# Patient Record
Sex: Male | Born: 1935 | Race: White | Hispanic: No | Marital: Married | Smoking: Never smoker
Health system: Southern US, Community
[De-identification: ages and names within clinical notes are randomized; demographics above are authoritative.]

## PROBLEM LIST (undated history)

## (undated) DIAGNOSIS — R7989 Other specified abnormal findings of blood chemistry: Secondary | ICD-10-CM

## (undated) DIAGNOSIS — I5030 Unspecified diastolic (congestive) heart failure: Secondary | ICD-10-CM

## (undated) DIAGNOSIS — D126 Benign neoplasm of colon, unspecified: Secondary | ICD-10-CM

## (undated) DIAGNOSIS — I509 Heart failure, unspecified: Secondary | ICD-10-CM

## (undated) DIAGNOSIS — N529 Male erectile dysfunction, unspecified: Secondary | ICD-10-CM

## (undated) DIAGNOSIS — E785 Hyperlipidemia, unspecified: Secondary | ICD-10-CM

## (undated) DIAGNOSIS — I10 Essential (primary) hypertension: Secondary | ICD-10-CM

## (undated) DIAGNOSIS — I5189 Other ill-defined heart diseases: Secondary | ICD-10-CM

## (undated) DIAGNOSIS — H269 Unspecified cataract: Secondary | ICD-10-CM

## (undated) DIAGNOSIS — K604 Rectal fistula, unspecified: Secondary | ICD-10-CM

## (undated) DIAGNOSIS — I499 Cardiac arrhythmia, unspecified: Secondary | ICD-10-CM

## (undated) DIAGNOSIS — I2721 Secondary pulmonary arterial hypertension: Secondary | ICD-10-CM

## (undated) DIAGNOSIS — I2510 Atherosclerotic heart disease of native coronary artery without angina pectoris: Secondary | ICD-10-CM

## (undated) DIAGNOSIS — Z794 Long term (current) use of insulin: Secondary | ICD-10-CM

## (undated) DIAGNOSIS — R0600 Dyspnea, unspecified: Secondary | ICD-10-CM

## (undated) DIAGNOSIS — J189 Pneumonia, unspecified organism: Secondary | ICD-10-CM

## (undated) DIAGNOSIS — M1990 Unspecified osteoarthritis, unspecified site: Secondary | ICD-10-CM

## (undated) DIAGNOSIS — N400 Enlarged prostate without lower urinary tract symptoms: Secondary | ICD-10-CM

## (undated) DIAGNOSIS — C801 Malignant (primary) neoplasm, unspecified: Secondary | ICD-10-CM

## (undated) DIAGNOSIS — R42 Dizziness and giddiness: Secondary | ICD-10-CM

## (undated) DIAGNOSIS — E119 Type 2 diabetes mellitus without complications: Secondary | ICD-10-CM

## (undated) DIAGNOSIS — U071 COVID-19: Secondary | ICD-10-CM

## (undated) DIAGNOSIS — K8050 Calculus of bile duct without cholangitis or cholecystitis without obstruction: Secondary | ICD-10-CM

## (undated) DIAGNOSIS — K219 Gastro-esophageal reflux disease without esophagitis: Secondary | ICD-10-CM

## (undated) HISTORY — DX: Other ill-defined heart diseases: I51.89

## (undated) HISTORY — DX: Hyperlipidemia, unspecified: E78.5

## (undated) HISTORY — DX: Benign neoplasm of colon, unspecified: D12.6

## (undated) HISTORY — DX: Unspecified diastolic (congestive) heart failure: I50.30

## (undated) HISTORY — DX: COVID-19: U07.1

## (undated) HISTORY — DX: Gastro-esophageal reflux disease without esophagitis: K21.9

## (undated) HISTORY — DX: Rectal fistula, unspecified: K60.40

## (undated) HISTORY — DX: Type 2 diabetes mellitus without complications: E11.9

## (undated) HISTORY — DX: Male erectile dysfunction, unspecified: N52.9

## (undated) HISTORY — PX: EYE SURGERY: SHX253

## (undated) HISTORY — DX: Rectal fistula: K60.4

## (undated) HISTORY — DX: Calculus of bile duct without cholangitis or cholecystitis without obstruction: K80.50

## (undated) HISTORY — DX: Long term (current) use of insulin: Z79.4

## (undated) HISTORY — DX: Unspecified cataract: H26.9

## (undated) HISTORY — DX: Essential (primary) hypertension: I10

## (undated) HISTORY — PX: BACK SURGERY: SHX140

## (undated) HISTORY — PX: COLONOSCOPY W/ POLYPECTOMY: SHX1380

## (undated) HISTORY — DX: Atherosclerotic heart disease of native coronary artery without angina pectoris: I25.10

## (undated) HISTORY — DX: Secondary pulmonary arterial hypertension: I27.21

## (undated) HISTORY — DX: Other specified abnormal findings of blood chemistry: R79.89

## (undated) HISTORY — DX: Unspecified osteoarthritis, unspecified site: M19.90

---

## 1987-03-08 HISTORY — PX: LUMBAR LAMINECTOMY: SHX95

## 1998-03-07 DIAGNOSIS — IMO0001 Reserved for inherently not codable concepts without codable children: Secondary | ICD-10-CM

## 1998-03-07 HISTORY — DX: Reserved for inherently not codable concepts without codable children: IMO0001

## 2005-01-05 ENCOUNTER — Inpatient Hospital Stay (HOSPITAL_COMMUNITY): Admission: RE | Admit: 2005-01-05 | Discharge: 2005-01-18 | Payer: Self-pay | Admitting: *Deleted

## 2005-03-07 HISTORY — PX: CORONARY ARTERY BYPASS GRAFT: SHX141

## 2006-05-23 ENCOUNTER — Ambulatory Visit: Payer: Self-pay

## 2006-08-28 ENCOUNTER — Ambulatory Visit: Payer: Self-pay | Admitting: Otolaryngology

## 2007-05-24 ENCOUNTER — Encounter: Payer: Self-pay | Admitting: Cardiovascular Disease

## 2007-05-24 ENCOUNTER — Ambulatory Visit (HOSPITAL_COMMUNITY): Admission: RE | Admit: 2007-05-24 | Discharge: 2007-05-24 | Payer: Self-pay | Admitting: Cardiovascular Disease

## 2007-07-05 ENCOUNTER — Encounter: Payer: Self-pay | Admitting: Cardiovascular Disease

## 2007-07-24 ENCOUNTER — Ambulatory Visit: Payer: Self-pay | Admitting: Cardiovascular Disease

## 2007-11-02 ENCOUNTER — Ambulatory Visit: Payer: Self-pay | Admitting: Cardiology

## 2008-02-07 ENCOUNTER — Encounter: Payer: Self-pay | Admitting: Cardiovascular Disease

## 2008-10-05 HISTORY — PX: JOINT REPLACEMENT: SHX530

## 2009-03-20 ENCOUNTER — Encounter: Payer: Self-pay | Admitting: Cardiovascular Disease

## 2009-09-09 ENCOUNTER — Encounter: Payer: Self-pay | Admitting: Cardiovascular Disease

## 2009-09-15 ENCOUNTER — Ambulatory Visit: Payer: Self-pay | Admitting: Cardiovascular Disease

## 2009-09-15 DIAGNOSIS — E785 Hyperlipidemia, unspecified: Secondary | ICD-10-CM | POA: Insufficient documentation

## 2009-09-15 DIAGNOSIS — I10 Essential (primary) hypertension: Secondary | ICD-10-CM | POA: Insufficient documentation

## 2009-09-15 DIAGNOSIS — I2510 Atherosclerotic heart disease of native coronary artery without angina pectoris: Secondary | ICD-10-CM | POA: Insufficient documentation

## 2009-09-22 ENCOUNTER — Telehealth: Payer: Self-pay | Admitting: Cardiovascular Disease

## 2009-09-23 ENCOUNTER — Telehealth: Payer: Self-pay | Admitting: Cardiovascular Disease

## 2010-03-07 HISTORY — PX: FOOT SURGERY: SHX648

## 2010-03-22 ENCOUNTER — Ambulatory Visit
Admission: RE | Admit: 2010-03-22 | Discharge: 2010-03-22 | Payer: Self-pay | Source: Home / Self Care | Attending: Cardiovascular Disease | Admitting: Cardiovascular Disease

## 2010-03-22 ENCOUNTER — Encounter: Payer: Self-pay | Admitting: Cardiovascular Disease

## 2010-03-27 ENCOUNTER — Encounter: Payer: Self-pay | Admitting: Surgery

## 2010-03-28 ENCOUNTER — Encounter: Payer: Self-pay | Admitting: *Deleted

## 2010-04-02 ENCOUNTER — Ambulatory Visit: Payer: Self-pay

## 2010-04-07 NOTE — Assessment & Plan Note (Signed)
Summary: NEW PT   Visit Type:  Initial Consult Primary Provider:  Nada Maclachlan.  CC:  c/o shortness of breath with little exertion.[name] [name]  History of Present Illness: 77 year old gentleman, known to me from [facility], with a history of coronary artery disease, bypass surgery in November 2006, history of diabetes, hyperlipidemia, hypertension, erectile dysfunction who presents to reestablish care.  He was last seen by myself in January 2011. At that time he was doing well. He reports that he continues to be happy, has no complaints. He denies any chest pain. He does report having some shortness of breath when he walks up hills and stairs. His fianc who is with him states that he does not do any exercise and sits around too much. He states that he likes to bike. His sugars have been better controlled since he started insulin subcutaneous.  Echocardiogram April 2009 shows normal systolic function with mild LVH, diastolic relaxation abnormality, mildly enlarged left atrium, mild aortic insufficiency.  Cardiac CT scan in March 2009 showed severe coronary artery disease with patent grafts x4, saphenous vein graft to the PDA, OM, diagonal and a LIMA graft to the LAD.  Labs from May 2011 shows total cholesterol 138, LDL 69, HDL 32, hemoglobin A1c 6.6  EKG shows normal sinus rhythm with rate 56 beats per minute, no significant ST or T wave changes.  Preventive Screening-Counseling & Management  Alcohol-Tobacco     Smoking Status: never  Caffeine-Diet-Exercise     Does Patient Exercise: no      Drug Use:  yes.    Current Medications (verified): 1)  Diclofenac Sodium 75 Mg Tbec (Diclofenac Sodium) .[name] [name].. 1 Two Times A Day 2)  Ramipril 10 Mg Caps (Ramipril) .[name] [name].. 1 Once Daily 3)  Omeprazole 20 Mg Cpdr (Omeprazole) .[name] [name].. 1 Once Daily 4)  Simvastatin 80 Mg Tabs (Simvastatin) .[name] [name].. 1 At Bedtime 5)  Carvedilol 12.5 Mg Tabs (Carvedilol) .[name] [name].. 1 Two Times A Day 6)   Aspir-Low 81 Mg Tbec (Aspirin) .... Two Tablets Once Daily 7)  Vitamin C 8)  Vitamin D 1000mg  .... 1 Once Daily 9)  Fish Oil 1000 Mg Caps (Omega-3 Fatty Acids) .[name] [name].. 1 Once Daily 10)  Osteo Bi-Flex Regular Strength 250-200 Mg Tabs (Glucosamine-Chondroitin) .[name] [name].. 1 Once Daily 11)  Centrum Silver  Tabs (Multiple Vitamins-Minerals) .[name] [name].. 1 Once Daily 12)  Saw Palmetto 450 Mg Caps (Saw Palmetto (Serenoa Repens)) .[name] [name].. 1 Once Daily  Allergies (verified): No Known Drug Allergies  Past History:  Family History: Last updated: 09/15/2009 Family History of Coronary Artery Disease:  Family History of Cancer:   Social History: Last updated: 09/15/2009 Part Time  Single  Tobacco Use - No.  Alcohol Use - yes Drug Use - no Drug Use - yes  Risk Factors: Exercise: no (09/15/2009)  Risk Factors: Smoking Status: never (09/15/2009)  Past Medical History: CAD Hyperlipidemia Hypertension  Past Surgical History: UEA:VWUJ x 4: 2007  Family History: Family History of Coronary Artery Disease:  Family History of Cancer:   Social History: Part Time  Single  Tobacco Use - No.  Alcohol Use - yes Drug Use - no Drug Use - yes Smoking Status:  never Does Patient Exercise:  no Drug Use:  yes  Review of Systems       The patient complains of shortness of breath.  The patient denies fever, weight gain/loss, vision loss, decreased hearing, hoarseness, chest pain, palpitations, prolonged cough, abdominal pain, incontinence, muscle weakness, depression, and enlarged lymph nodes.  Vital Signs:  Patient profile:   77 year old male Height:      74 inches Weight:      226 pounds BMI:     29.12 Pulse rate:   58 / minute BP sitting:   181 / 81  (left arm) Cuff size:   large  Vitals Entered By: Bishop Dublin, CMA (September 15, 2009 3:17 PM)  Physical Exam  General:  Well developed, well nourished, in no acute distress.Moderate obesity Head:  normocephalic and atraumatic Neck:  Neck supple, no  JVD. No masses, thyromegaly or abnormal cervical nodes. Lungs:  Clear bilaterally to auscultation and percussion. Heart:  Non-displaced PMI, chest non-tender; regular rate and rhythm, S1, S2 without murmurs, rubs or gallops. Carotid upstroke normal, no bruit.  Pedals normal pulses. No edema, no varicosities. Abdomen:  Bowel sounds positive; abdomen soft and non-tender without masses,  Msk:  Back normal, normal gait. Muscle strength and tone normal. Neurologic:  Alert and oriented x 3. Skin:  Intact without lesions or rashes. Psych:  Normal affect.   Impression & Recommendations:  Problem # 1:  CAD (ICD-414.00) history of severe coronary artery disease with bypass grafting. Asymptomatic over the past 5 years. He does have some shortness of breath with exertion and I suspect this is due to being very deconditioned. Rest and increase his biking. He does have bad feet and is unable to walk her treadmill for long distances.  His updated medication list for this problem includes:    Ramipril 10 Mg Caps (Ramipril) .[name] [name]... 1 once daily    Carvedilol 12.5 Mg Tabs (Carvedilol) .[name] [name]... 1 two times a day    Aspir-low 81 Mg Tbec (Aspirin) .[name] [name]..[name] [name] Two tablets once daily  Orders: EKG w/ Interpretation (93000)  Problem # 2:  HYPERLIPIDEMIA-MIXED (ICD-272.4) cholesterol is well controlled on his current dose of simvastatin 40 mg daily. We have made no changes.  His updated medication list for this problem includes:    Simvastatin 40 Mg Tabs (Simvastatin) .[name] [name]... Take 1 tablet by mouth once a day  Problem # 3:  HYPERTENSION, BENIGN (ICD-401.1) we'll continue to watch his blood pressure. It did come down on repeat check today to 144/70. We has not changed his medicines at this time. He will buy a blood pressure cuff to monitor his blood pressure at home.  His updated medication list for this problem includes:    Ramipril 10 Mg Caps (Ramipril) .[name] [name]... 1 once daily    Carvedilol 12.5 Mg Tabs (Carvedilol)  .[name] [name]... 1 two times a day    Aspir-low 81 Mg Tbec (Aspirin) .[name] [name]..[name] [name] Two tablets once daily  Patient Instructions: 1)  Your physician has recommended you make the following change in your medication: Decrease simvastatin 40mg  daily 2)  Your physician wants you to follow-up in:   6 months You will receive a reminder letter in the mail two months in advance. If you don't receive a letter, please call our office to schedule the follow-up appointment. Prescriptions: SIMVASTATIN 40 MG TABS (SIMVASTATIN) Take 1 tablet by mouth once a day  #30 x 12   Entered by:   Benedict Needy, RN   Authorized by:   Dossie Arbour MD   Signed by:   Benedict Needy, RN on 09/15/2009   Method used:   Electronically to        [facility] [address] [phone]* (retail)       [address]       [place], [place]  [address]  Ph: [phone]       Fax: [phone]   RxID:   [id number] CARVEDILOL 12.5 MG TABS (CARVEDILOL) 1 two times a day  #60 x 12   Entered by:   Benedict Needy, RN   Authorized by:   Dossie Arbour MD   Signed by:   Benedict Needy, RN on 09/15/2009   Method used:   Electronically to        [facility] [address] (retail)       [address]       [place], [place]  [address]       Ph: [phone]       Fax: [phone]   RxID:   [id number]

## 2010-04-07 NOTE — Progress Notes (Signed)
Summary: pre op  ---- Converted from flag ---- ---- 09/23/2009 9:29 AM, [name] Needy, RN wrote: pt having prostate surgery @ [facility] seen in office 7/12 can he be cleared for surgery. ------------------------------  Appended Document: pre op He can be cleared for prostate surgery based on recent visit and exam. [name]

## 2010-04-07 NOTE — Letter (Signed)
Summary: External Correspondence  External Correspondence   Imported By: West Carbo 09/09/2009 13:57:51  _____________________________________________________________________  External Attachment:    Type:   Image     Comment:   External Document

## 2010-04-07 NOTE — Progress Notes (Signed)
Summary: SURGICAL CLEARANCE  Phone Note Call from Patient Call back at Home Phone [phone]   Caller: Patient Call For: [facility] Summary of Call: PATIENT CALLED AND IS HAVING PROSTATE SURGERY AT [facility] BY DR. Mordecai Rasmussen.  HE NEEDS SURGICAL CLEARANCE FROM DR. Karsynn Deweese.  PLEASE FAX CLEARANCE TO [facility]. [facility] AT [phone] Initial call taken by: West Carbo,  September 22, 2009 12:11 PM  Follow-up for Phone Call        clearance faxed  Follow-up by: Benedict Needy, RN,  September 23, 2009 10:00 AM

## 2010-04-07 NOTE — Letter (Signed)
Summary: Historic Patient File  Historic Patient File   Imported By: West Carbo 09/16/2009 08:39:53  _____________________________________________________________________  External Attachment:    Type:   Image     Comment:   External Document

## 2010-04-07 NOTE — Letter (Signed)
Summary: Historic Patient File  Historic Patient File   Imported By: West Carbo 09/15/2009 10:37:56  _____________________________________________________________________  External Attachment:    Type:   Image     Comment:   External Document

## 2010-04-08 NOTE — Assessment & Plan Note (Signed)
Summary: F/U 6 MONTHS   Visit Type:  Follow-up Primary Provider:  Nada Maclachlan.  CC:  c/o shortness of breath with exertion.Marland Kitchen  History of Present Illness: 77 year old gentleman, known to me from [facility], with a history of coronary artery disease, bypass surgery in November 2006, history of diabetes, hyperlipidemia, hypertension, erectile dysfunction who presents to reestablish care.  He reports that he continues to be happy, has no complaints. He denies any chest pain. He does have some very mild joint discomfort and is concerned this could be secondary to statin. He has been moving recently and has been very active. He is trying to get back to his biking. No edema, no significant shortness of breath, no lightheadedness or dizziness.  repeat blood pressure is 137/70. His sugars have been elevated recently despite eating a lot less. He is due to followup with his endocrinologist.  Echocardiogram April 2009 shows normal systolic function with mild LVH, diastolic relaxation abnormality, mildly enlarged left atrium, mild aortic insufficiency.  Cardiac CT scan in March 2009 showed severe coronary artery disease with patent grafts x4, saphenous vein graft to the PDA, OM, diagonal and a LIMA graft to the LAD.  Labs from May 2011 shows total cholesterol 138, LDL 69, HDL 32, hemoglobin A1c 6.6  EKG shows normal sinus rhythm with rate 62 beats per minute, no significant ST or T wave changes.  Current Medications (verified): 1)  Diclofenac Sodium 75 Mg Tbec (Diclofenac Sodium) .Marland Kitchen.. 1 Two Times A Day 2)  Ramipril 10 Mg Caps (Ramipril) .Marland Kitchen.. 1 Once Daily 3)  Omeprazole 20 Mg Cpdr (Omeprazole) .Marland Kitchen.. 1 Once Daily 4)  Simvastatin 40 Mg Tabs (Simvastatin) .... Take 1 Tablet By Mouth Once A Day 5)  Carvedilol 12.5 Mg Tabs (Carvedilol) .Marland Kitchen.. 1 Two Times A Day 6)  Aspir-Low 81 Mg Tbec (Aspirin) .... Two Tablets Once Daily 7)  Vitamin C 8)  Vitamin D3 2000 Unit Caps  (Cholecalciferol) .... One Tablet Once Daily 9)  Fish Oil 1000 Mg Caps (Omega-3 Fatty Acids) .Marland Kitchen.. 1 Once Daily 10)  Osteo Bi-Flex Regular Strength 250-200 Mg Tabs (Glucosamine-Chondroitin) .Marland Kitchen.. 1 Once Daily 11)  Centrum Silver  Tabs (Multiple Vitamins-Minerals) .Marland Kitchen.. 1 Once Daily 12)  Glimepiride 1 Mg Tabs (Glimepiride) .... One Tablet Once Daily 13)  Celebrex 200 Mg Caps (Celecoxib) .... One Tablet Once Daily 14)  Finasteride 5 Mg Tabs (Finasteride) .... One Tablet Once Daily 15)  Levemir 100 Unit/ml Soln (Insulin Detemir) .... 26 Units 16)  Oxybutynin Chloride 5 Mg Tabs (Oxybutynin Chloride) .... One Tablet Qd  Allergies (verified): No Known Drug Allergies  Past History:  Past Medical History: Last updated: 09/15/2009 CAD Hyperlipidemia Hypertension  Past Surgical History: Last updated: 09/15/2009 ZOX:WRUE x 4: 2007  Family History: Last updated: 09/15/2009 Family History of Coronary Artery Disease:  Family History of Cancer:   Social History: Last updated: 09/15/2009 Part Time  Single  Tobacco Use - No.  Alcohol Use - yes Drug Use - no Drug Use - yes  Risk Factors: Exercise: no (09/15/2009)  Risk Factors: Smoking Status: never (09/15/2009)  Review of Systems  The patient denies fever, weight loss, weight gain, vision loss, decreased hearing, hoarseness, chest pain, syncope, dyspnea on exertion, peripheral edema, prolonged cough, abdominal pain, incontinence, muscle weakness, depression, and enlarged lymph nodes.    Vital Signs:  Patient profile:   77 year old male Height:      74 inches Weight:      218 pounds BMI:  28.09 Pulse rate:   61 / minute BP sitting:   157 / 74  (left arm) Cuff size:   large  Vitals Entered By: Lysbeth Galas CMA (March 22, 2010 2:26 PM)  Physical Exam  General:  Well developed, well nourished, in no acute distress.Moderate obesity Head:  normocephalic and atraumatic Neck:  Neck supple, no JVD. No masses, thyromegaly or  abnormal cervical nodes. Lungs:  Clear bilaterally to auscultation and percussion. Heart:  Non-displaced PMI, chest non-tender; regular rate and rhythm, S1, S2 without murmurs, rubs or gallops. Carotid upstroke normal, no bruit.  Pedals normal pulses. No edema, no varicosities. Abdomen:  Bowel sounds positive; abdomen soft and non-tender without masses,  Msk:  Back normal, normal gait. Muscle strength and tone normal. Pulses:  pulses normal in all 4 extremities Extremities:  No clubbing or cyanosis. Neurologic:  Alert and oriented x 3. Skin:  Intact without lesions or rashes. Psych:  Normal affect.   Impression & Recommendations:  Problem # 1:  CAD (ICD-414.00) no symptoms of angina. We will continue aggressive medical management. We have suggested that we check his cholesterol in several months time.  His updated medication list for this problem includes:    Ramipril 10 Mg Caps (Ramipril) .Marland Kitchen... 1 once daily    Carvedilol 12.5 Mg Tabs (Carvedilol) .Marland Kitchen... 1 tablet two times a day    Aspir-low 81 Mg Tbec (Aspirin) .Marland Kitchen..Marland Kitchen Two tablets once daily  Orders: EKG w/ Interpretation (93[address])  Problem # 2:  HYPERLIPIDEMIA-MIXED (ICD-272.4) He was previously at goal LDL less than 70 in May 2011. Continue simvastatin for now. Encouraged weight loss and exercise.  His updated medication list for this problem includes:    Simvastatin 40 Mg Tabs (Simvastatin) .Marland Kitchen... Take 1 tablet by mouth once a day  Problem # 3:  HYPERTENSION, BENIGN (ICD-401.1) Blood pressure did improve to a reasonable level on recheck. No further medication changes made at this time. Encouraged weight loss and exercise.  His updated medication list for this problem includes:    Ramipril 10 Mg Caps (Ramipril) .Marland Kitchen... 1 once daily    Carvedilol 12.5 Mg Tabs (Carvedilol) .Marland Kitchen... 1 tablet two times a day    Aspir-low 81 Mg Tbec (Aspirin) .Marland Kitchen..Marland Kitchen Two tablets once daily  Patient Instructions: 1)  Your physician recommends that you  schedule a follow-up appointment in: 6 months 2)  TO BE SCHEDULED 05/2010: Your physician recommends that you return for a FASTING lipid profile (Lipid/LFT) 3)  Your physician recommends that you continue on your current medications as directed. Please refer to the Current Medication list given to you today. Prescriptions: RAMIPRIL 10 MG CAPS (RAMIPRIL) 1 once daily  #90 x 4   Entered by:   Lanny Hurst RN   Authorized by:   Dossie Arbour MD   Signed by:   Lanny Hurst RN on 03/22/2010   Method used:   Electronically to        [facility] [phone]* (retail)       [address] [place], [place]  [address]       Ph: [phone] or [phone]       Fax: [phone]   RxID:   [id number] SIMVASTATIN 40 MG TABS (SIMVASTATIN) Take 1 tablet by mouth once a day  #90 x 4   Entered by:   Lanny Hurst RN   Authorized by:   Dossie Arbour MD   Signed  by:   Lanny Hurst RN on 03/22/2010   Method used:   Electronically to        [facility] [phone]* (retail)       [address] [place], [place]  [address]       Ph: [phone] or [phone]       Fax: [phone]   RxID:   [id number] CARVEDILOL 12.5 MG TABS (CARVEDILOL) 1 tablet two times a day  #90 x 4   Entered by:   Lanny Hurst RN   Authorized by:   Dossie Arbour MD   Signed by:   Lanny Hurst RN on 03/22/2010   Method used:   Electronically to        [facility] [phone]* (retail)       [address] [place], [place]  [address]       Ph: [phone] or [phone]       Fax: [phone]   RxID:   [id number]

## 2010-04-21 ENCOUNTER — Telehealth: Payer: Self-pay | Admitting: Cardiovascular Disease

## 2010-04-28 NOTE — Progress Notes (Signed)
Summary: Cardiac Clearance  Phone Note Other Incoming   Caller: Orthopedics Elease Hashimoto) Summary of Call: Orthopedics called asking if pt can have cardiac clearance for foot surgery that is scheduled May 18, 2010. Pt was seen last month. Will fax them last EKG per request and will notify if he needs another f/u or if we can send clearance note. Phone: [phone] Fax:  [phone] Initial call taken by: Lanny Hurst RN,  April 21, 2010 5:01 PM  Follow-up for Phone Call        low risk for surgery. OK to give clearance     Appended Document: Cardiac Clearance Clearance faxed. /MES

## 2010-07-20 NOTE — Assessment & Plan Note (Signed)
[facility]                            CARDIOLOGY OFFICE NOTE   NAME:[name] [name], [name] [name]                     MRN:          [id number]  DATE:11/02/2007                            DOB:          Jul 09, 1933    I was asked by [name] [name] to consult on [name] [name] for his  cardiovascular problems since he is to become his new cardiologist.  He  has been a former patient of Dr. Lenise [name], who has moved.   HISTORY OF PRESENT ILLNESS:  [name] [name] is a 77 year old married white  male from [place] whose initial cardiovascular history dates back  into the early 2000s.  At that time, he had a PTCA and stenting of the  LAD and diagonal at [name].  He had a diagonal branch stented with  a drug-eluting stent in 2004.   He subsequently presented with progressive disease.  He underwent  coronary artery bypass grafting by Dr. Evelene [name] on January 12, 2005,  where he had a left internal mammary graft placed to the LAD, vein graft  to the diagonal, vein graft to the third obtuse marginal and the  circumflex, and vein graft to the distal right coronary artery.   His LV function at that time was preserved with 60% EF.   He has done well since that time.  He recently had some shortness of  breath, which he now attributes to Actos.  He had a CTA in March 2009,  which showed patent grafts with severe native disease.  He also had a CT  of the chest, which showed no aortic dissection or pulmonary embolus.   His shortness of breath is baseline, but improved.  He is now off Actos.  His blood sugars have been high.  In fact, they are averaging over 200  in the mornings.   His past medical history in addition to the above, he quit smoking in  1951.  He has 1-2 alcoholic beverages a day.  He rides his bike almost a  week daily.   He has had no other surgeries.   He has no known drug allergies.   His current meds,  1. Meloxicam 7.5 mg a day.  2.  Omeprazole 20 mg a day.  3. Carvedilol 12.5 b.i.d.  4. Plavix 75 mg a day.  5. Ramipril 10 mg a day.  6. Prandin 2 mg t.i.d.  7. Simvastatin 40 mg at bedtime.  8. Avodart 0.5 daily.  9. Multivitamin.  10.Enteric-coated aspirin 81 mg a day.  11.Vitamin C.  12.Glucosamine.  13.Fish oil 2400 mg a day.   Family history is remarkable for his father dying of heart attack at age  60.   SOCIAL HISTORY:  He is retired.  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  He is married and has 4 children.   REVIEW OF SYSTEMS:  He has a history of gastroesophageal reflux, he has  some urinary tract problems with prostate issues, type 2 diabetes of  course, and arthritis.  Rest of the review of systems are negative.   PHYSICAL  EXAMINATION:  GENERAL:  On his exam today, he is a well-  developed, well-nourished overweight male in no acute distress.  VITAL SIGNS:  Blood pressure 124/70, his pulse is 67 and regular, and he  is 6 feet 1 inches, and weighs 223 pounds.  HEENT:  Normocephalic and atraumatic.  PERRLA.  Extraocular movements  intact.  Sclerae clear.  Facial symmetry is normal.  Carotids upstrokes  are equal bilaterally without bruits.  No JVD.  Thyroid is not enlarged.  Trachea is midline.  LUNGS:  Clear to auscultation and percussion.  HEART:  Reveals a poorly appreciated PMI and soft S1 and S2.  No gallop,  rub, or murmur.  Sternotomy site is stable.  ABDOMEN:  Protuberant.  Good bowel sounds.  There is no obvious  pulsatile mass.  Organomegaly was not present.  EXTREMITIES:  No cyanosis, clubbing, or edema.  He has some varicose  veins.  There is no sign of DVT.  Pulses are brisk.  NEURO:  Exam is intact.  SKIN:  Shows few ecchymoses.  MUSCULOSKELETAL:  No gross deformities.  No effusions.   His EKG shows normal sinus rhythm with nonspecific ST-segment changes.   ASSESSMENT/PLAN:  [name] [name] is doing well.  I am very concerned about  his blood sugar not being under control  and I have gone over this at  length today with him.  I have recommended he try to lose about 20  pounds and to follow with [name] [name] about adding another agent  perhaps.  I discouraged his assumption that he could exercise more and  lose weight.   I am also not sure if there is any indication for Plavix at this time.  His drug-eluting stents were put in 2004 plus he has also had bypass  surgery of the diagonals and LAD stents.   He is not sure if he wants to stop the Plavix.  I think, there is no  reason to continue it.   I will plan on seeing him back in 6 months.     [name] C. Daleen Squibb, MD, [facility]  Electronically Signed    TCW/MedQ  DD: 11/02/2007  DT: 11/02/2007  Job #: [id number]   cc:   Julieanne [name]

## 2010-07-23 NOTE — Op Note (Signed)
NAME:  ELIS, SAUBER NO.:  [id number]   MEDICAL RECORD NO.:  [id number]          PATIENT TYPE:  INP   LOCATION:  2399                         FACILITY:  [facility]   PHYSICIAN:  Larina Earthly, M.D.    DATE OF BIRTH:  March 14, 1933   DATE OF PROCEDURE:  01/12/2005  DATE OF DISCHARGE:                                 OPERATIVE REPORT   PREOPERATIVE DIAGNOSIS:  Right femoral false aneurysm.   POSTOP DIAGNOSIS:  Right femoral false aneurysm.   PROCEDURE:  Repair right femoral false aneurysm.   SURGEON:  Larina Earthly, M.D.   ASSISTANT:  Zadie Rhine, PA-C   ANESTHESIA:  General tracheal.   COMPLICATIONS:  None.   DISPOSITION:  The patient will then undergo coronary bypass grafting by Dr.  Evelene Croon, which be dictated as a separate note.   PROCEDURE IN DETAIL:  The patient was taken to the operating room, placed  supine position where the area of both groins, both legs and chest were  prepped for coronary bypass grafting.  Incision was made above the inguinal  ligament on the right and carried down through the subcutaneous tissue to  the level of the femoral artery. The common femoral artery was controlled  proximally and the false aneurysm was entered. The false aneurysm was  controlled with digital pressure and the superficial femoral artery was  exposed distal to the false aneurysm puncture site. The patient had a single  puncture at the junction of the superficial femoral and common femoral  artery. This was initially controlled with digital pressure, then was  controlled with a single 5-0 Prolene figure-of-eight suture. The wound was  copiously irrigated and hemostasis was obtained with cautery.  A flat Blake  drain was brought through separate puncture site and was secured to the skin  with 3-0 nylon stitch. The drain was cut to appropriate length was placed  the level of the femoral artery. The wound was then closed with several  layers of 2-0 Vicryl in  the subcu tissue and skin was closed with 3-0  subcuticular Vicrylk stitch. Sterile dressings applied. The patient was  taken to recovery room in stable condition.      Larina Earthly, M.D.  Electronically Signed     TFE/MEDQ  D:  01/12/2005  T:  01/12/2005  Job:  [id number]

## 2010-07-23 NOTE — Consult Note (Signed)
NAME:  [name] [name], [name] [name] NO.:  [id number]   MEDICAL RECORD NO.:  [id number]          PATIENT TYPE:  INP   LOCATION:  2030                         FACILITY:  [facility]   PHYSICIAN:  Di Kindle. Edilia Bo, M.D.DATE OF BIRTH:  1933/06/16   DATE OF CONSULTATION:  01/11/2005  DATE OF DISCHARGE:                                   CONSULTATION   REASON FOR CONSULTATION:  Right femoral false aneurysm.   HISTORY:  This is a pleasant 77 year old gentleman with a history of  coronary artery disease, status post previous PTCA and stenting in [place] in December 2002.  He had presented with increasing dyspnea on exertion  and was brought in for cardiac catheterization -- which was done on January 05, 2005 via a right femoral approach. He was seen by Dr. Laneta Simmers and  scheduled for coronary revascularization. On exam, he was noted to have some  ecchymosis in the right groin and a bruit was heard on examination. Vascular  surgery was consulted after a duplex scan showed evidence of a femoral pulse  aneurysm on the right with no evidence of AV fistula.   PAST MEDICAL HISTORY:  Significant for coronary artery disease. In addition,  he has a history of hypertension, type 2 diabetes. He had smoked cigars in  the past, but quit 10 years ago. He has no history of hyperlipidemia that he  is aware of. He has no history of renal insufficiency or peripheral vascular  disease that he is aware of. On review of systems, he has had no  claudication, rest pain or nonhealing wounds. He has had no history of  stroke, TIAs or amaurosis fugax.   PHYSICAL EXAMINATION:  VITAL SIGNS:  Blood pressure 131/72, heart rate 76.  ABDOMEN:  Soft and nontender. He has a pulsatile mass in the right groin,  approximately 3 cm in diameter, consistent with a partially thrombosed  pseudoaneurysm or hematoma. he has palpable pedal pulses bilaterally. He has  no significant lower extremity swelling. He has ecchymosis  of the right  groin. He does have a bruit in the right groin.   DIAGNOSTIC TESTING:  Duplex scan shows a pseudoaneurysm originating from the  right femoral artery, irregular and partially thrombosed.   IMPRESSION:  This patient has a right femoral artery pseudoaneurysm. Given  that he is to undergo open heart surgery tomorrow, I have recommended that  the aneurysm be fixed just prior to his open heart surgery, under the same  anesthetic. We have discussed the indications of the procedure and the  potential complications; including the risk of wound healing problems. All  of his questions were answered and he is agreeable to proceed.  The  surgeries has been scheduled for Dr. Arbie Cookey to perform tomorrow, just prior  to his open heart surgery.      Di Kindle. Edilia Bo, M.D.  Electronically Signed     CSD/MEDQ  D:  01/11/2005  T:  01/12/2005  Job:  [id number]

## 2010-07-23 NOTE — Cardiovascular Report (Signed)
NAME:  [name] [name], [name] [name] NO.:  [id number]   MEDICAL RECORD NO.:  [id number]          PATIENT TYPE:  OIB   LOCATION:  6531                         FACILITY:  [facility]   PHYSICIAN:  Darlin Priestly, MD  DATE OF BIRTH:  08-22-1933   DATE OF PROCEDURE:  01/05/2005  DATE OF DISCHARGE:                              CARDIAC CATHETERIZATION   PROCEDURE:  1.  Left heart catheterization.  2.  Coronary angiography.  3.  Left ventriculogram.  4.  Abdominal aortogram.   COMPLICATIONS:  None.   INDICATIONS FOR PROCEDURE:  [name] [name] is a 77 year old male patient of Dr.  Julieanne Manson in [place], with a history of CAD, status post PTCA and  stent of his LAD in [place] in December 2002, with diagonal stenting in  April 2004.  He did undergo a Cardiolite scan in April 2006, and found no  significant ischemia, with a normal EF.  Over the last several weeks, he has  been complaining of increasing dyspnea on exertion similar to his previous  angioplasty symptoms.  He is now brought for cardiac catheterization to re-  evaluate his CAD.   DESCRIPTION OF PROCEDURE:  After giving informed consent, the patient was  brought to the cardiac catheterization laboratory where the right and left  groin were shaved, prepped, and draped in the usual sterile fashion.  __________ was established.  Using the modified Seldinger technique, a #6  French arterial sheath in the right femoral artery.  A 6 French diagnostic  catheters were then used to perform diagnostic angiography.   The left main is a large vessel with no significant disease.   The LAD is a large vessel __________ two diagonal branch.  The LAD is noted  to have a stent in its proximal portion.  There is an 80 to 90% ostial LAD  lesion.  The proximal LAD stent appears to be patent.  There is 50% lesion  of the mid LAD after take-off of the second diagonal.  The remainder of the  LAD is irregular, but has no high-grade  stenosis.   The first diagonal is a small vessel which arises within the proximal LAD  stent.  There is 99% ostial lesion of this small vessel.  The second  diagonal is a medium-sized vessel with a stent noted in its proximal portion  and bifurcates distally.  The stent is patent, though there does appear to  be an 80 to 90% ostial lesion in the second diagonal.   The left circumflex is a large vessel that courses the AV groove and gives  off to two obtuse marginal branches.  The AV groove circumflex noted to have  40% early mid-stenosis.  The first OM is a small vessel with no significant  disease.   The second obtuse marginal is a large vessel that bifurcates into the  proximal segment with no significant disease.   The right coronary artery is a medium-sized vessel which is dominant  __________ posterolateral branch.  The RCA is noted to have a 60% proximal  and 30% distal stenosis.  The PD and posterolateral  branch are irregular,  but have no high-grade stenosis.   Left ventriculogram reveals preserved EF at 60%.   Abdominal aortogram __________ stenosis.   HEMODYNAMICS:  Systemic arterial pressure 155/80, LV pressure 155/12, LVP of  17.   CONCLUSION:  1.  Significant one-vessel coronary artery disease.  2.  Normal left ventricular systolic function.  3.  No evidence of renal artery stenosis.  4.  Systemic hypertension.      Darlin Priestly, MD  Electronically Signed     RHM/MEDQ  D:  01/05/2005  T:  01/05/2005  Job:  [id number]   cc:   Julieanne Manson  Fax: [phone]

## 2010-07-23 NOTE — Op Note (Signed)
NAME:  [name] [name], [name] [name] NO.:  [id number]   MEDICAL RECORD NO.:  [id number]          PATIENT TYPE:  INP   LOCATION:  2311                         FACILITY:  [facility]   PHYSICIAN:  Evelene Croon, M.D.     DATE OF BIRTH:  11-Jun-1933   DATE OF PROCEDURE:  01/12/2005  DATE OF DISCHARGE:                                 OPERATIVE REPORT   PREOPERATIVE DIAGNOSIS:  Severe three vessel coronary artery disease.   POSTOPERATIVE DIAGNOSIS:  Severe three vessel coronary artery disease.   OPERATIVE PROCEDURE:  Median sternotomy, extracorporeal circulation,  coronary bypass graft surgery x 4 using left internal mammary artery graft  to the left anterior descending coronary artery, saphenous vein graft to the  diagonal branch of the left anterior descending , saphenous vein graft to  the third obtuse marginal branch of the left circumflex coronary artery, and  a saphenous vein graft to the distal right coronary artery, endoscopic vein  harvesting from the left leg.   SURGEON:  Evelene Croon, M.D.   ASSISTANT:  Stephanie Acre Dominick, P.A.-C.   ANESTHESIA:  General endotracheal anesthesia.   CLINICAL HISTORY:  This patient is a 77 year old gentleman with a history of  coronary disease status post PTCA and stenting of the LAD and diagonal in  the past.  The LAD was stented in December 2002 at [facility] and the  diagonal branch was stented in April 2004 using a drug eluting stent and was  also performed at [facility].  He has done well until the past several  weeks when he has had increasing shortness of breath on exertion which was a  similar symptom that he had in the past prior to his angioplasties.  He  underwent repeat cardiac catheterization which showed severe three vessel  disease.  The LAD had 80% proximal stenosis and 50% mid vessel stenosis.  There was a 99% stenosis of a small first diagonal branch and 80% proximal  stenosis in a second diagonal branch which was the  larger vessel.  The left  circumflex had about 40% proximal stenosis.  The right coronary artery had  30% proximal and 30% distal stenosis.  Left ventricular function was well  preserved with an EF of about 60%.  After review of the angiogram and  examination of the patient, it was felt that coronary artery bypass graft  surgery would be the best treatment to prevent further ischemia and  infarction.  I discussed the operative procedure with the patient and his  wife and daughter including alternatives, benefits, and risks, including  bleeding, blood transfusion, infection, stroke, myocardial infarction, graft  failure, and death.  He understood and agreed to proceed.   OPERATIVE PROCEDURE:  The patient was taken to the operating room and placed  on the table in supine position.  After induction of general endotracheal  anesthesia, a Foley catheter was placed in the bladder using sterile  technique.  The chest, abdomen, and both lower extremities were prepped and  draped in the usual sterile manner.  The chest was entered through a median  sternotomy incision and  the pericardium opened in the midline.  Examination  of the heart showed good ventricular contractility.  The ascending aorta had  no palpable plaques in it.  Then, the left internal mammary artery was  harvested from the chest wall as a pedicle graft.  This was a medium caliber  vessel with excellent blood flow through it.  At the same time, a segment of  greater saphenous vein was harvested from the left leg using endoscopic vein  harvest technique.  This vein was of medium size and good quality.  The  patient was heparinized and when an adequate activated clotting time was  achieved, the distal ascending aorta was cannulated using a 20 French aortic  cannula for arterial in flow.  Venous outflow was achieved using two staged  venous cannula through the right atrial appendage.  An antegrade  cardioplegia and vent cannula was  inserted in the aortic root.   The patient was placed on cardiopulmonary bypass and the distal coronaries  were identified.  The LAD was intramyocardial in its proximal and mid  portions but was visible on the surface of the heart in its distal third.  The diagonal branch was a medium sized graftable vessel.  The first and  second marginals were relatively small vessels and the third marginal was a  large vessel.  All these vessels communicated on angiogram.  The right  coronary artery was heavily diseased in its proximal and mid portions, but  distally just before the take off of the posterior descending branch, it was  a fairly soft vessel.  Then, the aorta was crossclamped and 500 mL of cold  blood antegrade cardioplegia was administered in the aortic root with quick  arrest of the heart.  Systemic hypothermia at 28 degrees Centigrade and  topical hypothermia iced saline was used.  A temperature probe was placed in  the septum and insulating pad in the pericardium.   The first distal anastomosis was performed to the distal right coronary  artery.  The internal diameter was greater than 2.5 mm.  The conduit used  was a segment of greater saphenous vein.  The anastomosis was performed in  an end-to-side fashion using a continuous 7-0 Prolene suture.  Flow was  measured in the graft and was excellent.  The second distal anastomosis was  performed to the third marginal branch.  The internal diameter was 2 mm.  The conduit used was a second segment of greater saphenous vein.  The  anastomosis was performed in an end-to-side fashion using a continuous 7-0  Prolene suture.  Flow was measured through the graft and was excellent.  Then, another dose of cardioplegia was given down the vein graft and the  aortic root.  The third distal anastomosis was performed to the diagonal  branch.  The internal diameter was 1.6 mm.  The conduit used was the third segment of greater saphenous vein.  The  anastomosis was performed in an end-  to-side fashion using a continuous 7-0 Prolene suture.  Flow was measured  through the graft and was excellent.  The fourth distal anastomosis was  performed to the distal portion of the left anterior descending coronary  artery.  The internal diameter was about 1.6 mm in this area.  The conduit  used was the left internal mammary graft and this was brought through an  opening in the left pericardium anterior to the phrenic nerve.  It was  anastomosed to the LAD in an end-to-side manner using  a continuous 8-0  Prolene suture.  The pedicle was sutured to the epicardium with 6-0 Prolene  sutures.   The patient was rewarmed to 37 degrees Centigrade.  With the Cross clamp in  place, the three proximal vein graft anastomoses were performed to the  aortic root in an end-to-side manner using continuous 6-0 Prolene suture.  The clamp was then removed from the mammary pedicle.  There was rapid  warming of the ventricular septum and return of spontaneous ventricular  fibrillation.  The Cross clamp was removed with a time of 82 minutes and the  patient spontaneously converted to sinus rhythm.  The proximal and distal  anastomoses appeared hemostatic and the lie of the grafts were satisfactory.  Graft markers were placed around the proximal anastomoses.  Two temporary  right ventricular and right atrial pacing wires were placed and brought  through the skin.   When the patient had been rewarmed to 37 degrees Centigrade, he was weaned  from cardiopulmonary bypass on no inotropic agents.  Total bypass time 96  minutes.  Cardiac function appeared excellent with a cardiac output of 5  liters per minute.  Protamine was given and the venous and aortic cannulae  were removed without difficulty.  Hemostasis was achieved.  Three chest  tubes were placed, one in the posterior pericardium, one in the left pleural  space, and one in the anterior mediastinum.  An On-Q pain  pump was inserted  using two catheters which were placed percutaneously along the side of the  sternum on each side.  This was inserted without difficulty.  The system was  primed with 5 mL of Marcaine through each catheter and then connected to the  pump.  Then, the sternum was closed with #6 stainless steel wires.  The  fascia was closed with continuous #1 Vicryl suture.  The subcutaneous tissue  was closed with continuous 2-0 Vicryl and the skin with 3-0 Vicryl  subcuticular closure.  The lower extremity vein harvest site was closed in  layers in a similar manner.  The sponge, needle, and instrument counts were  correct  according to the scrub nurse.  Dry, sterile dressings were applied to the  incision and around the chest tubes which were hooked to Pleuravac suction.  The patient remained hemodynamically stable and was transferred to the SICU  in guarded but stable condition.      Evelene Croon, M.D.  Electronically Signed    BB/MEDQ  D:  01/12/2005  T:  01/12/2005  Job:  [id number]   cc:   Darlin Priestly, MD  Fax: [phone]

## 2010-07-23 NOTE — Discharge Summary (Signed)
NAME:  CARDER, YIN NO.:  [id number]   MEDICAL RECORD NO.:  [id number]          PATIENT TYPE:  INP   LOCATION:  2032                         FACILITY:  [facility]   PHYSICIAN:  Evelene Croon, M.D.     DATE OF BIRTH:  March 13, 1933   DATE OF ADMISSION:  01/05/2005  DATE OF DISCHARGE:                                 DISCHARGE SUMMARY   PRIMARY DIAGNOSIS:  Severe three-vessel coronary artery disease.   IN-HOSPITAL DIAGNOSES:  1.  Right femoral false aneurysm.  2.  Volume overload.   SECONDARY DIAGNOSES:  1.  Coronary artery disease, status post PTCA of the LAD in 2001.  PCI and      stenting of a second diagonal in 2004.  2.  Pericarditis in January 2006.  3.  Hypertension.  4.  Type 2 diabetes mellitus.  5.  Gastroesophageal reflux disease.  6.  Questionable spastic colon.  7.  History of gout.  8.  History of cholelithiasis.  9.  Benign prostatic hypertrophy.  10. Status post back surgery in 1980s.   ALLERGIES:  No known drug allergies.  The patient is intolerant to ACTOS,  causes lower extremity swelling, also intolerable to __________, which  causes nausea, METFORMIN causing diarrhea.   HOSPITAL OPERATIONS AND PROCEDURES:  1.  Cardiac catheterization with left heart catheterization, coronary      angiogram, left ventriculogram, abdominal aortogram.  2.  Coronary artery bypass grafting x4 using a left internal mammary artery      graft to left anterior descending coronary artery, saphenous vein graft      to the diagonal branch of the left anterior descending, saphenous vein      graft to the third obtuse marginal branch of the left circumflex      coronary artery, saphenous vein graft to the distal right coronary      artery.  Endoscopic vein harvesting from the left leg was done.  3.  Repair of right femoral false aneurysm.   HISTORY OF PRESENT ILLNESS:  The patient is a 77 year old gentleman with a  history of coronary artery disease which he is status  post PTCA and stenting  of the LAD and diagonal in the past.  Recently, the patient has been  experiencing increasing shortness of breath with exertion which is what he  experienced prior to undergoing PTCA.  The patient was brought in and  underwent cardiac catheterization by Dr. Jenne Campus on January 05, 2005.  This  showed LAD had an 80% proximal stenosis and 50% mid vessel stenosis.  There  is a 99% stenosis of a small first diagonal branch and 80% proximal stenosis  in the second diagonal branch.  Left circumflex showed a 40% proximal  stenosis.  RCA had 50% proximal distal stenosis.  EF was seen to be 60%.  Following cardiac catheterization, CVTS was consulted.  The patient was seen  and evaluated by Dr. Laneta Simmers.  Dr. Laneta Simmers discussed with the patient  regarding coronary artery bypass grafting.  He discussed the risks and  benefits of the procedure.  The patient acknowledged understanding and  wished to  proceed.  Surgery was scheduled for January 12, 2005.   HOSPITAL COURSE:  Preoperatively, the patient underwent bilateral carotid  duplex ultrasound showing no significant ICA stenosis.  Also preoperatively,  the patient developed right groin swelling.  An ultrasound of the right  groin was done to rule out pseudoaneurysm.  This was done on January 11, 2005, and this did show positive for a right femoral false aneurysm.  Following identification of the false aneurysm, Dr. Edilia Bo was consulted.  Dr. Edilia Bo evaluated this and discussed with Dr. Laneta Simmers performing repair  of the right femoral false aneurysm at the same time of CABG.  They agreed  and discussed with the patient.  The patient was in agreement.  Dr. Arbie Cookey  was planned to do the procedure.   The patient was taken to the operating room on January 12, 2005, where he  underwent coronary artery bypass grafting x4 using a left internal mammary  artery graft to left anterior descending coronary artery, saphenous vein  graft to  diagonal branch of the left anterior descending, saphenous vein  graft to the third obtuse marginal branch of the left circumflex coronary  artery, and a saphenous vein graft to the distal right coronary artery.  Endoscopic vein harvesting was done from the left leg.  The patient also  underwent repair of right femoral false aneurysm.  The patient tolerated the  procedure well, and was transferred up to the intensive care unit in stable  condition.  The evening of surgery, the patient seemed to be hemodynamically  stable.  He was extubated.  The patient's postoperative course was pretty  much unremarkable.  Chest tubes and lines were discontinued in normal  fashion.  The patient was transferred out to 2000 postoperative day #1.  He  remained in normal sinus rhythm during his hospital stay.  His incisions  were dry and intact and healing well.  His CBG's were controlled and  adjusted appropriately.  The patient remained hemodynamically stable.  He  did develop volume overload postoperatively and required diuretic.  He will  be discharged home on several days of Lasix.  The patient is out of bed  ambulating well.  He was able to be weaned from oxygen, saturating greater  than 90% on room air prior to discharge.   The patient is tentatively ready for discharge for January 16, 2005,  postoperative day #4.  A follow up appointment was scheduled with Dr. Laneta Simmers  for February 08, 2005, at 12:30 p.m.  The patient is to obtain a PA and  lateral chest x-ray one hour prior to this appointment.  The patient will  need to contact Dr. Mikey Bussing office to schedule follow up appointment with  him in two weeks.  Mr. Wiegman received instructions on diet, activity level,  incisional care.  He was told no driving until released to do so, and no  heavy lifting over 10 pounds.  He is told he can shower washing his  incisions using soap and water.  He is to contact the office if he develops any drainage, opening  from any of his incision sites, or if he has a fever  greater than 101.1 degrees Fahrenheit.  The patient was told to ambulate  three to four times per day as tolerated, and continue his breathing  exercises.  He was educated on diets that are low fat, low salt, and  continued diabetic diet.  He acknowledges understanding.   DISCHARGE MEDICATIONS:  1.  Toprol XL  25 mg p.o. daily.  2.  Zocor 10 mg p.o. q.h.s.  3.  Aspirin 325 mg p.o. daily.  4.  Avodart 0.5 mg daily.  5.  Prilosec OTC 20 mg daily.  6.  Flomax 0.4 mg p.o. daily.  7.  Metformin 1000 mg p.o. b.i.d.  8.  Lasix 40 mg p.o. daily x5 days.  9.  Potassium chloride 20 mEq p.o. daily x5 days.  10. Prandin 2 mg p.o. t.i.d.  11. Multivitamin daily.  12. ____________400 mg p.o. b.i.d.  13. Tylox one to two tabs p.o. q.4-6h. p.r.n. pain.      Theda [place], [place]      Evelene Croon, M.D.  Electronically Signed    KMD/MEDQ  D:  01/14/2005  T:  01/15/2005  Job:  [id number]   cc:   Darlin Priestly, MD  Fax: [phone]

## 2010-08-31 ENCOUNTER — Other Ambulatory Visit (INDEPENDENT_AMBULATORY_CARE_PROVIDER_SITE_OTHER): Payer: Medicare Other | Admitting: *Deleted

## 2010-08-31 DIAGNOSIS — E785 Hyperlipidemia, unspecified: Secondary | ICD-10-CM

## 2010-09-01 LAB — HEPATIC FUNCTION PANEL
ALT: 12 U/L (ref 0–53)
AST: 16 U/L (ref 0–37)
Albumin: 4.1 g/dL (ref 3.5–5.2)
Alkaline Phosphatase: 111 U/L (ref 39–117)
Indirect Bilirubin: 0.5 mg/dL (ref 0.0–0.9)
Total Bilirubin: 0.7 mg/dL (ref 0.3–1.2)
Total Protein: 5.9 g/dL — ABNORMAL LOW (ref 6.0–8.3)

## 2010-09-01 LAB — LIPID PANEL
HDL: 35 mg/dL — ABNORMAL LOW (ref >39)
LDL Cholesterol: 55 mg/dL (ref 0–99)
Total CHOL/HDL Ratio: 3.1 Ratio
Triglycerides: 95 mg/dL (ref <150)
VLDL: 19 mg/dL (ref 0–40)

## 2010-09-06 ENCOUNTER — Ambulatory Visit (INDEPENDENT_AMBULATORY_CARE_PROVIDER_SITE_OTHER): Payer: Medicare Other | Admitting: Cardiovascular Disease

## 2010-09-06 ENCOUNTER — Encounter: Payer: Self-pay | Admitting: Cardiovascular Disease

## 2010-09-06 DIAGNOSIS — E785 Hyperlipidemia, unspecified: Secondary | ICD-10-CM

## 2010-09-06 DIAGNOSIS — I2510 Atherosclerotic heart disease of native coronary artery without angina pectoris: Secondary | ICD-10-CM

## 2010-09-06 DIAGNOSIS — I10 Essential (primary) hypertension: Secondary | ICD-10-CM

## 2010-09-06 MED ORDER — CARVEDILOL 12.5 MG PO TABS: 12.5000 mg | ORAL_TABLET | Freq: Two times a day (BID) | ORAL | Status: DC

## 2010-09-06 MED ORDER — RAMIPRIL 10 MG PO CAPS: 10.0000 mg | ORAL_CAPSULE | Freq: Every day | ORAL | Status: DC

## 2010-09-06 MED ORDER — SIMVASTATIN 40 MG PO TABS: 40.0000 mg | ORAL_TABLET | Freq: Every day | ORAL | Status: DC

## 2010-09-06 NOTE — Progress Notes (Signed)
   Patient ID: [name] [name], male    DOB: [birth year], 77 y.o.   MRN: [id number]  HPI Comments: 77 year old gentleman with a history of coronary artery disease, bypass surgery in November 2006, history of diabetes, hyperlipidemia, hypertension, erectile dysfunction who presents 4 routine followup. He is leaving to take a trip to [place] for 3 months next week.   He reports that he continues to be happy, has no complaints. He denies any chest pain. He rides his bike on a regular basis. He did have recent foot surgery which has taken a long time to heal. He also reports having a hiatal hernia which occasionally gives him pain. Hiatal hernia pain comes on at rest, not with exertion No edema, no significant shortness of breath, no lightheadedness or dizziness.   Echocardiogram April 2009 shows normal systolic function with mild LVH, diastolic relaxation abnormality, mildly enlarged left atrium, mild aortic insufficiency.   Cardiac CT scan in March 2009 showed severe coronary artery disease with patent grafts x4, saphenous vein graft to the PDA, OM, diagonal and a LIMA graft to the LAD.   Labs from May 2011 shows total cholesterol 138, LDL 69, HDL 32, hemoglobin A1c 6.6   EKG shows normal sinus rhythm with rate 63 beats per minute, no significant ST or T wave changes.      Review of Systems  Constitutional: Negative.   HENT: Negative.   Eyes: Negative.   Respiratory: Negative.   Cardiovascular: Negative.   Gastrointestinal: Negative.   Musculoskeletal: Negative.        Joint pain in the foot following his surgery  Skin: Negative.   Neurological: Negative.   Hematological: Negative.   Psychiatric/Behavioral: Negative.   All other systems reviewed and are negative.    BP 120/72  Pulse 63  Ht 6\' 2"  (1.88 m)  Wt 216 lb (97.977 kg)  BMI 27.73 kg/m2  Physical Exam  Nursing note and vitals reviewed. Constitutional: He is oriented to person, place, and time. He appears well-developed  and well-nourished.       Mildly obese  HENT:  Head: Normocephalic.  Nose: Nose normal.  Mouth/Throat: Oropharynx is clear and moist.  Eyes: Conjunctivae are normal. Pupils are equal, round, and reactive to light.  Neck: Normal range of motion. Neck supple. No JVD present.  Cardiovascular: Normal rate, regular rhythm, S1 normal, S2 normal, normal heart sounds and intact distal pulses.  Exam reveals no gallop and no friction rub.   No murmur heard. Pulmonary/Chest: Effort normal and breath sounds normal. No respiratory distress. He has no wheezes. He has no rales. He exhibits no tenderness.  Abdominal: Soft. Bowel sounds are normal. He exhibits no distension. There is no tenderness.  Musculoskeletal: Normal range of motion. He exhibits no edema and no tenderness.  Lymphadenopathy:    He has no cervical adenopathy.  Neurological: He is alert and oriented to person, place, and time. Coordination normal.  Skin: Skin is warm and dry. No rash noted. No erythema.  Psychiatric: He has a normal mood and affect. His behavior is normal. Judgment and thought content normal.           Assessment and Plan

## 2010-09-06 NOTE — Assessment & Plan Note (Signed)
Cholesterol is at goal on the current lipid regimen. No changes to the medications were made.  

## 2010-09-06 NOTE — Assessment & Plan Note (Signed)
Currently with no symptoms of angina. No further workup at this time. Continue current medication regimen. 

## 2010-09-06 NOTE — Patient Instructions (Signed)
You are doing well. No medication changes were made. Please call us if you have new issues that need to be addressed before your next appt.  We will call you for a follow up Appt. In 6 months  

## 2010-09-06 NOTE — Assessment & Plan Note (Signed)
Blood pressure is well controlled on today's visit. No changes made to the medications. 

## 2010-09-07 ENCOUNTER — Ambulatory Visit (INDEPENDENT_AMBULATORY_CARE_PROVIDER_SITE_OTHER): Payer: Medicare Other | Admitting: Internal Medicine

## 2010-09-07 ENCOUNTER — Encounter: Payer: Self-pay | Admitting: Internal Medicine

## 2010-09-07 VITALS — BP 142/70 | HR 63 | Temp 98.5°F | Ht 72.0 in | Wt 216.0 lb

## 2010-09-07 DIAGNOSIS — N400 Enlarged prostate without lower urinary tract symptoms: Secondary | ICD-10-CM

## 2010-09-07 DIAGNOSIS — M159 Polyosteoarthritis, unspecified: Secondary | ICD-10-CM

## 2010-09-07 DIAGNOSIS — E785 Hyperlipidemia, unspecified: Secondary | ICD-10-CM

## 2010-09-07 DIAGNOSIS — E118 Type 2 diabetes mellitus with unspecified complications: Secondary | ICD-10-CM | POA: Insufficient documentation

## 2010-09-07 DIAGNOSIS — I10 Essential (primary) hypertension: Secondary | ICD-10-CM

## 2010-09-07 DIAGNOSIS — I2510 Atherosclerotic heart disease of native coronary artery without angina pectoris: Secondary | ICD-10-CM

## 2010-09-07 DIAGNOSIS — E119 Type 2 diabetes mellitus without complications: Secondary | ICD-10-CM

## 2010-09-07 DIAGNOSIS — K219 Gastro-esophageal reflux disease without esophagitis: Secondary | ICD-10-CM | POA: Insufficient documentation

## 2010-09-07 LAB — CBC WITH DIFFERENTIAL/PLATELET
Basophils Absolute: 0 10*3/uL (ref 0.0–0.1)
Basophils Relative: 0.3 % (ref 0.0–3.0)
Eosinophils Absolute: 0.3 10*3/uL (ref 0.0–0.7)
Eosinophils Relative: 4.5 % (ref 0.0–5.0)
Lymphs Abs: 1.6 10*3/uL (ref 0.7–4.0)
MCHC: 35.2 g/dL (ref 30.0–36.0)
MCV: 94.5 fl (ref 78.0–100.0)
Monocytes Absolute: 0.6 10*3/uL (ref 0.1–1.0)
Monocytes Relative: 7.3 % (ref 3.0–12.0)
Neutro Abs: 5.3 10*3/uL (ref 1.4–7.7)
Neutrophils Relative %: 67.4 % (ref 43.0–77.0)
Platelets: 121 10*3/uL — ABNORMAL LOW (ref 150.0–400.0)
RBC: 4.43 Mil/uL (ref 4.22–5.81)
RDW: 13.1 % (ref 11.5–14.6)
WBC: 7.8 10*3/uL (ref 4.5–10.5)

## 2010-09-07 LAB — BASIC METABOLIC PANEL
BUN: 18 mg/dL (ref 6–23)
CO2: 29 mEq/L (ref 19–32)
Chloride: 106 mEq/L (ref 96–112)
Creatinine, Ser: 1.2 mg/dL (ref 0.4–1.5)
GFR: 65.92 mL/min (ref >60.00)
Glucose, Bld: 197 mg/dL — ABNORMAL HIGH (ref 70–99)
Potassium: 4.9 mEq/L (ref 3.5–5.1)

## 2010-09-07 LAB — HEMOGLOBIN A1C: Hgb A1c MFr Bld: 7.4 % — ABNORMAL HIGH (ref 4.6–6.5)

## 2010-09-07 LAB — TSH: TSH: 0.41 u[IU]/mL (ref 0.35–5.50)

## 2010-09-07 MED ORDER — OMEPRAZOLE 20 MG PO CPDR: 20.0000 mg | DELAYED_RELEASE_CAPSULE | Freq: Every day | ORAL | Status: DC

## 2010-09-07 MED ORDER — GLIMEPIRIDE 1 MG PO TABS: 1.0000 mg | ORAL_TABLET | Freq: Every day | ORAL | Status: DC

## 2010-09-07 MED ORDER — CELECOXIB 200 MG PO CAPS: 200.0000 mg | ORAL_CAPSULE | Freq: Every day | ORAL | Status: DC | PRN

## 2010-09-07 MED ORDER — FINASTERIDE 5 MG PO TABS: 5.0000 mg | ORAL_TABLET | Freq: Every day | ORAL | Status: DC

## 2010-09-07 NOTE — Assessment & Plan Note (Signed)
Will have him try tylenol and only use celebrex prn

## 2010-09-07 NOTE — Assessment & Plan Note (Signed)
Seems to have good control Will check A1c 

## 2010-09-07 NOTE — Assessment & Plan Note (Signed)
BP Readings from Last 3 Encounters:  09/07/10 142/70  09/06/10 120/72  03/22/10 157/74   Reasonable control No changes Check labs on ACEI and beta blocker

## 2010-09-07 NOTE — Assessment & Plan Note (Signed)
Lab Results  Component Value Date   LDLCALC 55 08/31/2010   Trying off med briefly due to pains

## 2010-09-07 NOTE — Patient Instructions (Addendum)
Please get records from last 3 years from DR Sullivan Lone. Please include all immunizations and consultations Please try off the oxybutynin---and stop if you don't notice a difference Please try acetaminophen 650mg  up to three times daily for arthritis---only use the celebrex if absolutely necessary

## 2010-09-07 NOTE — Progress Notes (Signed)
Subjective:    Patient ID: [name] [name], male    DOB: Sep 21, 1933, 77 y.o.   MRN: [id number]  HPI Establishing here Switching from Dr [place] Mam Dr Mariah Milling for his cardiologist  Has history of CAD going back to 12 years ago Had angioplasy and stents Eventually had CABG in 2005  HTN for about 15 years  Satisfied with current regimen  DM diagnosed also 12 years ago Now on oral agent and levemir Checks qAM Good control with A1c---usually ~7% Occ mild low sugar reactions---sluggish Keeps up with eye exams--Dr Fransico Michael  Various joint pains THR in past Recent foot surgery---had been using celebrex but not regular with it now  Voids slowly No sig daytimem problems Up 4 times at night sometimes  Current Outpatient Prescriptions on File Prior to Visit  Medication Sig Dispense Refill  . aspirin 81 MG tablet Take 81 mg by mouth daily.        . carvedilol (COREG) 12.5 MG tablet Take 1 tablet (12.5 mg total) by mouth 2 (two) times daily with a meal.  180 tablet  3  . celecoxib (CELEBREX) 200 MG capsule Take 200 mg by mouth daily as needed.       . Cholecalciferol (VITAMIN D3) 2000 UNITS TABS Take 2,000 Units by mouth 1 dose over 24 hours.        . finasteride (PROSCAR) 5 MG tablet Take 5 mg by mouth daily.        . Flaxseed, Linseed, (FLAXSEED OIL) 1000 MG CAPS Take by mouth.        Marland Kitchen glimepiride (AMARYL) 1 MG tablet Take 1 mg by mouth daily before breakfast.        . insulin detemir (LEVEMIR) 100 UNIT/ML injection Inject 26 Units into the skin at bedtime.        . Multiple Vitamins-Minerals (CENTRUM SILVER PO) Take by mouth.        . Omega-3 Fatty Acids (FISH OIL) 1000 MG CAPS Take 1,000 mg by mouth 1 dose over 24 hours.        Marland Kitchen omeprazole (PRILOSEC) 20 MG capsule Take 20 mg by mouth daily.        Marland Kitchen oxybutynin (DITROPAN) 5 MG tablet Take 5 mg by mouth 3 (three) times daily.        . ramipril (ALTACE) 10 MG capsule Take 1 capsule (10 mg total) by mouth daily.  90 capsule  3  .  vitamin C (ASCORBIC ACID) 500 MG tablet Take 500 mg by mouth daily.        Marland Kitchen DISCONTD: diclofenac (VOLTAREN) 75 MG EC tablet Take 75 mg by mouth 2 (two) times daily.        Marland Kitchen DISCONTD: simvastatin (ZOCOR) 40 MG tablet Take 1 tablet (40 mg total) by mouth at bedtime.  90 tablet  3    No Known Allergies  Past Medical History  Diagnosis Date  . Coronary artery disease   . Hyperlipidemia   . Hypertension   . Type II or unspecified type diabetes mellitus without mention of complication, not stated as uncontrolled 2000  . Hypertrophy of prostate without urinary obstruction and other lower urinary tract symptoms (LUTS)   . GERD (gastroesophageal reflux disease)     Past Surgical History  Procedure Date  . Coronary artery bypass graft 2007    x 4  . Foot surgery 2012    right foot  . Lumbar laminectomy 1989  . Coronary angioplasty with stent placement  before CABG  . Joint replacement 8/10    Right THR--[name]    No family history on file.  History   Social History  . Marital Status: Divorced    Spouse Name: N/A    Number of Children: 4  . Years of Education: N/A   Occupational History  . [employer] planner   Social History Main Topics  . Smoking status: Never Smoker   . Smokeless tobacco: Never Used  . Alcohol Use: Yes     occ cocktail  . Drug Use: No  . Sexually Active: Not on file   Other Topics Concern  . Not on file   Social History Narrative   Goes by [facility] living will Has designated [employer] health care POAWould accept resuscitation attemptsWould not want tube feeds if cognitively unaware       Review of Systems  Constitutional: Negative for fatigue and unexpected weight change.       Has lost 20# in the past few years Wears seat belt  HENT: Positive for dental problem. Negative for hearing loss.        Occ vertigo--none in some time Full upper plate and partial lower  Respiratory:  Negative for chest tightness and shortness of breath.   Cardiovascular: Positive for leg swelling. Negative for chest pain and palpitations.       Slight edema if prolonged sitting Still some swelling in right foot from surgery  Gastrointestinal: Negative for nausea, vomiting, constipation and blood in stool.       Heartburn controlled on meds   Genitourinary: Positive for frequency and difficulty urinating.       Nocturia  Musculoskeletal: Positive for back pain and arthralgias.  Neurological: Negative for syncope, weakness and headaches.  Hematological: Negative for adenopathy. Bruises/bleeds easily.       Bruises easy on aspirin  Psychiatric/Behavioral: Negative for sleep disturbance.       Sleeps okay despite nocturia       Objective:   Physical Exam  Constitutional: He is oriented to person, place, and time. He appears well-developed and well-nourished. No distress.  HENT:  Head: Normocephalic and atraumatic.  Mouth/Throat: Oropharynx is clear and moist. No oropharyngeal exudate.  Eyes: Conjunctivae and EOM are normal. Pupils are equal, round, and reactive to light.  Neck: Normal range of motion. Neck supple. No thyromegaly present.  Cardiovascular: Normal rate, regular rhythm, normal heart sounds and intact distal pulses.  Exam reveals no gallop.   No murmur heard. Pulmonary/Chest: Effort normal. No respiratory distress. He has no wheezes. He has no rales.  Abdominal: Soft. There is no tenderness.  Musculoskeletal: Normal range of motion. He exhibits no edema and no tenderness.  Lymphadenopathy:    He has no cervical adenopathy.  Neurological: He is alert and oriented to person, place, and time.       Sensation normal in feet  Skin: No rash noted.       No foot ulcers  Psychiatric: He has a normal mood and affect. His behavior is normal. Judgment and thought content normal.          Assessment & Plan:

## 2010-09-07 NOTE — Assessment & Plan Note (Signed)
On the finasteride Mostly troubled with nocturia Will have him try off the oxybutynin

## 2010-09-07 NOTE — Assessment & Plan Note (Signed)
Has been quiet Dr Mariah Milling follows

## 2010-09-08 ENCOUNTER — Encounter: Payer: Self-pay | Admitting: Cardiovascular Disease

## 2010-09-10 ENCOUNTER — Encounter: Payer: Self-pay | Admitting: Family Medicine

## 2010-09-10 ENCOUNTER — Encounter: Payer: Self-pay | Admitting: Internal Medicine

## 2010-09-10 ENCOUNTER — Ambulatory Visit (INDEPENDENT_AMBULATORY_CARE_PROVIDER_SITE_OTHER): Payer: Medicare Other | Admitting: Family Medicine

## 2010-09-10 DIAGNOSIS — K644 Residual hemorrhoidal skin tags: Secondary | ICD-10-CM | POA: Insufficient documentation

## 2010-09-10 MED ORDER — HYDROCORTISONE 2.5 % RE CREA: TOPICAL_CREAM | Freq: Two times a day (BID) | RECTAL | Status: AC

## 2010-09-10 NOTE — Progress Notes (Signed)
  Subjective:    Patient ID: [name] [name], male    DOB: 02/03/1934, 77 y.o.   MRN: [id number]  HPI CC: ?boil  On buttock near anus.  Started noticing 2 days ago after bike ride.  Sore bottom, noticing next to anus, tender and sore.  ?boil.  Hasn't had anything like this in past.  Very painful.  No h/ hemorrhoids that he knows of.  No fevers/chills, nausea, abd pain.  No other rash anywhere.  No oral lesions.  Getting ready to go on long road trip next week.  To go with fiancee.  Regular stools, soft, 1-2daily.  States gets good water and fiber intake.  Does bring reading material into bathroom with him.  No bleeding.  On 2 baby aspirin every morning.  Review of Systems Per HPI    Objective:   Physical Exam  Nursing note and vitals reviewed. Constitutional: He appears well-developed and well-nourished. No distress.  Genitourinary: Rectal exam shows external hemorrhoid.          Right external hemorrhoid, inflammed, nonthrombosed.  No bleeding.          Assessment & Plan:

## 2010-09-10 NOTE — Assessment & Plan Note (Signed)
Inflammed, not thrombosed. Discussed treatment and anticipated course of illness. Treat with anusol, no more than 1 wk at a time. increase water, fiber, no reading in bathroom. Advised reasons to return including worsening pain, bleeding, other concerns.

## 2010-09-10 NOTE — Patient Instructions (Signed)
Look like external hemorrhoids. Treat with anusol hc cream daily, warm water soaks 2-3 times daily in tub. Hopeful to help decrease inflammation and resolve hemorrhoids with this. Increase fiber and water, goal is 1 soft stool a day.  Don't read in bathroom. Hemorrhoids Hemorrhoids are dilated (enlarged) veins around the rectum. Sometimes clots will form in the veins. This makes them swollen and painful. These are called thrombosed hemorrhoids. Causes of hemorrhoids include:  Pregnancy: this increases the pressure in the hemorrhoidal veins.   Constipation.   Straining to have a bowel movement.  HOME CARE INSTRUCTIONS  Eat a well balanced diet and drink 6 to 8 glasses of water every day to avoid constipation. You may also use a bulk laxative.   Avoid straining to have bowel movements.   Keep anal area dry and clean.   Only take over-the-counter or prescription medicines for pain, discomfort, or fever as directed by your caregiver.  If thrombosed:  Take hot sitz baths for 20 to 30 minutes, 3 to 4 times per day.   If the hemorrhoids are very tender and swollen, place ice packs on area as tolerated. Using ice packs between sitz baths may be helpful. Fill a plastic bag with ice and use a towel between the bag of ice and your skin.   Special creams and suppositories (Anusol, Nupercainal, Wyanoids) may be used or applied as directed.   Do not use a donut shaped pillow or sit on the toilet for long periods. This increases blood pooling and pain.   Move your bowels when your body has the urge; this will require less straining and will decrease pain and pressure.   Only take over-the-counter or prescription medicines for pain, discomfort, or fever as directed by your caregiver.  SEEK MEDICAL CARE IF:  You have increasing pain and swelling that is not controlled with your prescription.   You have uncontrolled bleeding.   You have an inability or difficulty having a bowel movement.    You have pain or inflammation outside the area of the hemorrhoids.   You have chills and/or an oral temperature above 101 that lasts for 2 days or longer, or as your caregiver suggests.  MAKE SURE YOU:   Understand these instructions.   Will watch your condition.   Will get help right away if you are not doing well or get worse.  Document Released: 02/19/2000 Document Re-Released: 02/04/2008 [facility] Patient Information 2011 [place], [place].

## 2010-09-13 ENCOUNTER — Telehealth: Payer: Self-pay | Admitting: *Deleted

## 2010-09-13 ENCOUNTER — Ambulatory Visit: Payer: Medicare Other | Admitting: Internal Medicine

## 2010-09-13 NOTE — Telephone Encounter (Signed)
Pt has been taking Simvastatin for years, and has recently been having joint pain, so he has held for 1 week and his joints are much better. He would like to try a different cholesterol medication, and he is going out of town on Thursday and would like new Rx called in by Wednesday of this week. [facility] [address] in [place]. He has not ever tried any other chol med and has NKA. Pt's last lipids were drawn 2 weeks ago: TC 109 TG 95 LDL 55 HDL 35. Notified pt would discuss with Dr. Mariah Milling and call him back.

## 2010-09-13 NOTE — Telephone Encounter (Signed)
Would start lipitor Start 20 mg for several weeks, then alternate 40 mg with 20 mg

## 2010-09-14 MED ORDER — ATORVASTATIN CALCIUM 40 MG PO TABS: 40.0000 mg | ORAL_TABLET | ORAL | Status: DC

## 2010-09-14 NOTE — Telephone Encounter (Signed)
Spoke to pt, notified of msg below. He is going out of town to [place] for 3 months, tomorrow. He will try the lipitor and will call with any side effects of this med. Rx sent to pharmacy.

## 2010-12-01 ENCOUNTER — Other Ambulatory Visit: Payer: Self-pay | Admitting: *Deleted

## 2010-12-01 MED ORDER — ZOSTER VACCINE LIVE 19400 UNT/0.65ML ~~LOC~~ SOLR: 0.6500 mL | Freq: Once | SUBCUTANEOUS | Status: DC

## 2010-12-24 ENCOUNTER — Encounter: Payer: Self-pay | Admitting: Internal Medicine

## 2010-12-24 ENCOUNTER — Ambulatory Visit (INDEPENDENT_AMBULATORY_CARE_PROVIDER_SITE_OTHER): Payer: Medicare Other | Admitting: Internal Medicine

## 2010-12-24 VITALS — BP 122/60 | HR 63 | Temp 98.1°F | Ht 72.0 in | Wt 216.1 lb

## 2010-12-24 DIAGNOSIS — M159 Polyosteoarthritis, unspecified: Secondary | ICD-10-CM

## 2010-12-24 DIAGNOSIS — K219 Gastro-esophageal reflux disease without esophagitis: Secondary | ICD-10-CM

## 2010-12-24 DIAGNOSIS — Z23 Encounter for immunization: Secondary | ICD-10-CM

## 2010-12-24 MED ORDER — OMEPRAZOLE 20 MG PO CPDR: 20.0000 mg | DELAYED_RELEASE_CAPSULE | Freq: Two times a day (BID) | ORAL | Status: DC

## 2010-12-24 NOTE — Patient Instructions (Signed)
Please call if your stomach/hernia is not better in the next few weeks. We will set up evaluation by a GI specialist

## 2010-12-24 NOTE — Progress Notes (Signed)
Subjective:    Patient ID: [name] [name], male    DOB: 02-01-1934, 77 y.o.   MRN: [id number]  HPI Here with wife  On omeprazole for reflux chronically Has been having increasing substernal discomfort for a few months Some better with tums or mylanta Had spell of clammy feeling recently---at [facility] Someone called EMS  Appetite is reasonable Trying to lose some weight No nausea or vomiting No swallowing  Current Outpatient Prescriptions on File Prior to Visit  Medication Sig Dispense Refill  . aspirin 81 MG tablet Take 81 mg by mouth daily.        Marland Kitchen atorvastatin (LIPITOR) 40 MG tablet Take 1 tablet (40 mg total) by mouth as directed. Take 20mg  for 3-4 weeks, then increase dosage to 20mg  alternating with 40mg  daily.  90 tablet  4  . carvedilol (COREG) 12.5 MG tablet Take 1 tablet (12.5 mg total) by mouth 2 (two) times daily with a meal.  180 tablet  3  . celecoxib (CELEBREX) 200 MG capsule Take 1 capsule (200 mg total) by mouth daily as needed.  90 capsule  0  . Cholecalciferol (VITAMIN D3) 2000 UNITS TABS Take 2,000 Units by mouth 1 dose over 24 hours.        . finasteride (PROSCAR) 5 MG tablet Take 1 tablet (5 mg total) by mouth daily.  90 tablet  3  . Flaxseed, Linseed, (FLAXSEED OIL) 1000 MG CAPS Take by mouth.        Marland Kitchen glimepiride (AMARYL) 1 MG tablet Take 1 tablet (1 mg total) by mouth daily before breakfast.  90 tablet  3  . insulin detemir (LEVEMIR) 100 UNIT/ML injection Inject 26 Units into the skin at bedtime.        . Multiple Vitamins-Minerals (CENTRUM SILVER PO) Take by mouth.        . Omega-3 Fatty Acids (FISH OIL) 1000 MG CAPS Take 1,000 mg by mouth 1 dose over 24 hours.        Marland Kitchen omeprazole (PRILOSEC) 20 MG capsule Take 1 capsule (20 mg total) by mouth daily.  90 capsule  3  . oxybutynin (DITROPAN) 5 MG tablet Take 5 mg by mouth 3 (three) times daily.        . ramipril (ALTACE) 10 MG capsule Take 1 capsule (10 mg total) by mouth daily.  90 capsule  3    . vitamin C (ASCORBIC ACID) 500 MG tablet Take 500 mg by mouth daily.        Marland Kitchen zoster vaccine live, PF, (ZOSTAVAX) 78295 UNT/0.65ML injection Inject 19,400 Units into the skin once.  1 vial  0    No Known Allergies  Past Medical History  Diagnosis Date  . Coronary artery disease   . Hyperlipidemia   . Hypertension   . Type II or unspecified type diabetes mellitus without mention of complication, not stated as uncontrolled 2000  . Hypertrophy of prostate without urinary obstruction and other lower urinary tract symptoms (LUTS)   . GERD (gastroesophageal reflux disease)     Past Surgical History  Procedure Date  . Coronary artery bypass graft 2007    x 4  . Foot surgery 2012    right foot  . Lumbar laminectomy 1989  . Coronary angioplasty with stent placement     before CABG  . Joint replacement 8/10    Right THR--[name]    Family History  Problem Relation Age of Onset  . Cancer Mother   . Heart disease Father  History   Social History  . Marital Status: Divorced    Spouse Name: N/A    Number of Children: 4  . Years of Education: N/A   Occupational History  . [employer]   Social History Main Topics  . Smoking status: Never Smoker   . Smokeless tobacco: Never Used  . Alcohol Use: Yes     occ cocktail  . Drug Use: No  . Sexually Active: Not on file   Other Topics Concern  . Not on file   Social History Narrative   Goes by [employer] living will Has designated [employer] health care POAWould accept resuscitation attemptsWould not want tube feeds if cognitively unaware   Review of Systems Bowels are fine Rarely takes his celebrex--no OTC NSAIDs    Objective:   Physical Exam  Constitutional: He appears well-developed and well-nourished. No distress.  Neck: Normal range of motion. Neck supple.  Cardiovascular: Normal rate, regular rhythm and normal heart sounds.  Exam reveals no gallop.    No murmur heard. Pulmonary/Chest: Effort normal and breath sounds normal. No respiratory distress. He has no wheezes. He has no rales.  Abdominal: Soft.       Very slight epigastric tenderness  Lymphadenopathy:    He has no cervical adenopathy.          Assessment & Plan:

## 2010-12-24 NOTE — Assessment & Plan Note (Signed)
Okay to use the celebrex more prn when stomach quiets down Not clear if it helps

## 2010-12-24 NOTE — Assessment & Plan Note (Signed)
Has worsened Has not been taking the omeprazole fasting---discussed No dysphagia  Will increase to bid GI eval if persists

## 2011-03-17 ENCOUNTER — Ambulatory Visit: Payer: Medicare Other | Admitting: Internal Medicine

## 2011-03-17 DIAGNOSIS — Z0289 Encounter for other administrative examinations: Secondary | ICD-10-CM

## 2011-03-29 ENCOUNTER — Ambulatory Visit: Payer: Medicare Other | Admitting: Cardiovascular Disease

## 2011-04-26 ENCOUNTER — Encounter: Payer: Self-pay | Admitting: Family Medicine

## 2011-04-26 ENCOUNTER — Encounter (INDEPENDENT_AMBULATORY_CARE_PROVIDER_SITE_OTHER): Payer: Self-pay | Admitting: General Surgery

## 2011-04-26 ENCOUNTER — Ambulatory Visit (INDEPENDENT_AMBULATORY_CARE_PROVIDER_SITE_OTHER): Payer: Medicare Other | Admitting: General Surgery

## 2011-04-26 ENCOUNTER — Ambulatory Visit (INDEPENDENT_AMBULATORY_CARE_PROVIDER_SITE_OTHER): Payer: Medicare Other | Admitting: Family Medicine

## 2011-04-26 ENCOUNTER — Telehealth: Payer: Self-pay | Admitting: Internal Medicine

## 2011-04-26 VITALS — BP 128/72 | HR 72 | Temp 98.6°F | Wt 227.5 lb

## 2011-04-26 VITALS — BP 142/68 | HR 66 | Temp 98.6°F | Ht 74.0 in | Wt 225.2 lb

## 2011-04-26 DIAGNOSIS — L988 Other specified disorders of the skin and subcutaneous tissue: Secondary | ICD-10-CM | POA: Insufficient documentation

## 2011-04-26 DIAGNOSIS — K612 Anorectal abscess: Secondary | ICD-10-CM

## 2011-04-26 DIAGNOSIS — K610 Anal abscess: Secondary | ICD-10-CM

## 2011-04-26 DIAGNOSIS — L089 Local infection of the skin and subcutaneous tissue, unspecified: Secondary | ICD-10-CM

## 2011-04-26 MED ORDER — HYDROCODONE-ACETAMINOPHEN 5-325 MG PO TABS: 1.0000 | ORAL_TABLET | ORAL | Status: AC | PRN

## 2011-04-26 MED ORDER — CEPHALEXIN 500 MG PO CAPS: 500.0000 mg | ORAL_CAPSULE | Freq: Three times a day (TID) | ORAL | Status: AC

## 2011-04-26 NOTE — Telephone Encounter (Signed)
Triage Record Num: [id number] Operator: Tomasita Crumble Patient Name: [name] [name] Call Date & Time: 04/26/2011 9:33:18AM Patient Phone: [phone] PCP: Tillman Abide Patient Gender: Male PCP Fax : [phone] Patient DOB: 1933/07/12 Practice Name: [facility] Reason for Call: Caller: [name]; PCP: Tillman Abide I.; CB#: [phone]; ; ; Call regarding Hemorrhoids; Relates he had hemorroids previously. Flare onset 04/21/11. Used Preparation H for same. Woke 2/19 w/ bloody underwear and linens- larger than dinner plate and he is still oozing.. Rectal pain rated at 5-6 of 10. Spoke w/ Dee at office regarding need for appt and none open at this time. She advised she will contact patient when appt. is found for him. Caller informed. Parameters for callback given. Protocol(s) Used: Gastrointestinal Bleeding Recommended Outcome per Protocol: See Provider within 4 hours Reason for Outcome: One or more episodes of rectal bleeding (more than scant) and no symptoms of hypovolemia Care Advice: ~ DO NOT drive until consulting with provider. Call provider immediately if symptoms of hypovolemia develop including: pulse more than 100/minute, lightheaded or dizzy when rising from sitting/reclining position, shortness of breath, weakness with exertion, angina, or paleness. ~ 04/26/2011 9:59:01AM Page 1 of 1 [id number]

## 2011-04-26 NOTE — Progress Notes (Signed)
Chief complaint: Pain swelling and drainage and rectum  History: Patient is a 78 year old male patient of Dr. Alphonsus Sias referred for an apparent abscess. The patient states he began to have an area of pain and swelling just in front of his anus about 4-5 days ago. Last night this became much more severe and then he experienced a large amount of bloody drainage overnight with some relief of his discomfort today. He was evaluated his primary office today and referred. He has no history of any previous definite abscess although was treated for a "hemorrhoids" a year or 2 ago but did not have apparent drainage or infection. No fever or chills  Past Medical History  Diagnosis Date  . Coronary artery disease   . Hyperlipidemia   . Hypertension   . Type II or unspecified type diabetes mellitus without mention of complication, not stated as uncontrolled 2000  . Hypertrophy of prostate without urinary obstruction and other lower urinary tract symptoms (LUTS)   . GERD (gastroesophageal reflux disease)   . Perirectal fistula    Past Surgical History  Procedure Date  . Coronary artery bypass graft 2007    x 4  . Foot surgery 2012    right foot  . Lumbar laminectomy 1989  . Coronary angioplasty with stent placement     before CABG  . Joint replacement 8/10    Right THR--[name]   Current Outpatient Prescriptions  Medication Sig Dispense Refill  . aspirin 81 MG tablet Take 162 mg by mouth daily.       Marland Kitchen atorvastatin (LIPITOR) 40 MG tablet Take 1 tablet (40 mg total) by mouth as directed. Take 20mg  for 3-4 weeks, then increase dosage to 20mg  alternating with 40mg  daily.  90 tablet  4  . carvedilol (COREG) 12.5 MG tablet Take 1 tablet (12.5 mg total) by mouth 2 (two) times daily with a meal.  180 tablet  3  . celecoxib (CELEBREX) 200 MG capsule Take 1 capsule (200 mg total) by mouth daily as needed.  90 capsule  0  . Cholecalciferol (VITAMIN D3) 2000 UNITS TABS Take 2,000 Units by mouth 1 dose over 24  hours.        . finasteride (PROSCAR) 5 MG tablet Take 1 tablet (5 mg total) by mouth daily.  90 tablet  3  . Flaxseed, Linseed, (FLAXSEED OIL) 1000 MG CAPS Take by mouth.        Marland Kitchen glimepiride (AMARYL) 1 MG tablet Take 1 tablet (1 mg total) by mouth daily before breakfast.  90 tablet  3  . insulin detemir (LEVEMIR) 100 UNIT/ML injection Inject 38 Units into the skin at bedtime.       . Multiple Vitamins-Minerals (CENTRUM SILVER PO) Take by mouth.        . Omega-3 Fatty Acids (FISH OIL) 1000 MG CAPS Take 1,000 mg by mouth 1 dose over 24 hours.        Marland Kitchen omeprazole (PRILOSEC) 20 MG capsule Take 1 capsule (20 mg total) by mouth 2 (two) times daily.  60 capsule  11  . oxybutynin (DITROPAN) 5 MG tablet Take 5 mg by mouth daily.       . ramipril (ALTACE) 10 MG capsule Take 1 capsule (10 mg total) by mouth daily.  90 capsule  3  . vitamin C (ASCORBIC ACID) 500 MG tablet Take 500 mg by mouth daily.         No Known Allergies  Exam: BP 142/68  Pulse 66  Temp(Src) 98.6  F (37 C) (Temporal)  Ht 6\' 2"  (1.88 m)  Wt 225 lb 3.2 oz (102.15 kg)  BMI 28.91 kg/m2  SpO2 95%  Gen.: Alert male in no distress Rectal: To 3 cm anterior to the anal verge is a small pinpoint opening with bloody purulent drainage and underlying fluctuance and tenderness with minimal erythema.  Assessment and plan: Partially spontaneously draining perianal abscess. I recommended further unroofing the area and after explaining the procedure under local anesthesia and the overlying skin was excised and the area drained and packed with gauze. He will remove this tomorrow and begin daily soaks. He is given prescriptions for Vicodin and Keflex. He will return in 3-4 weeks to assess for healing versus fistula formation.

## 2011-04-26 NOTE — Patient Instructions (Addendum)
Pass by [name] [name]'s office to see if we can get you in this afternoon with surgery. This could be boil or possibly sinus tract. Good to see you today, call us with questions.

## 2011-04-26 NOTE — Telephone Encounter (Signed)
Seeing Dr Reece Agar

## 2011-04-26 NOTE — Progress Notes (Signed)
  Subjective:    Patient ID: [name] [name], male    DOB: May 15, 1933, 78 y.o.   MRN: [id number]  HPI CC: rectal bleeding  H/o external hemorrhoid in past.  Started with throbbing rectal pain 4 d ago.  Prior no bleeding.  Worse pain last night.  This morning when awoke blood soaked through underwear to bed, dribbling.  Has been using proctosol-HC and preparation H started yesterday afternoon.  Also used warm sitz baths.  Also using donut pillow.  BM - regular. Denies constipation.  Soft stools normally.  Plenty of fluid.  Last summer 2012 had first hemorrhoid issues.    Was supposed to go on vacation today.  H/o boil on back in past.  Review of Systems Per HPI    Objective:   Physical Exam  Nursing note and vitals reviewed. Constitutional: He appears well-developed and well-nourished. No distress.  Genitourinary:          Perineal region left about 3-4cm anterior from anus with oozing opening.  No fluctuance appreciated.  Some surrounding skin thickening that has appearance of hydradenitis but very localized. No pus draining.  Skin: Skin is warm and dry.  Psychiatric: He has a normal mood and affect.       Assessment & Plan:

## 2011-04-26 NOTE — Assessment & Plan Note (Addendum)
External hemorrhoids present but no inflammation, active bleeding. What appears like oozing sinus tract present.  ?hydradenitis changes but very localized. Given location will refer to gen surg for evaluation and treatment. No obvious evidence of infection today. Will see if can get in to urgent surgery clinic this afternoon.

## 2011-04-26 NOTE — Patient Instructions (Signed)
Peri-Rectal Abscess Your caregiver has diagnosed you as having a peri-rectal abscess. This is an infected area near the rectum that is filled with pus. If the abscess is near the surface of the skin, your caregiver may open (incise) the area and drain the pus. HOME CARE INSTRUCTIONS   If your abscess was opened up and drained. A small piece of gauze may be placed in the opening so that it can drain. Do not remove the gauze unless directed by your caregiver.   A loose dressing may be placed over the abscess site. Change the dressing as often as necessary to keep it clean and dry.   After the drain is removed, the area may be washed with a gentle antiseptic (soap) four times per day.   A warm sitz bath, warm packs or heating pad may be used for pain relief, taking care not to burn yourself.   Return for a wound check in 1 day or as directed.   An "inflatable doughnut" may be used for sitting with added comfort. These can be purchased at a drugstore or medical supply house.   To reduce pain and straining with bowel movements, eat a high fiber diet with plenty of fruits and vegetables. Use stool softeners as recommended by your caregiver. This is especially important if narcotic type pain medications were prescribed as these may cause marked constipation.   Only take over-the-counter or prescription medicines for pain, discomfort, or fever as directed by your caregiver.  SEEK IMMEDIATE MEDICAL CARE IF:   You have increasing pain that is not controlled by medication.   There is increased inflammation (redness), swelling, bleeding, or drainage from the area.   An oral temperature above 102 F (38.9 C) develops.   You develop chills or generalized malaise (feel lethargic or feel "washed out").     You develop any new symptoms (problems) you feel may be related to your present problem.  Document Released: 02/19/2000 Document Revised: 11/03/2010 Document Reviewed: 02/19/2008 Exit[employer]  Patient Information 2012 [employer]. 

## 2011-05-05 ENCOUNTER — Ambulatory Visit: Payer: Medicare Other | Admitting: Cardiovascular Disease

## 2011-05-11 ENCOUNTER — Encounter: Payer: Self-pay | Admitting: Cardiovascular Disease

## 2011-05-11 ENCOUNTER — Ambulatory Visit (INDEPENDENT_AMBULATORY_CARE_PROVIDER_SITE_OTHER): Payer: Medicare Other | Admitting: Cardiovascular Disease

## 2011-05-11 VITALS — BP 122/72 | HR 61 | Ht 74.0 in | Wt 219.8 lb

## 2011-05-11 DIAGNOSIS — Z951 Presence of aortocoronary bypass graft: Secondary | ICD-10-CM

## 2011-05-11 DIAGNOSIS — E119 Type 2 diabetes mellitus without complications: Secondary | ICD-10-CM

## 2011-05-11 DIAGNOSIS — E785 Hyperlipidemia, unspecified: Secondary | ICD-10-CM

## 2011-05-11 DIAGNOSIS — I2510 Atherosclerotic heart disease of native coronary artery without angina pectoris: Secondary | ICD-10-CM

## 2011-05-11 DIAGNOSIS — I10 Essential (primary) hypertension: Secondary | ICD-10-CM

## 2011-05-11 NOTE — Progress Notes (Signed)
Patient ID: [name] [name], male    DOB: 1933/10/12, 78 y.o.   MRN: [id number]  HPI Comments: 78 year old gentleman with a history of coronary artery disease, bypass surgery in November 2006, history of diabetes, hyperlipidemia, hypertension, erectile dysfunction who presents for routine followup.     He reports that he continues to be happy, has no complaints. He denies any chest pain. He rides his bike on a regular basis. He did have foot surgery which has taken a long time to heal. Hiatal hernia pain comes on at rest, not with exertion No edema, no significant shortness of breath, no lightheadedness or dizziness. He has been traveling around the country.    Echocardiogram April 2009 shows normal systolic function with mild LVH, diastolic relaxation abnormality, mildly enlarged left atrium, mild aortic insufficiency.    Cardiac CT scan in March 2009 showed severe coronary artery disease with patent grafts x4, saphenous vein graft to the PDA, OM, diagonal and a LIMA graft to the LAD.    Labs from May 2011 shows total cholesterol 138, LDL 69, HDL 32, hemoglobin A1c 6.6    EKG shows normal sinus rhythm with rate 61 beats per minute, no significant ST or T wave changes.    Outpatient Encounter Prescriptions as of 05/11/2011  Medication Sig Dispense Refill  . aspirin 81 MG tablet Take 162 mg by mouth daily.       Marland Kitchen atorvastatin (LIPITOR) 40 MG tablet Take 20 mg by mouth daily.      . carvedilol (COREG) 12.5 MG tablet Take 1 tablet (12.5 mg total) by mouth 2 (two) times daily with a meal.  180 tablet  3  . Cholecalciferol (VITAMIN D3) 2000 UNITS TABS Take 2,000 Units by mouth 1 day or 1 dose.        . finasteride (PROSCAR) 5 MG tablet Take 1 tablet (5 mg total) by mouth daily.  90 tablet  3  . Flaxseed, Linseed, (FLAXSEED OIL) 1000 MG CAPS Take by mouth.        Marland Kitchen glimepiride (AMARYL) 1 MG tablet Take 1 tablet (1 mg total) by mouth daily before breakfast.  90 tablet  3  . insulin detemir  (LEVEMIR) 100 UNIT/ML injection Inject 38 Units into the skin at bedtime.       . Multiple Vitamins-Minerals (CENTRUM SILVER PO) Take by mouth.        Marland Kitchen omeprazole (PRILOSEC) 20 MG capsule Take 1 capsule (20 mg total) by mouth 2 (two) times daily.  60 capsule  11  . oxybutynin (DITROPAN) 5 MG tablet Take 5 mg by mouth daily.       . ramipril (ALTACE) 10 MG capsule Take 1 capsule (10 mg total) by mouth daily.  90 capsule  3  . tobramycin-dexamethasone (TOBRADEX) ophthalmic solution Place 1 drop into the left eye 2 (two) times daily.        Review of Systems  Constitutional: Negative.   HENT: Negative.   Eyes: Negative.   Respiratory: Positive for shortness of breath.   Cardiovascular: Negative.   Gastrointestinal: Negative.   Musculoskeletal: Negative.   Skin: Negative.   Neurological: Negative.   Hematological: Negative.   Psychiatric/Behavioral: Negative.   All other systems reviewed and are negative.    BP 122/72  Pulse 61  Ht 6\' 2"  (1.88 m)  Wt 219 lb 12.8 oz (99.701 kg)  BMI 28.22 kg/m2  Physical Exam  Nursing note and vitals reviewed. Constitutional: He is oriented to person, place, and time.  He appears well-developed and well-nourished.       Obese  HENT:  Head: Normocephalic.  Nose: Nose normal.  Mouth/Throat: Oropharynx is clear and moist.  Eyes: Conjunctivae are normal. Pupils are equal, round, and reactive to light.  Neck: Normal range of motion. Neck supple. No JVD present.  Cardiovascular: Normal rate, regular rhythm, S1 normal, S2 normal, normal heart sounds and intact distal pulses.  Exam reveals no gallop and no friction rub.   No murmur heard. Pulmonary/Chest: Effort normal and breath sounds normal. No respiratory distress. He has no wheezes. He has no rales. He exhibits no tenderness.  Abdominal: Soft. Bowel sounds are normal. He exhibits no distension. There is no tenderness.  Musculoskeletal: Normal range of motion. He exhibits no edema and no tenderness.   Lymphadenopathy:    He has no cervical adenopathy.  Neurological: He is alert and oriented to person, place, and time. Coordination normal.  Skin: Skin is warm and dry. No rash noted. No erythema.  Psychiatric: He has a normal mood and affect. His behavior is normal. Judgment and thought content normal.           Assessment and Plan

## 2011-05-11 NOTE — Assessment & Plan Note (Signed)
Currently with no symptoms of angina. No further workup at this time. Continue current medication regimen. Some shortness of breath which is likely secondary to deconditioning and obesity.

## 2011-05-11 NOTE — Patient Instructions (Signed)
You are doing well. No medication changes were made. Please come in for a lab at your convenience Watch the diabetes!  Please call us if you have new issues that need to be addressed before your next appt.  Your physician wants you to follow-up in: 6 months.  You will receive a reminder letter in the mail two months in advance. If you don't receive a letter, please call our office to schedule the follow-up appointment.

## 2011-05-11 NOTE — Assessment & Plan Note (Signed)
Blood pressure is well controlled on today's visit. No changes made to the medications. 

## 2011-05-11 NOTE — Assessment & Plan Note (Signed)
We have suggested he have his lab work recheck as he is taking less statin. He can do this at his convenience.

## 2011-05-11 NOTE — Assessment & Plan Note (Signed)
Hemoglobin A1c 7.3. We have asked him to watch his diet closely. Increase his exercise.

## 2011-05-12 ENCOUNTER — Ambulatory Visit (INDEPENDENT_AMBULATORY_CARE_PROVIDER_SITE_OTHER): Payer: Medicare Other

## 2011-05-12 DIAGNOSIS — E785 Hyperlipidemia, unspecified: Secondary | ICD-10-CM

## 2011-05-13 LAB — LIPID PANEL
Chol/HDL Ratio: 3.3 ratio units (ref 0.0–5.0)
HDL: 34 mg/dL — ABNORMAL LOW (ref >39)
VLDL Cholesterol Cal: 20 mg/dL (ref 5–40)

## 2011-05-13 LAB — HEPATIC FUNCTION PANEL
Albumin: 4.2 g/dL (ref 3.5–4.8)
Alkaline Phosphatase: 140 IU/L (ref 25–160)
Total Protein: 6.3 g/dL (ref 6.0–8.5)

## 2011-05-15 ENCOUNTER — Encounter (HOSPITAL_COMMUNITY): Payer: Self-pay | Admitting: Emergency Medicine

## 2011-05-15 ENCOUNTER — Other Ambulatory Visit: Payer: Self-pay

## 2011-05-15 ENCOUNTER — Observation Stay (HOSPITAL_COMMUNITY)
Admission: EM | Admit: 2011-05-15 | Discharge: 2011-05-16 | Disposition: A | Discharge status: Home / Self Care | Payer: Medicare Other | Attending: Surgery | Admitting: Surgery

## 2011-05-15 ENCOUNTER — Emergency Department (HOSPITAL_COMMUNITY): Payer: Medicare Other | Admitting: Anesthesiology

## 2011-05-15 ENCOUNTER — Encounter (HOSPITAL_COMMUNITY)
Admission: EM | Disposition: A | Discharge status: Home / Self Care | Payer: Self-pay | Source: Home / Self Care | Attending: Emergency Medicine

## 2011-05-15 ENCOUNTER — Encounter (HOSPITAL_COMMUNITY): Payer: Self-pay | Admitting: Anesthesiology

## 2011-05-15 ENCOUNTER — Emergency Department (HOSPITAL_COMMUNITY): Payer: Medicare Other

## 2011-05-15 DIAGNOSIS — N400 Enlarged prostate without lower urinary tract symptoms: Secondary | ICD-10-CM | POA: Insufficient documentation

## 2011-05-15 DIAGNOSIS — K8010 Calculus of gallbladder with chronic cholecystitis without obstruction: Principal | ICD-10-CM | POA: Insufficient documentation

## 2011-05-15 DIAGNOSIS — E119 Type 2 diabetes mellitus without complications: Secondary | ICD-10-CM | POA: Insufficient documentation

## 2011-05-15 DIAGNOSIS — K219 Gastro-esophageal reflux disease without esophagitis: Secondary | ICD-10-CM | POA: Insufficient documentation

## 2011-05-15 DIAGNOSIS — I2510 Atherosclerotic heart disease of native coronary artery without angina pectoris: Secondary | ICD-10-CM | POA: Insufficient documentation

## 2011-05-15 DIAGNOSIS — K819 Cholecystitis, unspecified: Secondary | ICD-10-CM

## 2011-05-15 DIAGNOSIS — R1011 Right upper quadrant pain: Secondary | ICD-10-CM

## 2011-05-15 DIAGNOSIS — E785 Hyperlipidemia, unspecified: Secondary | ICD-10-CM | POA: Insufficient documentation

## 2011-05-15 DIAGNOSIS — I10 Essential (primary) hypertension: Secondary | ICD-10-CM | POA: Insufficient documentation

## 2011-05-15 HISTORY — PX: CHOLECYSTECTOMY: SHX55

## 2011-05-15 LAB — COMPREHENSIVE METABOLIC PANEL
ALT: 18 U/L (ref 0–53)
AST: 22 U/L (ref 0–37)
Albumin: 3.6 g/dL (ref 3.5–5.2)
Alkaline Phosphatase: 154 U/L — ABNORMAL HIGH (ref 39–117)
BUN: 16 mg/dL (ref 6–23)
CO2: 23 mEq/L (ref 19–32)
Calcium: 9.4 mg/dL (ref 8.4–10.5)
Chloride: 104 mEq/L (ref 96–112)
Creatinine, Ser: 1.05 mg/dL (ref 0.50–1.35)
GFR calc Af Amer: 78 mL/min — ABNORMAL LOW (ref >90)
GFR calc non Af Amer: 67 mL/min — ABNORMAL LOW (ref >90)
Glucose, Bld: 266 mg/dL — ABNORMAL HIGH (ref 70–99)
Potassium: 4.2 mEq/L (ref 3.5–5.1)
Sodium: 137 mEq/L (ref 135–145)
Total Bilirubin: 0.7 mg/dL (ref 0.3–1.2)
Total Protein: 6.3 g/dL (ref 6.0–8.3)

## 2011-05-15 LAB — CBC
HCT: 41.6 % (ref 39.0–52.0)
MCV: 86.7 fL (ref 78.0–100.0)
RDW: 12.6 % (ref 11.5–15.5)
WBC: 8.8 10*3/uL (ref 4.0–10.5)

## 2011-05-15 LAB — DIFFERENTIAL
Basophils Absolute: 0.1 10*3/uL (ref 0.0–0.1)
Basophils Relative: 1 % (ref 0–1)
Eosinophils Absolute: 0.4 10*3/uL (ref 0.0–0.7)
Eosinophils Relative: 4 % (ref 0–5)
Lymphocytes Relative: 24 % (ref 12–46)
Lymphs Abs: 2.2 10*3/uL (ref 0.7–4.0)
Monocytes Absolute: 0.9 10*3/uL (ref 0.1–1.0)
Monocytes Relative: 11 % (ref 3–12)
Neutro Abs: 5.3 10*3/uL (ref 1.7–7.7)
Neutrophils Relative %: 60 % (ref 43–77)

## 2011-05-15 LAB — URINALYSIS, ROUTINE W REFLEX MICROSCOPIC
Leukocytes, UA: NEGATIVE
Protein, ur: NEGATIVE mg/dL
Urobilinogen, UA: 0.2 mg/dL (ref 0.0–1.0)

## 2011-05-15 LAB — POCT I-STAT TROPONIN I: Troponin i, poc: 0 ng/mL (ref 0.00–0.08)

## 2011-05-15 LAB — LIPASE, BLOOD: Lipase: 30 U/L (ref 11–59)

## 2011-05-15 LAB — GLUCOSE, CAPILLARY
Glucose-Capillary: 243 mg/dL — ABNORMAL HIGH (ref 70–99)
Glucose-Capillary: 228 mg/dL — ABNORMAL HIGH (ref 70–99)

## 2011-05-15 SURGERY — LAPAROSCOPIC CHOLECYSTECTOMY
Anesthesia: General | Site: Abdomen | Wound class: Clean Contaminated

## 2011-05-15 MED ORDER — SUCCINYLCHOLINE CHLORIDE 20 MG/ML IJ SOLN
INTRAMUSCULAR | Status: DC | PRN
  Administered 2011-05-15: 120 mg via INTRAVENOUS

## 2011-05-15 MED ORDER — GLIMEPIRIDE 1 MG PO TABS
1.0000 mg | ORAL_TABLET | Freq: Every day | ORAL | Status: DC
  Administered 2011-05-16: 1 mg via ORAL
  Filled 2011-05-15 (×2): qty 1

## 2011-05-15 MED ORDER — SODIUM CHLORIDE 0.9 % IV SOLN
INTRAVENOUS | Status: DC | PRN
  Administered 2011-05-15: 14:00:00 via INTRAVENOUS

## 2011-05-15 MED ORDER — BUPIVACAINE-EPINEPHRINE 0.25% -1:200000 IJ SOLN
INTRAMUSCULAR | Status: DC | PRN
  Administered 2011-05-15: 10 mL

## 2011-05-15 MED ORDER — ACETAMINOPHEN 325 MG PO TABS
650.0000 mg | ORAL_TABLET | Freq: Four times a day (QID) | ORAL | Status: DC | PRN
  Administered 2011-05-15 – 2011-05-16 (×2): 650 mg via ORAL
  Filled 2011-05-15 (×2): qty 2

## 2011-05-15 MED ORDER — MORPHINE SULFATE 4 MG/ML IJ SOLN
4.0000 mg | Freq: Once | INTRAMUSCULAR | Status: AC
  Administered 2011-05-15: 4 mg via INTRAVENOUS
  Filled 2011-05-15: qty 1

## 2011-05-15 MED ORDER — MIDAZOLAM HCL 5 MG/5ML IJ SOLN
INTRAMUSCULAR | Status: DC | PRN
  Administered 2011-05-15: 1 mg via INTRAVENOUS

## 2011-05-15 MED ORDER — MORPHINE SULFATE 2 MG/ML IJ SOLN
2.0000 mg | INTRAMUSCULAR | Status: DC | PRN
  Administered 2011-05-15: 2 mg via INTRAVENOUS
  Filled 2011-05-15: qty 1

## 2011-05-15 MED ORDER — HYDROMORPHONE HCL PF 1 MG/ML IJ SOLN
0.5000 mg | Freq: Once | INTRAMUSCULAR | Status: AC
  Administered 2011-05-15: 0.5 mg via INTRAVENOUS

## 2011-05-15 MED ORDER — GLYCOPYRROLATE 0.2 MG/ML IJ SOLN
INTRAMUSCULAR | Status: DC | PRN
Start: 2011-05-15 — End: 2011-05-15
  Administered 2011-05-15: 0.4 mg via INTRAVENOUS
  Administered 2011-05-15: .6 mg via INTRAVENOUS

## 2011-05-15 MED ORDER — PANTOPRAZOLE SODIUM 40 MG PO TBEC
40.0000 mg | DELAYED_RELEASE_TABLET | Freq: Every day | ORAL | Status: DC
  Administered 2011-05-15 – 2011-05-16 (×2): 40 mg via ORAL
  Filled 2011-05-15 (×2): qty 1

## 2011-05-15 MED ORDER — NEOSTIGMINE METHYLSULFATE 1 MG/ML IJ SOLN
INTRAMUSCULAR | Status: DC | PRN
  Administered 2011-05-15: 4 mg via INTRAVENOUS

## 2011-05-15 MED ORDER — RAMIPRIL 10 MG PO CAPS
10.0000 mg | ORAL_CAPSULE | Freq: Every day | ORAL | Status: DC
  Administered 2011-05-15 – 2011-05-16 (×2): 10 mg via ORAL
  Filled 2011-05-15 (×2): qty 1

## 2011-05-15 MED ORDER — INSULIN ASPART 100 UNIT/ML ~~LOC~~ SOLN
SUBCUTANEOUS | Status: DC | PRN
  Administered 2011-05-15: 10 [IU] via SUBCUTANEOUS

## 2011-05-15 MED ORDER — LACTATED RINGERS IV SOLN
INTRAVENOUS | Status: DC | PRN
  Administered 2011-05-15: 13:00:00 via INTRAVENOUS

## 2011-05-15 MED ORDER — INSULIN ASPART 100 UNIT/ML ~~LOC~~ SOLN
SUBCUTANEOUS | Status: AC
  Administered 2011-05-15: 10 [IU] via SUBCUTANEOUS
  Filled 2011-05-15: qty 1

## 2011-05-15 MED ORDER — FENTANYL CITRATE 0.05 MG/ML IJ SOLN
INTRAMUSCULAR | Status: DC | PRN
  Administered 2011-05-15 (×3): 50 ug via INTRAVENOUS

## 2011-05-15 MED ORDER — TOBRAMYCIN-DEXAMETHASONE 0.3-0.1 % OP SUSP
1.0000 [drp] | Freq: Two times a day (BID) | OPHTHALMIC | Status: DC
  Administered 2011-05-15 – 2011-05-16 (×2): 1 [drp] via OPHTHALMIC
  Filled 2011-05-15: qty 2.5

## 2011-05-15 MED ORDER — INSULIN ASPART 100 UNIT/ML ~~LOC~~ SOLN
0.0000 [IU] | Freq: Three times a day (TID) | SUBCUTANEOUS | Status: DC
  Administered 2011-05-15: 3 [IU] via SUBCUTANEOUS
  Administered 2011-05-16: 5 [IU] via SUBCUTANEOUS
  Administered 2011-05-16: 3 [IU] via SUBCUTANEOUS
  Filled 2011-05-15: qty 3

## 2011-05-15 MED ORDER — FINASTERIDE 5 MG PO TABS
5.0000 mg | ORAL_TABLET | Freq: Every day | ORAL | Status: DC
  Administered 2011-05-15 – 2011-05-16 (×2): 5 mg via ORAL
  Filled 2011-05-15 (×2): qty 1

## 2011-05-15 MED ORDER — IOHEXOL 300 MG/ML  SOLN
20.0000 mL | INTRAMUSCULAR | Status: AC
  Administered 2011-05-15 (×2): 20 mL via ORAL

## 2011-05-15 MED ORDER — LABETALOL HCL 5 MG/ML IV SOLN
INTRAVENOUS | Status: DC | PRN
  Administered 2011-05-15: 5 mg via INTRAVENOUS

## 2011-05-15 MED ORDER — ONDANSETRON HCL 4 MG/2ML IJ SOLN
4.0000 mg | Freq: Once | INTRAMUSCULAR | Status: AC
  Administered 2011-05-15: 4 mg via INTRAVENOUS
  Filled 2011-05-15: qty 2

## 2011-05-15 MED ORDER — ONDANSETRON HCL 4 MG/2ML IJ SOLN
INTRAMUSCULAR | Status: DC | PRN
  Administered 2011-05-15: 4 mg via INTRAVENOUS

## 2011-05-15 MED ORDER — SODIUM CHLORIDE 0.9 % IR SOLN
Status: DC | PRN
  Administered 2011-05-15: 1

## 2011-05-15 MED ORDER — OXYCODONE HCL 5 MG PO TABS
5.0000 mg | ORAL_TABLET | ORAL | Status: DC | PRN
  Administered 2011-05-15: 5 mg via ORAL
  Filled 2011-05-15: qty 1

## 2011-05-15 MED ORDER — DEXTROSE 5 % IV SOLN
2.0000 g | Freq: Four times a day (QID) | INTRAVENOUS | Status: AC
  Administered 2011-05-15 – 2011-05-16 (×4): 2 g via INTRAVENOUS
  Filled 2011-05-15 (×4): qty 2

## 2011-05-15 MED ORDER — HEMOSTATIC AGENTS (NO CHARGE) OPTIME
TOPICAL | Status: DC | PRN
  Administered 2011-05-15: 1 via TOPICAL

## 2011-05-15 MED ORDER — ACETAMINOPHEN 650 MG RE SUPP: 650.0000 mg | Freq: Four times a day (QID) | RECTAL | Status: DC | PRN

## 2011-05-15 MED ORDER — VECURONIUM BROMIDE 10 MG IV SOLR
INTRAVENOUS | Status: DC | PRN
  Administered 2011-05-15: 3 mg via INTRAVENOUS
  Administered 2011-05-15: 2 mg via INTRAVENOUS

## 2011-05-15 MED ORDER — ASPIRIN EC 81 MG PO TBEC
162.0000 mg | DELAYED_RELEASE_TABLET | Freq: Every day | ORAL | Status: DC
  Administered 2011-05-15 – 2011-05-16 (×2): 162 mg via ORAL
  Filled 2011-05-15 (×2): qty 2

## 2011-05-15 MED ORDER — ONDANSETRON HCL 4 MG/2ML IJ SOLN: 4.0000 mg | Freq: Once | INTRAMUSCULAR | Status: DC | PRN

## 2011-05-15 MED ORDER — INSULIN ASPART 100 UNIT/ML ~~LOC~~ SOLN: 10.0000 [IU] | Freq: Once | SUBCUTANEOUS | Status: DC

## 2011-05-15 MED ORDER — PROPOFOL 10 MG/ML IV EMUL
INTRAVENOUS | Status: DC | PRN
  Administered 2011-05-15: 180 mg via INTRAVENOUS

## 2011-05-15 MED ORDER — HYDROCODONE-ACETAMINOPHEN 5-325 MG PO TABS: 1.0000 | ORAL_TABLET | ORAL | Status: DC | PRN

## 2011-05-15 MED ORDER — CARVEDILOL 12.5 MG PO TABS
12.5000 mg | ORAL_TABLET | Freq: Two times a day (BID) | ORAL | Status: DC
  Administered 2011-05-15 – 2011-05-16 (×2): 12.5 mg via ORAL
  Filled 2011-05-15 (×4): qty 1

## 2011-05-15 MED ORDER — OXYBUTYNIN CHLORIDE 5 MG PO TABS
5.0000 mg | ORAL_TABLET | Freq: Every day | ORAL | Status: DC
  Administered 2011-05-15 – 2011-05-16 (×2): 5 mg via ORAL
  Filled 2011-05-15 (×2): qty 1

## 2011-05-15 MED ORDER — HYDROMORPHONE HCL PF 1 MG/ML IJ SOLN: 0.2500 mg | INTRAMUSCULAR | Status: DC | PRN

## 2011-05-15 MED ORDER — ONDANSETRON HCL 4 MG/2ML IJ SOLN
INTRAMUSCULAR | Status: AC
  Administered 2011-05-15: 4 mg via INTRAVENOUS
  Filled 2011-05-15: qty 2

## 2011-05-15 MED ORDER — ONDANSETRON HCL 4 MG/2ML IJ SOLN
4.0000 mg | Freq: Four times a day (QID) | INTRAMUSCULAR | Status: DC | PRN
  Administered 2011-05-15: 4 mg via INTRAVENOUS
  Filled 2011-05-15: qty 2

## 2011-05-15 MED ORDER — ACETAMINOPHEN 10 MG/ML IV SOLN
INTRAVENOUS | Status: DC | PRN
  Administered 2011-05-15: 1000 mg via INTRAVENOUS

## 2011-05-15 MED ORDER — DEXTROSE 5 % IV SOLN
2.0000 g | INTRAVENOUS | Status: AC
  Administered 2011-05-15: 2 g via INTRAVENOUS
  Filled 2011-05-15: qty 2

## 2011-05-15 MED ORDER — DROPERIDOL 2.5 MG/ML IJ SOLN
INTRAMUSCULAR | Status: DC | PRN
  Administered 2011-05-15: 0.625 mg via INTRAVENOUS

## 2011-05-15 MED ORDER — MORPHINE SULFATE 2 MG/ML IJ SOLN
2.0000 mg | Freq: Once | INTRAMUSCULAR | Status: AC
  Administered 2011-05-15: 2 mg via INTRAVENOUS
  Filled 2011-05-15: qty 1

## 2011-05-15 MED ORDER — ACETAMINOPHEN 10 MG/ML IV SOLN
INTRAVENOUS | Status: AC
  Filled 2011-05-15: qty 100

## 2011-05-15 MED ORDER — HYDROMORPHONE HCL PF 1 MG/ML IJ SOLN
INTRAMUSCULAR | Status: AC
  Filled 2011-05-15: qty 1

## 2011-05-15 MED ORDER — ONDANSETRON HCL 4 MG/2ML IJ SOLN
4.0000 mg | Freq: Once | INTRAMUSCULAR | Status: AC
  Administered 2011-05-15: 4 mg via INTRAVENOUS

## 2011-05-15 MED ORDER — HYDROMORPHONE HCL PF 1 MG/ML IJ SOLN
1.0000 mg | Freq: Once | INTRAMUSCULAR | Status: AC
  Administered 2011-05-15: 1 mg via INTRAVENOUS
  Filled 2011-05-15: qty 1

## 2011-05-15 MED ORDER — HYDROMORPHONE HCL PF 1 MG/ML IJ SOLN
0.5000 mg | Freq: Once | INTRAMUSCULAR | Status: AC
  Administered 2011-05-15: 0.5 mg via INTRAVENOUS
  Filled 2011-05-15: qty 1

## 2011-05-15 MED ORDER — IOHEXOL 300 MG/ML  SOLN
120.0000 mL | Freq: Once | INTRAMUSCULAR | Status: AC | PRN
  Administered 2011-05-15: 120 mL via INTRAVENOUS

## 2011-05-15 MED ORDER — SODIUM CHLORIDE 0.9 % IV SOLN
INTRAVENOUS | Status: DC
  Administered 2011-05-15 (×2): via INTRAVENOUS

## 2011-05-15 SURGICAL SUPPLY — 38 items
APPLIER CLIP 5 13 M/L LIGAMAX5 (MISCELLANEOUS) ×3
BLADE SURG ROTATE 9660 (MISCELLANEOUS) IMPLANT
CANISTER SUCTION 2500CC (MISCELLANEOUS) ×3 IMPLANT
CHLORAPREP W/TINT 26ML (MISCELLANEOUS) ×3 IMPLANT
CLIP APPLIE 5 13 M/L LIGAMAX5 (MISCELLANEOUS) ×2 IMPLANT
CLOTH BEACON ORANGE TIMEOUT ST (SAFETY) ×3 IMPLANT
COVER MAYO STAND STRL (DRAPES) ×3 IMPLANT
COVER SURGICAL LIGHT HANDLE (MISCELLANEOUS) ×3 IMPLANT
DECANTER SPIKE VIAL GLASS SM (MISCELLANEOUS) IMPLANT
DERMABOND ADHESIVE PROPEN (GAUZE/BANDAGES/DRESSINGS) ×1
DERMABOND ADVANCED (GAUZE/BANDAGES/DRESSINGS) ×1
DERMABOND ADVANCED .7 DNX12 (GAUZE/BANDAGES/DRESSINGS) ×2 IMPLANT
DERMABOND ADVANCED .7 DNX6 (GAUZE/BANDAGES/DRESSINGS) ×2 IMPLANT
DRAPE C-ARM 42X72 X-RAY (DRAPES) ×3 IMPLANT
ELECT REM PT RETURN 9FT ADLT (ELECTROSURGICAL) ×3
ELECTRODE REM PT RTRN 9FT ADLT (ELECTROSURGICAL) ×2 IMPLANT
ENDOLOOP SUT PDS II  0 18 (SUTURE) ×1
ENDOLOOP SUT PDS II 0 18 (SUTURE) ×2 IMPLANT
GLOVE BIO SURGEON STRL SZ7 (GLOVE) ×6 IMPLANT
GLOVE BIOGEL PI IND STRL 7.5 (GLOVE) ×4 IMPLANT
GLOVE BIOGEL PI INDICATOR 7.5 (GLOVE) ×2
GOWN STRL NON-REIN LRG LVL3 (GOWN DISPOSABLE) ×12 IMPLANT
KIT BASIN OR (CUSTOM PROCEDURE TRAY) ×3 IMPLANT
KIT ROOM TURNOVER OR (KITS) ×3 IMPLANT
NS IRRIG 1000ML POUR BTL (IV SOLUTION) ×3 IMPLANT
PAD ARMBOARD 7.5X6 YLW CONV (MISCELLANEOUS) ×6 IMPLANT
POUCH SPECIMEN RETRIEVAL 10MM (ENDOMECHANICALS) ×3 IMPLANT
SCISSORS LAP 5X35 DISP (ENDOMECHANICALS) IMPLANT
SET CHOLANGIOGRAPH 5 50 .035 (SET/KITS/TRAYS/PACK) IMPLANT
SET IRRIG TUBING LAPAROSCOPIC (IRRIGATION / IRRIGATOR) ×3 IMPLANT
SLEEVE ENDOPATH XCEL 5M (ENDOMECHANICALS) ×6 IMPLANT
SPECIMEN JAR SMALL (MISCELLANEOUS) ×3 IMPLANT
SUT MNCRL AB 4-0 PS2 18 (SUTURE) ×3 IMPLANT
TOWEL OR 17X24 6PK STRL BLUE (TOWEL DISPOSABLE) ×3 IMPLANT
TOWEL OR 17X26 10 PK STRL BLUE (TOWEL DISPOSABLE) ×3 IMPLANT
TRAY LAPAROSCOPIC (CUSTOM PROCEDURE TRAY) ×3 IMPLANT
TROCAR XCEL BLUNT TIP 100MML (ENDOMECHANICALS) ×3 IMPLANT
TROCAR XCEL NON-BLD 5MMX100MML (ENDOMECHANICALS) ×3 IMPLANT

## 2011-05-15 NOTE — ED Notes (Addendum)
C/o upper mid abd pain & cramping, (denies: nv, fever, sob, dizziness, leg cramping, bleeding or other sx), admits to watery diarrhea, 3 episodes in last 24 hrs. Wife at [facility].

## 2011-05-15 NOTE — ED Notes (Signed)
Increase in pain, rates 9.5/10, EDP back into room, orders received and initiated. meds given.

## 2011-05-15 NOTE — Anesthesia Procedure Notes (Signed)
Procedure Name: Intubation Date/Time: 05/15/2011 1:44 PM Performed by: Luster Landsberg Pre-anesthesia Checklist: Patient identified, Emergency Drugs available, Suction available and Patient being monitored Patient Re-evaluated:Patient Re-evaluated prior to inductionOxygen Delivery Method: Circle system utilized Preoxygenation: Pre-oxygenation with 100% oxygen Intubation Type: IV induction, Cricoid Pressure applied and Rapid sequence Laryngoscope Size: Mac and 4 Grade View: Grade I Tube type: Oral Tube size: 8.0 mm Number of attempts: 1 Airway Equipment and Method: Stylet Placement Confirmation: ETT inserted through vocal cords under direct vision,  positive ETCO2 and breath sounds checked- equal and bilateral Secured at: 23 cm Tube secured with: Tape Dental Injury: Teeth and Oropharynx as per pre-operative assessment

## 2011-05-15 NOTE — Transfer of Care (Signed)
Immediate Anesthesia Transfer of Care Note  Patient: [name] [name]  Procedure(s) Performed: Procedure(s) (LRB): LAPAROSCOPIC CHOLECYSTECTOMY (N/A)  Patient Location: PACU  Anesthesia Type: General  Level of Consciousness: oriented, sedated, patient cooperative and responds to stimulation  Airway & Oxygen Therapy: Patient Spontanous Breathing and Patient connected to nasal cannula oxygen  Post-op Assessment: Report given to PACU RN, Post -op Vital signs reviewed and stable, Patient moving all extremities and Patient moving all extremities X 4  Post vital signs: Reviewed and stable  Complications: No apparent anesthesia complications

## 2011-05-15 NOTE — ED Provider Notes (Signed)
History     CSN: [id number]  Arrival date & time 05/15/11  0448   First MD Initiated Contact with Patient 05/15/11 0515      Chief Complaint  Patient presents with  . Abdominal Pain    (Consider location/radiation/quality/duration/timing/severity/associated sxs/prior treatment) HPI Complains of epigastric pain stabbing in nature nonradiating onset yesterday afternoon after eating hamburgers pain has subsided somewhat spontaneously , however became more severe at 2 AM today pain is constant nonradiating treated with Excedrin without relief. Had similar pain 20 years ago etiology unclear. Associated symptoms include 2 or 3 episodes of diarrhea no blood per rectum and slight nausea no other associated symptoms pain is moderate to severe at present Past Medical History  Diagnosis Date  . Coronary artery disease   . Hyperlipidemia   . Hypertension   . Type II or unspecified type diabetes mellitus without mention of complication, not stated as uncontrolled 2000  . Hypertrophy of prostate without urinary obstruction and other lower urinary tract symptoms (LUTS)   . GERD (gastroesophageal reflux disease)   . Perirectal fistula     Past Surgical History  Procedure Date  . Coronary artery bypass graft 2007    x 4  . Foot surgery 2012    right foot  . Lumbar laminectomy 1989  . Coronary angioplasty with stent placement     before CABG  . Joint replacement 8/10    Right THR--[name]  . Colonoscopy w/ polypectomy     3 weeks ago    Family History  Problem Relation Age of Onset  . Cancer Mother   . Heart disease Father     History  Substance Use Topics  . Smoking status: Never Smoker   . Smokeless tobacco: Never Used  . Alcohol Use: Yes     occ cocktail      Review of Systems  Constitutional: Negative.   HENT: Negative.   Respiratory: Negative.   Cardiovascular: Negative.   Gastrointestinal: Positive for nausea, abdominal pain and diarrhea.  Musculoskeletal:  Negative.   Skin: Negative.   Neurological: Negative.   Hematological: Negative.   Psychiatric/Behavioral: Negative.   All other systems reviewed and are negative.    Allergies  Review of patient's allergies indicates no known allergies.  Home Medications   Current Outpatient Rx  Name Route Sig Dispense Refill  . ASPIRIN 81 MG PO TABS Oral Take 162 mg by mouth daily.     . ATORVASTATIN CALCIUM 40 MG PO TABS Oral Take 20 mg by mouth daily.    Marland Kitchen BIOTIN 1000 MCG PO TABS Oral Take 1,000 mcg by mouth daily.    Marland Kitchen CARVEDILOL 12.5 MG PO TABS Oral Take 1 tablet (12.5 mg total) by mouth 2 (two) times daily with a meal. 180 tablet 3  . VITAMIN D3 2000 UNITS PO TABS Oral Take 2,000 Units by mouth every morning.     Marland Kitchen FINASTERIDE 5 MG PO TABS Oral Take 1 tablet (5 mg total) by mouth daily. 90 tablet 3  . FLAXSEED OIL 1000 MG PO CAPS Oral Take 1 capsule by mouth every morning.     Marland Kitchen GLIMEPIRIDE 1 MG PO TABS Oral Take 1 tablet (1 mg total) by mouth daily before breakfast. 90 tablet 3  . GLUCOSAMINE 1500 COMPLEX PO CAPS Oral Take 1 capsule by mouth daily.    . INSULIN DETEMIR 100 UNIT/ML [place] SOLN Subcutaneous Inject 38 Units into the skin at bedtime.     . CENTRUM SILVER PO Oral Take by  mouth.      . OMEPRAZOLE 20 MG PO CPDR Oral Take 1 capsule (20 mg total) by mouth 2 (two) times daily. 60 capsule 11    On empty stomach  . OXYBUTYNIN CHLORIDE 5 MG PO TABS Oral Take 5 mg by mouth daily.     Marland Kitchen RAMIPRIL 10 MG PO CAPS Oral Take 1 capsule (10 mg total) by mouth daily. 90 capsule 3  . TOBRAMYCIN-DEXAMETHASONE 0.3-0.1 % OP SUSP Left Eye Place 1 drop into the left eye 2 (two) times daily.       BP 193/70  Pulse 60  Temp(Src) 97.5 F (36.4 C) (Oral)  Resp 24  SpO2 99%  Physical Exam  Nursing note and vitals reviewed. Constitutional: He appears well-developed and well-nourished.  HENT:  Head: Normocephalic and atraumatic.  Eyes: Conjunctivae are normal. Pupils are equal, round, and reactive to  light.  Neck: Neck supple. No tracheal deviation present. No thyromegaly present.  Cardiovascular: Regular rhythm.   No murmur heard.      Bradycardia  Pulmonary/Chest: Effort normal and breath sounds normal.  Abdominal: Soft. Bowel sounds are normal. He exhibits no distension. There is tenderness. There is guarding.  Genitourinary: Penis normal.  Musculoskeletal: Normal range of motion. He exhibits no edema and no tenderness.  Neurological: He is alert. Coordination normal.  Skin: Skin is warm and dry. No rash noted.  Psychiatric: He has a normal mood and affect.    ED Course  Procedures (including critical care time) 6:20 AM pain and nausea improved however patient request more pain medicine. Additional morphine ordered. 7:25 AM requesting more pain medicine.Dilaudid ordered. Patient placed in CDU pending diagnostic testing results.Dr. [name] aware of pt Labs Reviewed - No data to display No results found.  Date: 05/15/2011  Rate: 45  Rhythm: sinus bradycardia  QRS Axis: normal  Intervals: normal  ST/T Wave abnormalities: normal  Conduction Disutrbances:none  Narrative Interpretation:   Old EKG Reviewed: none available  Results for orders placed during the hospital encounter of 05/15/11  CBC      Component Value Range   WBC 8.8  4.0 - 10.5 (K/uL)   RBC 4.80  4.22 - 5.81 (MIL/uL)   Hemoglobin 15.3  13.0 - 17.0 (g/dL)   HCT 40.9  81.1 - 91.4 (%)   MCV 86.7  78.0 - 100.0 (fL)   MCH 31.9  26.0 - 34.0 (pg)   MCHC 36.8 (*) 30.0 - 36.0 (g/dL)   RDW 78.2  95.6 - 21.3 (%)   Platelets 122 (*) 150 - 400 (K/uL)  DIFFERENTIAL      Component Value Range   Neutrophils Relative 60  43 - 77 (%)   Neutro Abs 5.3  1.7 - 7.7 (K/uL)   Lymphocytes Relative 24  12 - 46 (%)   Lymphs Abs 2.2  0.7 - 4.0 (K/uL)   Monocytes Relative 11  3 - 12 (%)   Monocytes Absolute 0.9  0.1 - 1.0 (K/uL)   Eosinophils Relative 4  0 - 5 (%)   Eosinophils Absolute 0.4  0.0 - 0.7 (K/uL)   Basophils Relative  1  0 - 1 (%)   Basophils Absolute 0.1  0.0 - 0.1 (K/uL)  COMPREHENSIVE METABOLIC PANEL      Component Value Range   Sodium 137  135 - 145 (mEq/L)   Potassium 4.2  3.5 - 5.1 (mEq/L)   Chloride 104  96 - 112 (mEq/L)   CO2 23  19 - 32 (mEq/L)   Glucose,  Bld 266 (*) 70 - 99 (mg/dL)   BUN 16  6 - 23 (mg/dL)   Creatinine, Ser 1.61  0.50 - 1.35 (mg/dL)   Calcium 9.4  8.4 - 09.6 (mg/dL)   Total Protein 6.3  6.0 - 8.3 (g/dL)   Albumin 3.6  3.5 - 5.2 (g/dL)   AST 22  0 - 37 (U/L)   ALT 18  0 - 53 (U/L)   Alkaline Phosphatase 154 (*) 39 - 117 (U/L)   Total Bilirubin 0.7  0.3 - 1.2 (mg/dL)   GFR calc non Af Amer 67 (*) >90 (mL/min)   GFR calc Af Amer 78 (*) >90 (mL/min)  LIPASE, BLOOD      Component Value Range   Lipase 30  11 - 59 (U/L)   No results found.  No diagnosis found.  MDM  CT scan diagnostics pending Pain and physical exam atypical for biliary colic Diagnosis #1abdominal pain  #2hyperglycemia        Doug Sou, MD 05/15/11 [phone]

## 2011-05-15 NOTE — ED Notes (Signed)
PT. REPORTS UPPER/MID ABDOMINAL PAIN ONSET LAST FRIADY , DENIES NAUSEA OR VOMITTING , SLIGHT DIARRHEA WITH NO FEVER OR CHILLS.

## 2011-05-15 NOTE — Anesthesia Preprocedure Evaluation (Addendum)
Anesthesia Evaluation  Patient identified by MRN, date of birth, ID band Patient awake    Reviewed: Allergy & Precautions, H&P , NPO status , Patient's Chart, lab work & pertinent test results, reviewed documented beta blocker date and time   Airway Mallampati: II TM Distance: >3 FB Neck ROM: Full    Dental  (+) Edentulous Upper and Partial Lower   Pulmonary  breath sounds clear to auscultation        Cardiovascular hypertension, Pt. on medications and Pt. on home beta blockers + CAD Rhythm:Regular Rate:Normal     Neuro/Psych    GI/Hepatic GERD-  Medicated and Controlled,  Endo/Other  Diabetes mellitus-, Well Controlled, Type 2, Oral Hypoglycemic Agents and Insulin Dependent  Renal/GU      Musculoskeletal   Abdominal   Peds  Hematology   Anesthesia Other Findings   Reproductive/Obstetrics                          Anesthesia Physical Anesthesia Plan  ASA: III  Anesthesia Plan: General   Post-op Pain Management:    Induction: Intravenous  Airway Management Planned: Oral ETT  Additional Equipment:   Intra-op Plan:   Post-operative Plan: Extubation in OR  Informed Consent: I have reviewed the patients History and Physical, chart, labs and discussed the procedure including the risks, benefits and alternatives for the proposed anesthesia with the patient or authorized representative who has indicated his/her understanding and acceptance.   Dental advisory given  Plan Discussed with: CRNA and Anesthesiologist  Anesthesia Plan Comments: (78 Y/O WM with acute symptomatic cholelithiasis  CAD s/p CABG 01/2004,  NL LV function by ECHO 5/11, cardiac CT 4/09: grafts patent Type 2 DM glucose 267 htn  Plan GA with RSI )      Anesthesia Quick Evaluation

## 2011-05-15 NOTE — ED Provider Notes (Signed)
PCP [facility] D/w Surg will see Pt in ED for [phone]  Medical screening examination/treatment/procedure(s) were conducted as a shared visit with non-physician practitioner(s) and myself.  I personally evaluated the patient during the encounter  Hurman Horn, MD 05/15/11 1212

## 2011-05-15 NOTE — ED Notes (Signed)
EDP into room, wife at [facility], pt on [facility].

## 2011-05-15 NOTE — ED Notes (Signed)
Report given to [place] & [place] RNs, pt going to CDU, wife remains at [facility], pt reports pain increasing, EDP aware, pain med orders received and initiated.

## 2011-05-15 NOTE — H&P (Signed)
[name] [name] is an 78 y.o. male.   Chief Complaint: abdominal pain Referred by Dr. Wayland Salinas HPI:  19 yom with history of CAD s/p CAB 2005 who has been doing well from his heart.  Since Friday after eating burgers he has had right sided abdominal pain that has been worsening.  This was made worse with eating.  It has not gotten better and he has gotten no relief.  He denies fevers.  He has had some nausea and emesis associated with this.  Upon arrival he has had a CT scan that shows gallstones.  I was consulted for evaluation of likely symptomatic cholelithiasis.  Past Medical History  Diagnosis Date  . Coronary artery disease   . Hyperlipidemia   . Hypertension   . Type II or unspecified type diabetes mellitus without mention of complication, not stated as uncontrolled 2000  . Hypertrophy of prostate without urinary obstruction and other lower urinary tract symptoms (LUTS)   . GERD (gastroesophageal reflux disease)   . Perirectal fistula     Past Surgical History  Procedure Date  . Coronary artery bypass graft 2007    x 4  . Foot surgery 2012    right foot  . Lumbar laminectomy 1989  . Coronary angioplasty with stent placement     before CABG  . Joint replacement 8/10    Right THR--[name]  . Colonoscopy w/ polypectomy     3 weeks ago    Family History  Problem Relation Age of Onset  . Cancer Mother   . Heart disease Father    Social History:  reports that he has never smoked. He has never used smokeless tobacco. He reports that he drinks alcohol. He reports that he does not use illicit drugs.  Allergies: No Known Allergies  Medications Prior to Admission  Medication Dose Route Frequency Provider Last Rate Last Dose  . 0.9 %  sodium chloride infusion   Intravenous Continuous Doug Sou, MD 100 mL/hr at 05/15/11 0550    . cefOXitin (MEFOXIN) 2 g in dextrose 5 % 50 mL IVPB  2 g Intravenous Once Emelia Loron, MD      . HYDROmorphone (DILAUDID) injection 0.5  mg  0.5 mg Intravenous Once Rodena Medin, PA-C   0.5 mg at 05/15/11 0842  . HYDROmorphone (DILAUDID) injection 0.5 mg  0.5 mg Intravenous Once Rodena Medin, PA-C   0.5 mg at 05/15/11 0925  . HYDROmorphone (DILAUDID) injection 1 mg  1 mg Intravenous Once Doug Sou, MD   1 mg at 05/15/11 0739  . iohexol (OMNIPAQUE) 300 MG/ML solution 120 mL  120 mL Intravenous Once PRN Medication Radiologist, MD   120 mL at 05/15/11 1013  . iohexol (OMNIPAQUE) 300 MG/ML solution 20 mL  20 mL Oral Q1 Hr x 2 Medication Radiologist, MD   20 mL at 05/15/11 0715  . morphine 2 MG/ML injection 2 mg  2 mg Intravenous Once Emelia Loron, MD      . morphine 4 MG/ML injection 4 mg  4 mg Intravenous Once Doug Sou, MD   4 mg at 05/15/11 0549  . morphine 4 MG/ML injection 4 mg  4 mg Intravenous Once Doug Sou, MD   4 mg at 05/15/11 0645  . ondansetron (ZOFRAN) injection 4 mg  4 mg Intravenous Once Doug Sou, MD   4 mg at 05/15/11 0550  . ondansetron (ZOFRAN) injection 4 mg  4 mg Intravenous Once Rodena Medin, PA-C   4 mg at  05/15/11 0929  . ondansetron (ZOFRAN) injection 4 mg  4 mg Intravenous Once Emelia Loron, MD   4 mg at 05/15/11 1250   Medications Prior to Admission  Medication Sig Dispense Refill  . aspirin 81 MG tablet Take 162 mg by mouth daily.       Marland Kitchen atorvastatin (LIPITOR) 40 MG tablet Take 20 mg by mouth daily.      . carvedilol (COREG) 12.5 MG tablet Take 1 tablet (12.5 mg total) by mouth 2 (two) times daily with a meal.  180 tablet  3  . Cholecalciferol (VITAMIN D3) 2000 UNITS TABS Take 2,000 Units by mouth every morning.       . finasteride (PROSCAR) 5 MG tablet Take 1 tablet (5 mg total) by mouth daily.  90 tablet  3  . Flaxseed, Linseed, (FLAXSEED OIL) 1000 MG CAPS Take 1 capsule by mouth every morning.       Marland Kitchen glimepiride (AMARYL) 1 MG tablet Take 1 tablet (1 mg total) by mouth daily before breakfast.  90 tablet  3  . insulin detemir (LEVEMIR) 100 UNIT/ML injection Inject  38 Units into the skin at bedtime.       . Multiple Vitamins-Minerals (CENTRUM SILVER PO) Take by mouth.        Marland Kitchen omeprazole (PRILOSEC) 20 MG capsule Take 1 capsule (20 mg total) by mouth 2 (two) times daily.  60 capsule  11  . oxybutynin (DITROPAN) 5 MG tablet Take 5 mg by mouth daily.       . ramipril (ALTACE) 10 MG capsule Take 1 capsule (10 mg total) by mouth daily.  90 capsule  3  . tobramycin-dexamethasone (TOBRADEX) ophthalmic solution Place 1 drop into the left eye 2 (two) times daily.         Results for orders placed during the hospital encounter of 05/15/11 (from the past 48 hour(s))  CBC     Status: Abnormal   Collection Time   05/15/11  5:34 AM      Component Value Range Comment   WBC 8.8  4.0 - 10.5 (K/uL)    RBC 4.80  4.22 - 5.81 (MIL/uL)    Hemoglobin 15.3  13.0 - 17.0 (g/dL)    HCT 16.1  09.6 - 04.5 (%)    MCV 86.7  78.0 - 100.0 (fL)    MCH 31.9  26.0 - 34.0 (pg)    MCHC 36.8 (*) 30.0 - 36.0 (g/dL)    RDW 40.9  81.1 - 91.4 (%)    Platelets 122 (*) 150 - 400 (K/uL)   DIFFERENTIAL     Status: Normal   Collection Time   05/15/11  5:34 AM      Component Value Range Comment   Neutrophils Relative 60  43 - 77 (%)    Neutro Abs 5.3  1.7 - 7.7 (K/uL)    Lymphocytes Relative 24  12 - 46 (%)    Lymphs Abs 2.2  0.7 - 4.0 (K/uL)    Monocytes Relative 11  3 - 12 (%)    Monocytes Absolute 0.9  0.1 - 1.0 (K/uL)    Eosinophils Relative 4  0 - 5 (%)    Eosinophils Absolute 0.4  0.0 - 0.7 (K/uL)    Basophils Relative 1  0 - 1 (%)    Basophils Absolute 0.1  0.0 - 0.1 (K/uL)   COMPREHENSIVE METABOLIC PANEL     Status: Abnormal   Collection Time   05/15/11  5:34 AM  Component Value Range Comment   Sodium 137  135 - 145 (mEq/L)    Potassium 4.2  3.5 - 5.1 (mEq/L)    Chloride 104  96 - 112 (mEq/L)    CO2 23  19 - 32 (mEq/L)    Glucose, Bld 266 (*) 70 - 99 (mg/dL)    BUN 16  6 - 23 (mg/dL)    Creatinine, Ser 1.61  0.50 - 1.35 (mg/dL)    Calcium 9.4  8.4 - 10.5 (mg/dL)     Total Protein 6.3  6.0 - 8.3 (g/dL)    Albumin 3.6  3.5 - 5.2 (g/dL)    AST 22  0 - 37 (U/L) HEMOLYSIS AT THIS LEVEL MAY AFFECT RESULT   ALT 18  0 - 53 (U/L)    Alkaline Phosphatase 154 (*) 39 - 117 (U/L)    Total Bilirubin 0.7  0.3 - 1.2 (mg/dL)    GFR calc non Af Amer 67 (*) >90 (mL/min)    GFR calc Af Amer 78 (*) >90 (mL/min)   LIPASE, BLOOD     Status: Normal   Collection Time   05/15/11  5:34 AM      Component Value Range Comment   Lipase 30  11 - 59 (U/L)   POCT I-STAT TROPONIN I     Status: Normal   Collection Time   05/15/11  7:49 AM      Component Value Range Comment   Troponin i, poc 0.00  0.00 - 0.08 (ng/mL)    Comment 3             Ct Abdomen Pelvis W Contrast  05/15/2011  *RADIOLOGY REPORT*  Clinical Data: 78 year old male with abdominal and pelvic pain.  CT ABDOMEN AND PELVIS WITH CONTRAST  Technique:  Multidetector CT imaging of the abdomen and pelvis was performed following the standard protocol during bolus administration of intravenous contrast.  Contrast: OMNIPAQUE IOHEXOL 300 MG/ML IJ SOLN  Comparison: None  Findings: The lung bases are clear.  The liver, adrenal glands, pancreas and kidneys are unremarkable except for bilateral renal cortical atrophy and left renal cysts. A 1.5 cm calcified gallstone is identified with equivocal mild gallbladder wall thickening.  Splenomegaly is identified with a splenic volume of 800 ml.  No free fluid, enlarged lymph nodes, biliary dilation or abdominal aortic aneurysm identified. The appendix, bladder and bowel are unremarkable.  Marked prostate enlargement is noted.  A 1 cm dense sclerotic lesion within the left L3-8 articular mass probably represents a bone island but consider further evaluation if the patient has a history prostate cancer elevated PSA. Right total hip arthroplasty is noted as well as moderate degenerative changes throughout the lumbar spine.  IMPRESSION: 1.5 cm gallstone with equivocal gallbladder wall thickening.   Acute cholecystitis is difficult to exclude and consider further evaluation with ultrasound or nuclear medicine study as clinically indicated.  Splenomegaly with splenic volume of 800 ml.  Marked prostate enlargement.  1 cm sclerotic lesion within the left L3 posterior element - this likely represents a bone island but if the patient has a history of prostate cancer or elevated PSA, consider further evaluation with whole body bone scan.  Original Report Authenticated By: Rosendo Gros, M.D.    Review of Systems  Constitutional: Negative for fever and chills.  Respiratory: Negative for cough.   Cardiovascular: Negative for chest pain and leg swelling.  Gastrointestinal: Positive for nausea, vomiting and abdominal pain. Negative for diarrhea and constipation.    Blood  pressure 113/27, pulse 54, temperature 97.4 F (36.3 C), temperature source Oral, resp. rate 16, SpO2 93.00%. Physical Exam  Vitals reviewed. Constitutional: He appears well-developed and well-nourished. No distress.  Eyes: No scleral icterus.  Neck: Neck supple.  Cardiovascular: Normal rate, regular rhythm and normal heart sounds.   Respiratory: Effort normal and breath sounds normal. He has no wheezes. He has no rales.  GI: Soft. Normal appearance and bowel sounds are normal. There is no hepatomegaly. There is tenderness in the right upper quadrant. There is positive Murphy's sign.  Lymphadenopathy:    He has no cervical adenopathy.     Assessment/Plan Cholecystitis  On exam he has cholecystitis although ct does not show this and wbc normal.  He does have gallstones and clearly is symptomatic from that right now.  I recommended admission and proceeding for laparoscopic cholecystectomy. We discussed the risks and benefits of a laparoscopic cholecystectomy and possible cholangiogram including, but not limited to bleeding, infection, injury to surrounding structures such as the intestine or liver, bile leak, retained  gallstones, need to convert to an open procedure, prolonged diarrhea, blood clots such as  DVT, common bile duct injury, anesthesia risks, and possible need for additional procedures.  The likelihood of improvement in symptoms and return to the patient's normal status is good. We discussed the typical post-operative recovery course.   Elyzabeth Goatley 05/15/2011, 12:53 PM

## 2011-05-15 NOTE — ED Notes (Signed)
Pt moved to CDU to finish contrast and have CT.

## 2011-05-15 NOTE — Op Note (Signed)
Preoperative diagnosis: Acute cholecystitis Postoperative diagnosis: Same as above Procedure: Laparoscopic cholecystectomy Surgeon: Dr. Harden Mo Asst.: Dr. Wenda Low Estimated blood loss: minimal Complications: None Specimens: Gallbladder and contents to pathology Drains: None Sponge and needle count correct x2 at end of operation Anesthesia: Gen. Disposition the patient recovery in stable condition  Indications: This is a 78 year old male with a history of coronary artery disease status post a bypass who developed right upper quadrant pain on Friday. This has worsened over the weekend and he clearly has right upper quadrant pain on his exam. A CT scan showed a gallstone. I discussed with he and his wife a laparoscopic cholecystectomy we decided to proceed.  Procedure: After informed consent was obtained the patient was taken to the operating room. He received 1 g of intravenous cefoxitin. Sequential compression devices were placed on his legs prior to induction of anesthesia. He was then placed under general anesthesia without complication. His abdomen was then prepped and draped in the standard sterile surgical fashion. A surgical timeout was then performed.  I infiltrated quarter percent Marcaine below his umbilicus. I made a vertical incision. I grasped his umbilical stalk with a clamp. I incised this fascia and then entered his peritoneum bluntly. A 0 Vicryl purse string suture was placed to the fascia. A Hasson trocar was introduced and the abdomen was insufflated to 15 mmHg pressure. I then inserted 3 further 5 mm trocars into his epigastrium and right upper quadrant under direct vision after infiltration with local anesthetic without complication. The gallbladder was noted to be tense. I aspirated a fair amount of thick bile as well as what was whitish-looking fluid. The gallbladder was retracted cephalad. There were a lot of adhesions to his omentum and duodenum that were taken down  with a combination of blunt dissection and cautery. Eventually were able to retract the gallbladder cephalad and lateral. There was a lot of inflammation. Eventually I was able to obtain the critical view of safety. I attempted to perform a cholangiogram I was not able to pass the catheter and I did get a couple of stones back from distally. Eventually I just clipped the artery and then the duct and divided them. I did place an Endoloop over the duct as well. I then removed the gallbladder from the liver bed with some difficulty as it was adherent. Following this I placed it in a Endo Catch and removed from the umbilicus. Hemostasis was obtained. Copious irrigation was performed until this was clear. Do his aspirin use I did place a piece of Surgicel Snow in the liver bed. I then removed [place] on trocar. I tied down my suture. I did place 2 further 0 Vicryl figure-of-eight sutures in the umbilicus to completely obliterate this defect. I then desufflated the abdomen and removed all trocars. Incisions were closed with 4-0 Monocryl and Dermabond. He tolerated this well was extubated and transferred to recovery stable.

## 2011-05-15 NOTE — Anesthesia Postprocedure Evaluation (Signed)
  Anesthesia Post-op Note  Patient: [name] [name]  Procedure(s) Performed: Procedure(s) (LRB): LAPAROSCOPIC CHOLECYSTECTOMY (N/A)  Patient Location: PACU  Anesthesia Type: General  Level of Consciousness: awake, alert  and oriented  Airway and Oxygen Therapy: Patient Spontanous Breathing and Patient connected to nasal cannula oxygen  Post-op Pain: mild  Post-op Assessment: Post-op Vital signs reviewed and Patient's Cardiovascular Status Stable  Post-op Vital Signs: stable  Complications: No apparent anesthesia complications

## 2011-05-15 NOTE — Discharge Instructions (Signed)
[facility] LAPAROSCOPIC SURGERY: POST OP INSTRUCTIONS  Always review your discharge instruction sheet given to you by the facility where your surgery was performed. IF YOU HAVE DISABILITY OR FAMILY LEAVE FORMS, YOU MUST BRING THEM TO THE OFFICE FOR PROCESSING.   DO NOT GIVE THEM TO YOUR DOCTOR.  1. A prescription for pain medication may be given to you upon discharge.  Take your pain medication as prescribed, if needed.  If narcotic pain medicine is not needed, then you may take acetaminophen (Tylenol), naprosyn (Alleve), or ibuprofen (Advil) as needed. 2. Take your usually prescribed medications unless otherwise directed. 3. If you need a refill on your pain medication, please contact your pharmacy.  They will contact our office to request authorization. Prescriptions will not be filled after 5pm or on week-ends. 4. You should follow a light diet the first few days after arrival home, such as soup and crackers, etc.  Be sure to include lots of fluids daily. 5. Most patients will experience some swelling and bruising in the area of the incisions.  Ice packs will help.  Swelling and bruising can take several days to resolve.  6. It is common to experience some constipation if taking pain medication after surgery.  Increasing fluid intake and taking a stool softener (such as Colace) will usually help or prevent this problem from occurring.  A mild laxative (Milk of Magnesia or Miralax) should be taken according to package instructions if there are no bowel movements after 48 hours. 7. Unless discharge instructions indicate otherwise, you may remove your bandages 48 hours after surgery, and you may shower at that time.  You may have steri-strips (small skin tapes) in place directly over the incision.  These strips should be left on the skin for 7-10 days.  If your surgeon used skin glue on the incision, you may shower in 24 hours.  The glue will flake  off over the next 2-3 weeks.  Any sutures or staples will be removed at the office during your follow-up visit. 8. ACTIVITIES:  You may resume regular (light) daily activities beginning the next day--such as daily self-care, walking, climbing stairs--gradually increasing activities as tolerated.  You may have sexual intercourse when it is comfortable.  Refrain from any heavy lifting or straining until approved by your doctor. a. You may drive when you are no longer taking prescription pain medication, you can comfortably wear a seatbelt, and you can safely maneuver your car and apply brakes. b. RETURN TO WORK:  __________________________________________________________ 9. You should see your doctor in the office for a follow-up appointment approximately 2-3 weeks after your surgery.  Make sure that you call for this appointment within a day or two after you arrive home to insure a convenient appointment time. 10. OTHER INSTRUCTIONS: __________________________________________________________________________________________________________________________ __________________________________________________________________________________________________________________________ WHEN TO CALL YOUR DOCTOR: 1. Fever over 101.0 2. Inability to urinate 3. Continued bleeding from incision. 4. Increased pain, redness, or drainage from the incision. 5. Increasing abdominal pain  The clinic staff is available to answer your questions during regular business hours.  Please don't hesitate to call and ask to speak to one of the nurses for clinical concerns.  If you have a medical emergency, go to the nearest emergency room or call 911.  A surgeon from [facility] is always on call at the hospital. [address], [address], [place], [place]  [address] ? [address] [address], [place], [place]   [address] [phone] ? [phone] ? [phone]   [phone] Web site: [email or web address]  

## 2011-05-16 ENCOUNTER — Encounter (HOSPITAL_COMMUNITY): Payer: Self-pay | Admitting: General Surgery

## 2011-05-16 DIAGNOSIS — R339 Retention of urine, unspecified: Secondary | ICD-10-CM

## 2011-05-16 LAB — CBC
Platelets: 94 10*3/uL — ABNORMAL LOW (ref 150–400)
RDW: 12.9 % (ref 11.5–15.5)
WBC: 10.1 10*3/uL (ref 4.0–10.5)

## 2011-05-16 LAB — COMPREHENSIVE METABOLIC PANEL
AST: 90 U/L — ABNORMAL HIGH (ref 0–37)
Albumin: 3 g/dL — ABNORMAL LOW (ref 3.5–5.2)
Alkaline Phosphatase: 148 U/L — ABNORMAL HIGH (ref 39–117)
Chloride: 106 mEq/L (ref 96–112)
Creatinine, Ser: 1.13 mg/dL (ref 0.50–1.35)
Potassium: 3.9 mEq/L (ref 3.5–5.1)
Sodium: 138 mEq/L (ref 135–145)
Total Bilirubin: 1.1 mg/dL (ref 0.3–1.2)

## 2011-05-16 LAB — GLUCOSE, CAPILLARY: Glucose-Capillary: 241 mg/dL — ABNORMAL HIGH (ref 70–99)

## 2011-05-16 NOTE — Progress Notes (Signed)
Inpatient Diabetes Program Recommendations  AACE/ADA: New Consensus Statement on Inpatient Glycemic Control (2009)  Target Ranges:  Prepandial:   less than 140 mg/dL      Peak postprandial:   less than 180 mg/dL (1-2 hours)      Critically ill patients:  140 - 180 mg/dL   Reason for Visit: Hyperglycemia  Results for [name] [name], [name] [name] (MRN [id number]) as of 05/16/2011 10:31  Ref. Range 05/15/2011 15:03 05/15/2011 15:38 05/15/2011 21:14 05/16/2011 08:17  Glucose-Capillary Latest Range: 70-99 mg/dL 829 (H) 562 (H) 130 (H) 179 (H)    Inpatient Diabetes Program Recommendations Insulin - Basal: Add Levemir 20 units QHS (1/2 home dose) Diet: When diet advanced, CHO mod medium  Note: Will follow.

## 2011-05-16 NOTE — Progress Notes (Signed)
1 Day Post-Op  Subjective: Feels well, much better than when he came in.  Some abdominal soreness, but overall doing well.  Tolerating water and ice chips without nausea.  Did have 1 episode of nausea and vomiting yesterday after po pain medications.   Objective: Vital signs in last 24 hours: Temp:  [97.5 F (36.4 C)-99.4 F (37.4 C)] 99 F (37.2 C) (03/11 0516) Pulse Rate:  [47-97] 62  (03/11 0516) Resp:  [7-20] 17  (03/11 0516) BP: (113-190)/(27-70) 130/48 mmHg (03/11 0516) SpO2:  [91 %-100 %] 96 % (03/11 0516) Weight:  [219 lb (99.338 kg)] 219 lb (99.338 kg) (03/10 1600) Last BM Date: 05/15/11  Intake/Output from previous day: 03/10 0701 - 03/11 0700 In: 3912 [I.V.:3912] Out: 750 [Urine:750] Intake/Output this shift:    PE: Abd: soft, mild tenderness, +BS, incisions c/d/i with minimal bruising  Lab Results:   [facility] 05/15/11 0534  WBC 8.8  HGB 15.3  HCT 41.6  PLT 122*   BMET  Basename 05/15/11 0534  NA 137  K 4.2  CL 104  CO2 23  GLUCOSE 266*  BUN 16  CREATININE 1.05  CALCIUM 9.4   PT/INR No results found for this basename: LABPROT:2,INR:2 in the last 72 hours   Studies/Results: Ct Abdomen Pelvis W Contrast  05/15/2011  *RADIOLOGY REPORT*  Clinical Data: 78 year old male with abdominal and pelvic pain.  CT ABDOMEN AND PELVIS WITH CONTRAST  Technique:  Multidetector CT imaging of the abdomen and pelvis was performed following the standard protocol during bolus administration of intravenous contrast.  Contrast: OMNIPAQUE IOHEXOL 300 MG/ML IJ SOLN  Comparison: None  Findings: The lung bases are clear.  The liver, adrenal glands, pancreas and kidneys are unremarkable except for bilateral renal cortical atrophy and left renal cysts. A 1.5 cm calcified gallstone is identified with equivocal mild gallbladder wall thickening.  Splenomegaly is identified with a splenic volume of 800 ml.  No free fluid, enlarged lymph nodes, biliary dilation or abdominal aortic  aneurysm identified. The appendix, bladder and bowel are unremarkable.  Marked prostate enlargement is noted.  A 1 cm dense sclerotic lesion within the left L3-8 articular mass probably represents a bone island but consider further evaluation if the patient has a history prostate cancer elevated PSA. Right total hip arthroplasty is noted as well as moderate degenerative changes throughout the lumbar spine.  IMPRESSION: 1.5 cm gallstone with equivocal gallbladder wall thickening.  Acute cholecystitis is difficult to exclude and consider further evaluation with ultrasound or nuclear medicine study as clinically indicated.  Splenomegaly with splenic volume of 800 ml.  Marked prostate enlargement.  1 cm sclerotic lesion within the left L3 posterior element - this likely represents a bone island but if the patient has a history of prostate cancer or elevated PSA, consider further evaluation with whole body bone scan.  Original Report Authenticated By: Rosendo Gros, M.D.    Anti-infectives: Anti-infectives     Start     Dose/Rate Route Frequency Ordered Stop   05/15/11 1800   cefOXitin (MEFOXIN) 2 g in dextrose 5 % 50 mL IVPB        2 g 100 mL/hr over 30 Minutes Intravenous 4 times per day 05/15/11 1611 05/16/11 1759   05/15/11 1315   cefOXitin (MEFOXIN) 2 g in dextrose 5 % 50 mL IVPB        2 g 100 mL/hr over 30 Minutes Intravenous [facility] 05/15/11 1249 05/15/11 1348  Assessment/Plan 1. Acute cholecystitis 2. S/p laparoscopic cholecystectomy 2. BPH  Plan: 1. Patient tolerating clear liquids/ice chips, will advance to full liquids and monitor patient. No flatus or BM yet. 2. Foley catheter in place for difficulty urinating last night; patient with h/o BPH and does not think he received medications (oxybutynin and finasteride) for the past few days (although documented that he got them last night around 1700).  Will restart home meds and d/c foley later this AM.  Monitor  patient for UOP.  3. Consider d/c home this afternoon if tolerating full liquids and able to void spontaneously.   LOS: 1 day    Taye Cato 05/16/2011

## 2011-05-16 NOTE — Discharge Summary (Signed)
Patient ID: [name] [name] MRN: [id number] DOB/AGE: September 19, 1933 78 y.o.  Admit date: 05/15/2011 Discharge date: 05/16/2011  Procedures:  Laparoscopic cholecystectomy  Consults: None  Reason for Admission:  This is a 78 yo male who presented to the [facility] with RUQ abdominal pain.  His workup revealed acute cholecystitis.  Admission Diagnoses: 1. Acute cholecystitis Patient Active Problem List  Diagnoses  . HYPERLIPIDEMIA-MIXED  . HYPERTENSION, BENIGN  . CAD  . Hypertrophy of prostate without urinary obstruction and other lower urinary tract symptoms (LUTS)  . GERD (gastroesophageal reflux disease)  . Type II or unspecified type diabetes mellitus without mention of complication, not stated as uncontrolled  . Osteoarthrosis involving multiple sites  . External hemorrhoid  . Draining cutaneous sinus tract  . S/P CABG (coronary artery bypass graft)   Hospital Course: The patient was admitted and taken to the operating room where he underwent a laparoscopic cholecystectomy.  The patient tolerated the procedure well.  His diet was advanced as tolerated after surgery.  He did have some urinary retention.  He was started on his normal home medications for BPH and was able to have his foley removed.  He then was able to void on his own without difficulty.  At this time, he was felt stable for discharge home on POD# 1.  Discharge Diagnoses:  1. Acute cholecystitis, s/p lap chole Patient Active Problem List  Diagnoses  . HYPERLIPIDEMIA-MIXED  . HYPERTENSION, BENIGN  . CAD  . Hypertrophy of prostate without urinary obstruction and other lower urinary tract symptoms (LUTS)  . GERD (gastroesophageal reflux disease)  . Type II or unspecified type diabetes mellitus without mention of complication, not stated as uncontrolled  . Osteoarthrosis involving multiple sites  . External hemorrhoid  . Draining cutaneous sinus tract  . S/P CABG (coronary artery bypass graft)    Discharge  Medications: Medication List  As of 05/16/2011  3:00 PM   TAKE these medications         aspirin 81 MG tablet   Take 162 mg by mouth daily.      atorvastatin 40 MG tablet   Commonly known as: LIPITOR   Take 20 mg by mouth daily.      Biotin 1000 MCG tablet   Take 1,000 mcg by mouth daily.      carvedilol 12.5 MG tablet   Commonly known as: COREG   Take 1 tablet (12.5 mg total) by mouth 2 (two) times daily with a meal.      CENTRUM SILVER PO   Take by mouth.      finasteride 5 MG tablet   Commonly known as: PROSCAR   Take 1 tablet (5 mg total) by mouth daily.      Flaxseed Oil 1000 MG Caps   Take 1 capsule by mouth every morning.      glimepiride 1 MG tablet   Commonly known as: AMARYL   Take 1 tablet (1 mg total) by mouth daily before breakfast.      Glucosamine 1500 Complex Caps   Take 1 capsule by mouth daily.      insulin detemir 100 UNIT/ML injection   Commonly known as: LEVEMIR   Inject 38 Units into the skin at bedtime.      omeprazole 20 MG capsule   Commonly known as: PRILOSEC   Take 1 capsule (20 mg total) by mouth 2 (two) times daily.      oxybutynin 5 MG tablet   Commonly known as: DITROPAN   Take  5 mg by mouth daily.      ramipril 10 MG capsule   Commonly known as: ALTACE   Take 1 capsule (10 mg total) by mouth daily.      tobramycin-dexamethasone ophthalmic solution   Commonly known as: TOBRADEX   Place 1 drop into the left eye 2 (two) times daily.      Vitamin D3 2000 UNITS Tabs   Take 2,000 Units by mouth every morning.            Discharge Instructions: Follow-up Information    Follow up with Tillman Abide, MD. (As needed)       Follow up with [facility], [place]. Schedule an appointment as soon as possible for a visit in 3 weeks.   Contact information:   [employer] [address] [place] [address] [phone]          Signed: Letha Cape 05/16/2011, 3:00 PM

## 2011-05-16 NOTE — Progress Notes (Signed)
D/C home with wife.

## 2011-05-16 NOTE — Progress Notes (Signed)
Home today doing better.

## 2011-05-17 NOTE — Discharge Summary (Signed)
Doing well 

## 2011-05-17 NOTE — Progress Notes (Signed)
UR complete 

## 2011-05-19 ENCOUNTER — Telehealth (INDEPENDENT_AMBULATORY_CARE_PROVIDER_SITE_OTHER): Payer: Self-pay | Admitting: General Surgery

## 2011-05-19 NOTE — Telephone Encounter (Signed)
Pt of Dr. Dwain Sarna calling to schedule 3 weeks post op appt.  Please call him at [phone].

## 2011-05-20 ENCOUNTER — Encounter (INDEPENDENT_AMBULATORY_CARE_PROVIDER_SITE_OTHER): Payer: Medicare Other | Admitting: General Surgery

## 2011-06-16 ENCOUNTER — Encounter (INDEPENDENT_AMBULATORY_CARE_PROVIDER_SITE_OTHER): Payer: Medicare Other | Admitting: General Surgery

## 2011-06-24 ENCOUNTER — Encounter (INDEPENDENT_AMBULATORY_CARE_PROVIDER_SITE_OTHER): Payer: Self-pay | Admitting: General Surgery

## 2011-06-24 ENCOUNTER — Ambulatory Visit (INDEPENDENT_AMBULATORY_CARE_PROVIDER_SITE_OTHER): Payer: Medicare Other | Admitting: General Surgery

## 2011-06-24 VITALS — BP 138/68 | HR 62 | Temp 98.6°F | Resp 16 | Ht 74.0 in | Wt 218.1 lb

## 2011-06-24 DIAGNOSIS — Z09 Encounter for follow-up examination after completed treatment for conditions other than malignant neoplasm: Secondary | ICD-10-CM

## 2011-06-24 NOTE — Progress Notes (Signed)
Subjective:     Patient ID: [name] [name], male   DOB: [birth year], 78 y.o.   MRN: [id number]  HPI This is a 78 year old male who I met while he had acute cholecystitis and a laparoscopic cholecystectomy. He is recovering well from that. His LFTs were coming down and today he really has no symptoms of any problems and I think it would be fine to just check these as needed moving forward. He is eating well. He has an occasional loose stool but is otherwise returned with normal bowel movements.  He also saw Dr. Johna Sheriff about a month before I did his gallbladder surgery for perianal abscess. When I saw him in the hospital  this was causing no trouble. Again he still has really no problems from this except for he had a pins and needles sensation there after retrieval of the [place]. He otherwise is having normal bowel movements without any blood he notes no drainage or any tenderness. Review of Systems     Objective:   Physical Exam Healing lap chole incisions without infection Anus: no evidence of fistula and area is now healed without infection    Assessment:     S/p lap chole S/p i and d of perianal abscess    Plan:     He can return to full activity and is to begin eating anything he wants. He understands that he may have some occasional loose bowel movements after eating certain foods. I also think from his anal exam today that he has no issues from this and we discussed the possibility of developing a fistula still. He is asymptomatic right now I asked him to call back if needed.

## 2011-06-28 ENCOUNTER — Other Ambulatory Visit: Payer: Self-pay | Admitting: *Deleted

## 2011-06-28 MED ORDER — GLIMEPIRIDE 1 MG PO TABS: 1.0000 mg | ORAL_TABLET | Freq: Every day | ORAL | Status: DC

## 2011-07-11 ENCOUNTER — Ambulatory Visit (INDEPENDENT_AMBULATORY_CARE_PROVIDER_SITE_OTHER): Payer: Medicare Other | Admitting: Internal Medicine

## 2011-07-11 ENCOUNTER — Encounter: Payer: Self-pay | Admitting: Internal Medicine

## 2011-07-11 ENCOUNTER — Telehealth: Payer: Self-pay | Admitting: Internal Medicine

## 2011-07-11 VITALS — BP 128/62 | HR 55 | Temp 98.3°F | Wt 219.0 lb

## 2011-07-11 DIAGNOSIS — J209 Acute bronchitis, unspecified: Secondary | ICD-10-CM

## 2011-07-11 MED ORDER — AMOXICILLIN 500 MG PO TABS: 1000.0000 mg | ORAL_TABLET | Freq: Two times a day (BID) | ORAL | Status: AC

## 2011-07-11 MED ORDER — TRAMADOL HCL 50 MG PO TABS: ORAL_TABLET | ORAL | Status: DC

## 2011-07-11 NOTE — Telephone Encounter (Signed)
  Caller: [name] [name]/Patient; PCP: Tillman Abide I.; CB#: [phone];  Call regarding Cough/Congestion SX started 07/07/11 and taking Chertussin and Mucinex.  Afebrile and cough is worse.  Triaged Cough and all emergent SX R/O.  Disp = needs to be seen in 24 hrs as he is couging up green phlegm.  Appt made for today at 1630 with [place].  Call back inst given.

## 2011-07-11 NOTE — Telephone Encounter (Signed)
Will evaluate then 

## 2011-07-11 NOTE — Progress Notes (Signed)
Subjective:    Patient ID: [name] [name], male    DOB: 02-07-1934, 78 y.o.   MRN: [id number]  HPI Recovered from gallbladder surgery  Has had sore throat for about 5 days  Now with nasal congestion and chest Cough with yellow and green mucus No fever No SOB No ear pain  Tried OTC med for cold symptoms---no clear help  Current Outpatient Prescriptions on File Prior to Visit  Medication Sig Dispense Refill  . aspirin 81 MG tablet Take 162 mg by mouth daily.       Marland Kitchen atorvastatin (LIPITOR) 40 MG tablet Take 20 mg by mouth daily.      . Biotin 1000 MCG tablet Take 1,000 mcg by mouth daily.      . carvedilol (COREG) 12.5 MG tablet Take 1 tablet (12.5 mg total) by mouth 2 (two) times daily with a meal.  180 tablet  3  . Cholecalciferol (VITAMIN D3) 2000 UNITS TABS Take 2,000 Units by mouth every morning.       . finasteride (PROSCAR) 5 MG tablet Take 1 tablet (5 mg total) by mouth daily.  90 tablet  3  . Flaxseed, Linseed, (FLAXSEED OIL) 1000 MG CAPS Take 1 capsule by mouth every morning.       Marland Kitchen glimepiride (AMARYL) 1 MG tablet Take 1 tablet (1 mg total) by mouth daily before breakfast.  90 tablet  0  . Glucosamine-Chondroit-Vit C-Mn (GLUCOSAMINE 1500 COMPLEX) CAPS Take 1 capsule by mouth daily.      . insulin detemir (LEVEMIR) 100 UNIT/ML injection Inject 38 Units into the skin at bedtime.       . Multiple Vitamins-Minerals (CENTRUM SILVER PO) Take by mouth.        Marland Kitchen omeprazole (PRILOSEC) 20 MG capsule Take 1 capsule (20 mg total) by mouth 2 (two) times daily.  60 capsule  11  . oxybutynin (DITROPAN) 5 MG tablet Take 5 mg by mouth daily.       . ramipril (ALTACE) 10 MG capsule Take 1 capsule (10 mg total) by mouth daily.  90 capsule  3    No Known Allergies  Past Medical History  Diagnosis Date  . Coronary artery disease   . Hyperlipidemia   . Hypertension   . Type II or unspecified type diabetes mellitus without mention of complication, not stated as uncontrolled 2000  .  Hypertrophy of prostate without urinary obstruction and other lower urinary tract symptoms (LUTS)   . GERD (gastroesophageal reflux disease)   . Perirectal fistula     Past Surgical History  Procedure Date  . Coronary artery bypass graft 2007    x 4  . Foot surgery 2012    right foot  . Lumbar laminectomy 1989  . Coronary angioplasty with stent placement     before CABG  . Joint replacement 8/10    Right THR--[name]  . Colonoscopy w/ polypectomy     3 weeks ago  . Cholecystectomy 05/15/2011    Procedure: LAPAROSCOPIC CHOLECYSTECTOMY;  Surgeon: Emelia Loron, MD;  Location: [facility] OR;  Service: General;  Laterality: N/A;    Family History  Problem Relation Age of Onset  . Cancer Mother   . Heart disease Father     History   Social History  . Marital Status: Divorced    Spouse Name: N/A    Number of Children: 4  . Years of Education: N/A   Occupational History  . [employer] planner   Social  History Main Topics  . Smoking status: Never Smoker   . Smokeless tobacco: Never Used  . Alcohol Use: Yes     occ cocktail  . Drug Use: No  . Sexually Active: Not on file   Other Topics Concern  . Not on file   Social History Narrative   Goes by [employer] living will Has designated [employer] health care POAWould accept resuscitation attemptsWould not want tube feeds if cognitively unaware   Review of Systems No rash No nausea, vomiting or diarrhea Appetite off some     Objective:   Physical Exam  Constitutional: He appears well-developed and well-nourished. No distress.       Coarse cough  HENT:  Mouth/Throat: Oropharynx is clear and moist. No oropharyngeal exudate.       Mild nasal congestion TMs normal  Neck: Normal range of motion. Neck supple.  Pulmonary/Chest: Effort normal and breath sounds normal. No respiratory distress. He has no wheezes. He has no rales.  Lymphadenopathy:    He has no cervical  adenopathy.          Assessment & Plan:

## 2011-07-11 NOTE — Assessment & Plan Note (Signed)
May still be viral though some increased sputum Will try tramadol for cough Amoxicillin Rx to start if worsens in next few days

## 2011-08-12 ENCOUNTER — Other Ambulatory Visit: Payer: Self-pay | Admitting: *Deleted

## 2011-08-12 MED ORDER — INSULIN PEN NEEDLE 31G X 5 MM MISC: Status: DC

## 2011-09-12 ENCOUNTER — Ambulatory Visit: Payer: Medicare Other | Admitting: Internal Medicine

## 2011-09-12 ENCOUNTER — Other Ambulatory Visit: Payer: Self-pay | Admitting: *Deleted

## 2011-09-12 MED ORDER — CARVEDILOL 12.5 MG PO TABS: 12.5000 mg | ORAL_TABLET | Freq: Two times a day (BID) | ORAL | Status: DC

## 2011-09-12 NOTE — Telephone Encounter (Signed)
Refilled Carvedilol. 

## 2011-09-22 ENCOUNTER — Other Ambulatory Visit: Payer: Self-pay | Admitting: Internal Medicine

## 2011-09-22 ENCOUNTER — Encounter: Payer: Medicare Other | Admitting: Internal Medicine

## 2011-11-06 HISTORY — PX: CATARACT EXTRACTION: SUR2

## 2011-11-10 ENCOUNTER — Ambulatory Visit: Payer: Medicare Other | Admitting: Cardiovascular Disease

## 2011-11-14 ENCOUNTER — Ambulatory Visit (INDEPENDENT_AMBULATORY_CARE_PROVIDER_SITE_OTHER): Payer: Medicare Other | Admitting: Cardiovascular Disease

## 2011-11-14 ENCOUNTER — Encounter: Payer: Self-pay | Admitting: Cardiovascular Disease

## 2011-11-14 VITALS — BP 152/68 | HR 59 | Ht 74.0 in | Wt 219.8 lb

## 2011-11-14 DIAGNOSIS — I2510 Atherosclerotic heart disease of native coronary artery without angina pectoris: Secondary | ICD-10-CM

## 2011-11-14 DIAGNOSIS — I10 Essential (primary) hypertension: Secondary | ICD-10-CM

## 2011-11-14 DIAGNOSIS — E785 Hyperlipidemia, unspecified: Secondary | ICD-10-CM

## 2011-11-14 NOTE — Progress Notes (Signed)
Patient ID: [name] [name], male    DOB: [birth year], 78 y.o.   MRN: [id number]  HPI Comments: 78 year old gentleman with a history of coronary artery disease, bypass surgery in November 2006, history of diabetes, hyperlipidemia, hypertension, erectile dysfunction who presents for routine followup.     He reports that he continues to be happy, has no complaints. He denies any chest pain. He rides his bike on a regular basis. He did have foot surgery which has taken a long time to heal.  No edema, no significant shortness of breath, no lightheadedness or dizziness.     Echocardiogram April 2009 shows normal systolic function with mild LVH, diastolic relaxation abnormality, mildly enlarged left atrium, mild aortic insufficiency.    Cardiac CT scan in March 2009 showed severe coronary artery disease with patent grafts x4, saphenous vein graft to the PDA, OM, diagonal and a LIMA graft to the LAD.    Labs from May 2011 shows total cholesterol 138, LDL 69, HDL 32, hemoglobin A1c around 7  Sugar levels have been high as he has been cheating and eating birthday cake    EKG shows normal sinus rhythm with rate 59 beats per minute, no significant ST or T wave changes.    Outpatient Encounter Prescriptions as of 11/14/2011  Medication Sig Dispense Refill  . aspirin 81 MG tablet Take 162 mg by mouth daily.       Marland Kitchen atorvastatin (LIPITOR) 40 MG tablet Take 20 mg by mouth daily.      . Biotin 1000 MCG tablet Take 1,000 mcg by mouth daily.      . carvedilol (COREG) 12.5 MG tablet Take 1 tablet (12.5 mg total) by mouth 2 (two) times daily with a meal.  180 tablet  3  . Cholecalciferol (VITAMIN D3) 2000 UNITS TABS Take 2,000 Units by mouth every morning.       . finasteride (PROSCAR) 5 MG tablet Take 1 tablet (5 mg total) by mouth daily.  90 tablet  3  . Flaxseed, Linseed, (FLAXSEED OIL) 1000 MG CAPS Take 1 capsule by mouth every morning.       Marland Kitchen glimepiride (AMARYL) 1 MG tablet TAKE 1 TABLET EVERY DAY  90  tablet  0  . Glucosamine-Chondroit-Vit C-Mn (GLUCOSAMINE 1500 COMPLEX) CAPS Take 1 capsule by mouth daily.      . insulin detemir (LEVEMIR) 100 UNIT/ML injection Inject 38 Units into the skin at bedtime.       . Insulin Pen Needle (B-D UF III MINI PEN NEEDLES) 31G X 5 MM MISC Use once daily as needed, dx: 250.00  100 each  11  . Multiple Vitamins-Minerals (CENTRUM SILVER PO) Take by mouth.        Marland Kitchen omeprazole (PRILOSEC) 20 MG capsule Take 1 capsule (20 mg total) by mouth 2 (two) times daily.  60 capsule  11  . oxybutynin (DITROPAN) 5 MG tablet Take 5 mg by mouth daily.       . ramipril (ALTACE) 10 MG capsule Take 1 capsule (10 mg total) by mouth daily.  90 capsule  3  . DISCONTD: traMADol (ULTRAM) 50 MG tablet 1 tab by mouth three times daily as needed for severe cough  30 tablet  0    Review of Systems  Constitutional: Negative.   HENT: Negative.   Eyes: Negative.   Cardiovascular: Negative.   Gastrointestinal: Negative.   Musculoskeletal: Negative.   Skin: Negative.   Neurological: Negative.   Hematological: Negative.   Psychiatric/Behavioral: Negative.  All other systems reviewed and are negative.    BP 152/68  Pulse 59  Ht 6\' 2"  (1.88 m)  Wt 219 lb 12 oz (99.678 kg)  BMI 28.21 kg/m2  Physical Exam  Nursing note and vitals reviewed. Constitutional: He is oriented to person, place, and time. He appears well-developed and well-nourished.       Obese  HENT:  Head: Normocephalic.  Nose: Nose normal.  Mouth/Throat: Oropharynx is clear and moist.  Eyes: Conjunctivae are normal. Pupils are equal, round, and reactive to light.  Neck: Normal range of motion. Neck supple. No JVD present.  Cardiovascular: Normal rate, regular rhythm, S1 normal, S2 normal, normal heart sounds and intact distal pulses.  Exam reveals no gallop and no friction rub.   No murmur heard. Pulmonary/Chest: Effort normal and breath sounds normal. No respiratory distress. He has no wheezes. He has no rales.  He exhibits no tenderness.  Abdominal: Soft. Bowel sounds are normal. He exhibits no distension. There is no tenderness.  Musculoskeletal: Normal range of motion. He exhibits no edema and no tenderness.  Lymphadenopathy:    He has no cervical adenopathy.  Neurological: He is alert and oriented to person, place, and time. Coordination normal.  Skin: Skin is warm and dry. No rash noted. No erythema.  Psychiatric: He has a normal mood and affect. His behavior is normal. Judgment and thought content normal.           Assessment and Plan

## 2011-11-14 NOTE — Patient Instructions (Addendum)
You are doing well. No medication changes were made.  Please monitor your blood pressure at home Goal is less than 135 on the top number Call the office if your blood pressures are high. We would increase the ramapril  Please call us if you have new issues that need to be addressed before your next appt.  Your physician wants you to follow-up in: 6 months.  You will receive a reminder letter in the mail two months in advance. If you don't receive a letter, please call our office to schedule the follow-up appointment.

## 2011-11-14 NOTE — Assessment & Plan Note (Signed)
Currently with no symptoms of angina. No further workup at this time. Continue current medication regimen. 

## 2011-11-14 NOTE — Assessment & Plan Note (Signed)
Cholesterol is at goal on the current lipid regimen. No changes to the medications were made.  

## 2011-11-14 NOTE — Assessment & Plan Note (Signed)
Blood pressure is elevated today. We have suggested he monitor his blood pressure at home and call our office for systolic pressure greater than 135 on a regular basis. We would most likely increase his ramapril.

## 2011-11-18 ENCOUNTER — Other Ambulatory Visit: Payer: Self-pay | Admitting: *Deleted

## 2011-11-18 MED ORDER — ATORVASTATIN CALCIUM 40 MG PO TABS: 20.0000 mg | ORAL_TABLET | Freq: Every day | ORAL | Status: DC

## 2011-11-18 NOTE — Telephone Encounter (Signed)
Refilled Atorvastatin.

## 2011-11-22 ENCOUNTER — Ambulatory Visit: Payer: Self-pay | Admitting: Ophthalmology

## 2011-11-22 ENCOUNTER — Ambulatory Visit: Payer: Medicare Other | Admitting: Cardiovascular Disease

## 2012-01-20 ENCOUNTER — Other Ambulatory Visit: Payer: Self-pay | Admitting: Internal Medicine

## 2012-02-09 ENCOUNTER — Encounter: Payer: Self-pay | Admitting: Cardiovascular Disease

## 2012-02-09 ENCOUNTER — Ambulatory Visit (INDEPENDENT_AMBULATORY_CARE_PROVIDER_SITE_OTHER): Payer: Medicare Other | Admitting: Cardiovascular Disease

## 2012-02-09 VITALS — BP 134/64 | HR 64 | Ht 74.0 in | Wt 218.8 lb

## 2012-02-09 DIAGNOSIS — I10 Essential (primary) hypertension: Secondary | ICD-10-CM

## 2012-02-09 DIAGNOSIS — R0602 Shortness of breath: Secondary | ICD-10-CM

## 2012-02-09 DIAGNOSIS — E785 Hyperlipidemia, unspecified: Secondary | ICD-10-CM

## 2012-02-09 DIAGNOSIS — R079 Chest pain, unspecified: Secondary | ICD-10-CM

## 2012-02-09 DIAGNOSIS — E119 Type 2 diabetes mellitus without complications: Secondary | ICD-10-CM

## 2012-02-09 DIAGNOSIS — I2510 Atherosclerotic heart disease of native coronary artery without angina pectoris: Secondary | ICD-10-CM

## 2012-02-09 NOTE — Assessment & Plan Note (Signed)
Cholesterol is at goal on the current lipid regimen. No changes to the medications were made.  

## 2012-02-09 NOTE — Assessment & Plan Note (Signed)
We have encouraged continued exercise, careful diet management in an effort to lose weight. 

## 2012-02-09 NOTE — Assessment & Plan Note (Signed)
Blood pressure is well controlled on today's visit. No changes made to the medications. 

## 2012-02-09 NOTE — Assessment & Plan Note (Signed)
Currently with no symptoms of angina. No further workup at this time. Continue current medication regimen. 

## 2012-02-09 NOTE — Patient Instructions (Addendum)
You are doing well. No medication changes were made.  Please call us if you have new issues that need to be addressed before your next appt.  Your physician wants you to follow-up in: 6 months.  You will receive a reminder letter in the mail two months in advance. If you don't receive a letter, please call our office to schedule the follow-up appointment.   

## 2012-02-09 NOTE — Progress Notes (Signed)
Patient ID: [name] [name], male    DOB: 1934-01-09, 78 y.o.   MRN: [id number]  HPI Comments: 78 year old gentleman with a history of coronary artery disease, bypass surgery in November 2006, history of diabetes, hyperlipidemia, hypertension, erectile dysfunction who presents for routine followup.     He reports that he continues to be happy, has no complaints. He denies any chest pain. He rides his bike periodically. Previous foot surgery. Chronic pain of the ball of his foot, particularly in bare feet. No edema, no significant shortness of breath, no lightheadedness or dizziness.  Weight continues to be a problem. Hemoglobin A1c in the low sevens. His wife is worried about his slow walking pace, mild shortness of breath at baseline, general deconditioning.    Echocardiogram April 2009 shows normal systolic function with mild LVH, diastolic relaxation abnormality, mildly enlarged left atrium, mild aortic insufficiency.    Cardiac CT scan in March 2009 showed severe coronary artery disease with patent grafts x4, saphenous vein graft to the PDA, OM, diagonal and a LIMA graft to the LAD.    Labs from May 2011 shows total cholesterol 138, LDL 69, HDL 32, hemoglobin A1c around 7  Sugar levels have been high as he has been cheating and eating birthday cake    EKG shows normal sinus rhythm with rate 64 beats per minute, no significant ST or T wave changes.    Outpatient Encounter Prescriptions as of 02/09/2012  Medication Sig Dispense Refill  . aspirin 81 MG tablet Take 162 mg by mouth daily.       Marland Kitchen atorvastatin (LIPITOR) 40 MG tablet Take 0.5 tablets (20 mg total) by mouth daily.  90 tablet  5  . Biotin 1000 MCG tablet Take 1,000 mcg by mouth daily.      . carvedilol (COREG) 12.5 MG tablet Take 1 tablet (12.5 mg total) by mouth 2 (two) times daily with a meal.  180 tablet  3  . Cholecalciferol (VITAMIN D3) 2000 UNITS TABS Take 2,000 Units by mouth every morning.       . finasteride (PROSCAR) 5  MG tablet Take 1 tablet (5 mg total) by mouth daily.  90 tablet  3  . Flaxseed, Linseed, (FLAXSEED OIL) 1000 MG CAPS Take 1 capsule by mouth every morning.       Marland Kitchen glimepiride (AMARYL) 1 MG tablet TAKE 1 TABLET EVERY DAY  90 tablet  0  . Glucosamine-Chondroit-Vit C-Mn (GLUCOSAMINE 1500 COMPLEX) CAPS Take 1 capsule by mouth daily.      . insulin detemir (LEVEMIR) 100 UNIT/ML injection Inject 38 Units into the skin at bedtime.       . Insulin Pen Needle (B-D UF III MINI PEN NEEDLES) 31G X 5 MM MISC Use once daily as needed, dx: 250.00  100 each  11  . Multiple Vitamins-Minerals (CENTRUM SILVER PO) Take by mouth.        Marland Kitchen omeprazole (PRILOSEC) 20 MG capsule TAKE ONE CAPSULE TWICE A DAY  60 capsule  11  . oxybutynin (DITROPAN) 5 MG tablet Take 5 mg by mouth daily.       . ramipril (ALTACE) 10 MG capsule Take 1 capsule (10 mg total) by mouth daily.  90 capsule  3    Review of Systems  Constitutional: Negative.   HENT: Negative.   Eyes: Negative.   Cardiovascular: Negative.   Gastrointestinal: Negative.   Musculoskeletal: Negative.        Pain in the ball of his foot  Skin: Negative.  Neurological: Negative.   Hematological: Negative.   Psychiatric/Behavioral: Negative.   All other systems reviewed and are negative.    BP 134/64  Pulse 64  Ht 6\' 2"  (1.88 m)  Wt 218 lb 12 oz (99.224 kg)  BMI 28.09 kg/m2  Physical Exam  Nursing note and vitals reviewed. Constitutional: He is oriented to person, place, and time. He appears well-developed and well-nourished.       Obese  HENT:  Head: Normocephalic.  Nose: Nose normal.  Mouth/Throat: Oropharynx is clear and moist.  Eyes: Conjunctivae normal are normal. Pupils are equal, round, and reactive to light.  Neck: Normal range of motion. Neck supple. No JVD present.  Cardiovascular: Normal rate, regular rhythm, S1 normal, S2 normal, normal heart sounds and intact distal pulses.  Exam reveals no gallop and no friction rub.   No murmur  heard. Pulmonary/Chest: Effort normal and breath sounds normal. No respiratory distress. He has no wheezes. He has no rales. He exhibits no tenderness.  Abdominal: Soft. Bowel sounds are normal. He exhibits no distension. There is no tenderness.  Musculoskeletal: Normal range of motion. He exhibits no edema and no tenderness.  Lymphadenopathy:    He has no cervical adenopathy.  Neurological: He is alert and oriented to person, place, and time. Coordination normal.  Skin: Skin is warm and dry. No rash noted. No erythema.  Psychiatric: He has a normal mood and affect. His behavior is normal. Judgment and thought content normal.           Assessment and Plan        he

## 2012-03-26 ENCOUNTER — Encounter: Payer: Medicare Other | Admitting: Internal Medicine

## 2012-04-16 ENCOUNTER — Other Ambulatory Visit: Payer: Self-pay | Admitting: *Deleted

## 2012-04-16 MED ORDER — OXYBUTYNIN CHLORIDE 5 MG PO TABS: 5.0000 mg | ORAL_TABLET | Freq: Every day | ORAL | Status: DC

## 2012-04-16 MED ORDER — FINASTERIDE 5 MG PO TABS: 5.0000 mg | ORAL_TABLET | Freq: Every day | ORAL | Status: DC

## 2012-04-16 NOTE — Addendum Note (Signed)
Addended by: Baldomero Lamy on: 04/16/2012 01:42 PM   Modules accepted: Orders

## 2012-05-22 ENCOUNTER — Telehealth: Payer: Self-pay

## 2012-05-22 NOTE — Telephone Encounter (Signed)
Pt has extensive CAD hx; hx CABG, etc Says he has noticed some episodes of angina over the last few days He usually walks at [facility] and has no issues of angina/SOB but recently is unable to complete his walk d/t DOE Denies CP or SOB today, says these symptoms are intermittent The only symptoms he had prior to CABG in 2005 was dyspnea, which he is having lately He was seen by Dr,. Gollan in December and was feeling great at that time Pt voices concern about these new symptoms He does not have NTG to take PRN I worked him in with Dr. Mariah Milling tomm and advised him to go to ER should symptoms return/get worse Understanding verb

## 2012-05-22 NOTE — Telephone Encounter (Signed)
Pt states he is having angina and shortness of breath.

## 2012-05-23 ENCOUNTER — Ambulatory Visit (INDEPENDENT_AMBULATORY_CARE_PROVIDER_SITE_OTHER): Payer: Medicare Other | Admitting: Cardiovascular Disease

## 2012-05-23 ENCOUNTER — Encounter: Payer: Self-pay | Admitting: Cardiovascular Disease

## 2012-05-23 VITALS — BP 150/60 | HR 54 | Ht 74.0 in | Wt 219.5 lb

## 2012-05-23 DIAGNOSIS — E119 Type 2 diabetes mellitus without complications: Secondary | ICD-10-CM

## 2012-05-23 DIAGNOSIS — E785 Hyperlipidemia, unspecified: Secondary | ICD-10-CM

## 2012-05-23 DIAGNOSIS — I10 Essential (primary) hypertension: Secondary | ICD-10-CM

## 2012-05-23 DIAGNOSIS — R079 Chest pain, unspecified: Secondary | ICD-10-CM

## 2012-05-23 DIAGNOSIS — R0602 Shortness of breath: Secondary | ICD-10-CM

## 2012-05-23 DIAGNOSIS — I2510 Atherosclerotic heart disease of native coronary artery without angina pectoris: Secondary | ICD-10-CM

## 2012-05-23 NOTE — Patient Instructions (Addendum)
You are doing well. No medication changes were made.  We will schedule you for a myoview stress test Hold the coreg the evening before and morning of the test  Please call us if you have new issues that need to be addressed before your next appt.  Your physician wants you to follow-up in: 6 months.  You will receive a reminder letter in the mail two months in advance. If you don't receive a letter, please call our office to schedule the follow-up appointment.

## 2012-05-23 NOTE — Assessment & Plan Note (Signed)
Worsening shortness of breath and chest pain. Unable to treadmill secondary to chronic foot pain. We will order a pharmacologic Myoview to rule out ischemia.

## 2012-05-23 NOTE — Assessment & Plan Note (Signed)
Blood pressure is well controlled on today's visit. No changes made to the medications. 

## 2012-05-23 NOTE — Progress Notes (Signed)
Patient ID: [name] [name], male    DOB: 23-Jan-1934, 79 y.o.   MRN: [id number]  HPI Comments: 79 year old gentleman with a history of coronary artery disease, bypass surgery in November 2006, history of diabetes, hyperlipidemia, hypertension, erectile dysfunction who presents for routine followup.     He reports having occasional episodes of chest pain on the left, worsening shortness of breath. His wife agrees. He is able to bike short distances. Unable to walk as far as he should be able to.   Previous foot surgery. Chronic pain of the ball of his foot, particularly in bare feet. No edema,  no lightheadedness or dizziness.  Weight continues to be a problem. Hemoglobin A1c in the low sevens. His wife is worried about his slow walking pace, shortness of breath at baseline, general deconditioning, also recent episode of chest pain    Echocardiogram April 2009 shows normal systolic function with mild LVH, diastolic relaxation abnormality, mildly enlarged left atrium, mild aortic insufficiency.    Cardiac CT scan in March 2009 showed severe coronary artery disease with patent grafts x4, saphenous vein graft to the PDA, OM, diagonal and a LIMA graft to the LAD.    Labs from May 2011 shows total cholesterol 138, LDL 69, HDL 32, hemoglobin A1c around 7    EKG shows normal sinus rhythm with rate 54 beats per minute, no significant ST or T wave changes.    Outpatient Encounter Prescriptions as of 05/23/2012  Medication Sig Dispense Refill  . aspirin 81 MG tablet Take 162 mg by mouth daily.       Marland Kitchen atorvastatin (LIPITOR) 40 MG tablet Take 0.5 tablets (20 mg total) by mouth daily.  90 tablet  5  . Biotin 1000 MCG tablet Take 1,000 mcg by mouth daily.      . carvedilol (COREG) 12.5 MG tablet Take 1 tablet (12.5 mg total) by mouth 2 (two) times daily with a meal.  180 tablet  3  . Cholecalciferol (VITAMIN D3) 2000 UNITS TABS Take 2,000 Units by mouth every morning.       . finasteride (PROSCAR) 5 MG  tablet Take 1 tablet (5 mg total) by mouth daily.  90 tablet  1  . Flaxseed, Linseed, (FLAXSEED OIL) 1000 MG CAPS Take 1 capsule by mouth every morning.       Marland Kitchen glimepiride (AMARYL) 1 MG tablet TAKE 1 TABLET EVERY DAY  90 tablet  0  . Glucosamine-Chondroit-Vit C-Mn (GLUCOSAMINE 1500 COMPLEX) CAPS Take 1 capsule by mouth daily.      . insulin detemir (LEVEMIR) 100 UNIT/ML injection Inject 40 Units into the skin at bedtime.       . Insulin Pen Needle (B-D UF III MINI PEN NEEDLES) 31G X 5 MM MISC Use once daily as needed, dx: 250.00  100 each  11  . Multiple Vitamins-Minerals (CENTRUM SILVER PO) Take by mouth.        Marland Kitchen omeprazole (PRILOSEC) 20 MG capsule       . oxybutynin (DITROPAN) 5 MG tablet Take 1 tablet (5 mg total) by mouth daily.  30 tablet  3  . ramipril (ALTACE) 10 MG capsule Take 1 capsule (10 mg total) by mouth daily.  90 capsule  3  . vitamin E 400 UNIT capsule Take 400 Units by mouth daily.      . [DISCONTINUED] omeprazole (PRILOSEC) 20 MG capsule TAKE ONE CAPSULE TWICE A DAY  60 capsule  11   No facility-administered encounter medications on file as of  05/23/2012.    Review of Systems  Constitutional: Negative.   HENT: Negative.   Eyes: Negative.   Cardiovascular: Negative.   Gastrointestinal: Negative.   Musculoskeletal: Negative.        Pain in the ball of his foot  Skin: Negative.   Neurological: Negative.   Psychiatric/Behavioral: Negative.   All other systems reviewed and are negative.    BP 150/60  Pulse 54  Ht 6\' 2"  (1.88 m)  Wt 219 lb 8 oz (99.565 kg)  BMI 28.17 kg/m2  Physical Exam  Nursing note and vitals reviewed. Constitutional: He is oriented to person, place, and time. He appears well-developed and well-nourished.  Obese  HENT:  Head: Normocephalic.  Nose: Nose normal.  Mouth/Throat: Oropharynx is clear and moist.  Eyes: Conjunctivae are normal. Pupils are equal, round, and reactive to light.  Neck: Normal range of motion. Neck supple. No JVD  present.  Cardiovascular: Normal rate, regular rhythm, S1 normal, S2 normal, normal heart sounds and intact distal pulses.  Exam reveals no gallop and no friction rub.   No murmur heard. Pulmonary/Chest: Effort normal and breath sounds normal. No respiratory distress. He has no wheezes. He has no rales. He exhibits no tenderness.  Abdominal: Soft. Bowel sounds are normal. He exhibits no distension. There is no tenderness.  Musculoskeletal: Normal range of motion. He exhibits no edema and no tenderness.  Lymphadenopathy:    He has no cervical adenopathy.  Neurological: He is alert and oriented to person, place, and time. Coordination normal.  Skin: Skin is warm and dry. No rash noted. No erythema.  Psychiatric: He has a normal mood and affect. His behavior is normal. Judgment and thought content normal.      Assessment and Plan        he

## 2012-05-23 NOTE — Assessment & Plan Note (Signed)
Cholesterol is at goal on the current lipid regimen. No changes to the medications were made.  

## 2012-05-23 NOTE — Assessment & Plan Note (Signed)
We have encouraged continued exercise, careful diet management in an effort to lose weight. 

## 2012-05-23 NOTE — Assessment & Plan Note (Signed)
If his stress test is negative for ischemia, shortness of breath could be secondary to general deconditioning and obesity. Echocardiogram could be ordered to rule out pulmonary hypertension

## 2012-05-25 ENCOUNTER — Ambulatory Visit (INDEPENDENT_AMBULATORY_CARE_PROVIDER_SITE_OTHER): Payer: Medicare Other | Admitting: Internal Medicine

## 2012-05-25 ENCOUNTER — Encounter: Payer: Self-pay | Admitting: Internal Medicine

## 2012-05-25 VITALS — BP 140/60 | HR 68 | Wt 219.0 lb

## 2012-05-25 DIAGNOSIS — R1012 Left upper quadrant pain: Secondary | ICD-10-CM

## 2012-05-25 DIAGNOSIS — E119 Type 2 diabetes mellitus without complications: Secondary | ICD-10-CM

## 2012-05-25 DIAGNOSIS — K219 Gastro-esophageal reflux disease without esophagitis: Secondary | ICD-10-CM

## 2012-05-25 LAB — HEPATIC FUNCTION PANEL
ALT: 15 U/L (ref 0–53)
AST: 19 U/L (ref 0–37)
Bilirubin, Direct: 0.2 mg/dL (ref 0.0–0.3)
Indirect Bilirubin: 0.8 mg/dL (ref 0.0–0.9)
Total Protein: 6 g/dL (ref 6.0–8.3)

## 2012-05-25 LAB — CBC WITH DIFFERENTIAL/PLATELET
Basophils Relative: 0 % (ref 0–1)
Eosinophils Relative: 3 % (ref 0–5)
HCT: 41.7 % (ref 39.0–52.0)
Hemoglobin: 14.6 g/dL (ref 13.0–17.0)
Lymphocytes Relative: 17 % (ref 12–46)
MCHC: 35 g/dL (ref 30.0–36.0)
MCV: 92.1 fL (ref 78.0–100.0)
Monocytes Absolute: 0.7 10*3/uL (ref 0.1–1.0)
Monocytes Relative: 7 % (ref 3–12)
Neutro Abs: 6.8 10*3/uL (ref 1.7–7.7)

## 2012-05-25 LAB — BASIC METABOLIC PANEL
CO2: 23 mEq/L (ref 19–32)
Calcium: 8.4 mg/dL (ref 8.4–10.5)
Glucose, Bld: 270 mg/dL — ABNORMAL HIGH (ref 70–99)
Potassium: 4.2 mEq/L (ref 3.5–5.3)
Sodium: 137 mEq/L (ref 135–145)

## 2012-05-25 LAB — LIPASE: Lipase: <10 U/L (ref 0–75)

## 2012-05-25 NOTE — Assessment & Plan Note (Signed)
Some generalization of the pain but no worrisome findings May have increased distention but doesn't have PE findings that point to ascites (but can't be sure) No masses  Will check labs and ultrasound for now

## 2012-05-25 NOTE — Assessment & Plan Note (Signed)
Still uses the omeprazole and this controls his symptoms

## 2012-05-25 NOTE — Patient Instructions (Signed)
Please try stopping all your vitamins and supplements for a while

## 2012-05-25 NOTE — Progress Notes (Signed)
Subjective:    Patient ID: [name] [name], male    DOB: [birth year], 79 y.o.   MRN: [id number]  HPI Here with wife  Still feels sore in his abdomen if he presses on it Goes back several months Appetite is fine Rare nausea but no vomting. May be postprandial Bowels have been normal No blood in stool or black stool Doesn't feel his abdomen is more distended---or perhaps just a little  Uses occasional tylenol but no NSAID  Chews regular gum occ--not daily Uses splenda daily for coffee and tea  Voids okay Nocturia x 1-2 usually Feels the ditropan is helping Occ trouble starting his stream  Current Outpatient Prescriptions on File Prior to Visit  Medication Sig Dispense Refill  . aspirin 81 MG tablet Take 162 mg by mouth daily.       Marland Kitchen atorvastatin (LIPITOR) 40 MG tablet Take 0.5 tablets (20 mg total) by mouth daily.  90 tablet  5  . Biotin 1000 MCG tablet Take 1,000 mcg by mouth daily.      . carvedilol (COREG) 12.5 MG tablet Take 1 tablet (12.5 mg total) by mouth 2 (two) times daily with a meal.  180 tablet  3  . Cholecalciferol (VITAMIN D3) 2000 UNITS TABS Take 2,000 Units by mouth every morning.       . finasteride (PROSCAR) 5 MG tablet Take 1 tablet (5 mg total) by mouth daily.  90 tablet  1  . Flaxseed, Linseed, (FLAXSEED OIL) 1000 MG CAPS Take 1 capsule by mouth every morning.       Marland Kitchen glimepiride (AMARYL) 1 MG tablet TAKE 1 TABLET EVERY DAY  90 tablet  0  . Glucosamine-Chondroit-Vit C-Mn (GLUCOSAMINE 1500 COMPLEX) CAPS Take 1 capsule by mouth daily.      . insulin detemir (LEVEMIR) 100 UNIT/ML injection Inject 40 Units into the skin at bedtime.       . Insulin Pen Needle (B-D UF III MINI PEN NEEDLES) 31G X 5 MM MISC Use once daily as needed, dx: 250.00  100 each  11  . Multiple Vitamins-Minerals (CENTRUM SILVER PO) Take by mouth.        Marland Kitchen omeprazole (PRILOSEC) 20 MG capsule Take 20 mg by mouth daily.       Marland Kitchen oxybutynin (DITROPAN) 5 MG tablet Take 1 tablet (5 mg total) by  mouth daily.  30 tablet  3  . ramipril (ALTACE) 10 MG capsule Take 1 capsule (10 mg total) by mouth daily.  90 capsule  3  . vitamin E 400 UNIT capsule Take 400 Units by mouth daily.      . [DISCONTINUED] oxybutynin (DITROPAN) 5 MG tablet Take 5 mg by mouth daily.        No current facility-administered medications on file prior to visit.    No Known Allergies  Past Medical History  Diagnosis Date  . Coronary artery disease   . Hyperlipidemia   . Hypertension   . Type II or unspecified type diabetes mellitus without mention of complication, not stated as uncontrolled 2000  . Hypertrophy of prostate without urinary obstruction and other lower urinary tract symptoms (LUTS)   . GERD (gastroesophageal reflux disease)   . Perirectal fistula   . Cataract     Past Surgical History  Procedure Laterality Date  . Coronary artery bypass graft  2007    x 4  . Foot surgery  2012    right foot  . Lumbar laminectomy  1989  . Coronary angioplasty with stent  placement      before CABG  . Joint replacement  8/10    Right THR--[name]  . Colonoscopy w/ polypectomy      3 weeks ago  . Cholecystectomy  05/15/2011    Procedure: LAPAROSCOPIC CHOLECYSTECTOMY;  Surgeon: Emelia Loron, MD;  Location: [facility] OR;  Service: General;  Laterality: N/A;  . Cataract extraction  sept 2013    right    Family History  Problem Relation Age of Onset  . Cancer Mother   . Heart disease Father     History   Social History  . Marital Status: Divorced    Spouse Name: N/A    Number of Children: 4  . Years of Education: N/A   Occupational History  . [employer]   Social History Main Topics  . Smoking status: Never Smoker   . Smokeless tobacco: Never Used  . Alcohol Use: Yes     Comment: occ cocktail  . Drug Use: No  . Sexually Active: Not on file   Other Topics Concern  . Not on file   Social History Narrative   Goes by [name]   Has living will     Has designated [employer] health care POA   Would accept resuscitation attempts   Would not want tube feeds if cognitively unaware   Review of Systems On his supplements but not clear they are helping Ongoing hand, knee and hip pain Rides a bike for his exercise--but had to cut back due to groin rash    Objective:   Physical Exam  Constitutional: He appears well-developed and well-nourished. No distress.  Neck: Normal range of motion. Neck supple.  Pulmonary/Chest: Effort normal and breath sounds normal. No respiratory distress. He has no wheezes. He has no rales.  Abdominal: Soft. Bowel sounds are normal. He exhibits distension. He exhibits no mass. There is tenderness. There is no rebound and no guarding.  Has some left side tenderness and some in mid abdomen  Lymphadenopathy:    He has no cervical adenopathy.          Assessment & Plan:

## 2012-05-25 NOTE — Assessment & Plan Note (Signed)
Sees endocrinologist Last A1c 7.1%

## 2012-05-29 ENCOUNTER — Encounter: Payer: Self-pay | Admitting: Cardiovascular Disease

## 2012-05-29 ENCOUNTER — Ambulatory Visit: Payer: Self-pay | Admitting: Cardiovascular Disease

## 2012-05-30 ENCOUNTER — Encounter: Payer: Self-pay | Admitting: Internal Medicine

## 2012-05-30 ENCOUNTER — Ambulatory Visit: Payer: Self-pay | Admitting: Internal Medicine

## 2012-05-30 DIAGNOSIS — R079 Chest pain, unspecified: Secondary | ICD-10-CM

## 2012-06-04 ENCOUNTER — Telehealth: Payer: Self-pay

## 2012-06-04 NOTE — Telephone Encounter (Signed)
Pt informed

## 2012-06-04 NOTE — Telephone Encounter (Signed)
Pt would like to know results of stress test from last week.

## 2012-06-13 ENCOUNTER — Encounter: Payer: Self-pay | Admitting: Internal Medicine

## 2012-06-13 ENCOUNTER — Encounter: Payer: Self-pay | Admitting: Gastroenterology

## 2012-06-13 ENCOUNTER — Ambulatory Visit (INDEPENDENT_AMBULATORY_CARE_PROVIDER_SITE_OTHER): Payer: Medicare Other | Admitting: Internal Medicine

## 2012-06-13 VITALS — BP 150/60 | HR 56 | Temp 97.2°F | Ht 74.0 in | Wt 219.0 lb

## 2012-06-13 DIAGNOSIS — E785 Hyperlipidemia, unspecified: Secondary | ICD-10-CM

## 2012-06-13 DIAGNOSIS — N400 Enlarged prostate without lower urinary tract symptoms: Secondary | ICD-10-CM

## 2012-06-13 DIAGNOSIS — E119 Type 2 diabetes mellitus without complications: Secondary | ICD-10-CM

## 2012-06-13 DIAGNOSIS — Z23 Encounter for immunization: Secondary | ICD-10-CM

## 2012-06-13 DIAGNOSIS — Z0000 Encounter for general adult medical examination without abnormal findings: Secondary | ICD-10-CM

## 2012-06-13 DIAGNOSIS — R1012 Left upper quadrant pain: Secondary | ICD-10-CM

## 2012-06-13 DIAGNOSIS — M159 Polyosteoarthritis, unspecified: Secondary | ICD-10-CM

## 2012-06-13 DIAGNOSIS — I2510 Atherosclerotic heart disease of native coronary artery without angina pectoris: Secondary | ICD-10-CM

## 2012-06-13 LAB — LIPID PANEL
HDL: 31.7 mg/dL — ABNORMAL LOW (ref >39.00)
Total CHOL/HDL Ratio: 4
VLDL: 27.2 mg/dL (ref 0.0–40.0)

## 2012-06-13 LAB — HEMOGLOBIN A1C: Hgb A1c MFr Bld: 7.5 % — ABNORMAL HIGH (ref 4.6–6.5)

## 2012-06-13 NOTE — Assessment & Plan Note (Signed)
Also epigastric Vague and persistent Will refer to GI Hepatosplenomegaly on ultrasound but no CHF or blood count abnormalities to give explanation

## 2012-06-13 NOTE — Assessment & Plan Note (Signed)
Sees Dr Ladona Mow Will check A1c and send to him

## 2012-06-13 NOTE — Assessment & Plan Note (Signed)
Will add regular tylenol

## 2012-06-13 NOTE — Patient Instructions (Signed)
Please try support socks when you are in the car for a prolonged time  DASH Diet The DASH diet stands for "Dietary Approaches to Stop Hypertension." It is a healthy eating plan that has been shown to reduce high blood pressure (hypertension) in as little as 14 days, while also possibly providing other significant health benefits. These other health benefits include reducing the risk of breast cancer after menopause and reducing the risk of type 2 diabetes, heart disease, colon cancer, and stroke. Health benefits also include weight loss and slowing kidney failure in patients with chronic kidney disease.  DIET GUIDELINES  Limit salt (sodium). Your diet should contain less than 1500 mg of sodium daily.  Limit refined or processed carbohydrates. Your diet should include mostly whole grains. Desserts and added sugars should be used sparingly.  Include small amounts of heart-healthy fats. These types of fats include nuts, oils, and tub margarine. Limit saturated and trans fats. These fats have been shown to be harmful in the body. CHOOSING FOODS  The following food groups are based on a 2000 calorie diet. See your Registered Dietitian for individual calorie needs. Grains and Grain Products (6 to 8 servings daily)  Eat More Often: Whole-wheat bread, brown rice, whole-grain or wheat pasta, quinoa, popcorn without added fat or salt (air popped).  Eat Less Often: White bread, white pasta, white rice, cornbread. Vegetables (4 to 5 servings daily)  Eat More Often: Fresh, frozen, and canned vegetables. Vegetables may be raw, steamed, roasted, or grilled with a minimal amount of fat.  Eat Less Often/Avoid: Creamed or fried vegetables. Vegetables in a cheese sauce. Fruit (4 to 5 servings daily)  Eat More Often: All fresh, canned (in natural juice), or frozen fruits. Dried fruits without added sugar. One hundred percent fruit juice ( cup [237 mL] daily).  Eat Less Often: Dried fruits with added sugar.  Canned fruit in light or heavy syrup. [employer], Fish, and Poultry (2 servings or less daily. One serving is 3 to 4 oz [85-114 g]).  Eat More Often: Ninety percent or leaner ground beef, tenderloin, sirloin. Round cuts of beef, chicken breast, [place] breast. All fish. Grill, bake, or broil your meat. Nothing should be fried.  Eat Less Often/Avoid: Fatty cuts of meat, [place], or chicken leg, thigh, or wing. Fried cuts of meat or fish. Dairy (2 to 3 servings)  Eat More Often: Low-fat or fat-free milk, low-fat plain or light yogurt, reduced-fat or part-skim cheese.  Eat Less Often/Avoid: Milk (whole, 2%).Whole milk yogurt. Full-fat cheeses. Nuts, Seeds, and Legumes (4 to 5 servings per week)  Eat More Often: All without added salt.  Eat Less Often/Avoid: Salted nuts and seeds, canned beans with added salt. Fats and Sweets (limited)  Eat More Often: Vegetable oils, tub margarines without trans fats, sugar-free gelatin. Mayonnaise and salad dressings.  Eat Less Often/Avoid: Coconut oils, palm oils, butter, stick margarine, cream, half and half, cookies, candy, pie. FOR MORE INFORMATION The Dash Diet Eating Plan: www.dashdiet.org Document Released: 02/10/2011 Document Revised: 05/16/2011 Document Reviewed: 02/10/2011 [facility] Patient Information 2013 [place], [place].

## 2012-06-13 NOTE — Progress Notes (Signed)
Subjective:    Patient ID: [name], male    DOB: 03-27-1933, 79 y.o.   MRN: [id number]  HPI Still has occasional soreness in abdomen Points epigastric Appetite is fine  Sees Dr [name] at [name] for diabetes Last A1c late in 2013 Checks sugars daily-- 140-180 fasting Last eye exam   Having hand arthritis pain Glucosamine not much help Discussed adding tylenol  Just had treatment of actinics yesterday Sees derm yearly Current Outpatient Prescriptions on File Prior to Visit  Medication Sig Dispense Refill  . aspirin 81 MG tablet Take 162 mg by mouth daily.       Marland Kitchen atorvastatin (LIPITOR) 40 MG tablet Take 0.5 tablets (20 mg total) by mouth daily.  90 tablet  5  . Biotin 1000 MCG tablet Take 1,000 mcg by mouth daily.      . carvedilol (COREG) 12.5 MG tablet Take 1 tablet (12.5 mg total) by mouth 2 (two) times daily with a meal.  180 tablet  3  . Cholecalciferol (VITAMIN D3) 2000 UNITS TABS Take 2,000 Units by mouth every morning.       . finasteride (PROSCAR) 5 MG tablet Take 1 tablet (5 mg total) by mouth daily.  90 tablet  1  . Flaxseed, Linseed, (FLAXSEED OIL) 1000 MG CAPS Take 1 capsule by mouth every morning.       Marland Kitchen glimepiride (AMARYL) 1 MG tablet TAKE 1 TABLET EVERY DAY  90 tablet  0  . Glucosamine-Chondroit-Vit C-Mn (GLUCOSAMINE 1500 COMPLEX) CAPS Take 1 capsule by mouth daily.      . insulin detemir (LEVEMIR) 100 UNIT/ML injection Inject 40 Units into the skin at bedtime.       . Insulin Pen Needle (B-D UF III MINI PEN NEEDLES) 31G X 5 MM MISC Use once daily as needed, dx: 250.00  100 each  11  . Multiple Vitamins-Minerals (CENTRUM SILVER PO) Take by mouth.        Marland Kitchen omeprazole (PRILOSEC) 20 MG capsule Take 20 mg by mouth daily.       Marland Kitchen oxybutynin (DITROPAN) 5 MG tablet Take 1 tablet (5 mg total) by mouth daily.  30 tablet  3  . ramipril (ALTACE) 10 MG capsule Take 1 capsule (10 mg total) by mouth daily.  90 capsule  3  . vitamin E 400 UNIT capsule Take 400 Units by  mouth daily.      . [DISCONTINUED] oxybutynin (DITROPAN) 5 MG tablet Take 5 mg by mouth daily.        No current facility-administered medications on file prior to visit.    No Known Allergies  Past Medical History  Diagnosis Date  . Coronary artery disease   . Hyperlipidemia   . Hypertension   . Type II or unspecified type diabetes mellitus without mention of complication, not stated as uncontrolled 2000  . Hypertrophy of prostate without urinary obstruction and other lower urinary tract symptoms (LUTS)   . GERD (gastroesophageal reflux disease)   . Perirectal fistula   . Cataract     Past Surgical History  Procedure Laterality Date  . Coronary artery bypass graft  2007    x 4  . Foot surgery  2012    right foot  . Lumbar laminectomy  1989  . Coronary angioplasty with stent placement      before CABG  . Joint replacement  8/10    Right THR--[name]  . Colonoscopy w/ polypectomy      3 weeks ago  . Cholecystectomy  05/15/2011    Procedure: LAPAROSCOPIC CHOLECYSTECTOMY;  Surgeon: [name] Loron, MD;  Location: [name] OR;  Service: General;  Laterality: N/A;  . Cataract extraction  sept 2013    right    Family History  Problem Relation Age of Onset  . Cancer Mother   . Heart disease Father     History   Social History  . Marital Status: Divorced    Spouse Name: N/A    Number of Children: 4  . Years of Education: N/A   Occupational History  . [employer] pl[employer]   Social History Main Topics  . Smoking status: Never Smoker   . Smokeless tobacco: Never Used  . Alcohol Use: Yes     Comment: occ cocktail  . Drug Use: No  . Sexually Active: Not on file   Other Topics Concern  . Not on file   Social History Narrative   [name]   Has living will    Has designated [name] health care POA   Would accept resuscitation attempts   Would not want tube feeds if cognitively unaware   Review of  Systems  Constitutional: Negative for fatigue and unexpected weight change.       Tries to ride bike regularly Not strict diabetic diet--not the right things often Wears seat belt  HENT: Negative for hearing loss, congestion, rhinorrhea, dental problem and tinnitus.        Full upper, partial lower  Eyes: Negative for visual disturbance.       Cataract surgery last year on right  Cardiovascular: Positive for leg swelling. Negative for chest pain and palpitations.       Mild edema in in car a lot--- discussed support socks  Gastrointestinal: Positive for abdominal pain. Negative for nausea, vomiting, constipation and blood in stool.       Heartburn controlled on med  Endocrine: Negative for cold intolerance and heat intolerance.  Genitourinary: Negative for difficulty urinating.       Voids okay on finasteride Nocturia x 1  Less daytime urgency Does have ED--doesn't want meds  Musculoskeletal: Positive for back pain and arthralgias. Negative for joint swelling.       Hand arthritis Still has problems with back from past laminectomy  Skin: Negative for rash.       Recent Rx of actinics  Allergic/Immunologic: Negative for environmental allergies and immunocompromised state.  Neurological: Negative for dizziness, syncope, weakness, light-headedness, numbness and headaches.       Intermittent vertigo--has used dramamine or meclizine Left leg stays numb since back surgery  Hematological: Negative for adenopathy. Bruises/bleeds easily.  Psychiatric/Behavioral: Negative for sleep disturbance and dysphoric mood. The patient is not nervous/anxious.        Objective:   Physical Exam  Constitutional: He is oriented to person, place, and time. He appears well-developed and well-nourished. No distress.  HENT:  Head: Normocephalic and atraumatic.  Right Ear: External ear normal.  Left Ear: External ear normal.  Mouth/Throat: Oropharynx is clear and moist. No oropharyngeal exudate.  Eyes:  Conjunctivae and EOM are normal. Pupils are equal, round, and reactive to light.  Neck: Normal range of motion. Neck supple. No thyromegaly present.  Cardiovascular: Normal rate, regular rhythm, normal heart sounds and intact distal pulses.  Exam reveals no gallop.   No murmur heard. Normal pulse on right, diminished in left foot  Pulmonary/Chest: Effort normal and breath sounds normal. No respiratory distress. He has no wheezes. He has no  rales.  Abdominal: Soft. He exhibits no distension. There is no tenderness. There is no rebound and no guarding.  Liver palpable at [facility]  Musculoskeletal: He exhibits no edema and no tenderness.  Thickening in hands---DIP/PIP  Lymphadenopathy:    He has no cervical adenopathy.  Neurological: He is alert and oriented to person, place, and time.  Skin: No rash noted. There is erythema.  Redness at left temple where actinics treated  Psychiatric: He has a normal mood and affect. His behavior is normal.          Assessment & Plan:

## 2012-06-13 NOTE — Assessment & Plan Note (Signed)
Seems to be quiet No changes 

## 2012-06-13 NOTE — Addendum Note (Signed)
Addended by: Chandra Batch E on: 06/13/2012 12:43 PM   Modules accepted: Orders

## 2012-06-13 NOTE — Assessment & Plan Note (Signed)
Satisfied with the finasteride 

## 2012-06-13 NOTE — Assessment & Plan Note (Signed)
Discussed fitness and working towards weight loss Not really compliant with diabetic diet No PSA due to age

## 2012-06-25 ENCOUNTER — Other Ambulatory Visit: Payer: Self-pay | Admitting: *Deleted

## 2012-06-25 MED ORDER — RAMIPRIL 10 MG PO CAPS: 10.0000 mg | ORAL_CAPSULE | Freq: Every day | ORAL | Status: DC

## 2012-06-25 NOTE — Telephone Encounter (Signed)
Refilled Altace sent to [facility].

## 2012-06-29 ENCOUNTER — Encounter: Payer: Self-pay | Admitting: Internal Medicine

## 2012-07-03 ENCOUNTER — Encounter: Payer: Self-pay | Admitting: Gastroenterology

## 2012-07-03 ENCOUNTER — Ambulatory Visit (INDEPENDENT_AMBULATORY_CARE_PROVIDER_SITE_OTHER): Payer: Medicare Other | Admitting: Gastroenterology

## 2012-07-03 VITALS — BP 140/78 | HR 89 | Ht 74.0 in | Wt 222.0 lb

## 2012-07-03 DIAGNOSIS — R1084 Generalized abdominal pain: Secondary | ICD-10-CM

## 2012-07-03 DIAGNOSIS — R933 Abnormal findings on diagnostic imaging of other parts of digestive tract: Secondary | ICD-10-CM

## 2012-07-03 DIAGNOSIS — Z1211 Encounter for screening for malignant neoplasm of colon: Secondary | ICD-10-CM

## 2012-07-03 NOTE — Progress Notes (Signed)
History of Present Illness: This is a 79 year old male who is accompanied by his wife. He relates several month history of generalized abdominal tenderness. He only notes his symptoms when he pressure is applied to his abdomen. He has chronic GERD that is well controlled on omeprazole. He recently underwent an abdominal ultrasound which showed mild hepatosplenomegaly and a renal cysts. CT scan one year ago also showed splenomegaly. Recent blood work was unremarkable except for hemoglobin A1c of 7.5. Colonoscopy records requested and received. He underwent colonoscopy in January 2004 by Dr. Lemar Livings and it was normal. Denies weight loss, constipation, diarrhea, change in stool caliber, melena, hematochezia, nausea, vomiting, dysphagia, reflux symptoms, chest pain.  Review of Systems: Pertinent positive and negative review of systems were noted in the above HPI section. All other review of systems were otherwise negative.  Current Medications, Allergies, Past Medical History, Past Surgical History, Family History and Social History were reviewed in [employer] record.  Physical Exam: General: Well developed , well nourished, no acute distress Head: Normocephalic and atraumatic Eyes:  sclerae anicteric, EOMI Ears: Normal auditory acuity Mouth: No deformity or lesions Neck: Supple, no masses or thyromegaly Lungs: Clear throughout to auscultation Heart: Regular rate and rhythm; no murmurs, rubs or bruits Abdomen: Soft, protuberant, diastases rectus and non distended. Mild diffuse tenderness to light palpation without rebound or guarding No masses, hepatosplenomegaly or hernias noted. Normal Bowel sounds Musculoskeletal: Symmetrical with no gross deformities  Skin: No lesions on visible extremities Pulses:  Normal pulses noted Extremities: No clubbing, cyanosis, edema or deformities noted Neurological: Alert oriented x 4, grossly nonfocal Cervical Nodes:  No significant cervical  adenopathy Inguinal Nodes: No significant inguinal adenopathy Psychological:  Alert and cooperative. Normal mood and affect  Assessment and Recommendations:  1. Diffuse abdominal tenderness to light palpation without other significant gastrointestinal complaints. His symptoms are due to abdominal wall tenderness.  2. Mild hepatosplenomegaly without clear etiology. Suggest a repeat ultrasound in one year assess for any interval change.  3. Colorectal cancer screening, average risk. He is due for 10 year screening examination. The risks, benefits, and alternatives to colonoscopy with possible biopsy and possible polypectomy were discussed with the patient and they consent to proceed.

## 2012-07-03 NOTE — Patient Instructions (Addendum)
We faxed record release to [facility] to get records for your prior Colonoscopy. We will contact you after we receive records and Dr. Russella Dar reviews those records.   Thank you for choosing me and [facility].  Venita Lick. Pleas Koch., MD., Clementeen Graham

## 2012-07-05 DIAGNOSIS — D126 Benign neoplasm of colon, unspecified: Secondary | ICD-10-CM

## 2012-07-05 HISTORY — DX: Benign neoplasm of colon, unspecified: D12.6

## 2012-07-17 ENCOUNTER — Ambulatory Visit (AMBULATORY_SURGERY_CENTER): Payer: Medicare Other | Admitting: *Deleted

## 2012-07-17 VITALS — Ht 74.0 in | Wt 221.0 lb

## 2012-07-17 DIAGNOSIS — Z1211 Encounter for screening for malignant neoplasm of colon: Secondary | ICD-10-CM

## 2012-07-17 MED ORDER — NA SULFATE-K SULFATE-MG SULF 17.5-3.13-1.6 GM/177ML PO SOLN: ORAL | Status: DC

## 2012-07-18 ENCOUNTER — Encounter: Payer: Self-pay | Admitting: Gastroenterology

## 2012-07-24 ENCOUNTER — Ambulatory Visit (AMBULATORY_SURGERY_CENTER): Payer: Medicare Other | Admitting: Gastroenterology

## 2012-07-24 ENCOUNTER — Other Ambulatory Visit: Payer: Self-pay | Admitting: Gastroenterology

## 2012-07-24 ENCOUNTER — Encounter: Payer: Self-pay | Admitting: Gastroenterology

## 2012-07-24 VITALS — BP 168/84 | HR 54 | Temp 97.2°F | Resp 24 | Ht 74.0 in | Wt 221.0 lb

## 2012-07-24 DIAGNOSIS — D126 Benign neoplasm of colon, unspecified: Secondary | ICD-10-CM

## 2012-07-24 DIAGNOSIS — Z1211 Encounter for screening for malignant neoplasm of colon: Secondary | ICD-10-CM

## 2012-07-24 LAB — GLUCOSE, CAPILLARY
Glucose-Capillary: 178 mg/dL — ABNORMAL HIGH (ref 70–99)
Glucose-Capillary: 165 mg/dL — ABNORMAL HIGH (ref 70–99)

## 2012-07-24 MED ORDER — SODIUM CHLORIDE 0.9 % IV SOLN: 500.0000 mL | INTRAVENOUS | Status: DC

## 2012-07-24 NOTE — Progress Notes (Signed)
Patient did not experience any of the following events: a burn prior to discharge; a fall within the facility; wrong site/side/patient/procedure/implant event; or a hospital transfer or hospital admission upon discharge from the facility. (G8907) Patient did not have preoperative order for IV antibiotic SSI prophylaxis. (G8918)  

## 2012-07-24 NOTE — Progress Notes (Signed)
Called to room to assist during endoscopic procedure.  Patient ID and intended procedure confirmed with present staff. Received instructions for my participation in the procedure from the performing physician.  

## 2012-07-24 NOTE — Op Note (Signed)
[facility] [address] [place], [address]   COLONOSCOPY PROCEDURE REPORT  PATIENT: [name], [name]  MR#: [id number] BIRTHDATE: 09-17-1933 , 78  yrs. old GENDER: Male ENDOSCOPIST: Meryl Dare, MD, [facility] REFERRED [id number] Alphonsus Sias, M.D. PROCEDURE DATE:  07/24/2012 PROCEDURE:   Colonoscopy with snare polypectomy ASA CLASS:   Class II INDICATIONS:average risk screening. MEDICATIONS: MAC sedation, administered by CRNA and propofol (Diprivan) 180mg  IV DESCRIPTION OF PROCEDURE:   After the risks benefits and alternatives of the procedure were thoroughly explained, informed consent was obtained.  A digital rectal exam revealed no abnormalities of the rectum.   The LB [id number]  endoscope was introduced through the anus and advanced to the cecum, which was identified by both the appendix and ileocecal valve. No adverse events experienced.   The quality of the prep was good, using MoviPrep  The instrument was then slowly withdrawn as the colon was fully examined.  COLON FINDINGS: Two sessile polyps measuring 5-6 mm in size were found in the transverse colon.  A polypectomy was performed with a cold snare.  The resection was complete and the polyp tissue was completely retrieved.   The colon was otherwise normal.  There was no diverticulosis, inflammation, polyps or cancers unless previously stated. Retroflexed views revealed small internal hemorrhoids. The time to cecum=1 minutes 58 seconds. Withdrawal time=9 minutes 50 seconds. The scope was withdrawn and the procedure completed.  COMPLICATIONS: There were no complications.  ENDOSCOPIC IMPRESSION: 1.   Two sessile polyps measuring 5-6 mm in the transverse colon; polypectomy performed with a cold snare 2.   Small internal hemorrhoids  RECOMMENDATIONS: 1.  Await pathology results 2.  Consider repeat colonoscopy in 5 years if health status appropriate and polyp adenomatous; otherwise no plans for  routine screening colonoscopy in 10 years given his age  eSigned:  Meryl Dare, MD, [facility] 07/24/2012 11:23 AM

## 2012-07-24 NOTE — Patient Instructions (Addendum)

## 2012-07-25 ENCOUNTER — Telehealth: Payer: Self-pay | Admitting: *Deleted

## 2012-07-25 NOTE — Telephone Encounter (Signed)
  Follow up Call-  Call back number 07/24/2012  Post procedure Call Back phone  # [phone]  Permission to leave phone message Yes     Patient questions:  Do you have a fever, pain , or abdominal swelling? no Pain Score  0 *  Have you tolerated food without any problems? yes  Have you been able to return to your normal activities? yes  Do you have any questions about your discharge instructions: Diet   no Medications  no Follow up visit  no  Do you have questions or concerns about your Care? no  Actions: * If pain score is 4 or above: No action needed, pain <4.

## 2012-07-31 ENCOUNTER — Encounter: Payer: Self-pay | Admitting: Gastroenterology

## 2012-08-14 ENCOUNTER — Telehealth: Payer: Self-pay

## 2012-08-14 DIAGNOSIS — M159 Polyosteoarthritis, unspecified: Secondary | ICD-10-CM

## 2012-08-14 NOTE — Telephone Encounter (Signed)
Pt request referral to rheumatologist, Dr Lavenia Atlas for hands hurting; pain level now 7.5; pt said can hardly open top of jar due to pain. Pt said discussed with Dr Alphonsus Sias at 06/13/12 appt.Please advise.

## 2012-09-19 ENCOUNTER — Other Ambulatory Visit: Payer: Self-pay | Admitting: *Deleted

## 2012-09-19 MED ORDER — CARVEDILOL 12.5 MG PO TABS: 12.5000 mg | ORAL_TABLET | Freq: Two times a day (BID) | ORAL | Status: DC

## 2012-09-19 NOTE — Telephone Encounter (Signed)
Refilled Carvedilol sent to [facility].

## 2012-11-12 DIAGNOSIS — E1140 Type 2 diabetes mellitus with diabetic neuropathy, unspecified: Secondary | ICD-10-CM | POA: Insufficient documentation

## 2012-11-23 ENCOUNTER — Other Ambulatory Visit: Payer: Self-pay | Admitting: *Deleted

## 2012-11-23 MED ORDER — GLIMEPIRIDE 1 MG PO TABS: ORAL_TABLET | ORAL | Status: DC

## 2012-11-26 ENCOUNTER — Telehealth: Payer: Self-pay

## 2012-11-26 NOTE — Telephone Encounter (Signed)
Pt states endocrinologist wants him to go on a new med, Degludec or Glargine (insulin) wants to get Dr. Windell Hummingbird opinion, states [facility] sent paperwork to Dr. Mariah Milling regarding this, I did receive this a.m via fax, Dr. Mariah Milling is reviewing. Pt states he needs to know by tomorrow. States if he needs to make an appt, he is willing to do that. Please advise.

## 2012-12-14 ENCOUNTER — Ambulatory Visit: Payer: Medicare Other | Admitting: Cardiovascular Disease

## 2012-12-20 ENCOUNTER — Encounter: Payer: Self-pay | Admitting: Cardiovascular Disease

## 2012-12-20 ENCOUNTER — Ambulatory Visit (INDEPENDENT_AMBULATORY_CARE_PROVIDER_SITE_OTHER): Payer: Medicare Other | Admitting: Cardiovascular Disease

## 2012-12-20 VITALS — BP 122/68 | HR 61 | Ht 74.0 in | Wt 222.5 lb

## 2012-12-20 DIAGNOSIS — M159 Polyosteoarthritis, unspecified: Secondary | ICD-10-CM

## 2012-12-20 DIAGNOSIS — R0602 Shortness of breath: Secondary | ICD-10-CM

## 2012-12-20 DIAGNOSIS — I10 Essential (primary) hypertension: Secondary | ICD-10-CM

## 2012-12-20 DIAGNOSIS — E785 Hyperlipidemia, unspecified: Secondary | ICD-10-CM

## 2012-12-20 DIAGNOSIS — E119 Type 2 diabetes mellitus without complications: Secondary | ICD-10-CM

## 2012-12-20 DIAGNOSIS — I2510 Atherosclerotic heart disease of native coronary artery without angina pectoris: Secondary | ICD-10-CM

## 2012-12-20 NOTE — Assessment & Plan Note (Signed)
Cholesterol is at goal on the current lipid regimen. No changes to the medications were made.  

## 2012-12-20 NOTE — Assessment & Plan Note (Signed)
Currently with no symptoms of angina. No further workup at this time. Continue current medication regimen. 

## 2012-12-20 NOTE — Assessment & Plan Note (Signed)
Shortness of breath has been a chronic issue. We have discussed with him various treatment options. If symptoms get worse, cardiac catheterization could be performed. No recent cardiac study for least 5 years. He will call us if he would like a catheterization.

## 2012-12-20 NOTE — Assessment & Plan Note (Signed)
Worsening left hip posterior arthritis. He is considering surgery. He would be acceptable risk for surgery

## 2012-12-20 NOTE — Patient Instructions (Signed)
You are doing well. No medication changes were made.  Please call us if you have new issues that need to be addressed before your next appt.  Your physician wants you to follow-up in: 6 months.  You will receive a reminder letter in the mail two months in advance. If you don't receive a letter, please call our office to schedule the follow-up appointment.   

## 2012-12-20 NOTE — Assessment & Plan Note (Signed)
We have encouraged continued exercise, careful diet management in an effort to lose weight. Followed at [facility] for his diabetes

## 2012-12-20 NOTE — Assessment & Plan Note (Signed)
Blood pressure is well controlled on today's visit. No changes made to the medications. 

## 2012-12-20 NOTE — Progress Notes (Signed)
Patient ID: [name], male    DOB: 20-May-1933, 79 y.o.   MRN: [id number]  HPI Comments: 79 year old gentleman with a history of coronary artery disease, bypass surgery in November 2006, history of diabetes, hyperlipidemia, hypertension, erectile dysfunction who presents for routine followup.     He denies having any significant chest pain with exertion. He continues to bite 3 days per week. Weight is up several pounds. He is uncertain if he is retaining fluid, or having side effects from insulin, or eating more. Possibly less active as he has been very busy doing other things. Hemoglobin A1c in the low sevens. Now participating in a study at [facility] where they manage his insulin . Overall he has no new symptoms and feels he is at his baseline  Echocardiogram April 2009 shows normal systolic function with mild LVH, diastolic relaxation abnormality, mildly enlarged left atrium, mild aortic insufficiency.    Cardiac CT scan in March 2009 showed severe coronary artery disease with patent grafts x4, saphenous vein graft to the PDA, OM, diagonal and a LIMA graft to the LAD.    Labs from May 2011 shows total cholesterol 138, LDL 69, HDL 32, hemoglobin A1c around 7    EKG shows normal sinus rhythm with rate 61 beats per minute, T-wave abnormality noted in lead 3, aVF which is more pronounced compared to prior EKGs    Outpatient Encounter Prescriptions as of 12/20/2012  Medication Sig Dispense Refill  . acetaminophen (TYLENOL) 650 MG CR tablet Take 650 mg by mouth 3 (three) times daily. For hand arthritis      . aspirin 81 MG tablet Take 162 mg by mouth daily.       Marland Kitchen atorvastatin (LIPITOR) 40 MG tablet Take 0.5 tablets (20 mg total) by mouth daily.  90 tablet  5  . Biotin 1000 MCG tablet Take 1,000 mcg by mouth daily.      . carvedilol (COREG) 12.5 MG tablet Take 1 tablet (12.5 mg total) by mouth 2 (two) times daily with a meal.  180 tablet  3  . finasteride (PROSCAR) 5 MG tablet Take 1 tablet  (5 mg total) by mouth daily.  90 tablet  1  . glimepiride (AMARYL) 1 MG tablet TAKE 1 TABLET EVERY DAY  30 tablet  5  . omeprazole (PRILOSEC) 20 MG capsule Take 20 mg by mouth daily.       Marland Kitchen oxybutynin (DITROPAN) 5 MG tablet Take 1 tablet (5 mg total) by mouth daily.  30 tablet  3  . ramipril (ALTACE) 10 MG capsule Take 1 capsule (10 mg total) by mouth daily.  90 capsule  3  . [DISCONTINUED] Insulin Pen Needle (B-D UF III MINI PEN NEEDLES) 31G X 5 MM MISC Use once daily as needed, dx: 250.00  100 each  11  . [DISCONTINUED] Glucosamine-Chondroit-Vit C-Mn (GLUCOSAMINE 1500 COMPLEX) CAPS Take 1 capsule by mouth daily.      . [DISCONTINUED] insulin detemir (LEVEMIR) 100 UNIT/ML injection Inject 40 Units into the skin at bedtime.        No facility-administered encounter medications on file as of 12/20/2012.    Review of Systems  Constitutional: Negative.   HENT: Negative.   Eyes: Negative.   Respiratory: Positive for shortness of breath.   Cardiovascular: Negative.   Gastrointestinal: Negative.   Endocrine: Negative.   Musculoskeletal: Negative.        Pain in the ball of his foot  Skin: Negative.   Allergic/Immunologic: Negative.   Neurological:  Negative.   Hematological: Negative.   Psychiatric/Behavioral: Negative.   All other systems reviewed and are negative.    BP 122/68  Pulse 61  Ht 6\' 2"  (1.88 m)  Wt 222 lb 8 oz (100.925 kg)  BMI 28.56 kg/m2  Physical Exam  Nursing note and vitals reviewed. Constitutional: He is oriented to person, place, and time. He appears well-developed and well-nourished.  Obese  HENT:  Head: Normocephalic.  Nose: Nose normal.  Mouth/Throat: Oropharynx is clear and moist.  Eyes: Conjunctivae are normal. Pupils are equal, round, and reactive to light.  Neck: Normal range of motion. Neck supple. No JVD present.  Cardiovascular: Normal rate, regular rhythm, S1 normal, S2 normal, normal heart sounds and intact distal pulses.  Exam reveals no  gallop and no friction rub.   No murmur heard. Pulmonary/Chest: Effort normal and breath sounds normal. No respiratory distress. He has no wheezes. He has no rales. He exhibits no tenderness.  Abdominal: Soft. Bowel sounds are normal. He exhibits no distension. There is no tenderness.  Musculoskeletal: Normal range of motion. He exhibits no edema and no tenderness.  Lymphadenopathy:    He has no cervical adenopathy.  Neurological: He is alert and oriented to person, place, and time. Coordination normal.  Skin: Skin is warm and dry. No rash noted. No erythema.  Psychiatric: He has a normal mood and affect. His behavior is normal. Judgment and thought content normal.      Assessment and Plan

## 2013-01-10 ENCOUNTER — Other Ambulatory Visit: Payer: Self-pay

## 2013-01-24 ENCOUNTER — Encounter: Payer: Self-pay | Admitting: Family Medicine

## 2013-01-24 ENCOUNTER — Ambulatory Visit (INDEPENDENT_AMBULATORY_CARE_PROVIDER_SITE_OTHER): Payer: Medicare Other | Admitting: Family Medicine

## 2013-01-24 VITALS — BP 118/64 | HR 76 | Temp 98.3°F | Wt 213.5 lb

## 2013-01-24 DIAGNOSIS — R252 Cramp and spasm: Secondary | ICD-10-CM

## 2013-01-24 DIAGNOSIS — R498 Other voice and resonance disorders: Secondary | ICD-10-CM

## 2013-01-24 DIAGNOSIS — R499 Unspecified voice and resonance disorder: Secondary | ICD-10-CM | POA: Insufficient documentation

## 2013-01-24 DIAGNOSIS — G4762 Sleep related leg cramps: Secondary | ICD-10-CM | POA: Insufficient documentation

## 2013-01-24 DIAGNOSIS — R5381 Other malaise: Secondary | ICD-10-CM | POA: Insufficient documentation

## 2013-01-24 LAB — TSH: TSH: 1.19 u[IU]/mL (ref 0.35–5.50)

## 2013-01-24 LAB — CBC WITH DIFFERENTIAL/PLATELET
Basophils Absolute: 0 10*3/uL (ref 0.0–0.1)
Basophils Relative: 0.3 % (ref 0.0–3.0)
Eosinophils Relative: 0.7 % (ref 0.0–5.0)
Lymphocytes Relative: 15.5 % (ref 12.0–46.0)
MCV: 93.9 fl (ref 78.0–100.0)
Monocytes Relative: 7.2 % (ref 3.0–12.0)
Neutrophils Relative %: 76.3 % (ref 43.0–77.0)
Platelets: 121 10*3/uL — ABNORMAL LOW (ref 150.0–400.0)
RBC: 4.75 Mil/uL (ref 4.22–5.81)
RDW: 13.3 % (ref 11.5–14.6)
WBC: 11.2 10*3/uL — ABNORMAL HIGH (ref 4.5–10.5)

## 2013-01-24 LAB — COMPREHENSIVE METABOLIC PANEL
Albumin: 3.9 g/dL (ref 3.5–5.2)
CO2: 28 mEq/L (ref 19–32)
Chloride: 105 mEq/L (ref 96–112)
GFR: 57.37 mL/min — ABNORMAL LOW (ref >60.00)
Glucose, Bld: 96 mg/dL (ref 70–99)
Potassium: 4.9 mEq/L (ref 3.5–5.1)
Sodium: 137 mEq/L (ref 135–145)
Total Protein: 6.1 g/dL (ref 6.0–8.3)

## 2013-01-24 NOTE — Assessment & Plan Note (Addendum)
No significant weight loss noted as he states his normal weight is 216lbs. No red flags today. check for reversible causes with CBC TSH and CMP. ?related to steroid injection induced hyperglycemia given temporal relationship to steroid shot and fatigue.  rec give more time and see if feels better with better control of sugars (which he endorses over last 2 days). If persistent, recommended return to [place] - will merit further evaluation. Reviewed CT scan from 201 - consider prostate eval if unresolved sxs.

## 2013-01-24 NOTE — Assessment & Plan Note (Signed)
Check CK and K+ today. Has not responded to potassium or quinine tablets.

## 2013-01-24 NOTE — Progress Notes (Signed)
Pre-visit discussion using our clinic review tool. No additional management support is needed unless otherwise documented below in the visit note.  

## 2013-01-24 NOTE — Progress Notes (Signed)
Subjective:    Patient ID: [name] [name], male    DOB: April 04, 1933, 79 y.o.   MRN: [id number]  HPI CC: fatigue, leg cramps  79 yo patient of Dr. Karle Starch with h/o CAD s/p CABG 2006, HTN, T2DM followed by endocrinologist (latest A1c 6.5%), presents with persistent worsening leg cramps over last 2 weeks.  Describes bilateral charley horse sensations in calves.  Cramping worse at night or whenever he elevates his legs.  Has tried OTC potassium, has tried OTC quinine tablet which haven't helped.  Banana caused increased sugars.  Has noticed fatigue over the last 1 week as well.  Also endorses 1 mo h/o change in voice - higher pitch than normal, and family has also noticed.  Not hoarse.  No recent illness, no sore throat, no cough.  GERD stable on omeprazole.  No dysphagia. Weight overall stable.  No changes in diet or activity.   Wt Readings from Last 3 Encounters:  01/24/13 213 lb 8 oz (96.843 kg)  12/20/12 222 lb 8 oz (100.925 kg)  07/24/12 221 lb (100.245 kg)  No fevers/chills, new abdominal discomfort, chest pain or tightness, dyspnea, night sweats.  No new rashes, joint pains, nausea/vomiting, blood in stool.  Denies dizziness or heart palpitations.  No congestion, viral infection ,cough, wheezing, ST.  No early satiety, dysphagia.  Recent treatment for arthritis - 2 wks ago had 2 steroid shots (R knee and L GTB).  Sugars have been persistently elevated after this over last 2 weeks (150-160 fasting, 300s pm).  Increased his insulin 4 units and noted improvement in sugars with this.  On [facility] insulin study. Lab Results  Component Value Date   HGBA1C 7.5* 06/13/2012   Recently evaluated for HSM without cause by GI - rec rpt [place] in 1 year's time.  Stays sore in abdomen "like if someone had punched me".  Thought MSK pain. Saw cardiology last month, note reviewed, thought stable from cardiac perspective. Did receive flu shot this year.  Past Medical History  Diagnosis Date  . Coronary artery  disease   . Hyperlipidemia   . Hypertension   . Type II or unspecified type diabetes mellitus without mention of complication, not stated as uncontrolled 2000  . Hypertrophy of prostate without urinary obstruction and other lower urinary tract symptoms (LUTS)   . GERD (gastroesophageal reflux disease)   . Perirectal fistula   . Cataract     Review of Systems Per HPI    Objective:   Physical Exam  Nursing note and vitals reviewed. Constitutional: He appears well-developed and well-nourished. No distress.  HENT:  Head: Normocephalic and atraumatic.  Right Ear: Tympanic membrane, external ear and ear canal normal.  Left Ear: Tympanic membrane, external ear and ear canal normal.  Nose: Nose normal. No mucosal edema or rhinorrhea. Right sinus exhibits no maxillary sinus tenderness and no frontal sinus tenderness. Left sinus exhibits no maxillary sinus tenderness and no frontal sinus tenderness.  Mouth/Throat: Oropharynx is clear and moist. No oropharyngeal exudate.  Eyes: Conjunctivae and EOM are normal. Pupils are equal, round, and reactive to light. No scleral icterus.  Neck: Normal range of motion. Neck supple.  Cardiovascular: Normal rate, regular rhythm, normal heart sounds and intact distal pulses.   No murmur heard. Pulmonary/Chest: Effort normal and breath sounds normal. No respiratory distress. He has no wheezes. He has no rales.  Abdominal: Soft. Normal appearance and bowel sounds are normal. He exhibits no distension and no mass. There is no hepatosplenomegaly. There is tenderness (  mild to palpation throughout) in the right upper quadrant, epigastric area, periumbilical area and left upper quadrant. There is no rigidity, no rebound, no guarding, no CVA tenderness and negative Murphy's sign.  Musculoskeletal: He exhibits no edema.  Some soreness of calf muscles but no palpable cords or mass.  Lymphadenopathy:    He has no cervical adenopathy.  Skin: Skin is warm and dry. No rash  noted.       Assessment & Plan:

## 2013-01-24 NOTE — Patient Instructions (Signed)
Blood work today. We will call you with results. Keep an eye on weight - if continues to drop please let us know. If persistent symptoms or any worsening, please return to see [place] for further evaluation.

## 2013-01-24 NOTE — Assessment & Plan Note (Signed)
Higher pitch over the last month, but no other concerning sxs and exam WNL.  Discussed if persistent voice change, would consider ENT referral.

## 2013-02-04 ENCOUNTER — Telehealth: Payer: Self-pay | Admitting: *Deleted

## 2013-02-04 DIAGNOSIS — I25810 Atherosclerosis of coronary artery bypass graft(s) without angina pectoris: Secondary | ICD-10-CM

## 2013-02-04 DIAGNOSIS — I10 Essential (primary) hypertension: Secondary | ICD-10-CM

## 2013-02-04 DIAGNOSIS — G4762 Sleep related leg cramps: Secondary | ICD-10-CM

## 2013-02-04 NOTE — Telephone Encounter (Signed)
Spoke w/ pt.  He reports the he recently had cortisone inj in knee and hip. Reports worsening leg cramps at night, every 1 to 1-1/2 hrs. Read that statins can cause cramps.  Has not taken atorvastatin x 3 days with no improvement. Would like to know how long he should be off of med before he knows whether that's the culprit or not. If no improvement, he would like a referral to a "crampologist". Please advise.  Thank you!

## 2013-02-04 NOTE — Telephone Encounter (Signed)
Patient having severe leg cramps at night. Is worried its his state. Please advise

## 2013-02-04 NOTE — Telephone Encounter (Signed)
Would take extra potassium and magnesium supplements, stay hydrated, hold the atorvastatin for now until symptoms better. Light stretching, no sitting around. No heavy exercise to stress the muscle, just gentle.  Could try Gatorade Other people have tried quinine water

## 2013-02-05 NOTE — Telephone Encounter (Signed)
Would check labs: CK,  sed rate, crp  Consider ABIs to check blood flow in legs

## 2013-02-05 NOTE — Telephone Encounter (Signed)
Spoke w/ pt.  Reports that he has tried "everything".  K+ & Mg supplements x 2 weeks with no relief. PCP drew labs but has not heard back. Has tried vinegar, mustard, gatorade, quinine water, and "everything on the internet" but cramps seem to be worsening. He has discussed w/ PCP, but states that it is becoming unbearable and would like to explore other options to find some type of relief.  Please advise.  Thank you!

## 2013-02-05 NOTE — Telephone Encounter (Signed)
Left message for pt to call back  °

## 2013-02-06 NOTE — Telephone Encounter (Signed)
Spoke w/ pt.  He is agreeable to having labs drawn today.  Will leave paperwork at front desk for him to take to [facility]. Pt is interested in setting up ABI.  He will check his calendar and schedule this when he comes in to p/u paperwork.

## 2013-02-08 ENCOUNTER — Ambulatory Visit: Payer: Self-pay | Admitting: Cardiovascular Disease

## 2013-02-08 ENCOUNTER — Other Ambulatory Visit: Payer: Self-pay

## 2013-02-08 ENCOUNTER — Ambulatory Visit: Payer: Medicare Other

## 2013-02-08 VITALS — BP 166/79 | HR 65 | Ht 72.0 in | Wt 212.0 lb

## 2013-02-08 DIAGNOSIS — R0781 Pleurodynia: Secondary | ICD-10-CM

## 2013-02-08 NOTE — Progress Notes (Signed)
Pt presents as walk-in today stating that he has rt sided rib pain x 10 days. States that he has had a broken rib previously and this "kind of feels like that" although he denies fall or other injury. Describes it as "agonizing pain" and his fiancee insisted that he come over for chest x-ray. Pt reports that he does not feel like he needs to see Dr. Mariah Milling, he is requesting that he be sent over for an x-ray, as is family is concerned about him.  Sent pt to [facility] for rt side rib x-ray. Will call w/ results.

## 2013-02-11 ENCOUNTER — Telehealth: Payer: Self-pay

## 2013-02-11 NOTE — Telephone Encounter (Signed)
Left detailed message on pt's voicemail that results of rib x-ray showed no obvious fracture. Advised him to see his PCP if he continues to have pain.

## 2013-02-12 ENCOUNTER — Encounter (INDEPENDENT_AMBULATORY_CARE_PROVIDER_SITE_OTHER): Payer: Medicare Other

## 2013-02-12 DIAGNOSIS — I739 Peripheral vascular disease, unspecified: Secondary | ICD-10-CM

## 2013-02-12 DIAGNOSIS — R252 Cramp and spasm: Secondary | ICD-10-CM

## 2013-02-22 ENCOUNTER — Emergency Department: Payer: Self-pay | Admitting: Emergency Medicine

## 2013-02-22 LAB — CBC
HCT: 40.6 % (ref 40.0–52.0)
MCHC: 34.7 g/dL (ref 32.0–36.0)
MCV: 92 fL (ref 80–100)
RBC: 4.4 10*6/uL (ref 4.40–5.90)
RDW: 13.3 % (ref 11.5–14.5)

## 2013-02-22 LAB — URINALYSIS, COMPLETE
Bacteria: NONE SEEN
Bilirubin,UR: NEGATIVE
Blood: NEGATIVE
Glucose,UR: NEGATIVE mg/dL (ref 0–75)
Leukocyte Esterase: NEGATIVE
Ph: 5 (ref 4.5–8.0)
Specific Gravity: 1.018 (ref 1.003–1.030)

## 2013-02-22 LAB — COMPREHENSIVE METABOLIC PANEL
Albumin: 3.5 g/dL (ref 3.4–5.0)
Alkaline Phosphatase: 113 U/L
Anion Gap: 6 — ABNORMAL LOW (ref 7–16)
BUN: 20 mg/dL — ABNORMAL HIGH (ref 7–18)
Calcium, Total: 9.1 mg/dL (ref 8.5–10.1)
Chloride: 109 mmol/L — ABNORMAL HIGH (ref 98–107)
Creatinine: 1.18 mg/dL (ref 0.60–1.30)
Glucose: 197 mg/dL — ABNORMAL HIGH (ref 65–99)
Osmolality: 286 (ref 275–301)
Potassium: 4.5 mmol/L (ref 3.5–5.1)
SGOT(AST): 20 U/L (ref 15–37)
SGPT (ALT): 42 U/L (ref 12–78)
Sodium: 139 mmol/L (ref 136–145)
Total Protein: 6.2 g/dL — ABNORMAL LOW (ref 6.4–8.2)

## 2013-02-22 LAB — TROPONIN I: Troponin-I: <0.02 ng/mL

## 2013-02-22 LAB — HEMOGLOBIN A1C: HEMOGLOBIN A1C: 6.8 % — AB (ref 4.0–6.0)

## 2013-03-07 ENCOUNTER — Other Ambulatory Visit: Payer: Self-pay | Admitting: Internal Medicine

## 2013-05-06 LAB — HM DIABETES EYE EXAM

## 2013-05-29 ENCOUNTER — Other Ambulatory Visit: Payer: Self-pay | Admitting: *Deleted

## 2013-05-29 MED ORDER — OXYBUTYNIN CHLORIDE 5 MG PO TABS: 5.0000 mg | ORAL_TABLET | Freq: Every day | ORAL | Status: DC

## 2013-06-03 ENCOUNTER — Emergency Department (HOSPITAL_COMMUNITY): Payer: Medicare Other

## 2013-06-03 ENCOUNTER — Inpatient Hospital Stay (HOSPITAL_COMMUNITY)
Admission: EM | Admit: 2013-06-03 | Discharge: 2013-06-06 | DRG: 287 | Disposition: A | Discharge status: Home / Self Care | Payer: Medicare Other | Attending: Internal Medicine | Admitting: Internal Medicine

## 2013-06-03 ENCOUNTER — Encounter (HOSPITAL_COMMUNITY): Payer: Self-pay | Admitting: Emergency Medicine

## 2013-06-03 DIAGNOSIS — E119 Type 2 diabetes mellitus without complications: Secondary | ICD-10-CM

## 2013-06-03 DIAGNOSIS — Z79899 Other long term (current) drug therapy: Secondary | ICD-10-CM

## 2013-06-03 DIAGNOSIS — E785 Hyperlipidemia, unspecified: Secondary | ICD-10-CM | POA: Diagnosis present

## 2013-06-03 DIAGNOSIS — I2510 Atherosclerotic heart disease of native coronary artery without angina pectoris: Principal | ICD-10-CM

## 2013-06-03 DIAGNOSIS — E1169 Type 2 diabetes mellitus with other specified complication: Secondary | ICD-10-CM | POA: Diagnosis present

## 2013-06-03 DIAGNOSIS — E118 Type 2 diabetes mellitus with unspecified complications: Secondary | ICD-10-CM | POA: Diagnosis present

## 2013-06-03 DIAGNOSIS — I10 Essential (primary) hypertension: Secondary | ICD-10-CM

## 2013-06-03 DIAGNOSIS — Z8249 Family history of ischemic heart disease and other diseases of the circulatory system: Secondary | ICD-10-CM

## 2013-06-03 DIAGNOSIS — E861 Hypovolemia: Secondary | ICD-10-CM | POA: Diagnosis present

## 2013-06-03 DIAGNOSIS — Z801 Family history of malignant neoplasm of trachea, bronchus and lung: Secondary | ICD-10-CM

## 2013-06-03 DIAGNOSIS — I2582 Chronic total occlusion of coronary artery: Secondary | ICD-10-CM | POA: Diagnosis present

## 2013-06-03 DIAGNOSIS — Z951 Presence of aortocoronary bypass graft: Secondary | ICD-10-CM

## 2013-06-03 DIAGNOSIS — Z7982 Long term (current) use of aspirin: Secondary | ICD-10-CM

## 2013-06-03 DIAGNOSIS — D696 Thrombocytopenia, unspecified: Secondary | ICD-10-CM | POA: Diagnosis not present

## 2013-06-03 DIAGNOSIS — K219 Gastro-esophageal reflux disease without esophagitis: Secondary | ICD-10-CM | POA: Diagnosis present

## 2013-06-03 DIAGNOSIS — G909 Disorder of the autonomic nervous system, unspecified: Secondary | ICD-10-CM | POA: Diagnosis present

## 2013-06-03 DIAGNOSIS — R0602 Shortness of breath: Secondary | ICD-10-CM

## 2013-06-03 DIAGNOSIS — Z9861 Coronary angioplasty status: Secondary | ICD-10-CM

## 2013-06-03 DIAGNOSIS — N400 Enlarged prostate without lower urinary tract symptoms: Secondary | ICD-10-CM | POA: Diagnosis present

## 2013-06-03 DIAGNOSIS — E1149 Type 2 diabetes mellitus with other diabetic neurological complication: Secondary | ICD-10-CM | POA: Diagnosis present

## 2013-06-03 LAB — I-STAT TROPONIN, ED: Troponin i, poc: 0 ng/mL (ref 0.00–0.08)

## 2013-06-03 LAB — BASIC METABOLIC PANEL
BUN: 23 mg/dL (ref 6–23)
CO2: 23 meq/L (ref 19–32)
Calcium: 9.1 mg/dL (ref 8.4–10.5)
Chloride: 104 mEq/L (ref 96–112)
Creatinine, Ser: 1.27 mg/dL (ref 0.50–1.35)
GFR calc non Af Amer: 53 mL/min — ABNORMAL LOW (ref >90)
GFR, EST AFRICAN AMERICAN: 61 mL/min — AB (ref >90)
Glucose, Bld: 326 mg/dL — ABNORMAL HIGH (ref 70–99)
POTASSIUM: 4.9 meq/L (ref 3.7–5.3)
Sodium: 139 mEq/L (ref 137–147)

## 2013-06-03 LAB — URINALYSIS, ROUTINE W REFLEX MICROSCOPIC
Bilirubin Urine: NEGATIVE
Glucose, UA: >1000 mg/dL — AB
Hgb urine dipstick: NEGATIVE
Ketones, ur: NEGATIVE mg/dL
Leukocytes, UA: NEGATIVE
Nitrite: NEGATIVE
Protein, ur: NEGATIVE mg/dL
Specific Gravity, Urine: 1.027 (ref 1.005–1.030)
Urobilinogen, UA: 0.2 mg/dL (ref 0.0–1.0)
pH: 5 (ref 5.0–8.0)

## 2013-06-03 LAB — CBC
HCT: 44.3 % (ref 39.0–52.0)
Hemoglobin: 16.4 g/dL (ref 13.0–17.0)
MCH: 33.7 pg (ref 26.0–34.0)
MCHC: 37 g/dL — ABNORMAL HIGH (ref 30.0–36.0)
MCV: 91 fL (ref 78.0–100.0)
Platelets: 115 10*3/uL — ABNORMAL LOW (ref 150–400)
RBC: 4.87 MIL/uL (ref 4.22–5.81)
RDW: 12.7 % (ref 11.5–15.5)
WBC: 9.5 10*3/uL (ref 4.0–10.5)

## 2013-06-03 LAB — CBG MONITORING, ED: Glucose-Capillary: 283 mg/dL — ABNORMAL HIGH (ref 70–99)

## 2013-06-03 LAB — URINE MICROSCOPIC-ADD ON

## 2013-06-03 MED ORDER — FINASTERIDE 5 MG PO TABS
5.0000 mg | ORAL_TABLET | Freq: Every day | ORAL | Status: DC
  Administered 2013-06-04 – 2013-06-05 (×2): 5 mg via ORAL
  Filled 2013-06-03 (×3): qty 1

## 2013-06-03 MED ORDER — INSULIN GLARGINE 100 UNIT/ML ~~LOC~~ SOLN
10.0000 [IU] | Freq: Every day | SUBCUTANEOUS | Status: DC
  Administered 2013-06-04 – 2013-06-05 (×2): 10 [IU] via SUBCUTANEOUS
  Filled 2013-06-03 (×4): qty 0.1

## 2013-06-03 MED ORDER — VITAMIN B-12 1000 MCG PO TABS
1000.0000 ug | ORAL_TABLET | Freq: Every day | ORAL | Status: DC
  Administered 2013-06-04 – 2013-06-06 (×3): 1000 ug via ORAL
  Filled 2013-06-03 (×3): qty 1

## 2013-06-03 MED ORDER — CARVEDILOL 12.5 MG PO TABS
12.5000 mg | ORAL_TABLET | Freq: Two times a day (BID) | ORAL | Status: DC
  Administered 2013-06-04 – 2013-06-06 (×5): 12.5 mg via ORAL
  Filled 2013-06-03 (×7): qty 1

## 2013-06-03 MED ORDER — PANTOPRAZOLE SODIUM 40 MG PO TBEC
40.0000 mg | DELAYED_RELEASE_TABLET | Freq: Every day | ORAL | Status: DC
Start: 2013-06-04 — End: 2013-06-06
  Administered 2013-06-04 – 2013-06-06 (×3): 40 mg via ORAL
  Filled 2013-06-03 (×3): qty 1

## 2013-06-03 MED ORDER — ASPIRIN 81 MG PO CHEW
162.0000 mg | CHEWABLE_TABLET | Freq: Every day | ORAL | Status: DC
  Filled 2013-06-03: qty 2

## 2013-06-03 MED ORDER — ACETAMINOPHEN 325 MG PO TABS: 650.0000 mg | ORAL_TABLET | ORAL | Status: DC | PRN

## 2013-06-03 MED ORDER — RAMIPRIL 10 MG PO CAPS
10.0000 mg | ORAL_CAPSULE | Freq: Every day | ORAL | Status: DC
  Administered 2013-06-04 – 2013-06-06 (×3): 10 mg via ORAL
  Filled 2013-06-03 (×3): qty 1

## 2013-06-03 MED ORDER — OXYBUTYNIN CHLORIDE 5 MG PO TABS
5.0000 mg | ORAL_TABLET | Freq: Every day | ORAL | Status: DC
  Administered 2013-06-04 – 2013-06-05 (×2): 5 mg via ORAL
  Filled 2013-06-03 (×3): qty 1

## 2013-06-03 MED ORDER — ONDANSETRON HCL 4 MG/2ML IJ SOLN: 4.0000 mg | Freq: Four times a day (QID) | INTRAMUSCULAR | Status: DC | PRN

## 2013-06-03 MED ORDER — INSULIN ASPART 100 UNIT/ML ~~LOC~~ SOLN
0.0000 [IU] | SUBCUTANEOUS | Status: DC
  Administered 2013-06-04: 8 [IU] via SUBCUTANEOUS
  Administered 2013-06-04 (×2): 5 [IU] via SUBCUTANEOUS
  Administered 2013-06-04: 2 [IU] via SUBCUTANEOUS
  Administered 2013-06-05 (×3): 3 [IU] via SUBCUTANEOUS

## 2013-06-03 MED ORDER — ACETAMINOPHEN ER 650 MG PO TBCR: 650.0000 mg | EXTENDED_RELEASE_TABLET | Freq: Three times a day (TID) | ORAL | Status: DC | PRN

## 2013-06-03 MED ORDER — HEPARIN SODIUM (PORCINE) 5000 UNIT/ML IJ SOLN
5000.0000 [IU] | Freq: Three times a day (TID) | INTRAMUSCULAR | Status: DC
  Administered 2013-06-04 – 2013-06-05 (×4): 5000 [IU] via SUBCUTANEOUS
  Filled 2013-06-03 (×7): qty 1

## 2013-06-03 NOTE — H&P (Signed)
[facility] History and Physical  [name] [name] OEU:[id number] DOB: 27-Dec-1933 DOA: 06/03/2013  Referring physician: EDP PCP: Viviana Simpler, MD   Chief Complaint: SOB   HPI: [name] [name] is a 80 y.o. male who presents to the ED with SOB and generalized weakness.  SOB is worse with exertion, has been progressively worsening for months but is especially worse since Thurs of last week.  Patient has an extensive history of CAD including multiple stenting procedures followed by a bypass in 2005.  He was last seen in October of last year for his SOB by his cardiologist Dr. Rockey Situ, and at that time per his note it appears the patient was offered heart cath.  Patient wanted to "think about it" and so he was to call Dr. Rockey Situ "if he would like a catheterization".  Review of Systems: Systems reviewed.  As above, otherwise negative  Past Medical History  Diagnosis Date  . Coronary artery disease   . Hyperlipidemia   . Hypertension   . Type II or unspecified type diabetes mellitus without mention of complication, not stated as uncontrolled 2000  . Hypertrophy of prostate without urinary obstruction and other lower urinary tract symptoms (LUTS)   . GERD (gastroesophageal reflux disease)   . Perirectal fistula   . Cataract    Past Surgical History  Procedure Laterality Date  . Coronary artery bypass graft  2007    x 4  . Foot surgery  2012    right foot  . Lumbar laminectomy  1989  . Coronary angioplasty with stent placement      before CABG  . Joint replacement  8/10    Right THR--[name]  . Colonoscopy w/ polypectomy    . Cholecystectomy  05/15/2011    Procedure: LAPAROSCOPIC CHOLECYSTECTOMY;  Surgeon: Rolm Bookbinder, MD;  Location: [facility] OR;  Service: General;  Laterality: N/A;  . Cataract extraction  sept 2013    right   Social History:  reports that he has never smoked. He has never used smokeless tobacco. He reports that he drinks alcohol. He reports that he does not  use illicit drugs.  No Known Allergies  Family History  Problem Relation Age of Onset  . Lung cancer Mother   . Heart disease Father   . Colon cancer Neg Hx      Prior to Admission medications   Medication Sig Start Date End Date Taking? Authorizing Provider  acetaminophen (TYLENOL) 650 MG CR tablet Take 650 mg by mouth every 8 (eight) hours as needed for pain. For hand arthritis   Yes Historical Provider, MD  Ascorbic Acid (VITAMIN C) 100 MG tablet Take 100 mg by mouth daily.   Yes Historical Provider, MD  aspirin 81 MG tablet Take 162 mg by mouth daily.    Yes Historical Provider, MD  Biotin 1000 MCG tablet Take 1,000 mcg by mouth daily.   Yes Historical Provider, MD  carvedilol (COREG) 12.5 MG tablet Take 1 tablet (12.5 mg total) by mouth 2 (two) times daily with a meal. 09/19/12  Yes Minna Merritts, MD  Cholecalciferol (VITAMIN D) 2000 UNITS tablet Take 4,000 Units by mouth daily.   Yes Historical Provider, MD  [facility] Liver Oil CAPS Take 1 capsule by mouth daily.   Yes Historical Provider, MD  finasteride (PROSCAR) 5 MG tablet Take 1 tablet (5 mg total) by mouth daily. 04/16/12  Yes Venia Carbon, MD  glimepiride (AMARYL) 1 MG tablet Take 1 mg by mouth daily with breakfast.  Yes Historical Provider, MD  Homeopathic Products (EARACHE DROPS) SOLN Place 4 drops in ear(s) daily as needed (for ear pain).   Yes Historical Provider, MD  Multiple Vitamin (MULTIVITAMIN) tablet Take 1 tablet by mouth daily.   Yes Historical Provider, MD  omeprazole (PRILOSEC) 20 MG capsule Take 20 mg by mouth daily.   Yes Historical Provider, MD  oxybutynin (DITROPAN) 5 MG tablet Take 1 tablet (5 mg total) by mouth daily. 05/29/13  Yes Venia Carbon, MD  Pyridoxine HCl (B-6 PO) Take 1 tablet by mouth daily.   Yes Historical Provider, MD  ramipril (ALTACE) 10 MG capsule Take 1 capsule (10 mg total) by mouth daily. 06/25/12  Yes Minna Merritts, MD  STUDY MEDICATION Inject 24 Units into the skin at bedtime.    Yes Historical Provider, MD  vitamin B-12 (CYANOCOBALAMIN) 1000 MCG tablet Take 1,000 mcg by mouth daily.   Yes Historical Provider, MD   Physical Exam: Filed Vitals:   06/03/13 2315  BP: 147/58  Pulse: 61  Temp:   Resp: 14    BP 147/58  Pulse 61  Temp(Src) 97.6 F (36.4 C) (Oral)  Resp 14  Ht 6\' 2"  (1.88 m)  Wt 97.523 kg (215 lb)  BMI 27.59 kg/m2  SpO2 97%  General Appearance:    Alert, oriented, no distress, appears stated age  Head:    Normocephalic, atraumatic  Eyes:    PERRL, EOMI, sclera non-icteric        Nose:   Nares without drainage or epistaxis. Mucosa, turbinates normal  Throat:   Moist mucous membranes. Oropharynx without erythema or exudate.  Neck:   Supple. No carotid bruits.  No thyromegaly.  No lymphadenopathy.   Back:     No CVA tenderness, no spinal tenderness  Lungs:     Clear to auscultation bilaterally, without wheezes, rhonchi or rales  Chest wall:    No tenderness to palpitation  Heart:    Regular rate and rhythm without murmurs, gallops, rubs  Abdomen:     Soft, non-tender, nondistended, normal bowel sounds, no organomegaly  Genitalia:    deferred  Rectal:    deferred  Extremities:   No clubbing, cyanosis or edema.  Pulses:   2+ and symmetric all extremities  Skin:   Skin color, texture, turgor normal, no rashes or lesions  Lymph nodes:   Cervical, supraclavicular, and axillary nodes normal  Neurologic:   CNII-XII intact. Normal strength, sensation and reflexes      throughout    Labs on Admission:  Basic Metabolic Panel:  Recent Labs Lab 06/03/13 1554  NA 139  K 4.9  CL 104  CO2 23  GLUCOSE 326*  BUN 23  CREATININE 1.27  CALCIUM 9.1   Liver Function Tests: No results found for this basename: AST, ALT, ALKPHOS, BILITOT, PROT, ALBUMIN,  in the last 168 hours No results found for this basename: LIPASE, AMYLASE,  in the last 168 hours No results found for this basename: AMMONIA,  in the last 168 hours CBC:  Recent Labs Lab  06/03/13 1554  WBC 9.5  HGB 16.4  HCT 44.3  MCV 91.0  PLT 115*   Cardiac Enzymes: No results found for this basename: CKTOTAL, CKMB, CKMBINDEX, TROPONINI,  in the last 168 hours  BNP (last 3 results) No results found for this basename: PROBNP,  in the last 8760 hours CBG:  Recent Labs Lab 06/03/13 2306  GLUCAP 283*    Radiological Exams on Admission: Ct Head Wo Contrast  06/03/2013   CLINICAL DATA:  Dizziness.  EXAM: CT HEAD WITHOUT CONTRAST  TECHNIQUE: Contiguous axial images were obtained from the base of the skull through the vertex without intravenous contrast.  COMPARISON:  None.  FINDINGS: No intracranial hemorrhage.  No CT evidence of large acute infarct.  No intracranial mass lesion noted on this unenhanced exam.  No hydrocephalus.  Vascular calcifications.  IMPRESSION: No intracranial hemorrhage or CT evidence of large acute infarct.  Vascular calcifications.   Electronically Signed   By: Chauncey Cruel M.D.   On: 06/03/2013 17:02    EKG: Independently reviewed.  EKG demonstrates inferior wall ischemic changes, T wave inversions, these were present in October of last year, but were NOT present in march of last year.  Assessment/Plan Principal Problem:   Shortness of breath Active Problems:   CAD   Type II or unspecified type diabetes mellitus without mention of complication, not stated as uncontrolled   1. SOB - given the patients extensive CAD history and his 12/20/12 note by his cardiologist, will admit, consult cards, and I suspect that they will end up performing a heart cath on the patient.  Will cycle enzymes and keep him NPO in the mean time, cards is on way to evaluate.  If the EKG changes are taken in to consideration, the patients HEART score rises to an 8.  Consider 2d echo if heart cath negative, but would not delay heart cath for this at this point unless cardiology feels otherwise. 2. DM2 - holding home meds, will put patient on 10 of lantus (takes 24 of a  long acting study insulin at home), and med dose SSI q4h while he is NPO.  Cardiology has been consulted and is coming by to evaluate.  Code Status: Full Code  Family Communication: Family at bedside Disposition Plan: Admit to inpatient   Time spent: 70 min  Melek Pownall M. [facility] Pager [phone]  If 7AM-7PM, please contact the day team taking care of the patient [email or web address] Password [email or web address] 06/03/2013, 11:30 PM

## 2013-06-03 NOTE — ED Notes (Signed)
Pt reports that he has a hx of vertigo and been taking medication since christmas. States that over the past couple of days he has been feeling worse. States that he takes antivert but its not helping as much. States that he has also been generally weak. No neuro deficits noted at triage. Pt A&Ox4.

## 2013-06-03 NOTE — ED Notes (Addendum)
Pt states he has been feeling very weak since thursday, unable to ambulate per normal. Pt states he was feeling dizzy but he doesn't now. Pt denies any pain at this time. Pt states he also had an ear ache.

## 2013-06-03 NOTE — ED Notes (Signed)
Per tech, pt requested to have his cbg checked, and asked to use his own insulin. Tech states she spoke with Dr. Vanita Panda who said it was ok. Pt administered his insulin from home.

## 2013-06-03 NOTE — ED Provider Notes (Signed)
CSN: [id number]     Arrival date & time 06/03/13  1542 History   First MD Initiated Contact with Patient 06/03/13 2029     Chief Complaint  Patient presents with  . Dizziness     (Consider location/radiation/quality/duration/timing/severity/associated sxs/prior Treatment) HPI Comments: Pt with hx of CAD, HLD, HTN w/ cc: of weakness and vertigo. Had some vertigo, typical of his past vertigo 2 days ago but has resolved. Complains of having general weakness and SOB that is worse with exertion.   Patient is a 80 y.o. male presenting with weakness. The history is provided by the patient.  Weakness This is a new problem. The current episode started in the past 7 days. The problem occurs constantly. The problem has been unchanged. Associated symptoms include fatigue, vertigo (has hx of vertigo, had some 2 days ago but resolved) and weakness. Pertinent negatives include no abdominal pain, chest pain, congestion, coughing, diaphoresis, fever, headaches, nausea, numbness, rash or vomiting. The symptoms are aggravated by exertion. He has tried nothing for the symptoms. The treatment provided no relief.    Past Medical History  Diagnosis Date  . Coronary artery disease   . Hyperlipidemia   . Hypertension   . Type II or unspecified type diabetes mellitus without mention of complication, not stated as uncontrolled 2000  . Hypertrophy of prostate without urinary obstruction and other lower urinary tract symptoms (LUTS)   . GERD (gastroesophageal reflux disease)   . Perirectal fistula   . Cataract    Past Surgical History  Procedure Laterality Date  . Coronary artery bypass graft  2007    x 4  . Foot surgery  2012    right foot  . Lumbar laminectomy  1989  . Coronary angioplasty with stent placement      before CABG  . Joint replacement  8/10    Right THR--[name]  . Colonoscopy w/ polypectomy    . Cholecystectomy  05/15/2011    Procedure: LAPAROSCOPIC CHOLECYSTECTOMY;  Surgeon: Rolm Bookbinder, MD;  Location: [facility];  Service: General;  Laterality: N/A;  . Cataract extraction  sept 2013    right   Family History  Problem Relation Age of Onset  . Lung cancer Mother   . Heart disease Father   . Colon cancer Neg Hx    History  Substance Use Topics  . Smoking status: Never Smoker   . Smokeless tobacco: Never Used  . Alcohol Use: Yes     Comment: occ cocktail    Review of Systems  Constitutional: Positive for fatigue. Negative for fever, diaphoresis, activity change and appetite change.  HENT: Negative for congestion and rhinorrhea.   Eyes: Negative for discharge and itching.  Respiratory: Positive for shortness of breath. Negative for cough and wheezing.   Cardiovascular: Negative for chest pain.  Gastrointestinal: Negative for nausea, vomiting, abdominal pain, diarrhea and constipation.  Genitourinary: Negative for hematuria, decreased urine volume and difficulty urinating.  Skin: Negative for rash and wound.  Neurological: Positive for vertigo (has hx of vertigo, had some 2 days ago but resolved) and weakness. Negative for syncope, numbness and headaches.  All other systems reviewed and are negative.      Allergies  Review of patient's allergies indicates no known allergies.  Home Medications   Current Outpatient Rx  Name  Route  Sig  Dispense  Refill  . acetaminophen (TYLENOL) 650 MG CR tablet   Oral   Take 650 mg by mouth every 8 (eight) hours as needed for pain.  For hand arthritis         . Ascorbic Acid (VITAMIN C) 100 MG tablet   Oral   Take 100 mg by mouth daily.         Marland Kitchen aspirin 81 MG tablet   Oral   Take 162 mg by mouth daily.          . Biotin 1000 MCG tablet   Oral   Take 1,000 mcg by mouth daily.         . carvedilol (COREG) 12.5 MG tablet   Oral   Take 1 tablet (12.5 mg total) by mouth 2 (two) times daily with a meal.   180 tablet   3   . Cholecalciferol (VITAMIN D) 2000 UNITS tablet   Oral   Take 4,000 Units by  mouth daily.         Marland Kitchen Cod Liver Oil CAPS   Oral   Take 1 capsule by mouth daily.         . finasteride (PROSCAR) 5 MG tablet   Oral   Take 1 tablet (5 mg total) by mouth daily.   90 tablet   1   . glimepiride (AMARYL) 1 MG tablet   Oral   Take 1 mg by mouth daily with breakfast.         . Homeopathic Products (EARACHE DROPS) SOLN   Otic   Place 4 drops in ear(s) daily as needed (for ear pain).         . Multiple Vitamin (MULTIVITAMIN) tablet   Oral   Take 1 tablet by mouth daily.         Marland Kitchen omeprazole (PRILOSEC) 20 MG capsule   Oral   Take 20 mg by mouth daily.         Marland Kitchen oxybutynin (DITROPAN) 5 MG tablet   Oral   Take 1 tablet (5 mg total) by mouth daily.   30 tablet   3   . Pyridoxine HCl (B-6 PO)   Oral   Take 1 tablet by mouth daily.         . ramipril (ALTACE) 10 MG capsule   Oral   Take 1 capsule (10 mg total) by mouth daily.   90 capsule   3   . STUDY MEDICATION   Subcutaneous   Inject 24 Units into the skin at bedtime.         . vitamin B-12 (CYANOCOBALAMIN) 1000 MCG tablet   Oral   Take 1,000 mcg by mouth daily.          BP 147/58  Pulse 61  Temp(Src) 97.6 F (36.4 C) (Oral)  Resp 14  Ht 6\' 2"  (1.88 m)  Wt 215 lb (97.523 kg)  BMI 27.59 kg/m2  SpO2 97% Physical Exam  Vitals reviewed. Constitutional: He is oriented to person, place, and time. He appears well-developed and well-nourished. No distress.  HENT:  Head: Normocephalic and atraumatic.  Mouth/Throat: Oropharynx is clear and moist. No oropharyngeal exudate.  Eyes: Conjunctivae and EOM are normal. Pupils are equal, round, and reactive to light. Right eye exhibits no discharge. Left eye exhibits no discharge. No scleral icterus.  Neck: Normal range of motion. Neck supple.  Cardiovascular: Normal rate, regular rhythm, normal heart sounds and intact distal pulses.  Exam reveals no gallop and no friction rub.   No murmur heard. Pulmonary/Chest: Effort normal and breath  sounds normal. No respiratory distress. He has no wheezes. He has no rales.  Abdominal: Soft. He exhibits  no distension and no mass. There is no tenderness.  Musculoskeletal: Normal range of motion.  Neurological: He is alert and oriented to person, place, and time. No cranial nerve deficit. He exhibits normal muscle tone. Coordination normal.  5/5 strength in all exts, normal sensation in all exts, 2+ DTRs in patella and brachioradilias b/l, F2N neg, Romberg neg, ambulatory without ataxia, CNs II-XII intact  Skin: Skin is warm. No rash noted. He is not diaphoretic.    ED Course  Procedures (including critical care time) Labs Review Labs Reviewed  CBC - Abnormal; Notable for the following:    MCHC 37.0 (*)    Platelets 115 (*)    All other components within normal limits  BASIC METABOLIC PANEL - Abnormal; Notable for the following:    Glucose, Bld 326 (*)    GFR calc non Af Amer 53 (*)    GFR calc Af Amer 61 (*)    All other components within normal limits  URINALYSIS, ROUTINE W REFLEX MICROSCOPIC - Abnormal; Notable for the following:    Glucose, UA >1000 (*)    All other components within normal limits  URINE MICROSCOPIC-ADD ON - Abnormal; Notable for the following:    Casts HYALINE CASTS (*)    All other components within normal limits  CBG MONITORING, ED - Abnormal; Notable for the following:    Glucose-Capillary 283 (*)    All other components within normal limits  CBG MONITORING, ED - Abnormal; Notable for the following:    Glucose-Capillary 315 (*)    All other components within normal limits  TSH  TROPONIN I  TROPONIN I  TROPONIN I  I-STAT TROPOININ, ED   Imaging Review Ct Head Wo Contrast  06/03/2013   CLINICAL DATA:  Dizziness.  EXAM: CT HEAD WITHOUT CONTRAST  TECHNIQUE: Contiguous axial images were obtained from the base of the skull through the vertex without intravenous contrast.  COMPARISON:  None.  FINDINGS: No intracranial hemorrhage.  No CT evidence of large  acute infarct.  No intracranial mass lesion noted on this unenhanced exam.  No hydrocephalus.  Vascular calcifications.  IMPRESSION: No intracranial hemorrhage or CT evidence of large acute infarct.  Vascular calcifications.   Electronically Signed   By: Chauncey Cruel M.D.   On: 06/03/2013 17:02     EKG Interpretation None      MDM   MDM: 80 y.o. WM w/ PMHx of CAD s/p CABG in 2006 w/ cc: of DOE and general weakness for 4 days. Pt has hx of BPPV and had some veritigo 2 days ago but resolved. States he has general weakness that resolves with rest but worsens with exertion, he mentioned to me specifically walking up driveway. Denies chest pain, abd pain, n/v/d, f/c. No hx of similar. Has had similar sxs in past and saw cardiologist before who thought about cath. HEre EKG unchanged, trop negative. Concerning story for angina in high risk individual. Will admit to medicine. Remiained HDS and pain free while in ED. Admit. Care of case d/w my attending.  Final diagnoses:  CAD (coronary artery disease)  Shortness of breath  Type II or unspecified type diabetes mellitus without mention of complication, not stated as uncontrolled   Admit to [place], MD 06/04/13 0021

## 2013-06-04 DIAGNOSIS — R0602 Shortness of breath: Secondary | ICD-10-CM

## 2013-06-04 DIAGNOSIS — R42 Dizziness and giddiness: Secondary | ICD-10-CM

## 2013-06-04 LAB — GLUCOSE, CAPILLARY
GLUCOSE-CAPILLARY: 128 mg/dL — AB (ref 70–99)
Glucose-Capillary: 223 mg/dL — ABNORMAL HIGH (ref 70–99)
Glucose-Capillary: 164 mg/dL — ABNORMAL HIGH (ref 70–99)
Glucose-Capillary: 219 mg/dL — ABNORMAL HIGH (ref 70–99)
Glucose-Capillary: 293 mg/dL — ABNORMAL HIGH (ref 70–99)

## 2013-06-04 LAB — CBG MONITORING, ED: GLUCOSE-CAPILLARY: 315 mg/dL — AB (ref 70–99)

## 2013-06-04 LAB — TROPONIN I: Troponin I: <0.3 ng/mL (ref <0.30)

## 2013-06-04 LAB — HEMOGLOBIN A1C
Hgb A1c MFr Bld: 7 % — ABNORMAL HIGH (ref <5.7)
Mean Plasma Glucose: 154 mg/dL — ABNORMAL HIGH (ref <117)

## 2013-06-04 LAB — TSH: TSH: 2.08 u[IU]/mL

## 2013-06-04 MED ORDER — SODIUM CHLORIDE 0.9 % IV SOLN: 250.0000 mL | INTRAVENOUS | Status: DC | PRN

## 2013-06-04 MED ORDER — ASPIRIN 81 MG PO CHEW
81.0000 mg | CHEWABLE_TABLET | ORAL | Status: AC
  Administered 2013-06-05: 81 mg via ORAL
  Filled 2013-06-04: qty 1

## 2013-06-04 MED ORDER — SODIUM CHLORIDE 0.9 % IJ SOLN: 3.0000 mL | INTRAMUSCULAR | Status: DC | PRN

## 2013-06-04 MED ORDER — SODIUM CHLORIDE 0.9 % IJ SOLN
3.0000 mL | Freq: Two times a day (BID) | INTRAMUSCULAR | Status: DC
  Administered 2013-06-04 – 2013-06-05 (×2): 3 mL via INTRAVENOUS

## 2013-06-04 MED ORDER — SODIUM CHLORIDE 0.9 % IV SOLN
INTRAVENOUS | Status: DC
  Administered 2013-06-04: 17:00:00 via INTRAVENOUS

## 2013-06-04 NOTE — ED Provider Notes (Signed)
This patient was seen in conjunction with the resident physician, Dr. Sunny Schlein.  The documentation accurately reflects the patient's ED course.  On my exam the patient appeared calm.  He was hemodynamically stable.  EKG shows sinus rhythm, rate 67, intraventricular conduction delay, T-wave flattening in the inferior lead, abnormal Patient required admission for further evaluation and management.  Carmin Muskrat, MD 06/04/13 [phone]

## 2013-06-04 NOTE — Progress Notes (Signed)
PROGRESS NOTE  [name] [name] WPY:[id number] DOB: [birth year] DOA: 06/03/2013 PCP: Viviana Simpler, MD  HPI: [name] [name] is a 80 y.o. male who presents to the ED with SOB and generalized weakness. SOB is worse with exertion, has been progressively worsening for months but is especially worse since Thurs of last week. Patient has an extensive history of CAD including multiple stenting procedures followed by a bypass in 2005. He was last seen in October of last year for his SOB by his cardiologist Dr. Rockey Situ, and at that time per his note it appears the patient was offered heart cath. Patient wanted to "think about it" and so he was to call Dr. Rockey Situ "if he would like a catheterization".  Assessment/Plan:  DOE - cardiology consulted, appreciate input, plan for cardiac cath tomorrow.  - cardiac enzymes negative, patient is chest pain free DM2 - continue Lantus and sliding scale, sugars well controlled today. - repeat A1C CAD GERD HTN  Diet: heart healthy Fluids: NS DVT Prophylaxis: heparin  Code Status: Full Family Communication: d/w patient and wife  Disposition Plan: home when ready   Consultants:  Cardiology   Procedures:  none   Antibiotics - none   HPI/Subjective: Feeling well this morning, denies dizziness.  Objective: Filed Vitals:   06/03/13 2315 06/04/13 0015 06/04/13 0100 06/04/13 0441  BP: 147/58 118/65 167/76 148/69  Pulse: 61 66 65 57  Temp:   97.5 F (36.4 C) 97.8 F (36.6 C)  TempSrc:   Oral Oral  Resp: 14 20 20 18   Height:   6\' 2"  (1.88 m)   Weight:   98.8 kg (217 lb 13 oz)   SpO2: 97% 99% 98% 100%    Intake/Output Summary (Last 24 hours) at 06/04/13 0801 Last data filed at 06/04/13 0300  Gross per 24 hour  Intake      0 ml  Output    200 ml  Net   -200 ml   Filed Weights   06/03/13 1546 06/04/13 0100  Weight: 97.523 kg (215 lb) 98.8 kg (217 lb 13 oz)    Exam:   General:  NAD  Cardiovascular: regular rate and rhythm, without  MRG  Respiratory: good air movement, clear to auscultation throughout, no wheezing, ronchi or rales  Abdomen: soft, not tender to palpation, positive bowel sounds  MSK: no peripheral edema  Neuro: non focal  Data Reviewed: Basic Metabolic Panel:  Recent Labs Lab 06/03/13 1554  NA 139  K 4.9  CL 104  CO2 23  GLUCOSE 326*  BUN 23  CREATININE 1.27  CALCIUM 9.1   CBC:  Recent Labs Lab 06/03/13 1554  WBC 9.5  HGB 16.4  HCT 44.3  MCV 91.0  PLT 115*   Cardiac Enzymes:  Recent Labs Lab 06/04/13 0016 06/04/13 0620  TROPONINI <0.30 <0.30   CBG:  Recent Labs Lab 06/03/13 2306 06/04/13 0009 06/04/13 0428  GLUCAP 283* 315* 223*   Studies: Ct Head Wo Contrast  06/03/2013   CLINICAL DATA:  Dizziness.  EXAM: CT HEAD WITHOUT CONTRAST  TECHNIQUE: Contiguous axial images were obtained from the base of the skull through the vertex without intravenous contrast.  COMPARISON:  None.  FINDINGS: No intracranial hemorrhage.  No CT evidence of large acute infarct.  No intracranial mass lesion noted on this unenhanced exam.  No hydrocephalus.  Vascular calcifications.  IMPRESSION: No intracranial hemorrhage or CT evidence of large acute infarct.  Vascular calcifications.   Electronically Signed   By: Chauncey Cruel  M.D.   On: 06/03/2013 17:02    Scheduled Meds: . aspirin  162 mg Oral Daily  . carvedilol  12.5 mg Oral BID WC  . finasteride  5 mg Oral Daily  . heparin  5,000 Units Subcutaneous 3 times per day  . insulin aspart  0-15 Units Subcutaneous 6 times per day  . insulin glargine  10 Units Subcutaneous QHS  . oxybutynin  5 mg Oral Daily  . pantoprazole  40 mg Oral Daily  . ramipril  10 mg Oral Daily  . vitamin B-12  1,000 mcg Oral Daily   Continuous Infusions:   Principal Problem:   Shortness of breath Active Problems:   CAD   Type II or unspecified type diabetes mellitus without mention of complication, not stated as uncontrolled   CAD (coronary artery  disease)   Time spent: 25  This note has been created with Surveyor, quantity. Any transcriptional errors are unintentional.   Marzetta Board, MD [facility] Pager [phone]. If 7 PM - 7 AM, please contact night-coverage at [email or web address], password [facility] 06/04/2013, 8:01 AM  LOS: 1 day

## 2013-06-04 NOTE — Progress Notes (Signed)
SUBJECTIVE:  No chest pain.  No SOB    PHYSICAL EXAM Filed Vitals:   06/04/13 0015 06/04/13 0100 06/04/13 0441 06/04/13 0815  BP: 118/65 167/76 148/69 167/65  Pulse: 66 65 57 60  Temp:  97.5 F (36.4 C) 97.8 F (36.6 C) 97.9 F (36.6 C)  TempSrc:  Oral Oral Oral  Resp: 20 20 18 18   Height:  6\' 2"  (1.88 m)    Weight:  217 lb 13 oz (98.8 kg)    SpO2: 99% 98% 100% 100%   General:  No distress Lungs:  Clear Heart:  RRR Abdomen:  Positive bowel sounds, no rebound no guarding Extremities:  No edema   LABS: Lab Results  Component Value Date   TROPONINI <0.30 06/04/2013   Results for orders placed during the hospital encounter of 06/03/13 (from the past 24 hour(s))  CBC     Status: Abnormal   Collection Time    06/03/13  3:54 PM      Result Value Ref Range   WBC 9.5  4.0 - 10.5 K/uL   RBC 4.87  4.22 - 5.81 MIL/uL   Hemoglobin 16.4  13.0 - 17.0 g/dL   HCT 44.3  39.0 - 52.0 %   MCV 91.0  78.0 - 100.0 fL   MCH 33.7  26.0 - 34.0 pg   MCHC 37.0 (*) 30.0 - 36.0 g/dL   RDW 12.7  11.5 - 15.5 %   Platelets 115 (*) 150 - 400 K/uL  BASIC METABOLIC PANEL     Status: Abnormal   Collection Time    06/03/13  3:54 PM      Result Value Ref Range   Sodium 139  137 - 147 mEq/L   Potassium 4.9  3.7 - 5.3 mEq/L   Chloride 104  96 - 112 mEq/L   CO2 23  19 - 32 mEq/L   Glucose, Bld 326 (*) 70 - 99 mg/dL   BUN 23  6 - 23 mg/dL   Creatinine, Ser 1.27  0.50 - 1.35 mg/dL   Calcium 9.1  8.4 - 10.5 mg/dL   GFR calc non Af Amer 53 (*) >90 mL/min   GFR calc Af Amer 61 (*) >90 mL/min  I-STAT TROPOININ, ED     Status: None   Collection Time    06/03/13  4:20 PM      Result Value Ref Range   Troponin i, poc 0.00  0.00 - 0.08 ng/mL   Comment 3           URINALYSIS, ROUTINE W REFLEX MICROSCOPIC     Status: Abnormal   Collection Time    06/03/13 10:18 PM      Result Value Ref Range   Color, Urine YELLOW  YELLOW   APPearance CLEAR  CLEAR   Specific Gravity, Urine 1.027  1.005 - 1.030   pH  5.0  5.0 - 8.0   Glucose, UA >1000 (*) NEGATIVE mg/dL   Hgb urine dipstick NEGATIVE  NEGATIVE   Bilirubin Urine NEGATIVE  NEGATIVE   Ketones, ur NEGATIVE  NEGATIVE mg/dL   Protein, ur NEGATIVE  NEGATIVE mg/dL   Urobilinogen, UA 0.2  0.0 - 1.0 mg/dL   Nitrite NEGATIVE  NEGATIVE   Leukocytes, UA NEGATIVE  NEGATIVE  URINE MICROSCOPIC-ADD ON     Status: Abnormal   Collection Time    06/03/13 10:18 PM      Result Value Ref Range   Squamous Epithelial / LPF RARE  RARE  WBC, UA 0-2  <3 WBC/hpf   RBC / HPF 0-2  <3 RBC/hpf   Bacteria, UA RARE  RARE   Casts HYALINE CASTS (*) NEGATIVE  CBG MONITORING, ED     Status: Abnormal   Collection Time    06/03/13 11:06 PM      Result Value Ref Range   Glucose-Capillary 283 (*) 70 - 99 mg/dL  TSH     Status: None   Collection Time    06/03/13 11:20 PM      Result Value Ref Range   TSH 2.080    CBG MONITORING, ED     Status: Abnormal   Collection Time    06/04/13 12:09 AM      Result Value Ref Range   Glucose-Capillary 315 (*) 70 - 99 mg/dL  TROPONIN I     Status: None   Collection Time    06/04/13 12:16 AM      Result Value Ref Range   Troponin I <0.30  <0.30 ng/mL  GLUCOSE, CAPILLARY     Status: Abnormal   Collection Time    06/04/13  4:28 AM      Result Value Ref Range   Glucose-Capillary 223 (*) 70 - 99 mg/dL  TROPONIN I     Status: None   Collection Time    06/04/13  6:20 AM      Result Value Ref Range   Troponin I <0.30  <0.30 ng/mL  GLUCOSE, CAPILLARY     Status: Abnormal   Collection Time    06/04/13  8:14 AM      Result Value Ref Range   Glucose-Capillary 164 (*) 70 - 99 mg/dL   Comment 1 Notify RN     Comment 2 Documented in Chart      Intake/Output Summary (Last 24 hours) at 06/04/13 7673 Last data filed at 06/04/13 0300  Gross per 24 hour  Intake      0 ml  Output    200 ml  Net   -200 ml    TELEMETRY:  No arrhythmias overnight.    ASSESSMENT AND PLAN:  DIZZINESS:  Plan per primary team.  No evidence of a  dysrhythmia or other cardiac etiology.   CAD:  Enzymes negative.   However, his dyspnea has progressed over time.  The plan was for right and left cath to work this up given his previous history.  I will start with an echocardiogram this morning.  Plan for cath in AM.      Minus Breeding 06/04/2013 8:22 AM

## 2013-06-04 NOTE — Progress Notes (Signed)
UR Completed.  [name] [name] [phone] 06/04/2013  

## 2013-06-04 NOTE — Consult Note (Signed)
Reason for Consult: dizziness, presyncope Referring Physician: Dr. Alcario [name] Primary Cardiologist: Dr. Gar [name] [name] [name] is an 80 y.o. male.  HPI: [name] [name] is a 80 yo man with CAD s/p CABG x4, dyslipidemia, hypertension who has had vertigo and weakness dating back to Thursday ~ 3-4 days ago that he treated with antivert and valium with some improvement of vertigo but continued weakness and dizziness leading to presentation. He is not more clear in describing his dizziness. He says that he has associated SOB that is more than typical during this same time period that is concerning and similar to when he required bypass surgery. Even small walking tasks around the home produces significant SOB. He follows with Dr. Rockey Situ who hsa stated that he would seek LHC should [name] [name] have progressive SOB in previous visits. [name] [name] was going to see Dr. Hedy Camara in a few weeks for routine follow-up. Otherwise, no new fever/chills/diarrhea. No other sick contacts or changes. He last had a cardiac examination in 2009 with CT revealing patent SVG to PDA, SVG to OM, SVG to diagonal and a LIMA to LAD patent.     Past Medical History  Diagnosis Date  . Coronary artery disease   . Hyperlipidemia   . Hypertension   . Type II or unspecified type diabetes mellitus without mention of complication, not stated as uncontrolled 2000  . Hypertrophy of prostate without urinary obstruction and other lower urinary tract symptoms (LUTS)   . GERD (gastroesophageal reflux disease)   . Perirectal fistula   . Cataract     Past Surgical History  Procedure Laterality Date  . Coronary artery bypass graft  2007    x 4  . Foot surgery  2012    right foot  . Lumbar laminectomy  1989  . Coronary angioplasty with stent placement      before CABG  . Joint replacement  8/10    Right THR--[name]  . Colonoscopy w/ polypectomy    . Cholecystectomy  05/15/2011    Procedure: LAPAROSCOPIC CHOLECYSTECTOMY;  Surgeon:  Rolm Bookbinder, MD;  Location: [facility];  Service: General;  Laterality: N/A;  . Cataract extraction  sept 2013    right    Family History  Problem Relation Age of Onset  . Lung cancer Mother   . Heart disease Father   . Colon cancer Neg Hx     Social History:  reports that he has never smoked. He has never used smokeless tobacco. He reports that he drinks alcohol. He reports that he does not use illicit drugs.  Allergies: No Known Allergies  Medications:  I have reviewed the patient's current medications. Prior to Admission:  (Not in a hospital admission) Scheduled: . aspirin  162 mg Oral Daily  . carvedilol  12.5 mg Oral BID WC  . finasteride  5 mg Oral Daily  . heparin  5,000 Units Subcutaneous 3 times per day  . insulin aspart  0-15 Units Subcutaneous 6 times per day  . insulin glargine  10 Units Subcutaneous QHS  . oxybutynin  5 mg Oral Daily  . pantoprazole  40 mg Oral Daily  . ramipril  10 mg Oral Daily  . vitamin B-12  1,000 mcg Oral Daily    Results for orders placed during the hospital encounter of 06/03/13 (from the past 48 hour(s))  CBC     Status: Abnormal   Collection Time    06/03/13  3:54 PM      Result Value Ref Range  WBC 9.5  4.0 - 10.5 K/uL   RBC 4.87  4.22 - 5.81 MIL/uL   Hemoglobin 16.4  13.0 - 17.0 g/dL   HCT 44.3  39.0 - 52.0 %   MCV 91.0  78.0 - 100.0 fL   MCH 33.7  26.0 - 34.0 pg   MCHC 37.0 (*) 30.0 - 36.0 g/dL   RDW 12.7  11.5 - 15.5 %   Platelets 115 (*) 150 - 400 K/uL   Comment: PLATELET COUNT CONFIRMED BY SMEAR  BASIC METABOLIC PANEL     Status: Abnormal   Collection Time    06/03/13  3:54 PM      Result Value Ref Range   Sodium 139  137 - 147 mEq/L   Potassium 4.9  3.7 - 5.3 mEq/L   Chloride 104  96 - 112 mEq/L   CO2 23  19 - 32 mEq/L   Glucose, Bld 326 (*) 70 - 99 mg/dL   BUN 23  6 - 23 mg/dL   Creatinine, Ser 1.27  0.50 - 1.35 mg/dL   Calcium 9.1  8.4 - 10.5 mg/dL   GFR calc non Af Amer 53 (*) >90 mL/min   GFR calc Af  Amer 61 (*) >90 mL/min   Comment: (NOTE)     The eGFR has been calculated using the CKD EPI equation.     This calculation has not been validated in all clinical situations.     eGFR's persistently <90 mL/min signify possible Chronic Kidney     Disease.  [name] [name], ED     Status: None   Collection Time    06/03/13  4:20 PM      Result Value Ref Range   Troponin i, poc 0.00  0.00 - 0.08 ng/mL   Comment 3            Comment: Due to the release kinetics of cTnI,     a negative result within the first hours     of the onset of symptoms does not rule out     myocardial infarction with certainty.     If myocardial infarction is still suspected,     repeat the test at appropriate intervals.  URINALYSIS, ROUTINE W REFLEX MICROSCOPIC     Status: Abnormal   Collection Time    06/03/13 10:18 PM      Result Value Ref Range   Color, Urine YELLOW  YELLOW   APPearance CLEAR  CLEAR   Specific Gravity, Urine 1.027  1.005 - 1.030   pH 5.0  5.0 - 8.0   Glucose, UA >1000 (*) NEGATIVE mg/dL   Hgb urine dipstick NEGATIVE  NEGATIVE   Bilirubin Urine NEGATIVE  NEGATIVE   Ketones, ur NEGATIVE  NEGATIVE mg/dL   Protein, ur NEGATIVE  NEGATIVE mg/dL   Urobilinogen, UA 0.2  0.0 - 1.0 mg/dL   Nitrite NEGATIVE  NEGATIVE   Leukocytes, UA NEGATIVE  NEGATIVE  URINE MICROSCOPIC-ADD ON     Status: Abnormal   Collection Time    06/03/13 10:18 PM      Result Value Ref Range   Squamous Epithelial / LPF RARE  RARE   WBC, UA 0-2  <3 WBC/hpf   RBC / HPF 0-2  <3 RBC/hpf   Bacteria, UA RARE  RARE   Casts HYALINE CASTS (*) NEGATIVE  CBG MONITORING, ED     Status: Abnormal   Collection Time    06/03/13 11:06 PM      Result Value Ref Range  Glucose-Capillary 283 (*) 70 - 99 mg/dL  CBG MONITORING, ED     Status: Abnormal   Collection Time    06/04/13 12:09 AM      Result Value Ref Range   Glucose-Capillary 315 (*) 70 - 99 mg/dL    Ct Head Wo Contrast  06/03/2013   CLINICAL DATA:  Dizziness.  EXAM: CT  HEAD WITHOUT CONTRAST  TECHNIQUE: Contiguous axial images were obtained from the base of the skull through the vertex without intravenous contrast.  COMPARISON:  None.  FINDINGS: No intracranial hemorrhage.  No CT evidence of large acute infarct.  No intracranial mass lesion noted on this unenhanced exam.  No hydrocephalus.  Vascular calcifications.  IMPRESSION: No intracranial hemorrhage or CT evidence of large acute infarct.  Vascular calcifications.   Electronically Signed   By: Chauncey Cruel M.D.   On: 06/03/2013 17:02    Review of Systems  Constitutional: Positive for malaise/fatigue. Negative for fever and chills.  HENT: Positive for hearing loss. Negative for nosebleeds.   Eyes: Negative for blurred vision and photophobia.  Respiratory: Positive for shortness of breath.   Cardiovascular: Negative for chest pain and palpitations.  Gastrointestinal: Positive for heartburn and nausea. Negative for blood in stool.  Genitourinary: Negative for dysuria and frequency.  Musculoskeletal: Negative for myalgias and neck pain.  Neurological: Positive for dizziness. Negative for tremors, seizures and headaches.  Endo/Heme/Allergies: Negative for polydipsia. Bruises/bleeds easily.  Psychiatric/Behavioral: Negative for suicidal ideas, hallucinations and substance abuse.   Blood pressure 118/65, pulse 66, temperature 97.6 F (36.4 C), temperature source Oral, resp. rate 20, height 6' 2"  (1.88 m), weight 97.523 kg (215 lb), SpO2 99.00%. Physical Exam  Nursing note and vitals reviewed. Constitutional: He is oriented to person, place, and time. He appears well-developed and well-nourished. No distress.  HENT:  Head: Normocephalic and atraumatic.  Nose: Nose normal.  Mouth/Throat: Oropharynx is clear and moist. No oropharyngeal exudate.  Eyes: Conjunctivae and EOM are normal. Pupils are equal, round, and reactive to light. No scleral icterus.  Neck: Normal range of motion. Neck supple. No JVD present. No  thyromegaly present.  Cardiovascular: Normal rate, regular rhythm, normal heart sounds and intact distal pulses.  Exam reveals no gallop.   No murmur heard. Respiratory: Effort normal and breath sounds normal. No respiratory distress. He has no wheezes.  GI: Soft. Bowel sounds are normal. He exhibits no distension. There is no tenderness.  Musculoskeletal: Normal range of motion. He exhibits edema.  Trace ankle edema  Neurological: He is alert and oriented to person, place, and time. No cranial nerve deficit.  Skin: Skin is warm and dry. No rash noted. He is not diaphoretic. No erythema.  Psychiatric: He has a normal mood and affect. His behavior is normal. Thought content normal.  Labs reviewed; wbc 9.5, plt 115, h/h 16.4/44.3, na 139, K 4.9, bun/cr 23/1.27 Trop negative Urinalysis with > 1000 glucose CTH unrevealing EKG: NSR, IRBBB, QRS < 108 msec, HR 67  Problem List Dizziness Weakness Shortness of breath Known CAD with prior CABG Borderline thrombocytopenia (plt 115) T2DM Hypertension, Dyslipidemia   Assessment/Plan: Mr. Strojny is a 80 yo man with PMH of CAD s/p CABG with 3 SVG and LIMA to LAD, dyslipidemia, hypertension, T2DM who presents with dizziness and also problem of progressive shortness of breath. Differential for dizziness/pre-syncope is vertigo, vasovagal, hypovolemia, arrhythmia (VT, bradyarrhythmia), ischemia, neurocardiogenic among other etiologies. I favor a diagnosis of hypovolemia caused by less PO intake given significant valium intake and sleeping with  poorer PO intake. Differential for SOB is unstable angina/anginal equivalent, deconditioning, lung pathology among other etiologies. For now, appreciate internal medicine assistance. Would favor obtaining echocardiogram. Keep NPO so that we can consider LHC but nonischemic evaluation can also be strongly considered. Trend cardiac markers, monitor on telemetry overnight.  - telemetry, trend cardiac markers -  echocardiogram in AM - Keep NPO as we consider potential ischemic evaluation  - Please let us know if anything changes clinically - continue to evaluate for noncardiac causes of dizziness  Solimar Maiden 06/04/2013, 12:44 AM

## 2013-06-05 ENCOUNTER — Other Ambulatory Visit: Payer: Self-pay

## 2013-06-05 ENCOUNTER — Encounter (HOSPITAL_COMMUNITY)
Admission: EM | Disposition: A | Discharge status: Home / Self Care | Payer: Medicare Other | Source: Home / Self Care | Attending: Internal Medicine

## 2013-06-05 DIAGNOSIS — I2510 Atherosclerotic heart disease of native coronary artery without angina pectoris: Secondary | ICD-10-CM

## 2013-06-05 HISTORY — PX: CARDIAC CATHETERIZATION: SHX172

## 2013-06-05 HISTORY — PX: LEFT AND RIGHT HEART CATHETERIZATION WITH CORONARY/GRAFT ANGIOGRAM: SHX5448

## 2013-06-05 LAB — GLUCOSE, CAPILLARY
GLUCOSE-CAPILLARY: 165 mg/dL — AB (ref 70–99)
GLUCOSE-CAPILLARY: 134 mg/dL — AB (ref 70–99)
GLUCOSE-CAPILLARY: 158 mg/dL — AB (ref 70–99)
GLUCOSE-CAPILLARY: 97 mg/dL (ref 70–99)
GLUCOSE-CAPILLARY: 238 mg/dL — AB (ref 70–99)
Glucose-Capillary: 101 mg/dL — ABNORMAL HIGH (ref 70–99)
Glucose-Capillary: 167 mg/dL — ABNORMAL HIGH (ref 70–99)

## 2013-06-05 LAB — POCT I-STAT 3, VENOUS BLOOD GAS (G3P V)
BICARBONATE: 25.4 meq/L — AB (ref 20.0–24.0)
O2 Saturation: 63 %
TCO2: 27 mmol/L (ref 0–100)
pCO2, Ven: 43 mmHg — ABNORMAL LOW (ref 45.0–50.0)
pH, Ven: 7.379 — ABNORMAL HIGH (ref 7.250–7.300)
pO2, Ven: 34 mmHg (ref 30.0–45.0)

## 2013-06-05 LAB — BASIC METABOLIC PANEL
BUN: 24 mg/dL — AB (ref 6–23)
CO2: 22 meq/L (ref 19–32)
Calcium: 9 mg/dL (ref 8.4–10.5)
Chloride: 102 mEq/L (ref 96–112)
Creatinine, Ser: 1.22 mg/dL (ref 0.50–1.35)
GFR calc Af Amer: 64 mL/min — ABNORMAL LOW (ref >90)
GFR, EST NON AFRICAN AMERICAN: 55 mL/min — AB (ref >90)
GLUCOSE: 166 mg/dL — AB (ref 70–99)
Potassium: 4.3 mEq/L (ref 3.7–5.3)
Sodium: 138 mEq/L (ref 137–147)

## 2013-06-05 LAB — PROTIME-INR
INR: 1.01 (ref 0.00–1.49)
Prothrombin Time: 13.1 seconds (ref 11.6–15.2)

## 2013-06-05 LAB — CBC
HEMATOCRIT: 42.9 % (ref 39.0–52.0)
HEMOGLOBIN: 15.7 g/dL (ref 13.0–17.0)
MCH: 33.5 pg (ref 26.0–34.0)
MCHC: 36.6 g/dL — ABNORMAL HIGH (ref 30.0–36.0)
MCV: 91.7 fL (ref 78.0–100.0)
Platelets: 105 10*3/uL — ABNORMAL LOW (ref 150–400)
RBC: 4.68 MIL/uL (ref 4.22–5.81)
RDW: 12.7 % (ref 11.5–15.5)
WBC: 9.5 10*3/uL (ref 4.0–10.5)

## 2013-06-05 LAB — POCT I-STAT 3, ART BLOOD GAS (G3+)
Bicarbonate: 24.1 mEq/L — ABNORMAL HIGH (ref 20.0–24.0)
O2 Saturation: 94 %
TCO2: 25 mmol/L (ref 0–100)
pCO2 arterial: 38.5 mmHg (ref 35.0–45.0)
pH, Arterial: 7.405 (ref 7.350–7.450)
pO2, Arterial: 69 mmHg — ABNORMAL LOW (ref 80.0–100.0)

## 2013-06-05 SURGERY — LEFT AND RIGHT HEART CATHETERIZATION WITH CORONARY/GRAFT ANGIOGRAM
Anesthesia: LOCAL

## 2013-06-05 MED ORDER — LIDOCAINE HCL (PF) 1 % IJ SOLN
INTRAMUSCULAR | Status: AC
  Filled 2013-06-05: qty 30

## 2013-06-05 MED ORDER — HEPARIN (PORCINE) IN NACL 2-0.9 UNIT/ML-% IJ SOLN
INTRAMUSCULAR | Status: AC
  Filled 2013-06-05: qty 1500

## 2013-06-05 MED ORDER — INSULIN ASPART 100 UNIT/ML ~~LOC~~ SOLN
0.0000 [IU] | Freq: Three times a day (TID) | SUBCUTANEOUS | Status: DC
  Administered 2013-06-06: 8 [IU] via SUBCUTANEOUS
  Administered 2013-06-06: 3 [IU] via SUBCUTANEOUS

## 2013-06-05 MED ORDER — MIDAZOLAM HCL 2 MG/2ML IJ SOLN
INTRAMUSCULAR | Status: AC
  Filled 2013-06-05: qty 2

## 2013-06-05 MED ORDER — HEPARIN SODIUM (PORCINE) 1000 UNIT/ML IJ SOLN
INTRAMUSCULAR | Status: AC
  Filled 2013-06-05: qty 1

## 2013-06-05 MED ORDER — HEPARIN SODIUM (PORCINE) 5000 UNIT/ML IJ SOLN
5000.0000 [IU] | Freq: Three times a day (TID) | INTRAMUSCULAR | Status: DC
  Administered 2013-06-05 – 2013-06-06 (×2): 5000 [IU] via SUBCUTANEOUS
  Filled 2013-06-05 (×5): qty 1

## 2013-06-05 MED ORDER — SODIUM CHLORIDE 0.9 % IV SOLN: 1.0000 mL/kg/h | INTRAVENOUS | Status: AC

## 2013-06-05 MED ORDER — NITROGLYCERIN 0.2 MG/ML ON CALL CATH LAB
INTRAVENOUS | Status: AC
  Filled 2013-06-05: qty 1

## 2013-06-05 MED ORDER — FENTANYL CITRATE 0.05 MG/ML IJ SOLN
INTRAMUSCULAR | Status: AC
Start: 2013-06-05 — End: 2013-06-05
  Filled 2013-06-05: qty 2

## 2013-06-05 MED ORDER — VERAPAMIL HCL 2.5 MG/ML IV SOLN
INTRAVENOUS | Status: AC
  Filled 2013-06-05: qty 2

## 2013-06-05 NOTE — CV Procedure (Signed)
   Cardiac Catheterization Procedure Note  Name: [name] [name] MRN: [id number] DOB: 09-Oct-1933  Procedure: Right Heart Cath, Left Heart Cath, Selective Coronary Angiography, SVG angiography, LIMA angiography, LV angiography  Indication: 80 yo WM with history of CAD s/p CABG presents with symptoms of progressive dyspnea.    Procedural Details: The left wrist was prepped, draped, and anesthetized with 1% lidocaine. Using the modified Seldinger technique a 5 French sheath was placed in the left radial artery and a 5 French sheath was placed in the left brachial vein. A Swan-Ganz catheter was used for the right heart catheterization. Standard protocol was followed for recording of right heart pressures and sampling of oxygen saturations. Fick cardiac output was calculated. Standard Judkins catheters were used for selective coronary angiography and left ventriculography. The SVGs to the OM and diagonal were engaged with a LCB catheter. The LIMA was engaged with a 90 degree Bernstein catheter. There were no immediate procedural complications. The patient was transferred to the post catheterization recovery area for further monitoring.  Procedural Findings: Hemodynamics RA 4/4 mean 2 mm Hg RV 24/3 mm Hg PA 22/8 mean 14 mm Hg PCWP 9/6 mean 5 mm Hg LV 143/10 mm Hg AO 142/58 mean 89 mm Hg  Oxygen saturations: PA 63% AO 94%  Cardiac Output (Fick) 4.54 L/min  Cardiac Index (Fick) 2.01 L/min/m2   Coronary angiography: Coronary dominance: right  Left mainstem: Normal  Left anterior descending (LAD):  100% occluded proximally   Left circumflex (LCx): There is mild disease in the proximal vessel less than 30%. The mid vessel is ectatic. There are three OM branches supplying the lateral wall. The third OM has a long 70-80% stenosis.   Right coronary artery (RCA): The RCA is diffusely diseased up to 90% from the ostium through the proximal vessel. There is 50% disease in the mid vessel.   SVG  to the distal RCA is widely patent with good runoff into the PDA and PLOM branches.   SVG to the diagonal is widely patent. There is mild narrowing in the distal SVG less than 30%. This may represent an area of a valve.   SVG to the third OM is widely patent.   LIMA to the LAD is widely patent. The LAD after the graft insertion is very small, diffusely and severely diseased throughout.   Left ventriculography: Left ventricular systolic function is normal, LVEF is estimated at 55-65%, there is no significant mitral regurgitation   Final Conclusions:   1. Severe 3 vessel obstructive CAD 2. All grafts are patent including LIMA to the LAD, SVG to the diagonal, SVG to the OM3, and SVG to the distal RCA 3. Normal LV function. 4. Normal right heart and LV filling pressures.   Recommendations: The only potential area of ischemia is the distal LAD territory which is very small and diffusely diseased. Recommend continued medical management.    Collier [name] 06/05/2013, 3:19 PM

## 2013-06-05 NOTE — H&P (View-Only) (Signed)
SUBJECTIVE:  Denies CP and SOB.    PHYSICAL EXAM Filed Vitals:   06/04/13 1313 06/04/13 2019 06/05/13 0427 06/05/13 0523  BP: 124/66 157/83 130/68   Pulse: 60 74 65   Temp: 97 F (36.1 C) 97.8 F (36.6 C) 98.1 F (36.7 C)   TempSrc: Oral Oral Oral   Resp: 18 18 18    Height:    6\' 2"  (1.88 m)  Weight:    216 lb 0.8 oz (98 kg)  SpO2: 100% 96% 96%    General:  No distress Lungs:  Clear Heart:  RRR Abdomen:  Positive bowel sounds, no rebound no guarding Extremities:  No edema   LABS: Lab Results  Component Value Date   TROPONINI <0.30 06/04/2013   Results for orders placed during the hospital encounter of 06/03/13 (from the past 24 hour(s))  GLUCOSE, CAPILLARY     Status: Abnormal   Collection Time    06/04/13 11:12 AM      Result Value Ref Range   Glucose-Capillary 128 (*) 70 - 99 mg/dL   Comment 1 Notify RN     Comment 2 Documented in Chart    TROPONIN I     Status: None   Collection Time    06/04/13 12:10 PM      Result Value Ref Range   Troponin I <0.30  <0.30 ng/mL  HEMOGLOBIN A1C     Status: Abnormal   Collection Time    06/04/13  2:46 PM      Result Value Ref Range   Hemoglobin A1C 7.0 (*) <5.7 %   Mean Plasma Glucose 154 (*) <117 mg/dL  GLUCOSE, CAPILLARY     Status: Abnormal   Collection Time    06/04/13  4:01 PM      Result Value Ref Range   Glucose-Capillary 219 (*) 70 - 99 mg/dL  GLUCOSE, CAPILLARY     Status: Abnormal   Collection Time    06/04/13  9:04 PM      Result Value Ref Range   Glucose-Capillary 293 (*) 70 - 99 mg/dL  GLUCOSE, CAPILLARY     Status: Abnormal   Collection Time    06/05/13 12:00 AM      Result Value Ref Range   Glucose-Capillary 167 (*) 70 - 99 mg/dL  CBC     Status: Abnormal   Collection Time    06/05/13  3:53 AM      Result Value Ref Range   WBC 9.5  4.0 - 10.5 K/uL   RBC 4.68  4.22 - 5.81 MIL/uL   Hemoglobin 15.7  13.0 - 17.0 g/dL   HCT 42.9  39.0 - 52.0 %   MCV 91.7  78.0 - 100.0 fL   MCH 33.5  26.0 - 34.0  pg   MCHC 36.6 (*) 30.0 - 36.0 g/dL   RDW 12.7  11.5 - 15.5 %   Platelets 105 (*) 150 - 400 K/uL  BASIC METABOLIC PANEL     Status: Abnormal   Collection Time    06/05/13  3:53 AM      Result Value Ref Range   Sodium 138  137 - 147 mEq/L   Potassium 4.3  3.7 - 5.3 mEq/L   Chloride 102  96 - 112 mEq/L   CO2 22  19 - 32 mEq/L   Glucose, Bld 166 (*) 70 - 99 mg/dL   BUN 24 (*) 6 - 23 mg/dL   Creatinine, Ser 1.22  0.50 - 1.35 mg/dL  Calcium 9.0  8.4 - 10.5 mg/dL   GFR calc non Af Amer 55 (*) >90 mL/min   GFR calc Af Amer 64 (*) >90 mL/min  PROTIME-INR     Status: None   Collection Time    06/05/13  3:53 AM      Result Value Ref Range   Prothrombin Time 13.1  11.6 - 15.2 seconds   INR 1.01  0.00 - 1.49  GLUCOSE, CAPILLARY     Status: Abnormal   Collection Time    06/05/13  4:36 AM      Result Value Ref Range   Glucose-Capillary 165 (*) 70 - 99 mg/dL  GLUCOSE, CAPILLARY     Status: Abnormal   Collection Time    06/05/13  8:07 AM      Result Value Ref Range   Glucose-Capillary 134 (*) 70 - 99 mg/dL    Intake/Output Summary (Last 24 hours) at 06/05/13 0915 Last data filed at 06/05/13 0427  Gross per 24 hour  Intake  672.5 ml  Output   2275 ml  Net -1602.5 ml    TELEMETRY:  ? NSVT on telemetry   ASSESSMENT AND PLAN:  DIZZINESS:  Telemetry shows questionable VT.  However, this was clearly artifact.  No significant dysrhythmias.      CAD:  Enzymes negative.   However, his dyspnea has progressed over time.  Plan is for [facility] today with Dr. Martinique. Renal function and INR both WNL.     [name] [name], BRITTAINY 06/05/2013 9:15 AM  History and all data above reviewed.  Patient examined.  I agree with the findings as above.   The patient exam reveals COR:RRR  ,  Lungs: Clear  ,  Abd: Positive bowel sounds, no rebound no guarding, Ext No edema  .  All available labs, radiology testing, previous records reviewed. Agree with documented assessment and plan. Dizziness is likely related to  meds.  No arrhythmias.  Dyspnea:  Right and left cath today.   Jeneen Rinks Kemiah Booz  11:50 AM  06/05/2013

## 2013-06-05 NOTE — Progress Notes (Signed)
SUBJECTIVE:  Denies CP and SOB.    PHYSICAL EXAM Filed Vitals:   06/04/13 1313 06/04/13 2019 06/05/13 0427 06/05/13 0523  BP: 124/66 157/83 130/68   Pulse: 60 74 65   Temp: 97 F (36.1 C) 97.8 F (36.6 C) 98.1 F (36.7 C)   TempSrc: Oral Oral Oral   Resp: 18 18 18    Height:    6\' 2"  (1.88 m)  Weight:    216 lb 0.8 oz (98 kg)  SpO2: 100% 96% 96%    General:  No distress Lungs:  Clear Heart:  RRR Abdomen:  Positive bowel sounds, no rebound no guarding Extremities:  No edema   LABS: Lab Results  Component Value Date   TROPONINI <0.30 06/04/2013   Results for orders placed during the hospital encounter of 06/03/13 (from the past 24 hour(s))  GLUCOSE, CAPILLARY     Status: Abnormal   Collection Time    06/04/13 11:12 AM      Result Value Ref Range   Glucose-Capillary 128 (*) 70 - 99 mg/dL   Comment 1 Notify RN     Comment 2 Documented in Chart    TROPONIN I     Status: None   Collection Time    06/04/13 12:10 PM      Result Value Ref Range   Troponin I <0.30  <0.30 ng/mL  HEMOGLOBIN A1C     Status: Abnormal   Collection Time    06/04/13  2:46 PM      Result Value Ref Range   Hemoglobin A1C 7.0 (*) <5.7 %   Mean Plasma Glucose 154 (*) <117 mg/dL  GLUCOSE, CAPILLARY     Status: Abnormal   Collection Time    06/04/13  4:01 PM      Result Value Ref Range   Glucose-Capillary 219 (*) 70 - 99 mg/dL  GLUCOSE, CAPILLARY     Status: Abnormal   Collection Time    06/04/13  9:04 PM      Result Value Ref Range   Glucose-Capillary 293 (*) 70 - 99 mg/dL  GLUCOSE, CAPILLARY     Status: Abnormal   Collection Time    06/05/13 12:00 AM      Result Value Ref Range   Glucose-Capillary 167 (*) 70 - 99 mg/dL  CBC     Status: Abnormal   Collection Time    06/05/13  3:53 AM      Result Value Ref Range   WBC 9.5  4.0 - 10.5 K/uL   RBC 4.68  4.22 - 5.81 MIL/uL   Hemoglobin 15.7  13.0 - 17.0 g/dL   HCT 42.9  39.0 - 52.0 %   MCV 91.7  78.0 - 100.0 fL   MCH 33.5  26.0 - 34.0  pg   MCHC 36.6 (*) 30.0 - 36.0 g/dL   RDW 12.7  11.5 - 15.5 %   Platelets 105 (*) 150 - 400 K/uL  BASIC METABOLIC PANEL     Status: Abnormal   Collection Time    06/05/13  3:53 AM      Result Value Ref Range   Sodium 138  137 - 147 mEq/L   Potassium 4.3  3.7 - 5.3 mEq/L   Chloride 102  96 - 112 mEq/L   CO2 22  19 - 32 mEq/L   Glucose, Bld 166 (*) 70 - 99 mg/dL   BUN 24 (*) 6 - 23 mg/dL   Creatinine, Ser 1.22  0.50 - 1.35 mg/dL  Calcium 9.0  8.4 - 10.5 mg/dL   GFR calc non Af Amer 55 (*) >90 mL/min   GFR calc Af Amer 64 (*) >90 mL/min  PROTIME-INR     Status: None   Collection Time    06/05/13  3:53 AM      Result Value Ref Range   Prothrombin Time 13.1  11.6 - 15.2 seconds   INR 1.01  0.00 - 1.49  GLUCOSE, CAPILLARY     Status: Abnormal   Collection Time    06/05/13  4:36 AM      Result Value Ref Range   Glucose-Capillary 165 (*) 70 - 99 mg/dL  GLUCOSE, CAPILLARY     Status: Abnormal   Collection Time    06/05/13  8:07 AM      Result Value Ref Range   Glucose-Capillary 134 (*) 70 - 99 mg/dL    Intake/Output Summary (Last 24 hours) at 06/05/13 0915 Last data filed at 06/05/13 0427  Gross per 24 hour  Intake  672.5 ml  Output   2275 ml  Net -1602.5 ml    TELEMETRY:  ? NSVT on telemetry   ASSESSMENT AND PLAN:  DIZZINESS:  Telemetry shows questionable VT.  However, this was clearly artifact.  No significant dysrhythmias.      CAD:  Enzymes negative.   However, his dyspnea has progressed over time.  Plan is for [facility] today with Dr. Martinique. Renal function and INR both WNL.     [name], [name] 06/05/2013 9:15 AM  History and all data above reviewed.  Patient examined.  I agree with the findings as above.   The patient exam reveals COR:RRR  ,  Lungs: Clear  ,  Abd: Positive bowel sounds, no rebound no guarding, Ext No edema  .  All available labs, radiology testing, previous records reviewed. Agree with documented assessment and plan. Dizziness is likely related to  meds.  No arrhythmias.  Dyspnea:  Right and left cath today.   Jeneen Rinks Blanca Thornton  11:50 AM  06/05/2013

## 2013-06-05 NOTE — Interval H&P Note (Signed)
History and Physical Interval Note:  06/05/2013 1:39 PM  [name] [name]  has presented today for surgery, with the diagnosis of cp  The various methods of treatment have been discussed with the patient and family. After consideration of risks, benefits and other options for treatment, the patient has consented to  Procedure(s): LEFT AND RIGHT HEART CATHETERIZATION WITH CORONARY/GRAFT ANGIOGRAM (N/A) as a surgical intervention .  The patient's history has been reviewed, patient examined, no change in status, stable for surgery.  I have reviewed the patient's chart and labs.  Questions were answered to the patient's satisfaction.   Cath Lab Visit (complete for each Cath Lab visit)  Clinical Evaluation Leading to the Procedure:   ACS: yes  Non-ACS:    Anginal Classification: CCS III  Anti-ischemic medical therapy: Maximal Therapy (2 or more classes of medications)  Non-Invasive Test Results: No non-invasive testing performed  Prior CABG: Previous CABG        Collier Salina [facility] 06/05/2013 1:39 PM

## 2013-06-05 NOTE — Progress Notes (Signed)
PROGRESS NOTE  RAEL YO YBO:[id number] DOB: Oct 13, 1933 DOA: 06/03/2013 PCP: Viviana Simpler, MD  Assessment/Plan: Dyspnea on exertion -likely related to deconditioning vs lung pathology -06/05/2013 heart catheterization--severe 3x native vessel dz, patent grafts, normal LV function -outpt PFTs -Troponins negative x3- Dizziness -Likely medication related vs dysautonomia from DM -Patient currently in a clinical trial for long-acting insulin -Patient relates intermittent hypoglycemia related to dizziness -Carotid ultrasound -Outpatient anti-followup -Discontinue benzodiazepine the patient was taking before admission Diabetes mellitus type 2 -Hemoglobin A1c 7.0 -Patient to continue on his clinical trial at [facility] after discharge HTN/CAD -Continue carvedilol, Altace -Continue aspirin Thrombocytopenia -Chronic -No active bleeding -Hemoglobin stable -outpt followup Family Communication:   Wife at beside Disposition Plan:   Home when medically stable      Procedures/Studies: Ct Head Wo Contrast  06/03/2013   CLINICAL DATA:  Dizziness.  EXAM: CT HEAD WITHOUT CONTRAST  TECHNIQUE: Contiguous axial images were obtained from the base of the skull through the vertex without intravenous contrast.  COMPARISON:  None.  FINDINGS: No intracranial hemorrhage.  No CT evidence of large acute infarct.  No intracranial mass lesion noted on this unenhanced exam.  No hydrocephalus.  Vascular calcifications.  IMPRESSION: No intracranial hemorrhage or CT evidence of large acute infarct.  Vascular calcifications.   Electronically Signed   By: Chauncey Cruel M.D.   On: 06/03/2013 17:02         Subjective: Patient is breathing better. He denies any chest discomfort, dizziness, nausea, vomiting, diarrhea, abdominal pain, dysuria, hematuria.  Objective: Filed Vitals:   06/05/13 1700 06/05/13 1715 06/05/13 1730 06/05/13 1745  BP: 156/63 157/68 155/68 147/80  Pulse: 59 62 64 60   Temp:      TempSrc:      Resp:      Height:      Weight:      SpO2: 100% 99% 99% 98%    Intake/Output Summary (Last 24 hours) at 06/05/13 1905 Last data filed at 06/05/13 1852  Gross per 24 hour  Intake    240 ml  Output   1250 ml  Net  -1010 ml   Weight change: 0.477 kg (1 lb 0.8 oz) Exam:   General:  Pt is alert, follows commands appropriately, not in acute distress  HEENT: No icterus, No thrush, [place]/AT  Cardiovascular: RRR, S1/S2, no rubs, no gallops  Respiratory: CTA bilaterally, no wheezing, no crackles, no rhonchi  Abdomen: Soft/+BS, non tender, non distended, no guarding  Extremities: No edema, No lymphangitis, No petechiae, No rashes, no synovitis  Data Reviewed: Basic Metabolic Panel:  Recent Labs Lab 06/03/13 1554 06/05/13 0353  NA 139 138  K 4.9 4.3  CL 104 102  CO2 23 22  GLUCOSE 326* 166*  BUN 23 24*  CREATININE 1.27 1.22  CALCIUM 9.1 9.0   Liver Function Tests: No results found for this basename: AST, ALT, ALKPHOS, BILITOT, PROT, ALBUMIN,  in the last 168 hours No results found for this basename: LIPASE, AMYLASE,  in the last 168 hours No results found for this basename: AMMONIA,  in the last 168 hours CBC:  Recent Labs Lab 06/03/13 1554 06/05/13 0353  WBC 9.5 9.5  HGB 16.4 15.7  HCT 44.3 42.9  MCV 91.0 91.7  PLT 115* 105*   Cardiac Enzymes:  Recent Labs Lab 06/04/13 0016 06/04/13 0620 06/04/13 1210  TROPONINI <0.30 <0.30 <0.30   BNP: No components found with this basename: POCBNP,  CBG:  Recent Labs Lab  06/05/13 0436 06/05/13 0807 06/05/13 1115 06/05/13 1538 06/05/13 1643  GLUCAP 165* 134* 158* 101* 97    No results found for this or any previous visit (from the past 240 hour(s)).   Scheduled Meds: . carvedilol  12.5 mg Oral BID WC  . finasteride  5 mg Oral Daily  . heparin  5,000 Units Subcutaneous 3 times per day  . insulin aspart  0-15 Units Subcutaneous 6 times per day  . insulin glargine  10 Units  Subcutaneous QHS  . oxybutynin  5 mg Oral Daily  . pantoprazole  40 mg Oral Daily  . ramipril  10 mg Oral Daily  . vitamin B-12  1,000 mcg Oral Daily   Continuous Infusions: . sodium chloride       Thailan Sava, DO  [facility] Pager [phone]  If 7PM-7AM, please contact night-coverage [email or web address] Password [email or web address] 06/05/2013, 7:05 PM   LOS: 2 days

## 2013-06-06 DIAGNOSIS — R42 Dizziness and giddiness: Secondary | ICD-10-CM

## 2013-06-06 DIAGNOSIS — I10 Essential (primary) hypertension: Secondary | ICD-10-CM

## 2013-06-06 LAB — GLUCOSE, CAPILLARY
GLUCOSE-CAPILLARY: 161 mg/dL — AB (ref 70–99)
Glucose-Capillary: 279 mg/dL — ABNORMAL HIGH (ref 70–99)

## 2013-06-06 LAB — BASIC METABOLIC PANEL
BUN: 19 mg/dL (ref 6–23)
CALCIUM: 8.5 mg/dL (ref 8.4–10.5)
CO2: 23 mEq/L (ref 19–32)
Chloride: 104 mEq/L (ref 96–112)
Creatinine, Ser: 1.12 mg/dL (ref 0.50–1.35)
GFR calc Af Amer: 71 mL/min — ABNORMAL LOW (ref >90)
GFR, EST NON AFRICAN AMERICAN: 61 mL/min — AB (ref >90)
Glucose, Bld: 164 mg/dL — ABNORMAL HIGH (ref 70–99)
Potassium: 4.3 mEq/L (ref 3.7–5.3)
SODIUM: 139 meq/L (ref 137–147)

## 2013-06-06 NOTE — Progress Notes (Signed)
SUBJECTIVE:  Denies CP or acute SOB.    PHYSICAL EXAM Filed Vitals:   06/05/13 1700 06/05/13 1715 06/05/13 1730 06/05/13 1745  BP: 156/63 157/68 155/68 147/80  Pulse: 59 62 64 60  Temp:      TempSrc:      Resp:      Height:      Weight:      SpO2: 100% 99% 99% 98%   General:  No distress Lungs:  Clear Heart:  RRR Abdomen:  Positive bowel sounds, no rebound no guarding Extremities:  No edema   LABS:  Results for orders placed during the hospital encounter of 06/03/13 (from the past 24 hour(s))  GLUCOSE, CAPILLARY     Status: Abnormal   Collection Time    06/05/13 11:15 AM      Result Value Ref Range   Glucose-Capillary 158 (*) 70 - 99 mg/dL  POCT I-STAT 3, BLOOD GAS (G3P V)     Status: Abnormal   Collection Time    06/05/13  2:35 PM      Result Value Ref Range   pH, Ven 7.379 (*) 7.250 - 7.300   pCO2, Ven 43.0 (*) 45.0 - 50.0 mmHg   pO2, Ven 34.0  30.0 - 45.0 mmHg   Bicarbonate 25.4 (*) 20.0 - 24.0 mEq/L   TCO2 27  0 - 100 mmol/L   O2 Saturation 63.0     Sample type VENOUS     Comment NOTIFIED PHYSICIAN    POCT I-STAT 3, BLOOD GAS (G3+)     Status: Abnormal   Collection Time    06/05/13  2:42 PM      Result Value Ref Range   pH, Arterial 7.405  7.350 - 7.450   pCO2 arterial 38.5  35.0 - 45.0 mmHg   pO2, Arterial 69.0 (*) 80.0 - 100.0 mmHg   Bicarbonate 24.1 (*) 20.0 - 24.0 mEq/L   TCO2 25  0 - 100 mmol/L   O2 Saturation 94.0     Sample type ARTERIAL    GLUCOSE, CAPILLARY     Status: Abnormal   Collection Time    06/05/13  3:38 PM      Result Value Ref Range   Glucose-Capillary 101 (*) 70 - 99 mg/dL  GLUCOSE, CAPILLARY     Status: None   Collection Time    06/05/13  4:43 PM      Result Value Ref Range   Glucose-Capillary 97  70 - 99 mg/dL  GLUCOSE, CAPILLARY     Status: Abnormal   Collection Time    06/05/13  8:44 PM      Result Value Ref Range   Glucose-Capillary 238 (*) 70 - 99 mg/dL  BASIC METABOLIC PANEL     Status: Abnormal   Collection  Time    06/06/13  4:43 AM      Result Value Ref Range   Sodium 139  137 - 147 mEq/L   Potassium 4.3  3.7 - 5.3 mEq/L   Chloride 104  96 - 112 mEq/L   CO2 23  19 - 32 mEq/L   Glucose, Bld 164 (*) 70 - 99 mg/dL   BUN 19  6 - 23 mg/dL   Creatinine, Ser 1.12  0.50 - 1.35 mg/dL   Calcium 8.5  8.4 - 10.5 mg/dL   GFR calc non Af Amer 61 (*) >90 mL/min   GFR calc Af Amer 71 (*) >90 mL/min  GLUCOSE, CAPILLARY     Status:  Abnormal   Collection Time    06/06/13  6:29 AM      Result Value Ref Range   Glucose-Capillary 161 (*) 70 - 99 mg/dL    Intake/Output Summary (Last 24 hours) at 06/06/13 0911 Last data filed at 06/06/13 6237  Gross per 24 hour  Intake    480 ml  Output   1000 ml  Net   -520 ml    ASSESSMENT AND PLAN:  DIZZINESS:  No evidence of arrhythmia.  No further cardiac work up.  No cardiac etiology.  Questionably secondary to meds.   CAD:  Enzymes negative.   Cath as reported elsewhere.  Right heart pressures OK.  Medical management of CAD.  No clear cardiac etiology for dyspnea.  Further work up per primary team.      [name] [name] 06/06/2013 9:11 AM

## 2013-06-06 NOTE — Progress Notes (Signed)
VASCULAR LAB PRELIMINARY  PRELIMINARY  PRELIMINARY  PRELIMINARY  Carotid Dopplers completed.    Preliminary report:  1-39% ICA stenosis.  Vertebral artery flow is antegrade.  Ric Rosenberg, RVT 06/06/2013, 2:00 PM

## 2013-06-06 NOTE — Progress Notes (Signed)
Patient D/C'd from 2w31. Telemetry D/C'd and returned to cubby. IV D/C'd. Discharge paperwork reviewed with patient and patient states "no questions." Patient transported via wheelchair  to be picked up by wife. Marland Kitchen

## 2013-06-06 NOTE — Discharge Summary (Signed)
Physician Discharge Summary  [name] [name] WNI:[id number] DOB: [birth year] DOA: 06/03/2013  PCP: Viviana Simpler, MD  Admit date: 06/03/2013 Discharge date: 06/06/2013  Recommendations for Outpatient Follow-up:  1. Pt will need to follow up with PCP in 2 weeks post discharge 2. Please obtain BMP to evaluate electrolytes and kidney function 3. Please also check CBC to evaluate Hg and Hct levels   Discharge Diagnoses:  Principal Problem:   Shortness of breath Active Problems:   CAD   Type II or unspecified type diabetes mellitus without mention of complication, not stated as uncontrolled   CAD (coronary artery disease) Dyspnea on exertion  -concerned about angina equivalent -likely related to deconditioning vs lung pathology  -06/05/2013 heart catheterization--severe 3x native vessel dz, patent grafts, normal LV function  -outpt PFTs  -Troponins negative x3-  Dizziness  -Likely medication related vs dysautonomia from DM  -Patient currently in a clinical trial for long-acting insulin  -Patient relates intermittent hypoglycemia related to dizziness  -Carotid ultrasound--negative for hemodynamically significant stenosis -Outpatient ENT followup  -Discontinue benzodiazepine the patient was taking before admission  Diabetes mellitus type 2  -Hemoglobin A1c 7.0  -Patient to continue on his clinical trial at [facility] after discharge  HTN/CAD  -Continue carvedilol, Altace  -Continue aspirin  Thrombocytopenia  -Chronic  -No active bleeding  -Hemoglobin stable  -outpt followup  Family Communication: Wife at beside  Disposition Plan: Home when medically stable   Discharge Condition: Stable  Disposition:  discharge home  Diet: Heart healthy Wt Readings from Last 3 Encounters:  06/05/13 98 kg (216 lb 0.8 oz)  06/05/13 98 kg (216 lb 0.8 oz)  02/08/13 96.163 kg (212 lb)      Hospital Course:  80 yo man with CAD s/p CABG x4, dyslipidemia, hypertension who has had  vertigo and weakness dating back to Thursday, ~ 3-4 days prior to admission that he treated with antivert and valium with some improvement of vertigo but continued weakness and dizziness leading to presentation. He is not more clear in describing his dizziness. He says that he has associated SOB that is more than typical during this same time period that is concerning and similar to when he required bypass surgery. Even small walking tasks around the home produces significant SOB. He follows with Dr. Rockey Situ who hsa stated that he would seek LHC should Mr. Holzschuh have progressive SOB in previous visits. Mr. Caster was going to see Dr. Hedy Camara in a few weeks for routine follow-up. Otherwise, no new fever/chills/diarrhea. No other sick contacts or changes. He last had a cardiac examination in 2009 with CT revealing patent SVG to PDA, SVG to OM, SVG to diagonal and a LIMA to LAD patent. Because of concerns of his dizziness and dyspnea on exertion possibly relating to an angina equivalent, the patient was made to the hospital and cardiology was consulted. The patient underwent cardiac catheterization on 06/05/2013 which showed severe three-vessel native disease. There were no blockages to his grafts. Medical therapy was recommended. Throughout the hospitalization, the patient's dizziness and shortness of breath improved. Carotid ultrasound was performed and was negative for any hemodynamically significant stenosis.   Consultants: cardiology  Discharge Exam: Filed Vitals:   06/06/13 1422  BP: 168/75  Pulse: 62  Temp: 97.7 F (36.5 C)  Resp: 18   Filed Vitals:   06/05/13 1730 06/05/13 1745 06/06/13 1100 06/06/13 1422  BP: 155/68 147/80  168/75  Pulse: 64 60  62  Temp:    97.7 F (36.5  C)  TempSrc:    Oral  Resp:    18  Height:      Weight:      SpO2: 99% 98% 90% 100%   General: A&O x 3, NAD, pleasant, cooperative Cardiovascular: RRR, no rub, no gallop, no S3 Respiratory: CTAB, no wheeze, no  rhonchi Abdomen:soft, nontender, nondistended, positive bowel sounds Extremities: No edema, No lymphangitis, no petechiae  Discharge Instructions       Future Appointments Provider Department Dept Phone   06/14/2013 2:00 PM Venia Carbon, MD [place] [facility] [place] [phone]   06/18/2013 10:45 AM Minna Merritts, MD [place] [phone]       Medication List         acetaminophen 650 MG CR tablet  Commonly known as:  TYLENOL  Take 650 mg by mouth every 8 (eight) hours as needed for pain. For hand arthritis     aspirin 81 MG tablet  Take 162 mg by mouth daily.     B-6 PO  Take 1 tablet by mouth daily.     Biotin 1000 MCG tablet  Take 1,000 mcg by mouth daily.     carvedilol 12.5 MG tablet  Commonly known as:  COREG  Take 1 tablet (12.5 mg total) by mouth 2 (two) times daily with a meal.     Cod Liver Oil Caps  Take 1 capsule by mouth daily.     EARACHE DROPS Soln  Place 4 drops in ear(s) daily as needed (for ear pain).     finasteride 5 MG tablet  Commonly known as:  PROSCAR  Take 1 tablet (5 mg total) by mouth daily.     glimepiride 1 MG tablet  Commonly known as:  AMARYL  Take 1 mg by mouth daily with breakfast.     multivitamin tablet  Take 1 tablet by mouth daily.     omeprazole 20 MG capsule  Commonly known as:  PRILOSEC  Take 20 mg by mouth daily.     oxybutynin 5 MG tablet  Commonly known as:  DITROPAN  Take 1 tablet (5 mg total) by mouth daily.     ramipril 10 MG capsule  Commonly known as:  ALTACE  Take 1 capsule (10 mg total) by mouth daily.     STUDY MEDICATION  Inject 24 Units into the skin at bedtime.     vitamin B-12 1000 MCG tablet  Commonly known as:  CYANOCOBALAMIN  Take 1,000 mcg by mouth daily.     vitamin C 100 MG tablet  Take 100 mg by mouth daily.     Vitamin D 2000 UNITS tablet  Take 4,000 Units by mouth daily.         The results of significant diagnostics from this hospitalization  (including imaging, microbiology, ancillary and laboratory) are listed below for reference.    Significant Diagnostic Studies: Ct Head Wo Contrast  06/03/2013   CLINICAL DATA:  Dizziness.  EXAM: CT HEAD WITHOUT CONTRAST  TECHNIQUE: Contiguous axial images were obtained from the base of the skull through the vertex without intravenous contrast.  COMPARISON:  None.  FINDINGS: No intracranial hemorrhage.  No CT evidence of large acute infarct.  No intracranial mass lesion noted on this unenhanced exam.  No hydrocephalus.  Vascular calcifications.  IMPRESSION: No intracranial hemorrhage or CT evidence of large acute infarct.  Vascular calcifications.   Electronically Signed   By: Chauncey Cruel M.D.   On: 06/03/2013 17:02     Microbiology:  No results found for this or any previous visit (from the past 240 hour(s)).   Labs: Basic Metabolic Panel:  Recent Labs Lab 06/03/13 1554 06/05/13 0353 06/06/13 0443  NA 139 138 139  K 4.9 4.3 4.3  CL 104 102 104  CO2 23 22 23   GLUCOSE 326* 166* 164*  BUN 23 24* 19  CREATININE 1.27 1.22 1.12  CALCIUM 9.1 9.0 8.5   Liver Function Tests: No results found for this basename: AST, ALT, ALKPHOS, BILITOT, PROT, ALBUMIN,  in the last 168 hours No results found for this basename: LIPASE, AMYLASE,  in the last 168 hours No results found for this basename: AMMONIA,  in the last 168 hours CBC:  Recent Labs Lab 06/03/13 1554 06/05/13 0353  WBC 9.5 9.5  HGB 16.4 15.7  HCT 44.3 42.9  MCV 91.0 91.7  PLT 115* 105*   Cardiac Enzymes:  Recent Labs Lab 06/04/13 0016 06/04/13 0620 06/04/13 1210  TROPONINI <0.30 <0.30 <0.30   BNP: No components found with this basename: POCBNP,  CBG:  Recent Labs Lab 06/05/13 1538 06/05/13 1643 06/05/13 2044 06/06/13 0629 06/06/13 1110  GLUCAP 101* 97 238* 161* 279*    Time coordinating discharge:  Greater than 30 minutes  Signed:  Simaya Lumadue, DO [facility] Pager: [phone] 06/06/2013, 2:36  PM

## 2013-06-14 ENCOUNTER — Encounter: Payer: Medicare Other | Admitting: Internal Medicine

## 2013-06-18 ENCOUNTER — Encounter: Payer: Self-pay | Admitting: Cardiovascular Disease

## 2013-06-18 ENCOUNTER — Ambulatory Visit (INDEPENDENT_AMBULATORY_CARE_PROVIDER_SITE_OTHER): Payer: Medicare Other | Admitting: Cardiovascular Disease

## 2013-06-18 VITALS — BP 130/60 | HR 71 | Ht 74.0 in | Wt 219.2 lb

## 2013-06-18 DIAGNOSIS — I10 Essential (primary) hypertension: Secondary | ICD-10-CM

## 2013-06-18 DIAGNOSIS — R0602 Shortness of breath: Secondary | ICD-10-CM

## 2013-06-18 DIAGNOSIS — Z951 Presence of aortocoronary bypass graft: Secondary | ICD-10-CM

## 2013-06-18 DIAGNOSIS — E119 Type 2 diabetes mellitus without complications: Secondary | ICD-10-CM

## 2013-06-18 DIAGNOSIS — E785 Hyperlipidemia, unspecified: Secondary | ICD-10-CM

## 2013-06-18 DIAGNOSIS — I2510 Atherosclerotic heart disease of native coronary artery without angina pectoris: Secondary | ICD-10-CM

## 2013-06-18 DIAGNOSIS — J069 Acute upper respiratory infection, unspecified: Secondary | ICD-10-CM | POA: Insufficient documentation

## 2013-06-18 MED ORDER — GUAIFENESIN-CODEINE 100-10 MG/5ML PO SOLN: 10.0000 mL | Freq: Three times a day (TID) | ORAL | Status: DC | PRN

## 2013-06-18 NOTE — Assessment & Plan Note (Signed)
He reports that he would like to restart his Crestor. He does not feel this was causing his cramping

## 2013-06-18 NOTE — Assessment & Plan Note (Addendum)
Recent hospitalization was reviewed with him. Results were discussed. Patent grafts on recent cardiac catheterization. No further workup at this time

## 2013-06-18 NOTE — Assessment & Plan Note (Signed)
Blood pressure is well controlled on today's visit. No changes made to the medications. 

## 2013-06-18 NOTE — Assessment & Plan Note (Signed)
We have encouraged continued exercise, careful diet management in an effort to lose weight. 

## 2013-06-18 NOTE — Assessment & Plan Note (Signed)
Recently started his second round of antibiotics given to him by urgent care. Cough is pronounced and debilitating. We have given him a prescription for Robitussin with codeine. Suggested he followup with Dr. Silvio Pate if symptoms do not improve.

## 2013-06-18 NOTE — Patient Instructions (Addendum)
You are doing well. Please use the robitussin with codeine for sleep as needed for sleep  Please call us if you have new issues that need to be addressed before your next appt.  Your physician wants you to follow-up in: 6 months.  You will receive a reminder letter in the mail two months in advance. If you don't receive a letter, please call our office to schedule the follow-up appointment.

## 2013-06-18 NOTE — Progress Notes (Signed)
Patient ID: [name] [name], male    DOB: 09/23/1933, 80 y.o.   MRN: [id number]  HPI Comments: 80 year old gentleman with a history of coronary artery disease, bypass surgery in November 2006, history of diabetes, hyperlipidemia, hypertension, erectile dysfunction who presents for routine followup.   He reports developing chest discomfort and went to [place]. He had catheterization 06/05/2013 that showed patent grafts with LIMA to the LAD, vein graft to the diagonal, vein graft to the OM 3, vein graft to the distal RCA. Normal filling pressures and normal LV function.  And his catheterization, he has developed upper respiratory infection about 10 days ago. He has been to urgent care twice. Completed a Z-Pak, cough medicine, back to urgent care for additional antibiotics doxycycline twice a day now on cough syrup with no relief of his symptoms. Patient and his wife report cough has been severe. he's not sleeping well, not eating well. Also reports having significant cramping in his legs. Cramping persisted despite holding his statin. He would like to restart his statin. Hemoglobin A1c in the low sevens. Now participating in a study at [facility] where they manage his insulin .  Echocardiogram April 2009 shows normal systolic function with mild LVH, diastolic relaxation abnormality, mildly enlarged left atrium, mild aortic insufficiency.    Cardiac CT scan in March 2009 showed severe coronary artery disease with patent grafts x4, saphenous vein graft to the PDA, OM, diagonal and a LIMA graft to the LAD.    Labs from May 2011 shows total cholesterol 138, LDL 69, HDL 32, hemoglobin A1c around 7    EKG shows normal sinus rhythm with rate 71 beats per minute, right bundle branch block, no significant ST or T wave changes    Outpatient Encounter Prescriptions as of 78/14/2015  Medication Sig  . acetaminophen (TYLENOL) 650 MG CR tablet Take 650 mg by mouth every 8 (eight) hours as needed for pain. For  hand arthritis  . Ascorbic Acid (VITAMIN C) 100 MG tablet Take 100 mg by mouth daily.  Marland Kitchen aspirin 81 MG tablet Take 162 mg by mouth daily.   . Biotin 1000 MCG tablet Take 1,000 mcg by mouth daily.  . carvedilol (COREG) 12.5 MG tablet Take 1 tablet (12.5 mg total) by mouth 2 (two) times daily with a meal.  . Cholecalciferol (VITAMIN D) 2000 UNITS tablet Take 4,000 Units by mouth daily.  Marland Kitchen Cod Liver Oil CAPS Take 1 capsule by mouth daily.  Marland Kitchen doxycycline (MONODOX) 100 MG capsule Take 100 mg by mouth 2 (two) times daily.   . finasteride (PROSCAR) 5 MG tablet Take 1 tablet (5 mg total) by mouth daily.  . fluticasone (FLONASE) 50 MCG/ACT nasal spray Place 2 sprays into both nostrils daily.   Marland Kitchen glimepiride (AMARYL) 1 MG tablet Take 1 mg by mouth daily with breakfast.  . Homeopathic Products (EARACHE DROPS) SOLN Place 4 drops in ear(s) daily as needed (for ear pain).  . Multiple Vitamin (MULTIVITAMIN) tablet Take 1 tablet by mouth daily.  Marland Kitchen omeprazole (PRILOSEC) 20 MG capsule Take 20 mg by mouth daily.  Marland Kitchen oxybutynin (DITROPAN) 5 MG tablet Take 1 tablet (5 mg total) by mouth daily.  . Pyridoxine HCl (B-6 PO) Take 1 tablet by mouth daily.  . ramipril (ALTACE) 10 MG capsule Take 1 capsule (10 mg total) by mouth daily.  . STUDY MEDICATION Inject 24 Units into the skin at bedtime.  . vitamin B-12 (CYANOCOBALAMIN) 1000 MCG tablet Take 1,000 mcg by mouth daily.  Review of Systems  Constitutional: Negative.   HENT: Negative.   Eyes: Negative.   Respiratory: Positive for cough.   Cardiovascular: Negative.   Gastrointestinal: Negative.   Endocrine: Negative.   Musculoskeletal: Negative.        Pain in the ball of his foot  Skin: Negative.   Allergic/Immunologic: Negative.   Neurological: Negative.   Hematological: Negative.   Psychiatric/Behavioral: Negative.   All other systems reviewed and are negative.   BP 130/60  Pulse 71  Ht 6\' 2"  (1.88 m)  Wt 219 lb 4 oz (99.451 kg)  BMI 28.14  kg/m2  Physical Exam  Nursing note and vitals reviewed. Constitutional: He is oriented to person, place, and time. He appears well-developed and well-nourished.  Obese  HENT:  Head: Normocephalic.  Nose: Nose normal.  Mouth/Throat: Oropharynx is clear and moist.  Eyes: Conjunctivae are normal. Pupils are equal, round, and reactive to light.  Neck: Normal range of motion. Neck supple. No JVD present.  Cardiovascular: Normal rate, regular rhythm, S1 normal, S2 normal, normal heart sounds and intact distal pulses.  Exam reveals no gallop and no friction rub.   No murmur heard. Pulmonary/Chest: Effort normal and breath sounds normal. No respiratory distress. He has no wheezes. He has no rales. He exhibits no tenderness.  Abdominal: Soft. Bowel sounds are normal. He exhibits no distension. There is no tenderness.  Musculoskeletal: Normal range of motion. He exhibits no edema and no tenderness.  Lymphadenopathy:    He has no cervical adenopathy.  Neurological: He is alert and oriented to person, place, and time. Coordination normal.  Skin: Skin is warm and dry. No rash noted. No erythema.  Psychiatric: He has a normal mood and affect. His behavior is normal. Judgment and thought content normal.      Assessment and Plan

## 2013-06-19 ENCOUNTER — Encounter: Payer: Self-pay | Admitting: Internal Medicine

## 2013-06-24 ENCOUNTER — Other Ambulatory Visit: Payer: Self-pay | Admitting: Internal Medicine

## 2013-06-24 NOTE — Telephone Encounter (Signed)
rx sent to pharmacy by e-script  

## 2013-06-27 MED ORDER — NITROGLYCERIN IN D5W 200-5 MCG/ML-% IV SOLN
INTRAVENOUS | Status: AC
  Filled 2013-06-27: qty 250

## 2013-07-05 ENCOUNTER — Other Ambulatory Visit: Payer: Self-pay | Admitting: Cardiovascular Disease

## 2013-07-26 ENCOUNTER — Telehealth: Payer: Self-pay | Admitting: *Deleted

## 2013-07-26 DIAGNOSIS — E785 Hyperlipidemia, unspecified: Secondary | ICD-10-CM

## 2013-07-26 NOTE — Telephone Encounter (Signed)
Spoke w/ pt.  He reports that he restarted his atorvastatin about 2 weeks ago and his legs are cramping again.  He inquired about his most recent chol level, but the last report we have is from 06/13/12. He would like to know if he can have this drawn and see if he needs to be on chol meds at all.

## 2013-07-26 NOTE — Telephone Encounter (Signed)
Patient called and Astratin is giving him leg cramps again. Please call patient.

## 2013-07-26 NOTE — Telephone Encounter (Signed)
Needs a fasting lipid panel

## 2013-07-30 ENCOUNTER — Ambulatory Visit (INDEPENDENT_AMBULATORY_CARE_PROVIDER_SITE_OTHER): Payer: Medicare Other | Admitting: *Deleted

## 2013-07-30 DIAGNOSIS — E785 Hyperlipidemia, unspecified: Secondary | ICD-10-CM

## 2013-07-30 NOTE — Telephone Encounter (Signed)
Pt coming in this am for labs.

## 2013-07-30 NOTE — Telephone Encounter (Signed)
Left message for pt to call back to schedule appt to come in for lab draw.

## 2013-07-31 LAB — LIPID PANEL
CHOL/HDL RATIO: 2.9 ratio (ref 0.0–5.0)
Cholesterol, Total: 112 mg/dL (ref 100–199)
HDL: 39 mg/dL — AB (ref >39)
LDL Calculated: 61 mg/dL (ref 0–99)
Triglycerides: 59 mg/dL (ref 0–149)
VLDL CHOLESTEROL CAL: 12 mg/dL (ref 5–40)

## 2013-08-14 ENCOUNTER — Telehealth: Payer: Self-pay

## 2013-08-14 NOTE — Telephone Encounter (Signed)
Pt would like to know status of cholesterol medication, pt would also like lab results. Please call.

## 2013-08-15 NOTE — Telephone Encounter (Signed)
See result note.  

## 2013-09-16 ENCOUNTER — Telehealth: Payer: Self-pay | Admitting: *Deleted

## 2013-09-16 NOTE — Telephone Encounter (Signed)
Patient call and left foot swelling. What can he do? Please call

## 2013-09-17 NOTE — Telephone Encounter (Signed)
Spoke w/ pt.  He reports that his left foot has been swollen for several months and seems to be worsening.  Denies redness, pain, or warmth.  Reports that he has bursitis, only gets relief from cortisone injections, but this increases his BG. He would like to speak w/ [name] [name] about some type of pain reliever that he can take, as Tylenol is not relieving his pain.  Inquired if pt had spoken w/ his PCP, but he states "[name] [name] is pretty much my primary care doctor". Advised pt that [name] [name] cannot prescribe his pain meds.  He verbalizes understanding, but would like to come speak w/ [name] [name] and have his foot evaluated.  Pt sched for 09/25/13 @ 9:15.

## 2013-09-21 ENCOUNTER — Other Ambulatory Visit: Payer: Self-pay | Admitting: Internal Medicine

## 2013-09-21 ENCOUNTER — Other Ambulatory Visit: Payer: Self-pay | Admitting: Cardiovascular Disease

## 2013-09-23 NOTE — Telephone Encounter (Signed)
Needs an appt then I can refill

## 2013-09-25 ENCOUNTER — Ambulatory Visit (INDEPENDENT_AMBULATORY_CARE_PROVIDER_SITE_OTHER): Payer: Medicare Other | Admitting: Cardiovascular Disease

## 2013-09-25 ENCOUNTER — Encounter: Payer: Self-pay | Admitting: Cardiovascular Disease

## 2013-09-25 VITALS — BP 132/64 | HR 68 | Ht 74.0 in | Wt 218.5 lb

## 2013-09-25 DIAGNOSIS — E785 Hyperlipidemia, unspecified: Secondary | ICD-10-CM

## 2013-09-25 DIAGNOSIS — I2510 Atherosclerotic heart disease of native coronary artery without angina pectoris: Secondary | ICD-10-CM

## 2013-09-25 DIAGNOSIS — I10 Essential (primary) hypertension: Secondary | ICD-10-CM

## 2013-09-25 DIAGNOSIS — G4762 Sleep related leg cramps: Secondary | ICD-10-CM

## 2013-09-25 DIAGNOSIS — E119 Type 2 diabetes mellitus without complications: Secondary | ICD-10-CM

## 2013-09-25 DIAGNOSIS — M159 Polyosteoarthritis, unspecified: Secondary | ICD-10-CM

## 2013-09-25 MED ORDER — DICLOFENAC SODIUM 1 % TD GEL: 2.0000 g | Freq: Four times a day (QID) | TRANSDERMAL | Status: DC | PRN

## 2013-09-25 NOTE — Assessment & Plan Note (Signed)
Recommended regular exercise and light stretching. Currently not on a statin

## 2013-09-25 NOTE — Assessment & Plan Note (Addendum)
He sees a specialist, previously had cortisone shots. He is requesting additional help. Voltaren cream called in

## 2013-09-25 NOTE — Assessment & Plan Note (Signed)
Blood pressure is well controlled on today's visit. No changes made to the medications. 

## 2013-09-25 NOTE — Assessment & Plan Note (Signed)
We have encouraged continued exercise, careful diet management in an effort to lose weight. 

## 2013-09-25 NOTE — Progress Notes (Signed)
Patient ID: [name] [name], male    DOB: 09/05/1933, 80 y.o.   MRN: [id number]  HPI Comments: 80 year old gentleman with a history of coronary artery disease, bypass surgery in November 2006, history of diabetes, hyperlipidemia, hypertension, erectile dysfunction who presents for routine followup.   In general he reports doing well with no symptoms. He is now doing a regular exercise program as he does have discomfort in his feet Hemoglobin A1c 6.7.   participating in a study at [facility] where they manage his insulin . 6 Some diffuse arthralgias with prednisone/cortisone shots to his knee and hip in the past Still has occasional cramping in his legs, despite holding his statin Trace edema in his left ankle Total cholesterol 112, LDL 61 with no cholesterol medication  catheterization for chest pain 06/05/2013 that showed patent grafts with LIMA to the LAD, vein graft to the diagonal, vein graft to the OM 3, vein graft to the distal RCA. Normal filling pressures and normal LV function. After his catheterization, he has developed upper respiratory infection   Echocardiogram April 2009 shows normal systolic function with mild LVH, diastolic relaxation abnormality, mildly enlarged left atrium, mild aortic insufficiency. Cardiac CT scan in March 2009 showed severe coronary artery disease with patent grafts x4, saphenous vein graft to the PDA, OM, diagonal and a LIMA graft to the LAD.    EKG shows normal sinus rhythm with rate 68 beats per minute, right bundle branch block, no significant ST or T wave changes    Outpatient Encounter Prescriptions as of 78/22/2015  Medication Sig  . acetaminophen (TYLENOL) 650 MG CR tablet Take 650 mg by mouth every 8 (eight) hours as needed for pain. For hand arthritis  . Ascorbic Acid (VITAMIN C) 100 MG tablet Take 100 mg by mouth daily.  Marland Kitchen aspirin 81 MG tablet Take 162 mg by mouth daily.   . Biotin 1000 MCG tablet Take 1,000 mcg by mouth daily.  . carvedilol  (COREG) 12.5 MG tablet TAKE ONE TABLET TWICE A DAY WITH MEALS.  Marland Kitchen Cholecalciferol (VITAMIN D) 2000 UNITS tablet Take 4,000 Units by mouth daily.  . finasteride (PROSCAR) 5 MG tablet Take 1 tablet (5 mg total) by mouth daily.  Marland Kitchen glimepiride (AMARYL) 1 MG tablet TAKE 1 TABLET EVERY DAY  . Multiple Vitamin (MULTIVITAMIN) tablet Take 1 tablet by mouth daily.  Marland Kitchen omeprazole (PRILOSEC) 20 MG capsule Take 20 mg by mouth daily.  Marland Kitchen oxybutynin (DITROPAN) 5 MG tablet Take 1 tablet (5 mg total) by mouth daily.  . ramipril (ALTACE) 10 MG capsule TAKE ONE CAPSULE DAILY  . STUDY MEDICATION Inject 26 Units into the skin at bedtime.   . vitamin B-12 (CYANOCOBALAMIN) 1000 MCG tablet Take 1,000 mcg by mouth daily.   Review of Systems  Constitutional: Negative.   HENT: Negative.   Eyes: Negative.   Respiratory: Negative.   Cardiovascular: Negative.   Gastrointestinal: Negative.   Endocrine: Negative.   Musculoskeletal: Positive for arthralgias and myalgias.       Pain in the ball of his foot  Skin: Negative.   Allergic/Immunologic: Negative.   Neurological: Negative.   Hematological: Negative.   Psychiatric/Behavioral: Negative.   All other systems reviewed and are negative.   BP 132/64  Pulse 68  Ht 6\' 2"  (1.88 m)  Wt 218 lb 8 oz (99.111 kg)  BMI 28.04 kg/m2  Physical Exam  Nursing note and vitals reviewed. Constitutional: He is oriented to person, place, and time. He appears well-developed and well-nourished.  Obese  HENT:  Head: Normocephalic.  Nose: Nose normal.  Mouth/Throat: Oropharynx is clear and moist.  Eyes: Conjunctivae are normal. Pupils are equal, round, and reactive to light.  Neck: Normal range of motion. Neck supple. No JVD present.  Cardiovascular: Normal rate, regular rhythm, S1 normal, S2 normal, normal heart sounds and intact distal pulses.  Exam reveals no gallop and no friction rub.   No murmur heard. Pulmonary/Chest: Effort normal and breath sounds normal. No  respiratory distress. He has no wheezes. He has no rales. He exhibits no tenderness.  Abdominal: Soft. Bowel sounds are normal. He exhibits no distension. There is no tenderness.  Musculoskeletal: Normal range of motion. He exhibits no edema and no tenderness.  Lymphadenopathy:    He has no cervical adenopathy.  Neurological: He is alert and oriented to person, place, and time. Coordination normal.  Skin: Skin is warm and dry. No rash noted. No erythema.  Psychiatric: He has a normal mood and affect. His behavior is normal. Judgment and thought content normal.      Assessment and Plan

## 2013-09-25 NOTE — Assessment & Plan Note (Signed)
Surprisingly cholesterol is well controlled and he has not been on a statin for many months. Will not restart a statin given his cramping

## 2013-09-25 NOTE — Assessment & Plan Note (Signed)
Currently with no symptoms of angina. No further workup at this time. Continue current medication regimen. 

## 2013-09-25 NOTE — Patient Instructions (Addendum)
You are doing well. No medication changes were made.   voltaren cream for arthritis as needed  Please call us if you have new issues that need to be addressed before your next appt.  Your physician wants you to follow-up in: 6 months.  You will receive a reminder letter in the mail two months in advance. If you don't receive a letter, please call our office to schedule the follow-up appointment.

## 2013-10-21 ENCOUNTER — Other Ambulatory Visit: Payer: Self-pay | Admitting: Cardiovascular Disease

## 2013-11-05 LAB — HEMOGLOBIN A1C: Hgb A1c MFr Bld: 6.5 % — AB (ref 4.0–6.0)

## 2013-11-20 ENCOUNTER — Encounter: Payer: Medicare Other | Admitting: Internal Medicine

## 2013-11-29 ENCOUNTER — Telehealth: Payer: Self-pay | Admitting: *Deleted

## 2013-11-29 DIAGNOSIS — R609 Edema, unspecified: Secondary | ICD-10-CM

## 2013-11-29 MED ORDER — FUROSEMIDE 20 MG PO TABS: ORAL_TABLET | ORAL | Status: DC

## 2013-11-29 NOTE — Telephone Encounter (Signed)
Spoke w/ pt.  Advised him of Ryan's recommendation.  He is agreeable and will call back w/ any questions or concerns.  Pt sched to see Thurmond Butts on Wed 12/04/13.

## 2013-11-29 NOTE — Telephone Encounter (Signed)
I will call in a couple Lasix for him, but would like for him to be seen first of the week. Should his symptoms worsen or anything change over the weekend he should go to the ER.

## 2013-11-29 NOTE — Telephone Encounter (Signed)
Spoke w/ pt.  He reports that he has been out of town "on the road" for the last few weeks.  States that he has not been eating right, elevating his feet or "doing anything that he told me to do". Pt reports 6 lb wt gain and swelling in his lower extremities.  Denies SOB.  He states that he does not have any diuretics or compression hose.  He would like to know if Dr. Rockey Situ would call in something to pull the water off, as he knows it's fluid.  Please advise. Thank you.

## 2013-11-29 NOTE — Telephone Encounter (Signed)
He is having problems with swelling in his feet, ankles and legs.

## 2013-11-29 NOTE — Addendum Note (Signed)
Addended by: Rise Mu on: 11/29/2013 04:51 PM   Modules accepted: Orders

## 2013-12-02 ENCOUNTER — Encounter: Payer: Self-pay | Admitting: Physician Assistant

## 2013-12-04 ENCOUNTER — Encounter: Payer: Self-pay | Admitting: Physician Assistant

## 2013-12-04 ENCOUNTER — Ambulatory Visit (INDEPENDENT_AMBULATORY_CARE_PROVIDER_SITE_OTHER): Payer: Medicare Other | Admitting: Physician Assistant

## 2013-12-04 VITALS — BP 120/62 | HR 65 | Ht 74.0 in | Wt 219.5 lb

## 2013-12-04 DIAGNOSIS — I10 Essential (primary) hypertension: Secondary | ICD-10-CM

## 2013-12-04 DIAGNOSIS — E119 Type 2 diabetes mellitus without complications: Secondary | ICD-10-CM

## 2013-12-04 DIAGNOSIS — R600 Localized edema: Secondary | ICD-10-CM

## 2013-12-04 DIAGNOSIS — L03116 Cellulitis of left lower limb: Secondary | ICD-10-CM

## 2013-12-04 DIAGNOSIS — L03119 Cellulitis of unspecified part of limb: Secondary | ICD-10-CM

## 2013-12-04 DIAGNOSIS — L02419 Cutaneous abscess of limb, unspecified: Secondary | ICD-10-CM

## 2013-12-04 DIAGNOSIS — R609 Edema, unspecified: Secondary | ICD-10-CM

## 2013-12-04 DIAGNOSIS — R0602 Shortness of breath: Secondary | ICD-10-CM

## 2013-12-04 DIAGNOSIS — I25810 Atherosclerosis of coronary artery bypass graft(s) without angina pectoris: Secondary | ICD-10-CM

## 2013-12-04 NOTE — Progress Notes (Signed)
Patient Name: [name] [name], Hepp [birth year], MRN [id number]  Date of Encounter: 12/04/2013  Primary Care Provider:  Viviana Simpler, MD Primary Cardiologist:  Rockey Situ, MD  Patient Profile:  80 y.o. male with history of CAD s/p 4 vessel CABG 01/2005, IDDM, HTN, & HLD who presents to clinic today stating he has been out of town and "on the road" for the last few weeks and not eating the right foods or "doing anything Dr. Rockey Situ told me to do." During this time he has noted a 6 pound weight gain and swelling in his bilateral lower extremities.      Problem List:   Past Medical History  Diagnosis Date  . Coronary artery disease     a. 4v CABG 01/2005; b. cath 06/05/13 showed patent grafts: LIMA-LAD, VG-D, VG-OM3, VG-dRCA, nl filling pressures, nl LV fxn  . Hyperlipidemia   . Hypertension   . IDDM (insulin dependent diabetes mellitus) 2000  . Hypertrophy of prostate without urinary obstruction and other lower urinary tract symptoms (LUTS)   . GERD (gastroesophageal reflux disease)   . Perirectal fistula   . Cataract   . ED (erectile dysfunction)   . Osteoarthritis   . History of echocardiogram     a. echo 06/2593: nl systolic fxn, mild LVH, diastolic relaxation abnormality, mildly enlarged LA, mild Ao insufficiency   Past Surgical History  Procedure Laterality Date  . Coronary artery bypass graft  2007    x 4  . Foot surgery  2012    right foot  . Lumbar laminectomy  1989  . Joint replacement  8/10    Right THR--[name]  . Colonoscopy w/ polypectomy    . Cholecystectomy  05/15/2011    Procedure: LAPAROSCOPIC CHOLECYSTECTOMY;  Surgeon: Rolm Bookbinder, MD;  Location: [place];  Service: General;  Laterality: N/A;  . Cataract extraction  sept 2013    right  . Cardiac catheterization  06/05/2013    [facility].      Allergies:  No Known Allergies   HPI:  80 y.o. male with the above problem list who comes into the office today stating he has been out of town for the last  few weeks and not eating the right things or "doing anything Dr. Rockey Situ told me to do." He called into the office on 11/29/13 and reported the above - several Lasix were called in for him and he was advised to come into the office for follow up first of the week.  Patient with history of CAD s/p 4 vessel CABG 01/2005. His last cardiac cath was 06/05/2013 for chest pain showed patent grafts with LIMA-LAD, VG-D, VG-OM3, and VG-dRCA. Normal filling filling pressures and normal LV function.   He has a history of left lower extremity swelling at baseline for years 2/2 a surgery.   He comes in today after going on a long road trip over the past several months in his RV with his wife during which they did a fair amount of driving. On the last 2 days this driving included 10 hours with 3-4 hours at a time in order to get back home. Upon his arrival back home he noticed some increased swelling in the left lower extremity & his weight was up 6 pounds prompting him to call into the office. He denied any SOB or chest pain. Lasix 20 mg daily was called in to help him out through the weekend with short term follow up. He denies any history of cancer, recent surgery,  leg trauma, coagulopathy (or family history of coagulopathy). He did smoke for a couple of years as a teenager, but has not since. He denies any chest pain, nausea, vomiting, diaphoresis, palpitations, presyncope, or syncope.   He did have a mild cellulitis of the left anterior shin while on this road trip that was successfully treated in [place] with Keflex.     Home Medications:  Prior to Admission medications   Medication Sig Start Date End Date Taking? Authorizing Provider  acetaminophen (TYLENOL) 650 MG CR tablet Take 650 mg by mouth every 8 (eight) hours as needed for pain. For hand arthritis    Historical Provider, MD  Ascorbic Acid (VITAMIN C) 100 MG tablet Take 100 mg by mouth daily.    Historical Provider, MD  aspirin 81 MG tablet Take 162 mg by  mouth daily.     Historical Provider, MD  Biotin 1000 MCG tablet Take 1,000 mcg by mouth daily.    Historical Provider, MD  carvedilol (COREG) 12.5 MG tablet TAKE ONE TABLET TWICE A DAY WITH MEALS.    Minna Merritts, MD  Cholecalciferol (VITAMIN D) 2000 UNITS tablet Take 4,000 Units by mouth daily.    Historical Provider, MD  diclofenac sodium (VOLTAREN) 1 % GEL Apply 2 g topically 4 (four) times daily as needed. 09/25/13   Minna Merritts, MD  finasteride (PROSCAR) 5 MG tablet Take 1 tablet (5 mg total) by mouth daily. 04/16/12   Venia Carbon, MD  furosemide (LASIX) 20 MG tablet Take one tab daily for the next 3 days, then stop and follow up at the office. 11/29/13   Rise Mu, PA-C  glimepiride (AMARYL) 1 MG tablet TAKE 1 TABLET EVERY DAY 06/24/13   Venia Carbon, MD  Multiple Vitamin (MULTIVITAMIN) tablet Take 1 tablet by mouth daily.    Historical Provider, MD  omeprazole (PRILOSEC) 20 MG capsule Take 20 mg by mouth daily.    Historical Provider, MD  oxybutynin (DITROPAN) 5 MG tablet Take 1 tablet (5 mg total) by mouth daily. 05/29/13   Venia Carbon, MD  ramipril (ALTACE) 10 MG capsule TAKE ONE CAPSULE DAILY 10/21/13   Minna Merritts, MD  STUDY MEDICATION Inject 26 Units into the skin at bedtime.     Historical Provider, MD  vitamin B-12 (CYANOCOBALAMIN) 1000 MCG tablet Take 1,000 mcg by mouth daily.    Historical Provider, MD     Weights: Filed Weights   12/04/13 1414  Weight: 219 lb 8 oz (99.565 kg)     Review of Systems:  All other systems reviewed and are otherwise negative except as noted above.  Physical Exam:  Blood pressure 120/62, pulse 65, height 6\' 2"  (1.88 m), weight 219 lb 8 oz (99.565 kg).  General: Pleasant, NAD Psych: Normal affect. Neuro: Alert and oriented X 3. Moves all extremities spontaneously. HEENT: Normal  Neck: Supple without bruits or JVD. Lungs:  Resp regular and unlabored, CTA. Heart: RRR no s3, s4, or murmurs. Abdomen: Soft,  non-tender, non-distended, BS + x 4.  Extremities: No clubbing, cyanosis. Left lower extremity with trace non pitting edema to the mid shin. No cording. No erythema, warmth, or TTP. Chronic venous stasis changes bilaterally. DP/PT/Radials 2+ and equal bilaterally.   Accessory Clinical Findings:  EKG - NSR, 66, known RBBB  Assessment & Plan:  1. Left lower extremity edema: -He has been on an extensive road trip lately - including a couple of days with 10 hours of driving total (3-4  hours at a time) -Last LHC with patent grafts, nl filling pressures, & nl LV function  -Has taken Lasix 20 mg daily x 5 doses with decent response - weight down to baseline this AM (216, baseline 215) & left lower extremity swelling almost back to baseline (has chronic swelling 2/2 prior surgery years ago -Check echo to evaluate LV function  -Check lower extremity doppler to r/o DVT -Check BMET -Further medication management pending the above studies  2. History of left anterior shin cellulitis: -Treated successfully while on road trip in [place] with Keflex -No further antibiotic treatment required  3. CAD s/p 4 vessel CABG 01/2005: -No anginal symptoms -Continue current medications  4. HTN: -Well controlled -No changes  5. DM2: -He lives a fairly sedentary lifestyle. Encouraged him to slowly advance his activity as tolerated   Christell Faith, PA-C [place] [address] [place], [place] [id number] [phone] [place] 12/04/2013, 4:23 PM

## 2013-12-04 NOTE — Patient Instructions (Signed)
Your physician has requested that you have an echocardiogram. Echocardiography is a painless test that uses sound waves to create images of your heart. It provides your doctor with information about the size and shape of your heart and how well your heart's chambers and valves are working. This procedure takes approximately one hour. There are no restrictions for this procedure.  Your physician has requested that you have a lower venous duplex. This test is an ultrasound of the veins in the legs. It looks at venous blood flow that carries blood from the heart to the legs. Allow one hour for a Lower Venous exam. Allow thirty minutes. There are no restrictions or special instructions.   Your physician recommends that you have labs today:  BMP   Your physician recommends that you schedule a follow-up appointment in:  After your tests   .

## 2013-12-05 LAB — BASIC METABOLIC PANEL
BUN/Creatinine Ratio: 15 (ref 10–22)
BUN: 19 mg/dL (ref 8–27)
CO2: 21 mmol/L (ref 18–29)
Calcium: 8.9 mg/dL (ref 8.6–10.2)
Chloride: 102 mmol/L (ref 97–108)
Creatinine, Ser: 1.24 mg/dL (ref 0.76–1.27)
GFR calc non Af Amer: 55 mL/min/{1.73_m2} — ABNORMAL LOW (ref >59)
GFR, EST AFRICAN AMERICAN: 64 mL/min/{1.73_m2} (ref >59)
Glucose: 262 mg/dL — ABNORMAL HIGH (ref 65–99)
POTASSIUM: 4.1 mmol/L (ref 3.5–5.2)
Sodium: 140 mmol/L (ref 134–144)

## 2013-12-24 ENCOUNTER — Other Ambulatory Visit: Payer: Self-pay

## 2013-12-24 ENCOUNTER — Encounter (INDEPENDENT_AMBULATORY_CARE_PROVIDER_SITE_OTHER): Payer: Medicare Other

## 2013-12-24 ENCOUNTER — Other Ambulatory Visit (INDEPENDENT_AMBULATORY_CARE_PROVIDER_SITE_OTHER): Payer: Medicare Other

## 2013-12-24 DIAGNOSIS — I340 Nonrheumatic mitral (valve) insufficiency: Secondary | ICD-10-CM

## 2013-12-24 DIAGNOSIS — R600 Localized edema: Secondary | ICD-10-CM

## 2014-01-01 ENCOUNTER — Telehealth: Payer: Self-pay | Admitting: *Deleted

## 2014-01-01 ENCOUNTER — Encounter: Payer: Self-pay | Admitting: Cardiovascular Disease

## 2014-01-01 ENCOUNTER — Ambulatory Visit: Payer: Medicare Other | Admitting: Physician Assistant

## 2014-01-01 ENCOUNTER — Ambulatory Visit (INDEPENDENT_AMBULATORY_CARE_PROVIDER_SITE_OTHER): Payer: Medicare Other | Admitting: Cardiovascular Disease

## 2014-01-01 VITALS — BP 142/80 | HR 62 | Ht 73.0 in | Wt 222.0 lb

## 2014-01-01 DIAGNOSIS — I2510 Atherosclerotic heart disease of native coronary artery without angina pectoris: Secondary | ICD-10-CM

## 2014-01-01 DIAGNOSIS — Z951 Presence of aortocoronary bypass graft: Secondary | ICD-10-CM

## 2014-01-01 DIAGNOSIS — E785 Hyperlipidemia, unspecified: Secondary | ICD-10-CM

## 2014-01-01 DIAGNOSIS — I5032 Chronic diastolic (congestive) heart failure: Secondary | ICD-10-CM

## 2014-01-01 DIAGNOSIS — I10 Essential (primary) hypertension: Secondary | ICD-10-CM

## 2014-01-01 DIAGNOSIS — I359 Nonrheumatic aortic valve disorder, unspecified: Secondary | ICD-10-CM

## 2014-01-01 DIAGNOSIS — E118 Type 2 diabetes mellitus with unspecified complications: Secondary | ICD-10-CM

## 2014-01-01 DIAGNOSIS — R079 Chest pain, unspecified: Secondary | ICD-10-CM

## 2014-01-01 MED ORDER — FUROSEMIDE 20 MG PO TABS: 20.0000 mg | ORAL_TABLET | Freq: Every day | ORAL | Status: DC | PRN

## 2014-01-01 NOTE — Telephone Encounter (Signed)
See echo 10/20 result note

## 2014-01-01 NOTE — Patient Instructions (Addendum)
Your next appointment will be scheduled in our new office located at :  [facility]  [address], [place], [place] [address]   You are doing well. No medication changes were made.  Please take lasix as needed for significant leg swelling in the right leg  Please call us if you have new issues that need to be addressed before your next appt.  Your physician wants you to follow-up in: 6 months.  You will receive a reminder letter in the mail two months in advance. If you don't receive a letter, please call our office to schedule the follow-up appointment.

## 2014-01-01 NOTE — Assessment & Plan Note (Addendum)
He denies any angina. No changes to his medications

## 2014-01-01 NOTE — Assessment & Plan Note (Signed)
Currently not on a statin. Unclear of his last lipid panel was drawn while he was on Crestor. He does not think that he was on a statin at that time

## 2014-01-01 NOTE — Progress Notes (Signed)
Patient ID: [name] [name], male    DOB: [birth year], 80 y.o.   MRN: [id number]  HPI Comments: 80 year old gentleman with a history of coronary artery disease, bypass surgery in November 2006, history of diabetes, hyperlipidemia, hypertension, erectile dysfunction who presents for routine followup.   He recently took a trip [place] with long car trip for 10 hours x2 days each way. He had significant lower extremity edema. He took additional doses of Lasix with improvement of his swelling. He reports having chronic swelling of the left lower extremity, swelling of the right lower extremity has resolved. Doppler of the lower extremity on the left showed no DVT  Periodically has subxiphoid pain that is sharp, unclear if it is associated with food. No longer taking a statin as this caused leg cramps He continues to bike 3-6 miles per day. Unable to walk long distances secondary to chronic knee and hip pain  Hemoglobin A1c 6.7.   participating in a study at [facility] where they manage his insulin . 6 Total cholesterol 112, LDL 61 with no cholesterol medication  catheterization for chest pain 06/05/2013 that showed patent grafts with LIMA to the LAD, vein graft to the diagonal, vein graft to the OM 3, vein graft to the distal RCA. Normal filling pressures and normal LV function.  Echocardiogram April 2009 shows normal systolic function with mild LVH, diastolic relaxation abnormality, mildly enlarged left atrium, mild aortic insufficiency. Cardiac CT scan in March 2009 showed severe coronary artery disease with patent grafts x4, saphenous vein graft to the PDA, OM, diagonal and a LIMA graft to the LAD.    EKG shows normal sinus rhythm with rate 62 beats per minute, right bundle branch block, no significant ST or T wave changes    Outpatient Encounter Prescriptions as of 01/01/2014  Medication Sig  . acetaminophen (TYLENOL) 650 MG CR tablet Take 650 mg by mouth every 8 (eight) hours as needed for pain.  For hand arthritis  . Ascorbic Acid (VITAMIN C) 100 MG tablet Take 100 mg by mouth daily.  Marland Kitchen aspirin 81 MG tablet Take 162 mg by mouth daily.   . Biotin 1000 MCG tablet Take 1,000 mcg by mouth daily.  . carvedilol (COREG) 12.5 MG tablet TAKE ONE TABLET TWICE A DAY WITH MEALS.  Marland Kitchen Cholecalciferol (VITAMIN D) 2000 UNITS tablet Take 4,000 Units by mouth daily.  . diclofenac sodium (VOLTAREN) 1 % GEL Apply 2 g topically 4 (four) times daily as needed.  . finasteride (PROSCAR) 5 MG tablet Take 1 tablet (5 mg total) by mouth daily.  Marland Kitchen glimepiride (AMARYL) 1 MG tablet TAKE 1/2 TABLET AM AND PM DAILY.  . Multiple Vitamin (MULTIVITAMIN) tablet Take 1 tablet by mouth daily.  Marland Kitchen omeprazole (PRILOSEC) 20 MG capsule Take 20 mg by mouth daily.  Marland Kitchen oxybutynin (DITROPAN) 5 MG tablet Take 1 tablet (5 mg total) by mouth daily.  . ramipril (ALTACE) 10 MG capsule TAKE ONE CAPSULE DAILY  . STUDY MEDICATION Inject 26 Units into the skin at bedtime.   . furosemide (LASIX) 20 MG tablet Take 1 tablet (20 mg total) by mouth daily as needed.    Review of Systems  Constitutional: Negative.   HENT: Negative.   Eyes: Negative.   Respiratory: Negative.   Cardiovascular: Positive for leg swelling.  Gastrointestinal: Negative.   Endocrine: Negative.   Musculoskeletal: Positive for arthralgias.  Skin: Negative.   Allergic/Immunologic: Negative.   Neurological: Negative.   Hematological: Negative.   Psychiatric/Behavioral: Negative.   All  other systems reviewed and are negative.   BP 142/80  Pulse 62  Ht 6\' 1"  (1.854 m)  Wt 222 lb (100.699 kg)  BMI 29.30 kg/m2  Physical Exam  Nursing note and vitals reviewed. Constitutional: He is oriented to person, place, and time. He appears well-developed and well-nourished.  Obese  HENT:  Head: Normocephalic.  Nose: Nose normal.  Mouth/Throat: Oropharynx is clear and moist.  Eyes: Conjunctivae are normal. Pupils are equal, round, and reactive to light.  Neck: Normal  range of motion. Neck supple. No JVD present.  Cardiovascular: Normal rate, regular rhythm, S1 normal, S2 normal, normal heart sounds and intact distal pulses.  Exam reveals no gallop and no friction rub.   No murmur heard. Pulmonary/Chest: Effort normal and breath sounds normal. No respiratory distress. He has no wheezes. He has no rales. He exhibits no tenderness.  Abdominal: Soft. Bowel sounds are normal. He exhibits no distension. There is no tenderness.  Musculoskeletal: Normal range of motion. He exhibits no edema and no tenderness.  Lymphadenopathy:    He has no cervical adenopathy.  Neurological: He is alert and oriented to person, place, and time. Coordination normal.  Skin: Skin is warm and dry. No rash noted. No erythema.  Psychiatric: He has a normal mood and affect. His behavior is normal. Judgment and thought content normal.      Assessment and Plan

## 2014-01-01 NOTE — Assessment & Plan Note (Signed)
Recent improvement of his symptoms with one week of Lasix. It prescription provided for him to take as needed for worsening leg edema, particularly of the right lower extremity. He has chronic edema of the left lower extremity

## 2014-01-01 NOTE — Assessment & Plan Note (Signed)
Currently with no symptoms of angina. No further workup at this time. Continue current medication regimen. 

## 2014-01-01 NOTE — Assessment & Plan Note (Signed)
Blood pressure is well controlled on today's visit. No changes made to the medications. 

## 2014-01-01 NOTE — Assessment & Plan Note (Signed)
We have encouraged continued exercise, careful diet management in an effort to lose weight. Weight continues to be a problem

## 2014-01-10 ENCOUNTER — Encounter: Payer: Self-pay | Admitting: Internal Medicine

## 2014-01-10 ENCOUNTER — Ambulatory Visit (INDEPENDENT_AMBULATORY_CARE_PROVIDER_SITE_OTHER): Payer: Medicare Other | Admitting: Internal Medicine

## 2014-01-10 ENCOUNTER — Ambulatory Visit (INDEPENDENT_AMBULATORY_CARE_PROVIDER_SITE_OTHER)
Admission: RE | Admit: 2014-01-10 | Discharge: 2014-01-10 | Disposition: A | Discharge status: Home / Self Care | Payer: Medicare Other | Source: Ambulatory Visit | Attending: Internal Medicine | Admitting: Internal Medicine

## 2014-01-10 VITALS — BP 148/70 | HR 67 | Temp 97.6°F | Wt 222.0 lb

## 2014-01-10 DIAGNOSIS — R0789 Other chest pain: Secondary | ICD-10-CM

## 2014-01-10 DIAGNOSIS — R079 Chest pain, unspecified: Secondary | ICD-10-CM

## 2014-01-10 NOTE — Progress Notes (Signed)
Pre visit review using our clinic review tool, if applicable. No additional management support is needed unless otherwise documented below in the visit note. 

## 2014-01-10 NOTE — Progress Notes (Signed)
Subjective:    Patient ID: [name] [name], male    DOB: [birth year], 79 y.o.   MRN: [id number]  HPI Here with wife Has a subxiphoid pain for several weeks Intermittent sharp pain Known hiatal hernia in past---not like the pain he remembers from this. This was 20-30 years ago  No particular time that this occurs Not related to food On the omeprazole tums no good Mostly in day--occasional at night May last hours but sometimes shorter Not related to activity  No SOB or nausea with this Chronic back pain--this pain doesn't radiate there  Current Outpatient Prescriptions on File Prior to Visit  Medication Sig Dispense Refill  . acetaminophen (TYLENOL) 650 MG CR tablet Take 650 mg by mouth every 8 (eight) hours as needed for pain. For hand arthritis    . Ascorbic Acid (VITAMIN C) 100 MG tablet Take 100 mg by mouth daily.    Marland Kitchen aspirin 81 MG tablet Take 162 mg by mouth daily.     . Biotin 1000 MCG tablet Take 1,000 mcg by mouth daily.    . carvedilol (COREG) 12.5 MG tablet TAKE ONE TABLET TWICE A DAY WITH MEALS. 180 tablet 3  . Cholecalciferol (VITAMIN D) 2000 UNITS tablet Take 4,000 Units by mouth daily.    . diclofenac sodium (VOLTAREN) 1 % GEL Apply 2 g topically 4 (four) times daily as needed. 100 g 3  . finasteride (PROSCAR) 5 MG tablet Take 1 tablet (5 mg total) by mouth daily. 90 tablet 1  . glimepiride (AMARYL) 1 MG tablet TAKE 1/2 TABLET AM AND PM DAILY.    . Multiple Vitamin (MULTIVITAMIN) tablet Take 1 tablet by mouth daily.    Marland Kitchen omeprazole (PRILOSEC) 20 MG capsule Take 20 mg by mouth daily.    Marland Kitchen oxybutynin (DITROPAN) 5 MG tablet Take 1 tablet (5 mg total) by mouth daily. 30 tablet 3  . ramipril (ALTACE) 10 MG capsule TAKE ONE CAPSULE DAILY 30 capsule 6  . STUDY MEDICATION Inject 26 Units into the skin at bedtime.      No current facility-administered medications on file prior to visit.    No Known Allergies  Past Medical History  Diagnosis Date  . Coronary artery  disease     a. 4v CABG 01/2005; b. cath 06/05/13 showed patent grafts: LIMA-LAD, VG-D, VG-OM3, VG-dRCA, nl filling pressures, nl LV fxn  . Hyperlipidemia   . Hypertension   . IDDM (insulin dependent diabetes mellitus) 2000  . Hypertrophy of prostate without urinary obstruction and other lower urinary tract symptoms (LUTS)   . GERD (gastroesophageal reflux disease)   . Perirectal fistula   . Cataract   . ED (erectile dysfunction)   . Osteoarthritis   . History of echocardiogram     a. echo 06/979: nl systolic fxn, mild LVH, diastolic relaxation abnormality, mildly enlarged LA, mild Ao insufficiency    Past Surgical History  Procedure Laterality Date  . Coronary artery bypass graft  2007    x 4  . Foot surgery  2012    right foot  . Lumbar laminectomy  1989  . Joint replacement  8/10    Right THR--[name]  . Colonoscopy w/ polypectomy    . Cholecystectomy  05/15/2011    Procedure: LAPAROSCOPIC CHOLECYSTECTOMY;  Surgeon: Rolm Bookbinder, MD;  Location: [facility];  Service: General;  Laterality: N/A;  . Cataract extraction  sept 2013    right  . Cardiac catheterization  06/05/2013    [facility]  Family History  Problem Relation Age of Onset  . Lung cancer Mother   . Heart disease Father   . Colon cancer Neg Hx     History   Social History  . Marital Status: Divorced    Spouse Name: N/A    Number of Children: 39  . Years of Education: N/A   Occupational History  . 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 Smoking status: Never Smoker   . Smokeless tobacco: Never Used  . Alcohol Use: Yes     Comment: occ cocktail  . Drug Use: No  . Sexual Activity: Not on file   Other Topics Concern  . Not on file   Social History Narrative   [name]   Has living will    Has designated [employer] health care POA   Would accept resuscitation attempts   Would not want tube feeds if cognitively unaware    Review of Systems Appetite is stable Weight slightly up Rides bike 4 days per week No recent work that could cause strain No cough No SOB    Objective:   Physical Exam  Constitutional: He appears well-developed and well-nourished. No distress.  Neck: Normal range of motion. Neck supple. No thyromegaly present.  Cardiovascular: Normal rate, regular rhythm and normal heart sounds.  Exam reveals no gallop.   No murmur heard. Pulmonary/Chest: Effort normal and breath sounds normal. No respiratory distress. He has no wheezes. He has no rales.  Some lower sternal pain with palpation--similar to his pain   Abdominal: Soft. He exhibits no distension. There is no rebound and no guarding.  Slight upper abd sensitivity but not really tender  Lymphadenopathy:    He has no cervical adenopathy.  Psychiatric: He has a normal mood and affect. His behavior is normal.          Assessment & Plan:

## 2014-01-10 NOTE — Assessment & Plan Note (Addendum)
Repetitive sharp pain that does seem to localize to the sternum History doesn't suggest pulmonary or GI source He doesn't remember any tasks that would cause strain X-ray shows no obvious problem with sternal wires (though lowest one has slight separation of twisted portion) No obvious answer for now  ? Esophageal??  But no relationship to eating Still seems more mechanical ?try heat He will keep log to see if any pattern comes out

## 2014-01-13 ENCOUNTER — Encounter: Payer: Self-pay | Admitting: *Deleted

## 2014-01-21 ENCOUNTER — Encounter: Payer: Self-pay | Admitting: Physician Assistant

## 2014-02-04 ENCOUNTER — Telehealth: Payer: Self-pay | Admitting: Cardiovascular Disease

## 2014-02-04 NOTE — Telephone Encounter (Signed)
Is it ok for pt to take celebrex?

## 2014-02-04 NOTE — Telephone Encounter (Signed)
Pt saw dr Wynonia Musty, suggested that he take celebrex for joint pain. But needs to be cleared from [place]. Please call patient

## 2014-02-05 ENCOUNTER — Ambulatory Visit (INDEPENDENT_AMBULATORY_CARE_PROVIDER_SITE_OTHER): Payer: Medicare Other | Admitting: Internal Medicine

## 2014-02-05 ENCOUNTER — Encounter: Payer: Self-pay | Admitting: Internal Medicine

## 2014-02-05 VITALS — BP 140/70 | HR 60 | Temp 98.1°F | Ht 73.0 in | Wt 223.0 lb

## 2014-02-05 DIAGNOSIS — I2510 Atherosclerotic heart disease of native coronary artery without angina pectoris: Secondary | ICD-10-CM

## 2014-02-05 DIAGNOSIS — I5032 Chronic diastolic (congestive) heart failure: Secondary | ICD-10-CM

## 2014-02-05 DIAGNOSIS — E118 Type 2 diabetes mellitus with unspecified complications: Secondary | ICD-10-CM

## 2014-02-05 DIAGNOSIS — Z23 Encounter for immunization: Secondary | ICD-10-CM

## 2014-02-05 DIAGNOSIS — N400 Enlarged prostate without lower urinary tract symptoms: Secondary | ICD-10-CM

## 2014-02-05 DIAGNOSIS — Z0000 Encounter for general adult medical examination without abnormal findings: Secondary | ICD-10-CM

## 2014-02-05 LAB — HM DIABETES FOOT EXAM

## 2014-02-05 NOTE — Assessment & Plan Note (Signed)
Controlled Recent reassuring cath---grafts patent

## 2014-02-05 NOTE — Assessment & Plan Note (Signed)
Does okay on current regimen Discussed dry mouth with the med

## 2014-02-05 NOTE — Telephone Encounter (Signed)
NSAIDs such as Celebrex are associated with increased risk of cardiovascular events (heart attack, stroke) as well as increased risk of stomach bleeding while on aspirin. It also appears the patient has CKD stage II which would make usage of NSAID less desirable. He should weigh these risks in further discussion with his PCP. Given the above our preference would to instead pursue an alternative to NSAID therapy.

## 2014-02-05 NOTE — Assessment & Plan Note (Signed)
Compensated. No changes needed. 

## 2014-02-05 NOTE — Assessment & Plan Note (Signed)
Lab Results  Component Value Date   HGBA1C 6.5* 11/05/2013   Good control CAD related to this

## 2014-02-05 NOTE — Telephone Encounter (Signed)
Spoke w/ pt.  Advised him of Ryan's recommendation.  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  He would like to know there is a type of med that Dr. Rockey Situ would recommend that can give him some type of relief. Please advise.  Thank you.

## 2014-02-05 NOTE — Assessment & Plan Note (Signed)
I have personally reviewed the Medicare Annual Wellness questionnaire and have noted 1. The patient's medical and social history 2. Their use of alcohol, tobacco or illicit drugs 3. Their current medications and supplements 4. The patient's functional ability including ADL's, fall risks, home safety risks and hearing or visual             impairment. 5. Diet and physical activities 6. Evidence for depression or mood disorders  The patients weight, height, BMI and visual acuity have been recorded in the chart I have made referrals, counseling and provided education to the patient based review of the above and I have provided the pt with a written personalized care plan for preventive services.  I have provided you with a copy of your personalized plan for preventive services. Please take the time to review along with your updated medication list.  Recommended against PSA UTD on colonoscopy prevnar today Discussed lifestyle

## 2014-02-05 NOTE — Addendum Note (Signed)
Addended by: Despina Hidden on: 02/05/2014 03:31 PM   Modules accepted: Orders

## 2014-02-05 NOTE — Telephone Encounter (Signed)
He is in a predicament as most of the anti-inflammatories are relatively contraindicated given his coronary artery disease  That is not to say that if he is willing to take the risk, they could be tried at low doses In combination with other pain medication to make the discomfort tolerable

## 2014-02-05 NOTE — Progress Notes (Signed)
Subjective:    Patient ID: [name] [name], male    DOB: 10/20/1933, 80 y.o.   MRN: [id number]  HPI Here for physical and Medicare wellness visit Reviewed form and advanced directives Reviewed other doctors No tobacco Occasional drink of alcohol No falls No depression or anhedonia Tries to exercise regularly---rides bike if it doesn't rain Vision fine. Hearing okay Independent with instrumental ADLs Some memory issues--forgets names, etc  Sees endocrinologist at [place] actually had been running low so insulin cut Last A1c good No neuropathy in feet--just has joint pains in feet and hands  Sees urologist for [place] on meds for this Discussed that I recommend stopping PSA testing  Regular cardiology follow up Catheterization in April---looked okay  Current Outpatient Prescriptions on File Prior to Visit  Medication Sig Dispense Refill  . acetaminophen (TYLENOL) 650 MG CR tablet Take 650 mg by mouth every 8 (eight) hours as needed for pain. For hand arthritis    . Ascorbic Acid (VITAMIN C) 100 MG tablet Take 100 mg by mouth daily.    Marland Kitchen aspirin 81 MG tablet Take 162 mg by mouth daily.     . Biotin 1000 MCG tablet Take 1,000 mcg by mouth daily.    . carvedilol (COREG) 12.5 MG tablet TAKE ONE TABLET TWICE A DAY WITH MEALS. 180 tablet 3  . Cholecalciferol (VITAMIN D) 2000 UNITS tablet Take 4,000 Units by mouth daily.    . diclofenac sodium (VOLTAREN) 1 % GEL Apply 2 g topically 4 (four) times daily as needed. 100 g 3  . finasteride (PROSCAR) 5 MG tablet Take 1 tablet (5 mg total) by mouth daily. 90 tablet 1  . glimepiride (AMARYL) 1 MG tablet TAKE 1/2 TABLET AM AND PM DAILY.    . Multiple Vitamin (MULTIVITAMIN) tablet Take 1 tablet by mouth daily.    Marland Kitchen omeprazole (PRILOSEC) 20 MG capsule Take 20 mg by mouth daily.    Marland Kitchen oxybutynin (DITROPAN) 5 MG tablet Take 1 tablet (5 mg total) by mouth daily. 30 tablet 3  . ramipril (ALTACE) 10 MG capsule TAKE ONE CAPSULE DAILY 30  capsule 6   No current facility-administered medications on file prior to visit.    No Known Allergies  Past Medical History  Diagnosis Date  . Coronary artery disease     a. 4v CABG 01/2005; b. cath 06/05/13 showed patent grafts: LIMA-LAD, VG-D, VG-OM3, VG-dRCA, nl filling pressures, nl LV fxn  . Hyperlipidemia   . Hypertension   . IDDM (insulin dependent diabetes mellitus) 2000  . Hypertrophy of prostate without urinary obstruction and other lower urinary tract symptoms (LUTS)   . GERD (gastroesophageal reflux disease)   . Perirectal fistula   . Cataract   . ED (erectile dysfunction)   . Osteoarthritis   . Diastolic dysfunction     a. echo 08/3783: nl systolic fxn, mild LVH, diastolic relaxation abnormality, mildly enlarged LA, mild Ao insufficiency    Past Surgical History  Procedure Laterality Date  . Coronary artery bypass graft  2007    x 4  . Foot surgery  2012    right foot  . Lumbar laminectomy  1989  . Joint replacement  8/10    Right THR--[name]  . Colonoscopy w/ polypectomy    . Cholecystectomy  05/15/2011    Procedure: LAPAROSCOPIC CHOLECYSTECTOMY;  Surgeon: Rolm Bookbinder, MD;  Location: [facility];  Service: General;  Laterality: N/A;  . Cataract extraction  sept 2013    right  . Cardiac  catheterization  06/05/2013    [facility]     Family History  Problem Relation Age of Onset  . Lung cancer Mother   . Heart disease Father   . Colon cancer Neg Hx     History   Social History  . Marital Status: Divorced    Spouse Name: N/A    Number of Children: 58  . Years of Education: N/A   Occupational History  . 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 Smoking status: Never Smoker   . Smokeless tobacco: Never Used  . Alcohol Use: Yes     Comment: occ cocktail  . Drug Use: No  . Sexual Activity: Not on file   Other Topics Concern  . Not on file   Social History Narrative   Goes by [name]   Has living  will    Has designated Aletha Halim health care POA   Would accept resuscitation attempts   Would not want tube feeds if cognitively unaware   Review of Systems  Constitutional: Negative for fatigue and unexpected weight change.       Wears seat belt  HENT: Negative for dental problem, hearing loss, tinnitus and trouble swallowing.        Upper plate and partial bottom Dry mouth Recent ear pain---diagnosed with TMJ by Dr Tami Ribas  Eyes: Negative for visual disturbance.  Respiratory: Negative for cough, chest tightness and shortness of breath.   Cardiovascular: Positive for chest pain. Negative for palpitations and leg swelling.       Some substernal pain--better now Left leg swells  Gastrointestinal: Negative for nausea and vomiting.       Heartburn controlled with omeprazole (except with tomato sauce)   Endocrine: Negative for polydipsia and polyuria.  Genitourinary: Positive for frequency and difficulty urinating.       Nocturia is better No sex--no problem  Musculoskeletal: Positive for back pain and arthralgias. Negative for joint swelling.  Skin: Negative for rash.       No suspicious lesions  Allergic/Immunologic: Negative for environmental allergies and immunocompromised state.  Neurological: Negative for weakness, light-headedness, numbness and headaches.       Occ vertigo-- has meclizine for this  Hematological: Negative for adenopathy. Bruises/bleeds easily.  Psychiatric/Behavioral: Negative for sleep disturbance and dysphoric mood. The patient is not nervous/anxious.        Objective:   Physical Exam  Constitutional: He is oriented to person, place, and time. He appears well-developed and well-nourished. No distress.  HENT:  Head: Normocephalic and atraumatic.  Right Ear: External ear normal.  Left Ear: External ear normal.  Mouth/Throat: Oropharynx is clear and moist. No oropharyngeal exudate.  Eyes: Conjunctivae and EOM are normal. Pupils are  equal, round, and reactive to light.  Neck: Normal range of motion. Neck supple. No thyromegaly present.  Cardiovascular: Normal rate, regular rhythm, normal heart sounds and intact distal pulses.  Exam reveals no gallop.   No murmur heard. Pulmonary/Chest: Effort normal and breath sounds normal. No respiratory distress. He has no wheezes. He has no rales.  Abdominal: Soft. There is no tenderness.  Musculoskeletal: He exhibits no edema or tenderness.  Lymphadenopathy:    He has no cervical adenopathy.  Neurological: He is alert and oriented to person, place, and time.  President-- "Quay Burow, [place]" [phone] [id number] Recall 3/3  Normal sensation in plantar feet  Skin: No rash noted. No erythema.  No foot lesions  Psychiatric: He has  a normal mood and affect. His behavior is normal.          Assessment & Plan:

## 2014-02-05 NOTE — Progress Notes (Signed)
Pre visit review using our clinic review tool, if applicable. No additional management support is needed unless otherwise documented below in the visit note. 

## 2014-02-06 NOTE — Telephone Encounter (Signed)
Left detailed message on pt's vm w/ Dr. Gollan's recommendation. Asked him to call back w/ any questions or concerns.  

## 2014-02-13 ENCOUNTER — Encounter (HOSPITAL_COMMUNITY): Payer: Self-pay | Admitting: Cardiology

## 2014-03-17 DIAGNOSIS — M25511 Pain in right shoulder: Secondary | ICD-10-CM | POA: Diagnosis not present

## 2014-03-17 DIAGNOSIS — M150 Primary generalized (osteo)arthritis: Secondary | ICD-10-CM | POA: Diagnosis not present

## 2014-03-17 DIAGNOSIS — M791 Myalgia: Secondary | ICD-10-CM | POA: Diagnosis not present

## 2014-04-01 DIAGNOSIS — M150 Primary generalized (osteo)arthritis: Secondary | ICD-10-CM | POA: Diagnosis not present

## 2014-04-16 DIAGNOSIS — Z961 Presence of intraocular lens: Secondary | ICD-10-CM | POA: Diagnosis not present

## 2014-05-21 ENCOUNTER — Other Ambulatory Visit: Payer: Self-pay | Admitting: Cardiovascular Disease

## 2014-05-28 DIAGNOSIS — S40812A Abrasion of left upper arm, initial encounter: Secondary | ICD-10-CM | POA: Diagnosis not present

## 2014-06-16 DIAGNOSIS — N401 Enlarged prostate with lower urinary tract symptoms: Secondary | ICD-10-CM | POA: Diagnosis not present

## 2014-06-23 DIAGNOSIS — N401 Enlarged prostate with lower urinary tract symptoms: Secondary | ICD-10-CM | POA: Diagnosis not present

## 2014-06-24 NOTE — Op Note (Signed)
PATIENT NAME:  [name] [name], ATIENZA MR#:  [id number] DATE OF BIRTH:  [birth year]  DATE OF PROCEDURE:  11/22/2011  PREOPERATIVE DIAGNOSIS: Visually significant cataract of the right eye.   POSTOPERATIVE DIAGNOSIS: Visually significant cataract of the right eye.   OPERATIVE PROCEDURE: Cataract extraction by phacoemulsification with implant of intraocular lens to right eye.   SURGEON: Birder Robson, MD.   ANESTHESIA:  1. Managed anesthesia care.  2. Topical tetracaine drops followed by 2% Xylocaine jelly applied in the preoperative holding area.   COMPLICATIONS: None.   TECHNIQUE:  Stop and chop    DESCRIPTION OF PROCEDURE: The patient was examined and consented in the preoperative holding area where the aforementioned topical anesthesia was applied to the right eye and then brought back to the Operating Room where the right eye was prepped and draped in the usual sterile ophthalmic fashion and a lid speculum was placed. A paracentesis was created with the side port blade and the anterior chamber was filled with viscoelastic. A near clear corneal incision was performed with the steel keratome. A continuous curvilinear capsulorrhexis was performed with a cystotome followed by the capsulorrhexis forceps. Hydrodissection and hydrodelineation were carried out with BSS on a blunt cannula. The lens was removed in a stop and chop technique and the remaining cortical material was removed with the irrigation-aspiration handpiece. The capsular bag was inflated with viscoelastic and the Technis ZCBOO   20.0-diopter lens, serial number [id number], was placed in the capsular bag without complication. The remaining viscoelastic was removed from the eye with the irrigation-aspiration handpiece. The wounds were hydrated. The anterior chamber was flushed with Miostat and the eye was inflated to physiologic pressure. The wounds were found to be water tight. The eye was dressed with Vigamox. The patient was given  protective glasses to wear throughout the day and a shield with which to sleep tonight. The patient was also given drops with which to begin a drop regimen today and will follow-up with me in one day.   ____________________________ Livingston Diones. [place] Wahlberg, MD wlp:cbb D: 11/22/2011 12:31:36 ET T: 11/22/2011 13:56:26 ET JOB#: [id number]  cc: Odarius L. Anmarie Fukushima, MD, <Dictator> Livingston Diones Flavius Repsher MD ELECTRONICALLY SIGNED 12/26/2011 13:45

## 2014-07-01 ENCOUNTER — Encounter: Payer: Self-pay | Admitting: Cardiovascular Disease

## 2014-07-01 ENCOUNTER — Ambulatory Visit (INDEPENDENT_AMBULATORY_CARE_PROVIDER_SITE_OTHER): Payer: Medicare Other | Admitting: Cardiovascular Disease

## 2014-07-01 VITALS — BP 140/68 | HR 66 | Ht 74.0 in | Wt 226.8 lb

## 2014-07-01 DIAGNOSIS — I2510 Atherosclerotic heart disease of native coronary artery without angina pectoris: Secondary | ICD-10-CM

## 2014-07-01 DIAGNOSIS — E785 Hyperlipidemia, unspecified: Secondary | ICD-10-CM

## 2014-07-01 DIAGNOSIS — I10 Essential (primary) hypertension: Secondary | ICD-10-CM | POA: Diagnosis not present

## 2014-07-01 DIAGNOSIS — E118 Type 2 diabetes mellitus with unspecified complications: Secondary | ICD-10-CM

## 2014-07-01 DIAGNOSIS — I5032 Chronic diastolic (congestive) heart failure: Secondary | ICD-10-CM

## 2014-07-01 DIAGNOSIS — M159 Polyosteoarthritis, unspecified: Secondary | ICD-10-CM

## 2014-07-01 MED ORDER — CELECOXIB 200 MG PO CAPS: 200.0000 mg | ORAL_CAPSULE | Freq: Two times a day (BID) | ORAL | Status: DC

## 2014-07-01 MED ORDER — FUROSEMIDE 20 MG PO TABS: 20.0000 mg | ORAL_TABLET | Freq: Every day | ORAL | Status: DC | PRN

## 2014-07-01 NOTE — Progress Notes (Signed)
Patient ID: [name] [name], male    DOB: May 24, 1933, 81 y.o.   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  On 200 mg daily, he felt better, had less joint pain, knees were much better, left leg swelling resolved, had 10 pound weight loss. Since then he has stopped the medication and reports now having significant arthralgias, 10 or 13 pound weight gain, does not feel as well. He is concerned about cardiac risk and Celebrex  Also reports having significant prostate issues. Recently had cystoscoped with postprocedure pain lasting several days. Now feels reasonable.  Denies any shortness of breath or chest pain with exertion concerning for angina  EKG on today's visit shows normal sinus rhythm with rate 66 bpm, right bundle branch block  Other past medical history Previously took a trip [place] with long car trip for 10 hours x2 days each way. He had significant lower extremity edema. He took additional doses of Lasix with improvement of his swelling. He reports having chronic swelling of the left lower extremity, swelling of the right lower extremity has resolved. Doppler of the lower extremity on the left showed no DVT  Periodically has subxiphoid pain that is sharp, unclear if it is associated with food. No longer taking a statin as this caused leg cramps He continues to bike 3-6 miles per day. Unable to walk long distances secondary to chronic knee and hip pain  Hemoglobin A1c 6.7.   participating in a study at [facility] where they manage his insulin . 6 Total cholesterol 112, LDL 61 with no cholesterol medication  catheterization for chest pain 06/05/2013 that showed patent grafts with LIMA to the  LAD, vein graft to the diagonal, vein graft to the OM 3, vein graft to the distal RCA. Normal filling pressures and normal LV function.  Echocardiogram April 2009 shows normal systolic function with mild LVH, diastolic relaxation abnormality, mildly enlarged left atrium, mild aortic insufficiency. Cardiac CT scan in March 2009 showed severe coronary artery disease with patent grafts x4, saphenous vein graft to the PDA, OM, diagonal and a LIMA graft to the LAD.  No Known Allergies  Outpatient Encounter Prescriptions as of 07/01/2014  Medication Sig  . acetaminophen (TYLENOL) 650 MG CR tablet Take 650 mg by mouth every 8 (eight) hours as needed for pain. For hand arthritis  . alfuzosin (UROXATRAL) 10 MG 24 hr tablet Take 10 mg by mouth daily with breakfast.  . Ascorbic Acid (VITAMIN C) 100 MG tablet Take 100 mg by mouth daily.  Marland Kitchen aspirin 81 MG tablet Take 162 mg by mouth daily.   . Biotin 1000 MCG tablet Take 1,000 mcg by mouth daily.  . carvedilol (COREG) 12.5 MG tablet TAKE ONE TABLET TWICE A DAY WITH MEALS.  Marland Kitchen Cholecalciferol (VITAMIN D) 2000 UNITS tablet Take 1,000 Units by mouth daily.   . finasteride (PROSCAR) 5 MG tablet Take 1 tablet (5 mg total) by mouth daily.  Marland Kitchen glimepiride (AMARYL) 1 MG tablet TAKE 1/2 TABLET AM AND PM DAILY.  Marland Kitchen Glucosamine-Chondroit-Vit C-Mn (GLUCOSAMINE CHONDR 1500 COMPLX) CAPS Take by mouth daily.  . Multiple Vitamin (MULTIVITAMIN) tablet Take 1 tablet by mouth daily.  Marland Kitchen omeprazole (PRILOSEC) 20 MG capsule Take 20 mg by mouth 2 (two) times daily  before a meal.   . oxybutynin (DITROPAN) 5 MG tablet Take 1 tablet (5 mg total) by mouth daily.  . Potassium Gluconate 595 MG CAPS Take by mouth daily.  . ramipril (ALTACE) 10 MG capsule TAKE ONE CAPSULE DAILY  . STUDY MEDICATION Inject 28 Units into the skin at bedtime.   . celecoxib (CELEBREX) 200 MG capsule Take 1 capsule (200 mg total) by mouth 2 (two) times daily.  . furosemide (LASIX) 20 MG tablet Take 1 tablet (20  mg total) by mouth daily as needed.  . [DISCONTINUED] diclofenac sodium (VOLTAREN) 1 % GEL Apply 2 g topically 4 (four) times daily as needed. (Patient not taking: Reported on 07/01/2014)    Past Medical History  Diagnosis Date  . Coronary artery disease     a. 4v CABG 01/2005; b. cath 06/05/13 showed patent grafts: LIMA-LAD, VG-D, VG-OM3, VG-dRCA, nl filling pressures, nl LV fxn  . Hyperlipidemia   . Hypertension   . IDDM (insulin dependent diabetes mellitus) 2000  . Hypertrophy of prostate without urinary obstruction and other lower urinary tract symptoms (LUTS)   . GERD (gastroesophageal reflux disease)   . Perirectal fistula   . Cataract   . ED (erectile dysfunction)   . Osteoarthritis   . Diastolic dysfunction     a. echo 04/6376: nl systolic fxn, mild LVH, diastolic relaxation abnormality, mildly enlarged LA, mild Ao insufficiency    Past Surgical History  Procedure Laterality Date  . Coronary artery bypass graft  2007    x 4  . Foot surgery  2012    right foot  . Lumbar laminectomy  1989  . Joint replacement  8/10    Right THR--[name]  . Colonoscopy w/ polypectomy    . Cholecystectomy  05/15/2011    Procedure: LAPAROSCOPIC CHOLECYSTECTOMY;  Surgeon: Rolm Bookbinder, MD;  Location: [facility];  Service: General;  Laterality: N/A;  . Cataract extraction  sept 2013    right  . Cardiac catheterization  06/05/2013    [facility].   Marland Kitchen Left and right heart catheterization with coronary/graft angiogram N/A 06/05/2013    Procedure: LEFT AND RIGHT HEART CATHETERIZATION WITH Beatrix Fetters;  Surgeon: Peter M Martinique, MD;  Location: [facility] CATH LAB;  Service: Cardiovascular;  Laterality: N/A;    Social History  reports that he has never smoked. He has never used smokeless tobacco. He reports that he drinks alcohol. He reports that he does not use illicit drugs.  Family History family history includes Heart disease in his father; Lung cancer in his mother. There is no history of  Colon cancer.   Review of Systems  Constitutional: Negative.   Respiratory: Negative.   Cardiovascular: Positive for leg swelling.       Swelling of the left leg  Gastrointestinal: Negative.   Musculoskeletal: Positive for arthralgias.  Skin: Negative.   Neurological: Negative.   Hematological: Negative.   Psychiatric/Behavioral: Negative.   All other systems reviewed and are negative.   BP 140/68 mmHg  Pulse 66  Ht 6\' 2"  (1.88 m)  Wt 226 lb 12 oz (102.853 kg)  BMI 29.10 kg/m2  Physical Exam  Constitutional: He is oriented to person, place, and time. He appears well-developed and well-nourished.  Obese  HENT:  Head: Normocephalic.  Nose: Nose normal.  Mouth/Throat: Oropharynx is clear and moist.  Eyes: Conjunctivae are normal. Pupils are equal, round, and reactive to light.  Neck: Normal range of motion. Neck supple. No JVD present.  Cardiovascular: Normal rate, regular rhythm, S1  normal, S2 normal, normal heart sounds and intact distal pulses.  Exam reveals no gallop and no friction rub.   No murmur heard. Trace edema extending half for a up the shin on the left, no significant edema on the right  Pulmonary/Chest: Effort normal and breath sounds normal. No respiratory distress. He has no wheezes. He has no rales. He exhibits no tenderness.  Abdominal: Soft. Bowel sounds are normal. He exhibits no distension. There is no tenderness.  Musculoskeletal: Normal range of motion. He exhibits no edema or tenderness.  Lymphadenopathy:    He has no cervical adenopathy.  Neurological: He is alert and oriented to person, place, and time. Coordination normal.  Skin: Skin is warm and dry. No rash noted. No erythema.  Psychiatric: He has a normal mood and affect. His behavior is normal. Judgment and thought content normal.      Assessment and Plan   Nursing note and vitals reviewed.

## 2014-07-01 NOTE — Assessment & Plan Note (Signed)
Lab orders placed for lipids and liver

## 2014-07-01 NOTE — Assessment & Plan Note (Signed)
We have given him a prescription of Lasix to take periodically for worsening left leg edema. Recommended he take 2 potassium when he takes Lasix

## 2014-07-01 NOTE — Assessment & Plan Note (Signed)
He felt dramatically better on Celebrex 200 mg daily. We have discussed the risk and benefit of restarting Celebrex. He was much more active, had weight loss, felt better in general. We will continue Celebrex 200 mg daily

## 2014-07-01 NOTE — Assessment & Plan Note (Signed)
Blood pressure is well controlled on today's visit. No changes made to the medications. 

## 2014-07-01 NOTE — Patient Instructions (Signed)
You are doing well.  Consider starting celebrex one a day Lasix/furosemide as needed for leg swelling with two potassium  Please call us if you have new issues that need to be addressed before your next appt.  Your physician wants you to follow-up in: 6 months.  You will receive a reminder letter in the mail two months in advance. If you don't receive a letter, please call our office to schedule the follow-up appointment.

## 2014-07-01 NOTE — Assessment & Plan Note (Signed)
Currently with no symptoms of angina. No further workup at this time. Continue current medication regimen. 

## 2014-07-01 NOTE — Assessment & Plan Note (Signed)
We have encouraged continued exercise, careful diet management in an effort to lose weight. 

## 2014-07-04 ENCOUNTER — Other Ambulatory Visit (INDEPENDENT_AMBULATORY_CARE_PROVIDER_SITE_OTHER): Payer: Medicare Other | Admitting: *Deleted

## 2014-07-04 DIAGNOSIS — E785 Hyperlipidemia, unspecified: Secondary | ICD-10-CM

## 2014-07-04 DIAGNOSIS — I10 Essential (primary) hypertension: Secondary | ICD-10-CM

## 2014-07-04 DIAGNOSIS — I2510 Atherosclerotic heart disease of native coronary artery without angina pectoris: Secondary | ICD-10-CM | POA: Diagnosis not present

## 2014-07-05 LAB — HEPATIC FUNCTION PANEL
ALT: 11 IU/L (ref 0–44)
AST: 20 IU/L (ref 0–40)
Albumin: 3.7 g/dL (ref 3.5–4.8)
Alkaline Phosphatase: 105 IU/L (ref 39–117)
BILIRUBIN TOTAL: 0.7 mg/dL (ref 0.0–1.2)
Bilirubin, Direct: 0.18 mg/dL (ref 0.00–0.40)
TOTAL PROTEIN: 5.9 g/dL — AB (ref 6.0–8.5)

## 2014-07-05 LAB — LIPID PANEL
CHOL/HDL RATIO: 4.4 ratio (ref 0.0–5.0)
CHOLESTEROL TOTAL: 172 mg/dL (ref 100–199)
HDL: 39 mg/dL — AB (ref >39)
LDL Calculated: 110 mg/dL — ABNORMAL HIGH (ref 0–99)
TRIGLYCERIDES: 115 mg/dL (ref 0–149)
VLDL Cholesterol Cal: 23 mg/dL (ref 5–40)

## 2014-07-06 HISTORY — PX: OTHER SURGICAL HISTORY: SHX169

## 2014-07-21 ENCOUNTER — Telehealth: Payer: Self-pay | Admitting: Cardiovascular Disease

## 2014-07-21 DIAGNOSIS — D291 Benign neoplasm of prostate: Secondary | ICD-10-CM | POA: Diagnosis not present

## 2014-07-21 NOTE — Telephone Encounter (Signed)
Spoke w/pt. °

## 2014-07-21 NOTE — Telephone Encounter (Signed)
Pt c/o swelling: STAT is pt has developed SOB within 24 hours  1. How long have you been experiencing swelling? Years, progressing,  recently worse  2. Where is the swelling located? L Leg and foot increased new swelling in R side as well  3.  Are you currently taking a "fluid pill"? Yes about 4 days out of the week and hasnt noticed an improvement   4.  Are you currently SOB? Only on exertion and this is not a new symptom no SOB at rest   5.  Have you traveled recently?  No

## 2014-07-21 NOTE — Telephone Encounter (Signed)
Spoke w/ pt.  He c/o increased swelling in his ankles.  He did not take lasix today as he is driving and did not want to make frequent stops.  Pt does not have compression hose.  Advised pt to obtain compression hose and wear them, esp when driving long distances, and to try to take his lasix as prescribed.   Pt was previously on Celebrex w/ good results, but reports that it is not helping him now.  He requests a referral to an accupuncturist.  Advised him that I'm sure if ins will cover this, but to speak w/ PCP regarding referral.   Pt is sched for prostate surgery next week. Reports that we should receive cardiac clearance request from urologist in [place].  He asks if he should go ahead and stop his aspirin.  Advised him that we do not typically hold aspirin, but will call him if Thurmond Butts recommends otherwise.

## 2014-07-22 NOTE — Telephone Encounter (Signed)
Received cardiac clearance request for pt to proceed w/ laser PVP on 07/29/14. Per Christell Faith, PA, pt is asymptomatic from a cardiac standpoint and may continue all antiplatelet & anticoagulant agents as prescribed throughout the preop period.   It is acceptable to continue aspirin 81 mg. This pt is medically optimized.  This pt is medical risk for cardiac events in the perioperative period. Faxed to [employer] at [phone].

## 2014-07-23 ENCOUNTER — Telehealth: Payer: Self-pay

## 2014-07-23 NOTE — Telephone Encounter (Signed)
Left message for [name] [name] that our office recommends pt continue his apirin 81 mg, but if surgeon does not feel comfortable operating w/ this, he may hold at his discretion.  Asked her to call back w/ any questions or concerns.

## 2014-07-23 NOTE — Telephone Encounter (Signed)
Nurse needs to speak with someone regarding cardiac clearance. Has a question regarding pt being on aspirin. Please call.

## 2014-07-25 ENCOUNTER — Telehealth: Payer: Self-pay | Admitting: *Deleted

## 2014-07-25 DIAGNOSIS — Z01818 Encounter for other preprocedural examination: Secondary | ICD-10-CM | POA: Diagnosis not present

## 2014-07-25 NOTE — Telephone Encounter (Signed)
Please see previous phone note.  

## 2014-07-25 NOTE — Telephone Encounter (Signed)
This encounter was created in error - please disregard.

## 2014-07-25 NOTE — Telephone Encounter (Signed)
Jonette Eva at 07/25/2014 9:43 AM     Status: Signed       Expand All Collapse All   [facility] calling stating that Pt is going for pre-testing today at 1 pm  Pt is on Asprin. They are getting conflicting info just want to clarify it on this. Please advise.

## 2014-07-25 NOTE — Telephone Encounter (Signed)
Rececca from urology called stating that Dr. Susa Griffins will not do procedure while pt is on aspirin and is requesting a letter stating that pt is ok to stop this during procedure.  Advised her of Ryan's recommendation to continue aspirin 81 mg, and that operating MD may hold this at his discretion.  She states that he will not hold it unless cleared to do so by our office. s She would like a letter faxed to [facility] at [phone].

## 2014-07-25 NOTE — Telephone Encounter (Signed)
It is unclear where the "but if surgeon does not feel comfortable operating w/ this, he may hold at his discretion" is coming from when speaking of aspirin. It do not see conflicting info. I see we advised the patient to remain on aspirin 81 mg per his primary cardiologist's treating preferences in treating patient's with CAD. It would be the surgeon's decision to hold aspirin. If the surgeon uncomfortable operating with aspirin then it is their choice and solely their choice to hold. Not ours. The above statement was solely giving clarity to stating we prefer the patient to remain on aspirin, but if they want him off aspirin it's their call not ours. That call remains the same.

## 2014-07-25 NOTE — Telephone Encounter (Signed)
[facility] calling stating that Pt is going for pre-testing today at 1 pm  Pt is on Asprin. They are getting conflicting info just want to clarify it on this. Please advise.

## 2014-07-29 DIAGNOSIS — E785 Hyperlipidemia, unspecified: Secondary | ICD-10-CM | POA: Diagnosis not present

## 2014-07-29 DIAGNOSIS — R0602 Shortness of breath: Secondary | ICD-10-CM | POA: Diagnosis not present

## 2014-07-29 DIAGNOSIS — D291 Benign neoplasm of prostate: Secondary | ICD-10-CM | POA: Diagnosis not present

## 2014-07-29 DIAGNOSIS — E780 Pure hypercholesterolemia: Secondary | ICD-10-CM | POA: Diagnosis not present

## 2014-07-29 DIAGNOSIS — N320 Bladder-neck obstruction: Secondary | ICD-10-CM | POA: Diagnosis not present

## 2014-07-29 DIAGNOSIS — K219 Gastro-esophageal reflux disease without esophagitis: Secondary | ICD-10-CM | POA: Diagnosis not present

## 2014-07-29 DIAGNOSIS — E119 Type 2 diabetes mellitus without complications: Secondary | ICD-10-CM | POA: Diagnosis not present

## 2014-07-29 DIAGNOSIS — I10 Essential (primary) hypertension: Secondary | ICD-10-CM | POA: Diagnosis not present

## 2014-07-29 DIAGNOSIS — N401 Enlarged prostate with lower urinary tract symptoms: Secondary | ICD-10-CM | POA: Diagnosis not present

## 2014-07-29 DIAGNOSIS — Z7982 Long term (current) use of aspirin: Secondary | ICD-10-CM | POA: Diagnosis not present

## 2014-07-30 ENCOUNTER — Encounter: Payer: Self-pay | Admitting: Internal Medicine

## 2014-07-30 ENCOUNTER — Telehealth: Payer: Self-pay

## 2014-07-30 DIAGNOSIS — R319 Hematuria, unspecified: Secondary | ICD-10-CM | POA: Diagnosis not present

## 2014-07-30 DIAGNOSIS — Z951 Presence of aortocoronary bypass graft: Secondary | ICD-10-CM | POA: Diagnosis not present

## 2014-07-30 DIAGNOSIS — I10 Essential (primary) hypertension: Secondary | ICD-10-CM | POA: Diagnosis not present

## 2014-07-30 DIAGNOSIS — I2510 Atherosclerotic heart disease of native coronary artery without angina pectoris: Secondary | ICD-10-CM | POA: Diagnosis not present

## 2014-07-30 DIAGNOSIS — E119 Type 2 diabetes mellitus without complications: Secondary | ICD-10-CM | POA: Diagnosis not present

## 2014-07-30 DIAGNOSIS — Z96641 Presence of right artificial hip joint: Secondary | ICD-10-CM | POA: Diagnosis not present

## 2014-07-30 DIAGNOSIS — T83038A Leakage of other indwelling urethral catheter, initial encounter: Secondary | ICD-10-CM | POA: Diagnosis not present

## 2014-07-30 DIAGNOSIS — Z955 Presence of coronary angioplasty implant and graft: Secondary | ICD-10-CM | POA: Diagnosis not present

## 2014-07-30 DIAGNOSIS — T8389XA Other specified complication of genitourinary prosthetic devices, implants and grafts, initial encounter: Secondary | ICD-10-CM | POA: Diagnosis not present

## 2014-07-30 DIAGNOSIS — K219 Gastro-esophageal reflux disease without esophagitis: Secondary | ICD-10-CM | POA: Diagnosis not present

## 2014-07-30 DIAGNOSIS — Z7982 Long term (current) use of aspirin: Secondary | ICD-10-CM | POA: Diagnosis not present

## 2014-07-30 NOTE — Telephone Encounter (Signed)
Pt c/o BP issue: STAT if pt c/o blurred vision, one-sided weakness or slurred speech  1. What are your last 5 BP readings? Today 198/98, yesterday 197/97,   2. Are you having any other symptoms (ex. Dizziness, headache, blurred vision, passed out)? no  3. What is your BP issue?   Also needs a rx for support hose  Pt was at [facility] for outpatient for prostate surgery, and they suggested he call us.

## 2014-07-30 NOTE — Telephone Encounter (Signed)
Spoke w/ pt.  He reports 2 days of elevated BP and increased ankle edema.  He reports that he had to hold his lasix prior to his surgery and was told to drink as much water as possible to flush the blood after his procedure.  Pt does not check his BP and does not weigh himself daily. Pt has not obtained compression hose, as they are too expensive; he requests an rx to try to run thru ins.  Advised pt that his sx are most likely r/t the increased volume and that I recommend he resume his lasix and take an extra today if his urologist is agreeable.  He verbalizes understanding and will call back if his sx do not improve.

## 2014-08-01 DIAGNOSIS — D291 Benign neoplasm of prostate: Secondary | ICD-10-CM | POA: Diagnosis not present

## 2014-08-05 DIAGNOSIS — Z85828 Personal history of other malignant neoplasm of skin: Secondary | ICD-10-CM | POA: Diagnosis not present

## 2014-08-05 DIAGNOSIS — D225 Melanocytic nevi of trunk: Secondary | ICD-10-CM | POA: Diagnosis not present

## 2014-08-05 DIAGNOSIS — L821 Other seborrheic keratosis: Secondary | ICD-10-CM | POA: Diagnosis not present

## 2014-08-05 DIAGNOSIS — L813 Cafe au lait spots: Secondary | ICD-10-CM | POA: Diagnosis not present

## 2014-08-05 DIAGNOSIS — L570 Actinic keratosis: Secondary | ICD-10-CM | POA: Diagnosis not present

## 2014-08-11 DIAGNOSIS — D291 Benign neoplasm of prostate: Secondary | ICD-10-CM | POA: Diagnosis not present

## 2014-08-19 ENCOUNTER — Telehealth: Payer: Self-pay

## 2014-08-19 NOTE — Telephone Encounter (Signed)
Pt c/o BP issue: STAT if pt c/o blurred vision, one-sided weakness or slurred speech  1. What are your last 5 BP readings? 6/14 196/81 HR 64, 6/13 199/91 HR 64, 6/12 192/88 HR 64, 6/11 185/88 HR 67, 6/10 179/83 HR 63  2. Are you having any other symptoms (ex. Dizziness, headache, blurred vision, passed out)? no  3. What is your BP issue? high

## 2014-08-19 NOTE — Telephone Encounter (Signed)
S/w patient who states his BP has been elevated (see readings below). Went hiking yesterday and felt fine. Reports slight swelling in left leg but not as bad as it has been. Takes Lasix PRN. States he has been compliant with medications and diet. Would like to know if he needs another BP med. Also would like to know if he can take atorvastatin for cholesterol. Will forward to Dr. Rockey Situ

## 2014-08-19 NOTE — Telephone Encounter (Signed)
There are various options for blood pressure control Would add doxazosin 8 mg in the evening Would start one half pill for the first week Slow titration upwards to full pill Monitor BP and call the office with numbers

## 2014-08-20 NOTE — Telephone Encounter (Signed)
Left message for pt to call back  °

## 2014-08-21 MED ORDER — BLOOD PRESSURE MONITOR AUTOMAT DEVI: 1.0000 | Freq: Once | Status: DC

## 2014-08-21 MED ORDER — DOXAZOSIN MESYLATE 8 MG PO TABS: 8.0000 mg | ORAL_TABLET | Freq: Every evening | ORAL | Status: DC

## 2014-08-21 MED ORDER — ATORVASTATIN CALCIUM 40 MG PO TABS: 20.0000 mg | ORAL_TABLET | Freq: Every day | ORAL | Status: DC

## 2014-08-21 NOTE — Telephone Encounter (Signed)
Spoke w/ pt.  Advised him of Dr. Donivan Scull recommendation.  He verbalizes understanding and is agreeable.  He requests that an rx be sent to the pharmacy for a new BP monitor to see if ins will cover, as his monitor is several years old and has been reading high.

## 2014-09-12 DIAGNOSIS — J209 Acute bronchitis, unspecified: Secondary | ICD-10-CM | POA: Diagnosis not present

## 2014-09-22 ENCOUNTER — Other Ambulatory Visit: Payer: Self-pay | Admitting: Cardiovascular Disease

## 2014-09-23 DIAGNOSIS — D291 Benign neoplasm of prostate: Secondary | ICD-10-CM | POA: Diagnosis not present

## 2014-09-29 ENCOUNTER — Other Ambulatory Visit: Payer: Self-pay | Admitting: Cardiovascular Disease

## 2014-10-13 ENCOUNTER — Telehealth: Payer: Self-pay

## 2014-10-13 NOTE — Telephone Encounter (Signed)
Pt has a question regarding a "test, life screening deal", states it is tests they do on your arteries, wants to know if Dr. Rockey Situ thinks he should do this. Please call and advise.

## 2014-10-16 ENCOUNTER — Other Ambulatory Visit: Payer: Self-pay | Admitting: Cardiovascular Disease

## 2014-11-07 LAB — HEMOGLOBIN A1C: HEMOGLOBIN A1C: 7 % — AB (ref 4.0–6.0)

## 2014-11-11 DIAGNOSIS — N189 Chronic kidney disease, unspecified: Secondary | ICD-10-CM | POA: Diagnosis not present

## 2014-11-11 DIAGNOSIS — I129 Hypertensive chronic kidney disease with stage 1 through stage 4 chronic kidney disease, or unspecified chronic kidney disease: Secondary | ICD-10-CM | POA: Diagnosis not present

## 2014-11-11 DIAGNOSIS — E785 Hyperlipidemia, unspecified: Secondary | ICD-10-CM | POA: Diagnosis not present

## 2014-11-11 DIAGNOSIS — E1140 Type 2 diabetes mellitus with diabetic neuropathy, unspecified: Secondary | ICD-10-CM | POA: Diagnosis not present

## 2014-11-11 DIAGNOSIS — E1141 Type 2 diabetes mellitus with diabetic mononeuropathy: Secondary | ICD-10-CM | POA: Diagnosis not present

## 2014-11-11 DIAGNOSIS — I2510 Atherosclerotic heart disease of native coronary artery without angina pectoris: Secondary | ICD-10-CM | POA: Diagnosis not present

## 2014-11-11 DIAGNOSIS — E118 Type 2 diabetes mellitus with unspecified complications: Secondary | ICD-10-CM | POA: Diagnosis not present

## 2014-11-11 DIAGNOSIS — E1121 Type 2 diabetes mellitus with diabetic nephropathy: Secondary | ICD-10-CM | POA: Diagnosis not present

## 2014-11-11 DIAGNOSIS — E669 Obesity, unspecified: Secondary | ICD-10-CM | POA: Diagnosis not present

## 2014-11-17 DIAGNOSIS — N400 Enlarged prostate without lower urinary tract symptoms: Secondary | ICD-10-CM | POA: Diagnosis not present

## 2014-11-20 DIAGNOSIS — M4806 Spinal stenosis, lumbar region: Secondary | ICD-10-CM | POA: Diagnosis not present

## 2014-11-20 DIAGNOSIS — I158 Other secondary hypertension: Secondary | ICD-10-CM | POA: Diagnosis not present

## 2014-11-20 DIAGNOSIS — E663 Overweight: Secondary | ICD-10-CM | POA: Diagnosis not present

## 2014-11-24 ENCOUNTER — Other Ambulatory Visit: Payer: Self-pay | Admitting: Cardiovascular Disease

## 2014-12-15 DIAGNOSIS — H2512 Age-related nuclear cataract, left eye: Secondary | ICD-10-CM | POA: Diagnosis not present

## 2014-12-31 ENCOUNTER — Ambulatory Visit: Payer: Medicare Other | Admitting: Cardiovascular Disease

## 2015-01-08 DIAGNOSIS — I158 Other secondary hypertension: Secondary | ICD-10-CM | POA: Diagnosis not present

## 2015-01-08 DIAGNOSIS — M5136 Other intervertebral disc degeneration, lumbar region: Secondary | ICD-10-CM | POA: Diagnosis not present

## 2015-01-08 DIAGNOSIS — M47816 Spondylosis without myelopathy or radiculopathy, lumbar region: Secondary | ICD-10-CM | POA: Diagnosis not present

## 2015-01-08 DIAGNOSIS — M4806 Spinal stenosis, lumbar region: Secondary | ICD-10-CM | POA: Diagnosis not present

## 2015-01-08 DIAGNOSIS — E663 Overweight: Secondary | ICD-10-CM | POA: Diagnosis not present

## 2015-01-16 LAB — HM DIABETES EYE EXAM

## 2015-02-06 ENCOUNTER — Ambulatory Visit (INDEPENDENT_AMBULATORY_CARE_PROVIDER_SITE_OTHER): Payer: Medicare Other | Admitting: Internal Medicine

## 2015-02-06 ENCOUNTER — Encounter: Payer: Self-pay | Admitting: Internal Medicine

## 2015-02-06 ENCOUNTER — Ambulatory Visit (INDEPENDENT_AMBULATORY_CARE_PROVIDER_SITE_OTHER): Payer: Medicare Other | Admitting: Cardiovascular Disease

## 2015-02-06 ENCOUNTER — Encounter: Payer: Self-pay | Admitting: Cardiovascular Disease

## 2015-02-06 VITALS — BP 122/60 | HR 66 | Temp 98.6°F | Ht 74.0 in | Wt 222.0 lb

## 2015-02-06 VITALS — BP 134/60 | HR 64 | Ht 73.0 in | Wt 222.5 lb

## 2015-02-06 DIAGNOSIS — I10 Essential (primary) hypertension: Secondary | ICD-10-CM

## 2015-02-06 DIAGNOSIS — E0822 Diabetes mellitus due to underlying condition with diabetic chronic kidney disease: Secondary | ICD-10-CM | POA: Diagnosis not present

## 2015-02-06 DIAGNOSIS — I2510 Atherosclerotic heart disease of native coronary artery without angina pectoris: Secondary | ICD-10-CM

## 2015-02-06 DIAGNOSIS — I5032 Chronic diastolic (congestive) heart failure: Secondary | ICD-10-CM

## 2015-02-06 DIAGNOSIS — M8949 Other hypertrophic osteoarthropathy, multiple sites: Secondary | ICD-10-CM

## 2015-02-06 DIAGNOSIS — N183 Chronic kidney disease, stage 3 unspecified: Secondary | ICD-10-CM

## 2015-02-06 DIAGNOSIS — D696 Thrombocytopenia, unspecified: Secondary | ICD-10-CM | POA: Diagnosis not present

## 2015-02-06 DIAGNOSIS — M150 Primary generalized (osteo)arthritis: Secondary | ICD-10-CM

## 2015-02-06 DIAGNOSIS — Z7189 Other specified counseling: Secondary | ICD-10-CM

## 2015-02-06 DIAGNOSIS — Z0000 Encounter for general adult medical examination without abnormal findings: Secondary | ICD-10-CM

## 2015-02-06 DIAGNOSIS — E118 Type 2 diabetes mellitus with unspecified complications: Secondary | ICD-10-CM | POA: Diagnosis not present

## 2015-02-06 DIAGNOSIS — M159 Polyosteoarthritis, unspecified: Secondary | ICD-10-CM

## 2015-02-06 DIAGNOSIS — N400 Enlarged prostate without lower urinary tract symptoms: Secondary | ICD-10-CM

## 2015-02-06 DIAGNOSIS — E785 Hyperlipidemia, unspecified: Secondary | ICD-10-CM

## 2015-02-06 DIAGNOSIS — E119 Type 2 diabetes mellitus without complications: Secondary | ICD-10-CM | POA: Insufficient documentation

## 2015-02-06 LAB — COMPREHENSIVE METABOLIC PANEL
ALT: 19 U/L (ref 0–53)
AST: 19 U/L (ref 0–37)
Albumin: 3.7 g/dL (ref 3.5–5.2)
Alkaline Phosphatase: 131 U/L — ABNORMAL HIGH (ref 39–117)
BILIRUBIN TOTAL: 1.1 mg/dL (ref 0.2–1.2)
BUN: 20 mg/dL (ref 6–23)
CALCIUM: 9 mg/dL (ref 8.4–10.5)
CO2: 26 meq/L (ref 19–32)
CREATININE: 1.19 mg/dL (ref 0.40–1.50)
Chloride: 105 mEq/L (ref 96–112)
GFR: 62.63 mL/min (ref >60.00)
GLUCOSE: 203 mg/dL — AB (ref 70–99)
Potassium: 4.1 mEq/L (ref 3.5–5.1)
SODIUM: 138 meq/L (ref 135–145)
Total Protein: 6.4 g/dL (ref 6.0–8.3)

## 2015-02-06 LAB — CBC WITH DIFFERENTIAL/PLATELET
BASOS ABS: 0 10*3/uL (ref 0.0–0.1)
Basophils Relative: 0.6 % (ref 0.0–3.0)
EOS ABS: 0.3 10*3/uL (ref 0.0–0.7)
Eosinophils Relative: 4.8 % (ref 0.0–5.0)
HEMATOCRIT: 42.7 % (ref 39.0–52.0)
HEMOGLOBIN: 14.5 g/dL (ref 13.0–17.0)
LYMPHS PCT: 24.3 % (ref 12.0–46.0)
Lymphs Abs: 1.5 10*3/uL (ref 0.7–4.0)
MCHC: 33.9 g/dL (ref 30.0–36.0)
MCV: 94.8 fl (ref 78.0–100.0)
Monocytes Absolute: 0.5 10*3/uL (ref 0.1–1.0)
Monocytes Relative: 8.6 % (ref 3.0–12.0)
NEUTROS ABS: 3.9 10*3/uL (ref 1.4–7.7)
Neutrophils Relative %: 61.7 % (ref 43.0–77.0)
PLATELETS: 117 10*3/uL — AB (ref 150.0–400.0)
RBC: 4.51 Mil/uL (ref 4.22–5.81)
RDW: 13 % (ref 11.5–15.5)
WBC: 6.3 10*3/uL (ref 4.0–10.5)

## 2015-02-06 LAB — LIPID PANEL
CHOLESTEROL: 111 mg/dL (ref 0–200)
HDL: 31.9 mg/dL — AB (ref >39.00)
LDL Cholesterol: 61 mg/dL (ref 0–99)
NonHDL: 79.25
Total CHOL/HDL Ratio: 3
Triglycerides: 92 mg/dL (ref 0.0–149.0)
VLDL: 18.4 mg/dL (ref 0.0–40.0)

## 2015-02-06 LAB — HM DIABETES FOOT EXAM

## 2015-02-06 MED ORDER — MELOXICAM 15 MG PO TABS: 15.0000 mg | ORAL_TABLET | Freq: Every day | ORAL | Status: DC | PRN

## 2015-02-06 NOTE — Assessment & Plan Note (Signed)
Thickened hand joints without active synovitis Will try meloxicam--- cheaper than celebrex and recommended by his current ortho He understands mildly increased cardiac risk

## 2015-02-06 NOTE — Patient Instructions (Signed)
If you have trouble sleeping, you can try melatonin--- try 2-3mg  or up to 5-6mg  nightly.

## 2015-02-06 NOTE — Assessment & Plan Note (Signed)
Good control No changes needed Lab Results  Component Value Date   HGBA1C 7.0* 11/07/2014

## 2015-02-06 NOTE — Assessment & Plan Note (Signed)
Blood pressure is well controlled on today's visit. No changes made to the medications. 

## 2015-02-06 NOTE — Patient Instructions (Signed)
You are doing well. No medication changes were made.  Please call us if you have new issues that need to be addressed before your next appt.  Your physician wants you to follow-up in: 6 months.  You will receive a reminder letter in the mail two months in advance. If you don't receive a letter, please call our office to schedule the follow-up appointment.   

## 2015-02-06 NOTE — Assessment & Plan Note (Signed)
Better since procedure but still on meds

## 2015-02-06 NOTE — Progress Notes (Signed)
Patient ID: [name] [name], male    DOB: [birth year], 81 y.o.   MRN: [id number]  HPI Comments: 81 year old gentleman with a history of coronary artery disease, bypass surgery in November 2006, history of diabetes, hyperlipidemia, hypertension, erectile dysfunction who presents for routine followup of his coronary artery disease  In follow-up today, he reports that he is doing well. He is doing exercise, not as frequently as before. Some biking, some treadmill Wife is concerned that he has shortness of breath on exertion. He is troubled by back pain, hip pain. Breathing has not changed over the past several years.  She is watching his diet closely. He is frustrated that he is not losing more of his abdominal weight  Recent lab work done today shows total cholesterol 111, LDL 61 Hemoglobin A1c 7.0, platelets 117  Reports he saw specialist for his back in [place], had MRI showing arthritis no spinal stenosis He is considering starting meloxicam Was previously on Celebrex. This is more expensive  Denies any shortness of breath or chest pain with exertion concerning for angina  EKG on today's visit shows normal sinus rhythm with right bundle branch block, rate 64 bpm  Other past medical history Previously took a trip [place] with long car trip for 10 hours x2 days each way. He had significant lower extremity edema. He took additional doses of Lasix with improvement of his swelling. He reports having chronic swelling of the left lower extremity, swelling of the right lower extremity has resolved. Doppler of the lower extremity on the left showed no DVT  Periodically has subxiphoid pain that is sharp, unclear if it is associated with food. No longer taking a statin as this caused leg cramps He continues to bike 3-6 miles per day. Unable to walk long distances secondary to chronic knee and hip pain  Hemoglobin A1c 6.7.   participating in a study at [facility] where they manage his insulin .  6 Total cholesterol 112, LDL 61 with no cholesterol medication  catheterization for chest pain 06/05/2013 that showed patent grafts with LIMA to the LAD, vein graft to the diagonal, vein graft to the OM 3, vein graft to the distal RCA. Normal filling pressures and normal LV function.  Echocardiogram April 2009 shows normal systolic function with mild LVH, diastolic relaxation abnormality, mildly enlarged left atrium, mild aortic insufficiency. Cardiac CT scan in March 2009 showed severe coronary artery disease with patent grafts x4, saphenous vein graft to the PDA, OM, diagonal and a LIMA graft to the LAD.  No Known Allergies  Outpatient Encounter Prescriptions as of 02/06/2015  Medication Sig  . acetaminophen (TYLENOL) 650 MG CR tablet Take 650 mg by mouth every 8 (eight) hours as needed for pain. For hand arthritis  . alfuzosin (UROXATRAL) 10 MG 24 hr tablet Take 10 mg by mouth daily with breakfast.  . Ascorbic Acid (VITAMIN C) 100 MG tablet Take 100 mg by mouth daily.  Marland Kitchen aspirin 81 MG tablet Take 162 mg by mouth daily.   Marland Kitchen atorvastatin (LIPITOR) 40 MG tablet TAKE 1 TABLET EVERY DAY AS DIRECTED (Patient taking differently: TAKE 0.5 TABLET EVERY OTHER DAY.)  . Biotin 1000 MCG tablet Take 1,000 mcg by mouth daily.  . Blood Pressure Monitoring (BLOOD PRESSURE MONITOR AUTOMAT) DEVI 1 Device by Does not apply route once.  . carvedilol (COREG) 12.5 MG tablet TAKE ONE TABLET TWICE A DAY WITH MEALS.  . finasteride (PROSCAR) 5 MG tablet Take 1 tablet (5 mg total) by mouth daily.  Marland Kitchen  furosemide (LASIX) 20 MG tablet TAKE 1 TABLET EVERY DAY AS NEEDED  . glimepiride (AMARYL) 1 MG tablet TAKE 1/2 TABLET AM DAILY.  Marland Kitchen Glucosamine-Chondroit-Vit C-Mn (GLUCOSAMINE CHONDR 1500 COMPLX) CAPS Take by mouth daily.  Marland Kitchen LANTUS 100 UNIT/ML injection Inject 36 Units into the skin daily.   . Multiple Vitamin (MULTIVITAMIN) tablet Take 1 tablet by mouth daily.  Marland Kitchen omeprazole (PRILOSEC) 20 MG capsule Take 20 mg by mouth 2  (two) times daily before a meal.   . ramipril (ALTACE) 10 MG capsule TAKE ONE CAPSULE DAILY  . [DISCONTINUED] doxazosin (CARDURA) 8 MG tablet Take 1 tablet (8 mg total) by mouth every evening. (Patient not taking: Reported on 02/06/2015)  . [DISCONTINUED] meloxicam (MOBIC) 15 MG tablet Take 1 tablet (15 mg total) by mouth daily as needed for pain. (Patient not taking: Reported on 02/06/2015)  . [DISCONTINUED] Potassium Gluconate 595 MG CAPS Take by mouth daily.   No facility-administered encounter medications on file as of 02/06/2015.    Past Medical History  Diagnosis Date  . Coronary artery disease     a. 4v CABG 01/2005; b. cath 06/05/13 showed patent grafts: LIMA-LAD, VG-D, VG-OM3, VG-dRCA, nl filling pressures, nl LV fxn  . Hyperlipidemia   . Hypertension   . IDDM (insulin dependent diabetes mellitus) ([place]) 2000  . Hypertrophy of prostate without urinary obstruction and other lower urinary tract symptoms (LUTS)   . GERD (gastroesophageal reflux disease)   . Perirectal fistula   . Cataract   . ED (erectile dysfunction)   . Osteoarthritis   . Diastolic dysfunction     a. echo 0000000: nl systolic fxn, mild LVH, diastolic relaxation abnormality, mildly enlarged LA, mild Ao insufficiency    Past Surgical History  Procedure Laterality Date  . Coronary artery bypass graft  2007    x 4  . Foot surgery  2012    right foot  . Lumbar laminectomy  1989  . Joint replacement  8/10    Right THR--[name]  . Colonoscopy w/ polypectomy    . Cholecystectomy  05/15/2011    Procedure: LAPAROSCOPIC CHOLECYSTECTOMY;  Surgeon: Rolm Bookbinder, MD;  Location: [facility];  Service: General;  Laterality: N/A;  . Cataract extraction  sept 2013    right  . Cardiac [facility]  [facility]   Marland Kitchen Left and right heart catheterization with coronary/graft angiogram N/A 06/05/2013    Procedure: LEFT AND RIGHT HEART CATHETERIZATION WITH Beatrix Fetters;  Surgeon: Peter M Martinique, MD;   Location: [facility] CATH LAB;  Service: Cardiovascular;  Laterality: N/A;  . Prostate photovaporization  5/16    Dr Budd Palmer    Social History  reports that he has never smoked. He has never used smokeless tobacco. He reports that he drinks alcohol. He reports that he does not use illicit drugs.  Family History family history includes Heart disease in his father; Lung cancer in his mother. There is no history of Colon cancer.   Review of Systems  Constitutional: Negative.   Respiratory: Positive for shortness of breath.   Cardiovascular: Positive for leg swelling.       Swelling of the left leg  Gastrointestinal: Negative.   Musculoskeletal: Positive for arthralgias.  Skin: Negative.   Neurological: Negative.   Hematological: Negative.   Psychiatric/Behavioral: Negative.   All other systems reviewed and are negative.   BP 134/60 mmHg  Pulse 64  Ht 6\' 1"  (1.854 m)  Wt 222 lb 8 oz (100.925 kg)  BMI 29.36 kg/m2  Physical Exam  Constitutional: He is oriented to person, place, and time. He appears well-developed and well-nourished.  Obese  HENT:  Head: Normocephalic.  Nose: Nose normal.  Mouth/Throat: Oropharynx is clear and moist.  Eyes: Conjunctivae are normal. Pupils are equal, round, and reactive to light.  Neck: Normal range of motion. Neck supple. No JVD present.  Cardiovascular: Normal rate, regular rhythm, S1 normal, S2 normal, normal heart sounds and intact distal pulses.  Exam reveals no gallop and no friction rub.   No murmur heard. Trace edema extending half for a up the shin on the left, no significant edema on the right  Pulmonary/Chest: Effort normal and breath sounds normal. No respiratory distress. He has no wheezes. He has no rales. He exhibits no tenderness.  Abdominal: Soft. Bowel sounds are normal. He exhibits no distension. There is no tenderness.  Musculoskeletal: Normal range of motion. He exhibits no edema or tenderness.  Lymphadenopathy:    He has no  cervical adenopathy.  Neurological: He is alert and oriented to person, place, and time. Coordination normal.  Skin: Skin is warm and dry. No rash noted. No erythema.  Psychiatric: He has a normal mood and affect. His behavior is normal. Judgment and thought content normal.      Assessment and Plan   Nursing note and vitals reviewed.

## 2015-02-06 NOTE — Assessment & Plan Note (Signed)
Cholesterol is at goal on the current lipid regimen. No changes to the medications were made.  

## 2015-02-06 NOTE — Assessment & Plan Note (Signed)
We have encouraged continued exercise, careful diet management   

## 2015-02-06 NOTE — Progress Notes (Signed)
Pre visit review using our clinic review tool, if applicable. No additional management support is needed unless otherwise documented below in the visit note. 

## 2015-02-06 NOTE — Assessment & Plan Note (Signed)
Currently with no symptoms of angina. No further workup at this time. Continue current medication regimen. Prior stress test in 2014 for shortness of breath showing no ischemia

## 2015-02-06 NOTE — Assessment & Plan Note (Signed)
See social history 

## 2015-02-06 NOTE — Assessment & Plan Note (Signed)
I have personally reviewed the Medicare Annual Wellness questionnaire and have noted 1. The patient's medical and social history 2. Their use of alcohol, tobacco or illicit drugs 3. Their current medications and supplements 4. The patient's functional ability including ADL's, fall risks, home safety risks and hearing or visual             impairment. 5. Diet and physical activities 6. Evidence for depression or mood disorders  The patients weight, height, BMI and visual acuity have been recorded in the chart I have made referrals, counseling and provided education to the patient based review of the above and I have provided the pt with a written personalized care plan for preventive services.  I have provided you with a copy of your personalized plan for preventive services. Please take the time to review along with your updated medication list.  UTD on immunizations No cancer screening due to age Discussed fitness and weight loss

## 2015-02-06 NOTE — Assessment & Plan Note (Signed)
Compensated on current meds No change needed

## 2015-02-06 NOTE — Progress Notes (Signed)
Subjective:    Patient ID: [name] [name], male    DOB: [birth year], 81 y.o.   MRN: [id number]  HPI Here for Medicare wellness and follow up of chronic medical conditions Reviewed form and advanced directives Reviewed other doctors Had green laser treatment for prostate enlargement --4/16 Vision better with new glasses Hearing okay Occasional cocktail or glass of wine No tobacco Exercising regularly-- bikes for 30-60 minutes No falls No depression or anhedonia Independent with instrumental ADLs No significant memory issues  Voids better now but still slow stream Less nocturia but still gets up Still on finasteride----off oxybutynin  Was going through study for diabetes at [facility]--this just finished Continued follow up there No numbness, sores or pain in feet Eye exam 3 weeks ago--Porfilio. No retinopathy.   Ran out of celebrex so off this now Had MRI on back in [name]--disc disease without spinal stenosis That doctor recommended mobic instead Hands are really affected--hard to even open a water bottle now  No chest pain Mild intermittent edema-- diuretic helps No palpitations No dizziness or syncope No SOB--stable exercise tolerance. DOE if walks carrying heavy object.  Current Outpatient Prescriptions on File Prior to Visit  Medication Sig Dispense Refill  . acetaminophen (TYLENOL) 650 MG CR tablet Take 650 mg by mouth every 8 (eight) hours as needed for pain. For hand arthritis    . alfuzosin (UROXATRAL) 10 MG 24 hr tablet Take 10 mg by mouth daily with breakfast.    . Ascorbic Acid (VITAMIN C) 100 MG tablet Take 100 mg by mouth daily.    Marland Kitchen aspirin 81 MG tablet Take 162 mg by mouth daily.     Marland Kitchen atorvastatin (LIPITOR) 40 MG tablet TAKE 1 TABLET EVERY DAY AS DIRECTED 90 tablet 3  . Biotin 1000 MCG tablet Take 1,000 mcg by mouth daily.    . Blood Pressure Monitoring (BLOOD PRESSURE MONITOR AUTOMAT) DEVI 1 Device by Does not apply route once. 1 Device 0  . carvedilol  (COREG) 12.5 MG tablet TAKE ONE TABLET TWICE A DAY WITH MEALS. 180 tablet 3  . doxazosin (CARDURA) 8 MG tablet Take 1 tablet (8 mg total) by mouth every evening. 30 tablet 6  . finasteride (PROSCAR) 5 MG tablet Take 1 tablet (5 mg total) by mouth daily. 90 tablet 1  . furosemide (LASIX) 20 MG tablet TAKE 1 TABLET EVERY DAY AS NEEDED 30 tablet 3  . glimepiride (AMARYL) 1 MG tablet TAKE 1/2 TABLET AM AND PM DAILY.    Marland Kitchen Glucosamine-Chondroit-Vit C-Mn (GLUCOSAMINE CHONDR 1500 COMPLX) CAPS Take by mouth daily.    . Multiple Vitamin (MULTIVITAMIN) tablet Take 1 tablet by mouth daily.    Marland Kitchen omeprazole (PRILOSEC) 20 MG capsule Take 20 mg by mouth 2 (two) times daily before a meal.     . Potassium Gluconate 595 MG CAPS Take by mouth daily.    . ramipril (ALTACE) 10 MG capsule TAKE ONE CAPSULE DAILY 30 capsule 6   No current facility-administered medications on file prior to visit.    No Known Allergies  Past Medical History  Diagnosis Date  . Coronary artery disease     a. 4v CABG 01/2005; b. cath 06/05/13 showed patent grafts: LIMA-LAD, VG-D, VG-OM3, VG-dRCA, nl filling pressures, nl LV fxn  . Hyperlipidemia   . Hypertension   . IDDM (insulin dependent diabetes mellitus) ([place]) 2000  . Hypertrophy of prostate without urinary obstruction and other lower urinary tract symptoms (LUTS)   . GERD (gastroesophageal reflux disease)   .  Perirectal fistula   . Cataract   . ED (erectile dysfunction)   . Osteoarthritis   . Diastolic dysfunction     a. echo 0000000: nl systolic fxn, mild LVH, diastolic relaxation abnormality, mildly enlarged LA, mild Ao insufficiency    Past Surgical History  Procedure Laterality Date  . Coronary artery bypass graft  2007    x 4  . Foot surgery  2012    right foot  . Lumbar laminectomy  1989  . Joint replacement  8/10    Right THR--[name]  . Colonoscopy w/ polypectomy    . Cholecystectomy  05/15/2011    Procedure: LAPAROSCOPIC CHOLECYSTECTOMY;  Surgeon: Rolm Bookbinder, MD;  Location: [facility];  Service: General;  Laterality: N/A;  . Cataract extraction  sept 2013    right  . Cardiac catheterization  06/[facility]    [facility]   Marland Kitchen Left and right heart catheterization with coronary/graft angiogram N/A 06/05/2013    Procedure: LEFT AND RIGHT HEART CATHETERIZATION WITH Beatrix Fetters;  Surgeon: Peter M Martinique, MD;  Location: [facility] CATH LAB;  Service: Cardiovascular;  Laterality: N/A;  . Prostate photovaporization  5/16    Dr Budd Palmer    Family History  Problem Relation Age of Onset  . Lung cancer Mother   . Heart disease Father   . Colon cancer Neg Hx     Social History   Social History  . Marital Status: Divorced    Spouse Name: N/A  . Number of Children: 4  . Years of Education: N/A   Occupational History  . 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 Smoking status: Never Smoker   . Smokeless tobacco: Never Used  . Alcohol Use: Yes     Comment: occ cocktail  . Drug Use: No  . Sexual Activity: Not on file   Other Topics Concern  . Not on file   Social History Narrative   Goes by [name]   Has living will    Has designated Aletha Halim health care POA   Would accept resuscitation attempts   Would not want tube feeds if cognitively unaware   Review of Systems Some sleep problems---trouble reinitiating after nocturia. Discussed melatonin. Appetite is okay Weight stable Wears seat belt Only 6 lower teeth--they are okay. Hasn't seen dentist much Bowels are okay No rashes or suspicious lesions. Does go to derm yearly No heartburn on omeprazole. No dysphagia    Objective:   Physical Exam  Constitutional: He is oriented to person, place, and time. He appears well-developed and well-nourished. No distress.  HENT:  Mouth/Throat: Oropharynx is clear and moist. No oropharyngeal exudate.  Neck: Normal range of motion. Neck supple. No thyromegaly present.    Cardiovascular: Normal rate, regular rhythm, normal heart sounds and intact distal pulses.  Exam reveals no gallop.   No murmur heard. Pulmonary/Chest: Effort normal and breath sounds normal. No respiratory distress. He has no wheezes. He has no rales.  Abdominal: Soft. There is no tenderness.  Musculoskeletal: He exhibits no edema or tenderness.  Lymphadenopathy:    He has no cervical adenopathy.  Neurological: He is alert and oriented to person, place, and time.  President-- "Mady Gemma, Maudie Flakes, Bill Clinton" [phone] [id number] Recall 3/3  Normal sensation in feet  Skin: No rash noted. No erythema.  No foot lesions  Psychiatric: He has a normal mood and affect. His behavior is normal.          Assessment &  Plan:

## 2015-02-06 NOTE — Assessment & Plan Note (Signed)
BP Readings from Last 3 Encounters:  02/06/15 122/60  07/01/14 140/68  02/05/14 140/70   Good control

## 2015-02-06 NOTE — Addendum Note (Signed)
Addended by: Daralene Milch C on: 02/06/2015 10:11 AM   Modules accepted: SmartSet

## 2015-02-06 NOTE — Assessment & Plan Note (Signed)
No significant edema on today's visit, relatively normal renal function As he is starting meloxicam, recommended he take his Lasix every other day

## 2015-02-06 NOTE — Assessment & Plan Note (Signed)
Borderline CKD 3 On ACEI Will recheck

## 2015-02-07 DIAGNOSIS — D696 Thrombocytopenia, unspecified: Secondary | ICD-10-CM | POA: Insufficient documentation

## 2015-02-07 NOTE — Assessment & Plan Note (Signed)
Mild and persistent No action needed unless much lower

## 2015-02-07 NOTE — Progress Notes (Signed)
   Subjective:    Patient ID: [name] [name], male    DOB: 02/04/1934, 81 y.o.   MRN: [id number]  HPI No abnormal bleeding   Review of Systems     Objective:   Physical Exam        Assessment & Plan:

## 2015-02-10 ENCOUNTER — Encounter: Payer: Self-pay | Admitting: *Deleted

## 2015-02-10 DIAGNOSIS — E118 Type 2 diabetes mellitus with unspecified complications: Secondary | ICD-10-CM | POA: Diagnosis not present

## 2015-02-10 DIAGNOSIS — E119 Type 2 diabetes mellitus without complications: Secondary | ICD-10-CM | POA: Diagnosis not present

## 2015-02-10 DIAGNOSIS — Z794 Long term (current) use of insulin: Secondary | ICD-10-CM | POA: Diagnosis not present

## 2015-02-19 ENCOUNTER — Other Ambulatory Visit: Payer: Self-pay | Admitting: Internal Medicine

## 2015-03-09 ENCOUNTER — Emergency Department
Admission: EM | Admit: 2015-03-09 | Discharge: 2015-03-09 | Disposition: A | Discharge status: Home / Self Care | Payer: Medicare Other | Attending: Emergency Medicine | Admitting: Emergency Medicine

## 2015-03-09 ENCOUNTER — Encounter: Payer: Self-pay | Admitting: Emergency Medicine

## 2015-03-09 ENCOUNTER — Emergency Department: Payer: Medicare Other

## 2015-03-09 DIAGNOSIS — Z79899 Other long term (current) drug therapy: Secondary | ICD-10-CM | POA: Diagnosis not present

## 2015-03-09 DIAGNOSIS — I10 Essential (primary) hypertension: Secondary | ICD-10-CM | POA: Diagnosis not present

## 2015-03-09 DIAGNOSIS — N189 Chronic kidney disease, unspecified: Secondary | ICD-10-CM | POA: Insufficient documentation

## 2015-03-09 DIAGNOSIS — E1122 Type 2 diabetes mellitus with diabetic chronic kidney disease: Secondary | ICD-10-CM | POA: Insufficient documentation

## 2015-03-09 DIAGNOSIS — R109 Unspecified abdominal pain: Secondary | ICD-10-CM | POA: Diagnosis present

## 2015-03-09 DIAGNOSIS — R1032 Left lower quadrant pain: Secondary | ICD-10-CM | POA: Diagnosis not present

## 2015-03-09 DIAGNOSIS — R1031 Right lower quadrant pain: Secondary | ICD-10-CM | POA: Diagnosis not present

## 2015-03-09 DIAGNOSIS — Z7982 Long term (current) use of aspirin: Secondary | ICD-10-CM | POA: Insufficient documentation

## 2015-03-09 DIAGNOSIS — I129 Hypertensive chronic kidney disease with stage 1 through stage 4 chronic kidney disease, or unspecified chronic kidney disease: Secondary | ICD-10-CM | POA: Insufficient documentation

## 2015-03-09 DIAGNOSIS — R1030 Lower abdominal pain, unspecified: Secondary | ICD-10-CM

## 2015-03-09 LAB — COMPREHENSIVE METABOLIC PANEL
ALT: 18 U/L (ref 17–63)
ANION GAP: 6 (ref 5–15)
AST: 20 U/L (ref 15–41)
Albumin: 3.8 g/dL (ref 3.5–5.0)
Alkaline Phosphatase: 104 U/L (ref 38–126)
BUN: 20 mg/dL (ref 6–20)
CHLORIDE: 108 mmol/L (ref 101–111)
CO2: 25 mmol/L (ref 22–32)
CREATININE: 1.17 mg/dL (ref 0.61–1.24)
Calcium: 8.8 mg/dL — ABNORMAL LOW (ref 8.9–10.3)
GFR, EST NON AFRICAN AMERICAN: 57 mL/min — AB (ref >60)
Glucose, Bld: 287 mg/dL — ABNORMAL HIGH (ref 65–99)
POTASSIUM: 4.3 mmol/L (ref 3.5–5.1)
Sodium: 139 mmol/L (ref 135–145)
Total Bilirubin: 1.2 mg/dL (ref 0.3–1.2)
Total Protein: 6.2 g/dL — ABNORMAL LOW (ref 6.5–8.1)

## 2015-03-09 LAB — URINALYSIS COMPLETE WITH MICROSCOPIC (ARMC ONLY)
BILIRUBIN URINE: NEGATIVE
Bacteria, UA: NONE SEEN
Glucose, UA: NEGATIVE mg/dL
HGB URINE DIPSTICK: NEGATIVE
Ketones, ur: NEGATIVE mg/dL
LEUKOCYTES UA: NEGATIVE
Nitrite: NEGATIVE
PH: 5 (ref 5.0–8.0)
Protein, ur: NEGATIVE mg/dL
SQUAMOUS EPITHELIAL / LPF: NONE SEEN
Specific Gravity, Urine: 1.014 (ref 1.005–1.030)

## 2015-03-09 LAB — CBC
HEMATOCRIT: 40.6 % (ref 40.0–52.0)
HEMOGLOBIN: 14.4 g/dL (ref 13.0–18.0)
MCH: 32.1 pg (ref 26.0–34.0)
MCHC: 35.5 g/dL (ref 32.0–36.0)
MCV: 90.4 fL (ref 80.0–100.0)
PLATELETS: 98 10*3/uL — AB (ref 150–440)
RBC: 4.49 MIL/uL (ref 4.40–5.90)
RDW: 13.1 % (ref 11.5–14.5)
WBC: 6.3 10*3/uL (ref 3.8–10.6)

## 2015-03-09 LAB — LIPASE, BLOOD: LIPASE: 21 U/L (ref 11–51)

## 2015-03-09 MED ORDER — TRAMADOL HCL 50 MG PO TABS: 50.0000 mg | ORAL_TABLET | Freq: Four times a day (QID) | ORAL | Status: DC | PRN

## 2015-03-09 MED ORDER — IOHEXOL 240 MG/ML SOLN
25.0000 mL | Freq: Once | INTRAMUSCULAR | Status: AC | PRN
  Administered 2015-03-09: 25 mL via ORAL

## 2015-03-09 MED ORDER — IOHEXOL 300 MG/ML  SOLN
100.0000 mL | Freq: Once | INTRAMUSCULAR | Status: AC | PRN
  Administered 2015-03-09: 100 mL via INTRAVENOUS

## 2015-03-09 NOTE — ED Notes (Signed)
Pt transported to CT ?

## 2015-03-09 NOTE — ED Provider Notes (Addendum)
[facility] Emergency Department Provider Note  ____________________________________________  Time seen: 2:10 PM  I have reviewed the triage vital signs and the nursing notes.   HISTORY  Chief Complaint Abdominal Pain    HPI [name] [name] is a 82 y.o. male who presents with complaints of abdominal pain for approximately 4 days. Patient reports his pain is mild to moderate but unrelenting. The pain is barely the left lower quadrant and right lower quadrant. Does report a history of cholecystectomy. He denies fevers or chills. No nausea or vomiting. Normal stools. No injury to the area. He hasn't had this before. He has not taken any medication for it.     Past Medical History  Diagnosis Date  . Coronary artery disease     a. 4v CABG 01/2005; b. cath 06/05/13 showed patent grafts: LIMA-LAD, VG-D, VG-OM3, VG-dRCA, nl filling pressures, nl LV fxn  . Hyperlipidemia   . Hypertension   . IDDM (insulin dependent diabetes mellitus) ([place]) 2000  . Hypertrophy of prostate without urinary obstruction and other lower urinary tract symptoms (LUTS)   . GERD (gastroesophageal reflux disease)   . Perirectal fistula   . Cataract   . ED (erectile dysfunction)   . Osteoarthritis   . Diastolic dysfunction     a. echo 0000000: nl systolic fxn, mild LVH, diastolic relaxation abnormality, mildly enlarged LA, mild Ao insufficiency    Patient Active Problem List   Diagnosis Date Noted  . Thrombocytopenia ([place]) 02/07/2015  . Advance directive discussed with patient 02/06/2015  . Diabetes mellitus with chronic kidney disease ([place]) 02/06/2015  . Diastolic dysfunction   . Chronic diastolic CHF (congestive heart failure) ([place]) 01/01/2014  . CAD (coronary artery disease) 06/03/2013  . Nocturnal leg cramps 01/24/2013  . Routine general medical examination at a health care facility 06/13/2012  . S/P CABG (coronary artery bypass graft) 05/11/2011  . Diabetes mellitus type 2,  controlled, with complications ([place]) [id number]  . Osteoarthrosis involving multiple sites 09/07/2010  . BPH (benign prostatic hypertrophy)   . GERD (gastroesophageal reflux disease)   . Hyperlipidemia 09/15/2009  . HYPERTENSION, BENIGN 09/15/2009  . Coronary atherosclerosis of native coronary artery 09/15/2009    Past Surgical History  Procedure Laterality Date  . Coronary artery bypass graft  2007    x 4  . Foot surgery  2012    right foot  . Lumbar laminectomy  1989  . Joint replacement  8/10    Right THR--[name]  . Colonoscopy w/ polypectomy    . Cholecystectomy  05/15/2011    Procedure: LAPAROSCOPIC CHOLECYSTECTOMY;  Surgeon: Rolm Bookbinder, MD;  Location: [place];  Service: General;  Laterality: N/A;  . Cataract extraction  sept 2013    right  . Cardiac [facility]  06/[facility]   Marland Kitchen Left and right heart catheterization with coronary/graft angiogram N/A 06/05/2013    Procedure: LEFT AND RIGHT HEART CATHETERIZATION WITH Beatrix Fetters;  Surgeon: Peter M Martinique, MD;  Location: [facility] CATH LAB;  Service: Cardiovascular;  Laterality: N/A;  . Prostate photovaporization  5/16    Dr Budd Palmer    Current Outpatient Rx  Name  Route  Sig  Dispense  Refill  . acetaminophen (TYLENOL) 650 MG CR tablet   Oral   Take 650 mg by mouth every 8 (eight) hours as needed for pain. For hand arthritis         . alfuzosin (UROXATRAL) 10 MG 24 hr tablet   Oral   Take 10  mg by mouth daily with breakfast.         . Ascorbic Acid (VITAMIN C) 100 MG tablet   Oral   Take 100 mg by mouth daily.         Marland Kitchen aspirin 81 MG tablet   Oral   Take 162 mg by mouth daily.          Marland Kitchen atorvastatin (LIPITOR) 40 MG tablet      TAKE 1 TABLET EVERY DAY AS DIRECTED Patient taking differently: TAKE 0.5 TABLET EVERY OTHER DAY.   90 tablet   3   . Biotin 1000 MCG tablet   Oral   Take 1,000 mcg by mouth daily.         . Blood Pressure Monitoring (BLOOD PRESSURE MONITOR  AUTOMAT) DEVI   Does not apply   1 Device by Does not apply route once.   1 Device   0   . carvedilol (COREG) 12.5 MG tablet      TAKE ONE TABLET TWICE A DAY WITH MEALS.   180 tablet   3   . finasteride (PROSCAR) 5 MG tablet   Oral   Take 1 tablet (5 mg total) by mouth daily.   90 tablet   1   . furosemide (LASIX) 20 MG tablet      TAKE 1 TABLET EVERY DAY AS NEEDED   30 tablet   3   . glimepiride (AMARYL) 1 MG tablet      TAKE 1/2 TABLET AM DAILY.         Marland Kitchen Glucosamine-Chondroit-Vit C-Mn (GLUCOSAMINE CHONDR 1500 COMPLX) CAPS   Oral   Take by mouth daily.         Marland Kitchen LANTUS 100 UNIT/ML injection   Subcutaneous   Inject 36 Units into the skin daily.            Dispense as written.   . Multiple Vitamin (MULTIVITAMIN) tablet   Oral   Take 1 tablet by mouth daily.         Marland Kitchen omeprazole (PRILOSEC) 20 MG capsule      TAKE ONE CAPSULE TWICE A DAY   60 capsule   11   . ramipril (ALTACE) 10 MG capsule      TAKE ONE CAPSULE DAILY   30 capsule   6     Allergies Review of patient's allergies indicates no known allergies.  Family History  Problem Relation Age of Onset  . Lung cancer Mother   . Heart disease Father   . Colon cancer Neg Hx     Social History Social History  Substance Use Topics  . Smoking status: Never Smoker   . Smokeless tobacco: Never Used  . Alcohol Use: Yes     Comment: occ cocktail    Review of Systems  Constitutional: Negative for fever, no chills Eyes: Negative for visual changes. ENT: Negative for sore throat Cardiovascular: Negative for chest pain. Respiratory: Negative for shortness of breath. Gastrointestinal: As above Genitourinary: Negative for dysuria. No hematuria Musculoskeletal: Negative for back pain. Skin: Negative for rash. Neurological: Negative for headaches  Psychiatric: No anxiety     ____________________________________________   PHYSICAL EXAM:  VITAL SIGNS: ED Triage Vitals  Enc Vitals  Group     BP 03/09/15 1220 179/71 mmHg     Pulse Rate 03/09/15 1220 60     Resp 03/09/15 1220 18     Temp 03/09/15 1220 98.2 F (36.8 C)     Temp Source 03/09/15 1220  Oral     SpO2 03/09/15 1220 95 %     Weight 03/09/15 1220 217 lb (98.431 kg)     Height 03/09/15 1220 6\' 2"  (1.88 m)     Head Cir --      Peak Flow --      Pain Score 03/09/15 1232 5     Pain Loc --      Pain Edu? --      Excl. in [place]? --      Constitutional: Alert and oriented. Well appearing and in no distress. Pleasant and interactive Eyes: Conjunctivae are normal.  ENT   Head: Normocephalic and atraumatic.   Mouth/Throat: Mucous membranes are moist. Cardiovascular: Normal rate, regular rhythm. Normal and symmetric distal pulses are present in all extremities. No murmurs, rubs, or gallops. Respiratory: Normal respiratory effort without tachypnea nor retractions. Breath sounds are clear and equal bilaterally.  Gastrointestinal: Mild tenderness to palpation left lower quadrant and right lower quadrant. No evidence of peritonitis. No distention. There is no CVA tenderness. Genitourinary: deferred Musculoskeletal: Nontender with normal range of motion in all extremities. No lower extremity tenderness nor edema. Neurologic:  Normal speech and language. No gross focal neurologic deficits are appreciated. Skin:  Skin is warm, dry and intact. No rash noted. Psychiatric: Mood and affect are normal. Patient exhibits appropriate insight and judgment.  ____________________________________________    LABS (pertinent positives/negatives)  Labs Reviewed  COMPREHENSIVE METABOLIC PANEL - Abnormal; Notable for the following:    Glucose, Bld 287 (*)    Calcium 8.8 (*)    Total Protein 6.2 (*)    GFR calc non Af Amer 57 (*)    All other components within normal limits  CBC - Abnormal; Notable for the following:    Platelets 98 (*)    All other components within normal limits  LIPASE, BLOOD  URINALYSIS COMPLETEWITH  MICROSCOPIC ([facility] ONLY)    ____________________________________________   EKG  ED ECG REPORT I, Lavonia Drafts, the attending physician, personally viewed and interpreted this ECG.  Date: 03/09/2015 EKG Time: 12:24 PM Rate: 60 Rhythm: normal sinus rhythm QRS Axis: normal Intervals: normal ST/T Wave abnormalities: normal Conduction Disutrbances: none Narrative Interpretation: unremarkable   ____________________________________________    RADIOLOGY I have personally reviewed any xrays that were ordered on this patient: CT abdomen pelvis is unremarkable  ____________________________________________   PROCEDURES  Procedure(s) performed: none  Critical Care performed: none  ____________________________________________   INITIAL IMPRESSION / ASSESSMENT AND PLAN / ED COURSE  Pertinent labs & imaging results that were available during my care of the patient were reviewed by me and considered in my medical decision making (see chart for details).  Patient presents with mild to moderate lower abdominal discomfort for approximately 4 days is constant. Labs are fairly unremarkable we will obtain CT abdomen and pelvis to evaluate further.  ----------------------------------------- 3:37 PM on 03/09/2015 -----------------------------------------  CT abdomen pelvis unremarkable, urinalysis is normal. Patient reports he is feeling slightly better. I discussed the results with him. He will follow with Dr. Silvio Pate in regards to his BP. He did see Dr. Rockey Situ his cardiologist recently and reports his blood pressure was much better so he suspects it is related to being in the emergency department, he will take an extra dose of his blood pressure medication when he gets home  ____________________________________________   FINAL CLINICAL IMPRESSION(S) / ED DIAGNOSES  Abdominal pain   Lavonia Drafts, MD 03/09/15 1441  Lavonia Drafts, MD 03/09/15 1545

## 2015-03-09 NOTE — Discharge Instructions (Signed)

## 2015-03-10 ENCOUNTER — Telehealth: Payer: Self-pay | Admitting: Cardiovascular Disease

## 2015-03-10 ENCOUNTER — Ambulatory Visit (INDEPENDENT_AMBULATORY_CARE_PROVIDER_SITE_OTHER): Payer: Medicare Other

## 2015-03-10 VITALS — BP 210/91 | HR 57 | Ht 74.0 in | Wt 224.5 lb

## 2015-03-10 DIAGNOSIS — I158 Other secondary hypertension: Secondary | ICD-10-CM | POA: Diagnosis not present

## 2015-03-10 NOTE — Patient Instructions (Addendum)
Pain is sending your BP up  Monitor your BP and if it remains elevated, take an extra ramipril If still elevated, take an extra 1/2 carvedilol  Continue to monitor your BP and call if readings do not come down

## 2015-03-10 NOTE — Telephone Encounter (Addendum)
Spoke w/ pt.  He reports that his BP was elevated while in the ED yesterday for abdominal pain.  Advised him that pain will definitely send his BP up.   He would like to come in for nurse visit to see if his BP cuff is accurate, as he feels that it is reading high.  Transferred him to scheduling to set this up.

## 2015-03-10 NOTE — Telephone Encounter (Signed)
Pt c/o BP issue: STAT if pt c/o blurred vision, one-sided weakness or slurred speech  1. What are your last 5 BP readings? See ed notes 03/09/15   2. Are you having any other symptoms (ex. Dizziness, headache, blurred vision, passed out)? No weak and lethargic took pain medicine rx from ed   3. What is your BP issue?  bp was up at ed visit yesterday wants to be seen today to check bp in office.

## 2015-03-10 NOTE — Progress Notes (Signed)
1.) Reason for visit: BP check  2.) Name of MD requesting visit: Dr. Rockey Situ  3.) H&P: Pt was in ED yesterday for abdominal pain.  BP was elevated and pt was advised to f/u in our office.  4.) ROS related to problem: Pt presented to [facility] ED yesterday for abdominal pain. Tests were inconclusive and pt was given Tramadol.  He asks that I compare his BP machine w/ mine, as he feels that his machine is inaccurate.  Advised pt that pain will affect his BP, but he states that the ED did not tell him that. He is concerned that he may have back pain that is radiating to his stomach and this is why the cause could not be determined. He will call Dr. Alla German office tomorrow to set up an appt.   5.) Assessment and plan per MD: Dr. Rockey Situ looked over pt's chart and advises that pt can take an extra ramipril if BP remains elevated, or extra 1/2 carvedilol.  Offered pt option of another pill, be he declines, would prefer to use the meds he has.  Advised him to continue to monitor and call back if readings do not improve.

## 2015-03-12 DIAGNOSIS — R1011 Right upper quadrant pain: Secondary | ICD-10-CM | POA: Diagnosis not present

## 2015-03-12 DIAGNOSIS — R112 Nausea with vomiting, unspecified: Secondary | ICD-10-CM | POA: Diagnosis not present

## 2015-03-13 DIAGNOSIS — K7689 Other specified diseases of liver: Secondary | ICD-10-CM | POA: Diagnosis not present

## 2015-03-13 DIAGNOSIS — R1011 Right upper quadrant pain: Secondary | ICD-10-CM | POA: Diagnosis not present

## 2015-03-13 DIAGNOSIS — N281 Cyst of kidney, acquired: Secondary | ICD-10-CM | POA: Diagnosis not present

## 2015-03-16 MED ORDER — ONDANSETRON HCL 4 MG/2ML IJ SOLN
INTRAMUSCULAR | Status: AC
  Filled 2015-03-16: qty 2

## 2015-03-16 MED ORDER — HYDROMORPHONE HCL 1 MG/ML IJ SOLN
INTRAMUSCULAR | Status: AC
  Filled 2015-03-16: qty 1

## 2015-03-17 NOTE — Progress Notes (Signed)
Can we call to follow-up on his blood pressures See if his abdominal pain has resolved? thx

## 2015-03-18 ENCOUNTER — Telehealth: Payer: Self-pay

## 2015-03-18 NOTE — Telephone Encounter (Signed)
Spoke w/ pt.  He reports that he is feeling better, his BP has been good.  He has MRI at [facility] on Monday that he has not heard back on.  He is appreciative of the call and will let us know if we can be of further assistance.

## 2015-03-23 ENCOUNTER — Other Ambulatory Visit: Payer: Self-pay | Admitting: Cardiovascular Disease

## 2015-03-27 DIAGNOSIS — R109 Unspecified abdominal pain: Secondary | ICD-10-CM | POA: Diagnosis not present

## 2015-03-27 DIAGNOSIS — R1013 Epigastric pain: Secondary | ICD-10-CM | POA: Diagnosis not present

## 2015-03-27 DIAGNOSIS — R112 Nausea with vomiting, unspecified: Secondary | ICD-10-CM | POA: Diagnosis not present

## 2015-03-27 DIAGNOSIS — R1011 Right upper quadrant pain: Secondary | ICD-10-CM | POA: Diagnosis not present

## 2015-04-02 DIAGNOSIS — R1011 Right upper quadrant pain: Secondary | ICD-10-CM | POA: Diagnosis not present

## 2015-04-02 DIAGNOSIS — R143 Flatulence: Secondary | ICD-10-CM | POA: Diagnosis not present

## 2015-04-17 ENCOUNTER — Other Ambulatory Visit: Payer: Self-pay | Admitting: Cardiovascular Disease

## 2015-05-12 ENCOUNTER — Telehealth: Payer: Self-pay | Admitting: Cardiovascular Disease

## 2015-05-12 DIAGNOSIS — Z794 Long term (current) use of insulin: Secondary | ICD-10-CM | POA: Diagnosis not present

## 2015-05-12 DIAGNOSIS — E118 Type 2 diabetes mellitus with unspecified complications: Secondary | ICD-10-CM | POA: Diagnosis not present

## 2015-05-12 NOTE — Telephone Encounter (Signed)
Patient scheduled 1st available on 06/26/15 at 3 pm.  Will add to waitlist. Patient was offered this appointment prior to sending triage note and stated he may die or go to the ER in the meantime.  Called patient just now and he is thankful for appt.  Aware we will call him when someone cancels.

## 2015-05-12 NOTE — Telephone Encounter (Signed)
He needs an appt to come in and talk to Dr. Rockey Situ. Can we set him up for Dr. Donivan Scull next available and put him on the wait list in the event of a cancellation? I'll put him on my list, as well.  Thank you!

## 2015-05-12 NOTE — Telephone Encounter (Signed)
Does he need a stress test for shortness of breath? This may pick up a coronary blockage

## 2015-05-12 NOTE — Telephone Encounter (Signed)
Will make Dr. Rockey Situ aware.

## 2015-05-12 NOTE — Telephone Encounter (Signed)
Pt c/o Shortness Of Breath: STAT if SOB developed within the last 24 hours or pt is noticeably SOB on the phone  1. Are you currently SOB (can you hear that pt is SOB on the phone)?   no  2. How long have you been experiencing SOB? Since january  3. Are you SOB when sitting or when up moving around? Moving around gets better at rest not when sitting  4. Are you currently experiencing any other symptoms? Had these symptoms before and he needs stents placed   PLEASE CALL TO DISCUSS ? NEED FOR CATH AND FU APPT

## 2015-05-14 ENCOUNTER — Encounter: Payer: Self-pay | Admitting: Cardiovascular Disease

## 2015-05-14 ENCOUNTER — Ambulatory Visit (INDEPENDENT_AMBULATORY_CARE_PROVIDER_SITE_OTHER): Payer: Medicare Other | Admitting: Cardiovascular Disease

## 2015-05-14 VITALS — BP 140/70 | HR 65 | Ht 74.0 in | Wt 225.8 lb

## 2015-05-14 DIAGNOSIS — E785 Hyperlipidemia, unspecified: Secondary | ICD-10-CM | POA: Diagnosis not present

## 2015-05-14 DIAGNOSIS — Z951 Presence of aortocoronary bypass graft: Secondary | ICD-10-CM | POA: Diagnosis not present

## 2015-05-14 DIAGNOSIS — I10 Essential (primary) hypertension: Secondary | ICD-10-CM

## 2015-05-14 DIAGNOSIS — R0602 Shortness of breath: Secondary | ICD-10-CM | POA: Diagnosis not present

## 2015-05-14 DIAGNOSIS — I5032 Chronic diastolic (congestive) heart failure: Secondary | ICD-10-CM | POA: Diagnosis not present

## 2015-05-14 DIAGNOSIS — I2510 Atherosclerotic heart disease of native coronary artery without angina pectoris: Secondary | ICD-10-CM

## 2015-05-14 DIAGNOSIS — E118 Type 2 diabetes mellitus with unspecified complications: Secondary | ICD-10-CM

## 2015-05-14 MED ORDER — POTASSIUM CHLORIDE ER 10 MEQ PO TBCR: 10.0000 meq | EXTENDED_RELEASE_TABLET | Freq: Every day | ORAL | Status: DC

## 2015-05-14 MED ORDER — FUROSEMIDE 20 MG PO TABS: 20.0000 mg | ORAL_TABLET | Freq: Two times a day (BID) | ORAL | Status: DC | PRN

## 2015-05-14 NOTE — Assessment & Plan Note (Signed)
Cholesterol is at goal on the current lipid regimen. No changes to the medications were made.  

## 2015-05-14 NOTE — Assessment & Plan Note (Signed)
Blood pressure is well controlled on today's visit. No changes made to the medications. 

## 2015-05-14 NOTE — Assessment & Plan Note (Signed)
We have encouraged continued exercise, careful diet management in an effort to lose weight. 

## 2015-05-14 NOTE — Progress Notes (Signed)
Patient ID: [name] [name], male    DOB: May 08, 1933, 82 y.o.   MRN: [id number]  HPI Comments: 82 year old gentleman with a history of coronary artery disease, bypass surgery in November 2006, history of diabetes, obesity, hyperlipidemia, hypertension, erectile dysfunction who presents for routine followup of his coronary artery disease.  In follow-up, he reports having worsening shortness of breath over the past 6 months, notable in the past 2 months Wife presents with him today and confirms these shortness of breath symptoms Feels he cannot walk very far secondary to his breathing Short of breath getting out of the shower, does not exercise like he used to in the past Unclear if he is having chest tightness with his shortness of breath Does not take Lasix on a regular basis Wife reports he is more short of breath after  vacation at the beach  He is uncertain if symptoms are secondary to blockage or fluid retention Previous echocardiogram in 2015 which was essentially normal  he denies any leg edema Continues to have difficulty with his weight  CT scan of his abdomen reviewed showing mild diffuse aortic atherosclerosis Review of lab work showing elevated glucose levels but does report hemoglobin A1c 6.9  EKG on today's visit shows normal sinus rhythm with rate 65 bpm, rare APC, no significant ST or T-wave changes  Other past medical history Reports he saw specialist for his back in [place], had MRI showing arthritis no spinal stenosis He is considering starting meloxicam Was previously on Celebrex. This is more expensive  Previously took a trip [place] with long car trip for 10 hours x2 days each way. He had significant lower extremity edema. He took additional doses of Lasix with improvement of his swelling. He reports having chronic swelling of the left lower extremity, swelling of the right lower extremity has resolved. Doppler of the lower extremity on the left showed no  DVT  Periodically has subxiphoid pain that is sharp, unclear if it is associated with food. No longer taking a statin as this caused leg cramps He continues to bike 3-6 miles per day. Unable to walk long distances secondary to chronic knee and hip pain  Hemoglobin A1c 6.7.   participating in a study at [facility] where they manage his insulin . 6 Total cholesterol 112, LDL 61 with no cholesterol medication  catheterization for chest pain 06/05/2013 that showed patent grafts with LIMA to the LAD, vein graft to the diagonal, vein graft to the OM 3, vein graft to the distal RCA. Normal filling pressures and normal LV function.  Echocardiogram April 2009 shows normal systolic function with mild LVH, diastolic relaxation abnormality, mildly enlarged left atrium, mild aortic insufficiency. Cardiac CT scan in March 2009 showed severe coronary artery disease with patent grafts x4, saphenous vein graft to the PDA, OM, diagonal and a LIMA graft to the LAD.  No Known Allergies  Outpatient Encounter Prescriptions as of 05/14/2015  Medication Sig  . acetaminophen (TYLENOL) 650 MG CR tablet Take 650 mg by mouth every 8 (eight) hours as needed for pain. For hand arthritis  . Ascorbic Acid (VITAMIN C) 100 MG tablet Take 100 mg by mouth daily.  Marland Kitchen aspirin 81 MG tablet Take 162 mg by mouth daily.   Marland Kitchen atorvastatin (LIPITOR) 40 MG tablet Take 20 mg by mouth every other day.  . Biotin 1000 MCG tablet Take 1,000 mcg by mouth daily.  . carvedilol (COREG) 12.5 MG tablet TAKE ONE TABLET TWICE A DAY WITH MEALS.  Marland Kitchen  finasteride (PROSCAR) 5 MG tablet Take 1 tablet (5 mg total) by mouth daily.  . furosemide (LASIX) 20 MG tablet Take 1 tablet (20 mg total) by mouth 2 (two) times daily as needed.  Marland Kitchen glimepiride (AMARYL) 1 MG tablet TAKE 1/2 TABLET AM DAILY.  Marland Kitchen Glucosamine-Chondroit-Vit C-Mn (GLUCOSAMINE CHONDR 1500 COMPLX) CAPS Take by mouth daily.  Marland Kitchen LANTUS 100 UNIT/ML injection Inject 17 Units into the skin 2 (two) times  daily.   . Multiple Vitamin (MULTIVITAMIN) tablet Take 1 tablet by mouth daily.  Marland Kitchen omeprazole (PRILOSEC) 20 MG capsule TAKE ONE CAPSULE TWICE A DAY  . ramipril (ALTACE) 10 MG capsule TAKE ONE CAPSULE DAILY  . [DISCONTINUED] furosemide (LASIX) 20 MG tablet TAKE 1 TABLET EVERY DAY AS NEEDED  . [DISCONTINUED] traMADol (ULTRAM) 50 MG tablet Take 1 tablet (50 mg total) by mouth every 6 (six) hours as needed.  . potassium chloride (K-DUR) 10 MEQ tablet Take 1 tablet (10 mEq total) by mouth daily.  . [DISCONTINUED] alfuzosin (UROXATRAL) 10 MG 24 hr tablet Take 10 mg by mouth daily with breakfast.  . [DISCONTINUED] atorvastatin (LIPITOR) 40 MG tablet TAKE 1 TABLET EVERY DAY AS DIRECTED  . [DISCONTINUED] Blood Pressure Monitoring (BLOOD PRESSURE MONITOR AUTOMAT) DEVI 1 Device by Does not apply route once.   No facility-administered encounter medications on file as of 05/14/2015.    Past Medical History  Diagnosis Date  . Coronary artery disease     a. 4v CABG 01/2005; b. cath 06/05/13 showed patent grafts: LIMA-LAD, VG-D, VG-OM3, VG-dRCA, nl filling pressures, nl LV fxn  . Hyperlipidemia   . Hypertension   . IDDM (insulin dependent diabetes mellitus) ([place]) 2000  . Hypertrophy of prostate without urinary obstruction and other lower urinary tract symptoms (LUTS)   . GERD (gastroesophageal reflux disease)   . Perirectal fistula   . Cataract   . ED (erectile dysfunction)   . Osteoarthritis   . Diastolic dysfunction     a. echo 0000000: nl systolic fxn, mild LVH, diastolic relaxation abnormality, mildly enlarged LA, mild Ao insufficiency    Past Surgical History  Procedure Laterality Date  . Coronary artery bypass graft  2007    x 4  . Foot surgery  2012    right foot  . Lumbar laminectomy  1989  . Joint replacement  8/10    Right THR--[name]  . Colonoscopy w/ polypectomy    . Cholecystectomy  05/15/2011    Procedure: LAPAROSCOPIC CHOLECYSTECTOMY;  Surgeon: Rolm Bookbinder, MD;  Location:  [place];  Service: General;  Laterality: N/A;  . Cataract extraction  sept 2013    right  . Cardiac [facility]  [facility]   Marland Kitchen Left and right heart catheterization with coronary/graft angiogram N/A 06/05/2013    Procedure: LEFT AND RIGHT HEART CATHETERIZATION WITH Beatrix Fetters;  Surgeon: Peter M Martinique, MD;  Location: [facility] CATH LAB;  Service: Cardiovascular;  Laterality: N/A;  . Prostate photovaporization  5/16    Dr Budd Palmer    Social History  reports that he has never smoked. He has never used smokeless tobacco. He reports that he drinks alcohol. He reports that he does not use illicit drugs.  Family History family history includes Heart disease in his father; Lung cancer in his mother. There is no history of Colon cancer.   Review of Systems  Constitutional: Negative.   Respiratory: Positive for shortness of breath.   Gastrointestinal: Negative.   Musculoskeletal: Positive for arthralgias.  Skin: Negative.  Neurological: Negative.   Hematological: Negative.   Psychiatric/Behavioral: Negative.   All other systems reviewed and are negative.   BP 140/70 mmHg  Pulse 65  Ht 6\' 2"  (1.88 m)  Wt 225 lb 12.8 oz (102.422 kg)  BMI 28.98 kg/m2  SpO2 94%  Physical Exam  Constitutional: He is oriented to person, place, and time. He appears well-developed and well-nourished.  Obese  HENT:  Head: Normocephalic.  Nose: Nose normal.  Mouth/Throat: Oropharynx is clear and moist.  Eyes: Conjunctivae are normal. Pupils are equal, round, and reactive to light.  Neck: Normal range of motion. Neck supple. No JVD present.  Cardiovascular: Normal rate, regular rhythm, S1 normal, S2 normal, normal heart sounds and intact distal pulses.  Exam reveals no gallop and no friction rub.   No murmur heard. Trace edema extending half for a up the shin on the left, no significant edema on the right  Pulmonary/Chest: Effort normal and breath sounds normal. No respiratory  distress. He has no wheezes. He has no rales. He exhibits no tenderness.  Abdominal: Soft. Bowel sounds are normal. He exhibits no distension. There is no tenderness.  Musculoskeletal: Normal range of motion. He exhibits no edema or tenderness.  Lymphadenopathy:    He has no cervical adenopathy.  Neurological: He is alert and oriented to person, place, and time. Coordination normal.  Skin: Skin is warm and dry. No rash noted. No erythema.  Psychiatric: He has a normal mood and affect. His behavior is normal. Judgment and thought content normal.      Assessment and Plan   Nursing note and vitals reviewed.

## 2015-05-14 NOTE — Patient Instructions (Addendum)
Please take lasix 2 pills in the morning or one pill twice a day Take with potassium Weight daily  If you do not notice an improvement in the breathing, Call the office for a stress test or cardiac cath  Please call us if you have new issues that need to be addressed before your next appt.  Your physician wants you to follow-up in: 1 month.

## 2015-05-14 NOTE — Assessment & Plan Note (Signed)
Etiology of his shortness of breath is unclear Weight continues to be a problem, unable to exclude diastolic CHF Also unable to exclude ischemia We have offered echocardiogram to evaluate right heart pressures, also offered stress test and cardiac catheterization to rule out ischemia. After long discussion, he will take Lasix 20 mg twice a day for the next several days and if no improvement of his symptoms, we'll call our office to schedule either stress test or catheterization. If he does have improvement of his symptoms, this would indicate diastolic CHF

## 2015-05-14 NOTE — Assessment & Plan Note (Signed)
As detailed above, will have short threshold to ordering stress test or catheterization depending on his symptoms in the next week

## 2015-05-14 NOTE — Assessment & Plan Note (Signed)
Surgery 11 years ago, now with worsening shortness of breath. He feels symptoms are similar to the past when he had blockages. She will call us next week to schedule either stress test or catheterization if symptoms do not improve

## 2015-05-18 ENCOUNTER — Telehealth: Payer: Self-pay | Admitting: Cardiovascular Disease

## 2015-05-18 DIAGNOSIS — R079 Chest pain, unspecified: Secondary | ICD-10-CM

## 2015-05-18 DIAGNOSIS — R0602 Shortness of breath: Secondary | ICD-10-CM

## 2015-05-18 NOTE — Telephone Encounter (Signed)
Pt calling back to see if we can call him and schedule a stress test.  Please call

## 2015-05-19 NOTE — Telephone Encounter (Signed)
Pt sched for [employer] tomorrow @ 8:00. Pt verbalizes understanding to arrive @ 7:30, NPO after midnight, hold carvedilol tonight and in the am, hold diabetes meds in the am. Asked him to call back w/ any questions or concerns.

## 2015-05-20 ENCOUNTER — Other Ambulatory Visit: Payer: Self-pay

## 2015-05-20 ENCOUNTER — Other Ambulatory Visit: Payer: Self-pay | Admitting: Student

## 2015-05-20 ENCOUNTER — Inpatient Hospital Stay: Admit: 2015-05-20 | Payer: Self-pay

## 2015-05-20 ENCOUNTER — Other Ambulatory Visit
Admission: RE | Admit: 2015-05-20 | Discharge: 2015-05-20 | Disposition: A | Discharge status: Home / Self Care | Payer: Medicare Other | Source: Ambulatory Visit | Attending: Cardiovascular Disease | Admitting: Cardiovascular Disease

## 2015-05-20 ENCOUNTER — Encounter
Admission: RE | Admit: 2015-05-20 | Discharge: 2015-05-20 | Disposition: A | Discharge status: Home / Self Care | Payer: Medicare Other | Source: Ambulatory Visit | Attending: Cardiovascular Disease | Admitting: Cardiovascular Disease

## 2015-05-20 DIAGNOSIS — R0602 Shortness of breath: Secondary | ICD-10-CM | POA: Diagnosis not present

## 2015-05-20 DIAGNOSIS — R079 Chest pain, unspecified: Secondary | ICD-10-CM

## 2015-05-20 LAB — NM MYOCAR MULTI W/SPECT W/WALL MOTION / EF
CHL CUP NUCLEAR SSS: 1
CSEPED: 0 min
CSEPPHR: 82 {beats}/min
Estimated workload: 1 METS
Exercise duration (sec): 0 s
LV dias vol: 130 mL (ref 62–150)
LVSYSVOL: 45 mL
MPHR: 141 {beats}/min
NUC STRESS TID: 1
Percent HR: 58 %
Rest HR: 58 {beats}/min
SDS: 0
SRS: 1

## 2015-05-20 LAB — TROPONIN I: Troponin I: <0.03 ng/mL (ref <0.031)

## 2015-05-20 MED ORDER — TECHNETIUM TC 99M SESTAMIBI - CARDIOLITE
12.6490 | Freq: Once | INTRAVENOUS | Status: AC | PRN
  Administered 2015-05-20: 09:00:00 12.649 via INTRAVENOUS

## 2015-05-20 MED ORDER — TECHNETIUM TC 99M SESTAMIBI - CARDIOLITE
31.9800 | Freq: Once | INTRAVENOUS | Status: AC | PRN
  Administered 2015-05-20: 31.98 via INTRAVENOUS

## 2015-05-20 MED ORDER — REGADENOSON 0.4 MG/5ML IV SOLN
0.4000 mg | Freq: Once | INTRAVENOUS | Status: AC
  Administered 2015-05-20: 0.4 mg via INTRAVENOUS

## 2015-05-22 DIAGNOSIS — R0602 Shortness of breath: Secondary | ICD-10-CM | POA: Diagnosis not present

## 2015-05-22 DIAGNOSIS — D696 Thrombocytopenia, unspecified: Secondary | ICD-10-CM | POA: Diagnosis not present

## 2015-05-22 DIAGNOSIS — I2510 Atherosclerotic heart disease of native coronary artery without angina pectoris: Secondary | ICD-10-CM | POA: Diagnosis not present

## 2015-05-22 DIAGNOSIS — R1900 Intra-abdominal and pelvic swelling, mass and lump, unspecified site: Secondary | ICD-10-CM | POA: Diagnosis not present

## 2015-05-22 DIAGNOSIS — R1012 Left upper quadrant pain: Secondary | ICD-10-CM | POA: Diagnosis not present

## 2015-05-26 ENCOUNTER — Telehealth: Payer: Self-pay | Admitting: Cardiovascular Disease

## 2015-05-26 NOTE — Telephone Encounter (Signed)
Patient wants nm results from 05-20-15. Please call.

## 2015-05-26 NOTE — Telephone Encounter (Signed)
Looks like this was low risk. Only intervention if symptoms warrant. Would send to ordering provider to assess his symptoms at that time.

## 2015-05-27 NOTE — Telephone Encounter (Signed)
Reviewed results w/ pt.  Advised of Dr. Donivan Scull recommendation. He will keep his appt on 06/08/15 to discuss further before making any decisions.

## 2015-05-27 NOTE — Telephone Encounter (Signed)
I have reviewed the images, this is a low risk scan, Looks good, Could do an echo for shortness of breath, could be fluid retention

## 2015-06-01 DIAGNOSIS — R1012 Left upper quadrant pain: Secondary | ICD-10-CM | POA: Diagnosis not present

## 2015-06-01 DIAGNOSIS — Z794 Long term (current) use of insulin: Secondary | ICD-10-CM | POA: Diagnosis not present

## 2015-06-01 DIAGNOSIS — R109 Unspecified abdominal pain: Secondary | ICD-10-CM | POA: Diagnosis not present

## 2015-06-01 DIAGNOSIS — E119 Type 2 diabetes mellitus without complications: Secondary | ICD-10-CM | POA: Diagnosis not present

## 2015-06-01 DIAGNOSIS — R9431 Abnormal electrocardiogram [ECG] [EKG]: Secondary | ICD-10-CM | POA: Diagnosis not present

## 2015-06-01 DIAGNOSIS — R918 Other nonspecific abnormal finding of lung field: Secondary | ICD-10-CM | POA: Diagnosis not present

## 2015-06-01 DIAGNOSIS — Z7982 Long term (current) use of aspirin: Secondary | ICD-10-CM | POA: Diagnosis not present

## 2015-06-01 DIAGNOSIS — Z951 Presence of aortocoronary bypass graft: Secondary | ICD-10-CM | POA: Diagnosis not present

## 2015-06-01 DIAGNOSIS — I4510 Unspecified right bundle-branch block: Secondary | ICD-10-CM | POA: Diagnosis not present

## 2015-06-01 DIAGNOSIS — M5412 Radiculopathy, cervical region: Secondary | ICD-10-CM | POA: Diagnosis not present

## 2015-06-01 DIAGNOSIS — I2510 Atherosclerotic heart disease of native coronary artery without angina pectoris: Secondary | ICD-10-CM | POA: Diagnosis not present

## 2015-06-01 DIAGNOSIS — I491 Atrial premature depolarization: Secondary | ICD-10-CM | POA: Diagnosis not present

## 2015-06-01 DIAGNOSIS — R0781 Pleurodynia: Secondary | ICD-10-CM | POA: Diagnosis not present

## 2015-06-01 DIAGNOSIS — Z79899 Other long term (current) drug therapy: Secondary | ICD-10-CM | POA: Diagnosis not present

## 2015-06-01 DIAGNOSIS — I10 Essential (primary) hypertension: Secondary | ICD-10-CM | POA: Diagnosis not present

## 2015-06-01 DIAGNOSIS — R079 Chest pain, unspecified: Secondary | ICD-10-CM | POA: Diagnosis not present

## 2015-06-01 DIAGNOSIS — R0789 Other chest pain: Secondary | ICD-10-CM | POA: Diagnosis not present

## 2015-06-08 ENCOUNTER — Encounter (INDEPENDENT_AMBULATORY_CARE_PROVIDER_SITE_OTHER): Payer: Self-pay

## 2015-06-08 ENCOUNTER — Encounter: Payer: Self-pay | Admitting: Cardiovascular Disease

## 2015-06-08 ENCOUNTER — Ambulatory Visit (INDEPENDENT_AMBULATORY_CARE_PROVIDER_SITE_OTHER): Payer: Medicare Other | Admitting: Cardiovascular Disease

## 2015-06-08 VITALS — BP 140/72 | HR 69 | Ht 74.0 in | Wt 223.2 lb

## 2015-06-08 DIAGNOSIS — I10 Essential (primary) hypertension: Secondary | ICD-10-CM | POA: Diagnosis not present

## 2015-06-08 DIAGNOSIS — R0602 Shortness of breath: Secondary | ICD-10-CM

## 2015-06-08 DIAGNOSIS — E118 Type 2 diabetes mellitus with unspecified complications: Secondary | ICD-10-CM | POA: Diagnosis not present

## 2015-06-08 DIAGNOSIS — I5032 Chronic diastolic (congestive) heart failure: Secondary | ICD-10-CM

## 2015-06-08 DIAGNOSIS — E785 Hyperlipidemia, unspecified: Secondary | ICD-10-CM

## 2015-06-08 DIAGNOSIS — I2510 Atherosclerotic heart disease of native coronary artery without angina pectoris: Secondary | ICD-10-CM | POA: Diagnosis not present

## 2015-06-08 DIAGNOSIS — Z951 Presence of aortocoronary bypass graft: Secondary | ICD-10-CM

## 2015-06-08 DIAGNOSIS — M792 Neuralgia and neuritis, unspecified: Secondary | ICD-10-CM | POA: Insufficient documentation

## 2015-06-08 NOTE — Assessment & Plan Note (Addendum)
Radiating nerve pain around his left flank, concerning for nerve entrapment or shingles He has follow-up at [place] discussion concerning various etiologies of his discomfort   Total encounter time more than 25 minutes  Greater than 50% was spent in counseling and coordination of care with the patient

## 2015-06-08 NOTE — Patient Instructions (Signed)
You are doing well. No medication changes were made.  Please call us if you have new issues that need to be addressed before your next appt.  Your physician wants you to follow-up in: 6 months.  You will receive a reminder letter in the mail two months in advance. If you don't receive a letter, please call our office to schedule the follow-up appointment.   

## 2015-06-08 NOTE — Assessment & Plan Note (Signed)
We have encouraged continued exercise, careful diet management in an effort to lose weight. Stressed the importance of hemoglobin A1c in the low 6 range

## 2015-06-08 NOTE — Progress Notes (Signed)
Patient ID: [name] [name], male    DOB: 01-Nov-1933, 82 y.o.   MRN: [id number]  HPI Comments: 82 year old gentleman with a history of coronary artery disease, bypass surgery in November 2006, history of diabetes, obesity, hyperlipidemia, hypertension, erectile dysfunction who presents for routine followup of his coronary artery disease.  On his last clinic visit, he reported having shortness of breath on exertion Nuclear Stress test was ordered to rule out ischemia, images were reviewed by myself, this did not show any large regions of ischemia He was recommended that he try Lasix for possible diastolic heart failure. Wife reports that on 1 or 2 Lasix per day, he has lost several pounds, seems that his shortness of breath may have improved, he is walking faster  In follow-up today, he reports that his back is his biggest complaint. He has pain from the mid back radiating around to his left flank, 2 underneath his ribs in the front.  Tenderness on palpation, underneath the skin, feels like pins sticking him. He currently has a Lidoderm patch in place. Scheduled to see [facility] neurology for possible nerve block He did have workup for this pain including CT scan which was reportedly benign (did show gallstones) Difficulty losing weight  Other past medical history reviewed On his last clinic visit, he reported worsening shortness of breath over the past 6 months  Previous echocardiogram in 2015 which was essentially normal  he denies any leg edema  CT scan of his abdomen reviewed showing mild diffuse aortic atherosclerosis Review of lab work showing elevated glucose levels but does report hemoglobin A1c 6.9  Reports he saw specialist for his back in [place], had MRI showing arthritis no spinal stenosis He is considering starting meloxicam Was previously on Celebrex. This is more expensive  Previously took a trip [place] with long car trip for 10 hours x2 days each way. He had significant  lower extremity edema. He took additional doses of Lasix with improvement of his swelling. He reports having chronic swelling of the left lower extremity, swelling of the right lower extremity has resolved. Doppler of the lower extremity on the left showed no DVT  Periodically has subxiphoid pain that is sharp, unclear if it is associated with food. No longer taking a statin as this caused leg cramps He continues to bike 3-6 miles per day. Unable to walk long distances secondary to chronic knee and hip pain  Hemoglobin A1c 6.7.   participating in a study at [facility] where they manage his insulin . 6 Total cholesterol 112, LDL 61 with no cholesterol medication  catheterization for chest pain 06/05/2013 that showed patent grafts with LIMA to the LAD, vein graft to the diagonal, vein graft to the OM 3, vein graft to the distal RCA. Normal filling pressures and normal LV function.  Echocardiogram April 2009 shows normal systolic function with mild LVH, diastolic relaxation abnormality, mildly enlarged left atrium, mild aortic insufficiency. Cardiac CT scan in March 2009 showed severe coronary artery disease with patent grafts x4, saphenous vein graft to the PDA, OM, diagonal and a LIMA graft to the LAD.  No Known Allergies  Outpatient Encounter Prescriptions as of 06/08/2015  Medication Sig  . acetaminophen (TYLENOL) 650 MG CR tablet Take 650 mg by mouth every 8 (eight) hours as needed for pain. For hand arthritis  . Ascorbic Acid (VITAMIN C) 100 MG tablet Take 100 mg by mouth daily.  Marland Kitchen aspirin 81 MG tablet Take 162 mg by mouth daily.   Marland Kitchen  atorvastatin (LIPITOR) 40 MG tablet Take 20 mg by mouth every other day.  . Biotin 1000 MCG tablet Take 1,000 mcg by mouth daily.  . carvedilol (COREG) 12.5 MG tablet TAKE ONE TABLET TWICE A DAY WITH MEALS.  . finasteride (PROSCAR) 5 MG tablet Take 1 tablet (5 mg total) by mouth daily.  . furosemide (LASIX) 20 MG tablet Take 1 tablet (20 mg total) by mouth 2 (two)  times daily as needed.  Marland Kitchen glimepiride (AMARYL) 1 MG tablet TAKE 1/2 TABLET AM DAILY.  Marland Kitchen Glucosamine-Chondroit-Vit C-Mn (GLUCOSAMINE CHONDR 1500 COMPLX) CAPS Take by mouth daily.  Marland Kitchen LANTUS 100 UNIT/ML injection Inject 17 Units into the skin 2 (two) times daily.   . Multiple Vitamin (MULTIVITAMIN) tablet Take 1 tablet by mouth daily.  Marland Kitchen omeprazole (PRILOSEC) 20 MG capsule TAKE ONE CAPSULE TWICE A DAY  . potassium chloride (K-DUR) 10 MEQ tablet Take 1 tablet (10 mEq total) by mouth daily.  . ramipril (ALTACE) 10 MG capsule TAKE ONE CAPSULE DAILY  . traMADol (ULTRAM) 50 MG tablet Take 50 mg by mouth as needed.    No facility-administered encounter medications on file as of 06/08/2015.    Past Medical History  Diagnosis Date  . Coronary artery disease     a. 4v CABG 01/2005; b. cath 06/05/13 showed patent grafts: LIMA-LAD, VG-D, VG-OM3, VG-dRCA, nl filling pressures, nl LV fxn  . Hyperlipidemia   . Hypertension   . IDDM (insulin dependent diabetes mellitus) ([place]) 2000  . Hypertrophy of prostate without urinary obstruction and other lower urinary tract symptoms (LUTS)   . GERD (gastroesophageal reflux disease)   . Perirectal fistula   . Cataract   . ED (erectile dysfunction)   . Osteoarthritis   . Diastolic dysfunction     a. echo 0000000: nl systolic fxn, mild LVH, diastolic relaxation abnormality, mildly enlarged LA, mild Ao insufficiency    Past Surgical History  Procedure Laterality Date  . Coronary artery bypass graft  2007    x 4  . Foot surgery  2012    right foot  . Lumbar laminectomy  1989  . Joint replacement  8/10    Right THR--[name]  . Colonoscopy w/ polypectomy    . Cholecystectomy  05/15/2011    Procedure: LAPAROSCOPIC CHOLECYSTECTOMY;  Surgeon: Rolm Bookbinder, MD;  Location: [place];  Service: General;  Laterality: N/A;  . Cataract extraction  sept 2013    right  . Cardiac catheterization  06/05/[facility]    [facility]   Marland Kitchen Left and right heart catheterization with  coronary/graft angiogram N/A 06/05/2013    Procedure: LEFT AND RIGHT HEART CATHETERIZATION WITH Beatrix Fetters;  Surgeon: Peter M Martinique, MD;  Location: [facility];  Service: Cardiovascular;  Laterality: N/A;  . Prostate photovaporization  5/16    Dr Budd Palmer    Social History  reports that he has never smoked. He has never used smokeless tobacco. He reports that he drinks alcohol. He reports that he does not use illicit drugs.  Family History family history includes Heart disease in his father; Lung cancer in his mother. There is no history of Colon cancer.   Review of Systems  Constitutional: Negative.   Respiratory: Positive for shortness of breath.   Gastrointestinal: Negative.   Musculoskeletal: Positive for back pain and arthralgias.  Skin: Negative.   Neurological: Negative.   Hematological: Negative.   Psychiatric/Behavioral: Negative.   All other systems reviewed and are negative.   BP 140/72 mmHg  Pulse 69  Ht  6\' 2"  (1.88 m)  Wt 223 lb 4 oz (101.266 kg)  BMI 28.65 kg/m2  Physical Exam  Constitutional: He is oriented to person, place, and time. He appears well-developed and well-nourished.  Obese  HENT:  Head: Normocephalic.  Nose: Nose normal.  Mouth/Throat: Oropharynx is clear and moist.  Eyes: Conjunctivae are normal. Pupils are equal, round, and reactive to light.  Neck: Normal range of motion. Neck supple. No JVD present.  Cardiovascular: Normal rate, regular rhythm, S1 normal, S2 normal, normal heart sounds and intact distal pulses.  Exam reveals no gallop and no friction rub.   No murmur heard. Pulmonary/Chest: Effort normal and breath sounds normal. No respiratory distress. He has no wheezes. He has no rales. He exhibits no tenderness.  Abdominal: Soft. Bowel sounds are normal. He exhibits no distension. There is no tenderness.  Musculoskeletal: Normal range of motion. He exhibits no edema or tenderness.  Lymphadenopathy:    He has no  cervical adenopathy.  Neurological: He is alert and oriented to person, place, and time. Coordination normal.  Skin: Skin is warm and dry. No rash noted. No erythema.  Psychiatric: He has a normal mood and affect. His behavior is normal. Judgment and thought content normal.      Assessment and Plan   Nursing note and vitals reviewed.

## 2015-06-08 NOTE — Assessment & Plan Note (Signed)
Blood pressure is well controlled on today's visit. No changes made to the medications. 

## 2015-06-08 NOTE — Assessment & Plan Note (Addendum)
Long discussion with him concerning conditioning, need for weight loss, various types of exercise he can do Will recommend pulmonary rehab. Likely having stable anginal symptoms.

## 2015-06-08 NOTE — Assessment & Plan Note (Addendum)
Stress test results discussed with him in detail Suggested he call our office if he has worsening symptoms of chest tightness, cardiac catheterization will be performed For shortness of breath sx, may need to be enrolled in pulmonary rehab

## 2015-06-08 NOTE — Assessment & Plan Note (Signed)
Cholesterol is at goal on the current lipid regimen. No changes to the medications were made.  

## 2015-06-08 NOTE — Assessment & Plan Note (Addendum)
No angina symptoms. Shortness of breath likely secondary to stable angina, deconditioning, obesity, diastolic CHF.  Will refer for pulmonary rehab.  If symptoms get worse, would need invasive testing

## 2015-06-08 NOTE — Assessment & Plan Note (Signed)
shortness of breath mildly improved with Lasix daily him a sometimes twice a day Recommended weight loss

## 2015-06-15 DIAGNOSIS — M5414 Radiculopathy, thoracic region: Secondary | ICD-10-CM | POA: Diagnosis not present

## 2015-06-26 ENCOUNTER — Ambulatory Visit: Payer: Medicare Other | Admitting: Cardiovascular Disease

## 2015-07-08 ENCOUNTER — Ambulatory Visit: Payer: Medicare Other | Admitting: Gastroenterology

## 2015-07-10 DIAGNOSIS — M4804 Spinal stenosis, thoracic region: Secondary | ICD-10-CM | POA: Diagnosis not present

## 2015-07-10 DIAGNOSIS — M5114 Intervertebral disc disorders with radiculopathy, thoracic region: Secondary | ICD-10-CM | POA: Diagnosis not present

## 2015-07-10 DIAGNOSIS — M4724 Other spondylosis with radiculopathy, thoracic region: Secondary | ICD-10-CM | POA: Diagnosis not present

## 2015-07-13 DIAGNOSIS — G8929 Other chronic pain: Secondary | ICD-10-CM | POA: Diagnosis not present

## 2015-07-13 DIAGNOSIS — R0781 Pleurodynia: Secondary | ICD-10-CM | POA: Diagnosis not present

## 2015-07-13 DIAGNOSIS — M546 Pain in thoracic spine: Secondary | ICD-10-CM | POA: Diagnosis not present

## 2015-07-21 DIAGNOSIS — M546 Pain in thoracic spine: Secondary | ICD-10-CM | POA: Diagnosis not present

## 2015-07-21 DIAGNOSIS — G8929 Other chronic pain: Secondary | ICD-10-CM | POA: Diagnosis not present

## 2015-07-21 DIAGNOSIS — R0781 Pleurodynia: Secondary | ICD-10-CM | POA: Diagnosis not present

## 2015-07-30 ENCOUNTER — Other Ambulatory Visit: Payer: Self-pay | Admitting: Internal Medicine

## 2015-07-31 NOTE — Telephone Encounter (Signed)
Rx sent electronically.  

## 2015-08-12 DIAGNOSIS — E1140 Type 2 diabetes mellitus with diabetic neuropathy, unspecified: Secondary | ICD-10-CM | POA: Diagnosis not present

## 2015-08-12 DIAGNOSIS — N189 Chronic kidney disease, unspecified: Secondary | ICD-10-CM | POA: Diagnosis not present

## 2015-08-12 DIAGNOSIS — I129 Hypertensive chronic kidney disease with stage 1 through stage 4 chronic kidney disease, or unspecified chronic kidney disease: Secondary | ICD-10-CM | POA: Diagnosis not present

## 2015-08-12 DIAGNOSIS — Z794 Long term (current) use of insulin: Secondary | ICD-10-CM | POA: Diagnosis not present

## 2015-08-12 DIAGNOSIS — Z79899 Other long term (current) drug therapy: Secondary | ICD-10-CM | POA: Diagnosis not present

## 2015-08-12 DIAGNOSIS — E118 Type 2 diabetes mellitus with unspecified complications: Secondary | ICD-10-CM | POA: Diagnosis not present

## 2015-08-12 DIAGNOSIS — Z8249 Family history of ischemic heart disease and other diseases of the circulatory system: Secondary | ICD-10-CM | POA: Diagnosis not present

## 2015-08-12 DIAGNOSIS — Z7982 Long term (current) use of aspirin: Secondary | ICD-10-CM | POA: Diagnosis not present

## 2015-08-12 DIAGNOSIS — E1121 Type 2 diabetes mellitus with diabetic nephropathy: Secondary | ICD-10-CM | POA: Diagnosis not present

## 2015-08-12 DIAGNOSIS — I2510 Atherosclerotic heart disease of native coronary artery without angina pectoris: Secondary | ICD-10-CM | POA: Diagnosis not present

## 2015-08-12 DIAGNOSIS — E785 Hyperlipidemia, unspecified: Secondary | ICD-10-CM | POA: Diagnosis not present

## 2015-08-12 DIAGNOSIS — E1151 Type 2 diabetes mellitus with diabetic peripheral angiopathy without gangrene: Secondary | ICD-10-CM | POA: Diagnosis not present

## 2015-08-12 DIAGNOSIS — E1122 Type 2 diabetes mellitus with diabetic chronic kidney disease: Secondary | ICD-10-CM | POA: Diagnosis not present

## 2015-09-10 DIAGNOSIS — L570 Actinic keratosis: Secondary | ICD-10-CM | POA: Diagnosis not present

## 2015-09-10 DIAGNOSIS — L821 Other seborrheic keratosis: Secondary | ICD-10-CM | POA: Diagnosis not present

## 2015-09-10 DIAGNOSIS — D692 Other nonthrombocytopenic purpura: Secondary | ICD-10-CM | POA: Diagnosis not present

## 2015-09-10 DIAGNOSIS — Z85828 Personal history of other malignant neoplasm of skin: Secondary | ICD-10-CM | POA: Diagnosis not present

## 2015-09-14 ENCOUNTER — Telehealth: Payer: Self-pay | Admitting: Cardiovascular Disease

## 2015-09-14 DIAGNOSIS — R208 Other disturbances of skin sensation: Secondary | ICD-10-CM | POA: Diagnosis not present

## 2015-09-14 DIAGNOSIS — R079 Chest pain, unspecified: Secondary | ICD-10-CM

## 2015-09-14 DIAGNOSIS — G8929 Other chronic pain: Secondary | ICD-10-CM | POA: Diagnosis not present

## 2015-09-14 DIAGNOSIS — R0602 Shortness of breath: Secondary | ICD-10-CM

## 2015-09-14 DIAGNOSIS — M546 Pain in thoracic spine: Secondary | ICD-10-CM | POA: Diagnosis not present

## 2015-09-14 DIAGNOSIS — Z01818 Encounter for other preprocedural examination: Secondary | ICD-10-CM

## 2015-09-14 DIAGNOSIS — R0781 Pleurodynia: Secondary | ICD-10-CM | POA: Diagnosis not present

## 2015-09-14 NOTE — Telephone Encounter (Signed)
Pt calling stating he is still having some SOB  Would like to set up a cardiac cath   Pt c/o Shortness Of Breath: STAT if SOB developed within the last 24 hours or pt is noticeably SOB on the phone  1. Are you currently SOB (can you hear that pt is SOB on the phone)? No   2. How long have you been experiencing SOB? All this year, just getting worst  3. Are you SOB when sitting or when up moving around?  Moving around  4. Are you currently experiencing any other symptoms? No

## 2015-09-14 NOTE — Telephone Encounter (Signed)
S/w pt who reports increased SOB w/activity over the past year. At April 2017 OV, states Dr. Rockey Situ recommended cath if sx worsen. Pt is ready to proceed if Dr. Rockey Situ agrees.  He knows he needs to lose "at least 20 lbs but just can't get it off" as his exercise is limited d/t SOB and pain.  He weighs himself daily w/weights fluctuating 216-218 lbs and takes lasix one table daily. Informed pt Dr. Rockey Situ is not in the office this week and he is agreeable to wait for a response next week.  Will forward to MD to review and advise.

## 2015-09-20 NOTE — Telephone Encounter (Signed)
Ok to schedule cardiac cath Would consider July 20th, Thursday  3rd or 4th case

## 2015-09-21 ENCOUNTER — Other Ambulatory Visit
Admission: RE | Admit: 2015-09-21 | Discharge: 2015-09-21 | Disposition: A | Discharge status: Home / Self Care | Payer: Medicare Other | Source: Ambulatory Visit | Attending: Cardiovascular Disease | Admitting: Cardiovascular Disease

## 2015-09-21 ENCOUNTER — Ambulatory Visit
Admission: RE | Admit: 2015-09-21 | Discharge: 2015-09-21 | Disposition: A | Discharge status: Home / Self Care | Payer: Medicare Other | Source: Ambulatory Visit | Attending: Cardiovascular Disease | Admitting: Cardiovascular Disease

## 2015-09-21 DIAGNOSIS — R0602 Shortness of breath: Secondary | ICD-10-CM

## 2015-09-21 DIAGNOSIS — I700 Atherosclerosis of aorta: Secondary | ICD-10-CM | POA: Insufficient documentation

## 2015-09-21 DIAGNOSIS — Z01818 Encounter for other preprocedural examination: Secondary | ICD-10-CM

## 2015-09-21 DIAGNOSIS — R079 Chest pain, unspecified: Secondary | ICD-10-CM

## 2015-09-21 DIAGNOSIS — Z951 Presence of aortocoronary bypass graft: Secondary | ICD-10-CM | POA: Diagnosis not present

## 2015-09-21 LAB — BASIC METABOLIC PANEL
Anion gap: 6 (ref 5–15)
BUN: 30 mg/dL — AB (ref 6–20)
CO2: 24 mmol/L (ref 22–32)
CREATININE: 1.31 mg/dL — AB (ref 0.61–1.24)
Calcium: 8.9 mg/dL (ref 8.9–10.3)
Chloride: 107 mmol/L (ref 101–111)
GFR calc Af Amer: 58 mL/min — ABNORMAL LOW (ref >60)
GFR calc non Af Amer: 50 mL/min — ABNORMAL LOW (ref >60)
GLUCOSE: 177 mg/dL — AB (ref 65–99)
POTASSIUM: 4.7 mmol/L (ref 3.5–5.1)
SODIUM: 137 mmol/L (ref 135–145)

## 2015-09-21 LAB — CBC WITH DIFFERENTIAL/PLATELET
Basophils Absolute: 0.1 10*3/uL (ref 0–0.1)
Basophils Relative: 1 %
EOS ABS: 0.4 10*3/uL (ref 0–0.7)
HCT: 42.2 % (ref 40.0–52.0)
Hemoglobin: 15.3 g/dL (ref 13.0–18.0)
LYMPHS ABS: 1.9 10*3/uL (ref 1.0–3.6)
MCH: 33.3 pg (ref 26.0–34.0)
MCHC: 36.3 g/dL — ABNORMAL HIGH (ref 32.0–36.0)
MCV: 91.8 fL (ref 80.0–100.0)
MONO ABS: 0.7 10*3/uL (ref 0.2–1.0)
Monocytes Relative: 10 %
Neutro Abs: 4.4 10*3/uL (ref 1.4–6.5)
Neutrophils Relative %: 58 %
PLATELETS: 114 10*3/uL — AB (ref 150–440)
RBC: 4.6 MIL/uL (ref 4.40–5.90)
RDW: 12.3 % (ref 11.5–14.5)
WBC: 7.5 10*3/uL (ref 3.8–10.6)

## 2015-09-21 LAB — PROTIME-INR
INR: 1.1
PROTHROMBIN TIME: 14.4 s (ref 11.4–15.0)

## 2015-09-21 NOTE — Telephone Encounter (Signed)
Spoke w/ pt.  He is agreeable to cath this Thursday, July 20 @ 9:30, he understands to arrive @ 8:30. Advised pt that he will need pre-procedure labs & chest x-ray. I will leave orders, as well as instruction for cath at the front desk for pt to pick up at his convenience.  He will come by after lunch to have these tests done.  Asked him to call back w/ any questions or concerns.

## 2015-09-23 DIAGNOSIS — H9202 Otalgia, left ear: Secondary | ICD-10-CM | POA: Diagnosis not present

## 2015-09-24 ENCOUNTER — Ambulatory Visit
Admission: RE | Admit: 2015-09-24 | Discharge: 2015-09-24 | Disposition: A | Discharge status: Home / Self Care | Payer: Medicare Other | Source: Ambulatory Visit | Attending: Cardiovascular Disease | Admitting: Cardiovascular Disease

## 2015-09-24 ENCOUNTER — Encounter
Admission: RE | Disposition: A | Discharge status: Home / Self Care | Payer: Self-pay | Source: Ambulatory Visit | Attending: Cardiovascular Disease

## 2015-09-24 DIAGNOSIS — E118 Type 2 diabetes mellitus with unspecified complications: Secondary | ICD-10-CM | POA: Diagnosis not present

## 2015-09-24 DIAGNOSIS — Z7982 Long term (current) use of aspirin: Secondary | ICD-10-CM | POA: Diagnosis not present

## 2015-09-24 DIAGNOSIS — E785 Hyperlipidemia, unspecified: Secondary | ICD-10-CM | POA: Diagnosis not present

## 2015-09-24 DIAGNOSIS — I5032 Chronic diastolic (congestive) heart failure: Secondary | ICD-10-CM | POA: Diagnosis not present

## 2015-09-24 DIAGNOSIS — R0602 Shortness of breath: Secondary | ICD-10-CM | POA: Insufficient documentation

## 2015-09-24 DIAGNOSIS — I351 Nonrheumatic aortic (valve) insufficiency: Secondary | ICD-10-CM | POA: Diagnosis not present

## 2015-09-24 DIAGNOSIS — Z6828 Body mass index (BMI) 28.0-28.9, adult: Secondary | ICD-10-CM | POA: Diagnosis not present

## 2015-09-24 DIAGNOSIS — I110 Hypertensive heart disease with heart failure: Secondary | ICD-10-CM | POA: Diagnosis not present

## 2015-09-24 DIAGNOSIS — Z79899 Other long term (current) drug therapy: Secondary | ICD-10-CM | POA: Diagnosis not present

## 2015-09-24 DIAGNOSIS — I200 Unstable angina: Secondary | ICD-10-CM

## 2015-09-24 DIAGNOSIS — Z951 Presence of aortocoronary bypass graft: Secondary | ICD-10-CM | POA: Insufficient documentation

## 2015-09-24 DIAGNOSIS — I25119 Atherosclerotic heart disease of native coronary artery with unspecified angina pectoris: Secondary | ICD-10-CM

## 2015-09-24 DIAGNOSIS — N400 Enlarged prostate without lower urinary tract symptoms: Secondary | ICD-10-CM | POA: Diagnosis not present

## 2015-09-24 DIAGNOSIS — I25110 Atherosclerotic heart disease of native coronary artery with unstable angina pectoris: Secondary | ICD-10-CM | POA: Diagnosis not present

## 2015-09-24 DIAGNOSIS — M1990 Unspecified osteoarthritis, unspecified site: Secondary | ICD-10-CM | POA: Diagnosis not present

## 2015-09-24 DIAGNOSIS — K219 Gastro-esophageal reflux disease without esophagitis: Secondary | ICD-10-CM | POA: Diagnosis not present

## 2015-09-24 DIAGNOSIS — I2510 Atherosclerotic heart disease of native coronary artery without angina pectoris: Secondary | ICD-10-CM | POA: Diagnosis present

## 2015-09-24 DIAGNOSIS — N529 Male erectile dysfunction, unspecified: Secondary | ICD-10-CM | POA: Insufficient documentation

## 2015-09-24 DIAGNOSIS — Z794 Long term (current) use of insulin: Secondary | ICD-10-CM | POA: Insufficient documentation

## 2015-09-24 HISTORY — PX: CARDIAC CATHETERIZATION: SHX172

## 2015-09-24 LAB — GLUCOSE, CAPILLARY: GLUCOSE-CAPILLARY: 205 mg/dL — AB (ref 65–99)

## 2015-09-24 SURGERY — RIGHT HEART CATH AND CORONARY/GRAFT ANGIOGRAPHY
Anesthesia: Moderate Sedation

## 2015-09-24 MED ORDER — OXYCODONE-ACETAMINOPHEN 5-325 MG PO TABS
1.0000 | ORAL_TABLET | ORAL | Status: DC | PRN
Start: 2015-09-24 — End: 2015-09-24

## 2015-09-24 MED ORDER — SODIUM CHLORIDE 0.9% FLUSH: 3.0000 mL | INTRAVENOUS | Status: DC | PRN

## 2015-09-24 MED ORDER — SODIUM CHLORIDE 0.9 % IV SOLN
INTRAVENOUS | Status: DC
  Administered 2015-09-24: 09:00:00 via INTRAVENOUS

## 2015-09-24 MED ORDER — ACETAMINOPHEN 325 MG PO TABS: 650.0000 mg | ORAL_TABLET | ORAL | Status: DC | PRN

## 2015-09-24 MED ORDER — FENTANYL CITRATE (PF) 100 MCG/2ML IJ SOLN
INTRAMUSCULAR | Status: AC
  Filled 2015-09-24: qty 2

## 2015-09-24 MED ORDER — HYDRALAZINE HCL 20 MG/ML IJ SOLN
INTRAMUSCULAR | Status: DC | PRN
  Administered 2015-09-24: 10 mg via INTRAVENOUS

## 2015-09-24 MED ORDER — MIDAZOLAM HCL 2 MG/2ML IJ SOLN
INTRAMUSCULAR | Status: AC
  Filled 2015-09-24: qty 2

## 2015-09-24 MED ORDER — HYDROMORPHONE HCL 1 MG/ML IJ SOLN
1.0000 mg | Freq: Once | INTRAMUSCULAR | Status: AC
  Administered 2015-09-24: 1 mg via INTRAVENOUS

## 2015-09-24 MED ORDER — HYDROMORPHONE HCL 1 MG/ML IJ SOLN
INTRAMUSCULAR | Status: AC
  Administered 2015-09-24: 1 mg via INTRAVENOUS
  Filled 2015-09-24: qty 1

## 2015-09-24 MED ORDER — IOPAMIDOL (ISOVUE-300) INJECTION 61%
INTRAVENOUS | Status: DC | PRN
  Administered 2015-09-24: 250 mL via INTRA_ARTERIAL

## 2015-09-24 MED ORDER — ONDANSETRON HCL 4 MG/2ML IJ SOLN
INTRAMUSCULAR | Status: AC
  Administered 2015-09-24: 4 mg via INTRAVENOUS
  Filled 2015-09-24: qty 2

## 2015-09-24 MED ORDER — MIDAZOLAM HCL 2 MG/2ML IJ SOLN
INTRAMUSCULAR | Status: DC | PRN
  Administered 2015-09-24 (×2): 1 mg via INTRAVENOUS

## 2015-09-24 MED ORDER — HYDRALAZINE HCL 20 MG/ML IJ SOLN
INTRAMUSCULAR | Status: AC
  Filled 2015-09-24: qty 1

## 2015-09-24 MED ORDER — SODIUM CHLORIDE 0.9 % WEIGHT BASED INFUSION: 3.0000 mL/kg/h | INTRAVENOUS | Status: DC

## 2015-09-24 MED ORDER — ONDANSETRON HCL 4 MG/2ML IJ SOLN
4.0000 mg | Freq: Once | INTRAMUSCULAR | Status: AC
  Administered 2015-09-24: 4 mg via INTRAVENOUS

## 2015-09-24 MED ORDER — ASPIRIN 81 MG PO CHEW: 81.0000 mg | CHEWABLE_TABLET | ORAL | Status: DC

## 2015-09-24 MED ORDER — HEPARIN (PORCINE) IN NACL 2-0.9 UNIT/ML-% IJ SOLN
INTRAMUSCULAR | Status: AC
  Filled 2015-09-24: qty 1000

## 2015-09-24 MED ORDER — FENTANYL CITRATE (PF) 100 MCG/2ML IJ SOLN
INTRAMUSCULAR | Status: DC | PRN
  Administered 2015-09-24 (×2): 25 ug via INTRAVENOUS
  Administered 2015-09-24: 50 ug via INTRAVENOUS

## 2015-09-24 MED ORDER — ONDANSETRON HCL 4 MG/2ML IJ SOLN: 4.0000 mg | Freq: Four times a day (QID) | INTRAMUSCULAR | Status: DC | PRN

## 2015-09-24 MED ORDER — MORPHINE SULFATE (PF) 2 MG/ML IV SOLN: 2.0000 mg | INTRAVENOUS | Status: DC | PRN

## 2015-09-24 SURGICAL SUPPLY — 16 items
CANNULA 5F STIFF (CANNULA) ×3 IMPLANT
CATH INFINITI 5 FR IM (CATHETERS) ×3 IMPLANT
CATH INFINITI 5FR ANG PIGTAIL (CATHETERS) ×3 IMPLANT
CATH INFINITI 5FR JL4 (CATHETERS) ×3 IMPLANT
CATH INFINITI JR4 5F (CATHETERS) ×6 IMPLANT
CATH SWANZ 7F THERMO (CATHETERS) ×3 IMPLANT
KIT MANI 3VAL PERCEP (MISCELLANEOUS) ×3 IMPLANT
KIT RIGHT HEART (MISCELLANEOUS) ×3 IMPLANT
NEEDLE PERC 18GX7CM (NEEDLE) ×3 IMPLANT
PACK CARDIAC CATH (CUSTOM PROCEDURE TRAY) ×3 IMPLANT
SHEATH AVANTI 5FR X 11CM (SHEATH) ×3 IMPLANT
SHEATH BRITE TIP 4FRX11 (SHEATH) ×3 IMPLANT
SHEATH PINNACLE 5F 10CM (SHEATH) ×6 IMPLANT
SHEATH PINNACLE 7F 10CM (SHEATH) ×3 IMPLANT
WIRE EMERALD 3MM-J .035X150CM (WIRE) ×6 IMPLANT
WIRE EMERALD 3MM-J .035X260CM (WIRE) ×3 IMPLANT

## 2015-09-24 NOTE — H&P (Signed)
Patient ID: [name] [name], male DOB: 1933-10-02, 82 y.o. MRN: [id number]  HPI Comments: 82 year old gentleman with a history of coronary artery disease, bypass surgery in November 2006, history of diabetes, obesity, hyperlipidemia, hypertension, erectile dysfunction who presents for cardiac cath, recent sx consistent with unstable angina  On his last clinic visit, he reported having shortness of breath on exertion Nuclear Stress test was ordered to rule out ischemia, images were reviewed by myself, this did not show any large regions of ischemia He was recommended that he try Lasix for possible diastolic heart failure.  Despite diuresis, he has continued to have SOB on exertion. He reports sx are similar to his previous angina symptoms Previously had high exercise tolerance, now unable to exert himself.   Other past medical history reviewed On his last clinic visit, he reported worsening shortness of breath over the past 6 months  Previous echocardiogram in 2015 which was essentially normal  he denies any leg edema  CT scan of his abdomen reviewed showing mild diffuse aortic atherosclerosis Review of lab work showing elevated glucose levels but does report hemoglobin A1c 6.9  Reports he saw specialist for his back in [place], had MRI showing arthritis no spinal stenosis He is considering starting meloxicam Was previously on Celebrex. This is more expensive  Previously took a trip [place] with long car trip for 10 hours x2 days each way. He had significant lower extremity edema. He took additional doses of Lasix with improvement of his swelling. He reports having chronic swelling of the left lower extremity, swelling of the right lower extremity has resolved. Doppler of the lower extremity on the left showed no DVT  Periodically has subxiphoid pain that is sharp, unclear if it is associated with food. No longer taking a statin as this caused leg cramps He continues to bike  3-6 miles per day. Unable to walk long distances secondary to chronic knee and hip pain  Hemoglobin A1c 6.7. participating in a study at [facility] where they manage his insulin . 6 Total cholesterol 112, LDL 61 with no cholesterol medication  catheterization for chest pain 06/05/2013 that showed patent grafts with LIMA to the LAD, vein graft to the diagonal, vein graft to the OM 3, vein graft to the distal RCA. Normal filling pressures and normal LV function.  Echocardiogram April 2009 shows normal systolic function with mild LVH, diastolic relaxation abnormality, mildly enlarged left atrium, mild aortic insufficiency. Cardiac CT scan in March 2009 showed severe coronary artery disease with patent grafts x4, saphenous vein graft to the PDA, OM, diagonal and a LIMA graft to the LAD.  No Known Allergies  Outpatient Encounter Prescriptions as of 06/08/2015  Medication Sig  . acetaminophen (TYLENOL) 650 MG CR tablet Take 650 mg by mouth every 8 (eight) hours as needed for pain. For hand arthritis  . Ascorbic Acid (VITAMIN C) 100 MG tablet Take 100 mg by mouth daily.  Marland Kitchen aspirin 81 MG tablet Take 162 mg by mouth daily.   Marland Kitchen atorvastatin (LIPITOR) 40 MG tablet Take 20 mg by mouth every other day.  . Biotin 1000 MCG tablet Take 1,000 mcg by mouth daily.  . carvedilol (COREG) 12.5 MG tablet TAKE ONE TABLET TWICE A DAY WITH MEALS.  . finasteride (PROSCAR) 5 MG tablet Take 1 tablet (5 mg total) by mouth daily.  . furosemide (LASIX) 20 MG tablet Take 1 tablet (20 mg total) by mouth 2 (two) times daily as needed.  Marland Kitchen glimepiride (AMARYL) 1 MG  tablet TAKE 1/2 TABLET AM DAILY.  Marland Kitchen Glucosamine-Chondroit-Vit C-Mn (GLUCOSAMINE CHONDR 1500 COMPLX) CAPS Take by mouth daily.  Marland Kitchen LANTUS 100 UNIT/ML injection Inject 17 Units into the skin 2 (two) times daily.   . Multiple Vitamin (MULTIVITAMIN) tablet Take 1 tablet by mouth daily.  Marland Kitchen omeprazole (PRILOSEC) 20 MG capsule TAKE ONE  CAPSULE TWICE A DAY  . potassium chloride (K-DUR) 10 MEQ tablet Take 1 tablet (10 mEq total) by mouth daily.  . ramipril (ALTACE) 10 MG capsule TAKE ONE CAPSULE DAILY  . traMADol (ULTRAM) 50 MG tablet Take 50 mg by mouth as needed.    No facility-administered encounter medications on file as of 06/08/2015.    Past Medical History  Diagnosis Date  . Coronary artery disease     a. 4v CABG 01/2005; b. cath 06/05/13 showed patent grafts: LIMA-LAD, VG-D, VG-OM3, VG-dRCA, nl filling pressures, nl LV fxn  . Hyperlipidemia   . Hypertension   . IDDM (insulin dependent diabetes mellitus) ([place]) 2000  . Hypertrophy of prostate without urinary obstruction and other lower urinary tract symptoms (LUTS)   . GERD (gastroesophageal reflux disease)   . Perirectal fistula   . Cataract   . ED (erectile dysfunction)   . Osteoarthritis   . Diastolic dysfunction     a. echo 0000000: nl systolic fxn, mild LVH, diastolic relaxation abnormality, mildly enlarged LA, mild Ao insufficiency    Past Surgical History  Procedure Laterality Date  . Coronary artery bypass graft  2007    x 4  . Foot surgery  2012    right foot  . Lumbar laminectomy  1989  . Joint replacement  8/10    Right THR--[name]  . Colonoscopy w/ polypectomy    . Cholecystectomy  05/15/2011    Procedure: LAPAROSCOPIC CHOLECYSTECTOMY; Surgeon: Rolm Bookbinder, MD; Location: [place]; Service: General; Laterality: N/A;  . Cataract extraction  sept 2013    right  . Cardiac [facility]  [facility]   Marland Kitchen Left and right heart catheterization with coronary/graft angiogram N/A 06/05/2013    Procedure: LEFT AND RIGHT HEART CATHETERIZATION WITH Beatrix Fetters; Surgeon: Peter M Martinique, MD; Location: [facility] C[facility]; Service: Cardiovascular; Laterality: N/A;  . Prostate photovaporization  5/16    Dr  Budd Palmer    Social History  reports that he has never smoked. He has never used smokeless tobacco. He reports that he drinks alcohol. He reports that he does not use illicit drugs.  Family History family history includes Heart disease in his father; Lung cancer in his mother. There is no history of Colon cancer.   Review of Systems  Constitutional: Negative.  Respiratory: Positive for shortness of breath.  Gastrointestinal: Negative.  Musculoskeletal: Positive for back pain and arthralgias.  Skin: Negative.  Neurological: Negative.  Hematological: Negative.  Psychiatric/Behavioral: Negative.  All other systems reviewed and are negative.  Previous vitals BP 140/72 mmHg  Pulse 69  Ht 6\' 2"  (1.88 m)  Wt 223 lb 4 oz (101.266 kg)  BMI 28.65 kg/m2  Physical Exam  Constitutional: He is oriented to person, place, and time. He appears well-developed and well-nourished.  Obese  HENT:  Head: Normocephalic.  Nose: Nose normal.  Mouth/Throat: Oropharynx is clear and moist.  Eyes: Conjunctivae are normal. Pupils are equal, round, and reactive to light.  Neck: Normal range of motion. Neck supple. No JVD present.  Cardiovascular: Normal rate, regular rhythm, S1 normal, S2 normal, normal heart sounds and intact distal pulses. Exam reveals no gallop  and no friction rub.  No murmur heard. Pulmonary/Chest: Effort normal and breath sounds normal. No respiratory distress. He has no wheezes. He has no rales. He exhibits no tenderness.  Abdominal: Soft. Bowel sounds are normal. He exhibits no distension. There is no tenderness.  Musculoskeletal: Normal range of motion. He exhibits no edema or tenderness.  Lymphadenopathy:   He has no cervical adenopathy.  Neurological: He is alert and oriented to person, place, and time. Coordination normal.  Skin: Skin is warm and dry. No rash noted. No erythema.  Psychiatric: He has a normal mood and affect. His behavior is normal. Judgment  and thought content normal.      Assessment and Plan Nursing note and vitals reviewed.             HYPERTENSION, BENIGN -1:13 PM     Status: Written Related Problem: HYPERTENSION, BENIGN   Expand All Collapse All   Blood pressure is well controlled            Coronary atherosclerosis of native coronary artery -     Status: Written Related Problem: Coronary atherosclerosis of native coronary artery   Expand All Collapse All   Currently with SOB consistent with his previous angina sx, Worsening over the past year, He did previous stress with no ischemia, but has continued to have worsening sx. Will schedule for cardiac cath He is aware of risk and complications including CVA and MI and willing to proceed            Chronic diastolic CHF (congestive heart failure) -    Status: Written Related Problem: Chronic diastolic CHF (congestive heart failure)   Expand All Collapse All   SOB sx as above,             Diabetes mellitus type 2, controlled, with complications -     Status: Written Related Problem: Diabetes mellitus type 2, controlled, with complications   Expand All Collapse All   We have encouraged continued exercise, careful diet management in an effort to lose weight. Stressed the importance of hemoglobin A1c in the low 6 range            Hyperlipidemia -     Status: Written Related Problem: Hyperlipidemia   Expand All Collapse All   Cholesterol is at goal on the current lipid regimen. No changes to the medications were made.            S/P CABG (coronary artery bypass graft) -    Status: Written Related Problem: S/P CABG (coronary artery bypass graft)   Expand All Collapse All   Sx as above, cardiac cath planned             Shortness of breath -     Status: Written Related Problem: Shortness of breath   Expand All Collapse All   Long discussion with him concerning conditioning, need for weight  loss, various types of exercise he can do. Cardiac cath to rule out ischemia as a cause of his unstable angina/SOB sx            Nerve pain -     Status: Edited Related Problem: Nerve pain   Expand All Collapse All   Radiating nerve pain around his left flank, concerning for nerve entrapment or shingles He has follow-up at [place] discussion concerning various etiologies of his discomfort

## 2015-09-24 NOTE — Progress Notes (Signed)
Patient has chronic back pain. Pain increased due to having to lay flat. Patient given dilaudid for pain. Will reassess at 1400. MD made aware of patient's pain and increased BP. Will place orders to address both. Wife at bedside.

## 2015-09-24 NOTE — Discharge Instructions (Signed)
Groin Insertion Instructions-If you lose feeling or develop tingling or pain in your leg or foot after the procedure, please walk around first.  If the discomfort does not improve , contact your physician and proceed to the nearest emergency room.  Loss of feeling in your leg might mean that a blockage has formed in the artery and this can be appropriately treated.  Limit your activity for the next two days after your procedure.  Avoid stooping, bending, heavy lifting or exertion as this may put pressure on the insertion site.  Resume normal activities in 48 hours.  You may shower after 24 hours but avoid excessive warm water and do not scrub the site.  Remove clear dressing in 48 hours.  If you have had a closure device inserted, do not soak in a tub bath or a hot tub for at least one week.  No driving for 48 hours after discharge.  After the procedure, check the insertion site occasionally.  If any oozing occurs or there is apparent swelling, firm pressure over the site will prevent a bruise from forming.  You can not hurt anything by pressing directly on the site.  The pressure stops the bleeding by allowing a small clot to form.  If the bleeding continues after the pressure has been applied for more than 15 minutes, call 911 or go to the nearest emergency room.

## 2015-09-25 ENCOUNTER — Encounter: Payer: Self-pay | Admitting: Cardiovascular Disease

## 2015-09-28 ENCOUNTER — Other Ambulatory Visit: Payer: Self-pay

## 2015-09-28 DIAGNOSIS — I5032 Chronic diastolic (congestive) heart failure: Secondary | ICD-10-CM

## 2015-09-29 ENCOUNTER — Other Ambulatory Visit: Payer: Self-pay | Admitting: Cardiovascular Disease

## 2015-10-05 ENCOUNTER — Encounter: Payer: Medicare Other | Attending: Cardiovascular Disease | Admitting: *Deleted

## 2015-10-05 VITALS — Ht 71.8 in | Wt 216.7 lb

## 2015-10-05 DIAGNOSIS — Z951 Presence of aortocoronary bypass graft: Secondary | ICD-10-CM | POA: Diagnosis not present

## 2015-10-05 DIAGNOSIS — I5032 Chronic diastolic (congestive) heart failure: Secondary | ICD-10-CM | POA: Diagnosis not present

## 2015-10-05 DIAGNOSIS — I2089 Other forms of angina pectoris: Secondary | ICD-10-CM

## 2015-10-05 DIAGNOSIS — I208 Other forms of angina pectoris: Secondary | ICD-10-CM

## 2015-10-05 NOTE — Patient Instructions (Signed)
Patient Instructions  Patient Details  Name: [name] [name] MRN: [id number] Date of Birth: December 06, 1933 Referring Provider:  Minna Merritts, MD  Below are the personal goals you chose as well as exercise and nutrition goals. Our goal is to help you keep on track towards obtaining and maintaining your goals. We will be discussing your progress on these goals with you throughout the program.  Initial Exercise Prescription:     Initial Exercise Prescription - 10/05/15 1500      Date of Initial Exercise RX and Referring Provider   Date 10/05/15   Referring Provider Ida Rogue MD     Treadmill   MPH 1.4   Grade 0   Minutes 15   METs 2.07     Recumbant Elliptical   Level 1   RPM 50   Minutes 15   METs 2     T5 Nustep   Level 1   Minutes 15   METs 2     Prescription Details   Frequency (times per week) 3     Intensity   THRR 40-80% of Max Heartrate 95-126   Ratings of Perceived Exertion 11-15   Perceived Dyspnea 0-4     Progression   Progression Continue to progress workloads to maintain intensity without signs/symptoms of physical distress.     Resistance Training   Training Prescription Yes   Weight 2 lbs   Reps 10-15      Exercise Goals: Frequency: Be able to perform aerobic exercise three times per week working toward 3-5 days per week.  Intensity: Work with a perceived exertion of 11 (fairly light) - 15 (hard) as tolerated. Follow your new exercise prescription and watch for changes in prescription as you progress with the program. Changes will be reviewed with you when they are made.  Duration: You should be able to do 30 minutes of continuous aerobic exercise in addition to a 5 minute warm-up and a 5 minute cool-down routine.  Nutrition Goals: Your personal nutrition goals will be established when you do your nutrition analysis with the dietician.  The following are nutrition guidelines to follow: Cholesterol < 200mg /day Sodium <  1500mg /day Fiber: Men over 50 yrs - 30 grams per day  Personal Goals:     Personal Goals and Risk Factors at Admission - 10/05/15 1509      Core Components/Risk Factors/Patient Goals on Admission    Weight Management Yes;Weight Loss;Obesity   Intervention Weight Management: Develop a combined nutrition and exercise program designed to reach desired caloric intake, while maintaining appropriate intake of nutrient and fiber, sodium and fats, and appropriate energy expenditure required for the weight goal.;Weight Management: Provide education and appropriate resources to help participant work on and attain dietary goals.;Weight Management/Obesity: Establish reasonable short term and long term weight goals.;Obesity: Provide education and appropriate resources to help participant work on and attain dietary goals.   Admit Weight 216 lb 11.2 oz (98.3 kg)   Goal Weight: Short Term 210 lb (95.3 kg)   Goal Weight: Long Term 200 lb (90.7 kg)   Expected Outcomes Long Term: Adherence to nutrition and physical activity/exercise program aimed toward attainment of established weight goal;Short Term: Continue to assess and modify interventions until short term weight is achieved;Weight Loss: Understanding of general recommendations for a balanced deficit meal plan, which promotes 1-2 lb weight loss per week and includes a negative energy balance of [phone] kcal/d;Understanding recommendations for meals to include 15-35% energy as protein, 25-35% energy from fat, 35-60% energy  from carbohydrates, less than 200mg  of dietary cholesterol, 20-35 gm of total fiber daily;Understanding of distribution of calorie intake throughout the day with the consumption of 4-5 meals/snacks   Increase Strength and Stamina Yes   Intervention Provide advice, education, support and counseling about physical activity/exercise needs.;Develop an individualized exercise prescription for aerobic and resistive training based on initial  evaluation findings, risk stratification, comorbidities and participant's personal goals.   Expected Outcomes Achievement of increased cardiorespiratory fitness and enhanced flexibility, muscular endurance and strength shown through measurements of functional capacity and personal statement of participant.   Diabetes Yes   Intervention Provide education about signs/symptoms and action to take for hypo/hyperglycemia.;Provide education about proper nutrition, including hydration, and aerobic/resistive exercise prescription along with prescribed medications to achieve blood glucose in normal ranges: Fasting glucose 65-99 mg/dL   Expected Outcomes Short Term: Participant verbalizes understanding of the signs/symptoms and immediate care of hyper/hypoglycemia, proper foot care and importance of medication, aerobic/resistive exercise and nutrition plan for blood glucose control.;Long Term: Attainment of HbA1C < 7%.   Heart Failure Yes   Intervention Provide a combined exercise and nutrition program that is supplemented with education, support and counseling about heart failure. Directed toward relieving symptoms such as shortness of breath, decreased exercise tolerance, and extremity edema.   Expected Outcomes Improve functional capacity of life;Short term: Attendance in program 2-3 days a week with increased exercise capacity. Reported lower sodium intake. Reported increased fruit and vegetable intake. Reports medication compliance.;Short term: Daily weights obtained and reported for increase. Utilizing diuretic protocols set by physician.;Long term: Adoption of self-care skills and reduction of barriers for early signs and symptoms recognition and intervention leading to self-care maintenance.   Hypertension Yes   Intervention Provide education on lifestyle modifcations including regular physical activity/exercise, weight management, moderate sodium restriction and increased consumption of fresh fruit,  vegetables, and low fat dairy, alcohol moderation, and smoking cessation.;Monitor prescription use compliance.   Expected Outcomes Short Term: Continued assessment and intervention until BP is < 140/2mm HG in hypertensive participants. < 130/1mm HG in hypertensive participants with diabetes, heart failure or chronic kidney disease.;Long Term: Maintenance of blood pressure at goal levels.   Lipids Yes   Intervention Provide education and support for participant on nutrition & aerobic/resistive exercise along with prescribed medications to achieve LDL 70mg , HDL >40mg .   Expected Outcomes Short Term: Participant states understanding of desired cholesterol values and is compliant with medications prescribed. Participant is following exercise prescription and nutrition guidelines.;Long Term: Cholesterol controlled with medications as prescribed, with individualized exercise RX and with personalized nutrition plan. Value goals: LDL < 70mg , HDL > 40 mg.   Personal Goal Other Yes   Personal Goal Breathe better during exercise   Intervention Provide education and exercise prescription and include tips for pursed lip breathing   Expected Outcomes Able to feel less short of breath with exercise.      Tobacco Use Initial Evaluation: History  Smoking Status  . Never Smoker  Smokeless Tobacco  . Never Used    Copy of goals given to participant.

## 2015-10-05 NOTE — Progress Notes (Signed)
Cardiac Individual Treatment Plan  Patient Details  Name: [name] [name] MRN: [id number] Date of Birth: Nov 20, 1933 Referring Provider:   Flowsheet Row Cardiac Rehab from 10/05/2015 in [facility] Cardiac and Pulmonary Rehab  Referring Provider  Ida Rogue MD      Initial Encounter Date:  Flowsheet Row Cardiac Rehab from 10/05/2015 in [facility] Cardiac and Pulmonary Rehab  Date  10/05/15  Referring Provider  Ida Rogue MD      Visit Diagnosis: Stable angina [facility])  Patient's Home Medications on Admission:  Current Outpatient Prescriptions:  .  acetaminophen (TYLENOL) 650 MG CR tablet, Take 650 mg by mouth every 8 (eight) hours as needed for pain. For hand arthritis, Disp: , Rfl:  .  aspirin 81 MG tablet, Take 162 mg by mouth daily. , Disp: , Rfl:  .  atorvastatin (LIPITOR) 40 MG tablet, Take 20 mg by mouth every other day., Disp: , Rfl:  .  BD INSULIN SYRINGE ULTRAFINE 31G X 15/64" 0.3 ML MISC, USE AS DIRECTED, Disp: 100 each, Rfl: 3 .  Biotin 1000 MCG tablet, Take 1,000 mcg by mouth daily., Disp: , Rfl:  .  carvedilol (COREG) 12.5 MG tablet, TAKE ONE TABLET TWICE A DAY WITH MEALS., Disp: 180 tablet, Rfl: 3 .  finasteride (PROSCAR) 5 MG tablet, Take 1 tablet (5 mg total) by mouth daily., Disp: 90 tablet, Rfl: 1 .  furosemide (LASIX) 20 MG tablet, Take 1 tablet (20 mg total) by mouth 2 (two) times daily as needed. (Patient taking differently: Take 20 mg by mouth daily. ), Disp: 60 tablet, Rfl: 6 .  glimepiride (AMARYL) 1 MG tablet, TAKE 1/2 TABLET AM DAILY., Disp: , Rfl:  .  LANTUS 100 UNIT/ML injection, Inject 18 Units into the skin 2 (two) times daily. , Disp: , Rfl:  .  meloxicam (MOBIC) 15 MG tablet, Take 15 mg by mouth daily. , Disp: , Rfl:  .  omeprazole (PRILOSEC) 20 MG capsule, TAKE ONE CAPSULE TWICE A DAY, Disp: 60 capsule, Rfl: 11 .  potassium chloride (K-DUR) 10 MEQ tablet, Take 1 tablet (10 mEq total) by mouth daily., Disp: 30 tablet, Rfl: 6 .  ramipril (ALTACE) 10 MG  capsule, TAKE ONE CAPSULE DAILY, Disp: 30 capsule, Rfl: 6 .  traMADol (ULTRAM) 50 MG tablet, Take 50 mg by mouth as needed. , Disp: , Rfl:   Past Medical History: Past Medical History:  Diagnosis Date  . Cataract   . Coronary artery disease    a. 4v CABG 01/2005; b. cath 06/05/13 showed patent grafts: LIMA-LAD, VG-D, VG-OM3, VG-dRCA, nl filling pressures, nl LV fxn  . Diastolic dysfunction    a. echo 0000000: nl systolic fxn, mild LVH, diastolic relaxation abnormality, mildly enlarged LA, mild Ao insufficiency  . ED (erectile dysfunction)   . GERD (gastroesophageal reflux disease)   . Hyperlipidemia   . Hypertension   . Hypertrophy of prostate without urinary obstruction and other lower urinary tract symptoms (LUTS)   . IDDM (insulin dependent diabetes mellitus) ([place]) 2000  . Osteoarthritis   . Perirectal fistula   . Tubular adenoma of colon 07/2012    Tobacco Use: History  Smoking Status  . Never Smoker  Smokeless Tobacco  . Never Used    Labs: Recent Review Flowsheet Data    Labs for ITP Cardiac and Pulmonary Rehab Latest Ref Rng & Units 07/30/2013 11/05/2013 07/04/2014 11/07/2014 02/06/2015   Cholestrol 0 - 200 mg/dL 112 - 172 - 111   LDLCALC 0 - 99 mg/dL 61 - 110(H) -  61   HDL >39.00 mg/dL 39(L) - 39(L) - 31.90(L)   Trlycerides 0.0 - 149.0 mg/dL 59 - 115 - 92.0   Hemoglobin A1c 4.0 - 6.0 % - 6.5(A) - 7.0(A) -   PHART 7.350 - 7.450 - - - - -   PCO2ART 35.0 - 45.0 mmHg - - - - -   HCO3 20.0 - 24.0 mEq/L - - - - -   TCO2 0 - 100 mmol/L - - - - -   O2SAT % - - - - -       Exercise Target Goals: Date: 10/05/15  Exercise Program Goal: Individual exercise prescription set with THRR, safety & activity barriers. Participant demonstrates ability to understand and report RPE using BORG scale, to self-measure pulse accurately, and to acknowledge the importance of the exercise prescription.  Exercise Prescription Goal: Starting with aerobic activity 30 plus minutes a day, 3 days per  week for initial exercise prescription. Provide home exercise prescription and guidelines that participant acknowledges understanding prior to discharge.  Activity Barriers & Risk Stratification:     Activity Barriers & Cardiac Risk Stratification - 10/05/15 1526      Activity Barriers & Cardiac Risk Stratification   Activity Barriers --  Has had pain arounf edge of rib cage and towards his back since Jan this year.  Been to several doctors without a diagnosis. Last 2 weeks had PT with stretching and accupuncture with some relief. Has Pain clinic appt 8/1 in [facility]      6 Minute Walk:     [place] Name 10/05/15 1501         6 Minute Walk   Phase Initial     Distance 1165 feet     Walk Time 6 minutes     # of Rest Breaks 0     MPH 2.21     METS 2.08     RPE 12     VO2 Peak 7.28     Symptoms No     Resting HR 64 bpm     Resting BP 126/70     Max Ex. HR 87 bpm     Max Ex. BP 146/70     2 Minute Post BP 134/70        Initial Exercise Prescription:     Initial Exercise Prescription - 10/05/15 1500      Date of Initial Exercise RX and Referring Provider   Date 10/05/15   Referring Provider Ida Rogue MD     Treadmill   MPH 1.4   Grade 0   Minutes 15   METs 2.07     Recumbant Elliptical   Level 1   RPM 50   Minutes 15   METs 2     T5 Nustep   Level 1   Minutes 15   METs 2     Prescription Details   Frequency (times per week) 3     Intensity   THRR 40-80% of Max Heartrate 95-126   Ratings of Perceived Exertion 11-15   Perceived Dyspnea 0-4     Progression   Progression Continue to progress workloads to maintain intensity without signs/symptoms of physical distress.     Resistance Training   Training Prescription Yes   Weight 2 lbs   Reps 10-15      Perform Capillary Blood Glucose checks as needed.  Exercise Prescription Changes:     Exercise Prescription Changes    Row Name  10/05/15 1300             Response  to Exercise   Blood Pressure (Admit) 126/70       Blood Pressure (Exercise) 146/70       Blood Pressure (Exit) 134/70       Heart Rate (Admit) 62 bpm       Heart Rate (Exercise) 98 bpm       Heart Rate (Exit) 64 bpm       Rating of Perceived Exertion (Exercise) 12          Exercise Comments:   Discharge Exercise Prescription (Final Exercise Prescription Changes):     Exercise Prescription Changes - 10/05/15 1300      Response to Exercise   Blood Pressure (Admit) 126/70   Blood Pressure (Exercise) 146/70   Blood Pressure (Exit) 134/70   Heart Rate (Admit) 62 bpm   Heart Rate (Exercise) 98 bpm   Heart Rate (Exit) 64 bpm   Rating of Perceived Exertion (Exercise) 12      Nutrition:  Target Goals: Understanding of nutrition guidelines, daily intake of sodium 1500mg , cholesterol 200mg , calories 30% from fat and 7% or less from saturated fats, daily to have 5 or more servings of fruits and vegetables.  Biometrics:     Pre Biometrics - 10/05/15 1512      Pre Biometrics   Height 5' 11.8" (1.824 m)   Weight 216 lb 11.2 oz (98.3 kg)   Waist Circumference 45.25 inches   Hip Circumference 43 inches   Waist to Hip Ratio 1.05 %   BMI (Calculated) 29.6   Single Leg Stand 9.2 seconds       Nutrition Therapy Plan and Nutrition Goals:     Nutrition Therapy & Goals - 10/05/15 1521      Intervention Plan   Intervention Prescribe, educate and counsel regarding individualized specific dietary modifications aiming towards targeted core components such as weight, hypertension, lipid management, diabetes, heart failure and other comorbidities.   Expected Outcomes Short Term Goal: Understand basic principles of dietary content, such as calories, fat, sodium, cholesterol and nutrients.;Short Term Goal: A plan has been developed with personal nutrition goals set during dietitian appointment.;Long Term Goal: Adherence to prescribed nutrition plan.      Nutrition Discharge: Rate Your  Plate Scores:   Nutrition Goals Re-Evaluation:   Psychosocial: Target Goals: Acknowledge presence or absence of depression, maximize coping skills, provide positive support system. Participant is able to verbalize types and ability to use techniques and skills needed for reducing stress and depression.  Initial Review & Psychosocial Screening:     Initial Psych Review & Screening - 10/05/15 Joice? Yes     Barriers   Psychosocial barriers to participate in program The patient should benefit from training in stress management and relaxation.;There are no identifiable barriers or psychosocial needs.     Screening Interventions   Interventions Encouraged to exercise      Quality of Life Scores:     Quality of Life - 10/05/15 1521      Quality of Life Scores   Health/Function Pre 24.86 %   Socioeconomic Pre 30 %   Psych/Spiritual Pre 30 %   Family Pre 25.2 %   GLOBAL Pre 27.18 %      PHQ-9: Recent Review Flowsheet Data    Depression screen [facility] 2/9 10/05/2015 02/06/2015 02/05/2014   Decreased Interest 0 0 0   Down,  Depressed, Hopeless 0 0 0   PHQ - 2 Score 0 0 0   Altered sleeping 1 - -   Tired, decreased energy 2 - -   Change in appetite 2 - -   Feeling bad or failure about yourself  0 - -   Trouble concentrating 0 - -   Moving slowly or fidgety/restless 0 - -   Suicidal thoughts 0 - -   PHQ-9 Score 5 - -   Difficult doing work/chores Not difficult at all - -      Psychosocial Evaluation and Intervention:   Psychosocial Re-Evaluation:   Vocational Rehabilitation: Provide vocational rehab assistance to qualifying candidates.   Vocational Rehab Evaluation & Intervention:     Vocational Rehab - 10/05/15 1528      Initial Vocational Rehab Evaluation & Intervention   Assessment shows need for Vocational Rehabilitation No      Education: Education Goals: Education classes will be provided on a weekly basis,  covering required topics. Participant will state understanding/return demonstration of topics presented.  Learning Barriers/Preferences:     Learning Barriers/Preferences - 10/05/15 1527      Learning Barriers/Preferences   Learning Barriers None   Learning Preferences None      Education Topics: General Nutrition Guidelines/Fats and Fiber: -Group instruction provided by verbal, written material, models and posters to present the general guidelines for heart healthy nutrition. Gives an explanation and review of dietary fats and fiber.   Controlling Sodium/Reading Food Labels: -Group verbal and written material supporting the discussion of sodium use in heart healthy nutrition. Review and explanation with models, verbal and written materials for utilization of the food label.   Exercise Physiology & Risk Factors: - Group verbal and written instruction with models to review the exercise physiology of the cardiovascular system and associated critical values. Details cardiovascular disease risk factors and the goals associated with each risk factor.   Aerobic Exercise & Resistance Training: - Gives group verbal and written discussion on the health impact of inactivity. On the components of aerobic and resistive training programs and the benefits of this training and how to safely progress through these programs.   Flexibility, Balance, General Exercise Guidelines: - Provides group verbal and written instruction on the benefits of flexibility and balance training programs. Provides general exercise guidelines with specific guidelines to those with heart or lung disease. Demonstration and skill practice provided.   Stress Management: - Provides group verbal and written instruction about the health risks of elevated stress, cause of high stress, and healthy ways to reduce stress.   Depression: - Provides group verbal and written instruction on the correlation between heart/lung  disease and depressed mood, treatment options, and the stigmas associated with seeking treatment.   Anatomy & Physiology of the Heart: - Group verbal and written instruction and models provide basic cardiac anatomy and physiology, with the coronary electrical and arterial systems. Review of: AMI, Angina, Valve disease, Heart Failure, Cardiac Arrhythmia, Pacemakers, and the ICD.   Cardiac Procedures: - Group verbal and written instruction and models to describe the testing methods done to diagnose heart disease. Reviews the outcomes of the test results. Describes the treatment choices: Medical Management, Angioplasty, or Coronary Bypass Surgery.   Cardiac Medications: - Group verbal and written instruction to review commonly prescribed medications for heart disease. Reviews the medication, class of the drug, and side effects. Includes the steps to properly store meds and maintain the prescription regimen.   Go Sex-Intimacy & Heart Disease, Get SMART -  Goal Setting: - Group verbal and written instruction through game format to discuss heart disease and the return to sexual intimacy. Provides group verbal and written material to discuss and apply goal setting through the application of the S.M.A.R.T. Method.   Other Matters of the Heart: - Provides group verbal, written materials and models to describe Heart Failure, Angina, Valve Disease, and Diabetes in the realm of heart disease. Includes description of the disease process and treatment options available to the cardiac patient.   Exercise & Equipment Safety: - Individual verbal instruction and demonstration of equipment use and safety with use of the equipment. Flowsheet Row Cardiac Rehab from 10/05/2015 in [facility] Cardiac and Pulmonary Rehab  Date  10/05/15  Educator  SB  Instruction Review Code  2- meets goals/outcomes      Infection Prevention: - Provides verbal and written material to individual with discussion of infection control  including proper hand washing and proper equipment cleaning during exercise session. Flowsheet Row Cardiac Rehab from 10/05/2015 in [facility] Cardiac and Pulmonary Rehab  Date  10/05/15  Educator  SB  Instruction Review Code  2- meets goals/outcomes      Falls Prevention: - Provides verbal and written material to individual with discussion of falls prevention and safety.   Diabetes: - Individual verbal and written instruction to review signs/symptoms of diabetes, desired ranges of glucose level fasting, after meals and with exercise. Advice that pre and post exercise glucose checks will be done for 3 sessions at entry of program. [place] from 10/05/2015 in [facility] Cardiac and Pulmonary Rehab  Date  10/05/15  Educator  SB  Instruction Review Code  2- meets goals/outcomes       Knowledge Questionnaire Score:     Knowledge Questionnaire Score - 10/05/15 1527      Knowledge Questionnaire Score   Pre Score 22/28      Core Components/Risk Factors/Patient Goals at Admission:     Personal Goals and Risk Factors at Admission - 10/05/15 1509      Core Components/Risk Factors/Patient Goals on Admission    Weight Management Yes;Weight Loss;Obesity   Intervention Weight Management: Develop a combined nutrition and exercise program designed to reach desired caloric intake, while maintaining appropriate intake of nutrient and fiber, sodium and fats, and appropriate energy expenditure required for the weight goal.;Weight Management: Provide education and appropriate resources to help participant work on and attain dietary goals.;Weight Management/Obesity: Establish reasonable short term and long term weight goals.;Obesity: Provide education and appropriate resources to help participant work on and attain dietary goals.   Admit Weight 216 lb 11.2 oz (98.3 kg)   Goal Weight: Short Term 210 lb (95.3 kg)   Goal Weight: Long Term 200 lb (90.7 kg)   Expected Outcomes Long Term:  Adherence to nutrition and physical activity/exercise program aimed toward attainment of established weight goal;Short Term: Continue to assess and modify interventions until short term weight is achieved;Weight Loss: Understanding of general recommendations for a balanced deficit meal plan, which promotes 1-2 lb weight loss per week and includes a negative energy balance of [id number] kcal/d;Understanding recommendations for meals to include 15-35% energy as protein, 25-35% energy from fat, 35-60% energy from carbohydrates, less than 200mg  of dietary cholesterol, 20-35 gm of total fiber daily;Understanding of distribution of calorie intake throughout the day with the consumption of 4-5 meals/snacks   Increase Strength and Stamina Yes   Intervention Provide advice, education, support and counseling about physical activity/exercise needs.;Develop an individualized exercise prescription for aerobic  and resistive training based on initial evaluation findings, risk stratification, comorbidities and participant's personal goals.   Expected Outcomes Achievement of increased cardiorespiratory fitness and enhanced flexibility, muscular endurance and strength shown through measurements of functional capacity and personal statement of participant.   Diabetes Yes   Intervention Provide education about signs/symptoms and action to take for hypo/hyperglycemia.;Provide education about proper nutrition, including hydration, and aerobic/resistive exercise prescription along with prescribed medications to achieve blood glucose in normal ranges: Fasting glucose 65-99 mg/dL   Expected Outcomes Short Term: Participant verbalizes understanding of the signs/symptoms and immediate care of hyper/hypoglycemia, proper foot care and importance of medication, aerobic/resistive exercise and nutrition plan for blood glucose control.;Long Term: Attainment of HbA1C < 7%.   Heart Failure Yes   Intervention Provide a combined exercise and  nutrition program that is supplemented with education, support and counseling about heart failure. Directed toward relieving symptoms such as shortness of breath, decreased exercise tolerance, and extremity edema.   Expected Outcomes Improve functional capacity of life;Short term: Attendance in program 2-3 days a week with increased exercise capacity. Reported lower sodium intake. Reported increased fruit and vegetable intake. Reports medication compliance.;Short term: Daily weights obtained and reported for increase. Utilizing diuretic protocols set by physician.;Long term: Adoption of self-care skills and reduction of barriers for early signs and symptoms recognition and intervention leading to self-care maintenance.   Hypertension Yes   Intervention Provide education on lifestyle modifcations including regular physical activity/exercise, weight management, moderate sodium restriction and increased consumption of fresh fruit, vegetables, and low fat dairy, alcohol moderation, and smoking cessation.;Monitor prescription use compliance.   Expected Outcomes Short Term: Continued assessment and intervention until BP is < 140/67mm HG in hypertensive participants. < 130/35mm HG in hypertensive participants with diabetes, heart failure or chronic kidney disease.;Long Term: Maintenance of blood pressure at goal levels.   Lipids Yes   Intervention Provide education and support for participant on nutrition & aerobic/resistive exercise along with prescribed medications to achieve LDL 70mg , HDL >40mg .   Expected Outcomes Short Term: Participant states understanding of desired cholesterol values and is compliant with medications prescribed. Participant is following exercise prescription and nutrition guidelines.;Long Term: Cholesterol controlled with medications as prescribed, with individualized exercise RX and with personalized nutrition plan. Value goals: LDL < 70mg , HDL > 40 mg.   Personal Goal Other Yes    Personal Goal Breathe better during exercise   Intervention Provide education and exercise prescription and include tips for pursed lip breathing   Expected Outcomes Able to feel less short of breath with exercise.      Core Components/Risk Factors/Patient Goals Review:    Core Components/Risk Factors/Patient Goals at Discharge (Final Review):    ITP Comments:     ITP Comments    Row Name 10/05/15 1519           ITP Comments Initial visit completed today ITP created.  Continue with ITP          Comments:

## 2015-10-06 DIAGNOSIS — G894 Chronic pain syndrome: Secondary | ICD-10-CM | POA: Diagnosis not present

## 2015-10-06 DIAGNOSIS — G8929 Other chronic pain: Secondary | ICD-10-CM | POA: Insufficient documentation

## 2015-10-06 DIAGNOSIS — M546 Pain in thoracic spine: Secondary | ICD-10-CM | POA: Diagnosis not present

## 2015-10-06 DIAGNOSIS — R208 Other disturbances of skin sensation: Secondary | ICD-10-CM | POA: Insufficient documentation

## 2015-10-06 DIAGNOSIS — M25561 Pain in right knee: Secondary | ICD-10-CM | POA: Diagnosis not present

## 2015-10-06 DIAGNOSIS — R0781 Pleurodynia: Secondary | ICD-10-CM | POA: Diagnosis not present

## 2015-10-06 DIAGNOSIS — M47816 Spondylosis without myelopathy or radiculopathy, lumbar region: Secondary | ICD-10-CM | POA: Diagnosis not present

## 2015-10-06 DIAGNOSIS — M25562 Pain in left knee: Secondary | ICD-10-CM | POA: Diagnosis not present

## 2015-10-16 DIAGNOSIS — M62561 Muscle wasting and atrophy, not elsewhere classified, right lower leg: Secondary | ICD-10-CM | POA: Diagnosis not present

## 2015-10-16 DIAGNOSIS — R1012 Left upper quadrant pain: Secondary | ICD-10-CM | POA: Diagnosis not present

## 2015-10-16 DIAGNOSIS — Z794 Long term (current) use of insulin: Secondary | ICD-10-CM | POA: Diagnosis not present

## 2015-10-16 DIAGNOSIS — M62562 Muscle wasting and atrophy, not elsewhere classified, left lower leg: Secondary | ICD-10-CM | POA: Diagnosis not present

## 2015-10-16 DIAGNOSIS — R208 Other disturbances of skin sensation: Secondary | ICD-10-CM | POA: Diagnosis not present

## 2015-10-16 DIAGNOSIS — Z888 Allergy status to other drugs, medicaments and biological substances status: Secondary | ICD-10-CM | POA: Diagnosis not present

## 2015-10-16 DIAGNOSIS — Z7982 Long term (current) use of aspirin: Secondary | ICD-10-CM | POA: Diagnosis not present

## 2015-10-16 DIAGNOSIS — G894 Chronic pain syndrome: Secondary | ICD-10-CM | POA: Diagnosis not present

## 2015-10-16 DIAGNOSIS — M1711 Unilateral primary osteoarthritis, right knee: Secondary | ICD-10-CM | POA: Diagnosis not present

## 2015-10-16 DIAGNOSIS — M47816 Spondylosis without myelopathy or radiculopathy, lumbar region: Secondary | ICD-10-CM | POA: Diagnosis not present

## 2015-10-16 DIAGNOSIS — M25461 Effusion, right knee: Secondary | ICD-10-CM | POA: Diagnosis not present

## 2015-10-20 ENCOUNTER — Ambulatory Visit: Payer: Medicare Other | Admitting: Nurse Practitioner

## 2015-10-21 ENCOUNTER — Ambulatory Visit: Payer: Medicare Other

## 2015-10-22 ENCOUNTER — Ambulatory Visit: Payer: Medicare Other

## 2015-10-22 DIAGNOSIS — H2512 Age-related nuclear cataract, left eye: Secondary | ICD-10-CM | POA: Diagnosis not present

## 2015-10-23 ENCOUNTER — Encounter: Payer: Self-pay | Admitting: Dietician

## 2015-10-26 ENCOUNTER — Ambulatory Visit: Payer: Medicare Other

## 2015-10-28 ENCOUNTER — Ambulatory Visit: Payer: Medicare Other

## 2015-10-29 ENCOUNTER — Ambulatory Visit: Payer: Medicare Other

## 2015-11-02 ENCOUNTER — Ambulatory Visit: Payer: Medicare Other

## 2015-11-03 DIAGNOSIS — M545 Low back pain: Secondary | ICD-10-CM | POA: Diagnosis not present

## 2015-11-04 ENCOUNTER — Ambulatory Visit: Payer: Medicare Other

## 2015-11-05 ENCOUNTER — Ambulatory Visit: Payer: Medicare Other

## 2015-11-06 DIAGNOSIS — M545 Low back pain: Secondary | ICD-10-CM | POA: Diagnosis not present

## 2015-11-11 ENCOUNTER — Ambulatory Visit: Payer: Medicare Other

## 2015-11-12 ENCOUNTER — Ambulatory Visit: Payer: Medicare Other

## 2015-11-16 ENCOUNTER — Ambulatory Visit: Payer: Medicare Other

## 2015-11-18 ENCOUNTER — Ambulatory Visit: Payer: Medicare Other

## 2015-11-19 ENCOUNTER — Ambulatory Visit: Payer: Medicare Other

## 2015-11-23 ENCOUNTER — Ambulatory Visit: Payer: Medicare Other

## 2015-11-23 ENCOUNTER — Encounter: Payer: Self-pay | Admitting: Cardiovascular Disease

## 2015-11-23 ENCOUNTER — Ambulatory Visit (INDEPENDENT_AMBULATORY_CARE_PROVIDER_SITE_OTHER): Payer: Medicare Other | Admitting: Cardiovascular Disease

## 2015-11-23 VITALS — BP 145/68 | HR 74 | Ht 73.0 in | Wt 217.5 lb

## 2015-11-23 DIAGNOSIS — I5032 Chronic diastolic (congestive) heart failure: Secondary | ICD-10-CM

## 2015-11-23 DIAGNOSIS — I2510 Atherosclerotic heart disease of native coronary artery without angina pectoris: Secondary | ICD-10-CM | POA: Diagnosis not present

## 2015-11-23 DIAGNOSIS — I209 Angina pectoris, unspecified: Secondary | ICD-10-CM

## 2015-11-23 DIAGNOSIS — R0602 Shortness of breath: Secondary | ICD-10-CM | POA: Diagnosis not present

## 2015-11-23 MED ORDER — ATORVASTATIN CALCIUM 40 MG PO TABS: 40.0000 mg | ORAL_TABLET | ORAL | 3 refills | Status: DC

## 2015-11-23 MED ORDER — RAMIPRIL 10 MG PO CAPS: 10.0000 mg | ORAL_CAPSULE | Freq: Every day | ORAL | 3 refills | Status: DC

## 2015-11-23 NOTE — Progress Notes (Signed)
In Cardiology Office Note  Date:  11/23/2015   ID:  [name] [name], DOB [birth year], MRN [id number]  PCP:  Viviana Simpler, MD   Chief Complaint  Patient presents with  . other    6 month follow up. Meds reviewed by the pt. verbally. Pt. c/o shortness of breath.     HPI:  82 year old gentleman with a history of coronary artery disease, bypass surgery in November 2006, history of diabetes, obesity, hyperlipidemia, hypertension, erectile dysfunction who presents for routine followup of his coronary artery disease.  In follow-up today, he continues to have shortness of breath on exertion Nuclear Stress test was ordered to rule out ischemia, images were reviewed by myself, this did not show any large regions of ischemia Currently not participating in an exercise program Lab work reviewed with him Total chol 111, LDL 61  Weight down 6 pounds from his previous clinic visit Has cut down on his soda Sugars up, had cortisone 3 to 4 weeks ago   Having Left stomach pain, nerve pain. 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 Sees neurology at [facility]  Also having chronic back pain  He did have workup for this pain including CT scan which was reportedly benign (did show gallstones)  Other past medical history reviewed On his last clinic visit, he reported worsening shortness of breath over the past 6 months  Previous echocardiogram in 2015 which was essentially normal  he denies any leg edema  CT scan of his abdomen reviewed showing mild diffuse aortic atherosclerosis Review of lab work showing elevated glucose levels but does report hemoglobin A1c 6.9  Reports he saw specialist for his back in [place], had MRI showing arthritis no spinal stenosis He is considering starting meloxicam Was previously on Celebrex. This is more expensive  Previously took a trip [place] with long car trip for 10 hours x2 days each way. He had significant lower extremity edema. He took  additional doses of Lasix with improvement of his swelling. He reports having chronic swelling of the left lower extremity, swelling of the right lower extremity has resolved. Doppler of the lower extremity on the left showed no DVT  Periodically has subxiphoid pain that is sharp, unclear if it is associated with food. No longer taking a statin as this caused leg cramps He continues to bike 3-6 miles per day. Unable to walk long distances secondary to chronic knee and hip pain  Hemoglobin A1c 6.7.   participating in a study at [facility] where they manage his insulin . 6 Total cholesterol 112, LDL 61 with no cholesterol medication  catheterization for chest pain 06/05/2013 that showed patent grafts with LIMA to the LAD, vein graft to the diagonal, vein graft to the OM 3, vein graft to the distal RCA. Normal filling pressures and normal LV function.  Echocardiogram April 2009 shows normal systolic function with mild LVH, diastolic relaxation abnormality, mildly enlarged left atrium, mild aortic insufficiency. Cardiac CT scan in March 2009 showed severe coronary artery disease with patent grafts x4, saphenous vein graft to the PDA, OM, diagonal and a LIMA graft to the LAD.   PMH:   has a past medical history of Cataract; Coronary artery disease; Diastolic dysfunction; ED (erectile dysfunction); GERD (gastroesophageal reflux disease); Hyperlipidemia; Hypertension; Hypertrophy of prostate without urinary obstruction and other lower urinary tract symptoms (LUTS); IDDM (insulin dependent diabetes mellitus) ([place]) (2000); Osteoarthritis; Perirectal fistula; and Tubular adenoma of colon (07/2012).  PSH:    Past Surgical History:  Procedure  Laterality Date  . CARDIAC CATHETERIZATION  06/05/2013   [facility].   Marland Kitchen CARDIAC CATHETERIZATION N/A 09/24/2015   Procedure: Right Heart Cath and Coronary/Graft Angiography;  Surgeon: Minna Merritts, MD;  Location: [facility];  Service: Cardiovascular;   Laterality: N/A;  . CATARACT EXTRACTION  sept 2013   right  . CHOLECYSTECTOMY  05/15/2011   Procedure: LAPAROSCOPIC CHOLECYSTECTOMY;  Surgeon: Rolm Bookbinder, MD;  Location: [facility];  Service: General;  Laterality: N/A;  . COLONOSCOPY W/ POLYPECTOMY    . CORONARY ARTERY BYPASS GRAFT  2007   x 4  . FOOT SURGERY  2012   right foot  . JOINT REPLACEMENT  8/10   Right THR--[name]  . LEFT AND RIGHT HEART CATHETERIZATION WITH CORONARY/GRAFT ANGIOGRAM N/A 06/05/2013   Procedure: LEFT AND RIGHT HEART CATHETERIZATION WITH Beatrix Fetters;  Surgeon: Peter M Martinique, MD;  Location: [facility] CATH LAB;  Service: Cardiovascular;  Laterality: N/A;  . LUMBAR LAMINECTOMY  1989  . Prostate photovaporization  5/16   Dr Budd Palmer    Current Outpatient Prescriptions  Medication Sig Dispense Refill  . acetaminophen (TYLENOL) 650 MG CR tablet Take 650 mg by mouth every 8 (eight) hours as needed for pain. For hand arthritis    . aspirin 81 MG tablet Take 162 mg by mouth daily.     Marland Kitchen atorvastatin (LIPITOR) 40 MG tablet Take 20 mg by mouth every other day.    . BD INSULIN SYRINGE ULTRAFINE 31G X 15/64" 0.3 ML MISC USE AS DIRECTED 100 each 3  . Biotin 1000 MCG tablet Take 1,000 mcg by mouth daily.    . carvedilol (COREG) 12.5 MG tablet TAKE ONE TABLET TWICE A DAY WITH MEALS. 180 tablet 3  . finasteride (PROSCAR) 5 MG tablet Take 1 tablet (5 mg total) by mouth daily. 90 tablet 1  . furosemide (LASIX) 20 MG tablet Take 1 tablet (20 mg total) by mouth 2 (two) times daily as needed. (Patient taking differently: Take 20 mg by mouth daily. ) 60 tablet 6  . glimepiride (AMARYL) 1 MG tablet TAKE 1/2 TABLET AM DAILY.    Marland Kitchen LANTUS 100 UNIT/ML injection Inject 18 Units into the skin 2 (two) times daily.     . meloxicam (MOBIC) 15 MG tablet Take 15 mg by mouth daily.     Marland Kitchen omeprazole (PRILOSEC) 20 MG capsule TAKE ONE CAPSULE TWICE A DAY 60 capsule 11  . potassium chloride (K-DUR) 10 MEQ tablet Take 1 tablet (10 mEq  total) by mouth daily. 30 tablet 6  . ramipril (ALTACE) 10 MG capsule TAKE ONE CAPSULE DAILY 30 capsule 6  . traMADol (ULTRAM) 50 MG tablet Take 50 mg by mouth as needed.      No current facility-administered medications for this visit.      Allergies:   Review of patient's allergies indicates no known allergies.   Social History:  The patient  reports that he has never smoked. He has never used smokeless tobacco. He reports that he drinks alcohol. He reports that he does not use drugs.   Family History:   family history includes Heart disease in his father; Lung cancer in his mother.    Review of Systems: Review of Systems  Constitutional: Negative.   Respiratory: Positive for shortness of breath.   Cardiovascular: Negative.   Gastrointestinal: Negative.   Musculoskeletal: Negative.   Neurological: Negative.        Left flank nerve pain  Psychiatric/Behavioral: Negative.   All other systems reviewed  and are negative.    PHYSICAL EXAM: VS:  BP (!) 150/68 (BP Location: Left Arm, Patient Position: Sitting, Cuff Size: Normal)   Pulse 74   Ht 6\' 1"  (1.854 m)   Wt 217 lb 8 oz (98.7 kg)   BMI 28.70 kg/m  , BMI Body mass index is 28.7 kg/m. GEN: Well nourished, well developed, in no acute distress, obese  HEENT: normal  Neck: no JVD, carotid bruits, or masses Cardiac: RRR; no murmurs, rubs, or gallops,no edema  Respiratory:  clear to auscultation bilaterally, normal work of breathing GI: soft, nontender, nondistended, + BS MS: no deformity or atrophy  Skin: warm and dry, no rash Neuro:  Strength and sensation are intact Psych: euthymic mood, full affect    Recent Labs: 03/09/2015: ALT 18 09/21/2015: BUN 30; Creatinine, Ser 1.31; Hemoglobin 15.3; Platelets 114; Potassium 4.7; Sodium 137    Lipid Panel Lab Results  Component Value Date   CHOL 111 02/06/2015   HDL 31.90 (L) 02/06/2015   LDLCALC 61 02/06/2015   TRIG 92.0 02/06/2015      Wt Readings from Last 3  Encounters:  11/23/15 217 lb 8 oz (98.7 kg)  10/05/15 216 lb 11.2 oz (98.3 kg)  09/24/15 223 lb (101.2 kg)       ASSESSMENT AND PLAN:  Angina pectoris (HCC) - Plan: EKG 12-Lead Chronic stable shortness of breath, secondary to stable angina, obesity, deconditioning Again discussed various treatment options with him. He is interested in cardiac/pulmonary rehabilitation if available  Coronary artery disease involving native coronary artery of native heart with stable angina - Plan: EKG 12-Lead No further testing at this time, no ischemia on stress testing Stressed importance of dietary changes, weight loss We'll see if he can qualify for rehabilitation  Chronic diastolic CHF (congestive heart failure) ([place]) - Plan: EKG 12-Lead Recommended he take Lasix as needed for ankle swelling, weight gain  SOB (shortness of breath) - Plan: EKG 12-Lead Chronic shortness of breath, feels it is above what his baseline shortness of breath should be. No ischemia on stress testing. Likely stable angina symptoms. We will recommend pulmonary rehabilitation Or cardiac rehabilitation if he can qualify   Total encounter time more than 25 minutes  Greater than 50% was spent in counseling and coordination of care with the patient   Disposition:   F/U  6 months  Orders Placed This Encounter  Procedures  . EKG 12-Lead     Signed, Esmond Plants, M.D., Ph.D. 11/23/2015  [place], [place]

## 2015-11-23 NOTE — Patient Instructions (Addendum)
Medication Instructions:   No medication changes made  Watch the breads  Labwork:  No new labs needed  Testing/Procedures:  No further testing at this time   Follow-Up: It was a pleasure seeing you in the office today. Please call us if you have new issues that need to be addressed before your next appt.  [phone]  Your physician wants you to follow-up in: 6 months.  You will receive a reminder letter in the mail two months in advance. If you don't receive a letter, please call our office to schedule the follow-up appointment.  If you need a refill on your cardiac medications before your next appointment, please call your pharmacy.

## 2015-11-24 ENCOUNTER — Other Ambulatory Visit: Payer: Self-pay | Admitting: *Deleted

## 2015-11-25 ENCOUNTER — Ambulatory Visit: Payer: Medicare Other

## 2015-11-25 DIAGNOSIS — E118 Type 2 diabetes mellitus with unspecified complications: Secondary | ICD-10-CM | POA: Diagnosis not present

## 2015-11-25 DIAGNOSIS — E119 Type 2 diabetes mellitus without complications: Secondary | ICD-10-CM | POA: Diagnosis not present

## 2015-11-25 DIAGNOSIS — Z794 Long term (current) use of insulin: Secondary | ICD-10-CM | POA: Diagnosis not present

## 2015-11-26 ENCOUNTER — Ambulatory Visit: Payer: Medicare Other

## 2015-11-30 ENCOUNTER — Ambulatory Visit: Payer: Medicare Other

## 2015-12-01 ENCOUNTER — Encounter: Payer: Self-pay | Admitting: *Deleted

## 2015-12-01 DIAGNOSIS — M47816 Spondylosis without myelopathy or radiculopathy, lumbar region: Secondary | ICD-10-CM | POA: Diagnosis not present

## 2015-12-01 DIAGNOSIS — G8929 Other chronic pain: Secondary | ICD-10-CM | POA: Diagnosis not present

## 2015-12-01 DIAGNOSIS — M25561 Pain in right knee: Secondary | ICD-10-CM | POA: Diagnosis not present

## 2015-12-01 DIAGNOSIS — M25562 Pain in left knee: Secondary | ICD-10-CM | POA: Diagnosis not present

## 2015-12-01 DIAGNOSIS — G894 Chronic pain syndrome: Secondary | ICD-10-CM | POA: Diagnosis not present

## 2015-12-01 NOTE — Progress Notes (Signed)
Cardiac Individual Treatment Plan  Patient Details  Name: [name] [name] MRN: [id number] Date of Birth: October 18, 1933 Referring Provider:   Flowsheet Row Cardiac Rehab from 10/05/2015 in [facility] Cardiac and Pulmonary Rehab  Referring Provider  Ida Rogue MD      Initial Encounter Date:  Flowsheet Row Cardiac Rehab from 10/05/2015 in [facility] Cardiac and Pulmonary Rehab  Date  10/05/15  Referring Provider  Ida Rogue MD      Visit Diagnosis: No diagnosis found.  Patient's Home Medications on Admission:  Current Outpatient Prescriptions:  .  acetaminophen (TYLENOL) 650 MG CR tablet, Take 650 mg by mouth every 8 (eight) hours as needed for pain. For hand arthritis, Disp: , Rfl:  .  aspirin 81 MG tablet, Take 162 mg by mouth daily. , Disp: , Rfl:  .  atorvastatin (LIPITOR) 40 MG tablet, Take 1 tablet (40 mg total) by mouth every other day., Disp: 90 tablet, Rfl: 3 .  BD INSULIN SYRINGE ULTRAFINE 31G X 15/64" 0.3 ML MISC, USE AS DIRECTED, Disp: 100 each, Rfl: 3 .  Biotin 1000 MCG tablet, Take 1,000 mcg by mouth daily., Disp: , Rfl:  .  carvedilol (COREG) 12.5 MG tablet, TAKE ONE TABLET TWICE A DAY WITH MEALS., Disp: 180 tablet, Rfl: 3 .  finasteride (PROSCAR) 5 MG tablet, Take 1 tablet (5 mg total) by mouth daily., Disp: 90 tablet, Rfl: 1 .  furosemide (LASIX) 20 MG tablet, Take 1 tablet (20 mg total) by mouth 2 (two) times daily as needed. (Patient taking differently: Take 20 mg by mouth daily. ), Disp: 60 tablet, Rfl: 6 .  glimepiride (AMARYL) 1 MG tablet, TAKE 1/2 TABLET AM DAILY., Disp: , Rfl:  .  LANTUS 100 UNIT/ML injection, Inject 18 Units into the skin 2 (two) times daily. , Disp: , Rfl:  .  meloxicam (MOBIC) 15 MG tablet, Take 15 mg by mouth daily. , Disp: , Rfl:  .  omeprazole (PRILOSEC) 20 MG capsule, TAKE ONE CAPSULE TWICE A DAY, Disp: 60 capsule, Rfl: 11 .  potassium chloride (K-DUR) 10 MEQ tablet, Take 1 tablet (10 mEq total) by mouth daily., Disp: 30 tablet, Rfl: 6 .   ramipril (ALTACE) 10 MG capsule, Take 1 capsule (10 mg total) by mouth daily., Disp: 90 capsule, Rfl: 3 .  traMADol (ULTRAM) 50 MG tablet, Take 50 mg by mouth as needed. , Disp: , Rfl:   Past Medical History: Past Medical History:  Diagnosis Date  . Cataract   . Coronary artery disease    a. 4v CABG 01/2005; b. cath 06/05/13 showed patent grafts: LIMA-LAD, VG-D, VG-OM3, VG-dRCA, nl filling pressures, nl LV fxn  . Diastolic dysfunction    a. echo 0000000: nl systolic fxn, mild LVH, diastolic relaxation abnormality, mildly enlarged LA, mild Ao insufficiency  . ED (erectile dysfunction)   . GERD (gastroesophageal reflux disease)   . Hyperlipidemia   . Hypertension   . Hypertrophy of prostate without urinary obstruction and other lower urinary tract symptoms (LUTS)   . 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 Osteoarthritis   . Perirectal fistula   . Tubular adenoma of colon 07/2012    Tobacco Use: History  Smoking Status  . Never Smoker  Smokeless Tobacco  . Never Used    Labs: Recent Review Flowsheet Data    Labs for ITP Cardiac and Pulmonary Rehab Latest Ref Rng & Units 07/30/2013 11/05/2013 07/04/2014 11/07/2014 02/06/2015   Cholestrol 0 - 200 mg/dL 112 - 172 - 111  LDLCALC 0 - 99 mg/dL 61 - 110(H) - 61   HDL >39.00 mg/dL 39(L) - 39(L) - 31.90(L)   Trlycerides 0.0 - 149.0 mg/dL 59 - 115 - 92.0   Hemoglobin A1c 4.0 - 6.0 % - 6.5(A) - 7.0(A) -   PHART 7.350 - 7.450 - - - - -   PCO2ART 35.0 - 45.0 mmHg - - - - -   HCO3 20.0 - 24.0 mEq/L - - - - -   TCO2 0 - 100 mmol/L - - - - -   O2SAT % - - - - -       Exercise Target Goals:    Exercise Program Goal: Individual exercise prescription set with THRR, safety & activity barriers. Participant demonstrates ability to understand and report RPE using BORG scale, to self-measure pulse accurately, and to acknowledge the importance of the exercise prescription.  Exercise Prescription Goal: Starting with aerobic activity  30 plus minutes a day, 3 days per week for initial exercise prescription. Provide home exercise prescription and guidelines that participant acknowledges understanding prior to discharge.  Activity Barriers & Risk Stratification:     Activity Barriers & Cardiac Risk Stratification - 10/05/15 1526      Activity Barriers & Cardiac Risk Stratification   Activity Barriers --  Has had pain arounf edge of rib cage and towards his back since Jan this year.  Been to several doctors without a diagnosis. Last 2 weeks had PT with stretching and accupuncture with some relief. Has Pain clinic appt 8/1 in [facility]      6 Minute Walk:     [place] Name 10/05/15 1501         6 Minute Walk   Phase Initial     Distance 1165 feet     Walk Time 6 minutes     # of Rest Breaks 0     MPH 2.21     METS 2.08     RPE 12     VO2 Peak 7.28     Symptoms No     Resting HR 64 bpm     Resting BP 126/70     Max Ex. HR 87 bpm     Max Ex. BP 146/70     2 Minute Post BP 134/70        Initial Exercise Prescription:     Initial Exercise Prescription - 10/05/15 1500      Date of Initial Exercise RX and Referring Provider   Date 10/05/15   Referring Provider Ida Rogue MD     Treadmill   MPH 1.4   Grade 0   Minutes 15   METs 2.07     Recumbant Elliptical   Level 1   RPM 50   Minutes 15   METs 2     T5 Nustep   Level 1   Minutes 15   METs 2     Prescription Details   Frequency (times per week) 3     Intensity   THRR 40-80% of Max Heartrate 95-126   Ratings of Perceived Exertion 11-15   Perceived Dyspnea 0-4     Progression   Progression Continue to progress workloads to maintain intensity without signs/symptoms of physical distress.     Resistance Training   Training Prescription Yes   Weight 2 lbs   Reps 10-15      Perform Capillary Blood Glucose checks as needed.  Exercise Prescription Changes:  Exercise Prescription Changes    Row Name  10/05/15 1300             Response to Exercise   Blood Pressure (Admit) 126/70       Blood Pressure (Exercise) 146/70       Blood Pressure (Exit) 134/70       Heart Rate (Admit) 62 bpm       Heart Rate (Exercise) 98 bpm       Heart Rate (Exit) 64 bpm       Rating of Perceived Exertion (Exercise) 12          Exercise Comments:   Discharge Exercise Prescription (Final Exercise Prescription Changes):     Exercise Prescription Changes - 10/05/15 1300      Response to Exercise   Blood Pressure (Admit) 126/70   Blood Pressure (Exercise) 146/70   Blood Pressure (Exit) 134/70   Heart Rate (Admit) 62 bpm   Heart Rate (Exercise) 98 bpm   Heart Rate (Exit) 64 bpm   Rating of Perceived Exertion (Exercise) 12      Nutrition:  Target Goals: Understanding of nutrition guidelines, daily intake of sodium 1500mg , cholesterol 200mg , calories 30% from fat and 7% or less from saturated fats, daily to have 5 or more servings of fruits and vegetables.  Biometrics:     Pre Biometrics - 10/05/15 1512      Pre Biometrics   Height 5' 11.8" (1.824 m)   Weight 216 lb 11.2 oz (98.3 kg)   Waist Circumference 45.25 inches   Hip Circumference 43 inches   Waist to Hip Ratio 1.05 %   BMI (Calculated) 29.6   Single Leg Stand 9.2 seconds       Nutrition Therapy Plan and Nutrition Goals:     Nutrition Therapy & Goals - 10/05/15 1521      Intervention Plan   Intervention Prescribe, educate and counsel regarding individualized specific dietary modifications aiming towards targeted core components such as weight, hypertension, lipid management, diabetes, heart failure and other comorbidities.   Expected Outcomes Short Term Goal: Understand basic principles of dietary content, such as calories, fat, sodium, cholesterol and nutrients.;Short Term Goal: A plan has been developed with personal nutrition goals set during dietitian appointment.;Long Term Goal: Adherence to prescribed nutrition plan.       Nutrition Discharge: Rate Your Plate Scores:     Nutrition Assessments - 10/23/15 1428      Rate Your Plate Scores   Pre Score 58   Pre Score % 64 %      Nutrition Goals Re-Evaluation:   Psychosocial: Target Goals: Acknowledge presence or absence of depression, maximize coping skills, provide positive support system. Participant is able to verbalize types and ability to use techniques and skills needed for reducing stress and depression.  Initial Review & Psychosocial Screening:     Initial Psych Review & Screening - 10/05/15 [place]? Yes     Barriers   Psychosocial barriers to participate in program The patient should benefit from training in stress management and relaxation.;There are no identifiable barriers or psychosocial needs.     Screening Interventions   Interventions Encouraged to exercise      Quality of Life Scores:     Quality of Life - 10/05/15 1521      Quality of Life Scores   Health/Function Pre 24.86 %   Socioeconomic Pre 30 %   Psych/Spiritual Pre 30 %  Family Pre 25.2 %   GLOBAL Pre 27.18 %      PHQ-9: Recent Review Flowsheet Data    Depression screen [facility] 2/9 10/05/2015 02/06/2015 02/05/2014   Decreased Interest 0 0 0   Down, Depressed, Hopeless 0 0 0   PHQ - 2 Score 0 0 0   Altered sleeping 1 - -   Tired, decreased energy 2 - -   Change in appetite 2 - -   Feeling bad or failure about yourself  0 - -   Trouble concentrating 0 - -   Moving slowly or fidgety/restless 0 - -   Suicidal thoughts 0 - -   PHQ-9 Score 5 - -   Difficult doing work/chores Not difficult at all - -      Psychosocial Evaluation and Intervention:   Psychosocial Re-Evaluation:   Vocational Rehabilitation: Provide vocational rehab assistance to qualifying candidates.   Vocational Rehab Evaluation & Intervention:     Vocational Rehab - 10/05/15 1528      Initial Vocational Rehab Evaluation & Intervention    Assessment shows need for Vocational Rehabilitation No      Education: Education Goals: Education classes will be provided on a weekly basis, covering required topics. Participant will state understanding/return demonstration of topics presented.  Learning Barriers/Preferences:     Learning Barriers/Preferences - 10/05/15 1527      Learning Barriers/Preferences   Learning Barriers None   Learning Preferences None      Education Topics: General Nutrition Guidelines/Fats and Fiber: -Group instruction provided by verbal, written material, models and posters to present the general guidelines for heart healthy nutrition. Gives an explanation and review of dietary fats and fiber.   Controlling Sodium/Reading Food Labels: -Group verbal and written material supporting the discussion of sodium use in heart healthy nutrition. Review and explanation with models, verbal and written materials for utilization of the food label.   Exercise Physiology & Risk Factors: - Group verbal and written instruction with models to review the exercise physiology of the cardiovascular system and associated critical values. Details cardiovascular disease risk factors and the goals associated with each risk factor.   Aerobic Exercise & Resistance Training: - Gives group verbal and written discussion on the health impact of inactivity. On the components of aerobic and resistive training programs and the benefits of this training and how to safely progress through these programs.   Flexibility, Balance, General Exercise Guidelines: - Provides group verbal and written instruction on the benefits of flexibility and balance training programs. Provides general exercise guidelines with specific guidelines to those with heart or lung disease. Demonstration and skill practice provided.   Stress Management: - Provides group verbal and written instruction about the health risks of elevated stress, cause of high  stress, and healthy ways to reduce stress.   Depression: - Provides group verbal and written instruction on the correlation between heart/lung disease and depressed mood, treatment options, and the stigmas associated with seeking treatment.   Anatomy & Physiology of the Heart: - Group verbal and written instruction and models provide basic cardiac anatomy and physiology, with the coronary electrical and arterial systems. Review of: AMI, Angina, Valve disease, Heart Failure, Cardiac Arrhythmia, Pacemakers, and the ICD.   Cardiac Procedures: - Group verbal and written instruction and models to describe the testing methods done to diagnose heart disease. Reviews the outcomes of the test results. Describes the treatment choices: Medical Management, Angioplasty, or Coronary Bypass Surgery.   Cardiac Medications: - Group verbal and written  instruction to review commonly prescribed medications for heart disease. Reviews the medication, class of the drug, and side effects. Includes the steps to properly store meds and maintain the prescription regimen.   Go Sex-Intimacy & Heart Disease, Get SMART - Goal Setting: - Group verbal and written instruction through game format to discuss heart disease and the return to sexual intimacy. Provides group verbal and written material to discuss and apply goal setting through the application of the S.M.A.R.T. Method.   Other Matters of the Heart: - Provides group verbal, written materials and models to describe Heart Failure, Angina, Valve Disease, and Diabetes in the realm of heart disease. Includes description of the disease process and treatment options available to the cardiac patient.   Exercise & Equipment Safety: - Individual verbal instruction and demonstration of equipment use and safety with use of the equipment. Flowsheet Row Cardiac Rehab from 10/05/2015 in [facility] Cardiac and Pulmonary Rehab  Date  10/05/15  Educator  SB  Instruction Review Code   2- meets goals/outcomes      Infection Prevention: - Provides verbal and written material to individual with discussion of infection control including proper hand washing and proper equipment cleaning during exercise session. Flowsheet Row Cardiac Rehab from 10/05/2015 in [facility] Cardiac and Pulmonary Rehab  Date  10/05/15  Educator  SB  Instruction Review Code  2- meets goals/outcomes      Falls Prevention: - Provides verbal and written material to individual with discussion of falls prevention and safety.   Diabetes: - Individual verbal and written instruction to review signs/symptoms of diabetes, desired ranges of glucose level fasting, after meals and with exercise. Advice that pre and post exercise glucose checks will be done for 3 sessions at entry of program. [place] from 10/05/2015 in [facility] Cardiac and Pulmonary Rehab  Date  10/05/15  Educator  SB  Instruction Review Code  2- meets goals/outcomes       Knowledge Questionnaire Score:     Knowledge Questionnaire Score - 10/05/15 1527      Knowledge Questionnaire Score   Pre Score 22/28      Core Components/Risk Factors/Patient Goals at Admission:     Personal Goals and Risk Factors at Admission - 10/05/15 1509      Core Components/Risk Factors/Patient Goals on Admission    Weight Management Yes;Weight Loss;Obesity   Intervention Weight Management: Develop a combined nutrition and exercise program designed to reach desired caloric intake, while maintaining appropriate intake of nutrient and fiber, sodium and fats, and appropriate energy expenditure required for the weight goal.;Weight Management: Provide education and appropriate resources to help participant work on and attain dietary goals.;Weight Management/Obesity: Establish reasonable short term and long term weight goals.;Obesity: Provide education and appropriate resources to help participant work on and attain dietary goals.   Admit Weight 216  lb 11.2 oz (98.3 kg)   Goal Weight: Short Term 210 lb (95.3 kg)   Goal Weight: Long Term 200 lb (90.7 kg)   Expected Outcomes Long Term: Adherence to nutrition and physical activity/exercise program aimed toward attainment of established weight goal;Short Term: Continue to assess and modify interventions until short term weight is achieved;Weight Loss: Understanding of general recommendations for a balanced deficit meal plan, which promotes 1-2 lb weight loss per week and includes a negative energy balance of [phone] kcal/d;Understanding recommendations for meals to include 15-35% energy as protein, 25-35% energy from fat, 35-60% energy from carbohydrates, less than 200mg  of dietary cholesterol, 20-35 gm of total fiber  daily;Understanding of distribution of calorie intake throughout the day with the consumption of 4-5 meals/snacks   Increase Strength and Stamina Yes   Intervention Provide advice, education, support and counseling about physical activity/exercise needs.;Develop an individualized exercise prescription for aerobic and resistive training based on initial evaluation findings, risk stratification, comorbidities and participant's personal goals.   Expected Outcomes Achievement of increased cardiorespiratory fitness and enhanced flexibility, muscular endurance and strength shown through measurements of functional capacity and personal statement of participant.   Diabetes Yes   Intervention Provide education about signs/symptoms and action to take for hypo/hyperglycemia.;Provide education about proper nutrition, including hydration, and aerobic/resistive exercise prescription along with prescribed medications to achieve blood glucose in normal ranges: Fasting glucose 65-99 mg/dL   Expected Outcomes Short Term: Participant verbalizes understanding of the signs/symptoms and immediate care of hyper/hypoglycemia, proper foot care and importance of medication, aerobic/resistive exercise and nutrition  plan for blood glucose control.;Long Term: Attainment of HbA1C < 7%.   Heart Failure Yes   Intervention Provide a combined exercise and nutrition program that is supplemented with education, support and counseling about heart failure. Directed toward relieving symptoms such as shortness of breath, decreased exercise tolerance, and extremity edema.   Expected Outcomes Improve functional capacity of life;Short term: Attendance in program 2-3 days a week with increased exercise capacity. Reported lower sodium intake. Reported increased fruit and vegetable intake. Reports medication compliance.;Short term: Daily weights obtained and reported for increase. Utilizing diuretic protocols set by physician.;Long term: Adoption of self-care skills and reduction of barriers for early signs and symptoms recognition and intervention leading to self-care maintenance.   Hypertension Yes   Intervention Provide education on lifestyle modifcations including regular physical activity/exercise, weight management, moderate sodium restriction and increased consumption of fresh fruit, vegetables, and low fat dairy, alcohol moderation, and smoking cessation.;Monitor prescription use compliance.   Expected Outcomes Short Term: Continued assessment and intervention until BP is < 140/52mm HG in hypertensive participants. < 130/28mm HG in hypertensive participants with diabetes, heart failure or chronic kidney disease.;Long Term: Maintenance of blood pressure at goal levels.   Lipids Yes   Intervention Provide education and support for participant on nutrition & aerobic/resistive exercise along with prescribed medications to achieve LDL 70mg , HDL >40mg .   Expected Outcomes Short Term: Participant states understanding of desired cholesterol values and is compliant with medications prescribed. Participant is following exercise prescription and nutrition guidelines.;Long Term: Cholesterol controlled with medications as prescribed, with  individualized exercise RX and with personalized nutrition plan. Value goals: LDL < 70mg , HDL > 40 mg.   Personal Goal Other Yes   Personal Goal Breathe better during exercise   Intervention Provide education and exercise prescription and include tips for pursed lip breathing   Expected Outcomes Able to feel less short of breath with exercise.      Core Components/Risk Factors/Patient Goals Review:    Core Components/Risk Factors/Patient Goals at Discharge (Final Review):    ITP Comments:     ITP Comments    Row Name 10/05/15 1519 12/01/15 1537         ITP Comments Initial visit completed today ITP created.  Continue with ITP ITP ready for initial signature.  Documentation of diagnosis found in Office Note 11/23/2015 [facility]         Comments:

## 2015-12-01 NOTE — Patient Instructions (Signed)
Patient Instructions  Patient Details  Name: EMMANUELLE CONNALLY MRN: [id number] Date of Birth: 1934/02/18 Referring Provider:  Minna Merritts, MD  Below are the personal goals you chose as well as exercise and nutrition goals. Our goal is to help you keep on track towards obtaining and maintaining your goals. We will be discussing your progress on these goals with you throughout the program.  Initial Exercise Prescription:     Initial Exercise Prescription - 10/05/15 1500      Date of Initial Exercise RX and Referring Provider   Date 10/05/15   Referring Provider Ida Rogue MD     Treadmill   MPH 1.4   Grade 0   Minutes 15   METs 2.07     Recumbant Elliptical   Level 1   RPM 50   Minutes 15   METs 2     T5 Nustep   Level 1   Minutes 15   METs 2     Prescription Details   Frequency (times per week) 3     Intensity   THRR 40-80% of Max Heartrate 95-126   Ratings of Perceived Exertion 11-15   Perceived Dyspnea 0-4     Progression   Progression Continue to progress workloads to maintain intensity without signs/symptoms of physical distress.     Resistance Training   Training Prescription Yes   Weight 2 lbs   Reps 10-15      Exercise Goals: Frequency: Be able to perform aerobic exercise three times per week working toward 3-5 days per week.  Intensity: Work with a perceived exertion of 11 (fairly light) - 15 (hard) as tolerated. Follow your new exercise prescription and watch for changes in prescription as you progress with the program. Changes will be reviewed with you when they are made.  Duration: You should be able to do 30 minutes of continuous aerobic exercise in addition to a 5 minute warm-up and a 5 minute cool-down routine.  Nutrition Goals: Your personal nutrition goals will be established when you do your nutrition analysis with the dietician.  The following are nutrition guidelines to follow: Cholesterol < 200mg /day Sodium <  1500mg /day Fiber: Men over 50 yrs - 30 grams per day  Personal Goals:     Personal Goals and Risk Factors at Admission - 10/05/15 1509      Core Components/Risk Factors/Patient Goals on Admission    Weight Management Yes;Weight Loss;Obesity   Intervention Weight Management: Develop a combined nutrition and exercise program designed to reach desired caloric intake, while maintaining appropriate intake of nutrient and fiber, sodium and fats, and appropriate energy expenditure required for the weight goal.;Weight Management: Provide education and appropriate resources to help participant work on and attain dietary goals.;Weight Management/Obesity: Establish reasonable short term and long term weight goals.;Obesity: Provide education and appropriate resources to help participant work on and attain dietary goals.   Admit Weight 216 lb 11.2 oz (98.3 kg)   Goal Weight: Short Term 210 lb (95.3 kg)   Goal Weight: Long Term 200 lb (90.7 kg)   Expected Outcomes Long Term: Adherence to nutrition and physical activity/exercise program aimed toward attainment of established weight goal;Short Term: Continue to assess and modify interventions until short term weight is achieved;Weight Loss: Understanding of general recommendations for a balanced deficit meal plan, which promotes 1-2 lb weight loss per week and includes a negative energy balance of [phone] kcal/d;Understanding recommendations for meals to include 15-35% energy as protein, 25-35% energy from fat, 35-60% energy  from carbohydrates, less than 200mg  of dietary cholesterol, 20-35 gm of total fiber daily;Understanding of distribution of calorie intake throughout the day with the consumption of 4-5 meals/snacks   Increase Strength and Stamina Yes   Intervention Provide advice, education, support and counseling about physical activity/exercise needs.;Develop an individualized exercise prescription for aerobic and resistive training based on initial  evaluation findings, risk stratification, comorbidities and participant's personal goals.   Expected Outcomes Achievement of increased cardiorespiratory fitness and enhanced flexibility, muscular endurance and strength shown through measurements of functional capacity and personal statement of participant.   Diabetes Yes   Intervention Provide education about signs/symptoms and action to take for hypo/hyperglycemia.;Provide education about proper nutrition, including hydration, and aerobic/resistive exercise prescription along with prescribed medications to achieve blood glucose in normal ranges: Fasting glucose 65-99 mg/dL   Expected Outcomes Short Term: Participant verbalizes understanding of the signs/symptoms and immediate care of hyper/hypoglycemia, proper foot care and importance of medication, aerobic/resistive exercise and nutrition plan for blood glucose control.;Long Term: Attainment of HbA1C < 7%.   Heart Failure Yes   Intervention Provide a combined exercise and nutrition program that is supplemented with education, support and counseling about heart failure. Directed toward relieving symptoms such as shortness of breath, decreased exercise tolerance, and extremity edema.   Expected Outcomes Improve functional capacity of life;Short term: Attendance in program 2-3 days a week with increased exercise capacity. Reported lower sodium intake. Reported increased fruit and vegetable intake. Reports medication compliance.;Short term: Daily weights obtained and reported for increase. Utilizing diuretic protocols set by physician.;Long term: Adoption of self-care skills and reduction of barriers for early signs and symptoms recognition and intervention leading to self-care maintenance.   Hypertension Yes   Intervention Provide education on lifestyle modifcations including regular physical activity/exercise, weight management, moderate sodium restriction and increased consumption of fresh fruit,  vegetables, and low fat dairy, alcohol moderation, and smoking cessation.;Monitor prescription use compliance.   Expected Outcomes Short Term: Continued assessment and intervention until BP is < 140/69mm HG in hypertensive participants. < 130/13mm HG in hypertensive participants with diabetes, heart failure or chronic kidney disease.;Long Term: Maintenance of blood pressure at goal levels.   Lipids Yes   Intervention Provide education and support for participant on nutrition & aerobic/resistive exercise along with prescribed medications to achieve LDL 70mg , HDL >40mg .   Expected Outcomes Short Term: Participant states understanding of desired cholesterol values and is compliant with medications prescribed. Participant is following exercise prescription and nutrition guidelines.;Long Term: Cholesterol controlled with medications as prescribed, with individualized exercise RX and with personalized nutrition plan. Value goals: LDL < 70mg , HDL > 40 mg.   Personal Goal Other Yes   Personal Goal Breathe better during exercise   Intervention Provide education and exercise prescription and include tips for pursed lip breathing   Expected Outcomes Able to feel less short of breath with exercise.      Tobacco Use Initial Evaluation: History  Smoking Status  . Never Smoker  Smokeless Tobacco  . Never Used    Copy of goals given to participant.

## 2015-12-02 ENCOUNTER — Ambulatory Visit: Payer: Medicare Other

## 2015-12-03 ENCOUNTER — Ambulatory Visit: Payer: Medicare Other

## 2015-12-07 ENCOUNTER — Ambulatory Visit: Payer: Medicare Other

## 2015-12-07 DIAGNOSIS — N400 Enlarged prostate without lower urinary tract symptoms: Secondary | ICD-10-CM | POA: Diagnosis not present

## 2015-12-07 DIAGNOSIS — M47816 Spondylosis without myelopathy or radiculopathy, lumbar region: Secondary | ICD-10-CM | POA: Diagnosis not present

## 2015-12-09 ENCOUNTER — Ambulatory Visit: Payer: Medicare Other

## 2015-12-10 ENCOUNTER — Ambulatory Visit: Payer: Medicare Other

## 2015-12-14 ENCOUNTER — Encounter: Payer: Medicare Other | Attending: Cardiovascular Disease | Admitting: *Deleted

## 2015-12-14 ENCOUNTER — Ambulatory Visit: Payer: Medicare Other

## 2015-12-14 DIAGNOSIS — Z951 Presence of aortocoronary bypass graft: Secondary | ICD-10-CM | POA: Diagnosis not present

## 2015-12-14 DIAGNOSIS — I208 Other forms of angina pectoris: Secondary | ICD-10-CM

## 2015-12-14 DIAGNOSIS — I5032 Chronic diastolic (congestive) heart failure: Secondary | ICD-10-CM | POA: Diagnosis not present

## 2015-12-14 LAB — GLUCOSE, CAPILLARY
GLUCOSE-CAPILLARY: 147 mg/dL — AB (ref 65–99)
Glucose-Capillary: 141 mg/dL — ABNORMAL HIGH (ref 65–99)

## 2015-12-14 NOTE — Progress Notes (Signed)
Daily Session Note  Patient Details  Name: [name] [name] MRN: [id number] Date of Birth: [birth year] Referring Provider:   Flowsheet Row Cardiac Rehab from 10/05/2015 in [facility]  Referring Provider  [name] Rogue MD      Encounter Date: 12/14/2015  Check In:     Session Check In - 12/14/15 [place]      Check-In   Staff Present [name] Lark, RN, BSN, CCRP;[name] Enterkin, RN, [name] [name], BS, ACSM CEP, Exercise Physiologist   Supervising physician immediately available to respond to emergencies See telemetry face sheet for immediately available ER MD   Medication changes reported     No   Fall or balance concerns reported    No   Warm-up and Cool-down Performed on first and last piece of equipment   Resistance Training Performed Yes   VAD Patient? No     Pain Assessment   Currently in Pain? No/denies         Goals Met:  Exercise tolerated well Personal goals reviewed No report of cardiac concerns or symptoms Strength training completed today  Goals Unmet:  Not Applicable  Comments: First full day of exercise! [name] [name] was oriented to gym and equipment including functions, settings, policies, and procedures. [name] [name]'s individual exercise prescription and treatment plan were reviewed.  All starting workloads were established based on the results of the 6 minute walk test done at initial orientation visit.  The plan for exercise progression was also introduced and progression will be customized based on patient's performance and goals.    Dr. Emily [name] is Medical Director for [facility].

## 2015-12-15 ENCOUNTER — Telehealth: Payer: Self-pay | Admitting: Cardiovascular Disease

## 2015-12-15 NOTE — Telephone Encounter (Signed)
Pt c/o swelling: STAT is pt has developed SOB within 24 hours  1. How long have you been experiencing swelling? 10 day period   2. Where is the swelling located? Feet and ankles   3.  Are you currently taking a "fluid pill"? yes  4.  Are you currently SOB? No   5.  Have you traveled recently? No   Has gained about 7-8 pounds over 10 days  Was told to call us  Please advise.

## 2015-12-15 NOTE — Telephone Encounter (Signed)
Spoke w/ pt.  He reports that he does not add salt to his foods at home, but eats out "1/3 of the time". He tries to drink a lot of water and does not have compression hose. Discussed diet w/ pt and advised him to limit his fluids and sodium intake.  He will keep his legs elevated when sitting, but declines compression hose, as they are not fashionable.  He is agreeable to wearing ACE bandage when at home. He takes lasix 20 mg every am.  Advised him to take and extra pill today - his instructions are for BID prn. He verbalizes understanding and will call back if his sx do not improve.

## 2015-12-16 ENCOUNTER — Ambulatory Visit: Payer: Medicare Other

## 2015-12-16 ENCOUNTER — Encounter: Payer: Medicare Other | Admitting: *Deleted

## 2015-12-16 ENCOUNTER — Encounter: Payer: Self-pay | Admitting: *Deleted

## 2015-12-16 DIAGNOSIS — I208 Other forms of angina pectoris: Secondary | ICD-10-CM

## 2015-12-16 DIAGNOSIS — I5032 Chronic diastolic (congestive) heart failure: Secondary | ICD-10-CM | POA: Diagnosis not present

## 2015-12-16 DIAGNOSIS — Z951 Presence of aortocoronary bypass graft: Secondary | ICD-10-CM | POA: Diagnosis not present

## 2015-12-16 LAB — GLUCOSE, CAPILLARY: GLUCOSE-CAPILLARY: 154 mg/dL — AB (ref 65–99)

## 2015-12-16 NOTE — Progress Notes (Signed)
Cardiac Individual Treatment Plan  Patient Details  Name: [name] [name] MRN: [id number] Date of Birth: 1933/11/26 Referring Provider:   Flowsheet Row Cardiac Rehab from 10/05/2015 in [facility] Cardiac and Pulmonary Rehab  Referring Provider  Ida Rogue MD      Initial Encounter Date:  Flowsheet Row Cardiac Rehab from 10/05/2015 in [facility] Cardiac and Pulmonary Rehab  Date  10/05/15  Referring Provider  Ida Rogue MD      Visit Diagnosis: Stable angina [facility])  Patient's Home Medications on Admission:  Current Outpatient Prescriptions:  .  acetaminophen (TYLENOL) 650 MG CR tablet, Take 650 mg by mouth every 8 (eight) hours as needed for pain. For hand arthritis, Disp: , Rfl:  .  aspirin 81 MG tablet, Take 162 mg by mouth daily. , Disp: , Rfl:  .  atorvastatin (LIPITOR) 40 MG tablet, Take 1 tablet (40 mg total) by mouth every other day., Disp: 90 tablet, Rfl: 3 .  BD INSULIN SYRINGE ULTRAFINE 31G X 15/64" 0.3 ML MISC, USE AS DIRECTED, Disp: 100 each, Rfl: 3 .  Biotin 1000 MCG tablet, Take 1,000 mcg by mouth daily., Disp: , Rfl:  .  carvedilol (COREG) 12.5 MG tablet, TAKE ONE TABLET TWICE A DAY WITH MEALS., Disp: 180 tablet, Rfl: 3 .  finasteride (PROSCAR) 5 MG tablet, Take 1 tablet (5 mg total) by mouth daily., Disp: 90 tablet, Rfl: 1 .  furosemide (LASIX) 20 MG tablet, Take 1 tablet (20 mg total) by mouth 2 (two) times daily as needed. (Patient taking differently: Take 20 mg by mouth daily. ), Disp: 60 tablet, Rfl: 6 .  glimepiride (AMARYL) 1 MG tablet, TAKE 1/2 TABLET AM DAILY., Disp: , Rfl:  .  LANTUS 100 UNIT/ML injection, Inject 18 Units into the skin 2 (two) times daily. , Disp: , Rfl:  .  meloxicam (MOBIC) 15 MG tablet, Take 15 mg by mouth daily. , Disp: , Rfl:  .  omeprazole (PRILOSEC) 20 MG capsule, TAKE ONE CAPSULE TWICE A DAY, Disp: 60 capsule, Rfl: 11 .  potassium chloride (K-DUR) 10 MEQ tablet, Take 1 tablet (10 mEq total) by mouth daily., Disp: 30 tablet, Rfl: 6 .   ramipril (ALTACE) 10 MG capsule, Take 1 capsule (10 mg total) by mouth daily., Disp: 90 capsule, Rfl: 3 .  traMADol (ULTRAM) 50 MG tablet, Take 50 mg by mouth as needed. , Disp: , Rfl:   Past Medical History: Past Medical History:  Diagnosis Date  . Cataract   . Coronary artery disease    a. 4v CABG 01/2005; b. cath 06/05/13 showed patent grafts: LIMA-LAD, VG-D, VG-OM3, VG-dRCA, nl filling pressures, nl LV fxn  . Diastolic dysfunction    a. echo 0000000: nl systolic fxn, mild LVH, diastolic relaxation abnormality, mildly enlarged LA, mild Ao insufficiency  . ED (erectile dysfunction)   . GERD (gastroesophageal reflux disease)   . Hyperlipidemia   . Hypertension   . Hypertrophy of prostate without urinary obstruction and other lower urinary tract symptoms (LUTS)   . 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 Osteoarthritis   . Perirectal fistula   . Tubular adenoma of colon 07/2012    Tobacco Use: History  Smoking Status  . Never Smoker  Smokeless Tobacco  . Never Used    Labs: Recent Review Flowsheet Data    Labs for ITP Cardiac and Pulmonary Rehab Latest Ref Rng & Units 07/30/2013 11/05/2013 07/04/2014 11/07/2014 02/06/2015   Cholestrol 0 - 200 mg/dL 112 - 172 - 111  LDLCALC 0 - 99 mg/dL 61 - 110(H) - 61   HDL >39.00 mg/dL 39(L) - 39(L) - 31.90(L)   Trlycerides 0.0 - 149.0 mg/dL 59 - 115 - 92.0   Hemoglobin A1c 4.0 - 6.0 % - 6.5(A) - 7.0(A) -   PHART 7.350 - 7.450 - - - - -   PCO2ART 35.0 - 45.0 mmHg - - - - -   HCO3 20.0 - 24.0 mEq/L - - - - -   TCO2 0 - 100 mmol/L - - - - -   O2SAT % - - - - -       Exercise Target Goals:    Exercise Program Goal: Individual exercise prescription set with THRR, safety & activity barriers. Participant demonstrates ability to understand and report RPE using BORG scale, to self-measure pulse accurately, and to acknowledge the importance of the exercise prescription.  Exercise Prescription Goal: Starting with aerobic activity  30 plus minutes a day, 3 days per week for initial exercise prescription. Provide home exercise prescription and guidelines that participant acknowledges understanding prior to discharge.  Activity Barriers & Risk Stratification:     Activity Barriers & Cardiac Risk Stratification - 10/05/15 1526      Activity Barriers & Cardiac Risk Stratification   Activity Barriers --  Has had pain arounf edge of rib cage and towards his back since Jan this year.  Been to several doctors without a diagnosis. Last 2 weeks had PT with stretching and accupuncture with some relief. Has Pain clinic appt 8/1 in [facility]      6 Minute Walk:     [place] Name 10/05/15 1501         6 Minute Walk   Phase Initial     Distance 1165 feet     Walk Time 6 minutes     # of Rest Breaks 0     MPH 2.21     METS 2.08     RPE 12     VO2 Peak 7.28     Symptoms No     Resting HR 64 bpm     Resting BP 126/70     Max Ex. HR 87 bpm     Max Ex. BP 146/70     2 Minute Post BP 134/70        Initial Exercise Prescription:     Initial Exercise Prescription - 10/05/15 1500      Date of Initial Exercise RX and Referring Provider   Date 10/05/15   Referring Provider Ida Rogue MD     Treadmill   MPH 1.4   Grade 0   Minutes 15   METs 2.07     Recumbant Elliptical   Level 1   RPM 50   Minutes 15   METs 2     T5 Nustep   Level 1   Minutes 15   METs 2     Prescription Details   Frequency (times per week) 3     Intensity   THRR 40-80% of Max Heartrate 95-126   Ratings of Perceived Exertion 11-15   Perceived Dyspnea 0-4     Progression   Progression Continue to progress workloads to maintain intensity without signs/symptoms of physical distress.     Resistance Training   Training Prescription Yes   Weight 2 lbs   Reps 10-15      Perform Capillary Blood Glucose checks as needed.  Exercise Prescription Changes:  Exercise Prescription Changes    Row Name  10/05/15 1300             Response to Exercise   Blood Pressure (Admit) 126/70       Blood Pressure (Exercise) 146/70       Blood Pressure (Exit) 134/70       Heart Rate (Admit) 62 bpm       Heart Rate (Exercise) 98 bpm       Heart Rate (Exit) 64 bpm       Rating of Perceived Exertion (Exercise) 12          Exercise Comments:     Exercise Comments    Row Name 12/14/15 1828           Exercise Comments First full day of exercise! [name] [name] was oriented to gym and equipment including functions, settings, policies, and procedures. [name] [name]'s individual exercise prescription and treatment plan were reviewed.  All starting workloads were established based on the results of the 6 minute walk test done at initial orientation visit.  The plan for exercise progression was also introduced and progression will be customized based on patient's performance and goals.          Discharge Exercise Prescription (Final Exercise Prescription Changes):     Exercise Prescription Changes - 10/05/15 1300      Response to Exercise   Blood Pressure (Admit) 126/70   Blood Pressure (Exercise) 146/70   Blood Pressure (Exit) 134/70   Heart Rate (Admit) 62 bpm   Heart Rate (Exercise) 98 bpm   Heart Rate (Exit) 64 bpm   Rating of Perceived Exertion (Exercise) 12      Nutrition:  Target Goals: Understanding of nutrition guidelines, daily intake of sodium 1500mg , cholesterol 200mg , calories 30% from fat and 7% or less from saturated fats, daily to have 5 or more servings of fruits and vegetables.  Biometrics:     Pre Biometrics - 10/05/15 1512      Pre Biometrics   Height 5' 11.8" (1.824 m)   Weight 216 lb 11.2 oz (98.3 kg)   Waist Circumference 45.25 inches   Hip Circumference 43 inches   Waist to Hip Ratio 1.05 %   BMI (Calculated) 29.6   Single Leg Stand 9.2 seconds       Nutrition Therapy Plan and Nutrition Goals:     Nutrition Therapy & Goals - 10/05/15 1521      Intervention  Plan   Intervention Prescribe, educate and counsel regarding individualized specific dietary modifications aiming towards targeted core components such as weight, hypertension, lipid management, diabetes, heart failure and other comorbidities.   Expected Outcomes Short Term Goal: Understand basic principles of dietary content, such as calories, fat, sodium, cholesterol and nutrients.;Short Term Goal: A plan has been developed with personal nutrition goals set during dietitian appointment.;Long Term Goal: Adherence to prescribed nutrition plan.      Nutrition Discharge: Rate Your Plate Scores:     Nutrition Assessments - 10/23/15 1428      Rate Your Plate Scores   Pre Score 58   Pre Score % 64 %      Nutrition Goals Re-Evaluation:   Psychosocial: Target Goals: Acknowledge presence or absence of depression, maximize coping skills, provide positive support system. Participant is able to verbalize types and ability to use techniques and skills needed for reducing stress and depression.  Initial Review & Psychosocial Screening:     Initial Psych Review & Screening - 10/05/15 1521  Family Dynamics   Good Support System? Yes     Barriers   Psychosocial barriers to participate in program The patient should benefit from training in stress management and relaxation.;There are no identifiable barriers or psychosocial needs.     Screening Interventions   Interventions Encouraged to exercise      Quality of Life Scores:     Quality of Life - 10/05/15 1521      Quality of Life Scores   Health/Function Pre 24.86 %   Socioeconomic Pre 30 %   Psych/Spiritual Pre 30 %   Family Pre 25.2 %   GLOBAL Pre 27.18 %      PHQ-9: Recent Review Flowsheet Data    Depression screen [facility] 2/9 10/05/2015 02/06/2015 02/05/2014   Decreased Interest 0 0 0   Down, Depressed, Hopeless 0 0 0   PHQ - 2 Score 0 0 0   Altered sleeping 1 - -   Tired, decreased energy 2 - -   Change in appetite 2 - -    Feeling bad or failure about yourself  0 - -   Trouble concentrating 0 - -   Moving slowly or fidgety/restless 0 - -   Suicidal thoughts 0 - -   PHQ-9 Score 5 - -   Difficult doing work/chores Not difficult at all - -      Psychosocial Evaluation and Intervention:   Psychosocial Re-Evaluation:   Vocational Rehabilitation: Provide vocational rehab assistance to qualifying candidates.   Vocational Rehab Evaluation & Intervention:     Vocational Rehab - 10/05/15 1528      Initial Vocational Rehab Evaluation & Intervention   Assessment shows need for Vocational Rehabilitation No      Education: Education Goals: Education classes will be provided on a weekly basis, covering required topics. Participant will state understanding/return demonstration of topics presented.  Learning Barriers/Preferences:     Learning Barriers/Preferences - 10/05/15 1527      Learning Barriers/Preferences   Learning Barriers None   Learning Preferences None      Education Topics: General Nutrition Guidelines/Fats and Fiber: -Group instruction provided by verbal, written material, models and posters to present the general guidelines for heart healthy nutrition. Gives an explanation and review of dietary fats and fiber.   Controlling Sodium/Reading Food Labels: -Group verbal and written material supporting the discussion of sodium use in heart healthy nutrition. Review and explanation with models, verbal and written materials for utilization of the food label.   Exercise Physiology & Risk Factors: - Group verbal and written instruction with models to review the exercise physiology of the cardiovascular system and associated critical values. Details cardiovascular disease risk factors and the goals associated with each risk factor.   Aerobic Exercise & Resistance Training: - Gives group verbal and written discussion on the health impact of inactivity. On the components of aerobic and  resistive training programs and the benefits of this training and how to safely progress through these programs.   Flexibility, Balance, General Exercise Guidelines: - Provides group verbal and written instruction on the benefits of flexibility and balance training programs. Provides general exercise guidelines with specific guidelines to those with heart or lung disease. Demonstration and skill practice provided. Flowsheet Row Cardiac Rehab from 12/14/2015 in [facility] Cardiac and Pulmonary Rehab  Date  12/14/15  Educator  [facility]  Instruction Review Code  2- meets goals/outcomes      Stress Management: - Provides group verbal and written instruction about the health risks of elevated stress,  cause of high stress, and healthy ways to reduce stress.   Depression: - Provides group verbal and written instruction on the correlation between heart/lung disease and depressed mood, treatment options, and the stigmas associated with seeking treatment.   Anatomy & Physiology of the Heart: - Group verbal and written instruction and models provide basic cardiac anatomy and physiology, with the coronary electrical and arterial systems. Review of: AMI, Angina, Valve disease, Heart Failure, Cardiac Arrhythmia, Pacemakers, and the ICD.   Cardiac Procedures: - Group verbal and written instruction and models to describe the testing methods done to diagnose heart disease. Reviews the outcomes of the test results. Describes the treatment choices: Medical Management, Angioplasty, or Coronary Bypass Surgery.   Cardiac Medications: - Group verbal and written instruction to review commonly prescribed medications for heart disease. Reviews the medication, class of the drug, and side effects. Includes the steps to properly store meds and maintain the prescription regimen.   Go Sex-Intimacy & Heart Disease, Get SMART - Goal Setting: - Group verbal and written instruction through game format to discuss heart disease and  the return to sexual intimacy. Provides group verbal and written material to discuss and apply goal setting through the application of the S.M.A.R.T. Method.   Other Matters of the Heart: - Provides group verbal, written materials and models to describe Heart Failure, Angina, Valve Disease, and Diabetes in the realm of heart disease. Includes description of the disease process and treatment options available to the cardiac patient.   Exercise & Equipment Safety: - Individual verbal instruction and demonstration of equipment use and safety with use of the equipment. Flowsheet Row Cardiac Rehab from 12/14/2015 in [facility] Cardiac and Pulmonary Rehab  Date  10/05/15  Educator  SB  Instruction Review Code  2- meets goals/outcomes      Infection Prevention: - Provides verbal and written material to individual with discussion of infection control including proper hand washing and proper equipment cleaning during exercise session. Flowsheet Row Cardiac Rehab from 12/14/2015 in [facility] Cardiac and Pulmonary Rehab  Date  10/05/15  Educator  SB  Instruction Review Code  2- meets goals/outcomes      Falls Prevention: - Provides verbal and written material to individual with discussion of falls prevention and safety.   Diabetes: - Individual verbal and written instruction to review signs/symptoms of diabetes, desired ranges of glucose level fasting, after meals and with exercise. Advice that pre and post exercise glucose checks will be done for 3 sessions at entry of program. Flowsheet Row Cardiac Rehab from 12/14/2015 in [facility] Cardiac and Pulmonary Rehab  Date  10/05/15  Educator  SB  Instruction Review Code  2- meets goals/outcomes       Knowledge Questionnaire Score:     Knowledge Questionnaire Score - 10/05/15 1527      Knowledge Questionnaire Score   Pre Score 22/28      Core Components/Risk Factors/Patient Goals at Admission:     Personal Goals and Risk Factors at Admission -  10/05/15 1509      Core Components/Risk Factors/Patient Goals on Admission    Weight Management Yes;Weight Loss;Obesity   Intervention Weight Management: Develop a combined nutrition and exercise program designed to reach desired caloric intake, while maintaining appropriate intake of nutrient and fiber, sodium and fats, and appropriate energy expenditure required for the weight goal.;Weight Management: Provide education and appropriate resources to help participant work on and attain dietary goals.;Weight Management/Obesity: Establish reasonable short term and long term weight goals.;Obesity: Provide education and  appropriate resources to help participant work on and attain dietary goals.   Admit Weight 216 lb 11.2 oz (98.3 kg)   Goal Weight: Short Term 210 lb (95.3 kg)   Goal Weight: Long Term 200 lb (90.7 kg)   Expected Outcomes Long Term: Adherence to nutrition and physical activity/exercise program aimed toward attainment of established weight goal;Short Term: Continue to assess and modify interventions until short term weight is achieved;Weight Loss: Understanding of general recommendations for a balanced deficit meal plan, which promotes 1-2 lb weight loss per week and includes a negative energy balance of [phone] kcal/d;Understanding recommendations for meals to include 15-35% energy as protein, 25-35% energy from fat, 35-60% energy from carbohydrates, less than 200mg  of dietary cholesterol, 20-35 gm of total fiber daily;Understanding of distribution of calorie intake throughout the day with the consumption of 4-5 meals/snacks   Increase Strength and Stamina Yes   Intervention Provide advice, education, support and counseling about physical activity/exercise needs.;Develop an individualized exercise prescription for aerobic and resistive training based on initial evaluation findings, risk stratification, comorbidities and participant's personal goals.   Expected Outcomes Achievement of increased  cardiorespiratory fitness and enhanced flexibility, muscular endurance and strength shown through measurements of functional capacity and personal statement of participant.   Diabetes Yes   Intervention Provide education about signs/symptoms and action to take for hypo/hyperglycemia.;Provide education about proper nutrition, including hydration, and aerobic/resistive exercise prescription along with prescribed medications to achieve blood glucose in normal ranges: Fasting glucose 65-99 mg/dL   Expected Outcomes Short Term: Participant verbalizes understanding of the signs/symptoms and immediate care of hyper/hypoglycemia, proper foot care and importance of medication, aerobic/resistive exercise and nutrition plan for blood glucose control.;Long Term: Attainment of HbA1C < 7%.   Heart Failure Yes   Intervention Provide a combined exercise and nutrition program that is supplemented with education, support and counseling about heart failure. Directed toward relieving symptoms such as shortness of breath, decreased exercise tolerance, and extremity edema.   Expected Outcomes Improve functional capacity of life;Short term: Attendance in program 2-3 days a week with increased exercise capacity. Reported lower sodium intake. Reported increased fruit and vegetable intake. Reports medication compliance.;Short term: Daily weights obtained and reported for increase. Utilizing diuretic protocols set by physician.;Long term: Adoption of self-care skills and reduction of barriers for early signs and symptoms recognition and intervention leading to self-care maintenance.   Hypertension Yes   Intervention Provide education on lifestyle modifcations including regular physical activity/exercise, weight management, moderate sodium restriction and increased consumption of fresh fruit, vegetables, and low fat dairy, alcohol moderation, and smoking cessation.;Monitor prescription use compliance.   Expected Outcomes Short Term:  Continued assessment and intervention until BP is < 140/27mm HG in hypertensive participants. < 130/36mm HG in hypertensive participants with diabetes, heart failure or chronic kidney disease.;Long Term: Maintenance of blood pressure at goal levels.   Lipids Yes   Intervention Provide education and support for participant on nutrition & aerobic/resistive exercise along with prescribed medications to achieve LDL 70mg , HDL >40mg .   Expected Outcomes Short Term: Participant states understanding of desired cholesterol values and is compliant with medications prescribed. Participant is following exercise prescription and nutrition guidelines.;Long Term: Cholesterol controlled with medications as prescribed, with individualized exercise RX and with personalized nutrition plan. Value goals: LDL < 70mg , HDL > 40 mg.   Personal Goal Other Yes   Personal Goal Breathe better during exercise   Intervention Provide education and exercise prescription and include tips for pursed lip breathing   Expected Outcomes  Able to feel less short of breath with exercise.      Core Components/Risk Factors/Patient Goals Review:    Core Components/Risk Factors/Patient Goals at Discharge (Final Review):    ITP Comments:     ITP Comments    Row Name 10/05/15 1519 12/01/15 1537 12/16/15 0659       ITP Comments Initial visit completed today ITP created.  Continue with ITP ITP ready for initial signature.  Documentation of diagnosis found in Office Note 11/23/2015 [facility] 30 day review. Continue with ITP unless changes noted by Medical Director at signature of review. Has started classes in this month        Comments:

## 2015-12-16 NOTE — Progress Notes (Signed)
Daily Session Note  Patient Details  Name: KHYRE GERMOND MRN: [id number] Date of Birth: 01/25/1934 Referring Provider:   Flowsheet Row Cardiac Rehab from 10/05/2015 in [facility] Cardiac and Pulmonary Rehab  Referring Provider  Ida Rogue MD      Encounter Date: 12/16/2015  Check In:     Session Check In - 12/16/15 1754      Check-In   Location [facility]-Cardiac & Pulmonary Rehab   Staff Present Nyoka Cowden, RN, BSN, MA;Betsaida [place], RN, BSN;Susanne Bice, RN, BSN, CCRP   Supervising physician immediately available to respond to emergencies See telemetry face sheet for immediately available ER MD   Medication changes reported     No   Fall or balance concerns reported    No   Warm-up and Cool-down Performed on first and last piece of equipment   Resistance Training Performed No   VAD Patient? No     Pain Assessment   Currently in Pain? No/denies         Goals Met:  Proper associated with RPD/PD & O2 Sat Exercise tolerated well  Goals Unmet:  Not Applicable  Comments:     Dr. Emily Filbert is Medical Director for [facility] and [facility].

## 2015-12-17 ENCOUNTER — Ambulatory Visit: Payer: Medicare Other

## 2015-12-21 ENCOUNTER — Ambulatory Visit: Payer: Medicare Other

## 2015-12-23 ENCOUNTER — Ambulatory Visit: Payer: Medicare Other

## 2015-12-24 ENCOUNTER — Ambulatory Visit: Payer: Medicare Other

## 2015-12-28 ENCOUNTER — Ambulatory Visit: Payer: Medicare Other

## 2015-12-30 ENCOUNTER — Ambulatory Visit: Payer: Medicare Other

## 2015-12-31 ENCOUNTER — Encounter: Payer: Medicare Other | Admitting: *Deleted

## 2015-12-31 ENCOUNTER — Ambulatory Visit: Payer: Medicare Other

## 2015-12-31 DIAGNOSIS — Z951 Presence of aortocoronary bypass graft: Secondary | ICD-10-CM | POA: Diagnosis not present

## 2015-12-31 DIAGNOSIS — I208 Other forms of angina pectoris: Secondary | ICD-10-CM

## 2015-12-31 DIAGNOSIS — I5032 Chronic diastolic (congestive) heart failure: Secondary | ICD-10-CM | POA: Diagnosis not present

## 2015-12-31 LAB — GLUCOSE, CAPILLARY
Glucose-Capillary: 275 mg/dL — ABNORMAL HIGH (ref 65–99)
Glucose-Capillary: 165 mg/dL — ABNORMAL HIGH (ref 65–99)

## 2015-12-31 NOTE — Progress Notes (Signed)
Daily Session Note  Patient Details  Name: [name] [name] MRN: [id number] Date of Birth: 03/16/1933 Referring Provider:   Flowsheet Row Cardiac Rehab from 10/05/2015 in [facility] Cardiac and Pulmonary Rehab  Referring Provider  Gollan, Timothy MD      Encounter Date: 12/31/2015  Check In:     Session Check In - 12/31/15 1731      Check-In   Location [facility]-Cardiac & Pulmonary Rehab   Staff Present Carroll Enterkin, RN, BSN;Kelly Hayes, BS, ACSM CEP, Exercise Physiologist;Amanda Sommer, BA, ACSM CEP, Exercise Physiologist   Supervising physician immediately available to respond to emergencies See telemetry face sheet for immediately available ER MD   Medication changes reported     No   Fall or balance concerns reported    No   Warm-up and Cool-down Performed on first and last piece of equipment   Resistance Training Performed Yes   VAD Patient? No     Pain Assessment   Currently in Pain? No/denies   Multiple Pain Sites No         Goals Met:  Independence with exercise equipment Exercise tolerated well No report of cardiac concerns or symptoms Strength training completed today  Goals Unmet:  Not Applicable  Comments: Pt able to follow exercise prescription today without complaint.  Will continue to monitor for progression.    Dr. Mark Miller is Medical Director for [facility] and [facility]. 

## 2016-01-04 ENCOUNTER — Encounter: Payer: Medicare Other | Admitting: *Deleted

## 2016-01-04 ENCOUNTER — Ambulatory Visit: Payer: Medicare Other

## 2016-01-04 DIAGNOSIS — I208 Other forms of angina pectoris: Secondary | ICD-10-CM

## 2016-01-04 DIAGNOSIS — I5032 Chronic diastolic (congestive) heart failure: Secondary | ICD-10-CM | POA: Diagnosis not present

## 2016-01-04 DIAGNOSIS — Z951 Presence of aortocoronary bypass graft: Secondary | ICD-10-CM | POA: Diagnosis not present

## 2016-01-04 NOTE — Progress Notes (Signed)
Daily Session Note  Patient Details  Name: [name] [name] MRN: [id number] Date of Birth: [birth year] Referring Provider:   Flowsheet Row Cardiac Rehab from 10/05/2015 in [facility]  Referring Provider  Ida Rogue MD      Encounter Date: 01/04/2016  Check In:     Session Check In - 01/04/16 1801      Check-In   Staff Present Heath Lark, RN, BSN, CCRP;Carroll Enterkin, RN, Moises Blood, BS, ACSM CEP, Exercise Physiologist   Supervising physician immediately available to respond to emergencies See telemetry face sheet for immediately available ER MD   Medication changes reported     No   Fall or balance concerns reported    No   Warm-up and Cool-down Performed on first and last piece of equipment   Resistance Training Performed Yes   VAD Patient? No     Pain Assessment   Currently in Pain? No/denies         Goals Met:  Exercise tolerated well No report of cardiac concerns or symptoms Strength training completed today  Goals Unmet:  Not Applicable  Comments: Doing well with exercise prescription progression.    Dr. Emily Filbert is Medical Director for [place] and [facility].

## 2016-01-06 ENCOUNTER — Ambulatory Visit: Payer: Medicare Other

## 2016-01-06 ENCOUNTER — Encounter: Payer: Medicare Other | Attending: Cardiovascular Disease

## 2016-01-06 DIAGNOSIS — I5032 Chronic diastolic (congestive) heart failure: Secondary | ICD-10-CM | POA: Insufficient documentation

## 2016-01-06 DIAGNOSIS — Z951 Presence of aortocoronary bypass graft: Secondary | ICD-10-CM | POA: Insufficient documentation

## 2016-01-13 ENCOUNTER — Other Ambulatory Visit: Payer: Self-pay | Admitting: Cardiovascular Disease

## 2016-01-13 ENCOUNTER — Encounter: Payer: Self-pay | Admitting: *Deleted

## 2016-01-13 DIAGNOSIS — I208 Other forms of angina pectoris: Secondary | ICD-10-CM

## 2016-01-13 DIAGNOSIS — I5032 Chronic diastolic (congestive) heart failure: Secondary | ICD-10-CM | POA: Diagnosis not present

## 2016-01-13 DIAGNOSIS — Z951 Presence of aortocoronary bypass graft: Secondary | ICD-10-CM | POA: Diagnosis not present

## 2016-01-13 NOTE — Progress Notes (Signed)
Daily Session Note  Patient Details  Name: ABDULAH IQBAL MRN: [id number] Date of Birth: [birth year] Referring Provider:   Flowsheet Row Cardiac Rehab from 10/05/2015 in [facility] Cardiac and Pulmonary Rehab  Referring Provider  Ida Rogue MD      Encounter Date: 01/13/2016  Check In:     Session Check In - 01/13/16 1631      Check-In   Location [facility]-Cardiac & Pulmonary Rehab   Staff Present Gerlene Burdock, RN, BSN;Babetta Paterson, DPT, Burlene Arnt, BA, ACSM CEP, Exercise Physiologist   Supervising physician immediately available to respond to emergencies See telemetry face sheet for immediately available ER MD   Medication changes reported     No   Fall or balance concerns reported    No   Warm-up and Cool-down Performed on first and last piece of equipment   Resistance Training Performed Yes   VAD Patient? No     Pain Assessment   Currently in Pain? No/denies   Multiple Pain Sites No         Goals Met:  Independence with exercise equipment  Goals Unmet:  Not Applicable  Comments: Patient completed exercise prescription and all exercise goals during rehab session. The exercise was tolerated well and the patient is progressing in the program.    Dr. Emily Filbert is Medical Director for [place] and [facility].

## 2016-01-13 NOTE — Progress Notes (Signed)
Cardiac Individual Treatment Plan  Patient Details  Name: [name] [name] MRN: [id number] Date of Birth: Mar 27, 1933 Referring Provider:   Flowsheet Row Cardiac Rehab from 10/05/2015 in [facility] Cardiac and Pulmonary Rehab  Referring Provider  Ida Rogue MD      Initial Encounter Date:  Flowsheet Row Cardiac Rehab from 10/05/2015 in [facility] Cardiac and Pulmonary Rehab  Date  10/05/15  Referring Provider  Ida Rogue MD      Visit Diagnosis: Stable angina [facility])  Patient's Home Medications on Admission:  Current Outpatient Prescriptions:  .  acetaminophen (TYLENOL) 650 MG CR tablet, Take 650 mg by mouth every 8 (eight) hours as needed for pain. For hand arthritis, Disp: , Rfl:  .  aspirin 81 MG tablet, Take 162 mg by mouth daily. , Disp: , Rfl:  .  atorvastatin (LIPITOR) 40 MG tablet, Take 1 tablet (40 mg total) by mouth every other day., Disp: 90 tablet, Rfl: 3 .  BD INSULIN SYRINGE ULTRAFINE 31G X 15/64" 0.3 ML MISC, USE AS DIRECTED, Disp: 100 each, Rfl: 3 .  Biotin 1000 MCG tablet, Take 1,000 mcg by mouth daily., Disp: , Rfl:  .  carvedilol (COREG) 12.5 MG tablet, TAKE ONE TABLET TWICE A DAY WITH MEALS., Disp: 180 tablet, Rfl: 3 .  finasteride (PROSCAR) 5 MG tablet, Take 1 tablet (5 mg total) by mouth daily., Disp: 90 tablet, Rfl: 1 .  furosemide (LASIX) 20 MG tablet, Take 1 tablet (20 mg total) by mouth 2 (two) times daily as needed. (Patient taking differently: Take 20 mg by mouth daily. ), Disp: 60 tablet, Rfl: 6 .  glimepiride (AMARYL) 1 MG tablet, TAKE 1/2 TABLET AM DAILY., Disp: , Rfl:  .  LANTUS 100 UNIT/ML injection, Inject 18 Units into the skin 2 (two) times daily. , Disp: , Rfl:  .  meloxicam (MOBIC) 15 MG tablet, Take 15 mg by mouth daily. , Disp: , Rfl:  .  omeprazole (PRILOSEC) 20 MG capsule, TAKE ONE CAPSULE TWICE A DAY, Disp: 60 capsule, Rfl: 11 .  potassium chloride (K-DUR) 10 MEQ tablet, Take 1 tablet (10 mEq total) by mouth daily., Disp: 30 tablet, Rfl: 6 .   ramipril (ALTACE) 10 MG capsule, Take 1 capsule (10 mg total) by mouth daily., Disp: 90 capsule, Rfl: 3 .  traMADol (ULTRAM) 50 MG tablet, Take 50 mg by mouth as needed. , Disp: , Rfl:   Past Medical History: Past Medical History:  Diagnosis Date  . Cataract   . Coronary artery disease    a. 4v CABG 01/2005; b. cath 06/05/13 showed patent grafts: LIMA-LAD, VG-D, VG-OM3, VG-dRCA, nl filling pressures, nl LV fxn  . Diastolic dysfunction    a. echo 0000000: nl systolic fxn, mild LVH, diastolic relaxation abnormality, mildly enlarged LA, mild Ao insufficiency  . ED (erectile dysfunction)   . GERD (gastroesophageal reflux disease)   . Hyperlipidemia   . Hypertension   . Hypertrophy of prostate without urinary obstruction and other lower urinary tract symptoms (LUTS)   . IDDM (insulin dependent diabetes mellitus) ([place]) 2000  . Osteoarthritis   . Perirectal fistula   . Tubular adenoma of colon 07/2012    Tobacco Use: History  Smoking Status  . Never Smoker  Smokeless Tobacco  . Never Used    Labs: Recent Review Flowsheet Data    Labs for ITP Cardiac and Pulmonary Rehab Latest Ref Rng & Units 07/30/2013 11/05/2013 07/04/2014 11/07/2014 02/06/2015   Cholestrol 0 - 200 mg/dL 112 - 172 - 111  LDLCALC 0 - 99 mg/dL 61 - 110(H) - 61   HDL >39.00 mg/dL 39(L) - 39(L) - 31.90(L)   Trlycerides 0.0 - 149.0 mg/dL 59 - 115 - 92.0   Hemoglobin A1c 4.0 - 6.0 % - 6.5(A) - 7.0(A) -   PHART 7.350 - 7.450 - - - - -   PCO2ART 35.0 - 45.0 mmHg - - - - -   HCO3 20.0 - 24.0 mEq/L - - - - -   TCO2 0 - 100 mmol/L - - - - -   O2SAT % - - - - -       Exercise Target Goals:    Exercise Program Goal: Individual exercise prescription set with THRR, safety & activity barriers. Participant demonstrates ability to understand and report RPE using BORG scale, to self-measure pulse accurately, and to acknowledge the importance of the exercise prescription.  Exercise Prescription Goal: Starting with aerobic activity  30 plus minutes a day, 3 days per week for initial exercise prescription. Provide home exercise prescription and guidelines that participant acknowledges understanding prior to discharge.  Activity Barriers & Risk Stratification:     Activity Barriers & Cardiac Risk Stratification - 10/05/15 1526      Activity Barriers & Cardiac Risk Stratification   Activity Barriers --  Has had pain arounf edge of rib cage and towards his back since Jan this year.  Been to several doctors without a diagnosis. Last 2 weeks had PT with stretching and accupuncture with some relief. Has Pain clinic app[facility] 8[facility]1 in [facility]      6 Minute Walk:     [place] Name 10/05/15 1501         6 Minute Walk   Phase Initial     Distance 1165 feet     Walk Time 6 minutes     # of Rest Breaks 0     MPH 2.21     METS 2.08     RPE 12     VO2 Peak 7.28     Symptoms No     Resting HR 64 bpm     Resting BP 126/70     Max Ex. HR 87 bpm     Max Ex. BP 146/70     2 Minute Post BP 134/70        Initial Exercise Prescription:     Initial Exercise Prescription - 10/05/15 1500      Date of Initial Exercise RX and Referring Provider   Date 10/05/15   Referring Provider Ida Rogue MD     Treadmill   MPH 1.4   Grade 0   Minutes 15   METs 2.07     Recumbant Elliptical   Level 1   RPM 50   Minutes 15   METs 2     T5 Nustep   Level 1   Minutes 15   METs 2     Prescription Details   Frequency (times per week) 3     Intensity   THRR 40-80% of Max Heartrate 95-126   Ratings of Perceived Exertion 11-15   Perceived Dyspnea 0-4     Progression   Progression Continue to progress workloads to maintain intensity without signs/symptoms of physical distress.     Resistance Training   Training Prescription Yes   Weight 2 lbs   Reps 10-15      Perform Capillary Blood Glucose checks as needed.  Exercise Prescription Changes:  Exercise Prescription Changes    Row Name  10/05/15 1300 12/24/15 1200 01/06/16 1200         Exercise Review   Progression  -  - Yes       Response to Exercise   Blood Pressure (Admit) 126/70 168/80  -     Blood Pressure (Exercise) 146/70 138/78 186/70     Blood Pressure (Exit) 134/70 132/72 124/72     Heart Rate (Admit) 62 bpm 58 bpm 79 bpm     Heart Rate (Exercise) 98 bpm 92 bpm 103 bpm     Heart Rate (Exit) 64 bpm 66 bpm 79 bpm     Rating of Perceived Exertion (Exercise) 12 12 11      Duration  - Progress to 45 minutes of aerobic exercise without signs/symptoms of physical distress Progress to 45 minutes of aerobic exercise without signs/symptoms of physical distress     Intensity  - THRR unchanged THRR unchanged       Progression   Progression  -  - Continue to progress workloads to maintain intensity without signs/symptoms of physical distress.     Average METs  -  - 1.97       Resistance Training   Training Prescription  - Yes Yes     Weight  - 2 1     Reps  - 10-12 10-15       Interval Training   Interval Training  - No No       Treadmill   MPH  - 1.4 1.4     Grade  - 0 0     Minutes  - 15 15     METs  - 2.07 2.07       Recumbant Elliptical   Level  - 1  -     Minutes  - 15  -        Exercise Comments:     Exercise Comments    Row Name 12/14/15 1828 12/24/15 1212 01/06/16 1254       Exercise Comments First full day of exercise! [name] was oriented to gym and equipment including functions, settings, policies, and procedures. [name]'s individual exercise prescription and treatment plan were reviewed.  All starting workloads were established based on the results of the 6 minute walk test done at initial orientation visit.  The plan for exercise progression was also introduced and progression will be customized based on patient's performance and goals. Mr Moresi is progressing well with exercise. Mr Brehm is doing well with exercise.        Discharge Exercise Prescription (Final Exercise Prescription  Changes):     Exercise Prescription Changes - 01/06/16 1200      Exercise Review   Progression Yes     Response to Exercise   Blood Pressure (Exercise) 186/70   Blood Pressure (Exit) 124/72   Heart Rate (Admit) 79 bpm   Heart Rate (Exercise) 103 bpm   Heart Rate (Exit) 79 bpm   Rating of Perceived Exertion (Exercise) 11   Duration Progress to 45 minutes of aerobic exercise without signs/symptoms of physical distress   Intensity THRR unchanged     Progression   Progression Continue to progress workloads to maintain intensity without signs/symptoms of physical distress.   Average METs 1.97     Resistance Training   Training Prescription Yes   Weight 1   Reps 10-15     Interval Training   Interval Training No     Treadmill  MPH 1.4   Grade 0   Minutes 15   METs 2.07      Nutrition:  Target Goals: Understanding of nutrition guidelines, daily intake of sodium 1500mg , cholesterol 200mg , calories 30% from fat and 7% or less from saturated fats, daily to have 5 or more servings of fruits and vegetables.  Biometrics:     Pre Biometrics - 10/05/15 1512      Pre Biometrics   Height 5' 11.8" (1.824 m)   Weight 216 lb 11.2 oz (98.3 kg)   Waist Circumference 45.25 inches   Hip Circumference 43 inches   Waist to Hip Ratio 1.05 %   BMI (Calculated) 29.6   Single Leg Stand 9.2 seconds       Nutrition Therapy Plan and Nutrition Goals:     Nutrition Therapy & Goals - 01/04/16 1342      Nutrition Therapy   Diet Diabetes diet (takes 2 insulin injections a day)    Drug/Food Interactions Statins/Certain Fruits     Personal Nutrition Goals   Comments "[name] [name]" said he knows what to eat.       Nutrition Discharge: Rate Your Plate Scores:     Nutrition Assessments - 10/23/15 1428      Rate Your Plate Scores   Pre Score 58   Pre Score % 64 %      Nutrition Goals Re-Evaluation:     Nutrition Goals Re-Evaluation    Row Name 01/04/16 1352              Personal Goal #1 Re-Evaluation   Personal Goal #1 Heart healthy diabetic diet       Goal Progress Seen Yes       Comments "[name] [name]" Jariel said he would prefer not to meet individually with the Cardiac Rehab Registered Dietician. [name] [name] said "I am 82 and set in my ways somedays but my blood sugar this am was 103 and yesterday 133.          Intervention Plan   Intervention Continue to educate, counsel and set short/long term goals regarding individualized specific personal dietary modifications.          Psychosocial: Target Goals: Acknowledge presence or absence of depression, maximize coping skills, provide positive support system. Participant is able to verbalize types and ability to use techniques and skills needed for reducing stress and depression.  Initial Review & Psychosocial Screening:     Initial Psych Review & Screening - 10/05/15 [place]? Yes     Barriers   Psychosocial barriers to participate in program The patient should benefit from training in stress management and relaxation.;There are no identifiable barriers or psychosocial needs.     Screening Interventions   Interventions Encouraged to exercise      Quality of Life Scores:     Quality of Life - 10/05/15 1521      Quality of Life Scores   Health/Function Pre 24.86 %   Socioeconomic Pre 30 %   Psych/Spiritual Pre 30 %   Family Pre 25.2 %   GLOBAL Pre 27.18 %      PHQ-9: Recent Review Flowsheet Data    Depression screen [facility] 2/9 10/05/2015 02/06/2015 02/05/2014   Decreased Interest 0 0 0   Down, Depressed, Hopeless 0 0 0   PHQ - 2 Score 0 0 0   Altered sleeping 1 - -   Tired, decreased energy 2 - -   Change in  appetite 2 - -   Feeling bad or failure about yourself  0 - -   Trouble concentrating 0 - -   Moving slowly or fidgety/restless 0 - -   Suicidal thoughts 0 - -   PHQ-9 Score 5 - -   Difficult doing work/chores Not difficult at all - -      Psychosocial  Evaluation and Intervention:     Psychosocial Evaluation - 01/04/16 1359      Psychosocial Evaluation & Interventions   Interventions Encouraged to exercise with the program and follow exercise prescription      Psychosocial Re-Evaluation:     Psychosocial Re-Evaluation    [place] Name 01/04/16 1359             Psychosocial Re-Evaluation   Interventions Encouraged to attend Cardiac Rehabilitation for the exercise       Comments "[name] [name]" Shirline Frees said "I try not to get stressed too much". We discussed how riding his bicycle in his neighborhood is good for exercise and to decrease any stress.           Vocational Rehabilitation: Provide vocational rehab assistance to qualifying candidates.   Vocational Rehab Evaluation & Intervention:     Vocational Rehab - 10/05/15 1528      Initial Vocational Rehab Evaluation & Intervention   Assessment shows need for Vocational Rehabilitation No      Education: Education Goals: Education classes will be provided on a weekly basis, covering required topics. Participant will state understanding/return demonstration of topics presented.  Learning Barriers/Preferences:     Learning Barriers/Preferences - 10/05/15 1527      Learning Barriers/Preferences   Learning Barriers None   Learning Preferences None      Education Topics: General Nutrition Guidelines/Fats and Fiber: -Group instruction provided by verbal, written material, models and posters to present the general guidelines for heart healthy nutrition. Gives an explanation and review of dietary fats and fiber.   Controlling Sodium/Reading Food Labels: -Group verbal and written material supporting the discussion of sodium use in heart healthy nutrition. Review and explanation with models, verbal and written materials for utilization of the food label.   Exercise Physiology & Risk Factors: - Group verbal and written instruction with models to review the exercise  physiology of the cardiovascular system and associated critical values. Details cardiovascular disease risk factors and the goals associated with each risk factor.   Aerobic Exercise & Resistance Training: - Gives group verbal and written discussion on the health impact of inactivity. On the components of aerobic and resistive training programs and the benefits of this training and how to safely progress through these programs.   Flexibility, Balance, General Exercise Guidelines: - Provides group verbal and written instruction on the benefits of flexibility and balance training programs. Provides general exercise guidelines with specific guidelines to those with heart or lung disease. Demonstration and skill practice provided. Flowsheet Row Cardiac Rehab from 12/14/2015 in [facility] Cardiac and Pulmonary Rehab  Date  12/14/15  Educator  [facility]  Instruction Review Code  2- meets goals/outcomes      Stress Management: - Provides group verbal and written instruction about the health risks of elevated stress, cause of high stress, and healthy ways to reduce stress.   Depression: - Provides group verbal and written instruction on the correlation between heart/lung disease and depressed mood, treatment options, and the stigmas associated with seeking treatment.   Anatomy & Physiology of the Heart: - Group verbal and written instruction and models provide  basic cardiac anatomy and physiology, with the coronary electrical and arterial systems. Review of: AMI, Angina, Valve disease, Heart Failure, Cardiac Arrhythmia, Pacemakers, and the ICD.   Cardiac Procedures: - Group verbal and written instruction and models to describe the testing methods done to diagnose heart disease. Reviews the outcomes of the test results. Describes the treatment choices: Medical Management, Angioplasty, or Coronary Bypass Surgery.   Cardiac Medications: - Group verbal and written instruction to review commonly prescribed  medications for heart disease. Reviews the medication, class of the drug, and side effects. Includes the steps to properly store meds and maintain the prescription regimen.   Go Sex-Intimacy & Heart Disease, Get SMART - Goal Setting: - Group verbal and written instruction through game format to discuss heart disease and the return to sexual intimacy. Provides group verbal and written material to discuss and apply goal setting through the application of the S.M.A.R.T. Method.   Other Matters of the Heart: - Provides group verbal, written materials and models to describe Heart Failure, Angina, Valve Disease, and Diabetes in the realm of heart disease. Includes description of the disease process and treatment options available to the cardiac patient.   Exercise & Equipment Safety: - Individual verbal instruction and demonstration of equipment use and safety with use of the equipment. Flowsheet Row Cardiac Rehab from 12/14/2015 in [facility] Cardiac and Pulmonary Rehab  Date  10/05/15  Educator  SB  Instruction Review Code  2- meets goals/outcomes      Infection Prevention: - Provides verbal and written material to individual with discussion of infection control including proper hand washing and proper equipment cleaning during exercise session. Flowsheet Row Cardiac Rehab from 12/14/2015 in [facility] Cardiac and Pulmonary Rehab  Date  10/05/15  Educator  SB  Instruction Review Code  2- meets goals/outcomes      Falls Prevention: - Provides verbal and written material to individual with discussion of falls prevention and safety.   Diabetes: - Individual verbal and written instruction to review signs/symptoms of diabetes, desired ranges of glucose level fasting, after meals and with exercise. Advice that pre and post exercise glucose checks will be done for 3 sessions at entry of program. Flowsheet Row Cardiac Rehab from 12/14/2015 in [facility] Cardiac and Pulmonary Rehab  Date  10/05/15  Educator  SB   Instruction Review Code  2- meets goals/outcomes       Knowledge Questionnaire Score:     Knowledge Questionnaire Score - 10/05/15 1527      Knowledge Questionnaire Score   Pre Score 22/28      Core Components/Risk Factors/Patient Goals at Admission:     Personal Goals and Risk Factors at Admission - 10/05/15 1509      Core Components/Risk Factors/Patient Goals on Admission    Weight Management Yes;Weight Loss;Obesity   Intervention Weight Management: Develop a combined nutrition and exercise program designed to reach desired caloric intake, while maintaining appropriate intake of nutrient and fiber, sodium and fats, and appropriate energy expenditure required for the weight goal.;Weight Management: Provide education and appropriate resources to help participant work on and attain dietary goals.;Weight Management/Obesity: Establish reasonable short term and long term weight goals.;Obesity: Provide education and appropriate resources to help participant work on and attain dietary goals.   Admit Weight 216 lb 11.2 oz (98.3 kg)   Goal Weight: Short Term 210 lb (95.3 kg)   Goal Weight: Long Term 200 lb (90.7 kg)   Expected Outcomes Long Term: Adherence to nutrition and physical activity/exercise program aimed  toward attainment of established weight goal;Short Term: Continue to assess and modify interventions until short term weight is achieved;Weight Loss: Understanding of general recommendations for a balanced deficit meal plan, which promotes 1-2 lb weight loss per week and includes a negative energy balance of [phone] kcal/d;Understanding recommendations for meals to include 15-35% energy as protein, 25-35% energy from fat, 35-60% energy from carbohydrates, less than 200mg  of dietary cholesterol, 20-35 gm of total fiber daily;Understanding of distribution of calorie intake throughout the day with the consumption of 4-5 meals/snacks   Increase Strength and Stamina Yes   Intervention  Provide advice, education, support and counseling about physical activity/exercise needs.;Develop an individualized exercise prescription for aerobic and resistive training based on initial evaluation findings, risk stratification, comorbidities and participant's personal goals.   Expected Outcomes Achievement of increased cardiorespiratory fitness and enhanced flexibility, muscular endurance and strength shown through measurements of functional capacity and personal statement of participant.   Diabetes Yes   Intervention Provide education about signs/symptoms and action to take for hypo/hyperglycemia.;Provide education about proper nutrition, including hydration, and aerobic/resistive exercise prescription along with prescribed medications to achieve blood glucose in normal ranges: Fasting glucose 65-99 mg/dL   Expected Outcomes Short Term: Participant verbalizes understanding of the signs/symptoms and immediate care of hyper/hypoglycemia, proper foot care and importance of medication, aerobic/resistive exercise and nutrition plan for blood glucose control.;Long Term: Attainment of HbA1C < 7%.   Heart Failure Yes   Intervention Provide a combined exercise and nutrition program that is supplemented with education, support and counseling about heart failure. Directed toward relieving symptoms such as shortness of breath, decreased exercise tolerance, and extremity edema.   Expected Outcomes Improve functional capacity of life;Short term: Attendance in program 2-3 days a week with increased exercise capacity. Reported lower sodium intake. Reported increased fruit and vegetable intake. Reports medication compliance.;Short term: Daily weights obtained and reported for increase. Utilizing diuretic protocols set by physician.;Long term: Adoption of self-care skills and reduction of barriers for early signs and symptoms recognition and intervention leading to self-care maintenance.   Hypertension Yes    Intervention Provide education on lifestyle modifcations including regular physical activity/exercise, weight management, moderate sodium restriction and increased consumption of fresh fruit, vegetables, and low fat dairy, alcohol moderation, and smoking cessation.;Monitor prescription use compliance.   Expected Outcomes Short Term: Continued assessment and intervention until BP is < 140/62mm HG in hypertensive participants. < 130/32mm HG in hypertensive participants with diabetes, heart failure or chronic kidney disease.;Long Term: Maintenance of blood pressure at goal levels.   Lipids Yes   Intervention Provide education and support for participant on nutrition & aerobic/resistive exercise along with prescribed medications to achieve LDL 70mg , HDL >40mg .   Expected Outcomes Short Term: Participant states understanding of desired cholesterol values and is compliant with medications prescribed. Participant is following exercise prescription and nutrition guidelines.;Long Term: Cholesterol controlled with medications as prescribed, with individualized exercise RX and with personalized nutrition plan. Value goals: LDL < 70mg , HDL > 40 mg.   Personal Goal Other Yes   Personal Goal Breathe better during exercise   Intervention Provide education and exercise prescription and include tips for pursed lip breathing   Expected Outcomes Able to feel less short of breath with exercise.      Core Components/Risk Factors/Patient Goals Review:      Goals and Risk Factor Review    Row Name 01/04/16 1353             Core Components/Risk Factors/Patient Goals Review  Personal Goals Review Weight Management/Obesity;Increase Strength and Stamina;Lipids;Hypertension;Diabetes       Review "[name] [name]" said he tries to ride his bicycle in his neighborhood the days he is not attending Cardiac Rehab. He said he still "gets out of air but realizes that the MD told him there are some clogged heart arteries that they can't  totally fix". He said his blood pressure has been up lately and he is going to get himself a new blood pressure cufff. [name] [name] admits to having a history of back pain and had a injection in his knee last month that increased his blood sugars but he reports his HBA1c is usually about 6.9. [name] [name] said he is not regular enough with his attendance with his Cardiac rehab to get his stamina back but he hopes to do better. I offered the 8am Tuesday /Thursday Cardiac REhab for a make up day but [name] [name] said he is not a morning person. He is also having company from [place] this evening but he hopes to attend Cardiac Rehab this evening. [name] [name] thanked me for my phone call to him to check up with him on an individual basis.        Expected Outcomes Cont [place] healthy lifestyle. Increased stamina. Better control of his blood pressure (please see telemetry sheets for his blood pressure).           Core Components/Risk Factors/Patient Goals at Discharge (Final Review):      Goals and Risk Factor Review - 01/04/16 1353      Core Components/Risk Factors/Patient Goals Review   Personal Goals Review Weight Management/Obesity;Increase Strength and Stamina;Lipids;Hypertension;Diabetes   Review "[name] [name]" said he tries to ride his bicycle in his neighborhood the days he is not attending Cardiac Rehab. He said he still "gets out of air but realizes that the MD told him there are some clogged heart arteries that they can't totally fix". He said his blood pressure has been up lately and he is going to get himself a new blood pressure cufff. [name] [name] admits to having a history of back pain and had a injection in his knee last month that increased his blood sugars but he reports his HBA1c is usually about 6.9. [name] [name] said he is not regular enough with his attendance with his Cardiac rehab to get his stamina back but he hopes to do better. I offered the 8am Tuesday /Thursday Cardiac REhab for a make up day but [name] [name] said he is not a morning person. He is  also having company from [place] this evening but he hopes to attend Cardiac Rehab this evening. [name] [name] thanked me for my phone call to him to check up with him on an individual basis.    Expected Outcomes Cont [place]earth healthy lifestyle. Increased stamina. Better control of his blood pressure (please see telemetry sheets for his blood pressure).       ITP Comments:     ITP Comments    Row Name 10/05/15 1519 12/01/15 1537 12/16/15 0659 01/13/16 0628     ITP Comments Initial visit completed today ITP created.  Continue with ITP ITP ready for initial signature.  Documentation of diagnosis found in Office Note 11/23/2015 [facility] 30 day review. Continue with ITP unless changes noted by Medical Director at signature of review. Has started classes in this month 30 day review completed for Medical Director physician review and signature. Continue ITP unless changes made by physician. intermittent attendance personal reasons       Comments:

## 2016-01-14 ENCOUNTER — Encounter: Payer: Medicare Other | Admitting: *Deleted

## 2016-01-14 DIAGNOSIS — I5032 Chronic diastolic (congestive) heart failure: Secondary | ICD-10-CM | POA: Diagnosis not present

## 2016-01-14 DIAGNOSIS — Z951 Presence of aortocoronary bypass graft: Secondary | ICD-10-CM | POA: Diagnosis not present

## 2016-01-14 DIAGNOSIS — I208 Other forms of angina pectoris: Secondary | ICD-10-CM

## 2016-01-14 NOTE — Progress Notes (Signed)
Daily Session Note  Patient Details  Name: [name] [name] MRN: [id number] Date of Birth: 1933-12-06 Referring Provider:   Flowsheet Row Cardiac Rehab from 10/05/2015 in [facility] Cardiac and Pulmonary Rehab  Referring Provider  Ida Rogue MD      Encounter Date: 01/14/2016  Check In:     Session Check In - 01/14/16 1710      Check-In   Location [facility]-Cardiac & Pulmonary Rehab   Staff Present Heath Lark, RN, BSN, Laveda Norman, BS, ACSM CEP, Exercise Physiologist;Amanda Oletta Darter, [place], ACSM CEP, Exercise Physiologist   Supervising physician immediately available to respond to emergencies See telemetry face sheet for immediately available ER MD   Medication changes reported     No   Fall or balance concerns reported    No   Warm-up and Cool-down Performed on first and last piece of equipment   Resistance Training Performed Yes   VAD Patient? No     Pain Assessment   Currently in Pain? No/denies   Multiple Pain Sites No         Goals Met:  Independence with exercise equipment Exercise tolerated well No report of cardiac concerns or symptoms Strength training completed today  Goals Unmet:  Not Applicable  Comments: Pt able to follow exercise prescription today without complaint.  Will continue to monitor for progression.    Dr. Emily Filbert is Medical Director for [place] and [facility].

## 2016-01-15 LAB — GLUCOSE, CAPILLARY
GLUCOSE-CAPILLARY: 51 mg/dL — AB (ref 65–99)
GLUCOSE-CAPILLARY: 93 mg/dL (ref 65–99)

## 2016-01-21 DIAGNOSIS — I208 Other forms of angina pectoris: Secondary | ICD-10-CM

## 2016-01-21 DIAGNOSIS — I5032 Chronic diastolic (congestive) heart failure: Secondary | ICD-10-CM | POA: Diagnosis not present

## 2016-01-21 DIAGNOSIS — Z951 Presence of aortocoronary bypass graft: Secondary | ICD-10-CM | POA: Diagnosis not present

## 2016-01-21 NOTE — Progress Notes (Signed)
Daily Session Note  Patient Details  Name: [name] [name] MRN: [id number] Date of Birth: 1933-12-04 Referring Provider:   Flowsheet Row Cardiac Rehab from 10/05/2015 in [facility] Cardiac and Pulmonary Rehab  Referring Provider  Ida Rogue MD      Encounter Date: 01/21/2016  Check In:     Session Check In - 01/21/16 1709      Check-In   Location [facility]-Cardiac & Pulmonary Rehab   Staff Present Nyoka Cowden, RN, BSN, [place], BS, ACSM CEP, Exercise Physiologist;Evolet Salminen Oletta Darter, [place], ACSM CEP, Exercise Physiologist;Other   Supervising physician immediately available to respond to emergencies See telemetry face sheet for immediately available ER MD   Medication changes reported     No   Fall or balance concerns reported    No   Warm-up and Cool-down Performed on first and last piece of equipment   Resistance Training Performed Yes   VAD Patient? No     Pain Assessment   Currently in Pain? No/denies   Multiple Pain Sites No           Exercise Prescription Changes - 01/21/16 1100      Exercise Review   Progression Yes     Response to Exercise   Blood Pressure (Admit) 140/68   Blood Pressure (Exercise) 160/74   Blood Pressure (Exit) 130/72   Heart Rate (Admit) 77 bpm   Heart Rate (Exercise) 122 bpm   Heart Rate (Exit) 61 bpm   Rating of Perceived Exertion (Exercise) 12   Duration Progress to 45 minutes of aerobic exercise without signs/symptoms of physical distress   Intensity THRR unchanged     Progression   Progression Continue to progress workloads to maintain intensity without signs/symptoms of physical distress.   Average METs 2.4     Resistance Training   Training Prescription Yes   Weight 1   Reps 10-15     Interval Training   Interval Training No     Treadmill   MPH 2.5   Grade 0   Minutes 15   METs 2.91     Bike   Level 0.5   Minutes 15   METs 1.86      Goals Met:  Independence with exercise equipment Exercise tolerated well No  report of cardiac concerns or symptoms Strength training completed today  Goals Unmet:  Not Applicable  Comments: Pt able to follow exercise prescription today without complaint.  Will continue to monitor for progression.    Dr. Emily Filbert is Medical Director for [place] and [facility].

## 2016-02-01 ENCOUNTER — Encounter: Payer: Medicare Other | Admitting: *Deleted

## 2016-02-01 DIAGNOSIS — I5032 Chronic diastolic (congestive) heart failure: Secondary | ICD-10-CM | POA: Diagnosis not present

## 2016-02-01 DIAGNOSIS — I208 Other forms of angina pectoris: Secondary | ICD-10-CM

## 2016-02-01 DIAGNOSIS — Z951 Presence of aortocoronary bypass graft: Secondary | ICD-10-CM | POA: Diagnosis not present

## 2016-02-01 NOTE — Progress Notes (Signed)
Daily Session Note  Patient Details  Name: QUADE RAMIREZ MRN: [id number] Date of Birth: 08-05-1933 Referring Provider:   Flowsheet Row Cardiac Rehab from 10/05/2015 in [facility] Cardiac and Pulmonary Rehab  Referring Provider  Ida Rogue MD      Encounter Date: 02/01/2016  Check In:     Session Check In - 02/01/16 1632      Check-In   Location [facility]-Cardiac & Pulmonary Rehab   Staff Present Heath Lark, RN, BSN, CCRP;Scotlyn Mccranie, RN, Moises Blood, BS, ACSM CEP, Exercise Physiologist   Supervising physician immediately available to respond to emergencies See telemetry face sheet for immediately available ER MD   Medication changes reported     No   Fall or balance concerns reported    No   Warm-up and Cool-down Performed on first and last piece of equipment   Resistance Training Performed Yes   VAD Patient? No     Pain Assessment   Currently in Pain? No/denies         Goals Met:  Proper associated with RPD/PD & O2 Sat Exercise tolerated well No report of cardiac concerns or symptoms  Goals Unmet:  Not Applicable  Comments:     Dr. Emily Filbert is Medical Director for [place] and [facility].

## 2016-02-08 ENCOUNTER — Encounter: Payer: Medicare Other | Attending: Cardiovascular Disease

## 2016-02-08 DIAGNOSIS — I208 Other forms of angina pectoris: Secondary | ICD-10-CM

## 2016-02-08 DIAGNOSIS — I5032 Chronic diastolic (congestive) heart failure: Secondary | ICD-10-CM | POA: Diagnosis not present

## 2016-02-08 DIAGNOSIS — Z951 Presence of aortocoronary bypass graft: Secondary | ICD-10-CM | POA: Diagnosis not present

## 2016-02-08 NOTE — Progress Notes (Signed)
Daily Session Note  Patient Details  Name: [name] [name] MRN: [id number] Date of Birth: Sep 28, 1933 Referring Provider:   Flowsheet Row Cardiac Rehab from 10/05/2015 in [facility] Cardiac and Pulmonary Rehab  Referring Provider  Ida Rogue MD      Encounter Date: 02/08/2016  Check In:     Session Check In - 02/08/16 1655      Check-In   Location [facility]-Cardiac & Pulmonary Rehab   Staff Present Heath Lark, RN, BSN, CCRP;Besan Ketchem, DPT, Ronaldo Miyamoto, BS, ACSM CEP, Exercise Physiologist   Supervising physician immediately available to respond to emergencies See telemetry face sheet for immediately available ER MD   Medication changes reported     No   Fall or balance concerns reported    No   Warm-up and Cool-down Performed on first and last piece of equipment   Resistance Training Performed Yes   VAD Patient? No     Pain Assessment   Currently in Pain? No/denies   Multiple Pain Sites No         Goals Met:  Independence with exercise equipment  Goals Unmet:  Not Applicable  Comments: Patient completed exercise prescription and all exercise goals during rehab session. The exercise was tolerated well and the patient is progressing in the program.    Dr. Emily Filbert is Medical Director for [place] and [facility].

## 2016-02-10 ENCOUNTER — Encounter: Payer: Self-pay | Admitting: *Deleted

## 2016-02-10 DIAGNOSIS — I208 Other forms of angina pectoris: Secondary | ICD-10-CM

## 2016-02-10 NOTE — Progress Notes (Signed)
Cardiac Individual Treatment Plan  Patient Details  Name: [name] [name] MRN: [id number] Date of Birth: 02/18/1934 Referring Provider:   Flowsheet Row Cardiac Rehab from 10/05/2015 in [facility] Cardiac and Pulmonary Rehab  Referring Provider  Ida Rogue MD      Initial Encounter Date:  Flowsheet Row Cardiac Rehab from 10/05/2015 in [facility] Cardiac and Pulmonary Rehab  Date  10/05/15  Referring Provider  Ida Rogue MD      Visit Diagnosis: Stable angina [facility])  Patient's Home Medications on Admission:  Current Outpatient Prescriptions:  .  acetaminophen (TYLENOL) 650 MG CR tablet, Take 650 mg by mouth every 8 (eight) hours as needed for pain. For hand arthritis, Disp: , Rfl:  .  aspirin 81 MG tablet, Take 162 mg by mouth daily. , Disp: , Rfl:  .  atorvastatin (LIPITOR) 40 MG tablet, Take 1 tablet (40 mg total) by mouth every other day., Disp: 90 tablet, Rfl: 3 .  BD INSULIN SYRINGE ULTRAFINE 31G X 15/64" 0.3 ML MISC, USE AS DIRECTED, Disp: 100 each, Rfl: 3 .  Biotin 1000 MCG tablet, Take 1,000 mcg by mouth daily., Disp: , Rfl:  .  carvedilol (COREG) 12.5 MG tablet, TAKE ONE TABLET TWICE A DAY WITH MEALS., Disp: 180 tablet, Rfl: 3 .  finasteride (PROSCAR) 5 MG tablet, Take 1 tablet (5 mg total) by mouth daily., Disp: 90 tablet, Rfl: 1 .  furosemide (LASIX) 20 MG tablet, Take 1 tablet (20 mg total) by mouth 2 (two) times daily as needed. (Patient taking differently: Take 20 mg by mouth daily. ), Disp: 60 tablet, Rfl: 6 .  glimepiride (AMARYL) 1 MG tablet, TAKE 1/2 TABLET AM DAILY., Disp: , Rfl:  .  LANTUS 100 UNIT/ML injection, Inject 18 Units into the skin 2 (two) times daily. , Disp: , Rfl:  .  meloxicam (MOBIC) 15 MG tablet, Take 15 mg by mouth daily. , Disp: , Rfl:  .  omeprazole (PRILOSEC) 20 MG capsule, TAKE ONE CAPSULE TWICE A DAY, Disp: 60 capsule, Rfl: 11 .  potassium chloride (K-DUR,KLOR-CON) 10 MEQ tablet, TAKE 1 TABLET BY MOUTH DAILY, Disp: 30 tablet, Rfl: 9 .  ramipril  (ALTACE) 10 MG capsule, Take 1 capsule (10 mg total) by mouth daily., Disp: 90 capsule, Rfl: 3 .  traMADol (ULTRAM) 50 MG tablet, Take 50 mg by mouth as needed. , Disp: , Rfl:   Past Medical History: Past Medical History:  Diagnosis Date  . Cataract   . Coronary artery disease    a. 4v CABG 01/2005; b. cath 06/05/13 showed patent grafts: LIMA-LAD, VG-D, VG-OM3, VG-dRCA, nl filling pressures, nl LV fxn  . Diastolic dysfunction    a. echo 05/6142: nl systolic fxn, mild LVH, diastolic relaxation abnormality, mildly enlarged LA, mild Ao insufficiency  . ED (erectile dysfunction)   . GERD (gastroesophageal reflux disease)   . Hyperlipidemia   . Hypertension   . Hypertrophy of prostate without urinary obstruction and other lower urinary tract symptoms (LUTS)   . IDDM (insulin dependent diabetes mellitus) ([place]) 2000  . Osteoarthritis   . Perirectal fistula   . Tubular adenoma of colon 07/2012    Tobacco Use: History  Smoking Status  . Never Smoker  Smokeless Tobacco  . Never Used    Labs: Recent Review Flowsheet Data    Labs for ITP Cardiac and Pulmonary Rehab Latest Ref Rng & Units 07/30/2013 11/05/2013 07/04/2014 11/07/2014 02/06/2015   Cholestrol 0 - 200 mg/dL 112 - 172 - 111   LDLCALC 0 -  99 mg/dL 61 - 110(H) - 61   HDL >39.00 mg/dL 39(L) - 39(L) - 31.90(L)   Trlycerides 0.0 - 149.0 mg/dL 59 - 115 - 92.0   Hemoglobin A1c 4.0 - 6.0 % - 6.5(A) - 7.0(A) -   PHART 7.350 - 7.450 - - - - -   PCO2ART 35.0 - 45.0 mmHg - - - - -   HCO3 20.0 - 24.0 mEq/L - - - - -   TCO2 0 - 100 mmol/L - - - - -   O2SAT % - - - - -       Exercise Target Goals:    Exercise Program Goal: Individual exercise prescription set with THRR, safety & activity barriers. Participant demonstrates ability to understand and report RPE using BORG scale, to self-measure pulse accurately, and to acknowledge the importance of the exercise prescription.  Exercise Prescription Goal: Starting with aerobic activity 30 plus  minutes a day, 3 days per week for initial exercise prescription. Provide home exercise prescription and guidelines that participant acknowledges understanding prior to discharge.  Activity Barriers & Risk Stratification:     Activity Barriers & Cardiac Risk Stratification - 10/05/15 1526      Activity Barriers & Cardiac Risk Stratification   Activity Barriers --  Has had pain arounf edge of rib cage and towards his back since Jan this year.  Been to several doctors without a diagnosis. Last 2 weeks had PT with stretching and accupuncture with some relief. Has Pain clinic appt 8/1 in [facility]      6 Minute Walk:     [place] Name 10/05/15 1501         6 Minute Walk   Phase Initial     Distance 1165 feet     Walk Time 6 minutes     # of Rest Breaks 0     MPH 2.21     METS 2.08     RPE 12     VO2 Peak 7.28     Symptoms No     Resting HR 64 bpm     Resting BP 126/70     Max Ex. HR 87 bpm     Max Ex. BP 146/70     2 Minute Post BP 134/70        Initial Exercise Prescription:     Initial Exercise Prescription - 10/05/15 1500      Date of Initial Exercise RX and Referring Provider   Date 10/05/15   Referring Provider Ida Rogue MD     Treadmill   MPH 1.4   Grade 0   Minutes 15   METs 2.07     Recumbant Elliptical   Level 1   RPM 50   Minutes 15   METs 2     T5 Nustep   Level 1   Minutes 15   METs 2     Prescription Details   Frequency (times per week) 3     Intensity   THRR 40-80% of Max Heartrate 95-126   Ratings of Perceived Exertion 11-15   Perceived Dyspnea 0-4     Progression   Progression Continue to progress workloads to maintain intensity without signs/symptoms of physical distress.     Resistance Training   Training Prescription Yes   Weight 2 lbs   Reps 10-15      Perform Capillary Blood Glucose checks as needed.  Exercise Prescription Changes:     Exercise Prescription  Changes    Row Name 10/05/15 1300  12/24/15 1200 01/06/16 1200 01/21/16 1100 02/03/16 1400     Exercise Review   Progression  -  - Yes Yes  -     Response to Exercise   Blood Pressure (Admit) 126/70 168/80  - 140/68 170/80   Blood Pressure (Exercise) 146/70 138/78 186/70 160/74 140/80   Blood Pressure (Exit) 134/70 132/72 124/72 130/72 118/60   Heart Rate (Admit) 62 bpm 58 bpm 79 bpm 77 bpm 64 bpm   Heart Rate (Exercise) 98 bpm 92 bpm 103 bpm 122 bpm 107 bpm   Heart Rate (Exit) 64 bpm 66 bpm 79 bpm 61 bpm 77 bpm   Rating of Perceived Exertion (Exercise) 12 12 11 12 12    Duration  - Progress to 45 minutes of aerobic exercise without signs/symptoms of physical distress Progress to 45 minutes of aerobic exercise without signs/symptoms of physical distress Progress to 45 minutes of aerobic exercise without signs/symptoms of physical distress Progress to 45 minutes of aerobic exercise without signs/symptoms of physical distress   Intensity  - THRR unchanged THRR unchanged THRR unchanged THRR unchanged     Progression   Progression  -  - Continue to progress workloads to maintain intensity without signs/symptoms of physical distress. Continue to progress workloads to maintain intensity without signs/symptoms of physical distress. Continue to progress workloads to maintain intensity without signs/symptoms of physical distress.   Average METs  -  - 1.97 2.4 3.4     Resistance Training   Training Prescription  - Yes Yes Yes Yes   Weight  - 2 1 1 3    Reps  - 10-12 10-15 10-15  -     Interval Training   Interval Training  - No No No  -     Treadmill   MPH  - 1.4 1.4 2.5 2.5   Grade  - 0 0 0 0   Minutes  - 15 15 15 15    METs  - 2.07 2.07 2.91 2.91     Bike   Level  -  -  - 0.5  -   Minutes  -  -  - 15  -   METs  -  -  - 1.86  -     Recumbant Elliptical   Level  - 1  -  -  -   Minutes  - 15  -  -  -     T5 Nustep   Level  -  -  -  - 1   Minutes  -  -  -  - 15   METs  -  -  -  - 3.9      Exercise Comments:      Exercise Comments    Row Name 12/14/15 1828 12/24/15 1212 01/06/16 1254 01/21/16 1147 02/03/16 1409   Exercise Comments First full day of exercise! [name] was oriented to gym and equipment including functions, settings, policies, and procedures. [name]'s individual exercise prescription and treatment plan were reviewed.  All starting workloads were established based on the results of the 6 minute walk test done at initial orientation visit.  The plan for exercise progression was also introduced and progression will be customized based on patient's performance and goals. Mr Don is progressing well with exercise. Mr Pinzon is doing well with exercise. Mr Abboud is doing well with exercise. [name] is progressing well with exercise.      Discharge Exercise Prescription (Final Exercise Prescription Changes):  Exercise Prescription Changes - 02/03/16 1400      Response to Exercise   Blood Pressure (Admit) 170/80   Blood Pressure (Exercise) 140/80   Blood Pressure (Exit) 118/60   Heart Rate (Admit) 64 bpm   Heart Rate (Exercise) 107 bpm   Heart Rate (Exit) 77 bpm   Rating of Perceived Exertion (Exercise) 12   Duration Progress to 45 minutes of aerobic exercise without signs/symptoms of physical distress   Intensity THRR unchanged     Progression   Progression Continue to progress workloads to maintain intensity without signs/symptoms of physical distress.   Average METs 3.4     Resistance Training   Training Prescription Yes   Weight 3     Treadmill   MPH 2.5   Grade 0   Minutes 15   METs 2.91     T5 Nustep   Level 1   Minutes 15   METs 3.9      Nutrition:  Target Goals: Understanding of nutrition guidelines, daily intake of sodium '1500mg'$ , cholesterol '200mg'$ , calories 30% from fat and 7% or less from saturated fats, daily to have 5 or more servings of fruits and vegetables.  Biometrics:     Pre Biometrics - 10/05/15 1512      Pre Biometrics   Height 5' 11.8" (1.824  m)   Weight 216 lb 11.2 oz (98.3 kg)   Waist Circumference 45.25 inches   Hip Circumference 43 inches   Waist to Hip Ratio 1.05 %   BMI (Calculated) 29.6   Single Leg Stand 9.2 seconds       Nutrition Therapy Plan and Nutrition Goals:     Nutrition Therapy & Goals - 01/04/16 1342      Nutrition Therapy   Diet Diabetes diet (takes 2 insulin injections a day)    Drug/Food Interactions Statins/Certain Fruits     Personal Nutrition Goals   Comments "[name]" said he knows what to eat.       Nutrition Discharge: Rate Your Plate Scores:     Nutrition Assessments - 10/23/15 1428      Rate Your Plate Scores   Pre Score 58   Pre Score % 64 %      Nutrition Goals Re-Evaluation:     Nutrition Goals Re-Evaluation    Row Name 01/04/16 1352             Personal Goal #1 Re-Evaluation   Personal Goal #1 Heart healthy diabetic diet       Goal Progress Seen Yes       Comments "[name]" Tom said he would prefer not to meet individually with the Cardiac Rehab Registered Dietician. [name] said "I am 82 and set in my ways somedays but my blood sugar this am was 103 and yesterday 133.          Intervention Plan   Intervention Continue to educate, counsel and set short/long term goals regarding individualized specific personal dietary modifications.          Psychosocial: Target Goals: Acknowledge presence or absence of depression, maximize coping skills, provide positive support system. Participant is able to verbalize types and ability to use techniques and skills needed for reducing stress and depression.  Initial Review & Psychosocial Screening:     Initial Psych Review & Screening - 10/05/15 Louise? Yes     Barriers   Psychosocial barriers to participate in program The patient should benefit from  training in stress management and relaxation.;There are no identifiable barriers or psychosocial needs.     Screening Interventions    Interventions Encouraged to exercise      Quality of Life Scores:     Quality of Life - 10/05/15 1521      Quality of Life Scores   Health/Function Pre 24.86 %   Socioeconomic Pre 30 %   Psych/Spiritual Pre 30 %   Family Pre 25.2 %   GLOBAL Pre 27.18 %      PHQ-9: Recent Review Flowsheet Data    Depression screen [facility] 2/9 10/05/2015 02/06/2015 02/05/2014   Decreased Interest 0 0 0   Down, Depressed, Hopeless 0 0 0   PHQ - 2 Score 0 0 0   Altered sleeping 1 - -   Tired, decreased energy 2 - -   Change in appetite 2 - -   Feeling bad or failure about yourself  0 - -   Trouble concentrating 0 - -   Moving slowly or fidgety/restless 0 - -   Suicidal thoughts 0 - -   PHQ-9 Score 5 - -   Difficult doing work/chores Not difficult at all - -      Psychosocial Evaluation and Intervention:     Psychosocial Evaluation - 02/01/16 1733      Psychosocial Evaluation & Interventions   Comments Counselor met with Mr. Wilms [facility]) today for initial psychosocial evaluation.  He is an 82 yr old who was in this program 11 years ago when he had 5 by-passes done.  He has blockages now and reports Drs are unable to do stents so ordered this program again.  [name] has a strong support system with a spouse of (6) years; an adult child close by and active involvement in his local church.  He has arthritis and type 2 diabetes which has been a challenge for him lately as his Dr. Ricard Dillon his insulin prescription 2 months ago and he has had more ups and downs with his sugar levels.  He also reports he is not sleeping great over the past year with waking up and difficulty getting back to sleep.  He is tired as a result and has decreased appetite as well.  [name] denies a history of depression or anxiety and states he is typically in a positive mood.  He has goals to "increase circulation and open up those blockages" with exercise.  He would like to return to riding his bike outside 30-60 mins 3-5X per week once he  completes this program.   Staff will be following with [name] throughout the course of this program.     Continued Psychosocial Services Needed Yes  [name] may need to be seen by a medical provider for his insulin prescription and his sleep challenges which are impacting his overall health.  Staff will follow      Psychosocial Re-Evaluation:     Psychosocial Re-Evaluation    [place] Name 01/04/16 1359             Psychosocial Re-Evaluation   Interventions Encouraged to attend Cardiac Rehabilitation for the exercise       Comments "[name]" Shirline Frees said "I try not to get stressed too much". We discussed how riding his bicycle in his neighborhood is good for exercise and to decrease any stress.           Vocational Rehabilitation: Provide vocational rehab assistance to qualifying candidates.   Vocational Rehab Evaluation & Intervention:  Vocational Rehab - 10/05/15 1528      Initial Vocational Rehab Evaluation & Intervention   Assessment shows need for Vocational Rehabilitation No      Education: Education Goals: Education classes will be provided on a weekly basis, covering required topics. Participant will state understanding/return demonstration of topics presented.  Learning Barriers/Preferences:     Learning Barriers/Preferences - 10/05/15 1527      Learning Barriers/Preferences   Learning Barriers None   Learning Preferences None      Education Topics: General Nutrition Guidelines/Fats and Fiber: -Group instruction provided by verbal, written material, models and posters to present the general guidelines for heart healthy nutrition. Gives an explanation and review of dietary fats and fiber.   Controlling Sodium/Reading Food Labels: -Group verbal and written material supporting the discussion of sodium use in heart healthy nutrition. Review and explanation with models, verbal and written materials for utilization of the food label.   Exercise Physiology & Risk  Factors: - Group verbal and written instruction with models to review the exercise physiology of the cardiovascular system and associated critical values. Details cardiovascular disease risk factors and the goals associated with each risk factor. Flowsheet Row Cardiac Rehab from 02/08/2016 in [facility] Cardiac and Pulmonary Rehab  Date  01/13/16  Educator  AS  Instruction Review Code  2- meets goals/outcomes      Aerobic Exercise & Resistance Training: - Gives group verbal and written discussion on the health impact of inactivity. On the components of aerobic and resistive training programs and the benefits of this training and how to safely progress through these programs. Flowsheet Row Cardiac Rehab from 02/08/2016 in [facility] Cardiac and Pulmonary Rehab  Date  02/01/16  Educator  K. Amedeo Plenty, RN  Instruction Review Code  2- meets goals/outcomes      Flexibility, Balance, General Exercise Guidelines: - Provides group verbal and written instruction on the benefits of flexibility and balance training programs. Provides general exercise guidelines with specific guidelines to those with heart or lung disease. Demonstration and skill practice provided. Flowsheet Row Cardiac Rehab from 02/08/2016 in [facility] Cardiac and Pulmonary Rehab  Date  12/14/15  Educator  [facility]  Instruction Review Code  2- meets goals/outcomes      Stress Management: - Provides group verbal and written instruction about the health risks of elevated stress, cause of high stress, and healthy ways to reduce stress.   Depression: - Provides group verbal and written instruction on the correlation between heart/lung disease and depressed mood, treatment options, and the stigmas associated with seeking treatment.   Anatomy & Physiology of the Heart: - Group verbal and written instruction and models provide basic cardiac anatomy and physiology, with the coronary electrical and arterial systems. Review of: AMI, Angina, Valve disease, Heart  Failure, Cardiac Arrhythmia, Pacemakers, and the ICD. Flowsheet Row Cardiac Rehab from 02/08/2016 in [facility] Cardiac and Pulmonary Rehab  Date  02/08/16  Educator  SB  Instruction Review Code  2- meets goals/outcomes      Cardiac Procedures: - Group verbal and written instruction and models to describe the testing methods done to diagnose heart disease. Reviews the outcomes of the test results. Describes the treatment choices: Medical Management, Angioplasty, or Coronary Bypass Surgery.   Cardiac Medications: - Group verbal and written instruction to review commonly prescribed medications for heart disease. Reviews the medication, class of the drug, and side effects. Includes the steps to properly store meds and maintain the prescription regimen.   Go Sex-Intimacy & Heart Disease, Get  SMART - Goal Setting: - Group verbal and written instruction through game format to discuss heart disease and the return to sexual intimacy. Provides group verbal and written material to discuss and apply goal setting through the application of the S.M.A.R.T. Method.   Other Matters of the Heart: - Provides group verbal, written materials and models to describe Heart Failure, Angina, Valve Disease, and Diabetes in the realm of heart disease. Includes description of the disease process and treatment options available to the cardiac patient. Flowsheet Row Cardiac Rehab from 02/08/2016 in [facility] Cardiac and Pulmonary Rehab  Date  02/08/16  Educator  SB  Instruction Review Code  2- meets goals/outcomes      Exercise & Equipment Safety: - Individual verbal instruction and demonstration of equipment use and safety with use of the equipment. Flowsheet Row Cardiac Rehab from 02/08/2016 in [facility] Cardiac and Pulmonary Rehab  Date  10/05/15  Educator  SB  Instruction Review Code  2- meets goals/outcomes      Infection Prevention: - Provides verbal and written material to individual with discussion of infection  control including proper hand washing and proper equipment cleaning during exercise session. Flowsheet Row Cardiac Rehab from 02/08/2016 in [facility] Cardiac and Pulmonary Rehab  Date  10/05/15  Educator  SB  Instruction Review Code  2- meets goals/outcomes      Falls Prevention: - Provides verbal and written material to individual with discussion of falls prevention and safety.   Diabetes: - Individual verbal and written instruction to review signs/symptoms of diabetes, desired ranges of glucose level fasting, after meals and with exercise. Advice that pre and post exercise glucose checks will be done for 3 sessions at entry of program. Flowsheet Row Cardiac Rehab from 02/08/2016 in [facility] Cardiac and Pulmonary Rehab  Date  10/05/15  Educator  SB  Instruction Review Code  2- meets goals/outcomes       Knowledge Questionnaire Score:     Knowledge Questionnaire Score - 10/05/15 1527      Knowledge Questionnaire Score   Pre Score 22/28      Core Components/Risk Factors/Patient Goals at Admission:     Personal Goals and Risk Factors at Admission - 10/05/15 1509      Core Components/Risk Factors/Patient Goals on Admission    Weight Management Yes;Weight Loss;Obesity   Intervention Weight Management: Develop a combined nutrition and exercise program designed to reach desired caloric intake, while maintaining appropriate intake of nutrient and fiber, sodium and fats, and appropriate energy expenditure required for the weight goal.;Weight Management: Provide education and appropriate resources to help participant work on and attain dietary goals.;Weight Management/Obesity: Establish reasonable short term and long term weight goals.;Obesity: Provide education and appropriate resources to help participant work on and attain dietary goals.   Admit Weight 216 lb 11.2 oz (98.3 kg)   Goal Weight: Short Term 210 lb (95.3 kg)   Goal Weight: Long Term 200 lb (90.7 kg)   Expected Outcomes Long Term:  Adherence to nutrition and physical activity/exercise program aimed toward attainment of established weight goal;Short Term: Continue to assess and modify interventions until short term weight is achieved;Weight Loss: Understanding of general recommendations for a balanced deficit meal plan, which promotes 1-2 lb weight loss per week and includes a negative energy balance of [phone] kcal/d;Understanding recommendations for meals to include 15-35% energy as protein, 25-35% energy from fat, 35-60% energy from carbohydrates, less than 225m of dietary cholesterol, 20-35 gm of total fiber daily;Understanding of distribution of calorie intake throughout the  day with the consumption of 4-5 meals/snacks   Increase Strength and Stamina Yes   Intervention Provide advice, education, support and counseling about physical activity/exercise needs.;Develop an individualized exercise prescription for aerobic and resistive training based on initial evaluation findings, risk stratification, comorbidities and participant's personal goals.   Expected Outcomes Achievement of increased cardiorespiratory fitness and enhanced flexibility, muscular endurance and strength shown through measurements of functional capacity and personal statement of participant.   Diabetes Yes   Intervention Provide education about signs/symptoms and action to take for hypo/hyperglycemia.;Provide education about proper nutrition, including hydration, and aerobic/resistive exercise prescription along with prescribed medications to achieve blood glucose in normal ranges: Fasting glucose 65-99 mg/dL   Expected Outcomes Short Term: Participant verbalizes understanding of the signs/symptoms and immediate care of hyper/hypoglycemia, proper foot care and importance of medication, aerobic/resistive exercise and nutrition plan for blood glucose control.;Long Term: Attainment of HbA1C < 7%.   Heart Failure Yes   Intervention Provide a combined exercise and  nutrition program that is supplemented with education, support and counseling about heart failure. Directed toward relieving symptoms such as shortness of breath, decreased exercise tolerance, and extremity edema.   Expected Outcomes Improve functional capacity of life;Short term: Attendance in program 2-3 days a week with increased exercise capacity. Reported lower sodium intake. Reported increased fruit and vegetable intake. Reports medication compliance.;Short term: Daily weights obtained and reported for increase. Utilizing diuretic protocols set by physician.;Long term: Adoption of self-care skills and reduction of barriers for early signs and symptoms recognition and intervention leading to self-care maintenance.   Hypertension Yes   Intervention Provide education on lifestyle modifcations including regular physical activity/exercise, weight management, moderate sodium restriction and increased consumption of fresh fruit, vegetables, and low fat dairy, alcohol moderation, and smoking cessation.;Monitor prescription use compliance.   Expected Outcomes Short Term: Continued assessment and intervention until BP is < 140/10m HG in hypertensive participants. < 130/829mHG in hypertensive participants with diabetes, heart failure or chronic kidney disease.;Long Term: Maintenance of blood pressure at goal levels.   Lipids Yes   Intervention Provide education and support for participant on nutrition & aerobic/resistive exercise along with prescribed medications to achieve LDL <7090mHDL >17m20m Expected Outcomes Short Term: Participant states understanding of desired cholesterol values and is compliant with medications prescribed. Participant is following exercise prescription and nutrition guidelines.;Long Term: Cholesterol controlled with medications as prescribed, with individualized exercise RX and with personalized nutrition plan. Value goals: LDL < 70mg59mL > 40 mg.   Personal Goal Other Yes    Personal Goal Breathe better during exercise   Intervention Provide education and exercise prescription and include tips for pursed lip breathing   Expected Outcomes Able to feel less short of breath with exercise.      Core Components/Risk Factors/Patient Goals Review:      Goals and Risk Factor Review    Row Name 01/04/16 1353 01/21/16 1710           Core Components/Risk Factors/Patient Goals Review   Personal Goals Review Weight Management/Obesity;Increase Strength and Stamina;Lipids;Hypertension;Diabetes Weight Management/Obesity;Diabetes;Sedentary      Review "[name]" said he tries to ride his bicycle in his neighborhood the days he is not attending Cardiac Rehab. He said he still "gets out of air but realizes that the MD told him there are some clogged heart arteries that they can't totally fix". He said his blood pressure has been up lately and he is going to get himself a new blood pressure cufff. [name]  admits to having a history of back pain and had a injection in his knee last month that increased his blood sugars but he reports his HBA1c is usually about 6.9. [name] said he is not regular enough with his attendance with his Cardiac rehab to get his stamina back but he hopes to do better. I offered the 8am Tuesday /Thursday Cardiac REhab for a make up day but [name] said he is not a morning person. He is also having company from [place] this evening but he hopes to attend Cardiac Rehab this evening. [name] thanked me for my phone call to him to check up with him on an individual basis.  [name] has been having high and low blood sugar episodes.  He has called his Dr but has not yet received a call back.  He was instructed to call again.  His insulin was changed a few weeks ago.  HbA1C was 7.0 at last check.  His weight is down and he has been eating less and riding his bike outdoors most days at home.  he would like to meet with the RD.      Expected Outcomes Cont [place] healthy lifestyle. Increased  stamina. Better control of his blood pressure (please see telemetry sheets for his blood pressure).  [name] will get his BG under better control with the help of his Dr. and learning better dietary habits.         Core Components/Risk Factors/Patient Goals at Discharge (Final Review):      Goals and Risk Factor Review - 01/21/16 1710      Core Components/Risk Factors/Patient Goals Review   Personal Goals Review Weight Management/Obesity;Diabetes;Sedentary   Review [name] has been having high and low blood sugar episodes.  He has called his Dr but has not yet received a call back.  He was instructed to call again.  His insulin was changed a few weeks ago.  HbA1C was 7.0 at last check.  His weight is down and he has been eating less and riding his bike outdoors most days at home.  he would like to meet with the RD.   Expected Outcomes [name] will get his BG under better control with the help of his Dr. and learning better dietary habits.      ITP Comments:     ITP Comments    Row Name 10/05/15 1519 12/01/15 1537 12/16/15 0659 01/13/16 0628 01/14/16 1753   ITP Comments Initial visit completed today ITP created.  Continue with ITP ITP ready for initial signature.  Documentation of diagnosis found in Office Note 11/23/2015 [facility] 30 day review. Continue with ITP unless changes noted by Medical Director at signature of review. Has started classes in this month 30 day review completed for Medical Director physician review and signature. Continue ITP unless changes made by physician. intermittent attendance personal reasons After exercise Mike was not feeling well and his blood sugar dropped to 51 mg/dL. He was given 4 glucose tabs and his blood sugar went up to 93 mg/dL. He ate PB crackers before leaving to go home and have dinner.    [place] Name 02/10/16 0626           ITP Comments 30 day review completed for review by Dr Emily Filbert.  Continue with ITP unless changes noted by Dr Sabra Heck.           Comments:

## 2016-02-15 ENCOUNTER — Encounter: Payer: Medicare Other | Admitting: *Deleted

## 2016-02-15 DIAGNOSIS — I208 Other forms of angina pectoris: Secondary | ICD-10-CM

## 2016-02-15 DIAGNOSIS — I5032 Chronic diastolic (congestive) heart failure: Secondary | ICD-10-CM | POA: Diagnosis not present

## 2016-02-15 DIAGNOSIS — Z951 Presence of aortocoronary bypass graft: Secondary | ICD-10-CM | POA: Diagnosis not present

## 2016-02-15 NOTE — Progress Notes (Signed)
Daily Session Note  Patient Details  Name: [name] [name] MRN: [id number] Date of Birth: Jan 23, 1934 Referring Provider:   Flowsheet Row Cardiac Rehab from 10/05/2015 in [facility]  Referring Provider  Ida Rogue MD      Encounter Date: 02/15/2016  Check In:     Session Check In - 02/15/16 1631      Check-In   Location [facility]   Staff Present Gerlene Burdock, RN, BSN;Susanne Bice, RN, BSN, Laveda Norman, BS, ACSM CEP, Exercise Physiologist   Supervising physician immediately available to respond to emergencies See telemetry face sheet for immediately available ER MD   Medication changes reported     No   Fall or balance concerns reported    No   Warm-up and Cool-down Performed on first and last piece of equipment   Resistance Training Performed Yes   VAD Patient? No     Pain Assessment   Currently in Pain? No/denies   Multiple Pain Sites No         Goals Met:  Independence with exercise equipment Exercise tolerated well No report of cardiac concerns or symptoms Strength training completed today  Goals Unmet:  Not Applicable  Comments: Pt able to follow exercise prescription today without complaint.  Will continue to monitor for progression.    Dr. Emily Filbert is Medical Director for [place] [facility].

## 2016-02-17 DIAGNOSIS — R05 Cough: Secondary | ICD-10-CM | POA: Diagnosis not present

## 2016-02-17 DIAGNOSIS — R0781 Pleurodynia: Secondary | ICD-10-CM | POA: Diagnosis not present

## 2016-03-04 ENCOUNTER — Telehealth: Payer: Self-pay | Admitting: *Deleted

## 2016-03-04 ENCOUNTER — Encounter: Payer: Self-pay | Admitting: *Deleted

## 2016-03-04 DIAGNOSIS — I208 Other forms of angina pectoris: Secondary | ICD-10-CM

## 2016-03-04 DIAGNOSIS — J209 Acute bronchitis, unspecified: Secondary | ICD-10-CM | POA: Diagnosis not present

## 2016-03-04 NOTE — Telephone Encounter (Signed)
Called to check on [name] [name].  He has been out with cold symptoms and hacking cough for two weeks.  He is planning to call the doctor about it again.

## 2016-03-08 ENCOUNTER — Encounter: Payer: Self-pay | Admitting: *Deleted

## 2016-03-08 DIAGNOSIS — I208 Other forms of angina pectoris: Secondary | ICD-10-CM

## 2016-03-08 NOTE — Progress Notes (Signed)
Cardiac Individual Treatment Plan  Patient Details  Name: [name] [name] MRN: [id number] Date of Birth: 06-20-1933 Referring Provider:   Flowsheet Row Cardiac Rehab from 10/05/2015 in [facility] Cardiac and Pulmonary Rehab  Referring Provider  Ida Rogue MD      Initial Encounter Date:  Flowsheet Row Cardiac Rehab from 10/05/2015 in [facility] Cardiac and Pulmonary Rehab  Date  10/05/15  Referring Provider  Ida Rogue MD      Visit Diagnosis: Stable angina [facility])  Patient's Home Medications on Admission:  Current Outpatient Prescriptions:  .  acetaminophen (TYLENOL) 650 MG CR tablet, Take 650 mg by mouth every 8 (eight) hours as needed for pain. For hand arthritis, Disp: , Rfl:  .  aspirin 81 MG tablet, Take 162 mg by mouth daily. , Disp: , Rfl:  .  atorvastatin (LIPITOR) 40 MG tablet, Take 1 tablet (40 mg total) by mouth every other day., Disp: 90 tablet, Rfl: 3 .  BD INSULIN SYRINGE ULTRAFINE 31G X 15/64" 0.3 ML MISC, USE AS DIRECTED, Disp: 100 each, Rfl: 3 .  Biotin 1000 MCG tablet, Take 1,000 mcg by mouth daily., Disp: , Rfl:  .  carvedilol (COREG) 12.5 MG tablet, TAKE ONE TABLET TWICE A DAY WITH MEALS., Disp: 180 tablet, Rfl: 3 .  finasteride (PROSCAR) 5 MG tablet, Take 1 tablet (5 mg total) by mouth daily., Disp: 90 tablet, Rfl: 1 .  furosemide (LASIX) 20 MG tablet, Take 1 tablet (20 mg total) by mouth 2 (two) times daily as needed. (Patient taking differently: Take 20 mg by mouth daily. ), Disp: 60 tablet, Rfl: 6 .  glimepiride (AMARYL) 1 MG tablet, TAKE 1/2 TABLET AM DAILY., Disp: , Rfl:  .  LANTUS 100 UNIT/ML injection, Inject 18 Units into the skin 2 (two) times daily. , Disp: , Rfl:  .  meloxicam (MOBIC) 15 MG tablet, Take 15 mg by mouth daily. , Disp: , Rfl:  .  omeprazole (PRILOSEC) 20 MG capsule, TAKE ONE CAPSULE TWICE A DAY, Disp: 60 capsule, Rfl: 11 .  potassium chloride (K-DUR,KLOR-CON) 10 MEQ tablet, TAKE 1 TABLET BY MOUTH DAILY, Disp: 30 tablet, Rfl: 9 .  ramipril  (ALTACE) 10 MG capsule, Take 1 capsule (10 mg total) by mouth daily., Disp: 90 capsule, Rfl: 3 .  traMADol (ULTRAM) 50 MG tablet, Take 50 mg by mouth as needed. , Disp: , Rfl:   Past Medical History: Past Medical History:  Diagnosis Date  . Cataract   . Coronary artery disease    a. 4v CABG 01/2005; b. cath 06/05/13 showed patent grafts: LIMA-LAD, VG-D, VG-OM3, VG-dRCA, nl filling pressures, nl LV fxn  . Diastolic dysfunction    a. echo 06/979: nl systolic fxn, mild LVH, diastolic relaxation abnormality, mildly enlarged LA, mild Ao insufficiency  . ED (erectile dysfunction)   . GERD (gastroesophageal reflux disease)   . Hyperlipidemia   . Hypertension   . Hypertrophy of prostate without urinary obstruction and other lower urinary tract symptoms (LUTS)   . IDDM (insulin dependent diabetes mellitus) ([place]) 2000  . Osteoarthritis   . Perirectal fistula   . Tubular adenoma of colon 07/2012    Tobacco Use: History  Smoking Status  . Never Smoker  Smokeless Tobacco  . Never Used    Labs: Recent Review Flowsheet Data    Labs for ITP Cardiac and Pulmonary Rehab Latest Ref Rng & Units 07/30/2013 11/05/2013 07/04/2014 11/07/2014 02/06/2015   Cholestrol 0 - 200 mg/dL 112 - 172 - 111   LDLCALC 0 -  99 mg/dL 61 - 110(H) - 61   HDL >39.00 mg/dL 39(L) - 39(L) - 31.90(L)   Trlycerides 0.0 - 149.0 mg/dL 59 - 115 - 92.0   Hemoglobin A1c 4.0 - 6.0 % - 6.5(A) - 7.0(A) -   PHART 7.350 - 7.450 - - - - -   PCO2ART 35.0 - 45.0 mmHg - - - - -   HCO3 20.0 - 24.0 mEq/L - - - - -   TCO2 0 - 100 mmol/L - - - - -   O2SAT % - - - - -       Exercise Target Goals:    Exercise Program Goal: Individual exercise prescription set with THRR, safety & activity barriers. Participant demonstrates ability to understand and report RPE using BORG scale, to self-measure pulse accurately, and to acknowledge the importance of the exercise prescription.  Exercise Prescription Goal: Starting with aerobic activity 30 plus  minutes a day, 3 days per week for initial exercise prescription. Provide home exercise prescription and guidelines that participant acknowledges understanding prior to discharge.  Activity Barriers & Risk Stratification:     Activity Barriers & Cardiac Risk Stratification - 10/05/15 1526      Activity Barriers & Cardiac Risk Stratification   Activity Barriers --  Has had pain arounf edge of rib cage and towards his back since Jan this year.  Been to several doctors without a diagnosis. Last 2 weeks had PT with stretching and accupuncture with some relief. Has Pain clinic appt 8/1 in [facility]      6 Minute Walk:     [place] Name 10/05/15 1501         6 Minute Walk   Phase Initial     Distance 1165 feet     Walk Time 6 minutes     # of Rest Breaks 0     MPH 2.21     METS 2.08     RPE 12     VO2 Peak 7.28     Symptoms No     Resting HR 64 bpm     Resting BP 126/70     Max Ex. HR 87 bpm     Max Ex. BP 146/70     2 Minute Post BP 134/70        Initial Exercise Prescription:     Initial Exercise Prescription - 10/05/15 1500      Date of Initial Exercise RX and Referring Provider   Date 10/05/15   Referring Provider Ida Rogue MD     Treadmill   MPH 1.4   Grade 0   Minutes 15   METs 2.07     Recumbant Elliptical   Level 1   RPM 50   Minutes 15   METs 2     T5 Nustep   Level 1   Minutes 15   METs 2     Prescription Details   Frequency (times per week) 3     Intensity   THRR 40-80% of Max Heartrate 95-126   Ratings of Perceived Exertion 11-15   Perceived Dyspnea 0-4     Progression   Progression Continue to progress workloads to maintain intensity without signs/symptoms of physical distress.     Resistance Training   Training Prescription Yes   Weight 2 lbs   Reps 10-15      Perform Capillary Blood Glucose checks as needed.  Exercise Prescription Changes:     Exercise Prescription  Changes    Row Name 10/05/15 1300  12/24/15 1200 01/06/16 1200 01/21/16 1100 02/03/16 1400     Exercise Review   Progression  -  - Yes Yes  -     Response to Exercise   Blood Pressure (Admit) 126/70 168/80  - 140/68 170/80   Blood Pressure (Exercise) 146/70 138/78 186/70 160/74 140/80   Blood Pressure (Exit) 134/70 132/72 124/72 130/72 118/60   Heart Rate (Admit) 62 bpm 58 bpm 79 bpm 77 bpm 64 bpm   Heart Rate (Exercise) 98 bpm 92 bpm 103 bpm 122 bpm 107 bpm   Heart Rate (Exit) 64 bpm 66 bpm 79 bpm 61 bpm 77 bpm   Rating of Perceived Exertion (Exercise) 12 12 11 12 12    Duration  - Progress to 45 minutes of aerobic exercise without signs/symptoms of physical distress Progress to 45 minutes of aerobic exercise without signs/symptoms of physical distress Progress to 45 minutes of aerobic exercise without signs/symptoms of physical distress Progress to 45 minutes of aerobic exercise without signs/symptoms of physical distress   Intensity  - THRR unchanged THRR unchanged THRR unchanged THRR unchanged     Progression   Progression  -  - Continue to progress workloads to maintain intensity without signs/symptoms of physical distress. Continue to progress workloads to maintain intensity without signs/symptoms of physical distress. Continue to progress workloads to maintain intensity without signs/symptoms of physical distress.   Average METs  -  - 1.97 2.4 3.4     Resistance Training   Training Prescription  - Yes Yes Yes Yes   Weight  - 2 1 1 3    Reps  - 10-12 10-15 10-15  -     Interval Training   Interval Training  - No No No  -     Treadmill   MPH  - 1.4 1.4 2.5 2.5   Grade  - 0 0 0 0   Minutes  - 15 15 15 15    METs  - 2.07 2.07 2.91 2.91     Bike   Level  -  -  - 0.5  -   Minutes  -  -  - 15  -   METs  -  -  - 1.86  -     Recumbant Elliptical   Level  - 1  -  -  -   Minutes  - 15  -  -  -     T5 Nustep   Level  -  -  -  - 1   Minutes  -  -  -  - 15   METs  -  -  -  - 3.9   Row Name 02/18/16 1000              Exercise Review   Progression Yes         Response to Exercise   Blood Pressure (Admit) 130/62       Blood Pressure (Exercise) 148/82       Blood Pressure (Exit) 132/70       Heart Rate (Admit) 75 bpm       Heart Rate (Exercise) 85 bpm       Heart Rate (Exit) 72 bpm       Rating of Perceived Exertion (Exercise) 11       Duration Progress to 45 minutes of aerobic exercise without signs/symptoms of physical distress       Intensity THRR unchanged  Progression   Progression Continue to progress workloads to maintain intensity without signs/symptoms of physical distress.       Average METs 2.13         Resistance Training   Training Prescription Yes       Weight 3       Reps 10-15         Interval Training   Interval Training No         Bike   Level 0.5       Minutes 15       METs 1.86         T5 Nustep   Level 1       Minutes 15       METs 2.4          Exercise Comments:     Exercise Comments    Row Name 12/14/15 1828 12/24/15 1212 01/06/16 1254 01/21/16 1147 02/03/16 1409   Exercise Comments First full day of exercise! [name] [name] was oriented to gym and equipment including functions, settings, policies, and procedures. [name]'s individual exercise prescription and treatment plan were reviewed.  All starting workloads were established based on the results of the 6 minute walk test done at initial orientation visit.  The plan for exercise progression was also introduced and progression will be customized based on patient's performance and goals. [name] [name] is progressing well with exercise. [name] [name] is doing well with exercise. [name] [name] is doing well with exercise. [name] [name] is progressing well with exercise.   [place] Name 02/18/16 1058           Exercise Comments [name] [name] has done well with exercise and staff will work with him to increase RPE levels.          Discharge Exercise Prescription (Final Exercise Prescription Changes):     Exercise Prescription Changes -  02/18/16 1000      Exercise Review   Progression Yes     Response to Exercise   Blood Pressure (Admit) 130/62   Blood Pressure (Exercise) 148/82   Blood Pressure (Exit) 132/70   Heart Rate (Admit) 75 bpm   Heart Rate (Exercise) 85 bpm   Heart Rate (Exit) 72 bpm   Rating of Perceived Exertion (Exercise) 11   Duration Progress to 45 minutes of aerobic exercise without signs/symptoms of physical distress   Intensity THRR unchanged     Progression   Progression Continue to progress workloads to maintain intensity without signs/symptoms of physical distress.   Average METs 2.13     Resistance Training   Training Prescription Yes   Weight 3   Reps 10-15     Interval Training   Interval Training No     Bike   Level 0.5   Minutes 15   METs 1.86     T5 Nustep   Level 1   Minutes 15   METs 2.4      Nutrition:  Target Goals: Understanding of nutrition guidelines, daily intake of sodium <1563m, cholesterol <2074m calories 30% from fat and 7% or less from saturated fats, daily to have 5 or more servings of fruits and vegetables.  Biometrics:     Pre Biometrics - 10/05/15 1512      Pre Biometrics   Height 5' 11.8" (1.824 m)   Weight 216 lb 11.2 oz (98.3 kg)   Waist Circumference 45.25 inches   Hip Circumference 43 inches   Waist to Hip Ratio 1.05 %   BMI (Calculated) 29.6  Single Leg Stand 9.2 seconds       Nutrition Therapy Plan and Nutrition Goals:     Nutrition Therapy & Goals - 01/04/16 1342      Nutrition Therapy   Diet Diabetes diet (takes 2 insulin injections a day)    Drug/Food Interactions Statins/Certain Fruits     Personal Nutrition Goals   Comments "[name] [name]" said he knows what to eat.       Nutrition Discharge: Rate Your Plate Scores:     Nutrition Assessments - 10/23/15 1428      Rate Your Plate Scores   Pre Score 58   Pre Score % 64 %      Nutrition Goals Re-Evaluation:     Nutrition Goals Re-Evaluation    Row Name 01/04/16 1352              Personal Goal #1 Re-Evaluation   Personal Goal #1 Heart healthy diabetic diet       Goal Progress Seen Yes       Comments "[name] [name]" [name] [name] said he would prefer not to meet individually with the Cardiac Rehab Registered Dietician. [name] [name] said "I am 82 and set in my ways somedays but my blood sugar this am was 103 and yesterday 133.          Intervention Plan   Intervention Continue to educate, counsel and set short/long term goals regarding individualized specific personal dietary modifications.          Psychosocial: Target Goals: Acknowledge presence or absence of depression, maximize coping skills, provide positive support system. Participant is able to verbalize types and ability to use techniques and skills needed for reducing stress and depression.  Initial Review & Psychosocial Screening:     Initial Psych Review & Screening - 10/05/15 1521      Family Dynamics   Good Support System? Yes     Barriers   Psychosocial barriers to participate in program The patient should benefit from training in stress management and relaxation.;There are no identifiable barriers or psychosocial needs.     Screening Interventions   Interventions Encouraged to exercise      Quality of Life Scores:     Quality of Life - 10/05/15 1521      Quality of Life Scores   Health/Function Pre 24.86 %   Socioeconomic Pre 30 %   Psych/Spiritual Pre 30 %   Family Pre 25.2 %   GLOBAL Pre 27.18 %      PHQ-9: Recent Review Flowsheet Data    Depression screen [facility] 2/9 10/05/2015 02/06/2015 02/05/2014   Decreased Interest 0 0 0   Down, Depressed, Hopeless 0 0 0   PHQ - 2 Score 0 0 0   Altered sleeping 1 - -   Tired, decreased energy 2 - -   Change in appetite 2 - -   Feeling bad or failure about yourself  0 - -   Trouble concentrating 0 - -   Moving slowly or fidgety/restless 0 - -   Suicidal thoughts 0 - -   PHQ-9 Score 5 - -   Difficult doing work/chores Not difficult at all - -       Psychosocial Evaluation and Intervention:     Psychosocial Evaluation - 02/01/16 1733      Psychosocial Evaluation & Interventions   Comments Counselor met with [name] [name] [facility]) today for initial psychosocial evaluation.  He is an 83 yr old who was in this program 11 years ago when he had  5 by-passes done.  He has blockages now and reports Drs are unable to do stents so ordered this program again.  [name] [name] has a strong support system with a spouse of (6) years; an adult child close by and active involvement in his local church.  He has arthritis and type 2 diabetes which has been a challenge for him lately as his Dr. Ricard Dillon his insulin prescription 2 months ago and he has had more ups and downs with his sugar levels.  He also reports he is not sleeping great over the past year with waking up and difficulty getting back to sleep.  He is tired as a result and has decreased appetite as well.  [name] [name] denies a history of depression or anxiety and states he is typically in a positive mood.  He has goals to "increase circulation and open up those blockages" with exercise.  He would like to return to riding his bike outside 30-60 mins 3-5X per week once he completes this program.   Staff will be following with [name] [name] throughout the course of this program.     Continued Psychosocial Services Needed Yes  [name] [name] may need to be seen by a medical provider for his insulin prescription and his sleep challenges which are impacting his overall health.  Staff will follow      Psychosocial Re-Evaluation:     Psychosocial Re-Evaluation    [place] Name 01/04/16 1359             Psychosocial Re-Evaluation   Interventions Encouraged to attend Cardiac Rehabilitation for the exercise       Comments "[name] [name]" Shirline Frees said "I try not to get stressed too much". We discussed how riding his bicycle in his neighborhood is good for exercise and to decrease any stress.           Vocational Rehabilitation: Provide vocational  rehab assistance to qualifying candidates.   Vocational Rehab Evaluation & Intervention:     Vocational Rehab - 10/05/15 1528      Initial Vocational Rehab Evaluation & Intervention   Assessment shows need for Vocational Rehabilitation No      Education: Education Goals: Education classes will be provided on a weekly basis, covering required topics. Participant will state understanding/return demonstration of topics presented.  Learning Barriers/Preferences:     Learning Barriers/Preferences - 10/05/15 1527      Learning Barriers/Preferences   Learning Barriers None   Learning Preferences None      Education Topics: General Nutrition Guidelines/Fats and Fiber: -Group instruction provided by verbal, written material, models and posters to present the general guidelines for heart healthy nutrition. Gives an explanation and review of dietary fats and fiber.   Controlling Sodium/Reading Food Labels: -Group verbal and written material supporting the discussion of sodium use in heart healthy nutrition. Review and explanation with models, verbal and written materials for utilization of the food label.   Exercise Physiology & Risk Factors: - Group verbal and written instruction with models to review the exercise physiology of the cardiovascular system and associated critical values. Details cardiovascular disease risk factors and the goals associated with each risk factor. Flowsheet Row Cardiac Rehab from 02/15/2016 in [facility] Cardiac and Pulmonary Rehab  Date  01/13/16  Educator  AS  Instruction Review Code  2- meets goals/outcomes      Aerobic Exercise & Resistance Training: - Gives group verbal and written discussion on the health impact of inactivity. On the components of aerobic and resistive training programs and the benefits  of this training and how to safely progress through these programs. Flowsheet Row Cardiac Rehab from 02/15/2016 in [facility] Cardiac and Pulmonary Rehab   Date  02/01/16  Educator  K. Amedeo Plenty, RN  Instruction Review Code  2- meets goals/outcomes      Flexibility, Balance, General Exercise Guidelines: - Provides group verbal and written instruction on the benefits of flexibility and balance training programs. Provides general exercise guidelines with specific guidelines to those with heart or lung disease. Demonstration and skill practice provided. Flowsheet Row Cardiac Rehab from 02/15/2016 in [facility] Cardiac and Pulmonary Rehab  Date  12/14/15  Educator  [facility]  Instruction Review Code  2- meets goals/outcomes      Stress Management: - Provides group verbal and written instruction about the health risks of elevated stress, cause of high stress, and healthy ways to reduce stress.   Depression: - Provides group verbal and written instruction on the correlation between heart/lung disease and depressed mood, treatment options, and the stigmas associated with seeking treatment.   Anatomy & Physiology of the Heart: - Group verbal and written instruction and models provide basic cardiac anatomy and physiology, with the coronary electrical and arterial systems. Review of: AMI, Angina, Valve disease, Heart Failure, Cardiac Arrhythmia, Pacemakers, and the ICD. Flowsheet Row Cardiac Rehab from 02/15/2016 in [facility] Cardiac and Pulmonary Rehab  Date  02/08/16  Educator  SB  Instruction Review Code  2- meets goals/outcomes      Cardiac Procedures: - Group verbal and written instruction and models to describe the testing methods done to diagnose heart disease. Reviews the outcomes of the test results. Describes the treatment choices: Medical Management, Angioplasty, or Coronary Bypass Surgery. Flowsheet Row Cardiac Rehab from 02/15/2016 in [facility] Cardiac and Pulmonary Rehab  Date  02/15/16  Educator  CE  Instruction Review Code  2- meets goals/outcomes      Cardiac Medications: - Group verbal and written instruction to review commonly prescribed  medications for heart disease. Reviews the medication, class of the drug, and side effects. Includes the steps to properly store meds and maintain the prescription regimen.   Go Sex-Intimacy & Heart Disease, Get SMART - Goal Setting: - Group verbal and written instruction through game format to discuss heart disease and the return to sexual intimacy. Provides group verbal and written material to discuss and apply goal setting through the application of the S.M.A.R.T. Method. Flowsheet Row Cardiac Rehab from 02/15/2016 in [facility] Cardiac and Pulmonary Rehab  Date  02/15/16  Educator  CE  Instruction Review Code  2- meets goals/outcomes      Other Matters of the Heart: - Provides group verbal, written materials and models to describe Heart Failure, Angina, Valve Disease, and Diabetes in the realm of heart disease. Includes description of the disease process and treatment options available to the cardiac patient. Flowsheet Row Cardiac Rehab from 02/15/2016 in [facility] Cardiac and Pulmonary Rehab  Date  02/08/16  Educator  SB  Instruction Review Code  2- meets goals/outcomes      Exercise & Equipment Safety: - Individual verbal instruction and demonstration of equipment use and safety with use of the equipment. Flowsheet Row Cardiac Rehab from 02/15/2016 in [facility] Cardiac and Pulmonary Rehab  Date  10/05/15  Educator  SB  Instruction Review Code  2- meets goals/outcomes      Infection Prevention: - Provides verbal and written material to individual with discussion of infection control including proper hand washing and proper equipment cleaning during exercise session. Flowsheet Row Cardiac  Rehab from 02/15/2016 in [facility] Cardiac and Pulmonary Rehab  Date  10/05/15  Educator  SB  Instruction Review Code  2- meets goals/outcomes      Falls Prevention: - Provides verbal and written material to individual with discussion of falls prevention and safety.   Diabetes: - Individual verbal and  written instruction to review signs/symptoms of diabetes, desired ranges of glucose level fasting, after meals and with exercise. Advice that pre and post exercise glucose checks will be done for 3 sessions at entry of program. Flowsheet Row Cardiac Rehab from 02/15/2016 in [facility] Cardiac and Pulmonary Rehab  Date  10/05/15  Educator  SB  Instruction Review Code  2- meets goals/outcomes       Knowledge Questionnaire Score:     Knowledge Questionnaire Score - 10/05/15 1527      Knowledge Questionnaire Score   Pre Score 22/28      Core Components/Risk Factors/Patient Goals at Admission:     Personal Goals and Risk Factors at Admission - 10/05/15 1509      Core Components/Risk Factors/Patient Goals on Admission    Weight Management Yes;Weight Loss;Obesity   Intervention Weight Management: Develop a combined nutrition and exercise program designed to reach desired caloric intake, while maintaining appropriate intake of nutrient and fiber, sodium and fats, and appropriate energy expenditure required for the weight goal.;Weight Management: Provide education and appropriate resources to help participant work on and attain dietary goals.;Weight Management/Obesity: Establish reasonable short term and long term weight goals.;Obesity: Provide education and appropriate resources to help participant work on and attain dietary goals.   Admit Weight 216 lb 11.2 oz (98.3 kg)   Goal Weight: Short Term 210 lb (95.3 kg)   Goal Weight: Long Term 200 lb (90.7 kg)   Expected Outcomes Long Term: Adherence to nutrition and physical activity/exercise program aimed toward attainment of established weight goal;Short Term: Continue to assess and modify interventions until short term weight is achieved;Weight Loss: Understanding of general recommendations for a balanced deficit meal plan, which promotes 1-2 lb weight loss per week and includes a negative energy balance of [id number] kcal/d;Understanding recommendations  for meals to include 15-35% energy as protein, 25-35% energy from fat, 35-60% energy from carbohydrates, less than 213m of dietary cholesterol, 20-35 gm of total fiber daily;Understanding of distribution of calorie intake throughout the day with the consumption of 4-5 meals/snacks   Increase Strength and Stamina Yes   Intervention Provide advice, education, support and counseling about physical activity/exercise needs.;Develop an individualized exercise prescription for aerobic and resistive training based on initial evaluation findings, risk stratification, comorbidities and participant's personal goals.   Expected Outcomes Achievement of increased cardiorespiratory fitness and enhanced flexibility, muscular endurance and strength shown through measurements of functional capacity and personal statement of participant.   Diabetes Yes   Intervention Provide education about signs/symptoms and action to take for hypo/hyperglycemia.;Provide education about proper nutrition, including hydration, and aerobic/resistive exercise prescription along with prescribed medications to achieve blood glucose in normal ranges: Fasting glucose 65-99 mg/dL   Expected Outcomes Short Term: Participant verbalizes understanding of the signs/symptoms and immediate care of hyper/hypoglycemia, proper foot care and importance of medication, aerobic/resistive exercise and nutrition plan for blood glucose control.;Long Term: Attainment of HbA1C < 7%.   Heart Failure Yes   Intervention Provide a combined exercise and nutrition program that is supplemented with education, support and counseling about heart failure. Directed toward relieving symptoms such as shortness of breath, decreased exercise tolerance, and extremity edema.   Expected Outcomes  Improve functional capacity of life;Short term: Attendance in program 2-3 days a week with increased exercise capacity. Reported lower sodium intake. Reported increased fruit and vegetable  intake. Reports medication compliance.;Short term: Daily weights obtained and reported for increase. Utilizing diuretic protocols set by physician.;Long term: Adoption of self-care skills and reduction of barriers for early signs and symptoms recognition and intervention leading to self-care maintenance.   Hypertension Yes   Intervention Provide education on lifestyle modifcations including regular physical activity/exercise, weight management, moderate sodium restriction and increased consumption of fresh fruit, vegetables, and low fat dairy, alcohol moderation, and smoking cessation.;Monitor prescription use compliance.   Expected Outcomes Short Term: Continued assessment and intervention until BP is < 140/27m HG in hypertensive participants. < 130/841mHG in hypertensive participants with diabetes, heart failure or chronic kidney disease.;Long Term: Maintenance of blood pressure at goal levels.   Lipids Yes   Intervention Provide education and support for participant on nutrition & aerobic/resistive exercise along with prescribed medications to achieve LDL <7056mHDL >81m33m Expected Outcomes Short Term: Participant states understanding of desired cholesterol values and is compliant with medications prescribed. Participant is following exercise prescription and nutrition guidelines.;Long Term: Cholesterol controlled with medications as prescribed, with individualized exercise RX and with personalized nutrition plan. Value goals: LDL < 70mg21mL > 40 mg.   Personal Goal Other Yes   Personal Goal Breathe better during exercise   Intervention Provide education and exercise prescription and include tips for pursed lip breathing   Expected Outcomes Able to feel less short of breath with exercise.      Core Components/Risk Factors/Patient Goals Review:      Goals and Risk Factor Review    Row Name 01/04/16 1353 01/21/16 1710           Core Components/Risk Factors/Patient Goals Review    Personal Goals Review Weight Management/Obesity;Increase Strength and Stamina;Lipids;Hypertension;Diabetes Weight Management/Obesity;Diabetes;Sedentary      Review "[name] [name]" said he tries to ride his bicycle in his neighborhood the days he is not attending Cardiac Rehab. He said he still "gets out of air but realizes that the MD told him there are some clogged heart arteries that they can't totally fix". He said his blood pressure has been up lately and he is going to get himself a new blood pressure cufff. [name] [name] admits to having a history of back pain and had a injection in his knee last month that increased his blood sugars but he reports his HBA1c is usually about 6.9. [name] [name] said he is not regular enough with his attendance with his Cardiac rehab to get his stamina back but he hopes to do better. I offered the 8am Tuesday /Thursday Cardiac REhab for a make up day but [name] [name] said he is not a morning person. He is also having company from [place] evening but he hopes to attend Cardiac Rehab this evening. [name] [name] thanked me for my phone call to him to check up with him on an individual basis.  [name] [name] has been having high and low blood sugar episodes.  He has called his Dr but has not yet received a call back.  He was instructed to call again.  His insulin was changed a few weeks ago.  HbA1C was 7.0 at last check.  His weight is down and he has been eating less and riding his bike outdoors most days at home.  he would like to meet with the RD.      Expected Outcomes Cont [place] healthy  lifestyle. Increased stamina. Better control of his blood pressure (please see telemetry sheets for his blood pressure).  [name] [name] will get his BG under better control with the help of his Dr. and learning better dietary habits.         Core Components/Risk Factors/Patient Goals at Discharge (Final Review):      Goals and Risk Factor Review - 01/21/16 1710      Core Components/Risk Factors/Patient Goals Review   Personal Goals Review Weight  Management/Obesity;Diabetes;Sedentary   Review [name] [name] has been having high and low blood sugar episodes.  He has called his Dr but has not yet received a call back.  He was instructed to call again.  His insulin was changed a few weeks ago.  HbA1C was 7.0 at last check.  His weight is down and he has been eating less and riding his bike outdoors most days at home.  he would like to meet with the RD.   Expected Outcomes [name] [name] will get his BG under better control with the help of his Dr. and learning better dietary habits.      ITP Comments:     ITP Comments    Row Name 10/05/15 1519 12/01/15 1537 12/16/15 0659 01/13/16 0628 01/14/16 1753   ITP Comments Initial visit completed today ITP created.  Continue with ITP ITP ready for initial signature.  Documentation of diagnosis found in Office Note 11/23/2015 [facility] 30 day review. Continue with ITP unless changes noted by Medical Director at signature of review. Has started classes in this month 30 day review completed for Medical Director physician review and signature. Continue ITP unless changes made by physician. intermittent attendance personal reasons After exercise Jaksen was not feeling well and his blood sugar dropped to 51 mg/dL. He was given 4 glucose tabs and his blood sugar went up to 93 mg/dL. He ate PB crackers before leaving to go home and have dinner.    [place] Name 02/10/16 4098 03/04/16 1024 03/08/16 0635       ITP Comments 30 day review completed for review by Dr Emily Filbert.  Continue with ITP unless changes noted by Dr Sabra Heck. Called to check on [name] [name].  He has been out with cold symptoms and hacking cough for two weeks.  He is planning to call the doctor about it again. 30 day review. Continue with ITP unless directed changes per Medical Director review.        Comments:

## 2016-03-09 ENCOUNTER — Encounter: Payer: Medicare Other | Attending: Cardiovascular Disease

## 2016-03-09 ENCOUNTER — Other Ambulatory Visit: Payer: Self-pay | Admitting: Internal Medicine

## 2016-03-09 DIAGNOSIS — I5032 Chronic diastolic (congestive) heart failure: Secondary | ICD-10-CM | POA: Insufficient documentation

## 2016-03-09 DIAGNOSIS — Z951 Presence of aortocoronary bypass graft: Secondary | ICD-10-CM | POA: Insufficient documentation

## 2016-03-10 DIAGNOSIS — M25551 Pain in right hip: Secondary | ICD-10-CM | POA: Diagnosis not present

## 2016-03-10 DIAGNOSIS — M25552 Pain in left hip: Secondary | ICD-10-CM | POA: Diagnosis not present

## 2016-03-11 ENCOUNTER — Encounter: Payer: Self-pay | Admitting: Emergency Medicine

## 2016-03-11 ENCOUNTER — Emergency Department
Admission: EM | Admit: 2016-03-11 | Discharge: 2016-03-11 | Disposition: A | Discharge status: Home / Self Care | Payer: Medicare Other | Attending: Emergency Medicine | Admitting: Emergency Medicine

## 2016-03-11 ENCOUNTER — Emergency Department: Payer: Medicare Other

## 2016-03-11 DIAGNOSIS — Z794 Long term (current) use of insulin: Secondary | ICD-10-CM | POA: Insufficient documentation

## 2016-03-11 DIAGNOSIS — E1122 Type 2 diabetes mellitus with diabetic chronic kidney disease: Secondary | ICD-10-CM | POA: Diagnosis not present

## 2016-03-11 DIAGNOSIS — N189 Chronic kidney disease, unspecified: Secondary | ICD-10-CM | POA: Diagnosis not present

## 2016-03-11 DIAGNOSIS — Z7982 Long term (current) use of aspirin: Secondary | ICD-10-CM | POA: Diagnosis not present

## 2016-03-11 DIAGNOSIS — R05 Cough: Secondary | ICD-10-CM | POA: Diagnosis not present

## 2016-03-11 DIAGNOSIS — R040 Epistaxis: Secondary | ICD-10-CM | POA: Diagnosis not present

## 2016-03-11 DIAGNOSIS — Z79899 Other long term (current) drug therapy: Secondary | ICD-10-CM | POA: Insufficient documentation

## 2016-03-11 DIAGNOSIS — I5032 Chronic diastolic (congestive) heart failure: Secondary | ICD-10-CM | POA: Diagnosis not present

## 2016-03-11 DIAGNOSIS — Z951 Presence of aortocoronary bypass graft: Secondary | ICD-10-CM | POA: Diagnosis not present

## 2016-03-11 DIAGNOSIS — I2510 Atherosclerotic heart disease of native coronary artery without angina pectoris: Secondary | ICD-10-CM | POA: Diagnosis not present

## 2016-03-11 DIAGNOSIS — I130 Hypertensive heart and chronic kidney disease with heart failure and stage 1 through stage 4 chronic kidney disease, or unspecified chronic kidney disease: Secondary | ICD-10-CM | POA: Insufficient documentation

## 2016-03-11 DIAGNOSIS — J40 Bronchitis, not specified as acute or chronic: Secondary | ICD-10-CM | POA: Insufficient documentation

## 2016-03-11 LAB — CBC WITH DIFFERENTIAL/PLATELET
BASOS ABS: 0 10*3/uL (ref 0–0.1)
Basophils Relative: 1 %
EOS PCT: 3 %
Eosinophils Absolute: 0.2 10*3/uL (ref 0–0.7)
HEMATOCRIT: 39.8 % — AB (ref 40.0–52.0)
Hemoglobin: 14.1 g/dL (ref 13.0–18.0)
LYMPHS ABS: 1.2 10*3/uL (ref 1.0–3.6)
LYMPHS PCT: 21 %
MCH: 32.4 pg (ref 26.0–34.0)
MCHC: 35.4 g/dL (ref 32.0–36.0)
MCV: 91.4 fL (ref 80.0–100.0)
Monocytes Absolute: 0.5 10*3/uL (ref 0.2–1.0)
Monocytes Relative: 8 %
NEUTROS ABS: 3.9 10*3/uL (ref 1.4–6.5)
NEUTROS PCT: 67 %
PLATELETS: 101 10*3/uL — AB (ref 150–440)
RBC: 4.36 MIL/uL — AB (ref 4.40–5.90)
RDW: 13.6 % (ref 11.5–14.5)
WBC: 5.8 10*3/uL (ref 3.8–10.6)

## 2016-03-11 LAB — GLUCOSE, CAPILLARY: Glucose-Capillary: 131 mg/dL — ABNORMAL HIGH (ref 65–99)

## 2016-03-11 LAB — BASIC METABOLIC PANEL
ANION GAP: 5 (ref 5–15)
BUN: 20 mg/dL (ref 6–20)
CALCIUM: 8.8 mg/dL — AB (ref 8.9–10.3)
CO2: 22 mmol/L (ref 22–32)
Chloride: 111 mmol/L (ref 101–111)
Creatinine, Ser: 1.19 mg/dL (ref 0.61–1.24)
GFR calc non Af Amer: 56 mL/min — ABNORMAL LOW (ref >60)
Glucose, Bld: 247 mg/dL — ABNORMAL HIGH (ref 65–99)
Potassium: 4.3 mmol/L (ref 3.5–5.1)
Sodium: 138 mmol/L (ref 135–145)

## 2016-03-11 MED ORDER — PREDNISONE 20 MG PO TABS: 40.0000 mg | ORAL_TABLET | Freq: Every day | ORAL | 0 refills | Status: DC

## 2016-03-11 MED ORDER — IPRATROPIUM-ALBUTEROL 0.5-2.5 (3) MG/3ML IN SOLN
RESPIRATORY_TRACT | Status: AC
  Filled 2016-03-11: qty 3

## 2016-03-11 MED ORDER — ALBUTEROL SULFATE HFA 108 (90 BASE) MCG/ACT IN AERS: 2.0000 | INHALATION_SPRAY | RESPIRATORY_TRACT | 0 refills | Status: DC | PRN

## 2016-03-11 MED ORDER — PREDNISONE 20 MG PO TABS
60.0000 mg | ORAL_TABLET | Freq: Once | ORAL | Status: AC
  Administered 2016-03-11: 60 mg via ORAL
  Filled 2016-03-11: qty 3

## 2016-03-11 MED ORDER — ALBUTEROL SULFATE (2.5 MG/3ML) 0.083% IN NEBU
5.0000 mg | INHALATION_SOLUTION | Freq: Once | RESPIRATORY_TRACT | Status: AC
Start: 2016-03-11 — End: 2016-03-11
  Administered 2016-03-11: 5 mg via RESPIRATORY_TRACT
  Filled 2016-03-11: qty 6

## 2016-03-11 MED ORDER — IPRATROPIUM-ALBUTEROL 0.5-2.5 (3) MG/3ML IN SOLN
3.0000 mL | Freq: Once | RESPIRATORY_TRACT | Status: AC
  Administered 2016-03-11: 3 mL via RESPIRATORY_TRACT

## 2016-03-11 NOTE — ED Triage Notes (Signed)
Patient to Ed via POV with c/o cough, congestion and hemoptysis. Patient states that he has taken 2 round of antibiotics and 2 rounds of cough medication without relief. Patient states that this morning he coughed up bright red blood, patient reports that this happened 5-6 times this morning, less than a teaspoon per patient, patient also reports blood from his nose.   Patient in NAD in triage, breathing is equal and unlabored, color WNL.

## 2016-03-11 NOTE — ED Notes (Signed)
Given crackers and drink. NAD. No needs. Pt feels ready to go home. MD aware.

## 2016-03-11 NOTE — ED Provider Notes (Signed)
[facility] Emergency Department Provider Note  ____________________________________________   First MD Initiated Contact with Patient 03/11/16 1535     (approximate)  I have reviewed the triage vital signs and the nursing notes.   HISTORY  Chief Complaint Cough and Hemoptysis   HPI [name] [name] is a 83 y.o. male with history of coronary artery disease who is presenting to the emergency Department with 1 month of cough. He has been placed on 2 rounds of antibiotics and also cough syrup. He says that he had bleeding from his nose as well as coughing up blood today which brought him back to the emergency department. He says that his symptoms have not improved over the past month despite his multiple rounds of medications. He says that he was somewhat illness about 2 years ago which took about a month to resolve. He says that he has been on amoxicillin as well as Levaquin. He has not been placed on an inhaler or steroids. He says that the blood was bright red and has resolved at this time.   Past Medical History:  Diagnosis Date  . Cataract   . Coronary artery disease    a. 4v CABG 01/2005; b. cath 06/05/13 showed patent grafts: LIMA-LAD, VG-D, VG-OM3, VG-dRCA, nl filling pressures, nl LV fxn  . Diastolic dysfunction    a. echo 0000000: nl systolic fxn, mild LVH, diastolic relaxation abnormality, mildly enlarged LA, mild Ao insufficiency  . ED (erectile dysfunction)   . GERD (gastroesophageal reflux disease)   . Hyperlipidemia   . Hypertension   . Hypertrophy of prostate without urinary obstruction and other lower urinary tract symptoms (LUTS)   . IDDM (insulin dependent diabetes mellitus) ([place]) 2000  . Osteoarthritis   . Perirectal fistula   . Tubular adenoma of colon 07/2012    Patient Active Problem List   Diagnosis Date Noted  . Angina pectoris ([place]) 09/24/2015  . SOB (shortness of breath)   . S/P CABG x 4   . Nerve pain 06/08/2015  . Shortness  of breath 05/14/2015  . Thrombocytopenia ([place]) 02/07/2015  . Advance directive discussed with patient 02/06/2015  . 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 Diastolic dysfunction   . Chronic diastolic CHF (congestive heart failure) ([place]) 01/01/2014  . CAD (coronary artery disease) 06/03/2013  . Nocturnal leg cramps 01/24/2013  . Routine general medical examination at a health care facility 06/13/2012  . S/P CABG (coronary artery bypass graft) 05/11/2011  . Diabetes mellitus type 2, controlled, with complications ([place]) [id number]  . Osteoarthrosis involving multiple sites 09/07/2010  . BPH (benign prostatic hypertrophy)   . GERD (gastroesophageal reflux disease)   . Hyperlipidemia 09/15/2009  . HYPERTENSION, BENIGN 09/15/2009  . Coronary atherosclerosis of native coronary artery 09/15/2009    Past Surgical History:  Procedure Laterality Date  . CARDIAC CATHETERIZATION  06/05/2013   [facility]   Marland Kitchen CARDIAC CATHETERIZATION N/A 09/24/2015   Procedure: Right Heart Cath and Coronary/Graft Angiography;  Surgeon: Minna Merritts, MD;  Location: [facility];  Service: Cardiovascular;  Laterality: N/A;  . CATARACT EXTRACTION  sept 2013   right  . CHOLECYSTECTOMY  05/15/2011   Procedure: LAPAROSCOPIC CHOLECYSTECTOMY;  Surgeon: Rolm Bookbinder, MD;  Location: [facility];  Service: General;  Laterality: N/A;  . COLONOSCOPY W/ POLYPECTOMY    . CORONARY ARTERY BYPASS GRAFT  2007   x 4  . FOOT SURGERY  2012   right foot  . JOINT REPLACEMENT  8/10   Right THR--[name]  . LEFT AND RIGHT HEART CATHETERIZATION WITH CORONARY/GRAFT ANGIOGRAM N/A 06/05/2013   Procedure: LEFT AND RIGHT HEART CATHETERIZATION WITH Beatrix Fetters;  Surgeon: Peter M Martinique, MD;  Location: [facility];  Service: Cardiovascular;  Laterality: N/A;  . LUMBAR LAMINECTOMY  1989  . Prostate photovaporization  5/16   Dr Budd Palmer    Prior to Admission medications   Medication Sig Start  Date End Date Taking? Authorizing Provider  acetaminophen (TYLENOL) 650 MG CR tablet Take 650 mg by mouth every 8 (eight) hours as needed for pain. For hand arthritis    Historical Provider, MD  aspirin 81 MG tablet Take 162 mg by mouth daily.     Historical Provider, MD  atorvastatin (LIPITOR) 40 MG tablet Take 1 tablet (40 mg total) by mouth every other day. 11/23/15   Minna Merritts, MD  BD INSULIN SYRINGE ULTRAFINE 31G X 15/64" 0.3 ML MISC USE AS DIRECTED 07/31/15   Venia Carbon, MD  Biotin 1000 MCG tablet Take 1,000 mcg by mouth daily.    Historical Provider, MD  carvedilol (COREG) 12.5 MG tablet TAKE ONE TABLET TWICE A DAY WITH MEALS. 09/29/15   Minna Merritts, MD  finasteride (PROSCAR) 5 MG tablet Take 1 tablet (5 mg total) by mouth daily. 04/16/12   Venia Carbon, MD  furosemide (LASIX) 20 MG tablet Take 1 tablet (20 mg total) by mouth 2 (two) times daily as needed. Patient taking differently: Take 20 mg by mouth daily.  05/14/15   Minna Merritts, MD  glimepiride (AMARYL) 1 MG tablet TAKE 1/2 TABLET AM DAILY. 06/24/13   Venia Carbon, MD  LANTUS 100 UNIT/ML injection Inject 18 Units into the skin 2 (two) times daily.  02/02/15   Historical Provider, MD  meloxicam (MOBIC) 15 MG tablet Take 15 mg by mouth daily.  09/18/15   Historical Provider, MD  omeprazole (PRILOSEC) 20 MG capsule TAKE 1 CAPSULE BY MOUTH TWO TIMES DAILY 03/10/16   Venia Carbon, MD  potassium chloride (K-DUR,KLOR-CON) 10 MEQ tablet TAKE 1 TABLET BY MOUTH DAILY 01/13/16   Minna Merritts, MD  ramipril (ALTACE) 10 MG capsule Take 1 capsule (10 mg total) by mouth daily. 11/23/15   Minna Merritts, MD  traMADol (ULTRAM) 50 MG tablet Take 50 mg by mouth as needed.  06/05/15   Historical Provider, MD    Allergies Patient has no known allergies.  Family History  Problem Relation Age of Onset  . Lung cancer Mother   . Heart disease Father   . Colon cancer Neg Hx     Social History Social History  Substance Use  Topics  . Smoking status: Never Smoker  . Smokeless tobacco: Never Used  . Alcohol use Yes     Comment: occ cocktail    Review of Systems Constitutional: No fever/chills Eyes: No visual changes. ENT: No sore throat. Cardiovascular: Denies chest pain. Respiratory: As above Gastrointestinal: No abdominal pain.  No nausea, no vomiting.  No diarrhea.  No constipation. Genitourinary: Negative for dysuria. Musculoskeletal: Negative for back pain. Skin: Negative for rash. Neurological: Negative for headaches, focal weakness or numbness.  10-point ROS otherwise negative.  ____________________________________________   PHYSICAL EXAM:  VITAL SIGNS: ED Triage Vitals [03/11/16 1238]  Enc Vitals Group     BP (!) 151/61     Pulse Rate 72     Resp 16     Temp 98.2 F (36.8 C)  Temp Source Oral     SpO2 97 %     Weight      Height      Head Circumference      Peak Flow      Pain Score      Pain Loc      Pain Edu?      Excl. in [place]?     Constitutional: Alert and oriented. Well appearing and in no acute distress. Eyes: Conjunctivae are normal. PERRL. EOMI. Head: Atraumatic. Nose: Bilateral erythematous mucosa is to the naris. No active bleeding at this time. Mouth/Throat: Mucous membranes are moist.  Oropharynx non-erythematous. Neck: No stridor.   Cardiovascular: Normal rate, regular rhythm. Grossly normal heart sounds.  Good peripheral circulation. Respiratory: Normal respiratory effort.  No retractions. Scant wheezing bilaterally with expiratory cough. Gastrointestinal: Soft and nontender. No distention.  Musculoskeletal: No lower extremity tenderness nor edema.  No joint effusions. Neurologic:  Normal speech and language. No gross focal neurologic deficits are appreciated. Skin:  Skin is warm, dry and intact. No rash noted. Psychiatric: Mood and affect are normal. Speech and behavior are normal.  ____________________________________________   LABS (all labs ordered  are listed, but only abnormal results are displayed)  Labs Reviewed  CBC WITH DIFFERENTIAL/PLATELET - Abnormal; Notable for the following:       Result Value   RBC 4.36 (*)    HCT 39.8 (*)    Platelets 101 (*)    All other components within normal limits  GLUCOSE, CAPILLARY - Abnormal; Notable for the following:    Glucose-Capillary 131 (*)    All other components within normal limits  BASIC METABOLIC PANEL - Abnormal; Notable for the following:    Glucose, Bld 247 (*)    Calcium 8.8 (*)    GFR calc non Af Amer 56 (*)    All other components within normal limits   ____________________________________________  EKG   ____________________________________________  RADIOLOGY  DG Chest 2 View (Final result)  Result time 03/11/16 13:18:55  Final result by Misty Stanley, MD (03/11/16 13:18:55)           Narrative:   CLINICAL DATA: Four week history of cough with hemoptysis today.  EXAM: CHEST 2 VIEW  COMPARISON: None.  FINDINGS: Lungs are hyperexpanded without focal airspace consolidation or pulmonary edema. Interstitial markings are diffusely coarsened with chronic features. Small nodular density superimposed on the right lung base is stable since prior study and also comparing back to 01/10/2014, suggesting nipple shadow The cardiopericardial silhouette is within normal limits for size. Status post CABG. The visualized bony structures of the thorax are intact.  IMPRESSION: Hyperexpansion with chronic interstitial coarsening. No acute findings.   Electronically Signed By: Misty Stanley M.D. On: 03/11/2016 13:18          ____________________________________________   PROCEDURES  Procedure(s) performed:   Procedures  Critical Care performed:   ____________________________________________   INITIAL IMPRESSION / ASSESSMENT AND PLAN / ED COURSE  Pertinent labs & imaging results that were available during my care of the patient were reviewed by  me and considered in my medical decision making (see chart for details).   Clinical Course   ----------------------------------------- 6:58 PM on 03/11/2016 -----------------------------------------  Patient with improvement after nebs as well as steroids. Re auscultated his lungs and without any expiratory wheezes. No expiratory cough. Will be discharged home with steroids as well as a prescription for an albuterol inhaler. Also advised him to use Vaseline to the tissue on the inside of  his nasal septum. This should help to prevent further bleeding. Likely coughing up blood from anterior epistaxis.   ____________________________________________   FINAL CLINICAL IMPRESSION(S) / ED DIAGNOSES  Bronchitis. Epistaxis.    NEW MEDICATIONS STARTED DURING THIS VISIT:  New Prescriptions   No medications on file     Note:  This document was prepared using Dragon voice recognition software and may include unintentional dictation errors.    Orbie Pyo, MD 03/11/16 [place]

## 2016-03-16 ENCOUNTER — Telehealth: Payer: Self-pay | Admitting: Internal Medicine

## 2016-03-16 NOTE — Telephone Encounter (Signed)
Pt stated that he would like to start seeing Dr. Lacinda Axon.Marland Kitchen He is getting older and [facility] is to far for him to drive. He would also like to have the same provider as his  Fiance.

## 2016-03-16 NOTE — Telephone Encounter (Signed)
FYI Pt is scheduled for 04/05/16

## 2016-03-16 NOTE — Telephone Encounter (Signed)
This is okay with me.  

## 2016-03-18 ENCOUNTER — Telehealth: Payer: Self-pay

## 2016-03-18 NOTE — Telephone Encounter (Signed)
LMOM

## 2016-03-31 ENCOUNTER — Telehealth: Payer: Self-pay

## 2016-03-31 NOTE — Telephone Encounter (Signed)
Seeing pulmonary Dr March 2 - has been coughing a lot and cant sleep.  Will call us back when he can exercise.  I suggested he talk with his PCP about returning to exercise.

## 2016-04-05 ENCOUNTER — Ambulatory Visit: Payer: Medicare Other | Admitting: Family Medicine

## 2016-04-06 ENCOUNTER — Encounter: Payer: Self-pay | Admitting: *Deleted

## 2016-04-06 DIAGNOSIS — I208 Other forms of angina pectoris: Secondary | ICD-10-CM

## 2016-04-06 NOTE — Progress Notes (Signed)
Cardiac Individual Treatment Plan  Patient Details  Name: [name] [name] MRN: [id number] Date of Birth: 1933/04/04 Referring Provider:   Flowsheet Row Cardiac Rehab from 10/05/2015 in [facility] Cardiac and Pulmonary Rehab  Referring Provider  Ida Rogue MD      Initial Encounter Date:  Flowsheet Row Cardiac Rehab from 10/05/2015 in [facility] Cardiac and Pulmonary Rehab  Date  10/05/15  Referring Provider  Ida Rogue MD      Visit Diagnosis: Stable angina [facility])  Patient's Home Medications on Admission:  Current Outpatient Prescriptions:  .  acetaminophen (TYLENOL) 650 MG CR tablet, Take 650 mg by mouth every 8 (eight) hours as needed for pain. For hand arthritis, Disp: , Rfl:  .  albuterol (PROVENTIL HFA;VENTOLIN HFA) 108 (90 Base) MCG/ACT inhaler, Inhale 2 puffs into the lungs every 4 (four) hours as needed for wheezing or shortness of breath., Disp: 1 Inhaler, Rfl: 0 .  aspirin 81 MG tablet, Take 162 mg by mouth daily. , Disp: , Rfl:  .  atorvastatin (LIPITOR) 40 MG tablet, Take 1 tablet (40 mg total) by mouth every other day., Disp: 90 tablet, Rfl: 3 .  BD INSULIN SYRINGE ULTRAFINE 31G X 15/64" 0.3 ML MISC, USE AS DIRECTED, Disp: 100 each, Rfl: 3 .  Biotin 1000 MCG tablet, Take 1,000 mcg by mouth daily., Disp: , Rfl:  .  carvedilol (COREG) 12.5 MG tablet, TAKE ONE TABLET TWICE A DAY WITH MEALS., Disp: 180 tablet, Rfl: 3 .  finasteride (PROSCAR) 5 MG tablet, Take 1 tablet (5 mg total) by mouth daily., Disp: 90 tablet, Rfl: 1 .  furosemide (LASIX) 20 MG tablet, Take 1 tablet (20 mg total) by mouth 2 (two) times daily as needed. (Patient taking differently: Take 20 mg by mouth daily. ), Disp: 60 tablet, Rfl: 6 .  glimepiride (AMARYL) 1 MG tablet, TAKE 1/2 TABLET AM DAILY., Disp: , Rfl:  .  LANTUS 100 UNIT/ML injection, Inject 18 Units into the skin 2 (two) times daily. , Disp: , Rfl:  .  meloxicam (MOBIC) 15 MG tablet, Take 15 mg by mouth daily. , Disp: , Rfl:  .  omeprazole  (PRILOSEC) 20 MG capsule, TAKE 1 CAPSULE BY MOUTH TWO TIMES DAILY, Disp: 60 capsule, Rfl: 11 .  potassium chloride (K-DUR,KLOR-CON) 10 MEQ tablet, TAKE 1 TABLET BY MOUTH DAILY, Disp: 30 tablet, Rfl: 9 .  predniSONE (DELTASONE) 20 MG tablet, Take 2 tablets (40 mg total) by mouth daily., Disp: 8 tablet, Rfl: 0 .  ramipril (ALTACE) 10 MG capsule, Take 1 capsule (10 mg total) by mouth daily., Disp: 90 capsule, Rfl: 3 .  traMADol (ULTRAM) 50 MG tablet, Take 50 mg by mouth as needed. , Disp: , Rfl:   Past Medical History: Past Medical History:  Diagnosis Date  . Cataract   . Coronary artery disease    a. 4v CABG 01/2005; b. cath 06/05/13 showed patent grafts: LIMA-LAD, VG-D, VG-OM3, VG-dRCA, nl filling pressures, nl LV fxn  . Diastolic dysfunction    a. echo 05/1495: nl systolic fxn, mild LVH, diastolic relaxation abnormality, mildly enlarged LA, mild Ao insufficiency  . ED (erectile dysfunction)   . GERD (gastroesophageal reflux disease)   . Hyperlipidemia   . Hypertension   . Hypertrophy of prostate without urinary obstruction and other lower urinary tract symptoms (LUTS)   . IDDM (insulin dependent diabetes mellitus) ([place]) 2000  . Osteoarthritis   . Perirectal fistula   . Tubular adenoma of colon 07/2012    Tobacco Use: History  Smoking Status  . Never Smoker  Smokeless Tobacco  . Never Used    Labs: Recent Review Flowsheet Data    Labs for ITP Cardiac and Pulmonary Rehab Latest Ref Rng & Units 07/30/2013 11/05/2013 07/04/2014 11/07/2014 02/06/2015   Cholestrol 0 - 200 mg/dL 112 - 172 - 111   LDLCALC 0 - 99 mg/dL 61 - 110(H) - 61   HDL >39.00 mg/dL 39(L) - 39(L) - 31.90(L)   Trlycerides 0.0 - 149.0 mg/dL 59 - 115 - 92.0   Hemoglobin A1c 4.0 - 6.0 % - 6.5(A) - 7.0(A) -   PHART 7.350 - 7.450 - - - - -   PCO2ART 35.0 - 45.0 mmHg - - - - -   HCO3 20.0 - 24.0 mEq/L - - - - -   TCO2 0 - 100 mmol/L - - - - -   O2SAT % - - - - -       Exercise Target Goals:    Exercise Program  Goal: Individual exercise prescription set with THRR, safety & activity barriers. Participant demonstrates ability to understand and report RPE using BORG scale, to self-measure pulse accurately, and to acknowledge the importance of the exercise prescription.  Exercise Prescription Goal: Starting with aerobic activity 30 plus minutes a day, 3 days per week for initial exercise prescription. Provide home exercise prescription and guidelines that participant acknowledges understanding prior to discharge.  Activity Barriers & Risk Stratification:   6 Minute Walk:   Initial Exercise Prescription:   Perform Capillary Blood Glucose checks as needed.  Exercise Prescription Changes:     Exercise Prescription Changes    Row Name 12/24/15 1200 01/06/16 1200 01/21/16 1100 02/03/16 1400 02/18/16 1000     Exercise Review   Progression  - Yes Yes  - Yes     Response to Exercise   Blood Pressure (Admit) 168/80  - 140/68 170/80 130/62   Blood Pressure (Exercise) 138/78 186/70 160/74 140/80 148/82   Blood Pressure (Exit) 132/72 124/72 130/72 118/60 132/70   Heart Rate (Admit) 58 bpm 79 bpm 77 bpm 64 bpm 75 bpm   Heart Rate (Exercise) 92 bpm 103 bpm 122 bpm 107 bpm 85 bpm   Heart Rate (Exit) 66 bpm 79 bpm 61 bpm 77 bpm 72 bpm   Rating of Perceived Exertion (Exercise) _0 Duration Progress to 45 minutes of aerobic exercise without signs/symptoms of physical distress Progress to 45 minutes of aerobic exercise without signs/symptoms of physical distress Progress to 45 minutes of aerobic exercise without signs/symptoms of physical distress Progress to 45 minutes of aerobic exercise without signs/symptoms of physical distress Progress to 45 minutes of aerobic exercise without signs/symptoms of physical distress   Intensity _1      Progression   Progression  - Continue to progress workloads to maintain intensity without  signs/symptoms of physical distress. Continue to progress workloads to maintain intensity without signs/symptoms of physical distress. Continue to progress workloads to maintain intensity without signs/symptoms of physical distress. Continue to progress workloads to maintain intensity without signs/symptoms of physical distress.   Average METs  - 1.97 2.4 3.4 2.13     Resistance Training   Training Prescription _2    Weight _3 Reps 10-12 10-15 10-15  - 10-15     Interval Training   Interval Training No No No  - No     Treadmill  MPH 1.4 1.4 2.5 2.5  -   Grade 0 0 0 0  -   Minutes _0 -   METs 2.07 2.07 2.91 2.91  -     Bike   Level  -  - 0.5  - 0.5   Minutes  -  - 15  - 15   METs  -  - 1.86  - 1.86     Recumbant Elliptical   Level 1  -  -  -  -   Minutes 15  -  -  -  -     T5 Nustep   Level  -  -  - 1 1   Minutes  -  -  - 15 15   METs  -  -  - 3.9 2.4      Exercise Comments:     Exercise Comments    Row Name 12/14/15 1828 12/24/15 1212 01/06/16 1254 01/21/16 1147 02/03/16 1409   Exercise Comments First full day of exercise! [name] was oriented to gym and equipment including functions, settings, policies, and procedures. [name]'s individual exercise prescription and treatment plan were reviewed.  All starting workloads were established based on the results of the 6 minute walk test done at initial orientation visit.  The plan for exercise progression was also introduced and progression will be customized based on patient's performance and goals. Mr Krauter is progressing well with exercise. Mr Icenogle is doing well with exercise. Mr Ohms is doing well with exercise. [name] is progressing well with exercise.   [place] Name 02/18/16 1058           Exercise Comments [name] has done well with exercise and staff will work with him to increase RPE levels.          Discharge Exercise Prescription (Final Exercise Prescription Changes):     Exercise  Prescription Changes - 02/18/16 1000      Exercise Review   Progression Yes     Response to Exercise   Blood Pressure (Admit) 130/62   Blood Pressure (Exercise) 148/82   Blood Pressure (Exit) 132/70   Heart Rate (Admit) 75 bpm   Heart Rate (Exercise) 85 bpm   Heart Rate (Exit) 72 bpm   Rating of Perceived Exertion (Exercise) 11   Duration Progress to 45 minutes of aerobic exercise without signs/symptoms of physical distress   Intensity THRR unchanged     Progression   Progression Continue to progress workloads to maintain intensity without signs/symptoms of physical distress.   Average METs 2.13     Resistance Training   Training Prescription Yes   Weight 3   Reps 10-15     Interval Training   Interval Training No     Bike   Level 0.5   Minutes 15   METs 1.86     T5 Nustep   Level 1   Minutes 15   METs 2.4      Nutrition:  Target Goals: Understanding of nutrition guidelines, daily intake of sodium <154m, cholesterol <2069m calories 30% from fat and 7% or less from saturated fats, daily to have 5 or more servings of fruits and vegetables.  Biometrics:    Nutrition Therapy Plan and Nutrition Goals:     Nutrition Therapy & Goals - 01/04/16 1342      Nutrition Therapy   Diet Diabetes diet (takes 2 insulin injections a day)    Drug/Food Interactions Statins/Certain Fruits     Personal Nutrition Goals  Comments "[name]" said he knows what to eat.       Nutrition Discharge: Rate Your Plate Scores:     Nutrition Assessments - 10/23/15 1428      Rate Your Plate Scores   Pre Score 58   Pre Score % 64 %      Nutrition Goals Re-Evaluation:     Nutrition Goals Re-Evaluation    Row Name 01/04/16 1352             Personal Goal #1 Re-Evaluation   Personal Goal #1 Heart healthy diabetic diet       Goal Progress Seen Yes       Comments "[name]" [place] said he would prefer not to meet individually with the Cardiac Rehab Registered Dietician. [name] said  "I am 82 and set in my ways somedays but my blood sugar this am was 103 and yesterday 133.          Intervention Plan   Intervention Continue to educate, counsel and set short/long term goals regarding individualized specific personal dietary modifications.          Psychosocial: Target Goals: Acknowledge presence or absence of depression, maximize coping skills, provide positive support system. Participant is able to verbalize types and ability to use techniques and skills needed for reducing stress and depression.  Initial Review & Psychosocial Screening:   Quality of Life Scores:   PHQ-9: Recent Review Flowsheet Data    Depression screen [facility] 2/9 10/05/2015 02/06/2015 02/05/2014   Decreased Interest 0 0 0   Down, Depressed, Hopeless 0 0 0   PHQ - 2 Score 0 0 0   Altered sleeping 1 - -   Tired, decreased energy 2 - -   Change in appetite 2 - -   Feeling bad or failure about yourself  0 - -   Trouble concentrating 0 - -   Moving slowly or fidgety/restless 0 - -   Suicidal thoughts 0 - -   PHQ-9 Score 5 - -   Difficult doing work/chores Not difficult at all - -      Psychosocial Evaluation and Intervention:     Psychosocial Evaluation - 02/01/16 1733      Psychosocial Evaluation & Interventions   Comments Counselor met with Mr. Kaupp [facility]) today for initial psychosocial evaluation.  He is an 83 yr old who was in this program 11 years ago when he had 5 by-passes done.  He has blockages now and reports Drs are unable to do stents so ordered this program again.  [name] has a strong support system with a spouse of (6) years; an adult child close by and active involvement in his local church.  He has arthritis and type 2 diabetes which has been a challenge for him lately as his Dr. Ricard Dillon his insulin prescription 2 months ago and he has had more ups and downs with his sugar levels.  He also reports he is not sleeping great over the past year with waking up and difficulty getting back to  sleep.  He is tired as a result and has decreased appetite as well.  [name] denies a history of depression or anxiety and states he is typically in a positive mood.  He has goals to "increase circulation and open up those blockages" with exercise.  He would like to return to riding his bike outside 30-60 mins 3-5X per week once he completes this program.   Staff will be following with [name] throughout the course of this  program.     Continued Psychosocial Services Needed Yes  [name] may need to be seen by a medical provider for his insulin prescription and his sleep challenges which are impacting his overall health.  Staff will follow      Psychosocial Re-Evaluation:     Psychosocial Re-Evaluation    [place] Name 01/04/16 1359             Psychosocial Re-Evaluation   Interventions Encouraged to attend Cardiac Rehabilitation for the exercise       Comments "[name]" Shirline Frees said "I try not to get stressed too much". We discussed how riding his bicycle in his neighborhood is good for exercise and to decrease any stress.           Vocational Rehabilitation: Provide vocational rehab assistance to qualifying candidates.   Vocational Rehab Evaluation & Intervention:   Education: Education Goals: Education classes will be provided on a weekly basis, covering required topics. Participant will state understanding/return demonstration of topics presented.  Learning Barriers/Preferences:   Education Topics: General Nutrition Guidelines/Fats and Fiber: -Group instruction provided by verbal, written material, models and posters to present the general guidelines for heart healthy nutrition. Gives an explanation and review of dietary fats and fiber.   Controlling Sodium/Reading Food Labels: -Group verbal and written material supporting the discussion of sodium use in heart healthy nutrition. Review and explanation with models, verbal and written materials for utilization of the food  label.   Exercise Physiology & Risk Factors: - Group verbal and written instruction with models to review the exercise physiology of the cardiovascular system and associated critical values. Details cardiovascular disease risk factors and the goals associated with each risk factor. Flowsheet Row Cardiac Rehab from 02/15/2016 in [facility] Cardiac and Pulmonary Rehab  Date  01/13/16  Educator  AS  Instruction Review Code  2- meets goals/outcomes      Aerobic Exercise & Resistance Training: - Gives group verbal and written discussion on the health impact of inactivity. On the components of aerobic and resistive training programs and the benefits of this training and how to safely progress through these programs. Flowsheet Row Cardiac Rehab from 02/15/2016 in [facility] Cardiac and Pulmonary Rehab  Date  02/01/16  Educator  K. Amedeo Plenty, RN  Instruction Review Code  2- meets goals/outcomes      Flexibility, Balance, General Exercise Guidelines: - Provides group verbal and written instruction on the benefits of flexibility and balance training programs. Provides general exercise guidelines with specific guidelines to those with heart or lung disease. Demonstration and skill practice provided. Flowsheet Row Cardiac Rehab from 02/15/2016 in [facility] Cardiac and Pulmonary Rehab  Date  12/14/15  Educator  [facility]  Instruction Review Code  2- meets goals/outcomes      Stress Management: - Provides group verbal and written instruction about the health risks of elevated stress, cause of high stress, and healthy ways to reduce stress.   Depression: - Provides group verbal and written instruction on the correlation between heart/lung disease and depressed mood, treatment options, and the stigmas associated with seeking treatment.   Anatomy & Physiology of the Heart: - Group verbal and written instruction and models provide basic cardiac anatomy and physiology, with the coronary electrical and arterial systems.  Review of: AMI, Angina, Valve disease, Heart Failure, Cardiac Arrhythmia, Pacemakers, and the ICD. Flowsheet Row Cardiac Rehab from 02/15/2016 in [facility] Cardiac and Pulmonary Rehab  Date  02/08/16  Educator  SB  Instruction Review Code  2- meets goals/outcomes  Cardiac Procedures: - Group verbal and written instruction and models to describe the testing methods done to diagnose heart disease. Reviews the outcomes of the test results. Describes the treatment choices: Medical Management, Angioplasty, or Coronary Bypass Surgery. Flowsheet Row Cardiac Rehab from 02/15/2016 in [facility] Cardiac and Pulmonary Rehab  Date  02/15/16  Educator  CE  Instruction Review Code  2- meets goals/outcomes      Cardiac Medications: - Group verbal and written instruction to review commonly prescribed medications for heart disease. Reviews the medication, class of the drug, and side effects. Includes the steps to properly store meds and maintain the prescription regimen.   Go Sex-Intimacy & Heart Disease, Get SMART - Goal Setting: - Group verbal and written instruction through game format to discuss heart disease and the return to sexual intimacy. Provides group verbal and written material to discuss and apply goal setting through the application of the S.M.A.R.T. Method. Flowsheet Row Cardiac Rehab from 02/15/2016 in [facility] Cardiac and Pulmonary Rehab  Date  02/15/16  Educator  CE  Instruction Review Code  2- meets goals/outcomes      Other Matters of the Heart: - Provides group verbal, written materials and models to describe Heart Failure, Angina, Valve Disease, and Diabetes in the realm of heart disease. Includes description of the disease process and treatment options available to the cardiac patient. Flowsheet Row Cardiac Rehab from 02/15/2016 in [facility] Cardiac and Pulmonary Rehab  Date  02/08/16  Educator  SB  Instruction Review Code  2- meets goals/outcomes      Exercise & Equipment Safety: -  Individual verbal instruction and demonstration of equipment use and safety with use of the equipment. Flowsheet Row Cardiac Rehab from 02/15/2016 in [facility] Cardiac and Pulmonary Rehab  Date  10/05/15  Educator  SB  Instruction Review Code  2- meets goals/outcomes      Infection Prevention: - Provides verbal and written material to individual with discussion of infection control including proper hand washing and proper equipment cleaning during exercise session. Flowsheet Row Cardiac Rehab from 02/15/2016 in [facility] Cardiac and Pulmonary Rehab  Date  10/05/15  Educator  SB  Instruction Review Code  2- meets goals/outcomes      Falls Prevention: - Provides verbal and written material to individual with discussion of falls prevention and safety.   Diabetes: - Individual verbal and written instruction to review signs/symptoms of diabetes, desired ranges of glucose level fasting, after meals and with exercise. Advice that pre and post exercise glucose checks will be done for 3 sessions at entry of program. Flowsheet Row Cardiac Rehab from 02/15/2016 in [facility] Cardiac and Pulmonary Rehab  Date  10/05/15  Educator  SB  Instruction Review Code  2- meets goals/outcomes       Knowledge Questionnaire Score:   Core Components/Risk Factors/Patient Goals at Admission:   Core Components/Risk Factors/Patient Goals Review:      Goals and Risk Factor Review    Row Name 01/04/16 1353 01/21/16 1710           Core Components/Risk Factors/Patient Goals Review   Personal Goals Review Weight Management/Obesity;Increase Strength and Stamina;Lipids;Hypertension;Diabetes Weight Management/Obesity;Diabetes;Sedentary      Review "[name]" said he tries to ride his bicycle in his neighborhood the days he is not attending Cardiac Rehab. He said he still "gets out of air but realizes that the MD told him there are some clogged heart arteries that they can't totally fix". He said his blood pressure has been up  lately and  he is going to get himself a new blood pressure cufff. [name] admits to having a history of back pain and had a injection in his knee last month that increased his blood sugars but he reports his HBA1c is usually about 6.9. [name] said he is not regular enough with his attendance with his Cardiac rehab to get his stamina back but he hopes to do better. I offered the 8am Tuesday /Thursday Cardiac REhab for a make up day but [name] said he is not a morning person. He is also having company from [place] this evening but he hopes to attend Cardiac Rehab this evening. [name] thanked me for my phone call to him to check up with him on an individual basis.  [name] has been having high and low blood sugar episodes.  He has called his Dr but has not yet received a call back.  He was instructed to call again.  His insulin was changed a few weeks ago.  HbA1C was 7.0 at last check.  His weight is down and he has been eating less and riding his bike outdoors most days at home.  he would like to meet with the RD.      Expected Outcomes Cont [place] healthy lifestyle. Increased stamina. Better control of his blood pressure (please see telemetry sheets for his blood pressure).  [name] will get his BG under better control with the help of his Dr. and learning better dietary habits.         Core Components/Risk Factors/Patient Goals at Discharge (Final Review):      Goals and Risk Factor Review - 01/21/16 1710      Core Components/Risk Factors/Patient Goals Review   Personal Goals Review Weight Management/Obesity;Diabetes;Sedentary   Review [name] has been having high and low blood sugar episodes.  He has called his Dr but has not yet received a call back.  He was instructed to call again.  His insulin was changed a few weeks ago.  HbA1C was 7.0 at last check.  His weight is down and he has been eating less and riding his bike outdoors most days at home.  he would like to meet with the RD.   Expected Outcomes [name] will get his BG  under better control with the help of his Dr. and learning better dietary habits.      ITP Comments:     ITP Comments    Row Name 12/01/15 1537 12/16/15 0659 01/13/16 0628 01/14/16 1753 02/10/16 0626   ITP Comments ITP ready for initial signature.  Documentation of diagnosis found in Office Note 11/23/2015 [facility] 30 day review. Continue with ITP unless changes noted by Medical Director at signature of review. Has started classes in this month 30 day review completed for Medical Director physician review and signature. Continue ITP unless changes made by physician. intermittent attendance personal reasons After exercise Diandre was not feeling well and his blood sugar dropped to 51 mg/dL. He was given 4 glucose tabs and his blood sugar went up to 93 mg/dL. He ate PB crackers before leaving to go home and have dinner.  30 day review completed for review by Dr Emily Filbert.  Continue with ITP unless changes noted by Dr Sabra Heck.   [place] Name 03/04/16 1024 03/08/16 0635 04/06/16 0642       ITP Comments Called to check on [name].  He has been out with cold symptoms and hacking cough for two weeks.  He is planning to call the doctor about it  again. 30 day review. Continue with ITP unless directed changes per Medical Director review. 30 day review. Continue with ITP unless directed changes per Medical Director review. LAst visit 02/15/2016        Comments:

## 2016-04-11 ENCOUNTER — Encounter: Payer: Medicare Other | Attending: Cardiovascular Disease | Admitting: *Deleted

## 2016-04-11 DIAGNOSIS — I5032 Chronic diastolic (congestive) heart failure: Secondary | ICD-10-CM | POA: Diagnosis not present

## 2016-04-11 DIAGNOSIS — I208 Other forms of angina pectoris: Secondary | ICD-10-CM

## 2016-04-11 DIAGNOSIS — Z951 Presence of aortocoronary bypass graft: Secondary | ICD-10-CM | POA: Diagnosis not present

## 2016-04-11 NOTE — Progress Notes (Signed)
Daily Session Note  Patient Details  Name: [name] [name] MRN: [id number] Date of Birth: [birth year] Referring Provider:   Flowsheet Row Cardiac Rehab from 10/05/2015 in [facility] Cardiac and Pulmonary Rehab  Referring Provider  Ida Rogue MD      Encounter Date: 04/11/2016  Check In:     Session Check In - 04/11/16 1729      Check-In   Location [facility]-Cardiac & Pulmonary Rehab   Staff Present Gerlene Burdock, RN, BSN;Mary Kellie Shropshire, RN, BSN, [place], BS, ACSM CEP, Exercise Physiologist   Supervising physician immediately available to respond to emergencies See telemetry face sheet for immediately available ER MD   Medication changes reported     No   Fall or balance concerns reported    No   Warm-up and Cool-down Performed on first and last piece of equipment   Resistance Training Performed No   VAD Patient? No     Pain Assessment   Currently in Pain? No/denies         Goals Met:  Proper associated with RPD/PD & O2 Sat Exercise tolerated well  Goals Unmet:  Not Applicable  Comments:     Dr. Emily Filbert is Medical Director for [facility].

## 2016-04-13 DIAGNOSIS — I208 Other forms of angina pectoris: Secondary | ICD-10-CM

## 2016-04-13 DIAGNOSIS — Z951 Presence of aortocoronary bypass graft: Secondary | ICD-10-CM | POA: Diagnosis not present

## 2016-04-13 DIAGNOSIS — I5032 Chronic diastolic (congestive) heart failure: Secondary | ICD-10-CM | POA: Diagnosis not present

## 2016-04-13 NOTE — Progress Notes (Signed)
Cardiac Individual Treatment Plan  Patient Details  Name: [name] [name] MRN: [id number] Date of Birth: 1933/04/04 Referring Provider:   Flowsheet Row Cardiac Rehab from 10/05/2015 in [facility] Cardiac and Pulmonary Rehab  Referring Provider  Ida Rogue MD      Initial Encounter Date:  Flowsheet Row Cardiac Rehab from 10/05/2015 in [facility] Cardiac and Pulmonary Rehab  Date  10/05/15  Referring Provider  Ida Rogue MD      Visit Diagnosis: Stable angina [facility])  Patient's Home Medications on Admission:  Current Outpatient Prescriptions:  .  acetaminophen (TYLENOL) 650 MG CR tablet, Take 650 mg by mouth every 8 (eight) hours as needed for pain. For hand arthritis, Disp: , Rfl:  .  albuterol (PROVENTIL HFA;VENTOLIN HFA) 108 (90 Base) MCG/ACT inhaler, Inhale 2 puffs into the lungs every 4 (four) hours as needed for wheezing or shortness of breath., Disp: 1 Inhaler, Rfl: 0 .  aspirin 81 MG tablet, Take 162 mg by mouth daily. , Disp: , Rfl:  .  atorvastatin (LIPITOR) 40 MG tablet, Take 1 tablet (40 mg total) by mouth every other day., Disp: 90 tablet, Rfl: 3 .  BD INSULIN SYRINGE ULTRAFINE 31G X 15/64" 0.3 ML MISC, USE AS DIRECTED, Disp: 100 each, Rfl: 3 .  Biotin 1000 MCG tablet, Take 1,000 mcg by mouth daily., Disp: , Rfl:  .  carvedilol (COREG) 12.5 MG tablet, TAKE ONE TABLET TWICE A DAY WITH MEALS., Disp: 180 tablet, Rfl: 3 .  finasteride (PROSCAR) 5 MG tablet, Take 1 tablet (5 mg total) by mouth daily., Disp: 90 tablet, Rfl: 1 .  furosemide (LASIX) 20 MG tablet, Take 1 tablet (20 mg total) by mouth 2 (two) times daily as needed. (Patient taking differently: Take 20 mg by mouth daily. ), Disp: 60 tablet, Rfl: 6 .  glimepiride (AMARYL) 1 MG tablet, TAKE 1/2 TABLET AM DAILY., Disp: , Rfl:  .  LANTUS 100 UNIT/ML injection, Inject 18 Units into the skin 2 (two) times daily. , Disp: , Rfl:  .  meloxicam (MOBIC) 15 MG tablet, Take 15 mg by mouth daily. , Disp: , Rfl:  .  omeprazole  (PRILOSEC) 20 MG capsule, TAKE 1 CAPSULE BY MOUTH TWO TIMES DAILY, Disp: 60 capsule, Rfl: 11 .  potassium chloride (K-DUR,KLOR-CON) 10 MEQ tablet, TAKE 1 TABLET BY MOUTH DAILY, Disp: 30 tablet, Rfl: 9 .  predniSONE (DELTASONE) 20 MG tablet, Take 2 tablets (40 mg total) by mouth daily., Disp: 8 tablet, Rfl: 0 .  ramipril (ALTACE) 10 MG capsule, Take 1 capsule (10 mg total) by mouth daily., Disp: 90 capsule, Rfl: 3 .  traMADol (ULTRAM) 50 MG tablet, Take 50 mg by mouth as needed. , Disp: , Rfl:   Past Medical History: Past Medical History:  Diagnosis Date  . Cataract   . Coronary artery disease    a. 4v CABG 01/2005; b. cath 06/05/13 showed patent grafts: LIMA-LAD, VG-D, VG-OM3, VG-dRCA, nl filling pressures, nl LV fxn  . Diastolic dysfunction    a. echo 05/1495: nl systolic fxn, mild LVH, diastolic relaxation abnormality, mildly enlarged LA, mild Ao insufficiency  . ED (erectile dysfunction)   . GERD (gastroesophageal reflux disease)   . Hyperlipidemia   . Hypertension   . Hypertrophy of prostate without urinary obstruction and other lower urinary tract symptoms (LUTS)   . IDDM (insulin dependent diabetes mellitus) ([place]) 2000  . Osteoarthritis   . Perirectal fistula   . Tubular adenoma of colon 07/2012    Tobacco Use: History  Smoking Status  . Never Smoker  Smokeless Tobacco  . Never Used    Labs: Recent Review Flowsheet Data    Labs for ITP Cardiac and Pulmonary Rehab Latest Ref Rng & Units 07/30/2013 11/05/2013 07/04/2014 11/07/2014 02/06/2015   Cholestrol 0 - 200 mg/dL 112 - 172 - 111   LDLCALC 0 - 99 mg/dL 61 - 110(H) - 61   HDL >39.00 mg/dL 39(L) - 39(L) - 31.90(L)   Trlycerides 0.0 - 149.0 mg/dL 59 - 115 - 92.0   Hemoglobin A1c 4.0 - 6.0 % - 6.5(A) - 7.0(A) -   PHART 7.350 - 7.450 - - - - -   PCO2ART 35.0 - 45.0 mmHg - - - - -   HCO3 20.0 - 24.0 mEq/L - - - - -   TCO2 0 - 100 mmol/L - - - - -   O2SAT % - - - - -       Exercise Target Goals:    Exercise Program  Goal: Individual exercise prescription set with THRR, safety & activity barriers. Participant demonstrates ability to understand and report RPE using BORG scale, to self-measure pulse accurately, and to acknowledge the importance of the exercise prescription.  Exercise Prescription Goal: Starting with aerobic activity 30 plus minutes a day, 3 days per week for initial exercise prescription. Provide home exercise prescription and guidelines that participant acknowledges understanding prior to discharge.  Activity Barriers & Risk Stratification:   6 Minute Walk:   Initial Exercise Prescription:   Perform Capillary Blood Glucose checks as needed.  Exercise Prescription Changes:     Exercise Prescription Changes    Row Name 12/24/15 1200 01/06/16 1200 01/21/16 1100 02/03/16 1400 02/18/16 1000     Exercise Review   Progression  - Yes Yes  - Yes     Response to Exercise   Blood Pressure (Admit) 168/80  - 140/68 170/80 130/62   Blood Pressure (Exercise) 138/78 186/70 160/74 140/80 148/82   Blood Pressure (Exit) 132/72 124/72 130/72 118/60 132/70   Heart Rate (Admit) 58 bpm 79 bpm 77 bpm 64 bpm 75 bpm   Heart Rate (Exercise) 92 bpm 103 bpm 122 bpm 107 bpm 85 bpm   Heart Rate (Exit) 66 bpm 79 bpm 61 bpm 77 bpm 72 bpm   Rating of Perceived Exertion (Exercise) _0 Duration Progress to 45 minutes of aerobic exercise without signs/symptoms of physical distress Progress to 45 minutes of aerobic exercise without signs/symptoms of physical distress Progress to 45 minutes of aerobic exercise without signs/symptoms of physical distress Progress to 45 minutes of aerobic exercise without signs/symptoms of physical distress Progress to 45 minutes of aerobic exercise without signs/symptoms of physical distress   Intensity _1      Progression   Progression  - Continue to progress workloads to maintain intensity without  signs/symptoms of physical distress. Continue to progress workloads to maintain intensity without signs/symptoms of physical distress. Continue to progress workloads to maintain intensity without signs/symptoms of physical distress. Continue to progress workloads to maintain intensity without signs/symptoms of physical distress.   Average METs  - 1.97 2.4 3.4 2.13     Resistance Training   Training Prescription _2    Weight _3 Reps 10-12 10-15 10-15  - 10-15     Interval Training   Interval Training No No No  - No     Treadmill  MPH 1.4 1.4 2.5 2.5  -   Grade 0 0 0 0  -   Minutes _0 -   METs 2.07 2.07 2.91 2.91  -     Bike   Level  -  - 0.5  - 0.5   Minutes  -  - 15  - 15   METs  -  - 1.86  - 1.86     Recumbant Elliptical   Level 1  -  -  -  -   Minutes 15  -  -  -  -     T5 Nustep   Level  -  -  - 1 1   Minutes  -  -  - 15 15   METs  -  -  - 3.9 2.4      Exercise Comments:     Exercise Comments    Row Name 12/14/15 1828 12/24/15 1212 01/06/16 1254 01/21/16 1147 02/03/16 1409   Exercise Comments First full day of exercise! [name] was oriented to gym and equipment including functions, settings, policies, and procedures. [name]'s individual exercise prescription and treatment plan were reviewed.  All starting workloads were established based on the results of the 6 minute walk test done at initial orientation visit.  The plan for exercise progression was also introduced and progression will be customized based on patient's performance and goals. Mr Krauter is progressing well with exercise. Mr Icenogle is doing well with exercise. Mr Ohms is doing well with exercise. [name] [name] is progressing well with exercise.   [place] Name 02/18/16 1058           Exercise Comments [name] [name] has done well with exercise and staff will work with him to increase RPE levels.          Discharge Exercise Prescription (Final Exercise Prescription Changes):     Exercise  Prescription Changes - 02/18/16 1000      Exercise Review   Progression Yes     Response to Exercise   Blood Pressure (Admit) 130/62   Blood Pressure (Exercise) 148/82   Blood Pressure (Exit) 132/70   Heart Rate (Admit) 75 bpm   Heart Rate (Exercise) 85 bpm   Heart Rate (Exit) 72 bpm   Rating of Perceived Exertion (Exercise) 11   Duration Progress to 45 minutes of aerobic exercise without signs/symptoms of physical distress   Intensity THRR unchanged     Progression   Progression Continue to progress workloads to maintain intensity without signs/symptoms of physical distress.   Average METs 2.13     Resistance Training   Training Prescription Yes   Weight 3   Reps 10-15     Interval Training   Interval Training No     Bike   Level 0.5   Minutes 15   METs 1.86     T5 Nustep   Level 1   Minutes 15   METs 2.4      Nutrition:  Target Goals: Understanding of nutrition guidelines, daily intake of sodium <154m, cholesterol <2069m calories 30% from fat and 7% or less from saturated fats, daily to have 5 or more servings of fruits and vegetables.  Biometrics:    Nutrition Therapy Plan and Nutrition Goals:     Nutrition Therapy & Goals - 01/04/16 1342      Nutrition Therapy   Diet Diabetes diet (takes 2 insulin injections a day)    Drug/Food Interactions Statins/Certain Fruits     Personal Nutrition Goals  Comments "[name] [name]" said he knows what to eat.       Nutrition Discharge: Rate Your Plate Scores:     Nutrition Assessments - 10/23/15 1428      Rate Your Plate Scores   Pre Score 58   Pre Score % 64 %      Nutrition Goals Re-Evaluation:     Nutrition Goals Re-Evaluation    Row Name 01/04/16 1352 04/13/16 1704           Personal Goal #1 Re-Evaluation   Personal Goal #1 Heart healthy diabetic diet Prefers not to meet individually with the registered dietician. He is eating a heart healthy-diabetic diet. His HBA1c is 6.9       Goal Progress Seen  Yes  -      Comments "[name] [name]" Sava said he would prefer not to meet individually with the Cardiac Rehab Registered Dietician. [name] [name] said "I am 82 and set in my ways somedays but my blood sugar this am was 103 and yesterday 133.  "[name] [name]" said he made a mistake and used regular cough drops so his blood sugars where up in the 200's but he walked around at home and got it a little lower.         Intervention Plan   Intervention Continue to educate, counsel and set short/long term goals regarding individualized specific personal dietary modifications.  -         Psychosocial: Target Goals: Acknowledge presence or absence of depression, maximize coping skills, provide positive support system. Participant is able to verbalize types and ability to use techniques and skills needed for reducing stress and depression.  Initial Review & Psychosocial Screening:   Quality of Life Scores:   PHQ-9: Recent Review Flowsheet Data    Depression screen [facility] 2/9 10/05/2015 02/06/2015 02/05/2014   Decreased Interest 0 0 0   Down, Depressed, Hopeless 0 0 0   PHQ - 2 Score 0 0 0   Altered sleeping 1 - -   Tired, decreased energy 2 - -   Change in appetite 2 - -   Feeling bad or failure about yourself  0 - -   Trouble concentrating 0 - -   Moving slowly or fidgety/restless 0 - -   Suicidal thoughts 0 - -   PHQ-9 Score 5 - -   Difficult doing work/chores Not difficult at all - -      Psychosocial Evaluation and Intervention:     Psychosocial Evaluation - 02/01/16 1733      Psychosocial Evaluation & Interventions   Comments Counselor met with Mr. Ruppe [facility]) today for initial psychosocial evaluation.  He is an 82 yr old who was in this program 11 years ago when he had 5 by-passes done.  He has blockages now and reports Drs are unable to do stents so ordered this program again.  [name] [name] has a strong support system with a spouse of (6) years; an adult child close by and active involvement in his local church.  He  has arthritis and type 2 diabetes which has been a challenge for him lately as his Dr. Riley Churches his insulin prescription 2 months ago and he has had more ups and downs with his sugar levels.  He also reports he is not sleeping great over the past year with waking up and difficulty getting back to sleep.  He is tired as a result and has decreased appetite as well.  [name] [name] denies a history of depression or anxiety and states he  is typically in a positive mood.  He has goals to "increase circulation and open up those blockages" with exercise.  He would like to return to riding his bike outside 30-60 mins 3-5X per week once he completes this program.   Staff will be following with [name] [name] throughout the course of this program.     Continued Psychosocial Services Needed Yes  [name] [name] may need to be seen by a medical provider for his insulin prescription and his sleep challenges which are impacting his overall health.  Staff will follow      Psychosocial Re-Evaluation:     Psychosocial Re-Evaluation    [place] Name 01/04/16 1359 04/13/16 1717           Psychosocial Re-Evaluation   Interventions Encouraged to attend Cardiac Rehabilitation for the exercise  -      Comments "[name] [name]" Shirline Frees said "I try not to get stressed too much". We discussed how riding his bicycle in his neighborhood is good for exercise and to decrease any stress.  [name] [name] said he tries to smile alot and at age 66 just tries to not worry and be happy.          Vocational Rehabilitation: Provide vocational rehab assistance to qualifying candidates.   Vocational Rehab Evaluation & Intervention:   Education: Education Goals: Education classes will be provided on a weekly basis, covering required topics. Participant will state understanding/return demonstration of topics presented.  Learning Barriers/Preferences:   Education Topics: General Nutrition Guidelines/Fats and Fiber: -Group instruction provided by verbal, written material, models  and posters to present the general guidelines for heart healthy nutrition. Gives an explanation and review of dietary fats and fiber.   Controlling Sodium/Reading Food Labels: -Group verbal and written material supporting the discussion of sodium use in heart healthy nutrition. Review and explanation with models, verbal and written materials for utilization of the food label.   Exercise Physiology & Risk Factors: - Group verbal and written instruction with models to review the exercise physiology of the cardiovascular system and associated critical values. Details cardiovascular disease risk factors and the goals associated with each risk factor. Flowsheet Row Cardiac Rehab from 04/13/2016 in [facility] Cardiac and Pulmonary Rehab  Date  01/13/16  Educator  AS  Instruction Review Code  2- meets goals/outcomes      Aerobic Exercise & Resistance Training: - Gives group verbal and written discussion on the health impact of inactivity. On the components of aerobic and resistive training programs and the benefits of this training and how to safely progress through these programs. Flowsheet Row Cardiac Rehab from 04/13/2016 in [facility] Cardiac and Pulmonary Rehab  Date  02/01/16  Educator  K. Amedeo Plenty, RN  Instruction Review Code  2- meets goals/outcomes      Flexibility, Balance, General Exercise Guidelines: - Provides group verbal and written instruction on the benefits of flexibility and balance training programs. Provides general exercise guidelines with specific guidelines to those with heart or lung disease. Demonstration and skill practice provided. Flowsheet Row Cardiac Rehab from 04/13/2016 in [facility] Cardiac and Pulmonary Rehab  Date  12/14/15  Educator  [facility]  Instruction Review Code  2- meets goals/outcomes      Stress Management: - Provides group verbal and written instruction about the health risks of elevated stress, cause of high stress, and healthy ways to reduce stress. Flowsheet Row Cardiac  Rehab from 04/13/2016 in [facility] Cardiac and Pulmonary Rehab  Date  04/13/16  Educator  [facility]  Instruction Review Code  2-  meets goals/outcomes      Depression: - Provides group verbal and written instruction on the correlation between heart/lung disease and depressed mood, treatment options, and the stigmas associated with seeking treatment.   Anatomy & Physiology of the Heart: - Group verbal and written instruction and models provide basic cardiac anatomy and physiology, with the coronary electrical and arterial systems. Review of: AMI, Angina, Valve disease, Heart Failure, Cardiac Arrhythmia, Pacemakers, and the ICD. Flowsheet Row Cardiac Rehab from 04/13/2016 in [facility] Cardiac and Pulmonary Rehab  Date  02/08/16  Educator  SB  Instruction Review Code  2- meets goals/outcomes      Cardiac Procedures: - Group verbal and written instruction and models to describe the testing methods done to diagnose heart disease. Reviews the outcomes of the test results. Describes the treatment choices: Medical Management, Angioplasty, or Coronary Bypass Surgery. Flowsheet Row Cardiac Rehab from 04/13/2016 in [facility] Cardiac and Pulmonary Rehab  Date  02/15/16  Educator  CE  Instruction Review Code  2- meets goals/outcomes      Cardiac Medications: - Group verbal and written instruction to review commonly prescribed medications for heart disease. Reviews the medication, class of the drug, and side effects. Includes the steps to properly store meds and maintain the prescription regimen.   Go Sex-Intimacy & Heart Disease, Get SMART - Goal Setting: - Group verbal and written instruction through game format to discuss heart disease and the return to sexual intimacy. Provides group verbal and written material to discuss and apply goal setting through the application of the S.M.A.R.T. Method. Flowsheet Row Cardiac Rehab from 04/13/2016 in [facility] Cardiac and Pulmonary Rehab  Date  02/15/16  Educator  CE  Instruction  Review Code  2- meets goals/outcomes      Other Matters of the Heart: - Provides group verbal, written materials and models to describe Heart Failure, Angina, Valve Disease, and Diabetes in the realm of heart disease. Includes description of the disease process and treatment options available to the cardiac patient. Flowsheet Row Cardiac Rehab from 04/13/2016 in [facility] Cardiac and Pulmonary Rehab  Date  04/11/16  Educator  C. Tiarah Shisler, RN  Instruction Review Code  2- meets goals/outcomes      Exercise & Equipment Safety: - Individual verbal instruction and demonstration of equipment use and safety with use of the equipment. Flowsheet Row Cardiac Rehab from 04/13/2016 in [facility] Cardiac and Pulmonary Rehab  Date  10/05/15  Educator  SB  Instruction Review Code  2- meets goals/outcomes      Infection Prevention: - Provides verbal and written material to individual with discussion of infection control including proper hand washing and proper equipment cleaning during exercise session. Flowsheet Row Cardiac Rehab from 04/13/2016 in [facility] Cardiac and Pulmonary Rehab  Date  10/05/15  Educator  SB  Instruction Review Code  2- meets goals/outcomes      Falls Prevention: - Provides verbal and written material to individual with discussion of falls prevention and safety.   Diabetes: - Individual verbal and written instruction to review signs/symptoms of diabetes, desired ranges of glucose level fasting, after meals and with exercise. Advice that pre and post exercise glucose checks will be done for 3 sessions at entry of program. [place] from 04/13/2016 in [facility] Cardiac and Pulmonary Rehab  Date  10/05/15  Educator  SB  Instruction Review Code  2- meets goals/outcomes       Knowledge Questionnaire Score:   Core Components/Risk Factors/Patient Goals at Admission:   Core Components/Risk  Factors/Patient Goals Review:      Goals and Risk Factor Review    Row Name  01/04/16 1353 01/21/16 1710 04/13/16 1715         Core Components/Risk Factors/Patient Goals Review   Personal Goals Review Weight Management/Obesity;Increase Strength and Stamina;Lipids;Hypertension;Diabetes Weight Management/Obesity;Diabetes;Sedentary Diabetes;Weight Management/Obesity;Sedentary;Lipids     Review "[name] [name]" said he tries to ride his bicycle in his neighborhood the days he is not attending Cardiac Rehab. He said he still "gets out of air but realizes that the MD told him there are some clogged heart arteries that they can't totally fix". He said his blood pressure has been up lately and he is going to get himself a new blood pressure cufff. [name] [name] admits to having a history of back pain and had a injection in his knee last month that increased his blood sugars but he reports his HBA1c is usually about 6.9. [name] [name] said he is not regular enough with his attendance with his Cardiac rehab to get his stamina back but he hopes to do better. I offered the 8am Tuesday /Thursday Cardiac REhab for a make up day but [name] [name] said he is not a morning person. He is also having company from [place] this evening but he hopes to attend Cardiac Rehab this evening. [name] [name] thanked me for my phone call to him to check up with him on an individual basis.  [name] [name] has been having high and low blood sugar episodes.  He has called his Dr but has not yet received a call back.  He was instructed to call again.  His insulin was changed a few weeks ago.  HbA1C was 7.0 at last check.  His weight is down and he has been eating less and riding his bike outdoors most days at home.  he would like to meet with the RD. [name] [name] said he lost from 15 lbs and his goal is still 200lbs. He admits when he was sick with his bad cough he didn't exercise plus he rides his bicycle alot and is hoping for warm weather soon. [name] [name] said he eats healthy most of the time to keep his cholestrol level good.      Expected Outcomes Cont [place] healthy lifestyle. Increased  stamina. Better control of his blood pressure (please see telemetry sheets for his blood pressure).  [name] [name] will get his BG under better control with the help of his Dr. and learning better dietary habits. Cont heart healthy diabetic lifestyle.         Core Components/Risk Factors/Patient Goals at Discharge (Final Review):      Goals and Risk Factor Review - 04/13/16 1715      Core Components/Risk Factors/Patient Goals Review   Personal Goals Review Diabetes;Weight Management/Obesity;Sedentary;Lipids   Review [name] [name] said he lost from 15 lbs and his goal is still 200lbs. He admits when he was sick with his bad cough he didn't exercise plus he rides his bicycle alot and is hoping for warm weather soon. [name] [name] said he eats healthy most of the time to keep his cholestrol level good.    Expected Outcomes Cont heart healthy diabetic lifestyle.       ITP Comments:     ITP Comments    Row Name 12/01/15 1537 12/16/15 0659 01/13/16 0628 01/14/16 1753 02/10/16 0626   ITP Comments ITP ready for initial signature.  Documentation of diagnosis found in Office Note 11/23/2015 [facility] 30 day review. Continue with ITP unless changes noted by Medical Director at signature of review. Has  started classes in this month 30 day review completed for Medical Director physician review and signature. Continue ITP unless changes made by physician. intermittent attendance personal reasons After exercise Vernard was not feeling well and his blood sugar dropped to 51 mg/dL. He was given 4 glucose tabs and his blood sugar went up to 93 mg/dL. He ate PB crackers before leaving to go home and have dinner.  30 day review completed for review by Dr Emily Filbert.  Continue with ITP unless changes noted by Dr Sabra Heck.   [place] Name 03/04/16 1024 03/08/16 0635 04/06/16 0642       ITP Comments Called to check on [name] [name].  He has been out with cold symptoms and hacking cough for two weeks.  He is planning to call the doctor about it again. 30 day review.  Continue with ITP unless directed changes per Medical Director review. 30 day review. Continue with ITP unless directed changes per Medical Director review. LAst visit 02/15/2016        Comments:  [name] [name] is glad to be back in Cardiac Rehab after he was out a lot with his bad cough over Christmas.

## 2016-04-13 NOTE — Progress Notes (Signed)
Daily Session Note  Patient Details  Name: [name] [name] MRN: [id number] Date of Birth: 11-25-1933 Referring Provider:   Flowsheet Row Cardiac Rehab from 10/05/2015 in [facility] Cardiac and Pulmonary Rehab  Referring Provider  Ida Rogue MD      Encounter Date: 04/13/2016  Check In:     Session Check In - 04/13/16 1634      Check-In   Location [facility]-Cardiac & Pulmonary Rehab   Staff Present Levell July RN BSN;Ashleen Demma, RN, Vickki Hearing, BA, ACSM CEP, Exercise Physiologist   Supervising physician immediately available to respond to emergencies See telemetry face sheet for immediately available ER MD   Medication changes reported     No   Fall or balance concerns reported    No   Warm-up and Cool-down Performed on first and last piece of equipment   Resistance Training Performed Yes   VAD Patient? No     Pain Assessment   Currently in Pain? No/denies   Multiple Pain Sites No         Goals Met No chest pain. Tolerated exercise  Goals Unmet:  Not Applicable  Comments:     Dr. Emily Filbert is Medical Director for [place] and [facility].

## 2016-04-13 NOTE — Progress Notes (Signed)
Daily Session Note  Patient Details  Name: [name] [name] MRN: [id number] Date of Birth: 1933-08-18 Referring Provider:   Flowsheet Row Cardiac Rehab from 10/05/2015 in [facility] Cardiac and Pulmonary Rehab  Referring Provider  Ida Rogue MD      Encounter Date: 04/13/2016  Check In:     Session Check In - 04/13/16 1634      Check-In   Location [facility]-Cardiac & Pulmonary Rehab   Staff Present Levell July RN BSN;Alishia Lebo, RN, Vickki Hearing, BA, ACSM CEP, Exercise Physiologist   Supervising physician immediately available to respond to emergencies See telemetry face sheet for immediately available ER MD   Medication changes reported     No   Fall or balance concerns reported    No   Warm-up and Cool-down Performed on first and last piece of equipment   Resistance Training Performed Yes   VAD Patient? No     Pain Assessment   Currently in Pain? No/denies   Multiple Pain Sites No         Goals Met:  Proper associated with RPD/PD & O2 Sat Exercise tolerated well No report of cardiac concerns or symptoms  Goals Unmet:  Not Applicable  Comments:     Dr. Emily Filbert is Medical Director for [place] and [facility].

## 2016-04-18 ENCOUNTER — Encounter: Payer: Medicare Other | Admitting: *Deleted

## 2016-04-18 DIAGNOSIS — Z951 Presence of aortocoronary bypass graft: Secondary | ICD-10-CM | POA: Diagnosis not present

## 2016-04-18 DIAGNOSIS — I208 Other forms of angina pectoris: Secondary | ICD-10-CM

## 2016-04-18 DIAGNOSIS — I5032 Chronic diastolic (congestive) heart failure: Secondary | ICD-10-CM | POA: Diagnosis not present

## 2016-04-18 NOTE — Progress Notes (Signed)
Daily Session Note  Patient Details  Name: [name] [name] MRN: [id number] Date of Birth: [birth year] Referring Provider:   Flowsheet Row Cardiac Rehab from 10/05/2015 in [facility] Cardiac and Pulmonary Rehab  Referring Provider  Ida Rogue MD      Encounter Date: 04/18/2016  Check In:     Session Check In - 04/18/16 1650      Check-In   Staff Present Nyoka Cowden, RN, BSN, MA;Susanne Bice, RN, BSN, CCRP;Carroll Enterkin, RN, BSN         Goals Met:  Exercise tolerated well No report of cardiac concerns or symptoms Strength training completed today  Goals Unmet:  Not Applicable  Comments: Doing well with exercise prescription progression.    Dr. Emily Filbert is Medical Director for [place] and [facility].

## 2016-05-02 IMAGING — CT CT ABD-PELV W/ CM
1 of 3 series · 14 of 32 positions shown, 19 images · IV contrast (omnipaque)
Comparison: 05/30/2012 abdominal ultrasound. 05/15/2011 abdominal
pelvic CT.

CLINICAL DATA: Right lower quadrant pain for 6 days. Prior
cholecystectomy. Prostate photovaporization.

EXAM:
CT ABDOMEN AND PELVIS WITH CONTRAST
TECHNIQUE: Multidetector CT imaging of the abdomen and pelvis was performed
using the standard protocol following bolus administration of
intravenous contrast.
CONTRAST:  100mL OMNIPAQUE IOHEXOL 300 MG/ML  SOLN

[Series 2: routine abd pel with · axial · 0.87mm/px · z∈[-472,-72]mm · 14 of 90 slices shown, 19 images]
[im 5/90  soft-tissue]
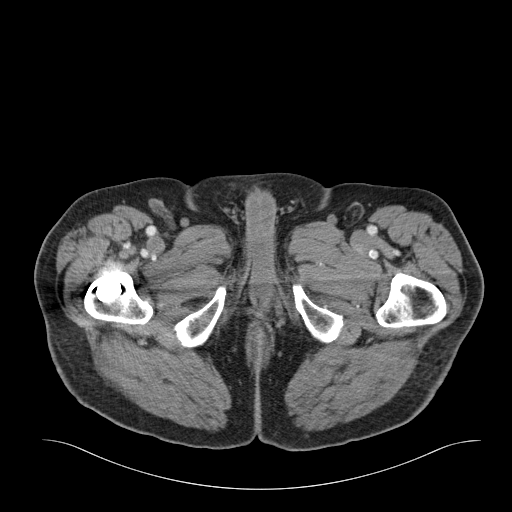
[im 5/90  bone]
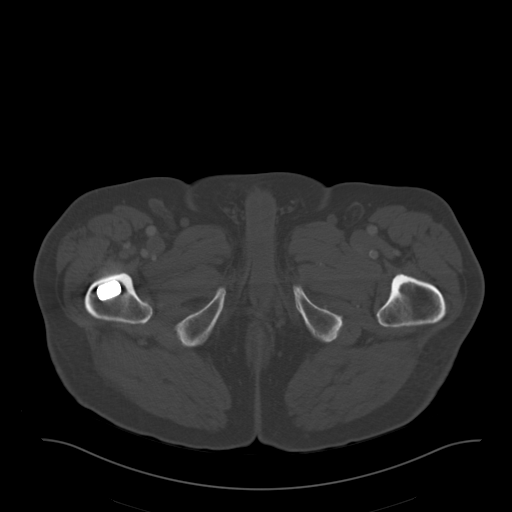
[im 15/90  soft-tissue]
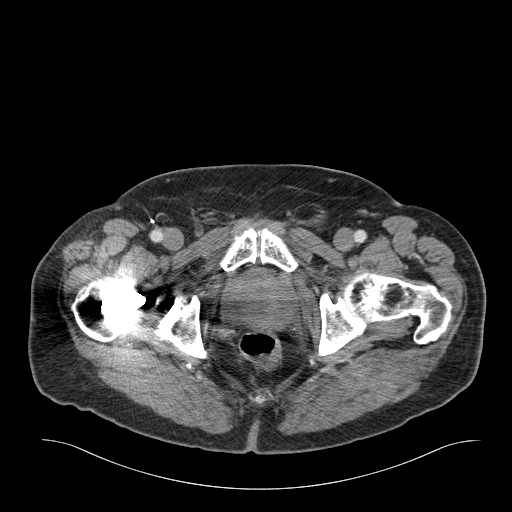
[im 19/90  soft-tissue]
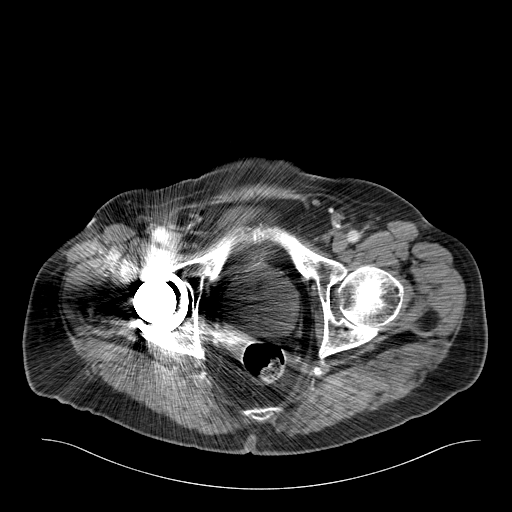
[im 24/90  soft-tissue]
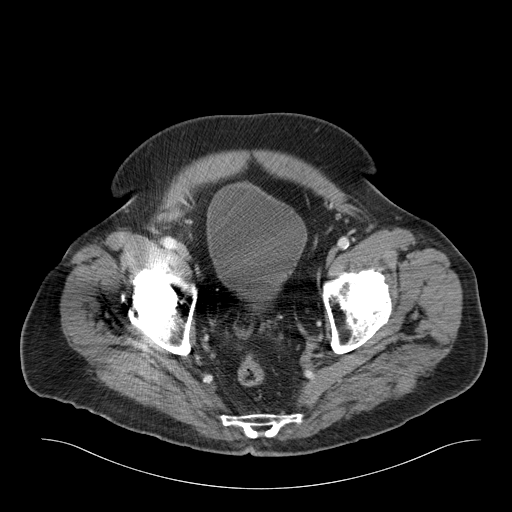
[im 33/90  soft-tissue]
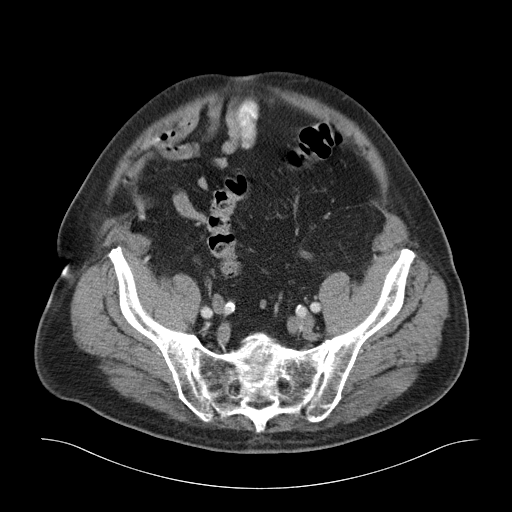
[im 38/90  soft-tissue]
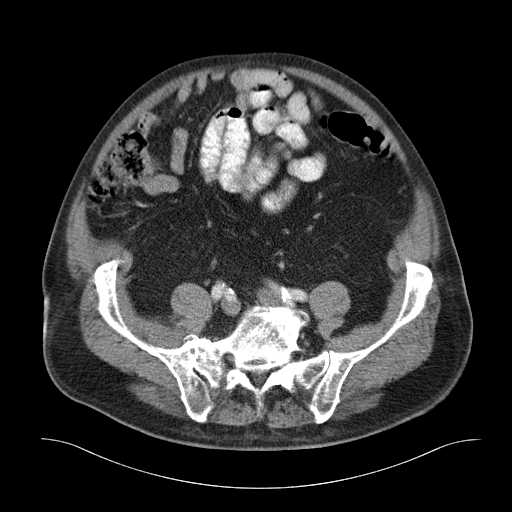
[im 47/90  soft-tissue]
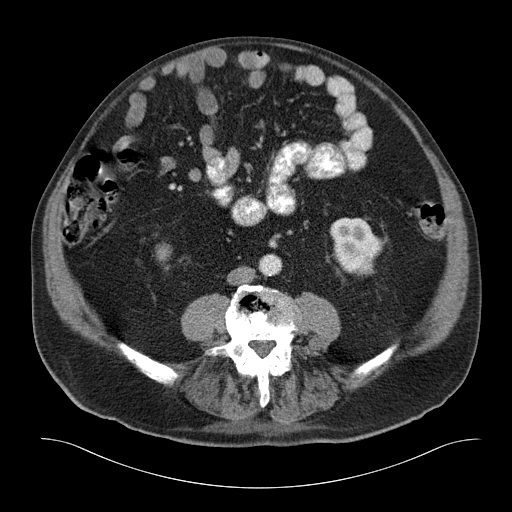
[im 52/90  soft-tissue]
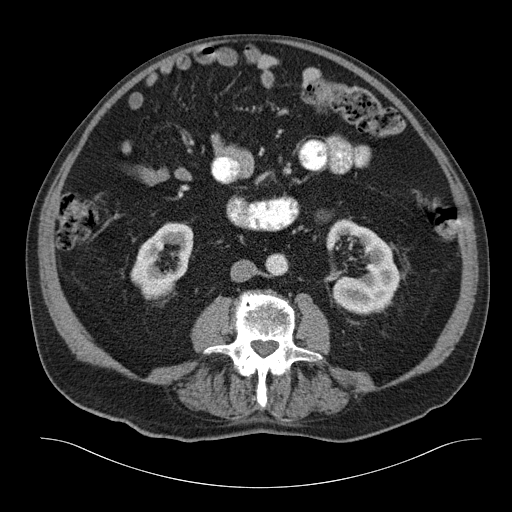
[im 57/90  soft-tissue]
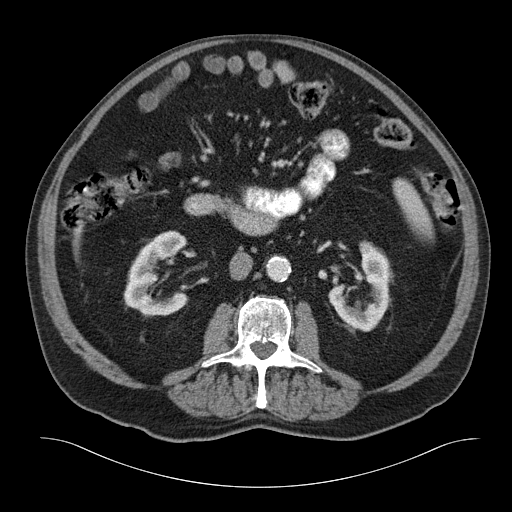
[im 57/90  bone]
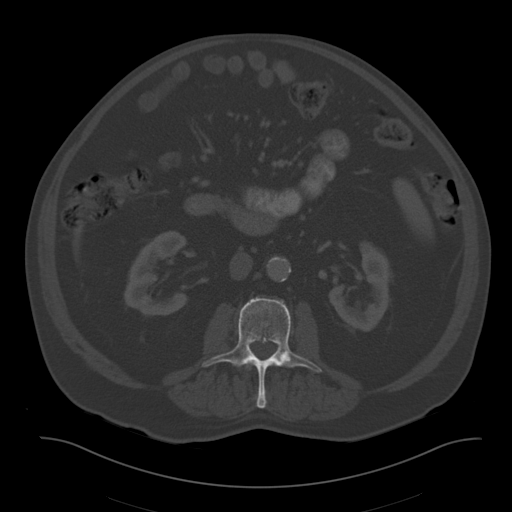
[im 66/90  soft-tissue]
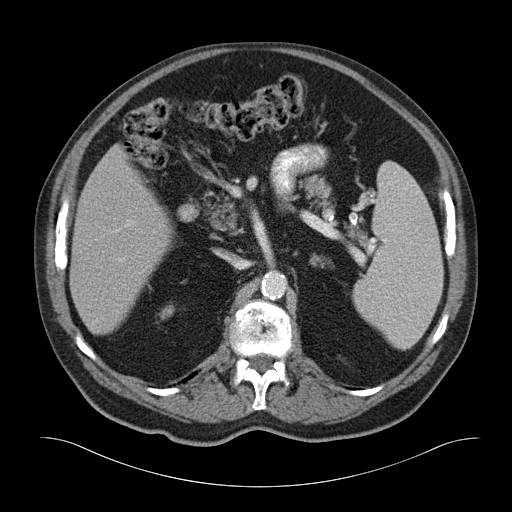
[im 71/90  soft-tissue]
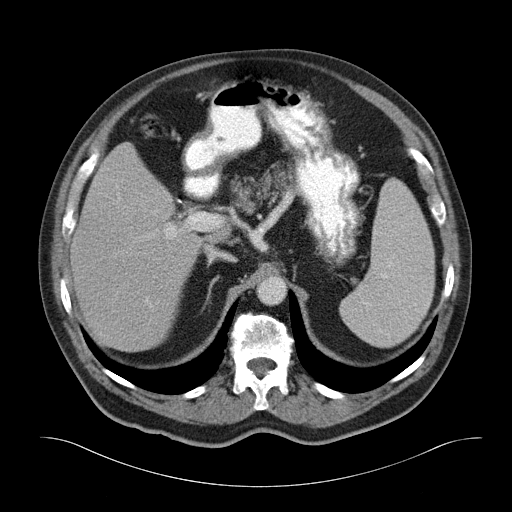
[im 71/90  lung]
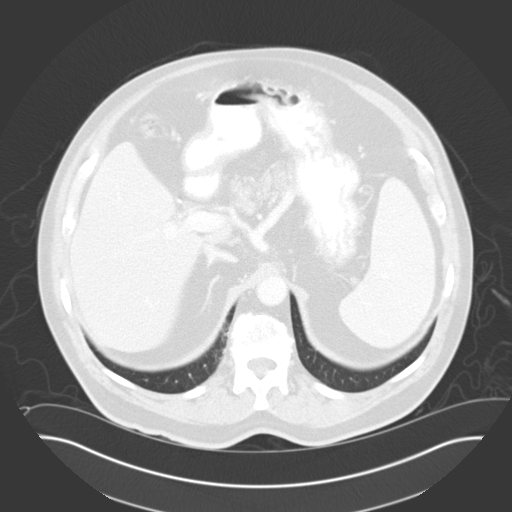
[im 75/90  soft-tissue]
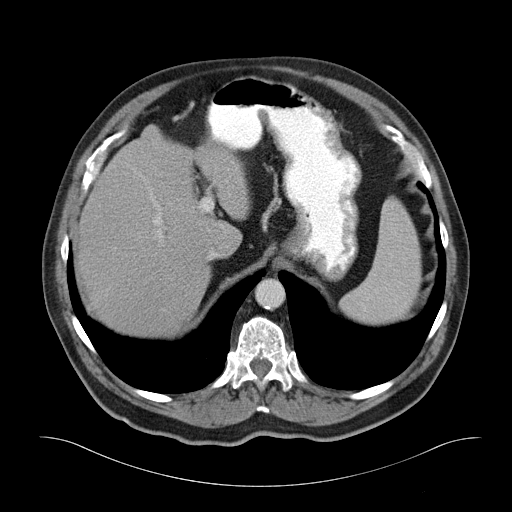
[im 75/90  lung]
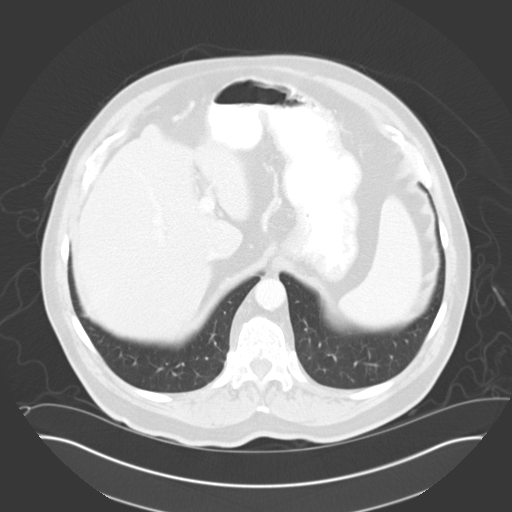
[im 80/90  lung]
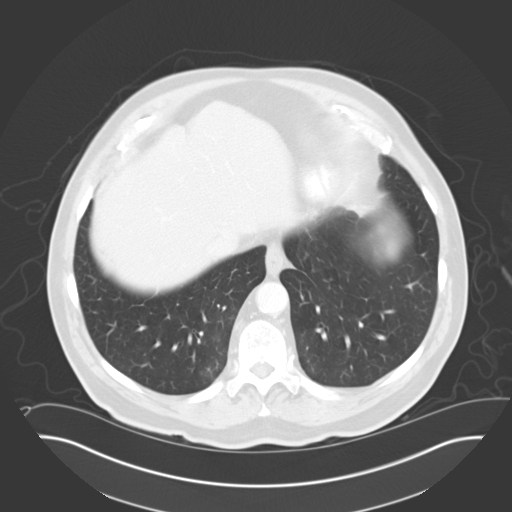
[im 85/90  soft-tissue]
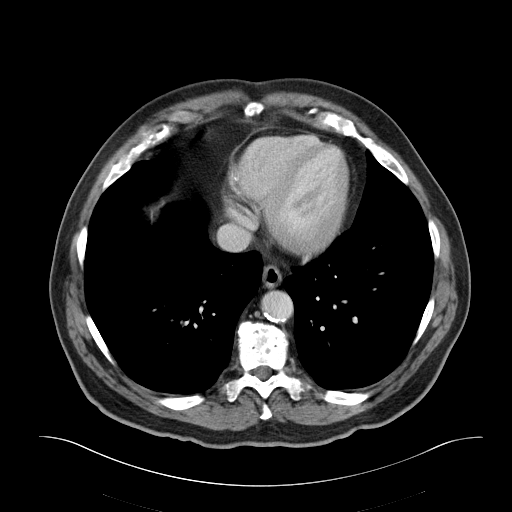
[im 85/90  lung]
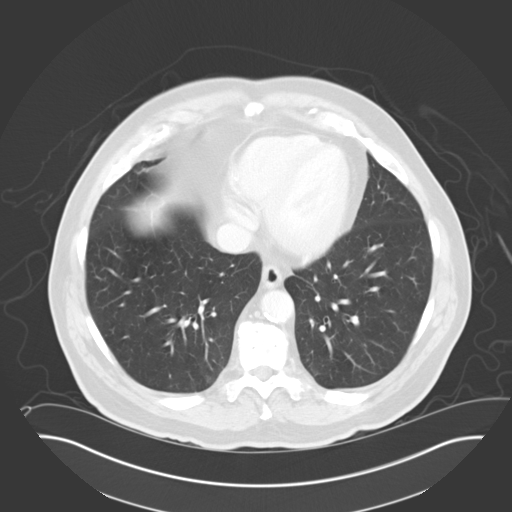

[14 of 32 positions shown; findings below may reference images not displayed]

FINDINGS: Lower chest: Minimal dependent subsegmental atelectasis. Normal
heart size without pericardial or pleural effusion. Right coronary
artery atherosclerosis.

Hepatobiliary: Normal liver. Cholecystectomy, without biliary ductal
dilatation.

Pancreas: Fatty atrophy involving the pancreas. No duct dilatation
or dominant mass.

Spleen: Similar splenomegaly, mild.  14.8 x 7.2 x 11.6 cm.

Adrenals/Urinary Tract: Normal adrenal glands. Left renal cyst. A
too small to characterize lower pole left renal lesion is also
likely a cyst. Normal right kidney, without hydronephrosis or
hydroureter. Degraded evaluation of the pelvis, secondary to beam
hardening artifact from right hip arthroplasty. Grossly normal
urinary bladder.

Stomach/Bowel: Normal stomach, without wall thickening. Normal
colon, appendix, and terminal ileum. Normal small bowel.

Vascular/Lymphatic: Aortic and branch vessel atherosclerosis. No
abdominopelvic adenopathy.

Reproductive: Moderate prostatomegaly.

Other: No significant free fluid.

Musculoskeletal: Right hip arthroplasty. Right sacroiliac joint
degenerative partial fusion. Re- demonstration of a bone island
within the left pedicle of L3. Advanced lumbosacral spondylosis.
IMPRESSION: 1.  No acute process or explanation for right lower quadrant pain.
2. Degraded evaluation of the pelvis, secondary to beam hardening
artifact from right hip arthroplasty.
3. Prostatomegaly.
4. Similar mild splenomegaly.
5.  Atherosclerosis, including within the coronary arteries.

## 2016-05-04 ENCOUNTER — Encounter: Payer: Self-pay | Admitting: *Deleted

## 2016-05-04 DIAGNOSIS — Z794 Long term (current) use of insulin: Secondary | ICD-10-CM | POA: Diagnosis not present

## 2016-05-04 DIAGNOSIS — E119 Type 2 diabetes mellitus without complications: Secondary | ICD-10-CM | POA: Diagnosis not present

## 2016-05-04 DIAGNOSIS — I208 Other forms of angina pectoris: Secondary | ICD-10-CM

## 2016-05-04 DIAGNOSIS — Z951 Presence of aortocoronary bypass graft: Secondary | ICD-10-CM | POA: Insufficient documentation

## 2016-05-04 DIAGNOSIS — I5189 Other ill-defined heart diseases: Secondary | ICD-10-CM | POA: Insufficient documentation

## 2016-05-04 DIAGNOSIS — M1990 Unspecified osteoarthritis, unspecified site: Secondary | ICD-10-CM | POA: Insufficient documentation

## 2016-05-04 DIAGNOSIS — E118 Type 2 diabetes mellitus with unspecified complications: Secondary | ICD-10-CM | POA: Diagnosis not present

## 2016-05-04 NOTE — Progress Notes (Signed)
Cardiac Individual Treatment Plan  Patient Details  Name: [name] [name] MRN: [id number] Date of Birth: 1933/04/04 Referring Provider:   Flowsheet Row Cardiac Rehab from 10/05/2015 in [facility] Cardiac and Pulmonary Rehab  Referring Provider  Ida Rogue MD      Initial Encounter Date:  Flowsheet Row Cardiac Rehab from 10/05/2015 in [facility] Cardiac and Pulmonary Rehab  Date  10/05/15  Referring Provider  Ida Rogue MD      Visit Diagnosis: Stable angina [facility])  Patient's Home Medications on Admission:  Current Outpatient Prescriptions:  .  acetaminophen (TYLENOL) 650 MG CR tablet, Take 650 mg by mouth every 8 (eight) hours as needed for pain. For hand arthritis, Disp: , Rfl:  .  albuterol (PROVENTIL HFA;VENTOLIN HFA) 108 (90 Base) MCG/ACT inhaler, Inhale 2 puffs into the lungs every 4 (four) hours as needed for wheezing or shortness of breath., Disp: 1 Inhaler, Rfl: 0 .  aspirin 81 MG tablet, Take 162 mg by mouth daily. , Disp: , Rfl:  .  atorvastatin (LIPITOR) 40 MG tablet, Take 1 tablet (40 mg total) by mouth every other day., Disp: 90 tablet, Rfl: 3 .  BD INSULIN SYRINGE ULTRAFINE 31G X 15/64" 0.3 ML MISC, USE AS DIRECTED, Disp: 100 each, Rfl: 3 .  Biotin 1000 MCG tablet, Take 1,000 mcg by mouth daily., Disp: , Rfl:  .  carvedilol (COREG) 12.5 MG tablet, TAKE ONE TABLET TWICE A DAY WITH MEALS., Disp: 180 tablet, Rfl: 3 .  finasteride (PROSCAR) 5 MG tablet, Take 1 tablet (5 mg total) by mouth daily., Disp: 90 tablet, Rfl: 1 .  furosemide (LASIX) 20 MG tablet, Take 1 tablet (20 mg total) by mouth 2 (two) times daily as needed. (Patient taking differently: Take 20 mg by mouth daily. ), Disp: 60 tablet, Rfl: 6 .  glimepiride (AMARYL) 1 MG tablet, TAKE 1/2 TABLET AM DAILY., Disp: , Rfl:  .  LANTUS 100 UNIT/ML injection, Inject 18 Units into the skin 2 (two) times daily. , Disp: , Rfl:  .  meloxicam (MOBIC) 15 MG tablet, Take 15 mg by mouth daily. , Disp: , Rfl:  .  omeprazole  (PRILOSEC) 20 MG capsule, TAKE 1 CAPSULE BY MOUTH TWO TIMES DAILY, Disp: 60 capsule, Rfl: 11 .  potassium chloride (K-DUR,KLOR-CON) 10 MEQ tablet, TAKE 1 TABLET BY MOUTH DAILY, Disp: 30 tablet, Rfl: 9 .  predniSONE (DELTASONE) 20 MG tablet, Take 2 tablets (40 mg total) by mouth daily., Disp: 8 tablet, Rfl: 0 .  ramipril (ALTACE) 10 MG capsule, Take 1 capsule (10 mg total) by mouth daily., Disp: 90 capsule, Rfl: 3 .  traMADol (ULTRAM) 50 MG tablet, Take 50 mg by mouth as needed. , Disp: , Rfl:   Past Medical History: Past Medical History:  Diagnosis Date  . Cataract   . Coronary artery disease    a. 4v CABG 01/2005; b. cath 06/05/13 showed patent grafts: LIMA-LAD, VG-D, VG-OM3, VG-dRCA, nl filling pressures, nl LV fxn  . Diastolic dysfunction    a. echo 05/1495: nl systolic fxn, mild LVH, diastolic relaxation abnormality, mildly enlarged LA, mild Ao insufficiency  . ED (erectile dysfunction)   . GERD (gastroesophageal reflux disease)   . Hyperlipidemia   . Hypertension   . Hypertrophy of prostate without urinary obstruction and other lower urinary tract symptoms (LUTS)   . IDDM (insulin dependent diabetes mellitus) ([place]) 2000  . Osteoarthritis   . Perirectal fistula   . Tubular adenoma of colon 07/2012    Tobacco Use: History  Smoking Status  . Never Smoker  Smokeless Tobacco  . Never Used    Labs: Recent Review Flowsheet Data    Labs for ITP Cardiac and Pulmonary Rehab Latest Ref Rng & Units 07/30/2013 11/05/2013 07/04/2014 11/07/2014 02/06/2015   Cholestrol 0 - 200 mg/dL 112 - 172 - 111   LDLCALC 0 - 99 mg/dL 61 - 110(H) - 61   HDL >39.00 mg/dL 39(L) - 39(L) - 31.90(L)   Trlycerides 0.0 - 149.0 mg/dL 59 - 115 - 92.0   Hemoglobin A1c 4.0 - 6.0 % - 6.5(A) - 7.0(A) -   PHART 7.350 - 7.450 - - - - -   PCO2ART 35.0 - 45.0 mmHg - - - - -   HCO3 20.0 - 24.0 mEq/L - - - - -   TCO2 0 - 100 mmol/L - - - - -   O2SAT % - - - - -       Exercise Target Goals:    Exercise Program  Goal: Individual exercise prescription set with THRR, safety & activity barriers. Participant demonstrates ability to understand and report RPE using BORG scale, to self-measure pulse accurately, and to acknowledge the importance of the exercise prescription.  Exercise Prescription Goal: Starting with aerobic activity 30 plus minutes a day, 3 days per week for initial exercise prescription. Provide home exercise prescription and guidelines that participant acknowledges understanding prior to discharge.  Activity Barriers & Risk Stratification:   6 Minute Walk:   Oxygen Initial Assessment:   Oxygen Re-Evaluation:   Oxygen Discharge (Final Oxygen Re-Evaluation):   Initial Exercise Prescription:   Perform Capillary Blood Glucose checks as needed.  Exercise Prescription Changes:     Exercise Prescription Changes    Row Name 12/24/15 1200 01/06/16 1200 01/21/16 1100 02/03/16 1400 02/18/16 1000     Response to Exercise   Blood Pressure (Admit) 168/80  - 140/68 170/80 130/62   Blood Pressure (Exercise) 138/78 186/70 160/74 140/80 148/82   Blood Pressure (Exit) 132/72 124/72 130/72 118/60 132/70   Heart Rate (Admit) 58 bpm 79 bpm 77 bpm 64 bpm 75 bpm   Heart Rate (Exercise) 92 bpm 103 bpm 122 bpm 107 bpm 85 bpm   Heart Rate (Exit) 66 bpm 79 bpm 61 bpm 77 bpm 72 bpm   Rating of Perceived Exertion (Exercise) '12 11 12 12 11   '$ Duration Progress to 45 minutes of aerobic exercise without signs/symptoms of physical distress Progress to 45 minutes of aerobic exercise without signs/symptoms of physical distress Progress to 45 minutes of aerobic exercise without signs/symptoms of physical distress Progress to 45 minutes of aerobic exercise without signs/symptoms of physical distress Progress to 45 minutes of aerobic exercise without signs/symptoms of physical distress   Intensity THRR unchanged THRR unchanged THRR unchanged THRR unchanged THRR unchanged     Progression   Progression  -  Continue to progress workloads to maintain intensity without signs/symptoms of physical distress. Continue to progress workloads to maintain intensity without signs/symptoms of physical distress. Continue to progress workloads to maintain intensity without signs/symptoms of physical distress. Continue to progress workloads to maintain intensity without signs/symptoms of physical distress.   Average METs  - 1.97 2.4 3.4 2.13     Resistance Training   Training Prescription Yes Yes Yes Yes Yes   Weight '2 1 1 3 3   '$ Reps 10-12 10-15 10-15  - 10-15     Interval Training   Interval Training No No No  - No     Treadmill  MPH 1.4 1.4 2.5 2.5  -   Grade 0 0 0 0  -   Minutes '15 15 15 15  '$ -   METs 2.07 2.07 2.91 2.91  -     Bike   Level  -  - 0.5  - 0.5   Minutes  -  - 15  - 15   METs  -  - 1.86  - 1.86     Recumbant Elliptical   Level 1  -  -  -  -   Minutes 15  -  -  -  -     T5 Nustep   Level  -  -  - 1 1   Minutes  -  -  - 15 15   METs  -  -  - 3.9 2.4     Exercise Review   Progression  - Yes Yes  - Yes   Row Name 04/14/16 1100 04/28/16 1300           Response to Exercise   Blood Pressure (Admit) 138/72 128/80      Blood Pressure (Exercise) 152/76 160/80      Blood Pressure (Exit) 136/62 140/78      Heart Rate (Admit) 72 bpm 83 bpm      Heart Rate (Exercise) 89 bpm 89 bpm      Heart Rate (Exit) 83 bpm 76 bpm      Rating of Perceived Exertion (Exercise) 11 12      Duration Progress to 45 minutes of aerobic exercise without signs/symptoms of physical distress Progress to 45 minutes of aerobic exercise without signs/symptoms of physical distress      Intensity THRR unchanged THRR unchanged        Progression   Progression Continue to progress workloads to maintain intensity without signs/symptoms of physical distress. Continue to progress workloads to maintain intensity without signs/symptoms of physical distress.      Average METs 2.8 2.39        Resistance Training    Training Prescription Yes Yes      Weight 3 3      Reps 10-15 10-15        Interval Training   Interval Training No No        Treadmill   MPH 2.5 2.5      Grade 0 0      Minutes 15 15      METs 2.91 2.91        Bike   Level  - 0.5      Minutes  - 15      METs  - 1.87        T5 Nustep   Level 1  -      Minutes 15  -      METs 2.7  -         Exercise Comments:     Exercise Comments    Row Name 12/14/15 1828 12/24/15 1212 01/06/16 1254 01/21/16 1147 02/03/16 1409   Exercise Comments First full day of exercise! [name] was oriented to gym and equipment including functions, settings, policies, and procedures. [name]'s individual exercise prescription and treatment plan were reviewed.  All starting workloads were established based on the results of the 6 minute walk test done at initial orientation visit.  The plan for exercise progression was also introduced and progression will be customized based on patient's performance and goals. Mr Reaser is progressing well with exercise. Mr Delker is doing  well with exercise. Mr Deardorff is doing well with exercise. [name] is progressing well with exercise.   Trion Name 02/18/16 1058 04/14/16 1144 04/28/16 1309       Exercise Comments [name] has done well with exercise and staff will work with him to increase RPE levels. [name] has just returned to [facility] after being out with bronchitis.  Staff will review goals  with [name]. [name] has attended two sessions for the month of February.  Better attendance is necessary for progress with the program.        Exercise Goals and Review:   Exercise Goals Re-Evaluation :   Discharge Exercise Prescription (Final Exercise Prescription Changes):     Exercise Prescription Changes - 04/28/16 1300      Response to Exercise   Blood Pressure (Admit) 128/80   Blood Pressure (Exercise) 160/80   Blood Pressure (Exit) 140/78   Heart Rate (Admit) 83 bpm   Heart Rate (Exercise) 89 bpm   Heart Rate (Exit) 76 bpm    Rating of Perceived Exertion (Exercise) 12   Duration Progress to 45 minutes of aerobic exercise without signs/symptoms of physical distress   Intensity THRR unchanged     Progression   Progression Continue to progress workloads to maintain intensity without signs/symptoms of physical distress.   Average METs 2.39     Resistance Training   Training Prescription Yes   Weight 3   Reps 10-15     Interval Training   Interval Training No     Treadmill   MPH 2.5   Grade 0   Minutes 15   METs 2.91     Bike   Level 0.5   Minutes 15   METs 1.87      Nutrition:  Target Goals: Understanding of nutrition guidelines, daily intake of sodium '1500mg'$ , cholesterol '200mg'$ , calories 30% from fat and 7% or less from saturated fats, daily to have 5 or more servings of fruits and vegetables.  Biometrics:    Nutrition Therapy Plan and Nutrition Goals:     Nutrition Therapy & Goals - 01/04/16 1342      Nutrition Therapy   Diet Diabetes diet (takes 2 insulin injections a day)    Drug/Food Interactions Statins/Certain Fruits     Personal Nutrition Goals   Comments "[name]" said he knows what to eat.       Nutrition Discharge: Rate Your Plate Scores:   Nutrition Goals Re-Evaluation:     Nutrition Goals Re-Evaluation    Row Name 01/04/16 1352 04/13/16 1704           Goals   Nutrition Goal Heart healthy diabetic diet Prefers not to meet individually with the registered dietician. He is eating a heart healthy-diabetic diet. His HBA1c is 6.9       Comment "[name]" Yuan said he would prefer not to meet individually with the Cardiac Rehab Registered Dietician. [name] said "I am 82 and set in my ways somedays but my blood sugar this am was 103 and yesterday 133.  "[name]" said he made a mistake and used regular cough drops so his blood sugars where up in the 200's but he walked around at home and got it a little lower.         Personal Goal #1 Re-Evaluation   Goal Progress Seen Yes  -         Intervention Plan   Intervention Continue to educate, counsel and set short/long term goals regarding individualized specific personal dietary modifications.  -  Nutrition Goals Discharge (Final Nutrition Goals Re-Evaluation):     Nutrition Goals Re-Evaluation - 04/13/16 1704      Goals   Nutrition Goal Prefers not to meet individually with the registered dietician. He is eating a heart healthy-diabetic diet. His HBA1c is 6.9    Comment "[name]" said he made a mistake and used regular cough drops so his blood sugars where up in the 200's but he walked around at home and got it a little lower.       Psychosocial: Target Goals: Acknowledge presence or absence of significant depression and/or stress, maximize coping skills, provide positive support system. Participant is able to verbalize types and ability to use techniques and skills needed for reducing stress and depression.   Initial Review & Psychosocial Screening:   Quality of Life Scores:    PHQ-9: Recent Review Flowsheet Data    Depression screen [facility] 2/9 10/05/2015 02/06/2015 02/05/2014   Decreased Interest 0 0 0   Down, Depressed, Hopeless 0 0 0   PHQ - 2 Score 0 0 0   Altered sleeping 1 - -   Tired, decreased energy 2 - -   Change in appetite 2 - -   Feeling bad or failure about yourself  0 - -   Trouble concentrating 0 - -   Moving slowly or fidgety/restless 0 - -   Suicidal thoughts 0 - -   PHQ-9 Score 5 - -   Difficult doing work/chores Not difficult at all - -     Interpretation of Total Score  Total Score Depression Severity:  1-4 = Minimal depression, 5-9 = Mild depression, 10-14 = Moderate depression, 15-19 = Moderately severe depression, 20-27 = Severe depression   Psychosocial Evaluation and Intervention:     Psychosocial Evaluation - 02/01/16 1733      Psychosocial Evaluation & Interventions   Comments Counselor met with Mr. [facility]) today for initial psychosocial evaluation.  He is an 83 yr  old who was in this program 11 years ago when he had 5 by-passes done.  He has blockages now and reports Drs are unable to do stents so ordered this program again.  [name] has a strong support system with a spouse of (6) years; an adult child close by and active involvement in his local church.  He has arthritis and type 2 diabetes which has been a challenge for him lately as his Dr. Ricard Dillon his insulin prescription 2 months ago and he has had more ups and downs with his sugar levels.  He also reports he is not sleeping great over the past year with waking up and difficulty getting back to sleep.  He is tired as a result and has decreased appetite as well.  [name] denies a history of depression or anxiety and states he is typically in a positive mood.  He has goals to "increase circulation and open up those blockages" with exercise.  He would like to return to riding his bike outside 30-60 mins 3-5X per week once he completes this program.   Staff will be following with [name] throughout the course of this program.     Continue Psychosocial Services  Yes  [name] may need to be seen by a medical provider for his insulin prescription and his sleep challenges which are impacting his overall health.  Staff will follow      Psychosocial Re-Evaluation:     Psychosocial Re-Evaluation    Floyd Name 01/04/16 1359 04/13/16 1717  Psychosocial Re-Evaluation   Comments "[name]" Shirline Frees said "I try not to get stressed too much". We discussed how riding his bicycle in his neighborhood is good for exercise and to decrease any stress.  [name] said he tries to smile alot and at age 42 just tries to not worry and be happy.       Interventions Encouraged to attend Cardiac Rehabilitation for the exercise  -         Psychosocial Discharge (Final Psychosocial Re-Evaluation):     Psychosocial Re-Evaluation - 04/13/16 1717      Psychosocial Re-Evaluation   Comments [name] said he tries to smile alot and at age 61 just  tries to not worry and be happy.       Vocational Rehabilitation: Provide vocational rehab assistance to qualifying candidates.   Vocational Rehab Evaluation & Intervention:   Education: Education Goals: Education classes will be provided on a weekly basis, covering required topics. Participant will state understanding/return demonstration of topics presented.  Learning Barriers/Preferences:   Education Topics: General Nutrition Guidelines/Fats and Fiber: -Group instruction provided by verbal, written material, models and posters to present the general guidelines for heart healthy nutrition. Gives an explanation and review of dietary fats and fiber.   Controlling Sodium/Reading Food Labels: -Group verbal and written material supporting the discussion of sodium use in heart healthy nutrition. Review and explanation with models, verbal and written materials for utilization of the food label.   Exercise Physiology & Risk Factors: - Group verbal and written instruction with models to review the exercise physiology of the cardiovascular system and associated critical values. Details cardiovascular disease risk factors and the goals associated with each risk factor. Flowsheet Row Cardiac Rehab from 04/18/2016 in [facility] Cardiac and Pulmonary Rehab  Date  01/13/16  Educator  AS  Instruction Review Code  2- meets goals/outcomes      Aerobic Exercise & Resistance Training: - Gives group verbal and written discussion on the health impact of inactivity. On the components of aerobic and resistive training programs and the benefits of this training and how to safely progress through these programs. Flowsheet Row Cardiac Rehab from 04/18/2016 in [facility] Cardiac and Pulmonary Rehab  Date  02/01/16  Educator  K. Amedeo Plenty, RN  Instruction Review Code  2- meets goals/outcomes      Flexibility, Balance, General Exercise Guidelines: - Provides group verbal and written instruction on the benefits of  flexibility and balance training programs. Provides general exercise guidelines with specific guidelines to those with heart or lung disease. Demonstration and skill practice provided. Flowsheet Row Cardiac Rehab from 04/18/2016 in [facility] Cardiac and Pulmonary Rehab  Date  12/14/15  Educator  [facility]  Instruction Review Code  2- meets goals/outcomes      Stress Management: - Provides group verbal and written instruction about the health risks of elevated stress, cause of high stress, and healthy ways to reduce stress. Flowsheet Row Cardiac Rehab from 04/18/2016 in [facility] Cardiac and Pulmonary Rehab  Date  04/13/16  Educator  [facility]  Instruction Review Code  2- meets goals/outcomes      Depression: - Provides group verbal and written instruction on the correlation between heart/lung disease and depressed mood, treatment options, and the stigmas associated with seeking treatment.   Anatomy & Physiology of the Heart: - Group verbal and written instruction and models provide basic cardiac anatomy and physiology, with the coronary electrical and arterial systems. Review of: AMI, Angina, Valve disease, Heart Failure, Cardiac Arrhythmia, Pacemakers, and the ICD.  Flowsheet Row Cardiac Rehab from 04/18/2016 in [facility] Cardiac and Pulmonary Rehab  Date  02/08/16  Educator  SB  Instruction Review Code  2- meets goals/outcomes      Cardiac Procedures: - Group verbal and written instruction and models to describe the testing methods done to diagnose heart disease. Reviews the outcomes of the test results. Describes the treatment choices: Medical Management, Angioplasty, or Coronary Bypass Surgery. Flowsheet Row Cardiac Rehab from 04/18/2016 in [facility] Cardiac and Pulmonary Rehab  Date  04/18/16  Educator  CE  Instruction Review Code  2- meets goals/outcomes      Cardiac Medications: - Group verbal and written instruction to review commonly prescribed medications for heart disease. Reviews the medication, class  of the drug, and side effects. Includes the steps to properly store meds and maintain the prescription regimen.   Go Sex-Intimacy & Heart Disease, Get SMART - Goal Setting: - Group verbal and written instruction through game format to discuss heart disease and the return to sexual intimacy. Provides group verbal and written material to discuss and apply goal setting through the application of the S.M.A.R.T. Method. Flowsheet Row Cardiac Rehab from 04/18/2016 in [facility] Cardiac and Pulmonary Rehab  Date  04/18/16  Educator  CE  Instruction Review Code  2- meets goals/outcomes      Other Matters of the Heart: - Provides group verbal, written materials and models to describe Heart Failure, Angina, Valve Disease, and Diabetes in the realm of heart disease. Includes description of the disease process and treatment options available to the cardiac patient. Flowsheet Row Cardiac Rehab from 04/18/2016 in [facility] Cardiac and Pulmonary Rehab  Date  04/11/16  Educator  C. Enterkin, RN  Instruction Review Code  2- meets goals/outcomes      Exercise & Equipment Safety: - Individual verbal instruction and demonstration of equipment use and safety with use of the equipment. Flowsheet Row Cardiac Rehab from 04/18/2016 in [facility] Cardiac and Pulmonary Rehab  Date  10/05/15  Educator  SB  Instruction Review Code  2- meets goals/outcomes      Infection Prevention: - Provides verbal and written material to individual with discussion of infection control including proper hand washing and proper equipment cleaning during exercise session. Flowsheet Row Cardiac Rehab from 04/18/2016 in [facility] Cardiac and Pulmonary Rehab  Date  10/05/15  Educator  SB  Instruction Review Code  2- meets goals/outcomes      Falls Prevention: - Provides verbal and written material to individual with discussion of falls prevention and safety.   Diabetes: - Individual verbal and written instruction to review signs/symptoms of  diabetes, desired ranges of glucose level fasting, after meals and with exercise. Advice that pre and post exercise glucose checks will be done for 3 sessions at entry of program. Flowsheet Row Cardiac Rehab from 04/18/2016 in [facility] Cardiac and Pulmonary Rehab  Date  10/05/15  Educator  SB  Instruction Review Code  2- meets goals/outcomes       Knowledge Questionnaire Score:   Core Components/Risk Factors/Patient Goals at Admission:   Core Components/Risk Factors/Patient Goals Review:      Goals and Risk Factor Review    Row Name 01/04/16 1353 01/21/16 1710 04/13/16 1715         Core Components/Risk Factors/Patient Goals Review   Personal Goals Review Weight Management/Obesity;Increase Strength and Stamina;Lipids;Hypertension;Diabetes Weight Management/Obesity;Diabetes;Sedentary Diabetes;Weight Management/Obesity;Sedentary;Lipids     Review "[name]" said he tries to ride his bicycle in his neighborhood the days he is not attending Cardiac Rehab.  He said he still "gets out of air but realizes that the MD told him there are some clogged heart arteries that they can't totally fix". He said his blood pressure has been up lately and he is going to get himself a new blood pressure cufff. [name] admits to having a history of back pain and had a injection in his knee last month that increased his blood sugars but he reports his HBA1c is usually about 6.9. [name] said he is not regular enough with his attendance with his Cardiac rehab to get his stamina back but he hopes to do better. I offered the 8am Tuesday /Thursday Cardiac REhab for a make up day but [name] said he is not a morning person. He is also having company from [place] this evening but he hopes to attend Cardiac Rehab this evening. [name] thanked me for my phone call to him to check up with him on an individual basis.  [name] has been having high and low blood sugar episodes.  He has called his Dr but has not yet received a call back.  He was instructed to  call again.  His insulin was changed a few weeks ago.  HbA1C was 7.0 at last check.  His weight is down and he has been eating less and riding his bike outdoors most days at home.  he would like to meet with the RD. [name] said he lost from 15 lbs and his goal is still 200lbs. He admits when he was sick with his bad cough he didn't exercise plus he rides his bicycle alot and is hoping for warm weather soon. [name] said he eats healthy most of the time to keep his cholestrol level good.      Expected Outcomes Cont [place] healthy lifestyle. Increased stamina. Better control of his blood pressure (please see telemetry sheets for his blood pressure).  [name] will get his BG under better control with the help of his Dr. and learning better dietary habits. Cont heart healthy diabetic lifestyle.         Core Components/Risk Factors/Patient Goals at Discharge (Final Review):      Goals and Risk Factor Review - 04/13/16 1715      Core Components/Risk Factors/Patient Goals Review   Personal Goals Review Diabetes;Weight Management/Obesity;Sedentary;Lipids   Review [name] said he lost from 15 lbs and his goal is still 200lbs. He admits when he was sick with his bad cough he didn't exercise plus he rides his bicycle alot and is hoping for warm weather soon. [name] said he eats healthy most of the time to keep his cholestrol level good.    Expected Outcomes Cont heart healthy diabetic lifestyle.       ITP Comments:     ITP Comments    Row Name 12/01/15 1537 12/16/15 0659 01/13/16 0628 01/14/16 1753 02/10/16 0626   ITP Comments ITP ready for initial signature.  Documentation of diagnosis found in Office Note 11/23/2015 [facility] 30 day review. Continue with ITP unless changes noted by Medical Director at signature of review. Has started classes in this month 30 day review completed for Medical Director physician review and signature. Continue ITP unless changes made by physician. intermittent attendance personal reasons After  exercise Ioane was not feeling well and his blood sugar dropped to 51 mg/dL. He was given 4 glucose tabs and his blood sugar went up to 93 mg/dL. He ate PB crackers before leaving to go home and have dinner.  30 day review completed for  review by Dr Emily Filbert.  Continue with ITP unless changes noted by Dr Sabra Heck.   Row Name 03/04/16 1024 03/08/16 0635 04/06/16 0642 05/04/16 0626     ITP Comments Called to check on [name].  He has been out with cold symptoms and hacking cough for two weeks.  He is planning to call the doctor about it again. 30 day review. Continue with ITP unless directed changes per Medical Director review. 30 day review. Continue with ITP unless directed changes per Medical Director review. LAst visit 02/15/2016 30 day review. Continue with ITP unless directed changes per Medical Director review. [name] returns to complete program.       Comments:

## 2016-05-05 ENCOUNTER — Encounter: Payer: Medicare Other | Attending: Cardiovascular Disease

## 2016-05-05 VITALS — Ht 71.8 in | Wt 218.0 lb

## 2016-05-05 DIAGNOSIS — I5032 Chronic diastolic (congestive) heart failure: Secondary | ICD-10-CM | POA: Diagnosis not present

## 2016-05-05 DIAGNOSIS — Z951 Presence of aortocoronary bypass graft: Secondary | ICD-10-CM | POA: Diagnosis not present

## 2016-05-05 DIAGNOSIS — I208 Other forms of angina pectoris: Secondary | ICD-10-CM

## 2016-05-05 NOTE — Progress Notes (Addendum)
Daily Session Note  Patient Details  Name: MAYANK TEUSCHER MRN: [id number] Date of Birth: [birth year] Referring Provider:   Flowsheet Row Cardiac Rehab from 10/05/2015 in [facility]  Referring Provider  Ida Rogue MD      Encounter Date: 05/05/2016  Check In:     Session Check In - 05/05/16 1718      Check-In   Location [facility]iac [facility]   Staff Present Levell July RN Moises Blood, BS, ACSM CEP, Exercise Physiologist;Rosbel Buckner Oletta Darter, [place], ACSM CEP, Exercise Physiologist   Supervising physician immediately available to respond to emergencies See telemetry face sheet for immediately available ER MD   Medication changes reported     No   Fall or balance concerns reported    No   Tobacco Cessation Use Decreased   Warm-up and Cool-down Performed on first and last piece of equipment   Resistance Training Performed Yes   VAD Patient? No     Pain Assessment   Currently in Pain? No/denies         History  Smoking Status  . Never Smoker  Smokeless Tobacco  . Never Used    Goals Met:  Independence with exercise equipment Exercise tolerated well No report of cardiac concerns or symptoms Strength training completed today  Goals Unmet:  Not Applicable  Comments:      6 Minute Walk    Row Name 05/05/16 1723         6 Minute Walk   Phase Discharge     Distance 1100 feet     Walk Time 6 minutes     # of Rest Breaks 0     MPH 2.08     METS 2.47     RPE 12     VO2 Peak 8.7     Symptoms No     Resting HR 54 bpm     Resting BP 142/74     Max Ex. HR 107 bpm     Max Ex. BP 184/68       Pt able to follow exercise prescription today without complaint.  Will continue to monitor for progression.  [name] graduated today from cardiac rehab with 21 sessions completed.  Details of the patient's exercise prescription and what He needs to do in order to continue the prescription and progress were discussed with patient.  Patient was  given a copy of prescription and goals.  Patient verbalized understanding.  [name] plans to continue to exercise by walking at home.    Dr. Emily Filbert is Medical Director for [place] [facility].

## 2016-05-06 ENCOUNTER — Ambulatory Visit (INDEPENDENT_AMBULATORY_CARE_PROVIDER_SITE_OTHER): Payer: Medicare Other | Admitting: Pulmonary Disease

## 2016-05-06 ENCOUNTER — Encounter: Payer: Self-pay | Admitting: Pulmonary Disease

## 2016-05-06 VITALS — BP 142/70 | HR 68 | Wt 217.0 lb

## 2016-05-06 DIAGNOSIS — I25810 Atherosclerosis of coronary artery bypass graft(s) without angina pectoris: Secondary | ICD-10-CM | POA: Diagnosis not present

## 2016-05-06 DIAGNOSIS — R0600 Dyspnea, unspecified: Secondary | ICD-10-CM

## 2016-05-06 DIAGNOSIS — E663 Overweight: Secondary | ICD-10-CM | POA: Diagnosis not present

## 2016-05-06 DIAGNOSIS — R0609 Other forms of dyspnea: Secondary | ICD-10-CM | POA: Diagnosis not present

## 2016-05-06 NOTE — Progress Notes (Signed)
Cardiac Individual Treatment Plan  Patient Details  Name: [name] [name] MRN: [id number] Date of Birth: 14-Mar-1933 Referring Provider:   Flowsheet Row Cardiac Rehab from 10/05/2015 in [facility] Cardiac and Pulmonary Rehab  Referring Provider  Ida Rogue MD      Initial Encounter Date:  Flowsheet Row Cardiac Rehab from 10/05/2015 in [facility] Cardiac and Pulmonary Rehab  Date  10/05/15  Referring Provider  Ida Rogue MD      Visit Diagnosis: Stable angina [facility])  Patient's Home Medications on Admission:  Current Outpatient Prescriptions:  .  acetaminophen (TYLENOL) 650 MG CR tablet, Take 650 mg by mouth every 8 (eight) hours as needed for pain. For hand arthritis, Disp: , Rfl:  .  albuterol (PROVENTIL HFA;VENTOLIN HFA) 108 (90 Base) MCG/ACT inhaler, Inhale 2 puffs into the lungs every 4 (four) hours as needed for wheezing or shortness of breath., Disp: 1 Inhaler, Rfl: 0 .  aspirin 81 MG tablet, Take 162 mg by mouth daily. , Disp: , Rfl:  .  atorvastatin (LIPITOR) 40 MG tablet, Take 1 tablet (40 mg total) by mouth every other day., Disp: 90 tablet, Rfl: 3 .  BD INSULIN SYRINGE ULTRAFINE 31G X 15/64" 0.3 ML MISC, USE AS DIRECTED, Disp: 100 each, Rfl: 3 .  carvedilol (COREG) 12.5 MG tablet, TAKE ONE TABLET TWICE A DAY WITH MEALS., Disp: 180 tablet, Rfl: 3 .  finasteride (PROSCAR) 5 MG tablet, Take 1 tablet (5 mg total) by mouth daily., Disp: 90 tablet, Rfl: 1 .  furosemide (LASIX) 20 MG tablet, Take 1 tablet (20 mg total) by mouth 2 (two) times daily as needed. (Patient taking differently: Take 20 mg by mouth daily. ), Disp: 60 tablet, Rfl: 6 .  glimepiride (AMARYL) 1 MG tablet, TAKE 1/2 TABLET AM DAILY., Disp: , Rfl:  .  LANTUS 100 UNIT/ML injection, Inject 18 Units into the skin 2 (two) times daily. , Disp: , Rfl:  .  omeprazole (PRILOSEC) 20 MG capsule, TAKE 1 CAPSULE BY MOUTH TWO TIMES DAILY, Disp: 60 capsule, Rfl: 11 .  potassium chloride (K-DUR,KLOR-CON) 10 MEQ tablet, TAKE 1 TABLET  BY MOUTH DAILY, Disp: 30 tablet, Rfl: 9 .  ramipril (ALTACE) 10 MG capsule, Take 1 capsule (10 mg total) by mouth daily., Disp: 90 capsule, Rfl: 3 .  traMADol (ULTRAM) 50 MG tablet, Take 50 mg by mouth as needed. , Disp: , Rfl:   Past Medical History: Past Medical History:  Diagnosis Date  . Cataract   . Coronary artery disease    a. 4v CABG 01/2005; b. cath 06/05/13 showed patent grafts: LIMA-LAD, VG-D, VG-OM3, VG-dRCA, nl filling pressures, nl LV fxn  . Diastolic dysfunction    a. echo 05/3293: nl systolic fxn, mild LVH, diastolic relaxation abnormality, mildly enlarged LA, mild Ao insufficiency  . ED (erectile dysfunction)   . GERD (gastroesophageal reflux disease)   . Hyperlipidemia   . Hypertension   . Hypertrophy of prostate without urinary obstruction and other lower urinary tract symptoms (LUTS)   . IDDM (insulin dependent diabetes mellitus) ([place]) 2000  . Osteoarthritis   . Perirectal fistula   . Tubular adenoma of colon 07/2012    Tobacco Use: History  Smoking Status  . Never Smoker  Smokeless Tobacco  . Never Used    Labs: Recent Review Flowsheet Data    Labs for ITP Cardiac and Pulmonary Rehab Latest Ref Rng & Units 07/30/2013 11/05/2013 07/04/2014 11/07/2014 02/06/2015   Cholestrol 0 - 200 mg/dL 112 - 172 - 111  LDLCALC 0 - 99 mg/dL 61 - 110(H) - 61   HDL >39.00 mg/dL 39(L) - 39(L) - 31.90(L)   Trlycerides 0.0 - 149.0 mg/dL 59 - 115 - 92.0   Hemoglobin A1c 4.0 - 6.0 % - 6.5(A) - 7.0(A) -   PHART 7.350 - 7.450 - - - - -   PCO2ART 35.0 - 45.0 mmHg - - - - -   HCO3 20.0 - 24.0 mEq/L - - - - -   TCO2 0 - 100 mmol/L - - - - -   O2SAT % - - - - -       Exercise Target Goals:    Exercise Program Goal: Individual exercise prescription set with THRR, safety & activity barriers. Participant demonstrates ability to understand and report RPE using BORG scale, to self-measure pulse accurately, and to acknowledge the importance of the exercise prescription.  Exercise  Prescription Goal: Starting with aerobic activity 30 plus minutes a day, 3 days per week for initial exercise prescription. Provide home exercise prescription and guidelines that participant acknowledges understanding prior to discharge.  Activity Barriers & Risk Stratification:   6 Minute Walk:     6 Minute Walk    Row Name 05/05/16 1723         6 Minute Walk   Phase Discharge     Distance 1100 feet     Walk Time 6 minutes     # of Rest Breaks 0     MPH 2.08     METS 2.47     RPE 12     VO2 Peak 8.7     Symptoms No     Resting HR 54 bpm     Resting BP 142/74     Max Ex. HR 107 bpm     Max Ex. BP 184/68        Oxygen Initial Assessment:   Oxygen Re-Evaluation:   Oxygen Discharge (Final Oxygen Re-Evaluation):   Initial Exercise Prescription:   Perform Capillary Blood Glucose checks as needed.  Exercise Prescription Changes:     Exercise Prescription Changes    Row Name 12/24/15 1200 01/06/16 1200 01/21/16 1100 02/03/16 1400 02/18/16 1000     Response to Exercise   Blood Pressure (Admit) 168/80  - 140/68 170/80 130/62   Blood Pressure (Exercise) 138/78 186/70 160/74 140/80 148/82   Blood Pressure (Exit) 132/72 124/72 130/72 118/60 132/70   Heart Rate (Admit) 58 bpm 79 bpm 77 bpm 64 bpm 75 bpm   Heart Rate (Exercise) 92 bpm 103 bpm 122 bpm 107 bpm 85 bpm   Heart Rate (Exit) 66 bpm 79 bpm 61 bpm 77 bpm 72 bpm   Rating of Perceived Exertion (Exercise) 12 11 12 12 11    Duration Progress to 45 minutes of aerobic exercise without signs/symptoms of physical distress Progress to 45 minutes of aerobic exercise without signs/symptoms of physical distress Progress to 45 minutes of aerobic exercise without signs/symptoms of physical distress Progress to 45 minutes of aerobic exercise without signs/symptoms of physical distress Progress to 45 minutes of aerobic exercise without signs/symptoms of physical distress   Intensity THRR unchanged THRR unchanged THRR unchanged  THRR unchanged THRR unchanged     Progression   Progression  - Continue to progress workloads to maintain intensity without signs/symptoms of physical distress. Continue to progress workloads to maintain intensity without signs/symptoms of physical distress. Continue to progress workloads to maintain intensity without signs/symptoms of physical distress. Continue to progress workloads to maintain intensity without signs/symptoms  of physical distress.   Average METs  - 1.97 2.4 3.4 2.13     Resistance Training   Training Prescription Yes Yes Yes Yes Yes   Weight 2 1 1 3 3    Reps 10-12 10-15 10-15  - 10-15     Interval Training   Interval Training No No No  - No     Treadmill   MPH 1.4 1.4 2.5 2.5  -   Grade 0 0 0 0  -   Minutes 15 15 15 15   -   METs 2.07 2.07 2.91 2.91  -     Bike   Level  -  - 0.5  - 0.5   Minutes  -  - 15  - 15   METs  -  - 1.86  - 1.86     Recumbant Elliptical   Level 1  -  -  -  -   Minutes 15  -  -  -  -     T5 Nustep   Level  -  -  - 1 1   Minutes  -  -  - 15 15   METs  -  -  - 3.9 2.4     Exercise Review   Progression  - Yes Yes  - Yes   Row Name 04/14/16 1100 04/28/16 1300           Response to Exercise   Blood Pressure (Admit) 138/72 128/80      Blood Pressure (Exercise) 152/76 160/80      Blood Pressure (Exit) 136/62 140/78      Heart Rate (Admit) 72 bpm 83 bpm      Heart Rate (Exercise) 89 bpm 89 bpm      Heart Rate (Exit) 83 bpm 76 bpm      Rating of Perceived Exertion (Exercise) 11 12      Duration Progress to 45 minutes of aerobic exercise without signs/symptoms of physical distress Progress to 45 minutes of aerobic exercise without signs/symptoms of physical distress      Intensity THRR unchanged THRR unchanged        Progression   Progression Continue to progress workloads to maintain intensity without signs/symptoms of physical distress. Continue to progress workloads to maintain intensity without signs/symptoms of physical  distress.      Average METs 2.8 2.39        Resistance Training   Training Prescription Yes Yes      Weight 3 3      Reps 10-15 10-15        Interval Training   Interval Training No No        Treadmill   MPH 2.5 2.5      Grade 0 0      Minutes 15 15      METs 2.91 2.91        Bike   Level  - 0.5      Minutes  - 15      METs  - 1.87        T5 Nustep   Level 1  -      Minutes 15  -      METs 2.7  -         Exercise Comments:     Exercise Comments    Row Name 12/14/15 1828 12/24/15 1212 01/06/16 1254 01/21/16 1147 02/03/16 1409   Exercise Comments First full day of exercise! [name] was oriented to gym and  equipment including functions, settings, policies, and procedures. [name]'s individual exercise prescription and treatment plan were reviewed.  All starting workloads were established based on the results of the 6 minute walk test done at initial orientation visit.  The plan for exercise progression was also introduced and progression will be customized based on patient's performance and goals. Mr Narang is progressing well with exercise. Mr Lambson is doing well with exercise. Mr Semper is doing well with exercise. [name] is progressing well with exercise.   [place] Name 02/18/16 1058 04/14/16 1144 04/28/16 1309 05/06/16 0959     Exercise Comments [name] has done well with exercise and staff will work with him to increase RPE levels. [name] has just returned to [facility] after being out with bronchitis.  Staff will review goals  with [name]. [name] has attended two sessions for the month of February.  Better attendance is necessary for progress with the program.  [name] graduated today from cardiac rehab with 21 sessions completed.  Details of the patient's exercise prescription and what He needs to do in order to continue the prescription and progress were discussed with patient.  Patient was given a copy of prescription and goals.  Patient verbalized understanding.  [name] plans to continue to exercise  by walking at home.       Exercise Goals and Review:   Exercise Goals Re-Evaluation :   Discharge Exercise Prescription (Final Exercise Prescription Changes):     Exercise Prescription Changes - 04/28/16 1300      Response to Exercise   Blood Pressure (Admit) 128/80   Blood Pressure (Exercise) 160/80   Blood Pressure (Exit) 140/78   Heart Rate (Admit) 83 bpm   Heart Rate (Exercise) 89 bpm   Heart Rate (Exit) 76 bpm   Rating of Perceived Exertion (Exercise) 12   Duration Progress to 45 minutes of aerobic exercise without signs/symptoms of physical distress   Intensity THRR unchanged     Progression   Progression Continue to progress workloads to maintain intensity without signs/symptoms of physical distress.   Average METs 2.39     Resistance Training   Training Prescription Yes   Weight 3   Reps 10-15     Interval Training   Interval Training No     Treadmill   MPH 2.5   Grade 0   Minutes 15   METs 2.91     Bike   Level 0.5   Minutes 15   METs 1.87      Nutrition:  Target Goals: Understanding of nutrition guidelines, daily intake of sodium <15106m, cholesterol <2032m calories 30% from fat and 7% or less from saturated fats, daily to have 5 or more servings of fruits and vegetables.  Biometrics:      Post Biometrics - 05/05/16 1722       Post  Biometrics   Height 5' 11.8" (1.824 m)   Weight 218 lb (98.9 kg)   Waist Circumference 44 inches   Hip Circumference 46.5 inches   Waist to Hip Ratio 0.95 %   BMI (Calculated) 29.8   Single Leg Stand 29 seconds      Nutrition Therapy Plan and Nutrition Goals:     Nutrition Therapy & Goals - 01/04/16 1342      Nutrition Therapy   Diet Diabetes diet (takes 2 insulin injections a day)    Drug/Food Interactions Statins/Certain Fruits     Personal Nutrition Goals   Comments "[name]" said he knows what to eat.  Nutrition Discharge: Rate Your Plate Scores:     Nutrition Assessments - 05/06/16  0954      Rate Your Plate Scores   Pre Score 58   Pre Score % 64 %   Post Score 60   Post Score % 68.9 %   % Change 4.9 %      Nutrition Goals Re-Evaluation:     Nutrition Goals Re-Evaluation    Row Name 01/04/16 1352 04/13/16 1704           Goals   Nutrition Goal Heart healthy diabetic diet Prefers not to meet individually with the registered dietician. He is eating a heart healthy-diabetic diet. His HBA1c is 6.9       Comment "[name]" Woodley said he would prefer not to meet individually with the Cardiac Rehab Registered Dietician. [name] said "I am 82 and set in my ways somedays but my blood sugar this am was 103 and yesterday 133.  "[name]" said he made a mistake and used regular cough drops so his blood sugars where up in the 200's but he walked around at home and got it a little lower.         Personal Goal #1 Re-Evaluation   Goal Progress Seen Yes  -        Intervention Plan   Intervention Continue to educate, counsel and set short/long term goals regarding individualized specific personal dietary modifications.  -         Nutrition Goals Discharge (Final Nutrition Goals Re-Evaluation):     Nutrition Goals Re-Evaluation - 04/13/16 1704      Goals   Nutrition Goal Prefers not to meet individually with the registered dietician. He is eating a heart healthy-diabetic diet. His HBA1c is 6.9    Comment "[name]" said he made a mistake and used regular cough drops so his blood sugars where up in the 200's but he walked around at home and got it a little lower.       Psychosocial: Target Goals: Acknowledge presence or absence of significant depression and/or stress, maximize coping skills, provide positive support system. Participant is able to verbalize types and ability to use techniques and skills needed for reducing stress and depression.   Initial Review & Psychosocial Screening:   Quality of Life Scores:      Quality of Life - 05/06/16 0956      Quality of Life Scores    Health/Function Pre 24.86 %   Health/Function Post 27.63 %   Health/Function % Change 11.14 %   Socioeconomic Pre 30 %   Socioeconomic Post 30 %   Socioeconomic % Change  0 %   Psych/Spiritual Pre 30 %   Psych/Spiritual Post 30 %   Psych/Spiritual % Change 0 %   Family Pre 25.2 %   Family Post 30 %   Family % Change 19.05 %   GLOBAL Pre 27.18 %   GLOBAL Post 28.99 %   GLOBAL % Change 6.66 %      PHQ-9: Recent Review Flowsheet Data    Depression screen [facility] 2/9 05/06/2016 10/05/2015 02/06/2015 02/05/2014   Decreased Interest 0 0 0 0   Down, Depressed, Hopeless 0 0 0 0   PHQ - 2 Score 0 0 0 0   Altered sleeping 2 1 - -   Tired, decreased energy 2 2 - -   Change in appetite 1 2 - -   Feeling bad or failure about yourself  0 0 - -   Trouble  concentrating 0 0 - -   Moving slowly or fidgety/restless 0 0 - -   Suicidal thoughts 0 0 - -   PHQ-9 Score 5 5 - -   Difficult doing work/chores Not difficult at all Not difficult at all - -     Interpretation of Total Score  Total Score Depression Severity:  1-4 = Minimal depression, 5-9 = Mild depression, 10-14 = Moderate depression, 15-19 = Moderately severe depression, 20-27 = Severe depression   Psychosocial Evaluation and Intervention:     Psychosocial Evaluation - 02/01/16 1733      Psychosocial Evaluation & Interventions   Comments Counselor met with Mr. Mcgreal [facility]) today for initial psychosocial evaluation.  He is an 83 yr old who was in this program 11 years ago when he had 5 by-passes done.  He has blockages now and reports Drs are unable to do stents so ordered this program again.  [name] has a strong support system with a spouse of (6) years; an adult child close by and active involvement in his local church.  He has arthritis and type 2 diabetes which has been a challenge for him lately as his Dr. Ricard Dillon his insulin prescription 2 months ago and he has had more ups and downs with his sugar levels.  He also reports he is not  sleeping great over the past year with waking up and difficulty getting back to sleep.  He is tired as a result and has decreased appetite as well.  [name] denies a history of depression or anxiety and states he is typically in a positive mood.  He has goals to "increase circulation and open up those blockages" with exercise.  He would like to return to riding his bike outside 30-60 mins 3-5X per week once he completes this program.   Staff will be following with [name] throughout the course of this program.     Continue Psychosocial Services  Yes  [name] may need to be seen by a medical provider for his insulin prescription and his sleep challenges which are impacting his overall health.  Staff will follow      Psychosocial Re-Evaluation:     Psychosocial Re-Evaluation    [place] Name 01/04/16 1359 04/13/16 1717           Psychosocial Re-Evaluation   Comments "[name]" Shirline Frees said "I try not to get stressed too much". We discussed how riding his bicycle in his neighborhood is good for exercise and to decrease any stress.  [name] said he tries to smile alot and at age 52 just tries to not worry and be happy.       Interventions Encouraged to attend Cardiac Rehabilitation for the exercise  -         Psychosocial Discharge (Final Psychosocial Re-Evaluation):     Psychosocial Re-Evaluation - 04/13/16 1717      Psychosocial Re-Evaluation   Comments [name] said he tries to smile alot and at age 65 just tries to not worry and be happy.       Vocational Rehabilitation: Provide vocational rehab assistance to qualifying candidates.   Vocational Rehab Evaluation & Intervention:   Education: Education Goals: Education classes will be provided on a weekly basis, covering required topics. Participant will state understanding/return demonstration of topics presented.  Learning Barriers/Preferences:   Education Topics: General Nutrition Guidelines/Fats and Fiber: -Group instruction provided by verbal,  written material, models and posters to present the general guidelines for heart healthy nutrition. Gives an explanation and  review of dietary fats and fiber.   Controlling Sodium/Reading Food Labels: -Group verbal and written material supporting the discussion of sodium use in heart healthy nutrition. Review and explanation with models, verbal and written materials for utilization of the food label.   Exercise Physiology & Risk Factors: - Group verbal and written instruction with models to review the exercise physiology of the cardiovascular system and associated critical values. Details cardiovascular disease risk factors and the goals associated with each risk factor. Flowsheet Row Cardiac Rehab from 04/18/2016 in [facility] Cardiac and Pulmonary Rehab  Date  01/13/16  Educator  AS  Instruction Review Code  2- meets goals/outcomes      Aerobic Exercise & Resistance Training: - Gives group verbal and written discussion on the health impact of inactivity. On the components of aerobic and resistive training programs and the benefits of this training and how to safely progress through these programs. Flowsheet Row Cardiac Rehab from 04/18/2016 in [facility] Cardiac and Pulmonary Rehab  Date  02/01/16  Educator  K. Amedeo Plenty, RN  Instruction Review Code  2- meets goals/outcomes      Flexibility, Balance, General Exercise Guidelines: - Provides group verbal and written instruction on the benefits of flexibility and balance training programs. Provides general exercise guidelines with specific guidelines to those with heart or lung disease. Demonstration and skill practice provided. Flowsheet Row Cardiac Rehab from 04/18/2016 in [facility] Cardiac and Pulmonary Rehab  Date  12/14/15  Educator  [facility]  Instruction Review Code  2- meets goals/outcomes      Stress Management: - Provides group verbal and written instruction about the health risks of elevated stress, cause of high stress, and healthy ways to reduce  stress. Flowsheet Row Cardiac Rehab from 04/18/2016 in [facility] Cardiac and Pulmonary Rehab  Date  04/13/16  Educator  [facility]  Instruction Review Code  2- meets goals/outcomes      Depression: - Provides group verbal and written instruction on the correlation between heart/lung disease and depressed mood, treatment options, and the stigmas associated with seeking treatment.   Anatomy & Physiology of the Heart: - Group verbal and written instruction and models provide basic cardiac anatomy and physiology, with the coronary electrical and arterial systems. Review of: AMI, Angina, Valve disease, Heart Failure, Cardiac Arrhythmia, Pacemakers, and the ICD. Flowsheet Row Cardiac Rehab from 04/18/2016 in [facility] Cardiac and Pulmonary Rehab  Date  02/08/16  Educator  SB  Instruction Review Code  2- meets goals/outcomes      Cardiac Procedures: - Group verbal and written instruction and models to describe the testing methods done to diagnose heart disease. Reviews the outcomes of the test results. Describes the treatment choices: Medical Management, Angioplasty, or Coronary Bypass Surgery. Flowsheet Row Cardiac Rehab from 04/18/2016 in [facility] Cardiac and Pulmonary Rehab  Date  04/18/16  Educator  CE  Instruction Review Code  2- meets goals/outcomes      Cardiac Medications: - Group verbal and written instruction to review commonly prescribed medications for heart disease. Reviews the medication, class of the drug, and side effects. Includes the steps to properly store meds and maintain the prescription regimen.   Go Sex-Intimacy & Heart Disease, Get SMART - Goal Setting: - Group verbal and written instruction through game format to discuss heart disease and the return to sexual intimacy. Provides group verbal and written material to discuss and apply goal setting through the application of the S.M.A.R.T. Method. Flowsheet Row Cardiac Rehab from 04/18/2016 in [facility] Cardiac and Pulmonary Rehab  Date  04/18/16  Educator  CE  Instruction Review Code  2- meets goals/outcomes      Other Matters of the Heart: - Provides group verbal, written materials and models to describe Heart Failure, Angina, Valve Disease, and Diabetes in the realm of heart disease. Includes description of the disease process and treatment options available to the cardiac patient. Flowsheet Row Cardiac Rehab from 04/18/2016 in [facility] Cardiac and Pulmonary Rehab  Date  04/11/16  Educator  C. Enterkin, RN  Instruction Review Code  2- meets goals/outcomes      Exercise & Equipment Safety: - Individual verbal instruction and demonstration of equipment use and safety with use of the equipment. Flowsheet Row Cardiac Rehab from 04/18/2016 in [facility] Cardiac and Pulmonary Rehab  Date  10/05/15  Educator  SB  Instruction Review Code  2- meets goals/outcomes      Infection Prevention: - Provides verbal and written material to individual with discussion of infection control including proper hand washing and proper equipment cleaning during exercise session. Flowsheet Row Cardiac Rehab from 04/18/2016 in [facility] Cardiac and Pulmonary Rehab  Date  10/05/15  Educator  SB  Instruction Review Code  2- meets goals/outcomes      Falls Prevention: - Provides verbal and written material to individual with discussion of falls prevention and safety.   Diabetes: - Individual verbal and written instruction to review signs/symptoms of diabetes, desired ranges of glucose level fasting, after meals and with exercise. Advice that pre and post exercise glucose checks will be done for 3 sessions at entry of program. Flowsheet Row Cardiac Rehab from 04/18/2016 in [facility] Cardiac and Pulmonary Rehab  Date  10/05/15  Educator  SB  Instruction Review Code  2- meets goals/outcomes       Knowledge Questionnaire Score:     Knowledge Questionnaire Score - 05/06/16 0955      Knowledge Questionnaire Score   Pre Score 22/28   Post Score 21/28       Core Components/Risk Factors/Patient Goals at Admission:   Core Components/Risk Factors/Patient Goals Review:      Goals and Risk Factor Review    Row Name 01/04/16 1353 01/21/16 1710 04/13/16 1715         Core Components/Risk Factors/Patient Goals Review   Personal Goals Review Weight Management/Obesity;Increase Strength and Stamina;Lipids;Hypertension;Diabetes Weight Management/Obesity;Diabetes;Sedentary Diabetes;Weight Management/Obesity;Sedentary;Lipids     Review "[name]" said he tries to ride his bicycle in his neighborhood the days he is not attending Cardiac Rehab. He said he still "gets out of air but realizes that the MD told him there are some clogged heart arteries that they can't totally fix". He said his blood pressure has been up lately and he is going to get himself a new blood pressure cufff. [name] admits to having a history of back pain and had a injection in his knee last month that increased his blood sugars but he reports his HBA1c is usually about 6.9. [name] said he is not regular enough with his attendance with his Cardiac rehab to get his stamina back but he hopes to do better. I offered the 8am Tuesday /Thursday Cardiac REhab for a make up day but [name] said he is not a morning person. He is also having company from [place] this evening but he hopes to attend Cardiac Rehab this evening. [name] thanked me for my phone call to him to check up with him on an individual basis.  [name] has been having high and low blood sugar episodes.  He has called  his Dr but has not yet received a call back.  He was instructed to call again.  His insulin was changed a few weeks ago.  HbA1C was 7.0 at last check.  His weight is down and he has been eating less and riding his bike outdoors most days at home.  he would like to meet with the RD. [name] said he lost from 15 lbs and his goal is still 200lbs. He admits when he was sick with his bad cough he didn't exercise plus he rides his bicycle alot and is  hoping for warm weather soon. [name] said he eats healthy most of the time to keep his cholestrol level good.      Expected Outcomes Cont Hearth healthy lifestyle. Increased stamina. Better control of his blood pressure (please see telemetry sheets for his blood pressure).  [name] will get his BG under better control with the help of his Dr. and learning better dietary habits. Cont heart healthy diabetic lifestyle.         Core Components/Risk Factors/Patient Goals at Discharge (Final Review):      Goals and Risk Factor Review - 04/13/16 1715      Core Components/Risk Factors/Patient Goals Review   Personal Goals Review Diabetes;Weight Management/Obesity;Sedentary;Lipids   Review [name] said he lost from 15 lbs and his goal is still 200lbs. He admits when he was sick with his bad cough he didn't exercise plus he rides his bicycle alot and is hoping for warm weather soon. [name] said he eats healthy most of the time to keep his cholestrol level good.    Expected Outcomes Cont heart healthy diabetic lifestyle.       ITP Comments:     ITP Comments    Row Name 12/01/15 1537 12/16/15 0659 01/13/16 0628 01/14/16 1753 02/10/16 0626   ITP Comments ITP ready for initial signature.  Documentation of diagnosis found in Office Note 11/23/2015 [facility] 30 day review. Continue with ITP unless changes noted by Medical Director at signature of review. Has started classes in this month 30 day review completed for Medical Director physician review and signature. Continue ITP unless changes made by physician. intermittent attendance personal reasons After exercise Labrian was not feeling well and his blood sugar dropped to 51 mg/dL. He was given 4 glucose tabs and his blood sugar went up to 93 mg/dL. He ate PB crackers before leaving to go home and have dinner.  30 day review completed for review by Dr Emily Filbert.  Continue with ITP unless changes noted by Dr Sabra Heck.   Row Name 03/04/16 1024 03/08/16 0635 04/06/16 0642 05/04/16  0626     ITP Comments Called to check on [name].  He has been out with cold symptoms and hacking cough for two weeks.  He is planning to call the doctor about it again. 30 day review. Continue with ITP unless directed changes per Medical Director review. 30 day review. Continue with ITP unless directed changes per Medical Director review. LAst visit 02/15/2016 30 day review. Continue with ITP unless directed changes per Medical Director review. [name] returns to complete program.       Comments: Discharge ITP

## 2016-05-06 NOTE — Patient Instructions (Signed)
It is not entirely clear what the major cause of your shortness of breath is. By your lung examination and chest x-ray, I do not see any obvious signs of a lung disease.   I have given you to samples of an inhaler medicine called Anoro. Use this for 1 week on, then 1 week off, 1 week on, one-week off. When you come back, I want to to be able to tell me whether you can discern any difference and your level of shortness of breath  I have ordered a lung function tests and I will track down the results of your recent 6 minute walk  Follow-up in 4-6 weeks

## 2016-05-06 NOTE — Progress Notes (Signed)
Discharge Summary  Patient Details  Name: [name] [name] MRN: [id number] Date of Birth: 1934/01/12 Referring Provider:   Flowsheet Row Cardiac Rehab from 10/05/2015 in [facility]  Referring Provider  Ida Rogue MD       Number of Visits: 21/36  Reason for Discharge:  Early Exit:  Personal and Attendance  Smoking History:  History  Smoking Status  . Never Smoker  Smokeless Tobacco  . Never Used    Diagnosis:  Stable angina ([place])  ADL UCSD:   Initial Exercise Prescription:   Discharge Exercise Prescription (Final Exercise Prescription Changes):     Exercise Prescription Changes - 04/28/16 1300      Response to Exercise   Blood Pressure (Admit) 128/80   Blood Pressure (Exercise) 160/80   Blood Pressure (Exit) 140/78   Heart Rate (Admit) 83 bpm   Heart Rate (Exercise) 89 bpm   Heart Rate (Exit) 76 bpm   Rating of Perceived Exertion (Exercise) 12   Duration Progress to 45 minutes of aerobic exercise without signs/symptoms of physical distress   Intensity THRR unchanged     Progression   Progression Continue to progress workloads to maintain intensity without signs/symptoms of physical distress.   Average METs 2.39     Resistance Training   Training Prescription Yes   Weight 3   Reps 10-15     Interval Training   Interval Training No     Treadmill   MPH 2.5   Grade 0   Minutes 15   METs 2.91     Bike   Level 0.5   Minutes 15   METs 1.87      Functional Capacity:     6 Minute Walk    Row Name 05/05/16 1723         6 Minute Walk   Phase Discharge     Distance 1100 feet     Walk Time 6 minutes     # of Rest Breaks 0     MPH 2.08     METS 2.47     RPE 12     VO2 Peak 8.7     Symptoms No     Resting HR 54 bpm     Resting BP 142/74     Max Ex. HR 107 bpm     Max Ex. BP 184/68        Psychological, QOL, Others - Outcomes: PHQ 2/9: Depression screen [facility] 2/9 05/06/2016 10/05/2015 02/06/2015 02/05/2014   Decreased Interest 0 0 0 0  Down, Depressed, Hopeless 0 0 0 0  PHQ - 2 Score 0 0 0 0  Altered sleeping 2 1 - -  Tired, decreased energy 2 2 - -  Change in appetite 1 2 - -  Feeling bad or failure about yourself  0 0 - -  Trouble concentrating 0 0 - -  Moving slowly or fidgety/restless 0 0 - -  Suicidal thoughts 0 0 - -  PHQ-9 Score 5 5 - -  Difficult doing work/chores Not difficult at all Not difficult at all - -    Quality of Life:     Quality of Life - 05/06/16 0956      Quality of Life Scores   Health/Function Pre 24.86 %   Health/Function Post 27.63 %   Health/Function % Change 11.14 %   Socioeconomic Pre 30 %   Socioeconomic Post 30 %   Socioeconomic % Change  0 %   Psych/Spiritual Pre 30 %  Psych/Spiritual Post 30 %   Psych/Spiritual % Change 0 %   Family Pre 25.2 %   Family Post 30 %   Family % Change 19.05 %   GLOBAL Pre 27.18 %   GLOBAL Post 28.99 %   GLOBAL % Change 6.66 %      Personal Goals: Goals established at orientation with interventions provided to work toward goal.    Personal Goals Discharge:     Goals and Risk Factor Review    Row Name 01/04/16 1353 01/21/16 1710 04/13/16 1715         Core Components/Risk Factors/Patient Goals Review   Personal Goals Review Weight Management/Obesity;Increase Strength and Stamina;Lipids;Hypertension;Diabetes Weight Management/Obesity;Diabetes;Sedentary Diabetes;Weight Management/Obesity;Sedentary;Lipids     Review "[name] [name]" said he tries to ride his bicycle in his neighborhood the days he is not attending Cardiac Rehab. He said he still "gets out of air but realizes that the MD told him there are some clogged heart arteries that they can't totally fix". He said his blood pressure has been up lately and he is going to get himself a new blood pressure cufff. [name] [name] admits to having a history of back pain and had a injection in his knee last month that increased his blood sugars but he reports his HBA1c is usually  about 6.9. [name] [name] said he is not regular enough with his attendance with his Cardiac rehab to get his stamina back but he hopes to do better. I offered the 8am Tuesday /Thursday Cardiac REhab for a make up day but [name] [name] said he is not a morning person. He is also having company from [place] this evening but he hopes to attend Cardiac Rehab this evening. [name] [name] thanked me for my phone call to him to check up with him on an individual basis.  [name] [name] has been having high and low blood sugar episodes.  He has called his Dr but has not yet received a call back.  He was instructed to call again.  His insulin was changed a few weeks ago.  HbA1C was 7.0 at last check.  His weight is down and he has been eating less and riding his bike outdoors most days at home.  he would like to meet with the RD. [name] [name] said he lost from 15 lbs and his goal is still 200lbs. He admits when he was sick with his bad cough he didn't exercise plus he rides his bicycle alot and is hoping for warm weather soon. [name] [name] said he eats healthy most of the time to keep his cholestrol level good.      Expected Outcomes Cont Hearth healthy lifestyle. Increased stamina. Better control of his blood pressure (please see telemetry sheets for his blood pressure).  [name] [name] will get his BG under better control with the help of his Dr. and learning better dietary habits. Cont heart healthy diabetic lifestyle.         Nutrition & Weight - Outcomes:      Post Biometrics - 05/05/16 1722       Post  Biometrics   Height 5' 11.8" (1.824 m)   Weight 218 lb (98.9 kg)   Waist Circumference 44 inches   Hip Circumference 46.5 inches   Waist to Hip Ratio 0.95 %   BMI (Calculated) 29.8   Single Leg Stand 29 seconds      Nutrition:     Nutrition Therapy & Goals - 01/04/16 1342      Nutrition Therapy   Diet Diabetes diet (takes 2 insulin  injections a day)    Drug/Food Interactions Statins/Certain Fruits     Personal Nutrition Goals   Comments "[name] [name]" said he knows  what to eat.       Nutrition Discharge:     Nutrition Assessments - 05/06/16 0954      Rate Your Plate Scores   Pre Score 58   Pre Score % 64 %   Post Score 60   Post Score % 68.9 %   % Change 4.9 %      Education Questionnaire Score:     Knowledge Questionnaire Score - 05/06/16 0955      Knowledge Questionnaire Score   Pre Score 22/28   Post Score 21/28      Goals reviewed with patient; copy given to patient.

## 2016-05-06 NOTE — Progress Notes (Signed)
PULMONARY CONSULT NOTE  Requesting MD/Service: Rockey Situ Date of initial consultation: 05/06/2016 Reason for consultation: Unexplained dyspnea  PT PROFILE: 83 y.o. M never smoker with extensive history of coronary artery disease referred by Dr. Penni Homans for evaluation of progressive exertional dyspnea over 5-6 years  HPI:  As above. Mr. Frelich is an 83 year old gentleman with no prior pulmonary disease documented. He is referred for evaluation of exertional dyspnea. In his words, "I am not breathing well". This has been a problem that has progressed slowly over the past 5-6 years. It is noticeable to those around him including his wife. He describes his exercise dyspnea as class II maintaining difficulty walking up inclines. He suffered a "bronchitis" around Christmas time that lingered for several weeks. At one point he presented to the emergency department where he received nebulized bronchodilators with improvement. He was discharged home with albuterol metered-dose inhaler which he used for a while. This helped at the time but he is not presently using it. He never smoked and has no significant occupational exposures. Has a child he was exposed to extensive secondhand smoke in the home. He denies chest pain cough hemoptysis. He has no orthopnea or paroxysmal nocturnal dyspnea. He does have mild chronic lower extremity edema in both feet and ankles, worse on the left.  Past Medical History:  Diagnosis Date  . Cataract   . Coronary artery disease    a. 4v CABG 01/2005; b. cath 06/05/13 showed patent grafts: LIMA-LAD, VG-D, VG-OM3, VG-dRCA, nl filling pressures, nl LV fxn  . Diastolic dysfunction    a. echo 0000000: nl systolic fxn, mild LVH, diastolic relaxation abnormality, mildly enlarged LA, mild Ao insufficiency  . ED (erectile dysfunction)   . GERD (gastroesophageal reflux disease)   . Hyperlipidemia   . Hypertension   . Hypertrophy of prostate without urinary obstruction and other lower urinary  tract symptoms (LUTS)   . IDDM (insulin dependent diabetes mellitus) ([place]) 2000  . Osteoarthritis   . Perirectal fistula   . Tubular adenoma of colon 07/2012    Past Surgical History:  Procedure Laterality Date  . CARDIAC CATHETERIZATION  06/05/2013   [facility].   Marland Kitchen CARDIAC CATHETERIZATION N/A 09/24/2015   Procedure: Right Heart Cath and Coronary/Graft Angiography;  Surgeon: Minna Merritts, MD;  Location: [place] [facility];  Service: Cardiovascular;  Laterality: N/A;  . CATARACT EXTRACTION  sept 2013   right  . CHOLECYSTECTOMY  05/15/2011   Procedure: LAPAROSCOPIC CHOLECYSTECTOMY;  Surgeon: Rolm Bookbinder, MD;  Location: [place];  Service: General;  Laterality: N/A;  . COLONOSCOPY W/ POLYPECTOMY    . CORONARY ARTERY BYPASS GRAFT  2007   x 4  . FOOT SURGERY  2012   right foot  . JOINT REPLACEMENT  8/10   Right THR--[name]  . LEFT AND RIGHT HEART CATHETERIZATION WITH CORONARY/GRAFT ANGIOGRAM N/A 06/05/2013   Procedure: LEFT AND RIGHT HEART CATHETERIZATION WITH Beatrix Fetters;  Surgeon: Peter M Martinique, MD;  Location: [place] [facility];  Service: Cardiovascular;  Laterality: N/A;  . LUMBAR LAMINECTOMY  1989  . Prostate photovaporization  5/16   Dr Budd Palmer    MEDICATIONS: I have reviewed all medications and confirmed regimen as documented  Social History   Social History  . Marital status: Divorced    Spouse name: N/A  . Number of children: 4  . Years of education: N/A   Occupational History  . 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 Smoking status:  Never Smoker  . Smokeless tobacco: Never Used  . Alcohol use Yes     Comment: occ cocktail  . Drug use: No  . Sexual activity: Not on file   Other Topics Concern  . Not on file   Social History Narrative   Goes by [name]   Has living will    Has designated Aletha Halim health care POA   Would accept resuscitation attempts   Would not want  tube feeds if cognitively unaware    Family History  Problem Relation Age of Onset  . Lung cancer Mother   . Heart disease Father   . Colon cancer Neg Hx     ROS: No fever, myalgias/arthralgias, unexplained weight loss or weight gain No new focal weakness or sensory deficits No otalgia, hearing loss, visual changes, nasal and sinus symptoms, mouth and throat problems No neck pain or adenopathy No abdominal pain, N/V/D, diarrhea, change in bowel pattern No dysuria, change in urinary pattern   Vitals:   05/06/16 0822  BP: (!) 142/70  Pulse: 68  SpO2: 97%  Weight: 217 lb (98.4 kg)     EXAM:  Gen: WDWN, No overt respiratory distress HEENT: NCAT, sclera white, oropharynx normal Neck: Supple without LAN, thyromegaly, JVD Lungs: Percussion is normal throughout, breath sounds are full with no adventitious sounds Cardiovascular: RRR, no murmurs noted Abdomen: Centripetal obesity, soft, nontender, normal BS Ext: without clubbing, cyanosis. There is trace left ankle edema. Neuro: CNs grossly intact, motor and sensory intact Skin: Limited exam, no lesions noted  DATA:   BMP Latest Ref Rng & Units 03/11/2016 09/21/2015 03/09/2015  Glucose 65 - 99 mg/dL 247(H) 177(H) 287(H)  BUN 6 - 20 mg/dL 20 30(H) 20  Creatinine 0.61 - 1.24 mg/dL 1.19 1.31(H) 1.17  BUN/Creat Ratio 10 - 22 - - -  Sodium 135 - 145 mmol/L 138 137 139  Potassium 3.5 - 5.1 mmol/L 4.3 4.7 4.3  Chloride 101 - 111 mmol/L 111 107 108  CO2 22 - 32 mmol/L 22 24 25   Calcium 8.9 - 10.3 mg/dL 8.8(L) 8.9 8.8(L)    CBC Latest Ref Rng & Units 03/11/2016 09/21/2015 03/09/2015  WBC 3.8 - 10.6 K/uL 5.8 7.5 6.3  Hemoglobin 13.0 - 18.0 g/dL 14.1 15.3 14.4  Hematocrit 40.0 - 52.0 % 39.8(L) 42.2 40.6  Platelets 150 - 440 K/uL 101(L) 114(L) 98(L)    CXR 03/11/2016:  No acute cardiac or pulmonary disease CT chest 05/24/2007: No evidence of pulmonary disease  IMPRESSION:     ICD-9-CM ICD-10-CM   1. Unexplained DOE 786.09 R06.09  Pulmonary Function Test [facility] Only  2. Mildly overweight 278.02 E66.3   3. CAD 414.05 I25.810    The major cause of his shortness of breath is unclear. There is no overt evidence of pulmonary disease.  PLAN:  1) I provided samples of Anoro inhaler to be used 1 week on, one-week off, repeat. When he returns we will determine whether bronchodilator therapy has helped the shortness of breath at all. 2) full pulm function tests ordered. 3) we will track down the results of a recent 6 minute walk performed in a cardiac rehabilitation program  4) we discussed dietary changes that can be made to affect weight loss 5) follow-up in 4-6 weeks to review PFTs and response to Aleda Grana, MD PCCM service Mobile [phone] Pager [phone] 05/06/2016

## 2016-05-12 ENCOUNTER — Ambulatory Visit: Payer: Medicare Other | Admitting: Family Medicine

## 2016-05-13 DIAGNOSIS — M545 Low back pain: Secondary | ICD-10-CM | POA: Diagnosis not present

## 2016-06-02 ENCOUNTER — Ambulatory Visit (INDEPENDENT_AMBULATORY_CARE_PROVIDER_SITE_OTHER): Payer: Medicare Other | Admitting: Family Medicine

## 2016-06-02 ENCOUNTER — Encounter: Payer: Self-pay | Admitting: Family Medicine

## 2016-06-02 VITALS — BP 120/60 | HR 75 | Temp 98.6°F | Ht 71.75 in | Wt 213.4 lb

## 2016-06-02 DIAGNOSIS — E785 Hyperlipidemia, unspecified: Secondary | ICD-10-CM | POA: Diagnosis not present

## 2016-06-02 DIAGNOSIS — I2510 Atherosclerotic heart disease of native coronary artery without angina pectoris: Secondary | ICD-10-CM

## 2016-06-02 DIAGNOSIS — E1122 Type 2 diabetes mellitus with diabetic chronic kidney disease: Secondary | ICD-10-CM | POA: Insufficient documentation

## 2016-06-02 DIAGNOSIS — N183 Chronic kidney disease, stage 3 unspecified: Secondary | ICD-10-CM | POA: Insufficient documentation

## 2016-06-02 DIAGNOSIS — E118 Type 2 diabetes mellitus with unspecified complications: Secondary | ICD-10-CM

## 2016-06-02 DIAGNOSIS — I10 Essential (primary) hypertension: Secondary | ICD-10-CM

## 2016-06-02 DIAGNOSIS — M1990 Unspecified osteoarthritis, unspecified site: Secondary | ICD-10-CM | POA: Insufficient documentation

## 2016-06-02 DIAGNOSIS — N400 Enlarged prostate without lower urinary tract symptoms: Secondary | ICD-10-CM | POA: Diagnosis not present

## 2016-06-02 LAB — COMPREHENSIVE METABOLIC PANEL
ALT: 15 U/L (ref 0–53)
AST: 16 U/L (ref 0–37)
Albumin: 3.8 g/dL (ref 3.5–5.2)
Alkaline Phosphatase: 155 U/L — ABNORMAL HIGH (ref 39–117)
BILIRUBIN TOTAL: 0.9 mg/dL (ref 0.2–1.2)
BUN: 21 mg/dL (ref 6–23)
CO2: 25 meq/L (ref 19–32)
CREATININE: 1.33 mg/dL (ref 0.40–1.50)
Calcium: 9.2 mg/dL (ref 8.4–10.5)
Chloride: 103 mEq/L (ref 96–112)
GFR: 54.91 mL/min — AB (ref >60.00)
Glucose, Bld: 388 mg/dL — ABNORMAL HIGH (ref 70–99)
Potassium: 4.6 mEq/L (ref 3.5–5.1)
Sodium: 134 mEq/L — ABNORMAL LOW (ref 135–145)
TOTAL PROTEIN: 6.7 g/dL (ref 6.0–8.3)

## 2016-06-02 LAB — LIPID PANEL
Cholesterol: 121 mg/dL (ref 0–200)
HDL: 32.5 mg/dL — AB (ref >39.00)
NONHDL: 88.42
Total CHOL/HDL Ratio: 4
Triglycerides: 260 mg/dL — ABNORMAL HIGH (ref 0.0–149.0)
VLDL: 52 mg/dL — ABNORMAL HIGH (ref 0.0–40.0)

## 2016-06-02 LAB — LDL CHOLESTEROL, DIRECT: LDL DIRECT: 65 mg/dL

## 2016-06-02 MED ORDER — METFORMIN HCL 500 MG PO TABS: 500.0000 mg | ORAL_TABLET | Freq: Two times a day (BID) | ORAL | 3 refills | Status: DC

## 2016-06-02 MED ORDER — TRAMADOL HCL 50 MG PO TABS: 50.0000 mg | ORAL_TABLET | Freq: Four times a day (QID) | ORAL | 0 refills | Status: DC | PRN

## 2016-06-02 NOTE — Patient Instructions (Signed)
Increase the Novolin to 25 units twice daily.  Metformin twice daily.  Follow up in 2 months (at the end of May)  Take care  Dr. Lacinda Axon

## 2016-06-02 NOTE — Assessment & Plan Note (Signed)
Reasonably well controlled but given the fact that he's doing well at this time I would advocate for a lower goal. Increasing Novolin N to 25 units twice a day. Adding back metformin.

## 2016-06-02 NOTE — Assessment & Plan Note (Signed)
Stable  Continue finasteride

## 2016-06-02 NOTE — Assessment & Plan Note (Signed)
Stable on Coreg, Ramipril, Lasix.

## 2016-06-02 NOTE — Assessment & Plan Note (Signed)
Has been stable. Labs today. Continue Lipitor. May need dose increase based on cholesterol panel.

## 2016-06-02 NOTE — Progress Notes (Signed)
Pre visit review using our clinic review tool, if applicable. No additional management support is needed unless otherwise documented below in the visit note. 

## 2016-06-02 NOTE — Assessment & Plan Note (Signed)
Currently stable.  Continue current medications. 

## 2016-06-02 NOTE — Progress Notes (Signed)
Subjective:  Patient ID: [name] [name], male    DOB: 1933-12-06  Age: 83 y.o. MRN: [id number]  CC: Establish care with me (he is not a new patient as he has been seen in 2016 at [facility])  HPI:  83 year old male with CAD status post CABG, hypertension, hyperlipidemia, DM-2, CKD III (last GFR was 56), Chronic diastolic CHF, BPH presents to establish care.  Hypertension  At goal on Coreg, Ramipril, Lasix.  DM-2  Has been seeing endocrinology. Last A1c was 7.8.  He is currently on Novolin N 23 units BID and Amaryl.  Not on Metformin (unsure why).  Sugars uncontrolled at home (200's fasting).  HLD  Has been well controlled on Lipitor.  Patient states that he is currently taking 20 mg every other day.   CAD status post CABG   Is on appropriate therapy with beta blocker, ACE inhibitor, Lipitor, aspirin.  Patient has ongoing dyspnea with exertion. Cardiology feels that this is related to angina. He is currently seeing pulmonology to rule out pulmonary cause. He is trying to exercise more. No current reports of chest pain.   BPH  Stable currently on Proscar. Status post TURP.  Social Hx   Social History   Social History  . Marital status: Divorced    Spouse name: N/A  . Number of children: 4  . Years of education: N/A   Occupational History  . 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 Smoking status: Never Smoker  . Smokeless tobacco: Never Used  . Alcohol use Yes     Comment: occ cocktail  . Drug use: No  . Sexual activity: Not Asked   Other Topics Concern  . None   Social History Narrative   Goes by [name]   Has living will    Has designated [name] as health care POA   Would accept resuscitation attempts   Would not want tube feeds if cognitively unaware    Review of Systems  Respiratory: Positive for shortness of breath.   Musculoskeletal: Positive for arthralgias and myalgias.     Objective:  BP 120/60   Pulse 75   Temp 98.6 F (37 C) (Oral)   Ht 5' 11.75" (1.822 m)   Wt 213 lb 6 oz (96.8 kg)   SpO2 97%   BMI 29.14 kg/m   BP/Weight 06/02/2016 6/0/7371 0/08/2692  Systolic BP 854 627 -  Diastolic BP 60 70 -  Wt. (Lbs) 213.38 217 218  BMI 29.14 29.59 29.73   Physical Exam  Constitutional: He is oriented to person, place, and time. He appears well-developed. No distress.  Cardiovascular: Normal rate and regular rhythm.   Pulmonary/Chest: Effort normal and breath sounds normal.  Abdominal: Soft. He exhibits no distension. There is no tenderness. There is no rebound and no guarding.  Neurological: He is alert and oriented to person, place, and time.  Psychiatric: He has a normal mood and affect.  Vitals reviewed.  Lab Results  Component Value Date   WBC 5.8 03/11/2016   HGB 14.1 03/11/2016   HCT 39.8 (L) 03/11/2016   PLT 101 (L) 03/11/2016   GLUCOSE 247 (H) 03/11/2016   CHOL 111 02/06/2015   TRIG 92.0 02/06/2015   HDL 31.90 (L) 02/06/2015   LDLCALC 61 02/06/2015   ALT 18 03/09/2015   AST 20 03/09/2015   NA 138 03/11/2016   K 4.3 03/11/2016   CL 111 03/11/2016   CREATININE 1.19  03/11/2016   BUN 20 03/11/2016   CO2 22 03/11/2016   TSH 2.080 06/03/2013   INR 1.10 09/21/2015   HGBA1C 7.0 (A) 11/07/2014    Assessment & Plan:   Problem List Items Addressed This Visit    BPH (benign prostatic hyperplasia)    Stable. Continue finasteride.      CAD (coronary artery disease)    Currently stable. Continue current medications.      Essential hypertension - Primary    Stable on Coreg, Ramipril, Lasix.       Relevant Orders   Comp Met (CMET)   Hyperlipidemia    Has been stable. Labs today. Continue Lipitor. May need dose increase based on cholesterol panel.      Relevant Orders   Lipid panel   Type 2 diabetes mellitus with complications ([place])    Reasonably well controlled but given the fact that he's doing well at this time I would  advocate for a lower goal. Increasing Novolin N to 25 units twice a day. Adding back metformin.      Relevant Medications   metFORMIN (GLUCOPHAGE) 500 MG tablet      Meds ordered this encounter  Medications  . traMADol (ULTRAM) 50 MG tablet    Sig: Take 1 tablet (50 mg total) by mouth every 6 (six) hours as needed.    Dispense:  90 tablet    Refill:  0  . metFORMIN (GLUCOPHAGE) 500 MG tablet    Sig: Take 1 tablet (500 mg total) by mouth 2 (two) times daily with a meal.    Dispense:  180 tablet    Refill:  3    Follow-up: Return in about 2 months (around 08/02/2016).  [place]

## 2016-06-07 ENCOUNTER — Ambulatory Visit: Payer: Medicare Other

## 2016-06-09 ENCOUNTER — Ambulatory Visit: Payer: Medicare Other | Attending: Pulmonary Disease

## 2016-06-14 ENCOUNTER — Ambulatory Visit: Payer: Medicare Other | Admitting: Pulmonary Disease

## 2016-06-23 NOTE — Progress Notes (Signed)
In Cardiology Office Note  Date:  06/24/2016   ID:  [name] [name], [name] [name] 01/04/1934, MRN [id number]  PCP:  Coral Spikes, DO   Chief Complaint  Patient presents with  . other    6 month f/u c/o joint pain. Meds reviewed verbally with pt.    HPI:  83 year old gentleman with a history of  coronary artery disease, bypass surgery in November 2006, Stress test March 2017, cath 09/2015, medical management EF 55% in 12/2013 diabetes, numbers running high obesity,  hyperlipidemia,  hypertension,  erectile dysfunction Chronic SOB who presents for routine followup of his coronary artery disease.  Bad bronchitis earlier in the year  Chronic baseline, shortness of breath on exertion Previously scheduled to have PFTs. This was canceled as the machine was broken, needs to be rescheduled  Joint pain, shoulder Leg cramps at night Takes lipitor 20 every other day, total 121 LDL 65  Problems with insomnia, cannot fall sleep after going to the bathroom at 2 AM  Chronic hand, back pain  Started on metformin, 1/2 pill , higher dose gave him diarrhea  completed pulmonary rehab, but only went once a week, not on a regular basis secondary to travel  Lab work reviewed with him in detail Total chol 111, LDL 61  Having Left stomach pain, nerve pain. 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 Sees neurology at [facility]  He did have workup for this pain including CT scan which was reportedly benign (did show gallstones) EKG personally reviewed by myself on todays visit Shows no sinus rhythm with rate 65 bpm no significant ST or T-wave changes  Other past medical history reviewed On his last clinic visit, he reported worsening shortness of breath over the past 6 months  Previous echocardiogram in 2015 which was essentially normal  he denies any leg edema  CT scan of his abdomen reviewed showing mild diffuse aortic atherosclerosis Review of lab work showing elevated  glucose levels but does report hemoglobin A1c 6.9  Reports he saw specialist for his back in [place], had MRI showing arthritis no spinal stenosis He is considering starting meloxicam Was previously on Celebrex. This is more expensive  Previously took a trip [place] with long car trip for 10 hours x2 days each way. He had significant lower extremity edema. He took additional doses of Lasix with improvement of his swelling. He reports having chronic swelling of the left lower extremity, swelling of the right lower extremity has resolved. Doppler of the lower extremity on the left showed no DVT  Periodically has subxiphoid pain that is sharp, unclear if it is associated with food. No longer taking a statin as this caused leg cramps He continues to bike 3-6 miles per day. Unable to walk long distances secondary to chronic knee and hip pain  Hemoglobin A1c 6.7.   participating in a study at [facility] where they manage his insulin . 6 Total cholesterol 112, LDL 61 with no cholesterol medication  catheterization for chest pain 06/05/2013 that showed patent grafts with LIMA to the LAD, vein graft to the diagonal, vein graft to the OM 3, vein graft to the distal RCA. Normal filling pressures and normal LV function.  Echocardiogram April 2009 shows normal systolic function with mild LVH, diastolic relaxation abnormality, mildly enlarged left atrium, mild aortic insufficiency. Cardiac CT scan in March 2009 showed severe coronary artery disease with patent grafts x4, saphenous vein graft to the PDA, OM, diagonal and a LIMA graft to the LAD.  PMH:   has a past medical history of Cataract; Coronary artery disease; Diastolic dysfunction; ED (erectile dysfunction); GERD (gastroesophageal reflux disease); Hyperlipidemia; Hypertension; IDDM (insulin dependent diabetes mellitus) ([name]) (2000); Osteoarthritis; Perirectal fistula; and Tubular adenoma of colon (07/2012).  PSH:    Past Surgical History:   Procedure Laterality Date  . CARDIAC CATHETERIZATION  06/05/2013   [facility].   Marland Kitchen CARDIAC CATHETERIZATION N/A 09/24/2015   Procedure: Right Heart Cath and Coronary/Graft Angiography;  Surgeon: Minna Merritts, MD;  Location: [name];  Service: Cardiovascular;  Laterality: N/A;  . CATARACT EXTRACTION  sept 2013   right  . CHOLECYSTECTOMY  05/15/2011   Procedure: LAPAROSCOPIC CHOLECYSTECTOMY;  Surgeon: Rolm Bookbinder, MD;  Location: [place];  Service: General;  Laterality: N/A;  . COLONOSCOPY W/ POLYPECTOMY    . CORONARY ARTERY BYPASS GRAFT  2007   x 4  . FOOT SURGERY  2012   right foot  . JOINT REPLACEMENT  8/10   Right THR--[name]  . LEFT AND RIGHT HEART CATHETERIZATION WITH CORONARY/GRAFT ANGIOGRAM N/A 06/05/2013   Procedure: LEFT AND RIGHT HEART CATHETERIZATION WITH Beatrix Fetters;  Surgeon: Peter M Martinique, MD;  Location: [name] CATH LAB;  Service: Cardiovascular;  Laterality: N/A;  . LUMBAR LAMINECTOMY  1989  . Prostate photovaporization  5/16   Dr Budd Palmer    Current Outpatient Prescriptions  Medication Sig Dispense Refill  . acetaminophen (TYLENOL) 650 MG CR tablet Take 650 mg by mouth every 8 (eight) hours as needed for pain. For hand arthritis    . albuterol (PROVENTIL HFA;VENTOLIN HFA) 108 (90 Base) MCG/ACT inhaler Inhale 2 puffs into the lungs every 4 (four) hours as needed for wheezing or shortness of breath. 1 Inhaler 0  . aspirin 81 MG tablet Take 162 mg by mouth daily.     Marland Kitchen atorvastatin (LIPITOR) 40 MG tablet Take 1 tablet (40 mg total) by mouth every other day. 90 tablet 3  . BD INSULIN SYRINGE ULTRAFINE 31G X 15/64" 0.3 ML MISC USE AS DIRECTED 100 each 3  . carvedilol (COREG) 12.5 MG tablet TAKE ONE TABLET TWICE A DAY WITH MEALS. 180 tablet 3  . finasteride (PROSCAR) 5 MG tablet Take 1 tablet (5 mg total) by mouth daily. 90 tablet 1  . furosemide (LASIX) 20 MG tablet Take 1 tablet (20 mg total) by mouth 2 (two) times daily as needed. (Patient  taking differently: Take 20 mg by mouth daily. ) 60 tablet 6  . glimepiride (AMARYL) 1 MG tablet TAKE 1/2 TABLET AM DAILY.    . Magnesium 250 MG TABS Take by mouth daily.    . metFORMIN (GLUCOPHAGE) 500 MG tablet Take 1 tablet (500 mg total) by mouth 2 (two) times daily with a meal. 180 tablet 3  . omeprazole (PRILOSEC) 20 MG capsule TAKE 1 CAPSULE BY MOUTH TWO TIMES DAILY 60 capsule 11  . potassium chloride (K-DUR,KLOR-CON) 10 MEQ tablet TAKE 1 TABLET BY MOUTH DAILY 30 tablet 9  . ramipril (ALTACE) 10 MG capsule Take 1 capsule (10 mg total) by mouth daily. 90 capsule 3  . traMADol (ULTRAM) 50 MG tablet Take 1 tablet (50 mg total) by mouth every 6 (six) hours as needed. 90 tablet 0   No current facility-administered medications for this visit.      Allergies:   Cortisone   Social History:  The patient  reports that he has never smoked. He has never used smokeless tobacco. He reports that he drinks alcohol. He reports that he does  not use drugs.   Family History:   family history includes Heart disease in his father; Lung cancer in his mother.    Review of Systems: Review of Systems  Constitutional: Negative.   Respiratory: Positive for shortness of breath.   Cardiovascular: Negative.   Gastrointestinal: Negative.   Musculoskeletal: Negative.   Neurological: Negative.        Left flank nerve pain  Psychiatric/Behavioral: The patient has insomnia.   All other systems reviewed and are negative.    PHYSICAL EXAM: VS:  BP (!) 148/64 (BP Location: Left Arm, Patient Position: Sitting, Cuff Size: Normal)   Pulse 71   Ht 6\' 1"  (1.854 m)   Wt 214 lb 8 oz (97.3 kg)   BMI 28.30 kg/m  , BMI Body mass index is 28.3 kg/m. GEN: Well nourished, well developed, in no acute distress, obese  HEENT: normal  Neck: no JVD, carotid bruits, or masses Cardiac: RRR; no murmurs, rubs, or gallops,no edema  Respiratory:  clear to auscultation bilaterally, normal work of breathing GI: soft,  nontender, nondistended, + BS MS: no deformity or atrophy  Skin: warm and dry, no rash Neuro:  Strength and sensation are intact Psych: euthymic mood, full affect    Recent Labs: 03/11/2016: Hemoglobin 14.1; Platelets 101 06/02/2016: ALT 15; BUN 21; Creatinine, Ser 1.33; Potassium 4.6; Sodium 134    Lipid Panel Lab Results  Component Value Date   CHOL 121 06/02/2016   HDL 32.50 (L) 06/02/2016   LDLCALC 61 02/06/2015   TRIG 260.0 (H) 06/02/2016      Wt Readings from Last 3 Encounters:  06/24/16 214 lb 8 oz (97.3 kg)  06/02/16 213 lb 6 oz (96.8 kg)  05/06/16 217 lb (98.4 kg)       ASSESSMENT AND PLAN:   Angina pectoris ([place]) - Plan: EKG 12-Lead Chronic stable shortness of breath, secondary to stable angina, obesity, deconditioning PFTs pending Recommended regular exercise program such as water aerobics given his chronic back pain  Coronary artery disease involving native coronary artery of native heart with stable angina - Plan: EKG 12-Lead No further testing at this time, no ischemia on stress testing Stressed importance of dietary changes, weight loss  Chronic diastolic CHF (congestive heart failure) ([place]) - Plan: EKG 12-Lead Recommended he take Lasix for ankle swelling, weight gain  SOB (shortness of breath) - Plan: EKG 12-Lead Chronic shortness of breath,  No ischemia on stress testing. Possibly stable angina symptoms. Completed some of the rehabilitation Recommended he start regular exercise program   Total encounter time more than 25 minutes  Greater than 50% was spent in counseling and coordination of care with the patient   Disposition:   F/U  6 months   Orders Placed This Encounter  Procedures  . EKG 12-Lead     Signed, Esmond Plants, M.D., Ph.D. 06/24/2016  [place], [place]

## 2016-06-24 ENCOUNTER — Ambulatory Visit (INDEPENDENT_AMBULATORY_CARE_PROVIDER_SITE_OTHER): Payer: Medicare Other | Admitting: Cardiovascular Disease

## 2016-06-24 ENCOUNTER — Encounter: Payer: Self-pay | Admitting: Cardiovascular Disease

## 2016-06-24 VITALS — BP 148/64 | HR 71 | Ht 73.0 in | Wt 214.5 lb

## 2016-06-24 DIAGNOSIS — E118 Type 2 diabetes mellitus with unspecified complications: Secondary | ICD-10-CM

## 2016-06-24 DIAGNOSIS — Z951 Presence of aortocoronary bypass graft: Secondary | ICD-10-CM

## 2016-06-24 DIAGNOSIS — E782 Mixed hyperlipidemia: Secondary | ICD-10-CM | POA: Diagnosis not present

## 2016-06-24 DIAGNOSIS — N183 Chronic kidney disease, stage 3 unspecified: Secondary | ICD-10-CM

## 2016-06-24 DIAGNOSIS — I25118 Atherosclerotic heart disease of native coronary artery with other forms of angina pectoris: Secondary | ICD-10-CM

## 2016-06-24 DIAGNOSIS — I209 Angina pectoris, unspecified: Secondary | ICD-10-CM

## 2016-06-24 DIAGNOSIS — I10 Essential (primary) hypertension: Secondary | ICD-10-CM

## 2016-06-24 DIAGNOSIS — I5032 Chronic diastolic (congestive) heart failure: Secondary | ICD-10-CM

## 2016-06-24 NOTE — Patient Instructions (Signed)
Medication Instructions:   Please hold the atorvastatin for a few weeks To see if cramps and joints get better  Labwork:  No new labs needed  Testing/Procedures:  No further testing at this time   I recommend watching educational videos on topics of interest to you at:       [email or web address]  Enter code: [email or web address]    Follow-Up: It was a pleasure seeing you in the office today. Please call us if you have new issues that need to be addressed before your next appt.  [phone]  Your physician wants you to follow-up in: 6 months.  You will receive a reminder letter in the mail two months in advance. If you don't receive a letter, please call our office to schedule the follow-up appointment.  If you need a refill on your cardiac medications before your next appointment, please call your pharmacy.

## 2016-06-29 DIAGNOSIS — N400 Enlarged prostate without lower urinary tract symptoms: Secondary | ICD-10-CM | POA: Diagnosis not present

## 2016-07-07 ENCOUNTER — Ambulatory Visit: Payer: Medicare Other | Attending: Pulmonary Disease

## 2016-07-07 DIAGNOSIS — J449 Chronic obstructive pulmonary disease, unspecified: Secondary | ICD-10-CM | POA: Insufficient documentation

## 2016-07-07 DIAGNOSIS — R0609 Other forms of dyspnea: Secondary | ICD-10-CM

## 2016-07-07 DIAGNOSIS — R0600 Dyspnea, unspecified: Secondary | ICD-10-CM

## 2016-07-07 MED ORDER — ALBUTEROL SULFATE (2.5 MG/3ML) 0.083% IN NEBU
2.5000 mg | INHALATION_SOLUTION | Freq: Once | RESPIRATORY_TRACT | Status: AC
  Administered 2016-07-07: 2.5 mg via RESPIRATORY_TRACT
  Filled 2016-07-07: qty 3

## 2016-07-14 ENCOUNTER — Other Ambulatory Visit: Payer: Self-pay | Admitting: Cardiovascular Disease

## 2016-07-15 NOTE — Telephone Encounter (Signed)
Pt instructed to take Lasix 20mg  BID- Pt taking differently- once daily. Please advise on how to refill. Thank you.

## 2016-07-19 DIAGNOSIS — M25511 Pain in right shoulder: Secondary | ICD-10-CM | POA: Diagnosis not present

## 2016-07-19 DIAGNOSIS — G8929 Other chronic pain: Secondary | ICD-10-CM | POA: Diagnosis not present

## 2016-07-19 DIAGNOSIS — M150 Primary generalized (osteo)arthritis: Secondary | ICD-10-CM | POA: Diagnosis not present

## 2016-07-19 DIAGNOSIS — M79642 Pain in left hand: Secondary | ICD-10-CM | POA: Diagnosis not present

## 2016-07-19 DIAGNOSIS — M79641 Pain in right hand: Secondary | ICD-10-CM | POA: Diagnosis not present

## 2016-08-02 ENCOUNTER — Ambulatory Visit (INDEPENDENT_AMBULATORY_CARE_PROVIDER_SITE_OTHER): Payer: Medicare Other | Admitting: Family Medicine

## 2016-08-02 ENCOUNTER — Encounter: Payer: Self-pay | Admitting: Family Medicine

## 2016-08-02 VITALS — BP 110/58 | HR 74 | Temp 98.2°F | Resp 16 | Ht 71.75 in | Wt 207.5 lb

## 2016-08-02 DIAGNOSIS — R252 Cramp and spasm: Secondary | ICD-10-CM | POA: Diagnosis not present

## 2016-08-02 DIAGNOSIS — E118 Type 2 diabetes mellitus with unspecified complications: Secondary | ICD-10-CM | POA: Diagnosis not present

## 2016-08-02 DIAGNOSIS — E782 Mixed hyperlipidemia: Secondary | ICD-10-CM | POA: Diagnosis not present

## 2016-08-02 DIAGNOSIS — Z794 Long term (current) use of insulin: Secondary | ICD-10-CM | POA: Diagnosis not present

## 2016-08-02 DIAGNOSIS — I10 Essential (primary) hypertension: Secondary | ICD-10-CM | POA: Diagnosis not present

## 2016-08-02 LAB — POCT GLYCOSYLATED HEMOGLOBIN (HGB A1C): Hemoglobin A1C: 8.4

## 2016-08-02 MED ORDER — CYCLOBENZAPRINE HCL 10 MG PO TABS: 5.0000 mg | ORAL_TABLET | Freq: Every evening | ORAL | 0 refills | Status: DC | PRN

## 2016-08-02 MED ORDER — INSULIN NPH (HUMAN) (ISOPHANE) 100 UNIT/ML ~~LOC~~ SUSP: 25.0000 [IU] | Freq: Two times a day (BID) | SUBCUTANEOUS | 0 refills | Status: DC

## 2016-08-02 MED ORDER — ROSUVASTATIN CALCIUM 20 MG PO TABS: 20.0000 mg | ORAL_TABLET | Freq: Every day | ORAL | 3 refills | Status: DC

## 2016-08-02 NOTE — Assessment & Plan Note (Signed)
At goal. Continue Ramipril, Coreg, Lasix.

## 2016-08-02 NOTE — Patient Instructions (Signed)
Try the flexeril for the cramps.  Stop the lipitor and start the crestor.  Follow up after your Endo visit.  Take care  Dr. Lacinda Axon

## 2016-08-02 NOTE — Assessment & Plan Note (Signed)
At end of visit, patient decided to no longer see Endo and have me manage his diabetes. A1C 8.4 today. Patient to continue current meds and follow up with my in 1-2 weeks.

## 2016-08-02 NOTE — Progress Notes (Signed)
Subjective:  Patient ID: [name] [name], male    DOB: 08-26-1933  Age: 83 y.o. MRN: [id number]  CC: Follow up  HPI:  83 year old male with DM-2, HTN, HLD, CAD, CHF, CKD presents for follow up.  Issues are below.  DM-2  Uncontrolled.  Needs A1C.   Has been followed and managed by Endo.  Patient expresses desire to continue to follow with Endo.  Currently on Metformin and not tolerating due to GI side effects.  Also on NPH 25 units BID and Amaryl.  Hypertension  At goal on Ramipril, Lasix, Coreg.  Hyperlipidemia  Previously well controlled on Lipitor.  Lipitor recently discontinued due to cramping/arthralgias.  Need to discuss today.  Muscle cramps  Severe.  Worsened after recent steroid injection.  Worse at night.  Social Hx   Social History   Social History  . Marital status: Married    Spouse name: N/A  . Number of children: 4  . Years of education: N/A   Occupational History  . 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 Smoking status: Never Smoker  . Smokeless tobacco: Never Used  . Alcohol use Yes     Comment: occ cocktail  . Drug use: No  . Sexual activity: Not Asked   Other Topics Concern  . None   Social History Narrative   Goes by [name]   Has living will    Has designated [name] as health care POA   Would accept resuscitation attempts   Would not want tube feeds if cognitively unaware    Review of Systems  Constitutional: Negative.   Respiratory: Positive for shortness of breath.   Musculoskeletal: Positive for arthralgias.       Muscle cramps.   Objective:  BP (!) 110/58   Pulse 74   Temp 98.2 F (36.8 C) (Oral)   Resp 16   Ht 5' 11.75" (1.822 m)   Wt 207 lb 8 oz (94.1 kg)   SpO2 98%   BMI 28.34 kg/m   BP/Weight 08/02/2016 06/24/2016 5/57/3220  Systolic BP 254 270 623  Diastolic BP 58 64 60  Wt. (Lbs) 207.5 214.5 213.38  BMI 28.34 28.3 29.14    Physical  Exam  Constitutional: He is oriented to person, place, and time. He appears well-developed. No distress.  Cardiovascular: Normal rate and regular rhythm.   2/6 systolic murmur.  Pulmonary/Chest: Effort normal and breath sounds normal. He has no wheezes. He has no rales.  Neurological: He is alert and oriented to person, place, and time.  Psychiatric: He has a normal mood and affect.  Vitals reviewed.   Lab Results  Component Value Date   WBC 5.8 03/11/2016   HGB 14.1 03/11/2016   HCT 39.8 (L) 03/11/2016   PLT 101 (L) 03/11/2016   GLUCOSE 388 (H) 06/02/2016   CHOL 121 06/02/2016   TRIG 260.0 (H) 06/02/2016   HDL 32.50 (L) 06/02/2016   LDLDIRECT 65.0 06/02/2016   LDLCALC 61 02/06/2015   ALT 15 06/02/2016   AST 16 06/02/2016   NA 134 (L) 06/02/2016   K 4.6 06/02/2016   CL 103 06/02/2016   CREATININE 1.33 06/02/2016   BUN 21 06/02/2016   CO2 25 06/02/2016   TSH 2.080 06/03/2013   INR 1.10 09/21/2015   HGBA1C 8.4 08/02/2016    Assessment & Plan:   Problem List Items Addressed This Visit      Cardiovascular and Mediastinum  Essential hypertension    At goal. Continue Ramipril, Coreg, Lasix.       Relevant Medications   rosuvastatin (CRESTOR) 20 MG tablet     Endocrine   Type 2 diabetes mellitus with complications ([place]) - Primary    At end of visit, patient decided to no longer see Endo and have me manage his diabetes. A1C 8.4 today. Patient to continue current meds and follow up with my in 1-2 weeks.       Relevant Medications   rosuvastatin (CRESTOR) 20 MG tablet   insulin NPH Human (HUMULIN N) 100 UNIT/ML injection   Other Relevant Orders   POCT glycosylated hemoglobin (Hb A1C) (Completed)     Other   Muscle cramps    New problem. Trial of Flexeril QHS.      Hyperlipidemia    Switching to crestor.       Relevant Medications   rosuvastatin (CRESTOR) 20 MG tablet      Meds ordered this encounter  Medications  . cyclobenzaprine (FLEXERIL) 10 MG  tablet    Sig: Take 0.5-1 tablets (5-10 mg total) by mouth at bedtime as needed for muscle spasms.    Dispense:  30 tablet    Refill:  0  . rosuvastatin (CRESTOR) 20 MG tablet    Sig: Take 1 tablet (20 mg total) by mouth daily.    Dispense:  90 tablet    Refill:  3  . DISCONTD: insulin NPH Human (HUMULIN N) 100 UNIT/ML injection    Sig: Inject 20 Units into the skin 2 (two) times daily before a meal. 20 units in morning, and 20 units evening   . insulin NPH Human (HUMULIN N) 100 UNIT/ML injection    Sig: Inject 0.25 mLs (25 Units total) into the skin 2 (two) times daily before a meal.    Dispense:  3 mL    Refill:  0     Follow-up: Return in about 2 weeks (around 08/16/2016).  [place]

## 2016-08-02 NOTE — Assessment & Plan Note (Signed)
New problem. Trial of Flexeril QHS.

## 2016-08-02 NOTE — Assessment & Plan Note (Signed)
Switching to crestor.

## 2016-08-16 ENCOUNTER — Ambulatory Visit (INDEPENDENT_AMBULATORY_CARE_PROVIDER_SITE_OTHER): Payer: Medicare Other | Admitting: Family Medicine

## 2016-08-16 ENCOUNTER — Encounter: Payer: Self-pay | Admitting: Family Medicine

## 2016-08-16 DIAGNOSIS — E118 Type 2 diabetes mellitus with unspecified complications: Secondary | ICD-10-CM | POA: Diagnosis not present

## 2016-08-16 MED ORDER — INSULIN PEN NEEDLE 32G X 4 MM MISC: 11 refills | Status: DC

## 2016-08-16 MED ORDER — INSULIN GLARGINE 300 UNIT/ML ~~LOC~~ SOPN: 30.0000 [IU] | PEN_INJECTOR | Freq: Every day | SUBCUTANEOUS | 1 refills | Status: DC

## 2016-08-16 NOTE — Progress Notes (Signed)
Subjective:  Patient ID: [name] [name], male    DOB: [birth year]  Age: 83 y.o. MRN: [id number]  CC: Follow up on DM-23  HPI:  83 year old male with extensive past medical history including hypertension, CHF, CAD, DM 2, CKD, hyperlipidemia presents for follow-up regarding his diabetes.  DM  Blood sugars readings - Improving since steroid injection. Had hypoglycemic episode today.  Hypoglycemia - Yes. Had hypoglycemia earlier today as he did not eat (65). Improved with glucose tablet.   Medications - NPH 23 units BID.  Compliance - Yes.   Most recent A1C was 8.4  Social Hx   Social History   Social History  . Marital status: Married    Spouse name: N/A  . Number of children: 4  . Years of education: N/A   Occupational History  . 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 Smoking status: Never Smoker  . Smokeless tobacco: Never Used  . Alcohol use Yes     Comment: occ cocktail  . Drug use: No  . Sexual activity: Not Asked   Other Topics Concern  . None   Social History Narrative   [name]   Has living will    Has designated [name] [name] as health care POA   Would accept resuscitation attempts   Would not want tube feeds if cognitively unaware    Review of Systems  Constitutional: Negative.   Endocrine:       Hypoglycemia.   Objective:  BP 116/66   Pulse 69   Temp 97.6 F (36.4 C) (Oral)   Resp 16   Ht 5\' 11"  (1.803 m)   Wt 211 lb 8 oz (95.9 kg)   SpO2 98%   BMI 29.50 kg/m   BP/Weight 08/16/2016 08/02/2016 8/50/2774  Systolic BP 128 786 767  Diastolic BP 66 58 64  Wt. (Lbs) 211.5 207.5 214.5  BMI 29.5 28.34 28.3   Physical Exam  Constitutional: He is oriented to person, place, and time. He appears well-developed. No distress.  Cardiovascular: Normal rate and regular rhythm.   Pulmonary/Chest: Effort normal and breath sounds normal. He has no wheezes. He has no rales.  Neurological: He  is alert and oriented to person, place, and time.  Psychiatric: He has a normal mood and affect.  Vitals reviewed.  Lab Results  Component Value Date   WBC 5.8 03/11/2016   HGB 14.1 03/11/2016   HCT 39.8 (L) 03/11/2016   PLT 101 (L) 03/11/2016   GLUCOSE 388 (H) 06/02/2016   CHOL 121 06/02/2016   TRIG 260.0 (H) 06/02/2016   HDL 32.50 (L) 06/02/2016   LDLDIRECT 65.0 06/02/2016   LDLCALC 61 02/06/2015   ALT 15 06/02/2016   AST 16 06/02/2016   NA 134 (L) 06/02/2016   K 4.6 06/02/2016   CL 103 06/02/2016   CREATININE 1.33 06/02/2016   BUN 21 06/02/2016   CO2 25 06/02/2016   TSH 2.080 06/03/2013   INR 1.10 09/21/2015   HGBA1C 8.4 08/02/2016    Assessment & Plan:   Problem List Items Addressed This Visit      Endocrine   Type 2 diabetes mellitus with complications ([place])    Improving since steroid injection. Given hypoglycemia, insulin changed today from NPH to Toujeo 30 units daily (with instructions to titrate up). Follow up in 1 month.      Relevant Medications   Insulin Glargine (TOUJEO SOLOSTAR) 300 UNIT/ML SOPN  Meds ordered this encounter  Medications  . Insulin Glargine (TOUJEO SOLOSTAR) 300 UNIT/ML SOPN    Sig: Inject 30 Units into the skin daily. Increase by 1 unit daily until fastings are below 150.    Dispense:  3 pen    Refill:  1  . Insulin Pen Needle 32G X 4 MM MISC    Sig: Use as directed to inject insulin.    Dispense:  100 each    Refill:  11   Follow-up: 1 month  15 minutes were spent face-to-face with the patient during this encounter and over half of that time was spent on counseling regarding new insulin and how to correctly use the pen/administer the insulin.   [place]

## 2016-08-16 NOTE — Patient Instructions (Signed)
30 units daily.  Increase by 1 unit until fastings are below 150.  Follow up in 1 month.  Take care  Dr. Lacinda Axon

## 2016-08-16 NOTE — Assessment & Plan Note (Signed)
Improving since steroid injection. Given hypoglycemia, insulin changed today from NPH to Toujeo 30 units daily (with instructions to titrate up). Follow up in 1 month.

## 2016-08-17 ENCOUNTER — Ambulatory Visit (INDEPENDENT_AMBULATORY_CARE_PROVIDER_SITE_OTHER): Payer: Medicare Other | Admitting: Pulmonary Disease

## 2016-08-17 ENCOUNTER — Encounter: Payer: Self-pay | Admitting: Pulmonary Disease

## 2016-08-17 ENCOUNTER — Telehealth: Payer: Self-pay | Admitting: Pulmonary Disease

## 2016-08-17 VITALS — BP 142/70 | HR 74 | Ht 71.0 in | Wt 211.0 lb

## 2016-08-17 DIAGNOSIS — R0609 Other forms of dyspnea: Secondary | ICD-10-CM | POA: Diagnosis not present

## 2016-08-17 DIAGNOSIS — G4761 Periodic limb movement disorder: Secondary | ICD-10-CM | POA: Insufficient documentation

## 2016-08-17 DIAGNOSIS — R0600 Dyspnea, unspecified: Secondary | ICD-10-CM

## 2016-08-17 DIAGNOSIS — G4733 Obstructive sleep apnea (adult) (pediatric): Secondary | ICD-10-CM | POA: Insufficient documentation

## 2016-08-17 DIAGNOSIS — E669 Obesity, unspecified: Secondary | ICD-10-CM

## 2016-08-17 DIAGNOSIS — Z789 Other specified health status: Secondary | ICD-10-CM | POA: Insufficient documentation

## 2016-08-17 MED ORDER — ALBUTEROL SULFATE HFA 108 (90 BASE) MCG/ACT IN AERS: 2.0000 | INHALATION_SPRAY | RESPIRATORY_TRACT | 5 refills | Status: DC | PRN

## 2016-08-17 MED ORDER — REQUIP 0.25 MG PO TABS: ORAL_TABLET | ORAL | 10 refills | Status: DC

## 2016-08-17 NOTE — Telephone Encounter (Signed)
Pharmacist asks if the Requip has to be only brand name? Please advise if generic is ok.

## 2016-08-17 NOTE — Progress Notes (Signed)
PULMONARY OFFICE FOLLOW-UP NOTE  Requesting MD/Service: Rockey Situ Date of initial consultation: 05/06/2016 Reason for consultation: Unexplained dyspnea  PT PROFILE: 83 y.o. M never smoker with extensive history of coronary artery disease referred by Dr. Rockey Situ for evaluation of progressive exertional dyspnea over 5-6 years  Subjective:  This is a routine reevaluation for exertional dyspnea which continues to be moderate in severity. He noticed no improvement with the trial of Anoro inhaler. He continues to have DOE when ambulating, especially of any incline. He has no other respiratory symptoms. He denies cough and chest pain. He denies orthopnea and PND. He has no lower extremity edema or calf tenderness. There is little day-to-day variation in his symptoms. There is no seasonal component to his symptoms.  Objective: Vitals:   08/17/16 1157  BP: (!) 142/70  Pulse: 74  SpO2: 97%  Weight: 211 lb (95.7 kg)  Height: 5\' 11"  (1.803 m)     EXAM:  Gen: WDWN, NAD HEENT: NCAT, sclera white, oropharynx normal Neck: Supple without LAN, JVD Lungs: Percussion normal, breath sounds full, no adventitious sounds Cardiovascular: RRR, no murmurs noted Abdomen: Moderate centripetal obesity, soft, nontender, normal BS Ext: No C/C/E Neuro: CNs grossly intact, motor and sensory intact  DATA:   BMP Latest Ref Rng & Units 06/02/2016 03/11/2016 09/21/2015  Glucose 70 - 99 mg/dL 388(H) 247(H) 177(H)  BUN 6 - 23 mg/dL 21 20 30(H)  Creatinine 0.40 - 1.50 mg/dL 1.33 1.19 1.31(H)  BUN/Creat Ratio 10 - 22 - - -  Sodium 135 - 145 mEq/L 134(L) 138 137  Potassium 3.5 - 5.1 mEq/L 4.6 4.3 4.7  Chloride 96 - 112 mEq/L 103 111 107  CO2 19 - 32 mEq/L 25 22 24   Calcium 8.4 - 10.5 mg/dL 9.2 8.8(L) 8.9    CBC Latest Ref Rng & Units 03/11/2016 09/21/2015 03/09/2015  WBC 3.8 - 10.6 K/uL 5.8 7.5 6.3  Hemoglobin 13.0 - 18.0 g/dL 14.1 15.3 14.4  Hematocrit 40.0 - 52.0 % 39.8(L) 42.2 40.6  Platelets 150 - 440 K/uL 101(L)  114(L) 98(L)    PFTs: Lung volumes and diffusion capacity are normal. There is no definite obstruction but the FEV1 is slightly low and residual volume is slightly elevated  IMPRESSION:     ICD-10-CM   1. DOE out of proportion to PFT findings R06.09   2. Obesity (BMI 30-39.9) E66.9    Although the PFTs are not entirely normal, I do not think that the minimal abnormalities noted are sufficient to explain his degree of exertional dyspnea. He did not respond to a trial of bronchodilator therapy. He has significant cardiac history and is followed by Dr. Rockey Situ.  I do think that there is a significant component of deconditioning. Mild to moderate obesity is likely a contributor and might be the most reversible factor  PLAN:  We discussed weight loss strategies in detail. I encouraged continued efforts at increasing activity level. I will discuss with Dr. Rockey Situ. Perhaps a repeat echocardiogram might be warranted. Follow-up as needed   Merton Border, MD PCCM service Mobile [phone] Pager [phone] 08/17/2016 2:59 PM

## 2016-08-17 NOTE — Telephone Encounter (Signed)
Spoke with Misty at [facility] to let her know to D/C the Requip. DS states it was sent in on the wrong pt. Nothing further needed.

## 2016-08-17 NOTE — Patient Instructions (Signed)
We discussed weight loss strategies I will speak with Dr. Rockey Situ to see if there is any other evaluation recommended from his perspective  Follow-up as needed

## 2016-08-29 ENCOUNTER — Telehealth: Payer: Self-pay | Admitting: Family Medicine

## 2016-08-29 NOTE — Telephone Encounter (Signed)
Pt wife called about husband his sugar at 446 pt is out of town. Pt wants to know what should he do or should he take more insulin? Pt was transferred to team health. Thank you!

## 2016-08-29 NOTE — Telephone Encounter (Signed)
I spoke with patient. He will call in the am with his fasting sugar.

## 2016-08-29 NOTE — Telephone Encounter (Signed)
[facility]  Patient Name: KWAKU MOSTAFA  DOB: Feb 10, 1934    Initial Comment Caller states her husbands blood sugar is 446.    Nurse Assessment  Nurse: Arthor Captain, RN, Margaret Date/Time (Eastern Time): 08/29/2016 4:24:09 PM  Confirm and document reason for call. If symptomatic, describe symptoms. ---Caller states her husband has a BS of 446 taken about an hour ago. This AM BS was 201. Started Toujeo yesterday. Last night BS was 396. No other form of insulin  Does the patient have any new or worsening symptoms? ---Yes  Will a triage be completed? ---Yes  Related visit to physician within the last 2 weeks? ---Yes  Does the PT have any chronic conditions? (i.e. diabetes, asthma, etc.) ---Yes  List chronic conditions. ---diabetes, heart disease  Is this a behavioral health or substance abuse call? ---No     Guidelines    Guideline Title Affirmed Question Affirmed Notes  Diabetes - High Blood Sugar [1] Blood glucose > 300 mg/dl (16.5 mmol/l) AND [2] two or more times in a row    Final Disposition User   Call PCP Now Cockrum, RN, Joycelyn Schmid    Comments  i want to speak to the patient wife is going to call him and let him know im calling him directly.  had patient recheck BS and its 387 at present.   Referrals  REFERRED TO PCP OFFICE   Disagree/Comply: Comply

## 2016-08-29 NOTE — Telephone Encounter (Signed)
Spoke to patient.  For the past two days his BS have been very elevated since change of medication.  Last night patient did not feel very good so he took his BS, it was 396.  Patient then went walking and drinking water till 0130 to get BS down to 200's. Patient woke up this morning and decided to check BS, it was 201. About an hour ago patient stated he was not feeling all that well and decided to check BS, 446

## 2016-08-29 NOTE — Telephone Encounter (Signed)
Please call patient, thanks

## 2016-08-29 NOTE — Telephone Encounter (Signed)
Margaret from [facility] called and stated that pt started Insulin Glargine (TOUJEO SOLOSTAR) 300 UNIT/ML SOPN yesterday and his blood sugar was 449 about an hour ago. She had pt take a walk and drink water and retake his blood sugar. It is now 387. Please advise, thank you!

## 2016-08-30 ENCOUNTER — Telehealth: Payer: Self-pay

## 2016-08-30 ENCOUNTER — Other Ambulatory Visit: Payer: Self-pay

## 2016-08-30 NOTE — Telephone Encounter (Signed)
45 units daily (this am). Increase by 1 unit daily until fastings are below 150.

## 2016-08-30 NOTE — Telephone Encounter (Addendum)
Patient call blood sugar before lunch 288 after taking Insulin .  Patient broke out in sweat all over whole body was sweating and was nauseated and shaking he  and left work. Denied blurry vision.    Just ate lunch , blood sugar  now at 320 pm is 309.  Patient isn't  feeling  hot sweaty , symptoms seem to have diminished.

## 2016-08-30 NOTE — Telephone Encounter (Signed)
Patient advised of below and verbalized understanding.  Appointment scheduled with pharm D.

## 2016-08-30 NOTE — Telephone Encounter (Signed)
Pt was advised to call with his blood sugar numbers. AM 207 and he walked for 10 min 199. Thank you!  Call pt @ [phone].

## 2016-08-30 NOTE — Telephone Encounter (Signed)
Insulin as directed. 45 units daily. Increase by 1 unit daily. See Pharm D Monday. Schedule appt please.

## 2016-08-30 NOTE — Telephone Encounter (Signed)
Thayer Headings advised of below and verbalized understanding of below.  Per Thayer Headings patient has not taken Insulin this am, will start 45 units this am. Per Dr Lacinda Axon if FBS blood sugar 150 tomorrow can stay at 45 units to then follow regimen. They will continue to monitor blood sugar and increase insulin as directed.

## 2016-09-05 ENCOUNTER — Encounter: Payer: Self-pay | Admitting: Pharmacist

## 2016-09-05 ENCOUNTER — Ambulatory Visit (INDEPENDENT_AMBULATORY_CARE_PROVIDER_SITE_OTHER): Payer: Medicare Other | Admitting: Pharmacist

## 2016-09-05 DIAGNOSIS — I10 Essential (primary) hypertension: Secondary | ICD-10-CM | POA: Diagnosis not present

## 2016-09-05 DIAGNOSIS — E118 Type 2 diabetes mellitus with unspecified complications: Secondary | ICD-10-CM

## 2016-09-05 LAB — BASIC METABOLIC PANEL
BUN: 26 mg/dL — AB (ref 6–23)
CHLORIDE: 102 meq/L (ref 96–112)
CO2: 28 meq/L (ref 19–32)
CREATININE: 1.46 mg/dL (ref 0.40–1.50)
Calcium: 9.4 mg/dL (ref 8.4–10.5)
GFR: 49.27 mL/min — ABNORMAL LOW (ref >60.00)
GLUCOSE: 285 mg/dL — AB (ref 70–99)
Potassium: 4.8 mEq/L (ref 3.5–5.1)
Sodium: 136 mEq/L (ref 135–145)

## 2016-09-05 LAB — MAGNESIUM: Magnesium: 2.2 mg/dL (ref 1.5–2.5)

## 2016-09-05 MED ORDER — INSULIN GLARGINE 300 UNIT/ML ~~LOC~~ SOPN: 49.0000 [IU] | PEN_INJECTOR | Freq: Every day | SUBCUTANEOUS | 1 refills | Status: DC

## 2016-09-05 NOTE — Progress Notes (Signed)
    S:     Chief Complaint  Patient presents with  . Medication Management    Diabetes    Patient arrives in good spirits accompanied by his wife.  Presents for diabetes evaluation, education, and management at the request of Dr Lacinda Axon. Patient was referred on 08/30/16 (telephone note).  Patient was last seen by Primary Care Provider on 08/16/2016.   Today he reports improved CBGs since starting Toujeo with dose titration.  Patient also reports following Cortisone shot he had some cramps. He does reports cramps at night every night and a couple times per night in ankle.  It requires him to get out of bed.  Tried mustard, vinegar without relief.    Patient reports Diabetes was diagnosed in ~2002.   Family/Social History: Father (MI)  Patient reports adherence with medications.  Current diabetes medications include: Toujeo 49 units daily  Previously tried metformin but had diarrhea.   Current hypertension medications include: carvedilol 12.5 mg BID, furosemide 20 mg daily, ramipril 10 mg daily   Patient denies hypoglycemic events. He states he did have a value of 91 which he treated with glucose tablets and orange juice.  Reports hypoglycemia awareness with dizziness.    Patient reported dietary habits: Eats 3 meals/day Breakfast:1/2 piece cheese toast or peanut butter, cereal with fruit Lunch: bacon lettuce tomato sandwich (1/2 sandwich -wheat bread) Dinner:meat with vegetables (butter beans, pinto beans, green beans, corn, sweet potatoes) Snacks:peanut butter crackers Drinks:coffee, water, regular coke or sprite (can over several days)   Patient reported exercise habits: rides bike 45 minutes 4 days per week. He reports he is about to leave for vacation and wont be exercising   Patient reports nocturia 1x/night.  Patient denies neuropathy. Patient reports visual changes (cataract in left eye). Patient reports self foot exams. Denies changes.    O:  Physical Exam  Vitals  reviewed.  Review of Systems  Musculoskeletal:       Cramping   Lab Results  Component Value Date   HGBA1C 8.4 08/02/2016   Vitals:   09/05/16 1112  BP: 131/74  Pulse: 71   Home fasting CBG: 118, 91, 153, 162, 180, 160, 182, 207, 201, 259 mg/dL   A/P: Diabetes longstanding diagnosed currently uncontrolled with improved fasting CBGs since starting Toujeo. Patient denies hypoglycemic events over past couple weeks and is able to verbalize appropriate hypoglycemia management plan. Patient reports adherence with medication. Control is suboptimal due to non-optimized insulin regimen and diet. Following discussion and approval by Dr Lacinda Axon, the following medication changes were made:  -Continue Toujeo 49 units daily.  You may increase weekly if weekly average blood glucose is greater than 150.  -Discussed low carbohydrate diet and exercise -Discussed s/sx and tx of hypoglycemia  Hypertension longstanding diagnosed currently controled.  Patient reports adherence with medication. Continue current medications. -BMET/Mg obtained today due to patient report of cramping episodes.   Written patient instructions provided.  Total time in face to face counseling 45 minutes.   Follow up with Dr Lacinda Axon.    Patient was seen with Dr Lacinda Axon today in clinic and medication changes were discussed and approved prior to initiation

## 2016-09-05 NOTE — Patient Instructions (Signed)
Continue Toujeo 49 units daily.  You may increase weekly if weekly average blood glucose is greater than 150.   We will check your electrolytes and kidney function today

## 2016-09-06 NOTE — Progress Notes (Signed)
Care was provided under my supervision. I agree with the management as indicated in the note.  Ajah Vanhoose DO  

## 2016-09-06 NOTE — Assessment & Plan Note (Signed)
Diabetes longstanding diagnosed currently uncontrolled with improved fasting CBGs since starting Toujeo. Patient denies hypoglycemic events over past couple weeks and is able to verbalize appropriate hypoglycemia management plan. Patient reports adherence with medication. Control is suboptimal due to non-optimized insulin regimen and diet. Following discussion and approval by Dr Lacinda Axon, the following medication changes were made:  -Continue Toujeo 49 units daily.  You may increase weekly if weekly average blood glucose is greater than 150.  -Discussed low carbohydrate diet and exercise -Discussed s/sx and tx of hypoglycemia

## 2016-09-06 NOTE — Assessment & Plan Note (Signed)
Hypertension longstanding diagnosed currently controled.  Patient reports adherence with medication. Continue current medications. -BMET/Mg obtained today due to patient report of cramping episodes.

## 2016-09-15 ENCOUNTER — Ambulatory Visit (INDEPENDENT_AMBULATORY_CARE_PROVIDER_SITE_OTHER): Payer: Medicare Other | Admitting: Family Medicine

## 2016-09-15 ENCOUNTER — Encounter: Payer: Self-pay | Admitting: Family Medicine

## 2016-09-15 DIAGNOSIS — E118 Type 2 diabetes mellitus with unspecified complications: Secondary | ICD-10-CM | POA: Diagnosis not present

## 2016-09-15 NOTE — Assessment & Plan Note (Signed)
Improving. Advised to eat regularly to prevent hypoglycemia. It has additional hypoglycemic episodes will need to decrease insulin. We'll continue his current dosing of Toujeo.  Follow up at end of August for A1C.

## 2016-09-15 NOTE — Progress Notes (Signed)
Subjective:  Patient ID: [name] [name], male    DOB: [birth year]  Age: 83 y.o. MRN: [id number]  CC: Follow up   HPI:  83 year old male with an extensive past medical history including coronary artery disease status post CABG, diastolic CHF, hypertension, OSA, DM 2, CKD, hyperlipidemia presents for follow-up regarding his diabetes.  DM-2  Patient's blood sugars have markedly improved.  He has had one episode of documented hypoglycemia. Likely secondary to poor intake. He responded appropriately with a glucose tablet.  Patient had a few other episodes of diaphoresis and weakness. He did not check his blood sugar. No chest pain or shortness of breath. Likely hypoglycemia.  Currently on Toujeo 48 units daily.  Social Hx   Social History   Social History  . Marital status: Married    Spouse name: N/A  . Number of children: 4  . Years of education: N/A   Occupational History  . 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 Smoking status: Never Smoker  . Smokeless tobacco: Never Used  . Alcohol use Yes     Comment: occ cocktail  . Drug use: No  . Sexual activity: Not Asked   Other Topics Concern  . None   Social History Narrative   Goes by [name]   Has living will    Has designated [name] as health care POA   Would accept resuscitation attempts   Would not want tube feeds if cognitively unaware    Review of Systems  Constitutional: Positive for diaphoresis.  Endocrine:       Hypoglycemia.   Objective:  BP 140/60   Pulse 67   Temp 98.1 F (36.7 C) (Oral)   Resp 16   Ht 5\' 11"  (1.803 m)   Wt 211 lb 12.8 oz (96.1 kg)   SpO2 98%   BMI 29.54 kg/m   BP/Weight 09/15/2016 09/05/2016 7/67/3419  Systolic BP 379 024 097  Diastolic BP 60 74 70  Wt. (Lbs) 211.8 207.4 211  BMI 29.54 28.93 29.43   Physical Exam  Constitutional: He is oriented to person, place, and time. He appears well-developed. No distress.    Cardiovascular: Normal rate and regular rhythm.   2/6 systolic murmur.  Pulmonary/Chest: Effort normal and breath sounds normal.  Neurological: He is alert and oriented to person, place, and time.  Psychiatric: He has a normal mood and affect.  Vitals reviewed.   Lab Results  Component Value Date   WBC 5.8 03/11/2016   HGB 14.1 03/11/2016   HCT 39.8 (L) 03/11/2016   PLT 101 (L) 03/11/2016   GLUCOSE 285 (H) 09/05/2016   CHOL 121 06/02/2016   TRIG 260.0 (H) 06/02/2016   HDL 32.50 (L) 06/02/2016   LDLDIRECT 65.0 06/02/2016   LDLCALC 61 02/06/2015   ALT 15 06/02/2016   AST 16 06/02/2016   NA 136 09/05/2016   K 4.8 09/05/2016   CL 102 09/05/2016   CREATININE 1.46 09/05/2016   BUN 26 (H) 09/05/2016   CO2 28 09/05/2016   TSH 2.080 06/03/2013   INR 1.10 09/21/2015   HGBA1C 8.4 08/02/2016    Assessment & Plan:   Problem List Items Addressed This Visit      Endocrine   Type 2 diabetes mellitus with complications ([place])    Improving. Advised to eat regularly to prevent hypoglycemia. It has additional hypoglycemic episodes will need to decrease insulin. We'll continue his current dosing of Toujeo.  Follow up  at end of August for A1C.        Follow-up: August  Lilleigh Hechavarria Franklin

## 2016-09-15 NOTE — Patient Instructions (Addendum)
Continue your meds.  Follow up at the End of August.  Take care  Dr. Lacinda Axon

## 2016-09-26 ENCOUNTER — Other Ambulatory Visit: Payer: Self-pay | Admitting: Cardiovascular Disease

## 2016-10-05 ENCOUNTER — Telehealth: Payer: Self-pay

## 2016-10-05 ENCOUNTER — Other Ambulatory Visit: Payer: Self-pay

## 2016-10-05 NOTE — Telephone Encounter (Signed)
[facility] called states patient pharmacy transferred script to them they do not have available. They need to have script resent with maximum units per day or insurance will not cover.

## 2016-10-06 ENCOUNTER — Other Ambulatory Visit: Payer: Self-pay | Admitting: Family Medicine

## 2016-10-06 DIAGNOSIS — E118 Type 2 diabetes mellitus with unspecified complications: Secondary | ICD-10-CM

## 2016-10-06 MED ORDER — INSULIN GLARGINE 300 UNIT/ML ~~LOC~~ SOPN: 48.0000 [IU] | PEN_INJECTOR | Freq: Every day | SUBCUTANEOUS | 11 refills | Status: DC

## 2016-10-26 ENCOUNTER — Telehealth: Payer: Self-pay | Admitting: Family Medicine

## 2016-10-26 DIAGNOSIS — E118 Type 2 diabetes mellitus with unspecified complications: Secondary | ICD-10-CM

## 2016-10-27 NOTE — Telephone Encounter (Signed)
48 

## 2016-10-27 NOTE — Telephone Encounter (Signed)
[facility] Needs to know max daily unit? Thank you!

## 2016-10-31 NOTE — Telephone Encounter (Signed)
Called and spoke with Dawn at [facility] and clarified  48 units for [employer]

## 2016-11-04 ENCOUNTER — Ambulatory Visit: Payer: Medicare Other | Admitting: Family Medicine

## 2016-11-09 ENCOUNTER — Encounter: Payer: Self-pay | Admitting: Family Medicine

## 2016-11-09 ENCOUNTER — Ambulatory Visit (INDEPENDENT_AMBULATORY_CARE_PROVIDER_SITE_OTHER): Payer: Medicare Other | Admitting: Family Medicine

## 2016-11-09 DIAGNOSIS — E118 Type 2 diabetes mellitus with unspecified complications: Secondary | ICD-10-CM | POA: Diagnosis not present

## 2016-11-09 DIAGNOSIS — F5101 Primary insomnia: Secondary | ICD-10-CM

## 2016-11-09 DIAGNOSIS — Z23 Encounter for immunization: Secondary | ICD-10-CM

## 2016-11-09 LAB — POCT GLYCOSYLATED HEMOGLOBIN (HGB A1C): Hemoglobin A1C: 8.3

## 2016-11-09 MED ORDER — TRAZODONE HCL 50 MG PO TABS: 50.0000 mg | ORAL_TABLET | Freq: Every evening | ORAL | 3 refills | Status: DC | PRN

## 2016-11-09 NOTE — Progress Notes (Signed)
Subjective:  Patient ID: [name] [name], male    DOB: [birth year]  Age: 83 y.o. MRN: [id number]  CC: Follow up DM, insomnia  HPI:  83 year old male with an extensive PMH including CAD, HTN, HLD DM-2, CKD, OSA presents for follow up regarding DM-2.   DM-2  Blood sugars not at goal.   Currently on Toujeo 49 units daily.   No recent hypoglycemia.  Eating in the middle of the night (after experiencing insomnia). Likely running up morning sugars. He has not been increasing as directed.  Insomnia  Patient has recently been experiencing insomnia.  He states that he gets up in the middle of night, goes to the restroom and then cannot fall back asleep (has trouble staying asleep).  He has history of periodic limb movement disorder.  Patient states that he's taken some over-the-counter medications without improvement.  He would like to discuss this today.  Social Hx   Social History   Social History  . Marital status: Married    Spouse name: N/A  . Number of children: 4  . Years of education: N/A   Occupational History  . 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 Smoking status: Never Smoker  . Smokeless tobacco: Never Used  . Alcohol use Yes     Comment: occ cocktail  . Drug use: No  . Sexual activity: Not Asked   Other Topics Concern  . None   Social History Narrative   [name]   Has living will    Has designated [name] [name] as health care POA   Would accept resuscitation attempts   Would not want tube feeds if cognitively unaware    Review of Systems  Constitutional: Negative.   Psychiatric/Behavioral: Positive for sleep disturbance.   Objective:  BP 140/64 (BP Location: Left Arm, Patient Position: Sitting, Cuff Size: Normal)   Pulse 70   Temp 98.3 F (36.8 C) (Oral)   Wt 214 lb 6 oz (97.2 kg)   SpO2 95%   BMI 29.90 kg/m   BP/Weight 11/09/2016 1/75/1025 10/09/2776  Systolic BP 242 353 614    Diastolic BP 64 60 74  Wt. (Lbs) 214.38 211.8 207.4  BMI 29.9 29.54 28.93   Physical Exam  Constitutional: He is oriented to person, place, and time. He appears well-developed. No distress.  Cardiovascular: Normal rate and regular rhythm.   Pulmonary/Chest: Effort normal. He has no wheezes. He has no rales.  Neurological: He is alert and oriented to person, place, and time.  Psychiatric: He has a normal mood and affect.  Vitals reviewed.   Lab Results  Component Value Date   WBC 5.8 03/11/2016   HGB 14.1 03/11/2016   HCT 39.8 (L) 03/11/2016   PLT 101 (L) 03/11/2016   GLUCOSE 285 (H) 09/05/2016   CHOL 121 06/02/2016   TRIG 260.0 (H) 06/02/2016   HDL 32.50 (L) 06/02/2016   LDLDIRECT 65.0 06/02/2016   LDLCALC 61 02/06/2015   ALT 15 06/02/2016   AST 16 06/02/2016   NA 136 09/05/2016   K 4.8 09/05/2016   CL 102 09/05/2016   CREATININE 1.46 09/05/2016   BUN 26 (H) 09/05/2016   CO2 28 09/05/2016   TSH 2.080 06/03/2013   INR 1.10 09/21/2015   HGBA1C 8.3 11/09/2016    Assessment & Plan:   Problem List Items Addressed This Visit    Type 2 diabetes mellitus with complications ([place])    Uncontrolled.  A1C 8.3 today.  Advised to increased Toujeo 1 unit daily until fastings are below 150 (consistently). Consider addition of Jardiance or GLP1 at follow up.       Relevant Orders   POCT glycosylated hemoglobin (Hb A1C) (Completed)   Insomnia    New problem. Trial of Trazodone.       Other Visit Diagnoses    Encounter for immunization       Relevant Orders   Flu vaccine HIGH DOSE PF (Completed)     Meds ordered this encounter  Medications  . Vitamin D, Cholecalciferol, 1000 units TABS    Sig: Take by mouth daily.  . traZODone (DESYREL) 50 MG tablet    Sig: Take 1-2 tablets (50-100 mg total) by mouth at bedtime as needed for sleep.    Dispense:  90 tablet    Refill:  3    Follow-up: Return in about 3 months (around 02/08/2017) for follow up.  [place]

## 2016-11-09 NOTE — Patient Instructions (Addendum)
Increase the Toujeo by 1 unit daily until you fastings are consistently below 150 (preferably 120 or less).  Consider the addition of Jardiance or Trulicity/Ozempic.    Trazodone for sleep.  Follow up in 3 months.  Take care  Dr. Lacinda Axon

## 2016-11-10 DIAGNOSIS — G4700 Insomnia, unspecified: Secondary | ICD-10-CM | POA: Insufficient documentation

## 2016-11-10 NOTE — Assessment & Plan Note (Signed)
New problem. Trial of Trazodone.

## 2016-11-10 NOTE — Assessment & Plan Note (Signed)
Uncontrolled. A1C 8.3 today.  Advised to increased Toujeo 1 unit daily until fastings are below 150 (consistently). Consider addition of Jardiance or GLP1 at follow up.

## 2016-11-14 ENCOUNTER — Other Ambulatory Visit: Payer: Self-pay | Admitting: Cardiovascular Disease

## 2016-11-17 ENCOUNTER — Telehealth: Payer: Self-pay | Admitting: Cardiovascular Disease

## 2016-11-17 NOTE — Telephone Encounter (Signed)
[place] and is concerned about hx and sob potential at high altitude.     Patient wants to know if he should have an rx or get some oxygen ordered incase he gets there and needs it   Please call

## 2016-11-17 NOTE — Telephone Encounter (Signed)
Spoke with patient and reviewed with him that previous assessments did not indicate the need for possible oxygen. Reviewed with him that if he needs to have any oxygen it would be ordered by his pulmonary physician. We spoke at length about his trip, altitude changes, and saturations. He was very appreciative for the call and had no further questions or concerns at this time.

## 2016-11-19 DIAGNOSIS — B9689 Other specified bacterial agents as the cause of diseases classified elsewhere: Secondary | ICD-10-CM | POA: Diagnosis not present

## 2016-11-19 DIAGNOSIS — J019 Acute sinusitis, unspecified: Secondary | ICD-10-CM | POA: Diagnosis not present

## 2016-11-19 DIAGNOSIS — J209 Acute bronchitis, unspecified: Secondary | ICD-10-CM | POA: Diagnosis not present

## 2016-11-29 ENCOUNTER — Ambulatory Visit (INDEPENDENT_AMBULATORY_CARE_PROVIDER_SITE_OTHER): Payer: Medicare Other | Admitting: Family Medicine

## 2016-11-29 ENCOUNTER — Other Ambulatory Visit: Payer: Self-pay | Admitting: Family Medicine

## 2016-11-29 ENCOUNTER — Ambulatory Visit (INDEPENDENT_AMBULATORY_CARE_PROVIDER_SITE_OTHER): Payer: Medicare Other

## 2016-11-29 ENCOUNTER — Encounter: Payer: Self-pay | Admitting: Family Medicine

## 2016-11-29 VITALS — BP 108/60 | HR 70 | Temp 98.6°F | Resp 18 | Ht 74.0 in | Wt 213.0 lb

## 2016-11-29 DIAGNOSIS — M1990 Unspecified osteoarthritis, unspecified site: Secondary | ICD-10-CM | POA: Diagnosis not present

## 2016-11-29 DIAGNOSIS — J40 Bronchitis, not specified as acute or chronic: Secondary | ICD-10-CM

## 2016-11-29 DIAGNOSIS — R059 Cough, unspecified: Secondary | ICD-10-CM

## 2016-11-29 DIAGNOSIS — R05 Cough: Secondary | ICD-10-CM

## 2016-11-29 DIAGNOSIS — I499 Cardiac arrhythmia, unspecified: Secondary | ICD-10-CM | POA: Diagnosis not present

## 2016-11-29 MED ORDER — DOXYCYCLINE HYCLATE 100 MG PO TABS: 100.0000 mg | ORAL_TABLET | Freq: Two times a day (BID) | ORAL | 0 refills | Status: DC

## 2016-11-29 MED ORDER — TRAMADOL HCL 50 MG PO TABS: 50.0000 mg | ORAL_TABLET | Freq: Four times a day (QID) | ORAL | 0 refills | Status: DC | PRN

## 2016-11-29 NOTE — Assessment & Plan Note (Signed)
Noted on exam. Asymptomatic. EKG with sinus rhythm with rate variation. Potentially could be PACs. We'll forward to his cardiologist to review.

## 2016-11-29 NOTE — Assessment & Plan Note (Signed)
Patient with chronic arthritic pain. Tolerates tramadol well. NSAIDs are not a great option given his cardiac issues.

## 2016-11-29 NOTE — Progress Notes (Signed)
[name] Rumps, MD Phone: [phone]  [name] [name] is a 83 y.o. male who presents today for same-day visit.  Patient notes over the last 2 weeks he's had symptoms. Has had cough and chest congestion. Ribs have been sore from the amount that he is coughing. He was evaluated at the walk-in clinic 10 days ago and treated for sinusitis and bronchitis with Augmentin and prednisone. Still using albuterol. No fevers. No shortness of breath. He continues to cough and cough up clear mucus. He is slightly better though has not improved a significant amount or continued to improve. Patient reports he has traveling to [place] in [place] and will be at higher elevation. He wonders if he can have an as needed order for oxygen to pick up from the pharmacy.  Patient notes arthritic issues with scattered joint aches at times. He's been taking tramadol for some time now intermittently. Typically only takes it when he travels. He's had cortisone injections in his shoulders though these cause significant cramps. Tramadol does not make him drowsy.  Irregular heartbeat noted on exam at times. Other times it was regular rate and rhythm. He notes no palpitations. He's never had an issue with irregular heartbeat.  PMH: nonsmoker.   ROS see history of present illness  Objective  Physical Exam Vitals:   11/29/16 1438  BP: 108/60  Pulse: 70  Resp: 18  Temp: 98.6 F (37 C)  SpO2: 98%   Ambulatory O2 sat 95%.  BP Readings from Last 3 Encounters:  11/29/16 108/60  11/09/16 140/64  09/15/16 140/60   Wt Readings from Last 3 Encounters:  11/29/16 213 lb (96.6 kg)  11/09/16 214 lb 6 oz (97.2 kg)  09/15/16 211 lb 12.8 oz (96.1 kg)    Physical Exam  Constitutional: No distress.  Cardiovascular: Normal heart sounds.   Patient with periods of regular rate and rhythm followed by extra heartbeats followed by regular rate and rhythm, no murmurs rubs or gallops  Pulmonary/Chest: Effort normal. No  respiratory distress.  Occasional coarse breath sounds on the left initially though these resolved and his lung sounds were clear  Musculoskeletal: He exhibits no edema.  Neurological: He is alert. Gait normal.  Skin: Skin is warm and dry. He is not diaphoretic.   EKG: Sinus rhythm with rate variation versus PACs, rate 74, no ischemic changes  Assessment/Plan: Please see individual problem list.  Chronic arthritis Patient with chronic arthritic pain. Tolerates tramadol well. NSAIDs are not a great option given his cardiac issues.  Bronchitis Suspect bronchitis as cause of his symptoms. Has improved a little bit though not a terribly significant amount. Occasional coarse breath sounds on lung exam. We'll obtain a chest x-ray to evaluate for other underlying causes. Once this returns we'll determine what antibiotic to change him to. Given return precautions.  Irregular heart beat Noted on exam. Asymptomatic. EKG with sinus rhythm with rate variation. Potentially could be PACs. We'll forward to his cardiologist to review.   Orders Placed This Encounter  Procedures  . DG Chest 2 View    Standing Status:   Future    Standing Expiration Date:   01/29/2018    Order Specific Question:   Reason for Exam (SYMPTOM  OR DIAGNOSIS REQUIRED)    Answer:   continued productive cough after antibiotics and prednisone    Order Specific Question:   Preferred imaging location?    Answer:   [facility] Specific Question:   Radiology Contrast Protocol -  do NOT remove file path    Answer:   [email or web address]  . EKG 12-Lead     [name] Rumps, MD [place]

## 2016-11-29 NOTE — Patient Instructions (Signed)
Nice to see you. We'll check a chest x-ray and contact you with the results. I refilled your tramadol. If you develop shortness of breath, cough productive of blood, fevers, palpitations, or any new or changing symptoms please seek medical attention.

## 2016-11-29 NOTE — Assessment & Plan Note (Addendum)
Suspect bronchitis as cause of his symptoms. Has improved a little bit though not a terribly significant amount. Occasional coarse breath sounds on lung exam. We'll obtain a chest x-ray to evaluate for other underlying causes. Once this returns we'll determine what antibiotic to change him to. Given return precautions.

## 2016-11-30 ENCOUNTER — Other Ambulatory Visit: Payer: Self-pay

## 2016-11-30 MED ORDER — DOXYCYCLINE HYCLATE 100 MG PO TABS: 100.0000 mg | ORAL_TABLET | Freq: Two times a day (BID) | ORAL | 0 refills | Status: DC

## 2016-12-05 DIAGNOSIS — J189 Pneumonia, unspecified organism: Secondary | ICD-10-CM

## 2016-12-05 HISTORY — DX: Pneumonia, unspecified organism: J18.9

## 2016-12-15 DIAGNOSIS — L821 Other seborrheic keratosis: Secondary | ICD-10-CM | POA: Diagnosis not present

## 2016-12-15 DIAGNOSIS — D692 Other nonthrombocytopenic purpura: Secondary | ICD-10-CM | POA: Diagnosis not present

## 2016-12-15 DIAGNOSIS — D225 Melanocytic nevi of trunk: Secondary | ICD-10-CM | POA: Diagnosis not present

## 2016-12-15 DIAGNOSIS — Z85828 Personal history of other malignant neoplasm of skin: Secondary | ICD-10-CM | POA: Diagnosis not present

## 2016-12-15 DIAGNOSIS — L570 Actinic keratosis: Secondary | ICD-10-CM | POA: Diagnosis not present

## 2016-12-23 NOTE — Progress Notes (Signed)
In Cardiology Office Note  Date:  12/26/2016   ID:  [name] [name], [name] [name] [birth year], MRN [id number]  PCP:  Coral Spikes, DO   Chief Complaint  Patient presents with  . other    6 month f/u no complaints today. Meds reviewed verbally with pt.    HPI:  83 year old gentleman with a history of  coronary artery disease, bypass surgery in November 2006, Stress test March 2017, cath 09/2015, medical management EF 55% in 12/2013 diabetes, numbers running high obesity,  hyperlipidemia,  hypertension,  erectile dysfunction Chronic SOB who presents for routine followup of his coronary artery disease.  Reports he is getting over bronchitis from last month Feels back to his baseline but still feels tired, weak  PFT mild to moderate obstruction Chronic baseline, shortness of breath on exertion  Sleep good, better than on his last clinic visit No exercise Occasional biking, does not like to go walking, has shortness of breath, chronic issue  Chronic Joint pain, shoulder Leg cramps at night at times, worse with cortisone  Takes crestor 20 every other day, total 121 LDL 65  Chronic hand, back pain  Lab work reviewed with him in detail Total chol 120  Having Left stomach pain, nerve pain. 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 Sees neurology at [facility]   EKG personally reviewed by myself on todays visit sinus rhythm with rate 71 bpm no significant ST or T-wave changes  Other past medical history reviewed On his last clinic visit, he reported worsening shortness of breath over the past 6 months  Previous echocardiogram in 2015 which was essentially normal  he denies any leg edema  CT scan of his abdomen reviewed showing mild diffuse aortic atherosclerosis Review of lab work showing elevated glucose levels but does report hemoglobin A1c 6.9  Reports he saw specialist for his back in [place], had MRI showing arthritis no spinal stenosis He is  considering starting meloxicam Was previously on Celebrex. This is more expensive  Previously took a trip [place] with long car trip for 10 hours x2 days each way. He had significant lower extremity edema. He took additional doses of Lasix with improvement of his swelling. He reports having chronic swelling of the left lower extremity, swelling of the right lower extremity has resolved. Doppler of the lower extremity on the left showed no DVT  Periodically has subxiphoid pain that is sharp, unclear if it is associated with food. No longer taking a statin as this caused leg cramps He continues to bike 3-6 miles per day. Unable to walk long distances secondary to chronic knee and hip pain  Hemoglobin A1c 6.7.   participating in a study at [facility] where they manage his insulin . 6 Total cholesterol 112, LDL 61 with no cholesterol medication  catheterization for chest pain 06/05/2013 that showed patent grafts with LIMA to the LAD, vein graft to the diagonal, vein graft to the OM 3, vein graft to the distal RCA. Normal filling pressures and normal LV function.  Echocardiogram April 2009 shows normal systolic function with mild LVH, diastolic relaxation abnormality, mildly enlarged left atrium, mild aortic insufficiency. Cardiac CT scan in March 2009 showed severe coronary artery disease with patent grafts x4, saphenous vein graft to the PDA, OM, diagonal and a LIMA graft to the LAD.   PMH:   has a past medical history of Cataract; Coronary artery disease; Diastolic dysfunction; ED (erectile dysfunction); GERD (gastroesophageal reflux disease); Hyperlipidemia; Hypertension; IDDM (insulin dependent diabetes mellitus) (  [place]) (2000); Osteoarthritis; Perirectal fistula; and Tubular adenoma of colon (07/2012).  PSH:    Past Surgical History:  Procedure Laterality Date  . CARDIAC CATHETERIZATION  06/05/2013   [facility].   Marland Kitchen CARDIAC CATHETERIZATION N/A 09/24/2015   Procedure: Right Heart Cath and  Coronary/Graft Angiography;  Surgeon: Minna Merritts, MD;  Location: [facility];  Service: Cardiovascular;  Laterality: N/A;  . CATARACT EXTRACTION  sept 2013   right  . CHOLECYSTECTOMY  05/15/2011   Procedure: LAPAROSCOPIC CHOLECYSTECTOMY;  Surgeon: Rolm Bookbinder, MD;  Location: [facility];  Service: General;  Laterality: N/A;  . COLONOSCOPY W/ POLYPECTOMY    . CORONARY ARTERY BYPASS GRAFT  2007   x 4  . FOOT SURGERY  2012   right foot  . JOINT REPLACEMENT  8/10   Right THR--[name]  . LEFT AND RIGHT HEART CATHETERIZATION WITH CORONARY/GRAFT ANGIOGRAM N/A 06/05/2013   Procedure: LEFT AND RIGHT HEART CATHETERIZATION WITH Beatrix Fetters;  Surgeon: Peter M Martinique, MD;  Location: [facility] CATH LAB;  Service: Cardiovascular;  Laterality: N/A;  . LUMBAR LAMINECTOMY  1989  . Prostate photovaporization  5/16   Dr Budd Palmer    Current Outpatient Prescriptions  Medication Sig Dispense Refill  . acetaminophen (TYLENOL) 650 MG CR tablet Take 650 mg by mouth every 8 (eight) hours as needed for pain. For hand arthritis    . albuterol (PROVENTIL HFA;VENTOLIN HFA) 108 (90 Base) MCG/ACT inhaler Inhale 2 puffs into the lungs every 4 (four) hours as needed for wheezing or shortness of breath. 1 Inhaler 5  . aspirin 81 MG tablet Take 81 mg by mouth daily.     . BD INSULIN SYRINGE ULTRAFINE 31G X 15/64" 0.3 ML MISC USE AS DIRECTED 100 each 3  . carvedilol (COREG) 12.5 MG tablet Take 1 tablet (12.5 mg total) by mouth 2 (two) times daily. 180 tablet 4  . cyclobenzaprine (FLEXERIL) 10 MG tablet Take 0.5-1 tablets (5-10 mg total) by mouth at bedtime as needed for muscle spasms. 30 tablet 0  . finasteride (PROSCAR) 5 MG tablet Take 1 tablet (5 mg total) by mouth daily. 90 tablet 1  . furosemide (LASIX) 20 MG tablet Take 1 tablet (20 mg total) by mouth 2 (two) times daily as needed. 60 tablet 6  . Insulin Pen Needle 32G X 4 MM MISC Use as directed to inject insulin. 100 each 11  . Magnesium 250  MG TABS Take 250 mg by mouth daily.     Marland Kitchen omeprazole (PRILOSEC) 20 MG capsule TAKE 1 CAPSULE BY MOUTH TWO TIMES DAILY 60 capsule 11  . potassium chloride (K-DUR,KLOR-CON) 10 MEQ tablet Take 1 tablet (10 mEq total) by mouth daily. 90 tablet 4  . ramipril (ALTACE) 10 MG capsule Take 1 capsule (10 mg total) by mouth daily. 90 capsule 4  . rosuvastatin (CRESTOR) 20 MG tablet Take 1 tablet (20 mg total) by mouth daily. 90 tablet 4  . TOUJEO SOLOSTAR 300 UNIT/ML SOPN INJECT 30 UNITS INTO THE SKIN DAILY. INCREASE BY 1 UNIT DAILY UNTIL FASTINGS ARE BELOW 150 (Patient taking differently: INJECT 49 UNITS INTO THE SKIN DAILY. INCREASE BY 1 UNIT DAILY UNTIL FASTINGS ARE BELOW 150) 4.5 mL 1  . traMADol (ULTRAM) 50 MG tablet Take 1 tablet (50 mg total) by mouth every 6 (six) hours as needed. 90 tablet 0  . traZODone (DESYREL) 50 MG tablet Take 1-2 tablets (50-100 mg total) by mouth at bedtime as needed for sleep. 90 tablet 3  . Vitamin D,  Cholecalciferol, 1000 units TABS Take by mouth daily.     No current facility-administered medications for this visit.      Allergies:   Cortisone and Metformin and related   Social History:  The patient  reports that he has never smoked. He has never used smokeless tobacco. He reports that he drinks alcohol. He reports that he does not use drugs.   Family History:   family history includes Heart disease in his father; Lung cancer in his mother.    Review of Systems: Review of Systems  Constitutional: Negative.   Respiratory: Positive for shortness of breath.   Cardiovascular: Negative.   Gastrointestinal: Negative.   Musculoskeletal: Negative.   Neurological: Negative.        Left flank nerve pain  Psychiatric/Behavioral: Negative.   All other systems reviewed and are negative.    PHYSICAL EXAM: VS:  BP (!) 150/60 (BP Location: Left Arm, Patient Position: Sitting, Cuff Size: Large)   Pulse 71   Ht 6\' 2"  (1.88 m)   Wt 217 lb 8 oz (98.7 kg)   BMI 27.93  kg/m  , BMI Body mass index is 27.93 kg/m. GEN: Well nourished, well developed, in no acute distress, obese  HEENT: normal  Neck: no JVD, carotid bruits, or masses Cardiac: RRR; no murmurs, rubs, or gallops,no edema  Respiratory:  clear to auscultation bilaterally, normal work of breathing GI: soft, nontender, nondistended, + BS MS: no deformity or atrophy  Skin: warm and dry, no rash Neuro:  Strength and sensation are intact Psych: euthymic mood, full affect    Recent Labs: 03/11/2016: Hemoglobin 14.1; Platelets 101 06/02/2016: ALT 15 09/05/2016: BUN 26; Creatinine, Ser 1.46; Magnesium 2.2; Potassium 4.8; Sodium 136    Lipid Panel Lab Results  Component Value Date   CHOL 121 06/02/2016   HDL 32.50 (L) 06/02/2016   LDLCALC 61 02/06/2015   TRIG 260.0 (H) 06/02/2016      Wt Readings from Last 3 Encounters:  12/26/16 217 lb 8 oz (98.7 kg)  11/29/16 213 lb (96.6 kg)  11/09/16 214 lb 6 oz (97.2 kg)       ASSESSMENT AND PLAN:   Angina pectoris ([place]) - Plan: EKG 12-Lead Chronic stable shortness of breath likely from deconditioning Currently with no symptoms of angina. No further workup at this time. Continue current medication regimen.  Coronary artery disease involving native coronary artery of native heart with stable angina - Plan: EKG 12-Lead No further testing at this time, no ischemia on stress testing Stressed importance of weight loss and walking  Chronic diastolic CHF (congestive heart failure) (H[facility]) - Plan: EKG 12-Lead Continue Lasix daily with potassium Appears euvolemic Weight stable  SOB (shortness of breath) - Plan: EKG 12-Lead Chronic shortness of breath,  No ischemia on stress testing.   Long discussion concerning need to walk  Total encounter time more than 25 minutes  Greater than 50% was spent in counseling and coordination of care with the patient   Disposition:   F/U  12 months   Orders Placed This Encounter  Procedures  . EKG 12-Lead      Signed, Esmond Plants, M.D., Ph.D. 12/26/2016  [place], [place]

## 2016-12-26 ENCOUNTER — Encounter: Payer: Self-pay | Admitting: Cardiovascular Disease

## 2016-12-26 ENCOUNTER — Ambulatory Visit (INDEPENDENT_AMBULATORY_CARE_PROVIDER_SITE_OTHER): Payer: Medicare Other | Admitting: Cardiovascular Disease

## 2016-12-26 VITALS — BP 150/60 | HR 71 | Ht 74.0 in | Wt 217.5 lb

## 2016-12-26 DIAGNOSIS — N183 Chronic kidney disease, stage 3 unspecified: Secondary | ICD-10-CM

## 2016-12-26 DIAGNOSIS — E782 Mixed hyperlipidemia: Secondary | ICD-10-CM

## 2016-12-26 DIAGNOSIS — I25118 Atherosclerotic heart disease of native coronary artery with other forms of angina pectoris: Secondary | ICD-10-CM | POA: Diagnosis not present

## 2016-12-26 DIAGNOSIS — E118 Type 2 diabetes mellitus with unspecified complications: Secondary | ICD-10-CM

## 2016-12-26 DIAGNOSIS — I5032 Chronic diastolic (congestive) heart failure: Secondary | ICD-10-CM | POA: Diagnosis not present

## 2016-12-26 DIAGNOSIS — Z951 Presence of aortocoronary bypass graft: Secondary | ICD-10-CM

## 2016-12-26 DIAGNOSIS — I10 Essential (primary) hypertension: Secondary | ICD-10-CM

## 2016-12-26 MED ORDER — ROSUVASTATIN CALCIUM 20 MG PO TABS: 20.0000 mg | ORAL_TABLET | Freq: Every day | ORAL | 4 refills | Status: DC

## 2016-12-26 MED ORDER — FUROSEMIDE 20 MG PO TABS: 20.0000 mg | ORAL_TABLET | Freq: Two times a day (BID) | ORAL | 6 refills | Status: DC | PRN

## 2016-12-26 MED ORDER — CARVEDILOL 12.5 MG PO TABS: 12.5000 mg | ORAL_TABLET | Freq: Two times a day (BID) | ORAL | 4 refills | Status: DC

## 2016-12-26 MED ORDER — RAMIPRIL 10 MG PO CAPS: 10.0000 mg | ORAL_CAPSULE | Freq: Every day | ORAL | 4 refills | Status: DC

## 2016-12-26 MED ORDER — POTASSIUM CHLORIDE CRYS ER 10 MEQ PO TBCR: 10.0000 meq | EXTENDED_RELEASE_TABLET | Freq: Every day | ORAL | 4 refills | Status: DC

## 2016-12-26 NOTE — Patient Instructions (Signed)

## 2016-12-27 ENCOUNTER — Other Ambulatory Visit: Payer: Self-pay | Admitting: Family Medicine

## 2016-12-27 DIAGNOSIS — E118 Type 2 diabetes mellitus with unspecified complications: Secondary | ICD-10-CM

## 2016-12-28 ENCOUNTER — Telehealth: Payer: Self-pay | Admitting: Family Medicine

## 2016-12-28 DIAGNOSIS — E118 Type 2 diabetes mellitus with unspecified complications: Secondary | ICD-10-CM

## 2016-12-28 NOTE — Telephone Encounter (Signed)
Pt lvm requesting refill of TOUJEO SOLOSTAR 300 UNIT/ML SOPN to be sent to [facility]. Pt cb [phone]

## 2016-12-28 NOTE — Telephone Encounter (Signed)
Last OV 11/29/16 last filled 12/27/16

## 2017-01-09 DIAGNOSIS — N400 Enlarged prostate without lower urinary tract symptoms: Secondary | ICD-10-CM | POA: Diagnosis not present

## 2017-01-20 ENCOUNTER — Encounter: Payer: Self-pay | Admitting: Family Medicine

## 2017-01-20 DIAGNOSIS — H2512 Age-related nuclear cataract, left eye: Secondary | ICD-10-CM | POA: Diagnosis not present

## 2017-01-20 DIAGNOSIS — E113393 Type 2 diabetes mellitus with moderate nonproliferative diabetic retinopathy without macular edema, bilateral: Secondary | ICD-10-CM | POA: Diagnosis not present

## 2017-01-20 LAB — HM DIABETES EYE EXAM

## 2017-01-20 NOTE — Progress Notes (Signed)
o

## 2017-01-23 ENCOUNTER — Encounter: Payer: Self-pay | Admitting: Pharmacist

## 2017-01-23 ENCOUNTER — Telehealth: Payer: Self-pay

## 2017-01-23 ENCOUNTER — Ambulatory Visit (INDEPENDENT_AMBULATORY_CARE_PROVIDER_SITE_OTHER): Payer: Medicare Other | Admitting: Pharmacist

## 2017-01-23 DIAGNOSIS — E118 Type 2 diabetes mellitus with unspecified complications: Secondary | ICD-10-CM

## 2017-01-23 DIAGNOSIS — M1990 Unspecified osteoarthritis, unspecified site: Secondary | ICD-10-CM

## 2017-01-23 MED ORDER — INSULIN GLARGINE 300 UNIT/ML ~~LOC~~ SOPN: 50.0000 [IU] | PEN_INJECTOR | Freq: Every day | SUBCUTANEOUS | 6 refills | Status: DC

## 2017-01-23 NOTE — Patient Instructions (Addendum)
Thanks for coming to see me today,   1. Continue your Toujeo 51 units once a day, you may increase by 1 unit each day until your fasting blood sugars are between 80-130. Do not go over 60 units a day.   2. Come back to see me in about a month and bring in your blood sugars.   3. You may schedule tylenol (acetaminophen) 1000 mg three times daily for a few days and see if that doesn't help your shoulder pain  4. Ask your doctor about Voltaren gel. You would have to use this several times a day for several days to really see a difference.

## 2017-01-23 NOTE — Assessment & Plan Note (Signed)
#  Shoulder Pain - dx of arthritis, currently seeking evaluation with outside provider. LFTs wnl, no h/o hepatic dysfunction. -Recommended acetaminophen 1000 mg TID scheduled x several days -Advised pt to ask new provider about voltaren gel.

## 2017-01-23 NOTE — Telephone Encounter (Signed)
Was asked to ok sample give of toujeo.  Pt has been on this medication and tolerating (per CMA).  Ok to give one box until evaluated.

## 2017-01-23 NOTE — Assessment & Plan Note (Signed)
#  ASCVD risk - pt with clinical ASCVD as evidenced by CAD dx and CABG hx - Continued Aspirin 81 mg  - Continued rosuvastatin 20 mg every other day.    #Hypertension longstanding currently controlled .  Patient reports adherence with medication. - Continue current meds

## 2017-01-23 NOTE — Telephone Encounter (Signed)
Received fax from pharmacy stating they need documentation of maximum units per day for insurance approval. Prescription states take 30 units into skin daily and increase by one unit daily until fasting's are below 150. Last Ov was 11/29/16 but insulin was not discussed a sit was an acute visit. [name] [name] was a previous [name]. Last filled rx 12/27/16. I have scheduled [name] [name] to see [name] [name] to provide documentation of insulin for insurance and I have given [name] [name] a sample of toujeo 300 unit/ml. [name] [name] states he did not have enough to last over the weekend. Sample was given per verbal ok from [place].

## 2017-01-23 NOTE — Progress Notes (Signed)
S:     Chief Complaint  Patient presents with  . Medication Management    Diabetes    Patient arrives in [place], accompanied by long term girlfriend.  Presents for diabetes evaluation, education, and management at the request of Patient and Janett Billow, RN - not formally consulted by PCP - as patient had run out of insulin early due to discrepancy with days supply on prescription.   Patient reports Diabetes was diagnosed when he was ~83 years old. Is up to Toujeo 51 units daily. Inquires about difference between Toujeo and Lantus. Used to be on Lantus 27 units BID when he was being seen at [facility] for DM management. Reports he had a cortisone injection in shoulder several months ago and CBGs increased. Not interested in additional injections. Endorses significant pain from shoulder and has appointment with ortho provider today for evaluation. Brings CBG log of fasting readings over the last several months.   Insurance coverage/medication affordability: UHC Part D - affordable at this time  Patient reports adherence with medications.  Current diabetes medications include: Toujeo 51 untis daily  Current hypertension medications include: carvedilol 12.5 mg BID, lasix 20 mg daily, ramipril 10 mg daily  Patient denies hypoglycemic events.  Patient reported dietary habits: Eats 3 meals/day Breakfast: coffee, 1/2 grapefruit, strawberries and blueberrys with Splenda, dry cereal (Special K), 1/2 piece of cheese toast and 1/2 piece of wheat toast with PB+J Lunch: 1/2 hand and cheese or pimento cheese sandwich and apple and chips Dinner: steaks or hamburgers or pork chops with pinto beans or coleslaw Snacks: nabs ~1 pack/day, 2-3 small pieces of candy, ice cream Drinks: sweet tea with artificial sweetener, sprite, coffee  Patient reported exercise habits: bikes 2-3 times weekly while his GF walks, ~45 minutes  O:  Physical Exam  Constitutional: He appears well-developed and well-nourished.      Review of Systems  All other systems reviewed and are negative.    Lab Results  Component Value Date   HGBA1C 8.3 11/09/2016   Vitals:   01/23/17 1147  BP: 124/78  Pulse: 83   Home fasting CBG: 100s-190s in recent weeks, last week up to 220s. Today = 111 mg/dL   A/P: #Diabetes longstanding currently with labile control per CBG log. Patient denies hypoglycemic events and is able to verbalize appropriate hypoglycemia management plan. Patient reports adherence with medication. Control is suboptimal due to stress and pain level, dietary indiscretion. Following discussion and approval by Dr. Derrel Nip, the following medication changes were made:  - Renew Rx for Toujeo 50-60 units daily - may adjust by 1 unit until fasting BG are 80-130. Explained difference in Lantus and Toujeo. Patient verbalized understanding. - Next A1C anticipated 02/08/2017 or later - No formal consult at this time, will send Dr. [name] a message asking if formal consult desired.   #ASCVD risk - pt with clinical ASCVD as evidenced by CAD dx and CABG hx - Continued Aspirin 81 mg  - Continued rosuvastatin 20 mg every other day.    #Hypertension longstanding currently controlled .  Patient reports adherence with medication. - Continue current meds  #Shoulder Pain - dx of arthritis, currently seeking evaluation with outside provider. LFTs wnl, no h/o hepatic dysfunction. -Recommended acetaminophen 1000 mg TID scheduled x several days -Advised pt to ask new provider about voltaren gel.   Patient was seen with Dr. Derrel Nip today in clinic and medication changes were discussed and approved prior to initiation.Written patient instructions provided.  Total time in  face to face counseling 45 minutes.    Follow up in Pharmacist Clinic Visit 1 month pending formal consult.  Carlean Jews, Pharm.D. PGY2 Ambulatory Care Pharmacy Resident Phone: [phone]

## 2017-01-23 NOTE — Assessment & Plan Note (Signed)
#  Diabetes longstanding currently with labile control per CBG log. Patient denies hypoglycemic events and is able to verbalize appropriate hypoglycemia management plan. Patient reports adherence with medication. Control is suboptimal due to stress and pain level, dietary indiscretion. Following discussion and approval by Dr. Derrel Nip, the following medication changes were made:  - Renew Rx for Toujeo 50-60 units daily - may adjust by 1 unit until fasting BG are 80-130. Explained difference in Lantus and Toujeo. Patient verbalized understanding. - Next A1C anticipated 02/08/2017 or later - No formal consult at this time, will send Dr. [name] a message asking if formal consult desired.

## 2017-01-24 NOTE — Progress Notes (Signed)
  I have reviewed the above information and agree with above.   Teresa Tullo, MD 

## 2017-02-04 ENCOUNTER — Telehealth: Payer: Self-pay | Admitting: Family Medicine

## 2017-02-04 NOTE — Telephone Encounter (Signed)
-----   Message from Carlean Jews, [facility] sent at 01/23/2017  9:22 PM EST ----- Regarding: Referral needed if appropriate Hey,  This patient ran out of Toujeo early because Dr. Lacinda Axon sent the Rx for 30 units/day and told him to increase by 1 unit/day until CBGs controlled. Patient up to 51 units/day and burning through insulin supply "too fast" so insurance company wouldn't pay for refill early. Jessica scheduled him with me and I renewed Rx with Dr. Lupita Dawn permission but didn't charge him for visit as no formal consult in chart from you. Would you document consult to Rx clinic if appropriate?   Thanks!  Chrys Racer

## 2017-02-04 NOTE — Telephone Encounter (Signed)
Formal consult to pharmacy clinic given. Patient should follow-up with Chrys Racer for DM as documented in her note.

## 2017-02-20 ENCOUNTER — Ambulatory Visit: Payer: Medicare Other | Admitting: Pharmacist

## 2017-02-22 ENCOUNTER — Ambulatory Visit: Payer: Medicare Other | Admitting: Family Medicine

## 2017-03-23 DIAGNOSIS — H2512 Age-related nuclear cataract, left eye: Secondary | ICD-10-CM | POA: Diagnosis not present

## 2017-03-28 ENCOUNTER — Ambulatory Visit: Payer: Medicare Other | Admitting: Anesthesiology

## 2017-03-28 ENCOUNTER — Ambulatory Visit
Admission: RE | Admit: 2017-03-28 | Discharge: 2017-03-28 | Disposition: A | Discharge status: Home / Self Care | Payer: Medicare Other | Source: Ambulatory Visit | Attending: Ophthalmology | Admitting: Ophthalmology

## 2017-03-28 ENCOUNTER — Encounter: Payer: Self-pay | Admitting: *Deleted

## 2017-03-28 ENCOUNTER — Other Ambulatory Visit: Payer: Self-pay

## 2017-03-28 ENCOUNTER — Encounter
Admission: RE | Disposition: A | Discharge status: Home / Self Care | Payer: Self-pay | Source: Ambulatory Visit | Attending: Ophthalmology

## 2017-03-28 DIAGNOSIS — Z951 Presence of aortocoronary bypass graft: Secondary | ICD-10-CM | POA: Insufficient documentation

## 2017-03-28 DIAGNOSIS — Z7982 Long term (current) use of aspirin: Secondary | ICD-10-CM | POA: Diagnosis not present

## 2017-03-28 DIAGNOSIS — Z794 Long term (current) use of insulin: Secondary | ICD-10-CM | POA: Insufficient documentation

## 2017-03-28 DIAGNOSIS — M1990 Unspecified osteoarthritis, unspecified site: Secondary | ICD-10-CM | POA: Diagnosis not present

## 2017-03-28 DIAGNOSIS — Z96649 Presence of unspecified artificial hip joint: Secondary | ICD-10-CM | POA: Diagnosis not present

## 2017-03-28 DIAGNOSIS — K219 Gastro-esophageal reflux disease without esophagitis: Secondary | ICD-10-CM | POA: Insufficient documentation

## 2017-03-28 DIAGNOSIS — E1136 Type 2 diabetes mellitus with diabetic cataract: Secondary | ICD-10-CM | POA: Diagnosis not present

## 2017-03-28 DIAGNOSIS — H2512 Age-related nuclear cataract, left eye: Secondary | ICD-10-CM | POA: Insufficient documentation

## 2017-03-28 DIAGNOSIS — E119 Type 2 diabetes mellitus without complications: Secondary | ICD-10-CM | POA: Diagnosis not present

## 2017-03-28 DIAGNOSIS — I2510 Atherosclerotic heart disease of native coronary artery without angina pectoris: Secondary | ICD-10-CM | POA: Diagnosis not present

## 2017-03-28 DIAGNOSIS — I110 Hypertensive heart disease with heart failure: Secondary | ICD-10-CM | POA: Diagnosis not present

## 2017-03-28 DIAGNOSIS — Z79899 Other long term (current) drug therapy: Secondary | ICD-10-CM | POA: Diagnosis not present

## 2017-03-28 DIAGNOSIS — I509 Heart failure, unspecified: Secondary | ICD-10-CM | POA: Diagnosis not present

## 2017-03-28 HISTORY — DX: Dyspnea, unspecified: R06.00

## 2017-03-28 HISTORY — DX: Malignant (primary) neoplasm, unspecified: C80.1

## 2017-03-28 HISTORY — DX: Dizziness and giddiness: R42

## 2017-03-28 HISTORY — DX: Benign prostatic hyperplasia without lower urinary tract symptoms: N40.0

## 2017-03-28 HISTORY — PX: CATARACT EXTRACTION W/PHACO: SHX586

## 2017-03-28 HISTORY — DX: Pneumonia, unspecified organism: J18.9

## 2017-03-28 LAB — GLUCOSE, CAPILLARY: Glucose-Capillary: 198 mg/dL — ABNORMAL HIGH (ref 65–99)

## 2017-03-28 SURGERY — PHACOEMULSIFICATION, CATARACT, WITH IOL INSERTION
Anesthesia: Monitor Anesthesia Care | Site: Eye | Laterality: Left | Wound class: Clean

## 2017-03-28 MED ORDER — NA CHONDROIT SULF-NA HYALURON 40-17 MG/ML IO SOLN
INTRAOCULAR | Status: AC
  Filled 2017-03-28: qty 1

## 2017-03-28 MED ORDER — MOXIFLOXACIN HCL 0.5 % OP SOLN
OPHTHALMIC | Status: AC
  Filled 2017-03-28: qty 3

## 2017-03-28 MED ORDER — PROPOFOL 10 MG/ML IV BOLUS
INTRAVENOUS | Status: AC
  Filled 2017-03-28: qty 20

## 2017-03-28 MED ORDER — FENTANYL CITRATE (PF) 100 MCG/2ML IJ SOLN
INTRAMUSCULAR | Status: AC
  Filled 2017-03-28: qty 2

## 2017-03-28 MED ORDER — SODIUM CHLORIDE 0.9 % IV SOLN
INTRAVENOUS | Status: DC
  Administered 2017-03-28: 06:00:00 via INTRAVENOUS

## 2017-03-28 MED ORDER — ONDANSETRON HCL 4 MG/2ML IJ SOLN: 4.0000 mg | Freq: Once | INTRAMUSCULAR | Status: DC | PRN

## 2017-03-28 MED ORDER — MOXIFLOXACIN HCL 0.5 % OP SOLN
1.0000 [drp] | OPHTHALMIC | Status: AC | PRN
  Administered 2017-03-28: 1 [drp] via OPHTHALMIC

## 2017-03-28 MED ORDER — MOXIFLOXACIN HCL 0.5 % OP SOLN
OPHTHALMIC | Status: DC | PRN
  Administered 2017-03-28: 0.2 mL via OPHTHALMIC

## 2017-03-28 MED ORDER — GLYCOPYRROLATE 0.2 MG/ML IJ SOLN
INTRAMUSCULAR | Status: AC
  Filled 2017-03-28: qty 1

## 2017-03-28 MED ORDER — ARMC OPHTHALMIC DILATING DROPS
OPHTHALMIC | Status: AC
  Administered 2017-03-28: 1 via OPHTHALMIC
  Filled 2017-03-28: qty 0.4

## 2017-03-28 MED ORDER — CARBACHOL 0.01 % IO SOLN
INTRAOCULAR | Status: DC | PRN
  Administered 2017-03-28: 0.5 mL via INTRAOCULAR

## 2017-03-28 MED ORDER — POVIDONE-IODINE 5 % OP SOLN
OPHTHALMIC | Status: DC | PRN
  Administered 2017-03-28: 1 via OPHTHALMIC

## 2017-03-28 MED ORDER — MIDAZOLAM HCL 2 MG/2ML IJ SOLN
INTRAMUSCULAR | Status: DC | PRN
  Administered 2017-03-28: 1 mg via INTRAVENOUS

## 2017-03-28 MED ORDER — MIDAZOLAM HCL 2 MG/2ML IJ SOLN
INTRAMUSCULAR | Status: AC
  Filled 2017-03-28: qty 2

## 2017-03-28 MED ORDER — EPINEPHRINE PF 1 MG/ML IJ SOLN
INTRAMUSCULAR | Status: AC
  Filled 2017-03-28: qty 2

## 2017-03-28 MED ORDER — NA CHONDROIT SULF-NA HYALURON 40-17 MG/ML IO SOLN
INTRAOCULAR | Status: DC | PRN
  Administered 2017-03-28: 1 mL via INTRAOCULAR

## 2017-03-28 MED ORDER — FENTANYL CITRATE (PF) 100 MCG/2ML IJ SOLN
INTRAMUSCULAR | Status: DC | PRN
  Administered 2017-03-28 (×2): 25 ug via INTRAVENOUS

## 2017-03-28 MED ORDER — EPINEPHRINE PF 1 MG/ML IJ SOLN
INTRAOCULAR | Status: DC | PRN
  Administered 2017-03-28: 07:00:00 via OPHTHALMIC

## 2017-03-28 MED ORDER — ARMC OPHTHALMIC DILATING DROPS
1.0000 "application " | OPHTHALMIC | Status: AC | PRN
  Administered 2017-03-28 (×3): 1 via OPHTHALMIC

## 2017-03-28 MED ORDER — POVIDONE-IODINE 5 % OP SOLN
OPHTHALMIC | Status: AC
  Filled 2017-03-28: qty 30

## 2017-03-28 MED ORDER — LIDOCAINE HCL (PF) 4 % IJ SOLN
INTRAMUSCULAR | Status: AC
  Filled 2017-03-28: qty 5

## 2017-03-28 MED ORDER — LIDOCAINE HCL (PF) 4 % IJ SOLN
INTRAOCULAR | Status: DC | PRN
  Administered 2017-03-28: 4 mL via OPHTHALMIC

## 2017-03-28 SURGICAL SUPPLY — 16 items
GLOVE BIO SURGEON STRL SZ8 (GLOVE) ×2 IMPLANT
GLOVE BIOGEL M 6.5 STRL (GLOVE) ×2 IMPLANT
GLOVE SURG LX 8.0 MICRO (GLOVE) ×1
GLOVE SURG LX STRL 8.0 MICRO (GLOVE) ×1 IMPLANT
GOWN STRL REUS W/ TWL LRG LVL3 (GOWN DISPOSABLE) ×2 IMPLANT
GOWN STRL REUS W/TWL LRG LVL3 (GOWN DISPOSABLE) ×2
LABEL CATARACT MEDS ST (LABEL) ×2 IMPLANT
LENS IOL TECNIS ITEC 19.5 (Intraocular Lens) ×2 IMPLANT
PACK CATARACT (MISCELLANEOUS) ×2 IMPLANT
PACK CATARACT BRASINGTON LX (MISCELLANEOUS) ×2 IMPLANT
PACK EYE AFTER SURG (MISCELLANEOUS) ×2 IMPLANT
SOL BSS BAG (MISCELLANEOUS) ×2
SOLUTION BSS BAG (MISCELLANEOUS) ×1 IMPLANT
SYR 5ML LL (SYRINGE) ×2 IMPLANT
WATER STERILE IRR 250ML POUR (IV SOLUTION) ×2 IMPLANT
WIPE NON LINTING 3.25X3.25 (MISCELLANEOUS) ×2 IMPLANT

## 2017-03-28 NOTE — H&P (Signed)
All labs reviewed. Abnormal studies sent to patients PCP when indicated.  Previous H&P reviewed, patient examined, there are NO CHANGES.  [name] Porfilio1/22/20197:19 AM

## 2017-03-28 NOTE — Anesthesia Postprocedure Evaluation (Signed)
Anesthesia Post Note  Patient: [name] [name]  Procedure(s) Performed: CATARACT EXTRACTION PHACO AND INTRAOCULAR LENS PLACEMENT ([place]) (Left Eye)  Patient location during evaluation: PACU Anesthesia Type: MAC Level of consciousness: awake Pain management: pain level controlled Vital Signs Assessment: post-procedure vital signs reviewed and stable Respiratory status: spontaneous breathing Cardiovascular status: blood pressure returned to baseline Postop Assessment: no apparent nausea or vomiting Anesthetic complications: no     Last Vitals:  Vitals:   03/28/17 0749 03/28/17 0757  BP: (!) 180/66 (!) 193/88  Pulse: (!) 57 (!) 58  Resp: 12 14  Temp: 36.6 C   SpO2: 96% 97%    Last Pain:  Vitals:   03/28/17 0749  TempSrc: Oral                 [name]

## 2017-03-28 NOTE — Transfer of Care (Signed)
Immediate Anesthesia Transfer of Care Note  Patient: [name] [name]  Procedure(s) Performed: CATARACT EXTRACTION PHACO AND INTRAOCULAR LENS PLACEMENT ([place]) (Left Eye)  Patient Location: PACU  Anesthesia Type:MAC  Level of Consciousness: awake, alert  and oriented  Airway & Oxygen Therapy: spontaneous respirations  Post-op Assessment: Report given to RN and Post -op Vital signs reviewed and stable  Post vital signs: Reviewed and stable  Last Vitals:  Vitals:   03/28/17 0746 03/28/17 0749  BP: (!) 180/66 (!) 180/66  Pulse: (!) 56 (!) 57  Resp: 18 12  Temp: 36.6 C 36.6 C  SpO2: 97% 96%    Last Pain:  Vitals:   03/28/17 0746  TempSrc: Oral         Complications: No apparent anesthesia complications

## 2017-03-28 NOTE — Op Note (Signed)
PREOPERATIVE DIAGNOSIS:  Nuclear sclerotic cataract of the left eye.   POSTOPERATIVE DIAGNOSIS:  Nuclear sclerotic cataract of the left eye.   OPERATIVE PROCEDURE: Procedure(s): CATARACT EXTRACTION PHACO AND INTRAOCULAR LENS PLACEMENT ([facility])   SURGEON:  Birder Robson, MD.   ANESTHESIA:  Anesthesiologist: Gunnar Fusi, MD CRNA: [name], CRNA  1.      Managed anesthesia care. 2.     0.88ml of Shugarcaine was instilled following the paracentesis   COMPLICATIONS:  None.   TECHNIQUE:   Stop and chop   DESCRIPTION OF PROCEDURE:  The patient was examined and consented in the preoperative holding area where the aforementioned topical anesthesia was applied to the left eye and then brought back to the Operating Room where the left eye was prepped and draped in the usual sterile ophthalmic fashion and a lid speculum was placed. A paracentesis was created with the side port blade and the anterior chamber was filled with viscoelastic. A near clear corneal incision was performed with the steel keratome. A continuous curvilinear capsulorrhexis was performed with a cystotome followed by the capsulorrhexis forceps. Hydrodissection and hydrodelineation were carried out with BSS on a blunt cannula. The lens was removed in a stop and chop  technique and the remaining cortical material was removed with the irrigation-aspiration handpiece. The capsular bag was inflated with viscoelastic and the Technis [id number] lens was placed in the capsular bag without complication. The remaining viscoelastic was removed from the eye with the irrigation-aspiration handpiece. The wounds were hydrated. The anterior chamber was flushed with Miostat and the eye was inflated to physiologic pressure. 0.87ml Vigamox was placed in the anterior chamber. The wounds were found to be water tight. The eye was dressed with Vigamox. The patient was given protective glasses to wear throughout the day and a shield with which to sleep  tonight. The patient was also given drops with which to begin a drop regimen today and will follow-up with me in one day. Implant Name Type Inv. Item Serial No. Manufacturer Lot No. LRB No. Used  LENS IOL DIOP 19.5 - [id number] Intraocular Lens LENS IOL DIOP 19.5 [id number]  Left 1    Procedure(s) with comments: CATARACT EXTRACTION PHACO AND INTRAOCULAR LENS PLACEMENT ([facility]) (Left) - [place] 00:42.0 AP% 15.0 CDE 6.31 Fluid Pack lot # [id number]  Electronically signed: Jimi Schappert 03/28/2017 7:46 AM

## 2017-03-28 NOTE — Discharge Instructions (Signed)
FOLLOW DR. PORFILIO'S POSTOP EYE DROP INSTRUCTION SHEET AS REVIEWED.  Eye Surgery Discharge Instructions  Expect mild scratchy sensation or mild soreness. DO NOT RUB YOUR EYE!  The day of surgery:  Minimal physical activity, but bed rest is not required  No reading, computer work, or close hand work  No bending, lifting, or straining.  May watch TV  For 24 hours:  No driving, legal decisions, or alcoholic beverages  Safety precautions  Eat anything you prefer: It is better to start with liquids, then soup then solid foods.  _____ Eye patch should be worn until postoperative exam tomorrow.  ____ Solar shield eyeglasses should be worn for comfort in the sunlight/patch while sleeping  Resume all regular medications including aspirin or Coumadin if these were discontinued prior to surgery. You may shower, bathe, shave, or wash your hair. Tylenol may be taken for mild discomfort.  Call your doctor if you experience significant pain, nausea, or vomiting, fever > 101 or other signs of infection. [phone] or [phone] Specific instructions:  Follow-up Information    Birder Robson, MD Follow up.   Specialty:  Ophthalmology Why:  03/29/17 @ 10:40 am Contact information: [address] [place] [address] [phone]

## 2017-03-28 NOTE — Anesthesia Post-op Follow-up Note (Signed)
Anesthesia QCDR form completed.        

## 2017-03-28 NOTE — Anesthesia Preprocedure Evaluation (Signed)
Anesthesia Evaluation  Patient identified by MRN, date of birth, ID band Patient awake    Reviewed: Allergy & Precautions, NPO status , Patient's Chart, lab work & pertinent test results  History of Anesthesia Complications Negative for: history of anesthetic complications  Airway Mallampati: II       Dental   Pulmonary sleep apnea and Continuous Positive Airway Pressure Ventilation , neg COPD,           Cardiovascular hypertension, Pt. on medications and Pt. on home beta blockers + CAD, + Cardiac Stents, + CABG and +CHF  (-) dysrhythmias (-) Valvular Problems/Murmurs     Neuro/Psych neg Seizures    GI/Hepatic Neg liver ROS, GERD  Medicated,  Endo/Other  diabetes, Type 2, Oral Hypoglycemic Agents, Insulin Dependent  Renal/GU Renal InsufficiencyRenal disease     Musculoskeletal   Abdominal   Peds  Hematology   Anesthesia Other Findings   Reproductive/Obstetrics                             Anesthesia Physical Anesthesia Plan  ASA: III  Anesthesia Plan: MAC   Post-op Pain Management:    Induction:   PONV Risk Score and Plan:   Airway Management Planned: Nasal Cannula  Additional Equipment:   Intra-op Plan:   Post-operative Plan:   Informed Consent: I have reviewed the patients History and Physical, chart, labs and discussed the procedure including the risks, benefits and alternatives for the proposed anesthesia with the patient or authorized representative who has indicated his/her understanding and acceptance.     Plan Discussed with:   Anesthesia Plan Comments:         Anesthesia Quick Evaluation

## 2017-03-30 ENCOUNTER — Other Ambulatory Visit: Payer: Self-pay | Admitting: *Deleted

## 2017-03-30 ENCOUNTER — Telehealth: Payer: Self-pay | Admitting: Family Medicine

## 2017-03-30 DIAGNOSIS — E118 Type 2 diabetes mellitus with unspecified complications: Secondary | ICD-10-CM

## 2017-03-30 MED ORDER — INSULIN GLARGINE 300 UNIT/ML ~~LOC~~ SOPN: 50.0000 [IU] | PEN_INJECTOR | Freq: Every day | SUBCUTANEOUS | 6 refills | Status: DC

## 2017-03-30 NOTE — Telephone Encounter (Signed)
Refill per protocol- patient is current with appointments

## 2017-03-30 NOTE — Telephone Encounter (Signed)
Copied from [place] [id number]. Topic: Quick Communication - Rx Refill/Question >> Mar 30, 2017 11:47 AM Patrice Paradise wrote: Medication: Insulin Glargine (TOUJEO SOLOSTAR) 300 UNIT/ML SOPN    Has the patient contacted their pharmacy? Yes.     (Agent: If no, request that the patient contact the pharmacy for the refill.)   Preferred Pharmacy (with phone number or street name):  [place], [place] - [address] [place] [address] Phone: [phone] Fax: [phone]     Agent: Please be advised that RX refills may take up to 3 business days. We ask that you follow-up with your pharmacy.

## 2017-04-05 ENCOUNTER — Other Ambulatory Visit: Payer: Self-pay | Admitting: Cardiovascular Disease

## 2017-04-07 DIAGNOSIS — G510 Bell's palsy: Secondary | ICD-10-CM | POA: Diagnosis not present

## 2017-04-13 DIAGNOSIS — G510 Bell's palsy: Secondary | ICD-10-CM | POA: Diagnosis not present

## 2017-04-21 DIAGNOSIS — G510 Bell's palsy: Secondary | ICD-10-CM | POA: Diagnosis not present

## 2017-04-28 DIAGNOSIS — G510 Bell's palsy: Secondary | ICD-10-CM | POA: Diagnosis not present

## 2017-05-01 DIAGNOSIS — R6889 Other general symptoms and signs: Secondary | ICD-10-CM | POA: Diagnosis not present

## 2017-05-01 DIAGNOSIS — J209 Acute bronchitis, unspecified: Secondary | ICD-10-CM | POA: Diagnosis not present

## 2017-05-06 NOTE — Progress Notes (Signed)
In Cardiology Office Note  Date:  05/08/2017   ID:  [name], DOB 09-24-1933, MRN [id number]  PCP:  [name], MD   Chief Complaint  Patient presents with  . OTHER    Pt would like to speak of concerns following post eye surgery c/o sob. Meds reviewed verbally with pt.    HPI:  84 year old gentleman with a history of  coronary artery disease, bypass surgery in November 2006, Stress test March 2017, cath 09/2015, medical management Occluded proximal LAD  Critical/severe native RCA disease  Vein graft to the diagonal is occluded  Other vein grafts patent to the RCA/PDA, vein graft to the OM 3, LIMA graft  EF 55% in 12/2013 diabetes, numbers running high obesity,  hyperlipidemia,  hypertension,  erectile dysfunction Chronic SOB who presents for routine followup of his coronary artery disease.  Difficult month  he has developed Bell's palsy, Left side of face Happened 2 days after doing cataract surgery Completed prednisone and antivirals Eye continues to tear,   Then developed bronchitis Seen in the walk-in clinic Completed levaquin, cough syrup with codeine Feels little bit better but still with significant coughing Asking for more cough syrup,  Still with significant cough Unable to clear his chest in the mornings  Last clinic visit also had bronchitis, difficulty getting over it  Previous results discussed with him in detail PFT mild to moderate obstruction Chronic baseline, shortness of breath on exertion Is not exercising  Continues to have problems with Insomnia waking up for 3 hours middle of the night  Chronic Joint pain, shoulder Leg cramps at night at times, worse with cortisone  Takes crestor 20 every other day, total 121 LDL 65 Does not feel like he can take more  Chronic hand, back pain  Previously with left stomach pain, nerve pain. 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 Se[facility]y [facility]  EKG  personally reviewed by myself on todays visit sinus rhythm with rate 73 bpm no significant ST or T-wave changes  Other past medical history reviewed Previous echocardiogram in 2015 which was essentially normal  he denies any leg edema  CT scan of his abdomen reviewed showing mild diffuse aortic atherosclerosis Review of lab work showing elevated glucose levels but does report hemoglobin A1c 6.9  Reports he saw specialist for his back in [place], had MRI showing arthritis no spinal stenosis He is considering starting meloxicam Was previously on Celebrex. This is more expensive  Previously took a trip [place] with long car trip for 10 hours x2 days each way. He had significant lower extremity edema. He took additional doses of Lasix with improvement of his swelling. He reports having chronic swelling of the left lower extremity, swelling of the right lower extremity has resolved. Doppler of the lower extremity on the left showed no DVT  Periodically has subxiphoid pain that is sharp, unclear if it is associated with food. No longer taking a statin as this caused leg cramps He continues to bike 3-6 miles per day. Unable to walk long distances secondary to chronic knee and hip pain  Hemoglobin A1c 6.7.   participating in a study at [facility] where they manage his insulin . 6 Total cholesterol 112, LDL 61 with no cholesterol medication  catheterization for chest pain 06/05/2013 that showed patent grafts with LIMA to the LAD, vein graft to the diagonal, vein graft to the OM 3, vein graft to the distal RCA. Normal filling pressures and normal LV function.  Echocardiogram April 2009 shows normal systolic function with mild LVH, diastolic relaxation abnormality, mildly enlarged left atrium, mild aortic insufficiency. Cardiac CT scan in March 2009 showed severe coronary artery disease with patent grafts x4, saphenous vein graft to the PDA, OM, diagonal and a LIMA graft to the LAD.   PMH:    has a past medical history of BPH (benign prostatic hyperplasia), Cancer ([place]), Cataract, Coronary artery disease, Diastolic dysfunction, Dyspnea, ED (erectile dysfunction), GERD (gastroesophageal reflux disease), Hyperlipidemia, Hypertension, IDDM (insulin dependent diabetes mellitus) ([place]) (2000), Osteoarthritis, Perirectal fistula, Pneumonia (12/2016), Tubular adenoma of colon (07/2012), and Vertigo.  PSH:    Past Surgical History:  Procedure Laterality Date  . BACK SURGERY    . CARDIAC CATHETERIZATION  06/05/2013   [facility].   Marland Kitchen CARDIAC CATHETERIZATION N/A 09/24/2015   Procedure: Right Heart Cath and Coronary/Graft Angiography;  Surgeon: Minna Merritts, MD;  Location: [facility];  Service: Cardiovascular;  Laterality: N/A;  . CATARACT EXTRACTION  sept 2013   right  . CATARACT EXTRACTION W/PHACO Left 03/28/2017   Procedure: CATARACT EXTRACTION PHACO AND INTRAOCULAR LENS PLACEMENT (IOC);  Surgeon: Birder Robson, MD;  Location: [facility] ORS;  Service: Ophthalmology;  Laterality: Left;  [place] 00:42.0 AP% 15.0 CDE 6.31 Fluid Pack lot # [id number]  . CHOLECYSTECTOMY  05/15/2011   Procedure: LAPAROSCOPIC CHOLECYSTECTOMY;  Surgeon: Rolm Bookbinder, MD;  Location: [facility];  Service: General;  Laterality: N/A;  . COLONOSCOPY W/ POLYPECTOMY    . CORONARY ARTERY BYPASS GRAFT  2007   x 4  . FOOT SURGERY  2012   right foot  . JOINT REPLACEMENT  8/10   Right THR--[name]  . LEFT AND RIGHT HEART CATHETERIZATION WITH CORONARY/GRAFT ANGIOGRAM N/A 06/05/2013   Procedure: LEFT AND RIGHT HEART CATHETERIZATION WITH Beatrix Fetters;  Surgeon: Peter M Martinique, MD;  Location: [facility] CATH LAB;  Service: Cardiovascular;  Laterality: N/A;  . LUMBAR LAMINECTOMY  1989  . Prostate photovaporization  5/16   Dr Budd Palmer    Current Outpatient Medications  Medication Sig Dispense Refill  . acetaminophen (TYLENOL) 500 MG tablet Take 1,000 mg by mouth 2 (two) times daily.    Marland Kitchen aspirin 81 MG tablet Take  81 mg by mouth daily.     . BD INSULIN SYRINGE ULTRAFINE 31G X 15/64" 0.3 ML MISC USE AS DIRECTED 100 each 3  . carvedilol (COREG) 12.5 MG tablet Take 1 tablet (12.5 mg total) by mouth 2 (two) times daily. 180 tablet 4  . finasteride (PROSCAR) 5 MG tablet Take 1 tablet (5 mg total) by mouth daily. (Patient taking differently: Take 5 mg by mouth at bedtime. ) 90 tablet 1  . furosemide (LASIX) 20 MG tablet TAKE 1 TABLET BY MOUTH TWICE DAILY AS NEEDED 90 tablet 3  . Insulin Glargine (TOUJEO SOLOSTAR) 300 UNIT/ML SOPN Inject 50-60 Units into the skin daily. Increase by 1 unit/day until fasting blood sugars are between 80-130. Do not exceed 60 units a day. 4.5 mL 6  . Insulin Pen Needle 32G X 4 MM MISC Use as directed to inject insulin. 100 each 11  . omeprazole (PRILOSEC) 20 MG capsule TAKE 1 CAPSULE BY MOUTH TWO TIMES DAILY (Patient taking differently: Take 20 mg by mouth daily) 60 capsule 11  . potassium chloride (K-DUR) 10 MEQ tablet TAKE 1 TABLET DAILY 90 tablet 3  . ramipril (ALTACE) 10 MG capsule Take 1 capsule (10 mg total) by mouth daily. 90 capsule 4  . rosuvastatin (CRESTOR) 20 MG tablet Take 1  tablet (20 mg total) by mouth daily. (Patient taking differently: Take 20 mg every other day by mouth. ) 90 tablet 4  . traMADol (ULTRAM) 50 MG tablet Take 1 tablet (50 mg total) by mouth every 6 (six) hours as needed. 90 tablet 0  . traZODone (DESYREL) 50 MG tablet Take 1-2 tablets (50-100 mg total) by mouth at bedtime as needed for sleep. 90 tablet 3  . Vitamin D, Cholecalciferol, 1000 units TABS Take 1,000 Units by mouth daily.      No current facility-administered medications for this visit.      Allergies:   Cortisone and Metformin and related   Social History:  The patient  reports that  has never smoked. he has never used smokeless tobacco. He reports that he drinks alcohol. He reports that he does not use drugs.   Family History:   family history includes Heart disease in his father; Lung  cancer in his mother.    Review of Systems: Review of Systems  Constitutional: Negative.   Respiratory: Positive for shortness of breath.   Cardiovascular: Negative.   Gastrointestinal: Negative.   Musculoskeletal: Negative.   Neurological: Negative.        Left flank nerve pain Drooping weakness left side of face  Psychiatric/Behavioral: Negative.   All other systems reviewed and are negative.    PHYSICAL EXAM: VS:  BP 118/60 (BP Location: Left Arm, Patient Position: Sitting, Cuff Size: Normal)   Pulse 61   Ht 6\' 2"  (1.88 m)   Wt 214 lb 8 oz (97.3 kg)   BMI 27.54 kg/m  , BMI Body mass index is 27.54 kg/m. Constitutional:  oriented to person, place, and time. No distress.  HENT: Drooping left side of face, tearing left eye Head: Normocephalic and atraumatic.  Eyes:  no discharge. No scleral icterus.  Neck: Normal range of motion. Neck supple. No JVD present.  Cardiovascular: Normal rate, regular rhythm, normal heart sounds and intact distal pulses. Exam reveals no gallop and no friction rub. No edema No murmur heard. Pulmonary/Chest: Effort normal and breath sounds normal. No stridor. No respiratory distress.  no wheezes.  no rales.  no tenderness.  Abdominal: Soft.  no distension.  no tenderness.  Musculoskeletal: Normal range of motion.  no  tenderness or deformity.  Neurological:  normal muscle tone. Coordination normal. No atrophy Skin: Skin is warm and dry. No rash noted. not diaphoretic.  Psychiatric:  normal mood and affect. behavior is normal. Thought content normal.      Recent Labs: 06/02/2016: ALT 15 09/05/2016: BUN 26; Creatinine, Ser 1.46; Magnesium 2.2; Potassium 4.8; Sodium 136    Lipid Panel Lab Results  Component Value Date   CHOL 121 06/02/2016   HDL 32.50 (L) 06/02/2016   LDLCALC 61 02/06/2015   TRIG 260.0 (H) 06/02/2016      Wt Readings from Last 3 Encounters:  05/08/17 214 lb 8 oz (97.3 kg)  03/28/17 212 lb (96.2 kg)  01/23/17 217 lb  (98.4 kg)      ASSESSMENT AND PLAN:  Angina pectoris ([place]) - Plan: EKG 12-Lead Chronic stable shortness of breath likely from deconditioning Currently with no symptoms of angina. No further workup at this time. Continue current medication regimen.  Stable  Coronary artery disease involving native coronary artery of native heart with stable angina - Plan: EKG 12-Lead No further testing at this time, no ischemia on stress testing Stressed importance of weight loss and walking Poor diet, eating ice cream, no regular exercise  Chronic diastolic CHF (congestive heart failure) (HCC) - Plan: EKG 12-Lead Continue Lasix daily with potassium Appears euvolemic Weight stable,  No changes made  SOB (shortness of breath) - Plan: EKG 12-Lead Chronic shortness of breath,  No ischemia on stress testing no further testing at this time Recommended regular exercise program when able to tolerate  Bell's palsy Left side of face Completed prednisone and antivirals Long discussion concerning recovery which will likely be very slow Prior history of suspected shingles  Bronchitis Completed Levaquin We have renewed his cough syrup Renew albuterol inhaler  Long discussion concerning need to walk  Total encounter time more than 45 minutes  Greater than 50% was spent in counseling and coordination of care with the patient   Disposition:   F/U  12 months   Orders Placed This Encounter  Procedures  . EKG 12-Lead     Signed, Esmond Plants, M.D., Ph.D. 05/08/2017  [place], [place]

## 2017-05-08 ENCOUNTER — Ambulatory Visit: Payer: Medicare Other | Admitting: Cardiovascular Disease

## 2017-05-08 ENCOUNTER — Encounter: Payer: Self-pay | Admitting: Cardiovascular Disease

## 2017-05-08 VITALS — BP 118/60 | HR 61 | Ht 74.0 in | Wt 214.5 lb

## 2017-05-08 DIAGNOSIS — I25118 Atherosclerotic heart disease of native coronary artery with other forms of angina pectoris: Secondary | ICD-10-CM

## 2017-05-08 DIAGNOSIS — E118 Type 2 diabetes mellitus with unspecified complications: Secondary | ICD-10-CM

## 2017-05-08 DIAGNOSIS — I5032 Chronic diastolic (congestive) heart failure: Secondary | ICD-10-CM | POA: Diagnosis not present

## 2017-05-08 DIAGNOSIS — R0602 Shortness of breath: Secondary | ICD-10-CM

## 2017-05-08 DIAGNOSIS — Z951 Presence of aortocoronary bypass graft: Secondary | ICD-10-CM

## 2017-05-08 DIAGNOSIS — N183 Chronic kidney disease, stage 3 unspecified: Secondary | ICD-10-CM

## 2017-05-08 DIAGNOSIS — E782 Mixed hyperlipidemia: Secondary | ICD-10-CM | POA: Diagnosis not present

## 2017-05-08 DIAGNOSIS — I10 Essential (primary) hypertension: Secondary | ICD-10-CM

## 2017-05-08 MED ORDER — ALBUTEROL SULFATE HFA 108 (90 BASE) MCG/ACT IN AERS: 2.0000 | INHALATION_SPRAY | Freq: Four times a day (QID) | RESPIRATORY_TRACT | 0 refills | Status: DC | PRN

## 2017-05-08 MED ORDER — GUAIFENESIN-CODEINE 200-8 MG/5ML PO LIQD: 5.0000 mL | Freq: Two times a day (BID) | ORAL | 0 refills | Status: DC | PRN

## 2017-05-08 NOTE — Patient Instructions (Addendum)
Medication Instructions:  Your physician has recommended you make the following change in your medication:  1. Inhaler as needed for shortness of breath or wheezing. 2. Cough Syrup 2 times daily as needed.  Labwork:  No new labs needed  Testing/Procedures:  No further testing at this time   Follow-Up: It was a pleasure seeing you in the office today. Please call us if you have new issues that need to be addressed before your next appt.  [phone]  Your physician wants you to follow-up in: 12 months.  You will receive a reminder letter in the mail two months in advance. If you don't receive a letter, please call our office to schedule the follow-up appointment.  If you need a refill on your cardiac medications before your next appointment, please call your pharmacy.  For educational health videos Log in to : [email or web address] Or : [email or web address], password : [email or web address]

## 2017-05-12 DIAGNOSIS — H16212 Exposure keratoconjunctivitis, left eye: Secondary | ICD-10-CM | POA: Diagnosis not present

## 2017-06-07 DIAGNOSIS — R05 Cough: Secondary | ICD-10-CM | POA: Diagnosis not present

## 2017-06-07 DIAGNOSIS — J209 Acute bronchitis, unspecified: Secondary | ICD-10-CM | POA: Diagnosis not present

## 2017-06-07 DIAGNOSIS — J9801 Acute bronchospasm: Secondary | ICD-10-CM | POA: Diagnosis not present

## 2017-06-07 DIAGNOSIS — J019 Acute sinusitis, unspecified: Secondary | ICD-10-CM | POA: Diagnosis not present

## 2017-06-07 DIAGNOSIS — B9689 Other specified bacterial agents as the cause of diseases classified elsewhere: Secondary | ICD-10-CM | POA: Diagnosis not present

## 2017-06-12 DIAGNOSIS — G510 Bell's palsy: Secondary | ICD-10-CM | POA: Diagnosis not present

## 2017-06-13 DIAGNOSIS — G4733 Obstructive sleep apnea (adult) (pediatric): Secondary | ICD-10-CM | POA: Diagnosis not present

## 2017-06-13 DIAGNOSIS — G510 Bell's palsy: Secondary | ICD-10-CM | POA: Diagnosis not present

## 2017-06-13 DIAGNOSIS — M5418 Radiculopathy, sacral and sacrococcygeal region: Secondary | ICD-10-CM | POA: Diagnosis not present

## 2017-06-14 ENCOUNTER — Other Ambulatory Visit: Payer: Self-pay | Admitting: Neurology

## 2017-06-14 DIAGNOSIS — G510 Bell's palsy: Secondary | ICD-10-CM

## 2017-06-20 ENCOUNTER — Ambulatory Visit: Payer: Medicare Other

## 2017-06-29 DIAGNOSIS — H02135 Senile ectropion of left lower eyelid: Secondary | ICD-10-CM | POA: Diagnosis not present

## 2017-07-27 DIAGNOSIS — H02135 Senile ectropion of left lower eyelid: Secondary | ICD-10-CM | POA: Diagnosis not present

## 2017-08-17 DIAGNOSIS — D485 Neoplasm of uncertain behavior of skin: Secondary | ICD-10-CM | POA: Diagnosis not present

## 2017-08-18 ENCOUNTER — Telehealth: Payer: Self-pay | Admitting: Family Medicine

## 2017-08-18 ENCOUNTER — Ambulatory Visit (INDEPENDENT_AMBULATORY_CARE_PROVIDER_SITE_OTHER): Payer: Medicare Other | Admitting: Family Medicine

## 2017-08-18 ENCOUNTER — Encounter: Payer: Self-pay | Admitting: Family Medicine

## 2017-08-18 VITALS — BP 150/70 | HR 57 | Temp 97.5°F | Ht 74.0 in | Wt 216.8 lb

## 2017-08-18 DIAGNOSIS — E118 Type 2 diabetes mellitus with unspecified complications: Secondary | ICD-10-CM | POA: Diagnosis not present

## 2017-08-18 DIAGNOSIS — Z8619 Personal history of other infectious and parasitic diseases: Secondary | ICD-10-CM | POA: Insufficient documentation

## 2017-08-18 DIAGNOSIS — E782 Mixed hyperlipidemia: Secondary | ICD-10-CM | POA: Diagnosis not present

## 2017-08-18 DIAGNOSIS — G510 Bell's palsy: Secondary | ICD-10-CM | POA: Insufficient documentation

## 2017-08-18 DIAGNOSIS — I10 Essential (primary) hypertension: Secondary | ICD-10-CM

## 2017-08-18 LAB — COMPREHENSIVE METABOLIC PANEL
ALBUMIN: 3.9 g/dL (ref 3.5–5.2)
ALT: 13 U/L (ref 0–53)
AST: 15 U/L (ref 0–37)
Alkaline Phosphatase: 118 U/L — ABNORMAL HIGH (ref 39–117)
BILIRUBIN TOTAL: 1.1 mg/dL (ref 0.2–1.2)
BUN: 17 mg/dL (ref 6–23)
CHLORIDE: 106 meq/L (ref 96–112)
CO2: 27 mEq/L (ref 19–32)
CREATININE: 1.22 mg/dL (ref 0.40–1.50)
Calcium: 8.9 mg/dL (ref 8.4–10.5)
GFR: 60.47 mL/min (ref >60.00)
Glucose, Bld: 276 mg/dL — ABNORMAL HIGH (ref 70–99)
Potassium: 4.4 mEq/L (ref 3.5–5.1)
SODIUM: 138 meq/L (ref 135–145)
Total Protein: 6.1 g/dL (ref 6.0–8.3)

## 2017-08-18 LAB — LIPID PANEL
CHOLESTEROL: 99 mg/dL (ref 0–200)
HDL: 31 mg/dL — ABNORMAL LOW (ref >39.00)
LDL Cholesterol: 41 mg/dL (ref 0–99)
NONHDL: 68.08
Total CHOL/HDL Ratio: 3
Triglycerides: 137 mg/dL (ref 0.0–149.0)
VLDL: 27.4 mg/dL (ref 0.0–40.0)

## 2017-08-18 LAB — HEMOGLOBIN A1C: HEMOGLOBIN A1C: 7.7 % — AB (ref 4.6–6.5)

## 2017-08-18 MED ORDER — BLOOD GLUCOSE MONITOR KIT: PACK | 0 refills | Status: DC

## 2017-08-18 NOTE — Assessment & Plan Note (Signed)
Symptoms and exam most consistent with Bell's palsy.  He will continue to see ophthalmology.  He will monitor.  He has deferred MRI imaging at this time.

## 2017-08-18 NOTE — Telephone Encounter (Signed)
-----  Message from Carlean Jews, [facility] sent at 08/18/2017  4:19 PM EDT ----- Marykay Lex, Looks like he has [place] for part B coverage. They prefer the OneTouch Verio system. I've pasted the ordering supplies below:  OneTouch Verio w/ Device Kit OneTouch Verio VI STRP OneTouch Delica Lancet Dev misc OneTouch Delica Lancets 16O or Fine  ----- Message ----- From: [name], MD Sent: 08/18/2017  12:13 PM To: Carlean Jews, [place],   This patient currently has a GE glucometer and wants to know if there is a better monitor that has cheap strips. He currently gets an error reading multiple times per week with this glucometer. Is there one that would possibly be covered by his insurance? Thanks.  Randall Hiss

## 2017-08-18 NOTE — Telephone Encounter (Signed)
Prescription printed.  Please fax to the pharmacy.

## 2017-08-18 NOTE — Progress Notes (Signed)
  [name], MD Phone: [phone]  [name] [name] is a 84 y.o. male who presents today for f/u.  CC: dm, htn, hld, bells palsy  HYPERTENSION Disease Monitoring: Blood pressure range-does not check chest pain- no      Dyspnea-chronic and stable, no changes, he follows with cardiology for this Medications: Compliance- taking ramipril, coreg, lasix   Edema- chronic left ankle, unchanged  DIABETES Disease Monitoring: Blood Sugar ranges-121-206 Polyuria/phagia/dipsia- no      Optho- UTD Medications: Compliance- taking toujeo 43-46 units Hypoglycemic symptoms- rare, feels "funky", takes glucose tablet and OJ and improves  HYPERLIPIDEMIA Disease Monitoring: See symptoms for Hypertension Medications: Compliance- taking crestor Right upper quadrant pain- no  Muscle aches- no  Patient notes that he had Bell's palsy on the left following cataract surgery.  He saw neurology.  It appears they recommended an MRI though this was not done.  Patient felt like he did not need this given he had no other symptoms.  He saw his ophthalmologist as well and they did a surgery that did help with his lower eyelid and he can now close his eyelid.  He notes with all the stress from this he had fever blisters 2-3 times.  Social History   Tobacco Use  Smoking Status Never Smoker  Smokeless Tobacco Never Used     ROS see history of present illness  Objective  Physical Exam Vitals:   08/18/17 1136  BP: (!) 150/70  Pulse: (!) 57  Temp: (!) 97.5 F (36.4 C)  SpO2: 97%    BP Readings from Last 3 Encounters:  08/18/17 (!) 150/70  05/08/17 118/60  03/28/17 (!) 193/88   Wt Readings from Last 3 Encounters:  08/18/17 216 lb 12.8 oz (98.3 kg)  05/08/17 214 lb 8 oz (97.3 kg)  03/28/17 212 lb (96.2 kg)    Physical Exam  Constitutional: No distress.  Cardiovascular: Normal rate, regular rhythm and normal heart sounds.  Pulmonary/Chest: Effort normal and breath sounds normal.    Musculoskeletal: He exhibits no edema.  Neurological: He is alert.  Left 7th nerve palsy, otherwise CN 2-12 intact, 5/5 strength in bilateral biceps, triceps, grip, quads, hamstrings, plantar and dorsiflexion, sensation to light touch intact in bilateral UE and LE, normal gait  Skin: Skin is warm and dry. He is not diaphoretic.     Assessment/Plan: Please see individual problem list.  Essential hypertension Slightly elevated though had been well controlled.  We will have him return in 1 to 2 weeks for repeat blood pressure check with nursing.  Lab work as outlined below.  Type 2 diabetes mellitus with complications (HCC) Check A1c.  Continue insulin.  Hyperlipidemia Check lipid panel.  Continue Crestor.  Bell's palsy Symptoms and exam most consistent with Bell's palsy.  He will continue to see ophthalmology.  He will monitor.  He has deferred MRI imaging at this time.  H/O cold sores Discussed that stress can cause these to flare.  He will monitor for recurrence.   Orders Placed This Encounter  Procedures  . Comp Met (CMET)  . HgB A1c  . Lipid panel    No orders of the defined types were placed in this encounter.    [name], MD [place]

## 2017-08-18 NOTE — Assessment & Plan Note (Signed)
Check lipid panel.  Continue Crestor.

## 2017-08-18 NOTE — Assessment & Plan Note (Addendum)
Slightly elevated though had been well controlled.  We will have him return in 1 to 2 weeks for repeat blood pressure check with nursing.  Lab work as outlined below.

## 2017-08-18 NOTE — Assessment & Plan Note (Signed)
Discussed that stress can cause these to flare.  He will monitor for recurrence.

## 2017-08-18 NOTE — Assessment & Plan Note (Signed)
Check A1c.  Continue insulin.

## 2017-08-18 NOTE — Patient Instructions (Signed)
Nice to see you. We will check lab work today and contact you with the results. We will contact you when we hear back from her pharmacist regarding the best post meter and strips for you. We will also check and see if there is a recommended trainer for you.

## 2017-08-21 ENCOUNTER — Other Ambulatory Visit: Payer: Medicare Other

## 2017-08-21 NOTE — Telephone Encounter (Signed)
faxed

## 2017-08-22 ENCOUNTER — Other Ambulatory Visit (INDEPENDENT_AMBULATORY_CARE_PROVIDER_SITE_OTHER): Payer: Medicare Other

## 2017-08-22 ENCOUNTER — Other Ambulatory Visit: Payer: Self-pay | Admitting: Family Medicine

## 2017-08-22 DIAGNOSIS — R748 Abnormal levels of other serum enzymes: Secondary | ICD-10-CM

## 2017-08-22 LAB — HEPATIC FUNCTION PANEL
ALBUMIN: 4 g/dL (ref 3.5–5.2)
ALT: 14 U/L (ref 0–53)
AST: 16 U/L (ref 0–37)
Alkaline Phosphatase: 119 U/L — ABNORMAL HIGH (ref 39–117)
Bilirubin, Direct: 0.2 mg/dL (ref 0.0–0.3)
TOTAL PROTEIN: 6.1 g/dL (ref 6.0–8.3)
Total Bilirubin: 1 mg/dL (ref 0.2–1.2)

## 2017-08-22 LAB — GAMMA GT: GGT: 52 U/L — AB (ref 7–51)

## 2017-08-22 MED ORDER — EMPAGLIFLOZIN 10 MG PO TABS: 10.0000 mg | ORAL_TABLET | Freq: Every day | ORAL | 2 refills | Status: DC

## 2017-08-23 ENCOUNTER — Other Ambulatory Visit: Payer: Self-pay | Admitting: Family Medicine

## 2017-08-23 DIAGNOSIS — R748 Abnormal levels of other serum enzymes: Secondary | ICD-10-CM

## 2017-08-30 ENCOUNTER — Encounter: Payer: Self-pay | Admitting: Gastroenterology

## 2017-08-30 ENCOUNTER — Ambulatory Visit: Payer: Medicare Other | Admitting: Gastroenterology

## 2017-08-30 VITALS — BP 153/73 | HR 65 | Ht 74.0 in | Wt 216.2 lb

## 2017-08-30 DIAGNOSIS — R748 Abnormal levels of other serum enzymes: Secondary | ICD-10-CM | POA: Diagnosis not present

## 2017-08-30 NOTE — Progress Notes (Signed)
[name] [name] [address]  [place], [place] [address]  Main: [phone]  Fax: [phone]   Gastroenterology Consultation  Referring Provider:     [name], MD Primary Care Physician:  [name], MD Primary Gastroenterologist:  Dr. Vonda [name] Reason for Consultation:     Elevated liver enzymes        HPI:   Chief complaint: Elevated liver enzymes  GEHRIG [name] is a 84 y.o. y/o male referred for consultation & management  by Dr. [name], [name], MD.  Patient was found to have an elevated alk phos.   Previous records show chronically elevated alk phos.  Currently alk phos at 119 and June 2019 labs.  Was 155 in March 2018, and 131 in December 2016.  Bilirubin, transaminases normal.Abdominal ultrasound in March 2014 reported mild to moderate hepatosplenomegaly.  CT abdomen in January 2017 reported normal liver.  Patient reports about 4 drinks of alcohol a week, which include a combination of wine and bourbon.  Denies any abdominal pain, nausea or vomiting, weight loss, epistaxis, hematuria, melena or, or hematochezia.  No abdominal distention or lower extremity swelling.  Takes Tylenol twice a day, for diffuse arthritis.  No other hepatotoxic drugs.  Screening colonoscopy done in May 2014, with tubular adenoma removed at the time, repeat recommended in 5 years.   Past Medical History:  Diagnosis Date  . BPH (benign prostatic hyperplasia)   . Cancer ([place])    skin  . Cataract   . Coronary artery disease    a. 4v CABG 01/2005; b. cath 06/05/13 showed patent grafts: LIMA-LAD, VG-D, VG-OM3, VG-dRCA, nl filling pressures, nl LV fxn  . Diastolic dysfunction    a. echo 03/7913: nl systolic fxn, mild LVH, diastolic relaxation abnormality, mildly enlarged LA, mild Ao insufficiency  . Dyspnea    with exertion  . ED (erectile dysfunction)   . GERD (gastroesophageal reflux disease)   . Hyperlipidemia   . Hypertension   . IDDM  (insulin dependent diabetes mellitus) ([place]) 2000  . Osteoarthritis   . Perirectal fistula   . Pneumonia 12/2016  . Tubular adenoma of colon 07/2012  . Vertigo     Past Surgical History:  Procedure Laterality Date  . BACK SURGERY    . CARDIAC CATHETERIZATION  06/05/2013   [facility].   Marland Kitchen CARDIAC CATHETERIZATION N/A 09/24/2015   Procedure: Right Heart Cath and Coronary/Graft Angiography;  Surgeon: Minna Merritts, MD;  Location: [facility];  Service: Cardiovascular;  Laterality: N/A;  . CATARACT EXTRACTION  sept 2013   right  . CATARACT EXTRACTION W/PHACO Left 03/28/2017   Procedure: CATARACT EXTRACTION PHACO AND INTRAOCULAR LENS PLACEMENT (IOC);  Surgeon: Birder Robson, MD;  Location: [facility];  Service: Ophthalmology;  Laterality: Left;  [place] 00:42.0 AP% 15.0 CDE 6.31 Fluid Pack lot # [id number]  . CHOLECYSTECTOMY  05/15/2011   Procedure: LAPAROSCOPIC CHOLECYSTECTOMY;  Surgeon: Rolm Bookbinder, MD;  Location: [facility];  Service: General;  Laterality: N/A;  . COLONOSCOPY W/ POLYPECTOMY    . CORONARY ARTERY BYPASS GRAFT  2007   x 4  . FOOT SURGERY  2012   right foot  . JOINT REPLACEMENT  8/10   Right THR--[name]  . LEFT AND RIGHT HEART CATHETERIZATION WITH CORONARY/GRAFT ANGIOGRAM N/A 06/05/2013   Procedure: LEFT AND RIGHT HEART CATHETERIZATION WITH Beatrix Fetters;  Surgeon: Peter M Martinique, MD;  Location: [facility];  Service: Cardiovascular;  Laterality: N/A;  . LUMBAR LAMINECTOMY  1989  . Prostate photovaporization  5/16   Dr Budd Palmer    Prior to Admission medications   Medication Sig Start Date End Date Taking? Authorizing Provider  acetaminophen (TYLENOL) 500 MG tablet Take 1,000 mg by mouth 2 (two) times daily.    [provider]  aspirin 81 MG tablet Take 81 mg by mouth daily.     [provider]  BD INSULIN SYRINGE ULTRAFINE 31G X 15/64" 0.3 ML MISC USE AS DIRECTED 07/31/15   Viviana Simpler I, MD  blood glucose meter kit and  supplies KIT OneTouch Verio w/ Device Kit OneTouch Verio VI STRP OneTouch Delica Lancet Dev misc OneTouch Delica Lancets 03K or Fine  Please check CBGs fasting and 2 other times during the day  Dx Code E11.8 08/18/17   [name], MD  carvedilol (COREG) 12.5 MG tablet Take 1 tablet (12.5 mg total) by mouth 2 (two) times daily. 12/26/16   Minna Merritts, MD  empagliflozin (JARDIANCE) 10 MG TABS tablet Take 10 mg by mouth daily. 08/22/17   [name], MD  finasteride (PROSCAR) 5 MG tablet Take 1 tablet (5 mg total) by mouth daily. Patient taking differently: Take 5 mg by mouth at bedtime.  04/16/12   Venia Carbon, MD  furosemide (LASIX) 20 MG tablet TAKE 1 TABLET BY MOUTH TWICE DAILY AS NEEDED 04/05/17   Minna Merritts, MD  Insulin Glargine (TOUJEO SOLOSTAR) 300 UNIT/ML SOPN Inject 50-60 Units into the skin daily. Increase by 1 unit/day until fasting blood sugars are between 80-130. Do not exceed 60 units a day. 03/30/17   Crecencio Mc, MD  Insulin Pen Needle 32G X 4 MM MISC Use as directed to inject insulin. 08/16/16   Coral Spikes, DO  omeprazole (PRILOSEC) 20 MG capsule TAKE 1 CAPSULE BY MOUTH TWO TIMES DAILY Patient taking differently: Take 20 mg by mouth daily 03/10/16   Venia Carbon, MD  potassium chloride (K-DUR) 10 MEQ tablet TAKE 1 TABLET DAILY 04/05/17   Minna Merritts, MD  ramipril (ALTACE) 10 MG capsule Take 1 capsule (10 mg total) by mouth daily. 12/26/16   Minna Merritts, MD  rosuvastatin (CRESTOR) 20 MG tablet Take 1 tablet (20 mg total) by mouth daily. Patient taking differently: Take 20 mg every other day by mouth.  12/26/16   Minna Merritts, MD  traZODone (DESYREL) 50 MG tablet Take 1-2 tablets (50-100 mg total) by mouth at bedtime as needed for sleep. 11/09/16   Coral Spikes, DO  Vitamin D, Cholecalciferol, 1000 units TABS Take 1,000 Units by mouth daily.     [provider]    Family History  Problem Relation Age of Onset  .  Lung cancer Mother   . Heart disease Father   . Colon cancer Neg Hx      Social History   Tobacco Use  . Smoking status: Never Smoker  . Smokeless tobacco: Never Used  Substance Use Topics  . Alcohol use: Yes    Comment: occ cocktail  . Drug use: No    Allergies as of 08/30/2017 - Review Complete 08/18/2017  Allergen Reaction Noted  . Cortisone Other (See Comments) 06/24/2016  . Metformin and related Diarrhea 09/05/2016    Review of Systems:    All systems reviewed and negative except where noted in HPI.   Physical Exam:  There were no vitals taken for this visit. No LMP for male patient. Psych:  Alert and cooperative. Normal mood  and affect. General:   Alert,  Well-developed, well-nourished, pleasant and cooperative in NAD Head:  Normocephalic and atraumatic. Eyes:  Sclera clear, no icterus.   Conjunctiva pink. Ears:  Normal auditory acuity. Nose:  No deformity, discharge, or lesions. Mouth:  No deformity or lesions,oropharynx pink & moist. Neck:  Supple; no masses or thyromegaly. Lungs:  Respirations even and unlabored.  Clear throughout to auscultation.   No wheezes, crackles, or rhonchi. No acute distress. Heart:  Regular rate and rhythm; no murmurs, clicks, rubs, or gallops. Abdomen:  Normal bowel sounds.  No bruits.  Soft, non-tender and non-distended without masses, hepatosplenomegaly or hernias noted.  No guarding or rebound tenderness.    Msk:  Symmetrical without gross deformities. Good, equal movement & strength bilaterally. Pulses:  Normal pulses noted. Extremities:  No clubbing or edema.  No cyanosis. Neurologic:  Alert and oriented x3;  grossly normal neurologically. Skin:  Intact without significant lesions or rashes. No jaundice. Lymph Nodes:  No significant cervical adenopathy. Psych:  Alert and cooperative. Normal mood and affect.   Labs: CBC    Component Value Date/Time   WBC 5.8 03/11/2016 1252   RBC 4.36 (L) 03/11/2016 1252   HGB 14.1  03/11/2016 1252   HGB 14.1 02/22/2013 1654   HCT 39.8 (L) 03/11/2016 1252   HCT 40.6 02/22/2013 1654   PLT 101 (L) 03/11/2016 1252   PLT 125 (L) 02/22/2013 1654   MCV 91.4 03/11/2016 1252   MCV 92 02/22/2013 1654   MCH 32.4 03/11/2016 1252   MCHC 35.4 03/11/2016 1252   RDW 13.6 03/11/2016 1252   RDW 13.3 02/22/2013 1654   LYMPHSABS 1.2 03/11/2016 1252   MONOABS 0.5 03/11/2016 1252   EOSABS 0.2 03/11/2016 1252   BASOSABS 0.0 03/11/2016 1252   CMP     Component Value Date/Time   NA 138 08/18/2017 1205   NA 140 12/04/2013 1503   NA 139 02/22/2013 1654   K 4.4 08/18/2017 1205   K 4.5 02/22/2013 1654   CL 106 08/18/2017 1205   CL 109 (H) 02/22/2013 1654   CO2 27 08/18/2017 1205   CO2 24 02/22/2013 1654   GLUCOSE 276 (H) 08/18/2017 1205   GLUCOSE 197 (H) 02/22/2013 1654   BUN 17 08/18/2017 1205   BUN 19 12/04/2013 1503   BUN 20 (H) 02/22/2013 1654   CREATININE 1.22 08/18/2017 1205   CREATININE 1.18 02/22/2013 1654   CREATININE 1.13 05/25/2012 1630   CALCIUM 8.9 08/18/2017 1205   CALCIUM 9.1 02/22/2013 1654   PROT 6.1 08/22/2017 1402   PROT 5.9 (L) 07/04/2014 0801   PROT 6.2 (L) 02/22/2013 1654   ALBUMIN 4.0 08/22/2017 1402   ALBUMIN 3.7 07/04/2014 0801   ALBUMIN 3.5 02/22/2013 1654   AST 16 08/22/2017 1402   AST 20 02/22/2013 1654   ALT 14 08/22/2017 1402   ALT 42 02/22/2013 1654   ALKPHOS 119 (H) 08/22/2017 1402   ALKPHOS 113 02/22/2013 1654   BILITOT 1.0 08/22/2017 1402   BILITOT 0.7 07/04/2014 0801   BILITOT 0.6 02/22/2013 1654   GFRNONAA 56 (L) 03/11/2016 1252   GFRNONAA 59 (L) 02/22/2013 1654   GFRAA >60 03/11/2016 1252   GFRAA >60 02/22/2013 1654    Imaging Studies: No results found.  Assessment and Plan:   ALAKAI MACBRIDE is a 84 y.o. y/o male has been referred for chronically elevated alk phos, with other liver enzymes normal  Chronically elevated alk phos likely due to fatty liver We will complete liver  work-up with viral hepatitis, and  autoimmune hepatitis testing We will also obtain right upper quadrant abdominal ultrasound to evaluate the liver  Patient educated on avoiding hepatotoxic drugs, including alcohol His platelets are also chronically low, which may mean that he has underlying cirrhosis, will await ultrasound of the liver to evaluate  He has arthritis pain throughout, and at his age, it is important to avoid NSAIDs His liver function appears to be normal based on his bilirubin, albumin.  Will also obtain INR to evaluate. Therefore, the elevated alk phos is not due to Tylenol and use, and Tylenol use is unlikely to cause further injury to his liver Therefore, benefits of Tylenol use to relieve his pain, outweigh the risks at this time. discontinuing Tylenol which is helping his pain, can mean that patient needs to be another medication, which will likely have stronger side effects, such as peptic ulcer disease from NSAIDs, constipation or altered mental status from narcotics etc.  His last colonoscopy was in 2014, and tubular adenoma polyps were removed His surveillance colonoscopy is now due, but patient is undergoing a lot this year as per him and his wife.  He had Bell's palsy, and is seeing neurology for it. We will await liver work-up, and discuss surveillance colonoscopy in future visits  If his liver work-up, shows cirrhosis, he will also need screening for varices  Dr [name] [name]

## 2017-08-30 NOTE — Patient Instructions (Signed)
F/U 3 months  Fatty Liver Fatty liver, also called hepatic steatosis or steatohepatitis, is a condition in which too much fat has built up in your liver cells. The liver removes harmful substances from your bloodstream. It produces fluids your body needs. It also helps your body use and store energy from the food you eat. In many cases, fatty liver does not cause symptoms or problems. It is often diagnosed when tests are being done for other reasons. However, over time, fatty liver can cause inflammation that may lead to more serious liver problems, such as scarring of the liver (cirrhosis). What are the causes? Causes of fatty liver may include:  Drinking too much alcohol.  Poor nutrition.  Obesity.  Cushing syndrome.  Diabetes.  Hyperlipidemia.  Pregnancy.  Certain drugs.  Poisons.  Some viral infections.  What increases the risk? You may be more likely to develop fatty liver if you:  Abuse alcohol.  Are pregnant.  Are overweight.  Have diabetes.  Have hepatitis.  Have a high triglyceride level.  What are the signs or symptoms? Fatty liver often does not cause any symptoms. In cases where symptoms develop, they can include:  Fatigue.  Weakness.  Weight loss.  Confusion.  Abdominal pain.  Yellowing of your skin and the white parts of your eyes (jaundice).  Nausea and vomiting.  How is this diagnosed? Fatty liver may be diagnosed by:  Physical exam and medical history.  Blood tests.  Imaging tests, such as an ultrasound, CT scan, or MRI.  Liver biopsy. A small sample of liver tissue is removed using a needle. The sample is then looked at under a microscope.  How is this treated? Fatty liver is often caused by other health conditions. Treatment for fatty liver may involve medicines and lifestyle changes to manage conditions such as:  Alcoholism.  High cholesterol.  Diabetes.  Being overweight or obese.  Follow these instructions at  home:  Eat a healthy diet as directed by your health care provider.  Exercise regularly. This can help you lose weight and control your cholesterol and diabetes. Talk to your health care provider about an exercise plan and which activities are best for you.  Do not drink alcohol.  Take medicines only as directed by your health care provider. Contact a health care provider if: You have difficulty controlling your:  Blood sugar.  Cholesterol.  Alcohol consumption.  Get help right away if:  You have abdominal pain.  You have jaundice.  You have nausea and vomiting. This information is not intended to replace advice given to you by your health care provider. Make sure you discuss any questions you have with your health care provider. Document Released: 04/08/2005 Document Revised: 07/30/2015 Document Reviewed: 07/03/2013 Elsevier Interactive Patient Education  [employer].

## 2017-08-31 DIAGNOSIS — R748 Abnormal levels of other serum enzymes: Secondary | ICD-10-CM | POA: Diagnosis not present

## 2017-09-02 LAB — COMPREHENSIVE METABOLIC PANEL WITH GFR
ALT: 14 IU/L (ref 0–44)
AST: 18 IU/L (ref 0–40)
Albumin/Globulin Ratio: 1.8 (ref 1.2–2.2)
Albumin: 4 g/dL (ref 3.5–4.7)
Alkaline Phosphatase: 120 IU/L — ABNORMAL HIGH (ref 39–117)
BUN/Creatinine Ratio: 14 (ref 10–24)
BUN: 19 mg/dL (ref 8–27)
Bilirubin Total: 1.1 mg/dL (ref 0.0–1.2)
CO2: 23 mmol/L (ref 20–29)
Calcium: 9.1 mg/dL (ref 8.6–10.2)
Chloride: 106 mmol/L (ref 96–106)
Creatinine, Ser: 1.35 mg/dL — ABNORMAL HIGH (ref 0.76–1.27)
GFR calc Af Amer: 57 mL/min/1.73 — ABNORMAL LOW
GFR calc non Af Amer: 49 mL/min/1.73 — ABNORMAL LOW
Globulin, Total: 2.2 g/dL (ref 1.5–4.5)
Glucose: 261 mg/dL — ABNORMAL HIGH (ref 65–99)
Potassium: 4.9 mmol/L (ref 3.5–5.2)
Sodium: 141 mmol/L (ref 134–144)
Total Protein: 6.2 g/dL (ref 6.0–8.5)

## 2017-09-02 LAB — CBC
HEMOGLOBIN: 14.4 g/dL (ref 13.0–17.7)
Hematocrit: 40.1 % (ref 37.5–51.0)
MCH: 33.5 pg — ABNORMAL HIGH (ref 26.6–33.0)
MCHC: 35.9 g/dL — AB (ref 31.5–35.7)
MCV: 93 fL (ref 79–97)
PLATELETS: 105 10*3/uL — AB (ref 150–450)
RBC: 4.3 x10E6/uL (ref 4.14–5.80)
RDW: 13.1 % (ref 12.3–15.4)
WBC: 5.9 10*3/uL (ref 3.4–10.8)

## 2017-09-02 LAB — HEPATITIS B CORE ANTIBODY, TOTAL: HEP B C TOTAL AB: NEGATIVE

## 2017-09-02 LAB — HEPATITIS A ANTIBODY, TOTAL: Hep A Total Ab: NEGATIVE

## 2017-09-02 LAB — HCV COMMENT:

## 2017-09-02 LAB — CERULOPLASMIN: CERULOPLASMIN: 20.1 mg/dL (ref 16.0–31.0)

## 2017-09-02 LAB — FERRITIN: FERRITIN: 135 ng/mL (ref 30–400)

## 2017-09-02 LAB — HEPATITIS B SURFACE ANTIBODY,QUALITATIVE: Hep B Surface Ab, Qual: NONREACTIVE

## 2017-09-02 LAB — HEPATITIS C ANTIBODY (REFLEX): HCV Ab: <0.1 s/co ratio (ref 0.0–0.9)

## 2017-09-02 LAB — MITOCHONDRIAL/SMOOTH MUSCLE AB PNL
Mitochondrial Ab: <20 Units (ref 0.0–20.0)
SMOOTH MUSCLE AB: 5 U (ref 0–19)

## 2017-09-02 LAB — HEPATITIS B SURFACE ANTIGEN: Hepatitis B Surface Ag: NEGATIVE

## 2017-09-05 ENCOUNTER — Other Ambulatory Visit: Payer: Self-pay | Admitting: Family Medicine

## 2017-09-05 ENCOUNTER — Ambulatory Visit: Payer: Medicare Other

## 2017-09-06 ENCOUNTER — Ambulatory Visit (INDEPENDENT_AMBULATORY_CARE_PROVIDER_SITE_OTHER): Payer: Medicare Other

## 2017-09-06 ENCOUNTER — Ambulatory Visit
Admission: RE | Admit: 2017-09-06 | Discharge: 2017-09-06 | Disposition: A | Discharge status: Home / Self Care | Payer: Medicare Other | Source: Ambulatory Visit | Attending: Gastroenterology | Admitting: Gastroenterology

## 2017-09-06 DIAGNOSIS — R748 Abnormal levels of other serum enzymes: Secondary | ICD-10-CM

## 2017-09-06 DIAGNOSIS — I10 Essential (primary) hypertension: Secondary | ICD-10-CM | POA: Diagnosis not present

## 2017-09-06 DIAGNOSIS — Z9049 Acquired absence of other specified parts of digestive tract: Secondary | ICD-10-CM | POA: Insufficient documentation

## 2017-09-06 NOTE — Addendum Note (Signed)
Addended by: [name] on: 09/06/2017 03:45 PM   Modules accepted: Orders

## 2017-09-06 NOTE — Progress Notes (Signed)
Patient comes in for 2 week blood pressure check.  Patient reports he has been taking Ramipril 10 mg , Coreg 12.5 mg and Lasix 20 mg regularly.   Blood pressure left arm 170/84 pulse 67 O2 98% right arm 150 76 at 153 am.   Let patient rest for 5 minutes and re-checked blood pressure left arm 128/70 pulse 62.    Patient instructed to continue blood pressure medications until he hears back from our office with further instructions.

## 2017-09-06 NOTE — Telephone Encounter (Signed)
Last OV 08/18/17 last filled by Dr.Cook 08/16/16 100 11rf

## 2017-09-06 NOTE — Progress Notes (Signed)
Patient's blood pressure is acceptable on recheck.  He should continue with his current regimen and plan to recheck in the office in 3 months.  He should start checking at home and if it consistently starts to run greater than 140/90 let us know.

## 2017-09-08 NOTE — Progress Notes (Signed)
Patient advised of result and verbalized an understanding.  

## 2017-09-08 NOTE — Progress Notes (Signed)
Left message to return call 

## 2017-09-15 ENCOUNTER — Other Ambulatory Visit: Payer: Self-pay

## 2017-09-15 DIAGNOSIS — D696 Thrombocytopenia, unspecified: Secondary | ICD-10-CM

## 2017-09-21 ENCOUNTER — Ambulatory Visit: Payer: Medicare Other

## 2017-09-21 ENCOUNTER — Telehealth: Payer: Self-pay

## 2017-09-21 ENCOUNTER — Other Ambulatory Visit: Payer: Self-pay | Admitting: Internal Medicine

## 2017-09-21 DIAGNOSIS — H35372 Puckering of macula, left eye: Secondary | ICD-10-CM | POA: Diagnosis not present

## 2017-09-21 DIAGNOSIS — Z961 Presence of intraocular lens: Secondary | ICD-10-CM | POA: Diagnosis not present

## 2017-09-21 DIAGNOSIS — E118 Type 2 diabetes mellitus with unspecified complications: Secondary | ICD-10-CM

## 2017-09-21 NOTE — Telephone Encounter (Signed)
Copied from [place] [id number]. Topic: Inquiry >> Sep 21, 2017 11:09 AM Mylinda Latina, NT wrote: Reason for CRM: Patient called and states he was going to be late to his appt for his medicare wellness appt. He did not want to r/s the appt he states he doesn't really care for this wellness visit and did want to come in .

## 2017-09-22 ENCOUNTER — Encounter (INDEPENDENT_AMBULATORY_CARE_PROVIDER_SITE_OTHER): Payer: Self-pay

## 2017-09-22 ENCOUNTER — Other Ambulatory Visit: Payer: Self-pay

## 2017-09-22 ENCOUNTER — Inpatient Hospital Stay: Payer: Medicare Other | Attending: Oncology | Admitting: Oncology

## 2017-09-22 ENCOUNTER — Inpatient Hospital Stay: Payer: Medicare Other

## 2017-09-22 ENCOUNTER — Encounter: Payer: Self-pay | Admitting: Oncology

## 2017-09-22 VITALS — BP 127/72 | HR 61 | Temp 97.2°F | Resp 20 | Ht 74.0 in | Wt 209.0 lb

## 2017-09-22 DIAGNOSIS — D696 Thrombocytopenia, unspecified: Secondary | ICD-10-CM | POA: Insufficient documentation

## 2017-09-22 DIAGNOSIS — I10 Essential (primary) hypertension: Secondary | ICD-10-CM | POA: Diagnosis not present

## 2017-09-22 DIAGNOSIS — E119 Type 2 diabetes mellitus without complications: Secondary | ICD-10-CM | POA: Diagnosis not present

## 2017-09-22 LAB — CBC WITH DIFFERENTIAL/PLATELET
Basophils Absolute: 0.1 K/uL (ref 0–0.1)
Basophils Relative: 1 %
Eosinophils Absolute: 0.2 K/uL (ref 0–0.7)
Eosinophils Relative: 3 %
HCT: 41.5 % (ref 40.0–52.0)
Hemoglobin: 14.7 g/dL (ref 13.0–18.0)
Lymphocytes Relative: 18 %
Lymphs Abs: 1.4 K/uL (ref 1.0–3.6)
MCH: 33.2 pg (ref 26.0–34.0)
MCHC: 35.4 g/dL (ref 32.0–36.0)
MCV: 93.8 fL (ref 80.0–100.0)
Monocytes Absolute: 0.6 K/uL (ref 0.2–1.0)
Monocytes Relative: 8 %
Neutro Abs: 5.5 K/uL (ref 1.4–6.5)
Neutrophils Relative %: 70 %
Platelets: 113 K/uL — ABNORMAL LOW (ref 150–440)
RBC: 4.43 MIL/uL (ref 4.40–5.90)
RDW: 12.5 % (ref 11.5–14.5)
WBC: 7.9 K/uL (ref 3.8–10.6)

## 2017-09-22 LAB — TECHNOLOGIST SMEAR REVIEW: Tech Review: DECREASED

## 2017-09-22 LAB — VITAMIN B12: Vitamin B-12: 186 pg/mL (ref 180–914)

## 2017-09-22 LAB — FOLATE: Folate: 19.5 ng/mL (ref >5.9)

## 2017-09-25 NOTE — Progress Notes (Signed)
Hematology/Oncology Consult note [facility] Telephone:([phone] Fax:([phone]  Patient Care Team: [name], MD as PCP - General (Family Medicine) Minna Merritts, MD as Consulting Physician (Cardiology)   Name of the patient: [name] [name]  [id number]  Jun 18, 1933    Reason for referral- thrombocytopenia   Referring physician- Dr. Bonna Gains  Date of visit: 09/25/17   History of presenting illness-patient is a 84 year old male with a past medical history significant for hypertension hyperlipidemia type 2 diabetes insulin-dependent and BPH among other medical problems.  He was recently seen by Dr. Bonna Gains from GI for evaluation of abnormal LFTs.  He has been referred to [place] for thrombocytopenia.  Looking back at his CBCs patient has had mild chronic thrombocytopenia with a platelet count fluctuates between 90s to 110s since 2013.  In March 2013 his platelet count was 122.  White count and hemoglobin have always been normal.  Recent CBC from 08/31/2017 showed white count of 5.9, H&H of 14.4/30.1 and a platelet count of 105.  Patient reports feeling well.  He denies any complaints today.  Denies any symptoms of bleeding in his urine or stool.  He does report some easy bruising especially over his forearms.  He is not on any blood thinners.  ECOG PS- 1  Pain scale- 0   Review of systems- Review of Systems  Constitutional: Positive for malaise/fatigue. Negative for chills, fever and weight loss.  HENT: Negative for congestion, ear discharge and nosebleeds.   Eyes: Negative for blurred vision.  Respiratory: Negative for cough, hemoptysis, sputum production, shortness of breath and wheezing.   Cardiovascular: Negative for chest pain, palpitations, orthopnea and claudication.  Gastrointestinal: Negative for abdominal pain, blood in stool, constipation, diarrhea, heartburn, melena, nausea and vomiting.  Genitourinary: Negative for dysuria, flank  pain, frequency, hematuria and urgency.  Musculoskeletal: Negative for back pain, joint pain and myalgias.  Skin: Negative for rash.  Neurological: Negative for dizziness, tingling, focal weakness, seizures, weakness and headaches.  Endo/Heme/Allergies: Does not bruise/bleed easily.  Psychiatric/Behavioral: Negative for depression and suicidal ideas. The patient does not have insomnia.     Allergies  Allergen Reactions  . Cortisone Other (See Comments)    Leg Cramps  . Metformin And Related Diarrhea    Patient Active Problem List   Diagnosis Date Noted  . Bell's palsy 08/18/2017  . H/O cold sores 08/18/2017  . Bronchitis 11/29/2016  . Irregular heart beat 11/29/2016  . Insomnia 11/10/2016  . PLMD (periodic limb movement disorder) 08/17/2016  . Mild OSA 08/17/2016  . Intolerance of continuous positive airway pressure (CPAP) ventilation 08/17/2016  . Chronic arthritis 06/02/2016  . CKD (chronic kidney disease) stage 3, GFR 30-59 ml/min (HCC) 06/02/2016  . S/P CABG x 4   . Type 2 diabetes mellitus with complications ([place]) 99/83/3825  . Chronic diastolic CHF (congestive heart failure) ([place]) 01/01/2014  . CAD (coronary artery disease) 06/03/2013  . BPH (benign prostatic hyperplasia)   . GERD (gastroesophageal reflux disease)   . Hyperlipidemia 09/15/2009  . Essential hypertension 09/15/2009     Past Medical History:  Diagnosis Date  . BPH (benign prostatic hyperplasia)   . Cancer ([place])    skin  . Cataract   . Coronary artery disease    a. 4v CABG 01/2005; b. cath 06/05/13 showed patent grafts: LIMA-LAD, VG-D, VG-OM3, VG-dRCA, nl filling pressures, nl LV fxn  . Diastolic dysfunction    a. echo 0/5397: nl systolic fxn, mild LVH, diastolic relaxation abnormality, mildly enlarged LA,  mild Ao insufficiency  . Dyspnea    with exertion  . ED (erectile dysfunction)   . GERD (gastroesophageal reflux disease)   . Hyperlipidemia   . Hypertension   . IDDM (insulin dependent  diabetes mellitus) ([place]) 2000  . Osteoarthritis   . Perirectal fistula   . Pneumonia 12/2016  . Tubular adenoma of colon 07/2012  . Vertigo      Past Surgical History:  Procedure Laterality Date  . BACK SURGERY    . CARDIAC CATHETERIZATION  06/05/2013   [facility]   Marland Kitchen CARDIAC CATHETERIZATION N/A 09/24/2015   Procedure: Right Heart Cath and Coronary/Graft Angiography;  Surgeon: Minna Merritts, MD;  Location: [place] [facility];  Service: Cardiovascular;  Laterality: N/A;  . CATARACT EXTRACTION  sept 2013   right  . CATARACT EXTRACTION W/PHACO Left 03/28/2017   Procedure: CATARACT EXTRACTION PHACO AND INTRAOCULAR LENS PLACEMENT (IOC);  Surgeon: Birder Robson, MD;  Location: [facility];  Service: Ophthalmology;  Laterality: Left;  [place] 00:42.0 AP% 15.0 CDE 6.31 Fluid Pack lot # [id number]  . CHOLECYSTECTOMY  05/15/2011   Procedure: LAPAROSCOPIC CHOLECYSTECTOMY;  Surgeon: Rolm Bookbinder, MD;  Location: [place];  Service: General;  Laterality: N/A;  . COLONOSCOPY W/ POLYPECTOMY    . CORONARY ARTERY BYPASS GRAFT  2007   x 4  . FOOT SURGERY  2012   right foot  . JOINT REPLACEMENT  8/10   Right THR--[name]  . LEFT AND RIGHT HEART CATHETERIZATION WITH CORONARY/GRAFT ANGIOGRAM N/A 06/05/2013   Procedure: LEFT AND RIGHT HEART CATHETERIZATION WITH Beatrix Fetters;  Surgeon: Peter M Martinique, MD;  Location: [facility] CATH LAB;  Service: Cardiovascular;  Laterality: N/A;  . LUMBAR LAMINECTOMY  1989  . Prostate photovaporization  5/16   Dr Budd Palmer    Social History   Socioeconomic History  . Marital status: Married    Spouse name: Not on file  . Number of children: 4  . Years of education: Not on file  . Highest education level: Not on file  Occupational History  . 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 Financial resource strain: Not on file  . Food insecurity:    Worry: Not on file    Inability: Not on file  .  Transportation needs:    Medical: Not on file    Non-medical: Not on file  Tobacco Use  . Smoking status: Never Smoker  . Smokeless tobacco: Never Used  Substance and Sexual Activity  . Alcohol use: Yes    Comment: occ cocktail  . Drug use: No  . Sexual activity: Not on file  Lifestyle  . Physical activity:    Days per week: Not on file    Minutes per session: Not on file  . Stress: Not on file  Relationships  . Social connections:    Talks on phone: Not on file    Gets together: Not on file    Attends religious service: Not on file    Active member of club or organization: Not on file    Attends meetings of clubs or organizations: Not on file    Relationship status: Not on file  . Intimate partner violence:    Fear of current or ex partner: Not on file    Emotionally abused: Not on file    Physically abused: Not on file    Forced sexual activity: Not on file  Other Topics Concern  . Not on file  Social History Narrative  Goes by [name]   Has living will    Has designated [name] as health care POA   Would accept resuscitation attempts   Would not want tube feeds if cognitively unaware     Family History  Problem Relation Age of Onset  . Lung cancer Mother   . Heart disease Father   . Colon cancer Neg Hx      Current Outpatient Medications:  .  acetaminophen (TYLENOL) 500 MG tablet, Take 1,000 mg by mouth 2 (two) times daily., Disp: , Rfl:  .  aspirin 81 MG tablet, Take 81 mg by mouth daily. , Disp: , Rfl:  .  BD INSULIN SYRINGE ULTRAFINE 31G X 15/64" 0.3 ML MISC, USE AS DIRECTED, Disp: 100 each, Rfl: 3 .  blood glucose meter kit and supplies KIT, OneTouch Verio w/ Device Kit OneTouch Verio VI STRP OneTouch Delica Lancet Dev misc OneTouch Delica Lancets 16R or Fine  Please check CBGs fasting and 2 other times during the day  Dx Code E11.8, Disp: 1 each, Rfl: 0 .  carvedilol (COREG) 12.5 MG tablet, Take 1 tablet (12.5 mg total) by mouth 2 (two) times  daily., Disp: 180 tablet, Rfl: 4 .  finasteride (PROSCAR) 5 MG tablet, Take 1 tablet (5 mg total) by mouth daily. (Patient taking differently: Take 5 mg by mouth at bedtime. ), Disp: 90 tablet, Rfl: 1 .  furosemide (LASIX) 20 MG tablet, TAKE 1 TABLET BY MOUTH TWICE DAILY AS NEEDED (Patient taking differently: TAKE 1 TABLET BY MOUTH DAILY AS NEEDED), Disp: 90 tablet, Rfl: 3 .  Insulin Pen Needle (BD PEN NEEDLE NANO U/F) 32G X 4 MM MISC, USE AS DIRECTED, Disp: 100 each, Rfl: 11 .  potassium chloride (K-DUR) 10 MEQ tablet, TAKE 1 TABLET DAILY, Disp: 90 tablet, Rfl: 3 .  ramipril (ALTACE) 10 MG capsule, Take 1 capsule (10 mg total) by mouth daily., Disp: 90 capsule, Rfl: 4 .  rosuvastatin (CRESTOR) 20 MG tablet, Take 1 tablet (20 mg total) by mouth daily. (Patient taking differently: Take 20 mg every other day by mouth. ), Disp: 90 tablet, Rfl: 4 .  TOUJEO SOLOSTAR 300 UNIT/ML SOPN, INJECT 50-60 UNITS INTO THE SKIN DAILY .INCREASE BY 1 UNITS/DAY UNTILFBS ARE BETWEEN 80-130. DO NOT EXCEED 60 UNITS PER DAY., Disp: 4.5 mL, Rfl: 6 .  traZODone (DESYREL) 50 MG tablet, Take 1-2 tablets (50-100 mg total) by mouth at bedtime as needed for sleep., Disp: 90 tablet, Rfl: 3 .  Vitamin D, Cholecalciferol, 1000 units TABS, Take 1,000 Units by mouth daily. , Disp: , Rfl:    Physical exam:  Vitals:   09/22/17 1431  BP: 127/72  Pulse: 61  Resp: 20  Temp: (!) 97.2 F (36.2 C)  TempSrc: Tympanic  Weight: 209 lb (94.8 kg)  Height: _0  (1.88 m)   Physical Exam  Constitutional: He is oriented to person, place, and time. He appears well-developed and well-nourished.  HENT:  Head: Normocephalic and atraumatic.  Eyes: Pupils are equal, round, and reactive to light. EOM are normal.  Neck: Normal range of motion.  Cardiovascular: Normal rate, regular rhythm and normal heart sounds.  Pulmonary/Chest: Effort normal and breath sounds normal.  Abdominal: Soft. Bowel sounds are normal.  Neurological: He is alert and  oriented to person, place, and time.  Skin: Skin is warm and dry.       CMP Latest Ref Rng & Units 08/31/2017  Glucose 65 - 99 mg/dL 261(H)  BUN 8 - 27 mg/dL  19  Creatinine 0.76 - 1.27 mg/dL 1.35(H)  Sodium 134 - 144 mmol/L 141  Potassium 3.5 - 5.2 mmol/L 4.9  Chloride 96 - 106 mmol/L 106  CO2 20 - 29 mmol/L 23  Calcium 8.6 - 10.2 mg/dL 9.1  Total Protein 6.0 - 8.5 g/dL 6.2  Total Bilirubin 0.0 - 1.2 mg/dL 1.1  Alkaline Phos 39 - 117 IU/L 120(H)  AST 0 - 40 IU/L 18  ALT 0 - 44 IU/L 14   CBC Latest Ref Rng & Units 09/22/2017  WBC 3.8 - 10.6 K/uL 7.9  Hemoglobin 13.0 - 18.0 g/dL 14.7  Hematocrit 40.0 - 52.0 % 41.5  Platelets 150 - 440 K/uL 113(L)    No images are attached to the encounter.  US Abdomen Limited Ruq  Result Date: 09/06/2017 CLINICAL DATA:  Elevated liver enzymes. EXAM: ULTRASOUND ABDOMEN LIMITED RIGHT UPPER QUADRANT COMPARISON:  CT scan of March 09, 2015. Ultrasound of May 30, 2012. FINDINGS: Gallbladder: Status post cholecystectomy. Common bile duct: Diameter: 5.8 mm which is within normal limits. Liver: No focal lesion identified. Within normal limits in parenchymal echogenicity. Portal vein is patent on color Doppler imaging with normal direction of blood flow towards the liver. IMPRESSION: Status post cholecystectomy. No significant abnormality seen in the right upper quadrant of the abdomen. Electronically Signed   By: Marijo Conception, M.D.   On: 09/06/2017 12:37    Assessment and plan- Patient is a 84 y.o. male referred for thrombocytopenia  Patient has mild isolated thrombocytopenia with a platelet count that waxes and wanes between 90s to 110 dating back to at least 2013.  No concomitant issues with white count or hemoglobin.  He is not on any drugs which would lead to obvious thrombocytopenia.  He denies any use of over-the-counter drugs or herbal medications.  This is likely ITP which can be monitored conservatively without any active intervention at this  time.  I will check a CBC, B12, folate and technologist smear review at this time.  His hepatitis C testing recently has been negative.  Given the stability of his platelet count I will see him back in 1 years time.  We will call him with the results of the blood work if anything is abnormal   Thank you for this kind referral and the opportunity to participate in the care of this patient   Visit Diagnosis 1. Thrombocytopenia ([place])     Dr. Randa Evens, MD, MPH [facility] at [facility] [phone] 09/25/2017 9:32 AM

## 2017-09-25 NOTE — Progress Notes (Deleted)
Hematology/Oncology Consult note [facility]  [phone] [phone]  Patient Care Team: [name], MD as PCP - General (Family Medicine) Minna Merritts, MD as Consulting Physician (Cardiology)   Name of the patient: [name] [name]  [id number]  1933/06/16   Date of visit: 09/25/17  Diagnosis- ***  Chief complaint/ Reason for visit- ***  Heme/Onc history: ***  Interval history- ***  ECOG PS- *** Pain scale- *** Opioid associated constipation- ***  Review of systems- ROS   Current treatment- ***  Allergies  Allergen Reactions  . Cortisone Other (See Comments)    Leg Cramps  . Metformin And Related Diarrhea     Past Medical History:  Diagnosis Date  . BPH (benign prostatic hyperplasia)   . Cancer ([place])    skin  . Cataract   . Coronary artery disease    a. 4v CABG 01/2005; b. cath 06/05/13 showed patent grafts: LIMA-LAD, VG-D, VG-OM3, VG-dRCA, nl filling pressures, nl LV fxn  . Diastolic dysfunction    a. echo 08/3014: nl systolic fxn, mild LVH, diastolic relaxation abnormality, mildly enlarged LA, mild Ao insufficiency  . Dyspnea    with exertion  . ED (erectile dysfunction)   . GERD (gastroesophageal reflux disease)   . Hyperlipidemia   . Hypertension   . IDDM (insulin dependent diabetes mellitus) ([place]) 2000  . Osteoarthritis   . Perirectal fistula   . Pneumonia 12/2016  . Tubular adenoma of colon 07/2012  . Vertigo      Past Surgical History:  Procedure Laterality Date  . BACK SURGERY    . CARDIAC CATHETERIZATION  06/05/2013   [facility]   Marland Kitchen CARDIAC CATHETERIZATION N/A 09/24/2015   Procedure: Right Heart Cath and Coronary/Graft Angiography;  Surgeon: Minna Merritts, MD;  Location: [place] [facility];  Service: Cardiovascular;  Laterality: N/A;  . CATARACT EXTRACTION  sept 2013   right  . CATARACT EXTRACTION W/PHACO Left 03/28/2017   Procedure: CATARACT EXTRACTION PHACO AND INTRAOCULAR LENS  PLACEMENT (IOC);  Surgeon: Birder Robson, MD;  Location: [facility];  Service: Ophthalmology;  Laterality: Left;  [place] 00:42.0 AP% 15.0 CDE 6.31 Fluid Pack lot # [id number]  . CHOLECYSTECTOMY  05/15/2011   Procedure: LAPAROSCOPIC CHOLECYSTECTOMY;  Surgeon: Rolm Bookbinder, MD;  Location: [place];  Service: General;  Laterality: N/A;  . COLONOSCOPY W/ POLYPECTOMY    . CORONARY ARTERY BYPASS GRAFT  2007   x 4  . FOOT SURGERY  2012   right foot  . JOINT REPLACEMENT  8/10   Right THR--[name]  . LEFT AND RIGHT HEART CATHETERIZATION WITH CORONARY/GRAFT ANGIOGRAM N/A 06/05/2013   Procedure: LEFT AND RIGHT HEART CATHETERIZATION WITH Beatrix Fetters;  Surgeon: Peter M Martinique, MD;  Location[facility] C[facility] LAB;  Service: Cardiovascular;  Laterality: N/A;  . LUMBAR LAMINECTOMY  1989  . Prostate photovaporization  5/16   Dr Budd Palmer    Social History   Socioeconomic History  . Marital status: Married    Spouse name: Not on file  . Number of children: 4  . Years of education: Not on file  . Highest education level: Not on file  Occupational History  . 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 Financial resource strain: Not on file  . Food insecurity:    Worry: Not on file    Inability: Not on file  . Transportation needs:    Medical: Not on file    Non-medical: Not on  file  Tobacco Use  . Smoking status: Never Smoker  . Smokeless tobacco: Never Used  Substance and Sexual Activity  . Alcohol use: Yes    Comment: occ cocktail  . Drug use: No  . Sexual activity: Not on file  Lifestyle  . Physical activity:    Days per week: Not on file    Minutes per session: Not on file  . Stress: Not on file  Relationships  . Social connections:    Talks on phone: Not on file    Gets together: Not on file    Attends religious service: Not on file    Active member of club or organization: Not on file    Attends meetings of clubs or  organizations: Not on file    Relationship status: Not on file  . Intimate partner violence:    Fear of current or ex partner: Not on file    Emotionally abused: Not on file    Physically abused: Not on file    Forced sexual activity: Not on file  Other Topics Concern  . Not on file  Social History Narrative   Goes by [name]   Has living will    Has designated [name] as health care POA   Would accept resuscitation attempts   Would not want tube feeds if cognitively unaware    Family History  Problem Relation Age of Onset  . Lung cancer Mother   . Heart disease Father   . Colon cancer Neg Hx      Current Outpatient Medications:  .  acetaminophen (TYLENOL) 500 MG tablet, Take 1,000 mg by mouth 2 (two) times daily., Disp: , Rfl:  .  aspirin 81 MG tablet, Take 81 mg by mouth daily. , Disp: , Rfl:  .  BD INSULIN SYRINGE ULTRAFINE 31G X 15/64" 0.3 ML MISC, USE AS DIRECTED, Disp: 100 each, Rfl: 3 .  blood glucose meter kit and supplies KIT, OneTouch Verio w/ Device Kit OneTouch Verio VI STRP OneTouch Delica Lancet Dev misc OneTouch Delica Lancets 89F or Fine  Please check CBGs fasting and 2 other times during the day  Dx Code E11.8, Disp: 1 each, Rfl: 0 .  carvedilol (COREG) 12.5 MG tablet, Take 1 tablet (12.5 mg total) by mouth 2 (two) times daily., Disp: 180 tablet, Rfl: 4 .  finasteride (PROSCAR) 5 MG tablet, Take 1 tablet (5 mg total) by mouth daily. (Patient taking differently: Take 5 mg by mouth at bedtime. ), Disp: 90 tablet, Rfl: 1 .  furosemide (LASIX) 20 MG tablet, TAKE 1 TABLET BY MOUTH TWICE DAILY AS NEEDED (Patient taking differently: TAKE 1 TABLET BY MOUTH DAILY AS NEEDED), Disp: 90 tablet, Rfl: 3 .  Insulin Pen Needle (BD PEN NEEDLE NANO U/F) 32G X 4 MM MISC, USE AS DIRECTED, Disp: 100 each, Rfl: 11 .  potassium chloride (K-DUR) 10 MEQ tablet, TAKE 1 TABLET DAILY, Disp: 90 tablet, Rfl: 3 .  ramipril (ALTACE) 10 MG capsule, Take 1 capsule (10 mg total) by mouth  daily., Disp: 90 capsule, Rfl: 4 .  rosuvastatin (CRESTOR) 20 MG tablet, Take 1 tablet (20 mg total) by mouth daily. (Patient taking differently: Take 20 mg every other day by mouth. ), Disp: 90 tablet, Rfl: 4 .  TOUJEO SOLOSTAR 300 UNIT/ML SOPN, INJECT 50-60 UNITS INTO THE SKIN DAILY .INCREASE BY 1 UNITS/DAY UNTILFBS ARE BETWEEN 80-130. DO NOT EXCEED 60 UNITS PER DAY., Disp: 4.5 mL, Rfl: 6 .  traZODone (DESYREL) 50 MG tablet,  Take 1-2 tablets (50-100 mg total) by mouth at bedtime as needed for sleep., Disp: 90 tablet, Rfl: 3 .  Vitamin D, Cholecalciferol, 1000 units TABS, Take 1,000 Units by mouth daily. , Disp: , Rfl:   Physical exam:  Vitals:   09/22/17 1431  BP: 127/72  Pulse: 61  Resp: 20  Temp: (!) 97.2 F (36.2 C)  TempSrc: Tympanic  Weight: 209 lb (94.8 kg)  Height: 6' 2"  (1.88 m)   Physical Exam   CMP Latest Ref Rng & Units 08/31/2017  Glucose 65 - 99 mg/dL 261(H)  BUN 8 - 27 mg/dL 19  Creatinine 0.76 - 1.27 mg/dL 1.35(H)  Sodium 134 - 144 mmol/L 141  Potassium 3.5 - 5.2 mmol/L 4.9  Chloride 96 - 106 mmol/L 106  CO2 20 - 29 mmol/L 23  Calcium 8.6 - 10.2 mg/dL 9.1  Total Protein 6.0 - 8.5 g/dL 6.2  Total Bilirubin 0.0 - 1.2 mg/dL 1.1  Alkaline Phos 39 - 117 IU/L 120(H)  AST 0 - 40 IU/L 18  ALT 0 - 44 IU/L 14   CBC Latest Ref Rng & Units 09/22/2017  WBC 3.8 - 10.6 K/uL 7.9  Hemoglobin 13.0 - 18.0 g/dL 14.7  Hematocrit 40.0 - 52.0 % 41.5  Platelets 150 - 440 K/uL 113(L)    No images are attached to the encounter.  US Abdomen Limited Ruq  Result Date: 09/06/2017 CLINICAL DATA:  Elevated liver enzymes. EXAM: ULTRASOUND ABDOMEN LIMITED RIGHT UPPER QUADRANT COMPARISON:  CT scan of March 09, 2015. Ultrasound of May 30, 2012. FINDINGS: Gallbladder: Status post cholecystectomy. Common bile duct: Diameter: 5.8 mm which is within normal limits. Liver: No focal lesion identified. Within normal limits in parenchymal echogenicity. Portal vein is patent on color Doppler imaging  with normal direction of blood flow towards the liver. IMPRESSION: Status post cholecystectomy. No significant abnormality seen in the right upper quadrant of the abdomen. Electronically Signed   By: Marijo Conception, M.D.   On: 09/06/2017 12:37     Assessment and plan- Patient is a 84 y.o. male ***   Visit Diagnosis 1. Thrombocytopenia ([place])      Dr. Randa Evens, MD, MPH [facility] at [facility] [phone] 09/25/2017 9:21 AM

## 2017-09-26 ENCOUNTER — Telehealth: Payer: Self-pay | Admitting: *Deleted

## 2017-09-26 DIAGNOSIS — D696 Thrombocytopenia, unspecified: Secondary | ICD-10-CM

## 2017-09-26 DIAGNOSIS — E538 Deficiency of other specified B group vitamins: Secondary | ICD-10-CM

## 2017-09-26 NOTE — Telephone Encounter (Signed)
I have entered an appt for him 3 months with labs and mailed it to his home.

## 2017-09-26 NOTE — Telephone Encounter (Signed)
Called pt and let him know that b12 levels was low and she rec: that pt take b12 over the counter and it is 1000 mcg tablet per day.  He is agreeable. I told him since he is agreeable to start the oral med then we will need to check levels again in 71mos. He is agreeable and I will send him a new appt.

## 2017-09-26 NOTE — Telephone Encounter (Signed)
-----   Message from Sindy Guadeloupe, MD sent at 09/25/2017  7:49 AM EDT ----- Please let let him know b12 levels are low. He needs to take po b12 1000 mcg daily. Let us repeat cbc and b12 in 3 months. If they are still low, we will switch him to b12 shots.  Thanks, Astrid Divine

## 2017-10-05 ENCOUNTER — Encounter: Payer: Self-pay | Admitting: Internal Medicine

## 2017-10-05 ENCOUNTER — Ambulatory Visit (INDEPENDENT_AMBULATORY_CARE_PROVIDER_SITE_OTHER): Payer: Medicare Other | Admitting: Internal Medicine

## 2017-10-05 VITALS — BP 140/62 | HR 62 | Temp 98.4°F | Ht 74.0 in | Wt 212.0 lb

## 2017-10-05 DIAGNOSIS — T148XXA Other injury of unspecified body region, initial encounter: Secondary | ICD-10-CM

## 2017-10-05 DIAGNOSIS — S80822A Blister (nonthermal), left lower leg, initial encounter: Secondary | ICD-10-CM | POA: Diagnosis not present

## 2017-10-05 DIAGNOSIS — L03116 Cellulitis of left lower limb: Secondary | ICD-10-CM

## 2017-10-05 MED ORDER — MUPIROCIN 2 % EX OINT: 1.0000 "application " | TOPICAL_OINTMENT | Freq: Two times a day (BID) | CUTANEOUS | 0 refills | Status: DC

## 2017-10-05 MED ORDER — DOXYCYCLINE HYCLATE 100 MG PO TABS: 100.0000 mg | ORAL_TABLET | Freq: Two times a day (BID) | ORAL | 0 refills | Status: DC

## 2017-10-05 NOTE — Progress Notes (Signed)
Chief Complaint  Patient presents with  . Acute Visit    blister on leg   F/u with wife 1. Left leg blister not healing x 3 weeks and draining clear drainage no trauma or insect bite no fever. Area is red surrounding Area started smaller and resolved then larger area came back   Review of Systems  Skin:       +left leg skin blister   Past Medical History:  Diagnosis Date  . BPH (benign prostatic hyperplasia)   . Cancer ([place])    skin  . Cataract   . Coronary artery disease    a. 4v CABG 01/2005; b. cath 06/05/13 showed patent grafts: LIMA-LAD, VG-D, VG-OM3, VG-dRCA, nl filling pressures, nl LV fxn  . Diastolic dysfunction    a. echo 06/979: nl systolic fxn, mild LVH, diastolic relaxation abnormality, mildly enlarged LA, mild Ao insufficiency  . Dyspnea    with exertion  . ED (erectile dysfunction)   . GERD (gastroesophageal reflux disease)   . Hyperlipidemia   . Hypertension   . IDDM (insulin dependent diabetes mellitus) ([place]) 2000  . Osteoarthritis   . Perirectal fistula   . Pneumonia 12/2016  . Tubular adenoma of colon 07/2012  . Vertigo    Past Surgical History:  Procedure Laterality Date  . BACK SURGERY    . CARDIAC CATHETERIZATION  06/05/2013   [facility]   Marland Kitchen CARDIAC CATHETERIZATION N/A 09/24/2015   Procedure: Right Heart Cath and Coronary/Graft Angiography;  Surgeon: Minna Merritts, MD;  Location: [facility];  Service: Cardiovascular;  Laterality: N/A;  . CATARACT EXTRACTION  sept 2013   right  . CATARACT EXTRACTION W/PHACO Left 03/28/2017   Procedure: CATARACT EXTRACTION PHACO AND INTRAOCULAR LENS PLACEMENT (IOC);  Surgeon: Birder Robson, MD;  Location: [facility];  Service: Ophthalmology;  Laterality: Left;  [place] 00:42.0 AP% 15.0 CDE 6.31 Fluid Pack lot # [id number]  . CHOLECYSTECTOMY  05/15/2011   Procedure: LAPAROSCOPIC CHOLECYSTECTOMY;  Surgeon: Rolm Bookbinder, MD;  Location: [facility];  Service: General;  Laterality: N/A;  . COLONOSCOPY W/ POLYPECTOMY      . CORONARY ARTERY BYPASS GRAFT  2007   x 4  . FOOT SURGERY  2012   right foot  . JOINT REPLACEMENT  8/10   Right THR--[name]  . LEFT AND RIGHT HEART CATHETERIZATION WITH CORONARY/GRAFT ANGIOGRAM N/A 06/05/2013   Procedure: LEFT AND RIGHT HEART CATHETERIZATION WITH Beatrix Fetters;  Surgeon: Peter M Martinique, MD;  Location: [facility];  Service: Cardiovascular;  Laterality: N/A;  . LUMBAR LAMINECTOMY  1989  . Prostate photovaporization  5/16   Dr Budd Palmer   Family History  Problem Relation Age of Onset  . Lung cancer Mother   . Heart disease Father   . Colon cancer Neg Hx    Social History   Socioeconomic History  . Marital status: Married    Spouse name: Not on file  . Number of children: 4  . Years of education: Not on file  . Highest education level: Not on file  Occupational History  . 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 Financial resource strain: Not on file  . Food insecurity:    Worry: Not on file    Inability: Not on file  . Transportation needs:    Medical: Not on file    Non-medical: Not on file  Tobacco Use  . Smoking status: Never Smoker  . Smokeless tobacco: Never Used  Substance and  Sexual Activity  . Alcohol use: Yes    Comment: occ cocktail  . Drug use: No  . Sexual activity: Not on file  Lifestyle  . Physical activity:    Days per week: Not on file    Minutes per session: Not on file  . Stress: Not on file  Relationships  . Social connections:    Talks on phone: Not on file    Gets together: Not on file    Attends religious service: Not on file    Active member of club or organization: Not on file    Attends meetings of clubs or organizations: Not on file    Relationship status: Not on file  . Intimate partner violence:    Fear of current or ex partner: Not on file    Emotionally abused: Not on file    Physically abused: Not on file    Forced sexual activity: Not on  file  Other Topics Concern  . Not on file  Social History Narrative   Goes by [name]   Has living will    Has designated [name] as health care POA   Would accept resuscitation attempts   Would not want tube feeds if cognitively unaware   Current Meds  Medication Sig  . acetaminophen (TYLENOL) 500 MG tablet Take 1,000 mg by mouth 2 (two) times daily.  Marland Kitchen aspirin 81 MG tablet Take 81 mg by mouth daily.   . BD INSULIN SYRINGE ULTRAFINE 31G X 15/64" 0.3 ML MISC USE AS DIRECTED  . blood glucose meter kit and supplies KIT OneTouch Verio w/ Device Kit OneTouch Verio VI STRP OneTouch Delica Lancet Dev misc OneTouch Delica Lancets 16X or Fine  Please check CBGs fasting and 2 other times during the day  Dx Code E11.8  . carvedilol (COREG) 12.5 MG tablet Take 1 tablet (12.5 mg total) by mouth 2 (two) times daily.  . finasteride (PROSCAR) 5 MG tablet Take 1 tablet (5 mg total) by mouth daily. (Patient taking differently: Take 5 mg by mouth at bedtime. )  . furosemide (LASIX) 20 MG tablet TAKE 1 TABLET BY MOUTH TWICE DAILY AS NEEDED (Patient taking differently: TAKE 1 TABLET BY MOUTH DAILY AS NEEDED)  . Insulin Pen Needle (BD PEN NEEDLE NANO U/F) 32G X 4 MM MISC USE AS DIRECTED  . potassium chloride (K-DUR) 10 MEQ tablet TAKE 1 TABLET DAILY  . ramipril (ALTACE) 10 MG capsule Take 1 capsule (10 mg total) by mouth daily.  . rosuvastatin (CRESTOR) 20 MG tablet Take 1 tablet (20 mg total) by mouth daily. (Patient taking differently: Take 20 mg every other day by mouth. )  . TOUJEO SOLOSTAR 300 UNIT/ML SOPN INJECT 50-60 UNITS INTO THE SKIN DAILY .INCREASE BY 1 UNITS/DAY UNTILFBS ARE BETWEEN 80-130. DO NOT EXCEED 60 UNITS PER DAY.  . traZODone (DESYREL) 50 MG tablet Take 1-2 tablets (50-100 mg total) by mouth at bedtime as needed for sleep.  . Vitamin D, Cholecalciferol, 1000 units TABS Take 1,000 Units by mouth daily.    Allergies  Allergen Reactions  . Cortisone Other (See Comments)     Leg Cramps  . Metformin And Related Diarrhea   Recent Results (from the past 2160 hour(s))  Comp Met (CMET)     Status: Abnormal   Collection Time: 08/18/17 12:05 PM  Result Value Ref Range   Sodium 138 135 - 145 mEq/L   Potassium 4.4 3.5 - 5.1 mEq/L   Chloride 106 96 - 112 mEq/L  CO2 27 19 - 32 mEq/L   Glucose, Bld 276 (H) 70 - 99 mg/dL   BUN 17 6 - 23 mg/dL   Creatinine, Ser 1.22 0.40 - 1.50 mg/dL   Total Bilirubin 1.1 0.2 - 1.2 mg/dL   Alkaline Phosphatase 118 (H) 39 - 117 U/L   AST 15 0 - 37 U/L   ALT 13 0 - 53 U/L   Total Protein 6.1 6.0 - 8.3 g/dL   Albumin 3.9 3.5 - 5.2 g/dL   Calcium 8.9 8.4 - 10.5 mg/dL   GFR 60.47 >60.00 mL/min  HgB A1c     Status: Abnormal   Collection Time: 08/18/17 12:05 PM  Result Value Ref Range   Hgb A1c MFr Bld 7.7 (H) 4.6 - 6.5 %    Comment: Glycemic Control Guidelines for People with Diabetes:Non Diabetic:  <6%Goal of Therapy: <7%Additional Action Suggested:  >8%   Lipid panel     Status: Abnormal   Collection Time: 08/18/17 12:05 PM  Result Value Ref Range   Cholesterol 99 0 - 200 mg/dL    Comment: ATP III Classification       Desirable:  < 200 mg/dL               Borderline High:  200 - 239 mg/dL          High:  > = 240 mg/dL   Triglycerides 137.0 0.0 - 149.0 mg/dL    Comment: Normal:  <150 mg/dLBorderline High:  150 - 199 mg/dL   HDL 31.00 (L) >39.00 mg/dL   VLDL 27.4 0.0 - 40.0 mg/dL   LDL Cholesterol 41 0 - 99 mg/dL   Total CHOL/HDL Ratio 3     Comment:                Men          Women1/2 Average Risk     3.4          3.3Average Risk          5.0          4.42X Average Risk          9.6          7.13X Average Risk          15.0          11.0                       NonHDL 68.08     Comment: NOTE:  Non-HDL goal should be 30 mg/dL higher than patient's LDL goal (i.e. LDL goal of < 70 mg/dL, would have non-HDL goal of < 100 mg/dL)  Gamma GT     Status: Abnormal   Collection Time: 08/22/17  2:02 PM  Result Value Ref Range   GGT 52 (H) 7  - 51 U/L  Hepatic function panel     Status: Abnormal   Collection Time: 08/22/17  2:02 PM  Result Value Ref Range   Total Bilirubin 1.0 0.2 - 1.2 mg/dL   Bilirubin, Direct 0.2 0.0 - 0.3 mg/dL   Alkaline Phosphatase 119 (H) 39 - 117 U/L   AST 16 0 - 37 U/L   ALT 14 0 - 53 U/L   Total Protein 6.1 6.0 - 8.3 g/dL   Albumin 4.0 3.5 - 5.2 g/dL  Comprehensive metabolic panel     Status: Abnormal   Collection Time: 08/31/17 11:25 AM  Result Value Ref Range   Glucose  261 (H) 65 - 99 mg/dL   BUN 19 8 - 27 mg/dL   Creatinine, Ser 1.35 (H) 0.76 - 1.27 mg/dL   GFR calc non Af Amer 49 (L) >59 mL/min/1.73   GFR calc Af Amer 57 (L) >59 mL/min/1.73   BUN/Creatinine Ratio 14 10 - 24   Sodium 141 134 - 144 mmol/L   Potassium 4.9 3.5 - 5.2 mmol/L   Chloride 106 96 - 106 mmol/L   CO2 23 20 - 29 mmol/L   Calcium 9.1 8.6 - 10.2 mg/dL   Total Protein 6.2 6.0 - 8.5 g/dL   Albumin 4.0 3.5 - 4.7 g/dL   Globulin, Total 2.2 1.5 - 4.5 g/dL   Albumin/Globulin Ratio 1.8 1.2 - 2.2   Bilirubin Total 1.1 0.0 - 1.2 mg/dL   Alkaline Phosphatase 120 (H) 39 - 117 IU/L   AST 18 0 - 40 IU/L   ALT 14 0 - 44 IU/L  CBC     Status: Abnormal   Collection Time: 08/31/17 11:25 AM  Result Value Ref Range   WBC 5.9 3.4 - 10.8 x10E3/uL   RBC 4.30 4.14 - 5.80 x10E6/uL   Hemoglobin 14.4 13.0 - 17.7 g/dL   Hematocrit 40.1 37.5 - 51.0 %   MCV 93 79 - 97 fL   MCH 33.5 (H) 26.6 - 33.0 pg   MCHC 35.9 (H) 31.5 - 35.7 g/dL   RDW 13.1 12.3 - 15.4 %   Platelets 105 (L) 150 - 450 x10E3/uL  Mitochondrial/smooth muscle ab pnl     Status: None   Collection Time: 08/31/17 11:25 AM  Result Value Ref Range   Smooth Muscle Ab 5 0 - 19 Units    Comment:                  Negative                     0 - 19                  Weak positive               20 - 30                  Moderate to strong positive     >30  Actin Antibodies are found in 52-85% of patients with  autoimmune hepatitis or chronic active hepatitis and  in 22% of  patients with primary biliary cirrhosis.    Mitochondrial Ab <20.0 0.0 - 20.0 Units    Comment:                                 Negative    0.0 - 20.0                                 Equivocal  20.1 - 24.9                                 Positive         >24.9 Mitochondrial (M2) Antibodies are found in 90-96% of patients with primary biliary cirrhosis.   Hepatitis B surface antibody     Status: None   Collection Time: 08/31/17 11:25 AM  Result Value Ref Range   Hep B Surface Ab,  Qual Non Reactive     Comment:               Non Reactive: Inconsistent with immunity,                             less than 10 mIU/mL               Reactive:     Consistent with immunity,                             greater than 9.9 mIU/mL   Hepatitis c antibody (reflex)     Status: None   Collection Time: 08/31/17 11:25 AM  Result Value Ref Range   HCV Ab <0.1 0.0 - 0.9 s/co ratio  Ceruloplasmin     Status: None   Collection Time: 08/31/17 11:25 AM  Result Value Ref Range   Ceruloplasmin 20.1 16.0 - 31.0 mg/dL  Ferritin     Status: None   Collection Time: 08/31/17 11:25 AM  Result Value Ref Range   Ferritin 135 30 - 400 ng/mL  Hepatitis B surface antigen     Status: None   Collection Time: 08/31/17 11:25 AM  Result Value Ref Range   Hepatitis B Surface Ag Negative Negative  Hepatitis B core antibody, total     Status: None   Collection Time: 08/31/17 11:25 AM  Result Value Ref Range   Hep B Core Total Ab Negative Negative  Hepatitis A antibody, total     Status: None   Collection Time: 08/31/17 11:25 AM  Result Value Ref Range   Hep A Total Ab Negative Negative  HCV Comment:     Status: None   Collection Time: 08/31/17 11:25 AM  Result Value Ref Range   Comment: Comment     Comment: Non reactive HCV antibody screen is consistent with no HCV infection, unless recent infection is suspected or other evidence exists to indicate HCV infection.   Vitamin B12     Status: None   Collection Time:  09/22/17  2:53 PM  Result Value Ref Range   Vitamin B-12 186 180 - 914 pg/mL    Comment: (NOTE) This assay is not validated for testing neonatal or myeloproliferative syndrome specimens for Vitamin B12 levels. Performed at [facility], [place] [address] [place], [place] [address]   Folate     Status: None   Collection Time: 09/22/17  2:53 PM  Result Value Ref Range   Folate 19.5 >5.9 ng/mL    Comment: Performed at [facility], [address] [place], [place] [address]  Technologist smear review     Status: None   Collection Time: 09/22/17  2:53 PM  Result Value Ref Range   Tech Review PLATELETS APPEAR DECREASED     Comment: PLATELETS VARY IN SIZE RBC MORPHOLOGY NORMAL WBC MORPHOLOGY UNREMARKABLE Performed at [facility], [address] [place],  [address]   CBC with Differential     Status: Abnormal   Collection Time: 09/22/17  2:53 PM  Result Value Ref Range   WBC 7.9 3.8 - 10.6 K/uL   RBC 4.43 4.40 - 5.90 MIL/uL   Hemoglobin 14.7 13.0 - 18.0 g/dL   HCT 41.5 40.0 - 52.0 %   MCV 93.8 80.0 - 100.0 fL   MCH 33.2 26.0 - 34.0 pg   MCHC 35.4 32.0 - 36.0 g/dL  RDW 12.5 11.5 - 14.5 %   Platelets 113 (L) 150 - 440 K/uL   Neutrophils Relative % 70 %   Neutro Abs 5.5 1.4 - 6.5 K/uL   Lymphocytes Relative 18 %   Lymphs Abs 1.4 1.0 - 3.6 K/uL   Monocytes Relative 8 %   Monocytes Absolute 0.6 0.2 - 1.0 K/uL   Eosinophils Relative 3 %   Eosinophils Absolute 0.2 0 - 0.7 K/uL   Basophils Relative 1 %   Basophils Absolute 0.1 0 - 0.1 K/uL    Comment: Performed at [facility], [address] [place], [place] [address]   Objective  Body mass index is 27.22 kg/m. Wt Readings from Last 3 Encounters:  10/05/17 212 lb (96.2 kg)  09/22/17 209 lb (94.8 kg)  08/30/17 216 lb 3.2 oz (98.1 kg)   Temp Readings from Last 3 Encounters:  10/05/17 98.4 F (36.9 C) (Oral)  09/22/17 (!) 97.2 F (36.2 C) (Tympanic)  08/18/17 (!) 97.5 F (36.4 C)  (Oral)   BP Readings from Last 3 Encounters:  10/05/17 140/62  09/22/17 127/72  08/30/17 (!) 153/73   Pulse Readings from Last 3 Encounters:  10/05/17 62  09/22/17 61  08/30/17 65    Physical Exam  Constitutional: He is oriented to person, place, and time. Vital signs are normal. He appears well-developed and well-nourished. He is cooperative.  HENT:  Head: Normocephalic and atraumatic.  Mouth/Throat: Oropharynx is clear and moist and mucous membranes are normal.  Eyes: Pupils are equal, round, and reactive to light. Conjunctivae are normal.  Cardiovascular: Normal rate and regular rhythm.  Pulmonary/Chest: Effort normal and breath sounds normal.  Neurological: He is alert and oriented to person, place, and time.  Skin: Skin is warm and dry.     Psychiatric: He has a normal mood and affect. His speech is normal and behavior is normal. Judgment and thought content normal. Cognition and memory are normal.  Nursing note and vitals reviewed.   Assessment   1. Left leg blister 2 cm in length with surrounding erythema c/w cellulitis  Plan   1. Cleaned with betadine drained with sterile need with deflation and clear contents drained no complications  Doxy bid x 7-10 days  bactroban bid x 1 week  Call if not better    Provider: Dr. Olivia Mackie McLean-Scocuzza-Internal Medicine

## 2017-10-05 NOTE — Patient Instructions (Addendum)
Take antibiotic 2x per day with food x 7-10 days  Call use back if not better  [facility]   Blisters, Adult A blister is a raised bubble of skin filled with liquid. Blisters often develop in an area of the skin that repeatedly rubs or presses against another surface (friction blister). Friction blisters can occur on any part of the body, but they usually develop on the hands or feet. Long-term pressure on the same area of the skin can also lead to areas of hardened skin (calluses). What are the causes? A blister can be caused by:  An injury.  A burn.  An allergic reaction.  An infection.  Exposure to irritating chemicals.  Friction, especially in an area with a lot of heat and moisture.  Friction blisters often result from:  Sports.  Repetitive activities.  Using tools and doing other activities without wearing gloves.  Shoes that are too tight or too loose.  What are the signs or symptoms? A blister is often round and looks like a bump. It may:  Itch.  Be painful to the touch.  Before a blister forms, the skin may:  Become red.  Feel warm.  Itch.  Be painful to the touch.  How is this diagnosed? A blister is diagnosed with a physical exam. How is this treated? Treatment usually involves protecting the area where the blister has formed until the skin has healed. Other treatments may include:  A bandage (dressing) to cover the blister.  Extra padding around and over the blister, so that it does not rub on anything.  Antibiotic ointment.  Most blisters break open, dry up, and go away on their own within 1-2 weeks. Blisters that are very painful may be drained before they break open on their own. If the blister is large or painful, it can be drained by: 1. Sterilizing a small needle with rubbing alcohol. 2. Washing your hands with soap and water. 3. Inserting the needle in the edge of the blister to make a small hole. Some fluid will drain out of the  hole. Let the top or roof of the blister stay in place. This helps the skin heal. 4. Washing the blister with mild soap and water. 5. Covering the blister with antibiotic ointment, if prescribed by your health care provider, and a dressing.  Some blisters may need to be drained by a health care provider. Follow these instructions at home:  Protect the area where the blister has formed as told by your health care provider.  Keep your blister clean and dry. This helps to prevent infection.  Do not pop your blister. This can cause infection.  If you were prescribed an antibiotic, use it as told by your health care provider. Do not stop using the antibiotic even if your condition improves.  Wear different shoes until the blister heals.  Avoid the activity that caused the blister until your blister heals.  Check your blister every day for signs of infection. Check for: ? More redness, swelling, or pain. ? More fluid or blood. ? Warmth. ? Pus or a bad smell. ? The blister getting better and then getting worse. How is this prevented? Taking these steps can help to prevent blisters that are caused by friction. Make sure you:  Wear comfortable shoes that fit well.  Always wear socks with shoes.  Wear extra socks or use tape, bandages, or pads over blister-prone areas as needed. You may also apply petroleum jelly under bandages in  blister-prone areas.  Wear protective gear, such as gloves, when participating in sports or activities that can cause blisters.  Wear loose-fitting, moisture-wicking clothes when participating in sports or activities.  Use powders as needed to keep your feet dry.  Contact a health care provider if:  You have more redness, swelling, or pain around your blister.  You have more fluid or blood coming from your blister.  Your blister feels warm to the touch.  You have pus or a bad smell coming from your blister.  You have a fever or chills.  Your  blister gets better and then it gets worse. This information is not intended to replace advice given to you by your health care provider. Make sure you discuss any questions you have with your health care provider. Document Released: 03/31/2004 Document Revised: 10/21/2015 Document Reviewed: 09/04/2015 Elsevier Interactive Patient Education  2018 [employer].

## 2017-10-12 DIAGNOSIS — D485 Neoplasm of uncertain behavior of skin: Secondary | ICD-10-CM | POA: Diagnosis not present

## 2017-10-12 DIAGNOSIS — L820 Inflamed seborrheic keratosis: Secondary | ICD-10-CM | POA: Diagnosis not present

## 2017-10-25 DIAGNOSIS — Z794 Long term (current) use of insulin: Secondary | ICD-10-CM | POA: Diagnosis not present

## 2017-10-25 DIAGNOSIS — E119 Type 2 diabetes mellitus without complications: Secondary | ICD-10-CM | POA: Diagnosis not present

## 2017-10-25 DIAGNOSIS — E118 Type 2 diabetes mellitus with unspecified complications: Secondary | ICD-10-CM | POA: Diagnosis not present

## 2017-11-30 ENCOUNTER — Ambulatory Visit: Payer: Medicare Other | Admitting: Gastroenterology

## 2017-12-01 ENCOUNTER — Ambulatory Visit: Payer: Medicare Other | Admitting: Family Medicine

## 2017-12-19 ENCOUNTER — Telehealth: Payer: Self-pay

## 2017-12-19 NOTE — Telephone Encounter (Signed)
Not my patient forwarded to PCP

## 2017-12-19 NOTE — Telephone Encounter (Signed)
Copied from [place] [id number]. Topic: General - Other >> Dec 19, 2017  4:05 PM Janace Aris A wrote: Reason for CRM: [facility] called in, saying the patient is requesting to have the medication traMADol (ULTRAM) 50 MG tablet filled. It shows the medication has been discontinued for a completed course.    Can we still fill this RX for the patient.?  Please advise

## 2017-12-20 NOTE — Telephone Encounter (Signed)
It appears this has been filled previously for his arthritis.  Please follow-up with the patient and see if he has been taking this intermittently or if there is a new reason that he is requesting a refill.

## 2017-12-21 DIAGNOSIS — L821 Other seborrheic keratosis: Secondary | ICD-10-CM | POA: Diagnosis not present

## 2017-12-21 DIAGNOSIS — D692 Other nonthrombocytopenic purpura: Secondary | ICD-10-CM | POA: Diagnosis not present

## 2017-12-21 DIAGNOSIS — D1801 Hemangioma of skin and subcutaneous tissue: Secondary | ICD-10-CM | POA: Diagnosis not present

## 2017-12-21 DIAGNOSIS — Z85828 Personal history of other malignant neoplasm of skin: Secondary | ICD-10-CM | POA: Diagnosis not present

## 2017-12-21 DIAGNOSIS — D225 Melanocytic nevi of trunk: Secondary | ICD-10-CM | POA: Diagnosis not present

## 2017-12-21 NOTE — Telephone Encounter (Signed)
Called and spoke with pt. He stated that he does take this medication for his AR. But he also takes it for some pain. Pt stated that he has many test does for the pain that starts at the middle of his chest and radiates under his left breast to his back. Pt stated that it feels like sharp bee stings and it hurts really bad. Pt doesn't care to take pain medication but he will use the tramadol for this pain as well.   Pt stated that if needed he is happy to make an OV with Dr. [name].

## 2017-12-24 NOTE — Telephone Encounter (Signed)
Noted. I would like for him to follow-up for this medication. Also, please confirm that [place] stands for acid reflux. Or does it mean something else. Thanks.

## 2017-12-25 ENCOUNTER — Ambulatory Visit: Payer: Self-pay | Admitting: *Deleted

## 2017-12-25 NOTE — Telephone Encounter (Signed)
Pt called with complaints of shingles vacine in right arm on 11/14/17 at [facility] [place], [place]; within 2 weeks he had burning sensation/fire poker under left breast to center of his back; he says that tylenol does not help; the pt says that he does not have a rash,but his left breast is larger than the right; his pharmacist told said that he may be having a reaction to the vaccine; he says that the pain is nagging and does not go away; he says that this happened when he got shingles vaccine in 2017; recommendations made per nurse triage protocol; he normally sees Dr [name] but he has no availability within the pt's scheduling constraints but he would like to see Dr Karlyn Agee- Scocuzza instead; pt offered and accepted appointment with Dr Terese Door, [place], 12-26-17 at [phone]; he verbalizes understanding; will route to office for notification of this upcoming appointment.    Reason for Disposition . [1] MODERATE pain (e.g., interferes with normal activities) AND [2] present > 3 days  Answer Assessment - Initial Assessment Questions 1. ONSET: "When did the pain start?"     11/21/17 2. LOCATION: "Where is the pain located?"     Under left breast to center  Of back 3. PAIN: "How bad is the pain?" (Scale 1-10; or mild, moderate, severe)   - MILD (1-3): doesn't interfere with normal activities   - MODERATE (4-7): interferes with normal activities (e.g., work or school) or awakens from sleep   - SEVERE (8-10): excruciating pain, unable to do any normal activities, unable to hold a cup of water     moderate 4. WORK OR EXERCISE: "Has there been any recent work or exercise that involved this part of the body?"     no 5. CAUSE: "What do you think is causing the arm pain?"     ? Shingles vacine 6. OTHER SYMPTOMS: "Do you have any other symptoms?" (e.g., neck pain, swelling, rash, fever, numbness, weakness)     no 7. PREGNANCY: "Is there any chance you are pregnant?" "When was  your last menstrual period?"     n/a  Protocols used: ARM PAIN-A-AH

## 2017-12-25 NOTE — Telephone Encounter (Signed)
FYI

## 2017-12-26 ENCOUNTER — Encounter: Payer: Self-pay | Admitting: Internal Medicine

## 2017-12-26 ENCOUNTER — Ambulatory Visit (INDEPENDENT_AMBULATORY_CARE_PROVIDER_SITE_OTHER): Payer: Medicare Other | Admitting: Internal Medicine

## 2017-12-26 ENCOUNTER — Inpatient Hospital Stay: Payer: Medicare Other | Attending: Oncology

## 2017-12-26 VITALS — BP 132/86 | HR 58 | Temp 98.4°F | Ht 74.0 in | Wt 220.0 lb

## 2017-12-26 DIAGNOSIS — D696 Thrombocytopenia, unspecified: Secondary | ICD-10-CM | POA: Diagnosis not present

## 2017-12-26 DIAGNOSIS — N62 Hypertrophy of breast: Secondary | ICD-10-CM

## 2017-12-26 DIAGNOSIS — M1990 Unspecified osteoarthritis, unspecified site: Secondary | ICD-10-CM | POA: Diagnosis not present

## 2017-12-26 DIAGNOSIS — E538 Deficiency of other specified B group vitamins: Secondary | ICD-10-CM | POA: Insufficient documentation

## 2017-12-26 DIAGNOSIS — Z1329 Encounter for screening for other suspected endocrine disorder: Secondary | ICD-10-CM

## 2017-12-26 DIAGNOSIS — I25709 Atherosclerosis of coronary artery bypass graft(s), unspecified, with unspecified angina pectoris: Secondary | ICD-10-CM | POA: Diagnosis not present

## 2017-12-26 DIAGNOSIS — R1013 Epigastric pain: Secondary | ICD-10-CM

## 2017-12-26 DIAGNOSIS — R162 Hepatomegaly with splenomegaly, not elsewhere classified: Secondary | ICD-10-CM | POA: Diagnosis not present

## 2017-12-26 DIAGNOSIS — E119 Type 2 diabetes mellitus without complications: Secondary | ICD-10-CM

## 2017-12-26 DIAGNOSIS — N401 Enlarged prostate with lower urinary tract symptoms: Secondary | ICD-10-CM

## 2017-12-26 DIAGNOSIS — M792 Neuralgia and neuritis, unspecified: Secondary | ICD-10-CM

## 2017-12-26 LAB — CBC
HCT: 36.8 % — ABNORMAL LOW (ref 39.0–52.0)
Hemoglobin: 13.2 g/dL (ref 13.0–17.0)
MCH: 32.2 pg (ref 26.0–34.0)
MCHC: 35.9 g/dL (ref 30.0–36.0)
MCV: 89.8 fL (ref 80.0–100.0)
NRBC: 0 % (ref 0.0–0.2)
PLATELETS: 106 10*3/uL — AB (ref 150–400)
RBC: 4.1 MIL/uL — ABNORMAL LOW (ref 4.22–5.81)
RDW: 12.6 % (ref 11.5–15.5)
WBC: 5.8 10*3/uL (ref 4.0–10.5)

## 2017-12-26 LAB — VITAMIN B12: Vitamin B-12: 505 pg/mL (ref 180–914)

## 2017-12-26 MED ORDER — GABAPENTIN 300 MG PO CAPS: 300.0000 mg | ORAL_CAPSULE | Freq: Every day | ORAL | 11 refills | Status: DC

## 2017-12-26 MED ORDER — VALACYCLOVIR HCL 1 G PO TABS: 1000.0000 mg | ORAL_TABLET | Freq: Two times a day (BID) | ORAL | 0 refills | Status: DC

## 2017-12-26 MED ORDER — TRAMADOL HCL 50 MG PO TABS: 50.0000 mg | ORAL_TABLET | Freq: Two times a day (BID) | ORAL | 0 refills | Status: DC | PRN

## 2017-12-26 NOTE — Telephone Encounter (Signed)
Noted.  We will see how his visit goes with Dr. Terese Door prior to refilling.

## 2017-12-26 NOTE — Telephone Encounter (Signed)
Sent to PCP as an FYI  

## 2017-12-26 NOTE — Progress Notes (Signed)
Pre visit review using our clinic review tool, if applicable. No additional management support is needed unless otherwise documented below in the visit note. 

## 2017-12-26 NOTE — Progress Notes (Signed)
Chief Complaint  Patient presents with  . Chest Pain    possible shingles.   F/u with wife  1. C/o burning sensation left upper abdomen, epigastric, left lower chest region which wraps around flank since shingrix vaccine 11/13/17 he reports h/o similar sx's with old shingles vaccine in 2017 though per chart review appears he had in 2012. Sensation is burning, hot, stinging not relieved by Tylenol. He wants to know if this could be shingles but no blistering rash noted since 11/2017. This is keeping him up at night  2. H/o CAD s/p CABG x 4 and h/o indeterminate NM stress test. He c/o worsening sob with exertion with walking short distances, night sweating and #1. He has appt 01/2018 with Dr. Rockey Situ cardiologist but given history I deem appt needs to be moved up will try  3. DM 2 08/18/17 A1C 7.7 on Toujeo per PCP  4. H/o BPH he does see a urologist on proscar 5 mg 5. He reports left breast is larger than right disc with he and wife about considering mammogram in future can disc with PCP  6. Low plts and h/o HSM. Hep panel A, B, C negative in the past  7. Arthritis wants refill of tramadol need appt with PCP to refill disc only can do 5 day supply and PCP will have to fill if deemed needed in future    Review of Systems  Constitutional: Positive for malaise/fatigue. Negative for weight loss.       +night sweats   HENT: Negative for hearing loss.   Eyes: Negative for blurred vision.  Respiratory: Positive for shortness of breath.   Cardiovascular: Positive for chest pain.  Gastrointestinal: Positive for abdominal pain. Negative for nausea and vomiting.  Musculoskeletal: Negative for falls.  Skin: Negative for rash.  Neurological: Positive for sensory change.  Psychiatric/Behavioral: Negative for depression.   Past Medical History:  Diagnosis Date  . BPH (benign prostatic hyperplasia)   . Cancer ([place])    skin  . Cataract   . Coronary artery disease    a. 4v CABG 01/2005; b. cath 06/05/13  showed patent grafts: LIMA-LAD, VG-D, VG-OM3, VG-dRCA, nl filling pressures, nl LV fxn  . Diastolic dysfunction    a. echo 07/4096: nl systolic fxn, mild LVH, diastolic relaxation abnormality, mildly enlarged LA, mild Ao insufficiency  . Dyspnea    with exertion  . ED (erectile dysfunction)   . GERD (gastroesophageal reflux disease)   . Hyperlipidemia   . Hypertension   . IDDM (insulin dependent diabetes mellitus) ([place]) 2000  . Osteoarthritis   . Perirectal fistula   . Pneumonia 12/2016  . Tubular adenoma of colon 07/2012  . Vertigo    Past Surgical History:  Procedure Laterality Date  . BACK SURGERY    . CARDIAC CATHETERIZATION  06/05/2013   [facility]   Marland Kitchen CARDIAC CATHETERIZATION N/A 09/24/2015   Procedure: Right Heart Cath and Coronary/Graft Angiography;  Surgeon: Minna Merritts, MD;  Location: [facility];  Service: Cardiovascular;  Laterality: N/A;  . CATARACT EXTRACTION  sept 2013   right  . CATARACT EXTRACTION W/PHACO Left 03/28/2017   Procedure: CATARACT EXTRACTION PHACO AND INTRAOCULAR LENS PLACEMENT (IOC);  Surgeon: Birder Robson, MD;  Location: [facility];  Service: Ophthalmology;  Laterality: Left;  [place] 00:42.0 AP% 15.0 CDE 6.31 Fluid Pack lot # [id number]  . CHOLECYSTECTOMY  05/15/2011   Procedure: LAPAROSCOPIC CHOLECYSTECTOMY;  Surgeon: Rolm Bookbinder, MD;  Location: [facility];  Service: General;  Laterality: N/A;  .  COLONOSCOPY W/ POLYPECTOMY    . CORONARY ARTERY BYPASS GRAFT  2007   x 4  . FOOT SURGERY  2012   right foot  . JOINT REPLACEMENT  8/10   Right THR--[name]  . LEFT AND RIGHT HEART CATHETERIZATION WITH CORONARY/GRAFT ANGIOGRAM N/A 06/05/2013   Procedure: LEFT AND RIGHT HEART CATHETERIZATION WITH Beatrix Fetters;  Surgeon: Peter M Martinique, MD;  Location: [facility] CATH LAB;  Service: Cardiovascular;  Laterality: N/A;  . LUMBAR LAMINECTOMY  1989  . Prostate photovaporization  5/16   Dr Budd Palmer   Family History  Problem Relation Age of Onset   . Lung cancer Mother   . Heart disease Father   . Colon cancer Neg Hx    Social History   Socioeconomic History  . Marital status: Married    Spouse name: Not on file  . Number of children: 4  . Years of education: Not on file  . Highest education level: Not on file  Occupational History  . 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 Financial resource strain: Not on file  . Food insecurity:    Worry: Not on file    Inability: Not on file  . Transportation needs:    Medical: Not on file    Non-medical: Not on file  Tobacco Use  . Smoking status: Never Smoker  . Smokeless tobacco: Never Used  Substance and Sexual Activity  . Alcohol use: Yes    Comment: occ cocktail  . Drug use: No  . Sexual activity: Not on file  Lifestyle  . Physical activity:    Days per week: Not on file    Minutes per session: Not on file  . Stress: Not on file  Relationships  . Social connections:    Talks on phone: Not on file    Gets together: Not on file    Attends religious service: Not on file    Active member of club or organization: Not on file    Attends meetings of clubs or organizations: Not on file    Relationship status: Not on file  . Intimate partner violence:    Fear of current or ex partner: Not on file    Emotionally abused: Not on file    Physically abused: Not on file    Forced sexual activity: Not on file  Other Topics Concern  . Not on file  Social History Narrative   Goes by [name]   Has living will    Has designated [name] as health care POA   Would accept resuscitation attempts   Would not want tube feeds if cognitively unaware   Current Meds  Medication Sig  . acetaminophen (TYLENOL) 500 MG tablet Take 1,000 mg by mouth 2 (two) times daily.  Marland Kitchen aspirin 81 MG tablet Take 81 mg by mouth daily.   . BD INSULIN SYRINGE ULTRAFINE 31G X 15/64" 0.3 ML MISC USE AS DIRECTED  . blood glucose meter kit and  supplies KIT OneTouch Verio w/ Device Kit OneTouch Verio VI STRP OneTouch Delica Lancet Dev misc OneTouch Delica Lancets 36O or Fine  Please check CBGs fasting and 2 other times during the day  Dx Code E11.8  . carvedilol (COREG) 12.5 MG tablet Take 1 tablet (12.5 mg total) by mouth 2 (two) times daily.  Marland Kitchen doxycycline (VIBRA-TABS) 100 MG tablet Take 1 tablet (100 mg total) by mouth 2 (two) times daily. With food x 7-10 days  . finasteride (PROSCAR)  5 MG tablet Take 1 tablet (5 mg total) by mouth daily. (Patient taking differently: Take 5 mg by mouth at bedtime. )  . furosemide (LASIX) 20 MG tablet TAKE 1 TABLET BY MOUTH TWICE DAILY AS NEEDED (Patient taking differently: TAKE 1 TABLET BY MOUTH DAILY AS NEEDED)  . Insulin Pen Needle (BD PEN NEEDLE NANO U/F) 32G X 4 MM MISC USE AS DIRECTED  . mupirocin ointment (BACTROBAN) 2 % Apply 1 application topically 2 (two) times daily. X 1 week with Qtip  . potassium chloride (K-DUR) 10 MEQ tablet TAKE 1 TABLET DAILY  . ramipril (ALTACE) 10 MG capsule Take 1 capsule (10 mg total) by mouth daily.  . rosuvastatin (CRESTOR) 20 MG tablet Take 1 tablet (20 mg total) by mouth daily. (Patient taking differently: Take 20 mg every other day by mouth. )  . TOUJEO SOLOSTAR 300 UNIT/ML SOPN INJECT 50-60 UNITS INTO THE SKIN DAILY .INCREASE BY 1 UNITS/DAY UNTILFBS ARE BETWEEN 80-130. DO NOT EXCEED 60 UNITS PER DAY.  . traZODone (DESYREL) 50 MG tablet Take 1-2 tablets (50-100 mg total) by mouth at bedtime as needed for sleep.  . Vitamin D, Cholecalciferol, 1000 units TABS Take 1,000 Units by mouth daily.    Allergies  Allergen Reactions  . Cortisone Other (See Comments)    Leg Cramps  . Metformin And Related Diarrhea   Recent Results (from the past 2160 hour(s))  CBC     Status: Abnormal   Collection Time: 12/26/17  2:22 PM  Result Value Ref Range   WBC 5.8 4.0 - 10.5 K/uL   RBC 4.10 (L) 4.22 - 5.81 MIL/uL   Hemoglobin 13.2 13.0 - 17.0 g/dL   HCT 36.8 (L)  39.0 - 52.0 %   MCV 89.8 80.0 - 100.0 fL   MCH 32.2 26.0 - 34.0 pg   MCHC 35.9 30.0 - 36.0 g/dL   RDW 12.6 11.5 - 15.5 %   Platelets 106 (L) 150 - 400 K/uL    Comment: REPEATED TO VERIFY SPECIMEN CHECKED FOR CLOTS    nRBC 0.0 0.0 - 0.2 %    Comment: Performed at [facility], [address] [place], [place] [address]   Objective  Body mass index is 28.25 kg/m. Wt Readings from Last 3 Encounters:  12/26/17 220 lb (99.8 kg)  10/05/17 212 lb (96.2 kg)  09/22/17 209 lb (94.8 kg)   Temp Readings from Last 3 Encounters:  12/26/17 98.4 F (36.9 C) (Oral)  10/05/17 98.4 F (36.9 C) (Oral)  09/22/17 (!) 97.2 F (36.2 C) (Tympanic)   BP Readings from Last 3 Encounters:  12/26/17 132/86  10/05/17 140/62  09/22/17 127/72   Pulse Readings from Last 3 Encounters:  12/26/17 (!) 58  10/05/17 62  09/22/17 61    Physical Exam  Constitutional: He is oriented to person, place, and time. Vital signs are normal. He appears well-developed and well-nourished. He is cooperative.  HENT:  Head: Normocephalic and atraumatic.  Mouth/Throat: Oropharynx is clear and moist and mucous membranes are normal.  Eyes: Pupils are equal, round, and reactive to light. Conjunctivae are normal.  Cardiovascular: Normal rate, regular rhythm and normal heart sounds.  Pulmonary/Chest: Effort normal and breath sounds normal.  Abdominal:    Neurological: He is alert and oriented to person, place, and time. Gait normal.  Skin: Skin is warm, dry and intact.  Psychiatric: He has a normal mood and affect. His speech is normal and behavior is normal. Judgment and thought content normal. Cognition and  memory are normal.  Nursing note and vitals reviewed.   Assessment   1. Epigastric and left chest pain with sob with exertion and sweating qhs ddx stable angina, less likely side effect of shingrix vaccine reviewed UTD and this is not side effect  2. CAD s/p CABG x 4 with worsening sob with exertion  3. DM  2 A1C 7.7  4. BPH with sxs  5. Left breast gynecomastia  6. Thrombocytopenia with HSM 7. HM 8. Arthritis Plan   1.  Check labs in am Will try to get in with Dr. Rockey Situ sooner  We can try a trial of gabapentin 300 mg qhs and valtrex 1 mg bid to see if this helps burning sensation but advised he needs cardiac w/u I.e echo and possibly cath with cardiologist  2.  See above  3.  Cont toujeo  Check A1C, urine and urine protein  4.  Check PSA Cont meds and f/u urology 5.  Consider left breast mammo Check testosterone levels  6.  Repeat US abdomen and consider CT ab/pelvis in future  F/u with GI 7.  utd vaccine except due pna 23 and shingrix 2nd dose  Other HM f/u with PCP  8. Tramadol 50 mg bid prn #10  Provider: Dr. Olivia Mackie McLean-Scocuzza-Internal Medicine

## 2017-12-26 NOTE — Telephone Encounter (Signed)
AR stands for arthritis   Pt was unable to get an apt with dr. [name] but is scheduled to see Dr. Aundra Dubin today for his symptoms and will need the tramadol refilled for the pain his is feeling from the burning/ sting like pains. Pt believes that he might have shingles.   Pt is scheduled for an OV with PCP in 03/05/2018

## 2017-12-26 NOTE — Patient Instructions (Addendum)
Rec sooner appt with Dr. Rockey Situ Try to call to get moved up  We will order abdominal US  Glucosamine/chrondrotin and tumeric for joints   Thrombocytopenia Thrombocytopenia is a condition in which you have an abnormally decreased number of platelets in your blood. Platelets are also called thrombocytes. Platelets are needed for blood clotting. Some cases of thrombocytopenia are mild while others are more severe. What are the causes? This condition may be caused by:  Decreased production of platelets. This can be caused by: ? Aplastic anemia, in which your bone marrow quits making blood cells. ? Cancer in the bone marrow. ? Use of certain medicines, including chemotherapy. ? Infection in the bone marrow. ? Heavy alcohol consumption.  Increased destruction of platelets. This can be caused by: ? Certain immune diseases. ? Use of certain drugs. ? Certain blood clotting disorders. ? Certain inherited disorders. ? Certain bleeding disorders. ? Pregnancy.  Having an enlarged spleen (hypersplenism). In hypersplenism, the spleen gathers up platelets from circulation. This means that the platelets are not available to help with blood clotting. The spleen can be enlarged because of cirrhosis or other conditions.  What are the signs or symptoms? Symptoms of this condition are side effects of poor blood clotting. They will vary depending on how low the platelet counts are. Symptoms may include:  Abnormal bleeding.  Nosebleeds.  Heavy menstrual periods.  Blood in the urine or stool (feces).  A purplish discoloration in the skin (purpura).  Bruising.  A rash that looks like pinpoint, purplish-red spots (petechiae) on the skin and mucous membranes.  How is this diagnosed? This condition may be diagnosed with blood tests and a physical exam. Sometimes, a sample of bone marrow may be removed to look for the original cells (megakaryocytes) that make platelets. How is this treated? Treatment  for this condition depends on the cause. Treatment options may include:  Treatment of another condition that is causing the low platelet count.  Medicines to help protect your platelets from being destroyed.  A replacement (transfusion) of platelets to stop or prevent bleeding.  Surgery to remove the spleen.  Follow these instructions at home: General instructions  Check your skin and the linings inside your mouth for bruising or bleeding as told by your health care provider.  Check your sputum, urine, and stool for blood as told by your health care provider.  Ask your health care provider if it is okay for you to drink alcohol.  Take over-the-counter and prescription medicines only as told by your health care provider.  Tell all of your health care providers, including dentists and eye doctors, about your condition. Activity  Until your health care provider says it is okay. ? Do not return to any activities that could cause bumps or bruises.  Take extra care not to cut yourself when you shave or when you use scissors, needles, knives, and other tools.  Take extra care not to burn yourself when ironing or cooking. Contact a health care provider if:  You have unexplained bruising. Get help right away if:  You have active bleeding from anywhere on your body.  You have blood in your sputum, urine, or stool. This information is not intended to replace advice given to you by your health care provider. Make sure you discuss any questions you have with your health care provider. Document Released: 02/21/2005 Document Revised: 10/25/2015 Document Reviewed: 08/25/2014 Elsevier Interactive Patient Education  2018 [employer].

## 2017-12-27 ENCOUNTER — Encounter: Payer: Self-pay | Admitting: Internal Medicine

## 2017-12-27 ENCOUNTER — Encounter: Payer: Self-pay | Admitting: Gastroenterology

## 2017-12-27 ENCOUNTER — Ambulatory Visit: Payer: Medicare Other | Admitting: Gastroenterology

## 2017-12-27 ENCOUNTER — Other Ambulatory Visit (INDEPENDENT_AMBULATORY_CARE_PROVIDER_SITE_OTHER): Payer: Medicare Other

## 2017-12-27 ENCOUNTER — Telehealth: Payer: Self-pay | Admitting: Internal Medicine

## 2017-12-27 VITALS — BP 143/65 | HR 61 | Ht 74.0 in | Wt 217.2 lb

## 2017-12-27 DIAGNOSIS — N62 Hypertrophy of breast: Secondary | ICD-10-CM

## 2017-12-27 DIAGNOSIS — R748 Abnormal levels of other serum enzymes: Secondary | ICD-10-CM

## 2017-12-27 DIAGNOSIS — E119 Type 2 diabetes mellitus without complications: Secondary | ICD-10-CM

## 2017-12-27 DIAGNOSIS — N401 Enlarged prostate with lower urinary tract symptoms: Secondary | ICD-10-CM

## 2017-12-27 DIAGNOSIS — Z1329 Encounter for screening for other suspected endocrine disorder: Secondary | ICD-10-CM | POA: Diagnosis not present

## 2017-12-27 DIAGNOSIS — R1013 Epigastric pain: Secondary | ICD-10-CM | POA: Diagnosis not present

## 2017-12-27 LAB — COMPREHENSIVE METABOLIC PANEL
ALT: 12 U/L (ref 0–53)
AST: 15 U/L (ref 0–37)
Albumin: 4 g/dL (ref 3.5–5.2)
Alkaline Phosphatase: 106 U/L (ref 39–117)
BUN: 16 mg/dL (ref 6–23)
CO2: 26 mEq/L (ref 19–32)
Calcium: 9 mg/dL (ref 8.4–10.5)
Chloride: 107 mEq/L (ref 96–112)
Creatinine, Ser: 1.21 mg/dL (ref 0.40–1.50)
GFR: 61 mL/min (ref >60.00)
Glucose, Bld: 153 mg/dL — ABNORMAL HIGH (ref 70–99)
Potassium: 4 mEq/L (ref 3.5–5.1)
Sodium: 140 mEq/L (ref 135–145)
Total Bilirubin: 1.3 mg/dL — ABNORMAL HIGH (ref 0.2–1.2)
Total Protein: 6.1 g/dL (ref 6.0–8.3)

## 2017-12-27 LAB — PSA, MEDICARE: PSA: 1.6 ng/ml (ref 0.10–4.00)

## 2017-12-27 LAB — TROPONIN I: TNIDX: 0.01 ug/L (ref 0.00–0.06)

## 2017-12-27 LAB — HEMOGLOBIN A1C: Hgb A1c MFr Bld: 6.9 % — ABNORMAL HIGH (ref 4.6–6.5)

## 2017-12-27 LAB — TSH: TSH: 1.38 u[IU]/mL (ref 0.35–4.50)

## 2017-12-27 NOTE — Telephone Encounter (Signed)
Dr. Rockey Situ can you fit him in he only wants to see you worried about angina sx's given history with left chest, epigastric burning, sob with exertion given CAD, s/p CABG history saw 12/26/17   He only wants to see you  [address]

## 2017-12-27 NOTE — Progress Notes (Signed)
[name] Antigua, MD [address]  [place]  [place], [place] [address]  Main: [phone]  Fax: [phone]   Primary Care Physician: [name] Haven, MD  Primary Gastroenterologist:  Dr. Vonda [name]  Chief complaint: Follow-up for abnormal liver enzymes  HPI: [name] [name] is a 84 y.o. male here for follow-up of elevated alk phos.  Patient was seen in June 2019.  Alk phos was noted to be elevated to 120 in 2019.  Chronic elevation, with 155 in March 2018, 131 in December 2016.  Bilirubin and transaminases normal.  Viral and autoimmune hepatitis work-up was negative.  His GGT was noted to be mildly elevated to 52, with upper limit of normal being 51.  His ultrasound was reported to be normal, "liver: No focal lesion identified.  Within normal limits and parenchymal echogenicity".   His platelets were noted to be chronically low, 105 in June 2019.  FibroSure level was ordered to evaluate for cirrhosis but patient did not have this done.  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  Patient reports 2-4 drinks of alcohol a week which include a combination of wine or bourbon. Takes Tylenol twice a day for diffuse arthritis.  No other hepatotoxic drugs  screening colonoscopy done in May 2014, with tubular adenoma removed at the time, repeat recommended in 5 years  Current Outpatient Medications  Medication Sig Dispense Refill  . acetaminophen (TYLENOL) 500 MG tablet Take 1,000 mg by mouth 2 (two) times daily.    Marland Kitchen aspirin 81 MG tablet Take 81 mg by mouth daily.     . BD INSULIN SYRINGE ULTRAFINE 31G X 15/64" 0.3 ML MISC USE AS DIRECTED 100 each 3  . blood glucose meter kit and supplies KIT OneTouch Verio w/ Device Kit OneTouch Verio VI STRP OneTouch Delica Lancet Dev misc OneTouch Delica Lancets 33O or Fine  Please check CBGs fasting and 2 other times during the day  Dx Code E11.8 1 each 0  . carvedilol  (COREG) 12.5 MG tablet Take 1 tablet (12.5 mg total) by mouth 2 (two) times daily. 180 tablet 4  . doxycycline (VIBRA-TABS) 100 MG tablet Take 1 tablet (100 mg total) by mouth 2 (two) times daily. With food x 7-10 days 20 tablet 0  . finasteride (PROSCAR) 5 MG tablet Take 1 tablet (5 mg total) by mouth daily. (Patient taking differently: Take 5 mg by mouth at bedtime. ) 90 tablet 1  . furosemide (LASIX) 20 MG tablet TAKE 1 TABLET BY MOUTH TWICE DAILY AS NEEDED (Patient taking differently: TAKE 1 TABLET BY MOUTH DAILY AS NEEDED) 90 tablet 3  . gabapentin (NEURONTIN) 300 MG capsule Take 1 capsule (300 mg total) by mouth at bedtime. 30 capsule 11  . Insulin Pen Needle (BD PEN NEEDLE NANO U/F) 32G X 4 MM MISC USE AS DIRECTED 100 each 11  . mupirocin ointment (BACTROBAN) 2 % Apply 1 application topically 2 (two) times daily. X 1 week with Qtip 30 g 0  . potassium chloride (K-DUR) 10 MEQ tablet TAKE 1 TABLET DAILY 90 tablet 3  . ramipril (ALTACE) 10 MG capsule Take 1 capsule (10 mg total) by mouth daily. 90 capsule 4  . rosuvastatin (CRESTOR) 20 MG tablet Take 1 tablet (20 mg total) by mouth daily. (Patient taking differently: Take 20 mg every other day by mouth. ) 90 tablet 4  . TOUJEO SOLOSTAR 300 UNIT/ML SOPN INJECT 50-60 UNITS INTO THE SKIN  DAILY .INCREASE BY 1 UNITS/DAY UNTILFBS ARE BETWEEN 80-130. DO NOT EXCEED 60 UNITS PER DAY. 4.5 mL 6  . traMADol (ULTRAM) 50 MG tablet Take 1 tablet (50 mg total) by mouth 2 (two) times daily as needed. 10 tablet 0  . traZODone (DESYREL) 50 MG tablet Take 1-2 tablets (50-100 mg total) by mouth at bedtime as needed for sleep. 90 tablet 3  . valACYclovir (VALTREX) 1000 MG tablet Take 1 tablet (1,000 mg total) by mouth 2 (two) times daily. X 1 week with food 14 tablet 0  . Vitamin D, Cholecalciferol, 1000 units TABS Take 1,000 Units by mouth daily.      No current facility-administered medications for this visit.     Allergies as of 12/27/2017 - Review Complete  12/27/2017  Allergen Reaction Noted  . Cortisone Other (See Comments) 06/24/2016  . Metformin and related Diarrhea 09/05/2016    ROS:  General: Negative for anorexia, weight loss, fever, chills, fatigue, weakness. ENT: Negative for hoarseness, difficulty swallowing , nasal congestion. CV: Negative for chest pain, angina, palpitations, dyspnea on exertion, peripheral edema.  Respiratory: Negative for dyspnea at rest, dyspnea on exertion, cough, sputum, wheezing.  GI: See history of present illness. GU:  Negative for dysuria, hematuria, urinary incontinence, urinary frequency, nocturnal urination.  Endo: Negative for unusual weight change.    Physical Examination:   There were no vitals taken for this visit.  General: Well-nourished, well-developed in no acute distress.  Eyes: No icterus. Conjunctivae pink. Mouth: Oropharyngeal mucosa moist and pink , no lesions erythema or exudate. Neck: Supple, Trachea midline Abdomen: Bowel sounds are normal, nontender, nondistended, no hepatosplenomegaly or masses, no abdominal bruits or hernia , no rebound or guarding.   Extremities: No lower extremity edema. No clubbing or deformities. Neuro: Alert and oriented x 3.  Grossly intact. Skin: Warm and dry, no jaundice.   Psych: Alert and cooperative, normal mood and affect.   Labs: CMP     Component Value Date/Time   NA 141 08/31/2017 1125   NA 139 02/22/2013 1654   K 4.9 08/31/2017 1125   K 4.5 02/22/2013 1654   CL 106 08/31/2017 1125   CL 109 (H) 02/22/2013 1654   CO2 23 08/31/2017 1125   CO2 24 02/22/2013 1654   GLUCOSE 261 (H) 08/31/2017 1125   GLUCOSE 276 (H) 08/18/2017 1205   GLUCOSE 197 (H) 02/22/2013 1654   BUN 19 08/31/2017 1125   BUN 20 (H) 02/22/2013 1654   CREATININE 1.35 (H) 08/31/2017 1125   CREATININE 1.18 02/22/2013 1654   CREATININE 1.13 05/25/2012 1630   CALCIUM 9.1 08/31/2017 1125   CALCIUM 9.1 02/22/2013 1654   PROT 6.2 08/31/2017 1125   PROT 6.2 (L)  02/22/2013 1654   ALBUMIN 4.0 08/31/2017 1125   ALBUMIN 3.5 02/22/2013 1654   AST 18 08/31/2017 1125   AST 20 02/22/2013 1654   ALT 14 08/31/2017 1125   ALT 42 02/22/2013 1654   ALKPHOS 120 (H) 08/31/2017 1125   ALKPHOS 113 02/22/2013 1654   BILITOT 1.1 08/31/2017 1125   BILITOT 0.6 02/22/2013 1654   GFRNONAA 49 (L) 08/31/2017 1125   GFRNONAA 59 (L) 02/22/2013 1654   GFRAA 57 (L) 08/31/2017 1125   GFRAA >60 02/22/2013 1654   Lab Results  Component Value Date   WBC 5.8 12/26/2017   HGB 13.2 12/26/2017   HCT 36.8 (L) 12/26/2017   MCV 89.8 12/26/2017   PLT 106 (L) 12/26/2017    Imaging Studies: No results found.  Assessment  and Plan:   [name] [name] is a 84 y.o. y/o male here for follow-up of chronically elevated alk phos, with normal viral and autoimmune liver work-up  Patient GGT was only mildly elevated in June 2019 He has arthritis and his elevated alk phos may be bone in origin, will repeat CMP along with GGT We will also obtain FibroSure level to rule out cirrhosis as ultrasound reported a normal liver  Also his chronically low platelets are thought to be from ITP according to hematology, Dr. Janese Banks, and thus no convincing evidence of cirrhosis at this time, unless FibroSure level is abnormal.   Continue to avoid hepatotoxic drugs  Patient is due for polyp surveillance colonoscopy and this was discussed in detail, including risks and benefits of procedure at his age. He verbalized understanding of risks and benefits and would like to proceed with scheduling colonoscopy.  I have discussed alternative options, risks & benefits,  which include, but are not limited to, bleeding, infection, perforation,respiratory complication & drug reaction.  The patient agrees with this plan & written consent will be obtained.      Dr [name] [name]

## 2017-12-27 NOTE — Telephone Encounter (Signed)
Would put him on schedule Thursday afternoon 10/24

## 2017-12-27 NOTE — Telephone Encounter (Signed)
Nothing with Rockey Situ is available. His first available appt for an established pt is Dec. 3 per [facility].

## 2017-12-27 NOTE — Telephone Encounter (Signed)
Left patient detailed message per DPR. For labs before 10 am. Not twice to reach patient.

## 2017-12-27 NOTE — Telephone Encounter (Signed)
1. Please call pt to sch nonfasting labs today left too late yesterday urine and blood   2. Try to call Dr. Rockey Situ to move up appt from 01/2018 to this week if he can see him before Friday (he is going out of town) due to h/o CAD s/p CABG x 4 with complaints of epigastric left sided abdominal burning/stinging since 11/2017 and sweating at night and sob with exertion   I am concerned with angina given history and think he possibly needs a cath, and echo  Pt only wants to see Dr. Rockey Situ   Thanks [employer]

## 2017-12-27 NOTE — Addendum Note (Signed)
Addended by: Arby Barrette on: 12/27/2017 09:42 AM   Modules accepted: Orders

## 2017-12-28 ENCOUNTER — Ambulatory Visit: Payer: Medicare Other | Admitting: Cardiovascular Disease

## 2017-12-28 ENCOUNTER — Encounter: Payer: Self-pay | Admitting: Cardiovascular Disease

## 2017-12-28 ENCOUNTER — Other Ambulatory Visit: Payer: Self-pay | Admitting: Internal Medicine

## 2017-12-28 VITALS — BP 162/70 | HR 64 | Ht 74.0 in | Wt 220.2 lb

## 2017-12-28 DIAGNOSIS — I25118 Atherosclerotic heart disease of native coronary artery with other forms of angina pectoris: Secondary | ICD-10-CM

## 2017-12-28 DIAGNOSIS — E782 Mixed hyperlipidemia: Secondary | ICD-10-CM

## 2017-12-28 DIAGNOSIS — E118 Type 2 diabetes mellitus with unspecified complications: Secondary | ICD-10-CM | POA: Diagnosis not present

## 2017-12-28 DIAGNOSIS — R0602 Shortness of breath: Secondary | ICD-10-CM

## 2017-12-28 DIAGNOSIS — I5032 Chronic diastolic (congestive) heart failure: Secondary | ICD-10-CM

## 2017-12-28 DIAGNOSIS — I10 Essential (primary) hypertension: Secondary | ICD-10-CM | POA: Diagnosis not present

## 2017-12-28 DIAGNOSIS — I200 Unstable angina: Secondary | ICD-10-CM

## 2017-12-28 DIAGNOSIS — Z951 Presence of aortocoronary bypass graft: Secondary | ICD-10-CM

## 2017-12-28 LAB — URINALYSIS, ROUTINE W REFLEX MICROSCOPIC
BILIRUBIN UA: NEGATIVE
GLUCOSE, UA: NEGATIVE
Ketones, UA: NEGATIVE
Leukocytes, UA: NEGATIVE
Nitrite, UA: NEGATIVE
PH UA: 5 (ref 5.0–7.5)
RBC, UA: NEGATIVE
Specific Gravity, UA: 1.015 (ref 1.005–1.030)
Urobilinogen, Ur: 0.2 mg/dL (ref 0.2–1.0)

## 2017-12-28 LAB — MICROSCOPIC EXAMINATION: CASTS: NONE SEEN /LPF

## 2017-12-28 LAB — MICROALBUMIN / CREATININE URINE RATIO
Creatinine, Urine: 92.5 mg/dL
Microalb/Creat Ratio: 247.8 mg/g creat — ABNORMAL HIGH (ref 0.0–30.0)
Microalbumin, Urine: 229.2 ug/mL

## 2017-12-28 NOTE — Progress Notes (Signed)
Cardiology Office Note  Date:  12/28/2017   ID:  DEKENDRICK UZELAC, DOB [birth year], MRN [id number]  PCP:  [name], MD   Chief Complaint  Patient presents with  . other    follow up. SOB with exertion, and painful area in chest. Medications reviewed verbally.    HPI:  84 year old gentleman with a history of  coronary artery disease, bypass surgery in November 2006, Stress test March 2017, cath 09/2015, medical management Occluded proximal LAD  Critical/severe native RCA disease  Vein graft to the diagonal is occluded  Other vein grafts patent to the RCA/PDA, vein graft to the OM 3, LIMA graft  EF 55% in 12/2013 diabetes, numbers running high obesity,  hyperlipidemia,  hypertension,  erectile dysfunction Chronic SOB who presents for routine followup of his coronary artery disease.  Recently seen by GI for elevated alkaline phosphatase Chronically low platelets, to have ITP 2-4 drinks of alcohol a week Working with GI to rule out cirrhosis  Other symptoms include a burning sensation left upper abdomen, epigastric, left lower chest region wraps around flank Started since shingles vaccine September 2019 Previously with left stomach pain, nerve pain.   post herpetic   used Lidoderm patches.  neurology at [facility]  Chronic shortness of breath symptoms, deconditioning Again continues to have shortness of breath with exertion on walking, some night sweats Started on gabapentin, Valtrex twice daily for burning symptoms left chest Also has tramadol  Prior stress test March 2017 small fixed defect anteroseptal location, apical region  Having same unstable angina sx as before CABG, or stents Very SOB with minimal exertion, Walking around the house Has completes pulmonary w/u, was told his SOB was secondary to cardiac etiology per the patient  No regular exercise program, Weight continues to run high  History of Bell's palsy, Left side of face March 2019  2 days after  doing cataract surgery Completed prednisone and antivirals Improved sx, still difficulty smoking, right facial weakness  Takes crestor 20 every other day  Other past medical hx reviewed PFT mild to moderate obstruction  Chronic Joint pain, shoulder Leg cramps at night at times, worse with cortisone  Chronic hand, back pain  EKG personally reviewed by myself on todays visit sinus rhythm with rate 64 bpm no significant ST or T-wave changes  Other past medical history reviewed Previous echocardiogram in 2015 which was essentially normal  he denies any leg edema  CT scan of his abdomen reviewed showing mild diffuse aortic atherosclerosis Review of lab work showing elevated glucose levels but does report hemoglobin A1c 6.9  Seen in the past by specialist for his back in [place], had MRI showing arthritis no spinal stenosis  catheterization for chest pain 06/05/2013 that showed patent grafts with LIMA to the LAD, vein graft to the diagonal, vein graft to the OM 3, vein graft to the distal RCA. Normal filling pressures and normal LV function.  Echocardiogram April 2009 shows normal systolic function with mild LVH, diastolic relaxation abnormality, mildly enlarged left atrium, mild aortic insufficiency. Cardiac CT scan in March 2009 showed severe coronary artery disease with patent grafts x4, saphenous vein graft to the PDA, OM, diagonal and a LIMA graft to the LAD.   PMH:   has a past medical history of BPH (benign prostatic hyperplasia), Cancer ([place]), Cataract, Coronary artery disease, Diastolic dysfunction, Dyspnea, ED (erectile dysfunction), GERD (gastroesophageal reflux disease), Hyperlipidemia, Hypertension, IDDM (insulin dependent diabetes mellitus) ([place]) (2000), Osteoarthritis, Perirectal fistula, Pneumonia (12/2016), Tubular adenoma of colon (  07/2012), and Vertigo.  PSH:    Past Surgical History:  Procedure Laterality Date  . BACK SURGERY    . CARDIAC CATHETERIZATION   06/05/2013   [facility].   Marland Kitchen CARDIAC CATHETERIZATION N/A 09/24/2015   Procedure: Right Heart Cath and Coronary/Graft Angiography;  Surgeon: Minna Merritts, MD;  Location: [facility];  Service: Cardiovascular;  Laterality: N/A;  . CATARACT EXTRACTION  sept 2013   right  . CATARACT EXTRACTION W/PHACO Left 03/28/2017   Procedure: CATARACT EXTRACTION PHACO AND INTRAOCULAR LENS PLACEMENT (IOC);  Surgeon: Birder Robson, MD;  Location: [facility];  Service: Ophthalmology;  Laterality: Left;  [place] 00:42.0 AP% 15.0 CDE 6.31 Fluid Pack lot # [id number]  . CHOLECYSTECTOMY  05/15/2011   Procedure: LAPAROSCOPIC CHOLECYSTECTOMY;  Surgeon: Rolm Bookbinder, MD;  Location: [facility];  Service: General;  Laterality: N/A;  . COLONOSCOPY W/ POLYPECTOMY    . CORONARY ARTERY BYPASS GRAFT  2007   x 4  . FOOT SURGERY  2012   right foot  . JOINT REPLACEMENT  8/10   Right THR--[name]  . LEFT AND RIGHT HEART CATHETERIZATION WITH CORONARY/GRAFT ANGIOGRAM N/A 06/05/2013   Procedure: LEFT AND RIGHT HEART CATHETERIZATION WITH Beatrix Fetters;  Surgeon: Peter M Martinique, MD;  Location: [facility] C[facility];  Service: Cardiovascular;  Laterality: N/A;  . LUMBAR LAMINECTOMY  1989  . Prostate photovaporization  5/16   Dr Budd Palmer    Current Outpatient Medications  Medication Sig Dispense Refill  . acetaminophen (TYLENOL) 500 MG tablet Take 1,000 mg by mouth 2 (two) times daily.    Marland Kitchen aspirin 81 MG tablet Take 81 mg by mouth daily.     . BD INSULIN SYRINGE ULTRAFINE 31G X 15/64" 0.3 ML MISC USE AS DIRECTED 100 each 3  . blood glucose meter kit and supplies KIT OneTouch Verio w/ Device Kit OneTouch Verio VI STRP OneTouch Delica Lancet Dev misc OneTouch Delica Lancets 64P or Fine  Please check CBGs fasting and 2 other times during the day  Dx Code E11.8 1 each 0  . carvedilol (COREG) 12.5 MG tablet Take 1 tablet (12.5 mg total) by mouth 2 (two) times daily. 180 tablet 4  . finasteride (PROSCAR) 5 MG tablet  Take 1 tablet (5 mg total) by mouth daily. (Patient taking differently: Take 5 mg by mouth at bedtime. ) 90 tablet 1  . furosemide (LASIX) 20 MG tablet TAKE 1 TABLET BY MOUTH TWICE DAILY AS NEEDED (Patient taking differently: TAKE 1 TABLET BY MOUTH DAILY AS NEEDED) 90 tablet 3  . gabapentin (NEURONTIN) 300 MG capsule Take 1 capsule (300 mg total) by mouth at bedtime. 30 capsule 11  . Insulin Pen Needle (BD PEN NEEDLE NANO U/F) 32G X 4 MM MISC USE AS DIRECTED 100 each 11  . potassium chloride (K-DUR) 10 MEQ tablet TAKE 1 TABLET DAILY 90 tablet 3  . ramipril (ALTACE) 10 MG capsule Take 1 capsule (10 mg total) by mouth daily. 90 capsule 4  . rosuvastatin (CRESTOR) 20 MG tablet Take 1 tablet (20 mg total) by mouth daily. (Patient taking differently: Take 20 mg every other day by mouth. ) 90 tablet 4  . TOUJEO SOLOSTAR 300 UNIT/ML SOPN INJECT 50-60 UNITS INTO THE SKIN DAILY .INCREASE BY 1 UNITS/DAY UNTILFBS ARE BETWEEN 80-130. DO NOT EXCEED 60 UNITS PER DAY. 4.5 mL 6  . traMADol (ULTRAM) 50 MG tablet Take 1 tablet (50 mg total) by mouth 2 (two) times daily as needed. 10 tablet 0  . traZODone (DESYREL)  50 MG tablet Take 1-2 tablets (50-100 mg total) by mouth at bedtime as needed for sleep. 90 tablet 3  . valACYclovir (VALTREX) 1000 MG tablet Take 1 tablet (1,000 mg total) by mouth 2 (two) times daily. X 1 week with food 14 tablet 0  . Vitamin D, Cholecalciferol, 1000 units TABS Take 1,000 Units by mouth daily.      No current facility-administered medications for this visit.     Allergies:   Cortisone and Metformin and related   Social History:  The patient  reports that he has never smoked. He has never used smokeless tobacco. He reports that he drinks alcohol. He reports that he does not use drugs.   Family History:   family history includes Heart disease in his father; Lung cancer in his mother.    Review of Systems: Review of Systems  Constitutional: Negative.   Respiratory: Positive for  shortness of breath.   Cardiovascular: Negative.   Gastrointestinal: Negative.   Musculoskeletal: Negative.   Neurological: Negative.        Left flank nerve pain  weakness left side of face  All other systems reviewed and are negative.    PHYSICAL EXAM: VS:  BP (!) 162/70 (BP Location: Left Arm, Patient Position: Sitting, Cuff Size: Normal)   Pulse 64   Ht '6\' 2"'$  (1.88 m)   Wt 220 lb 4 oz (99.9 kg)   BMI 28.28 kg/m  , BMI Body mass index is 28.28 kg/m. Constitutional:  oriented to person, place, and time. No distress. Obese HENT: weakness to right side face, notable when smiling Head: Normocephalic and atraumatic.  Eyes:  no discharge. No scleral icterus.  Neck: Normal range of motion. Neck supple. No JVD present.  Cardiovascular: Normal rate, regular rhythm, normal heart sounds and intact distal pulses. Exam reveals no gallop and no friction rub. No edema No murmur heard. Pulmonary/Chest: Effort normal and breath sounds normal. No stridor. No respiratory distress.  no wheezes.  no rales.  no tenderness.  Abdominal: Soft.  no distension.  no tenderness.  Musculoskeletal: Normal range of motion.  no  tenderness or deformity.  Neurological:  normal muscle tone. Coordination normal. No atrophy Weakness right side of the face Skin: Skin is warm and dry. No rash noted. not diaphoretic.  Psychiatric:  normal mood and affect. behavior is normal. Thought content normal.    Recent Labs: 12/26/2017: Hemoglobin 13.2; Platelets 106 12/27/2017: ALT 12; BUN 16; Creatinine, Ser 1.21; Potassium 4.0; Sodium 140; TSH 1.38    Lipid Panel Lab Results  Component Value Date   CHOL WILL FOLLOW 12/27/2017   HDL 31.00 (L) 08/18/2017   LDLCALC 41 08/18/2017   TRIG WILL FOLLOW 12/27/2017      Wt Readings from Last 3 Encounters:  12/28/17 220 lb 4 oz (99.9 kg)  12/27/17 217 lb 3.2 oz (98.5 kg)  12/26/17 220 lb (99.8 kg)      ASSESSMENT AND PLAN:  Unstable angina, known  CAD/CABG Chronic stable shortness of breath  Sx getting worse per patient, concerning for ischemia Sx similar to before prior stent or before CABG Long discussion with him concerning various treatment options, Previous stress test 2017 with attenuation artifact Unable to treadmill, given worsening sx,  I have reviewed the risks, indications, and alternatives to cardiac catheterization, possible angioplasty, and stenting with the patient. Risks include but are not limited to bleeding, infection, vascular injury, stroke, myocardial infection, arrhythmia, kidney injury, radiation-related injury in the case of prolonged fluoroscopy use, emergency  cardiac surgery, and death. The patient understands the risks of serious complication is 1-2 in 4818 with diagnostic cardiac cath and 1-2% or less with angioplasty/stenting.  ---He would like to proceed with cardiac cath, next week would be better for him  Chronic diastolic CHF (congestive heart failure) ([place]) - Plan: EKG 12-Lead Continue Lasix daily with potassium  euvolemic CR 1.3 recently, labs reviewed with him  SOB (shortness of breath) - Plan: EKG 12-Lead Chronic shortness of breath, worse recently Cardiac cath planned as above  Bell's palsy Completed prednisone and antivirals Prior history of  shingles   Total encounter time more than 45 minutes  Greater than 50% was spent in counseling and coordination of care with the patient  Disposition:   F/U  1 month   Orders Placed This Encounter  Procedures  . EKG 12-Lead     Signed, Esmond Plants, M.D., Ph.D. 12/28/2017  [place], [place]

## 2017-12-28 NOTE — Patient Instructions (Addendum)
We will schedule a cardiac cath  [facility] Cardiac Cath Instructions   You are scheduled for a Cardiac Cath on:_Friday Nov. 1, 2019__  Please arrive at _11:30_am on the day of your procedure  Please expect a call from our [facility] to pre-register you  You may have clear liquids for breakfast before 6:30 AM  Someone will need to drive you home  It is recommended someone be with you for the first 24 hours after your procedure  Wear clothes that are easy to get on/off and wear slip on shoes if possible   Medications bring a current list of all medications with you  _X__ You may take all of your medications the morning of your procedure with enough water to swallow safely  _X__ Do not take these medications before your procedure:__NO Furosemide & NO Potassium & NO Insulin ____   Day of your procedure: Arrive at the [facility] entrance.  Free valet service is available.  After entering the [facility] please check-in at the registration desk (1st desk on your right) to receive your armband. After receiving your armband someone will escort you to the cardiac cath/special procedures waiting area.  The usual length of stay after your procedure is about 2 to 3 hours.  This can vary.  If you have any questions, please call our office at [phone], or you may call the cardiac cath lab at [facility] directly at [phone]  Medication Instructions:   No medication changes made  Labwork:  No new labs needed  Testing/Procedures:  No further testing at this time   Follow-Up: It was a pleasure seeing you in the office today. Please call us if you have new issues that need to be addressed before your next appt.  [phone]  Your physician wants you to follow-up in: 1 month.    If you need a refill on your cardiac medications before your next appointment, please call your pharmacy.  For educational health videos Log in to : [email or web address] Or :  [email or web address], password : [email or web address]

## 2017-12-29 ENCOUNTER — Other Ambulatory Visit: Payer: Self-pay

## 2017-12-29 DIAGNOSIS — Z86010 Personal history of colonic polyps: Secondary | ICD-10-CM

## 2017-12-29 DIAGNOSIS — Z1211 Encounter for screening for malignant neoplasm of colon: Secondary | ICD-10-CM

## 2017-12-29 LAB — NASH FIBROSURE
ALPHA 2-MACROGLOBULINS, QN: 187 mg/dL (ref 110–276)
ALT (SGPT) P5P: 15 IU/L (ref 0–55)
APOLIPOPROTEIN A I: 121 mg/dL (ref 101–178)
AST (SGOT) P5P: 23 IU/L (ref 0–40)
Bilirubin, Total: 0.5 mg/dL (ref 0.0–1.2)
Cholesterol, Total: 125 mg/dL (ref 100–199)
FIBROSIS SCORE: 0.83 — AB (ref 0.00–0.21)
GGT: 54 IU/L (ref 0–65)
GLUCOSE: 294 mg/dL — AB (ref 65–99)
HEIGHT: 74 in
NASH SCORE: 0.75 — AB
Steatosis Score: 0.75 — ABNORMAL HIGH (ref 0.00–0.30)
TRIGLYCERIDES: 289 mg/dL — AB (ref 0–149)
WEIGHT: 217 [lb_av]

## 2017-12-29 LAB — PROTIME-INR
INR: 1.1 (ref 0.8–1.2)
Prothrombin Time: 11.2 s (ref 9.1–12.0)

## 2017-12-29 LAB — GAMMA GT: GGT: 54 IU/L (ref 0–65)

## 2017-12-30 LAB — TESTOSTERONE TOTAL,FREE,BIO, MALES
Albumin: 3.9 g/dL (ref 3.6–5.1)
Sex Hormone Binding: 65 nmol/L (ref 22–77)
TESTOSTERONE FREE: 42.3 pg/mL (ref 6.0–73.0)
TESTOSTERONE: 556 ng/dL (ref 250–827)
Testosterone, Bioavailable: 76 ng/dL (ref 15.0–150.0)

## 2018-01-04 ENCOUNTER — Other Ambulatory Visit: Payer: Self-pay | Admitting: Cardiovascular Disease

## 2018-01-04 DIAGNOSIS — I200 Unstable angina: Secondary | ICD-10-CM

## 2018-01-05 ENCOUNTER — Other Ambulatory Visit: Payer: Self-pay | Admitting: Cardiovascular Disease

## 2018-01-05 ENCOUNTER — Ambulatory Visit
Admission: RE | Admit: 2018-01-05 | Discharge: 2018-01-05 | Disposition: A | Discharge status: Home / Self Care | Payer: Medicare Other | Source: Ambulatory Visit | Attending: Cardiovascular Disease | Admitting: Cardiovascular Disease

## 2018-01-05 ENCOUNTER — Encounter
Admission: RE | Disposition: A | Discharge status: Home / Self Care | Payer: Self-pay | Source: Ambulatory Visit | Attending: Cardiovascular Disease

## 2018-01-05 ENCOUNTER — Other Ambulatory Visit: Payer: Self-pay

## 2018-01-05 DIAGNOSIS — Z7982 Long term (current) use of aspirin: Secondary | ICD-10-CM | POA: Diagnosis not present

## 2018-01-05 DIAGNOSIS — Z96641 Presence of right artificial hip joint: Secondary | ICD-10-CM | POA: Insufficient documentation

## 2018-01-05 DIAGNOSIS — I110 Hypertensive heart disease with heart failure: Secondary | ICD-10-CM | POA: Insufficient documentation

## 2018-01-05 DIAGNOSIS — I25810 Atherosclerosis of coronary artery bypass graft(s) without angina pectoris: Secondary | ICD-10-CM | POA: Diagnosis not present

## 2018-01-05 DIAGNOSIS — Z794 Long term (current) use of insulin: Secondary | ICD-10-CM | POA: Diagnosis not present

## 2018-01-05 DIAGNOSIS — Z79899 Other long term (current) drug therapy: Secondary | ICD-10-CM | POA: Diagnosis not present

## 2018-01-05 DIAGNOSIS — N529 Male erectile dysfunction, unspecified: Secondary | ICD-10-CM | POA: Diagnosis not present

## 2018-01-05 DIAGNOSIS — I5032 Chronic diastolic (congestive) heart failure: Secondary | ICD-10-CM | POA: Diagnosis not present

## 2018-01-05 DIAGNOSIS — Z981 Arthrodesis status: Secondary | ICD-10-CM | POA: Insufficient documentation

## 2018-01-05 DIAGNOSIS — Z9049 Acquired absence of other specified parts of digestive tract: Secondary | ICD-10-CM | POA: Diagnosis not present

## 2018-01-05 DIAGNOSIS — Z9841 Cataract extraction status, right eye: Secondary | ICD-10-CM | POA: Diagnosis not present

## 2018-01-05 DIAGNOSIS — I2584 Coronary atherosclerosis due to calcified coronary lesion: Secondary | ICD-10-CM | POA: Diagnosis not present

## 2018-01-05 DIAGNOSIS — E785 Hyperlipidemia, unspecified: Secondary | ICD-10-CM | POA: Diagnosis not present

## 2018-01-05 DIAGNOSIS — Z86018 Personal history of other benign neoplasm: Secondary | ICD-10-CM | POA: Insufficient documentation

## 2018-01-05 DIAGNOSIS — K219 Gastro-esophageal reflux disease without esophagitis: Secondary | ICD-10-CM | POA: Diagnosis not present

## 2018-01-05 DIAGNOSIS — M1990 Unspecified osteoarthritis, unspecified site: Secondary | ICD-10-CM | POA: Insufficient documentation

## 2018-01-05 DIAGNOSIS — Z9889 Other specified postprocedural states: Secondary | ICD-10-CM | POA: Insufficient documentation

## 2018-01-05 DIAGNOSIS — R0602 Shortness of breath: Secondary | ICD-10-CM | POA: Diagnosis not present

## 2018-01-05 DIAGNOSIS — Z8249 Family history of ischemic heart disease and other diseases of the circulatory system: Secondary | ICD-10-CM | POA: Insufficient documentation

## 2018-01-05 DIAGNOSIS — Z9842 Cataract extraction status, left eye: Secondary | ICD-10-CM | POA: Diagnosis not present

## 2018-01-05 DIAGNOSIS — Z888 Allergy status to other drugs, medicaments and biological substances status: Secondary | ICD-10-CM | POA: Diagnosis not present

## 2018-01-05 DIAGNOSIS — N400 Enlarged prostate without lower urinary tract symptoms: Secondary | ICD-10-CM | POA: Diagnosis not present

## 2018-01-05 DIAGNOSIS — Z955 Presence of coronary angioplasty implant and graft: Secondary | ICD-10-CM | POA: Diagnosis not present

## 2018-01-05 DIAGNOSIS — I200 Unstable angina: Secondary | ICD-10-CM

## 2018-01-05 DIAGNOSIS — E119 Type 2 diabetes mellitus without complications: Secondary | ICD-10-CM | POA: Diagnosis not present

## 2018-01-05 HISTORY — PX: RIGHT/LEFT HEART CATH AND CORONARY/GRAFT ANGIOGRAPHY: CATH118267

## 2018-01-05 LAB — GLUCOSE, CAPILLARY: GLUCOSE-CAPILLARY: 113 mg/dL — AB (ref 70–99)

## 2018-01-05 SURGERY — RIGHT AND LEFT HEART CATH
Anesthesia: Moderate Sedation | Laterality: Bilateral

## 2018-01-05 SURGERY — RIGHT/LEFT HEART CATH AND CORONARY/GRAFT ANGIOGRAPHY
Anesthesia: Moderate Sedation

## 2018-01-05 MED ORDER — SODIUM CHLORIDE 0.9 % IV SOLN: 250.0000 mL | INTRAVENOUS | Status: DC | PRN

## 2018-01-05 MED ORDER — FENTANYL CITRATE (PF) 100 MCG/2ML IJ SOLN
INTRAMUSCULAR | Status: DC | PRN
  Administered 2018-01-05: 50 ug via INTRAVENOUS

## 2018-01-05 MED ORDER — SODIUM CHLORIDE 0.9 % WEIGHT BASED INFUSION: 1.0000 mL/kg/h | INTRAVENOUS | Status: DC

## 2018-01-05 MED ORDER — ACETAMINOPHEN 325 MG PO TABS: 650.0000 mg | ORAL_TABLET | ORAL | Status: DC | PRN

## 2018-01-05 MED ORDER — SODIUM CHLORIDE 0.9% FLUSH: 3.0000 mL | INTRAVENOUS | Status: DC | PRN

## 2018-01-05 MED ORDER — AMLODIPINE BESYLATE 5 MG PO TABS
10.0000 mg | ORAL_TABLET | Freq: Once | ORAL | Status: AC
  Administered 2018-01-05: 10 mg via ORAL

## 2018-01-05 MED ORDER — HYDRALAZINE HCL 20 MG/ML IJ SOLN
20.0000 mg | Freq: Once | INTRAMUSCULAR | Status: AC
  Administered 2018-01-05: 20 mg via INTRAVENOUS

## 2018-01-05 MED ORDER — FENTANYL CITRATE (PF) 100 MCG/2ML IJ SOLN
INTRAMUSCULAR | Status: AC
  Filled 2018-01-05: qty 2

## 2018-01-05 MED ORDER — HYDRALAZINE HCL 20 MG/ML IJ SOLN
INTRAMUSCULAR | Status: DC | PRN
  Administered 2018-01-05: 20 mg via INTRAVENOUS

## 2018-01-05 MED ORDER — AMLODIPINE BESYLATE 5 MG PO TABS: 5.0000 mg | ORAL_TABLET | Freq: Every day | ORAL | 3 refills | Status: DC

## 2018-01-05 MED ORDER — HYDRALAZINE HCL 20 MG/ML IJ SOLN
INTRAMUSCULAR | Status: AC
  Filled 2018-01-05: qty 1

## 2018-01-05 MED ORDER — MIDAZOLAM HCL 2 MG/2ML IJ SOLN
INTRAMUSCULAR | Status: DC | PRN
  Administered 2018-01-05: 1 mg via INTRAVENOUS

## 2018-01-05 MED ORDER — IOPAMIDOL (ISOVUE-300) INJECTION 61%
INTRAVENOUS | Status: DC | PRN
  Administered 2018-01-05: 140 mL via INTRA_ARTERIAL

## 2018-01-05 MED ORDER — MIDAZOLAM HCL 2 MG/2ML IJ SOLN
INTRAMUSCULAR | Status: AC
  Filled 2018-01-05: qty 2

## 2018-01-05 MED ORDER — SODIUM CHLORIDE 0.9% FLUSH: 3.0000 mL | Freq: Two times a day (BID) | INTRAVENOUS | Status: DC

## 2018-01-05 MED ORDER — ASPIRIN 81 MG PO CHEW: 81.0000 mg | CHEWABLE_TABLET | ORAL | Status: DC

## 2018-01-05 MED ORDER — HEPARIN (PORCINE) IN NACL 1000-0.9 UT/500ML-% IV SOLN
INTRAVENOUS | Status: AC
  Filled 2018-01-05: qty 1000

## 2018-01-05 MED ORDER — FUROSEMIDE 20 MG PO TABS: 20.0000 mg | ORAL_TABLET | Freq: Two times a day (BID) | ORAL | 3 refills | Status: DC

## 2018-01-05 MED ORDER — ONDANSETRON HCL 4 MG/2ML IJ SOLN: 4.0000 mg | Freq: Four times a day (QID) | INTRAMUSCULAR | Status: DC | PRN

## 2018-01-05 MED ORDER — FUROSEMIDE 20 MG PO TABS: 20.0000 mg | ORAL_TABLET | Freq: Every day | ORAL | 3 refills | Status: DC

## 2018-01-05 MED ORDER — POTASSIUM CHLORIDE ER 10 MEQ PO TBCR: 10.0000 meq | EXTENDED_RELEASE_TABLET | Freq: Two times a day (BID) | ORAL | 3 refills | Status: DC

## 2018-01-05 MED ORDER — RAMIPRIL 10 MG PO CAPS: 10.0000 mg | ORAL_CAPSULE | Freq: Two times a day (BID) | ORAL | 4 refills | Status: DC

## 2018-01-05 MED ORDER — SODIUM CHLORIDE 0.9 % WEIGHT BASED INFUSION
3.0000 mL/kg/h | INTRAVENOUS | Status: AC
  Administered 2018-01-05: 3 mL/kg/h via INTRAVENOUS

## 2018-01-05 MED ORDER — AMLODIPINE BESYLATE 5 MG PO TABS
ORAL_TABLET | ORAL | Status: AC
  Filled 2018-01-05: qty 2

## 2018-01-05 SURGICAL SUPPLY — 15 items
CATH INFINITI 5 FR IM (CATHETERS) ×2 IMPLANT
CATH INFINITI 5FR ANG PIGTAIL (CATHETERS) ×2 IMPLANT
CATH INFINITI 5FR JL4 (CATHETERS) ×2 IMPLANT
CATH INFINITI JR4 5F (CATHETERS) ×2 IMPLANT
CATH SWANZ 7F THERMO (CATHETERS) ×2 IMPLANT
DEVICE CLOSURE MYNXGRIP 5F (Vascular Products) ×2 IMPLANT
DEVICE SAFEGUARD 24CM (GAUZE/BANDAGES/DRESSINGS) ×2 IMPLANT
KIT MANI 3VAL PERCEP (MISCELLANEOUS) ×2 IMPLANT
KIT RIGHT HEART (MISCELLANEOUS) ×2 IMPLANT
NEEDLE PERC 18GX7CM (NEEDLE) ×2 IMPLANT
PACK CARDIAC CATH (CUSTOM PROCEDURE TRAY) ×2 IMPLANT
SHEATH AVANTI 5FR X 11CM (SHEATH) ×2 IMPLANT
SHEATH AVANTI 7FRX11 (SHEATH) ×2 IMPLANT
WIRE EMERALD 3MM-J .035X260CM (WIRE) ×2 IMPLANT
WIRE GUIDERIGHT .035X150 (WIRE) ×2 IMPLANT

## 2018-01-05 NOTE — Progress Notes (Signed)
Dr. Rockey Situ spent 15 min. Speaking with pt. And his spouse re: cath results. New orders received and reviewed with pt. And his wife. Both verbalized understanding . Pt. Med. With Hydralazine IV and Norvasc p.o. Per MD ordes for BP 200/78. Right groin clean, dry, intact without hematoma, edema, drainage, ecchymosis. In no acute distress.

## 2018-01-06 NOTE — H&P (Signed)
H&P Addendum, precardiac catheterization  Patient was seen and evaluated prior to Cardiac catheterization procedure Symptoms, prior testing details again confirmed with the patient Patient examined, no significant change from prior exam Lab work reviewed in detail personally by myself Patient understands risk and benefit of the procedure, willing to proceed  Signed, [name] Gollan, MD, Ph.D [facility]    

## 2018-01-07 ENCOUNTER — Observation Stay
Admission: EM | Admit: 2018-01-07 | Discharge: 2018-01-09 | Disposition: A | Discharge status: Home / Self Care | Payer: Medicare Other | Attending: Internal Medicine | Admitting: Internal Medicine

## 2018-01-07 ENCOUNTER — Encounter: Payer: Self-pay | Admitting: *Deleted

## 2018-01-07 ENCOUNTER — Emergency Department: Payer: Medicare Other

## 2018-01-07 ENCOUNTER — Other Ambulatory Visit: Payer: Self-pay

## 2018-01-07 DIAGNOSIS — R7989 Other specified abnormal findings of blood chemistry: Secondary | ICD-10-CM | POA: Diagnosis not present

## 2018-01-07 DIAGNOSIS — I160 Hypertensive urgency: Secondary | ICD-10-CM | POA: Diagnosis not present

## 2018-01-07 DIAGNOSIS — Z9842 Cataract extraction status, left eye: Secondary | ICD-10-CM | POA: Diagnosis not present

## 2018-01-07 DIAGNOSIS — I248 Other forms of acute ischemic heart disease: Secondary | ICD-10-CM

## 2018-01-07 DIAGNOSIS — D696 Thrombocytopenia, unspecified: Secondary | ICD-10-CM | POA: Diagnosis not present

## 2018-01-07 DIAGNOSIS — Z9841 Cataract extraction status, right eye: Secondary | ICD-10-CM | POA: Insufficient documentation

## 2018-01-07 DIAGNOSIS — E1122 Type 2 diabetes mellitus with diabetic chronic kidney disease: Secondary | ICD-10-CM | POA: Diagnosis not present

## 2018-01-07 DIAGNOSIS — I5032 Chronic diastolic (congestive) heart failure: Secondary | ICD-10-CM | POA: Diagnosis not present

## 2018-01-07 DIAGNOSIS — Z7982 Long term (current) use of aspirin: Secondary | ICD-10-CM | POA: Diagnosis not present

## 2018-01-07 DIAGNOSIS — I272 Pulmonary hypertension, unspecified: Secondary | ICD-10-CM | POA: Diagnosis not present

## 2018-01-07 DIAGNOSIS — R0789 Other chest pain: Secondary | ICD-10-CM | POA: Diagnosis not present

## 2018-01-07 DIAGNOSIS — Z794 Long term (current) use of insulin: Secondary | ICD-10-CM | POA: Insufficient documentation

## 2018-01-07 DIAGNOSIS — N183 Chronic kidney disease, stage 3 (moderate): Secondary | ICD-10-CM | POA: Diagnosis not present

## 2018-01-07 DIAGNOSIS — Z79899 Other long term (current) drug therapy: Secondary | ICD-10-CM | POA: Insufficient documentation

## 2018-01-07 DIAGNOSIS — I2489 Other forms of acute ischemic heart disease: Secondary | ICD-10-CM

## 2018-01-07 DIAGNOSIS — N400 Enlarged prostate without lower urinary tract symptoms: Secondary | ICD-10-CM | POA: Insufficient documentation

## 2018-01-07 DIAGNOSIS — E876 Hypokalemia: Secondary | ICD-10-CM | POA: Diagnosis not present

## 2018-01-07 DIAGNOSIS — R079 Chest pain, unspecified: Secondary | ICD-10-CM | POA: Diagnosis present

## 2018-01-07 DIAGNOSIS — Z888 Allergy status to other drugs, medicaments and biological substances status: Secondary | ICD-10-CM | POA: Diagnosis not present

## 2018-01-07 DIAGNOSIS — D631 Anemia in chronic kidney disease: Secondary | ICD-10-CM | POA: Insufficient documentation

## 2018-01-07 DIAGNOSIS — Z85828 Personal history of other malignant neoplasm of skin: Secondary | ICD-10-CM | POA: Insufficient documentation

## 2018-01-07 DIAGNOSIS — E1165 Type 2 diabetes mellitus with hyperglycemia: Secondary | ICD-10-CM | POA: Diagnosis not present

## 2018-01-07 DIAGNOSIS — E119 Type 2 diabetes mellitus without complications: Secondary | ICD-10-CM | POA: Diagnosis not present

## 2018-01-07 DIAGNOSIS — Z961 Presence of intraocular lens: Secondary | ICD-10-CM | POA: Insufficient documentation

## 2018-01-07 DIAGNOSIS — E669 Obesity, unspecified: Secondary | ICD-10-CM | POA: Diagnosis not present

## 2018-01-07 DIAGNOSIS — M1990 Unspecified osteoarthritis, unspecified site: Secondary | ICD-10-CM | POA: Insufficient documentation

## 2018-01-07 DIAGNOSIS — I2510 Atherosclerotic heart disease of native coronary artery without angina pectoris: Secondary | ICD-10-CM | POA: Diagnosis present

## 2018-01-07 DIAGNOSIS — I10 Essential (primary) hypertension: Secondary | ICD-10-CM | POA: Diagnosis not present

## 2018-01-07 DIAGNOSIS — K219 Gastro-esophageal reflux disease without esophagitis: Secondary | ICD-10-CM | POA: Insufficient documentation

## 2018-01-07 DIAGNOSIS — I130 Hypertensive heart and chronic kidney disease with heart failure and stage 1 through stage 4 chronic kidney disease, or unspecified chronic kidney disease: Secondary | ICD-10-CM | POA: Insufficient documentation

## 2018-01-07 DIAGNOSIS — E118 Type 2 diabetes mellitus with unspecified complications: Secondary | ICD-10-CM | POA: Diagnosis present

## 2018-01-07 DIAGNOSIS — I4510 Unspecified right bundle-branch block: Secondary | ICD-10-CM | POA: Insufficient documentation

## 2018-01-07 DIAGNOSIS — Z6827 Body mass index (BMI) 27.0-27.9, adult: Secondary | ICD-10-CM | POA: Insufficient documentation

## 2018-01-07 DIAGNOSIS — E785 Hyperlipidemia, unspecified: Secondary | ICD-10-CM | POA: Diagnosis present

## 2018-01-07 DIAGNOSIS — Z951 Presence of aortocoronary bypass graft: Secondary | ICD-10-CM | POA: Insufficient documentation

## 2018-01-07 DIAGNOSIS — N189 Chronic kidney disease, unspecified: Secondary | ICD-10-CM | POA: Diagnosis present

## 2018-01-07 DIAGNOSIS — Z96641 Presence of right artificial hip joint: Secondary | ICD-10-CM | POA: Insufficient documentation

## 2018-01-07 DIAGNOSIS — I25119 Atherosclerotic heart disease of native coronary artery with unspecified angina pectoris: Secondary | ICD-10-CM | POA: Insufficient documentation

## 2018-01-07 DIAGNOSIS — N179 Acute kidney failure, unspecified: Secondary | ICD-10-CM | POA: Diagnosis present

## 2018-01-07 DIAGNOSIS — R778 Other specified abnormalities of plasma proteins: Secondary | ICD-10-CM

## 2018-01-07 LAB — BASIC METABOLIC PANEL
Anion gap: 6 (ref 5–15)
BUN: 22 mg/dL (ref 8–23)
CALCIUM: 8.7 mg/dL — AB (ref 8.9–10.3)
CHLORIDE: 107 mmol/L (ref 98–111)
CO2: 24 mmol/L (ref 22–32)
CREATININE: 1.39 mg/dL — AB (ref 0.61–1.24)
GFR calc Af Amer: 53 mL/min — ABNORMAL LOW (ref >60)
GFR, EST NON AFRICAN AMERICAN: 46 mL/min — AB (ref >60)
Glucose, Bld: 335 mg/dL — ABNORMAL HIGH (ref 70–99)
Potassium: 4 mmol/L (ref 3.5–5.1)
SODIUM: 137 mmol/L (ref 135–145)

## 2018-01-07 LAB — CBC
HEMATOCRIT: 37.1 % — AB (ref 39.0–52.0)
Hemoglobin: 13.5 g/dL (ref 13.0–17.0)
MCH: 33.3 pg (ref 26.0–34.0)
MCHC: 36.4 g/dL — AB (ref 30.0–36.0)
MCV: 91.4 fL (ref 80.0–100.0)
Platelets: 97 10*3/uL — ABNORMAL LOW (ref 150–400)
RBC: 4.06 MIL/uL — ABNORMAL LOW (ref 4.22–5.81)
RDW: 13.3 % (ref 11.5–15.5)
WBC: 6.2 10*3/uL (ref 4.0–10.5)
nRBC: 0 % (ref 0.0–0.2)

## 2018-01-07 LAB — TROPONIN I: Troponin I: 0.43 ng/mL (ref <0.03)

## 2018-01-07 MED ORDER — INSULIN ASPART 100 UNIT/ML ~~LOC~~ SOLN
5.0000 [IU] | Freq: Once | SUBCUTANEOUS | Status: AC
  Administered 2018-01-07: 5 [IU] via SUBCUTANEOUS
  Filled 2018-01-07: qty 1

## 2018-01-07 MED ORDER — CARVEDILOL 6.25 MG PO TABS
12.5000 mg | ORAL_TABLET | Freq: Once | ORAL | Status: AC
  Administered 2018-01-07: 12.5 mg via ORAL
  Filled 2018-01-07: qty 2

## 2018-01-07 MED ORDER — HYDRALAZINE HCL 20 MG/ML IJ SOLN
20.0000 mg | Freq: Once | INTRAMUSCULAR | Status: AC
  Administered 2018-01-07: 20 mg via INTRAVENOUS
  Filled 2018-01-07: qty 1

## 2018-01-07 NOTE — ED Triage Notes (Signed)
Pt to ED reporting HTN and burning pain around his left breast. Pt reports the pain has been present since he received a shingles shot in September but the HTN worried his today because he had a cardiac cath performed on Friday and was told if his BP was over 200 he should come the the ED pts BP at home was 210/100. BP in the lobby was 190/75 and Bp in triage is 173/76. No neuro symptoms and no cardiac symptoms at this time .

## 2018-01-07 NOTE — H&P (Signed)
[facility] [place] NAME: [name] [name]    MR#:  [id number]  DATE OF BIRTH:  [birth year]  DATE OF ADMISSION:  01/07/2018  PRIMARY CARE PHYSICIAN: [name], MD   REQUESTING/REFERRING PHYSICIAN: Jimmye Norman, MD  CHIEF COMPLAINT:   Chief Complaint  Patient presents with  . Hypertension    HISTORY OF PRESENT ILLNESS:  [name] [name]  is a 84 y.o. male who presents with chief complaint as above.  Patient presents to the ED tonight due to severely elevated blood pressure.  He just had cardiac catheterization within the last 48 hours, which showed multiple significant blockages for which maximal medical management was recommended.  During catheterization his blood pressure was severely elevated and his cardiologist changed some of his medications and told him to monitor his blood pressure.  He has been checking it with his cough, and throughout the day today it has been elevated but tonight his systolic reached greater than 200 so he came to the ED for evaluation.  Measurements here were consistent with what he had measured at home.  Here in the ED he was found to have troponin elevated at 0.43.  Hospitalist were called for admission for further evaluation and treatment for his blood pressure and his elevated troponin.   Of note, patient also complains of left-sided thoracic wall burning sensation.  He states that he first had this several years ago after he got the shingles shot, and it lasted for about 3 months.  He just recently received another shingles booster shot about 2 months ago, and has the same sensation now.  PAST MEDICAL HISTORY:   Past Medical History:  Diagnosis Date  . BPH (benign prostatic hyperplasia)   . Cancer ([place])    skin  . Cataract   . Coronary artery disease    a. 4v CABG 01/2005; b. cath 06/05/13 showed patent grafts: LIMA-LAD, VG-D, VG-OM3, VG-dRCA, nl filling pressures, nl LV fxn  . Diastolic dysfunction     a. echo 06/100: nl systolic fxn, mild LVH, diastolic relaxation abnormality, mildly enlarged LA, mild Ao insufficiency  . Dyspnea    with exertion  . ED (erectile dysfunction)   . GERD (gastroesophageal reflux disease)   . Hyperlipidemia   . Hypertension   . IDDM (insulin dependent diabetes mellitus) ([place]) 2000  . Osteoarthritis   . Perirectal fistula   . Pneumonia 12/2016  . Tubular adenoma of colon 07/2012  . Vertigo      PAST SURGICAL HISTORY:   Past Surgical History:  Procedure Laterality Date  . BACK SURGERY    . CARDIAC CATHETERIZATION  06/05/2013   [facility]   Marland Kitchen CARDIAC CATHETERIZATION N/A 09/24/2015   Procedure: Right Heart Cath and Coronary/Graft Angiography;  Surgeon: Minna Merritts, MD;  Location: [place] [facility];  Service: Cardiovascular;  Laterality: N/A;  . CATARACT EXTRACTION  sept 2013   right  . CATARACT EXTRACTION W/PHACO Left 03/28/2017   Procedure: CATARACT EXTRACTION PHACO AND INTRAOCULAR LENS PLACEMENT (IOC);  Surgeon: Birder Robson, MD;  Location: [facility] OR[facility];  Service: Ophthalmology;  Laterality: Left;  [place] 00:42.0 AP% 15.0 CDE 6.31 Fluid Pack lot # [id number]  . CHOLECYSTECTOMY  05/15/2011   Procedure: LAPAROSCOPIC CHOLECYSTECTOMY;  Surgeon: Rolm Bookbinder, MD;  Location: [place];  Service: General;  Laterality: N/A;  . COLONOSCOPY W/ POLYPECTOMY    . CORONARY ARTERY BYPASS GRAFT  2007   x 4  . FOOT SURGERY  2012   right  foot  . JOINT REPLACEMENT  8/10   Right THR--[name]  . LEFT AND RIGHT HEART CATHETERIZATION WITH CORONARY/GRAFT ANGIOGRAM N/A 06/05/2013   Procedure: LEFT AND RIGHT HEART CATHETERIZATION WITH Beatrix Fetters;  Surgeon: Peter M Martinique, MD;  Location: [facility] CATH LAB;  Service: Cardiovascular;  Laterality: N/A;  . LUMBAR LAMINECTOMY  1989  . Prostate photovaporization  5/16   Dr Budd Palmer     SOCIAL HISTORY:   Social History   Tobacco Use  . Smoking status: Never Smoker  . Smokeless tobacco: Never Used   Substance Use Topics  . Alcohol use: Yes    Comment: occ cocktail     FAMILY HISTORY:   Family History  Problem Relation Age of Onset  . Lung cancer Mother   . Heart disease Father   . Colon cancer Neg Hx      DRUG ALLERGIES:   Allergies  Allergen Reactions  . Cortisone Other (See Comments)    Leg Cramps  . Metformin And Related Diarrhea    MEDICATIONS AT HOME:   Prior to Admission medications   Medication Sig Start Date End Date Taking? Authorizing Provider  acetaminophen (TYLENOL) 500 MG tablet Take 1,000 mg by mouth 2 (two) times daily.    [provider]  amLODipine (NORVASC) 5 MG tablet Take 1 tablet (5 mg total) by mouth daily. 01/05/18   Minna Merritts, MD  aspirin EC 81 MG tablet Take 81 mg by mouth daily.    [provider]  BD INSULIN SYRINGE ULTRAFINE 31G X 15/64" 0.3 ML MISC USE AS DIRECTED 07/31/15   Viviana Simpler I, MD  blood glucose meter kit and supplies KIT OneTouch Verio w/ Device Kit OneTouch Verio VI STRP OneTouch Delica Lancet Dev misc OneTouch Delica Lancets 33L or Fine  Please check CBGs fasting and 2 other times during the day  Dx Code E11.8 08/18/17   [name], MD  carvedilol (COREG) 12.5 MG tablet Take 1 tablet (12.5 mg total) by mouth 2 (two) times daily. 12/26/16   Minna Merritts, MD  finasteride (PROSCAR) 5 MG tablet Take 1 tablet (5 mg total) by mouth daily. Patient taking differently: Take 5 mg by mouth at bedtime.  04/16/12   Venia Carbon, MD  furosemide (LASIX) 20 MG tablet Take 1 tablet (20 mg total) by mouth 2 (two) times daily. 01/05/18   Minna Merritts, MD  gabapentin (NEURONTIN) 300 MG capsule Take 1 capsule (300 mg total) by mouth at bedtime. 12/26/17   McLean-Scocuzza, Nino Glow, MD  Insulin Pen Needle (BD PEN NEEDLE NANO U/F) 32G X 4 MM MISC USE AS DIRECTED 09/06/17   [name], MD  omeprazole (PRILOSEC) 20 MG capsule Take 20 mg by mouth daily.    [provider]  potassium  chloride (K-DUR) 10 MEQ tablet Take 1 tablet (10 mEq total) by mouth 2 (two) times daily. 01/05/18   Minna Merritts, MD  ramipril (ALTACE) 10 MG capsule Take 1 capsule (10 mg total) by mouth 2 (two) times daily. 01/05/18   Minna Merritts, MD  rosuvastatin (CRESTOR) 20 MG tablet Take 1 tablet (20 mg total) by mouth daily. Patient taking differently: Take 20 mg by mouth every evening.  12/26/16   Minna Merritts, MD  TOUJEO SOLOSTAR 300 UNIT/ML SOPN INJECT 50-60 UNITS INTO THE SKIN DAILY .INCREASE BY 1 UNITS/DAY UNTILFBS ARE BETWEEN 80-130. DO NOT EXCEED 60 UNITS PER DAY. Patient taking differently: Inject 46-48 Units into the skin daily.  09/21/17   [name], MD  traMADol (ULTRAM) 50 MG tablet Take 1 tablet (50 mg total) by mouth 2 (two) times daily as needed. Patient taking differently: Take 50 mg by mouth 2 (two) times daily as needed (for pain.).  12/26/17   McLean-Scocuzza, Nino Glow, MD  traZODone (DESYREL) 50 MG tablet Take 1-2 tablets (50-100 mg total) by mouth at bedtime as needed for sleep. 11/09/16   Coral Spikes, DO  valACYclovir (VALTREX) 1000 MG tablet Take 1 tablet (1,000 mg total) by mouth 2 (two) times daily. X 1 week with food Patient not taking: Reported on 01/03/2018 12/26/17   McLean-Scocuzza, Nino Glow, MD  vitamin B-12 (CYANOCOBALAMIN) 50 MCG tablet Take 50 mcg by mouth daily.    [provider]  Vitamin D, Cholecalciferol, 1000 units TABS Take 1,000 Units by mouth daily.     [provider]    REVIEW OF SYSTEMS:  Review of Systems  Constitutional: Negative for chills, fever, malaise/fatigue and weight loss.  HENT: Negative for ear pain, hearing loss and tinnitus.   Eyes: Negative for blurred vision, double vision, pain and redness.  Respiratory: Negative for cough, hemoptysis and shortness of breath.   Cardiovascular: Negative for chest pain, palpitations, orthopnea and leg swelling.       Chest wall pain as per HPI  Gastrointestinal: Negative  for abdominal pain, constipation, diarrhea, nausea and vomiting.  Genitourinary: Negative for dysuria, frequency and hematuria.  Musculoskeletal: Negative for back pain, joint pain and neck pain.  Skin:       No acne, rash, or lesions  Neurological: Negative for dizziness, tremors, focal weakness and weakness.  Endo/Heme/Allergies: Negative for polydipsia. Does not bruise/bleed easily.  Psychiatric/Behavioral: Negative for depression. The patient is not nervous/anxious and does not have insomnia.      VITAL SIGNS:   Vitals:   01/07/18 2120 01/07/18 2230 01/07/18 2254 01/07/18 2300  BP:  (!) 207/87 (!) 207/87 (!) 160/63  Pulse:  65    Resp:  19  (!) 26  Temp:      TempSrc:      SpO2:  97%    Weight: 97.1 kg     Height: 6' 2"  (1.88 m)      Wt Readings from Last 3 Encounters:  01/07/18 97.1 kg  01/05/18 97.1 kg  12/28/17 99.9 kg    PHYSICAL EXAMINATION:  Physical Exam  Vitals reviewed. Constitutional: He is oriented to person, place, and time. He appears well-developed and well-nourished. No distress.  HENT:  Head: Normocephalic and atraumatic.  Mouth/Throat: Oropharynx is clear and moist.  Eyes: Pupils are equal, round, and reactive to light. Conjunctivae and EOM are normal. No scleral icterus.  Neck: Normal range of motion. Neck supple. No JVD present. No thyromegaly present.  Cardiovascular: Normal rate, regular rhythm and intact distal pulses. Exam reveals no gallop and no friction rub.  No murmur heard. Respiratory: Effort normal and breath sounds normal. No respiratory distress. He has no wheezes. He has no rales.  GI: Soft. Bowel sounds are normal. He exhibits no distension. There is no tenderness.  Musculoskeletal: Normal range of motion. He exhibits no edema.  No arthritis, no gout  Lymphadenopathy:    He has no cervical adenopathy.  Neurological: He is alert and oriented to person, place, and time. No cranial nerve deficit.  No dysarthria, no aphasia  Skin: Skin  is warm and dry. No rash noted. No erythema.  Psychiatric: He has a normal mood and affect. His  behavior is normal. Judgment and thought content normal.    LABORATORY PANEL:   CBC Recent Labs  Lab 01/07/18 2128  WBC 6.2  HGB 13.5  HCT 37.1*  PLT 97*   ------------------------------------------------------------------------------------------------------------------  Chemistries  Recent Labs  Lab 01/07/18 2128  NA 137  K 4.0  CL 107  CO2 24  GLUCOSE 335*  BUN 22  CREATININE 1.39*  CALCIUM 8.7*   ------------------------------------------------------------------------------------------------------------------  Cardiac Enzymes Recent Labs  Lab 01/07/18 2128  TROPONINI 0.43*   ------------------------------------------------------------------------------------------------------------------  RADIOLOGY:  Dg Chest 1 View  Result Date: 01/07/2018 CLINICAL DATA:  Hypertension and burning pain around the left breast. Pain began with a shingle shot in September. Hypertension today. Cardiac catheterization on Friday. EXAM: CHEST  1 VIEW COMPARISON:  11/29/2016 FINDINGS: Postoperative changes in the mediastinum. Normal heart size and pulmonary vascularity. No focal airspace disease or consolidation in the lungs. No blunting of costophrenic angles. No pneumothorax. Mediastinal contours appear intact. Calcification of the aorta. IMPRESSION: No active disease. Electronically Signed   By: Lucienne Capers M.D.   On: 01/07/2018 22:40    EKG:   Orders placed or performed during the hospital encounter of 01/07/18  . ED EKG within 10 minutes  . ED EKG within 10 minutes    IMPRESSION AND PLAN:  Principal Problem:   Accelerated hypertension -continue home dose antihypertensives, additional PRN antihypertensives for blood pressure goal less than 160/100.  His elevated pressure could potentially be exacerbated by this thoracic wall discomfort he is having, do suspect this is related to  his shingles vaccination.  I have increased his gabapentin dose to see if that alleviates his symptoms Active Problems:   CAD (coronary artery disease) -continue home meds, suspect elevated cardiac enzyme is related to status post catheterization, likely in conjunction with his accelerated hypertension, trend enzymes tonight and get cardiology consult   Chronic diastolic CHF (congestive heart failure) ([place]) -continue home meds   Type 2 diabetes mellitus with complications ([place]) -sliding scale insulin with corresponding glucose checks   Hyperlipidemia -Home dose antilipid  Chart review performed and case discussed with ED provider. Labs, imaging and/or ECG reviewed by provider and discussed with patient/family. Management plans discussed with the patient and/or family.  DVT PROPHYLAXIS: SubQ lovenox   GI PROPHYLAXIS:  None  ADMISSION STATUS: Observation  CODE STATUS: Full Code Status History    Date Active Date Inactive Code Status Order ID Comments User Context   01/05/2018 1615 01/05/2018 2034 Full Code [id number]  Minna Merritts, MD Inpatient   09/24/2015 1453 09/24/2015 1925 Full Code [id number]  Minna Merritts, MD Inpatient   06/05/2013 1646 06/06/2013 1801 Full Code [id number]  Martinique, Peter M, MD Inpatient   06/03/2013 2343 06/05/2013 1646 Full Code [id number]  Etta Quill, DO ED    Advance Directive Documentation     Most Recent Value  Type of Advance Directive  Healthcare Power of Attorney  Pre-existing out of facility DNR order (yellow form or pink MOST form)  -  "MOST" Form in Place?  -      TOTAL TIME TAKING CARE OF THIS PATIENT: 40 minutes.   Kaden Dunkel [place] 01/07/2018, 11:55 PM  [employer]  [phone]  CC: Primary care physician; [name], MD  Note:  This document was prepared using Dragon voice recognition software and may include unintentional dictation errors.

## 2018-01-07 NOTE — ED Provider Notes (Signed)
[facility] Emergency Department Provider Note       Time seen: ----------------------------------------- 10:03 PM on 01/07/2018 -----------------------------------------   I have reviewed the triage vital signs and the nursing notes.  HISTORY   Chief Complaint Hypertension    HPI [name] [name] is a 84 y.o. male with a history of coronary artery disease, GERD, hyper lipidemia, hypertension, osteoarthritis, diabetes who presents to the ED for hypertension and a burning pain around his left breast.  He reports the pain is been present since he received a shingles shot in September.  Patient reports he had a cardiac cath performed on Friday and was told if his blood pressure was over 200 she he should come to the ER.  Blood pressure at home was 210/100.  Past Medical History:  Diagnosis Date  . BPH (benign prostatic hyperplasia)   . Cancer ([place])    skin  . Cataract   . Coronary artery disease    a. 4v CABG 01/2005; b. cath 06/05/13 showed patent grafts: LIMA-LAD, VG-D, VG-OM3, VG-dRCA, nl filling pressures, nl LV fxn  . Diastolic dysfunction    a. echo 07/2839: nl systolic fxn, mild LVH, diastolic relaxation abnormality, mildly enlarged LA, mild Ao insufficiency  . Dyspnea    with exertion  . ED (erectile dysfunction)   . GERD (gastroesophageal reflux disease)   . Hyperlipidemia   . Hypertension   . IDDM (insulin dependent diabetes mellitus) ([place]) 2000  . Osteoarthritis   . Perirectal fistula   . Pneumonia 12/2016  . Tubular adenoma of colon 07/2012  . Vertigo     Patient Active Problem List   Diagnosis Date Noted  . Gynecomastia, male 12/27/2017  . Bell's palsy 08/18/2017  . H/O cold sores 08/18/2017  . Bronchitis 11/29/2016  . Irregular heart beat 11/29/2016  . Insomnia 11/10/2016  . PLMD (periodic limb movement disorder) 08/17/2016  . Mild OSA 08/17/2016  . Intolerance of continuous positive airway pressure (CPAP) ventilation 08/17/2016   . Chronic arthritis 06/02/2016  . CKD (chronic kidney disease) stage 3, GFR 30-59 ml/min (HCC) 06/02/2016  . Unstable angina ([place]) 09/24/2015  . S/P CABG x 4   . Type 2 diabetes mellitus with complications ([place]) 32/44/0102  . Chronic diastolic CHF (congestive heart failure) ([place]) 01/01/2014  . CAD (coronary artery disease) 06/03/2013  . BPH (benign prostatic hyperplasia)   . GERD (gastroesophageal reflux disease)   . Hyperlipidemia 09/15/2009  . Essential hypertension 09/15/2009    Past Surgical History:  Procedure Laterality Date  . BACK SURGERY    . CARDIAC CATHETERIZATION  06/05/2013   [facility].   Marland Kitchen CARDIAC CATHETERIZATION N/A 09/24/2015   Procedure: Right Heart Cath and Coronary/Graft Angiography;  Surgeon: Minna Merritts, MD;  Location: [place] CV LAB;  Service: Cardiovascular;  Laterality: N/A;  . CATARACT EXTRACTION  sept 2013   right  . CATARACT EXTRACTION W/PHACO Left 03/28/2017   Procedure: CATARACT EXTRACTION PHACO AND INTRAOCULAR LENS PLACEMENT (IOC);  Surgeon: Birder Robson, MD;  Location: [facility] ORS;  Service: Ophthalmology;  Laterality: Left;  [place] 00:42.0 AP% 15.0 CDE 6.31 Fluid Pack lot # [id number]  . CHOLECYSTECTOMY  05/15/2011   Procedure: LAPAROSCOPIC CHOLECYSTECTOMY;  Surgeon: Rolm Bookbinder, MD;  Location: [place];  Service: General;  Laterality: N/A;  . COLONOSCOPY W/ POLYPECTOMY    . CORONARY ARTERY BYPASS GRAFT  2007   x 4  . FOOT SURGERY  2012   right foot  . JOINT REPLACEMENT  8/10   Right THR--[name]  .  LEFT AND RIGHT HEART CATHETERIZATION WITH CORONARY/GRAFT ANGIOGRAM N/A 06/05/2013   Procedure: LEFT AND RIGHT HEART CATHETERIZATION WITH Beatrix Fetters;  Surgeon: Peter M Martinique, MD;  Location: [facility] C[facility] LAB;  Service: Cardiovascular;  Laterality: N/A;  . LUMBAR LAMINECTOMY  1989  . Prostate photovaporization  5/16   Dr Budd Palmer    Allergies Cortisone and Metformin and related  Social History Social History   Tobacco Use   . Smoking status: Never Smoker  . Smokeless tobacco: Never Used  Substance Use Topics  . Alcohol use: Yes    Comment: occ cocktail  . Drug use: No   Review of Systems Constitutional: Negative for fever. Cardiovascular: Positive for chest pain Respiratory: Negative for shortness of breath. Gastrointestinal: Negative for abdominal pain, vomiting and diarrhea. Musculoskeletal: Negative for back pain. Skin: Negative for rash. Neurological: Negative for headaches, focal weakness or numbness.  All systems negative/normal/unremarkable except as stated in the HPI  ____________________________________________   PHYSICAL EXAM:  VITAL SIGNS: ED Triage Vitals  Enc Vitals Group     BP 01/07/18 2119 (!) 173/76     Pulse Rate 01/07/18 2119 67     Resp 01/07/18 2119 16     Temp 01/07/18 2119 98.2 F (36.8 C)     Temp Source 01/07/18 2119 Oral     SpO2 01/07/18 2119 98 %     Weight 01/07/18 2120 214 lb (97.1 kg)     Height 01/07/18 2120 6\' 2"  (1.88 m)     Head Circumference --      Peak Flow --      Pain Score 01/07/18 2120 5     Pain Loc --      Pain Edu? --      Excl. in [place]? --    Constitutional: Alert and oriented. Well appearing and in no distress. Eyes: Conjunctivae are normal. Normal extraocular movements. ENT   Head: Normocephalic and atraumatic.   Nose: No congestion/rhinnorhea.   Mouth/Throat: Mucous membranes are moist.   Neck: No stridor. Cardiovascular: Normal rate, regular rhythm. No murmurs, rubs, or gallops. Respiratory: Normal respiratory effort without tachypnea nor retractions. Breath sounds are clear and equal bilaterally. No wheezes/rales/rhonchi. Gastrointestinal: Soft and nontender. Normal bowel sounds Musculoskeletal: Nontender with normal range of motion in extremities. No lower extremity tenderness nor edema. Neurologic:  Normal speech and language. No gross focal neurologic deficits are appreciated.  Skin:  Skin is warm, dry and intact. No  rash noted. Psychiatric: Mood and affect are normal. Speech and behavior are normal.  ____________________________________________  EKG: Interpreted by me.  Sinus rhythm with sinus arrhythmia, rate of 64 bpm, right bundle branch block, normal QT  ____________________________________________  ED COURSE:  As part of my medical decision making, I reviewed the following data within the [place] History obtained from family if available, nursing notes, old chart and ekg, as well as notes from prior ED visits. Patient presented for hypertension but also burning left-sided chest pain, we will assess with labs and imaging as indicated at this time.   Procedures ____________________________________________   LABS (pertinent positives/negatives)  Labs Reviewed  BASIC METABOLIC PANEL - Abnormal; Notable for the following components:      Result Value   Glucose, Bld 335 (*)    Creatinine, Ser 1.39 (*)    Calcium 8.7 (*)    GFR calc non Af Amer 46 (*)    GFR calc Af Amer 53 (*)    All other components within normal limits  CBC -  Abnormal; Notable for the following components:   RBC 4.06 (*)    HCT 37.1 (*)    MCHC 36.4 (*)    Platelets 97 (*)    All other components within normal limits  TROPONIN I - Abnormal; Notable for the following components:   Troponin I 0.43 (*)    All other components within normal limits    RADIOLOGY Images were viewed by me  Does not reveal any acute process  ____________________________________________  DIFFERENTIAL DIAGNOSIS   Hypertensive urgency, post catheterization troponin elevation, unstable angina, shingles, PE, MI, pneumothorax  FINAL ASSESSMENT AND PLAN  Hypertension, elevated troponin, hyperglycemia   Plan: The patient had presented for persistent hypertension after his cardiac catheterization. Patient's labs did reveal troponin elevation which is likely just from post cardiac catheterization, however he has never had  troponin elevation before. Patient's imaging does not reveal any acute process.  I have given him IV hydralazine as his blood pressure was well over 200 and given him his nightly carvedilol.  He will need repeat troponin testing as well as likely cardiology consultation.   Laurence Aly, MD   Note: This note was generated in part or whole with voice recognition software. Voice recognition is usually quite accurate but there are transcription errors that can and very often do occur. I apologize for any typographical errors that were not detected and corrected.     Earleen Newport, MD 01/07/18 2241

## 2018-01-08 ENCOUNTER — Other Ambulatory Visit: Payer: Self-pay

## 2018-01-08 ENCOUNTER — Observation Stay (HOSPITAL_BASED_OUTPATIENT_CLINIC_OR_DEPARTMENT_OTHER)
Admit: 2018-01-08 | Discharge: 2018-01-08 | Disposition: A | Discharge status: Home / Self Care | Payer: Medicare Other | Attending: Physician Assistant | Admitting: Physician Assistant

## 2018-01-08 ENCOUNTER — Ambulatory Visit: Payer: Medicare Other

## 2018-01-08 ENCOUNTER — Encounter: Payer: Self-pay | Admitting: Cardiovascular Disease

## 2018-01-08 DIAGNOSIS — E119 Type 2 diabetes mellitus without complications: Secondary | ICD-10-CM | POA: Diagnosis not present

## 2018-01-08 DIAGNOSIS — I5030 Unspecified diastolic (congestive) heart failure: Secondary | ICD-10-CM | POA: Diagnosis not present

## 2018-01-08 DIAGNOSIS — I208 Other forms of angina pectoris: Secondary | ICD-10-CM

## 2018-01-08 DIAGNOSIS — I5032 Chronic diastolic (congestive) heart failure: Secondary | ICD-10-CM | POA: Diagnosis not present

## 2018-01-08 DIAGNOSIS — I10 Essential (primary) hypertension: Secondary | ICD-10-CM

## 2018-01-08 DIAGNOSIS — I2510 Atherosclerotic heart disease of native coronary artery without angina pectoris: Secondary | ICD-10-CM | POA: Diagnosis not present

## 2018-01-08 DIAGNOSIS — I248 Other forms of acute ischemic heart disease: Secondary | ICD-10-CM

## 2018-01-08 LAB — BASIC METABOLIC PANEL
Anion gap: 6 (ref 5–15)
BUN: 23 mg/dL (ref 8–23)
CO2: 25 mmol/L (ref 22–32)
Calcium: 8.4 mg/dL — ABNORMAL LOW (ref 8.9–10.3)
Chloride: 110 mmol/L (ref 98–111)
Creatinine, Ser: 1.3 mg/dL — ABNORMAL HIGH (ref 0.61–1.24)
GFR calc Af Amer: 57 mL/min — ABNORMAL LOW (ref >60)
GFR calc non Af Amer: 49 mL/min — ABNORMAL LOW (ref >60)
Glucose, Bld: 109 mg/dL — ABNORMAL HIGH (ref 70–99)
POTASSIUM: 3.6 mmol/L (ref 3.5–5.1)
SODIUM: 141 mmol/L (ref 135–145)

## 2018-01-08 LAB — GLUCOSE, CAPILLARY
GLUCOSE-CAPILLARY: 153 mg/dL — AB (ref 70–99)
GLUCOSE-CAPILLARY: 115 mg/dL — AB (ref 70–99)
GLUCOSE-CAPILLARY: 183 mg/dL — AB (ref 70–99)
Glucose-Capillary: 178 mg/dL — ABNORMAL HIGH (ref 70–99)
Glucose-Capillary: 295 mg/dL — ABNORMAL HIGH (ref 70–99)

## 2018-01-08 LAB — CBC
HCT: 35.2 % — ABNORMAL LOW (ref 39.0–52.0)
Hemoglobin: 12.5 g/dL — ABNORMAL LOW (ref 13.0–17.0)
MCH: 32.9 pg (ref 26.0–34.0)
MCHC: 35.5 g/dL (ref 30.0–36.0)
MCV: 92.6 fL (ref 80.0–100.0)
Platelets: 89 10*3/uL — ABNORMAL LOW (ref 150–400)
RBC: 3.8 MIL/uL — ABNORMAL LOW (ref 4.22–5.81)
RDW: 13.2 % (ref 11.5–15.5)
WBC: 6.7 10*3/uL (ref 4.0–10.5)
nRBC: 0 % (ref 0.0–0.2)

## 2018-01-08 LAB — ECHOCARDIOGRAM COMPLETE
Height: 74 in
Weight: 3424.01 oz

## 2018-01-08 LAB — TROPONIN I
Troponin I: 0.45 ng/mL (ref <0.03)
Troponin I: 0.5 ng/mL (ref <0.03)
Troponin I: 0.44 ng/mL (ref <0.03)

## 2018-01-08 LAB — MAGNESIUM: Magnesium: 2 mg/dL (ref 1.7–2.4)

## 2018-01-08 MED ORDER — HYDRALAZINE HCL 20 MG/ML IJ SOLN: 10.0000 mg | INTRAMUSCULAR | Status: DC | PRN

## 2018-01-08 MED ORDER — CARVEDILOL 12.5 MG PO TABS
12.5000 mg | ORAL_TABLET | Freq: Two times a day (BID) | ORAL | Status: DC
  Administered 2018-01-08 – 2018-01-09 (×3): 12.5 mg via ORAL
  Filled 2018-01-08 (×3): qty 1

## 2018-01-08 MED ORDER — ONDANSETRON HCL 4 MG/2ML IJ SOLN: 4.0000 mg | Freq: Four times a day (QID) | INTRAMUSCULAR | Status: DC | PRN

## 2018-01-08 MED ORDER — ASPIRIN EC 81 MG PO TBEC
81.0000 mg | DELAYED_RELEASE_TABLET | Freq: Every day | ORAL | Status: DC
  Administered 2018-01-08 – 2018-01-09 (×2): 81 mg via ORAL
  Filled 2018-01-08 (×2): qty 1

## 2018-01-08 MED ORDER — INSULIN ASPART 100 UNIT/ML ~~LOC~~ SOLN
0.0000 [IU] | Freq: Three times a day (TID) | SUBCUTANEOUS | Status: DC
  Administered 2018-01-08: 5 [IU] via SUBCUTANEOUS
  Administered 2018-01-09: 7 [IU] via SUBCUTANEOUS
  Administered 2018-01-09: 1 [IU] via SUBCUTANEOUS
  Filled 2018-01-08 (×3): qty 1

## 2018-01-08 MED ORDER — GABAPENTIN 400 MG PO CAPS
400.0000 mg | ORAL_CAPSULE | Freq: Two times a day (BID) | ORAL | Status: DC
  Administered 2018-01-08 – 2018-01-09 (×3): 400 mg via ORAL
  Filled 2018-01-08 (×2): qty 1
  Filled 2018-01-08: qty 4
  Filled 2018-01-08: qty 1

## 2018-01-08 MED ORDER — ONDANSETRON HCL 4 MG PO TABS: 4.0000 mg | ORAL_TABLET | Freq: Four times a day (QID) | ORAL | Status: DC | PRN

## 2018-01-08 MED ORDER — ROSUVASTATIN CALCIUM 10 MG PO TABS
20.0000 mg | ORAL_TABLET | Freq: Every evening | ORAL | Status: DC
  Administered 2018-01-08: 20 mg via ORAL
  Filled 2018-01-08: qty 2

## 2018-01-08 MED ORDER — TRAZODONE HCL 50 MG PO TABS
50.0000 mg | ORAL_TABLET | Freq: Every evening | ORAL | Status: DC | PRN
  Administered 2018-01-08: 50 mg via ORAL
  Filled 2018-01-08: qty 1

## 2018-01-08 MED ORDER — POTASSIUM CHLORIDE CRYS ER 20 MEQ PO TBCR
20.0000 meq | EXTENDED_RELEASE_TABLET | Freq: Two times a day (BID) | ORAL | Status: AC
  Administered 2018-01-08 (×2): 20 meq via ORAL
  Filled 2018-01-08 (×2): qty 1

## 2018-01-08 MED ORDER — INSULIN ASPART 100 UNIT/ML ~~LOC~~ SOLN
0.0000 [IU] | Freq: Four times a day (QID) | SUBCUTANEOUS | Status: DC
  Administered 2018-01-08: 2 [IU] via SUBCUTANEOUS
  Filled 2018-01-08: qty 1

## 2018-01-08 MED ORDER — FUROSEMIDE 20 MG PO TABS
20.0000 mg | ORAL_TABLET | Freq: Two times a day (BID) | ORAL | Status: DC
  Administered 2018-01-08 – 2018-01-09 (×3): 20 mg via ORAL
  Filled 2018-01-08 (×3): qty 1

## 2018-01-08 MED ORDER — PANTOPRAZOLE SODIUM 40 MG PO TBEC
40.0000 mg | DELAYED_RELEASE_TABLET | Freq: Every day | ORAL | Status: DC
  Administered 2018-01-08 – 2018-01-09 (×2): 40 mg via ORAL
  Filled 2018-01-08 (×2): qty 1

## 2018-01-08 MED ORDER — FINASTERIDE 5 MG PO TABS
5.0000 mg | ORAL_TABLET | Freq: Every day | ORAL | Status: DC
  Administered 2018-01-08: 5 mg via ORAL
  Filled 2018-01-08 (×2): qty 1

## 2018-01-08 MED ORDER — AMLODIPINE BESYLATE 5 MG PO TABS
5.0000 mg | ORAL_TABLET | Freq: Every day | ORAL | Status: DC
  Administered 2018-01-08: 5 mg via ORAL
  Filled 2018-01-08: qty 1

## 2018-01-08 MED ORDER — ACETAMINOPHEN 650 MG RE SUPP: 650.0000 mg | Freq: Four times a day (QID) | RECTAL | Status: DC | PRN

## 2018-01-08 MED ORDER — ACETAMINOPHEN 325 MG PO TABS: 650.0000 mg | ORAL_TABLET | Freq: Four times a day (QID) | ORAL | Status: DC | PRN

## 2018-01-08 MED ORDER — ENOXAPARIN SODIUM 40 MG/0.4ML ~~LOC~~ SOLN
40.0000 mg | SUBCUTANEOUS | Status: DC
  Administered 2018-01-08: 40 mg via SUBCUTANEOUS
  Filled 2018-01-08: qty 0.4

## 2018-01-08 MED ORDER — SODIUM CHLORIDE 0.9% FLUSH
3.0000 mL | Freq: Two times a day (BID) | INTRAVENOUS | Status: DC
  Administered 2018-01-08 – 2018-01-09 (×3): 3 mL via INTRAVENOUS

## 2018-01-08 MED ORDER — RAMIPRIL 10 MG PO CAPS
10.0000 mg | ORAL_CAPSULE | Freq: Two times a day (BID) | ORAL | Status: DC
  Administered 2018-01-08 – 2018-01-09 (×3): 10 mg via ORAL
  Filled 2018-01-08 (×5): qty 1

## 2018-01-08 MED ORDER — SODIUM CHLORIDE 0.9% FLUSH: 3.0000 mL | INTRAVENOUS | Status: DC | PRN

## 2018-01-08 NOTE — Care Management Note (Signed)
Case Management Note  Patient Details  Name: [name] [name] MRN: [id number] Date of Birth: [birth year]  Subjective/Objective:      Independent in all adls, denies issues accessing medical care, obtaining medications or with transportation.  Current with PCP.  No discharge needs identified at present by care manager or members of care team.                 Action/Plan:   Expected Discharge Date:                  Expected Discharge Plan:  Home/Self Care  In-House Referral:     Discharge planning Services  CM Consult  Post Acute Care Choice:    Choice offered to:     DME Arranged:    DME Agency:     HH Arranged:    [place] Agency:     Status of Service:  Completed, signed off  If discussed at [employer] of Stay Meetings, dates discussed:    Additional Comments:  Elza Rafter, RN 01/08/2018, 3:59 PM

## 2018-01-08 NOTE — Care Management Obs Status (Signed)
MEDICARE OBSERVATION STATUS NOTIFICATION   Patient Details  Name: [name] [name] MRN: [id number] Date of Birth: 1933-10-27   Medicare Observation Status Notification Given:  Yes    Elza Rafter, RN 01/08/2018, 3:57 PM

## 2018-01-08 NOTE — Consult Note (Addendum)
Cardiology Consultation:   Patient ID: [name] [name] MRN: [id number]; DOB: [birth year]  Admit date: 01/07/2018 Date of Consult: 01/08/2018  Primary Care Provider: [name], MD Primary Cardiologist: Dr. Rockey Situ Primary Electrophysiologist:  None    Patient Profile:   [name] [name] is a 84 y.o. male with a hx of CAD s/p L/R cath 01/05/18  and s/p 4v CABG 01/2005, HFpEF (EF 55-65% on 11/1), HTN, HLD, IDDM, obesity, GERD, BPH, s/p lumbar surgery 1989, chronic SOB, chronically low platelets, and h/o Bell's palsy who is being seen today for the evaluation of elevated troponin & SBP s/p L/RHC at the request of Dr. Jannifer Franklin.  History of Present Illness:   [name] [name] is an 84 yo male with PMH as above. 01/05/2018 L/R cardiac catheterization showed LIMA-LAD patent with no significant disease, VG-PDA patent with moderate mid vessel disease, focal calcification noted, VG-OM3 and VG-diagonal both occluded ostially. LVEF estimated 55-65%. Additional final conclusions of cath were documented as moderate pulmonary HTN by right heart pressures, severe 3v disease, occluded proximal RCA, occluded ostial LAD, occluded VG-OM3.   2015 echo and 2014 NM study copied in CV studies below, along with the above [facility] results.   Per Dr. Donivan Scull documentation 11/1, the patient had started biking and dietary changes with hemoglobin A1C recently 6.9. Due to elevated wedge pressure and SBP, lasix was increased to 50m po BID, amlodipine 567mpo qd started, and ramipril increased to 2031mo qd.  On 01/07/2018, he reportedly was checking his blood pressure at home [facility] his SBP read 217. He also reported unrelated left sided thoracic wall burning sensation that he has felt before and after his shingles shot several years ago. Last time he had this burning sensation, it lasted ~3 months. He reportedly received a shingles booster 2 months ago (11/2017) and stated his feelings  that this pain is related to that and not his SBP. The pain hurts when you press on it and is located along his T6 dermatome, stretching from anterior abdomen to left flank to left and central back. The burning does not change with position or deep breaths.   Patient reports medication compliance with the increased antihypertensive regimen as noted above.   01/07/18 & in the ED, SBP and troponin noted to be elevated and in the setting of recent cardiac catheterization.  Vitals: BP 210/100  173/76, HR 67 bpm, RR 16, T 98.59F,, SpO2 98% ORA Labs: Na 137, K 4.0, glucose 335, Cr 1.39 (baseline ~ 1.2), Ca 8.7, WBC 6.2, RBC 4.06, Hgb 13.5, HCT 37.1, plts 97 Troponin 0.43 EKG: 64bpm, RBBB, PVCs, poor r wave progression, baseline artifact CXR: No active cardiopulmonary disease Meds: IV hydralazine administered, started on Coreg, gabapentin dose increased   Patient was admitted for further management with cardiology was consulted.  Past Medical History:  Diagnosis Date  . BPH (benign prostatic hyperplasia)   . [place]  . Cataract   . Coronary artery disease    a. 4v CABG 01/2005; b. cath 06/05/13 showed patent grafts: LIMA-LAD, VG-D, VG-OM3, VG-dRCA, nl filling pressures, nl LV fxn  . Diastolic dysfunction    a. echo 4/28/6578l systolic fxn, mild LVH, diastolic relaxation abnormality, mildly enlarged LA, mild Ao insufficiency  . Dyspnea    with exertion  . ED (erectile dysfunction)   . GERD (gastroesophageal reflux disease)   . Hyperlipidemia   . Hypertension   . IDDM (insulin dependent diabetes  mellitus) ([place]) 2000  . Osteoarthritis   . Perirectal fistula   . Pneumonia 12/2016  . Tubular adenoma of colon 07/2012  . Vertigo     Past Surgical History:  Procedure Laterality Date  . BACK SURGERY    . CARDIAC CATHETERIZATION  06/05/2013   [facility]   Marland Kitchen CARDIAC CATHETERIZATION N/A 09/24/2015   Procedure: Right Heart Cath and Coronary/Graft Angiography;  Surgeon: Minna Merritts,  MD;  Location: [facility];  Service: Cardiovascular;  Laterality: N/A;  . CATARACT EXTRACTION  sept 2013   right  . CATARACT EXTRACTION W/PHACO Left 03/28/2017   Procedure: CATARACT EXTRACTION PHACO AND INTRAOCULAR LENS PLACEMENT (IOC);  Surgeon: Birder Robson, MD;  Location: [facility] ORS;  Service: Ophthalmology;  Laterality: Left;  [place] 00:42.0 AP% 15.0 CDE 6.31 Fluid Pack lot # [id number]  . CHOLECYSTECTOMY  05/15/2011   Procedure: LAPAROSCOPIC CHOLECYSTECTOMY;  Surgeon: Rolm Bookbinder, MD;  Location: [facility];  Service: General;  Laterality: N/A;  . COLONOSCOPY W/ POLYPECTOMY    . CORONARY ARTERY BYPASS GRAFT  2007   x 4  . FOOT SURGERY  2012   right foot  . JOINT REPLACEMENT  8/10   Right THR--[name]  . LEFT AND RIGHT HEART CATHETERIZATION WITH CORONARY/GRAFT ANGIOGRAM N/A 06/05/2013   Procedure: LEFT AND RIGHT HEART CATHETERIZATION WITH Beatrix Fetters;  Surgeon: Peter M Martinique, MD;  Location: [facility] C[facility];  Service: Cardiovascular;  Laterality: N/A;  . LUMBAR LAMINECTOMY  1989  . Prostate photovaporization  5/16   Dr Budd Palmer     Home Medications:  Prior to Admission medications   Medication Sig Start Date End Date Taking? Authorizing Provider  acetaminophen (TYLENOL) 500 MG tablet Take 1,000 mg by mouth 2 (two) times daily.   Yes [provider]  amLODipine (NORVASC) 5 MG tablet Take 1 tablet (5 mg total) by mouth daily. 01/05/18  Yes Minna Merritts, MD  aspirin EC 81 MG tablet Take 81 mg by mouth daily.   Yes [provider]  carvedilol (COREG) 12.5 MG tablet Take 1 tablet (12.5 mg total) by mouth 2 (two) times daily. 12/26/16  Yes Minna Merritts, MD  finasteride (PROSCAR) 5 MG tablet Take 1 tablet (5 mg total) by mouth daily. Patient taking differently: Take 5 mg by mouth at bedtime.  04/16/12  Yes Venia Carbon, MD  furosemide (LASIX) 20 MG tablet Take 1 tablet (20 mg total) by mouth 2 (two) times daily. 01/05/18  Yes Gollan,  Kathlene November, MD  gabapentin (NEURONTIN) 300 MG capsule Take 1 capsule (300 mg total) by mouth at bedtime. 12/26/17  Yes McLean-Scocuzza, Nino Glow, MD  omeprazole (PRILOSEC) 20 MG capsule Take 20 mg by mouth daily.   Yes [provider]  potassium chloride (K-DUR) 10 MEQ tablet Take 1 tablet (10 mEq total) by mouth 2 (two) times daily. 01/05/18  Yes Minna Merritts, MD  ramipril (ALTACE) 10 MG capsule Take 1 capsule (10 mg total) by mouth 2 (two) times daily. 01/05/18  Yes Gollan, Kathlene November, MD  rosuvastatin (CRESTOR) 20 MG tablet Take 1 tablet (20 mg total) by mouth daily. Patient taking differently: Take 20 mg by mouth every other day.  12/26/16  Yes Gollan, Kathlene November, MD  TOUJEO SOLOSTAR 300 UNIT/ML SOPN INJECT 50-60 UNITS INTO THE SKIN DAILY .INCREASE BY 1 UNITS/DAY UNTILFBS ARE BETWEEN 80-130. DO NOT EXCEED 60 UNITS PER DAY. Patient taking differently: Inject 46-48 Units into the skin daily.  09/21/17  Yes [name]  G, MD  traMADol (ULTRAM) 50 MG tablet Take 1 tablet (50 mg total) by mouth 2 (two) times daily as needed. Patient taking differently: Take 50 mg by mouth 2 (two) times daily as needed (for pain.).  12/26/17  Yes McLean-Scocuzza, Nino Glow, MD  traZODone (DESYREL) 50 MG tablet Take 1-2 tablets (50-100 mg total) by mouth at bedtime as needed for sleep. 11/09/16  Yes Cook, Jayce G, DO  vitamin B-12 (CYANOCOBALAMIN) 50 MCG tablet Take 50 mcg by mouth daily.   Yes [provider]  Vitamin D, Cholecalciferol, 1000 units TABS Take 1,000 Units by mouth daily.    Yes [provider]  BD INSULIN SYRINGE ULTRAFINE 31G X 15/64" 0.3 ML MISC USE AS DIRECTED 07/31/15   Viviana Simpler I, MD  blood glucose meter kit and supplies KIT OneTouch Verio w/ Device Kit OneTouch Verio VI STRP OneTouch Delica Lancet Dev misc OneTouch Delica Lancets 52W or Fine  Please check CBGs fasting and 2 other times during the day  Dx Code E11.8 08/18/17   [name], MD  Insulin Pen  Needle (BD PEN NEEDLE NANO U/F) 32G X 4 MM MISC USE AS DIRECTED 09/06/17   [name], MD  valACYclovir (VALTREX) 1000 MG tablet Take 1 tablet (1,000 mg total) by mouth 2 (two) times daily. X 1 week with food Patient not taking: Reported on 01/03/2018 12/26/17   McLean-Scocuzza, Nino Glow, MD    Inpatient Medications: Scheduled Meds: . amLODipine  5 mg Oral Daily  . aspirin EC  81 mg Oral Daily  . carvedilol  12.5 mg Oral BID  . enoxaparin (LOVENOX) injection  40 mg Subcutaneous Q24H  . finasteride  5 mg Oral QHS  . furosemide  20 mg Oral BID  . gabapentin  400 mg Oral BID  . insulin aspart  0-9 Units Subcutaneous Q6H  . pantoprazole  40 mg Oral Daily  . ramipril  10 mg Oral BID  . rosuvastatin  20 mg Oral QPM   Continuous Infusions:  PRN Meds: acetaminophen **OR** acetaminophen, hydrALAZINE, ondansetron **OR** ondansetron (ZOFRAN) IV, traZODone  Allergies:    Allergies  Allergen Reactions  . Cortisone Other (See Comments)    Reaction: Leg Cramps  . Metformin And Related Diarrhea    Social History:   Social History   Socioeconomic History  . Marital status: Married    Spouse name: Not on file  . Number of children: 4  . Years of education: Not on file  . Highest education level: Not on file  Occupational History  . 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 Financial resource strain: Not on file  . Food insecurity:    Worry: Not on file    Inability: Not on file  . Transportation needs:    Medical: Not on file    Non-medical: Not on file  Tobacco Use  . Smoking status: Never Smoker  . Smokeless tobacco: Never Used  Substance and Sexual Activity  . Alcohol use: Yes    Comment: occ cocktail  . Drug use: No  . Sexual activity: Not on file  Lifestyle  . Physical activity:    Days per week: Not on file    Minutes per session: Not on file  . Stress: Not on file  Relationships  . Social connections:     Talks on phone: Not on file    Gets together: Not on file    Attends religious service: Not  on file    Active member of club or organization: Not on file    Attends meetings of clubs or organizations: Not on file    Relationship status: Not on file  . Intimate partner violence:    Fear of current or ex partner: Not on file    Emotionally abused: Not on file    Physically abused: Not on file    Forced sexual activity: Not on file  Other Topics Concern  . Not on file  Social History Narrative   Goes by [name]   Has living will    Has designated [name] as health care POA   Would accept resuscitation attempts   Would not want tube feeds if cognitively unaware    Family History:    Family History  Problem Relation Age of Onset  . Lung cancer Mother   . Heart disease Father   . Colon cancer Neg Hx      ROS:  Please see the history of present illness.   All other ROS reviewed and negative.     Physical Exam/Data:   Vitals:   01/08/18 0100 01/08/18 0130 01/08/18 0137 01/08/18 0607  BP: 130/66 (!) 173/84  (!) 144/59  Pulse:  68  64  Resp: 19   16  Temp:   98.2 F (36.8 C) 98.2 F (36.8 C)  TempSrc:   Oral Oral  SpO2:  99%  95%  Weight:      Height:        Intake/Output Summary (Last 24 hours) at 01/08/2018 0739 Last data filed at 01/08/2018 0604 Gross per 24 hour  Intake -  Output 200 ml  Net -200 ml   Filed Weights   01/07/18 2120  Weight: 97.1 kg   Body mass index is 27.48 kg/m.  General:  Well nourished, well developed, in no acute distress. Wife sits beside him HEENT: normal Lymph: no adenopathy Neck: no JVD Vascular: No carotid bruits; FA pulses 2+ bilaterally without bruits  Cardiac:  normal S1, S2; RRR, extrasystole / PVC noted; no murmur  Lungs:  clear to auscultation bilaterally, no wheezing, rhonchi or rales  Abd: soft, TTP on left side, no hepatomegaly  Ext: no edema Musculoskeletal:  No deformities, BUE and BLE strength normal and  equal Skin: warm and dry  Neuro:  no focal abnormalities noted Psych:  Normal affect   EKG: Refer to HPI Telemetry:  Telemetry was personally reviewed and demonstrates: NSR, PVCs  CV Studies:   Relevant CV Studies:  01-18-18 R/LHC  Prox Cx lesion is 50% stenosed.  Origin lesion is 100% stenosed.  Origin lesion is 100% stenosed.  Ost LAD to Prox LAD lesion is 100% stenosed.  Mid Graft lesion is 55% stenosed.  Prox RCA to Dist RCA lesion is 100% stenosed.  Hemodynamic findings consistent with moderate pulmonary hypertension.   Left Anterior Descending  Ost LAD to Prox LAD lesion 100% stenosed  Ost LAD to Prox LAD lesion is 100% stenosed. The lesion is chronically occluded.  Left Circumflex  Prox Cx lesion 50% stenosed  Prox Cx lesion is 50% stenosed.  Right Coronary Artery  Prox RCA to Dist RCA lesion 100% stenosed  Prox RCA to Dist RCA lesion is 100% stenosed. The lesion is chronically occluded.  Graft to Dist Cx  Origin lesion 100% stenosed  Origin lesion is 100% stenosed. The lesion is chronically occluded.  Graft to 2nd Diag  Origin lesion 100% stenosed  Origin lesion is 100% stenosed.  LIMA  Graft to Mid LAD  Graft to Post Atrio  Mid Graft lesion 55% stenosed  Mid Graft lesion is 55% stenosed. The lesion is calcified.   12/24/2013 TTE Left ventricle: The cavity size was normal. Systolic function was normal. The estimated ejection fraction was in the range of 55% to 60%. Wall motion was normal; there were no regional wall motion abnormalities. Left ventricular diastolic function parameters were normal. - Aortic valve: There was mild regurgitation.   05/29/2012 NM  Overall Findings  Pharmacological myocardial perfusion imaging study with no significant  ischemia. EF estimated at 79%. No focal wall motion abnormalities. TID  0.87.  No significant EKG changes concerning for ischemia. Overall, this is a low  risk scan.  Pharmacological  myocardial perfusion image study with no significant  ischemia. EF estimated at 51%. TID 0.98. No focal wall motion  abnormalities.  There is mildly decreased perfusion in the basal to mid inferior wall at  stress and rest consistent with diaphragmatic attenuation artifact.  Significant GI uptake artifact also noted. Overall this is a low risk  scan.    Laboratory Data:  Chemistry Recent Labs  Lab 01/07/18 2128  NA 137  K 4.0  CL 107  CO2 24  GLUCOSE 335*  BUN 22  CREATININE 1.39*  CALCIUM 8.7*  GFRNONAA 46*  GFRAA 53*  ANIONGAP 6    No results for input(s): PROT, ALBUMIN, AST, ALT, ALKPHOS, BILITOT in the last 168 hours. Hematology Recent Labs  Lab 01/07/18 2128  WBC 6.2  RBC 4.06*  HGB 13.5  HCT 37.1*  MCV 91.4  MCH 33.3  MCHC 36.4*  RDW 13.3  PLT 97*   Cardiac Enzymes Recent Labs  Lab 01/07/18 2128 01/08/18 0216  TROPONINI 0.43* 0.45*   No results for input(s): TROPIPOC in the last 168 hours.  BNPNo results for input(s): BNP, PROBNP in the last 168 hours.  DDimer No results for input(s): DDIMER in the last 168 hours.  Radiology/Studies:  Dg Chest 1 View  Result Date: 01/07/2018 CLINICAL DATA:  Hypertension and burning pain around the left breast. Pain began with a shingle shot in September. Hypertension today. Cardiac catheterization on Friday. EXAM: CHEST  1 VIEW COMPARISON:  11/29/2016 FINDINGS: Postoperative changes in the mediastinum. Normal heart size and pulmonary vascularity. No focal airspace disease or consolidation in the lungs. No blunting of costophrenic angles. No pneumothorax. Mediastinal contours appear intact. Calcification of the aorta. IMPRESSION: No active disease. Electronically Signed   By: Lucienne Capers M.D.   On: 01/07/2018 22:40    Assessment and Plan:   Elevated troponin s/p recent 01/05/2018 L/RHC - 01/05/18 L/RHC as above in CV studies - Troponin 0.43  0.45  0.50; EKG with now complete RBBB, PVCs, baseline artifact - Upper  epigastric left sided pain that started with singles booster. Continue gabapentin and tylenol for L sided CP associated with shingles booster. Continue to monitor pain in setting of troponin elevation and with recent overnight drop in Hgb. If Hgb continues to drop, consider GI consult.  - Consider troponin elevation in setting of L/R cardiac catheterization, elevated SBP, uncontrolled DM, anemia. Continue to cycle troponin. - Cr 1.3 (baseline ~1.2). K 3.6. Hgb 12.5. - Possible cardiac cath later in this week. Heart healthy carb modified diet for now. Continue medical management as below with ASA, BB, Crestor, and ACEi. Will hold off on heparin given drop in Hgb overnight and low platelet count. Continue to watch Hgb - if it continues to drop, recommend further  workup for bleed.   - Will plan to update echo in setting of elevated SBP and troponin elevation. Will order. - Continue to monitor BP. Could consider renal artery ultrasound as SBP remains increased despite up-titration of antihypertensives; however, given the patient's body habitus, image quality may be poor. Consider increasing amlodipine as below to 38m daily to better control BP.    Anemia, Mild, chronic thrombocytopenia  - Hgb dropped overnight to 13.5  12.5. RBC 3.8 - Plts 97  89. Continue to monitor. - Continue to monitor. If continues to drop, consider workup by GI - Hold off on heparin for now, considering Hgb drop and low platelet count. On Lovenox - Daily CBC ordered  Hypokalemia - Potassium 4.0  3.6 - Recommend replete with goal 4.0. Will order KCl tab. - Recommend check Mg with goal 2.0 - Daily BMP. Will order.  HTN - SBP elevated at presentation and at home SBP 217, now controlled at 127/59 - Continue ramipril 1368mBID, amlodipine 68m27mo qd, Coreg 12.68mg66mD, hydralazine 10mg49m PRN for elevated SBP. Consider increasing home antihypertensives with amlodipine 10mg 71md. Consider renal ultrasound given elevated SBP despite  increased antihypertensives to r/o RAS but with consideration of body habitus as image quality may be poor.  CAD - L/RHC as above. Possible repeat cardiac cath later this week. See first dx recommendations above. Continue ASA,  blocker, crestor, ACEi as above. Continue to monitor vitals, CBC, BMP.  HFpEF - EF 55-65% per L/RHC [facility]2019 - Continue Crestor, ACEi as below - Pending updated echo - will order today  HLD - Continue Crestor 20mg p48m - 08/2017 LDL 41 and at goal - LDL goal <70.   Upper Left Epigastric pain - Reportedly started with shingles booster - Continue gabapentin, tylenol - Continue to monitor Hgb as below   IDDM with hyperglycemia at presentation - Poorly controlled - glucose 335 at presentation - SSI - Per IM    For questions or updates, please contact [facility] Please consult [email or web address] for contact info under     Signed, JacquelArvil Chaco 01/08/2018 7:39 AM

## 2018-01-08 NOTE — Progress Notes (Signed)
*  PRELIMINARY RESULTS* Echocardiogram 2D Echocardiogram has been performed.  [name] [name] 01/08/2018, 3:12 PM

## 2018-01-08 NOTE — Progress Notes (Signed)
[place] [facility] [place] NAME: [name] [name]    MR#:  [id number]  DATE OF BIRTH:  10/05/1933  SUBJECTIVE:   Patient came in with increasing blood pressure at home systolic was in the 681. No chest pain no shortness of breath. Blood pressure well controlled at present. REVIEW OF SYSTEMS:   Review of Systems  Constitutional: Negative for chills, fever and weight loss.  HENT: Negative for ear discharge, ear pain and nosebleeds.   Eyes: Negative for blurred vision, pain and discharge.  Respiratory: Negative for sputum production, shortness of breath, wheezing and stridor.   Cardiovascular: Negative for chest pain, palpitations, orthopnea and PND.  Gastrointestinal: Negative for abdominal pain, diarrhea, nausea and vomiting.  Genitourinary: Negative for frequency and urgency.  Musculoskeletal: Negative for back pain and joint pain.  Neurological: Negative for sensory change, speech change, focal weakness and weakness.  Psychiatric/Behavioral: Negative for depression and hallucinations. The patient is not nervous/anxious.    Tolerating Diet:yesTolerating PT: not needed DRUG ALLERGIES:   Allergies  Allergen Reactions  . Cortisone Other (See Comments)    Reaction: Leg Cramps  . Metformin And Related Diarrhea    VITALS:  Blood pressure (!) 148/55, pulse (!) 59, temperature 97.7 F (36.5 C), temperature source Oral, resp. rate 19, height 6\' 2"  (1.88 m), weight 97.1 kg, SpO2 99 %.  PHYSICAL EXAMINATION:   Physical Exam  GENERAL:  84 y.o.-year-old patient lying in the bed with no acute distress.  EYES: Pupils equal, round, reactive to light and accommodation. No scleral icterus. Extraocular muscles intact.  HEENT: Head atraumatic, normocephalic. Oropharynx and nasopharynx clear.  NECK:  Supple, no jugular venous distention. No thyroid enlargement, no tenderness.  LUNGS: Normal breath sounds bilaterally, no wheezing, rales, rhonchi. No  use of accessory muscles of respiration.  CARDIOVASCULAR: S1, S2 normal. No murmurs, rubs, or gallops.  ABDOMEN: Soft, nontender, nondistended. Bowel sounds present. No organomegaly or mass.  EXTREMITIES: No cyanosis, clubbing or edema b/l.    NEUROLOGIC: Cranial nerves II through XII are intact. No focal Motor or sensory deficits b/l.   PSYCHIATRIC:  patient is alert and oriented x 3.  SKIN: No obvious rash, lesion, or ulcer.   LABORATORY PANEL:  CBC Recent Labs  Lab 01/08/18 0714  WBC 6.7  HGB 12.5*  HCT 35.2*  PLT 89*    Chemistries  Recent Labs  Lab 01/08/18 0714 01/08/18 1323  NA 141  --   K 3.6  --   CL 110  --   CO2 25  --   GLUCOSE 109*  --   BUN 23  --   CREATININE 1.30*  --   CALCIUM 8.4*  --   MG  --  2.0   Cardiac Enzymes Recent Labs  Lab 01/08/18 1323  TROPONINI 0.44*   RADIOLOGY:  Dg Chest 1 View  Result Date: 01/07/2018 CLINICAL DATA:  Hypertension and burning pain around the left breast. Pain began with a shingle shot in September. Hypertension today. Cardiac catheterization on Friday. EXAM: CHEST  1 VIEW COMPARISON:  11/29/2016 FINDINGS: Postoperative changes in the mediastinum. Normal heart size and pulmonary vascularity. No focal airspace disease or consolidation in the lungs. No blunting of costophrenic angles. No pneumothorax. Mediastinal contours appear intact. Calcification of the aorta. IMPRESSION: No active disease. Electronically Signed   By: Lucienne Capers M.D.   On: 01/07/2018 22:40   ASSESSMENT AND PLAN:  Seven Dollens  is a 84 y.o. male who presents with  chief complaint as above.  Patient presents to the ED tonight due to severely elevated blood pressure.  He just had cardiac catheterization within the last 48 hours, which showed multiple significant blockages for which maximal medical management was recommended  1. accelerated hypertension -continue Lasix 20 mg BID, Norvasc, Coreg -IV PRN hydralazine -blood pressure much  improved  2. mild elevated troponin likely supply demand ischemia which is related to elevated blood pressure. -Per Dr. Fletcher Anon no indication for repeat cardiac cath. Patient just had cardiac cath 48 hours ago. Denies any chest pain. -Continue cardiac meds  3. Chronic diastolic heart failure patients Lasix has been increased to 20 mg BID after he was noted to be mildly volume overloaded based on results of right and left heart catheterization on Friday  4. Chronic stable angina cardiology recommending optimizing medical treatment  patient continues to stay stable including blood pressure and okay with cardiology will discharged him tomorrow  5. Mild thrombocytopenia I will hold off on Lovenox no active bleeding  Case discussed with Care Management/Social Worker. Management plans discussed with the patient, family and they are in agreement.  CODE STATUS: full  DVT Prophylaxis: SCD  TOTAL TIME TAKING CARE OF THIS PATIENT: 25 minutes.  >50% time spent on counselling and coordination of care  POSSIBLE D/C IN *1 to 2* DAYS, DEPENDING ON CLINICAL CONDITION.  Note: This dictation was prepared with Dragon dictation along with smaller phrase technology. Any transcriptional errors that result from this process are unintentional.  Fritzi Mandes M.D on 01/08/2018 at 6:03 PM  Between 7am to 6pm - Pager - [phone]  After 6pm go to [email or web address] - password [email or web address] [facility]  Office  [phone]  CC: Primary care physician; [name], MDPatient ID: [name] [name], male   DOB: June 26, 1933, 84 y.o.   MRN: [id number]

## 2018-01-09 ENCOUNTER — Telehealth: Payer: Self-pay

## 2018-01-09 DIAGNOSIS — I5032 Chronic diastolic (congestive) heart failure: Secondary | ICD-10-CM | POA: Diagnosis not present

## 2018-01-09 DIAGNOSIS — E119 Type 2 diabetes mellitus without complications: Secondary | ICD-10-CM | POA: Diagnosis not present

## 2018-01-09 DIAGNOSIS — I10 Essential (primary) hypertension: Secondary | ICD-10-CM | POA: Diagnosis not present

## 2018-01-09 DIAGNOSIS — I2510 Atherosclerotic heart disease of native coronary artery without angina pectoris: Secondary | ICD-10-CM | POA: Diagnosis not present

## 2018-01-09 LAB — CBC WITH DIFFERENTIAL/PLATELET
ABS IMMATURE GRANULOCYTES: 0.02 10*3/uL (ref 0.00–0.07)
Basophils Absolute: 0 10*3/uL (ref 0.0–0.1)
Basophils Relative: 1 %
Eosinophils Absolute: 0.2 10*3/uL (ref 0.0–0.5)
Eosinophils Relative: 3 %
HEMATOCRIT: 36.8 % — AB (ref 39.0–52.0)
Hemoglobin: 13.1 g/dL (ref 13.0–17.0)
Immature Granulocytes: 0 %
LYMPHS ABS: 1.5 10*3/uL (ref 0.7–4.0)
LYMPHS PCT: 21 %
MCH: 33 pg (ref 26.0–34.0)
MCHC: 35.6 g/dL (ref 30.0–36.0)
MCV: 92.7 fL (ref 80.0–100.0)
MONO ABS: 0.7 10*3/uL (ref 0.1–1.0)
MONOS PCT: 9 %
NEUTROS ABS: 4.6 10*3/uL (ref 1.7–7.7)
Neutrophils Relative %: 66 %
PLATELETS: 89 10*3/uL — AB (ref 150–400)
RBC: 3.97 MIL/uL — AB (ref 4.22–5.81)
RDW: 13.1 % (ref 11.5–15.5)
WBC: 7 10*3/uL (ref 4.0–10.5)
nRBC: 0 % (ref 0.0–0.2)

## 2018-01-09 LAB — GLUCOSE, CAPILLARY
Glucose-Capillary: 140 mg/dL — ABNORMAL HIGH (ref 70–99)
Glucose-Capillary: 322 mg/dL — ABNORMAL HIGH (ref 70–99)

## 2018-01-09 LAB — BASIC METABOLIC PANEL
ANION GAP: 4 — AB (ref 5–15)
BUN: 20 mg/dL (ref 8–23)
CALCIUM: 8.5 mg/dL — AB (ref 8.9–10.3)
CO2: 26 mmol/L (ref 22–32)
CREATININE: 1.31 mg/dL — AB (ref 0.61–1.24)
Chloride: 111 mmol/L (ref 98–111)
GFR, EST AFRICAN AMERICAN: 57 mL/min — AB (ref >60)
GFR, EST NON AFRICAN AMERICAN: 49 mL/min — AB (ref >60)
GLUCOSE: 156 mg/dL — AB (ref 70–99)
Potassium: 3.8 mmol/L (ref 3.5–5.1)
Sodium: 141 mmol/L (ref 135–145)

## 2018-01-09 MED ORDER — ROSUVASTATIN CALCIUM 20 MG PO TABS: 20.0000 mg | ORAL_TABLET | Freq: Every day | ORAL | 4 refills | Status: DC

## 2018-01-09 MED ORDER — AMLODIPINE BESYLATE 10 MG PO TABS
10.0000 mg | ORAL_TABLET | Freq: Every day | ORAL | Status: DC
  Administered 2018-01-09: 10 mg via ORAL
  Filled 2018-01-09: qty 1

## 2018-01-09 MED ORDER — AMLODIPINE BESYLATE 5 MG PO TABS: 5.0000 mg | ORAL_TABLET | Freq: Two times a day (BID) | ORAL | 3 refills | Status: DC

## 2018-01-09 NOTE — Discharge Instructions (Signed)
Keep log of blood pressure readings at home at least twice a day and discussed with Dr. Rockey Situ

## 2018-01-09 NOTE — Plan of Care (Signed)

## 2018-01-09 NOTE — Plan of Care (Signed)
Blood pressure remains elevated. No complaints of dizziness of headaches.

## 2018-01-09 NOTE — Telephone Encounter (Signed)
Copied from [place] [phone]. Topic: Appointment Scheduling - Scheduling Inquiry for Clinic >> Jan 09, 2018 10:51 AM Berneta Levins wrote: Reason for CRM:   [facility] calling to make hospital follow up appointment with Dr. [name], but there is nothing coming up soon.  Pt is being discharged today please call pt and schedule appointment [phone].

## 2018-01-09 NOTE — Discharge Summary (Signed)
[place] [facility] [place] NAME: Keavon Sensing    MR#:  [id number]  DATE OF BIRTH:  [birth year]  DATE OF ADMISSION:  01/07/2018 ADMITTING PHYSICIAN: Lance Coon, MD  DATE OF DISCHARGE: 01/09/2018  PRIMARY CARE PHYSICIAN: [name], MD    ADMISSION DIAGNOSIS:  Hypertensive urgency [I16.0] Elevated troponin I level [R79.89] Chest pain, unspecified type [R07.9]  DISCHARGE DIAGNOSIS:  malignant hypertension CAD status post recent cardiac cath-- continue medical management  SECONDARY DIAGNOSIS:   Past Medical History:  Diagnosis Date  . BPH (benign prostatic hyperplasia)   . Cancer ([place])    skin  . Cataract   . Coronary artery disease    a. 4v CABG 01/2005; b. cath 06/05/13 showed patent grafts: LIMA-LAD, VG-D, VG-OM3, VG-dRCA, nl filling pressures, nl LV fxn  . Diastolic dysfunction    a. echo 04/9935: nl systolic fxn, mild LVH, diastolic relaxation abnormality, mildly enlarged LA, mild Ao insufficiency  . Dyspnea    with exertion  . ED (erectile dysfunction)   . GERD (gastroesophageal reflux disease)   . Hyperlipidemia   . Hypertension   . IDDM (insulin dependent diabetes mellitus) ([place]) 2000  . Osteoarthritis   . Perirectal fistula   . Pneumonia 12/2016  . Tubular adenoma of colon 07/2012  . Vertigo     HOSPITAL COURSE:  [name] [name] a84 y.o.malewho presents with chief complaint as above. Patient presents to the ED tonight due to severely elevated blood pressure. He just had cardiac catheterization within the last 48 hours, which showed multiple significant blockages for which maximal medical management was recommended  1. accelerated hypertension -continue Lasix 20 mg BID, Norvasc, Coreg-- increased Norvasc to 5 mg bid -IV PRN hydralazine -blood pressure much improved  2. mild elevated troponin likely supply demand ischemia which is related to elevated blood pressure. -Per Dr. Fletcher Anon no indication for  repeat cardiac cath. Patient  had cardiac cath 48 hours ago. Denies any chest pain. -Continue cardiac meds  3. Chronic diastolic heart failure patients Lasix has been increased to 20 mg BID after he was noted to be mildly volume overloaded based on results of right and left heart catheterization on Friday  4. Chronic stable angina cardiology recommending optimizing medical treatment  patient continues to stay stable including blood pressure and okay with cardiology will discharged him tomorrow  5. Mild thrombocytopenia I will hold off on Lovenox no active bleeding.  Overall doing well. Will await cardiology recommendation regarding any change in medications. Discussed with patient and wife. Will discharge home later today. Follow-up with Dr. Rockey Situ on your scheduled appointment in November  CONSULTS OBTAINED:  Treatment Team:  Wellington Hampshire, MD  DRUG ALLERGIES:   Allergies  Allergen Reactions  . Cortisone Other (See Comments)    Reaction: Leg Cramps  . Metformin And Related Diarrhea    DISCHARGE MEDICATIONS:   Allergies as of 01/09/2018      Reactions   Cortisone Other (See Comments)   Reaction: Leg Cramps   Metformin And Related Diarrhea      Medication List    STOP taking these medications   valACYclovir 1000 MG tablet Commonly known as:  VALTREX     TAKE these medications   acetaminophen 500 MG tablet Commonly known as:  TYLENOL Take 1,000 mg by mouth 2 (two) times daily.   amLODipine 5 MG tablet Commonly known as:  NORVASC Take 1 tablet (5 mg total) by mouth 2 (two) times daily. What changed:  when to take this   aspirin EC 81 MG tablet Take 81 mg by mouth daily.   BD INSULIN SYRINGE ULTRAFINE 31G X 15/64" 0.3 ML Misc Generic drug:  Insulin Syringe-Needle U-100 USE AS DIRECTED   blood glucose meter kit and supplies Kit [employer] engineer, contracting w/ Device Kit OneTouch Verio VI STRP OneTouch Delica Lancet Dev misc OneTouch Delica Lancets 10R or  Fine  Please check CBGs fasting and 2 other times during the day  Dx Code E11.8   carvedilol 12.5 MG tablet Commonly known as:  COREG Take 1 tablet (12.5 mg total) by mouth 2 (two) times daily.   finasteride 5 MG tablet Commonly known as:  PROSCAR Take 1 tablet (5 mg total) by mouth daily. What changed:  when to take this   furosemide 20 MG tablet Commonly known as:  LASIX Take 1 tablet (20 mg total) by mouth 2 (two) times daily.   gabapentin 300 MG capsule Commonly known as:  NEURONTIN Take 1 capsule (300 mg total) by mouth at bedtime.   Insulin Pen Needle 32G X 4 MM Misc USE AS DIRECTED   omeprazole 20 MG capsule Commonly known as:  PRILOSEC Take 20 mg by mouth daily.   potassium chloride 10 MEQ tablet Commonly known as:  K-DUR Take 1 tablet (10 mEq total) by mouth 2 (two) times daily.   ramipril 10 MG capsule Commonly known as:  ALTACE Take 1 capsule (10 mg total) by mouth 2 (two) times daily.   rosuvastatin 20 MG tablet Commonly known as:  CRESTOR Take 1 tablet (20 mg total) by mouth daily. What changed:  when to take this   TOUJEO SOLOSTAR 300 UNIT/ML Sopn Generic drug:  Insulin Glargine INJECT 50-60 UNITS INTO THE SKIN DAILY .INCREASE BY 1 UNITS/DAY UNTILFBS ARE BETWEEN 80-130. DO NOT EXCEED 60 UNITS PER DAY. What changed:  See the new instructions.   traMADol 50 MG tablet Commonly known as:  ULTRAM Take 1 tablet (50 mg total) by mouth 2 (two) times daily as needed. What changed:  reasons to take this   traZODone 50 MG tablet Commonly known as:  DESYREL Take 1-2 tablets (50-100 mg total) by mouth at bedtime as needed for sleep.   vitamin B-12 50 MCG tablet Commonly known as:  CYANOCOBALAMIN Take 50 mcg by mouth daily.   Vitamin D (Cholecalciferol) 1000 units Tabs Take 1,000 Units by mouth daily.       If you experience worsening of your admission symptoms, develop shortness of breath, life threatening emergency, suicidal or homicidal thoughts  you must seek medical attention immediately by calling 911 or calling your MD immediately  if symptoms less severe.  You Must read complete instructions/literature along with all the possible adverse reactions/side effects for all the Medicines you take and that have been prescribed to you. Take any new Medicines after you have completely understood and accept all the possible adverse reactions/side effects.   Please note  You were cared for by a hospitalist during your hospital stay. If you have any questions about your discharge medications or the care you received while you were in the hospital after you are discharged, you can call the unit and asked to speak with the hospitalist on call if the hospitalist that took care of you is not available. Once you are discharged, your primary care physician will handle any further medical issues. Please note that NO REFILLS for any discharge medications will be authorized once you are discharged, as it is imperative  that you return to your primary care physician (or establish a relationship with a primary care physician if you do not have one) for your aftercare needs so that they can reassess your need for medications and monitor your lab values. Today   SUBJECTIVE  out of bed in the chair. Feeling a lot better.   VITAL SIGNS:  Blood pressure (!) 152/58, pulse (!) 57, temperature 98.3 F (36.8 C), temperature source Oral, resp. rate 18, height 6' 2"  (1.88 m), weight 97.3 kg, SpO2 97 %.  I/O:    Intake/Output Summary (Last 24 hours) at 01/09/2018 1049 Last data filed at 01/09/2018 1018 Gross per 24 hour  Intake 960 ml  Output 1650 ml  Net -690 ml    PHYSICAL EXAMINATION:  GENERAL:  84 y.o.-year-old patient lying in the bed with no acute distress.  EYES: Pupils equal, round, reactive to light and accommodation. No scleral icterus. Extraocular muscles intact.  HEENT: Head atraumatic, normocephalic. Oropharynx and nasopharynx clear.  NECK:   Supple, no jugular venous distention. No thyroid enlargement, no tenderness.  LUNGS: Normal breath sounds bilaterally, no wheezing, rales,rhonchi or crepitation. No use of accessory muscles of respiration.  CARDIOVASCULAR: S1, S2 normal. No murmurs, rubs, or gallops.  ABDOMEN: Soft, non-tender, non-distended. Bowel sounds present. No organomegaly or mass.  EXTREMITIES: No pedal edema, cyanosis, or clubbing.  NEUROLOGIC: Cranial nerves II through XII are intact. Muscle strength 5/5 in all extremities. Sensation intact. Gait not checked.  PSYCHIATRIC: The patient is alert and oriented x 3.  SKIN: No obvious rash, lesion, or ulcer.   DATA REVIEW:   CBC  Recent Labs  Lab 01/09/18 0434  WBC 7.0  HGB 13.1  HCT 36.8*  PLT 89*    Chemistries  Recent Labs  Lab 01/08/18 1323 01/09/18 0434  NA  --  141  K  --  3.8  CL  --  111  CO2  --  26  GLUCOSE  --  156*  BUN  --  20  CREATININE  --  1.31*  CALCIUM  --  8.5*  MG 2.0  --     Microbiology Results   No results found for this or any previous visit (from the past 240 hour(s)).  RADIOLOGY:  Dg Chest 1 View  Result Date: 01/07/2018 CLINICAL DATA:  Hypertension and burning pain around the left breast. Pain began with a shingle shot in September. Hypertension today. Cardiac catheterization on Friday. EXAM: CHEST  1 VIEW COMPARISON:  11/29/2016 FINDINGS: Postoperative changes in the mediastinum. Normal heart size and pulmonary vascularity. No focal airspace disease or consolidation in the lungs. No blunting of costophrenic angles. No pneumothorax. Mediastinal contours appear intact. Calcification of the aorta. IMPRESSION: No active disease. Electronically Signed   By: Lucienne Capers M.D.   On: 01/07/2018 22:40     Management plans discussed with the patient, family and they are in agreement.  CODE STATUS:     Code Status Orders  (From admission, onward)         Start     Ordered   01/08/18 0127  Full code  Continuous      01/08/18 0126        Code Status History    Date Active Date Inactive Code Status Order ID Comments User Context   01/05/2018 1615 01/05/2018 2034 Full Code [id number]  Minna Merritts, MD Inpatient   09/24/2015 1453 09/24/2015 1925 Full Code [id number]  Minna Merritts, MD Inpatient   06/05/2013 1646 06/06/2013  1801 Full Code [id number]  Martinique, Peter M, MD Inpatient   06/03/2013 2343 06/05/2013 1646 Full Code [id number]  Etta Quill, DO ED    Advance Directive Documentation     Most Recent Value  Type of Advance Directive  Healthcare Power of Attorney  Pre-existing out of facility DNR order (yellow form or pink MOST form)  -  "MOST" Form in Place?  -      TOTAL TIME TAKING CARE OF THIS PATIENT: *40* minutes.    Fritzi Mandes M.D on 01/09/2018 at 10:49 AM  Between 7am to 6pm - Pager - [phone] After 6pm go to [email or web address] - password [email or web address] [facility]  Office  [phone]  CC: Primary care physician; [name], MD

## 2018-01-10 NOTE — Telephone Encounter (Signed)
There is an 11:00 appointment open on 01/15/2018.  I have blocked this for this patient.

## 2018-01-10 NOTE — Telephone Encounter (Signed)
No appointments available but 3:15 please advise where to place.

## 2018-01-11 NOTE — Telephone Encounter (Signed)
BP noted. He needs to check his BP once daily varying the times he checks it. If it continues to run that high he needs to be evaluated prior to his follow-up visit. If he develops symptoms he needs to be evaluated. Thanks.

## 2018-01-11 NOTE — Telephone Encounter (Signed)
Transition Care Management Follow-up Telephone Call  How have you been since you were released from the hospital? Feeling better , but still says his BP is running about 178/80 at times, this is the highest reading since being home advised patient I would send to PCP and if he experienced any symptoms with high reading he needs to be evaluated sooner. At this time patient states he feels fine.   Do you understand why you were in the hospital? yes   Do you understand the discharge instrcutions? yes  Items Reviewed:  Medications reviewed: yes  Allergies reviewed: yes  Dietary changes reviewed: yes  Referrals reviewed: yes   Functional Questionnaire:   Activities of Daily Living (ADLs):   He states they are independent in the following: ambulation, bathing and hygiene, feeding, continence, grooming, toileting and dressing States they require assistance with the following: dressing and no assistance required   Any transportation issues/concerns?: no   Any patient concerns? no   Confirmed importance and date/time of follow-up visits scheduled: yes   Confirmed with patient if condition begins to worsen call PCP or go to the ER.  Patient was given the Call-a-Nurse line [phone]: yes

## 2018-01-11 NOTE — Telephone Encounter (Signed)
Patient notified and voiced understanding.

## 2018-01-15 ENCOUNTER — Encounter: Payer: Self-pay | Admitting: Family Medicine

## 2018-01-15 ENCOUNTER — Ambulatory Visit (INDEPENDENT_AMBULATORY_CARE_PROVIDER_SITE_OTHER): Payer: Medicare Other | Admitting: Family Medicine

## 2018-01-15 VITALS — BP 172/60 | HR 72 | Temp 97.8°F | Ht 74.0 in | Wt 217.4 lb

## 2018-01-15 DIAGNOSIS — N6459 Other signs and symptoms in breast: Secondary | ICD-10-CM

## 2018-01-15 DIAGNOSIS — M792 Neuralgia and neuritis, unspecified: Secondary | ICD-10-CM

## 2018-01-15 DIAGNOSIS — I491 Atrial premature depolarization: Secondary | ICD-10-CM | POA: Diagnosis not present

## 2018-01-15 DIAGNOSIS — I10 Essential (primary) hypertension: Secondary | ICD-10-CM

## 2018-01-15 DIAGNOSIS — I499 Cardiac arrhythmia, unspecified: Secondary | ICD-10-CM | POA: Diagnosis not present

## 2018-01-15 DIAGNOSIS — I25118 Atherosclerotic heart disease of native coronary artery with other forms of angina pectoris: Secondary | ICD-10-CM

## 2018-01-15 LAB — BASIC METABOLIC PANEL
BUN: 28 mg/dL — ABNORMAL HIGH (ref 6–23)
CHLORIDE: 105 meq/L (ref 96–112)
CO2: 24 meq/L (ref 19–32)
Calcium: 8.6 mg/dL (ref 8.4–10.5)
Creatinine, Ser: 1.39 mg/dL (ref 0.40–1.50)
GFR: 51.97 mL/min — ABNORMAL LOW (ref >60.00)
Glucose, Bld: 353 mg/dL — ABNORMAL HIGH (ref 70–99)
Potassium: 4.5 mEq/L (ref 3.5–5.1)
SODIUM: 136 meq/L (ref 135–145)

## 2018-01-15 NOTE — Patient Instructions (Addendum)
Nice to see you. Please start taking the amlodipine 5 mg twice daily. We will get a mammogram ordered for you. Please follow-up with cardiology as planned. If you develop any chest pain or worsening breathing issues or your blood pressure goes up even further please be evaluated.

## 2018-01-17 DIAGNOSIS — Z961 Presence of intraocular lens: Secondary | ICD-10-CM | POA: Diagnosis not present

## 2018-01-18 DIAGNOSIS — N644 Mastodynia: Secondary | ICD-10-CM | POA: Insufficient documentation

## 2018-01-18 DIAGNOSIS — N6459 Other signs and symptoms in breast: Secondary | ICD-10-CM | POA: Insufficient documentation

## 2018-01-18 DIAGNOSIS — M792 Neuralgia and neuritis, unspecified: Secondary | ICD-10-CM | POA: Insufficient documentation

## 2018-01-18 DIAGNOSIS — I491 Atrial premature depolarization: Secondary | ICD-10-CM | POA: Insufficient documentation

## 2018-01-18 NOTE — Assessment & Plan Note (Signed)
Patient's description of the stinging sensation after shingles vaccine is consistent with neuropathic pain.  I find it unlikely that this would be related to the Shingrix though the patient does not want to proceed with further vaccination.  He will monitor.  He notes this has happened in the past with the Zostavax and improved after about 90 days.

## 2018-01-18 NOTE — Assessment & Plan Note (Signed)
Remains uncontrolled.  Discussed appropriate dosing of amlodipine twice daily.  He will continue his other medications.  He will follow-up in 1 week for nurse blood pressure check.  He will monitor his blood pressure at home.

## 2018-01-18 NOTE — Assessment & Plan Note (Signed)
There is a slight difference between the left and the right breast.  There is no specific palpable gynecomastia or lesions.  We will obtain a mammogram and ultrasounds.

## 2018-01-18 NOTE — Assessment & Plan Note (Signed)
Medical management.  Symptoms are stable.  He will follow with cardiology.

## 2018-01-18 NOTE — Assessment & Plan Note (Signed)
Noted on exam.  EKG with PACs.  He will follow-up with cardiology.

## 2018-01-18 NOTE — Progress Notes (Signed)
[name], MD Phone: [phone]  [name] [name] is a 84 y.o. male who presents today for hospital follow-up.  CC: Hypertensive urgency  Patient was hospitalized from 01/07/2018-01/09/2018 for hypertensive urgency and elevated troponin.  Patient presented the emergency department with severely elevated blood pressure.  He had just had a cardiac catheterization within the prior 48 hours which showed multiple blockages for which maximal medical management was recommended.  He developed elevated blood pressure.  He was admitted and continued on Lasix, Norvasc, and carvedilol.  His Norvasc was increased to 5 mg twice daily.  He received as needed IV hydralazine.  His blood pressure was improved at discharge.  Mildly elevated troponin was likely related to supply demand ischemia due to his elevated blood pressure.  Cardiology evaluated him and found no indication for repeat cardiac catheterization.  He was discharged home.  He has done well since discharge.  He has not had any chest pain.  He has chronic dyspnea on exertion that is unchanged.  He notes he has only been taking the amlodipine once daily.  His blood pressure has been running up around 174 systolically.  He also reports a possible adverse reaction to the shingles vaccine where he developed a bee sting-like sensation from his sternum over his left lower ribs.  He was also noted during her previous office visit to complain of left chest enlargement compared to the right.  He has had no pain.  He has had no apparent mass lesion.  It was recommended for him to have a mammogram.  Discharge summary reviewed.  Medications reviewed.  Social History   Tobacco Use  Smoking Status Never Smoker  Smokeless Tobacco Never Used     ROS see history of present illness  Objective  Physical Exam Vitals:   01/15/18 1110  BP: (!) 172/60  Pulse: 72  Temp: 97.8 F (36.6 C)  SpO2: 98%    BP Readings from Last 3 Encounters:  01/15/18 (!)  172/60  01/09/18 (!) 152/58  01/05/18 (!) 159/55   Wt Readings from Last 3 Encounters:  01/15/18 217 lb 6.4 oz (98.6 kg)  01/09/18 214 lb 9.6 oz (97.3 kg)  01/05/18 214 lb (97.1 kg)    Physical Exam  Constitutional: No distress.  Cardiovascular: Normal rate, regular rhythm and normal heart sounds.  Pulmonary/Chest: Effort normal and breath sounds normal.  Musculoskeletal: He exhibits no edema.  Left breast appears slightly larger than the right, there is no palpable underlying mass lesion or tenderness in either breast, there is no gynecomastia in either breast  Neurological: He is alert.  Skin: Skin is warm and dry. He is not diaphoretic.     Assessment/Plan: Please see individual problem list.  Essential hypertension Remains uncontrolled.  Discussed appropriate dosing of amlodipine twice daily.  He will continue his other medications.  He will follow-up in 1 week for nurse blood pressure check.  He will monitor his blood pressure at home.  Abnormal breast exam There is a slight difference between the left and the right breast.  There is no specific palpable gynecomastia or lesions.  We will obtain a mammogram and ultrasounds.  Irregular heart beat Noted on exam.  EKG with PACs.  He will follow-up with cardiology.  Neuropathic pain Patient's description of the stinging sensation after shingles vaccine is consistent with neuropathic pain.  I find it unlikely that this would be related to the Shingrix though the patient does not want to proceed with further vaccination.  He will  monitor.  He notes this has happened in the past with the Zostavax and improved after about 90 days.  CAD (coronary artery disease) Medical management.  Symptoms are stable.  He will follow with cardiology.   Orders Placed This Encounter  Procedures  . MM DIAG BREAST TOMO BILATERAL    Standing Status:   Future    Standing Expiration Date:   03/21/2019    Order Specific Question:   Reason for Exam  (SYMPTOM  OR DIAGNOSIS REQUIRED)    Answer:   left chest enlargement compared to right, no masses or tenderness on exam    Order Specific Question:   Preferred imaging location?    Answer:   [facility]  . US BREAST LTD UNI RIGHT INC AXILLA    Standing Status:   Future    Standing Expiration Date:   03/21/2019    Order Specific Question:   Reason for Exam (SYMPTOM  OR DIAGNOSIS REQUIRED)    Answer:   Left breast enlargement compared to the right, no masses or tenderness on exam    Order Specific Question:   Preferred imaging location?    Answer:   [facility]  . US BREAST LTD UNI LEFT INC AXILLA    Standing Status:   Future    Standing Expiration Date:   03/21/2019    Order Specific Question:   Reason for Exam (SYMPTOM  OR DIAGNOSIS REQUIRED)    Answer:   Left breast enlargement compared to the right, no masses or tenderness on exam    Order Specific Question:   Preferred imaging location?    Answer:   [facility]  . Basic metabolic panel  . EKG 12-Lead    No orders of the defined types were placed in this encounter.    [name], MD [place]

## 2018-01-20 ENCOUNTER — Other Ambulatory Visit: Payer: Self-pay | Admitting: Internal Medicine

## 2018-01-23 ENCOUNTER — Ambulatory Visit: Payer: Medicare Other | Admitting: *Deleted

## 2018-01-23 VITALS — BP 128/68 | HR 67 | Resp 18

## 2018-01-23 DIAGNOSIS — I10 Essential (primary) hypertension: Secondary | ICD-10-CM

## 2018-01-23 NOTE — Progress Notes (Signed)
Patient here for nurse visit BP check per order from 01/15/18  Patient reports compliance with prescribed BP medications: yes  Last dose of BP medication:   BP Readings from Last 3 Encounters:  01/23/18 130/70  01/15/18 (!) 172/60  01/09/18 (!) 152/58   Pulse Readings from Last 3 Encounters:  01/23/18 64  01/15/18 72  01/09/18 (!) 57      Patient verbalized understanding of instructions.   Kerin Salen, LPN

## 2018-01-23 NOTE — Progress Notes (Signed)
BP looks wonderful today. Stay on current medications.

## 2018-01-24 NOTE — Progress Notes (Signed)
Patient notified and voiced understanding.

## 2018-01-30 NOTE — Progress Notes (Signed)
Cardiology Office Note  Date:  01/31/2018   ID:  [name] [name], DOB 1934-02-17, MRN [id number]  PCP:  [name] Haven, MD   Chief Complaint  Patient presents with  . other    Pt. c/o shortness of breath. Meds reviewed by the pt. verbally.     HPI:  84 year old gentleman with a history of  coronary artery disease, bypass surgery in November 2006, Stress test March 2017, cath 09/2015, medical management Occluded proximal LAD  Critical/severe native RCA disease  Vein graft to the diagonal is occluded  Other vein grafts patent to the RCA/PDA, vein graft to the OM 3, LIMA graft  EF 55% in 12/2013 diabetes, numbers running high obesity,  hyperlipidemia,  hypertension,  erectile dysfunction Chronic SOB who presents for routine followup of his coronary artery disease.  He follows up today after recent cardiac catheterization Cardiac catheterization January 05, 2018 Right heart pressures RA pressure mean 16 RV pressure 48/11,18 PA pressure 51/22 mean 32 Wedge pressure mean 21  Final Conclusions:  Moderate pulmonary hypertension by right heart pressures Severe three-vessel disease Occluded proximal RCA, occluded ostial LAD LIMA graft to the LAD patent, vein graft to the PDA patent The findings this catheterization shows occluded vein graft to the OM 3, now occluded proximal native RCA  Recommendations:  Review of previous notes shows elevated hemoglobin A1c in the 8 range, recently 6.9 He has started biking, recommended he continue his exercise program, dietary changes to maintain hemoglobin A1c in the 6 range Cholesterol is at goal Wedge pressure elevated, will increase Lasix up to 20 twice daily Blood pressure elevated we will start amlodipine 5 mg daily, Lasix 20 twice daily increase ramipril to 20 daily  Echocardiogram January 08, 2018 Left ventricle: The cavity size was normal. There was mild   concentric hypertrophy. Systolic function was normal. The    estimated ejection fraction was in the range of 55% to 60%. Wall   motion was normal; there were no regional wall motion   abnormalities. Features are consistent with a pseudonormal left   ventricular filling pattern, with concomitant abnormal relaxation   and increased filling pressure (grade 2 diastolic dysfunction). - Aortic valve: There was trivial regurgitation. - Mitral valve: Calcified annulus. - Left atrium: The atrium was mildly dilated. - Pulmonary arteries: Systolic pressure was mildly increased.  After discharge from the hospital went back into the hospital with high blood pressure  Sugars running high, since catheterization  exercising 30 min on bike Still with some shortness of breath, denies any chest pain Previously taking Crestor every other day, now taking Crestor daily  Provide some blood pressure measurements from home, all of these are elevated typically 295 up to 188 systolic He is checking his blood pressure before taking his morning medications Blood pressure today 124 on first check systolic on my check 416 systolic Rare episodes of dizziness  Problems with insomnia  Taking ramipril twice daily, amlodipine twice daily, Coreg twice daily, Lasix twice a day  EKG personally reviewed by myself on todays visit Shows normal sinus rhythm rate 59 bpm no significant ST or T wave changes  Other past medical history reviewed Chronic shortness of breath symptoms, deconditioning Again continues to have shortness of breath with exertion on walking, some night sweats Started on gabapentin, Valtrex twice daily for burning symptoms left chest Also has tramadol  Prior stress test March 2017 small fixed defect anteroseptal location, apical region  History of Bell's palsy, Left side of face March 2019  2 days after doing cataract surgery Completed prednisone and antivirals Improved sx, still difficulty smoking, right facial weakness  PFT mild to moderate  obstruction  Chronic Joint pain, shoulder Leg cramps at night at times, worse with cortisone  Chronic hand, back pain  EKG personally reviewed by myself on todays visit sinus rhythm with rate 64 bpm no significant ST or T-wave changes  Other past medical history reviewed Previous echocardiogram in 2015 which was essentially normal  he denies any leg edema  CT scan of his abdomen reviewed showing mild diffuse aortic atherosclerosis Review of lab work showing elevated glucose levels but does report hemoglobin A1c 6.9  Seen in the past by specialist for his back in [place], had MRI showing arthritis no spinal stenosis  catheterization for chest pain 06/05/2013 that showed patent grafts with LIMA to the LAD, vein graft to the diagonal, vein graft to the OM 3, vein graft to the distal RCA. Normal filling pressures and normal LV function.  Echocardiogram April 2009 shows normal systolic function with mild LVH, diastolic relaxation abnormality, mildly enlarged left atrium, mild aortic insufficiency. Cardiac CT scan in March 2009 showed severe coronary artery disease with patent grafts x4, saphenous vein graft to the PDA, OM, diagonal and a LIMA graft to the LAD.   PMH:   has a past medical history of BPH (benign prostatic hyperplasia), Cancer ([place]), Cataract, Coronary artery disease, Diastolic dysfunction, Dyspnea, ED (erectile dysfunction), GERD (gastroesophageal reflux disease), Hyperlipidemia, Hypertension, IDDM (insulin dependent diabetes mellitus) ([place]) (2000), Osteoarthritis, Perirectal fistula, Pneumonia (12/2016), Tubular adenoma of colon (07/2012), and Vertigo.  PSH:    Past Surgical History:  Procedure Laterality Date  . BACK SURGERY    . CARDIAC CATHETERIZATION  06/05/2013   [facility].   Marland Kitchen CARDIAC CATHETERIZATION N/A 09/24/2015   Procedure: Right Heart Cath and Coronary/Graft Angiography;  Surgeon: Minna Merritts, MD;  Location: [place] [facility];  Service:  Cardiovascular;  Laterality: N/A;  . CATARACT EXTRACTION  sept 2013   right  . CATARACT EXTRACTION W/PHACO Left 03/28/2017   Procedure: CATARACT EXTRACTION PHACO AND INTRAOCULAR LENS PLACEMENT (IOC);  Surgeon: Birder Robson, MD;  Location: [facility] ORS;  Service: Ophthalmology;  Laterality: Left;  [place] 00:42.0 AP% 15.0 CDE 6.31 Fluid Pack lot # [id number]  . CHOLECYSTECTOMY  05/15/2011   Procedure: LAPAROSCOPIC CHOLECYSTECTOMY;  Surgeon: Rolm Bookbinder, MD;  Location: [place];  Service: General;  Laterality: N/A;  . COLONOSCOPY W/ POLYPECTOMY    . CORONARY ARTERY BYPASS GRAFT  2007   x 4  . FOOT SURGERY  2012   right foot  . JOINT REPLACEMENT  8/10   Right THR--[name]  . LEFT AND RIGHT HEART CATHETERIZATION WITH CORONARY/GRAFT ANGIOGRAM N/A 06/05/2013   Procedure: LEFT AND RIGHT HEART CATHETERIZATION WITH Beatrix Fetters;  Surgeon: Peter M Martinique, MD;  Location: [facility] CATH LAB;  Service: Cardiovascular;  Laterality: N/A;  . LUMBAR LAMINECTOMY  1989  . Prostate photovaporization  5/16   Dr Budd Palmer  . RIGHT/LEFT HEART CATH AND CORONARY/GRAFT ANGIOGRAPHY N/A 01/05/2018   Procedure: RIGHT/LEFT HEART CATH AND CORONARY/GRAFT ANGIOGRAPHY;  Surgeon: Minna Merritts, MD;  Location: [place] [facility];  Service: Cardiovascular;  Laterality: N/A;    Current Outpatient Medications  Medication Sig Dispense Refill  . acetaminophen (TYLENOL) 500 MG tablet Take 1,000 mg by mouth 2 (two) times daily.    Marland Kitchen amLODipine (NORVASC) 5 MG tablet Take 1 tablet (5 mg total) by mouth 2 (two) times daily. 90 tablet 3  .  aspirin EC 81 MG tablet Take 81 mg by mouth daily.    . BD INSULIN SYRINGE ULTRAFINE 31G X 15/64" 0.3 ML MISC USE AS DIRECTED 100 each 3  . blood glucose meter kit and supplies KIT OneTouch Verio w/ Device Kit OneTouch Verio VI STRP OneTouch Delica Lancet Dev misc OneTouch Delica Lancets 63J or Fine  Please check CBGs fasting and 2 other times during the day  Dx Code E11.8 1 each  0  . carvedilol (COREG) 12.5 MG tablet Take 1 tablet (12.5 mg total) by mouth 2 (two) times daily. 180 tablet 4  . finasteride (PROSCAR) 5 MG tablet Take 1 tablet (5 mg total) by mouth daily. (Patient taking differently: Take 5 mg by mouth at bedtime. ) 90 tablet 1  . furosemide (LASIX) 20 MG tablet Take 1 tablet (20 mg total) by mouth 2 (two) times daily. 180 tablet 3  . gabapentin (NEURONTIN) 300 MG capsule Take 1 capsule (300 mg total) by mouth at bedtime. 30 capsule 11  . Insulin Pen Needle (BD PEN NEEDLE NANO U/F) 32G X 4 MM MISC USE AS DIRECTED 100 each 11  . omeprazole (PRILOSEC) 20 MG capsule TAKE 1 CAPSULE BY MOUTH TWICE DAILY 60 capsule 2  . potassium chloride (K-DUR) 10 MEQ tablet Take 1 tablet (10 mEq total) by mouth 2 (two) times daily. 180 tablet 3  . ramipril (ALTACE) 10 MG capsule Take 1 capsule (10 mg total) by mouth 2 (two) times daily. 180 capsule 4  . rosuvastatin (CRESTOR) 20 MG tablet Take 1 tablet (20 mg total) by mouth daily. 90 tablet 4  . TOUJEO SOLOSTAR 300 UNIT/ML SOPN INJECT 50-60 UNITS INTO THE SKIN DAILY .INCREASE BY 1 UNITS/DAY UNTILFBS ARE BETWEEN 80-130. DO NOT EXCEED 60 UNITS PER DAY. (Patient taking differently: Inject 46-48 Units into the skin daily. ) 4.5 mL 6  . traMADol (ULTRAM) 50 MG tablet Take 1 tablet (50 mg total) by mouth 2 (two) times daily as needed. (Patient taking differently: Take 50 mg by mouth 2 (two) times daily as needed (for pain.). ) 10 tablet 0  . traZODone (DESYREL) 50 MG tablet Take 1-2 tablets (50-100 mg total) by mouth at bedtime as needed for sleep. 90 tablet 3  . vitamin B-12 (CYANOCOBALAMIN) 50 MCG tablet Take 50 mcg by mouth daily.    . Vitamin D, Cholecalciferol, 1000 units TABS Take 1,000 Units by mouth daily.      No current facility-administered medications for this visit.     Allergies:   Cortisone and Metformin and related   Social History:  The patient  reports that he has never smoked. He has never used smokeless tobacco.  He reports that he drinks alcohol. He reports that he does not use drugs.   Family History:   family history includes Heart disease in his father; Lung cancer in his mother.    Review of Systems: Review of Systems  Constitutional: Negative.   Respiratory: Positive for shortness of breath.   Cardiovascular: Negative.   Gastrointestinal: Negative.   Musculoskeletal: Negative.   Neurological: Negative.   All other systems reviewed and are negative.    PHYSICAL EXAM: VS:  BP 124/60 (BP Location: Left Arm, Patient Position: Sitting, Cuff Size: Normal)   Pulse (!) 59   Ht 6' 2" (1.88 m)   Wt 217 lb 8 oz (98.7 kg)   BMI 27.93 kg/m  , BMI Body mass index is 27.93 kg/m. Constitutional:  oriented to person, place, and time. No  distress.  HENT:  Head: Grossly normal Eyes:  no discharge. No scleral icterus.  Neck: No JVD, no carotid bruits  Cardiovascular: Regular rate and rhythm, no murmurs appreciated Pulmonary/Chest: Clear to auscultation bilaterally, no wheezes or rails Abdominal: Soft.  no distension.  no tenderness.  Musculoskeletal: Normal range of motion Neurological:  normal muscle tone. Coordination normal. No atrophy Skin: Skin warm and dry Psychiatric: normal affect, pleasant   Recent Labs: 12/27/2017: ALT 12; TSH 1.38 01/08/2018: Magnesium 2.0 01/09/2018: Hemoglobin 13.1; Platelets 89 01/15/2018: BUN 28; Creatinine, Ser 1.39; Potassium 4.5; Sodium 136    Lipid Panel Lab Results  Component Value Date   CHOL 125 12/27/2017   HDL 31.00 (L) 08/18/2017   LDLCALC 41 08/18/2017   TRIG 289 (H) 12/27/2017      Wt Readings from Last 3 Encounters:  01/31/18 217 lb 8 oz (98.7 kg)  01/15/18 217 lb 6.4 oz (98.6 kg)  01/09/18 214 lb 9.6 oz (97.3 kg)      ASSESSMENT AND PLAN:  Unstable angina, known CAD/CABG Chronic stable shortness of breath  Recent cardiac catheterization, medical management recommended Stressed importance of aggressive diabetes control, Continue  Crestor daily  Chronic diastolic CHF (congestive heart failure) ([place]) - Plan: EKG 12-Lead Continue Lasix with potassium twice a day Repeat basic metabolic panel in 2 to 3 weeks time  SOB (shortness of breath) - Plan: EKG 12-Lead Chronic issue, recommended weight loss, conditioning with regular exercise  Hypertension Markedly elevated on recent cardiac catheterization Numbers improved on ramipril twice daily Coreg twice daily amlodipine twice daily with extra Lasix Suggested he monitor blood pressure closely at home His blood pressure is 15 points higher than manual check today  Bell's palsy Completed prednisone and antivirals Prior history of  shingles   Total encounter time more than 25 minutes  Greater than 50% was spent in counseling and coordination of care with the patient  Disposition:   F/U  6 month   Orders Placed This Encounter  Procedures  . EKG 12-Lead     Signed, Esmond Plants, M.D., Ph.D. 01/31/2018  [place], [place]

## 2018-01-31 ENCOUNTER — Ambulatory Visit: Payer: Medicare Other

## 2018-01-31 ENCOUNTER — Ambulatory Visit: Admission: RE | Admit: 2018-01-31 | Payer: Medicare Other | Source: Ambulatory Visit

## 2018-01-31 ENCOUNTER — Ambulatory Visit: Payer: Medicare Other | Admitting: Cardiovascular Disease

## 2018-01-31 ENCOUNTER — Encounter

## 2018-01-31 ENCOUNTER — Encounter: Payer: Self-pay | Admitting: Cardiovascular Disease

## 2018-01-31 VITALS — BP 124/60 | HR 59 | Ht 74.0 in | Wt 217.5 lb

## 2018-01-31 DIAGNOSIS — E118 Type 2 diabetes mellitus with unspecified complications: Secondary | ICD-10-CM

## 2018-01-31 DIAGNOSIS — Z951 Presence of aortocoronary bypass graft: Secondary | ICD-10-CM | POA: Diagnosis not present

## 2018-01-31 DIAGNOSIS — I5032 Chronic diastolic (congestive) heart failure: Secondary | ICD-10-CM

## 2018-01-31 DIAGNOSIS — I25118 Atherosclerotic heart disease of native coronary artery with other forms of angina pectoris: Secondary | ICD-10-CM | POA: Diagnosis not present

## 2018-01-31 DIAGNOSIS — I10 Essential (primary) hypertension: Secondary | ICD-10-CM | POA: Diagnosis not present

## 2018-01-31 DIAGNOSIS — E782 Mixed hyperlipidemia: Secondary | ICD-10-CM

## 2018-01-31 DIAGNOSIS — R0602 Shortness of breath: Secondary | ICD-10-CM

## 2018-01-31 MED ORDER — DOXAZOSIN MESYLATE 2 MG PO TABS: 2.0000 mg | ORAL_TABLET | Freq: Two times a day (BID) | ORAL | 11 refills | Status: DC

## 2018-01-31 NOTE — Patient Instructions (Addendum)
Medication Instructions:   Monitor blood pressure late morning and afternoon  If you need a refill on your cardiac medications before your next appointment, please call your pharmacy.    Lab work: BMP in 2 to 3 weeks   If you have labs (blood work) drawn today and your tests are completely normal, you will receive your results only by: Marland Kitchen MyChart Message (if you have MyChart) OR . A paper copy in the mail If you have any lab test that is abnormal or we need to change your treatment, we will call you to review the results.   Testing/Procedures: No new testing needed   Follow-Up: At [facility], you and your health needs are our priority.  As part of our continuing mission to provide you with exceptional heart care, we have created designated Provider Care Teams.  These Care Teams include your primary Cardiologist (physician) and Advanced Practice Providers (APPs -  Physician Assistants and Nurse Practitioners) who all work together to provide you with the care you need, when you need it.  . You will need a follow up appointment in 6 month .   Please call our office 2 months in advance to schedule this appointment.    . Providers on your designated Care Team:   . [name], NP . Christell Faith, PA-C . Marrianne Mood, PA-C  Any Other Special Instructions Will Be Listed Below (If Applicable).  For educational health videos Log in to : [email or web address] Or : [email or web address], password : [email or web address]

## 2018-02-05 ENCOUNTER — Ambulatory Visit: Payer: Medicare Other | Admitting: Pharmacist

## 2018-02-05 DIAGNOSIS — Z794 Long term (current) use of insulin: Secondary | ICD-10-CM | POA: Diagnosis not present

## 2018-02-05 DIAGNOSIS — E118 Type 2 diabetes mellitus with unspecified complications: Secondary | ICD-10-CM | POA: Diagnosis not present

## 2018-02-12 ENCOUNTER — Telehealth: Payer: Self-pay | Admitting: Cardiovascular Disease

## 2018-02-12 NOTE — Telephone Encounter (Signed)
Called and spoke with patient States that he has been keeping a list of his blood pressure readings Will be coming in on 12/12 for labs and will bring his readings Once reviewed please advise if 1/6 appointment with Dr. Rockey Situ should be rescheduled

## 2018-02-12 NOTE — Telephone Encounter (Signed)
-----   Message from Rudi Coco sent at 01/31/2018  2:30 PM EST ----- Can you please call patient, since you checked him out   ----- Message ----- From: Minna Merritts, MD Sent: 01/31/2018   2:04 PM EST To: [facility] [place] Scheduling  Can we call patient and let him know if blood pressure is running okay he does not need a 1 month follow-up We would be able to push it back further, possibly even 6 months if numbers look okay He can call us with blood pressure measurements thx TG

## 2018-02-13 ENCOUNTER — Other Ambulatory Visit: Payer: Self-pay | Admitting: Family Medicine

## 2018-02-15 ENCOUNTER — Telehealth: Payer: Self-pay | Admitting: Family Medicine

## 2018-02-15 ENCOUNTER — Other Ambulatory Visit: Payer: Medicare Other

## 2018-02-15 NOTE — Telephone Encounter (Signed)
This patient had a diagnostic mammogram and [place] ordered previously. Could you get this set up for him? Thanks.

## 2018-02-16 NOTE — Telephone Encounter (Signed)
Noted. Please contact the patient to reinforce that there is the potential that there is an underlying cause for the breast enlargement such as a cancer that would be picked up on mammogram. It is always up to him if he wants to proceed with this though I would recommend completing the mammogram. Thanks.

## 2018-02-16 NOTE — Telephone Encounter (Signed)
Spoke to pt. He wishes to hold off on rescheduling his mammogram at this time. He would like to watch it for a while and will let me know when he wants to reschedule.

## 2018-02-16 NOTE — Telephone Encounter (Signed)
He was scheduled for 11/27 and I spoke to him regarding this. I'll let him know to call to reschedule this. Thanks [employer]

## 2018-02-16 NOTE — Telephone Encounter (Signed)
Called and spoke with patient. Pt stated that he is willing to do the MM and reschedule.

## 2018-02-16 NOTE — Telephone Encounter (Signed)
I will forward to Melissa to get the patient rescheduled for his mammogram.

## 2018-02-23 ENCOUNTER — Telehealth: Payer: Self-pay | Admitting: *Deleted

## 2018-02-23 DIAGNOSIS — I25118 Atherosclerotic heart disease of native coronary artery with other forms of angina pectoris: Secondary | ICD-10-CM

## 2018-02-23 NOTE — Telephone Encounter (Signed)
-----   Message from Britt Bottom, [place] sent at 02/22/2018  3:09 PM EST ----- Pt has upcoming appointment 03/12/2017. Pt 1 month f/u is pending prior to him coming in for labs which he no showed and was to bring BP readings. Please advise if ok for pt to keep appointment. Please see telephone note/pt had labs with PCP as well.  Thank you, Lenda Kelp

## 2018-02-23 NOTE — Telephone Encounter (Signed)
Spoke with patient and reviewed that he had missed his previous appointment for labs to be done here in our office and to bring in his blood pressure list. He apologized for missing appointment and did not recall having that date written down anywhere. Advised that we really need to check his labs and obtain his blood pressure list before upcoming appointment for provider to review. He verbalized understanding, was agreeable to go next week for labs to be done, and will also drop off his blood pressure list to [place] at that time. He had no further questions and was appreciative for the call.

## 2018-03-02 ENCOUNTER — Other Ambulatory Visit
Admission: RE | Admit: 2018-03-02 | Discharge: 2018-03-02 | Disposition: A | Discharge status: Home / Self Care | Payer: Medicare Other | Source: Ambulatory Visit | Attending: Cardiovascular Disease | Admitting: Cardiovascular Disease

## 2018-03-02 DIAGNOSIS — I25118 Atherosclerotic heart disease of native coronary artery with other forms of angina pectoris: Secondary | ICD-10-CM | POA: Insufficient documentation

## 2018-03-02 LAB — BASIC METABOLIC PANEL
Anion gap: 8 (ref 5–15)
BUN: 24 mg/dL — AB (ref 8–23)
CHLORIDE: 110 mmol/L (ref 98–111)
CO2: 22 mmol/L (ref 22–32)
Calcium: 8.8 mg/dL — ABNORMAL LOW (ref 8.9–10.3)
Creatinine, Ser: 1.47 mg/dL — ABNORMAL HIGH (ref 0.61–1.24)
GFR calc Af Amer: 51 mL/min — ABNORMAL LOW (ref >60)
GFR, EST NON AFRICAN AMERICAN: 44 mL/min — AB (ref >60)
Glucose, Bld: 210 mg/dL — ABNORMAL HIGH (ref 70–99)
POTASSIUM: 4.6 mmol/L (ref 3.5–5.1)
Sodium: 140 mmol/L (ref 135–145)

## 2018-03-05 ENCOUNTER — Encounter: Payer: Self-pay | Admitting: Family Medicine

## 2018-03-05 ENCOUNTER — Ambulatory Visit (INDEPENDENT_AMBULATORY_CARE_PROVIDER_SITE_OTHER): Payer: Medicare Other | Admitting: Family Medicine

## 2018-03-05 DIAGNOSIS — M545 Low back pain, unspecified: Secondary | ICD-10-CM

## 2018-03-05 DIAGNOSIS — I10 Essential (primary) hypertension: Secondary | ICD-10-CM | POA: Diagnosis not present

## 2018-03-05 DIAGNOSIS — E782 Mixed hyperlipidemia: Secondary | ICD-10-CM | POA: Diagnosis not present

## 2018-03-05 DIAGNOSIS — N6459 Other signs and symptoms in breast: Secondary | ICD-10-CM

## 2018-03-05 DIAGNOSIS — N183 Chronic kidney disease, stage 3 unspecified: Secondary | ICD-10-CM

## 2018-03-05 DIAGNOSIS — S61419A Laceration without foreign body of unspecified hand, initial encounter: Secondary | ICD-10-CM

## 2018-03-05 DIAGNOSIS — G8929 Other chronic pain: Secondary | ICD-10-CM

## 2018-03-05 NOTE — Assessment & Plan Note (Signed)
Continue Crestor 

## 2018-03-05 NOTE — Assessment & Plan Note (Signed)
Discussed this diagnosis with the patient.  Advised to avoid NSAIDs.  We will periodically monitor.

## 2018-03-05 NOTE — Patient Instructions (Addendum)
Nice to see you. Please monitor your skin wound on your right middle finger.  If you develop spreading redness or drainage of pus please be evaluated. Please see your cardiologist as planned. We will have you return in a week for blood pressure check with nursing.  Please bring your blood pressure cuff to that visit. We will get you set up for your mammogram.

## 2018-03-05 NOTE — Assessment & Plan Note (Signed)
Stable.  Unchanged.  He will continue as needed Tylenol.

## 2018-03-05 NOTE — Assessment & Plan Note (Signed)
Well-controlled today.  He will continue his current regimen and follow-up with cardiology as planned next week.  He will return for nurse visit and bring his blood pressure cuff in.  We will try to determine if his blood pressure cuff is accurate.

## 2018-03-05 NOTE — Progress Notes (Signed)
[name], MD Phone: [phone]  [name] [name] is a 84 y.o. male who presents today for follow-up.  CC: Hyperlipidemia, chronic low back pain, left chest enlargement, hypertension, right finger injury  Hyperlipidemia: He is taking Crestor.  No right upper quadrant pain or myalgias.  Chronic low back pain: Notes he had surgery in the late 80s on his back.  He is had back pain since then.  He notes his left leg has been weaker since the late 80s when he had his surgery.  He notes no change in weakness.  No numbness.  No incontinence.  No radiation.  He takes Tylenol which does help dull the pain.  Left chest enlargement: Patient was due to have a mammogram to evaluate this further though he showed up slightly late for his appointment and they would not complete it.  He is willing to undergo this.  Hypertension: Patient notes he is checking it at home and is running in the 664Q on systolic blood pressure.  He is taking amlodipine, carvedilol, ramipril, and Lasix.  He notes no chest pain.  He notes chronic stable dyspnea on exertion that is unchanged.  He notes chronic stable edema in his left ankle.  Right finger injury: Patient reports on 12/24 he bumped his right middle finger dorsum of his proximal phalanx and took some skin off.  He has had no spreading erythema or drainage.  No fevers.  He has been using antibiotic ointment and placing a bandage on it.  Social History   Tobacco Use  Smoking Status Never Smoker  Smokeless Tobacco Never Used     ROS see history of present illness  Objective  Physical Exam Vitals:   03/05/18 1045  BP: 112/60  Pulse: 63  Temp: 98.3 F (36.8 C)  SpO2: 97%    BP Readings from Last 3 Encounters:  03/05/18 112/60  01/31/18 124/60  01/23/18 128/68   Wt Readings from Last 3 Encounters:  03/05/18 219 lb 9.6 oz (99.6 kg)  01/31/18 217 lb 8 oz (98.7 kg)  01/15/18 217 lb 6.4 oz (98.6 kg)    Physical Exam Constitutional:    General: He is not in acute distress.    Appearance: He is not diaphoretic.  Cardiovascular:     Rate and Rhythm: Normal rate and regular rhythm.     Heart sounds: Normal heart sounds.  Pulmonary:     Effort: Pulmonary effort is normal.     Breath sounds: Normal breath sounds.  Skin:    General: Skin is warm and dry.     Comments: Skin tear over the dorsum of his right middle finger proximal phalanx, there is very minimal surrounding erythema with no tenderness, warmth, induration, or drainage, no foreign body noted  Neurological:     Mental Status: He is alert.     Comments: 5/5 strength bilateral quads, hamstrings, plantar flexions, and dorsiflexion, sensation light touch intact bilateral lower extremities      Assessment/Plan: Please see individual problem list.  Essential hypertension Well-controlled today.  He will continue his current regimen and follow-up with cardiology as planned next week.  He will return for nurse visit and bring his blood pressure cuff in.  We will try to determine if his blood pressure cuff is accurate.  CKD (chronic kidney disease) stage 3, GFR 30-59 ml/min Discussed this diagnosis with the patient.  Advised to avoid NSAIDs.  We will periodically monitor.  Abnormal breast exam Mammogram has been ordered.  I discussed with the  referral coordinator and she will work on getting this rescheduled today.  Hyperlipidemia Continue Crestor.  Chronic low back pain Stable.  Unchanged.  He will continue as needed Tylenol.  Skin tear of hand without complication, initial encounter Patient with a mild skin tear noted on his right middle finger.  The minimal erythema is likely a reaction to the skin tear.  There are no signs of infection.  He will wash with soap and water and can use antibiotic ointment as well as a Band-Aid.  They will monitor it.  He will be reevaluated if there are signs of infection.     No orders of the defined types were placed in this  encounter.   No orders of the defined types were placed in this encounter.    [name], MD [place]

## 2018-03-05 NOTE — Assessment & Plan Note (Signed)
Mammogram has been ordered.  I discussed with the referral coordinator and she will work on getting this rescheduled today.

## 2018-03-05 NOTE — Assessment & Plan Note (Signed)
Patient with a mild skin tear noted on his right middle finger.  The minimal erythema is likely a reaction to the skin tear.  There are no signs of infection.  He will wash with soap and water and can use antibiotic ointment as well as a Band-Aid.  They will monitor it.  He will be reevaluated if there are signs of infection.

## 2018-03-08 ENCOUNTER — Telehealth: Payer: Self-pay | Admitting: *Deleted

## 2018-03-08 NOTE — Telephone Encounter (Signed)
-----   Message from Minna Merritts, MD sent at 03/04/2018 12:12 PM EST ----- Bmp result Renal function mildly elevated Would suggest he skip lasix one day a week We will call back with changes to BP

## 2018-03-08 NOTE — Telephone Encounter (Signed)
Reviewed results and recommendations with patient. He brought in blood pressure readings which are ranging 150-170's over 60-70's. He states that his meter at home is roughly 10-12 points higher than our office. He has appointment coming up on Monday with Dr. Rockey Situ and will bring his list of BP readings in for that appointment as well. Instructed him to hold his furosemide for one whole day and he verbalized understanding with no further questions at this time. Confirmed appointment for next week.

## 2018-03-10 NOTE — Progress Notes (Signed)
Cardiology Office Note  Date:  03/12/2018   ID:  [name] [name], DOB October 04, 1933, MRN [id number]  PCP:  [name], MD   Chief Complaint  Patient presents with  . other    1 month follow up. Meds reviewed by the pt. verbally. Pt. c/o shortness of breath.     HPI:  85 year old gentleman with a history of  coronary artery disease, bypass surgery in November 2006, Stress test March 2017, cath 09/2015, medical management Occluded proximal LAD  Critical/severe native RCA disease  Vein graft to the diagonal is occluded  Other vein grafts patent to the RCA/PDA, vein graft to the OM 3, LIMA graft  EF 55% in 12/2013 diabetes, numbers running high obesity,  hyperlipidemia,  hypertension,  erectile dysfunction Chronic SOB Postherpetic neuralgia left flank Stable angina, chronic diastolic CHF, chronic shortness of breath who presents for routine followup of his coronary artery disease.  BP elevated at home, but better in the doctor's offices here and with primary care Reports having continued stable anginal symptoms, shortness of breath on exertion Weight continues to run high, reports that he does his exercise bike 3 days a week  Recent lab work reviewed with him BMP: cr and BUN running elevated Told to stop Lasix 1 day a week   Report continues to work on his diet Hemoglobin A1c 6.9  Following catheterization Lasix increased up to twice daily, started on amlodipine, ramipril for better blood pressure control  Tolerating statin daily  EKG personally reviewed by myself on todays visit Shows normal sinus rhythm with rate 61 bpm right bundle branch block no significant ST or T wave changes  Other past medical history reviewed  recent cardiac catheterization Cardiac catheterization January 05, 2018 Moderate pulmonary hypertension by right heart pressures Severe three-vessel disease Occluded proximal RCA, occluded ostial LAD LIMA graft to the LAD patent, vein graft to  the PDA patent The findings this catheterization shows occluded vein graft to the OM 3, now occluded proximal native RCA  Echocardiogram January 08, 2018 Left ventricle: The cavity size was normal. There was mild   concentric hypertrophy. Systolic function was normal. The   estimated ejection fraction was in the range of 55% to 60%. Wall   motion was normal; there were no regional wall motion   abnormalities. Features are consistent with a pseudonormal left   ventricular filling pattern, with concomitant abnormal relaxation   and increased filling pressure (grade 2 diastolic dysfunction). - Aortic valve: There was trivial regurgitation. - Mitral valve: Calcified annulus. - Left atrium: The atrium was mildly dilated. - Pulmonary arteries: Systolic pressure was mildly increased.  Provide some blood pressure measurements from home, all of these are elevated typically 762 up to 831 systolic He is checking his blood pressure before taking his morning medications Blood pressure today 124 on first check systolic on my check 517 systolic Rare episodes of dizziness  Problems with insomnia  Prior stress test March 2017 small fixed defect anteroseptal location, apical region  History of Bell's palsy, Left side of face March 2019  2 days after doing cataract surgery Completed prednisone and antivirals Improved sx, still difficulty smoking, right facial weakness  PFT mild to moderate obstruction  Chronic Joint pain, shoulder Leg cramps at night at times, worse with cortisone  Chronic hand, back pain  CT scan of his abdomen reviewed showing mild diffuse aortic atherosclerosis Review of lab work showing elevated glucose levels but does report hemoglobin A1c 6.9  Seen in the past  by specialist for his back in [place], had MRI showing arthritis no spinal stenosis  catheterization for chest pain 06/05/2013 that showed patent grafts with LIMA to the LAD, vein graft to the diagonal, vein  graft to the OM 3, vein graft to the distal RCA. Normal filling pressures and normal LV function.  Echocardiogram April 2009 shows normal systolic function with mild LVH, diastolic relaxation abnormality, mildly enlarged left atrium, mild aortic insufficiency. Cardiac CT scan in March 2009 showed severe coronary artery disease with patent grafts x4, saphenous vein graft to the PDA, OM, diagonal and a LIMA graft to the LAD.   PMH:   has a past medical history of BPH (benign prostatic hyperplasia), Cancer ([place]), Cataract, Coronary artery disease, Diastolic dysfunction, Dyspnea, ED (erectile dysfunction), GERD (gastroesophageal reflux disease), Hyperlipidemia, Hypertension, IDDM (insulin dependent diabetes mellitus) ([place]) (2000), Osteoarthritis, Perirectal fistula, Pneumonia (12/2016), Tubular adenoma of colon (07/2012), and Vertigo.  PSH:    Past Surgical History:  Procedure Laterality Date  . BACK SURGERY    . CARDIAC CATHETERIZATION  06/05/2013   [facility].   Marland Kitchen CARDIAC CATHETERIZATION N/A 09/24/2015   Procedure: Right Heart Cath and Coronary/Graft Angiography;  Surgeon: Minna Merritts, MD;  Location: [place] [facility];  Service: Cardiovascular;  Laterality: N/A;  . CATARACT EXTRACTION  sept 2013   right  . CATARACT EXTRACTION W/PHACO Left 03/28/2017   Procedure: CATARACT EXTRACTION PHACO AND INTRAOCULAR LENS PLACEMENT (IOC);  Surgeon: Birder Robson, MD;  Location: [facility];  Service: Ophthalmology;  Laterality: Left;  [place] 00:42.0 AP% 15.0 CDE 6.31 Fluid Pack lot # [id number]  . CHOLECYSTECTOMY  05/15/2011   Procedure: LAPAROSCOPIC CHOLECYSTECTOMY;  Surgeon: Rolm Bookbinder, MD;  Location: [facility];  Service: General;  Laterality: N/A;  . COLONOSCOPY W/ POLYPECTOMY    . CORONARY ARTERY BYPASS GRAFT  2007   x 4  . FOOT SURGERY  2012   right foot  . JOINT REPLACEMENT  8/10   Right THR--[name]  . LEFT AND RIGHT HEART CATHETERIZATION WITH CORONARY/GRAFT ANGIOGRAM N/A 06/05/2013    Procedure: LEFT AND RIGHT HEART CATHETERIZATION WITH Beatrix Fetters;  Surgeon: Peter M Martinique, MD;  Location: [facility];  Service: Cardiovascular;  Laterality: N/A;  . LUMBAR LAMINECTOMY  1989  . Prostate photovaporization  5/16   Dr Budd Palmer  . RIGHT/LEFT HEART CATH AND CORONARY/GRAFT ANGIOGRAPHY N/A 01/05/2018   Procedure: RIGHT/LEFT HEART CATH AND CORONARY/GRAFT ANGIOGRAPHY;  Surgeon: Minna Merritts, MD;  Location: [place] [facility];  Service: Cardiovascular;  Laterality: N/A;    Current Outpatient Medications  Medication Sig Dispense Refill  . acetaminophen (TYLENOL) 500 MG tablet Take 1,000 mg by mouth 2 (two) times daily.    Marland Kitchen amLODipine (NORVASC) 5 MG tablet Take 1 tablet (5 mg total) by mouth 2 (two) times daily. 90 tablet 3  . aspirin EC 81 MG tablet Take 81 mg by mouth daily.    . BD INSULIN SYRINGE ULTRAFINE 31G X 15/64" 0.3 ML MISC USE AS DIRECTED 100 each 3  . blood glucose meter kit and supplies KIT OneTouch Verio w/ Device Kit OneTouch Verio VI STRP OneTouch Delica Lancet Dev misc OneTouch Delica Lancets 35H or Fine  Please check CBGs fasting and 2 other times during the day  Dx Code E11.8 1 each 0  . carvedilol (COREG) 12.5 MG tablet Take 1 tablet (12.5 mg total) by mouth 2 (two) times daily. 180 tablet 4  . finasteride (PROSCAR) 5 MG tablet Take 1 tablet (5 mg total) by  mouth daily. (Patient taking differently: Take 5 mg by mouth at bedtime. ) 90 tablet 1  . furosemide (LASIX) 20 MG tablet Take 1 tablet (20 mg total) by mouth 2 (two) times daily. 180 tablet 3  . gabapentin (NEURONTIN) 300 MG capsule Take 1 capsule (300 mg total) by mouth at bedtime. 30 capsule 11  . Insulin Pen Needle (BD PEN NEEDLE NANO U/F) 32G X 4 MM MISC USE AS DIRECTED 100 each 11  . omeprazole (PRILOSEC) 20 MG capsule TAKE 1 CAPSULE BY MOUTH TWICE DAILY 60 capsule 2  . potassium chloride (K-DUR) 10 MEQ tablet Take 1 tablet (10 mEq total) by mouth 2 (two) times daily. 180 tablet  3  . ramipril (ALTACE) 10 MG capsule Take 1 capsule (10 mg total) by mouth 2 (two) times daily. 180 capsule 4  . rosuvastatin (CRESTOR) 20 MG tablet Take 1 tablet (20 mg total) by mouth daily. 90 tablet 4  . TOUJEO SOLOSTAR 300 UNIT/ML SOPN INJECT 50-60 UNITS INTO THE SKIN DAILY .INCREASE BY 1 UNITS/DAY UNTILFBS ARE BETWEEN 80-130. DO NOT EXCEED 60 UNITS PER DAY. (Patient taking differently: Inject 46-48 Units into the skin daily. ) 4.5 mL 6  . traMADol (ULTRAM) 50 MG tablet Take 1 tablet (50 mg total) by mouth 2 (two) times daily as needed. (Patient taking differently: Take 50 mg by mouth 2 (two) times daily as needed (for pain.). ) 10 tablet 0  . traZODone (DESYREL) 50 MG tablet TAKE ONE TO TWO TABLETS BY MOUTH AT BEDTIME AS NEEDED FOR SLEEP 90 tablet 3  . vitamin B-12 (CYANOCOBALAMIN) 50 MCG tablet Take 50 mcg by mouth daily.    . Vitamin D, Cholecalciferol, 1000 units TABS Take 1,000 Units by mouth daily.      No current facility-administered medications for this visit.     Allergies:   Cortisone; Metformin; and Metformin and related   Social History:  The patient  reports that he has never smoked. He has never used smokeless tobacco. He reports current alcohol use. He reports that he does not use drugs.   Family History:   family history includes Heart disease in his father; Lung cancer in his mother.    Review of Systems: Review of Systems  Constitutional: Negative.   HENT: Negative.   Respiratory: Positive for shortness of breath.   Cardiovascular: Negative.   Gastrointestinal: Negative.   Musculoskeletal: Negative.   Neurological: Negative.   Psychiatric/Behavioral: Negative.   All other systems reviewed and are negative.    PHYSICAL EXAM: VS:  BP (!) 138/58 (BP Location: Left Arm, Patient Position: Sitting, Cuff Size: Normal)   Pulse 61   Ht _0  (1.854 m)   Wt 218 lb 8 oz (99.1 kg)   BMI 28.83 kg/m   , BMI Body mass index is 28.83 kg/m. Constitutional:  oriented to  person, place, and time. No distress.  HENT:  Head: Grossly normal Eyes:  no discharge. No scleral icterus.  Neck: No JVD, no carotid bruits  Cardiovascular: Regular rate and rhythm, no murmurs appreciated Pulmonary/Chest: Clear to auscultation bilaterally, no wheezes or rails Abdominal: Soft.  no distension.  no tenderness.  Musculoskeletal: Normal range of motion Neurological:  normal muscle tone. Coordination normal. No atrophy Skin: Skin warm and dry Psychiatric: normal affect, pleasant  Recent Labs: 12/27/2017: ALT 12; TSH 1.38 01/08/2018: Magnesium 2.0 01/09/2018: Hemoglobin 13.1; Platelets 89 03/02/2018: BUN 24; Creatinine, Ser 1.47; Potassium 4.6; Sodium 140    Lipid Panel Lab Results  Component Value  Date   CHOL 125 12/27/2017   HDL 31.00 (L) 08/18/2017   LDLCALC 41 08/18/2017   TRIG 289 (H) 12/27/2017      Wt Readings from Last 3 Encounters:  03/12/18 218 lb 8 oz (99.1 kg)  03/05/18 219 lb 9.6 oz (99.6 kg)  01/31/18 217 lb 8 oz (98.7 kg)      ASSESSMENT AND PLAN:  stable angina,  known CAD/CABG Chronic stable shortness of breath  cardiac catheterization, medical management recommended Continue statin daily, blood pressure now under better control Recommended cardiac rehab Not doing much at home, light exercise with bicycle 3 days a week  Chronic diastolic CHF (congestive heart failure) ([place]) - Plan: EKG 12-Lead We will continue Lasix twice daily with potassium but will skip 1 day/week given climbing BUN and creatinine on recent lab work  SOB (shortness of breath) - Plan: EKG 12-Lead Chronic issue,  Need weight loss, conditioning Blood pressure now better controlled, has tolerated higher dose Lasix for chronic diastolic CHF  Hypertension Blood pressure relatively well controlled in the office Recommend he did not check blood pressure before medications but would check later in the day He seems to always check first thing in the morning before any  medication and systolic pressure 473  Bell's palsy Previously treated with prednisone and antivirals Prior history of  shingles   Total encounter time more than 25 minutes  Greater than 50% was spent in counseling and coordination of care with the patient  Disposition:   F/U  12 month   No orders of the defined types were placed in this encounter.    Signed, Esmond Plants, M.D., Ph.D. 03/12/2018  [place], [place]

## 2018-03-12 ENCOUNTER — Encounter: Payer: Self-pay | Admitting: Cardiovascular Disease

## 2018-03-12 ENCOUNTER — Ambulatory Visit: Payer: Medicare Other | Admitting: Cardiovascular Disease

## 2018-03-12 VITALS — BP 138/58 | HR 61 | Ht 73.0 in | Wt 218.5 lb

## 2018-03-12 DIAGNOSIS — E118 Type 2 diabetes mellitus with unspecified complications: Secondary | ICD-10-CM | POA: Diagnosis not present

## 2018-03-12 DIAGNOSIS — I10 Essential (primary) hypertension: Secondary | ICD-10-CM | POA: Diagnosis not present

## 2018-03-12 DIAGNOSIS — I25118 Atherosclerotic heart disease of native coronary artery with other forms of angina pectoris: Secondary | ICD-10-CM

## 2018-03-12 DIAGNOSIS — I5032 Chronic diastolic (congestive) heart failure: Secondary | ICD-10-CM | POA: Diagnosis not present

## 2018-03-12 DIAGNOSIS — I208 Other forms of angina pectoris: Secondary | ICD-10-CM | POA: Insufficient documentation

## 2018-03-12 DIAGNOSIS — E782 Mixed hyperlipidemia: Secondary | ICD-10-CM | POA: Diagnosis not present

## 2018-03-12 MED ORDER — POTASSIUM CHLORIDE ER 10 MEQ PO TBCR: EXTENDED_RELEASE_TABLET | ORAL | Status: DC

## 2018-03-12 MED ORDER — FUROSEMIDE 20 MG PO TABS: ORAL_TABLET | ORAL | Status: DC

## 2018-03-12 NOTE — Patient Instructions (Addendum)
We will place an order for heart track at [facility] The Cardiac Rehab Department will contact you directly to schedule an orientation.   Medication Instructions:   - Hold lasix and potassium one day a week  If you need a refill on your cardiac medications before your next appointment, please call your pharmacy.    Lab work: No new labs needed   If you have labs (blood work) drawn today and your tests are completely normal, you will receive your results only by: Marland Kitchen MyChart Message (if you have MyChart) OR . A paper copy in the mail If you have any lab test that is abnormal or we need to change your treatment, we will call you to review the results.   Testing/Procedures: No new testing needed   Follow-Up: At [facility], you and your health needs are our priority.  As part of our continuing mission to provide you with exceptional heart care, we have created designated Provider Care Teams.  These Care Teams include your primary Cardiologist (physician) and Advanced Practice Providers (APPs -  Physician Assistants and Nurse Practitioners) who all work together to provide you with the care you need, when you need it.  . You will need a follow up appointment in 12 months .   Please call our office 2 months in advance to schedule this appointment.    . Providers on your designated Care Team:   . [name], NP . Christell Faith, PA-C . Marrianne Mood, PA-C  Any Other Special Instructions Will Be Listed Below (If Applicable).  For educational health videos Log in to : [email or web address] Or : [email or web address], password : [email or web address]

## 2018-03-13 ENCOUNTER — Encounter: Payer: Self-pay | Admitting: *Deleted

## 2018-03-14 ENCOUNTER — Ambulatory Visit: Admission: RE | Admit: 2018-03-14 | Payer: Medicare Other | Source: Home / Self Care | Admitting: Gastroenterology

## 2018-03-14 ENCOUNTER — Encounter: Payer: Self-pay | Admitting: Anesthesiology

## 2018-03-14 ENCOUNTER — Ambulatory Visit: Payer: Medicare Other

## 2018-03-14 ENCOUNTER — Encounter: Admission: RE | Payer: Self-pay | Source: Home / Self Care

## 2018-03-14 DIAGNOSIS — Z1211 Encounter for screening for malignant neoplasm of colon: Secondary | ICD-10-CM

## 2018-03-14 SURGERY — COLONOSCOPY WITH PROPOFOL
Anesthesia: General

## 2018-03-15 ENCOUNTER — Ambulatory Visit
Admission: RE | Admit: 2018-03-15 | Discharge: 2018-03-15 | Disposition: A | Discharge status: Home / Self Care | Payer: Medicare Other | Source: Ambulatory Visit | Attending: Family Medicine | Admitting: Family Medicine

## 2018-03-15 DIAGNOSIS — N6459 Other signs and symptoms in breast: Secondary | ICD-10-CM

## 2018-03-15 DIAGNOSIS — R928 Other abnormal and inconclusive findings on diagnostic imaging of breast: Secondary | ICD-10-CM | POA: Diagnosis not present

## 2018-03-19 ENCOUNTER — Encounter: Payer: Self-pay | Admitting: *Deleted

## 2018-03-19 ENCOUNTER — Encounter: Payer: Medicare Other | Attending: Cardiovascular Disease | Admitting: *Deleted

## 2018-03-19 VITALS — Ht 71.9 in | Wt 220.0 lb

## 2018-03-19 DIAGNOSIS — I208 Other forms of angina pectoris: Secondary | ICD-10-CM

## 2018-03-19 DIAGNOSIS — I110 Hypertensive heart disease with heart failure: Secondary | ICD-10-CM | POA: Insufficient documentation

## 2018-03-19 DIAGNOSIS — I5032 Chronic diastolic (congestive) heart failure: Secondary | ICD-10-CM | POA: Insufficient documentation

## 2018-03-19 NOTE — Patient Instructions (Signed)
Patient Instructions  Patient Details  Name: [name] [name] MRN: [id number] Date of Birth: Jun 14, 1933 Referring Provider:  Minna Merritts, MD  Below are your personal goals for exercise, nutrition, and risk factors. Our goal is to help you stay on track towards obtaining and maintaining these goals. We will be discussing your progress on these goals with you throughout the program.  Initial Exercise Prescription: Initial Exercise Prescription - 03/19/18 1200      Date of Initial Exercise RX and Referring Provider   Date  03/19/18    Referring Provider  Ida Rogue MD      Treadmill   MPH  1.5    Grade  0.5    Minutes  15    METs  2.25      NuStep   Level  2    SPM  80    Minutes  15    METs  2      REL-XR   Level  1    Speed  50    Minutes  15    METs  2      Prescription Details   Frequency (times per week)  3    Duration  Progress to 45 minutes of aerobic exercise without signs/symptoms of physical distress      Intensity   THRR 40-80% of Max Heartrate  90-122    Ratings of Perceived Exertion  11-13    Perceived Dyspnea  0-4      Progression   Progression  Continue to progress workloads to maintain intensity without signs/symptoms of physical distress.      Resistance Training   Training Prescription  Yes    Weight  3 lbs    Reps  10-15       Exercise Goals: Frequency: Be able to perform aerobic exercise two to three times per week in program working toward 2-5 days per week of home exercise.  Intensity: Work with a perceived exertion of 11 (fairly light) - 15 (hard) while following your exercise prescription.  We will make changes to your prescription with you as you progress through the program.   Duration: Be able to do 30 to 45 minutes of continuous aerobic exercise in addition to a 5 minute warm-up and a 5 minute cool-down routine.   Nutrition Goals: Your personal nutrition goals will be established when you do your nutrition analysis with  the dietician.  The following are general nutrition guidelines to follow: Cholesterol < 200mg /day Sodium < 1500mg /day Fiber: Men over 50 yrs - 30 grams per day  Personal Goals: Personal Goals and Risk Factors at Admission - 03/19/18 1322      Core Components/Risk Factors/Patient Goals on Admission    Weight Management  Yes;Obesity    Intervention  Weight Management: Develop a combined nutrition and exercise program designed to reach desired caloric intake, while maintaining appropriate intake of nutrient and fiber, sodium and fats, and appropriate energy expenditure required for the weight goal.;Weight Management/Obesity: Establish reasonable short term and long term weight goals.    Admit Weight  220 lb (99.8 kg)    Goal Weight: Short Term  218 lb (98.9 kg)    Goal Weight: Long Term  200 lb (90.7 kg)    Expected Outcomes  Short Term: Continue to assess and modify interventions until short term weight is achieved;Long Term: Adherence to nutrition and physical activity/exercise program aimed toward attainment of established weight goal;Weight Loss: Understanding of general recommendations for a balanced deficit meal  plan, which promotes 1-2 lb weight loss per week and includes a negative energy balance of [id number] kcal/d    Diabetes  Yes    Intervention  Provide education about signs/symptoms and action to take for hypo/hyperglycemia.;Provide education about proper nutrition, including hydration, and aerobic/resistive exercise prescription along with prescribed medications to achieve blood glucose in normal ranges: Fasting glucose 65-99 mg/dL    Expected Outcomes  Short Term: Participant verbalizes understanding of the signs/symptoms and immediate care of hyper/hypoglycemia, proper foot care and importance of medication, aerobic/resistive exercise and nutrition plan for blood glucose control.;Long Term: Attainment of HbA1C < 7%.    Heart Failure  Yes    Intervention  Provide a combined exercise  and nutrition program that is supplemented with education, support and counseling about heart failure. Directed toward relieving symptoms such as shortness of breath, decreased exercise tolerance, and extremity edema.    Expected Outcomes  Improve functional capacity of life;Short term: Attendance in program 2-3 days a week with increased exercise capacity. Reported lower sodium intake. Reported increased fruit and vegetable intake. Reports medication compliance.;Short term: Daily weights obtained and reported for increase. Utilizing diuretic protocols set by physician.;Long term: Adoption of self-care skills and reduction of barriers for early signs and symptoms recognition and intervention leading to self-care maintenance.    Hypertension  Yes    Intervention  Provide education on lifestyle modifcations including regular physical activity/exercise, weight management, moderate sodium restriction and increased consumption of fresh fruit, vegetables, and low fat dairy, alcohol moderation, and smoking cessation.;Monitor prescription use compliance.    Expected Outcomes  Short Term: Continued assessment and intervention until BP is < 140/92mm HG in hypertensive participants. < 130/63mm HG in hypertensive participants with diabetes, heart failure or chronic kidney disease.;Long Term: Maintenance of blood pressure at goal levels.    Lipids  Yes    Intervention  Provide education and support for participant on nutrition & aerobic/resistive exercise along with prescribed medications to achieve LDL 70mg , HDL >40mg .    Expected Outcomes  Short Term: Participant states understanding of desired cholesterol values and is compliant with medications prescribed. Participant is following exercise prescription and nutrition guidelines.;Long Term: Cholesterol controlled with medications as prescribed, with individualized exercise RX and with personalized nutrition plan. Value goals: LDL < 70mg , HDL > 40 mg.       Tobacco  Use Initial Evaluation: Social History   Tobacco Use  Smoking Status Never Smoker  Smokeless Tobacco Never Used    Exercise Goals and Review: Exercise Goals    Row Name 03/19/18 1248             Exercise Goals   Increase Physical Activity  Yes       Intervention  Provide advice, education, support and counseling about physical activity/exercise needs.;Develop an individualized exercise prescription for aerobic and resistive training based on initial evaluation findings, risk stratification, comorbidities and participant's personal goals.       Expected Outcomes  Short Term: Attend rehab on a regular basis to increase amount of physical activity.;Long Term: Add in home exercise to make exercise part of routine and to increase amount of physical activity.;Long Term: Exercising regularly at least 3-5 days a week.       Increase Strength and Stamina  Yes       Intervention  Provide advice, education, support and counseling about physical activity/exercise needs.;Develop an individualized exercise prescription for aerobic and resistive training based on initial evaluation findings, risk stratification, comorbidities and participant's personal goals.  Expected Outcomes  Short Term: Increase workloads from initial exercise prescription for resistance, speed, and METs.;Short Term: Perform resistance training exercises routinely during rehab and add in resistance training at home;Long Term: Improve cardiorespiratory fitness, muscular endurance and strength as measured by increased METs and functional capacity (6MWT)       Able to understand and use rate of perceived exertion (RPE) scale  Yes       Intervention  Provide education and explanation on how to use RPE scale       Expected Outcomes  Short Term: Able to use RPE daily in rehab to express subjective intensity level;Long Term:  Able to use RPE to guide intensity level when exercising independently       Able to understand and use Dyspnea  scale  Yes       Intervention  Provide education and explanation on how to use Dyspnea scale       Expected Outcomes  Short Term: Able to use Dyspnea scale daily in rehab to express subjective sense of shortness of breath during exertion;Long Term: Able to use Dyspnea scale to guide intensity level when exercising independently       Knowledge and understanding of Target Heart Rate Range (THRR)  Yes       Intervention  Provide education and explanation of THRR including how the numbers were predicted and where they are located for reference       Expected Outcomes  Short Term: Able to state/look up THRR;Short Term: Able to use daily as guideline for intensity in rehab;Long Term: Able to use THRR to govern intensity when exercising independently       Able to check pulse independently  Yes       Intervention  Provide education and demonstration on how to check pulse in carotid and radial arteries.;Review the importance of being able to check your own pulse for safety during independent exercise       Expected Outcomes  Short Term: Able to explain why pulse checking is important during independent exercise;Long Term: Able to check pulse independently and accurately       Understanding of Exercise Prescription  Yes       Intervention  Provide education, explanation, and written materials on patient's individual exercise prescription       Expected Outcomes  Short Term: Able to explain program exercise prescription;Long Term: Able to explain home exercise prescription to exercise independently          Copy of goals given to participant.

## 2018-03-19 NOTE — Progress Notes (Signed)
Cardiac Individual Treatment Plan  Patient Details  Name: JOSTEN WARMUTH MRN: [id number] Date of Birth: [birth year] Referring Provider:     Cardiac Rehab from 03/19/2018 in [facility] Cardiac and Pulmonary Rehab  Referring Provider  Ida Rogue MD      Initial Encounter Date:    Cardiac Rehab from 03/19/2018 in [facility] Cardiac and Pulmonary Rehab  Date  03/19/18      Visit Diagnosis: Stable angina ([place])  Patient's Home Medications on Admission:  Current Outpatient Medications:  .  acetaminophen (TYLENOL) 500 MG tablet, Take 1,000 mg by mouth 2 (two) times daily., Disp: , Rfl:  .  amLODipine (NORVASC) 5 MG tablet, Take 1 tablet (5 mg total) by mouth 2 (two) times daily., Disp: 90 tablet, Rfl: 3 .  aspirin EC 81 MG tablet, Take 81 mg by mouth daily., Disp: , Rfl:  .  BD INSULIN SYRINGE ULTRAFINE 31G X 15/64" 0.3 ML MISC, USE AS DIRECTED, Disp: 100 each, Rfl: 3 .  blood glucose meter kit and supplies KIT, OneTouch Verio w/ Device Kit OneTouch Verio VI STRP OneTouch Delica Lancet Dev misc OneTouch Delica Lancets 61W or Fine  Please check CBGs fasting and 2 other times during the day  Dx Code E11.8, Disp: 1 each, Rfl: 0 .  carvedilol (COREG) 12.5 MG tablet, Take 1 tablet (12.5 mg total) by mouth 2 (two) times daily., Disp: 180 tablet, Rfl: 4 .  finasteride (PROSCAR) 5 MG tablet, Take 1 tablet (5 mg total) by mouth daily. (Patient taking differently: Take 5 mg by mouth at bedtime. ), Disp: 90 tablet, Rfl: 1 .  furosemide (LASIX) 20 MG tablet, Take 1 tablet (20 mg) by mouth twice daily 6 days a week, Disp: , Rfl:  .  Insulin Pen Needle (BD PEN NEEDLE NANO U/F) 32G X 4 MM MISC, USE AS DIRECTED, Disp: 100 each, Rfl: 11 .  omeprazole (PRILOSEC) 20 MG capsule, TAKE 1 CAPSULE BY MOUTH TWICE DAILY, Disp: 60 capsule, Rfl: 2 .  potassium chloride (K-DUR) 10 MEQ tablet, Take 1 tablet (10 meq) by mouth twice daily 6 days a week, Disp: , Rfl:  .  ramipril (ALTACE) 10 MG capsule, Take 1 capsule (10 mg total)  by mouth 2 (two) times daily., Disp: 180 capsule, Rfl: 4 .  rosuvastatin (CRESTOR) 20 MG tablet, Take 1 tablet (20 mg total) by mouth daily., Disp: 90 tablet, Rfl: 4 .  TOUJEO SOLOSTAR 300 UNIT/ML SOPN, INJECT 50-60 UNITS INTO THE SKIN DAILY .INCREASE BY 1 UNITS/DAY UNTILFBS ARE BETWEEN 80-130. DO NOT EXCEED 60 UNITS PER DAY. (Patient taking differently: Inject 46-48 Units into the skin daily. ), Disp: 4.5 mL, Rfl: 6 .  traMADol (ULTRAM) 50 MG tablet, Take 1 tablet (50 mg total) by mouth 2 (two) times daily as needed. (Patient taking differently: Take 50 mg by mouth 2 (two) times daily as needed (for pain.). ), Disp: 10 tablet, Rfl: 0 .  traZODone (DESYREL) 50 MG tablet, TAKE ONE TO TWO TABLETS BY MOUTH AT BEDTIME AS NEEDED FOR SLEEP, Disp: 90 tablet, Rfl: 3 .  vitamin B-12 (CYANOCOBALAMIN) 50 MCG tablet, Take 50 mcg by mouth daily., Disp: , Rfl:  .  Vitamin D, Cholecalciferol, 1000 units TABS, Take 1,000 Units by mouth daily. , Disp: , Rfl:  .  gabapentin (NEURONTIN) 300 MG capsule, Take 1 capsule (300 mg total) by mouth at bedtime. (Patient not taking: Reported on 03/19/2018), Disp: 30 capsule, Rfl: 11  Past Medical History: Past Medical History:  Diagnosis Date  .  BPH (benign prostatic hyperplasia)   . Cancer ([place])    skin  . Cataract   . Coronary artery disease    a. 4v CABG 01/2005; b. cath 06/05/13 showed patent grafts: LIMA-LAD, VG-D, VG-OM3, VG-dRCA, nl filling pressures, nl LV fxn  . Diastolic dysfunction    a. echo 08/9792: nl systolic fxn, mild LVH, diastolic relaxation abnormality, mildly enlarged LA, mild Ao insufficiency  . Dyspnea    with exertion  . ED (erectile dysfunction)   . GERD (gastroesophageal reflux disease)   . Hyperlipidemia   . Hypertension   . IDDM (insulin dependent diabetes mellitus) ([place]) 2000  . Osteoarthritis   . Perirectal fistula   . Pneumonia 12/2016  . Tubular adenoma of colon 07/2012  . Vertigo     Tobacco Use: Social History   Tobacco Use   Smoking Status Never Smoker  Smokeless Tobacco Never Used    Labs: Recent Review Flowsheet Data    Labs for ITP Cardiac and Pulmonary Rehab Latest Ref Rng & Units 06/02/2016 08/02/2016 11/09/2016 08/18/2017 12/27/2017   Cholestrol 100 - 199 mg/dL 121 - - 99 125   LDLCALC 0 - 99 mg/dL - - - 41 -   LDLDIRECT mg/dL 65.0 - - - -   HDL >39.00 mg/dL 32.50(L) - - 31.00(L) -   Trlycerides 0 - 149 mg/dL 260.0(H) - - 137.0 289(H)   Hemoglobin A1c 4.6 - 6.5 % - 8.4 8.3 7.7(H) 6.9(H)   PHART 7.350 - 7.450 - - - - -   PCO2ART 35.0 - 45.0 mmHg - - - - -   HCO3 20.0 - 24.0 mEq/L - - - - -   TCO2 0 - 100 mmol/L - - - - -   O2SAT % - - - - -       Exercise Target Goals: Exercise Program Goal: Individual exercise prescription set using results from initial 6 min walk test and THRR while considering  patient's activity barriers and safety.   Exercise Prescription Goal: Initial exercise prescription builds to 30-45 minutes a day of aerobic activity, 2-3 days per week.  Home exercise guidelines will be given to patient during program as part of exercise prescription that the participant will acknowledge.  Activity Barriers & Risk Stratification: Activity Barriers & Cardiac Risk Stratification - 03/19/18 1246      Activity Barriers & Cardiac Risk Stratification   Activity Barriers  Back Problems;Deconditioning;Muscular Weakness;Shortness of Breath;Balance Concerns   Occasional vertigo   Cardiac Risk Stratification  High       6 Minute Walk: 6 Minute Walk    Row Name 03/19/18 1245         6 Minute Walk   Phase  Initial     Distance  965 feet     Walk Time  6 minutes     # of Rest Breaks  0     MPH  1.83     METS  1.5     RPE  15     Perceived Dyspnea   1     VO2 Peak  5.25     Symptoms  Yes (comment)     Comments  hip pain 7-8/10, SOB     Resting HR  58 bpm     Resting BP  124/56     Resting Oxygen Saturation   97 %     Exercise Oxygen Saturation  during 6 min walk  97 %     Max  Ex. HR  84  bpm     Max Ex. BP  144/74     2 Minute Post BP  120/64        Oxygen Initial Assessment:   Oxygen Re-Evaluation:   Oxygen Discharge (Final Oxygen Re-Evaluation):   Initial Exercise Prescription: Initial Exercise Prescription - 03/19/18 1200      Date of Initial Exercise RX and Referring Provider   Date  03/19/18    Referring Provider  Ida Rogue MD      Treadmill   MPH  1.5    Grade  0.5    Minutes  15    METs  2.25      NuStep   Level  2    SPM  80    Minutes  15    METs  2      REL-XR   Level  1    Speed  50    Minutes  15    METs  2      Prescription Details   Frequency (times per week)  3    Duration  Progress to 45 minutes of aerobic exercise without signs/symptoms of physical distress      Intensity   THRR 40-80% of Max Heartrate  90-122    Ratings of Perceived Exertion  11-13    Perceived Dyspnea  0-4      Progression   Progression  Continue to progress workloads to maintain intensity without signs/symptoms of physical distress.      Resistance Training   Training Prescription  Yes    Weight  3 lbs    Reps  10-15       Perform Capillary Blood Glucose checks as needed.  Exercise Prescription Changes: Exercise Prescription Changes    Row Name 03/19/18 1200             Response to Exercise   Blood Pressure (Admit)  124/56       Blood Pressure (Exercise)  144/74       Blood Pressure (Exit)  120/64       Heart Rate (Admit)  58 bpm       Heart Rate (Exercise)  84 bpm       Heart Rate (Exit)  62 bpm       Oxygen Saturation (Admit)  97 %       Oxygen Saturation (Exercise)  97 %       Rating of Perceived Exertion (Exercise)  15       Perceived Dyspnea (Exercise)  1       Symptoms  hip pain 7-8/10; SOB       Comments  walk test results          Exercise Comments:   Exercise Goals and Review: Exercise Goals    Row Name 03/19/18 1248             Exercise Goals   Increase Physical Activity  Yes        Intervention  Provide advice, education, support and counseling about physical activity/exercise needs.;Develop an individualized exercise prescription for aerobic and resistive training based on initial evaluation findings, risk stratification, comorbidities and participant's personal goals.       Expected Outcomes  Short Term: Attend rehab on a regular basis to increase amount of physical activity.;Long Term: Add in home exercise to make exercise part of routine and to increase amount of physical activity.;Long Term: Exercising regularly at least 3-5 days a week.       Increase Strength  and Stamina  Yes       Intervention  Provide advice, education, support and counseling about physical activity/exercise needs.;Develop an individualized exercise prescription for aerobic and resistive training based on initial evaluation findings, risk stratification, comorbidities and participant's personal goals.       Expected Outcomes  Short Term: Increase workloads from initial exercise prescription for resistance, speed, and METs.;Short Term: Perform resistance training exercises routinely during rehab and add in resistance training at home;Long Term: Improve cardiorespiratory fitness, muscular endurance and strength as measured by increased METs and functional capacity (6MWT)       Able to understand and use rate of perceived exertion (RPE) scale  Yes       Intervention  Provide education and explanation on how to use RPE scale       Expected Outcomes  Short Term: Able to use RPE daily in rehab to express subjective intensity level;Long Term:  Able to use RPE to guide intensity level when exercising independently       Able to understand and use Dyspnea scale  Yes       Intervention  Provide education and explanation on how to use Dyspnea scale       Expected Outcomes  Short Term: Able to use Dyspnea scale daily in rehab to express subjective sense of shortness of breath during exertion;Long Term: Able to use  Dyspnea scale to guide intensity level when exercising independently       Knowledge and understanding of Target Heart Rate Range (THRR)  Yes       Intervention  Provide education and explanation of THRR including how the numbers were predicted and where they are located for reference       Expected Outcomes  Short Term: Able to state/look up THRR;Short Term: Able to use daily as guideline for intensity in rehab;Long Term: Able to use THRR to govern intensity when exercising independently       Able to check pulse independently  Yes       Intervention  Provide education and demonstration on how to check pulse in carotid and radial arteries.;Review the importance of being able to check your own pulse for safety during independent exercise       Expected Outcomes  Short Term: Able to explain why pulse checking is important during independent exercise;Long Term: Able to check pulse independently and accurately       Understanding of Exercise Prescription  Yes       Intervention  Provide education, explanation, and written materials on patient's individual exercise prescription       Expected Outcomes  Short Term: Able to explain program exercise prescription;Long Term: Able to explain home exercise prescription to exercise independently          Exercise Goals Re-Evaluation :   Discharge Exercise Prescription (Final Exercise Prescription Changes): Exercise Prescription Changes - 03/19/18 1200      Response to Exercise   Blood Pressure (Admit)  124/56    Blood Pressure (Exercise)  144/74    Blood Pressure (Exit)  120/64    Heart Rate (Admit)  58 bpm    Heart Rate (Exercise)  84 bpm    Heart Rate (Exit)  62 bpm    Oxygen Saturation (Admit)  97 %    Oxygen Saturation (Exercise)  97 %    Rating of Perceived Exertion (Exercise)  15    Perceived Dyspnea (Exercise)  1    Symptoms  hip pain 7-8/10; SOB  Comments  walk test results       Nutrition:  Target Goals: Understanding of nutrition  guidelines, daily intake of sodium <1558m, cholesterol <2048m calories 30% from fat and 7% or less from saturated fats, daily to have 5 or more servings of fruits and vegetables.  Biometrics: Pre Biometrics - 03/19/18 1249      Pre Biometrics   Height  5' 11.9" (1.826 m)    Weight  220 lb (99.8 kg)    Waist Circumference  42.5 inches    Hip Circumference  43.5 inches    Waist to Hip Ratio  0.98 %    BMI (Calculated)  29.93    Single Leg Stand  6.17 seconds        Nutrition Therapy Plan and Nutrition Goals: Nutrition Therapy & Goals - 03/19/18 1319      Intervention Plan   Intervention  Prescribe, educate and counsel regarding individualized specific dietary modifications aiming towards targeted core components such as weight, hypertension, lipid management, diabetes, heart failure and other comorbidities.    Expected Outcomes  Short Term Goal: Understand basic principles of dietary content, such as calories, fat, sodium, cholesterol and nutrients.;Short Term Goal: A plan has been developed with personal nutrition goals set during dietitian appointment.;Long Term Goal: Adherence to prescribed nutrition plan.       Nutrition Assessments: Nutrition Assessments - 03/19/18 1333      Rate Your Plate Scores   Pre Score  31       Nutrition Goals Re-Evaluation:   Nutrition Goals Discharge (Final Nutrition Goals Re-Evaluation):   Psychosocial: Target Goals: Acknowledge presence or absence of significant depression and/or stress, maximize coping skills, provide positive support system. Participant is able to verbalize types and ability to use techniques and skills needed for reducing stress and depression.   Initial Review & Psychosocial Screening: Initial Psych Review & Screening - 03/19/18 1318      Initial Review   Current issues with  Current Sleep Concerns   Not able to fall asleep.  Best sleep is between 6 to 9 AM     [place] Yes    Spouse     Barriers   Psychosocial barriers to participate in program  There are no identifiable barriers or psychosocial needs.;The patient should benefit from training in stress management and relaxation.      Screening Interventions   Interventions  Encouraged to exercise;To provide support and resources with identified psychosocial needs;Provide feedback about the scores to participant    Expected Outcomes  Short Term goal: Utilizing psychosocial counselor, staff and physician to assist with identification of specific Stressors or current issues interfering with healing process. Setting desired goal for each stressor or current issue identified.;Long Term Goal: Stressors or current issues are controlled or eliminated.;Short Term goal: Identification and review with participant of any Quality of Life or Depression concerns found by scoring the questionnaire.;Long Term goal: The participant improves quality of Life and PHQ9 Scores as seen by post scores and/or verbalization of changes       Quality of Life Scores:  Quality of Life - 03/19/18 1318      Quality of Life   Select  Quality of Life      Quality of Life Scores   Health/Function Pre  19.6 %    Socioeconomic Pre  30 %    Psych/Spiritual Pre  30 %    Family Pre  27.6 %    GLOBAL Pre  25.2 %      Scores of 19 and below usually indicate a poorer quality of life in these areas.  A difference of  2-3 points is a clinically meaningful difference.  A difference of 2-3 points in the total score of the Quality of Life Index has been associated with significant improvement in overall quality of life, self-image, physical symptoms, and general health in studies assessing change in quality of life.  PHQ-9: Recent Review Flowsheet Data    Depression screen [facility] 2/9 03/19/2018 03/05/2018 12/26/2017 05/06/2016 10/05/2015   Decreased Interest 0 0 0 0 0   Down, Depressed, Hopeless 0 0 0 0 0   PHQ - 2 Score 0 0 0 0 0   Altered sleeping 1 - - 2  1   Tired, decreased energy 1 - - 2 2   Change in appetite 1 - - 1 2   Feeling bad or failure about yourself  0 - - 0 0   Trouble concentrating 0 - - 0 0   Moving slowly or fidgety/restless 0 - - 0 0   Suicidal thoughts 0 - - 0 0   PHQ-9 Score 3 - - 5 5   Difficult doing work/chores - - - Not difficult at all Not difficult at all     Interpretation of Total Score  Total Score Depression Severity:  1-4 = Minimal depression, 5-9 = Mild depression, 10-14 = Moderate depression, 15-19 = Moderately severe depression, 20-27 = Severe depression   Psychosocial Evaluation and Intervention:   Psychosocial Re-Evaluation:   Psychosocial Discharge (Final Psychosocial Re-Evaluation):   Vocational Rehabilitation: Provide vocational rehab assistance to qualifying candidates.   Vocational Rehab Evaluation & Intervention: Vocational Rehab - 03/19/18 1320      Initial Vocational Rehab Evaluation & Intervention   Assessment shows need for Vocational Rehabilitation  No       Education: Education Goals: Education classes will be provided on a variety of topics geared toward better understanding of heart health and risk factor modification. Participant will state understanding/return demonstration of topics presented as noted by education test scores.  Learning Barriers/Preferences: Learning Barriers/Preferences - 03/19/18 1320      Learning Barriers/Preferences   Learning Barriers  None    Learning Preferences  None       Education Topics:  AED/CPR: - Group verbal and written instruction with the use of models to demonstrate the basic use of the AED with the basic ABC's of resuscitation.   General Nutrition Guidelines/Fats and Fiber: -Group instruction provided by verbal, written material, models and posters to present the general guidelines for heart healthy nutrition. Gives an explanation and review of dietary fats and fiber.   Controlling Sodium/Reading Food Labels: -Group  verbal and written material supporting the discussion of sodium use in heart healthy nutrition. Review and explanation with models, verbal and written materials for utilization of the food label.   Exercise Physiology & General Exercise Guidelines: - Group verbal and written instruction with models to review the exercise physiology of the cardiovascular system and associated critical values. Provides general exercise guidelines with specific guidelines to those with heart or lung disease.    Cardiac Rehab from 04/18/2016 in [facility] Cardiac and Pulmonary Rehab  Date  01/13/16  Educator  AS  Instruction Review Code (retired)  2- meets goals/outcomes      Aerobic Exercise & Resistance Training: - Gives group verbal and written instruction on the various components of exercise. Focuses on aerobic and resistive training programs  and the benefits of this training and how to safely progress through these programs..   Cardiac Rehab from 04/18/2016 in [facility] Cardiac and Pulmonary Rehab  Date  02/01/16  Educator  K. Amedeo Plenty, RN  Instruction Review Code (retired)  2- Statistician, Insurance underwriter, Mind/Body Relaxation: Provides group verbal/written instruction on the benefits of flexibility and balance training, including mind/body exercise modes such as yoga, pilates and tai chi.  Demonstration and skill practice provided.   Cardiac Rehab from 04/18/2016 in [facility] Cardiac and Pulmonary Rehab  Date  12/14/15  Educator  [facility]  Instruction Review Code (retired)  2- meets goals/outcomes      Stress and Anxiety: - Provides group verbal and written instruction about the health risks of elevated stress and causes of high stress.  Discuss the correlation between heart/lung disease and anxiety and treatment options. Review healthy ways to manage with stress and anxiety.   Cardiac Rehab from 04/18/2016 in [facility] Cardiac and Pulmonary Rehab  Date  04/13/16  Educator  [facility]  Instruction Review Code  (retired)  2- meets goals/outcomes      Depression: - Provides group verbal and written instruction on the correlation between heart/lung disease and depressed mood, treatment options, and the stigmas associated with seeking treatment.   Anatomy & Physiology of the Heart: - Group verbal and written instruction and models provide basic cardiac anatomy and physiology, with the coronary electrical and arterial systems. Review of Valvular disease and Heart Failure   Cardiac Rehab from 04/18/2016 in [facility] Cardiac and Pulmonary Rehab  Date  02/08/16  Educator  SB  Instruction Review Code (retired)  2- meets goals/outcomes      Cardiac Procedures: - Group verbal and written instruction to review commonly prescribed medications for heart disease. Reviews the medication, class of the drug, and side effects. Includes the steps to properly store meds and maintain the prescription regimen. (beta blockers and nitrates)   Cardiac Rehab from 04/18/2016 in [facility] Cardiac and Pulmonary Rehab  Date  04/18/16  Educator  CE  Instruction Review Code (retired)  2- meets goals/outcomes      Cardiac Medications I: - Group verbal and written instruction to review commonly prescribed medications for heart disease. Reviews the medication, class of the drug, and side effects. Includes the steps to properly store meds and maintain the prescription regimen.   Cardiac Medications II: -Group verbal and written instruction to review commonly prescribed medications for heart disease. Reviews the medication, class of the drug, and side effects. (all other drug classes)    Go Sex-Intimacy & Heart Disease, Get SMART - Goal Setting: - Group verbal and written instruction through game format to discuss heart disease and the return to sexual intimacy. Provides group verbal and written material to discuss and apply goal setting through the application of the S.M.A.R.T. Method.   Cardiac Rehab from 04/18/2016 in [facility] Cardiac  and Pulmonary Rehab  Date  04/18/16  Educator  CE  Instruction Review Code (retired)  2- meets goals/outcomes      Other Matters of the Heart: - Provides group verbal, written materials and models to describe Stable Angina and Peripheral Artery. Includes description of the disease process and treatment options available to the cardiac patient.   Cardiac Rehab from 04/18/2016 in [facility] Cardiac and Pulmonary Rehab  Date  04/11/16  Educator  C. Enterkin, RN  Instruction Review Code (retired)  2- meets goals/outcomes      Exercise & Equipment Safety: - Individual  verbal instruction and demonstration of equipment use and safety with use of the equipment.   Cardiac Rehab from 03/19/2018 in [facility] Cardiac and Pulmonary Rehab  Date  03/19/18  Educator  SB  Instruction Review Code  1- Verbalizes Understanding      Infection Prevention: - Provides verbal and written material to individual with discussion of infection control including proper hand washing and proper equipment cleaning during exercise session.   Cardiac Rehab from 03/19/2018 in [facility] Cardiac and Pulmonary Rehab  Date  03/19/18  Educator  SB  Instruction Review Code  1- Verbalizes Understanding      Falls Prevention: - Provides verbal and written material to individual with discussion of falls prevention and safety.   Cardiac Rehab from 03/19/2018 in [facility] Cardiac and Pulmonary Rehab  Date  03/19/18  Educator  SB  Instruction Review Code  1- Verbalizes Understanding      Diabetes: - Individual verbal and written instruction to review signs/symptoms of diabetes, desired ranges of glucose level fasting, after meals and with exercise. Acknowledge that pre and post exercise glucose checks will be done for 3 sessions at entry of program.   Cardiac Rehab from 03/19/2018 in [facility] Cardiac and Pulmonary Rehab  Date  03/19/18  Educator  SB  Instruction Review Code  1- Verbalizes Understanding      Know Your Numbers and Risk  Factors: -Group verbal and written instruction about important numbers in your health.  Discussion of what are risk factors and how they play a role in the disease process.  Review of Cholesterol, Blood Pressure, Diabetes, and BMI and the role they play in your overall health.   Sleep Hygiene: -Provides group verbal and written instruction about how sleep can affect your health.  Define sleep hygiene, discuss sleep cycles and impact of sleep habits. Review good sleep hygiene tips.    Other: -Provides group and verbal instruction on various topics (see comments)   Knowledge Questionnaire Score: Knowledge Questionnaire Score - 03/19/18 1321      Knowledge Questionnaire Score   Pre Score  22/26   Reviewed correct response with [name] today. He verbalized understanding and had no further questions today      Core Components/Risk Factors/Patient Goals at Admission: Personal Goals and Risk Factors at Admission - 03/19/18 1322      Core Components/Risk Factors/Patient Goals on Admission    Weight Management  Yes;Obesity    Intervention  Weight Management: Develop a combined nutrition and exercise program designed to reach desired caloric intake, while maintaining appropriate intake of nutrient and fiber, sodium and fats, and appropriate energy expenditure required for the weight goal.;Weight Management/Obesity: Establish reasonable short term and long term weight goals.    Admit Weight  220 lb (99.8 kg)    Goal Weight: Short Term  218 lb (98.9 kg)    Goal Weight: Long Term  200 lb (90.7 kg)    Expected Outcomes  Short Term: Continue to assess and modify interventions until short term weight is achieved;Long Term: Adherence to nutrition and physical activity/exercise program aimed toward attainment of established weight goal;Weight Loss: Understanding of general recommendations for a balanced deficit meal plan, which promotes 1-2 lb weight loss per week and includes a negative energy balance of  [phone] kcal/d    Diabetes  Yes    Intervention  Provide education about signs/symptoms and action to take for hypo/hyperglycemia.;Provide education about proper nutrition, including hydration, and aerobic/resistive exercise prescription along with prescribed medications to achieve blood glucose in  normal ranges: Fasting glucose 65-99 mg/dL    Expected Outcomes  Short Term: Participant verbalizes understanding of the signs/symptoms and immediate care of hyper/hypoglycemia, proper foot care and importance of medication, aerobic/resistive exercise and nutrition plan for blood glucose control.;Long Term: Attainment of HbA1C < 7%.    Heart Failure  Yes    Intervention  Provide a combined exercise and nutrition program that is supplemented with education, support and counseling about heart failure. Directed toward relieving symptoms such as shortness of breath, decreased exercise tolerance, and extremity edema.    Expected Outcomes  Improve functional capacity of life;Short term: Attendance in program 2-3 days a week with increased exercise capacity. Reported lower sodium intake. Reported increased fruit and vegetable intake. Reports medication compliance.;Short term: Daily weights obtained and reported for increase. Utilizing diuretic protocols set by physician.;Long term: Adoption of self-care skills and reduction of barriers for early signs and symptoms recognition and intervention leading to self-care maintenance.    Hypertension  Yes    Intervention  Provide education on lifestyle modifcations including regular physical activity/exercise, weight management, moderate sodium restriction and increased consumption of fresh fruit, vegetables, and low fat dairy, alcohol moderation, and smoking cessation.;Monitor prescription use compliance.    Expected Outcomes  Short Term: Continued assessment and intervention until BP is < 140/19m HG in hypertensive participants. < 130/879mHG in hypertensive participants  with diabetes, heart failure or chronic kidney disease.;Long Term: Maintenance of blood pressure at goal levels.    Lipids  Yes    Intervention  Provide education and support for participant on nutrition & aerobic/resistive exercise along with prescribed medications to achieve LDL <705mHDL >52m59m  Expected Outcomes  Short Term: Participant states understanding of desired cholesterol values and is compliant with medications prescribed. Participant is following exercise prescription and nutrition guidelines.;Long Term: Cholesterol controlled with medications as prescribed, with individualized exercise RX and with personalized nutrition plan. Value goals: LDL < 70mg78mL > 40 mg.       Core Components/Risk Factors/Patient Goals Review:    Core Components/Risk Factors/Patient Goals at Discharge (Final Review):    ITP Comments: ITP Comments    Row Name 03/19/18 1304           ITP Comments  Med review completed today. Initial ITP sent to Dr M MilLoleta Chancereview,changes as needed and signature. Diagnosis documentation can be found in [facility]          Comments:

## 2018-03-21 ENCOUNTER — Telehealth: Payer: Self-pay

## 2018-03-21 NOTE — Telephone Encounter (Signed)
Scheduled

## 2018-03-21 NOTE — Telephone Encounter (Signed)
Pt has been scheduled for a follow up appt on 04/06/2018 Juliann Pulse coul dyou please schedule this appt. Thanks   appt is at 4:30 PM per [name] OK to schedule

## 2018-03-26 DIAGNOSIS — I110 Hypertensive heart disease with heart failure: Secondary | ICD-10-CM | POA: Diagnosis not present

## 2018-03-26 DIAGNOSIS — I208 Other forms of angina pectoris: Secondary | ICD-10-CM | POA: Diagnosis not present

## 2018-03-26 DIAGNOSIS — I5032 Chronic diastolic (congestive) heart failure: Secondary | ICD-10-CM | POA: Diagnosis not present

## 2018-03-26 LAB — GLUCOSE, CAPILLARY
Glucose-Capillary: 248 mg/dL — ABNORMAL HIGH (ref 70–99)
Glucose-Capillary: 244 mg/dL — ABNORMAL HIGH (ref 70–99)

## 2018-03-26 NOTE — Progress Notes (Signed)
Daily Session Note  Patient Details  Name: KOHL POLINSKY MRN: [id number] Date of Birth: 1933/03/27 Referring Provider:     Cardiac Rehab from 03/19/2018 in [facility] Cardiac and Pulmonary Rehab  Referring Provider  Ida Rogue MD      Encounter Date: 03/26/2018  Check In: Session Check In - 03/26/18 1728      Check-In   Supervising physician immediately available to respond to emergencies  See telemetry face sheet for immediately available ER MD    Location  [facility]-Cardiac & Pulmonary Rehab    Staff Present  Gerlene Burdock, RN, Moises Blood, BS, ACSM CEP, Exercise Physiologist;Amanda Oletta Darter, [place], ACSM CEP, Exercise Physiologist    Medication changes reported      No    Fall or balance concerns reported     No    Warm-up and Cool-down  Performed as group-led instruction    Resistance Training Performed  Yes    VAD Patient?  No    PAD/SET Patient?  No      Pain Assessment   Currently in Pain?  No/denies    Multiple Pain Sites  No          Social History   Tobacco Use  Smoking Status Never Smoker  Smokeless Tobacco Never Used    Goals Met:  Independence with exercise equipment Exercise tolerated well No report of cardiac concerns or symptoms Strength training completed today  Goals Unmet:  Not Applicable  Comments: First full day of exercise!  Patient was oriented to gym and equipment including functions, settings, policies, and procedures.  Patient's individual exercise prescription and treatment plan were reviewed.  All starting workloads were established based on the results of the 6 minute walk test done at initial orientation visit.  The plan for exercise progression was also introduced and progression will be customized based on patient's performance and goals.    Dr. Emily Filbert is Medical Director for [place] and [facility].

## 2018-03-28 ENCOUNTER — Encounter: Payer: Medicare Other | Admitting: *Deleted

## 2018-03-28 ENCOUNTER — Other Ambulatory Visit: Payer: Self-pay | Admitting: Cardiovascular Disease

## 2018-03-28 DIAGNOSIS — I208 Other forms of angina pectoris: Secondary | ICD-10-CM | POA: Diagnosis not present

## 2018-03-28 DIAGNOSIS — I110 Hypertensive heart disease with heart failure: Secondary | ICD-10-CM | POA: Diagnosis not present

## 2018-03-28 DIAGNOSIS — I5032 Chronic diastolic (congestive) heart failure: Secondary | ICD-10-CM | POA: Diagnosis not present

## 2018-03-28 LAB — GLUCOSE, CAPILLARY
Glucose-Capillary: 260 mg/dL — ABNORMAL HIGH (ref 70–99)
Glucose-Capillary: 176 mg/dL — ABNORMAL HIGH (ref 70–99)

## 2018-03-28 NOTE — Progress Notes (Signed)
Daily Session Note  Patient Details  Name: [name] [name] MRN: [id number] Date of Birth: 24-Feb-1934 Referring Provider:     Cardiac Rehab from 03/19/2018 in [facility] Cardiac and Pulmonary Rehab  Referring Provider  Ida Rogue MD      Encounter Date: 03/28/2018  Check In: Session Check In - 03/28/18 1722      Check-In   Supervising physician immediately available to respond to emergencies  See telemetry face sheet for immediately available ER MD    Location  [facility]-Cardiac & Pulmonary Rehab    Staff Present  Renita Papa, RN BSN;Carroll Enterkin, RN, Vickki Hearing, BA, ACSM CEP, Exercise Physiologist    Medication changes reported      No    Fall or balance concerns reported     No    Tobacco Cessation  No Change    Warm-up and Cool-down  Performed as group-led instruction    Resistance Training Performed  Yes    VAD Patient?  No    PAD/SET Patient?  No      Pain Assessment   Currently in Pain?  No/denies          Social History   Tobacco Use  Smoking Status Never Smoker  Smokeless Tobacco Never Used    Goals Met:  Independence with exercise equipment Exercise tolerated well No report of cardiac concerns or symptoms Strength training completed today  Goals Unmet:  Not Applicable  Comments: Pt able to follow exercise prescription today without complaint.  Will continue to monitor for progression.    Dr. Emily Filbert is Medical Director for [place] and [facility].

## 2018-04-02 ENCOUNTER — Encounter: Payer: Medicare Other | Admitting: *Deleted

## 2018-04-02 DIAGNOSIS — I208 Other forms of angina pectoris: Secondary | ICD-10-CM

## 2018-04-02 DIAGNOSIS — I5032 Chronic diastolic (congestive) heart failure: Secondary | ICD-10-CM | POA: Diagnosis not present

## 2018-04-02 DIAGNOSIS — I110 Hypertensive heart disease with heart failure: Secondary | ICD-10-CM | POA: Diagnosis not present

## 2018-04-02 LAB — GLUCOSE, CAPILLARY
Glucose-Capillary: 254 mg/dL — ABNORMAL HIGH (ref 70–99)
Glucose-Capillary: 234 mg/dL — ABNORMAL HIGH (ref 70–99)

## 2018-04-02 NOTE — Progress Notes (Signed)
Daily Session Note  Patient Details  Name: [name] [name] MRN: [id number] Date of Birth: 1933-03-11 Referring Provider:     Cardiac Rehab from 03/19/2018 in [facility] Cardiac and Pulmonary Rehab  Referring Provider  Ida Rogue MD      Encounter Date: 04/02/2018  Check In: Session Check In - 04/02/18 1715      Check-In   Supervising physician immediately available to respond to emergencies  See telemetry face sheet for immediately available ER MD    Location  [facility]-Cardiac & Pulmonary Rehab    Staff Present  Earlean Shawl, BS, ACSM CEP, Exercise Physiologist;Carroll Enterkin, RN, Vickki Hearing, BA, ACSM CEP, Exercise Physiologist    Medication changes reported      No    Fall or balance concerns reported     No    Tobacco Cessation  No Change    Warm-up and Cool-down  Performed as group-led instruction    Resistance Training Performed  Yes    VAD Patient?  No    PAD/SET Patient?  No      Pain Assessment   Currently in Pain?  No/denies    Multiple Pain Sites  No          Social History   Tobacco Use  Smoking Status Never Smoker  Smokeless Tobacco Never Used    Goals Met:  Independence with exercise equipment Exercise tolerated well No report of cardiac concerns or symptoms Strength training completed today  Goals Unmet:  Not Applicable  Comments: Pt able to follow exercise prescription today without complaint.  Will continue to monitor for progression.    Dr. Emily Filbert is Medical Director for [place] and [facility].

## 2018-04-04 ENCOUNTER — Encounter: Payer: Self-pay | Admitting: *Deleted

## 2018-04-04 ENCOUNTER — Encounter: Payer: Medicare Other | Admitting: *Deleted

## 2018-04-04 DIAGNOSIS — I5032 Chronic diastolic (congestive) heart failure: Secondary | ICD-10-CM | POA: Diagnosis not present

## 2018-04-04 DIAGNOSIS — I208 Other forms of angina pectoris: Secondary | ICD-10-CM | POA: Diagnosis not present

## 2018-04-04 DIAGNOSIS — I110 Hypertensive heart disease with heart failure: Secondary | ICD-10-CM | POA: Diagnosis not present

## 2018-04-04 NOTE — Progress Notes (Signed)
Daily Session Note  Patient Details  Name: [name] [name] MRN: [id number] Date of Birth: [birth year] Referring Provider:     Cardiac Rehab from 03/19/2018 in [facility] Cardiac and Pulmonary Rehab  Referring Provider  Ida Rogue MD      Encounter Date: 04/04/2018  Check In: Session Check In - 04/04/18 1626      Check-In   Supervising physician immediately available to respond to emergencies  See telemetry face sheet for immediately available ER MD    Location  [facility]-Cardiac & Pulmonary Rehab    Staff Present  Renita Papa, RN BSN;Carroll Enterkin, RN, Vickki Hearing, BA, ACSM CEP, Exercise Physiologist    Medication changes reported      No    Fall or balance concerns reported     No    Tobacco Cessation  No Change    Warm-up and Cool-down  Performed as group-led instruction    Resistance Training Performed  Yes    VAD Patient?  No    PAD/SET Patient?  No      Pain Assessment   Currently in Pain?  No/denies          Social History   Tobacco Use  Smoking Status Never Smoker  Smokeless Tobacco Never Used    Goals Met:  Independence with exercise equipment Exercise tolerated well No report of cardiac concerns or symptoms Strength training completed today  Goals Unmet:  Not Applicable  Comments: Pt able to follow exercise prescription today without complaint.  Will continue to monitor for progression.    Dr. Emily Filbert is Medical Director for [place] and [facility].

## 2018-04-04 NOTE — Progress Notes (Signed)
Cardiac Individual Treatment Plan  Patient Details  Name: SEELEY HISSONG MRN: [id number] Date of Birth: [birth year] Referring Provider:     Cardiac Rehab from 03/19/2018 in [facility] Cardiac and Pulmonary Rehab  Referring Provider  Ida Rogue MD      Initial Encounter Date:    Cardiac Rehab from 03/19/2018 in [facility] Cardiac and Pulmonary Rehab  Date  03/19/18      Visit Diagnosis: Stable angina ([place])  Patient's Home Medications on Admission:  Current Outpatient Medications:  .  acetaminophen (TYLENOL) 500 MG tablet, Take 1,000 mg by mouth 2 (two) times daily., Disp: , Rfl:  .  amLODipine (NORVASC) 5 MG tablet, Take 1 tablet (5 mg total) by mouth 2 (two) times daily., Disp: 90 tablet, Rfl: 3 .  aspirin EC 81 MG tablet, Take 81 mg by mouth daily., Disp: , Rfl:  .  BD INSULIN SYRINGE ULTRAFINE 31G X 15/64" 0.3 ML MISC, USE AS DIRECTED, Disp: 100 each, Rfl: 3 .  blood glucose meter kit and supplies KIT, OneTouch Verio w/ Device Kit OneTouch Verio VI STRP OneTouch Delica Lancet Dev misc OneTouch Delica Lancets 06Y or Fine  Please check CBGs fasting and 2 other times during the day  Dx Code E11.8, Disp: 1 each, Rfl: 0 .  carvedilol (COREG) 12.5 MG tablet, TAKE 1 TABLET BY MOUTH TWICE DAILY, Disp: 180 tablet, Rfl: 4 .  finasteride (PROSCAR) 5 MG tablet, Take 1 tablet (5 mg total) by mouth daily. (Patient taking differently: Take 5 mg by mouth at bedtime. ), Disp: 90 tablet, Rfl: 1 .  furosemide (LASIX) 20 MG tablet, Take 1 tablet (20 mg) by mouth twice daily 6 days a week, Disp: , Rfl:  .  gabapentin (NEURONTIN) 300 MG capsule, Take 1 capsule (300 mg total) by mouth at bedtime. (Patient not taking: Reported on 03/19/2018), Disp: 30 capsule, Rfl: 11 .  Insulin Pen Needle (BD PEN NEEDLE NANO U/F) 32G X 4 MM MISC, USE AS DIRECTED, Disp: 100 each, Rfl: 11 .  omeprazole (PRILOSEC) 20 MG capsule, TAKE 1 CAPSULE BY MOUTH TWICE DAILY, Disp: 60 capsule, Rfl: 2 .  potassium chloride (K-DUR) 10 MEQ tablet,  Take 1 tablet (10 meq) by mouth twice daily 6 days a week, Disp: , Rfl:  .  ramipril (ALTACE) 10 MG capsule, Take 1 capsule (10 mg total) by mouth 2 (two) times daily., Disp: 180 capsule, Rfl: 4 .  rosuvastatin (CRESTOR) 20 MG tablet, Take 1 tablet (20 mg total) by mouth daily., Disp: 90 tablet, Rfl: 4 .  TOUJEO SOLOSTAR 300 UNIT/ML SOPN, INJECT 50-60 UNITS INTO THE SKIN DAILY .INCREASE BY 1 UNITS/DAY UNTILFBS ARE BETWEEN 80-130. DO NOT EXCEED 60 UNITS PER DAY. (Patient taking differently: Inject 46-48 Units into the skin daily. ), Disp: 4.5 mL, Rfl: 6 .  traMADol (ULTRAM) 50 MG tablet, Take 1 tablet (50 mg total) by mouth 2 (two) times daily as needed. (Patient taking differently: Take 50 mg by mouth 2 (two) times daily as needed (for pain.). ), Disp: 10 tablet, Rfl: 0 .  traZODone (DESYREL) 50 MG tablet, TAKE ONE TO TWO TABLETS BY MOUTH AT BEDTIME AS NEEDED FOR SLEEP, Disp: 90 tablet, Rfl: 3 .  vitamin B-12 (CYANOCOBALAMIN) 50 MCG tablet, Take 50 mcg by mouth daily., Disp: , Rfl:  .  Vitamin D, Cholecalciferol, 1000 units TABS, Take 1,000 Units by mouth daily. , Disp: , Rfl:   Past Medical History: Past Medical History:  Diagnosis Date  . BPH (benign prostatic hyperplasia)   .  Cancer ([place])    skin  . Cataract   . Coronary artery disease    a. 4v CABG 01/2005; b. cath 06/05/13 showed patent grafts: LIMA-LAD, VG-D, VG-OM3, VG-dRCA, nl filling pressures, nl LV fxn  . Diastolic dysfunction    a. echo 08/7617: nl systolic fxn, mild LVH, diastolic relaxation abnormality, mildly enlarged LA, mild Ao insufficiency  . Dyspnea    with exertion  . ED (erectile dysfunction)   . GERD (gastroesophageal reflux disease)   . Hyperlipidemia   . Hypertension   . 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 Osteoarthritis   . Perirectal fistula   . Pneumonia 12/2016  . Tubular adenoma of colon 07/2012  . Vertigo     Tobacco Use: Social History   Tobacco Use  Smoking Status Never Smoker   Smokeless Tobacco Never Used    Labs: Recent Review Flowsheet Data    Labs for ITP Cardiac and Pulmonary Rehab Latest Ref Rng & Units 06/02/2016 08/02/2016 11/09/2016 08/18/2017 12/27/2017   Cholestrol 100 - 199 mg/dL 121 - - 99 125   LDLCALC 0 - 99 mg/dL - - - 41 -   LDLDIRECT mg/dL 65.0 - - - -   HDL >39.00 mg/dL 32.50(L) - - 31.00(L) -   Trlycerides 0 - 149 mg/dL 260.0(H) - - 137.0 289(H)   Hemoglobin A1c 4.6 - 6.5 % - 8.4 8.3 7.7(H) 6.9(H)   PHART 7.350 - 7.450 - - - - -   PCO2ART 35.0 - 45.0 mmHg - - - - -   HCO3 20.0 - 24.0 mEq/L - - - - -   TCO2 0 - 100 mmol/L - - - - -   O2SAT % - - - - -       Exercise Target Goals: Exercise Program Goal: Individual exercise prescription set using results from initial 6 min walk test and THRR while considering  patient's activity barriers and safety.   Exercise Prescription Goal: Initial exercise prescription builds to 30-45 minutes a day of aerobic activity, 2-3 days per week.  Home exercise guidelines will be given to patient during program as part of exercise prescription that the participant will acknowledge.  Activity Barriers & Risk Stratification: Activity Barriers & Cardiac Risk Stratification - 03/19/18 1246      Activity Barriers & Cardiac Risk Stratification   Activity Barriers  Back Problems;Deconditioning;Muscular Weakness;Shortness of Breath;Balance Concerns   Occasional vertigo   Cardiac Risk Stratification  High       6 Minute Walk: 6 Minute Walk    Row Name 03/19/18 1245         6 Minute Walk   Phase  Initial     Distance  965 feet     Walk Time  6 minutes     # of Rest Breaks  0     MPH  1.83     METS  1.5     RPE  15     Perceived Dyspnea   1     VO2 Peak  5.25     Symptoms  Yes (comment)     Comments  hip pain 7-8/10, SOB     Resting HR  58 bpm     Resting BP  124/56     Resting Oxygen Saturation   97 %     Exercise Oxygen Saturation  during 6 min walk  97 %     Max Ex. HR  84 bpm     Max Ex. BP  144/74     2 Minute Post BP  120/64        Oxygen Initial Assessment:   Oxygen Re-Evaluation:   Oxygen Discharge (Final Oxygen Re-Evaluation):   Initial Exercise Prescription: Initial Exercise Prescription - 03/19/18 1200      Date of Initial Exercise RX and Referring Provider   Date  03/19/18    Referring Provider  Ida Rogue MD      Treadmill   MPH  1.5    Grade  0.5    Minutes  15    METs  2.25      NuStep   Level  2    SPM  80    Minutes  15    METs  2      REL-XR   Level  1    Speed  50    Minutes  15    METs  2      Prescription Details   Frequency (times per week)  3    Duration  Progress to 45 minutes of aerobic exercise without signs/symptoms of physical distress      Intensity   THRR 40-80% of Max Heartrate  90-122    Ratings of Perceived Exertion  11-13    Perceived Dyspnea  0-4      Progression   Progression  Continue to progress workloads to maintain intensity without signs/symptoms of physical distress.      Resistance Training   Training Prescription  Yes    Weight  3 lbs    Reps  10-15       Perform Capillary Blood Glucose checks as needed.  Exercise Prescription Changes: Exercise Prescription Changes    Row Name 03/19/18 1200 03/29/18 0800           Response to Exercise   Blood Pressure (Admit)  124/56  134/64      Blood Pressure (Exercise)  144/74  142/64      Blood Pressure (Exit)  120/64  132/60      Heart Rate (Admit)  58 bpm  70 bpm      Heart Rate (Exercise)  84 bpm  75 bpm      Heart Rate (Exit)  62 bpm  68 bpm      Oxygen Saturation (Admit)  97 %  -      Oxygen Saturation (Exercise)  97 %  -      Rating of Perceived Exertion (Exercise)  15  11      Perceived Dyspnea (Exercise)  1  -      Symptoms  hip pain 7-8/10; SOB  -      Comments  walk test results  second day      Duration  -  Progress to 45 minutes of aerobic exercise without signs/symptoms of physical distress      Intensity  -  THRR unchanged         Progression   Progression  -  Continue to progress workloads to maintain intensity without signs/symptoms of physical distress.      Average METs  -  2.5        Resistance Training   Training Prescription  -  Yes      Weight  -  3 lb      Reps  -  10-15        Interval Training   Interval Training  -  No        REL-XR   Level  -  1      Speed  -  50      Minutes  -  15      METs  -  2.6         Exercise Comments: Exercise Comments    Row Name 03/26/18 1729           Exercise Comments  First full day of exercise!  Patient was oriented to gym and equipment including functions, settings, policies, and procedures.  Patient's individual exercise prescription and treatment plan were reviewed.  All starting workloads were established based on the results of the 6 minute walk test done at initial orientation visit.  The plan for exercise progression was also introduced and progression will be customized based on patient's performance and goals.          Exercise Goals and Review: Exercise Goals    Row Name 03/19/18 1248             Exercise Goals   Increase Physical Activity  Yes       Intervention  Provide advice, education, support and counseling about physical activity/exercise needs.;Develop an individualized exercise prescription for aerobic and resistive training based on initial evaluation findings, risk stratification, comorbidities and participant's personal goals.       Expected Outcomes  Short Term: Attend rehab on a regular basis to increase amount of physical activity.;Long Term: Add in home exercise to make exercise part of routine and to increase amount of physical activity.;Long Term: Exercising regularly at least 3-5 days a week.       Increase Strength and Stamina  Yes       Intervention  Provide advice, education, support and counseling about physical activity/exercise needs.;Develop an individualized exercise prescription for aerobic and resistive training  based on initial evaluation findings, risk stratification, comorbidities and participant's personal goals.       Expected Outcomes  Short Term: Increase workloads from initial exercise prescription for resistance, speed, and METs.;Short Term: Perform resistance training exercises routinely during rehab and add in resistance training at home;Long Term: Improve cardiorespiratory fitness, muscular endurance and strength as measured by increased METs and functional capacity (6MWT)       Able to understand and use rate of perceived exertion (RPE) scale  Yes       Intervention  Provide education and explanation on how to use RPE scale       Expected Outcomes  Short Term: Able to use RPE daily in rehab to express subjective intensity level;Long Term:  Able to use RPE to guide intensity level when exercising independently       Able to understand and use Dyspnea scale  Yes       Intervention  Provide education and explanation on how to use Dyspnea scale       Expected Outcomes  Short Term: Able to use Dyspnea scale daily in rehab to express subjective sense of shortness of breath during exertion;Long Term: Able to use Dyspnea scale to guide intensity level when exercising independently       Knowledge and understanding of Target Heart Rate Range (THRR)  Yes       Intervention  Provide education and explanation of THRR including how the numbers were predicted and where they are located for reference       Expected Outcomes  Short Term: Able to state/look up THRR;Short Term: Able to use daily as guideline for intensity in rehab;Long Term: Able to use THRR to govern intensity when exercising  independently       Able to check pulse independently  Yes       Intervention  Provide education and demonstration on how to check pulse in carotid and radial arteries.;Review the importance of being able to check your own pulse for safety during independent exercise       Expected Outcomes  Short Term: Able to explain why  pulse checking is important during independent exercise;Long Term: Able to check pulse independently and accurately       Understanding of Exercise Prescription  Yes       Intervention  Provide education, explanation, and written materials on patient's individual exercise prescription       Expected Outcomes  Short Term: Able to explain program exercise prescription;Long Term: Able to explain home exercise prescription to exercise independently          Exercise Goals Re-Evaluation : Exercise Goals Re-Evaluation    Row Name 03/26/18 1729             Exercise Goal Re-Evaluation   Exercise Goals Review  Increase Physical Activity;Able to understand and use rate of perceived exertion (RPE) scale;Knowledge and understanding of Target Heart Rate Range (THRR);Understanding of Exercise Prescription;Increase Strength and Stamina;Able to understand and use Dyspnea scale       Comments  Reviewed RPE scale, THR and program prescription with pt today.  Pt voiced understanding and was given a copy of goals to take home.        Expected Outcomes  Short: Use RPE daily to regulate intensity. Long: Follow program prescription in THR.          Discharge Exercise Prescription (Final Exercise Prescription Changes): Exercise Prescription Changes - 03/29/18 0800      Response to Exercise   Blood Pressure (Admit)  134/64    Blood Pressure (Exercise)  142/64    Blood Pressure (Exit)  132/60    Heart Rate (Admit)  70 bpm    Heart Rate (Exercise)  75 bpm    Heart Rate (Exit)  68 bpm    Rating of Perceived Exertion (Exercise)  11    Comments  second day    Duration  Progress to 45 minutes of aerobic exercise without signs/symptoms of physical distress    Intensity  THRR unchanged      Progression   Progression  Continue to progress workloads to maintain intensity without signs/symptoms of physical distress.    Average METs  2.5      Resistance Training   Training Prescription  Yes    Weight  3 lb     Reps  10-15      Interval Training   Interval Training  No      REL-XR   Level  1    Speed  50    Minutes  15    METs  2.6       Nutrition:  Target Goals: Understanding of nutrition guidelines, daily intake of sodium <1553m, cholesterol <2025m calories 30% from fat and 7% or less from saturated fats, daily to have 5 or more servings of fruits and vegetables.  Biometrics: Pre Biometrics - 03/19/18 1249      Pre Biometrics   Height  5' 11.9" (1.826 m)    Weight  220 lb (99.8 kg)    Waist Circumference  42.5 inches    Hip Circumference  43.5 inches    Waist to Hip Ratio  0.98 %    BMI (Calculated)  29.93  Single Leg Stand  6.17 seconds        Nutrition Therapy Plan and Nutrition Goals: Nutrition Therapy & Goals - 03/19/18 1319      Intervention Plan   Intervention  Prescribe, educate and counsel regarding individualized specific dietary modifications aiming towards targeted core components such as weight, hypertension, lipid management, diabetes, heart failure and other comorbidities.    Expected Outcomes  Short Term Goal: Understand basic principles of dietary content, such as calories, fat, sodium, cholesterol and nutrients.;Short Term Goal: A plan has been developed with personal nutrition goals set during dietitian appointment.;Long Term Goal: Adherence to prescribed nutrition plan.       Nutrition Assessments: Nutrition Assessments - 03/19/18 1333      Rate Your Plate Scores   Pre Score  31       Nutrition Goals Re-Evaluation:   Nutrition Goals Discharge (Final Nutrition Goals Re-Evaluation):   Psychosocial: Target Goals: Acknowledge presence or absence of significant depression and/or stress, maximize coping skills, provide positive support system. Participant is able to verbalize types and ability to use techniques and skills needed for reducing stress and depression.   Initial Review & Psychosocial Screening: Initial Psych Review & Screening -  03/19/18 1318      Initial Review   Current issues with  Current Sleep Concerns   Not able to fall asleep.  Best sleep is between 6 to 9 AM     [place]?  Yes   Spouse     Barriers   Psychosocial barriers to participate in program  There are no identifiable barriers or psychosocial needs.;The patient should benefit from training in stress management and relaxation.      Screening Interventions   Interventions  Encouraged to exercise;To provide support and resources with identified psychosocial needs;Provide feedback about the scores to participant    Expected Outcomes  Short Term goal: Utilizing psychosocial counselor, staff and physician to assist with identification of specific Stressors or current issues interfering with healing process. Setting desired goal for each stressor or current issue identified.;Long Term Goal: Stressors or current issues are controlled or eliminated.;Short Term goal: Identification and review with participant of any Quality of Life or Depression concerns found by scoring the questionnaire.;Long Term goal: The participant improves quality of Life and PHQ9 Scores as seen by post scores and/or verbalization of changes       Quality of Life Scores:  Quality of Life - 03/19/18 1318      Quality of Life   Select  Quality of Life      Quality of Life Scores   Health/Function Pre  19.6 %    Socioeconomic Pre  30 %    Psych/Spiritual Pre  30 %    Family Pre  27.6 %    GLOBAL Pre  25.2 %      Scores of 19 and below usually indicate a poorer quality of life in these areas.  A difference of  2-3 points is a clinically meaningful difference.  A difference of 2-3 points in the total score of the Quality of Life Index has been associated with significant improvement in overall quality of life, self-image, physical symptoms, and general health in studies assessing change in quality of life.  PHQ-9: Recent Review Flowsheet Data     Depression screen [facility] 2/9 03/19/2018 03/05/2018 12/26/2017 05/06/2016 10/05/2015   Decreased Interest 0 0 0 0 0   Down, Depressed, Hopeless 0 0 0 0 0  PHQ - 2 Score 0 0 0 0 0   Altered sleeping 1 - - 2 1   Tired, decreased energy 1 - - 2 2   Change in appetite 1 - - 1 2   Feeling bad or failure about yourself  0 - - 0 0   Trouble concentrating 0 - - 0 0   Moving slowly or fidgety/restless 0 - - 0 0   Suicidal thoughts 0 - - 0 0   PHQ-9 Score 3 - - 5 5   Difficult doing work/chores - - - Not difficult at all Not difficult at all     Interpretation of Total Score  Total Score Depression Severity:  1-4 = Minimal depression, 5-9 = Mild depression, 10-14 = Moderate depression, 15-19 = Moderately severe depression, 20-27 = Severe depression   Psychosocial Evaluation and Intervention: Psychosocial Evaluation - 03/28/18 1715      Psychosocial Evaluation & Interventions   Interventions  Encouraged to exercise with the program and follow exercise prescription    Comments  Counselor met with Mr. Gerke [facility]) today as he has returned to this program after more than a year of being out.  This initial psychosocial evaluation indicates an 85 year old who has a history of heart disease - including CABGx5 13 years ago.  He has a strong support system with a spouse of 10 years; (4) adult children and active involvement in his local church.  [name] also struggles with type 2 diabetes.  He reports not sleeping well but gets about 6-8 hours of interrupted sleep and his appetite is not very good - but he is not losing weight at this time.  He denies a history of depression or anxiety or any current symptoms and is typically in a positive mood most of the time.  He has goals to breathe better; increase his stamina and maybe lose some weight this time while in this program.  Staff will follow with him.     Expected Outcomes  Short:  [name] will exercise to increase his ability to breathe and improve his stamina.  He will  meet with the dietician to address his weight loss goals.  Long:  [name] will exercise consistently for his health and for weight loss.      Continue Psychosocial Services   Follow up required by staff       Psychosocial Re-Evaluation:   Psychosocial Discharge (Final Psychosocial Re-Evaluation):   Vocational Rehabilitation: Provide vocational rehab assistance to qualifying candidates.   Vocational Rehab Evaluation & Intervention: Vocational Rehab - 03/19/18 1320      Initial Vocational Rehab Evaluation & Intervention   Assessment shows need for Vocational Rehabilitation  No       Education: Education Goals: Education classes will be provided on a variety of topics geared toward better understanding of heart health and risk factor modification. Participant will state understanding/return demonstration of topics presented as noted by education test scores.  Learning Barriers/Preferences: Learning Barriers/Preferences - 03/19/18 1320      Learning Barriers/Preferences   Learning Barriers  None    Learning Preferences  None       Education Topics:  AED/CPR: - Group verbal and written instruction with the use of models to demonstrate the basic use of the AED with the basic ABC's of resuscitation.   General Nutrition Guidelines/Fats and Fiber: -Group instruction provided by verbal, written material, models and posters to present the general guidelines for heart healthy nutrition. Gives an explanation and review  of dietary fats and fiber.   Controlling Sodium/Reading Food Labels: -Group verbal and written material supporting the discussion of sodium use in heart healthy nutrition. Review and explanation with models, verbal and written materials for utilization of the food label.   Exercise Physiology & General Exercise Guidelines: - Group verbal and written instruction with models to review the exercise physiology of the cardiovascular system and associated critical values.  Provides general exercise guidelines with specific guidelines to those with heart or lung disease.    [facility]  Date  03/26/18  Educator  [facility]  Instruction Review Code  1- Verbalizes Understanding      Aerobic Exercise & Resistance Training: - Gives group verbal and written instruction on the various components of exercise. Focuses on aerobic and resistive training programs and the benefits of this training and how to safely progress through these programs..   Cardiac Rehab from 04/02/2018 in [facility] Cardiac and Pulmonary Rehab  Date  03/28/18  Educator  AS  Instruction Review Code  1- Verbalizes Understanding      Flexibility, Balance, Mind/Body Relaxation: Provides group verbal/written instruction on the benefits of flexibility and balance training, including mind/body exercise modes such as yoga, pilates and tai chi.  Demonstration and skill practice provided.   Cardiac Rehab from 04/02/2018 in [facility] Cardiac and Pulmonary Rehab  Date  04/02/18  Educator  AS  Instruction Review Code  1- Verbalizes Understanding      Stress and Anxiety: - Provides group verbal and written instruction about the health risks of elevated stress and causes of high stress.  Discuss the correlation between heart/lung disease and anxiety and treatment options. Review healthy ways to manage with stress and anxiety.   Cardiac Rehab from 04/18/2016 in [facility] Cardiac and Pulmonary Rehab  Date  04/13/16  Educator  [facility]  Instruction Review Code (retired)  2- meets goals/outcomes      Depression: - Provides group verbal and written instruction on the correlation between heart/lung disease and depressed mood, treatment options, and the stigmas associated with seeking treatment.   Anatomy & Physiology of the Heart: - Group verbal and written instruction and models provide basic cardiac anatomy and physiology, with the coronary electrical and arterial systems. Review  of Valvular disease and Heart Failure   Cardiac Rehab from 04/18/2016 in [facility] Cardiac and Pulmonary Rehab  Date  02/08/16  Educator  [facility]  Instruction Review Code (retired)  2- meets goals/outcomes      Cardiac Procedures: - Group verbal and written instruction to review commonly prescribed medications for heart disease. Reviews the medication, class of the drug, and side effects. Includes the steps to properly store meds and maintain the prescription regimen. (beta blockers and nitrates)   Cardiac Rehab from 04/18/2016 in [facility] Cardiac and Pulmonary Rehab  Date  04/18/16  Educator  CE  Instruction Review Code (retired)  2- meets goals/outcomes      Cardiac Medications I: - Group verbal and written instruction to review commonly prescribed medications for heart disease. Reviews the medication, class of the drug, and side effects. Includes the steps to properly store meds and maintain the prescription regimen.   Cardiac Medications II: -Group verbal and written instruction to review commonly prescribed medications for heart disease. Reviews the medication, class of the drug, and side effects. (all other drug classes)    Go Sex-Intimacy & Heart Disease, Get SMART - Goal Setting: - Group verbal and written instruction through game format  to discuss heart disease and the return to sexual intimacy. Provides group verbal and written material to discuss and apply goal setting through the application of the S.M.A.R.T. Method.   Cardiac Rehab from 04/18/2016 in [facility] Cardiac and Pulmonary Rehab  Date  04/18/16  Educator  CE  Instruction Review Code (retired)  2- meets goals/outcomes      Other Matters of the Heart: - Provides group verbal, written materials and models to describe Stable Angina and Peripheral Artery. Includes description of the disease process and treatment options available to the cardiac patient.   Cardiac Rehab from 04/18/2016 in [facility] Cardiac and Pulmonary Rehab  Date   04/11/16  Educator  C. Enterkin, RN  Instruction Review Code (retired)  2- meets goals/outcomes      Exercise & Equipment Safety: - Individual verbal instruction and demonstration of equipment use and safety with use of the equipment.   Cardiac Rehab from 04/02/2018 in [facility] Cardiac and Pulmonary Rehab  Date  03/19/18  Educator  SB  Instruction Review Code  1- Verbalizes Understanding      Infection Prevention: - Provides verbal and written material to individual with discussion of infection control including proper hand washing and proper equipment cleaning during exercise session.   Cardiac Rehab from 04/02/2018 in [facility] Cardiac and Pulmonary Rehab  Date  03/19/18  Educator  SB  Instruction Review Code  1- Verbalizes Understanding      Falls Prevention: - Provides verbal and written material to individual with discussion of falls prevention and safety.   Cardiac Rehab from 04/02/2018 in [facility] Cardiac and Pulmonary Rehab  Date  03/19/18  Educator  SB  Instruction Review Code  1- Verbalizes Understanding      Diabetes: - Individual verbal and written instruction to review signs/symptoms of diabetes, desired ranges of glucose level fasting, after meals and with exercise. Acknowledge that pre and post exercise glucose checks will be done for 3 sessions at entry of program.   Cardiac Rehab from 04/02/2018 in [facility] Cardiac and Pulmonary Rehab  Date  03/19/18  Educator  SB  Instruction Review Code  1- Verbalizes Understanding      Know Your Numbers and Risk Factors: -Group verbal and written instruction about important numbers in your health.  Discussion of what are risk factors and how they play a role in the disease process.  Review of Cholesterol, Blood Pressure, Diabetes, and BMI and the role they play in your overall health.   Sleep Hygiene: -Provides group verbal and written instruction about how sleep can affect your health.  Define sleep hygiene, discuss sleep cycles and  impact of sleep habits. Review good sleep hygiene tips.    Other: -Provides group and verbal instruction on various topics (see comments)   Knowledge Questionnaire Score: Knowledge Questionnaire Score - 03/19/18 1321      Knowledge Questionnaire Score   Pre Score  22/26   Reviewed correct response with [name] today. He verbalized understanding and had no further questions today      Core Components/Risk Factors/Patient Goals at Admission: Personal Goals and Risk Factors at Admission - 03/19/18 1322      Core Components/Risk Factors/Patient Goals on Admission    Weight Management  Yes;Obesity    Intervention  Weight Management: Develop a combined nutrition and exercise program designed to reach desired caloric intake, while maintaining appropriate intake of nutrient and fiber, sodium and fats, and appropriate energy expenditure required for the weight goal.;Weight Management/Obesity: Establish reasonable short term and long term  weight goals.    Admit Weight  220 lb (99.8 kg)    Goal Weight: Short Term  218 lb (98.9 kg)    Goal Weight: Long Term  200 lb (90.7 kg)    Expected Outcomes  Short Term: Continue to assess and modify interventions until short term weight is achieved;Long Term: Adherence to nutrition and physical activity/exercise program aimed toward attainment of established weight goal;Weight Loss: Understanding of general recommendations for a balanced deficit meal plan, which promotes 1-2 lb weight loss per week and includes a negative energy balance of [phone] kcal/d    Diabetes  Yes    Intervention  Provide education about signs/symptoms and action to take for hypo/hyperglycemia.;Provide education about proper nutrition, including hydration, and aerobic/resistive exercise prescription along with prescribed medications to achieve blood glucose in normal ranges: Fasting glucose 65-99 mg/dL    Expected Outcomes  Short Term: Participant verbalizes understanding of the  signs/symptoms and immediate care of hyper/hypoglycemia, proper foot care and importance of medication, aerobic/resistive exercise and nutrition plan for blood glucose control.;Long Term: Attainment of HbA1C < 7%.    Heart Failure  Yes    Intervention  Provide a combined exercise and nutrition program that is supplemented with education, support and counseling about heart failure. Directed toward relieving symptoms such as shortness of breath, decreased exercise tolerance, and extremity edema.    Expected Outcomes  Improve functional capacity of life;Short term: Attendance in program 2-3 days a week with increased exercise capacity. Reported lower sodium intake. Reported increased fruit and vegetable intake. Reports medication compliance.;Short term: Daily weights obtained and reported for increase. Utilizing diuretic protocols set by physician.;Long term: Adoption of self-care skills and reduction of barriers for early signs and symptoms recognition and intervention leading to self-care maintenance.    Hypertension  Yes    Intervention  Provide education on lifestyle modifcations including regular physical activity/exercise, weight management, moderate sodium restriction and increased consumption of fresh fruit, vegetables, and low fat dairy, alcohol moderation, and smoking cessation.;Monitor prescription use compliance.    Expected Outcomes  Short Term: Continued assessment and intervention until BP is < 140/88m HG in hypertensive participants. < 130/837mHG in hypertensive participants with diabetes, heart failure or chronic kidney disease.;Long Term: Maintenance of blood pressure at goal levels.    Lipids  Yes    Intervention  Provide education and support for participant on nutrition & aerobic/resistive exercise along with prescribed medications to achieve LDL <7020mHDL >82m63m  Expected Outcomes  Short Term: Participant states understanding of desired cholesterol values and is compliant with  medications prescribed. Participant is following exercise prescription and nutrition guidelines.;Long Term: Cholesterol controlled with medications as prescribed, with individualized exercise RX and with personalized nutrition plan. Value goals: LDL < 70mg60mL > 40 mg.       Core Components/Risk Factors/Patient Goals Review:    Core Components/Risk Factors/Patient Goals at Discharge (Final Review):    ITP Comments: ITP Comments    Row Name 03/19/18 1304 04/04/18 1411         ITP Comments  Med review completed today. Initial ITP sent to Dr M MilLoleta Chancereview,changes as needed and signature. Diagnosis documentation can be found in [facility]  30 day review completed. ITP sent to Dr. Mark Emily Filbertical Director of Cardiac Rehab. Continue with ITP unless changes are made by physician.         Comments: 30 day review

## 2018-04-05 DIAGNOSIS — I208 Other forms of angina pectoris: Secondary | ICD-10-CM | POA: Diagnosis not present

## 2018-04-05 DIAGNOSIS — I110 Hypertensive heart disease with heart failure: Secondary | ICD-10-CM | POA: Diagnosis not present

## 2018-04-05 DIAGNOSIS — I5032 Chronic diastolic (congestive) heart failure: Secondary | ICD-10-CM | POA: Diagnosis not present

## 2018-04-05 NOTE — Progress Notes (Signed)
Daily Session Note  Patient Details  Name: [name] [name] MRN: [id number] Date of Birth: 10/15/1933 Referring Provider:     Cardiac Rehab from 03/19/2018 in [facility] Cardiac and Pulmonary Rehab  Referring Provider  Ida Rogue MD      Encounter Date: 04/05/2018  Check In:      Social History   Tobacco Use  Smoking Status Never Smoker  Smokeless Tobacco Never Used    Goals Met:  Independence with exercise equipment Exercise tolerated well No report of cardiac concerns or symptoms Strength training completed today  Goals Unmet:  Not Applicable  Comments: Pt able to follow exercise prescription today without complaint.  Will continue to monitor for progression.    Dr. Emily Filbert is Medical Director for [place] and [facility].

## 2018-04-06 ENCOUNTER — Ambulatory Visit: Payer: Medicare Other | Admitting: Family Medicine

## 2018-04-09 ENCOUNTER — Encounter: Payer: Medicare Other | Attending: Cardiovascular Disease

## 2018-04-09 DIAGNOSIS — I5032 Chronic diastolic (congestive) heart failure: Secondary | ICD-10-CM | POA: Insufficient documentation

## 2018-04-09 DIAGNOSIS — I110 Hypertensive heart disease with heart failure: Secondary | ICD-10-CM | POA: Insufficient documentation

## 2018-04-09 DIAGNOSIS — I208 Other forms of angina pectoris: Secondary | ICD-10-CM | POA: Insufficient documentation

## 2018-04-10 DIAGNOSIS — J069 Acute upper respiratory infection, unspecified: Secondary | ICD-10-CM | POA: Diagnosis not present

## 2018-04-10 DIAGNOSIS — J0100 Acute maxillary sinusitis, unspecified: Secondary | ICD-10-CM | POA: Diagnosis not present

## 2018-04-11 ENCOUNTER — Telehealth: Payer: Self-pay

## 2018-04-11 NOTE — Telephone Encounter (Signed)
[name] called to say he is still sick and will not be in today.  He saw his Dr yesterday and is taking meds.  He plans to return when he feels better.

## 2018-04-17 ENCOUNTER — Ambulatory Visit: Payer: Medicare Other | Admitting: Family Medicine

## 2018-04-25 ENCOUNTER — Encounter: Payer: Medicare Other | Admitting: *Deleted

## 2018-04-25 DIAGNOSIS — I208 Other forms of angina pectoris: Secondary | ICD-10-CM | POA: Diagnosis not present

## 2018-04-25 DIAGNOSIS — I110 Hypertensive heart disease with heart failure: Secondary | ICD-10-CM | POA: Diagnosis not present

## 2018-04-25 DIAGNOSIS — I5032 Chronic diastolic (congestive) heart failure: Secondary | ICD-10-CM | POA: Diagnosis not present

## 2018-04-25 NOTE — Progress Notes (Signed)
Daily Session Note  Patient Details  Name: DELAND SLOCUMB MRN: [id number] Date of Birth: November 03, 1933 Referring Provider:     Cardiac Rehab from 03/19/2018 in [facility] Cardiac and Pulmonary Rehab  Referring Provider  Ida Rogue MD      Encounter Date: 04/25/2018  Check In: Session Check In - 04/25/18 1623      Check-In   Supervising physician immediately available to respond to emergencies  See telemetry face sheet for immediately available ER MD    Location  [facility]-Cardiac & Pulmonary Rehab    Staff Present  Renita Papa, RN BSN;Carroll Enterkin, RN, Vickki Hearing, BA, ACSM CEP, Exercise Physiologist    Medication changes reported      No    Fall or balance concerns reported     No    Tobacco Cessation  No Change    Warm-up and Cool-down  Performed as group-led instruction    Resistance Training Performed  Yes    VAD Patient?  No    PAD/SET Patient?  No      Pain Assessment   Currently in Pain?  No/denies          Social History   Tobacco Use  Smoking Status Never Smoker  Smokeless Tobacco Never Used    Goals Met:  Independence with exercise equipment Exercise tolerated well No report of cardiac concerns or symptoms Strength training completed today  Goals Unmet:  Not Applicable  Comments: Pt able to follow exercise prescription today without complaint.  Will continue to monitor for progression.    Dr. Emily Filbert is Medical Director for [place] and [facility].

## 2018-04-30 DIAGNOSIS — I5032 Chronic diastolic (congestive) heart failure: Secondary | ICD-10-CM | POA: Diagnosis not present

## 2018-04-30 DIAGNOSIS — I110 Hypertensive heart disease with heart failure: Secondary | ICD-10-CM | POA: Diagnosis not present

## 2018-04-30 DIAGNOSIS — I208 Other forms of angina pectoris: Secondary | ICD-10-CM | POA: Diagnosis not present

## 2018-04-30 NOTE — Progress Notes (Signed)
Daily Session Note  Patient Details  Name: [name] [name] MRN: [id number] Date of Birth: 1934/03/02 Referring Provider:     Cardiac Rehab from 03/19/2018 in [facility] Cardiac and Pulmonary Rehab  Referring Provider  Ida Rogue MD      Encounter Date: 04/30/2018  Check In: Session Check In - 04/30/18 1750      Check-In   Supervising physician immediately available to respond to emergencies  See telemetry face sheet for immediately available ER MD    Location  [facility]-Cardiac & Pulmonary Rehab    Staff Present  Gerlene Burdock, RN, Moises Blood, BS, ACSM CEP, Exercise Physiologist;Caniya Tagle Oletta Darter, [place], ACSM CEP, Exercise Physiologist    Medication changes reported      No    Fall or balance concerns reported     No    Warm-up and Cool-down  Performed as group-led instruction    Resistance Training Performed  Yes    VAD Patient?  No    PAD/SET Patient?  No      Pain Assessment   Currently in Pain?  No/denies    Multiple Pain Sites  No          Social History   Tobacco Use  Smoking Status Never Smoker  Smokeless Tobacco Never Used    Goals Met:  Independence with exercise equipment Exercise tolerated well No report of cardiac concerns or symptoms Strength training completed today  Goals Unmet:  Not Applicable  Comments: Pt able to follow exercise prescription today without complaint.  Will continue to monitor for progression.    Dr. Emily Filbert is Medical Director for [place] and [facility].

## 2018-05-02 ENCOUNTER — Encounter: Payer: Self-pay | Admitting: *Deleted

## 2018-05-02 DIAGNOSIS — I208 Other forms of angina pectoris: Secondary | ICD-10-CM

## 2018-05-02 DIAGNOSIS — I110 Hypertensive heart disease with heart failure: Secondary | ICD-10-CM | POA: Diagnosis not present

## 2018-05-02 DIAGNOSIS — I5032 Chronic diastolic (congestive) heart failure: Secondary | ICD-10-CM | POA: Diagnosis not present

## 2018-05-02 NOTE — Progress Notes (Signed)
Daily Session Note  Patient Details  Name: [name] [name] MRN: [id number] Date of Birth: Jun 18, 1933 Referring Provider:     Cardiac Rehab from 03/19/2018 in [facility] Cardiac and Pulmonary Rehab  Referring Provider  Ida Rogue MD      Encounter Date: 05/02/2018  Check In: Session Check In - 05/02/18 1745      Check-In   Supervising physician immediately available to respond to emergencies  See telemetry face sheet for immediately available ER MD    Location  [facility]-Cardiac & Pulmonary Rehab    Staff Present  Renita Papa, RN BSN;Carroll Enterkin, RN, Vickki Hearing, BA, ACSM CEP, Exercise Physiologist          Social History   Tobacco Use  Smoking Status Never Smoker  Smokeless Tobacco Never Used    Goals Met:  Independence with exercise equipment Exercise tolerated well No report of cardiac concerns or symptoms Strength training completed today  Goals Unmet:  Not Applicable  Comments: Pt able to follow exercise prescription today without complaint.  Will continue to monitor for progression.    Dr. Emily Filbert is Medical Director for [place] and [facility].

## 2018-05-02 NOTE — Progress Notes (Signed)
Cardiac Individual Treatment Plan  Patient Details  Name: SEELEY HISSONG MRN: [id number] Date of Birth: [birth year] Referring Provider:     Cardiac Rehab from 03/19/2018 in [facility] Cardiac and Pulmonary Rehab  Referring Provider  Ida Rogue MD      Initial Encounter Date:    Cardiac Rehab from 03/19/2018 in [facility] Cardiac and Pulmonary Rehab  Date  03/19/18      Visit Diagnosis: Stable angina ([place])  Patient's Home Medications on Admission:  Current Outpatient Medications:  .  acetaminophen (TYLENOL) 500 MG tablet, Take 1,000 mg by mouth 2 (two) times daily., Disp: , Rfl:  .  amLODipine (NORVASC) 5 MG tablet, Take 1 tablet (5 mg total) by mouth 2 (two) times daily., Disp: 90 tablet, Rfl: 3 .  aspirin EC 81 MG tablet, Take 81 mg by mouth daily., Disp: , Rfl:  .  BD INSULIN SYRINGE ULTRAFINE 31G X 15/64" 0.3 ML MISC, USE AS DIRECTED, Disp: 100 each, Rfl: 3 .  blood glucose meter kit and supplies KIT, OneTouch Verio w/ Device Kit OneTouch Verio VI STRP OneTouch Delica Lancet Dev misc OneTouch Delica Lancets 06Y or Fine  Please check CBGs fasting and 2 other times during the day  Dx Code E11.8, Disp: 1 each, Rfl: 0 .  carvedilol (COREG) 12.5 MG tablet, TAKE 1 TABLET BY MOUTH TWICE DAILY, Disp: 180 tablet, Rfl: 4 .  finasteride (PROSCAR) 5 MG tablet, Take 1 tablet (5 mg total) by mouth daily. (Patient taking differently: Take 5 mg by mouth at bedtime. ), Disp: 90 tablet, Rfl: 1 .  furosemide (LASIX) 20 MG tablet, Take 1 tablet (20 mg) by mouth twice daily 6 days a week, Disp: , Rfl:  .  gabapentin (NEURONTIN) 300 MG capsule, Take 1 capsule (300 mg total) by mouth at bedtime. (Patient not taking: Reported on 03/19/2018), Disp: 30 capsule, Rfl: 11 .  Insulin Pen Needle (BD PEN NEEDLE NANO U/F) 32G X 4 MM MISC, USE AS DIRECTED, Disp: 100 each, Rfl: 11 .  omeprazole (PRILOSEC) 20 MG capsule, TAKE 1 CAPSULE BY MOUTH TWICE DAILY, Disp: 60 capsule, Rfl: 2 .  potassium chloride (K-DUR) 10 MEQ tablet,  Take 1 tablet (10 meq) by mouth twice daily 6 days a week, Disp: , Rfl:  .  ramipril (ALTACE) 10 MG capsule, Take 1 capsule (10 mg total) by mouth 2 (two) times daily., Disp: 180 capsule, Rfl: 4 .  rosuvastatin (CRESTOR) 20 MG tablet, Take 1 tablet (20 mg total) by mouth daily., Disp: 90 tablet, Rfl: 4 .  TOUJEO SOLOSTAR 300 UNIT/ML SOPN, INJECT 50-60 UNITS INTO THE SKIN DAILY .INCREASE BY 1 UNITS/DAY UNTILFBS ARE BETWEEN 80-130. DO NOT EXCEED 60 UNITS PER DAY. (Patient taking differently: Inject 46-48 Units into the skin daily. ), Disp: 4.5 mL, Rfl: 6 .  traMADol (ULTRAM) 50 MG tablet, Take 1 tablet (50 mg total) by mouth 2 (two) times daily as needed. (Patient taking differently: Take 50 mg by mouth 2 (two) times daily as needed (for pain.). ), Disp: 10 tablet, Rfl: 0 .  traZODone (DESYREL) 50 MG tablet, TAKE ONE TO TWO TABLETS BY MOUTH AT BEDTIME AS NEEDED FOR SLEEP, Disp: 90 tablet, Rfl: 3 .  vitamin B-12 (CYANOCOBALAMIN) 50 MCG tablet, Take 50 mcg by mouth daily., Disp: , Rfl:  .  Vitamin D, Cholecalciferol, 1000 units TABS, Take 1,000 Units by mouth daily. , Disp: , Rfl:   Past Medical History: Past Medical History:  Diagnosis Date  . BPH (benign prostatic hyperplasia)   .  Cancer ([place])    skin  . Cataract   . Coronary artery disease    a. 4v CABG 01/2005; b. cath 06/05/13 showed patent grafts: LIMA-LAD, VG-D, VG-OM3, VG-dRCA, nl filling pressures, nl LV fxn  . Diastolic dysfunction    a. echo 08/7617: nl systolic fxn, mild LVH, diastolic relaxation abnormality, mildly enlarged LA, mild Ao insufficiency  . Dyspnea    with exertion  . ED (erectile dysfunction)   . GERD (gastroesophageal reflux disease)   . Hyperlipidemia   . Hypertension   . 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 Osteoarthritis   . Perirectal fistula   . Pneumonia 12/2016  . Tubular adenoma of colon 07/2012  . Vertigo     Tobacco Use: Social History   Tobacco Use  Smoking Status Never Smoker   Smokeless Tobacco Never Used    Labs: Recent Review Flowsheet Data    Labs for ITP Cardiac and Pulmonary Rehab Latest Ref Rng & Units 06/02/2016 08/02/2016 11/09/2016 08/18/2017 12/27/2017   Cholestrol 100 - 199 mg/dL 121 - - 99 125   LDLCALC 0 - 99 mg/dL - - - 41 -   LDLDIRECT mg/dL 65.0 - - - -   HDL >39.00 mg/dL 32.50(L) - - 31.00(L) -   Trlycerides 0 - 149 mg/dL 260.0(H) - - 137.0 289(H)   Hemoglobin A1c 4.6 - 6.5 % - 8.4 8.3 7.7(H) 6.9(H)   PHART 7.350 - 7.450 - - - - -   PCO2ART 35.0 - 45.0 mmHg - - - - -   HCO3 20.0 - 24.0 mEq/L - - - - -   TCO2 0 - 100 mmol/L - - - - -   O2SAT % - - - - -       Exercise Target Goals: Exercise Program Goal: Individual exercise prescription set using results from initial 6 min walk test and THRR while considering  patient's activity barriers and safety.   Exercise Prescription Goal: Initial exercise prescription builds to 30-45 minutes a day of aerobic activity, 2-3 days per week.  Home exercise guidelines will be given to patient during program as part of exercise prescription that the participant will acknowledge.  Activity Barriers & Risk Stratification: Activity Barriers & Cardiac Risk Stratification - 03/19/18 1246      Activity Barriers & Cardiac Risk Stratification   Activity Barriers  Back Problems;Deconditioning;Muscular Weakness;Shortness of Breath;Balance Concerns   Occasional vertigo   Cardiac Risk Stratification  High       6 Minute Walk: 6 Minute Walk    Row Name 03/19/18 1245         6 Minute Walk   Phase  Initial     Distance  965 feet     Walk Time  6 minutes     # of Rest Breaks  0     MPH  1.83     METS  1.5     RPE  15     Perceived Dyspnea   1     VO2 Peak  5.25     Symptoms  Yes (comment)     Comments  hip pain 7-8/10, SOB     Resting HR  58 bpm     Resting BP  124/56     Resting Oxygen Saturation   97 %     Exercise Oxygen Saturation  during 6 min walk  97 %     Max Ex. HR  84 bpm     Max Ex. BP  144/74     2 Minute Post BP  120/64        Oxygen Initial Assessment:   Oxygen Re-Evaluation:   Oxygen Discharge (Final Oxygen Re-Evaluation):   Initial Exercise Prescription: Initial Exercise Prescription - 03/19/18 1200      Date of Initial Exercise RX and Referring Provider   Date  03/19/18    Referring Provider  Ida Rogue MD      Treadmill   MPH  1.5    Grade  0.5    Minutes  15    METs  2.25      NuStep   Level  2    SPM  80    Minutes  15    METs  2      REL-XR   Level  1    Speed  50    Minutes  15    METs  2      Prescription Details   Frequency (times per week)  3    Duration  Progress to 45 minutes of aerobic exercise without signs/symptoms of physical distress      Intensity   THRR 40-80% of Max Heartrate  90-122    Ratings of Perceived Exertion  11-13    Perceived Dyspnea  0-4      Progression   Progression  Continue to progress workloads to maintain intensity without signs/symptoms of physical distress.      Resistance Training   Training Prescription  Yes    Weight  3 lbs    Reps  10-15       Perform Capillary Blood Glucose checks as needed.  Exercise Prescription Changes: Exercise Prescription Changes    Row Name 03/19/18 1200 03/29/18 0800 04/26/18 0800         Response to Exercise   Blood Pressure (Admit)  124/56  134/64  130/66     Blood Pressure (Exercise)  144/74  142/64  148/66     Blood Pressure (Exit)  120/64  132/60  122/60     Heart Rate (Admit)  58 bpm  70 bpm  70 bpm     Heart Rate (Exercise)  84 bpm  75 bpm  87 bpm     Heart Rate (Exit)  62 bpm  68 bpm  61 bpm     Oxygen Saturation (Admit)  97 %  -  -     Oxygen Saturation (Exercise)  97 %  -  -     Rating of Perceived Exertion (Exercise)  _0 Perceived Dyspnea (Exercise)  1  -  -     Symptoms  hip pain 7-8/10; SOB  -  -     Comments  walk test results  second day  first day back since 1/30     Duration  -  Progress to 45 minutes of aerobic  exercise without signs/symptoms of physical distress  Progress to 45 minutes of aerobic exercise without signs/symptoms of physical distress     Intensity  -  THRR unchanged  THRR unchanged       Progression   Progression  -  Continue to progress workloads to maintain intensity without signs/symptoms of physical distress.  Continue to progress workloads to maintain intensity without signs/symptoms of physical distress.     Average METs  -  2.5  3.13       Resistance Training   Training Prescription  -  Yes  Yes  Weight  -  3 lb  3 lb     Reps  -  10-15  10-15       Interval Training   Interval Training  -  No  No       Treadmill   MPH  -  -  1.5     Grade  -  -  0.5     Minutes  -  -  15     METs  -  -  2.25       REL-XR   Level  -  1  1     Speed  -  50  50     Minutes  -  15  15     METs  -  2.6  4.4        Exercise Comments: Exercise Comments    Row Name 03/26/18 1729           Exercise Comments  First full day of exercise!  Patient was oriented to gym and equipment including functions, settings, policies, and procedures.  Patient's individual exercise prescription and treatment plan were reviewed.  All starting workloads were established based on the results of the 6 minute walk test done at initial orientation visit.  The plan for exercise progression was also introduced and progression will be customized based on patient's performance and goals.          Exercise Goals and Review: Exercise Goals    Row Name 03/19/18 1248             Exercise Goals   Increase Physical Activity  Yes       Intervention  Provide advice, education, support and counseling about physical activity/exercise needs.;Develop an individualized exercise prescription for aerobic and resistive training based on initial evaluation findings, risk stratification, comorbidities and participant's personal goals.       Expected Outcomes  Short Term: Attend rehab on a regular basis to increase  amount of physical activity.;Long Term: Add in home exercise to make exercise part of routine and to increase amount of physical activity.;Long Term: Exercising regularly at least 3-5 days a week.       Increase Strength and Stamina  Yes       Intervention  Provide advice, education, support and counseling about physical activity/exercise needs.;Develop an individualized exercise prescription for aerobic and resistive training based on initial evaluation findings, risk stratification, comorbidities and participant's personal goals.       Expected Outcomes  Short Term: Increase workloads from initial exercise prescription for resistance, speed, and METs.;Short Term: Perform resistance training exercises routinely during rehab and add in resistance training at home;Long Term: Improve cardiorespiratory fitness, muscular endurance and strength as measured by increased METs and functional capacity (6MWT)       Able to understand and use rate of perceived exertion (RPE) scale  Yes       Intervention  Provide education and explanation on how to use RPE scale       Expected Outcomes  Short Term: Able to use RPE daily in rehab to express subjective intensity level;Long Term:  Able to use RPE to guide intensity level when exercising independently       Able to understand and use Dyspnea scale  Yes       Intervention  Provide education and explanation on how to use Dyspnea scale       Expected Outcomes  Short Term: Able to use Dyspnea scale daily in rehab  to express subjective sense of shortness of breath during exertion;Long Term: Able to use Dyspnea scale to guide intensity level when exercising independently       Knowledge and understanding of Target Heart Rate Range (THRR)  Yes       Intervention  Provide education and explanation of THRR including how the numbers were predicted and where they are located for reference       Expected Outcomes  Short Term: Able to state/look up THRR;Short Term: Able to use  daily as guideline for intensity in rehab;Long Term: Able to use THRR to govern intensity when exercising independently       Able to check pulse independently  Yes       Intervention  Provide education and demonstration on how to check pulse in carotid and radial arteries.;Review the importance of being able to check your own pulse for safety during independent exercise       Expected Outcomes  Short Term: Able to explain why pulse checking is important during independent exercise;Long Term: Able to check pulse independently and accurately       Understanding of Exercise Prescription  Yes       Intervention  Provide education, explanation, and written materials on patient's individual exercise prescription       Expected Outcomes  Short Term: Able to explain program exercise prescription;Long Term: Able to explain home exercise prescription to exercise independently          Exercise Goals Re-Evaluation : Exercise Goals Re-Evaluation    Row Name 03/26/18 1729 04/26/18 0816           Exercise Goal Re-Evaluation   Exercise Goals Review  Increase Physical Activity;Able to understand and use rate of perceived exertion (RPE) scale;Knowledge and understanding of Target Heart Rate Range (THRR);Understanding of Exercise Prescription;Increase Strength and Stamina;Able to understand and use Dyspnea scale  Increase Physical Activity;Increase Strength and Stamina;Able to understand and use rate of perceived exertion (RPE) scale;Knowledge and understanding of Target Heart Rate Range (THRR);Understanding of Exercise Prescription      Comments  Reviewed RPE scale, THR and program prescription with pt today.  Pt voiced understanding and was given a copy of goals to take home.   Today is [name]'s first day back this month due to illness and vertigo.  Staff will monitor progress.      Expected Outcomes  Short: Use RPE daily to regulate intensity. Long: Follow program prescription in THR.  Short - attend class  consistently Long - Increase MET level         Discharge Exercise Prescription (Final Exercise Prescription Changes): Exercise Prescription Changes - 04/26/18 0800      Response to Exercise   Blood Pressure (Admit)  130/66    Blood Pressure (Exercise)  148/66    Blood Pressure (Exit)  122/60    Heart Rate (Admit)  70 bpm    Heart Rate (Exercise)  87 bpm    Heart Rate (Exit)  61 bpm    Rating of Perceived Exertion (Exercise)  11    Comments  first day back since 1/30    Duration  Progress to 45 minutes of aerobic exercise without signs/symptoms of physical distress    Intensity  THRR unchanged      Progression   Progression  Continue to progress workloads to maintain intensity without signs/symptoms of physical distress.    Average METs  3.13      Resistance Training   Training Prescription  Yes  Weight  3 lb    Reps  10-15      Interval Training   Interval Training  No      Treadmill   MPH  1.5    Grade  0.5    Minutes  15    METs  2.25      REL-XR   Level  1    Speed  50    Minutes  15    METs  4.4       Nutrition:  Target Goals: Understanding of nutrition guidelines, daily intake of sodium <1512m, cholesterol <2060m calories 30% from fat and 7% or less from saturated fats, daily to have 5 or more servings of fruits and vegetables.  Biometrics: Pre Biometrics - 03/19/18 1249      Pre Biometrics   Height  5' 11.9" (1.826 m)    Weight  220 lb (99.8 kg)    Waist Circumference  42.5 inches    Hip Circumference  43.5 inches    Waist to Hip Ratio  0.98 %    BMI (Calculated)  29.93    Single Leg Stand  6.17 seconds        Nutrition Therapy Plan and Nutrition Goals: Nutrition Therapy & Goals - 03/19/18 1319      Intervention Plan   Intervention  Prescribe, educate and counsel regarding individualized specific dietary modifications aiming towards targeted core components such as weight, hypertension, lipid management, diabetes, heart failure and other  comorbidities.    Expected Outcomes  Short Term Goal: Understand basic principles of dietary content, such as calories, fat, sodium, cholesterol and nutrients.;Short Term Goal: A plan has been developed with personal nutrition goals set during dietitian appointment.;Long Term Goal: Adherence to prescribed nutrition plan.       Nutrition Assessments: Nutrition Assessments - 03/19/18 1333      Rate Your Plate Scores   Pre Score  31       Nutrition Goals Re-Evaluation:   Nutrition Goals Discharge (Final Nutrition Goals Re-Evaluation):   Psychosocial: Target Goals: Acknowledge presence or absence of significant depression and/or stress, maximize coping skills, provide positive support system. Participant is able to verbalize types and ability to use techniques and skills needed for reducing stress and depression.   Initial Review & Psychosocial Screening: Initial Psych Review & Screening - 03/19/18 1318      Initial Review   Current issues with  Current Sleep Concerns   Not able to fall asleep.  Best sleep is between 6 to 9 AM     [place] Yes   Spouse     Barriers   Psychosocial barriers to participate in program  There are no identifiable barriers or psychosocial needs.;The patient should benefit from training in stress management and relaxation.      Screening Interventions   Interventions  Encouraged to exercise;To provide support and resources with identified psychosocial needs;Provide feedback about the scores to participant    Expected Outcomes  Short Term goal: Utilizing psychosocial counselor, staff and physician to assist with identification of specific Stressors or current issues interfering with healing process. Setting desired goal for each stressor or current issue identified.;Long Term Goal: Stressors or current issues are controlled or eliminated.;Short Term goal: Identification and review with participant of any Quality of Life or  Depression concerns found by scoring the questionnaire.;Long Term goal: The participant improves quality of Life and PHQ9 Scores as seen by post scores and/or verbalization of changes  Quality of Life Scores:  Quality of Life - 03/19/18 1318      Quality of Life   Select  Quality of Life      Quality of Life Scores   Health/Function Pre  19.6 %    Socioeconomic Pre  30 %    Psych/Spiritual Pre  30 %    Family Pre  27.6 %    GLOBAL Pre  25.2 %      Scores of 19 and below usually indicate a poorer quality of life in these areas.  A difference of  2-3 points is a clinically meaningful difference.  A difference of 2-3 points in the total score of the Quality of Life Index has been associated with significant improvement in overall quality of life, self-image, physical symptoms, and general health in studies assessing change in quality of life.  PHQ-9: Recent Review Flowsheet Data    Depression screen [facility] 2/9 03/19/2018 03/05/2018 12/26/2017 05/06/2016 10/05/2015   Decreased Interest 0 0 0 0 0   Down, Depressed, Hopeless 0 0 0 0 0   PHQ - 2 Score 0 0 0 0 0   Altered sleeping 1 - - 2 1   Tired, decreased energy 1 - - 2 2   Change in appetite 1 - - 1 2   Feeling bad or failure about yourself  0 - - 0 0   Trouble concentrating 0 - - 0 0   Moving slowly or fidgety/restless 0 - - 0 0   Suicidal thoughts 0 - - 0 0   PHQ-9 Score 3 - - 5 5   Difficult doing work/chores - - - Not difficult at all Not difficult at all     Interpretation of Total Score  Total Score Depression Severity:  1-4 = Minimal depression, 5-9 = Mild depression, 10-14 = Moderate depression, 15-19 = Moderately severe depression, 20-27 = Severe depression   Psychosocial Evaluation and Intervention: Psychosocial Evaluation - 03/28/18 1715      Psychosocial Evaluation & Interventions   Interventions  Encouraged to exercise with the program and follow exercise prescription    Comments  Counselor met with Mr. Walkup  [facility]) today as he has returned to this program after more than a year of being out.  This initial psychosocial evaluation indicates an 85 year old who has a history of heart disease - including CABGx5 13 years ago.  He has a strong support system with a spouse of 10 years; (4) adult children and active involvement in his local church.  [name] also struggles with type 2 diabetes.  He reports not sleeping well but gets about 6-8 hours of interrupted sleep and his appetite is not very good - but he is not losing weight at this time.  He denies a history of depression or anxiety or any current symptoms and is typically in a positive mood most of the time.  He has goals to breathe better; increase his stamina and maybe lose some weight this time while in this program.  Staff will follow with him.     Expected Outcomes  Short:  [name] will exercise to increase his ability to breathe and improve his stamina.  He will meet with the dietician to address his weight loss goals.  Long:  [name] will exercise consistently for his health and for weight loss.      Continue Psychosocial Services   Follow up required by staff       Psychosocial Re-Evaluation:   Psychosocial  Discharge (Final Psychosocial Re-Evaluation):   Vocational Rehabilitation: Provide vocational rehab assistance to qualifying candidates.   Vocational Rehab Evaluation & Intervention: Vocational Rehab - 03/19/18 1320      Initial Vocational Rehab Evaluation & Intervention   Assessment shows need for Vocational Rehabilitation  No       Education: Education Goals: Education classes will be provided on a variety of topics geared toward better understanding of heart health and risk factor modification. Participant will state understanding/return demonstration of topics presented as noted by education test scores.  Learning Barriers/Preferences: Learning Barriers/Preferences - 03/19/18 1320      Learning Barriers/Preferences   Learning Barriers   None    Learning Preferences  None       Education Topics:  AED/CPR: - Group verbal and written instruction with the use of models to demonstrate the basic use of the AED with the basic ABC's of resuscitation.   Cardiac Rehab from 04/30/2018 in [facility] Cardiac and Pulmonary Rehab  Date  04/30/18  Educator  CE  Instruction Review Code  1- Verbalizes Understanding      General Nutrition Guidelines/Fats and Fiber: -Group instruction provided by verbal, written material, models and posters to present the general guidelines for heart healthy nutrition. Gives an explanation and review of dietary fats and fiber.   Controlling Sodium/Reading Food Labels: -Group verbal and written material supporting the discussion of sodium use in heart healthy nutrition. Review and explanation with models, verbal and written materials for utilization of the food label.   Exercise Physiology & General Exercise Guidelines: - Group verbal and written instruction with models to review the exercise physiology of the cardiovascular system and associated critical values. Provides general exercise guidelines with specific guidelines to those with heart or lung disease.    Cardiac Rehab from 04/30/2018 in [facility] Cardiac and Pulmonary Rehab  Date  03/26/18  Educator  [facility]  Instruction Review Code  1- Verbalizes Understanding      Aerobic Exercise & Resistance Training: - Gives group verbal and written instruction on the various components of exercise. Focuses on aerobic and resistive training programs and the benefits of this training and how to safely progress through these programs..   Cardiac Rehab from 04/30/2018 in [facility] Cardiac and Pulmonary Rehab  Date  03/28/18  Educator  AS  Instruction Review Code  1- Verbalizes Understanding      Flexibility, Balance, Mind/Body Relaxation: Provides group verbal/written instruction on the benefits of flexibility and balance training, including mind/body exercise modes such  as yoga, pilates and tai chi.  Demonstration and skill practice provided.   Cardiac Rehab from 04/30/2018 in [facility] Cardiac and Pulmonary Rehab  Date  04/02/18  Educator  AS  Instruction Review Code  1- Verbalizes Understanding      Stress and Anxiety: - Provides group verbal and written instruction about the health risks of elevated stress and causes of high stress.  Discuss the correlation between heart/lung disease and anxiety and treatment options. Review healthy ways to manage with stress and anxiety.   Cardiac Rehab from 04/30/2018 in [facility] Cardiac and Pulmonary Rehab  Date  04/25/18  Educator  [facility]  Instruction Review Code  1- Verbalizes Understanding      Depression: - Provides group verbal and written instruction on the correlation between heart/lung disease and depressed mood, treatment options, and the stigmas associated with seeking treatment.   Anatomy & Physiology of the Heart: - Group verbal and written instruction and models provide basic cardiac anatomy and physiology, with  the coronary electrical and arterial systems. Review of Valvular disease and Heart Failure   Cardiac Rehab from 04/18/2016 in [facility] Cardiac and Pulmonary Rehab  Date  02/08/16  Educator  SB  Instruction Review Code (retired)  2- meets goals/outcomes      Cardiac Procedures: - Group verbal and written instruction to review commonly prescribed medications for heart disease. Reviews the medication, class of the drug, and side effects. Includes the steps to properly store meds and maintain the prescription regimen. (beta blockers and nitrates)   Cardiac Rehab from 04/18/2016 in [facility] Cardiac and Pulmonary Rehab  Date  04/18/16  Educator  CE  Instruction Review Code (retired)  2- meets goals/outcomes      Cardiac Medications I: - Group verbal and written instruction to review commonly prescribed medications for heart disease. Reviews the medication, class of the drug, and side effects. Includes the steps  to properly store meds and maintain the prescription regimen.   Cardiac Medications II: -Group verbal and written instruction to review commonly prescribed medications for heart disease. Reviews the medication, class of the drug, and side effects. (all other drug classes)   Cardiac Rehab from 04/30/2018 in [facility] Cardiac and Pulmonary Rehab  Date  04/04/18  Educator  [facility]  Instruction Review Code  1- Verbalizes Understanding       Go Sex-Intimacy & Heart Disease, Get SMART - Goal Setting: - Group verbal and written instruction through game format to discuss heart disease and the return to sexual intimacy. Provides group verbal and written material to discuss and apply goal setting through the application of the S.M.A.R.T. Method.   Cardiac Rehab from 04/18/2016 in [facility] Cardiac and Pulmonary Rehab  Date  04/18/16  Educator  CE  Instruction Review Code (retired)  2- meets goals/outcomes      Other Matters of the Heart: - Provides group verbal, written materials and models to describe Stable Angina and Peripheral Artery. Includes description of the disease process and treatment options available to the cardiac patient.   Cardiac Rehab from 04/18/2016 in [facility] Cardiac and Pulmonary Rehab  Date  04/11/16  Educator  C. Enterkin, RN  Instruction Review Code (retired)  2- meets goals/outcomes      Exercise & Equipment Safety: - Individual verbal instruction and demonstration of equipment use and safety with use of the equipment.   Cardiac Rehab from 04/30/2018 in [facility] Cardiac and Pulmonary Rehab  Date  03/19/18  Educator  SB  Instruction Review Code  1- Verbalizes Understanding      Infection Prevention: - Provides verbal and written material to individual with discussion of infection control including proper hand washing and proper equipment cleaning during exercise session.   Cardiac Rehab from 04/30/2018 in [facility] Cardiac and Pulmonary Rehab  Date  03/19/18  Educator  SB  Instruction  Review Code  1- Verbalizes Understanding      Falls Prevention: - Provides verbal and written material to individual with discussion of falls prevention and safety.   Cardiac Rehab from 04/30/2018 in [facility] Cardiac and Pulmonary Rehab  Date  03/19/18  Educator  SB  Instruction Review Code  1- Verbalizes Understanding      Diabetes: - Individual verbal and written instruction to review signs/symptoms of diabetes, desired ranges of glucose level fasting, after meals and with exercise. Acknowledge that pre and post exercise glucose checks will be done for 3 sessions at entry of program.   Cardiac Rehab from 04/30/2018 in [facility] Cardiac and Pulmonary Rehab  Date  03/19/18  Educator  SB  Instruction Review Code  1- Verbalizes Understanding      Know Your Numbers and Risk Factors: -Group verbal and written instruction about important numbers in your health.  Discussion of what are risk factors and how they play a role in the disease process.  Review of Cholesterol, Blood Pressure, Diabetes, and BMI and the role they play in your overall health.   Cardiac Rehab from 04/30/2018 in [facility] Cardiac and Pulmonary Rehab  Date  04/04/18  Educator  [facility]  Instruction Review Code  1- Verbalizes Understanding      Sleep Hygiene: -Provides group verbal and written instruction about how sleep can affect your health.  Define sleep hygiene, discuss sleep cycles and impact of sleep habits. Review good sleep hygiene tips.    Other: -Provides group and verbal instruction on various topics (see comments)   Knowledge Questionnaire Score: Knowledge Questionnaire Score - 03/19/18 1321      Knowledge Questionnaire Score   Pre Score  22/26   Reviewed correct response with [name] today. He verbalized understanding and had no further questions today      Core Components/Risk Factors/Patient Goals at Admission: Personal Goals and Risk Factors at Admission - 03/19/18 1322      Core Components/Risk  Factors/Patient Goals on Admission    Weight Management  Yes;Obesity    Intervention  Weight Management: Develop a combined nutrition and exercise program designed to reach desired caloric intake, while maintaining appropriate intake of nutrient and fiber, sodium and fats, and appropriate energy expenditure required for the weight goal.;Weight Management/Obesity: Establish reasonable short term and long term weight goals.    Admit Weight  220 lb (99.8 kg)    Goal Weight: Short Term  218 lb (98.9 kg)    Goal Weight: Long Term  200 lb (90.7 kg)    Expected Outcomes  Short Term: Continue to assess and modify interventions until short term weight is achieved;Long Term: Adherence to nutrition and physical activity/exercise program aimed toward attainment of established weight goal;Weight Loss: Understanding of general recommendations for a balanced deficit meal plan, which promotes 1-2 lb weight loss per week and includes a negative energy balance of [phone] kcal/d    Diabetes  Yes    Intervention  Provide education about signs/symptoms and action to take for hypo/hyperglycemia.;Provide education about proper nutrition, including hydration, and aerobic/resistive exercise prescription along with prescribed medications to achieve blood glucose in normal ranges: Fasting glucose 65-99 mg/dL    Expected Outcomes  Short Term: Participant verbalizes understanding of the signs/symptoms and immediate care of hyper/hypoglycemia, proper foot care and importance of medication, aerobic/resistive exercise and nutrition plan for blood glucose control.;Long Term: Attainment of HbA1C < 7%.    Heart Failure  Yes    Intervention  Provide a combined exercise and nutrition program that is supplemented with education, support and counseling about heart failure. Directed toward relieving symptoms such as shortness of breath, decreased exercise tolerance, and extremity edema.    Expected Outcomes  Improve functional capacity of  life;Short term: Attendance in program 2-3 days a week with increased exercise capacity. Reported lower sodium intake. Reported increased fruit and vegetable intake. Reports medication compliance.;Short term: Daily weights obtained and reported for increase. Utilizing diuretic protocols set by physician.;Long term: Adoption of self-care skills and reduction of barriers for early signs and symptoms recognition and intervention leading to self-care maintenance.    Hypertension  Yes    Intervention  Provide education on lifestyle modifcations including regular  physical activity/exercise, weight management, moderate sodium restriction and increased consumption of fresh fruit, vegetables, and low fat dairy, alcohol moderation, and smoking cessation.;Monitor prescription use compliance.    Expected Outcomes  Short Term: Continued assessment and intervention until BP is < 140/1m HG in hypertensive participants. < 130/852mHG in hypertensive participants with diabetes, heart failure or chronic kidney disease.;Long Term: Maintenance of blood pressure at goal levels.    Lipids  Yes    Intervention  Provide education and support for participant on nutrition & aerobic/resistive exercise along with prescribed medications to achieve LDL <7082mHDL >6m1m  Expected Outcomes  Short Term: Participant states understanding of desired cholesterol values and is compliant with medications prescribed. Participant is following exercise prescription and nutrition guidelines.;Long Term: Cholesterol controlled with medications as prescribed, with individualized exercise RX and with personalized nutrition plan. Value goals: LDL < 70mg17mL > 40 mg.       Core Components/Risk Factors/Patient Goals Review:    Core Components/Risk Factors/Patient Goals at Discharge (Final Review):    ITP Comments: ITP Comments    Row Name 03/19/18 1304 04/04/18 1411 04/25/18 1819 05/02/18 0627     ITP Comments  Med review completed today.  Initial ITP sent to Dr M MilLoleta Chancereview,changes as needed and signature. Diagnosis documentation can be found in [facility]  30 day review completed. ITP sent to Dr. Mark Emily Filbertical Director of Cardiac Rehab. Continue with ITP unless changes are made by physician.  Today is Mels first day back afte rbeing out due to illness and vertigo.  30 day review. Continue with ITP unless directed changes by Medical Director chart review.       Comments:

## 2018-05-03 DIAGNOSIS — I208 Other forms of angina pectoris: Secondary | ICD-10-CM

## 2018-05-03 DIAGNOSIS — I110 Hypertensive heart disease with heart failure: Secondary | ICD-10-CM | POA: Diagnosis not present

## 2018-05-03 DIAGNOSIS — I5032 Chronic diastolic (congestive) heart failure: Secondary | ICD-10-CM | POA: Diagnosis not present

## 2018-05-03 NOTE — Progress Notes (Signed)
Daily Session Note  Patient Details  Name: [name] [name] MRN: [id number] Date of Birth: 1934/01/23 Referring Provider:     Cardiac Rehab from 03/19/2018 in [facility] Cardiac and Pulmonary Rehab  Referring Provider  Ida Rogue MD      Encounter Date: 05/03/2018  Check In: Session Check In - 05/03/18 1657      Check-In   Supervising physician immediately available to respond to emergencies  See telemetry face sheet for immediately available ER MD    Location  [facility]-Cardiac & Pulmonary Rehab    Staff Present  Alberteen Sam, MA, RCEP, CCRP, Exercise Physiologist;Kelly Amedeo Plenty, BS, ACSM CEP, Exercise Physiologist;Meredith Sherryll Burger, RN BSN    Medication changes reported      No    Fall or balance concerns reported     No    Warm-up and Cool-down  Performed as group-led Higher education careers adviser Performed  Yes    VAD Patient?  No    PAD/SET Patient?  No      Pain Assessment   Currently in Pain?  No/denies          Social History   Tobacco Use  Smoking Status Never Smoker  Smokeless Tobacco Never Used    Goals Met:  Independence with exercise equipment Exercise tolerated well No report of cardiac concerns or symptoms Strength training completed today  Goals Unmet:  Not Applicable  Comments: Pt able to follow exercise prescription today without complaint.  Will continue to monitor for progression.    Dr. Emily Filbert is Medical Director for [place] and [facility].

## 2018-05-07 ENCOUNTER — Encounter: Payer: Medicare Other | Attending: Cardiovascular Disease

## 2018-05-07 DIAGNOSIS — I5032 Chronic diastolic (congestive) heart failure: Secondary | ICD-10-CM | POA: Insufficient documentation

## 2018-05-07 DIAGNOSIS — I110 Hypertensive heart disease with heart failure: Secondary | ICD-10-CM | POA: Insufficient documentation

## 2018-05-07 DIAGNOSIS — E118 Type 2 diabetes mellitus with unspecified complications: Secondary | ICD-10-CM | POA: Diagnosis not present

## 2018-05-07 DIAGNOSIS — I208 Other forms of angina pectoris: Secondary | ICD-10-CM | POA: Insufficient documentation

## 2018-05-07 DIAGNOSIS — Z794 Long term (current) use of insulin: Secondary | ICD-10-CM | POA: Diagnosis not present

## 2018-05-09 DIAGNOSIS — I208 Other forms of angina pectoris: Secondary | ICD-10-CM | POA: Diagnosis not present

## 2018-05-09 DIAGNOSIS — I5032 Chronic diastolic (congestive) heart failure: Secondary | ICD-10-CM | POA: Diagnosis not present

## 2018-05-09 DIAGNOSIS — I110 Hypertensive heart disease with heart failure: Secondary | ICD-10-CM | POA: Diagnosis not present

## 2018-05-09 NOTE — Progress Notes (Signed)
Daily Session Note  Patient Details  Name: [name] [name] MRN: [id number] Date of Birth: 10-06-1933 Referring Provider:     Cardiac Rehab from 03/19/2018 in [facility] Cardiac and Pulmonary Rehab  Referring Provider  Ida Rogue MD      Encounter Date: 05/09/2018  Check In: Session Check In - 05/09/18 1626      Check-In   Supervising physician immediately available to respond to emergencies  See telemetry face sheet for immediately available ER MD    Location  [facility]-Cardiac & Pulmonary Rehab    Staff Present  Renita Papa, RN BSN;Jeanna Durrell BS, Exercise Physiologist;Stacey Maura Oletta Darter, BA, ACSM CEP, Exercise Physiologist    Medication changes reported      No    Fall or balance concerns reported     No    Warm-up and Cool-down  Performed as group-led instruction    Resistance Training Performed  Yes    VAD Patient?  No    PAD/SET Patient?  No      Pain Assessment   Currently in Pain?  No/denies    Multiple Pain Sites  No          Social History   Tobacco Use  Smoking Status Never Smoker  Smokeless Tobacco Never Used    Goals Met:  Independence with exercise equipment Exercise tolerated well No report of cardiac concerns or symptoms Strength training completed today  Goals Unmet:  Not Applicable  Comments: Pt able to follow exercise prescription today without complaint.  Will continue to monitor for progression.    Dr. Emily Filbert is Medical Director for [place] and [facility].

## 2018-05-10 ENCOUNTER — Encounter: Payer: Medicare Other | Admitting: *Deleted

## 2018-05-10 DIAGNOSIS — I5032 Chronic diastolic (congestive) heart failure: Secondary | ICD-10-CM | POA: Diagnosis not present

## 2018-05-10 DIAGNOSIS — I110 Hypertensive heart disease with heart failure: Secondary | ICD-10-CM | POA: Diagnosis not present

## 2018-05-10 DIAGNOSIS — I208 Other forms of angina pectoris: Secondary | ICD-10-CM | POA: Diagnosis not present

## 2018-05-10 NOTE — Progress Notes (Signed)
Daily Session Note  Patient Details  Name: [name] [name] MRN: [id number] Date of Birth: 09/13/1933 Referring Provider:     Cardiac Rehab from 03/19/2018 in [facility] Cardiac and Pulmonary Rehab  Referring Provider  Gollan, Timothy MD      Encounter Date: 05/10/2018  Check In: Session Check In - 05/10/18 1625      Check-In   Supervising physician immediately available to respond to emergencies  See telemetry face sheet for immediately available ER MD    Location  [facility]-Cardiac & Pulmonary Rehab    Staff Present  Meredith Craven, RN BSN;Carroll Enterkin, RN, BSN;Joseph Hood RCP,RRT,BSRT    Medication changes reported      No    Fall or balance concerns reported     No    Tobacco Cessation  No Change    Warm-up and Cool-down  Performed as group-led instruction    VAD Patient?  No    PAD/SET Patient?  No      Pain Assessment   Currently in Pain?  No/denies          Social History   Tobacco Use  Smoking Status Never Smoker  Smokeless Tobacco Never Used    Goals Met:  Independence with exercise equipment Exercise tolerated well No report of cardiac concerns or symptoms Strength training completed today  Goals Unmet:  Not Applicable  Comments: Pt able to follow exercise prescription today without complaint.  Will continue to monitor for progression.    Dr. Mark Miller is Medical Director for [facility] and [facility]. 

## 2018-05-25 DIAGNOSIS — I208 Other forms of angina pectoris: Secondary | ICD-10-CM

## 2018-05-30 ENCOUNTER — Encounter: Payer: Self-pay | Admitting: *Deleted

## 2018-05-30 DIAGNOSIS — I2089 Other forms of angina pectoris: Secondary | ICD-10-CM

## 2018-05-30 DIAGNOSIS — I208 Other forms of angina pectoris: Secondary | ICD-10-CM

## 2018-05-30 NOTE — Progress Notes (Signed)
Cardiac Individual Treatment Plan  Patient Details  Name: [name] [name] MRN: [id number] Date of Birth: [birth year] Referring Provider:     Cardiac Rehab from 03/19/2018 in [facility] Cardiac and Pulmonary Rehab  Referring Provider  Ida Rogue MD      Initial Encounter Date:    Cardiac Rehab from 03/19/2018 in [facility] Cardiac and Pulmonary Rehab  Date  03/19/18      Visit Diagnosis: Stable angina ([place])  Patient's Home Medications on Admission:  Current Outpatient Medications:  .  acetaminophen (TYLENOL) 500 MG tablet, Take 1,000 mg by mouth 2 (two) times daily., Disp: , Rfl:  .  amLODipine (NORVASC) 5 MG tablet, Take 1 tablet (5 mg total) by mouth 2 (two) times daily., Disp: 90 tablet, Rfl: 3 .  aspirin EC 81 MG tablet, Take 81 mg by mouth daily., Disp: , Rfl:  .  BD INSULIN SYRINGE ULTRAFINE 31G X 15/64" 0.3 ML MISC, USE AS DIRECTED, Disp: 100 each, Rfl: 3 .  blood glucose meter kit and supplies KIT, OneTouch Verio w/ Device Kit OneTouch Verio VI STRP OneTouch Delica Lancet Dev misc OneTouch Delica Lancets 06Y or Fine  Please check CBGs fasting and 2 other times during the day  Dx Code E11.8, Disp: 1 each, Rfl: 0 .  carvedilol (COREG) 12.5 MG tablet, TAKE 1 TABLET BY MOUTH TWICE DAILY, Disp: 180 tablet, Rfl: 4 .  finasteride (PROSCAR) 5 MG tablet, Take 1 tablet (5 mg total) by mouth daily. (Patient taking differently: Take 5 mg by mouth at bedtime. ), Disp: 90 tablet, Rfl: 1 .  furosemide (LASIX) 20 MG tablet, Take 1 tablet (20 mg) by mouth twice daily 6 days a week, Disp: , Rfl:  .  gabapentin (NEURONTIN) 300 MG capsule, Take 1 capsule (300 mg total) by mouth at bedtime. (Patient not taking: Reported on 03/19/2018), Disp: 30 capsule, Rfl: 11 .  Insulin Pen Needle (BD PEN NEEDLE NANO U/F) 32G X 4 MM MISC, USE AS DIRECTED, Disp: 100 each, Rfl: 11 .  omeprazole (PRILOSEC) 20 MG capsule, TAKE 1 CAPSULE BY MOUTH TWICE DAILY, Disp: 60 capsule, Rfl: 2 .  potassium chloride (K-DUR) 10 MEQ tablet,  Take 1 tablet (10 meq) by mouth twice daily 6 days a week, Disp: , Rfl:  .  ramipril (ALTACE) 10 MG capsule, Take 1 capsule (10 mg total) by mouth 2 (two) times daily., Disp: 180 capsule, Rfl: 4 .  rosuvastatin (CRESTOR) 20 MG tablet, Take 1 tablet (20 mg total) by mouth daily., Disp: 90 tablet, Rfl: 4 .  TOUJEO SOLOSTAR 300 UNIT/ML SOPN, INJECT 50-60 UNITS INTO THE SKIN DAILY .INCREASE BY 1 UNITS/DAY UNTILFBS ARE BETWEEN 80-130. DO NOT EXCEED 60 UNITS PER DAY. (Patient taking differently: Inject 46-48 Units into the skin daily. ), Disp: 4.5 mL, Rfl: 6 .  traMADol (ULTRAM) 50 MG tablet, Take 1 tablet (50 mg total) by mouth 2 (two) times daily as needed. (Patient taking differently: Take 50 mg by mouth 2 (two) times daily as needed (for pain.). ), Disp: 10 tablet, Rfl: 0 .  traZODone (DESYREL) 50 MG tablet, TAKE ONE TO TWO TABLETS BY MOUTH AT BEDTIME AS NEEDED FOR SLEEP, Disp: 90 tablet, Rfl: 3 .  vitamin B-12 (CYANOCOBALAMIN) 50 MCG tablet, Take 50 mcg by mouth daily., Disp: , Rfl:  .  Vitamin D, Cholecalciferol, 1000 units TABS, Take 1,000 Units by mouth daily. , Disp: , Rfl:   Past Medical History: Past Medical History:  Diagnosis Date  . BPH (benign prostatic hyperplasia)   .  Cancer ([place])    skin  . Cataract   . Coronary artery disease    a. 4v CABG 01/2005; b. cath 06/05/13 showed patent grafts: LIMA-LAD, VG-D, VG-OM3, VG-dRCA, nl filling pressures, nl LV fxn  . Diastolic dysfunction    a. echo 08/7617: nl systolic fxn, mild LVH, diastolic relaxation abnormality, mildly enlarged LA, mild Ao insufficiency  . Dyspnea    with exertion  . ED (erectile dysfunction)   . GERD (gastroesophageal reflux disease)   . Hyperlipidemia   . Hypertension   . 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 Osteoarthritis   . Perirectal fistula   . Pneumonia 12/2016  . Tubular adenoma of colon 07/2012  . Vertigo     Tobacco Use: Social History   Tobacco Use  Smoking Status Never Smoker   Smokeless Tobacco Never Used    Labs: Recent Review Flowsheet Data    Labs for ITP Cardiac and Pulmonary Rehab Latest Ref Rng & Units 06/02/2016 08/02/2016 11/09/2016 08/18/2017 12/27/2017   Cholestrol 100 - 199 mg/dL 121 - - 99 125   LDLCALC 0 - 99 mg/dL - - - 41 -   LDLDIRECT mg/dL 65.0 - - - -   HDL >39.00 mg/dL 32.50(L) - - 31.00(L) -   Trlycerides 0 - 149 mg/dL 260.0(H) - - 137.0 289(H)   Hemoglobin A1c 4.6 - 6.5 % - 8.4 8.3 7.7(H) 6.9(H)   PHART 7.350 - 7.450 - - - - -   PCO2ART 35.0 - 45.0 mmHg - - - - -   HCO3 20.0 - 24.0 mEq/L - - - - -   TCO2 0 - 100 mmol/L - - - - -   O2SAT % - - - - -       Exercise Target Goals: Exercise Program Goal: Individual exercise prescription set using results from initial 6 min walk test and THRR while considering  patient's activity barriers and safety.   Exercise Prescription Goal: Initial exercise prescription builds to 30-45 minutes a day of aerobic activity, 2-3 days per week.  Home exercise guidelines will be given to patient during program as part of exercise prescription that the participant will acknowledge.  Activity Barriers & Risk Stratification: Activity Barriers & Cardiac Risk Stratification - 03/19/18 1246      Activity Barriers & Cardiac Risk Stratification   Activity Barriers  Back Problems;Deconditioning;Muscular Weakness;Shortness of Breath;Balance Concerns   Occasional vertigo   Cardiac Risk Stratification  High       6 Minute Walk: 6 Minute Walk    Row Name 03/19/18 1245         6 Minute Walk   Phase  Initial     Distance  965 feet     Walk Time  6 minutes     # of Rest Breaks  0     MPH  1.83     METS  1.5     RPE  15     Perceived Dyspnea   1     VO2 Peak  5.25     Symptoms  Yes (comment)     Comments  hip pain 7-8/10, SOB     Resting HR  58 bpm     Resting BP  124/56     Resting Oxygen Saturation   97 %     Exercise Oxygen Saturation  during 6 min walk  97 %     Max Ex. HR  84 bpm     Max Ex. BP  144/74     2 Minute Post BP  120/64        Oxygen Initial Assessment:   Oxygen Re-Evaluation:   Oxygen Discharge (Final Oxygen Re-Evaluation):   Initial Exercise Prescription: Initial Exercise Prescription - 03/19/18 1200      Date of Initial Exercise RX and Referring Provider   Date  03/19/18    Referring Provider  Ida Rogue MD      Treadmill   MPH  1.5    Grade  0.5    Minutes  15    METs  2.25      NuStep   Level  2    SPM  80    Minutes  15    METs  2      REL-XR   Level  1    Speed  50    Minutes  15    METs  2      Prescription Details   Frequency (times per week)  3    Duration  Progress to 45 minutes of aerobic exercise without signs/symptoms of physical distress      Intensity   THRR 40-80% of Max Heartrate  90-122    Ratings of Perceived Exertion  11-13    Perceived Dyspnea  0-4      Progression   Progression  Continue to progress workloads to maintain intensity without signs/symptoms of physical distress.      Resistance Training   Training Prescription  Yes    Weight  3 lbs    Reps  10-15       Perform Capillary Blood Glucose checks as needed.  Exercise Prescription Changes: Exercise Prescription Changes    Row Name 03/19/18 1200 03/29/18 0800 04/26/18 0800 05/10/18 0900       Response to Exercise   Blood Pressure (Admit)  124/56  134/64  130/66  110/58    Blood Pressure (Exercise)  144/74  142/64  148/66  152/68    Blood Pressure (Exit)  120/64  132/60  122/60  132/60    Heart Rate (Admit)  58 bpm  70 bpm  70 bpm  63 bpm    Heart Rate (Exercise)  84 bpm  75 bpm  87 bpm  69 bpm    Heart Rate (Exit)  62 bpm  68 bpm  61 bpm  58 bpm    Oxygen Saturation (Admit)  97 %  -  -  -    Oxygen Saturation (Exercise)  97 %  -  -  -    Rating of Perceived Exertion (Exercise)  _0 Perceived Dyspnea (Exercise)  1  -  -  -    Symptoms  hip pain 7-8/10; SOB  -  -  -    Comments  walk test results  second day  first day back  since 1/30  -    Duration  -  Progress to 45 minutes of aerobic exercise without signs/symptoms of physical distress  Progress to 45 minutes of aerobic exercise without signs/symptoms of physical distress  Progress to 45 minutes of aerobic exercise without signs/symptoms of physical distress    Intensity  -  THRR unchanged  THRR unchanged  Other (comment) use RPE      Progression   Progression  -  Continue to progress workloads to maintain intensity without signs/symptoms of physical distress.  Continue to progress workloads to maintain intensity without signs/symptoms of physical distress.  Continue to progress workloads to maintain intensity without signs/symptoms of physical distress.    Average METs  -  2.5  3.13  4      Resistance Training   Training Prescription  -  Yes  Yes  Yes    Weight  -  3 lb  3 lb  3 lb    Reps  -  10-15  10-15  10-15      Interval Training   Interval Training  -  No  No  No      Treadmill   MPH  -  -  1.5  -    Grade  -  -  0.5  -    Minutes  -  -  15  -    METs  -  -  2.25  -      NuStep   Level  -  -  -  4    SPM  -  -  -  80    Minutes  -  -  -  15    METs  -  -  -  3.6      REL-XR   Level  -  _0 Speed  -  50  50  50    Minutes  -  _1 METs  -  2.6  4.4  4.4       Exercise Comments: Exercise Comments    Row Name 03/26/18 1729           Exercise Comments  First full day of exercise!  Patient was oriented to gym and equipment including functions, settings, policies, and procedures.  Patient's individual exercise prescription and treatment plan were reviewed.  All starting workloads were established based on the results of the 6 minute walk test done at initial orientation visit.  The plan for exercise progression was also introduced and progression will be customized based on patient's performance and goals.          Exercise Goals and Review: Exercise Goals    Row Name 03/19/18 1248             Exercise Goals    Increase Physical Activity  Yes       Intervention  Provide advice, education, support and counseling about physical activity/exercise needs.;Develop an individualized exercise prescription for aerobic and resistive training based on initial evaluation findings, risk stratification, comorbidities and participant's personal goals.       Expected Outcomes  Short Term: Attend rehab on a regular basis to increase amount of physical activity.;Long Term: Add in home exercise to make exercise part of routine and to increase amount of physical activity.;Long Term: Exercising regularly at least 3-5 days a week.       Increase Strength and Stamina  Yes       Intervention  Provide advice, education, support and counseling about physical activity/exercise needs.;Develop an individualized exercise prescription for aerobic and resistive training based on initial evaluation findings, risk stratification, comorbidities and participant's personal goals.       Expected Outcomes  Short Term: Increase workloads from initial exercise prescription for resistance, speed, and METs.;Short Term: Perform resistance training exercises routinely during rehab and add in resistance training at home;Long Term: Improve cardiorespiratory fitness, muscular endurance and strength as measured by increased METs and functional capacity (6MWT)       Able to understand and use rate of  perceived exertion (RPE) scale  Yes       Intervention  Provide education and explanation on how to use RPE scale       Expected Outcomes  Short Term: Able to use RPE daily in rehab to express subjective intensity level;Long Term:  Able to use RPE to guide intensity level when exercising independently       Able to understand and use Dyspnea scale  Yes       Intervention  Provide education and explanation on how to use Dyspnea scale       Expected Outcomes  Short Term: Able to use Dyspnea scale daily in rehab to express subjective sense of shortness of breath  during exertion;Long Term: Able to use Dyspnea scale to guide intensity level when exercising independently       Knowledge and understanding of Target Heart Rate Range (THRR)  Yes       Intervention  Provide education and explanation of THRR including how the numbers were predicted and where they are located for reference       Expected Outcomes  Short Term: Able to state/look up THRR;Short Term: Able to use daily as guideline for intensity in rehab;Long Term: Able to use THRR to govern intensity when exercising independently       Able to check pulse independently  Yes       Intervention  Provide education and demonstration on how to check pulse in carotid and radial arteries.;Review the importance of being able to check your own pulse for safety during independent exercise       Expected Outcomes  Short Term: Able to explain why pulse checking is important during independent exercise;Long Term: Able to check pulse independently and accurately       Understanding of Exercise Prescription  Yes       Intervention  Provide education, explanation, and written materials on patient's individual exercise prescription       Expected Outcomes  Short Term: Able to explain program exercise prescription;Long Term: Able to explain home exercise prescription to exercise independently          Exercise Goals Re-Evaluation : Exercise Goals Re-Evaluation    Row Name 03/26/18 1729 04/26/18 0816 05/10/18 0914 05/22/18 1346       Exercise Goal Re-Evaluation   Exercise Goals Review  Increase Physical Activity;Able to understand and use rate of perceived exertion (RPE) scale;Knowledge and understanding of Target Heart Rate Range (THRR);Understanding of Exercise Prescription;Increase Strength and Stamina;Able to understand and use Dyspnea scale  Increase Physical Activity;Increase Strength and Stamina;Able to understand and use rate of perceived exertion (RPE) scale;Knowledge and understanding of Target Heart Rate  Range (THRR);Understanding of Exercise Prescription  Increase Physical Activity;Increase Strength and Stamina;Able to understand and use rate of perceived exertion (RPE) scale;Able to understand and use Dyspnea scale;Knowledge and understanding of Target Heart Rate Range (THRR)  Increase Physical Activity;Increase Strength and Stamina;Able to understand and use rate of perceived exertion (RPE) scale;Knowledge and understanding of Target Heart Rate Range (THRR);Able to check pulse independently    Comments  Reviewed RPE scale, THR and program prescription with pt today.  Pt voiced understanding and was given a copy of goals to take home.   Today is [name]'s first day back this month due to illness and vertigo.  Staff will monitor progress.  [name] was out quite a bit with sickness in February.  Staff will monitor progress as he gets back to a regular routine.  Our programs are currently  closed due to COVID 19.  Staff are following up with patients via email and phone calls.    Expected Outcomes  Short: Use RPE daily to regulate intensity. Long: Follow program prescription in THR.  Short - attend class consistently Long - Increase MET level  Short - attend class consistently Long - improve overall MET level  -       Discharge Exercise Prescription (Final Exercise Prescription Changes): Exercise Prescription Changes - 05/10/18 0900      Response to Exercise   Blood Pressure (Admit)  110/58    Blood Pressure (Exercise)  152/68    Blood Pressure (Exit)  132/60    Heart Rate (Admit)  63 bpm    Heart Rate (Exercise)  69 bpm    Heart Rate (Exit)  58 bpm    Rating of Perceived Exertion (Exercise)  11    Duration  Progress to 45 minutes of aerobic exercise without signs/symptoms of physical distress    Intensity  Other (comment)   use RPE     Progression   Progression  Continue to progress workloads to maintain intensity without signs/symptoms of physical distress.    Average METs  4      Resistance  Training   Training Prescription  Yes    Weight  3 lb    Reps  10-15      Interval Training   Interval Training  No      NuStep   Level  4    SPM  80    Minutes  15    METs  3.6      REL-XR   Level  1    Speed  50    Minutes  15    METs  4.4       Nutrition:  Target Goals: Understanding of nutrition guidelines, daily intake of sodium <1528m, cholesterol <2038m calories 30% from fat and 7% or less from saturated fats, daily to have 5 or more servings of fruits and vegetables.  Biometrics: Pre Biometrics - 03/19/18 1249      Pre Biometrics   Height  5' 11.9" (1.826 m)    Weight  220 lb (99.8 kg)    Waist Circumference  42.5 inches    Hip Circumference  43.5 inches    Waist to Hip Ratio  0.98 %    BMI (Calculated)  29.93    Single Leg Stand  6.17 seconds        Nutrition Therapy Plan and Nutrition Goals: Nutrition Therapy & Goals - 03/19/18 1319      Intervention Plan   Intervention  Prescribe, educate and counsel regarding individualized specific dietary modifications aiming towards targeted core components such as weight, hypertension, lipid management, diabetes, heart failure and other comorbidities.    Expected Outcomes  Short Term Goal: Understand basic principles of dietary content, such as calories, fat, sodium, cholesterol and nutrients.;Short Term Goal: A plan has been developed with personal nutrition goals set during dietitian appointment.;Long Term Goal: Adherence to prescribed nutrition plan.       Nutrition Assessments: Nutrition Assessments - 03/19/18 1333      Rate Your Plate Scores   Pre Score  31       Nutrition Goals Re-Evaluation:   Nutrition Goals Discharge (Final Nutrition Goals Re-Evaluation):   Psychosocial: Target Goals: Acknowledge presence or absence of significant depression and/or stress, maximize coping skills, provide positive support system. Participant is able to verbalize types and ability to use techniques and skills  needed for reducing stress and depression.   Initial Review & Psychosocial Screening: Initial Psych Review & Screening - 03/19/18 1318      Initial Review   Current issues with  Current Sleep Concerns   Not able to fall asleep.  Best sleep is between 6 to 9 AM     [place]?  Yes   Spouse     Barriers   Psychosocial barriers to participate in program  There are no identifiable barriers or psychosocial needs.;The patient should benefit from training in stress management and relaxation.      Screening Interventions   Interventions  Encouraged to exercise;To provide support and resources with identified psychosocial needs;Provide feedback about the scores to participant    Expected Outcomes  Short Term goal: Utilizing psychosocial counselor, staff and physician to assist with identification of specific Stressors or current issues interfering with healing process. Setting desired goal for each stressor or current issue identified.;Long Term Goal: Stressors or current issues are controlled or eliminated.;Short Term goal: Identification and review with participant of any Quality of Life or Depression concerns found by scoring the questionnaire.;Long Term goal: The participant improves quality of Life and PHQ9 Scores as seen by post scores and/or verbalization of changes       Quality of Life Scores:  Quality of Life - 03/19/18 1318      Quality of Life   Select  Quality of Life      Quality of Life Scores   Health/Function Pre  19.6 %    Socioeconomic Pre  30 %    Psych/Spiritual Pre  30 %    Family Pre  27.6 %    GLOBAL Pre  25.2 %      Scores of 19 and below usually indicate a poorer quality of life in these areas.  A difference of  2-3 points is a clinically meaningful difference.  A difference of 2-3 points in the total score of the Quality of Life Index has been associated with significant improvement in overall quality of life, self-image, physical  symptoms, and general health in studies assessing change in quality of life.  PHQ-9: Recent Review Flowsheet Data    Depression screen [facility] 2/9 03/19/2018 03/05/2018 12/26/2017 05/06/2016 10/05/2015   Decreased Interest 0 0 0 0 0   Down, Depressed, Hopeless 0 0 0 0 0   PHQ - 2 Score 0 0 0 0 0   Altered sleeping 1 - - 2 1   Tired, decreased energy 1 - - 2 2   Change in appetite 1 - - 1 2   Feeling bad or failure about yourself  0 - - 0 0   Trouble concentrating 0 - - 0 0   Moving slowly or fidgety/restless 0 - - 0 0   Suicidal thoughts 0 - - 0 0   PHQ-9 Score 3 - - 5 5   Difficult doing work/chores - - - Not difficult at all Not difficult at all     Interpretation of Total Score  Total Score Depression Severity:  1-4 = Minimal depression, 5-9 = Mild depression, 10-14 = Moderate depression, 15-19 = Moderately severe depression, 20-27 = Severe depression   Psychosocial Evaluation and Intervention: Psychosocial Evaluation - 03/28/18 1715      Psychosocial Evaluation & Interventions   Interventions  Encouraged to exercise with the program and follow exercise prescription    Comments  Counselor met with [name] [name] [facility]) today as he  has returned to this program after more than a year of being out.  This initial psychosocial evaluation indicates an 85 year old who has a history of heart disease - including CABGx5 13 years ago.  He has a strong support system with a spouse of 10 years; (4) adult children and active involvement in his local church.  [name] also struggles with type 2 diabetes.  He reports not sleeping well but gets about 6-8 hours of interrupted sleep and his appetite is not very good - but he is not losing weight at this time.  He denies a history of depression or anxiety or any current symptoms and is typically in a positive mood most of the time.  He has goals to breathe better; increase his stamina and maybe lose some weight this time while in this program.  Staff will follow with him.      Expected Outcomes  Short:  [name] will exercise to increase his ability to breathe and improve his stamina.  He will meet with the dietician to address his weight loss goals.  Long:  [name] will exercise consistently for his health and for weight loss.      Continue Psychosocial Services   Follow up required by staff       Psychosocial Re-Evaluation: Psychosocial Re-Evaluation    [place] Name 05/25/18 1023             Psychosocial Re-Evaluation   Current issues with  Current Stress Concerns;Current Sleep Concerns       Comments  [name] says he has no new stress and is sleeping about the same as before.  he is staying busy at home with current situation.       Expected Outcomes  Short - maintain positve mental outlook Long - continue to manage stress successfully          Psychosocial Discharge (Final Psychosocial Re-Evaluation): Psychosocial Re-Evaluation - 05/25/18 1023      Psychosocial Re-Evaluation   Current issues with  Current Stress Concerns;Current Sleep Concerns    Comments  [name] says he has no new stress and is sleeping about the same as before.  he is staying busy at home with current situation.    Expected Outcomes  Short - maintain positve mental outlook Long - continue to manage stress successfully       Vocational Rehabilitation: Provide vocational rehab assistance to qualifying candidates.   Vocational Rehab Evaluation & Intervention: Vocational Rehab - 03/19/18 1320      Initial Vocational Rehab Evaluation & Intervention   Assessment shows need for Vocational Rehabilitation  No       Education: Education Goals: Education classes will be provided on a variety of topics geared toward better understanding of heart health and risk factor modification. Participant will state understanding/return demonstration of topics presented as noted by education test scores.  Learning Barriers/Preferences: Learning Barriers/Preferences - 03/19/18 1320      Learning  Barriers/Preferences   Learning Barriers  None    Learning Preferences  None       Education Topics:  AED/CPR: - Group verbal and written instruction with the use of models to demonstrate the basic use of the AED with the basic ABC's of resuscitation.   Cardiac Rehab from 05/09/2018 in [facility] Cardiac and Pulmonary Rehab  Date  04/30/18  Educator  CE  Instruction Review Code  1- Verbalizes Understanding      General Nutrition Guidelines/Fats and Fiber: -Group instruction provided by verbal, written material, models and  posters to present the general guidelines for heart healthy nutrition. Gives an explanation and review of dietary fats and fiber.   Controlling Sodium/Reading Food Labels: -Group verbal and written material supporting the discussion of sodium use in heart healthy nutrition. Review and explanation with models, verbal and written materials for utilization of the food label.   Exercise Physiology & General Exercise Guidelines: - Group verbal and written instruction with models to review the exercise physiology of the cardiovascular system and associated critical values. Provides general exercise guidelines with specific guidelines to those with heart or lung disease.    Cardiac Rehab from 05/09/2018 in [facility] Cardiac and Pulmonary Rehab  Date  03/26/18  Educator  [facility]  Instruction Review Code  1- Verbalizes Understanding      Aerobic Exercise & Resistance Training: - Gives group verbal and written instruction on the various components of exercise. Focuses on aerobic and resistive training programs and the benefits of this training and how to safely progress through these programs..   Cardiac Rehab from 05/09/2018 in [facility] Cardiac and Pulmonary Rehab  Date  03/28/18  Educator  AS  Instruction Review Code  1- Verbalizes Understanding      Flexibility, Balance, Mind/Body Relaxation: Provides group verbal/written instruction on the benefits of flexibility and balance training,  including mind/body exercise modes such as yoga, pilates and tai chi.  Demonstration and skill practice provided.   Cardiac Rehab from 05/09/2018 in [facility] Cardiac and Pulmonary Rehab  Date  04/02/18  Educator  AS  Instruction Review Code  1- Verbalizes Understanding      Stress and Anxiety: - Provides group verbal and written instruction about the health risks of elevated stress and causes of high stress.  Discuss the correlation between heart/lung disease and anxiety and treatment options. Review healthy ways to manage with stress and anxiety.   Cardiac Rehab from 05/09/2018 in [facility] Cardiac and Pulmonary Rehab  Date  04/25/18  Educator  [facility]  Instruction Review Code  1- Verbalizes Understanding      Depression: - Provides group verbal and written instruction on the correlation between heart/lung disease and depressed mood, treatment options, and the stigmas associated with seeking treatment.   Cardiac Rehab from 05/09/2018 in [facility] Cardiac and Pulmonary Rehab  Date  05/09/18  Educator  KG  Instruction Review Code  1- Verbalizes Understanding      Anatomy & Physiology of the Heart: - Group verbal and written instruction and models provide basic cardiac anatomy and physiology, with the coronary electrical and arterial systems. Review of Valvular disease and Heart Failure   Cardiac Rehab from 04/18/2016 in [facility] Cardiac and Pulmonary Rehab  Date  02/08/16  Educator  SB  Instruction Review Code (retired)  2- meets goals/outcomes      Cardiac Procedures: - Group verbal and written instruction to review commonly prescribed medications for heart disease. Reviews the medication, class of the drug, and side effects. Includes the steps to properly store meds and maintain the prescription regimen. (beta blockers and nitrates)   Cardiac Rehab from 04/18/2016 in [facility] Cardiac and Pulmonary Rehab  Date  04/18/16  Educator  CE  Instruction Review Code (retired)  2- meets goals/outcomes      Cardiac  Medications I: - Group verbal and written instruction to review commonly prescribed medications for heart disease. Reviews the medication, class of the drug, and side effects. Includes the steps to properly store meds and maintain the prescription regimen.   Cardiac Medications II: -Group verbal and written  instruction to review commonly prescribed medications for heart disease. Reviews the medication, class of the drug, and side effects. (all other drug classes)   Cardiac Rehab from 05/09/2018 in [facility] Cardiac and Pulmonary Rehab  Date  04/04/18  Educator  [facility]  Instruction Review Code  1- Verbalizes Understanding       Go Sex-Intimacy & Heart Disease, Get SMART - Goal Setting: - Group verbal and written instruction through game format to discuss heart disease and the return to sexual intimacy. Provides group verbal and written material to discuss and apply goal setting through the application of the S.M.A.R.T. Method.   Cardiac Rehab from 04/18/2016 in [facility] Cardiac and Pulmonary Rehab  Date  04/18/16  Educator  CE  Instruction Review Code (retired)  2- meets goals/outcomes      Other Matters of the Heart: - Provides group verbal, written materials and models to describe Stable Angina and Peripheral Artery. Includes description of the disease process and treatment options available to the cardiac patient.   Cardiac Rehab from 04/18/2016 in [facility] Cardiac and Pulmonary Rehab  Date  04/11/16  Educator  C. Enterkin, RN  Instruction Review Code (retired)  2- meets goals/outcomes      Exercise & Equipment Safety: - Individual verbal instruction and demonstration of equipment use and safety with use of the equipment.   Cardiac Rehab from 05/09/2018 in [facility] Cardiac and Pulmonary Rehab  Date  03/19/18  Educator  SB  Instruction Review Code  1- Verbalizes Understanding      Infection Prevention: - Provides verbal and written material to individual with discussion of infection control including  proper hand washing and proper equipment cleaning during exercise session.   Cardiac Rehab from 05/09/2018 in [facility] Cardiac and Pulmonary Rehab  Date  03/19/18  Educator  SB  Instruction Review Code  1- Verbalizes Understanding      Falls Prevention: - Provides verbal and written material to individual with discussion of falls prevention and safety.   Cardiac Rehab from 05/09/2018 in [facility] Cardiac and Pulmonary Rehab  Date  03/19/18  Educator  SB  Instruction Review Code  1- Verbalizes Understanding      Diabetes: - Individual verbal and written instruction to review signs/symptoms of diabetes, desired ranges of glucose level fasting, after meals and with exercise. Acknowledge that pre and post exercise glucose checks will be done for 3 sessions at entry of program.   Cardiac Rehab from 05/09/2018 in [facility] Cardiac and Pulmonary Rehab  Date  03/19/18  Educator  SB  Instruction Review Code  1- Verbalizes Understanding      Know Your Numbers and Risk Factors: -Group verbal and written instruction about important numbers in your health.  Discussion of what are risk factors and how they play a role in the disease process.  Review of Cholesterol, Blood Pressure, Diabetes, and BMI and the role they play in your overall health.   Cardiac Rehab from 05/09/2018 in [facility] Cardiac and Pulmonary Rehab  Date  04/04/18  Educator  [facility]  Instruction Review Code  1- Verbalizes Understanding      Sleep Hygiene: -Provides group verbal and written instruction about how sleep can affect your health.  Define sleep hygiene, discuss sleep cycles and impact of sleep habits. Review good sleep hygiene tips.    Other: -Provides group and verbal instruction on various topics (see comments)   Knowledge Questionnaire Score: Knowledge Questionnaire Score - 03/19/18 1321      Knowledge Questionnaire Score   Pre Score  22/26   Reviewed correct response with [name] today. He verbalized understanding and had no further  questions today      Core Components/Risk Factors/Patient Goals at Admission: Personal Goals and Risk Factors at Admission - 03/19/18 1322      Core Components/Risk Factors/Patient Goals on Admission    Weight Management  Yes;Obesity    Intervention  Weight Management: Develop a combined nutrition and exercise program designed to reach desired caloric intake, while maintaining appropriate intake of nutrient and fiber, sodium and fats, and appropriate energy expenditure required for the weight goal.;Weight Management/Obesity: Establish reasonable short term and long term weight goals.    Admit Weight  220 lb (99.8 kg)    Goal Weight: Short Term  218 lb (98.9 kg)    Goal Weight: Long Term  200 lb (90.7 kg)    Expected Outcomes  Short Term: Continue to assess and modify interventions until short term weight is achieved;Long Term: Adherence to nutrition and physical activity/exercise program aimed toward attainment of established weight goal;Weight Loss: Understanding of general recommendations for a balanced deficit meal plan, which promotes 1-2 lb weight loss per week and includes a negative energy balance of [phone] kcal/d    Diabetes  Yes    Intervention  Provide education about signs/symptoms and action to take for hypo/hyperglycemia.;Provide education about proper nutrition, including hydration, and aerobic/resistive exercise prescription along with prescribed medications to achieve blood glucose in normal ranges: Fasting glucose 65-99 mg/dL    Expected Outcomes  Short Term: Participant verbalizes understanding of the signs/symptoms and immediate care of hyper/hypoglycemia, proper foot care and importance of medication, aerobic/resistive exercise and nutrition plan for blood glucose control.;Long Term: Attainment of HbA1C < 7%.    Heart Failure  Yes    Intervention  Provide a combined exercise and nutrition program that is supplemented with education, support and counseling about heart failure.  Directed toward relieving symptoms such as shortness of breath, decreased exercise tolerance, and extremity edema.    Expected Outcomes  Improve functional capacity of life;Short term: Attendance in program 2-3 days a week with increased exercise capacity. Reported lower sodium intake. Reported increased fruit and vegetable intake. Reports medication compliance.;Short term: Daily weights obtained and reported for increase. Utilizing diuretic protocols set by physician.;Long term: Adoption of self-care skills and reduction of barriers for early signs and symptoms recognition and intervention leading to self-care maintenance.    Hypertension  Yes    Intervention  Provide education on lifestyle modifcations including regular physical activity/exercise, weight management, moderate sodium restriction and increased consumption of fresh fruit, vegetables, and low fat dairy, alcohol moderation, and smoking cessation.;Monitor prescription use compliance.    Expected Outcomes  Short Term: Continued assessment and intervention until BP is < 140/25m HG in hypertensive participants. < 130/867mHG in hypertensive participants with diabetes, heart failure or chronic kidney disease.;Long Term: Maintenance of blood pressure at goal levels.    Lipids  Yes    Intervention  Provide education and support for participant on nutrition & aerobic/resistive exercise along with prescribed medications to achieve LDL <7036mHDL >69m70m  Expected Outcomes  Short Term: Participant states understanding of desired cholesterol values and is compliant with medications prescribed. Participant is following exercise prescription and nutrition guidelines.;Long Term: Cholesterol controlled with medications as prescribed, with individualized exercise RX and with personalized nutrition plan. Value goals: LDL < 70mg69mL > 40 mg.       Core Components/Risk Factors/Patient Goals Review:  Goals and Risk Factor Review  [place] Name 05/25/18 1020              Core Components/Risk Factors/Patient Goals Review   Personal Goals Review  Weight Management/Obesity;Diabetes;Heart Failure;Hypertension;Lipids       Review  [name] is getting exercise riding his bike and doing spring cleaning.  He is monitoring his BG - 111 today.  he is still eating healthy.         Expected Outcomes  Short - Continue to exercise at home and eat heart healthy diet Long - maintain exercise on his own          Core Components/Risk Factors/Patient Goals at Discharge (Final Review):  Goals and Risk Factor Review - 05/25/18 1020      Core Components/Risk Factors/Patient Goals Review   Personal Goals Review  Weight Management/Obesity;Diabetes;Heart Failure;Hypertension;Lipids    Review  [name] is getting exercise riding his bike and doing spring cleaning.  He is monitoring his BG - 111 today.  he is still eating healthy.      Expected Outcomes  Short - Continue to exercise at home and eat heart healthy diet Long - maintain exercise on his own       ITP Comments: ITP Comments    [place] Name 03/19/18 1304 04/04/18 1411 04/25/18 1819 05/02/18 0627 05/22/18 1347   ITP Comments  Med review completed today. Initial ITP sent to Dr Loleta Chance for review,changes as needed and signature. Diagnosis documentation can be found in [facility] 03/12/2018  30 day review completed. ITP sent to Dr. Emily Filbert, Medical Director of Cardiac Rehab. Continue with ITP unless changes are made by physician.  Today is Mels first day back afte rbeing out due to illness and vertigo.  30 day review. Continue with ITP unless directed changes by Medical Director chart review.  Our programs are currently closed due to COVID 19.  Staff are following up with patients via email and phone calls.   [place] Name 05/30/18 1335           ITP Comments  30 day review completed. Continue with ITP unless directed changes by Medical Director at review          Comments:

## 2018-06-28 NOTE — Progress Notes (Signed)
LMOM

## 2018-07-05 DIAGNOSIS — M7918 Myalgia, other site: Secondary | ICD-10-CM | POA: Diagnosis not present

## 2018-07-05 DIAGNOSIS — R0781 Pleurodynia: Secondary | ICD-10-CM | POA: Diagnosis not present

## 2018-07-06 ENCOUNTER — Telehealth: Payer: Self-pay

## 2018-07-06 NOTE — Telephone Encounter (Signed)
Virtual Visit Pre-Appointment Phone Call  "(Name), I am calling you today to discuss your upcoming appointment. We are currently trying to limit exposure to the virus that causes COVID-19 by seeing patients at home rather than in the office."  1. "What is the BEST phone number to call the day of the visit?" - include this in appointment notes  2. Do you have or have access to (through a family member/friend) a smartphone with video capability that we can use for your visit?" a. If yes - list this number in appt notes as cell (if different from BEST phone #) and list the appointment type as a VIDEO visit in appointment notes b. If no - list the appointment type as a PHONE visit in appointment notes  3. Confirm consent - "In the setting of the current Covid19 crisis, you are scheduled for a (phone or video) visit with your provider on (date) at (time).  Just as we do with many in-office visits, in order for you to participate in this visit, we must obtain consent.  If you'd like, I can send this to your mychart (if signed up) or email for you to review.  Otherwise, I can obtain your verbal consent now.  All virtual visits are billed to your insurance company just like a normal visit would be.  By agreeing to a virtual visit, we'd like you to understand that the technology does not allow for your provider to perform an examination, and thus may limit your provider's ability to fully assess your condition. If your provider identifies any concerns that need to be evaluated in person, we will make arrangements to do so.  Finally, though the technology is pretty good, we cannot assure that it will always work on either your or our end, and in the setting of a video visit, we may have to convert it to a phone-only visit.  In either situation, we cannot ensure that we have a secure connection.  Are you willing to proceed?" STAFF: Did the patient verbally acknowledge consent to telehealth visit? Document  YES/NO here: YES  4. Advise patient to be prepared - "Two hours prior to your appointment, go ahead and check your blood pressure, pulse, oxygen saturation, and your weight (if you have the equipment to check those) and write them all down. When your visit starts, your provider will ask you for this information. If you have an Apple Watch or Kardia device, please plan to have heart rate information ready on the day of your appointment. Please have a pen and paper handy nearby the day of the visit as well."  5. Give patient instructions for MyChart download to smartphone OR Doximity/[email or web address] as below if video visit (depending on what platform provider is using)  6. Inform patient they will receive a phone call 15 minutes prior to their appointment time (may be from unknown caller ID) so they should be prepared to answer    TELEPHONE CALL NOTE  [name] [name] has been deemed a candidate for a follow-up tele-health visit to limit community exposure during the Covid-19 pandemic. I spoke with the patient via phone to ensure availability of phone/video source, confirm preferred email & phone number, and discuss instructions and expectations.  I reminded [name] [name] to be prepared with any vital sign and/or heart rhythm information that could potentially be obtained via home monitoring, at the time of his visit. I reminded [name] [name] to expect a phone call prior to  his visit.  Clarisse Gouge 07/06/2018 3:42 PM   INSTRUCTIONS FOR DOWNLOADING THE MYCHART APP TO SMARTPHONE  - The patient must first make sure to have activated MyChart and know their login information - If Apple, go to [employer] and type in MyChart in the search bar and download the app. If Android, ask patient to go to [employer] and type in [place] in the search bar and download the app. The app is free but as with any other app downloads, their phone may require them to verify saved payment information or  Apple/Android password.  - The patient will need to then log into the app with their MyChart username and password, and select [facility] as their healthcare provider to link the account. When it is time for your visit, go to the MyChart app, find appointments, and click Begin Video Visit. Be sure to Select Allow for your device to access the Microphone and Camera for your visit. You will then be connected, and your provider will be with you shortly.  **If they have any issues connecting, or need assistance please contact MyChart service desk [phone])**  **If using a computer, in order to ensure the best quality for their visit they will need to use either of the following Internet Browsers: [employer], or Google Chrome**  IF USING DOXIMITY or DOXY.ME - The patient will receive a link just prior to their visit by text.     FULL LENGTH CONSENT FOR TELE-HEALTH VISIT   I hereby voluntarily request, consent and authorize [facility] and its employed or contracted physicians, physician assistants, nurse practitioners or other licensed health care professionals (the Practitioner), to provide me with telemedicine health care services (the Services") as deemed necessary by the treating Practitioner. I acknowledge and consent to receive the Services by the Practitioner via telemedicine. I understand that the telemedicine visit will involve communicating with the Practitioner through live audiovisual communication technology and the disclosure of certain medical information by electronic transmission. I acknowledge that I have been given the opportunity to request an in-person assessment or other available alternative prior to the telemedicine visit and am voluntarily participating in the telemedicine visit.  I understand that I have the right to withhold or withdraw my consent to the use of telemedicine in the course of my care at any time, without affecting my right to future care  or treatment, and that the Practitioner or I may terminate the telemedicine visit at any time. I understand that I have the right to inspect all information obtained and/or recorded in the course of the telemedicine visit and may receive copies of available information for a reasonable fee.  I understand that some of the potential risks of receiving the Services via telemedicine include:   Delay or interruption in medical evaluation due to technological equipment failure or disruption;  Information transmitted may not be sufficient (e.g. poor resolution of images) to allow for appropriate medical decision making by the Practitioner; and/or   In rare instances, security protocols could fail, causing a breach of personal health information.  Furthermore, I acknowledge that it is my responsibility to provide information about my medical history, conditions and care that is complete and accurate to the best of my ability. I acknowledge that Practitioner's advice, recommendations, and/or decision may be based on factors not within their control, such as incomplete or inaccurate data provided by me or distortions of diagnostic images or specimens that may result from electronic transmissions. I  understand that the practice of medicine is not an exact science and that Practitioner makes no warranties or guarantees regarding treatment outcomes. I acknowledge that I will receive a copy of this consent concurrently upon execution via email to the email address I last provided but may also request a printed copy by calling the office of [facility].    I understand that my insurance will be billed for this visit.   I have read or had this consent read to me.  I understand the contents of this consent, which adequately explains the benefits and risks of the Services being provided via telemedicine.   I have been provided ample opportunity to ask questions regarding this consent and the Services and have had  my questions answered to my satisfaction.  I give my informed consent for the services to be provided through the use of telemedicine in my medical care  By participating in this telemedicine visit I agree to the above.

## 2018-07-06 NOTE — Telephone Encounter (Signed)
Called patient.  No answer. LMOV.  Need to obtain consent.  Need to preform a Doxy trial

## 2018-07-08 NOTE — Progress Notes (Signed)
Virtual Visit via Video Note   This visit type was conducted due to national recommendations for restrictions regarding the COVID-19 Pandemic (e.g. social distancing) in an effort to limit this patient's exposure and mitigate transmission in our community.  Due to his co-morbid illnesses, this patient is at least at moderate risk for complications without adequate follow up.  This format is felt to be most appropriate for this patient at this time.  All issues noted in this document were discussed and addressed.  A limited physical exam was performed with this format.  Please refer to the patient's chart for his consent to telehealth for [facility].   I connected with  [name] [name] on 07/09/18 by a video enabled telemedicine application and verified that I am speaking with the correct person using two identifiers. I discussed the limitations of evaluation and management by telemedicine. The patient expressed understanding and agreed to proceed.   Evaluation Performed:  Follow-up visit  Date:  07/09/2018   ID:  [name] [name], DOB Apr 11, 1933, MRN [id number]  Patient Location:  [address] [id number]   Provider location:   [facility], [place] office  PCP:  [name], MD  Cardiologist:  Arvid Right [facility]   Chief Complaint:  SOB   History of Present Illness:    [name] [name] is a 85 y.o. male who presents via audio/video conferencing for a telehealth visit today.   The patient does not symptoms concerning for COVID-19 infection (fever, chills, cough, or new SHORTNESS OF BREATH).   Patient has a past medical history of coronary artery disease, bypass surgery in November 2006, Stress test March 2017, cath 09/2015, medical management Occluded proximal LAD  Critical/severe native RCA disease  Vein graft to the diagonal is occluded  Other vein grafts patent to the RCA/PDA, vein graft to the OM 3, LIMA graft  EF 55% in  12/2013 diabetes, numbers running high obesity,  hyperlipidemia,  hypertension,  erectile dysfunction Chronic SOB, nonsmoker Postherpetic neuralgia left flank Stable angina, chronic diastolic CHF, chronic shortness of breath Pulmonary hypertension on right heart catheterization who presents for routine followup of his coronary artery disease, and hypertension  Stopped heart track because of virus Using the bike Still SOB, has to sit down frequently such as when walking through [employer]  Lasix 5 days a week BID, with potassium ramapril BID  164/72 at home, but ciff always runs high, at least 10 points 164/77  Walk in clinic, right shoulder pain 122/64  With endocrine 133/53 With internal med 120/60  Chronic left leg swelling, prior trauma Things have improved with extra Lasix  Hemoglobin A1c 6.9  Following catheterization Lasix increased up to twice daily, started on amlodipine, ramipril for better blood pressure control  Tolerating statin daily  Discussed prior pulmonary work-up with him in detail  PFT mild to moderate obstruction   Other past medical history reviewed    Provide some blood pressure measurements from home, all of these are elevated typically 676 up to 720 systolic He is checking his blood pressure before taking his morning medications Blood pressure today 124 on first check systolic on my check 947 systolic Rare episodes of dizziness  Problems with insomnia  Prior stress test March 2017 small fixed defect anteroseptal location, apical region  History of Bell's palsy, Left side of face March 2019  2 days after doing cataract surgery Completed prednisone and antivirals Improved sx, still difficulty  smoking, right facial weakness  Chronic Joint pain, shoulder Leg cramps at night at times, worse with cortisone  Chronic hand, back pain  Seen in the past by specialist for his back in [place], had MRI showing arthritis no spinal  stenosis  Prior CV studies:   The following studies were reviewed today:  CT scan of his abdomen reviewed showing mild diffuse aortic atherosclerosis  Echocardiogram April 2009 shows normal systolic function with mild LVH, diastolic relaxation abnormality, mildly enlarged left atrium, mild aortic insufficiency.  Cardiac CT scan in March 2009 showed severe coronary artery disease with patent grafts x4, saphenous vein graft to the PDA, OM, diagonal and a LIMA graft to the LAD.  catheterization for chest pain 06/05/2013 that showed patent grafts with LIMA to the LAD, vein graft to the diagonal, vein graft to the OM 3, vein graft to the distal RCA. Normal filling pressures and normal LV function.  Cardiac catheterization January 05, 2018 Moderate pulmonary hypertension by right heart pressures Severe three-vessel disease Occluded proximal RCA, occluded ostial LAD LIMA graft to the LAD patent, vein graft to the PDA patent The findings this catheterization shows occluded vein graft to the OM 3, now occluded proximal native RCA  Echocardiogram January 08, 2018 Left ventricle: The cavity size was normal. There was mild concentric hypertrophy. Systolic function was normal. The estimated ejection fraction was in the range of 55% to 60%. Wall motion was normal; there were no regional wall motion abnormalities. Features are consistent with a pseudonormal left ventricular filling pattern, with concomitant abnormal relaxation and increased filling pressure (grade 2 diastolic dysfunction). - Aortic valve: There was trivial regurgitation. - Mitral valve: Calcified annulus. - Left atrium: The atrium was mildly dilated. - Pulmonary arteries: Systolic pressure was mildly increased.  Past Medical History:  Diagnosis Date   BPH (benign prostatic hyperplasia)    Cancer ([place])    skin   Cataract    Coronary artery disease    a. 4v CABG 01/2005; b. cath 06/05/13 showed patent grafts:  LIMA-LAD, VG-D, VG-OM3, VG-dRCA, nl filling pressures, nl LV fxn   Diastolic dysfunction    a. echo 10/8914: nl systolic fxn, mild LVH, diastolic relaxation abnormality, mildly enlarged LA, mild Ao insufficiency   Dyspnea    with exertion   ED (erectile dysfunction)    GERD (gastroesophageal reflux disease)    Hyperlipidemia    Hypertension    IDDM (insulin dependent diabetes mellitus) ([place]) 2000   Osteoarthritis    Perirectal fistula    Pneumonia 12/2016   Tubular adenoma of colon 07/2012   Vertigo    Past Surgical History:  Procedure Laterality Date   BACK SURGERY     CARDIAC CATHETERIZATION  06/05/2013   [facility]    CARDIAC CATHETERIZATION N/A 09/24/2015   Procedure: Right Heart Cath and Coronary/Graft Angiography;  Surgeon: Minna Merritts, MD;  Location: [facility];  Service: Cardiovascular;  Laterality: N/A;   CATARACT EXTRACTION  sept 2013   right   CATARACT EXTRACTION W/PHACO Left 03/28/2017   Procedure: CATARACT EXTRACTION PHACO AND INTRAOCULAR LENS PLACEMENT (IOC);  Surgeon: Birder Robson, MD;  Location: [facility];  Service: Ophthalmology;  Laterality: Left;  [place] 00:42.0 AP% 15.0 CDE 6.31 Fluid Pack lot # [id number]   CHOLECYSTECTOMY  05/15/2011   Procedure: LAPAROSCOPIC CHOLECYSTECTOMY;  Surgeon: Rolm Bookbinder, MD;  Location: [facility];  Service: General;  Laterality: N/A;   COLONOSCOPY W/ POLYPECTOMY     CORONARY ARTERY BYPASS GRAFT  2007  x 4   EYE SURGERY     FOOT SURGERY  2012   right foot   JOINT REPLACEMENT  8/10   Right THR--[name]   LEFT AND RIGHT HEART CATHETERIZATION WITH CORONARY/GRAFT ANGIOGRAM N/A 06/05/2013   Procedure: LEFT AND RIGHT HEART CATHETERIZATION WITH Beatrix Fetters;  Surgeon: Peter M Martinique, MD;  [facility];  Service: Cardiovascular;  Laterality: N/A;   LUMBAR LAMINECTOMY  1989   Prostate photovaporization  5/16   Dr Budd Palmer   RIGHT/LEFT HEART CATH AND CORONARY/GRAFT  ANGIOGRAPHY N/A 01/05/2018   Procedure: RIGHT/LEFT HEART CATH AND CORONARY/GRAFT ANGIOGRAPHY;  Surgeon: Minna Merritts, MD;  Location: [facility];  Service: Cardiovascular;  Laterality: N/A;     Current Meds  Medication Sig   acetaminophen (TYLENOL) 500 MG tablet Take 1,000 mg by mouth 2 (two) times daily.   amLODipine (NORVASC) 5 MG tablet Take 1 tablet (5 mg total) by mouth 2 (two) times daily.   aspirin EC 81 MG tablet Take 81 mg by mouth daily.   BD INSULIN SYRINGE ULTRAFINE 31G X 15/64" 0.3 ML MISC USE AS DIRECTED   blood glucose meter kit and supplies KIT OneTouch Verio w/ Device Kit OneTouch Verio VI STRP OneTouch Delica Lancet Dev misc OneTouch Delica Lancets 20B or Fine  Please check CBGs fasting and 2 other times during the day  Dx Code E11.8   carvedilol (COREG) 12.5 MG tablet TAKE 1 TABLET BY MOUTH TWICE DAILY   finasteride (PROSCAR) 5 MG tablet Take 1 tablet (5 mg total) by mouth daily. (Patient taking differently: Take 5 mg by mouth at bedtime. )   furosemide (LASIX) 20 MG tablet Take 1 tablet (20 mg) by mouth twice daily 6 days a week   gabapentin (NEURONTIN) 300 MG capsule Take 1 capsule (300 mg total) by mouth at bedtime.   Insulin Pen Needle (BD PEN NEEDLE NANO U/F) 32G X 4 MM MISC USE AS DIRECTED   omeprazole (PRILOSEC) 20 MG capsule TAKE 1 CAPSULE BY MOUTH TWICE DAILY   potassium chloride (K-DUR) 10 MEQ tablet Take 1 tablet (10 meq) by mouth twice daily 6 days a week   ramipril (ALTACE) 10 MG capsule Take 1 capsule (10 mg total) by mouth 2 (two) times daily.   rosuvastatin (CRESTOR) 20 MG tablet Take 1 tablet (20 mg total) by mouth daily.   TOUJEO SOLOSTAR 300 UNIT/ML SOPN INJECT 50-60 UNITS INTO THE SKIN DAILY .INCREASE BY 1 UNITS/DAY UNTILFBS ARE BETWEEN 80-130. DO NOT EXCEED 60 UNITS PER DAY. (Patient taking differently: Inject 46-48 Units into the skin daily. )   traMADol (ULTRAM) 50 MG tablet Take 1 tablet (50 mg total) by mouth 2 (two)  times daily as needed. (Patient taking differently: Take 50 mg by mouth 2 (two) times daily as needed (for pain.). )   traZODone (DESYREL) 50 MG tablet TAKE ONE TO TWO TABLETS BY MOUTH AT BEDTIME AS NEEDED FOR SLEEP   vitamin B-12 (CYANOCOBALAMIN) 50 MCG tablet Take 50 mcg by mouth daily.   Vitamin D, Cholecalciferol, 1000 units TABS Take 1,000 Units by mouth daily.      Allergies:   Cortisone; Metformin; and Metformin and related   Social History   Tobacco Use   Smoking status: Never Smoker   Smokeless tobacco: Never Used  Substance Use Topics   Alcohol use: Yes    Comment: occ cocktail   Drug use: No     Current Outpatient Medications on File Prior to Visit  Medication  Sig Dispense Refill   acetaminophen (TYLENOL) 500 MG tablet Take 1,000 mg by mouth 2 (two) times daily.     amLODipine (NORVASC) 5 MG tablet Take 1 tablet (5 mg total) by mouth 2 (two) times daily. 90 tablet 3   aspirin EC 81 MG tablet Take 81 mg by mouth daily.     BD INSULIN SYRINGE ULTRAFINE 31G X 15/64" 0.3 ML MISC USE AS DIRECTED 100 each 3   blood glucose meter kit and supplies KIT OneTouch Verio w/ Device Kit OneTouch Verio VI STRP OneTouch Delica Lancet Dev misc OneTouch Delica Lancets 07M or Fine  Please check CBGs fasting and 2 other times during the day  Dx Code E11.8 1 each 0   carvedilol (COREG) 12.5 MG tablet TAKE 1 TABLET BY MOUTH TWICE DAILY 180 tablet 4   finasteride (PROSCAR) 5 MG tablet Take 1 tablet (5 mg total) by mouth daily. (Patient taking differently: Take 5 mg by mouth at bedtime. ) 90 tablet 1   furosemide (LASIX) 20 MG tablet Take 1 tablet (20 mg) by mouth twice daily 6 days a week     gabapentin (NEURONTIN) 300 MG capsule Take 1 capsule (300 mg total) by mouth at bedtime. 30 capsule 11   Insulin Pen Needle (BD PEN NEEDLE NANO U/F) 32G X 4 MM MISC USE AS DIRECTED 100 each 11   omeprazole (PRILOSEC) 20 MG capsule TAKE 1 CAPSULE BY MOUTH TWICE DAILY 60 capsule 2    potassium chloride (K-DUR) 10 MEQ tablet Take 1 tablet (10 meq) by mouth twice daily 6 days a week     ramipril (ALTACE) 10 MG capsule Take 1 capsule (10 mg total) by mouth 2 (two) times daily. 180 capsule 4   rosuvastatin (CRESTOR) 20 MG tablet Take 1 tablet (20 mg total) by mouth daily. 90 tablet 4   TOUJEO SOLOSTAR 300 UNIT/ML SOPN INJECT 50-60 UNITS INTO THE SKIN DAILY .INCREASE BY 1 UNITS/DAY UNTILFBS ARE BETWEEN 80-130. DO NOT EXCEED 60 UNITS PER DAY. (Patient taking differently: Inject 46-48 Units into the skin daily. ) 4.5 mL 6   traMADol (ULTRAM) 50 MG tablet Take 1 tablet (50 mg total) by mouth 2 (two) times daily as needed. (Patient taking differently: Take 50 mg by mouth 2 (two) times daily as needed (for pain.). ) 10 tablet 0   traZODone (DESYREL) 50 MG tablet TAKE ONE TO TWO TABLETS BY MOUTH AT BEDTIME AS NEEDED FOR SLEEP 90 tablet 3   vitamin B-12 (CYANOCOBALAMIN) 50 MCG tablet Take 50 mcg by mouth daily.     Vitamin D, Cholecalciferol, 1000 units TABS Take 1,000 Units by mouth daily.      No current facility-administered medications on file prior to visit.      Family Hx: The patient's family history includes Heart disease in his father; Lung cancer in his mother. There is no history of Colon cancer or Breast cancer.  ROS:   Please see the history of present illness.    Review of Systems  Constitutional: Negative.   Respiratory: Positive for shortness of breath.   Cardiovascular: Positive for leg swelling.  Gastrointestinal: Negative.   Musculoskeletal: Negative.   Neurological: Negative.   Psychiatric/Behavioral: Negative.   All other systems reviewed and are negative.     Labs/Other Tests and Data Reviewed:    Recent Labs: 12/27/2017: ALT 12; TSH 1.38 01/08/2018: Magnesium 2.0 01/09/2018: Hemoglobin 13.1; Platelets 89 03/02/2018: BUN 24; Creatinine, Ser 1.47; Potassium 4.6; Sodium 140   Recent Lipid Panel  Lab Results  Component Value Date/Time   CHOL 125  12/27/2017 02:46 PM   TRIG 289 (H) 12/27/2017 02:46 PM   HDL 31.00 (L) 08/18/2017 12:05 PM   HDL 39 (L) 07/04/2014 08:01 AM   CHOLHDL 3 08/18/2017 12:05 PM   LDLCALC 41 08/18/2017 12:05 PM   LDLCALC 110 (H) 07/04/2014 08:01 AM   LDLDIRECT 65.0 06/02/2016 02:17 PM    Wt Readings from Last 3 Encounters:  03/19/18 220 lb (99.8 kg)  03/12/18 218 lb 8 oz (99.1 kg)  03/05/18 219 lb 9.6 oz (99.6 kg)     Exam:    Vital Signs: Vital signs may also be detailed in the HPI There were no vitals taken for this visit.  Wt Readings from Last 3 Encounters:  03/19/18 220 lb (99.8 kg)  03/12/18 218 lb 8 oz (99.1 kg)  03/05/18 219 lb 9.6 oz (99.6 kg)   Temp Readings from Last 3 Encounters:  03/05/18 98.3 F (36.8 C) (Oral)  01/15/18 97.8 F (36.6 C) (Oral)  01/09/18 98.3 F (36.8 C) (Oral)   BP Readings from Last 3 Encounters:  03/12/18 (!) 138/58  03/05/18 112/60  01/31/18 124/60   Pulse Readings from Last 3 Encounters:  03/12/18 61  03/05/18 63  01/31/18 (!) 38     Well nourished, well developed male in no acute distress. Constitutional:  oriented to person, place, and time. No distress.  Head: Normocephalic and atraumatic.  Eyes:  no discharge. No scleral icterus.  Neck: Normal range of motion. Neck supple.  Pulmonary/Chest: No audible wheezing, no distress, appears comfortable Musculoskeletal: Normal range of motion.  no  tenderness or deformity.  Neurological:   Coordination normal. Full exam not performed Skin:  No rash Psychiatric:  normal mood and affect. behavior is normal. Thought content normal.    ASSESSMENT & PLAN:    Pulmonary hypertension Previous PFTs, obstructive disease, deconditioning Recommended in addition to his Lasix and aggressive blood pressure control That we add sildenafil 20 mg 3 times daily We will try to improve his quality of life, increase exertional capacity  Coronary artery disease of native artery of native heart with stable angina  pectoris (HCC) Currently with no symptoms of angina. No further workup at this time. Continue current medication regimen.  Chronic diastolic CHF (congestive heart failure) (HCC) Continue current regiment of Lasix Will need lab work and follow-up  Type 2 diabetes mellitus with complication, without long-term current use of insulin ([place]) We have encouraged continued exercise, careful diet management in an effort to lose weight.  Mixed hyperlipidemia At goal  Essential hypertension Blood pressure appears to be relatively well controlled especially when checked in the office But runs high on his blood pressure cuff at home  Stable angina (HCC) Continue current medications  S/P CABG x 4 Prior catheterization in the 2019   CKD (chronic kidney disease) stage 3, GFR 30-59 ml/min (HCC) Needs periodic lab work for chronic renal insufficiency   COVID-19 Education: The signs and symptoms of COVID-19 were discussed with the patient and how to seek care for testing (follow up with PCP or arrange E-visit).  The importance of social distancing was discussed today.  Patient Risk:   After full review of this patients clinical status, I feel that they are at least moderate risk at this time.  Time:   Today, I have spent 25 minutes with the patient with telehealth technology discussing the cardiac and medical problems/diagnoses detailed above   10 min spent reviewing the chart  prior to patient visit today   Medication Adjustments/Labs and Tests Ordered: Current medicines are reviewed at length with the patient today.  Concerns regarding medicines are outlined above.   Tests Ordered: No tests ordered   Medication Changes: As above changes as above   Disposition: Follow-up in 3 months   Signed, Ida Rogue, MD  07/09/2018 11:36 AM    [facility] [address] [place] [id number], [place], [place] [address]

## 2018-07-09 ENCOUNTER — Telehealth (INDEPENDENT_AMBULATORY_CARE_PROVIDER_SITE_OTHER): Payer: Medicare Other | Admitting: Cardiovascular Disease

## 2018-07-09 ENCOUNTER — Ambulatory Visit: Payer: Medicare Other | Admitting: Family Medicine

## 2018-07-09 ENCOUNTER — Telehealth: Payer: Self-pay | Admitting: Cardiovascular Disease

## 2018-07-09 ENCOUNTER — Telehealth: Payer: Self-pay

## 2018-07-09 ENCOUNTER — Other Ambulatory Visit: Payer: Self-pay

## 2018-07-09 DIAGNOSIS — I10 Essential (primary) hypertension: Secondary | ICD-10-CM | POA: Diagnosis not present

## 2018-07-09 DIAGNOSIS — N183 Chronic kidney disease, stage 3 unspecified: Secondary | ICD-10-CM

## 2018-07-09 DIAGNOSIS — Z951 Presence of aortocoronary bypass graft: Secondary | ICD-10-CM

## 2018-07-09 DIAGNOSIS — R0602 Shortness of breath: Secondary | ICD-10-CM

## 2018-07-09 DIAGNOSIS — I25118 Atherosclerotic heart disease of native coronary artery with other forms of angina pectoris: Secondary | ICD-10-CM

## 2018-07-09 DIAGNOSIS — I208 Other forms of angina pectoris: Secondary | ICD-10-CM

## 2018-07-09 DIAGNOSIS — E782 Mixed hyperlipidemia: Secondary | ICD-10-CM

## 2018-07-09 DIAGNOSIS — E118 Type 2 diabetes mellitus with unspecified complications: Secondary | ICD-10-CM

## 2018-07-09 DIAGNOSIS — I5032 Chronic diastolic (congestive) heart failure: Secondary | ICD-10-CM | POA: Diagnosis not present

## 2018-07-09 MED ORDER — SILDENAFIL CITRATE 20 MG PO TABS: 20.0000 mg | ORAL_TABLET | Freq: Three times a day (TID) | ORAL | 11 refills | Status: DC

## 2018-07-09 NOTE — Patient Instructions (Addendum)
Medication Instructions:  Your physician has recommended you make the following change in your medication:  1. START Sildenafil 20 mg three times a day  If you need a refill on your cardiac medications before your next appointment, please call your pharmacy.   Lab work: No new labs needed   If you have labs (blood work) drawn today and your tests are completely normal, you will receive your results only by: Marland Kitchen MyChart Message (if you have MyChart) OR . A paper copy in the mail If you have any lab test that is abnormal or we need to change your treatment, we will call you to review the results.   Testing/Procedures: No new testing needed   Follow-Up: At [facility], you and your health needs are our priority.  As part of our continuing mission to provide you with exceptional heart care, we have created designated Provider Care Teams.  These Care Teams include your primary Cardiologist (physician) and Advanced Practice Providers (APPs -  Physician Assistants and Nurse Practitioners) who all work together to provide you with the care you need, when you need it.  . You will need a follow up appointment in 3 months .   Please call our office 2 months in advance to schedule this appointment.    . Providers on your designated Care Team:   . [name], NP . Christell Faith, PA-C . Marrianne Mood, PA-C  Any Other Special Instructions Will Be Listed Below (If Applicable).  For educational health videos Log in to : [email or web address] Or : [email or web address], password : [email or web address]

## 2018-07-09 NOTE — Telephone Encounter (Signed)
Pt states he needs a PA for Viagra

## 2018-07-09 NOTE — Telephone Encounter (Signed)
Approved today Request Reference Number: [id number].  SILDENAFIL TAB 20MG  is approved through 03/07/2019.  For further questions, call [phone].

## 2018-07-09 NOTE — Telephone Encounter (Signed)
PA started through Covermymeds.   Awaiting response.  [name] [name]  Key: Jodelle Gross  PA Case ID: [id number]  Rx #: [id number]

## 2018-07-10 NOTE — Telephone Encounter (Signed)
Spoke with patient and reviewed that he should go onto goodrx.com and print out coupon for [employer] and that he can get the medication for about $13.00 with coupon. He verbalized understanding with no further questions at this time.

## 2018-07-13 ENCOUNTER — Ambulatory Visit: Payer: Medicare Other | Admitting: Family Medicine

## 2018-07-16 ENCOUNTER — Other Ambulatory Visit: Payer: Self-pay | Admitting: Family Medicine

## 2018-07-31 ENCOUNTER — Telehealth: Payer: Self-pay | Admitting: Cardiovascular Disease

## 2018-07-31 NOTE — Telephone Encounter (Signed)
Please call to discuss Ramipril doseage, patient states he is supposed to take 3 times a day. Please advise.

## 2018-07-31 NOTE — Telephone Encounter (Signed)
Spoke with pharmacist. Patient was under the impression he was to take ramipril three times a day.  Had VV with Dr Rockey Situ on 07/09/18 and ramipril was not changed. It remained two times a day. Dr Rockey Situ did start sildenifal three times a day. Pharmacist is going to call patient to discuss and make sure he is aware of what he should be taking.

## 2018-08-07 DIAGNOSIS — I129 Hypertensive chronic kidney disease with stage 1 through stage 4 chronic kidney disease, or unspecified chronic kidney disease: Secondary | ICD-10-CM | POA: Diagnosis not present

## 2018-08-07 DIAGNOSIS — E118 Type 2 diabetes mellitus with unspecified complications: Secondary | ICD-10-CM | POA: Diagnosis not present

## 2018-08-07 DIAGNOSIS — Z951 Presence of aortocoronary bypass graft: Secondary | ICD-10-CM | POA: Diagnosis not present

## 2018-08-07 DIAGNOSIS — E1122 Type 2 diabetes mellitus with diabetic chronic kidney disease: Secondary | ICD-10-CM | POA: Diagnosis not present

## 2018-08-07 DIAGNOSIS — I2510 Atherosclerotic heart disease of native coronary artery without angina pectoris: Secondary | ICD-10-CM | POA: Diagnosis not present

## 2018-08-22 NOTE — Progress Notes (Signed)
LMOM

## 2018-08-27 ENCOUNTER — Other Ambulatory Visit: Payer: Self-pay

## 2018-08-27 ENCOUNTER — Emergency Department
Admission: EM | Admit: 2018-08-27 | Discharge: 2018-08-27 | Disposition: A | Discharge status: Home / Self Care | Payer: Medicare Other | Attending: Emergency Medicine | Admitting: Emergency Medicine

## 2018-08-27 ENCOUNTER — Ambulatory Visit: Payer: Self-pay

## 2018-08-27 DIAGNOSIS — B029 Zoster without complications: Secondary | ICD-10-CM

## 2018-08-27 DIAGNOSIS — I5032 Chronic diastolic (congestive) heart failure: Secondary | ICD-10-CM | POA: Insufficient documentation

## 2018-08-27 DIAGNOSIS — E1122 Type 2 diabetes mellitus with diabetic chronic kidney disease: Secondary | ICD-10-CM | POA: Diagnosis not present

## 2018-08-27 DIAGNOSIS — I2510 Atherosclerotic heart disease of native coronary artery without angina pectoris: Secondary | ICD-10-CM | POA: Diagnosis not present

## 2018-08-27 DIAGNOSIS — Z794 Long term (current) use of insulin: Secondary | ICD-10-CM | POA: Insufficient documentation

## 2018-08-27 DIAGNOSIS — Z79899 Other long term (current) drug therapy: Secondary | ICD-10-CM | POA: Diagnosis not present

## 2018-08-27 DIAGNOSIS — Z85828 Personal history of other malignant neoplasm of skin: Secondary | ICD-10-CM | POA: Insufficient documentation

## 2018-08-27 DIAGNOSIS — R0789 Other chest pain: Secondary | ICD-10-CM | POA: Insufficient documentation

## 2018-08-27 DIAGNOSIS — I130 Hypertensive heart and chronic kidney disease with heart failure and stage 1 through stage 4 chronic kidney disease, or unspecified chronic kidney disease: Secondary | ICD-10-CM | POA: Diagnosis not present

## 2018-08-27 DIAGNOSIS — N183 Chronic kidney disease, stage 3 (moderate): Secondary | ICD-10-CM | POA: Insufficient documentation

## 2018-08-27 DIAGNOSIS — R079 Chest pain, unspecified: Secondary | ICD-10-CM | POA: Diagnosis present

## 2018-08-27 DIAGNOSIS — Z7982 Long term (current) use of aspirin: Secondary | ICD-10-CM | POA: Diagnosis not present

## 2018-08-27 LAB — BASIC METABOLIC PANEL
Anion gap: 6 (ref 5–15)
BUN: 30 mg/dL — ABNORMAL HIGH (ref 8–23)
CO2: 23 mmol/L (ref 22–32)
Calcium: 8.3 mg/dL — ABNORMAL LOW (ref 8.9–10.3)
Chloride: 105 mmol/L (ref 98–111)
Creatinine, Ser: 1.66 mg/dL — ABNORMAL HIGH (ref 0.61–1.24)
GFR calc Af Amer: 44 mL/min — ABNORMAL LOW (ref >60)
GFR calc non Af Amer: 38 mL/min — ABNORMAL LOW (ref >60)
Glucose, Bld: 360 mg/dL — ABNORMAL HIGH (ref 70–99)
Potassium: 4.8 mmol/L (ref 3.5–5.1)
Sodium: 134 mmol/L — ABNORMAL LOW (ref 135–145)

## 2018-08-27 LAB — CBC
HCT: 35.8 % — ABNORMAL LOW (ref 39.0–52.0)
Hemoglobin: 13 g/dL (ref 13.0–17.0)
MCH: 32.5 pg (ref 26.0–34.0)
MCHC: 36.3 g/dL — ABNORMAL HIGH (ref 30.0–36.0)
MCV: 89.5 fL (ref 80.0–100.0)
Platelets: 107 10*3/uL — ABNORMAL LOW (ref 150–400)
RBC: 4 MIL/uL — ABNORMAL LOW (ref 4.22–5.81)
RDW: 11.9 % (ref 11.5–15.5)
WBC: 6.3 10*3/uL (ref 4.0–10.5)
nRBC: 0 % (ref 0.0–0.2)

## 2018-08-27 LAB — TROPONIN I: Troponin I: <0.03 ng/mL (ref <0.03)

## 2018-08-27 MED ORDER — LIDOCAINE 5 % EX PTCH: 1.0000 | MEDICATED_PATCH | Freq: Two times a day (BID) | CUTANEOUS | 1 refills | Status: DC

## 2018-08-27 MED ORDER — VALACYCLOVIR HCL 1 G PO TABS: 1000.0000 mg | ORAL_TABLET | Freq: Three times a day (TID) | ORAL | 0 refills | Status: AC

## 2018-08-27 MED ORDER — OXYCODONE-ACETAMINOPHEN 5-325 MG PO TABS: 1.0000 | ORAL_TABLET | ORAL | 0 refills | Status: DC | PRN

## 2018-08-27 MED ORDER — VALACYCLOVIR HCL 500 MG PO TABS
1000.0000 mg | ORAL_TABLET | Freq: Once | ORAL | Status: AC
  Administered 2018-08-27: 1000 mg via ORAL
  Filled 2018-08-27: qty 2

## 2018-08-27 NOTE — Telephone Encounter (Signed)
Patient called and says since last Wednesday he has been having right side chest pain in the rib area and it has progressively got worse over the weekend. He says he can feel it more when taking a deep breath and the pain is a 8-9. He denies pain radiation. He says the pain is constant all the time. Denies any other symptoms. I called the office and spoke to [place], [facility] and she put Juliann Pulse, Therapist, sports on the line. Juliann Pulse asked to speak to the patient, the call was connected successfully.  Answer Assessment - Initial Assessment Questions 1. LOCATION: "Where does it hurt?"       Right rib pain 2. RADIATION: "Does the pain go anywhere else?" (e.g., into neck, jaw, arms, back)     No 3. ONSET: "When did the chest pain begin?" (Minutes, hours or days)      Wednesday of last week, progressively worse 4. PATTERN "Does the pain come and go, or has it been constant since it started?"  "Does it get worse with exertion?"      Constant; don't exert much 5. DURATION: "How long does it last" (e.g., seconds, minutes, hours)     Constant all the time 6. SEVERITY: "How bad is the pain?"  (e.g., Scale 1-10; mild, moderate, or severe)    - MILD (1-3): doesn't interfere with normal activities     - MODERATE (4-7): interferes with normal activities or awakens from sleep    - SEVERE (8-10): excruciating pain, unable to do any normal activities       8-9 7. CARDIAC RISK FACTORS: "Do you have any history of heart problems or risk factors for heart disease?" (e.g., prior heart attack, angina; high blood pressure, diabetes, being overweight, high cholesterol, smoking, or strong family history of heart disease)     Yes, bypass surgery and stents 8. PULMONARY RISK FACTORS: "Do you have any history of lung disease?"  (e.g., blood clots in lung, asthma, emphysema, birth control pills)     No 9. CAUSE: "What do you think is causing the chest pain?"     No idea 10. OTHER SYMPTOMS: "Do you have any other symptoms?" (e.g., dizziness,  nausea, vomiting, sweating, fever, difficulty breathing, cough)       No 11. PREGNANCY: "Is there any chance you are pregnant?" "When was your last menstrual period?"       N/A  Protocols used: CHEST PAIN-A-AH

## 2018-08-27 NOTE — Telephone Encounter (Signed)
With pain of 8-9 on pain scale and with pain worsening with deep breath, spoke with PCP and recommended ER versus UC due to extensive HX and worsening pain with Deep breath. Patient stated he would get dressed and have wife drive him to ER.

## 2018-08-27 NOTE — Discharge Instructions (Addendum)
It looks like you have shingles.  Please return for worsening pain, fever, if the rash spreads, you get short of breath or if you feel sicker in any way.  Please take the valacyclovir 1 pill 3 times a day for a week.  That should treat the shingles.  You can take the Percocet 1 pill 4 times a day for for pain.  Be careful the Percocet can make you constipated and woozy.  Do not fall.  Do not drive on the Percocet.  I will also give you some lidocaine patches which you can cut and put on your skin over the rash.  It would be best not to put them on any open sores.  Do not put more than 1 patch on your skin at a time.

## 2018-08-27 NOTE — ED Triage Notes (Signed)
Pt c/o right sided burning chest pain, hurts to take a deep breath since last Wednesday or Thursday while [place] was [place] the beach, states he was getting some relief with tylenol

## 2018-08-27 NOTE — ED Provider Notes (Signed)
[facility] Emergency Department Provider Note   ____________________________________________   First MD Initiated Contact with Patient 08/27/18 1303     (approximate)  I have reviewed the triage vital signs and the nursing notes.   HISTORY  Chief Complaint Chest Pain   HPI [name] [name] is a 85 y.o. male reports that Wednesday or Thursday [place] the beach [place] developed pain radiating around the bottom of his ribs on the right side that is worse with touching or deep breathing.  He had something like this about a year ago after he got the shingles shot and he was told it might be shingles that just did not come out to give him a rash.  He went away with treatment.  On examination of him now he has the beginnings of what look like shingles coming up both on his back and in the front under his right breast in the same dermatome.  He is not short of breath.  He is not coughing.  He is not running a fever.  He does not have any pain with exertion.         Past Medical History:  Diagnosis Date  . BPH (benign prostatic hyperplasia)   . Cancer ([place])    skin  . Cataract   . Coronary artery disease    a. 4v CABG 01/2005; b. cath 06/05/13 showed patent grafts: LIMA-LAD, VG-D, VG-OM3, VG-dRCA, nl filling pressures, nl LV fxn  . Diastolic dysfunction    a. echo 07/9739: nl systolic fxn, mild LVH, diastolic relaxation abnormality, mildly enlarged LA, mild Ao insufficiency  . Dyspnea    with exertion  . ED (erectile dysfunction)   . GERD (gastroesophageal reflux disease)   . Hyperlipidemia   . Hypertension   . IDDM (insulin dependent diabetes mellitus) ([place]) 2000  . Osteoarthritis   . Perirectal fistula   . Pneumonia 12/2016  . Tubular adenoma of colon 07/2012  . Vertigo     Patient Active Problem List   Diagnosis Date Noted  . Stable angina ([place]) 03/12/2018  . Chronic low back pain 03/05/2018  . Skin tear of hand without complication, initial  encounter 03/05/2018  . Abnormal breast exam 01/18/2018  . PAC (premature atrial contraction) 01/18/2018  . Neuropathic pain 01/18/2018  . Demand ischemia ([place])   . Acute on chronic kidney failure ([place]) 01/07/2018  . Accelerated hypertension 01/07/2018  . Bell's palsy 08/18/2017  . H/O cold sores 08/18/2017  . Bronchitis 11/29/2016  . Irregular heart beat 11/29/2016  . Insomnia 11/10/2016  . PLMD (periodic limb movement disorder) 08/17/2016  . Mild OSA 08/17/2016  . Intolerance of continuous positive airway pressure (CPAP) ventilation 08/17/2016  . Chronic arthritis 06/02/2016  . CKD (chronic kidney disease) stage 3, GFR 30-59 ml/min (HCC) 06/02/2016  . Unstable angina ([place]) 09/24/2015  . S/P CABG x 4   . Type 2 diabetes mellitus with complication, without long-term current use of insulin ([place]) 02/06/2015  . Chronic diastolic CHF (congestive heart failure) ([place]) 01/01/2014  . CAD (coronary artery disease) 06/03/2013  . BPH (benign prostatic hyperplasia)   . GERD (gastroesophageal reflux disease)   . Hyperlipidemia 09/15/2009  . Essential hypertension 09/15/2009    Past Surgical History:  Procedure Laterality Date  . BACK SURGERY    . CARDIAC CATHETERIZATION  06/05/2013   [facility]   Marland Kitchen CARDIAC CATHETERIZATION N/A 09/24/2015   Procedure: Right Heart Cath and Coronary/Graft Angiography;  Surgeon: Minna Merritts, MD;  Location: [place] [facility];  Service: Cardiovascular;  Laterality: N/A;  . CATARACT EXTRACTION  sept 2013   right  . CATARACT EXTRACTION W/PHACO Left 03/28/2017   Procedure: CATARACT EXTRACTION PHACO AND INTRAOCULAR LENS PLACEMENT (IOC);  Surgeon: Birder Robson, MD;  Location: [facility] ORS;  Service: Ophthalmology;  Laterality: Left;  [place] 00:42.0 AP% 15.0 CDE 6.31 Fluid Pack lot # [id number]  . CHOLECYSTECTOMY  05/15/2011   Procedure: LAPAROSCOPIC CHOLECYSTECTOMY;  Surgeon: Rolm Bookbinder, MD;  Location: [place];  Service: General;  Laterality: N/A;  .  COLONOSCOPY W/ POLYPECTOMY    . CORONARY ARTERY BYPASS GRAFT  2007   x 4  . EYE SURGERY    . FOOT SURGERY  2012   right foot  . JOINT REPLACEMENT  8/10   Right THR--[name]  . LEFT AND RIGHT HEART CATHETERIZATION WITH CORONARY/GRAFT ANGIOGRAM N/A 06/05/2013   Procedure: LEFT AND RIGHT HEART CATHETERIZATION WITH Beatrix Fetters;  Surgeon: Peter M Martinique, MD;  Location: [facility] C[facility] LAB;  Service: Cardiovascular;  Laterality: N/A;  . LUMBAR LAMINECTOMY  1989  . Prostate photovaporization  5/16   Dr Budd Palmer  . RIGHT/LEFT HEART CATH AND CORONARY/GRAFT ANGIOGRAPHY N/A 01/05/2018   Procedure: RIGHT/LEFT HEART CATH AND CORONARY/GRAFT ANGIOGRAPHY;  Surgeon: Minna Merritts, MD;  Location: [place] [facility];  Service: Cardiovascular;  Laterality: N/A;    Prior to Admission medications   Medication Sig Start Date End Date Taking? Authorizing Provider  acetaminophen (TYLENOL) 500 MG tablet Take 1,000 mg by mouth 2 (two) times daily.    [provider]  amLODipine (NORVASC) 5 MG tablet Take 1 tablet (5 mg total) by mouth 2 (two) times daily. 01/09/18   Fritzi Mandes, MD  aspirin EC 81 MG tablet Take 81 mg by mouth daily.    [provider]  BD INSULIN SYRINGE ULTRAFINE 31G X 15/64" 0.3 ML MISC USE AS DIRECTED 07/31/15   Viviana Simpler I, MD  blood glucose meter kit and supplies KIT OneTouch Verio w/ Device Kit OneTouch Verio VI STRP OneTouch Delica Lancet Dev misc OneTouch Delica Lancets 31R or Fine  Please check CBGs fasting and 2 other times during the day  Dx Code E11.8 08/18/17   [name], MD  carvedilol (COREG) 12.5 MG tablet TAKE 1 TABLET BY MOUTH TWICE DAILY 03/28/18   Minna Merritts, MD  finasteride (PROSCAR) 5 MG tablet Take 1 tablet (5 mg total) by mouth daily. Patient taking differently: Take 5 mg by mouth at bedtime.  04/16/12   Venia Carbon, MD  furosemide (LASIX) 20 MG tablet Take 1 tablet (20 mg) by mouth twice daily 6 days a week 03/12/18    Minna Merritts, MD  gabapentin (NEURONTIN) 300 MG capsule Take 1 capsule (300 mg total) by mouth at bedtime. 12/26/17   McLean-Scocuzza, Nino Glow, MD  Insulin Pen Needle (BD PEN NEEDLE NANO U/F) 32G X 4 MM MISC USE AS DIRECTED 09/06/17   [name], MD  omeprazole (PRILOSEC) 20 MG capsule TAKE 1 CAPSULE BY MOUTH TWICE DAILY 07/16/18   [name], MD  potassium chloride (K-DUR) 10 MEQ tablet Take 1 tablet (10 meq) by mouth twice daily 6 days a week 03/12/18   Minna Merritts, MD  ramipril (ALTACE) 10 MG capsule Take 1 capsule (10 mg total) by mouth 2 (two) times daily. 01/05/18   Minna Merritts, MD  rosuvastatin (CRESTOR) 20 MG tablet Take 1 tablet (20 mg total) by mouth daily. 01/09/18  Fritzi Mandes, MD  sildenafil (REVATIO) 20 MG tablet Take 1 tablet (20 mg total) by mouth 3 (three) times daily. 07/09/18   Gollan, Kathlene November, MD  TOUJEO SOLOSTAR 300 UNIT/ML SOPN INJECT 50-60 UNITS INTO THE SKIN DAILY .INCREASE BY 1 UNITS/DAY UNTILFBS ARE BETWEEN 80-130. DO NOT EXCEED 60 UNITS PER DAY. Patient taking differently: Inject 46-48 Units into the skin daily.  09/21/17   [name], MD  traMADol (ULTRAM) 50 MG tablet Take 1 tablet (50 mg total) by mouth 2 (two) times daily as needed. Patient taking differently: Take 50 mg by mouth 2 (two) times daily as needed (for pain.).  12/26/17   McLean-Scocuzza, Nino Glow, MD  traZODone (DESYREL) 50 MG tablet TAKE ONE TO TWO TABLETS BY MOUTH AT BEDTIME AS NEEDED FOR SLEEP 02/15/18   [name], MD  vitamin B-12 (CYANOCOBALAMIN) 50 MCG tablet Take 50 mcg by mouth daily.    [provider]  Vitamin D, Cholecalciferol, 1000 units TABS Take 1,000 Units by mouth daily.     [provider]    Allergies Cortisone, Metformin, and Metformin and related  Family History  Problem Relation Age of Onset  . Lung cancer Mother   . Heart disease Father   . Colon cancer Neg Hx   . Breast cancer Neg Hx     Social History Social  History   Tobacco Use  . Smoking status: Never Smoker  . Smokeless tobacco: Never Used  Substance Use Topics  . Alcohol use: Yes    Comment: occ cocktail  . Drug use: No    Review of Systems  Constitutional: No fever/chills Eyes: No visual changes. ENT: No sore throat. Cardiovascular: See HPI Respiratory: Denies shortness of breath. Gastrointestinal: No abdominal pain.  No nausea, no vomiting.  No diarrhea.  No constipation. Genitourinary: Negative for dysuria. Musculoskeletal: Negative for back pain. Skin: Negative for rash. Neurological: Negative for headaches, focal weakness or  ____________________________________________   PHYSICAL EXAM:  VITAL SIGNS: ED Triage Vitals  Enc Vitals Group     BP 08/27/18 1209 (!) 113/40     Pulse Rate 08/27/18 1209 (!) 53     Resp 08/27/18 1209 18     Temp 08/27/18 1209 97.7 F (36.5 C)     Temp Source 08/27/18 1209 Oral     SpO2 08/27/18 1209 100 %     Weight 08/27/18 1204 212 lb (96.2 kg)     Height 08/27/18 1204 6' 2"  (1.88 m)     Head Circumference --      Peak Flow --      Pain Score 08/27/18 1210 8     Pain Loc --      Pain Edu? --      Excl. in [place]? --     Constitutional: Alert and oriented. Well appearing and in no acute distress. Eyes: Conjunctivae are normal.  Head: Atraumatic. Nose: No congestion/rhinnorhea. Mouth/Throat: Mucous membranes are moist.  Oropharynx non-erythematous. Neck: No stridor. Cardiovascular: Normal rate, regular rhythm. Grossly normal heart sounds.  Good peripheral circulation. Respiratory: Normal respiratory effort.  No retractions. Lungs CTAB. Gastrointestinal: Soft and nontender. No distention. No abdominal bruits. No CVA tenderness. Musculoskeletal: No lower extremity tenderness nor edema.   Neurologic:  Normal speech and language. No gross focal neurologic deficits are appreciated. No gait instability. Skin:  Skin is warm, dry and intact.  Rash as described in HPI.  The area in the  dermatome is exquisitely tender to light touch. Psychiatric: Mood  and affect are normal. Speech and behavior are normal.  ____________________________________________   LABS (all labs ordered are listed, but only abnormal results are displayed)  Labs Reviewed  BASIC METABOLIC PANEL - Abnormal; Notable for the following components:      Result Value   Sodium 134 (*)    Glucose, Bld 360 (*)    BUN 30 (*)    Creatinine, Ser 1.66 (*)    Calcium 8.3 (*)    GFR calc non Af Amer 38 (*)    GFR calc Af Amer 44 (*)    All other components within normal limits  CBC - Abnormal; Notable for the following components:   RBC 4.00 (*)    HCT 35.8 (*)    MCHC 36.3 (*)    Platelets 107 (*)    All other components within normal limits  TROPONIN I   ____________________________________________  EKG  AG read interpreted by me shows sinus bradycardia rate of 52 normal axis no acute changes ____________________________________________  RADIOLOGY  ED MD interpretation:   Official radiology report(s): No results found.  ____________________________________________   PROCEDURES  Procedure(s) performed (including Critical Care):  Procedures   ____________________________________________   INITIAL IMPRESSION / ASSESSMENT AND PLAN / ED COURSE  Patient with exquisite tenderness on touching his skin which have only seen with shingles and a rash that is consistent with shingles in one dermatome I will treat him for shingles his EKG looks okay his troponin is negative his white count is good I will not give him steroids as that is not in the newest recommendation and up-to-date.  Give him valacyclovir thousand 3 times a day for a week.  Hopefully this will help him I will also give him some Percocets as he is having a lot of pain and trouble sleeping.              ____________________________________________   FINAL CLINICAL IMPRESSION(S) / ED DIAGNOSES  Final diagnoses:  Herpes  zoster without complication     ED Discharge Orders    None       Note:  This document was prepared using Dragon voice recognition software and may include unintentional dictation errors.    Nena Polio, MD 08/27/18 1321

## 2018-08-28 NOTE — Telephone Encounter (Signed)
Noted.  Patient was diagnosed with shingles.  Please touch base with the patient to offer him a follow-up visit sometime in the next week to determine if he is improving.  Thanks.

## 2018-08-30 DIAGNOSIS — H2640 Unspecified secondary cataract: Secondary | ICD-10-CM | POA: Diagnosis not present

## 2018-08-30 DIAGNOSIS — E113393 Type 2 diabetes mellitus with moderate nonproliferative diabetic retinopathy without macular edema, bilateral: Secondary | ICD-10-CM | POA: Diagnosis not present

## 2018-08-30 LAB — HM DIABETES EYE EXAM

## 2018-08-31 ENCOUNTER — Other Ambulatory Visit: Payer: Self-pay | Admitting: Cardiovascular Disease

## 2018-08-31 NOTE — Telephone Encounter (Signed)
Called to see how the patient was doing after DX of shingles, patients states " it is very uncomfortable", an appointment was scheduled for next Tuesday to f/up with the patient.  Marcell Pfeifer,cma

## 2018-08-31 NOTE — Telephone Encounter (Signed)
I spoke with pt this afternoon pt is taking Amlodipine 5 mg tablet bid. Please advise if ok to refill.

## 2018-08-31 NOTE — Telephone Encounter (Signed)
Amlodipine 5mg  bid is listed on the pt most recent telemedicine visit with Dr. Rockey Situ. Medication refilled.

## 2018-09-04 ENCOUNTER — Ambulatory Visit (INDEPENDENT_AMBULATORY_CARE_PROVIDER_SITE_OTHER): Payer: Medicare Other | Admitting: Family Medicine

## 2018-09-04 ENCOUNTER — Other Ambulatory Visit: Payer: Self-pay

## 2018-09-04 DIAGNOSIS — B029 Zoster without complications: Secondary | ICD-10-CM

## 2018-09-04 DIAGNOSIS — M792 Neuralgia and neuritis, unspecified: Secondary | ICD-10-CM

## 2018-09-04 HISTORY — DX: Zoster without complications: B02.9

## 2018-09-04 MED ORDER — OXYCODONE-ACETAMINOPHEN 5-325 MG PO TABS: 1.0000 | ORAL_TABLET | Freq: Four times a day (QID) | ORAL | 0 refills | Status: DC | PRN

## 2018-09-04 MED ORDER — GABAPENTIN 100 MG PO CAPS: 100.0000 mg | ORAL_CAPSULE | Freq: Three times a day (TID) | ORAL | 1 refills | Status: DC

## 2018-09-04 NOTE — Progress Notes (Signed)
Virtual Visit via video Note  This visit type was conducted due to national recommendations for restrictions regarding the COVID-19 pandemic (e.g. social distancing).  This format is felt to be most appropriate for this patient at this time.  All issues noted in this document were discussed and addressed.  No physical exam was performed (except for noted visual exam findings with Video Visits).   I connected with [name] [name] today at 10:00 AM EDT by a video enabled telemedicine application and verified that I am speaking with the correct person using two identifiers. Location patient: home Location provider: Home office  Persons participating in the virtual visit: patient, provider  I discussed the limitations, risks, security and privacy concerns of performing an evaluation and management service by telephone and the availability of in person appointments. I also discussed with the patient that there may be a patient responsible charge related to this service. The patient expressed understanding and agreed to proceed.   Reason for visit: follow-up  HPI: Shingles: Patient was evaluated in the ED and diagnosed with shingles.  He has a rash over his right flank with discomfort that he describes as bees continuously stinging him.  He continues to have pain.  He does note some of the lesions over his back are starting to crust up.  He is keeping it covered with a shirt.  He is taking Valtrex.  The Percocet was helpful for pain though he ran out yesterday and has tried Tylenol and ibuprofen with no benefit.  Lidocaine patches have been beneficial.   ROS: See pertinent positives and negatives per HPI.  Past Medical History:  Diagnosis Date  . BPH (benign prostatic hyperplasia)   . Cancer ([place])    skin  . Cataract   . Coronary artery disease    a. 4v CABG 01/2005; b. cath 06/05/13 showed patent grafts: LIMA-LAD, VG-D, VG-OM3, VG-dRCA, nl filling pressures, nl LV fxn  . Diastolic dysfunction     a. echo 06/4626: nl systolic fxn, mild LVH, diastolic relaxation abnormality, mildly enlarged LA, mild Ao insufficiency  . Dyspnea    with exertion  . ED (erectile dysfunction)   . GERD (gastroesophageal reflux disease)   . Hyperlipidemia   . Hypertension   . IDDM (insulin dependent diabetes mellitus) ([place]) 2000  . Osteoarthritis   . Perirectal fistula   . Pneumonia 12/2016  . Tubular adenoma of colon 07/2012  . Vertigo     Past Surgical History:  Procedure Laterality Date  . BACK SURGERY    . CARDIAC CATHETERIZATION  06/05/2013   [facility].   Marland Kitchen CARDIAC CATHETERIZATION N/A 09/24/2015   Procedure: Right Heart Cath and Coronary/Graft Angiography;  Surgeon: Minna Merritts, MD;  Location: [place] [facility];  Service: Cardiovascular;  Laterality: N/A;  . CATARACT EXTRACTION  sept 2013   right  . CATARACT EXTRACTION W/PHACO Left 03/28/2017   Procedure: CATARACT EXTRACTION PHACO AND INTRAOCULAR LENS PLACEMENT (IOC);  Surgeon: Birder Robson, MD;  Location: [facility] ORS;  Service: Ophthalmology;  Laterality: Left;  [place] 00:42.0 AP% 15.0 CDE 6.31 Fluid Pack lot # [id number]  . CHOLECYSTECTOMY  05/15/2011   Procedure: LAPAROSCOPIC CHOLECYSTECTOMY;  Surgeon: Rolm Bookbinder, MD;  Location: [place];  Service: General;  Laterality: N/A;  . COLONOSCOPY W/ POLYPECTOMY    . CORONARY ARTERY BYPASS GRAFT  2007   x 4  . EYE SURGERY    . FOOT SURGERY  2012   right foot  . JOINT REPLACEMENT  8/10  Right THR--[name]  . LEFT AND RIGHT HEART CATHETERIZATION WITH CORONARY/GRAFT ANGIOGRAM N/A 06/05/2013   Procedure: LEFT AND RIGHT HEART CATHETERIZATION WITH Beatrix Fetters;  Surgeon: Peter M Martinique, MD;  Location: [facility] CATH LAB;  Service: Cardiovascular;  Laterality: N/A;  . LUMBAR LAMINECTOMY  1989  . Prostate photovaporization  5/16   Dr Budd Palmer  . RIGHT/LEFT HEART CATH AND CORONARY/GRAFT ANGIOGRAPHY N/A 01/05/2018   Procedure: RIGHT/LEFT HEART CATH AND CORONARY/GRAFT ANGIOGRAPHY;   Surgeon: Minna Merritts, MD;  Location: [place] CV LAB;  Service: Cardiovascular;  Laterality: N/A;    Family History  Problem Relation Age of Onset  . Lung cancer Mother   . Heart disease Father   . Colon cancer Neg Hx   . Breast cancer Neg Hx     SOCIAL HX: Non-smoker   Current Outpatient Medications:  .  acetaminophen (TYLENOL) 500 MG tablet, Take 1,000 mg by mouth 2 (two) times daily., Disp: , Rfl:  .  amLODipine (NORVASC) 5 MG tablet, TAKE ONE TABLET TWICE DAILY, Disp: 90 tablet, Rfl: 0 .  aspirin EC 81 MG tablet, Take 81 mg by mouth daily., Disp: , Rfl:  .  BD INSULIN SYRINGE ULTRAFINE 31G X 15/64" 0.3 ML MISC, USE AS DIRECTED, Disp: 100 each, Rfl: 3 .  blood glucose meter kit and supplies KIT, OneTouch Verio w/ Device Kit OneTouch Verio VI STRP OneTouch Delica Lancet Dev misc OneTouch Delica Lancets 17C or Fine  Please check CBGs fasting and 2 other times during the day  Dx Code E11.8, Disp: 1 each, Rfl: 0 .  carvedilol (COREG) 12.5 MG tablet, TAKE 1 TABLET BY MOUTH TWICE DAILY, Disp: 180 tablet, Rfl: 4 .  finasteride (PROSCAR) 5 MG tablet, Take 1 tablet (5 mg total) by mouth daily. (Patient taking differently: Take 5 mg by mouth at bedtime. ), Disp: 90 tablet, Rfl: 1 .  furosemide (LASIX) 20 MG tablet, Take 1 tablet (20 mg) by mouth twice daily 6 days a week, Disp: , Rfl:  .  gabapentin (NEURONTIN) 100 MG capsule, Take 1 capsule (100 mg total) by mouth 3 (three) times daily., Disp: 90 capsule, Rfl: 1 .  Insulin Pen Needle (BD PEN NEEDLE NANO U/F) 32G X 4 MM MISC, USE AS DIRECTED, Disp: 100 each, Rfl: 11 .  lidocaine (LIDODERM) 5 %, Place 1 patch onto the skin every 12 (twelve) hours. Remove & Discard patch within 12 hours or as directed by MD, Disp: 10 patch, Rfl: 1 .  omeprazole (PRILOSEC) 20 MG capsule, TAKE 1 CAPSULE BY MOUTH TWICE DAILY, Disp: 60 capsule, Rfl: 2 .  oxyCODONE-acetaminophen (PERCOCET) 5-325 MG tablet, Take 1 tablet by mouth every 6 (six) hours as needed  for severe pain., Disp: 20 tablet, Rfl: 0 .  potassium chloride (K-DUR) 10 MEQ tablet, Take 1 tablet (10 meq) by mouth twice daily 6 days a week, Disp: , Rfl:  .  ramipril (ALTACE) 10 MG capsule, Take 1 capsule (10 mg total) by mouth 2 (two) times daily., Disp: 180 capsule, Rfl: 4 .  rosuvastatin (CRESTOR) 20 MG tablet, Take 1 tablet (20 mg total) by mouth daily., Disp: 90 tablet, Rfl: 4 .  sildenafil (REVATIO) 20 MG tablet, Take 1 tablet (20 mg total) by mouth 3 (three) times daily., Disp: 90 tablet, Rfl: 11 .  TOUJEO SOLOSTAR 300 UNIT/ML SOPN, INJECT 50-60 UNITS INTO THE SKIN DAILY .INCREASE BY 1 UNITS/DAY UNTILFBS ARE BETWEEN 80-130. DO NOT EXCEED 60 UNITS PER DAY. (Patient taking differently: Inject 46-48 Units into  the skin daily. ), Disp: 4.5 mL, Rfl: 6 .  traZODone (DESYREL) 50 MG tablet, TAKE ONE TO TWO TABLETS BY MOUTH AT BEDTIME AS NEEDED FOR SLEEP, Disp: 90 tablet, Rfl: 3 .  vitamin B-12 (CYANOCOBALAMIN) 50 MCG tablet, Take 50 mcg by mouth daily., Disp: , Rfl:  .  Vitamin D, Cholecalciferol, 1000 units TABS, Take 1,000 Units by mouth daily. , Disp: , Rfl:   EXAM:  VITALS per patient if applicable: None.  GENERAL: alert, oriented, appears well and in no acute distress  HEENT: atraumatic, conjunttiva clear, no obvious abnormalities on inspection of external nose and ears  NECK: normal movements of the head and neck  LUNGS: on inspection no signs of respiratory distress, breathing rate appears normal, no obvious gross SOB, gasping or wheezing  CV: no obvious cyanosis  MS: moves all visible extremities without noticeable abnormality  Skin: Erythematous papular rash over right flank, anterior right mid ribs, and posterior right mid ribs  PSYCH/NEURO: pleasant and cooperative, no obvious depression or anxiety, speech and thought processing grossly intact  ASSESSMENT AND PLAN:  Discussed the following assessment and plan:  Shingles This is consistent with shingles.  Continues  to have some pain.  He will finish his Valtrex.  I will refill his Percocet.  Controlled substance database reviewed.  We will start him on gabapentin at a low dose.  Discussed risk of drowsiness with gabapentin.  Discussed risk of respiratory depression and addiction with Percocet.  Advised that the Percocet would be a short-term treatment.  Discussed that he needs to keep the area covered and he needs to avoid anyone that has not had chickenpox, any pregnant women, and any immunocompromised people.  He will contact us in 1 to 2 weeks if not progressively improving.    I discussed the assessment and treatment plan with the patient. The patient was provided an opportunity to ask questions and all were answered. The patient agreed with the plan and demonstrated an understanding of the instructions.   The patient was advised to call back or seek an in-person evaluation if the symptoms worsen or if the condition fails to improve as anticipated.   [name], MD

## 2018-09-04 NOTE — Assessment & Plan Note (Signed)
This is consistent with shingles.  Continues to have some pain.  He will finish his Valtrex.  I will refill his Percocet.  Controlled substance database reviewed.  We will start him on gabapentin at a low dose.  Discussed risk of drowsiness with gabapentin.  Discussed risk of respiratory depression and addiction with Percocet.  Advised that the Percocet would be a short-term treatment.  Discussed that he needs to keep the area covered and he needs to avoid anyone that has not had chickenpox, any pregnant women, and any immunocompromised people.  He will contact us in 1 to 2 weeks if not progressively improving.

## 2018-09-07 ENCOUNTER — Ambulatory Visit: Payer: Self-pay | Admitting: Family Medicine

## 2018-09-07 ENCOUNTER — Observation Stay
Admission: EM | Admit: 2018-09-07 | Discharge: 2018-09-08 | Disposition: A | Discharge status: Home / Self Care | Payer: Medicare Other | Attending: Internal Medicine | Admitting: Internal Medicine

## 2018-09-07 ENCOUNTER — Emergency Department: Payer: Medicare Other

## 2018-09-07 ENCOUNTER — Encounter: Payer: Self-pay | Admitting: Emergency Medicine

## 2018-09-07 ENCOUNTER — Other Ambulatory Visit: Payer: Self-pay

## 2018-09-07 DIAGNOSIS — M545 Low back pain: Secondary | ICD-10-CM | POA: Insufficient documentation

## 2018-09-07 DIAGNOSIS — I700 Atherosclerosis of aorta: Secondary | ICD-10-CM | POA: Insufficient documentation

## 2018-09-07 DIAGNOSIS — G8929 Other chronic pain: Secondary | ICD-10-CM | POA: Insufficient documentation

## 2018-09-07 DIAGNOSIS — R112 Nausea with vomiting, unspecified: Secondary | ICD-10-CM

## 2018-09-07 DIAGNOSIS — Z951 Presence of aortocoronary bypass graft: Secondary | ICD-10-CM | POA: Insufficient documentation

## 2018-09-07 DIAGNOSIS — G4761 Periodic limb movement disorder: Secondary | ICD-10-CM | POA: Insufficient documentation

## 2018-09-07 DIAGNOSIS — Z03818 Encounter for observation for suspected exposure to other biological agents ruled out: Secondary | ICD-10-CM | POA: Diagnosis not present

## 2018-09-07 DIAGNOSIS — Z96641 Presence of right artificial hip joint: Secondary | ICD-10-CM | POA: Diagnosis not present

## 2018-09-07 DIAGNOSIS — B029 Zoster without complications: Secondary | ICD-10-CM | POA: Insufficient documentation

## 2018-09-07 DIAGNOSIS — R1032 Left lower quadrant pain: Secondary | ICD-10-CM

## 2018-09-07 DIAGNOSIS — I5032 Chronic diastolic (congestive) heart failure: Secondary | ICD-10-CM | POA: Insufficient documentation

## 2018-09-07 DIAGNOSIS — R109 Unspecified abdominal pain: Secondary | ICD-10-CM | POA: Diagnosis not present

## 2018-09-07 DIAGNOSIS — N183 Chronic kidney disease, stage 3 (moderate): Secondary | ICD-10-CM | POA: Insufficient documentation

## 2018-09-07 DIAGNOSIS — E785 Hyperlipidemia, unspecified: Secondary | ICD-10-CM | POA: Insufficient documentation

## 2018-09-07 DIAGNOSIS — N179 Acute kidney failure, unspecified: Secondary | ICD-10-CM | POA: Diagnosis not present

## 2018-09-07 DIAGNOSIS — Z794 Long term (current) use of insulin: Secondary | ICD-10-CM | POA: Insufficient documentation

## 2018-09-07 DIAGNOSIS — E1122 Type 2 diabetes mellitus with diabetic chronic kidney disease: Secondary | ICD-10-CM | POA: Diagnosis not present

## 2018-09-07 DIAGNOSIS — G4733 Obstructive sleep apnea (adult) (pediatric): Secondary | ICD-10-CM | POA: Insufficient documentation

## 2018-09-07 DIAGNOSIS — G4700 Insomnia, unspecified: Secondary | ICD-10-CM | POA: Diagnosis not present

## 2018-09-07 DIAGNOSIS — K219 Gastro-esophageal reflux disease without esophagitis: Secondary | ICD-10-CM | POA: Diagnosis not present

## 2018-09-07 DIAGNOSIS — I130 Hypertensive heart and chronic kidney disease with heart failure and stage 1 through stage 4 chronic kidney disease, or unspecified chronic kidney disease: Secondary | ICD-10-CM | POA: Diagnosis not present

## 2018-09-07 DIAGNOSIS — E860 Dehydration: Secondary | ICD-10-CM | POA: Diagnosis not present

## 2018-09-07 DIAGNOSIS — I2510 Atherosclerotic heart disease of native coronary artery without angina pectoris: Secondary | ICD-10-CM | POA: Insufficient documentation

## 2018-09-07 DIAGNOSIS — Z79899 Other long term (current) drug therapy: Secondary | ICD-10-CM | POA: Diagnosis not present

## 2018-09-07 DIAGNOSIS — M1990 Unspecified osteoarthritis, unspecified site: Secondary | ICD-10-CM | POA: Insufficient documentation

## 2018-09-07 DIAGNOSIS — Z7982 Long term (current) use of aspirin: Secondary | ICD-10-CM | POA: Insufficient documentation

## 2018-09-07 DIAGNOSIS — K529 Noninfective gastroenteritis and colitis, unspecified: Secondary | ICD-10-CM | POA: Diagnosis not present

## 2018-09-07 DIAGNOSIS — N400 Enlarged prostate without lower urinary tract symptoms: Secondary | ICD-10-CM | POA: Diagnosis not present

## 2018-09-07 DIAGNOSIS — R1110 Vomiting, unspecified: Secondary | ICD-10-CM | POA: Diagnosis not present

## 2018-09-07 DIAGNOSIS — Z1159 Encounter for screening for other viral diseases: Secondary | ICD-10-CM | POA: Insufficient documentation

## 2018-09-07 DIAGNOSIS — I491 Atrial premature depolarization: Secondary | ICD-10-CM | POA: Insufficient documentation

## 2018-09-07 LAB — COMPREHENSIVE METABOLIC PANEL
ALT: 36 U/L (ref 0–44)
AST: 27 U/L (ref 15–41)
Albumin: 4.2 g/dL (ref 3.5–5.0)
Alkaline Phosphatase: 145 U/L — ABNORMAL HIGH (ref 38–126)
Anion gap: 8 (ref 5–15)
BUN: 48 mg/dL — ABNORMAL HIGH (ref 8–23)
CO2: 20 mmol/L — ABNORMAL LOW (ref 22–32)
Calcium: 9.1 mg/dL (ref 8.9–10.3)
Chloride: 109 mmol/L (ref 98–111)
Creatinine, Ser: 1.82 mg/dL — ABNORMAL HIGH (ref 0.61–1.24)
GFR calc Af Amer: 39 mL/min — ABNORMAL LOW (ref >60)
GFR calc non Af Amer: 34 mL/min — ABNORMAL LOW (ref >60)
Glucose, Bld: 163 mg/dL — ABNORMAL HIGH (ref 70–99)
Potassium: 5.1 mmol/L (ref 3.5–5.1)
Sodium: 137 mmol/L (ref 135–145)
Total Bilirubin: 1.1 mg/dL (ref 0.3–1.2)
Total Protein: 6.8 g/dL (ref 6.5–8.1)

## 2018-09-07 LAB — CBC
HCT: 39 % (ref 39.0–52.0)
Hemoglobin: 14.1 g/dL (ref 13.0–17.0)
MCH: 32.9 pg (ref 26.0–34.0)
MCHC: 36.2 g/dL — ABNORMAL HIGH (ref 30.0–36.0)
MCV: 90.9 fL (ref 80.0–100.0)
Platelets: 128 10*3/uL — ABNORMAL LOW (ref 150–400)
RBC: 4.29 MIL/uL (ref 4.22–5.81)
RDW: 12.4 % (ref 11.5–15.5)
WBC: 14.9 10*3/uL — ABNORMAL HIGH (ref 4.0–10.5)
nRBC: 0 % (ref 0.0–0.2)

## 2018-09-07 LAB — HEMOGLOBIN A1C
Hgb A1c MFr Bld: 7.2 % — ABNORMAL HIGH (ref 4.8–5.6)
Mean Plasma Glucose: 159.94 mg/dL

## 2018-09-07 LAB — LIPASE, BLOOD: Lipase: 28 U/L (ref 11–51)

## 2018-09-07 MED ORDER — AMLODIPINE BESYLATE 5 MG PO TABS
5.0000 mg | ORAL_TABLET | Freq: Two times a day (BID) | ORAL | Status: DC
  Administered 2018-09-07: 5 mg via ORAL
  Filled 2018-09-07 (×2): qty 1

## 2018-09-07 MED ORDER — GABAPENTIN 100 MG PO CAPS: 100.0000 mg | ORAL_CAPSULE | Freq: Three times a day (TID) | ORAL | Status: DC

## 2018-09-07 MED ORDER — POTASSIUM CHLORIDE CRYS ER 10 MEQ PO TBCR
10.0000 meq | EXTENDED_RELEASE_TABLET | Freq: Every day | ORAL | Status: DC
  Administered 2018-09-08: 10 meq via ORAL
  Filled 2018-09-07: qty 1

## 2018-09-07 MED ORDER — MORPHINE SULFATE (PF) 2 MG/ML IV SOLN
2.0000 mg | Freq: Once | INTRAVENOUS | Status: AC
  Administered 2018-09-07: 18:00:00 2 mg via INTRAVENOUS
  Filled 2018-09-07: qty 1

## 2018-09-07 MED ORDER — SODIUM CHLORIDE 0.9% FLUSH: 3.0000 mL | Freq: Once | INTRAVENOUS | Status: DC

## 2018-09-07 MED ORDER — IOHEXOL 300 MG/ML  SOLN
75.0000 mL | Freq: Once | INTRAMUSCULAR | Status: AC | PRN
  Administered 2018-09-07: 75 mL via INTRAVENOUS

## 2018-09-07 MED ORDER — ASPIRIN EC 81 MG PO TBEC
81.0000 mg | DELAYED_RELEASE_TABLET | Freq: Every day | ORAL | Status: DC
  Administered 2018-09-07 – 2018-09-08 (×2): 81 mg via ORAL
  Filled 2018-09-07 (×2): qty 1

## 2018-09-07 MED ORDER — ONDANSETRON HCL 4 MG/2ML IJ SOLN
4.0000 mg | Freq: Once | INTRAMUSCULAR | Status: AC
  Administered 2018-09-07: 4 mg via INTRAVENOUS
  Filled 2018-09-07: qty 2

## 2018-09-07 MED ORDER — ACETAMINOPHEN 325 MG PO TABS
650.0000 mg | ORAL_TABLET | Freq: Four times a day (QID) | ORAL | Status: DC | PRN
  Administered 2018-09-08: 650 mg via ORAL
  Filled 2018-09-07: qty 2

## 2018-09-07 MED ORDER — ACETAMINOPHEN 650 MG RE SUPP: 650.0000 mg | Freq: Four times a day (QID) | RECTAL | Status: DC | PRN

## 2018-09-07 MED ORDER — ROSUVASTATIN CALCIUM 20 MG PO TABS
20.0000 mg | ORAL_TABLET | Freq: Every day | ORAL | Status: DC
  Administered 2018-09-07: 20 mg via ORAL
  Filled 2018-09-07: qty 1
  Filled 2018-09-07: qty 2
  Filled 2018-09-07: qty 1

## 2018-09-07 MED ORDER — SODIUM CHLORIDE 0.9 % IV SOLN
INTRAVENOUS | Status: DC
  Administered 2018-09-07: 22:00:00 via INTRAVENOUS

## 2018-09-07 MED ORDER — MORPHINE SULFATE (PF) 4 MG/ML IV SOLN
4.0000 mg | Freq: Once | INTRAVENOUS | Status: AC
  Administered 2018-09-07: 4 mg via INTRAVENOUS
  Filled 2018-09-07: qty 1

## 2018-09-07 MED ORDER — RAMIPRIL 10 MG PO CAPS
10.0000 mg | ORAL_CAPSULE | Freq: Two times a day (BID) | ORAL | Status: DC
  Administered 2018-09-07: 10 mg via ORAL
  Filled 2018-09-07 (×3): qty 1

## 2018-09-07 MED ORDER — SODIUM CHLORIDE 0.9 % IV SOLN
1000.0000 mL | Freq: Once | INTRAVENOUS | Status: AC
  Administered 2018-09-07: 1000 mL via INTRAVENOUS

## 2018-09-07 MED ORDER — ONDANSETRON HCL 4 MG PO TABS: 4.0000 mg | ORAL_TABLET | Freq: Four times a day (QID) | ORAL | Status: DC | PRN

## 2018-09-07 MED ORDER — PANTOPRAZOLE SODIUM 40 MG PO TBEC
40.0000 mg | DELAYED_RELEASE_TABLET | Freq: Every day | ORAL | Status: DC
  Administered 2018-09-07: 40 mg via ORAL
  Filled 2018-09-07: qty 1

## 2018-09-07 MED ORDER — SILDENAFIL CITRATE 20 MG PO TABS
20.0000 mg | ORAL_TABLET | Freq: Three times a day (TID) | ORAL | Status: DC
  Administered 2018-09-07: 22:00:00 20 mg via ORAL
  Filled 2018-09-07 (×4): qty 1

## 2018-09-07 MED ORDER — FINASTERIDE 5 MG PO TABS
5.0000 mg | ORAL_TABLET | Freq: Every day | ORAL | Status: DC
  Administered 2018-09-07: 22:00:00 5 mg via ORAL
  Filled 2018-09-07: qty 1

## 2018-09-07 MED ORDER — TRAZODONE HCL 50 MG PO TABS: 50.0000 mg | ORAL_TABLET | Freq: Every evening | ORAL | Status: DC | PRN

## 2018-09-07 MED ORDER — ONDANSETRON HCL 4 MG/2ML IJ SOLN: 4.0000 mg | Freq: Four times a day (QID) | INTRAMUSCULAR | Status: DC | PRN

## 2018-09-07 MED ORDER — CARVEDILOL 12.5 MG PO TABS
12.5000 mg | ORAL_TABLET | Freq: Two times a day (BID) | ORAL | Status: DC
  Administered 2018-09-07 – 2018-09-08 (×2): 12.5 mg via ORAL
  Filled 2018-09-07 (×2): qty 1

## 2018-09-07 NOTE — ED Provider Notes (Signed)
[facility] Emergency Department Provider Note   ____________________________________________    I have reviewed the triage vital signs and the nursing notes.   HISTORY  Chief Complaint Emesis and Abdominal Pain     HPI [name] [name] is a 85 y.o. male who presents with complaints of nausea vomiting and abdominal pain.  Patient reports diagnosed with shingles 1 week ago, still has some pain started gabapentin on Monday which he thinks may have made him queasy.  However this morning he woke up with epigastric and left-sided abdominal pain with nausea and vomiting.  Has not take anything for this.  Describes the pain is moderate and cramping in nature.  No fevers or chills.  No sick contacts.  Past Medical History:  Diagnosis Date  . BPH (benign prostatic hyperplasia)   . Cancer ([place])    skin  . Cataract   . Coronary artery disease    a. 4v CABG 01/2005; b. cath 06/05/13 showed patent grafts: LIMA-LAD, VG-D, VG-OM3, VG-dRCA, nl filling pressures, nl LV fxn  . Diastolic dysfunction    a. echo 0/8657: nl systolic fxn, mild LVH, diastolic relaxation abnormality, mildly enlarged LA, mild Ao insufficiency  . Dyspnea    with exertion  . ED (erectile dysfunction)   . GERD (gastroesophageal reflux disease)   . Hyperlipidemia   . Hypertension   . IDDM (insulin dependent diabetes mellitus) ([place]) 2000  . Osteoarthritis   . Perirectal fistula   . Pneumonia 12/2016  . Tubular adenoma of colon 07/2012  . Vertigo     Patient Active Problem List   Diagnosis Date Noted  . AGE (acute gastroenteritis) 09/07/2018  . Shingles 09/04/2018  . Stable angina ([place]) 03/12/2018  . Chronic low back pain 03/05/2018  . Skin tear of hand without complication, initial encounter 03/05/2018  . Abnormal breast exam 01/18/2018  . PAC (premature atrial contraction) 01/18/2018  . Neuropathic pain 01/18/2018  . Demand ischemia ([place])   . Acute on chronic kidney failure ([place])  01/07/2018  . Accelerated hypertension 01/07/2018  . Bell's palsy 08/18/2017  . H/O cold sores 08/18/2017  . Bronchitis 11/29/2016  . Irregular heart beat 11/29/2016  . Insomnia 11/10/2016  . PLMD (periodic limb movement disorder) 08/17/2016  . Mild OSA 08/17/2016  . Intolerance of continuous positive airway pressure (CPAP) ventilation 08/17/2016  . Chronic arthritis 06/02/2016  . CKD (chronic kidney disease) stage 3, GFR 30-59 ml/min (HCC) 06/02/2016  . Unstable angina ([place]) 09/24/2015  . S/P CABG x 4   . Type 2 diabetes mellitus with complication, without long-term current use of insulin ([place]) 02/06/2015  . Chronic diastolic CHF (congestive heart failure) ([place]) 01/01/2014  . CAD (coronary artery disease) 06/03/2013  . BPH (benign prostatic hyperplasia)   . GERD (gastroesophageal reflux disease)   . Hyperlipidemia 09/15/2009  . Essential hypertension 09/15/2009    Past Surgical History:  Procedure Laterality Date  . BACK SURGERY    . CARDIAC CATHETERIZATION  06/05/2013   [facility].   Marland Kitchen CARDIAC CATHETERIZATION N/A 09/24/2015   Procedure: Right Heart Cath and Coronary/Graft Angiography;  Surgeon: Minna Merritts, MD;  Location: [place] [facility];  Service: Cardiovascular;  Laterality: N/A;  . CATARACT EXTRACTION  sept 2013   right  . CATARACT EXTRACTION W/PHACO Left 03/28/2017   Procedure: CATARACT EXTRACTION PHACO AND INTRAOCULAR LENS PLACEMENT (IOC);  Surgeon: Birder Robson, MD;  Location: [facility];  Service: Ophthalmology;  Laterality: Left;  [place] 00:42.0 AP% 15.0 CDE 6.31 Fluid  Pack lot # [id number]  . CHOLECYSTECTOMY  05/15/2011   Procedure: LAPAROSCOPIC CHOLECYSTECTOMY;  Surgeon: Rolm Bookbinder, MD;  Location: [address];  Service: General;  Laterality: N/A;  . COLONOSCOPY W/ POLYPECTOMY    . CORONARY ARTERY BYPASS GRAFT  2007   x 4  . EYE SURGERY    . FOOT SURGERY  2012   right foot  . JOINT REPLACEMENT  8/10   Right THR--[name]  . LEFT AND RIGHT HEART  CATHETERIZATION WITH CORONARY/GRAFT ANGIOGRAM N/A 06/05/2013   Procedure: LEFT AND RIGHT HEART CATHETERIZATION WITH Beatrix Fetters;  Surgeon: Peter M Martinique, MD;  Location: [facility];  Service: Cardiovascular;  Laterality: N/A;  . LUMBAR LAMINECTOMY  1989  . Prostate photovaporization  5/16   Dr Budd Palmer  . RIGHT/LEFT HEART CATH AND CORONARY/GRAFT ANGIOGRAPHY N/A 01/05/2018   Procedure: RIGHT/LEFT HEART CATH AND CORONARY/GRAFT ANGIOGRAPHY;  Surgeon: Minna Merritts, MD;  Location: [place] [facility];  Service: Cardiovascular;  Laterality: N/A;    Prior to Admission medications   Medication Sig Start Date End Date Taking? Authorizing Provider  acetaminophen (TYLENOL) 500 MG tablet Take 1,000 mg by mouth 2 (two) times daily.   Yes [provider]  amLODipine (NORVASC) 5 MG tablet TAKE ONE TABLET TWICE DAILY Patient taking differently: Take 5 mg by mouth 2 (two) times a day.  08/31/18  Yes Minna Merritts, MD  aspirin EC 81 MG tablet Take 81 mg by mouth daily.   Yes [provider]  BD INSULIN SYRINGE ULTRAFINE 31G X 15/64" 0.3 ML MISC USE AS DIRECTED 07/31/15  Yes Venia Carbon, MD  blood glucose meter kit and supplies KIT OneTouch Verio w/ Device Kit OneTouch Verio VI STRP OneTouch Delica Lancet Dev misc OneTouch Delica Lancets 31S or Fine  Please check CBGs fasting and 2 other times during the day  Dx Code E11.8 08/18/17  Yes [name], MD  carvedilol (COREG) 12.5 MG tablet TAKE 1 TABLET BY MOUTH TWICE DAILY Patient taking differently: Take 12.5 mg by mouth 2 (two) times a day.  03/28/18  Yes Minna Merritts, MD  finasteride (PROSCAR) 5 MG tablet Take 1 tablet (5 mg total) by mouth daily. Patient taking differently: Take 5 mg by mouth at bedtime.  04/16/12  Yes Venia Carbon, MD  furosemide (LASIX) 20 MG tablet Take 1 tablet (20 mg) by mouth twice daily 6 days a week Patient taking differently: Take 20 mg by mouth 2 (two) times daily.   03/12/18  Yes Gollan, Kathlene November, MD  gabapentin (NEURONTIN) 100 MG capsule Take 1 capsule (100 mg total) by mouth 3 (three) times daily. 09/04/18  Yes [name], MD  Insulin Pen Needle (BD PEN NEEDLE NANO U/F) 32G X 4 MM MISC USE AS DIRECTED 09/06/17  Yes [name], MD  omeprazole (PRILOSEC) 20 MG capsule TAKE 1 CAPSULE BY MOUTH TWICE DAILY Patient taking differently: Take 20 mg by mouth 2 (two) times a day.  07/16/18  Yes [name], MD  oxyCODONE-acetaminophen (PERCOCET) 5-325 MG tablet Take 1 tablet by mouth every 6 (six) hours as needed for severe pain. 09/04/18 09/04/19 Yes [name], MD  potassium chloride (K-DUR) 10 MEQ tablet Take 1 tablet (10 meq) by mouth twice daily 6 days a week Patient taking differently: Take 10 mEq by mouth 2 (two) times daily.  03/12/18  Yes Minna Merritts, MD  ramipril (ALTACE) 10 MG capsule Take 1 capsule (10 mg total) by mouth  2 (two) times daily. 01/05/18  Yes Gollan, Kathlene November, MD  rosuvastatin (CRESTOR) 20 MG tablet Take 1 tablet (20 mg total) by mouth daily. Patient taking differently: Take 20 mg by mouth at bedtime.  01/09/18  Yes Fritzi Mandes, MD  TOUJEO SOLOSTAR 300 UNIT/ML SOPN INJECT 50-60 UNITS INTO THE SKIN DAILY .INCREASE BY 1 UNITS/DAY UNTILFBS ARE BETWEEN 80-130. DO NOT EXCEED 60 UNITS PER DAY. Patient taking differently: Inject 46-48 Units into the skin daily.  09/21/17  Yes [name], MD  traZODone (DESYREL) 50 MG tablet TAKE ONE TO TWO TABLETS BY MOUTH AT BEDTIME AS NEEDED FOR SLEEP Patient taking differently: Take 50-100 mg by mouth at bedtime as needed for sleep.  02/15/18  Yes [name], MD  Vitamin D, Cholecalciferol, 1000 units TABS Take 1,000 Units by mouth daily.    Yes [provider]  lidocaine (LIDODERM) 5 % Place 1 patch onto the skin every 12 (twelve) hours. Remove & Discard patch within 12 hours or as directed by MD Patient not taking: Reported on 09/07/2018 08/27/18 08/27/19  Nena Polio, MD  sildenafil (REVATIO) 20 MG tablet Take 1 tablet (20 mg total) by mouth 3 (three) times daily. Patient not taking: Reported on 09/07/2018 07/09/18   Minna Merritts, MD     Allergies Cortisone, Metformin, and Metformin and related  Family History  Problem Relation Age of Onset  . Lung cancer Mother   . Heart disease Father   . Colon cancer Neg Hx   . Breast cancer Neg Hx     Social History Social History   Tobacco Use  . Smoking status: Never Smoker  . Smokeless tobacco: Never Used  Substance Use Topics  . Alcohol use: Yes    Comment: occ cocktail  . Drug use: No    Review of Systems  Constitutional: No fever/chills Eyes: No visual changes.  ENT: No sore throat. Cardiovascular: Denies chest pain. Respiratory: Denies shortness of breath. Gastrointestinal: As above Genitourinary: Negative for dysuria. Musculoskeletal: Negative for back pain. Skin: Negative for rash. Neurological: Negative for headaches or weakness   ____________________________________________   PHYSICAL EXAM:  VITAL SIGNS: ED Triage Vitals  Enc Vitals Group     BP 09/07/18 1407 (!) 126/58     Pulse Rate 09/07/18 1407 74     Resp 09/07/18 1407 18     Temp 09/07/18 1407 97.9 F (36.6 C)     Temp Source 09/07/18 1407 Oral     SpO2 09/07/18 1407 98 %     Weight --      Height --      Head Circumference --      Peak Flow --      Pain Score 09/07/18 1412 6     Pain Loc --      Pain Edu? --      Excl. in [place]? --     Constitutional: Alert and oriented.  Eyes: Conjunctivae are normal.   Nose: No congestion/rhinnorhea. Mouth/Throat: Mucous membranes are moist.    Cardiovascular: Normal rate, regular rhythm. Grossly normal heart sounds.  Good peripheral circulation. Respiratory: Normal respiratory effort.  No retractions. Lungs CTAB. Gastrointestinal: Abdomen soft, mild tenderness left lower quadrant, mild distention Genitourinary: deferred Musculoskeletal: Warm and well  perfused Neurologic:  Normal speech and language. No gross focal neurologic deficits are appreciated.  Skin:  Skin is warm, dry and intact. No rash noted. Psychiatric: Mood and affect are normal. Speech and behavior are normal.  ____________________________________________  LABS (all labs ordered are listed, but only abnormal results are displayed)  Labs Reviewed  COMPREHENSIVE METABOLIC PANEL - Abnormal; Notable for the following components:      Result Value   CO2 20 (*)    Glucose, Bld 163 (*)    BUN 48 (*)    Creatinine, Ser 1.82 (*)    Alkaline Phosphatase 145 (*)    GFR calc non Af Amer 34 (*)    GFR calc Af Amer 39 (*)    All other components within normal limits  CBC - Abnormal; Notable for the following components:   WBC 14.9 (*)    MCHC 36.2 (*)    Platelets 128 (*)    All other components within normal limits  NOVEL CORONAVIRUS, NAA (HOSPITAL ORDER, SEND-OUT TO REF LAB)  LIPASE, BLOOD  URINALYSIS, COMPLETE (UACMP) WITH MICROSCOPIC  HEMOGLOBIN A1C  URINALYSIS, COMPLETE (UACMP) WITH MICROSCOPIC  BASIC METABOLIC PANEL  CBC  POCT GASTRIC OCCULT BLOOD (1-CARD TO LAB)   ____________________________________________  EKG  None ____________________________________________  RADIOLOGY  CT abdomen pelvis ____________________________________________   PROCEDURES  Procedure(s) performed: No  Procedures   Critical Care performed: No ____________________________________________   INITIAL IMPRESSION / ASSESSMENT AND PLAN / ED COURSE  Pertinent labs & imaging results that were available during my care of the patient were reviewed by me and considered in my medical decision making (see chart for details).  Patient presents with abdominal pain is described above, mild tenderness left lower quadrant with nausea and vomiting.  Differential includes diverticulitis, SBO, gastroenteritis.  Elevated white blood cell count and evidence of dehydration, will give IV  fluids, IV Zofran, IV morphine and obtain CT abdomen pelvis   CT scan is overall reassuring, however the patient continues have pain and nausea, have discussed with the hospitalist for admission    ____________________________________________   FINAL CLINICAL IMPRESSION(S) / ED DIAGNOSES  Final diagnoses:  Left lower quadrant abdominal pain  Nausea and vomiting, intractability of vomiting not specified, unspecified vomiting type        Note:  This document was prepared using Dragon voice recognition software and may include unintentional dictation errors.   Lavonia Drafts, MD 09/07/18 2009

## 2018-09-07 NOTE — H&P (Signed)
[facility] NAME: [name] [name]    MR#:  [id number]  DATE OF BIRTH:  Dec 11, 1933  DATE OF ADMISSION:  09/07/2018  PRIMARY CARE PHYSICIAN: [name], MD   REQUESTING/REFERRING PHYSICIAN: Lavonia Drafts, MD  CHIEF COMPLAINT:   Chief Complaint  Patient presents with  . Emesis  . Abdominal Pain    HISTORY OF PRESENT ILLNESS:  [name] [name]  is a 85 y.o. male with a known history of hypertension, diabetes mellitus, diastolic CHF, GERD, as well as recently being diagnosed with shingles.  He saw his primary care physician on 09/04/2018 with continued pain related to shingles.  He was instructed to continue Valtrex until completed and started on low-dose Neurontin at that time.  Patient feels the Neurontin may be attributing to the nausea.  He began experiencing emesis early this morning with continued nausea.  He describes the emesis as dark brown.  Gastroccult is pending.  He denies diarrhea.  No noted melena.  He denies a history of GI bleed.  He denies fever, chills, chest pain, shortness of breath.  CT abdomen was completed with no acute findings.  WBC is 14.9.  Hemoglobin is 14.1 with hematocrit 39 and platelet count 128.  He has a history of chronic renal failure with mild increase noted in BUN and creatinine today with BUN 48 and creatinine 1.82.  This is increased from 08/27/2018 with BUN 30 and creatinine 1.66.  We have admitted him to the hospitalist service for observation with acute gastroenteritis to rule out GI bleed.  PAST MEDICAL HISTORY:   Past Medical History:  Diagnosis Date  . BPH (benign prostatic hyperplasia)   . Cancer ([place])    skin  . Cataract   . Coronary artery disease    a. 4v CABG 01/2005; b. cath 06/05/13 showed patent grafts: LIMA-LAD, VG-D, VG-OM3, VG-dRCA, nl filling pressures, nl LV fxn  . Diastolic dysfunction    a. echo 11/6220: nl systolic fxn, mild LVH, diastolic relaxation abnormality, mildly  enlarged LA, mild Ao insufficiency  . Dyspnea    with exertion  . ED (erectile dysfunction)   . GERD (gastroesophageal reflux disease)   . Hyperlipidemia   . Hypertension   . IDDM (insulin dependent diabetes mellitus) ([place]) 2000  . Osteoarthritis   . Perirectal fistula   . Pneumonia 12/2016  . Tubular adenoma of colon 07/2012  . Vertigo     PAST SURGICAL HISTORY:   Past Surgical History:  Procedure Laterality Date  . BACK SURGERY    . CARDIAC CATHETERIZATION  [facility]   [facility]   Marland Kitchen CARDIAC CATHETERIZATION N/A 09/24/2015   Procedure: Right Heart Cath and Coronary/Graft Angiography;  Surgeon: Minna Merritts, MD;  Location: [facility];  Service: Cardiovascular;  Laterality: N/A;  . CATARACT EXTRACTION  sept 2013   right  . CATARACT EXTRACTION W/PHACO Left 03/28/2017   Procedure: CATARACT EXTRACTION PHACO AND INTRAOCULAR LENS PLACEMENT (IOC);  Surgeon: Birder Robson, MD;  Location: [facility];  Service: Ophthalmology;  Laterality: Left;  [place] 00:42.0 AP% 15.0 CDE 6.31 Fluid Pack lot # [id number]  . CHOLECYSTECTOMY  05/15/2011   Procedure: LAPAROSCOPIC CHOLECYSTECTOMY;  Surgeon: Rolm Bookbinder, MD;  Location: [facility];  Service: General;  Laterality: N/A;  . COLONOSCOPY W/ POLYPECTOMY    . CORONARY ARTERY BYPASS GRAFT  2007   x 4  . EYE SURGERY    . FOOT SURGERY  2012   right foot  .  JOINT REPLACEMENT  8/10   Right THR--[name]  . LEFT AND RIGHT HEART CATHETERIZATION WITH CORONARY/GRAFT ANGIOGRAM N/A 06/05/2013   Procedure: LEFT AND RIGHT HEART CATHETERIZATION WITH Beatrix Fetters;  Surgeon: Peter M Martinique, MD;  Location: [facility];  Service: Cardiovascular;  Laterality: N/A;  . LUMBAR LAMINECTOMY  1989  . Prostate photovaporization  5/16   Dr Budd Palmer  . RIGHT/LEFT HEART CATH AND CORONARY/GRAFT ANGIOGRAPHY N/A 01/05/2018   Procedure: RIGHT/LEFT HEART CATH AND CORONARY/GRAFT ANGIOGRAPHY;  Surgeon: Minna Merritts, MD;  Location: [place];   Service: Cardiovascular;  Laterality: N/A;    SOCIAL HISTORY:   Social History   Tobacco Use  . Smoking status: Never Smoker  . Smokeless tobacco: Never Used  Substance Use Topics  . Alcohol use: Yes    Comment: occ cocktail    FAMILY HISTORY:   Family History  Problem Relation Age of Onset  . Lung cancer Mother   . Heart disease Father   . Colon cancer Neg Hx   . Breast cancer Neg Hx     DRUG ALLERGIES:   Allergies  Allergen Reactions  . Cortisone Other (See Comments)    Reaction: Leg Cramps  . Metformin Diarrhea  . Metformin And Related Diarrhea    REVIEW OF SYSTEMS:   Review of Systems  Constitutional: Positive for malaise/fatigue. Negative for chills, diaphoresis and fever.  HENT: Negative for congestion, sinus pain and sore throat.   Eyes: Negative for blurred vision and double vision.  Respiratory: Negative for cough, hemoptysis, sputum production, shortness of breath and wheezing.   Cardiovascular: Negative for chest pain and palpitations.  Gastrointestinal: Positive for abdominal pain (general tenderness), nausea and vomiting. Negative for blood in stool, constipation, diarrhea, heartburn and melena.  Genitourinary: Negative for dysuria, flank pain and hematuria.  Musculoskeletal: Negative for falls, joint pain and myalgias.  Skin: Positive for rash (healing red lesions -left mid-back area). Negative for itching.  Neurological: Negative for dizziness, focal weakness, weakness and headaches.  Psychiatric/Behavioral: Negative.  Negative for depression.    MEDICATIONS AT HOME:   Prior to Admission medications   Medication Sig Start Date End Date Taking? Authorizing Provider  acetaminophen (TYLENOL) 500 MG tablet Take 1,000 mg by mouth 2 (two) times daily.    [provider]  amLODipine (NORVASC) 5 MG tablet TAKE ONE TABLET TWICE DAILY 08/31/18   Minna Merritts, MD  aspirin EC 81 MG tablet Take 81 mg by mouth daily.    [provider]   BD INSULIN SYRINGE ULTRAFINE 31G X 15/64" 0.3 ML MISC USE AS DIRECTED 07/31/15   Viviana Simpler I, MD  blood glucose meter kit and supplies KIT OneTouch Verio w/ Device Kit OneTouch Verio VI STRP OneTouch Delica Lancet Dev misc OneTouch Delica Lancets 50V or Fine  Please check CBGs fasting and 2 other times during the day  Dx Code E11.8 08/18/17   [name], MD  carvedilol (COREG) 12.5 MG tablet TAKE 1 TABLET BY MOUTH TWICE DAILY 03/28/18   Minna Merritts, MD  finasteride (PROSCAR) 5 MG tablet Take 1 tablet (5 mg total) by mouth daily. Patient taking differently: Take 5 mg by mouth at bedtime.  04/16/12   Venia Carbon, MD  furosemide (LASIX) 20 MG tablet Take 1 tablet (20 mg) by mouth twice daily 6 days a week 03/12/18   Minna Merritts, MD  gabapentin (NEURONTIN) 100 MG capsule Take 1 capsule (100 mg total) by mouth 3 (three) times daily.  09/04/18   [name], MD  Insulin Pen Needle (BD PEN NEEDLE NANO U/F) 32G X 4 MM MISC USE AS DIRECTED 09/06/17   [name], MD  lidocaine (LIDODERM) 5 % Place 1 patch onto the skin every 12 (twelve) hours. Remove & Discard patch within 12 hours or as directed by MD 08/27/18 08/27/19  Nena Polio, MD  omeprazole (PRILOSEC) 20 MG capsule TAKE 1 CAPSULE BY MOUTH TWICE DAILY 07/16/18   [name], MD  oxyCODONE-acetaminophen (PERCOCET) 5-325 MG tablet Take 1 tablet by mouth every 6 (six) hours as needed for severe pain. 09/04/18 09/04/19  [name], MD  potassium chloride (K-DUR) 10 MEQ tablet Take 1 tablet (10 meq) by mouth twice daily 6 days a week 03/12/18   Minna Merritts, MD  ramipril (ALTACE) 10 MG capsule Take 1 capsule (10 mg total) by mouth 2 (two) times daily. 01/05/18   Minna Merritts, MD  rosuvastatin (CRESTOR) 20 MG tablet Take 1 tablet (20 mg total) by mouth daily. 01/09/18   Fritzi Mandes, MD  sildenafil (REVATIO) 20 MG tablet Take 1 tablet (20 mg total) by mouth 3 (three) times daily. 07/09/18   Gollan,  Kathlene November, MD  TOUJEO SOLOSTAR 300 UNIT/ML SOPN INJECT 50-60 UNITS INTO THE SKIN DAILY .INCREASE BY 1 UNITS/DAY UNTILFBS ARE BETWEEN 80-130. DO NOT EXCEED 60 UNITS PER DAY. Patient taking differently: Inject 46-48 Units into the skin daily.  09/21/17   [name], MD  traZODone (DESYREL) 50 MG tablet TAKE ONE TO TWO TABLETS BY MOUTH AT BEDTIME AS NEEDED FOR SLEEP 02/15/18   [name], MD  vitamin B-12 (CYANOCOBALAMIN) 50 MCG tablet Take 50 mcg by mouth daily.    [provider]  Vitamin D, Cholecalciferol, 1000 units TABS Take 1,000 Units by mouth daily.     [provider]      VITAL SIGNS:  Blood pressure 130/65, pulse 65, temperature 97.9 F (36.6 C), temperature source Oral, resp. rate 13, SpO2 99 %.  PHYSICAL EXAMINATION:  Physical Exam  GENERAL:  85 y.o.-year-old patient lying in the bed with no acute distress.  EYES: Pupils equal, round, reactive to light and accommodation. No scleral icterus. Extraocular muscles intact.  HEENT: Head atraumatic, normocephalic. Oropharynx and nasopharynx clear.  NECK:  Supple, no jugular venous distention. No thyroid enlargement, no tenderness.  LUNGS: Normal breath sounds bilaterally, no wheezing, rales,rhonchi or crepitation. No use of accessory muscles of respiration.  CARDIOVASCULAR: Regular rate and rhythm, S1, S2 normal. No murmurs, rubs, or gallops.  ABDOMEN: Soft, nondistended, generalized tenderness. Bowel sounds present. No organomegaly or mass.  EXTREMITIES: No pedal edema, cyanosis, or clubbing.  NEUROLOGIC: Cranial nerves II through XII are intact. Muscle strength 5/5 in all extremities. Sensation intact. Gait not checked.  PSYCHIATRIC: The patient is alert and oriented x 3.  Normal affect and good eye contact. SKIN: Left mid back healing red lesions LABORATORY PANEL:   CBC Recent Labs  Lab 09/07/18 1414  WBC 14.9*  HGB 14.1  HCT 39.0  PLT 128*    ------------------------------------------------------------------------------------------------------------------  Chemistries  Recent Labs  Lab 09/07/18 1414  NA 137  K 5.1  CL 109  CO2 20*  GLUCOSE 163*  BUN 48*  CREATININE 1.82*  CALCIUM 9.1  AST 27  ALT 36  ALKPHOS 145*  BILITOT 1.1   ------------------------------------------------------------------------------------------------------------------  Cardiac Enzymes No results for input(s): TROPONINI in the last 168 hours. ------------------------------------------------------------------------------------------------------------------  RADIOLOGY:  Ct Abdomen Pelvis W  Contrast  Result Date: 09/07/2018 CLINICAL DATA:  Nausea and vomiting.  Abdominal tenderness. EXAM: CT ABDOMEN AND PELVIS WITH CONTRAST TECHNIQUE: Multidetector CT imaging of the abdomen and pelvis was performed using the standard protocol following bolus administration of intravenous contrast. CONTRAST:  56m OMNIPAQUE IOHEXOL 300 MG/ML  SOLN COMPARISON:  March 09, 2015 FINDINGS: Lower chest: No acute abnormality. Hepatobiliary: The patient is status post cholecystectomy. No liver masses are identified. The common bile duct is patent. Mild prominence of the intra and extrahepatic bile ducts is identified, more prominent since 2017 but mild. No filling defects seen in the common bile duct. Pancreas: Unremarkable. No pancreatic ductal dilatation or surrounding inflammatory changes. Spleen: Normal in size without focal abnormality. Adrenals/Urinary Tract: Adrenal glands are within normal limits. There is an exophytic cyst off the anterior left kidney. No suspicious renal masses. No hydronephrosis or acute perinephric stranding. The ureters are unremarkable. The bladder is normal. Delayed images through the kidneys are unremarkable. Stomach/Bowel: The stomach is poorly distended limiting evaluation. However, given the lack of distension, no acute abnormalities are seen.  The small bowel is normal. The colon and appendix are unremarkable. Vascular/Lymphatic: Atherosclerotic changes are seen in the nonaneurysmal aorta. No adenopathy. Reproductive: The prostate is enlarged measuring 7.2 by 6.0 cm on axial images. Other: No free air or free fluid. Musculoskeletal: The patient is status post right hip replacement. Degenerative changes are seen in the right SI joint in the visualized spine. IMPRESSION: 1. No acute abnormalities identified to explain the patient's symptoms. 2. Mild intra and extrahepatic biliary duct prominence may be due to previous cholecystectomy. Recommend correlation with labs. 3. Atherosclerotic changes in the aorta. 4. Prostate enlargement. 5. Degenerative changes. Electronically Signed   By: DDorise BullionIII M.D   On: 09/07/2018 16:24      IMPRESSION AND PLAN:   1.  Acute gastroenteritis - Will treat symptomatically with IV antiemetic and analgesic for pain management - Patient has normal saline infusing to peripheral IV at 75 cc/h - Pending Gastroccult - We will repeat CBC and BMP in the a.m.  2.  Shingles-active exacerbation - Patient recently completed course of Valtrex - Recently started Neurontin however he feels this may be contributing to his nausea.  Will hold Neurontin for now. - Lesions appear to be healing at this time with no evidence of infection - Will treat associated pain with analgesic  3.  Dehydration - Patient is receiving IV fluid replacement with normal saline at 75 cc/h -Repeat CBC and BMP in the a.m. -Telemetry monitor  4.  Acute on chronic renal failure - Mildly increased BUN and creatinine - Likely secondary to dehydration- acute gastroenteritis - Will monitor labs closely for improvement with rehydration  5.  Diabetes mellitus - Moderate sliding scale insulin -Hemoglobin A1c pending  DVT and PPI prophylaxis initiated   All the records are reviewed and case discussed with ED provider. The plan of care  was discussed in details with the patient (and family). I answered all questions. The patient agreed to proceed with the above mentioned plan. Further management will depend upon hospital course.   CODE STATUS: Full code  TOTAL TIME TAKING CARE OF THIS PATIENT: 438mutes.    AnTheo Dillseals CRNPon 09/07/2018 at 6:08 PM  Pager - [phone] 6pm go to [email or web address] - paTechnical [facility]  Office  [phone]: Primary care physician; SoLeone HavenMD   Note: This dictation was prepared with DrViviann Spare  dictation along with smaller phrase technology. Any transcriptional errors that result from this process are unintentional.

## 2018-09-07 NOTE — ED Notes (Signed)
ED TO INPATIENT HANDOFF REPORT  ED Nurse Name and Phone #: Anderson [place] [phone]  S Name/Age/Gender [name] [name] 85 y.o. male Room/Bed: ED06A/ED06A  Code Status   Code Status: Prior  Home/SNF/Other Home Patient oriented to: self, place, time and situation Is this baseline? Yes   Triage Complete: Triage complete  Chief Complaint vomiting abd pain  Triage Note Pt to ED via POV c/o emesis. Pt states that he woke up this morning and was having some nausea and about 20 minutes later he started vomiting. Pt states that he was started on Gabapentin on Monday due to pain from Shingles and is unsure if this caused his symptoms. Pt states that the is having some tenderness in his abdomen. Pt is in NAD.    Allergies Allergies  Allergen Reactions  . Cortisone Other (See Comments)    Reaction: Leg Cramps  . Metformin Diarrhea  . Metformin And Related Diarrhea    Level of Care/Admitting Diagnosis ED Disposition    ED Disposition Condition [facility] Area: [place] [[id number]]  Level of Care: Med-Surg [16]  Covid Evaluation: Asymptomatic Screening Protocol (No Symptoms)  Diagnosis: AGE (acute gastroenteritis) [696295]  Admitting Physician: Henreitta Leber [[id number]]  Attending Physician: Henreitta Leber [[id number]]  PT Class (Do Not Modify): Observation [104]  PT Acc Code (Do Not Modify): Observation [10022]       B Medical/Surgery History Past Medical History:  Diagnosis Date  . BPH (benign prostatic hyperplasia)   . Cancer ([place])    skin  . Cataract   . Coronary artery disease    a. 4v CABG 01/2005; b. cath 06/05/13 showed patent grafts: LIMA-LAD, VG-D, VG-OM3, VG-dRCA, nl filling pressures, nl LV fxn  . Diastolic dysfunction    a. echo 09/2534: nl systolic fxn, mild LVH, diastolic relaxation abnormality, mildly enlarged LA, mild Ao insufficiency  . Dyspnea    with exertion  . ED (erectile dysfunction)   . GERD (gastroesophageal reflux  disease)   . Hyperlipidemia   . Hypertension   . IDDM (insulin dependent diabetes mellitus) ([place]) 2000  . Osteoarthritis   . Perirectal fistula   . Pneumonia 12/2016  . Tubular adenoma of colon 07/2012  . Vertigo    Past Surgical History:  Procedure Laterality Date  . BACK SURGERY    . CARDIAC CATHETERIZATION  06/05/2013   [facility]   Marland Kitchen CARDIAC CATHETERIZATION N/A 09/24/2015   Procedure: Right Heart Cath and Coronary/Graft Angiography;  Surgeon: Minna Merritts, MD;  Location: [place] [facility];  Service: Cardiovascular;  Laterality: N/A;  . CATARACT EXTRACTION  sept 2013   right  . CATARACT EXTRACTION W/PHACO Left 03/28/2017   Procedure: CATARACT EXTRACTION PHACO AND INTRAOCULAR LENS PLACEMENT (IOC);  Surgeon: Birder Robson, MD;  Location: [facility];  Service: Ophthalmology;  Laterality: Left;  [place] 00:42.0 AP% 15.0 CDE 6.31 Fluid Pack lot # [id number]  . CHOLECYSTECTOMY  05/15/2011   Procedure: LAPAROSCOPIC CHOLECYSTECTOMY;  Surgeon: Rolm Bookbinder, MD;  Location: [place];  Service: General;  Laterality: N/A;  . COLONOSCOPY W/ POLYPECTOMY    . CORONARY ARTERY BYPASS GRAFT  2007   x 4  . EYE SURGERY    . FOOT SURGERY  2012   right foot  . JOINT REPLACEMENT  8/10   Right THR--[name]  . LEFT AND RIGHT HEART CATHETERIZATION WITH CORONARY/GRAFT ANGIOGRAM N/A 06/05/2013   Procedure: LEFT AND RIGHT HEART CATHETERIZATION WITH Beatrix Fetters;  Surgeon: Peter M Martinique, MD;  Location: [facility];  Service: Cardiovascular;  Laterality: N/A;  . LUMBAR LAMINECTOMY  1989  . Prostate photovaporization  5/16   Dr Budd Palmer  . RIGHT/LEFT HEART CATH AND CORONARY/GRAFT ANGIOGRAPHY N/A 01/05/2018   Procedure: RIGHT/LEFT HEART CATH AND CORONARY/GRAFT ANGIOGRAPHY;  Surgeon: Minna Merritts, MD;  Location: [place] [facility];  Service: Cardiovascular;  Laterality: N/A;     A IV Location/Drains/Wounds Patient Lines/Drains/Airways Status   Active Line/Drains/Airways    Name:    Placement date:   Placement time:   Site:   Days:   Peripheral IV 09/07/18 Left Forearm   09/07/18    1526    Forearm   less than 1   Incision 05/15/11 Abdomen Other (Comment)   05/15/11    1431     2672   Incision (Closed) 03/28/17 Eye Left   03/28/17    0701     528          Intake/Output Last 24 hours No intake or output data in the 24 hours ending 09/07/18 1845  Labs/Imaging Results for orders placed or performed during the hospital encounter of 09/07/18 (from the past 48 hour(s))  Lipase, blood     Status: None   Collection Time: 09/07/18  2:14 PM  Result Value Ref Range   Lipase 28 11 - 51 U/L    Comment: Performed at [facility], [address] [place], [place] [address]  Comprehensive metabolic panel     Status: Abnormal   Collection Time: 09/07/18  2:14 PM  Result Value Ref Range   Sodium 137 135 - 145 mmol/L   Potassium 5.1 3.5 - 5.1 mmol/L   Chloride 109 98 - 111 mmol/L   CO2 20 (L) 22 - 32 mmol/L   Glucose, Bld 163 (H) 70 - 99 mg/dL   BUN 48 (H) 8 - 23 mg/dL   Creatinine, Ser 1.82 (H) 0.61 - 1.24 mg/dL   Calcium 9.1 8.9 - 10.3 mg/dL   Total Protein 6.8 6.5 - 8.1 g/dL   Albumin 4.2 3.5 - 5.0 g/dL   AST 27 15 - 41 U/L   ALT 36 0 - 44 U/L   Alkaline Phosphatase 145 (H) 38 - 126 U/L   Total Bilirubin 1.1 0.3 - 1.2 mg/dL   GFR calc non Af Amer 34 (L) >60 mL/min   GFR calc Af Amer 39 (L) >60 mL/min   Anion gap 8 5 - 15    Comment: Performed at [facility], [address] [place], [place] [address]  CBC     Status: Abnormal   Collection Time: 09/07/18  2:14 PM  Result Value Ref Range   WBC 14.9 (H) 4.0 - 10.5 K/uL   RBC 4.29 4.22 - 5.81 MIL/uL   Hemoglobin 14.1 13.0 - 17.0 g/dL   HCT 39.0 39.0 - 52.0 %   MCV 90.9 80.0 - 100.0 fL   MCH 32.9 26.0 - 34.0 pg   MCHC 36.2 (H) 30.0 - 36.0 g/dL   RDW 12.4 11.5 - 15.5 %   Platelets 128 (L) 150 - 400 K/uL   nRBC 0.0 0.0 - 0.2 %    Comment: Performed at [facility], [address] [place],  [address]   Ct Abdomen Pelvis W Contrast  Result Date: 09/07/2018 CLINICAL DATA:  Nausea and vomiting.  Abdominal tenderness. EXAM: CT ABDOMEN AND PELVIS WITH CONTRAST TECHNIQUE: Multidetector CT imaging of the abdomen and pelvis was performed using the standard protocol  following bolus administration of intravenous contrast. CONTRAST:  89mL OMNIPAQUE IOHEXOL 300 MG/ML  SOLN COMPARISON:  March 09, 2015 FINDINGS: Lower chest: No acute abnormality. Hepatobiliary: The patient is status post cholecystectomy. No liver masses are identified. The common bile duct is patent. Mild prominence of the intra and extrahepatic bile ducts is identified, more prominent since 2017 but mild. No filling defects seen in the common bile duct. Pancreas: Unremarkable. No pancreatic ductal dilatation or surrounding inflammatory changes. Spleen: Normal in size without focal abnormality. Adrenals/Urinary Tract: Adrenal glands are within normal limits. There is an exophytic cyst off the anterior left kidney. No suspicious renal masses. No hydronephrosis or acute perinephric stranding. The ureters are unremarkable. The bladder is normal. Delayed images through the kidneys are unremarkable. Stomach/Bowel: The stomach is poorly distended limiting evaluation. However, given the lack of distension, no acute abnormalities are seen. The small bowel is normal. The colon and appendix are unremarkable. Vascular/Lymphatic: Atherosclerotic changes are seen in the nonaneurysmal aorta. No adenopathy. Reproductive: The prostate is enlarged measuring 7.2 by 6.0 cm on axial images. Other: No free air or free fluid. Musculoskeletal: The patient is status post right hip replacement. Degenerative changes are seen in the right SI joint in the visualized spine. IMPRESSION: 1. No acute abnormalities identified to explain the patient's symptoms. 2. Mild intra and extrahepatic biliary duct prominence may be due to previous cholecystectomy.  Recommend correlation with labs. 3. Atherosclerotic changes in the aorta. 4. Prostate enlargement. 5. Degenerative changes. Electronically Signed   By: Dorise Bullion III M.D   On: 09/07/2018 16:24    Pending Labs Unresulted Labs (From admission, onward)    Start     Ordered   09/07/18 1808  [place]vel Coronavirus,NAA,(SEND-OUT TO REF LAB - TAT 24-48 hrs); Hosp Order  (Asymptomatic Patients Labs)  Once,   STAT    Question:  Rule Out  Answer:  Yes   09/07/18 1807   09/07/18 1414  Urinalysis, Complete w Microscopic  ONCE - STAT,   STAT     09/07/18 1413   Signed and Held  Hemoglobin A1c  Once,   R     Signed and Held   Signed and Held  Urinalysis, Complete w Microscopic  Once,   R     Signed and Held   Signed and Held  Basic metabolic panel  Tomorrow morning,   R     Signed and Held   Signed and Held  CBC  Tomorrow morning,   R     Signed and Held          Vitals/Pain Today's Vitals   09/07/18 1530 09/07/18 1700 09/07/18 1800 09/07/18 1830  BP: (!) 149/70 (!) 158/66 130/65 (!) 148/50  Pulse: 65 (!) 50 65 (!) 59  Resp: 20 17 13 17   Temp:      TempSrc:      SpO2: 97% 97% 99% 97%  PainSc:        Isolation Precautions No active isolations  Medications Medications  sodium chloride flush (NS) 0.9 % injection 3 mL (3 mLs Intravenous Not Given 09/07/18 1717)  morphine 4 MG/ML injection 4 mg (4 mg Intravenous Given 09/07/18 1531)  ondansetron (ZOFRAN) injection 4 mg (4 mg Intravenous Given 09/07/18 1531)  0.9 %  sodium chloride infusion (0 mLs Intravenous Stopped 09/07/18 1715)  iohexol (OMNIPAQUE) 300 MG/ML solution 75 mL (75 mLs Intravenous Contrast Given 09/07/18 1549)  morphine 2 MG/ML injection 2 mg (2 mg Intravenous Given 09/07/18 1744)  Mobility walks Low fall risk   Focused Assessments Cardiac Assessment Handoff:    Lab Results  Component Value Date   CKTOTAL 122 01/24/2013   TROPONINI <0.03 08/27/2018   No results found for: DDIMER Does the Patient currently have chest  pain? No     R Recommendations: See Admitting Provider Note  Report given to:   Additional Notes:

## 2018-09-07 NOTE — ED Triage Notes (Signed)
Pt to ED via POV c/o emesis. Pt states that he woke up this morning and was having some nausea and about 20 minutes later he started vomiting. Pt states that he was started on Gabapentin on Monday due to pain from Shingles and is unsure if this caused his symptoms. Pt states that the is having some tenderness in his abdomen. Pt is in NAD.

## 2018-09-07 NOTE — Telephone Encounter (Signed)
Wife, [name] called in but husband there with her.  See triage notes.  Pt vomited brown liquid 4 times this morning.   He has IDDM and has not eaten or been drinking anything today.   He is also having abd pain above his navel.  I have referred him to the ED.  His wife is going to take him to [facility] now. I let them know the office is closed today for the July 4th holiday.    Reason for Disposition . Patient sounds very sick or weak to the triager    Vomited brown liquid 4 times this morning.   Not eating or drinking.  IDDM.  Abd pain.  Answer Assessment - Initial Assessment Questions 1. LOCATION: "Where does it hurt?"      He vomited this morning 4 times.  Nothing to eat or drink today.   Wife [name] calling in.   Pain started at 7:00 AM this morning in his gut area and stomach above the navel. 2. RADIATION: "Does the pain shoot anywhere else?" (e.g., chest, back)     No 3. ONSET: "When did the pain begin?" (Minutes, hours or days ago)      7:00 AM this morning.     4. SUDDEN: "Gradual or sudden onset?"     Suddenly when he woke up this morning. 5. PATTERN "Does the pain come and go, or is it constant?"    - If constant: "Is it getting better, staying the same, or worsening?"      (Note: Constant means the pain never goes away completely; most serious pain is constant and it progresses)     - If intermittent: "How long does it last?" "Do you have pain now?"     (Note: Intermittent means the pain goes away completely between bouts)     Constant 6. SEVERITY: "How bad is the pain?"  (e.g., Scale 1-10; mild, moderate, or severe)    - MILD (1-3): doesn't interfere with normal activities, abdomen soft and not tender to touch     - MODERATE (4-7): interferes with normal activities or awakens from sleep, tender to touch     - SEVERE (8-10): excruciating pain, doubled over, unable to do any normal activities       7 on pain scale 7. RECURRENT  SYMPTOM: "Have you ever had this type of abdominal pain before?" If so, ask: "When was the last time?" and "What happened that time?"      No   He has not taken his medications, or eaten or drinking today.    He was started on gabapentin for Shingles on Tuesday.  He has been on the gabapentin since Tuesday.    He was taking oxycodone 3 times a day for 10 days.    8. CAUSE: "What do you think is causing the abdominal pain?"     No 9. RELIEVING/AGGRAVATING FACTORS: "What makes it better or worse?" (e.g., movement, antacids, bowel movement)     Nothing changes 10. OTHER SYMPTOMS: "Has there been any vomiting, diarrhea, constipation, or urine problems?"       No diarrhea.   He is constipated from the pain medicine.   Urine is normal color.  Protocols used: ABDOMINAL PAIN - MALE-A-AH

## 2018-09-08 ENCOUNTER — Other Ambulatory Visit: Payer: Self-pay

## 2018-09-08 DIAGNOSIS — E860 Dehydration: Secondary | ICD-10-CM | POA: Diagnosis not present

## 2018-09-08 DIAGNOSIS — K529 Noninfective gastroenteritis and colitis, unspecified: Secondary | ICD-10-CM | POA: Diagnosis not present

## 2018-09-08 DIAGNOSIS — B029 Zoster without complications: Secondary | ICD-10-CM | POA: Diagnosis not present

## 2018-09-08 DIAGNOSIS — R109 Unspecified abdominal pain: Secondary | ICD-10-CM | POA: Diagnosis not present

## 2018-09-08 LAB — URINALYSIS, COMPLETE (UACMP) WITH MICROSCOPIC
Bacteria, UA: NONE SEEN
Bilirubin Urine: NEGATIVE
Glucose, UA: NEGATIVE mg/dL
Hgb urine dipstick: NEGATIVE
Ketones, ur: NEGATIVE mg/dL
Leukocytes,Ua: NEGATIVE
Nitrite: NEGATIVE
Protein, ur: NEGATIVE mg/dL
Specific Gravity, Urine: 1.027 (ref 1.005–1.030)
pH: 5 (ref 5.0–8.0)

## 2018-09-08 LAB — BASIC METABOLIC PANEL
Anion gap: 6 (ref 5–15)
BUN: 43 mg/dL — ABNORMAL HIGH (ref 8–23)
CO2: 22 mmol/L (ref 22–32)
Calcium: 8.6 mg/dL — ABNORMAL LOW (ref 8.9–10.3)
Chloride: 112 mmol/L — ABNORMAL HIGH (ref 98–111)
Creatinine, Ser: 1.87 mg/dL — ABNORMAL HIGH (ref 0.61–1.24)
GFR calc Af Amer: 38 mL/min — ABNORMAL LOW (ref >60)
GFR calc non Af Amer: 33 mL/min — ABNORMAL LOW (ref >60)
Glucose, Bld: 91 mg/dL (ref 70–99)
Potassium: 4.9 mmol/L (ref 3.5–5.1)
Sodium: 140 mmol/L (ref 135–145)

## 2018-09-08 LAB — CBC
HCT: 32.5 % — ABNORMAL LOW (ref 39.0–52.0)
Hemoglobin: 11.5 g/dL — ABNORMAL LOW (ref 13.0–17.0)
MCH: 33.3 pg (ref 26.0–34.0)
MCHC: 35.4 g/dL (ref 30.0–36.0)
MCV: 94.2 fL (ref 80.0–100.0)
Platelets: 98 10*3/uL — ABNORMAL LOW (ref 150–400)
RBC: 3.45 MIL/uL — ABNORMAL LOW (ref 4.22–5.81)
RDW: 12.8 % (ref 11.5–15.5)
WBC: 8.5 10*3/uL (ref 4.0–10.5)
nRBC: 0 % (ref 0.0–0.2)

## 2018-09-08 NOTE — Care Management Obs Status (Signed)
[facility] NOTIFICATION   Patient Details  Name: [name] [name] MRN: [id number] Date of Birth: 09/09/1933   Medicare Observation Status Notification Given:  Yes    Venus Ruhe A Laiyla Slagel, RN 09/08/2018, 8:29 AM

## 2018-09-08 NOTE — Progress Notes (Signed)
Patient is being discharge to home this afternoon. IV removed. DC & Rx instructions given and patient acknowledged understanding. No questions.  Patient will call for ride home

## 2018-09-08 NOTE — Plan of Care (Signed)
  Problem: Nutrition: Goal: Adequate nutrition will be maintained Outcome: Progressing   Problem: Coping: Goal: Level of anxiety will decrease Outcome: Completed/Met   Problem: Elimination: Goal: Will not experience complications related to bowel motility Outcome: Completed/Met   Problem: Pain Managment: Goal: General experience of comfort will improve Outcome: Progressing   Problem: Safety: Goal: Ability to remain free from injury will improve Outcome: Progressing

## 2018-09-08 NOTE — Discharge Summary (Signed)
[place] [facility] [place] NAME: [name] [name]    MR#:  [id number]  DATE OF BIRTH:  May 02, 1933  DATE OF ADMISSION:  09/07/2018   ADMITTING PHYSICIAN: Lavonia Drafts, MD  DATE OF DISCHARGE: 09/08/2018  PRIMARY CARE PHYSICIAN: [name], MD   ADMISSION DIAGNOSIS:  Nausea and vomiting, intractability of vomiting not specified, unspecified vomiting type [R11.2] Left lower quadrant abdominal pain [R10.32] DISCHARGE DIAGNOSIS:  Active Problems:   AGE (acute gastroenteritis)  SECONDARY DIAGNOSIS:   Past Medical History:  Diagnosis Date  . BPH (benign prostatic hyperplasia)   . Cancer ([place])    skin  . Cataract   . Coronary artery disease    a. 4v CABG 01/2005; b. cath 06/05/13 showed patent grafts: LIMA-LAD, VG-D, VG-OM3, VG-dRCA, nl filling pressures, nl LV fxn  . Diastolic dysfunction    a. echo 06/979: nl systolic fxn, mild LVH, diastolic relaxation abnormality, mildly enlarged LA, mild Ao insufficiency  . Dyspnea    with exertion  . ED (erectile dysfunction)   . GERD (gastroesophageal reflux disease)   . Hyperlipidemia   . Hypertension   . IDDM (insulin dependent diabetes mellitus) ([place]) 2000  . Osteoarthritis   . Perirectal fistula   . Pneumonia 12/2016  . Tubular adenoma of colon 07/2012  . Vertigo    HOSPITAL COURSE:  1.  Acute gastroenteritis CT of abdomen:  No acute abnormalities identified to explain the patient's symptoms. The patient has been treated with IV fluid, antiemetic and analgesic. Symptoms has much improved.  2.  Shingles-active exacerbation - Patient recently completed course of Valtrex - Recently started Neurontin however he feels this may be contributing to his nausea.  Hold Neurontin for now. - Lesions appear to be healing at this time with no evidence of infection He is treated associated pain with analgesic.  3.    ARF on CKD stage III due to dehydration Patient is treated with IV fluid support.   Follow-up BMP as outpatient.  4.  Diabetes mellitus - Moderate sliding scale insulin -Hemoglobin A1c7.2.  Resume home medication after discharge. DISCHARGE CONDITIONS:  Stable, discharge to home today. CONSULTS OBTAINED:   DRUG ALLERGIES:   Allergies  Allergen Reactions  . Cortisone Other (See Comments)    Reaction: Leg Cramps  . Metformin Diarrhea  . Metformin And Related Diarrhea   DISCHARGE MEDICATIONS:   Allergies as of 09/08/2018      Reactions   Cortisone Other (See Comments)   Reaction: Leg Cramps   Metformin Diarrhea   Metformin And Related Diarrhea      Medication List    STOP taking these medications   furosemide 20 MG tablet Commonly known as: LASIX   gabapentin 100 MG capsule Commonly known as: NEURONTIN   lidocaine 5 % Commonly known as: Lidoderm   ramipril 10 MG capsule Commonly known as: ALTACE   sildenafil 20 MG tablet Commonly known as: REVATIO     TAKE these medications   acetaminophen 500 MG tablet Commonly known as: TYLENOL Take 1,000 mg by mouth 2 (two) times daily.   amLODipine 5 MG tablet Commonly known as: NORVASC TAKE ONE TABLET TWICE DAILY What changed: when to take this   aspirin EC 81 MG tablet Take 81 mg by mouth daily.   BD Insulin Syringe Ultrafine 31G X 15/64" 0.3 ML Misc Generic drug: Insulin Syringe-Needle U-100 USE AS DIRECTED   blood glucose meter kit and supplies Kit Civil engineer, contracting w/ Device Kit [employer]  Verio VI STRP OneTouch Delica Lancet Dev misc OneTouch Delica Lancets 34L or Fine  Please check CBGs fasting and 2 other times during the day  Dx Code E11.8   carvedilol 12.5 MG tablet Commonly known as: COREG TAKE 1 TABLET BY MOUTH TWICE DAILY What changed: when to take this   finasteride 5 MG tablet Commonly known as: PROSCAR Take 1 tablet (5 mg total) by mouth daily. What changed: when to take this   Insulin Pen Needle 32G X 4 MM Misc Commonly known as: BD Pen Needle Nano U/F USE AS DIRECTED    omeprazole 20 MG capsule Commonly known as: PRILOSEC TAKE 1 CAPSULE BY MOUTH TWICE DAILY What changed: when to take this   oxyCODONE-acetaminophen 5-325 MG tablet Commonly known as: Percocet Take 1 tablet by mouth every 6 (six) hours as needed for severe pain.   potassium chloride 10 MEQ tablet Commonly known as: K-DUR Take 1 tablet (10 meq) by mouth twice daily 6 days a week What changed:   how much to take  how to take this  when to take this  additional instructions   rosuvastatin 20 MG tablet Commonly known as: Crestor Take 1 tablet (20 mg total) by mouth daily. What changed: when to take this   Toujeo SoloStar 300 UNIT/ML Sopn Generic drug: Insulin Glargine (1 Unit Dial) INJECT 50-60 UNITS INTO THE SKIN DAILY .INCREASE BY 1 UNITS/DAY UNTILFBS ARE BETWEEN 80-130. DO NOT EXCEED 60 UNITS PER DAY. What changed: See the new instructions.   traZODone 50 MG tablet Commonly known as: DESYREL TAKE ONE TO TWO TABLETS BY MOUTH AT BEDTIME AS NEEDED FOR SLEEP What changed: See the new instructions.   Vitamin D (Cholecalciferol) 25 MCG (1000 UT) Tabs Take 1,000 Units by mouth daily.        DISCHARGE INSTRUCTIONS:  See AVS.  If you experience worsening of your admission symptoms, develop shortness of breath, life threatening emergency, suicidal or homicidal thoughts you must seek medical attention immediately by calling 911 or calling your MD immediately  if symptoms less severe.  You Must read complete instructions/literature along with all the possible adverse reactions/side effects for all the Medicines you take and that have been prescribed to you. Take any new Medicines after you have completely understood and accpet all the possible adverse reactions/side effects.   Please note  You were cared for by a hospitalist during your hospital stay. If you have any questions about your discharge medications or the care you received while you were in the hospital after you  are discharged, you can call the unit and asked to speak with the hospitalist on call if the hospitalist that took care of you is not available. Once you are discharged, your primary care physician will handle any further medical issues. Please note that NO REFILLS for any discharge medications will be authorized once you are discharged, as it is imperative that you return to your primary care physician (or establish a relationship with a primary care physician if you do not have one) for your aftercare needs so that they can reassess your need for medications and monitor your lab values.    On the day of Discharge:  VITAL SIGNS:  Blood pressure (!) 112/48, pulse (!) 51, temperature 98.6 F (37 C), temperature source Oral, resp. rate 18, SpO2 95 %. PHYSICAL EXAMINATION:  GENERAL:  85 y.o.-year-old patient lying in the bed with no acute distress.  EYES: Pupils equal, round, reactive to light and accommodation. No  scleral icterus. Extraocular muscles intact.  HEENT: Head atraumatic, normocephalic. Oropharynx and nasopharynx clear.  NECK:  Supple, no jugular venous distention. No thyroid enlargement, no tenderness.  LUNGS: Normal breath sounds bilaterally, no wheezing, rales,rhonchi or crepitation. No use of accessory muscles of respiration.  CARDIOVASCULAR: S1, S2 normal. No murmurs, rubs, or gallops.  ABDOMEN: Soft, non-tender, non-distended. Bowel sounds present. No organomegaly or mass.  EXTREMITIES: No pedal edema, cyanosis, or clubbing.  NEUROLOGIC: Cranial nerves II through XII are intact. Muscle strength 5/5 in all extremities. Sensation intact. Gait not checked.  PSYCHIATRIC: The patient is alert and oriented x 3.  SKIN: No obvious rash, lesion, or ulcer.  DATA REVIEW:   CBC Recent Labs  Lab 09/08/18 0508  WBC 8.5  HGB 11.5*  HCT 32.5*  PLT 98*    Chemistries  Recent Labs  Lab 09/07/18 1414 09/08/18 0508  NA 137 140  K 5.1 4.9  CL 109 112*  CO2 20* 22  GLUCOSE 163* 91   BUN 48* 43*  CREATININE 1.82* 1.87*  CALCIUM 9.1 8.6*  AST 27  --   ALT 36  --   ALKPHOS 145*  --   BILITOT 1.1  --      Microbiology Results  Results for orders placed or performed in visit on 12/27/17  Microscopic Examination     Status: None   Collection Time: 12/27/17  9:43 AM   URINE  Result Value Ref Range Status   WBC, UA 0-5 0 - 5 /hpf Final   RBC, UA 0-2 0 - 2 /hpf Final   Epithelial Cells (non renal) 0-10 0 - 10 /hpf Final   Casts None seen None seen /lpf Final   Mucus, UA Present Not Estab. Final   Bacteria, UA Few None seen/Few Final    RADIOLOGY:  Ct Abdomen Pelvis W Contrast  Result Date: 09/07/2018 CLINICAL DATA:  Nausea and vomiting.  Abdominal tenderness. EXAM: CT ABDOMEN AND PELVIS WITH CONTRAST TECHNIQUE: Multidetector CT imaging of the abdomen and pelvis was performed using the standard protocol following bolus administration of intravenous contrast. CONTRAST:  76m OMNIPAQUE IOHEXOL 300 MG/ML  SOLN COMPARISON:  March 09, 2015 FINDINGS: Lower chest: No acute abnormality. Hepatobiliary: The patient is status post cholecystectomy. No liver masses are identified. The common bile duct is patent. Mild prominence of the intra and extrahepatic bile ducts is identified, more prominent since 2017 but mild. No filling defects seen in the common bile duct. Pancreas: Unremarkable. No pancreatic ductal dilatation or surrounding inflammatory changes. Spleen: Normal in size without focal abnormality. Adrenals/Urinary Tract: Adrenal glands are within normal limits. There is an exophytic cyst off the anterior left kidney. No suspicious renal masses. No hydronephrosis or acute perinephric stranding. The ureters are unremarkable. The bladder is normal. Delayed images through the kidneys are unremarkable. Stomach/Bowel: The stomach is poorly distended limiting evaluation. However, given the lack of distension, no acute abnormalities are seen. The small bowel is normal. The colon and  appendix are unremarkable. Vascular/Lymphatic: Atherosclerotic changes are seen in the nonaneurysmal aorta. No adenopathy. Reproductive: The prostate is enlarged measuring 7.2 by 6.0 cm on axial images. Other: No free air or free fluid. Musculoskeletal: The patient is status post right hip replacement. Degenerative changes are seen in the right SI joint in the visualized spine. IMPRESSION: 1. No acute abnormalities identified to explain the patient's symptoms. 2. Mild intra and extrahepatic biliary duct prominence may be due to previous cholecystectomy. Recommend correlation with labs. 3. Atherosclerotic changes in the  aorta. 4. Prostate enlargement. 5. Degenerative changes. Electronically Signed   By: Dorise Bullion III M.D   On: 09/07/2018 16:24     Management plans discussed with the patient, family and they are in agreement.  CODE STATUS: Full Code   TOTAL TIME TAKING CARE OF THIS PATIENT: 26 minutes.    Demetrios Loll M.D on 09/08/2018 at 11:36 AM  Between 7am to 6pm - Pager - [phone]  After 6pm go to [email or web address] - [employer] [facility]  Office  [phone]  CC: Primary care physician; [name], MD   Note: This dictation was prepared with Dragon dictation along with smaller phrase technology. Any transcriptional errors that result from this process are unintentional.

## 2018-09-09 LAB — NOVEL CORONAVIRUS, NAA (HOSP ORDER, SEND-OUT TO REF LAB; TAT 18-24 HRS): SARS-CoV-2, NAA: NOT DETECTED

## 2018-09-10 ENCOUNTER — Telehealth: Payer: Self-pay | Admitting: Family Medicine

## 2018-09-10 ENCOUNTER — Telehealth: Payer: Self-pay | Admitting: Cardiovascular Disease

## 2018-09-10 NOTE — Telephone Encounter (Signed)
First attempt for Transitional Care Management call, left message for patient to [place] office , will continue to monitor and attempt TCM. 

## 2018-09-10 NOTE — Telephone Encounter (Addendum)
The patient was discharged from the hospital on 7.4.2020. Please inform patient this will be a phone visit.

## 2018-09-10 NOTE — Telephone Encounter (Signed)
Pt c/o medication issue:  1. Name of Medication:    Furosemide    Gabapentin    Lidocaine patch   ramipril    sildenafil    2. How are you currently taking this medication (dosage and times per day)? armc d/c these   3. Are you having a reaction (difficulty breathing--STAT)? No   4. What is your medication issue? Patient wants to be sure its ok with dr. Rockey Situ to discontinue these

## 2018-09-11 NOTE — Telephone Encounter (Signed)
Transition Care Management Follow-up Telephone [place]  How have you been since you were released from the hospital? Says the shingles are about to drive him crazy , the pain medication helps but does not totally relieve. Pain scale is about a 5 on 0-10 scale with medication.   Do you understand why you were in the hospital? yes   Do you understand the discharge instrcutions? yes  Items Reviewed:  Medications reviewed: yes  Allergies reviewed: yes  Dietary changes reviewed: yes  Referrals reviewed: yes   Functional Questionnaire:   Activities of Daily Living (ADLs):   He states they are independent in the following: ambulation, bathing and hygiene, feeding, continence, grooming, toileting and dressing States they require assistance with the following: No assistance needed.   Any transportation issues/concerns?: no   Any patient concerns? Yes, wants to know how to get this pain under control so he can go about his activities of daily living.   Confirmed importance and date/time of follow-up visits scheduled: yes   Confirmed with patient if condition begins to worsen call PCP or go to the ER.  Patient was given the Call-a-Nurse line [phone]: yes

## 2018-09-11 NOTE — Telephone Encounter (Signed)
No answer. Left detailed message, ok per DPR. Stated the physician's in the hospital would have made the decisions for the patient based on what was best for him at that time and this should be fine. Advised him to keep follow up with PCP, Dr [name], tomorrow and discuss further with him. Then if needed PCP or patient to reach back out to Dr Rockey Situ or schedule appointment. Advised him to call back if any further questions.

## 2018-09-12 ENCOUNTER — Telehealth: Payer: Self-pay | Admitting: Family Medicine

## 2018-09-12 ENCOUNTER — Ambulatory Visit (INDEPENDENT_AMBULATORY_CARE_PROVIDER_SITE_OTHER): Payer: Medicare Other | Admitting: Family Medicine

## 2018-09-12 ENCOUNTER — Other Ambulatory Visit: Payer: Self-pay

## 2018-09-12 DIAGNOSIS — K529 Noninfective gastroenteritis and colitis, unspecified: Secondary | ICD-10-CM

## 2018-09-12 DIAGNOSIS — I10 Essential (primary) hypertension: Secondary | ICD-10-CM | POA: Diagnosis not present

## 2018-09-12 DIAGNOSIS — K839 Disease of biliary tract, unspecified: Secondary | ICD-10-CM | POA: Insufficient documentation

## 2018-09-12 DIAGNOSIS — B029 Zoster without complications: Secondary | ICD-10-CM

## 2018-09-12 DIAGNOSIS — N189 Chronic kidney disease, unspecified: Secondary | ICD-10-CM

## 2018-09-12 DIAGNOSIS — N179 Acute kidney failure, unspecified: Secondary | ICD-10-CM

## 2018-09-12 MED ORDER — OXYCODONE-ACETAMINOPHEN 5-325 MG PO TABS: 1.0000 | ORAL_TABLET | Freq: Four times a day (QID) | ORAL | 0 refills | Status: DC | PRN

## 2018-09-12 NOTE — Assessment & Plan Note (Signed)
Symptoms have resolved.  He will monitor for recurrence.  I suspect the soreness he has is related to his vomiting.  If this does not improve he will let us know.  If it worsens he will let us know right away.

## 2018-09-12 NOTE — Assessment & Plan Note (Signed)
BP has returned to normal or elevated.  He will go to the medical mall at the hospital to have a CMP completed and if his renal function has improved we will restart his Lasix and ramipril.

## 2018-09-12 NOTE — Assessment & Plan Note (Signed)
This was likely related to his vomiting and poor p.o. intake.  Recheck renal function on lab work today.  Encouraged hydration.

## 2018-09-12 NOTE — Progress Notes (Signed)
Virtual Visit via telephone Note  This visit type was conducted due to national recommendations for restrictions regarding the COVID-19 pandemic (e.g. social distancing).  This format is felt to be most appropriate for this patient at this time.  All issues noted in this document were discussed and addressed.  No physical exam was performed (except for noted visual exam findings with Video Visits).   I connected with [name] [name] today at 11:30 AM EDT by telephone and verified that I am speaking with the correct person using two identifiers. Location patient: home Location provider: home office Persons participating in the virtual visit: patient, provider, Wife  I discussed the limitations, risks, security and privacy concerns of performing an evaluation and management service by telephone and the availability of in person appointments. I also discussed with the patient that there may be a patient responsible charge related to this service. The patient expressed understanding and agreed to proceed.  Interactive audio and video telecommunications were attempted between this provider and patient, however failed, due to patient having technical difficulties OR patient did not have access to video capability.  We continued and completed visit with audio only.   Reason for visit: Hospital follow-up  HPI: Patient was hospitalized from 09/07/2018-09/08/2018.  He was hospitalized for abdominal pain and vomiting.  He was found to have acute gastroenteritis.  He underwent a CT abdomen and pelvis that did not reveal any acute changes though did reveal some mild intrahepatic and extrahepatic biliary duct prominence.  It was recommended to correlate this with LFTs.  His alk phos was minimally elevated in the hospital.  He was found to have AKI and was given IV fluids.  He notes since discharge she has had no additional vomiting.  He had no blood in his vomitus.  He has had no diarrhea.  He is trying to drink  some.  He does note some epigastric soreness that has been going on since he was in the hospital.  That has been stable.  No additional abdominal pain.  They stopped his Lasix and ramipril as well as sildenafil and gabapentin.  His blood pressures were running low in the hospital though have been more elevated into the 150s over 70s at home.  He continues to have issues with shingles and notes his rash is still painful although the rash is drying up and healing some.  It feels like stinging in that area and the Percocet is beneficial.  He has had the symptoms for 3 weeks.  Discharge summary reviewed.  Medications reviewed.   ROS: See pertinent positives and negatives per HPI.  Past Medical History:  Diagnosis Date   BPH (benign prostatic hyperplasia)    Cancer ([place])    skin   Cataract    Coronary artery disease    a. 4v CABG 01/2005; b. cath 06/05/13 showed patent grafts: LIMA-LAD, VG-D, VG-OM3, VG-dRCA, nl filling pressures, nl LV fxn   Diastolic dysfunction    a. echo 03/1655: nl systolic fxn, mild LVH, diastolic relaxation abnormality, mildly enlarged LA, mild Ao insufficiency   Dyspnea    with exertion   ED (erectile dysfunction)    GERD (gastroesophageal reflux disease)    Hyperlipidemia    Hypertension    IDDM (insulin dependent diabetes mellitus) ([place]) 2000   Osteoarthritis    Perirectal fistula    Pneumonia 12/2016   Tubular adenoma of colon 07/2012   Vertigo     Past Surgical History:  Procedure Laterality Date   BACK  SURGERY     CARDIAC CATHETERIZATION  06/05/2013   [facility]    CARDIAC CATHETERIZATION N/A 09/24/2015   Procedure: Right Heart Cath and Coronary/Graft Angiography;  Surgeon: Minna Merritts, MD;  Location: [place] [facility];  Service: Cardiovascular;  Laterality: N/A;   CATARACT EXTRACTION  sept 2013   right   CATARACT EXTRACTION W/PHACO Left 03/28/2017   Procedure: CATARACT EXTRACTION PHACO AND INTRAOCULAR LENS PLACEMENT (IOC);   Surgeon: Birder Robson, MD;  Location: [facility];  Service: Ophthalmology;  Laterality: Left;  [place] 00:42.0 AP% 15.0 CDE 6.31 Fluid Pack lot # [id number]   CHOLECYSTECTOMY  05/15/2011   Procedure: LAPAROSCOPIC CHOLECYSTECTOMY;  Surgeon: Rolm Bookbinder, MD;  Location: [facility];  Service: General;  Laterality: N/A;   COLONOSCOPY W/ POLYPECTOMY     CORONARY ARTERY BYPASS GRAFT  2007   x 4   EYE SURGERY     FOOT SURGERY  2012   right foot   JOINT REPLACEMENT  8/10   Right THR--[name]   LEFT AND RIGHT HEART CATHETERIZATION WITH CORONARY/GRAFT ANGIOGRAM N/A 06/05/2013   Procedure: LEFT AND RIGHT HEART CATHETERIZATION WITH Beatrix Fetters;  Surgeon: Peter M Martinique, MD;  Location: [facility];  Service: Cardiovascular;  Laterality: N/A;   LUMBAR LAMINECTOMY  1989   Prostate photovaporization  5/16   Dr Budd Palmer   RIGHT/LEFT HEART CATH AND CORONARY/GRAFT ANGIOGRAPHY N/A 01/05/2018   Procedure: RIGHT/LEFT HEART CATH AND CORONARY/GRAFT ANGIOGRAPHY;  Surgeon: Minna Merritts, MD;  Location: [place] [facility];  Service: Cardiovascular;  Laterality: N/A;    Family History  Problem Relation Age of Onset   Lung cancer Mother    Heart disease Father    Colon cancer Neg Hx    Breast cancer Neg Hx     SOCIAL HX: Non-smoker.   Current Outpatient Medications:    acetaminophen (TYLENOL) 500 MG tablet, Take 1,000 mg by mouth 2 (two) times daily., Disp: , Rfl:    amLODipine (NORVASC) 5 MG tablet, TAKE ONE TABLET TWICE DAILY (Patient taking differently: Take 5 mg by mouth 2 (two) times a day. ), Disp: 90 tablet, Rfl: 0   aspirin EC 81 MG tablet, Take 81 mg by mouth daily., Disp: , Rfl:    BD INSULIN SYRINGE ULTRAFINE 31G X 15/64" 0.3 ML MISC, USE AS DIRECTED, Disp: 100 each, Rfl: 3   blood glucose meter kit and supplies KIT, OneTouch Verio w/ Device Kit OneTouch Verio VI STRP OneTouch Delica Lancet Dev misc OneTouch Delica Lancets 93G or Fine  Please check CBGs  fasting and 2 other times during the day  Dx Code E11.8, Disp: 1 each, Rfl: 0   carvedilol (COREG) 12.5 MG tablet, TAKE 1 TABLET BY MOUTH TWICE DAILY (Patient taking differently: Take 12.5 mg by mouth 2 (two) times a day. ), Disp: 180 tablet, Rfl: 4   finasteride (PROSCAR) 5 MG tablet, Take 1 tablet (5 mg total) by mouth daily. (Patient taking differently: Take 5 mg by mouth at bedtime. ), Disp: 90 tablet, Rfl: 1   Insulin Pen Needle (BD PEN NEEDLE NANO U/F) 32G X 4 MM MISC, USE AS DIRECTED, Disp: 100 each, Rfl: 11   omeprazole (PRILOSEC) 20 MG capsule, TAKE 1 CAPSULE BY MOUTH TWICE DAILY (Patient taking differently: Take 20 mg by mouth 2 (two) times a day. ), Disp: 60 capsule, Rfl: 2   oxyCODONE-acetaminophen (PERCOCET) 5-325 MG tablet, Take 1 tablet by mouth every 6 (six) hours as needed for severe pain., Disp: 20 tablet, Rfl:  0   potassium chloride (K-DUR) 10 MEQ tablet, Take 1 tablet (10 meq) by mouth twice daily 6 days a week (Patient taking differently: Take 10 mEq by mouth 2 (two) times daily. ), Disp: , Rfl:    rosuvastatin (CRESTOR) 20 MG tablet, Take 1 tablet (20 mg total) by mouth daily. (Patient taking differently: Take 20 mg by mouth at bedtime. ), Disp: 90 tablet, Rfl: 4   TOUJEO SOLOSTAR 300 UNIT/ML SOPN, INJECT 50-60 UNITS INTO THE SKIN DAILY .INCREASE BY 1 UNITS/DAY UNTILFBS ARE BETWEEN 80-130. DO NOT EXCEED 60 UNITS PER DAY. (Patient taking differently: Inject 46-48 Units into the skin daily. ), Disp: 4.5 mL, Rfl: 6   traZODone (DESYREL) 50 MG tablet, TAKE ONE TO TWO TABLETS BY MOUTH AT BEDTIME AS NEEDED FOR SLEEP (Patient taking differently: Take 50-100 mg by mouth at bedtime as needed for sleep. ), Disp: 90 tablet, Rfl: 3   Vitamin D, Cholecalciferol, 1000 units TABS, Take 1,000 Units by mouth daily. , Disp: , Rfl:   EXAM: This was a telehealth telephone visit and thus no physical exam was completed.  ASSESSMENT AND PLAN:  Discussed the following assessment and  plan:  AGE (acute gastroenteritis) Symptoms have resolved.  He will monitor for recurrence.  I suspect the soreness he has is related to his vomiting.  If this does not improve he will let us know.  If it worsens he will let us know right away.  Essential hypertension BP has returned to normal or elevated.  He will go to the medical mall at the hospital to have a CMP completed and if his renal function has improved we will restart his Lasix and ramipril.  Acute on chronic kidney failure (HCC) This was likely related to his vomiting and poor p.o. intake.  Recheck renal function on lab work today.  Encouraged hydration.  Shingles Continue Percocet.  I discussed that the pain may improve as his rash resolves though he could have persistent pain.  If it is not improving over the next 1 to 2 weeks we could consider adding Lyrica.  Bile duct abnormality Prominent bile ducts noted on CT scan.  This is likely related to his prior cholecystectomy though we will recheck liver function testing with lab work.  If there are abnormalities on lab work consider referral to GI for evaluation.    I discussed the assessment and treatment plan with the patient. The patient was provided an opportunity to ask questions and all were answered. The patient agreed with the plan and demonstrated an understanding of the instructions.   The patient was advised to call back or seek an in-person evaluation if the symptoms worsen or if the condition fails to improve as anticipated.  I provided 19 minutes of non-face-to-face time during this encounter.   [name], MD

## 2018-09-12 NOTE — Assessment & Plan Note (Signed)
Continue Percocet.  I discussed that the pain may improve as his rash resolves though he could have persistent pain.  If it is not improving over the next 1 to 2 weeks we could consider adding Lyrica.

## 2018-09-12 NOTE — Assessment & Plan Note (Addendum)
Prominent bile ducts noted on CT scan.  This is likely related to his prior cholecystectomy though we will recheck liver function testing with lab work.  If there are abnormalities on lab work consider referral to GI for evaluation.

## 2018-09-13 ENCOUNTER — Other Ambulatory Visit
Admission: RE | Admit: 2018-09-13 | Discharge: 2018-09-13 | Disposition: A | Discharge status: Home / Self Care | Payer: Medicare Other | Attending: Family Medicine | Admitting: Family Medicine

## 2018-09-13 ENCOUNTER — Other Ambulatory Visit: Payer: Self-pay

## 2018-09-13 DIAGNOSIS — N179 Acute kidney failure, unspecified: Secondary | ICD-10-CM | POA: Insufficient documentation

## 2018-09-13 LAB — COMPREHENSIVE METABOLIC PANEL
ALT: 18 U/L (ref 0–44)
AST: 22 U/L (ref 15–41)
Albumin: 3.8 g/dL (ref 3.5–5.0)
Alkaline Phosphatase: 123 U/L (ref 38–126)
Anion gap: 8 (ref 5–15)
BUN: 24 mg/dL — ABNORMAL HIGH (ref 8–23)
CO2: 21 mmol/L — ABNORMAL LOW (ref 22–32)
Calcium: 8.8 mg/dL — ABNORMAL LOW (ref 8.9–10.3)
Chloride: 109 mmol/L (ref 98–111)
Creatinine, Ser: 1.57 mg/dL — ABNORMAL HIGH (ref 0.61–1.24)
GFR calc Af Amer: 47 mL/min — ABNORMAL LOW (ref >60)
GFR calc non Af Amer: 40 mL/min — ABNORMAL LOW (ref >60)
Glucose, Bld: 276 mg/dL — ABNORMAL HIGH (ref 70–99)
Potassium: 4.5 mmol/L (ref 3.5–5.1)
Sodium: 138 mmol/L (ref 135–145)
Total Bilirubin: 0.9 mg/dL (ref 0.3–1.2)
Total Protein: 6.3 g/dL — ABNORMAL LOW (ref 6.5–8.1)

## 2018-09-13 NOTE — Telephone Encounter (Signed)
Opened in error

## 2018-09-14 ENCOUNTER — Other Ambulatory Visit: Payer: Self-pay | Admitting: Family Medicine

## 2018-09-14 DIAGNOSIS — I10 Essential (primary) hypertension: Secondary | ICD-10-CM

## 2018-09-17 ENCOUNTER — Other Ambulatory Visit (INDEPENDENT_AMBULATORY_CARE_PROVIDER_SITE_OTHER): Payer: Medicare Other

## 2018-09-17 ENCOUNTER — Other Ambulatory Visit: Payer: Self-pay

## 2018-09-17 DIAGNOSIS — I10 Essential (primary) hypertension: Secondary | ICD-10-CM | POA: Diagnosis not present

## 2018-09-17 LAB — BASIC METABOLIC PANEL
BUN: 19 mg/dL (ref 6–23)
CO2: 17 mEq/L — ABNORMAL LOW (ref 19–32)
Calcium: 8.7 mg/dL (ref 8.4–10.5)
Chloride: 110 mEq/L (ref 96–112)
Creatinine, Ser: 1.37 mg/dL (ref 0.40–1.50)
GFR: 49.64 mL/min — ABNORMAL LOW (ref >60.00)
Glucose, Bld: 186 mg/dL — ABNORMAL HIGH (ref 70–99)
Potassium: 4.6 mEq/L (ref 3.5–5.1)
Sodium: 138 mEq/L (ref 135–145)

## 2018-09-18 NOTE — Progress Notes (Signed)
Abstract entry, diabetic retinopathy.  Vishal Sandlin,cma

## 2018-09-19 ENCOUNTER — Encounter: Payer: Self-pay | Admitting: *Deleted

## 2018-09-19 DIAGNOSIS — I208 Other forms of angina pectoris: Secondary | ICD-10-CM

## 2018-09-19 NOTE — Progress Notes (Signed)
Cardiac Individual Treatment Plan  Patient Details  Name: [name] [name] MRN: [id number] Date of Birth: [birth year] Referring Provider:     Cardiac Rehab from 03/19/2018 in [facility] Cardiac and Pulmonary Rehab  Referring Provider  Ida Rogue MD      Initial Encounter Date:    Cardiac Rehab from 03/19/2018 in [facility] Cardiac and Pulmonary Rehab  Date  03/19/18      Visit Diagnosis: Stable angina ([place])  Patient's Home Medications on Admission:  Current Outpatient Medications:  .  acetaminophen (TYLENOL) 500 MG tablet, Take 1,000 mg by mouth 2 (two) times daily., Disp: , Rfl:  .  amLODipine (NORVASC) 5 MG tablet, TAKE ONE TABLET TWICE DAILY (Patient taking differently: Take 5 mg by mouth 2 (two) times a day. ), Disp: 90 tablet, Rfl: 0 .  aspirin EC 81 MG tablet, Take 81 mg by mouth daily., Disp: , Rfl:  .  BD INSULIN SYRINGE ULTRAFINE 31G X 15/64" 0.3 ML MISC, USE AS DIRECTED, Disp: 100 each, Rfl: 3 .  blood glucose meter kit and supplies KIT, OneTouch Verio w/ Device Kit OneTouch Verio VI STRP OneTouch Delica Lancet Dev misc OneTouch Delica Lancets 82X or Fine  Please check CBGs fasting and 2 other times during the day  Dx Code E11.8, Disp: 1 each, Rfl: 0 .  carvedilol (COREG) 12.5 MG tablet, TAKE 1 TABLET BY MOUTH TWICE DAILY (Patient taking differently: Take 12.5 mg by mouth 2 (two) times a day. ), Disp: 180 tablet, Rfl: 4 .  finasteride (PROSCAR) 5 MG tablet, Take 1 tablet (5 mg total) by mouth daily. (Patient taking differently: Take 5 mg by mouth at bedtime. ), Disp: 90 tablet, Rfl: 1 .  Insulin Pen Needle (BD PEN NEEDLE NANO U/F) 32G X 4 MM MISC, USE AS DIRECTED, Disp: 100 each, Rfl: 11 .  omeprazole (PRILOSEC) 20 MG capsule, TAKE 1 CAPSULE BY MOUTH TWICE DAILY (Patient taking differently: Take 20 mg by mouth 2 (two) times a day. ), Disp: 60 capsule, Rfl: 2 .  oxyCODONE-acetaminophen (PERCOCET) 5-325 MG tablet, Take 1 tablet by mouth every 6 (six) hours as needed for severe pain.,  Disp: 20 tablet, Rfl: 0 .  potassium chloride (K-DUR) 10 MEQ tablet, Take 1 tablet (10 meq) by mouth twice daily 6 days a week (Patient taking differently: Take 10 mEq by mouth 2 (two) times daily. ), Disp: , Rfl:  .  rosuvastatin (CRESTOR) 20 MG tablet, Take 1 tablet (20 mg total) by mouth daily. (Patient taking differently: Take 20 mg by mouth at bedtime. ), Disp: 90 tablet, Rfl: 4 .  TOUJEO SOLOSTAR 300 UNIT/ML SOPN, INJECT 50-60 UNITS INTO THE SKIN DAILY .INCREASE BY 1 UNITS/DAY UNTILFBS ARE BETWEEN 80-130. DO NOT EXCEED 60 UNITS PER DAY. (Patient taking differently: Inject 46-48 Units into the skin daily. ), Disp: 4.5 mL, Rfl: 6 .  traZODone (DESYREL) 50 MG tablet, TAKE ONE TO TWO TABLETS BY MOUTH AT BEDTIME AS NEEDED FOR SLEEP (Patient taking differently: Take 50-100 mg by mouth at bedtime as needed for sleep. ), Disp: 90 tablet, Rfl: 3 .  Vitamin D, Cholecalciferol, 1000 units TABS, Take 1,000 Units by mouth daily. , Disp: , Rfl:   Past Medical History: Past Medical History:  Diagnosis Date  . BPH (benign prostatic hyperplasia)   . Cancer ([place])    skin  . Cataract   . Coronary artery disease    a. 4v CABG 01/2005; b. cath 06/05/13 showed patent grafts: LIMA-LAD, VG-D, VG-OM3, VG-dRCA, nl filling  pressures, nl LV fxn  . Diastolic dysfunction    a. echo 04/9796: nl systolic fxn, mild LVH, diastolic relaxation abnormality, mildly enlarged LA, mild Ao insufficiency  . Dyspnea    with exertion  . ED (erectile dysfunction)   . GERD (gastroesophageal reflux disease)   . Hyperlipidemia   . Hypertension   . IDDM (insulin dependent diabetes mellitus) ([place]) 2000  . Osteoarthritis   . Perirectal fistula   . Pneumonia 12/2016  . Tubular adenoma of colon 07/2012  . Vertigo     Tobacco Use: Social History   Tobacco Use  Smoking Status Never Smoker  Smokeless Tobacco Never Used    Labs: Recent Review Flowsheet Data    Labs for ITP Cardiac and Pulmonary Rehab Latest Ref Rng & Units  08/02/2016 11/09/2016 08/18/2017 12/27/2017 09/07/2018   Cholestrol 100 - 199 mg/dL - - 99 125 -   LDLCALC 0 - 99 mg/dL - - 41 - -   LDLDIRECT mg/dL - - - - -   HDL >39.00 mg/dL - - 31.00(L) - -   Trlycerides 0 - 149 mg/dL - - 137.0 289(H) -   Hemoglobin A1c 4.8 - 5.6 % 8.4 8.3 7.7(H) 6.9(H) 7.2(H)   PHART 7.350 - 7.450 - - - - -   PCO2ART 35.0 - 45.0 mmHg - - - - -   HCO3 20.0 - 24.0 mEq/L - - - - -   TCO2 0 - 100 mmol/L - - - - -   O2SAT % - - - - -       Exercise Target Goals: Exercise Program Goal: Individual exercise prescription set using results from initial 6 min walk test and THRR while considering  patient's activity barriers and safety.   Exercise Prescription Goal: Initial exercise prescription builds to 30-45 minutes a day of aerobic activity, 2-3 days per week.  Home exercise guidelines will be given to patient during program as part of exercise prescription that the participant will acknowledge.  Activity Barriers & Risk Stratification:   6 Minute Walk:   Oxygen Initial Assessment:   Oxygen Re-Evaluation:   Oxygen Discharge (Final Oxygen Re-Evaluation):   Initial Exercise Prescription:   Perform Capillary Blood Glucose checks as needed.  Exercise Prescription Changes: Exercise Prescription Changes    Row Name 03/29/18 0800 04/26/18 0800 05/10/18 0900         Response to Exercise   Blood Pressure (Admit)  134/64  130/66  110/58     Blood Pressure (Exercise)  142/64  148/66  152/68     Blood Pressure (Exit)  132/60  122/60  132/60     Heart Rate (Admit)  70 bpm  70 bpm  63 bpm     Heart Rate (Exercise)  75 bpm  87 bpm  69 bpm     Heart Rate (Exit)  68 bpm  61 bpm  58 bpm     Rating of Perceived Exertion (Exercise)  11  11  11      Comments  second day  first day back since 1/30  -     Duration  Progress to 45 minutes of aerobic exercise without signs/symptoms of physical distress  Progress to 45 minutes of aerobic exercise without signs/symptoms of  physical distress  Progress to 45 minutes of aerobic exercise without signs/symptoms of physical distress     Intensity  THRR unchanged  THRR unchanged  Other (comment) use RPE       Progression   Progression  Continue to  progress workloads to maintain intensity without signs/symptoms of physical distress.  Continue to progress workloads to maintain intensity without signs/symptoms of physical distress.  Continue to progress workloads to maintain intensity without signs/symptoms of physical distress.     Average METs  2.5  3.13  4       Resistance Training   Training Prescription  Yes  Yes  Yes     Weight  3 lb  3 lb  3 lb     Reps  10-15  10-15  10-15       Interval Training   Interval Training  No  No  No       Treadmill   MPH  -  1.5  -     Grade  -  0.5  -     Minutes  -  15  -     METs  -  2.25  -       NuStep   Level  -  -  4     SPM  -  -  80     Minutes  -  -  15     METs  -  -  3.6       REL-XR   Level  1  1  1      Speed  50  50  50     Minutes  15  15  15      METs  2.6  4.4  4.4        Exercise Comments: Exercise Comments    Row Name 03/26/18 1729           Exercise Comments  First full day of exercise!  Patient was oriented to gym and equipment including functions, settings, policies, and procedures.  Patient's individual exercise prescription and treatment plan were reviewed.  All starting workloads were established based on the results of the 6 minute walk test done at initial orientation visit.  The plan for exercise progression was also introduced and progression will be customized based on patient's performance and goals.          Exercise Goals and Review:   Exercise Goals Re-Evaluation : Exercise Goals Re-Evaluation    Row Name 03/26/18 1729 04/26/18 0816 05/10/18 0914 05/22/18 1346       Exercise Goal Re-Evaluation   Exercise Goals Review  Increase Physical Activity;Able to understand and use rate of perceived exertion (RPE) scale;Knowledge  and understanding of Target Heart Rate Range (THRR);Understanding of Exercise Prescription;Increase Strength and Stamina;Able to understand and use Dyspnea scale  Increase Physical Activity;Increase Strength and Stamina;Able to understand and use rate of perceived exertion (RPE) scale;Knowledge and understanding of Target Heart Rate Range (THRR);Understanding of Exercise Prescription  Increase Physical Activity;Increase Strength and Stamina;Able to understand and use rate of perceived exertion (RPE) scale;Able to understand and use Dyspnea scale;Knowledge and understanding of Target Heart Rate Range (THRR)  Increase Physical Activity;Increase Strength and Stamina;Able to understand and use rate of perceived exertion (RPE) scale;Knowledge and understanding of Target Heart Rate Range (THRR);Able to check pulse independently    Comments  Reviewed RPE scale, THR and program prescription with pt today.  Pt voiced understanding and was given a copy of goals to take home.   Today is [name]'s first day back this month due to illness and vertigo.  Staff will monitor progress.  [name] was out quite a bit with sickness in February.  Staff will monitor progress as he gets back to a  regular routine.  Our programs are currently closed due to COVID 19.  Staff are following up with patients via email and phone calls.    Expected Outcomes  Short: Use RPE daily to regulate intensity. Long: Follow program prescription in THR.  Short - attend class consistently Long - Increase MET level  Short - attend class consistently Long - improve overall MET level  -       Discharge Exercise Prescription (Final Exercise Prescription Changes): Exercise Prescription Changes - 05/10/18 0900      Response to Exercise   Blood Pressure (Admit)  110/58    Blood Pressure (Exercise)  152/68    Blood Pressure (Exit)  132/60    Heart Rate (Admit)  63 bpm    Heart Rate (Exercise)  69 bpm    Heart Rate (Exit)  58 bpm    Rating of Perceived Exertion  (Exercise)  11    Duration  Progress to 45 minutes of aerobic exercise without signs/symptoms of physical distress    Intensity  Other (comment)   use RPE     Progression   Progression  Continue to progress workloads to maintain intensity without signs/symptoms of physical distress.    Average METs  4      Resistance Training   Training Prescription  Yes    Weight  3 lb    Reps  10-15      Interval Training   Interval Training  No      NuStep   Level  4    SPM  80    Minutes  15    METs  3.6      REL-XR   Level  1    Speed  50    Minutes  15    METs  4.4       Nutrition:  Target Goals: Understanding of nutrition guidelines, daily intake of sodium <1566m, cholesterol <2031m calories 30% from fat and 7% or less from saturated fats, daily to have 5 or more servings of fruits and vegetables.  Biometrics:    Nutrition Therapy Plan and Nutrition Goals:   Nutrition Assessments:   Nutrition Goals Re-Evaluation:   Nutrition Goals Discharge (Final Nutrition Goals Re-Evaluation):   Psychosocial: Target Goals: Acknowledge presence or absence of significant depression and/or stress, maximize coping skills, provide positive support system. Participant is able to verbalize types and ability to use techniques and skills needed for reducing stress and depression.   Initial Review & Psychosocial Screening:   Quality of Life Scores:   Scores of 19 and below usually indicate a poorer quality of life in these areas.  A difference of  2-3 points is a clinically meaningful difference.  A difference of 2-3 points in the total score of the Quality of Life Index has been associated with significant improvement in overall quality of life, self-image, physical symptoms, and general health in studies assessing change in quality of life.  PHQ-9: Recent Review Flowsheet Data    Depression screen [facility]/9 03/19/2018 03/05/2018 12/26/2017 05/06/2016 10/05/2015   Decreased Interest 0 0 0 0  0   Down, Depressed, Hopeless 0 0 0 0 0   PHQ - 2 Score 0 0 0 0 0   Altered sleeping 1 - - 2 1   Tired, decreased energy 1 - - 2 2   Change in appetite 1 - - 1 2   Feeling bad or failure about yourself  0 - - 0 0   Trouble concentrating  0 - - 0 0   Moving slowly or fidgety/restless 0 - - 0 0   Suicidal thoughts 0 - - 0 0   PHQ-9 Score 3 - - 5 5   Difficult doing work/chores - - - Not difficult at all Not difficult at all     Interpretation of Total Score  Total Score Depression Severity:  1-4 = Minimal depression, 5-9 = Mild depression, 10-14 = Moderate depression, 15-19 = Moderately severe depression, 20-27 = Severe depression   Psychosocial Evaluation and Intervention: Psychosocial Evaluation - 03/28/18 1715      Psychosocial Evaluation & Interventions   Interventions  Encouraged to exercise with the program and follow exercise prescription    Comments  Counselor met with Mr. Belloso [facility]) today as he has returned to this program after more than a year of being out.  This initial psychosocial evaluation indicates an 85 year old who has a history of heart disease - including CABGx5 13 years ago.  He has a strong support system with a spouse of 10 years; (4) adult children and active involvement in his local church.  [name] also struggles with type 2 diabetes.  He reports not sleeping well but gets about 6-8 hours of interrupted sleep and his appetite is not very good - but he is not losing weight at this time.  He denies a history of depression or anxiety or any current symptoms and is typically in a positive mood most of the time.  He has goals to breathe better; increase his stamina and maybe lose some weight this time while in this program.  Staff will follow with him.     Expected Outcomes  Short:  [name] will exercise to increase his ability to breathe and improve his stamina.  He will meet with the dietician to address his weight loss goals.  Long:  [name] will exercise consistently for his  health and for weight loss.      Continue Psychosocial Services   Follow up required by staff       Psychosocial Re-Evaluation: Psychosocial Re-Evaluation    [place] Name 05/25/18 1023             Psychosocial Re-Evaluation   Current issues with  Current Stress Concerns;Current Sleep Concerns       Comments  [name] says he has no new stress and is sleeping about the same as before.  he is staying busy at home with current situation.       Expected Outcomes  Short - maintain positve mental outlook Long - continue to manage stress successfully          Psychosocial Discharge (Final Psychosocial Re-Evaluation): Psychosocial Re-Evaluation - 05/25/18 1023      Psychosocial Re-Evaluation   Current issues with  Current Stress Concerns;Current Sleep Concerns    Comments  [name] says he has no new stress and is sleeping about the same as before.  he is staying busy at home with current situation.    Expected Outcomes  Short - maintain positve mental outlook Long - continue to manage stress successfully       Vocational Rehabilitation: Provide vocational rehab assistance to qualifying candidates.   Vocational Rehab Evaluation & Intervention:   Education: Education Goals: Education classes will be provided on a variety of topics geared toward better understanding of heart health and risk factor modification. Participant will state understanding/return demonstration of topics presented as noted by education test scores.  Learning Barriers/Preferences:   Education Topics:  AED/CPR: - Group verbal and written instruction with the use of models to demonstrate the basic use of the AED with the basic ABC's of resuscitation.   Cardiac Rehab from 05/09/2018 in [facility] Cardiac and Pulmonary Rehab  Date  04/30/18  Educator  CE  Instruction Review Code  1- Verbalizes Understanding      General Nutrition Guidelines/Fats and Fiber: -Group instruction provided by verbal, written material, models and  posters to present the general guidelines for heart healthy nutrition. Gives an explanation and review of dietary fats and fiber.   Controlling Sodium/Reading Food Labels: -Group verbal and written material supporting the discussion of sodium use in heart healthy nutrition. Review and explanation with models, verbal and written materials for utilization of the food label.   Exercise Physiology & General Exercise Guidelines: - Group verbal and written instruction with models to review the exercise physiology of the cardiovascular system and associated critical values. Provides general exercise guidelines with specific guidelines to those with heart or lung disease.    Cardiac Rehab from 05/09/2018 in [facility] Cardiac and Pulmonary Rehab  Date  03/26/18  Educator  [facility]  Instruction Review Code  1- Verbalizes Understanding      Aerobic Exercise & Resistance Training: - Gives group verbal and written instruction on the various components of exercise. Focuses on aerobic and resistive training programs and the benefits of this training and how to safely progress through these programs..   Cardiac Rehab from 05/09/2018 in [facility] Cardiac and Pulmonary Rehab  Date  03/28/18  Educator  AS  Instruction Review Code  1- Verbalizes Understanding      Flexibility, Balance, Mind/Body Relaxation: Provides group verbal/written instruction on the benefits of flexibility and balance training, including mind/body exercise modes such as yoga, pilates and tai chi.  Demonstration and skill practice provided.   Cardiac Rehab from 05/09/2018 in [facility] Cardiac and Pulmonary Rehab  Date  04/02/18  Educator  AS  Instruction Review Code  1- Verbalizes Understanding      Stress and Anxiety: - Provides group verbal and written instruction about the health risks of elevated stress and causes of high stress.  Discuss the correlation between heart/lung disease and anxiety and treatment options. Review healthy ways to manage with  stress and anxiety.   Cardiac Rehab from 05/09/2018 in [facility] Cardiac and Pulmonary Rehab  Date  04/25/18  Educator  [facility]  Instruction Review Code  1- Verbalizes Understanding      Depression: - Provides group verbal and written instruction on the correlation between heart/lung disease and depressed mood, treatment options, and the stigmas associated with seeking treatment.   Cardiac Rehab from 05/09/2018 in [facility] Cardiac and Pulmonary Rehab  Date  05/09/18  Educator  KG  Instruction Review Code  1- Verbalizes Understanding      Anatomy & Physiology of the Heart: - Group verbal and written instruction and models provide basic cardiac anatomy and physiology, with the coronary electrical and arterial systems. Review of Valvular disease and Heart Failure   Cardiac Rehab from 04/18/2016 in [facility] Cardiac and Pulmonary Rehab  Date  02/08/16  Educator  SB  Instruction Review Code (retired)  2- meets goals/outcomes      Cardiac Procedures: - Group verbal and written instruction to review commonly prescribed medications for heart disease. Reviews the medication, class of the drug, and side effects. Includes the steps to properly store meds and maintain the prescription regimen. (beta blockers and nitrates)   Cardiac Rehab from 04/18/2016 in [facility] Cardiac  and Pulmonary Rehab  Date  04/18/16  Educator  CE  Instruction Review Code (retired)  2- meets goals/outcomes      Cardiac Medications I: - Group verbal and written instruction to review commonly prescribed medications for heart disease. Reviews the medication, class of the drug, and side effects. Includes the steps to properly store meds and maintain the prescription regimen.   Cardiac Medications II: -Group verbal and written instruction to review commonly prescribed medications for heart disease. Reviews the medication, class of the drug, and side effects. (all other drug classes)   Cardiac Rehab from 05/09/2018 in [facility] Cardiac and Pulmonary  Rehab  Date  04/04/18  Educator  [facility]  Instruction Review Code  1- Verbalizes Understanding       Go Sex-Intimacy & Heart Disease, Get SMART - Goal Setting: - Group verbal and written instruction through game format to discuss heart disease and the return to sexual intimacy. Provides group verbal and written material to discuss and apply goal setting through the application of the S.M.A.R.T. Method.   Cardiac Rehab from 04/18/2016 in [facility] Cardiac and Pulmonary Rehab  Date  04/18/16  Educator  CE  Instruction Review Code (retired)  2- meets goals/outcomes      Other Matters of the Heart: - Provides group verbal, written materials and models to describe Stable Angina and Peripheral Artery. Includes description of the disease process and treatment options available to the cardiac patient.   Cardiac Rehab from 04/18/2016 in [facility] Cardiac and Pulmonary Rehab  Date  04/11/16  Educator  C. Enterkin, RN  Instruction Review Code (retired)  2- meets goals/outcomes      Exercise & Equipment Safety: - Individual verbal instruction and demonstration of equipment use and safety with use of the equipment.   Cardiac Rehab from 05/09/2018 in [facility] Cardiac and Pulmonary Rehab  Date  03/19/18  Educator  SB  Instruction Review Code  1- Verbalizes Understanding      Infection Prevention: - Provides verbal and written material to individual with discussion of infection control including proper hand washing and proper equipment cleaning during exercise session.   Cardiac Rehab from 05/09/2018 in [facility] Cardiac and Pulmonary Rehab  Date  03/19/18  Educator  SB  Instruction Review Code  1- Verbalizes Understanding      Falls Prevention: - Provides verbal and written material to individual with discussion of falls prevention and safety.   Cardiac Rehab from 05/09/2018 in [facility] Cardiac and Pulmonary Rehab  Date  03/19/18  Educator  SB  Instruction Review Code  1- Verbalizes Understanding      Diabetes: -  Individual verbal and written instruction to review signs/symptoms of diabetes, desired ranges of glucose level fasting, after meals and with exercise. Acknowledge that pre and post exercise glucose checks will be done for 3 sessions at entry of program.   Cardiac Rehab from 05/09/2018 in [facility] Cardiac and Pulmonary Rehab  Date  03/19/18  Educator  SB  Instruction Review Code  1- Verbalizes Understanding      Know Your Numbers and Risk Factors: -Group verbal and written instruction about important numbers in your health.  Discussion of what are risk factors and how they play a role in the disease process.  Review of Cholesterol, Blood Pressure, Diabetes, and BMI and the role they play in your overall health.   Cardiac Rehab from 05/09/2018 in [facility] Cardiac and Pulmonary Rehab  Date  04/04/18  Educator  [facility]  Instruction Review Code  1- Verbalizes Understanding  Sleep Hygiene: -Provides group verbal and written instruction about how sleep can affect your health.  Define sleep hygiene, discuss sleep cycles and impact of sleep habits. Review good sleep hygiene tips.    Other: -Provides group and verbal instruction on various topics (see comments)   Knowledge Questionnaire Score:   Core Components/Risk Factors/Patient Goals at Admission:   Core Components/Risk Factors/Patient Goals Review:  Goals and Risk Factor Review    Row Name 05/25/18 1020 06/15/18 1144 08/09/18 1653         Core Components/Risk Factors/Patient Goals Review   Personal Goals Review  Weight Management/Obesity;Diabetes;Heart Failure;Hypertension;Lipids  Weight Management/Obesity;Diabetes;Heart Failure;Hypertension;Stress  Weight Management/Obesity;Heart Failure;Diabetes;Hypertension;Other     Review  [name] is getting exercise riding his bike and doing spring cleaning.  He is monitoring his BG - 111 today.  he is still eating healthy.    [name] continues to ride his bike at home.  He is taking meds as directed.  He feels  he is geeting good rest and lots of "porch time".  [name] has been monitoring BG and it has been 90-91 fasting.  He has talked to his Dr.  and has lowered insulin.  He is starting an injection (cant remember name ) that will allow him to reduce insulin usage.  His Dr also gave him a low BG kit should he get too low.  He still rides his bike when its not too hot.  He has not checked email but will to see if he wants to use the app.     Expected Outcomes  Short - Continue to exercise at home and eat heart healthy diet Long - maintain exercise on his own  Short - continue exercise at home Long - maintain heart healthy habits  Short - continue to monitor vitals at home Long - control risk factors independently        Core Components/Risk Factors/Patient Goals at Discharge (Final Review):  Goals and Risk Factor Review - 08/09/18 1653      Core Components/Risk Factors/Patient Goals Review   Personal Goals Review  Weight Management/Obesity;Heart Failure;Diabetes;Hypertension;Other    Review  [name] has been monitoring BG and it has been 90-91 fasting.  He has talked to his Dr.  and has lowered insulin.  He is starting an injection (cant remember name ) that will allow him to reduce insulin usage.  His Dr also gave him a low BG kit should he get too low.  He still rides his bike when its not too hot.  He has not checked email but will to see if he wants to use the app.    Expected Outcomes  Short - continue to monitor vitals at home Long - control risk factors independently       ITP Comments: ITP Comments    Row Name 04/04/18 1411 04/25/18 1819 05/02/18 0627 05/22/18 1347 05/30/18 1335   ITP Comments  30 day review completed. ITP sent to Dr. Emily Filbert, Medical Director of Cardiac Rehab. Continue with ITP unless changes are made by physician.  Today is Mels first day back afte rbeing out due to illness and vertigo.  30 day review. Continue with ITP unless directed changes by Medical Director chart review.  Our  programs are currently closed due to COVID 19.  Staff are following up with patients via email and phone calls.  30 day review completed. Continue with ITP unless directed changes by Medical Director at review   [place] Name 07/10/18 1037 07/26/18 1553 09/19/18  1331       ITP Comments  [name] had a virtual visit with cardiologist yesterday.  He has been having SOB that has been worsening.  His Dr added viagra to help with this but hasnt started taking yet.  He has had some back and neck pain due to arthritis.  He has been taking muscle relaxer and topical cream.  This has helped reduce pain.    [name] states he hasnt noticed a big change in SOB since adding the meds.  He hasnt been able to exercise as much since it has been raining.  He is willing to use the app for HT until we can meet in person.  30 day review cycle restarting  after being closed since March 16 because of  Covid 19 pandemic. Program opened to patients on July 6. Not all have returned. ITP updated and sent to Medical Director for review,changes as needed and signature        Comments:

## 2018-09-20 ENCOUNTER — Encounter: Payer: Self-pay | Admitting: Family Medicine

## 2018-09-20 DIAGNOSIS — E11319 Type 2 diabetes mellitus with unspecified diabetic retinopathy without macular edema: Secondary | ICD-10-CM | POA: Insufficient documentation

## 2018-09-21 ENCOUNTER — Inpatient Hospital Stay: Payer: Medicare Other

## 2018-09-21 ENCOUNTER — Inpatient Hospital Stay: Payer: Medicare Other | Attending: Oncology | Admitting: Oncology

## 2018-09-26 ENCOUNTER — Other Ambulatory Visit: Payer: Self-pay | Admitting: Family Medicine

## 2018-10-05 ENCOUNTER — Encounter: Payer: Self-pay | Admitting: *Deleted

## 2018-10-05 NOTE — Progress Notes (Signed)
Phone call to see if continue Virtual, start back on site or discharge.  LMOM

## 2018-10-07 DIAGNOSIS — Z8619 Personal history of other infectious and parasitic diseases: Secondary | ICD-10-CM | POA: Diagnosis not present

## 2018-10-07 DIAGNOSIS — S80822A Blister (nonthermal), left lower leg, initial encounter: Secondary | ICD-10-CM | POA: Diagnosis not present

## 2018-10-10 ENCOUNTER — Ambulatory Visit (INDEPENDENT_AMBULATORY_CARE_PROVIDER_SITE_OTHER): Payer: Medicare Other | Admitting: Internal Medicine

## 2018-10-10 ENCOUNTER — Other Ambulatory Visit: Payer: Self-pay

## 2018-10-10 DIAGNOSIS — M5416 Radiculopathy, lumbar region: Secondary | ICD-10-CM | POA: Diagnosis not present

## 2018-10-10 DIAGNOSIS — Z9889 Other specified postprocedural states: Secondary | ICD-10-CM | POA: Diagnosis not present

## 2018-10-10 DIAGNOSIS — M25551 Pain in right hip: Secondary | ICD-10-CM

## 2018-10-10 DIAGNOSIS — R29898 Other symptoms and signs involving the musculoskeletal system: Secondary | ICD-10-CM | POA: Diagnosis not present

## 2018-10-10 DIAGNOSIS — T148XXA Other injury of unspecified body region, initial encounter: Secondary | ICD-10-CM

## 2018-10-10 DIAGNOSIS — R269 Unspecified abnormalities of gait and mobility: Secondary | ICD-10-CM | POA: Diagnosis not present

## 2018-10-10 DIAGNOSIS — N400 Enlarged prostate without lower urinary tract symptoms: Secondary | ICD-10-CM

## 2018-10-10 DIAGNOSIS — G4700 Insomnia, unspecified: Secondary | ICD-10-CM

## 2018-10-10 DIAGNOSIS — B029 Zoster without complications: Secondary | ICD-10-CM

## 2018-10-10 MED ORDER — OXYCODONE-ACETAMINOPHEN 5-325 MG PO TABS: 1.0000 | ORAL_TABLET | Freq: Three times a day (TID) | ORAL | 0 refills | Status: DC | PRN

## 2018-10-10 MED ORDER — ZOLPIDEM TARTRATE 5 MG PO TABS: 5.0000 mg | ORAL_TABLET | Freq: Every evening | ORAL | 0 refills | Status: DC | PRN

## 2018-10-10 MED ORDER — GABAPENTIN 100 MG PO CAPS: 100.0000 mg | ORAL_CAPSULE | Freq: Three times a day (TID) | ORAL | 0 refills | Status: DC

## 2018-10-10 NOTE — Progress Notes (Signed)
Virtual Visit via Video Note  I connected with [name] [name]   on 10/10/18 at 11:30 AM EDT by a video enabled telemedicine application and verified that I am speaking with the correct person using two identifiers.  Location patient: home Location provider:work or home office Persons participating in the virtual visit: patient, provider, pts wife [name] [name]  I discussed the limitations of evaluation and management by telemedicine and the availability of in person appointments. The patient expressed understanding and agreed to proceed.   HPI: 1. C/o chronic back pain daily since 1989 s/p L4 laminectomy in [place]. Back pain is worse w/in the last week with pain radiating to right hip, knee and ankle and right leg weakness. Pain is severe 10/10 affecting gait he is having to walk with a cane with trouble. He had percocet left over from shingles and took 1/2 pill last night which helped a little. He has had little sleep 2 hrs due to pain and trazadone 50-100 mg qhs is not helping. He denies BB incontinence, saddle anesthesia  2. H/o shingles 09/12/18 on right breast and right arm and back of shoulders areas dried now just red he is taking gabapentin 100 tid  3.insomnia trazadone 50-100 mg qhs I snot helping wants to try something else  4. C/l left lower extremity blister new and draining clear fluid this is 2nd time happened in same location in 1 year    ROS: See pertinent positives and negatives per HPI.  Past Medical History:  Diagnosis Date  . BPH (benign prostatic hyperplasia)   . Cancer ([place])    skin  . Cataract   . Coronary artery disease    a. 4v CABG 01/2005; b. cath 06/05/13 showed patent grafts: LIMA-LAD, VG-D, VG-OM3, VG-dRCA, nl filling pressures, nl LV fxn  . Diastolic dysfunction    a. echo 06/345: nl systolic fxn, mild LVH, diastolic relaxation abnormality, mildly enlarged LA, mild Ao insufficiency  . Dyspnea    with exertion  . ED (erectile dysfunction)   . GERD  (gastroesophageal reflux disease)   . Hyperlipidemia   . Hypertension   . IDDM (insulin dependent diabetes mellitus) ([place]) 2000  . Osteoarthritis   . Perirectal fistula   . Pneumonia 12/2016  . Tubular adenoma of colon 07/2012  . Vertigo     Past Surgical History:  Procedure Laterality Date  . BACK SURGERY    . CARDIAC CATHETERIZATION  06/05/2013   [facility]   Marland Kitchen CARDIAC CATHETERIZATION N/A 09/24/2015   Procedure: Right Heart Cath and Coronary/Graft Angiography;  Surgeon: Minna Merritts, MD;  Location: [facility];  Service: Cardiovascular;  Laterality: N/A;  . CATARACT EXTRACTION  sept 2013   right  . CATARACT EXTRACTION W/PHACO Left 03/28/2017   Procedure: CATARACT EXTRACTION PHACO AND INTRAOCULAR LENS PLACEMENT (IOC);  Surgeon: Birder Robson, MD;  Location: [facility];  Service: Ophthalmology;  Laterality: Left;  [place] 00:42.0 AP% 15.0 CDE 6.31 Fluid Pack lot # [id number]  . CHOLECYSTECTOMY  05/15/2011   Procedure: LAPAROSCOPIC CHOLECYSTECTOMY;  Surgeon: Rolm Bookbinder, MD;  Location: [facility];  Service: General;  Laterality: N/A;  . COLONOSCOPY W/ POLYPECTOMY    . CORONARY ARTERY BYPASS GRAFT  2007   x 4  . EYE SURGERY    . FOOT SURGERY  2012   right foot  . JOINT REPLACEMENT  8/10   Right THR--[name]  . LEFT AND RIGHT HEART CATHETERIZATION WITH CORONARY/GRAFT ANGIOGRAM N/A 06/05/2013   Procedure: LEFT AND RIGHT HEART CATHETERIZATION WITH  Beatrix Fetters;  Surgeon: Peter M Martinique, MD;  Location: [facility] C[facility];  Service: Cardiovascular;  Laterality: N/A;  . LUMBAR LAMINECTOMY  1989  . Prostate photovaporization  5/16   Dr Budd Palmer  . RIGHT/LEFT HEART CATH AND CORONARY/GRAFT ANGIOGRAPHY N/A 01/05/2018   Procedure: RIGHT/LEFT HEART CATH AND CORONARY/GRAFT ANGIOGRAPHY;  Surgeon: Minna Merritts, MD;  Location: [facility];  Service: Cardiovascular;  Laterality: N/A;    Family History  Problem Relation Age of Onset  . Lung cancer Mother   . Heart  disease Father   . Colon cancer Neg Hx   . Breast cancer Neg Hx     SOCIAL HX: married    Current Outpatient Medications:  .  amLODipine (NORVASC) 5 MG tablet, TAKE ONE TABLET TWICE DAILY (Patient taking differently: Take 5 mg by mouth 2 (two) times a day. ), Disp: 90 tablet, Rfl: 0 .  aspirin EC 81 MG tablet, Take 81 mg by mouth daily., Disp: , Rfl:  .  BD INSULIN SYRINGE ULTRAFINE 31G X 15/64" 0.3 ML MISC, USE AS DIRECTED, Disp: 100 each, Rfl: 3 .  blood glucose meter kit and supplies KIT, OneTouch Verio w/ Device Kit OneTouch Verio VI STRP OneTouch Delica Lancet Dev misc OneTouch Delica Lancets 82L or Fine  Please check CBGs fasting and 2 other times during the day  Dx Code E11.8, Disp: 1 each, Rfl: 0 .  carvedilol (COREG) 12.5 MG tablet, TAKE 1 TABLET BY MOUTH TWICE DAILY (Patient taking differently: Take 12.5 mg by mouth 2 (two) times a day. ), Disp: 180 tablet, Rfl: 4 .  finasteride (PROSCAR) 5 MG tablet, Take 1 tablet (5 mg total) by mouth daily. (Patient taking differently: Take 5 mg by mouth at bedtime. ), Disp: 90 tablet, Rfl: 1 .  Insulin Pen Needle (BD PEN NEEDLE NANO U/F) 32G X 4 MM MISC, USE AS DIRECTED, Disp: 100 each, Rfl: 11 .  omeprazole (PRILOSEC) 20 MG capsule, TAKE 1 CAPSULE BY MOUTH TWICE DAILY (Patient taking differently: Take 20 mg by mouth 2 (two) times a day. ), Disp: 60 capsule, Rfl: 2 .  oxyCODONE-acetaminophen (PERCOCET) 5-325 MG tablet, Take 1 tablet by mouth every 8 (eight) hours as needed for severe pain., Disp: 15 tablet, Rfl: 0 .  potassium chloride (K-DUR) 10 MEQ tablet, Take 1 tablet (10 meq) by mouth twice daily 6 days a week (Patient taking differently: Take 10 mEq by mouth 2 (two) times daily. ), Disp: , Rfl:  .  rosuvastatin (CRESTOR) 20 MG tablet, Take 1 tablet (20 mg total) by mouth daily. (Patient taking differently: Take 20 mg by mouth at bedtime. ), Disp: 90 tablet, Rfl: 4 .  TOUJEO SOLOSTAR 300 UNIT/ML SOPN, INJECT 50-60 UNITS INTO THE SKIN DAILY  .INCREASE BY 1 UNITS/DAY UNTILFBS ARE BETWEEN 80-130. DO NOT EXCEED 60 UNITS PER DAY. (Patient taking differently: Inject 46-48 Units into the skin daily. ), Disp: 4.5 mL, Rfl: 6 .  Vitamin D, Cholecalciferol, 1000 units TABS, Take 1,000 Units by mouth daily. , Disp: , Rfl:  .  acetaminophen (TYLENOL) 500 MG tablet, Take 1,000 mg by mouth 2 (two) times daily., Disp: , Rfl:  .  gabapentin (NEURONTIN) 100 MG capsule, Take 1 capsule (100 mg total) by mouth 3 (three) times daily., Disp: 30 capsule, Rfl: 0 .  zolpidem (AMBIEN) 5 MG tablet, Take 1 tablet (5 mg total) by mouth at bedtime as needed for sleep. Stop trazadone not helpful, Disp: 30 tablet, Rfl: 0  EXAM:  VITALS per patient if applicable:  GENERAL: alert, oriented, appears well and in no acute distress  HEENT: atraumatic, conjunttiva clear, no obvious abnormalities on inspection of external nose and ears  NECK: normal movements of the head and neck  LUNGS: on inspection no signs of respiratory distress, breathing rate appears normal, no obvious gross SOB, gasping or wheezing  CV: no obvious cyanosis  MS: moves all visible extremities without noticeable abnormality  PSYCH/NEURO: pleasant and cooperative, no obvious depression or anxiety, speech and thought processing grossly intact  ASSESSMENT AND PLAN:  Discussed the following assessment and plan:  Lumbar radiculopathy s/p L4 laminectomy in 1989/chronic back pain and worsening - Plan: MR Lumbar Spine Wo Contrast, DG HIP UNILAT WITH PELVIS 2-3 VIEWS RIGHT, oxyCODONE-acetaminophen (PERCOCET) 5-325 MG tablet tid prn, gabapentin (NEURONTIN) 100 MG capsule tid  -consider Neurosurg consult in future if abnormal MRI and PT  Previously saw NS in [place]  Right leg weakness - Plan: MR Lumbar Spine Wo Contrast, DG HIP UNILAT WITH PELVIS 2-3 VIEWS RIGHT  Pain of right hip joint - Plan: MR Lumbar Spine Wo Contrast, DG HIP UNILAT WITH PELVIS 2-3 VIEWS RIGHT, oxyCODONE-acetaminophen (PERCOCET)  5-325 MG tablet -reviewed T spine MRI 07/10/15 multilevel deg changes and enlarged prostate size and right SI jt issues   Insomnia, unspecified type - Plan: zolpidem (AMBIEN) 5 MG tablet, d/c trazadone 50-100 mg qhs   Herpes zoster without complication - Plan: gabapentin (NEURONTIN) 100 MG capsule, improving continue gabapentin 100 mg tid until resolved   Skin blister lle ? Drug reaction vs other it has popped on its own and draining  -if not healing f/u PCP and consider dermatology in future for consult   Enlarged prostate -will ask PCP if he f/u with urology noted CT ab/pelvis 7.2 x 6.0 cm    I discussed the assessment and treatment plan with the patient. The patient was provided an opportunity to ask questions and all were answered. The patient agreed with the plan and demonstrated an understanding of the instructions.   The patient was advised to call back or seek an in-person evaluation if the symptoms worsen or if the condition fails to improve as anticipated.  Time spent 25 minutes  Delorise Jackson, MD

## 2018-10-12 ENCOUNTER — Other Ambulatory Visit: Payer: Self-pay

## 2018-10-12 ENCOUNTER — Other Ambulatory Visit: Payer: Self-pay | Admitting: Internal Medicine

## 2018-10-12 ENCOUNTER — Ambulatory Visit
Admission: RE | Admit: 2018-10-12 | Discharge: 2018-10-12 | Disposition: A | Discharge status: Home / Self Care | Payer: Medicare Other | Source: Ambulatory Visit | Attending: Internal Medicine | Admitting: Internal Medicine

## 2018-10-12 DIAGNOSIS — R269 Unspecified abnormalities of gait and mobility: Secondary | ICD-10-CM | POA: Insufficient documentation

## 2018-10-12 DIAGNOSIS — M5416 Radiculopathy, lumbar region: Secondary | ICD-10-CM | POA: Insufficient documentation

## 2018-10-12 DIAGNOSIS — R29898 Other symptoms and signs involving the musculoskeletal system: Secondary | ICD-10-CM

## 2018-10-12 DIAGNOSIS — M25551 Pain in right hip: Secondary | ICD-10-CM | POA: Insufficient documentation

## 2018-10-12 DIAGNOSIS — Z9889 Other specified postprocedural states: Secondary | ICD-10-CM | POA: Insufficient documentation

## 2018-10-12 DIAGNOSIS — M545 Low back pain, unspecified: Secondary | ICD-10-CM

## 2018-10-12 DIAGNOSIS — G8929 Other chronic pain: Secondary | ICD-10-CM

## 2018-10-12 DIAGNOSIS — R937 Abnormal findings on diagnostic imaging of other parts of musculoskeletal system: Secondary | ICD-10-CM

## 2018-10-13 DIAGNOSIS — M5136 Other intervertebral disc degeneration, lumbar region: Secondary | ICD-10-CM | POA: Diagnosis not present

## 2018-10-13 DIAGNOSIS — M5417 Radiculopathy, lumbosacral region: Secondary | ICD-10-CM | POA: Diagnosis not present

## 2018-10-15 ENCOUNTER — Telehealth: Payer: Self-pay | Admitting: Family Medicine

## 2018-10-15 ENCOUNTER — Ambulatory Visit (INDEPENDENT_AMBULATORY_CARE_PROVIDER_SITE_OTHER): Payer: Medicare Other | Admitting: Family Medicine

## 2018-10-15 ENCOUNTER — Other Ambulatory Visit: Payer: Self-pay

## 2018-10-15 ENCOUNTER — Encounter: Payer: Self-pay | Admitting: Family Medicine

## 2018-10-15 DIAGNOSIS — M545 Low back pain, unspecified: Secondary | ICD-10-CM

## 2018-10-15 DIAGNOSIS — G8929 Other chronic pain: Secondary | ICD-10-CM

## 2018-10-15 DIAGNOSIS — R42 Dizziness and giddiness: Secondary | ICD-10-CM | POA: Insufficient documentation

## 2018-10-15 MED ORDER — ONDANSETRON HCL 4 MG PO TABS: 4.0000 mg | ORAL_TABLET | Freq: Three times a day (TID) | ORAL | 0 refills | Status: DC | PRN

## 2018-10-15 NOTE — Assessment & Plan Note (Signed)
Symptoms are consistent with peripheral vertigo.  Potentially BPPV related given reported symptoms.  He will complete modified Epley maneuver at home.  Given the [place] website name for home Epley maneuver instructions.  Patient has not responded to meclizine.  Benzodiazepines are not an option.  We will trial Zofran to see if that will help based on review of up-to-date.  If not improving over the next several days he will let us know.  If he develops persistent vertigo or any other neurological symptoms he will be evaluated in the ED.

## 2018-10-15 NOTE — Telephone Encounter (Signed)
Pt returned call.  Pt said that he went to urgent care walk-in clinic on Saturday and was prescribed cyclobezaprine for hip pain. Pt said he is having vertigo symptoms and cannot walk without assistance.  Pt believes med prescribed on Saturday is causing symptoms.  Pt scheduled appt today w/ Dr. [name] at 3:15 pm.

## 2018-10-15 NOTE — Telephone Encounter (Signed)
Seen for a virtual visit.

## 2018-10-15 NOTE — Telephone Encounter (Signed)
Called pt.  No answer.  LMTCB for pt to call to schedule an appt (ok per DPR).

## 2018-10-15 NOTE — Assessment & Plan Note (Signed)
I encouraged the patient to restart the prednisone as that will likely be beneficial for his low back pain.  He will discontinue the Flexeril.  He mentioned Valium as an option that his pharmacist though I noted that this would not be a good option given that he has been taking narcotics for his back pain.  Discussed that there is significant risk of drowsiness with this.  He will see the neurosurgeon next week as planned.

## 2018-10-15 NOTE — Telephone Encounter (Signed)
Please let the patient know that he will have to request his MRI CD from medical records at the hospital. He could also call the neurosurgeons office and see if they need a copy of the MRI before he goes through the trouble of going to the hospital to get a copy.

## 2018-10-15 NOTE — Telephone Encounter (Signed)
Copied from [place] [phone]. Topic: General - Other >> Oct 15, 2018 11:48 AM Parke Poisson wrote: Reason for CRM: Pt was seen at walk in clinic on Saturday and was prescribed  Cyclobenzaprine for hip pain. He is now having vertigo and feels the medication may have triggered it. He states the pharmacist suggested that he get a prescription for valium. He is asking that this be sent to [place], [place] - [place] [phone] (Phone) [phone] (Fax)

## 2018-10-15 NOTE — Telephone Encounter (Signed)
Wrong office

## 2018-10-15 NOTE — Progress Notes (Signed)
Virtual Visit via telephone Note  This visit type was conducted due to national recommendations for restrictions regarding the COVID-19 pandemic (e.g. social distancing).  This format is felt to be most appropriate for this patient at this time.  All issues noted in this document were discussed and addressed.  No physical exam was performed (except for noted visual exam findings with Video Visits).   I connected with [name] [name] today at  3:15 PM EDT by telephone and verified that I am speaking with the correct person using two identifiers. Location patient: home Location provider: work  Persons participating in the virtual visit: patient, provider, wife  I discussed the limitations, risks, security and privacy concerns of performing an evaluation and management service by telephone and the availability of in person appointments. I also discussed with the patient that there may be a patient responsible charge related to this service. The patient expressed understanding and agreed to proceed.  Interactive audio and video telecommunications were attempted between this provider and patient, however failed, due to patient having technical difficulties OR patient did not have access to video capability.  We continued and completed visit with audio only.  Reason for visit: same day   HPI: Vertigo: Patient notes this started several days ago.  He was seen the weekend for back pain and was prescribed Flexeril.  He notes he was fine Saturday evening and then stood up and the room started to spin around him.  He lost his balance and did fall and bruised his arm though no other injuries.  He has been taking meclizine with little benefit.  He notes no vertiginous symptoms if he is sitting down though as soon as he moves his head the room will spin.  He is okay when he is laying still although if he rolls over he will have spinning.  No tinnitus or hearing issues.  No vision changes, numbness, or weakness.   He discontinued the prednisone and the Flexeril that was prescribed at the walk-in clinic for his back pain as he thought this may be contributing to his vertigo.  Low back pain: Patient has an appointment with neurosurgery next week.  He had an MRI last week that did reveal degenerative changes.  He notes his back pain is stable and he has been taking oxycodone for this.  No incontinence or saddle anesthesia.   ROS: See pertinent positives and negatives per HPI.  Past Medical History:  Diagnosis Date   BPH (benign prostatic hyperplasia)    Cancer ([place])    skin   Cataract    Coronary artery disease    a. 4v CABG 01/2005; b. cath 06/05/13 showed patent grafts: LIMA-LAD, VG-D, VG-OM3, VG-dRCA, nl filling pressures, nl LV fxn   Diastolic dysfunction    a. echo 11/4172: nl systolic fxn, mild LVH, diastolic relaxation abnormality, mildly enlarged LA, mild Ao insufficiency   Dyspnea    with exertion   ED (erectile dysfunction)    GERD (gastroesophageal reflux disease)    Hyperlipidemia    Hypertension    IDDM (insulin dependent diabetes mellitus) ([place]) 2000   Osteoarthritis    Perirectal fistula    Pneumonia 12/2016   Tubular adenoma of colon 07/2012   Vertigo     Past Surgical History:  Procedure Laterality Date   BACK SURGERY     CARDIAC CATHETERIZATION  06/05/2013   [facility]    CARDIAC CATHETERIZATION N/A 09/24/2015   Procedure: Right Heart Cath and Coronary/Graft Angiography;  Surgeon:  Minna Merritts, MD;  Location: [facility];  Service: Cardiovascular;  Laterality: N/A;   CATARACT EXTRACTION  sept 2013   right   CATARACT EXTRACTION W/PHACO Left 03/28/2017   Procedure: CATARACT EXTRACTION PHACO AND INTRAOCULAR LENS PLACEMENT (IOC);  Surgeon: Birder Robson, MD;  Location: [facility];  Service: Ophthalmology;  Laterality: Left;  [place] 00:42.0 AP% 15.0 CDE 6.31 Fluid Pack lot # [id number]   CHOLECYSTECTOMY  05/15/2011   Procedure: LAPAROSCOPIC  CHOLECYSTECTOMY;  Surgeon: Rolm Bookbinder, MD;  Location: [facility];  Service: General;  Laterality: N/A;   COLONOSCOPY W/ POLYPECTOMY     CORONARY ARTERY BYPASS GRAFT  2007   x 4   EYE SURGERY     FOOT SURGERY  2012   right foot   JOINT REPLACEMENT  8/10   Right THR--[name]   LEFT AND RIGHT HEART CATHETERIZATION WITH CORONARY/GRAFT ANGIOGRAM N/A 06/05/2013   Procedure: LEFT AND RIGHT HEART CATHETERIZATION WITH Beatrix Fetters;  Surgeon: Peter M Martinique, MD;  Location: [facility];  Service: Cardiovascular;  Laterality: N/A;   LUMBAR LAMINECTOMY  1989   Prostate photovaporization  5/16   Dr Budd Palmer   RIGHT/LEFT HEART CATH AND CORONARY/GRAFT ANGIOGRAPHY N/A 01/05/2018   Procedure: RIGHT/LEFT HEART CATH AND CORONARY/GRAFT ANGIOGRAPHY;  Surgeon: Minna Merritts, MD;  Location: [facility];  Service: Cardiovascular;  Laterality: N/A;    Family History  Problem Relation Age of Onset   Lung cancer Mother    Heart disease Father    Colon cancer Neg Hx    Breast cancer Neg Hx     SOCIAL HX: Non-smoker.   Current Outpatient Medications:    acetaminophen (TYLENOL) 500 MG tablet, Take 1,000 mg by mouth 2 (two) times daily., Disp: , Rfl:    amLODipine (NORVASC) 5 MG tablet, TAKE ONE TABLET TWICE DAILY (Patient taking differently: Take 5 mg by mouth 2 (two) times a day. ), Disp: 90 tablet, Rfl: 0   aspirin EC 81 MG tablet, Take 81 mg by mouth daily., Disp: , Rfl:    BD INSULIN SYRINGE ULTRAFINE 31G X 15/64" 0.3 ML MISC, USE AS DIRECTED, Disp: 100 each, Rfl: 3   blood glucose meter kit and supplies KIT, OneTouch Verio w/ Device Kit OneTouch Verio VI STRP OneTouch Delica Lancet Dev misc OneTouch Delica Lancets 01V or Fine  Please check CBGs fasting and 2 other times during the day  Dx Code E11.8, Disp: 1 each, Rfl: 0   carvedilol (COREG) 12.5 MG tablet, TAKE 1 TABLET BY MOUTH TWICE DAILY (Patient taking differently: Take 12.5 mg by mouth 2 (two) times a  day. ), Disp: 180 tablet, Rfl: 4   finasteride (PROSCAR) 5 MG tablet, Take 1 tablet (5 mg total) by mouth daily. (Patient taking differently: Take 5 mg by mouth at bedtime. ), Disp: 90 tablet, Rfl: 1   gabapentin (NEURONTIN) 100 MG capsule, Take 1 capsule (100 mg total) by mouth 3 (three) times daily., Disp: 30 capsule, Rfl: 0   Insulin Pen Needle (BD PEN NEEDLE NANO U/F) 32G X 4 MM MISC, USE AS DIRECTED, Disp: 100 each, Rfl: 11   omeprazole (PRILOSEC) 20 MG capsule, TAKE 1 CAPSULE BY MOUTH TWICE DAILY (Patient taking differently: Take 20 mg by mouth 2 (two) times a day. ), Disp: 60 capsule, Rfl: 2   oxyCODONE-acetaminophen (PERCOCET) 5-325 MG tablet, Take 1 tablet by mouth every 8 (eight) hours as needed for severe pain., Disp: 15 tablet, Rfl: 0   potassium chloride (K-DUR) 10  MEQ tablet, Take 1 tablet (10 meq) by mouth twice daily 6 days a week (Patient taking differently: Take 10 mEq by mouth 2 (two) times daily. ), Disp: , Rfl:    rosuvastatin (CRESTOR) 20 MG tablet, Take 1 tablet (20 mg total) by mouth daily. (Patient taking differently: Take 20 mg by mouth at bedtime. ), Disp: 90 tablet, Rfl: 4   TOUJEO SOLOSTAR 300 UNIT/ML SOPN, INJECT 50-60 UNITS INTO THE SKIN DAILY .INCREASE BY 1 UNITS/DAY UNTILFBS ARE BETWEEN 80-130. DO NOT EXCEED 60 UNITS PER DAY. (Patient taking differently: Inject 46-48 Units into the skin daily. ), Disp: 4.5 mL, Rfl: 6   Vitamin D, Cholecalciferol, 1000 units TABS, Take 1,000 Units by mouth daily. , Disp: , Rfl:    zolpidem (AMBIEN) 5 MG tablet, Take 1 tablet (5 mg total) by mouth at bedtime as needed for sleep. Stop trazadone not helpful, Disp: 30 tablet, Rfl: 0   ondansetron (ZOFRAN) 4 MG tablet, Take 1 tablet (4 mg total) by mouth every 8 (eight) hours as needed for nausea or vomiting., Disp: 10 tablet, Rfl: 0  EXAM: This was a telehealth telephone visit and thus no physical exam was completed.  ASSESSMENT AND PLAN:  Discussed the following assessment  and plan:  Chronic low back pain I encouraged the patient to restart the prednisone as that will likely be beneficial for his low back pain.  He will discontinue the Flexeril.  He mentioned Valium as an option that his pharmacist though I noted that this would not be a good option given that he has been taking narcotics for his back pain.  Discussed that there is significant risk of drowsiness with this.  He will see the neurosurgeon next week as planned.  Vertigo Symptoms are consistent with peripheral vertigo.  Potentially BPPV related given reported symptoms.  He will complete modified Epley maneuver at home.  Given the [place] website name for home Epley maneuver instructions.  Patient has not responded to meclizine.  Benzodiazepines are not an option.  We will trial Zofran to see if that will help based on review of up-to-date.  If not improving over the next several days he will let us know.  If he develops persistent vertigo or any other neurological symptoms he will be evaluated in the ED.    I discussed the assessment and treatment plan with the patient. The patient was provided an opportunity to ask questions and all were answered. The patient agreed with the plan and demonstrated an understanding of the instructions.   The patient was advised to call back or seek an in-person evaluation if the symptoms worsen or if the condition fails to improve as anticipated.  I provided 19 minutes of non-face-to-face time during this encounter.   [name], MD

## 2018-10-15 NOTE — Telephone Encounter (Signed)
Pt states he was recently dx with ruptured disc L1 and would like a referral to:  Dr Kary Kos [address]  Ph:  [phone] Fx:  [phone]

## 2018-10-16 NOTE — Telephone Encounter (Signed)
I called and spoke with the patient and informed him to call the neurosurgeon before he requests the MRI CD from medical records to make sure they need it, patient understood.  Etter Royall,cma

## 2018-10-17 ENCOUNTER — Emergency Department (HOSPITAL_COMMUNITY)
Admission: EM | Admit: 2018-10-17 | Discharge: 2018-10-17 | Discharge status: Against Medical Advice | Payer: Medicare Other | Attending: Emergency Medicine | Admitting: Emergency Medicine

## 2018-10-17 ENCOUNTER — Encounter (HOSPITAL_COMMUNITY): Payer: Self-pay

## 2018-10-17 ENCOUNTER — Other Ambulatory Visit: Payer: Self-pay

## 2018-10-17 DIAGNOSIS — Z5321 Procedure and treatment not carried out due to patient leaving prior to being seen by health care provider: Secondary | ICD-10-CM | POA: Insufficient documentation

## 2018-10-17 DIAGNOSIS — M549 Dorsalgia, unspecified: Secondary | ICD-10-CM | POA: Diagnosis present

## 2018-10-17 NOTE — ED Triage Notes (Signed)
Pt arrives POV for eval of back pain. Known ruptured disc, appt w/ neurosurg tmrw AM at 1030, but states pain is too severe.

## 2018-10-17 NOTE — ED Notes (Signed)
Pt. Not answering for vitals reassess x2.

## 2018-10-17 NOTE — ED Notes (Signed)
Pt. Not answering for vitals reassess x3. Pulling OTF.

## 2018-10-18 DIAGNOSIS — M48062 Spinal stenosis, lumbar region with neurogenic claudication: Secondary | ICD-10-CM | POA: Diagnosis not present

## 2018-10-19 ENCOUNTER — Telehealth: Payer: Self-pay | Admitting: *Deleted

## 2018-10-19 NOTE — Telephone Encounter (Signed)
Patient has very bad back pain and would like a call back from the doctor or nurse to advise what would be the next step for him.  He has been taking medication and it is not getting any better.

## 2018-10-19 NOTE — Telephone Encounter (Signed)
Noted  

## 2018-10-19 NOTE — Telephone Encounter (Signed)
Copied from [place] [phone]. Topic: General - Other >> Oct 19, 2018  8:29 AM Keene Breath wrote: Reason for CRM: Patient has very bad back pain and would like a call back from the doctor or nurse to advise what would be the next step for him.  He has been taking medication and it is not getting any better.  CB# [phone]

## 2018-10-19 NOTE — Telephone Encounter (Signed)
He needs to be seen in person if his pain is severe to determine if he needs anything done acutely for his back prior to seeing neurosurgery next week. I would advise him going to urgent care to be evaluated today. Thanks.

## 2018-10-19 NOTE — Telephone Encounter (Signed)
I called and spoke with the patient and he stated the pain is a little better right now but he only slept 2 hours last night, he also stated he saw a neurosurgeon yesterday and the plan is he is sending him to a pain place where they will inject pain medicine into his back to see if it helps, if it does not help the next step is surgery. He is waiting for them to call and tell him when he can have the injection.  Karinna Beadles,cma

## 2018-10-20 DIAGNOSIS — R001 Bradycardia, unspecified: Secondary | ICD-10-CM | POA: Diagnosis not present

## 2018-10-20 DIAGNOSIS — Z951 Presence of aortocoronary bypass graft: Secondary | ICD-10-CM | POA: Diagnosis not present

## 2018-10-20 DIAGNOSIS — I2510 Atherosclerotic heart disease of native coronary artery without angina pectoris: Secondary | ICD-10-CM | POA: Diagnosis not present

## 2018-10-20 DIAGNOSIS — E7800 Pure hypercholesterolemia, unspecified: Secondary | ICD-10-CM | POA: Diagnosis not present

## 2018-10-20 DIAGNOSIS — I10 Essential (primary) hypertension: Secondary | ICD-10-CM | POA: Diagnosis not present

## 2018-10-20 DIAGNOSIS — Z7982 Long term (current) use of aspirin: Secondary | ICD-10-CM | POA: Diagnosis not present

## 2018-10-20 DIAGNOSIS — M5416 Radiculopathy, lumbar region: Secondary | ICD-10-CM | POA: Diagnosis not present

## 2018-10-20 DIAGNOSIS — E119 Type 2 diabetes mellitus without complications: Secondary | ICD-10-CM | POA: Diagnosis not present

## 2018-10-20 DIAGNOSIS — M5126 Other intervertebral disc displacement, lumbar region: Secondary | ICD-10-CM | POA: Diagnosis not present

## 2018-10-20 DIAGNOSIS — Z981 Arthrodesis status: Secondary | ICD-10-CM | POA: Diagnosis not present

## 2018-10-23 ENCOUNTER — Other Ambulatory Visit: Payer: Self-pay | Admitting: Family Medicine

## 2018-10-24 MED ORDER — NALOXONE HCL 4 MG/0.1ML NA LIQD: 1.0000 | Freq: Once | NASAL | 0 refills | Status: AC

## 2018-10-24 NOTE — Telephone Encounter (Signed)
Spoke with the patient. He went to the [facility] ED on 10/20/18 for evaluation. They advised him to double his dose of pain medication if needed and that is why he is out of medication currently.  I reviewed the controlled substance database.  I reviewed the note from the ED.  They are having him see a pain specialist.  I have refilled the hydrocodone.  I discussed the risk of respiratory depression with narcotics and we will send in Narcan as well for him to use if needed for respiratory depression.  He notes he is not abusing the medication and is not getting drowsy with it.  I discussed that even with appropriate use of the narcotics there is risk of respiratory depression and he should monitor for this.

## 2018-10-24 NOTE — Telephone Encounter (Signed)
Pt states the earliest appt at pain center [facility] is 11/07/18. Pt went to ED last sat and they prescribed hydrocodone Pt states he has enough hydrocodone to last through today.  He was prescribed oxycodone on 8/05 by Dr Linus Orn. Pt states it does not matter whih one he gets, just as long as he gets a refill today for one of these.  Pt states he is in much pain.  [place], [place] - [place] [phone] (Phone) [phone] (Fax)

## 2018-10-30 ENCOUNTER — Telehealth: Payer: Self-pay

## 2018-10-30 NOTE — Progress Notes (Signed)
Cardiac Individual Treatment Plan  Patient Details  Name: [name] [name] MRN: [id number] Date of Birth: 1933-05-16 Referring Provider:     Cardiac Rehab from 03/19/2018 in [facility] Cardiac and Pulmonary Rehab  Referring Provider  Ida Rogue MD      Initial Encounter Date:    Cardiac Rehab from 03/19/2018 in [facility] Cardiac and Pulmonary Rehab  Date  03/19/18      Visit Diagnosis: No diagnosis found.  Patient's Home Medications on Admission:  Current Outpatient Medications:  .  acetaminophen (TYLENOL) 500 MG tablet, Take 1,000 mg by mouth 2 (two) times daily., Disp: , Rfl:  .  amLODipine (NORVASC) 5 MG tablet, TAKE ONE TABLET TWICE DAILY (Patient taking differently: Take 5 mg by mouth 2 (two) times a day. ), Disp: 90 tablet, Rfl: 0 .  aspirin EC 81 MG tablet, Take 81 mg by mouth daily., Disp: , Rfl:  .  BD INSULIN SYRINGE ULTRAFINE 31G X 15/64" 0.3 ML MISC, USE AS DIRECTED, Disp: 100 each, Rfl: 3 .  blood glucose meter kit and supplies KIT, OneTouch Verio w/ Device Kit OneTouch Verio VI STRP OneTouch Delica Lancet Dev misc OneTouch Delica Lancets 76P or Fine  Please check CBGs fasting and 2 other times during the day  Dx Code E11.8, Disp: 1 each, Rfl: 0 .  carvedilol (COREG) 12.5 MG tablet, TAKE 1 TABLET BY MOUTH TWICE DAILY (Patient taking differently: Take 12.5 mg by mouth 2 (two) times a day. ), Disp: 180 tablet, Rfl: 4 .  finasteride (PROSCAR) 5 MG tablet, Take 1 tablet (5 mg total) by mouth daily. (Patient taking differently: Take 5 mg by mouth at bedtime. ), Disp: 90 tablet, Rfl: 1 .  gabapentin (NEURONTIN) 100 MG capsule, Take 1 capsule (100 mg total) by mouth 3 (three) times daily., Disp: 30 capsule, Rfl: 0 .  Insulin Pen Needle (BD PEN NEEDLE NANO U/F) 32G X 4 MM MISC, USE AS DIRECTED, Disp: 100 each, Rfl: 11 .  omeprazole (PRILOSEC) 20 MG capsule, TAKE 1 CAPSULE BY MOUTH TWICE DAILY (Patient taking differently: Take 20 mg by mouth 2 (two) times a day. ), Disp: 60 capsule, Rfl:  2 .  ondansetron (ZOFRAN) 4 MG tablet, Take 1 tablet (4 mg total) by mouth every 8 (eight) hours as needed for nausea or vomiting., Disp: 10 tablet, Rfl: 0 .  oxyCODONE-acetaminophen (PERCOCET) 5-325 MG tablet, Take 1 tablet by mouth every 8 (eight) hours as needed for severe pain., Disp: 15 tablet, Rfl: 0 .  potassium chloride (K-DUR) 10 MEQ tablet, Take 1 tablet (10 meq) by mouth twice daily 6 days a week (Patient taking differently: Take 10 mEq by mouth 2 (two) times daily. ), Disp: , Rfl:  .  rosuvastatin (CRESTOR) 20 MG tablet, Take 1 tablet (20 mg total) by mouth daily. (Patient taking differently: Take 20 mg by mouth at bedtime. ), Disp: 90 tablet, Rfl: 4 .  TOUJEO SOLOSTAR 300 UNIT/ML SOPN, INJECT 50-60 UNITS INTO THE SKIN DAILY .INCREASE BY 1 UNITS/DAY UNTILFBS ARE BETWEEN 80-130. DO NOT EXCEED 60 UNITS PER DAY. (Patient taking differently: Inject 46-48 Units into the skin daily. ), Disp: 4.5 mL, Rfl: 6 .  Vitamin D, Cholecalciferol, 1000 units TABS, Take 1,000 Units by mouth daily. , Disp: , Rfl:  .  zolpidem (AMBIEN) 5 MG tablet, Take 1 tablet (5 mg total) by mouth at bedtime as needed for sleep. Stop trazadone not helpful, Disp: 30 tablet, Rfl: 0  Past Medical History: Past Medical History:  Diagnosis  Date  . BPH (benign prostatic hyperplasia)   . Cancer ([place])    skin  . Cataract   . Coronary artery disease    a. 4v CABG 01/2005; b. cath 06/05/13 showed patent grafts: LIMA-LAD, VG-D, VG-OM3, VG-dRCA, nl filling pressures, nl LV fxn  . Diastolic dysfunction    a. echo 11/3732: nl systolic fxn, mild LVH, diastolic relaxation abnormality, mildly enlarged LA, mild Ao insufficiency  . Dyspnea    with exertion  . ED (erectile dysfunction)   . GERD (gastroesophageal reflux disease)   . Hyperlipidemia   . Hypertension   . IDDM (insulin dependent diabetes mellitus) ([place]) 2000  . Osteoarthritis   . Perirectal fistula   . Pneumonia 12/2016  . Tubular adenoma of colon 07/2012  . Vertigo      Tobacco Use: Social History   Tobacco Use  Smoking Status Never Smoker  Smokeless Tobacco Never Used    Labs: Recent Review Flowsheet Data    Labs for ITP Cardiac and Pulmonary Rehab Latest Ref Rng & Units 08/02/2016 11/09/2016 08/18/2017 12/27/2017 09/07/2018   Cholestrol 100 - 199 mg/dL - - 99 125 -   LDLCALC 0 - 99 mg/dL - - 41 - -   LDLDIRECT mg/dL - - - - -   HDL >39.00 mg/dL - - 31.00(L) - -   Trlycerides 0 - 149 mg/dL - - 137.0 289(H) -   Hemoglobin A1c 4.8 - 5.6 % 8.4 8.3 7.7(H) 6.9(H) 7.2(H)   PHART 7.350 - 7.450 - - - - -   PCO2ART 35.0 - 45.0 mmHg - - - - -   HCO3 20.0 - 24.0 mEq/L - - - - -   TCO2 0 - 100 mmol/L - - - - -   O2SAT % - - - - -       Exercise Target Goals: Exercise Program Goal: Individual exercise prescription set using results from initial 6 min walk test and THRR while considering  patient's activity barriers and safety.   Exercise Prescription Goal: Initial exercise prescription builds to 30-45 minutes a day of aerobic activity, 2-3 days per week.  Home exercise guidelines will be given to patient during program as part of exercise prescription that the participant will acknowledge.  Activity Barriers & Risk Stratification:   6 Minute Walk:   Oxygen Initial Assessment:   Oxygen Re-Evaluation:   Oxygen Discharge (Final Oxygen Re-Evaluation):   Initial Exercise Prescription:   Perform Capillary Blood Glucose checks as needed.  Exercise Prescription Changes: Exercise Prescription Changes    Row Name 05/10/18 0900             Response to Exercise   Blood Pressure (Admit)  110/58       Blood Pressure (Exercise)  152/68       Blood Pressure (Exit)  132/60       Heart Rate (Admit)  63 bpm       Heart Rate (Exercise)  69 bpm       Heart Rate (Exit)  58 bpm       Rating of Perceived Exertion (Exercise)  11       Duration  Progress to 45 minutes of aerobic exercise without signs/symptoms of physical distress       Intensity   Other (comment) use RPE         Progression   Progression  Continue to progress workloads to maintain intensity without signs/symptoms of physical distress.       Average METs  4  Resistance Training   Training Prescription  Yes       Weight  3 lb       Reps  10-15         Interval Training   Interval Training  No         NuStep   Level  4       SPM  80       Minutes  15       METs  3.6         REL-XR   Level  1       Speed  50       Minutes  15       METs  4.4          Exercise Comments:   Exercise Goals and Review:   Exercise Goals Re-Evaluation : Exercise Goals Re-Evaluation    [place] Name 05/10/18 0914 05/22/18 1346           Exercise Goal Re-Evaluation   Exercise Goals Review  Increase Physical Activity;Increase Strength and Stamina;Able to understand and use rate of perceived exertion (RPE) scale;Able to understand and use Dyspnea scale;Knowledge and understanding of Target Heart Rate Range (THRR)  Increase Physical Activity;Increase Strength and Stamina;Able to understand and use rate of perceived exertion (RPE) scale;Knowledge and understanding of Target Heart Rate Range (THRR);Able to check pulse independently      Comments  [name] [name] was out quite a bit with sickness in February.  Staff will monitor progress as he gets back to a regular routine.  Our programs are currently closed due to COVID 19.  Staff are following up with patients via email and phone calls.      Expected Outcomes  Short - attend class consistently Long - improve overall MET level  -         Discharge Exercise Prescription (Final Exercise Prescription Changes): Exercise Prescription Changes - 05/10/18 0900      Response to Exercise   Blood Pressure (Admit)  110/58    Blood Pressure (Exercise)  152/68    Blood Pressure (Exit)  132/60    Heart Rate (Admit)  63 bpm    Heart Rate (Exercise)  69 bpm    Heart Rate (Exit)  58 bpm    Rating of Perceived Exertion (Exercise)  11    Duration   Progress to 45 minutes of aerobic exercise without signs/symptoms of physical distress    Intensity  Other (comment)   use RPE     Progression   Progression  Continue to progress workloads to maintain intensity without signs/symptoms of physical distress.    Average METs  4      Resistance Training   Training Prescription  Yes    Weight  3 lb    Reps  10-15      Interval Training   Interval Training  No      NuStep   Level  4    SPM  80    Minutes  15    METs  3.6      REL-XR   Level  1    Speed  50    Minutes  15    METs  4.4       Nutrition:  Target Goals: Understanding of nutrition guidelines, daily intake of sodium <1558m, cholesterol <2038m calories 30% from fat and 7% or less from saturated fats, daily to have 5 or more servings of fruits and vegetables.  Biometrics:  Nutrition Therapy Plan and Nutrition Goals:   Nutrition Assessments:   Nutrition Goals Re-Evaluation:   Nutrition Goals Discharge (Final Nutrition Goals Re-Evaluation):   Psychosocial: Target Goals: Acknowledge presence or absence of significant depression and/or stress, maximize coping skills, provide positive support system. Participant is able to verbalize types and ability to use techniques and skills needed for reducing stress and depression.   Initial Review & Psychosocial Screening:   Quality of Life Scores:   Scores of 19 and below usually indicate a poorer quality of life in these areas.  A difference of  2-3 points is a clinically meaningful difference.  A difference of 2-3 points in the total score of the Quality of Life Index has been associated with significant improvement in overall quality of life, self-image, physical symptoms, and general health in studies assessing change in quality of life.  PHQ-9: Recent Review Flowsheet Data    Depression screen [facility] 2/9 10/15/2018 10/10/2018 03/19/2018 03/05/2018 12/26/2017   Decreased Interest 0 0 0 0 0   Down, Depressed,  Hopeless 0 0 0 0 0   PHQ - 2 Score 0 0 0 0 0   Altered sleeping - - 1 - -   Tired, decreased energy - - 1 - -   Change in appetite - - 1 - -   Feeling bad or failure about yourself  - - 0 - -   Trouble concentrating - - 0 - -   Moving slowly or fidgety/restless - - 0 - -   Suicidal thoughts - - 0 - -   PHQ-9 Score - - 3 - -     Interpretation of Total Score  Total Score Depression Severity:  1-4 = Minimal depression, 5-9 = Mild depression, 10-14 = Moderate depression, 15-19 = Moderately severe depression, 20-27 = Severe depression   Psychosocial Evaluation and Intervention:   Psychosocial Re-Evaluation: Psychosocial Re-Evaluation    Row Name 05/25/18 1023             Psychosocial Re-Evaluation   Current issues with  Current Stress Concerns;Current Sleep Concerns       Comments  [name] [name] says he has no new stress and is sleeping about the same as before.  he is staying busy at home with current situation.       Expected Outcomes  Short - maintain positve mental outlook Long - continue to manage stress successfully          Psychosocial Discharge (Final Psychosocial Re-Evaluation): Psychosocial Re-Evaluation - 05/25/18 1023      Psychosocial Re-Evaluation   Current issues with  Current Stress Concerns;Current Sleep Concerns    Comments  [name] [name] says he has no new stress and is sleeping about the same as before.  he is staying busy at home with current situation.    Expected Outcomes  Short - maintain positve mental outlook Long - continue to manage stress successfully       Vocational Rehabilitation: Provide vocational rehab assistance to qualifying candidates.   Vocational Rehab Evaluation & Intervention:   Education: Education Goals: Education classes will be provided on a variety of topics geared toward better understanding of heart health and risk factor modification. Participant will state understanding/return demonstration of topics presented as noted by education test  scores.  Learning Barriers/Preferences:   Education Topics:  AED/CPR: - Group verbal and written instruction with the use of models to demonstrate the basic use of the AED with the basic ABC's of resuscitation.   Cardiac Rehab from 05/09/2018  in [facility] Cardiac and Pulmonary Rehab  Date  04/30/18  Educator  CE  Instruction Review Code  1- Verbalizes Understanding      General Nutrition Guidelines/Fats and Fiber: -Group instruction provided by verbal, written material, models and posters to present the general guidelines for heart healthy nutrition. Gives an explanation and review of dietary fats and fiber.   Controlling Sodium/Reading Food Labels: -Group verbal and written material supporting the discussion of sodium use in heart healthy nutrition. Review and explanation with models, verbal and written materials for utilization of the food label.   Exercise Physiology & General Exercise Guidelines: - Group verbal and written instruction with models to review the exercise physiology of the cardiovascular system and associated critical values. Provides general exercise guidelines with specific guidelines to those with heart or lung disease.    Cardiac Rehab from 05/09/2018 in [facility] Cardiac and Pulmonary Rehab  Date  03/26/18  Educator  [facility]  Instruction Review Code  1- Verbalizes Understanding      Aerobic Exercise & Resistance Training: - Gives group verbal and written instruction on the various components of exercise. Focuses on aerobic and resistive training programs and the benefits of this training and how to safely progress through these programs..   Cardiac Rehab from 05/09/2018 in [facility] Cardiac and Pulmonary Rehab  Date  03/28/18  Educator  AS  Instruction Review Code  1- Verbalizes Understanding      Flexibility, Balance, Mind/Body Relaxation: Provides group verbal/written instruction on the benefits of flexibility and balance training, including mind/body exercise modes such as  yoga, pilates and tai chi.  Demonstration and skill practice provided.   Cardiac Rehab from 05/09/2018 in [facility] Cardiac and Pulmonary Rehab  Date  04/02/18  Educator  AS  Instruction Review Code  1- Verbalizes Understanding      Stress and Anxiety: - Provides group verbal and written instruction about the health risks of elevated stress and causes of high stress.  Discuss the correlation between heart/lung disease and anxiety and treatment options. Review healthy ways to manage with stress and anxiety.   Cardiac Rehab from 05/09/2018 in [facility] Cardiac and Pulmonary Rehab  Date  04/25/18  Educator  [facility]  Instruction Review Code  1- Verbalizes Understanding      Depression: - Provides group verbal and written instruction on the correlation between heart/lung disease and depressed mood, treatment options, and the stigmas associated with seeking treatment.   Cardiac Rehab from 05/09/2018 in [facility] Cardiac and Pulmonary Rehab  Date  05/09/18  Educator  KG  Instruction Review Code  1- Verbalizes Understanding      Anatomy & Physiology of the Heart: - Group verbal and written instruction and models provide basic cardiac anatomy and physiology, with the coronary electrical and arterial systems. Review of Valvular disease and Heart Failure   Cardiac Rehab from 04/18/2016 in [facility] Cardiac and Pulmonary Rehab  Date  02/08/16  Educator  SB  Instruction Review Code (retired)  2- meets goals/outcomes      Cardiac Procedures: - Group verbal and written instruction to review commonly prescribed medications for heart disease. Reviews the medication, class of the drug, and side effects. Includes the steps to properly store meds and maintain the prescription regimen. (beta blockers and nitrates)   Cardiac Rehab from 04/18/2016 in [facility] Cardiac and Pulmonary Rehab  Date  04/18/16  Educator  CE  Instruction Review Code (retired)  2- meets goals/outcomes      Cardiac Medications I: - Group verbal and written  instruction to review commonly prescribed medications for heart disease. Reviews the medication, class of the drug, and side effects. Includes the steps to properly store meds and maintain the prescription regimen.   Cardiac Medications II: -Group verbal and written instruction to review commonly prescribed medications for heart disease. Reviews the medication, class of the drug, and side effects. (all other drug classes)   Cardiac Rehab from 05/09/2018 in [facility] Cardiac and Pulmonary Rehab  Date  04/04/18  Educator  [facility]  Instruction Review Code  1- Verbalizes Understanding       Go Sex-Intimacy & Heart Disease, Get SMART - Goal Setting: - Group verbal and written instruction through game format to discuss heart disease and the return to sexual intimacy. Provides group verbal and written material to discuss and apply goal setting through the application of the S.M.A.R.T. Method.   Cardiac Rehab from 04/18/2016 in [facility] Cardiac and Pulmonary Rehab  Date  04/18/16  Educator  CE  Instruction Review Code (retired)  2- meets goals/outcomes      Other Matters of the Heart: - Provides group verbal, written materials and models to describe Stable Angina and Peripheral Artery. Includes description of the disease process and treatment options available to the cardiac patient.   Cardiac Rehab from 04/18/2016 in [facility] Cardiac and Pulmonary Rehab  Date  04/11/16  Educator  C. Enterkin, RN  Instruction Review Code (retired)  2- meets goals/outcomes      Exercise & Equipment Safety: - Individual verbal instruction and demonstration of equipment use and safety with use of the equipment.   Cardiac Rehab from 05/09/2018 in [facility] Cardiac and Pulmonary Rehab  Date  03/19/18  Educator  SB  Instruction Review Code  1- Verbalizes Understanding      Infection Prevention: - Provides verbal and written material to individual with discussion of infection control including proper hand washing and proper equipment  cleaning during exercise session.   Cardiac Rehab from 05/09/2018 in [facility] Cardiac and Pulmonary Rehab  Date  03/19/18  Educator  SB  Instruction Review Code  1- Verbalizes Understanding      Falls Prevention: - Provides verbal and written material to individual with discussion of falls prevention and safety.   Cardiac Rehab from 05/09/2018 in [facility] Cardiac and Pulmonary Rehab  Date  03/19/18  Educator  SB  Instruction Review Code  1- Verbalizes Understanding      Diabetes: - Individual verbal and written instruction to review signs/symptoms of diabetes, desired ranges of glucose level fasting, after meals and with exercise. Acknowledge that pre and post exercise glucose checks will be done for 3 sessions at entry of program.   Cardiac Rehab from 05/09/2018 in [facility] Cardiac and Pulmonary Rehab  Date  03/19/18  Educator  SB  Instruction Review Code  1- Verbalizes Understanding      Know Your Numbers and Risk Factors: -Group verbal and written instruction about important numbers in your health.  Discussion of what are risk factors and how they play a role in the disease process.  Review of Cholesterol, Blood Pressure, Diabetes, and BMI and the role they play in your overall health.   Cardiac Rehab from 05/09/2018 in [facility] Cardiac and Pulmonary Rehab  Date  04/04/18  Educator  [facility]  Instruction Review Code  1- Verbalizes Understanding      Sleep Hygiene: -Provides group verbal and written instruction about how sleep can affect your health.  Define sleep hygiene, discuss sleep cycles and impact of sleep habits. Review good  sleep hygiene tips.    Other: -Provides group and verbal instruction on various topics (see comments)   Knowledge Questionnaire Score:   Core Components/Risk Factors/Patient Goals at Admission:   Core Components/Risk Factors/Patient Goals Review:  Goals and Risk Factor Review    Row Name 05/25/18 1020 06/15/18 1144 08/09/18 1653         Core Components/Risk  Factors/Patient Goals Review   Personal Goals Review  Weight Management/Obesity;Diabetes;Heart Failure;Hypertension;Lipids  Weight Management/Obesity;Diabetes;Heart Failure;Hypertension;Stress  Weight Management/Obesity;Heart Failure;Diabetes;Hypertension;Other     Review  [name] [name] is getting exercise riding his bike and doing spring cleaning.  He is monitoring his BG - 111 today.  he is still eating healthy.    [name] [name] continues to ride his bike at home.  He is taking meds as directed.  He feels he is geeting good rest and lots of "porch time".  [name] [name] has been monitoring BG and it has been 90-91 fasting.  He has talked to his Dr.  and has lowered insulin.  He is starting an injection (cant remember name ) that will allow him to reduce insulin usage.  His Dr also gave him a low BG kit should he get too low.  He still rides his bike when its not too hot.  He has not checked email but will to see if he wants to use the app.     Expected Outcomes  Short - Continue to exercise at home and eat heart healthy diet Long - maintain exercise on his own  Short - continue exercise at home Long - maintain heart healthy habits  Short - continue to monitor vitals at home Long - control risk factors independently        Core Components/Risk Factors/Patient Goals at Discharge (Final Review):  Goals and Risk Factor Review - 08/09/18 1653      Core Components/Risk Factors/Patient Goals Review   Personal Goals Review  Weight Management/Obesity;Heart Failure;Diabetes;Hypertension;Other    Review  [name] [name] has been monitoring BG and it has been 90-91 fasting.  He has talked to his Dr.  and has lowered insulin.  He is starting an injection (cant remember name ) that will allow him to reduce insulin usage.  His Dr also gave him a low BG kit should he get too low.  He still rides his bike when its not too hot.  He has not checked email but will to see if he wants to use the app.    Expected Outcomes  Short - continue to monitor vitals at home  Long - control risk factors independently       ITP Comments: ITP Comments    Row Name 05/22/18 1347 05/30/18 1335 07/10/18 1037 07/26/18 1553 09/19/18 1331   ITP Comments  Our programs are currently closed due to COVID 19.  Staff are following up with patients via email and phone calls.  30 day review completed. Continue with ITP unless directed changes by Medical Director at review  [name] [name] had a virtual visit with cardiologist yesterday.  He has been having SOB that has been worsening.  His Dr added viagra to help with this but hasnt started taking yet.  He has had some back and neck pain due to arthritis.  He has been taking muscle relaxer and topical cream.  This has helped reduce pain.    [name] [name] states he hasnt noticed a big change in SOB since adding the meds.  He hasnt been able to exercise as much since it has been raining.  He is willing to use the app for HT until we can meet in person.  30 day review cycle restarting  after being closed since March 16 because of  Covid 19 pandemic. Program opened to patients on July 6. Not all have returned. ITP updated and sent to Medical Director for review,changes as needed and signature   [place] Name 10/05/18 1116           ITP Comments  Phone call to see if continue Virtual, start back on site or discharge.  LMOM          Comments: discharge ITP - patient cannot return due to back pain

## 2018-10-30 NOTE — Progress Notes (Signed)
Discharge Progress Report  Patient Details  Name: [name] [name] MRN: [id number] Date of Birth: December 15, 1933 Referring Provider:     Cardiac Rehab from 03/19/2018 in [facility]  Referring Provider  [name] Rogue MD       Number of Visits: 12  Reason for Discharge:  Early Exit:  Personal  Smoking History:  Social History   Tobacco Use  Smoking Status Never Smoker  Smokeless Tobacco Never Used    Diagnosis:  No diagnosis found.  ADL UCSD:   Initial Exercise Prescription:   Discharge Exercise Prescription (Final Exercise Prescription Changes): Exercise Prescription Changes - 05/10/18 0900      Response to Exercise   Blood Pressure (Admit)  110/58    Blood Pressure (Exercise)  152/68    Blood Pressure (Exit)  132/60    Heart Rate (Admit)  63 bpm    Heart Rate (Exercise)  69 bpm    Heart Rate (Exit)  58 bpm    Rating of Perceived Exertion (Exercise)  11    Duration  Progress to 45 minutes of aerobic exercise without signs/symptoms of physical distress    Intensity  Other (comment)   use RPE     Progression   Progression  Continue to progress workloads to maintain intensity without signs/symptoms of physical distress.    Average METs  4      Resistance Training   Training Prescription  Yes    Weight  3 lb    Reps  10-15      Interval Training   Interval Training  No      NuStep   Level  4    SPM  80    Minutes  15    METs  3.6      REL-XR   Level  1    Speed  50    Minutes  15    METs  4.4       Functional Capacity:   Psychological, QOL, Others - Outcomes: PHQ 2/9: Depression screen [facility] 2/9 10/15/2018 10/10/2018 03/19/2018 03/05/2018 12/26/2017  Decreased Interest 0 0 0 0 0  Down, Depressed, Hopeless 0 0 0 0 0  PHQ - 2 Score 0 0 0 0 0  Altered sleeping - - 1 - -  Tired, decreased energy - - 1 - -  Change in appetite - - 1 - -  Feeling bad or failure about yourself  - - 0 - -  Trouble concentrating - - 0 - -  Moving  slowly or fidgety/restless - - 0 - -  Suicidal thoughts - - 0 - -  PHQ-9 Score - - 3 - -  Difficult doing work/chores - - - - -  Some recent data might be hidden    Quality of Life:   Personal Goals: Goals established at orientation with interventions provided to work toward goal.    Personal Goals Discharge: Goals and Risk Factor Review    Row Name 05/25/18 1020 06/15/18 1144 08/09/18 1653         Core Components/Risk Factors/Patient Goals Review   Personal Goals Review  Weight Management/Obesity;Diabetes;Heart Failure;Hypertension;Lipids  Weight Management/Obesity;Diabetes;Heart Failure;Hypertension;Stress  Weight Management/Obesity;Heart Failure;Diabetes;Hypertension;Other     Review  [name] [name] is getting exercise riding his bike and doing spring cleaning.  He is monitoring his BG - 111 today.  he is still eating healthy.    [name] [name] continues to ride his bike at home.  He is taking meds as directed.  He feels  he is geeting good rest and lots of "porch time".  [name] [name] has been monitoring BG and it has been 90-91 fasting.  He has talked to his Dr.  and has lowered insulin.  He is starting an injection (cant remember name ) that will allow him to reduce insulin usage.  His Dr also gave him a low BG kit should he get too low.  He still rides his bike when its not too hot.  He has not checked email but will to see if he wants to use the app.     Expected Outcomes  Short - Continue to exercise at home and eat heart healthy diet Long - maintain exercise on his own  Short - continue exercise at home Long - maintain heart healthy habits  Short - continue to monitor vitals at home Long - control risk factors independently        Exercise Goals and Review:   Exercise Goals Re-Evaluation: Exercise Goals Re-Evaluation    Row Name 05/10/18 0914 05/22/18 1346           Exercise Goal Re-Evaluation   Exercise Goals Review  Increase Physical Activity;Increase Strength and Stamina;Able to understand and use  rate of perceived exertion (RPE) scale;Able to understand and use Dyspnea scale;Knowledge and understanding of Target Heart Rate Range (THRR)  Increase Physical Activity;Increase Strength and Stamina;Able to understand and use rate of perceived exertion (RPE) scale;Knowledge and understanding of Target Heart Rate Range (THRR);Able to check pulse independently      Comments  [name] [name] was out quite a bit with sickness in February.  Staff will monitor progress as he gets back to a regular routine.  Our programs are currently closed due to COVID 19.  Staff are following up with patients via email and phone calls.      Expected Outcomes  Short - attend class consistently Long - improve overall MET level  -         Nutrition & Weight - Outcomes:    Nutrition:   Nutrition Discharge:   Education Questionnaire Score:   Goals reviewed with patient; copy given to patient.

## 2018-10-30 NOTE — Telephone Encounter (Signed)
[name] [name] has a ruptured disc in L1 - he is having pain injections and may need surgery.  He would like to discharge at this time.

## 2018-10-31 ENCOUNTER — Telehealth: Payer: Self-pay | Admitting: Oncology

## 2018-10-31 NOTE — Telephone Encounter (Signed)
PT said he did not want to R/S appt. Said he did not think he needed to be seen 10/31/2018-ltg

## 2018-11-03 ENCOUNTER — Other Ambulatory Visit: Payer: Self-pay | Admitting: Cardiovascular Disease

## 2018-11-07 DIAGNOSIS — M5416 Radiculopathy, lumbar region: Secondary | ICD-10-CM | POA: Diagnosis not present

## 2018-11-08 ENCOUNTER — Other Ambulatory Visit: Payer: Self-pay | Admitting: Family Medicine

## 2018-11-09 NOTE — Telephone Encounter (Signed)
Please contact the patient and see why he needs this refilled. It looks like it has been refilled by someone at [facility] and this may have been requested prior to the patient getting that refill.

## 2018-11-09 NOTE — Telephone Encounter (Signed)
Last OV 10/15/18

## 2018-11-13 DIAGNOSIS — M545 Low back pain: Secondary | ICD-10-CM | POA: Diagnosis not present

## 2018-11-15 DIAGNOSIS — M545 Low back pain: Secondary | ICD-10-CM | POA: Diagnosis not present

## 2018-11-19 DIAGNOSIS — M5416 Radiculopathy, lumbar region: Secondary | ICD-10-CM | POA: Diagnosis not present

## 2018-11-19 DIAGNOSIS — M5417 Radiculopathy, lumbosacral region: Secondary | ICD-10-CM | POA: Diagnosis not present

## 2018-11-23 DIAGNOSIS — M545 Low back pain: Secondary | ICD-10-CM | POA: Diagnosis not present

## 2018-11-26 DIAGNOSIS — M545 Low back pain: Secondary | ICD-10-CM | POA: Diagnosis not present

## 2018-11-27 ENCOUNTER — Encounter: Payer: Self-pay | Admitting: Family Medicine

## 2018-11-27 ENCOUNTER — Ambulatory Visit (INDEPENDENT_AMBULATORY_CARE_PROVIDER_SITE_OTHER): Payer: Medicare Other | Admitting: Family Medicine

## 2018-11-27 ENCOUNTER — Other Ambulatory Visit: Payer: Self-pay

## 2018-11-27 DIAGNOSIS — R634 Abnormal weight loss: Secondary | ICD-10-CM

## 2018-11-27 DIAGNOSIS — R42 Dizziness and giddiness: Secondary | ICD-10-CM | POA: Diagnosis not present

## 2018-11-27 DIAGNOSIS — M545 Low back pain, unspecified: Secondary | ICD-10-CM

## 2018-11-27 DIAGNOSIS — G8929 Other chronic pain: Secondary | ICD-10-CM | POA: Diagnosis not present

## 2018-11-27 NOTE — Progress Notes (Signed)
Virtual Visit via video Note  This visit type was conducted due to national recommendations for restrictions regarding the COVID-19 pandemic (e.g. social distancing).  This format is felt to be most appropriate for this patient at this time.  All issues noted in this document were discussed and addressed.  No physical exam was performed (except for noted visual exam findings with Video Visits).   I connected with Shirline Frees today at  1:45 PM EDT by a video enabled telemedicine application or telephone and verified that I am speaking with the correct person using two identifiers. Location patient: home Location provider: work  Persons participating in the virtual visit: patient, provider, Thayer Headings (wife)  I discussed the limitations, risks, security and privacy concerns of performing an evaluation and management service by telephone and the availability of in person appointments. I also discussed with the patient that there may be a patient responsible charge related to this service. The patient expressed understanding and agreed to proceed.   Reason for visit: same day visit  HPI: Weight loss, decreased appetite, decreased energy: Patient reports this has been going on for 3 to 4 months.  This has worsened over the last 6 weeks.  He notes not have much of an appetite.  He is down to 204 pounds from greater than 220 several months ago.  He notes no depression, anxiety, abdominal pain, nausea, vomiting, diarrhea, blood in stool, fevers, cough, congestion, itching, rash, chest pain, shortness of breath, constipation, or change in his chronic joint pain.  He notes occasionally feeling damp at night though no drenching night sweats.  He is on pain medication for chronic back pain though he has tried to space that out and wondered if that would be contributing to how he feels.  CT scan in July did reveal mild intra-and extrahepatic biliary duct prominence that could have been related to his prior  cholecystectomy.  No other findings to account for his above symptoms.  His LFTs shortly after that CT scan were normal.  Chronic back pain: He is following with a pain specialist and had a ESI a week or so ago.  This did help for about 2 days though then the pain returned.  He has been taking hydrocodone about 3 times daily.  He wonders about switching back to tramadol.  He has been doing physical therapy.  He does have follow-up with the pain specialist in October.  Vertigo: He has had this intermittently.  Last occurred last week.  Last for about 5 days with room spinning intermittently.  It has resolved at this time.   ROS: See pertinent positives and negatives per HPI.  Past Medical History:  Diagnosis Date   BPH (benign prostatic hyperplasia)    Cancer ([place])    skin   Cataract    Coronary artery disease    a. 4v CABG 01/2005; b. cath 06/05/13 showed patent grafts: LIMA-LAD, VG-D, VG-OM3, VG-dRCA, nl filling pressures, nl LV fxn   Diastolic dysfunction    a. echo 09/621: nl systolic fxn, mild LVH, diastolic relaxation abnormality, mildly enlarged LA, mild Ao insufficiency   Dyspnea    with exertion   ED (erectile dysfunction)    GERD (gastroesophageal reflux disease)    Hyperlipidemia    Hypertension    IDDM (insulin dependent diabetes mellitus) ([place]) 2000   Osteoarthritis    Perirectal fistula    Pneumonia 12/2016   Tubular adenoma of colon 07/2012   Vertigo     Past Surgical  History:  Procedure Laterality Date   BACK SURGERY     CARDIAC CATHETERIZATION  06/05/2013   [facility]    CARDIAC CATHETERIZATION N/A 09/24/2015   Procedure: Right Heart Cath and Coronary/Graft Angiography;  Surgeon: Minna Merritts, MD;  Location: [place] [facility];  Service: Cardiovascular;  Laterality: N/A;   CATARACT EXTRACTION  sept 2013   right   CATARACT EXTRACTION W/PHACO Left 03/28/2017   Procedure: CATARACT EXTRACTION PHACO AND INTRAOCULAR LENS PLACEMENT (IOC);   Surgeon: Birder Robson, MD;  Location: [facility] ORS;  Service: Ophthalmology;  Laterality: Left;  [place] 00:42.0 AP% 15.0 CDE 6.31 Fluid Pack lot # [id number]   CHOLECYSTECTOMY  05/15/2011   Procedure: LAPAROSCOPIC CHOLECYSTECTOMY;  Surgeon: Rolm Bookbinder, MD;  Location: [place] [place] [facility];  Service: General;  Laterality: N/A;   COLONOSCOPY W/ POLYPECTOMY     CORONARY ARTERY BYPASS GRAFT  2007   x 4   EYE SURGERY     FOOT SURGERY  2012   right foot   JOINT REPLACEMENT  8/10   Right THR--[name]   LEFT AND RIGHT HEART CATHETERIZATION WITH CORONARY/GRAFT ANGIOGRAM N/A 06/05/2013   Procedure: LEFT AND RIGHT HEART CATHETERIZATION WITH Beatrix Fetters;  Surgeon: Peter M Martinique, MD;  Location: [facility] C[facility] LAB;  Service: Cardiovascular;  Laterality: N/A;   LUMBAR LAMINECTOMY  1989   Prostate photovaporization  5/16   Dr Budd Palmer   RIGHT/LEFT HEART CATH AND CORONARY/GRAFT ANGIOGRAPHY N/A 01/05/2018   Procedure: RIGHT/LEFT HEART CATH AND CORONARY/GRAFT ANGIOGRAPHY;  Surgeon: Minna Merritts, MD;  Location: [place] [facility];  Service: Cardiovascular;  Laterality: N/A;    Family History  Problem Relation Age of Onset   Lung cancer Mother    Heart disease Father    Colon cancer Neg Hx    Breast cancer Neg Hx     SOCIAL HX: Non-smoker   Current Outpatient Medications:    acetaminophen (TYLENOL) 500 MG tablet, Take 1,000 mg by mouth 2 (two) times daily., Disp: , Rfl:    amLODipine (NORVASC) 5 MG tablet, TAKE ONE TABLET BY MOUTH TWICE DAILY, Disp: 90 tablet, Rfl: 3   aspirin EC 81 MG tablet, Take 81 mg by mouth daily., Disp: , Rfl:    BD INSULIN SYRINGE ULTRAFINE 31G X 15/64" 0.3 ML MISC, USE AS DIRECTED, Disp: 100 each, Rfl: 3   blood glucose meter kit and supplies KIT, OneTouch Verio w/ Device Kit OneTouch Verio VI STRP OneTouch Delica Lancet Dev misc OneTouch Delica Lancets 56L or Fine  Please check CBGs fasting and 2 other times during the day  Dx Code E11.8, Disp: 1  each, Rfl: 0   carvedilol (COREG) 12.5 MG tablet, TAKE 1 TABLET BY MOUTH TWICE DAILY (Patient taking differently: Take 12.5 mg by mouth 2 (two) times a day. ), Disp: 180 tablet, Rfl: 4   cyclobenzaprine (FLEXERIL) 10 MG tablet, , Disp: , Rfl:    finasteride (PROSCAR) 5 MG tablet, Take 1 tablet (5 mg total) by mouth daily. (Patient taking differently: Take 5 mg by mouth at bedtime. ), Disp: 90 tablet, Rfl: 1   HYDROcodone-acetaminophen (NORCO/VICODIN) 5-325 MG tablet, , Disp: , Rfl:    Insulin Pen Needle (BD PEN NEEDLE NANO U/F) 32G X 4 MM MISC, USE AS DIRECTED, Disp: 100 each, Rfl: 11   omeprazole (PRILOSEC) 20 MG capsule, TAKE 1 CAPSULE BY MOUTH TWICE DAILY (Patient taking differently: Take 20 mg by mouth 2 (two) times a day. ), Disp: 60 capsule, Rfl: 2   ondansetron (ZOFRAN) 4  MG tablet, Take 1 tablet (4 mg total) by mouth every 8 (eight) hours as needed for nausea or vomiting., Disp: 10 tablet, Rfl: 0   potassium chloride (K-DUR) 10 MEQ tablet, Take 1 tablet (10 meq) by mouth twice daily 6 days a week (Patient taking differently: Take 10 mEq by mouth 2 (two) times daily. ), Disp: , Rfl:    rosuvastatin (CRESTOR) 20 MG tablet, Take 1 tablet (20 mg total) by mouth daily. (Patient taking differently: Take 20 mg by mouth at bedtime. ), Disp: 90 tablet, Rfl: 4   TOUJEO SOLOSTAR 300 UNIT/ML SOPN, INJECT 50-60 UNITS INTO THE SKIN DAILY .INCREASE BY 1 UNITS/DAY UNTILFBS ARE BETWEEN 80-130. DO NOT EXCEED 60 UNITS PER DAY. (Patient taking differently: Inject 46-48 Units into the skin daily. ), Disp: 4.5 mL, Rfl: 6   Vitamin D, Cholecalciferol, 1000 units TABS, Take 1,000 Units by mouth daily. , Disp: , Rfl:    Vitamin D, Ergocalciferol, (DRISDOL) 1.25 MG (50000 UT) CAPS capsule, Take by mouth., Disp: , Rfl:    zolpidem (AMBIEN) 5 MG tablet, Take 1 tablet (5 mg total) by mouth at bedtime as needed for sleep. Stop trazadone not helpful, Disp: 30 tablet, Rfl: 0   traMADol (ULTRAM) 50 MG tablet,  Take 1 tablet (50 mg total) by mouth every 8 (eight) hours as needed for up to 5 days., Disp: 20 tablet, Rfl: 0  EXAM:  VITALS per patient if applicable: None.  GENERAL: alert, oriented, appears well and in no acute distress  HEENT: atraumatic, conjunttiva clear, no obvious abnormalities on inspection of external nose and ears  NECK: normal movements of the head and neck  LUNGS: on inspection no signs of respiratory distress, breathing rate appears normal, no obvious gross SOB, gasping or wheezing  CV: no obvious cyanosis  MS: moves all visible extremities without noticeable abnormality  PSYCH/NEURO: pleasant and cooperative, no obvious depression or anxiety, speech and thought processing grossly intact  ASSESSMENT AND PLAN:  Discussed the following assessment and plan:  Weight loss Undetermined cause.  Potentially could be related to the chronic pain and pain medications he is been taking though could be related to numerous other causes.  We will have him come in for lab work to evaluate this further.  Consider further evaluation based off of labs.  Chronic low back pain Chronic issue.  He will continue to follow with his specialist.  I discussed we could switch him back to tramadol.  This is been sent to his pharmacy.  He is aware that he should not take the hydrocodone and the tramadol together.  Vertigo Chronic intermittent issue.  He will monitor for recurrence.    I discussed the assessment and treatment plan with the patient. The patient was provided an opportunity to ask questions and all were answered. The patient agreed with the plan and demonstrated an understanding of the instructions.   The patient was advised to call back or seek an in-person evaluation if the symptoms worsen or if the condition fails to improve as anticipated.    [name], MD

## 2018-11-28 DIAGNOSIS — M545 Low back pain: Secondary | ICD-10-CM | POA: Diagnosis not present

## 2018-12-01 ENCOUNTER — Telehealth: Payer: Self-pay | Admitting: Family Medicine

## 2018-12-01 DIAGNOSIS — R634 Abnormal weight loss: Secondary | ICD-10-CM | POA: Insufficient documentation

## 2018-12-01 MED ORDER — TRAMADOL HCL 50 MG PO TABS: 50.0000 mg | ORAL_TABLET | Freq: Three times a day (TID) | ORAL | 0 refills | Status: DC | PRN

## 2018-12-01 NOTE — Assessment & Plan Note (Signed)
Undetermined cause.  Potentially could be related to the chronic pain and pain medications he is been taking though could be related to numerous other causes.  We will have him come in for lab work to evaluate this further.  Consider further evaluation based off of labs.

## 2018-12-01 NOTE — Assessment & Plan Note (Signed)
Chronic intermittent issue.  He will monitor for recurrence.

## 2018-12-01 NOTE — Assessment & Plan Note (Signed)
Chronic issue.  He will continue to follow with his specialist.  I discussed we could switch him back to tramadol.  This is been sent to his pharmacy.  He is aware that he should not take the hydrocodone and the tramadol together.

## 2018-12-01 NOTE — Telephone Encounter (Signed)
This patient was seen earlier this week.  He needs to be scheduled for labs and a chest x-ray.  Please contact him to get these scheduled.  Thanks.

## 2018-12-03 DIAGNOSIS — M545 Low back pain: Secondary | ICD-10-CM | POA: Diagnosis not present

## 2018-12-05 DIAGNOSIS — M545 Low back pain: Secondary | ICD-10-CM | POA: Diagnosis not present

## 2018-12-05 NOTE — Telephone Encounter (Signed)
Called and spoke with the patient and scheduled a lab and chest pain for this Friday with patient.  Jeron Grahn,cma

## 2018-12-07 ENCOUNTER — Other Ambulatory Visit: Payer: Medicare Other

## 2018-12-07 ENCOUNTER — Other Ambulatory Visit: Payer: Self-pay

## 2018-12-07 ENCOUNTER — Other Ambulatory Visit: Payer: Self-pay | Admitting: Family Medicine

## 2018-12-07 ENCOUNTER — Other Ambulatory Visit (INDEPENDENT_AMBULATORY_CARE_PROVIDER_SITE_OTHER): Payer: Medicare Other

## 2018-12-07 ENCOUNTER — Ambulatory Visit (INDEPENDENT_AMBULATORY_CARE_PROVIDER_SITE_OTHER): Payer: Medicare Other

## 2018-12-07 DIAGNOSIS — R8290 Unspecified abnormal findings in urine: Secondary | ICD-10-CM | POA: Diagnosis not present

## 2018-12-07 DIAGNOSIS — J449 Chronic obstructive pulmonary disease, unspecified: Secondary | ICD-10-CM | POA: Diagnosis not present

## 2018-12-07 DIAGNOSIS — R634 Abnormal weight loss: Secondary | ICD-10-CM

## 2018-12-07 LAB — POCT URINALYSIS DIPSTICK
Glucose, UA: POSITIVE — AB
Leukocytes, UA: NEGATIVE
Nitrite, UA: NEGATIVE
Protein, UA: POSITIVE — AB
Spec Grav, UA: >=1.03 — AB (ref 1.010–1.025)
Urobilinogen, UA: 0.2 E.U./dL
pH, UA: 5 (ref 5.0–8.0)

## 2018-12-07 LAB — COMPREHENSIVE METABOLIC PANEL
ALT: 9 U/L (ref 0–53)
AST: 12 U/L (ref 0–37)
Albumin: 3.7 g/dL (ref 3.5–5.2)
Alkaline Phosphatase: 114 U/L (ref 39–117)
BUN: 13 mg/dL (ref 6–23)
CO2: 24 mEq/L (ref 19–32)
Calcium: 8.9 mg/dL (ref 8.4–10.5)
Chloride: 109 mEq/L (ref 96–112)
Creatinine, Ser: 1.19 mg/dL (ref 0.40–1.50)
GFR: 58.37 mL/min — ABNORMAL LOW (ref >60.00)
Glucose, Bld: 293 mg/dL — ABNORMAL HIGH (ref 70–99)
Potassium: 4 mEq/L (ref 3.5–5.1)
Sodium: 140 mEq/L (ref 135–145)
Total Bilirubin: 1 mg/dL (ref 0.2–1.2)
Total Protein: 5.9 g/dL — ABNORMAL LOW (ref 6.0–8.3)

## 2018-12-07 LAB — CBC WITH DIFFERENTIAL/PLATELET
Basophils Absolute: 0.1 10*3/uL (ref 0.0–0.1)
Basophils Relative: 0.9 % (ref 0.0–3.0)
Eosinophils Absolute: 0.3 10*3/uL (ref 0.0–0.7)
Eosinophils Relative: 4.9 % (ref 0.0–5.0)
HCT: 35.4 % — ABNORMAL LOW (ref 39.0–52.0)
Hemoglobin: 12.5 g/dL — ABNORMAL LOW (ref 13.0–17.0)
Lymphocytes Relative: 18.2 % (ref 12.0–46.0)
Lymphs Abs: 1.1 10*3/uL (ref 0.7–4.0)
MCHC: 35.4 g/dL (ref 30.0–36.0)
MCV: 93 fl (ref 78.0–100.0)
Monocytes Absolute: 0.6 10*3/uL (ref 0.1–1.0)
Monocytes Relative: 9.7 % (ref 3.0–12.0)
Neutro Abs: 3.9 10*3/uL (ref 1.4–7.7)
Neutrophils Relative %: 66.3 % (ref 43.0–77.0)
Platelets: 113 10*3/uL — ABNORMAL LOW (ref 150.0–400.0)
RBC: 3.81 Mil/uL — ABNORMAL LOW (ref 4.22–5.81)
RDW: 13.2 % (ref 11.5–15.5)
WBC: 5.9 10*3/uL (ref 4.0–10.5)

## 2018-12-07 LAB — URINALYSIS, MICROSCOPIC ONLY
RBC / HPF: NONE SEEN (ref 0–?)
WBC, UA: NONE SEEN (ref 0–?)

## 2018-12-07 LAB — SEDIMENTATION RATE: Sed Rate: 4 mm/hr (ref 0–20)

## 2018-12-07 LAB — TSH: TSH: 0.99 u[IU]/mL (ref 0.35–4.50)

## 2018-12-07 NOTE — Addendum Note (Signed)
Addended by: Leeanne Rio on: 12/07/2018 12:47 PM   Modules accepted: Orders

## 2018-12-10 DIAGNOSIS — M545 Low back pain: Secondary | ICD-10-CM | POA: Diagnosis not present

## 2018-12-10 LAB — HIV ANTIBODY (ROUTINE TESTING W REFLEX): HIV 1&2 Ab, 4th Generation: NONREACTIVE

## 2018-12-13 ENCOUNTER — Other Ambulatory Visit: Payer: Self-pay | Admitting: Family Medicine

## 2018-12-13 DIAGNOSIS — R809 Proteinuria, unspecified: Secondary | ICD-10-CM

## 2018-12-13 DIAGNOSIS — M545 Low back pain: Secondary | ICD-10-CM | POA: Diagnosis not present

## 2018-12-13 DIAGNOSIS — D649 Anemia, unspecified: Secondary | ICD-10-CM

## 2018-12-17 DIAGNOSIS — M545 Low back pain: Secondary | ICD-10-CM | POA: Diagnosis not present

## 2018-12-19 ENCOUNTER — Other Ambulatory Visit (INDEPENDENT_AMBULATORY_CARE_PROVIDER_SITE_OTHER): Payer: Medicare Other

## 2018-12-19 ENCOUNTER — Other Ambulatory Visit: Payer: Self-pay

## 2018-12-19 DIAGNOSIS — R809 Proteinuria, unspecified: Secondary | ICD-10-CM | POA: Diagnosis not present

## 2018-12-19 DIAGNOSIS — N179 Acute kidney failure, unspecified: Secondary | ICD-10-CM

## 2018-12-19 DIAGNOSIS — D649 Anemia, unspecified: Secondary | ICD-10-CM | POA: Diagnosis not present

## 2018-12-19 DIAGNOSIS — D696 Thrombocytopenia, unspecified: Secondary | ICD-10-CM

## 2018-12-19 DIAGNOSIS — I25118 Atherosclerotic heart disease of native coronary artery with other forms of angina pectoris: Secondary | ICD-10-CM

## 2018-12-19 DIAGNOSIS — M545 Low back pain: Secondary | ICD-10-CM | POA: Diagnosis not present

## 2018-12-19 LAB — COMPREHENSIVE METABOLIC PANEL
ALT: 10 U/L (ref 0–53)
AST: 13 U/L (ref 0–37)
Albumin: 3.7 g/dL (ref 3.5–5.2)
Alkaline Phosphatase: 117 U/L (ref 39–117)
BUN: 11 mg/dL (ref 6–23)
CO2: 25 mEq/L (ref 19–32)
Calcium: 8.7 mg/dL (ref 8.4–10.5)
Chloride: 106 mEq/L (ref 96–112)
Creatinine, Ser: 1.12 mg/dL (ref 0.40–1.50)
GFR: 62.59 mL/min (ref >60.00)
Glucose, Bld: 235 mg/dL — ABNORMAL HIGH (ref 70–99)
Potassium: 3.8 mEq/L (ref 3.5–5.1)
Sodium: 139 mEq/L (ref 135–145)
Total Bilirubin: 0.9 mg/dL (ref 0.2–1.2)
Total Protein: 5.7 g/dL — ABNORMAL LOW (ref 6.0–8.3)

## 2018-12-19 LAB — VITAMIN B12: Vitamin B-12: 207 pg/mL — ABNORMAL LOW (ref 211–911)

## 2018-12-19 LAB — FOLATE: Folate: 13 ng/mL (ref >5.9)

## 2018-12-19 LAB — IBC + FERRITIN
Ferritin: 68 ng/mL (ref 22.0–322.0)
Iron: 93 ug/dL (ref 42–165)
Saturation Ratios: 35.1 % (ref 20.0–50.0)
Transferrin: 189 mg/dL — ABNORMAL LOW (ref 212.0–360.0)

## 2018-12-20 LAB — PROTEIN / CREATININE RATIO, URINE
Creatinine, Urine: 121 mg/dL (ref 20–320)
Protein/Creat Ratio: 314 mg/g creat — ABNORMAL HIGH (ref 22–128)
Protein/Creatinine Ratio: 0.314 mg/mg creat — ABNORMAL HIGH (ref 0.022–0.12)
Total Protein, Urine: 38 mg/dL — ABNORMAL HIGH (ref 5–25)

## 2018-12-21 ENCOUNTER — Telehealth: Payer: Self-pay | Admitting: *Deleted

## 2018-12-21 ENCOUNTER — Ambulatory Visit: Payer: Self-pay | Admitting: *Deleted

## 2018-12-21 NOTE — Telephone Encounter (Signed)
Opened this portion of the chart by mistake.   See Triage call documentation.

## 2018-12-21 NOTE — Telephone Encounter (Signed)
Pt called in and was given the message from Dr. [name] regarding his lab results dated 12/20/2018 at 10:27 AM.  I scheduled him for his first B12 injection for 12/26/2018 at 11:15.  He is fine with seeing a nephrologist whoever Dr. [name] recommends.  He does not like Ensure or those types of drinks.  Is there something else he can do in place of Ensure to get his blood protein levels up?  I forwarded these answers to Dr. Ellen Henri office.

## 2018-12-24 ENCOUNTER — Ambulatory Visit
Admission: RE | Admit: 2018-12-24 | Discharge: 2018-12-24 | Disposition: A | Discharge status: Home / Self Care | Payer: Medicare Other | Source: Ambulatory Visit | Attending: Student | Admitting: Student

## 2018-12-24 ENCOUNTER — Other Ambulatory Visit: Payer: Self-pay | Admitting: Student

## 2018-12-24 ENCOUNTER — Other Ambulatory Visit: Payer: Self-pay

## 2018-12-24 DIAGNOSIS — L98491 Non-pressure chronic ulcer of skin of other sites limited to breakdown of skin: Secondary | ICD-10-CM | POA: Diagnosis not present

## 2018-12-24 DIAGNOSIS — R21 Rash and other nonspecific skin eruption: Secondary | ICD-10-CM | POA: Diagnosis not present

## 2018-12-24 DIAGNOSIS — M7989 Other specified soft tissue disorders: Secondary | ICD-10-CM | POA: Diagnosis not present

## 2018-12-24 DIAGNOSIS — R600 Localized edema: Secondary | ICD-10-CM | POA: Diagnosis not present

## 2018-12-25 NOTE — Telephone Encounter (Signed)
Sent message to PCP via result note.

## 2018-12-26 ENCOUNTER — Ambulatory Visit: Payer: Medicare Other

## 2018-12-27 ENCOUNTER — Other Ambulatory Visit: Payer: Self-pay

## 2018-12-27 ENCOUNTER — Ambulatory Visit (INDEPENDENT_AMBULATORY_CARE_PROVIDER_SITE_OTHER): Payer: Medicare Other

## 2018-12-27 DIAGNOSIS — E538 Deficiency of other specified B group vitamins: Secondary | ICD-10-CM

## 2018-12-27 MED ORDER — CYANOCOBALAMIN 1000 MCG/ML IJ SOLN
1000.0000 ug | Freq: Once | INTRAMUSCULAR | Status: AC
  Administered 2018-12-27: 1000 ug via INTRAMUSCULAR

## 2018-12-27 NOTE — Progress Notes (Signed)
Patient presented today for 1st B12 injection (See Lab Note for 12/19/18).  Administered IM in right deltoid.  Patient tolerated well with no signs of distress.

## 2018-12-28 ENCOUNTER — Ambulatory Visit: Payer: Medicare Other

## 2018-12-28 DIAGNOSIS — M545 Low back pain: Secondary | ICD-10-CM | POA: Diagnosis not present

## 2018-12-31 DIAGNOSIS — M545 Low back pain: Secondary | ICD-10-CM | POA: Diagnosis not present

## 2019-01-02 DIAGNOSIS — M545 Low back pain: Secondary | ICD-10-CM | POA: Diagnosis not present

## 2019-01-03 ENCOUNTER — Ambulatory Visit (INDEPENDENT_AMBULATORY_CARE_PROVIDER_SITE_OTHER): Payer: Medicare Other

## 2019-01-03 ENCOUNTER — Other Ambulatory Visit: Payer: Self-pay

## 2019-01-03 DIAGNOSIS — E538 Deficiency of other specified B group vitamins: Secondary | ICD-10-CM

## 2019-01-03 MED ORDER — CYANOCOBALAMIN 1000 MCG/ML IJ SOLN
1000.0000 ug | Freq: Once | INTRAMUSCULAR | Status: AC
  Administered 2019-01-03: 1000 ug via INTRAMUSCULAR

## 2019-01-03 NOTE — Progress Notes (Signed)
Patient presented today for B12 injection.  Administered IM in the left deltoid.  Patient tolerated well with no signs of distress.   

## 2019-01-04 DIAGNOSIS — M545 Low back pain: Secondary | ICD-10-CM | POA: Diagnosis not present

## 2019-01-04 DIAGNOSIS — M5416 Radiculopathy, lumbar region: Secondary | ICD-10-CM | POA: Diagnosis not present

## 2019-01-04 DIAGNOSIS — M47816 Spondylosis without myelopathy or radiculopathy, lumbar region: Secondary | ICD-10-CM | POA: Diagnosis not present

## 2019-01-05 ENCOUNTER — Other Ambulatory Visit: Payer: Self-pay | Admitting: Family Medicine

## 2019-01-05 DIAGNOSIS — R809 Proteinuria, unspecified: Secondary | ICD-10-CM

## 2019-01-07 ENCOUNTER — Encounter: Payer: Self-pay | Admitting: Family Medicine

## 2019-01-07 ENCOUNTER — Other Ambulatory Visit: Payer: Self-pay

## 2019-01-07 ENCOUNTER — Ambulatory Visit (INDEPENDENT_AMBULATORY_CARE_PROVIDER_SITE_OTHER): Payer: Medicare Other | Admitting: Family Medicine

## 2019-01-07 VITALS — BP 130/60 | HR 71 | Temp 96.4°F | Ht 73.0 in | Wt 212.8 lb

## 2019-01-07 DIAGNOSIS — E538 Deficiency of other specified B group vitamins: Secondary | ICD-10-CM

## 2019-01-07 DIAGNOSIS — I10 Essential (primary) hypertension: Secondary | ICD-10-CM

## 2019-01-07 DIAGNOSIS — F5101 Primary insomnia: Secondary | ICD-10-CM

## 2019-01-07 DIAGNOSIS — R0789 Other chest pain: Secondary | ICD-10-CM | POA: Diagnosis not present

## 2019-01-07 DIAGNOSIS — E118 Type 2 diabetes mellitus with unspecified complications: Secondary | ICD-10-CM | POA: Diagnosis not present

## 2019-01-07 DIAGNOSIS — R634 Abnormal weight loss: Secondary | ICD-10-CM

## 2019-01-07 DIAGNOSIS — D649 Anemia, unspecified: Secondary | ICD-10-CM

## 2019-01-07 DIAGNOSIS — M545 Low back pain: Secondary | ICD-10-CM | POA: Diagnosis not present

## 2019-01-08 DIAGNOSIS — R0789 Other chest pain: Secondary | ICD-10-CM | POA: Insufficient documentation

## 2019-01-08 NOTE — Progress Notes (Signed)
[name], MD Phone: [phone]  [name] [name] is a 85 y.o. male who presents today for follow-up.  Weight loss: Patient's weight has trended up slightly.  His appetite has been significantly better since coming off of oxycodone.  He is eating quite a bit better.  His work-up previously for weight loss was negative for cause though he did have some proteinuria for which he has been referred to nephrology.  Diabetes: He would like a referral to a new endocrinologist.  He has been taking Toujeo 49 units once daily.  No polyuria or polydipsia.  No recent hypoglycemia.  Hypertension: Not checking blood pressures.  Taking amlodipine and carvedilol.  No chest pressure or shortness of breath.  Atypical chest pain: Patient notes focal left-sided shooting discomfort that was nonexertional.  Occurred near the upper left sternal border.  Has not recurred.  No associated shortness of breath.  No radiation.  No other symptoms with this.  Sleep difficulty: Patient notes he has difficulty sleeping.  He wakes up early and cannot get back to sleep.  He started zinc and vitamin D though notes some loose stools with the zinc.  He has been trying melatonin as well.  He goes to bed at 11 PM and watches TV the hour prior to bed.  He tried trazodone which was not beneficial.  He denies depression or anxiety.  No significant alcohol intake.  He does drink some caffeine with dinner.  Social History   Tobacco Use  Smoking Status Never Smoker  Smokeless Tobacco Never Used     ROS see history of present illness  Objective  Physical Exam Vitals:   01/07/19 1358  BP: 130/60  Pulse: 71  Temp: (!) 96.4 F (35.8 C)  SpO2: 99%    BP Readings from Last 3 Encounters:  01/07/19 130/60  10/17/18 (!) 181/55  09/08/18 (!) 112/48   Wt Readings from Last 3 Encounters:  01/07/19 212 lb 12.8 oz (96.5 kg)  11/27/18 204 lb (92.5 kg)  10/17/18 210 lb 1.6 oz (95.3 kg)    Physical Exam Constitutional:       General: He is not in acute distress.    Appearance: He is not diaphoretic.  Cardiovascular:     Rate and Rhythm: Normal rate and regular rhythm.     Heart sounds: Normal heart sounds.  Pulmonary:     Effort: Pulmonary effort is normal.     Breath sounds: Normal breath sounds.  Chest:     Chest wall: No tenderness.  Musculoskeletal:     Right lower leg: No edema.     Left lower leg: No edema.  Skin:    General: Skin is warm and dry.  Neurological:     Mental Status: He is alert.      Assessment/Plan: Please see individual problem list.  Essential hypertension Adequately controlled.  Continue current regimen.  Atypical chest pain Pain is not consistent with a cardiac cause.  Potentially nerve discomfort or musculoskeletal.  If it recurs or changes he will let us know.  If he develops chest pressure or shortness of breath he will seek medical attention.  Insomnia Discussed sleep hygiene measures.  He will eliminate screens and eliminate nighttime caffeine and see if that helps.  If not beneficial consider additional treatment.  Type 2 diabetes mellitus with complication, without long-term current use of insulin (HCC) Continue current regimen.  Check A1c.  Refer to local endocrinology.  Weight loss Weight has trended up.  I suspect this  is related to poor appetite related to taking narcotics.  He will continue to monitor his weight.   Orders Placed This Encounter  Procedures  . HgB A1c    Standing Status:   Future    Standing Expiration Date:   01/07/2020  . Lipid panel    Standing Status:   Future    Standing Expiration Date:   01/07/2020  . B12    Standing Status:   Future    Standing Expiration Date:   01/07/2020  . CBC w/Diff    Standing Status:   Future    Standing Expiration Date:   01/07/2020  . Ambulatory referral to Endocrinology    Referral Priority:   Routine    Referral Type:   Consultation    Referral Reason:   Specialty Services Required    Number  of Visits Requested:   1    No orders of the defined types were placed in this encounter.    [name], MD [place]

## 2019-01-08 NOTE — Assessment & Plan Note (Signed)
Discussed sleep hygiene measures.  He will eliminate screens and eliminate nighttime caffeine and see if that helps.  If not beneficial consider additional treatment.

## 2019-01-08 NOTE — Assessment & Plan Note (Signed)
Pain is not consistent with a cardiac cause.  Potentially nerve discomfort or musculoskeletal.  If it recurs or changes he will let us know.  If he develops chest pressure or shortness of breath he will seek medical attention.

## 2019-01-08 NOTE — Assessment & Plan Note (Signed)
Adequately controlled.  Continue current regimen. 

## 2019-01-08 NOTE — Assessment & Plan Note (Signed)
Continue current regimen.  Check A1c.  Refer to local endocrinology.

## 2019-01-08 NOTE — Assessment & Plan Note (Signed)
Weight has trended up.  I suspect this is related to poor appetite related to taking narcotics.  He will continue to monitor his weight.

## 2019-01-09 DIAGNOSIS — M545 Low back pain: Secondary | ICD-10-CM | POA: Diagnosis not present

## 2019-01-10 ENCOUNTER — Telehealth: Payer: Self-pay

## 2019-01-10 NOTE — Telephone Encounter (Signed)
Copied from [place] [id number]. Topic: General - Inquiry >> Jan 10, 2019  9:59 AM Richardo Priest, NT wrote: Reason for CRM: Pt called in stating he would like to speak with CMA in regards to B-12 shot. Pt also stated he was waiting for referral for Dr.Melissa Solem for endo.  Please advise.

## 2019-01-10 NOTE — Telephone Encounter (Signed)
I called and left a message for the patient that I will call him back.  Nina,cma

## 2019-01-11 DIAGNOSIS — L570 Actinic keratosis: Secondary | ICD-10-CM | POA: Diagnosis not present

## 2019-01-11 DIAGNOSIS — D225 Melanocytic nevi of trunk: Secondary | ICD-10-CM | POA: Diagnosis not present

## 2019-01-11 DIAGNOSIS — D692 Other nonthrombocytopenic purpura: Secondary | ICD-10-CM | POA: Diagnosis not present

## 2019-01-11 DIAGNOSIS — C44319 Basal cell carcinoma of skin of other parts of face: Secondary | ICD-10-CM | POA: Diagnosis not present

## 2019-01-11 DIAGNOSIS — Z85828 Personal history of other malignant neoplasm of skin: Secondary | ICD-10-CM | POA: Diagnosis not present

## 2019-01-14 ENCOUNTER — Other Ambulatory Visit: Payer: Self-pay | Admitting: Family Medicine

## 2019-01-14 DIAGNOSIS — M545 Low back pain: Secondary | ICD-10-CM | POA: Diagnosis not present

## 2019-01-16 DIAGNOSIS — M545 Low back pain: Secondary | ICD-10-CM | POA: Diagnosis not present

## 2019-01-21 DIAGNOSIS — M545 Low back pain: Secondary | ICD-10-CM | POA: Diagnosis not present

## 2019-01-22 ENCOUNTER — Other Ambulatory Visit: Payer: Self-pay | Admitting: Internal Medicine

## 2019-01-22 DIAGNOSIS — G4700 Insomnia, unspecified: Secondary | ICD-10-CM

## 2019-01-22 MED ORDER — ZOLPIDEM TARTRATE 5 MG PO TABS: 5.0000 mg | ORAL_TABLET | Freq: Every evening | ORAL | 0 refills | Status: DC | PRN

## 2019-01-29 ENCOUNTER — Other Ambulatory Visit: Payer: Self-pay

## 2019-02-04 ENCOUNTER — Other Ambulatory Visit (INDEPENDENT_AMBULATORY_CARE_PROVIDER_SITE_OTHER): Payer: Medicare Other

## 2019-02-04 ENCOUNTER — Other Ambulatory Visit: Payer: Self-pay

## 2019-02-04 DIAGNOSIS — E118 Type 2 diabetes mellitus with unspecified complications: Secondary | ICD-10-CM | POA: Diagnosis not present

## 2019-02-04 DIAGNOSIS — I10 Essential (primary) hypertension: Secondary | ICD-10-CM

## 2019-02-04 DIAGNOSIS — D649 Anemia, unspecified: Secondary | ICD-10-CM

## 2019-02-04 DIAGNOSIS — E538 Deficiency of other specified B group vitamins: Secondary | ICD-10-CM | POA: Diagnosis not present

## 2019-02-04 LAB — VITAMIN B12: Vitamin B-12: 274 pg/mL (ref 211–911)

## 2019-02-04 LAB — CBC WITH DIFFERENTIAL/PLATELET
Basophils Absolute: 0.1 10*3/uL (ref 0.0–0.1)
Basophils Relative: 0.9 % (ref 0.0–3.0)
Eosinophils Absolute: 0.2 10*3/uL (ref 0.0–0.7)
Eosinophils Relative: 3.9 % (ref 0.0–5.0)
HCT: 39 % (ref 39.0–52.0)
Hemoglobin: 14 g/dL (ref 13.0–17.0)
Lymphocytes Relative: 26.2 % (ref 12.0–46.0)
Lymphs Abs: 1.5 10*3/uL (ref 0.7–4.0)
MCHC: 35.8 g/dL (ref 30.0–36.0)
MCV: 90.7 fl (ref 78.0–100.0)
Monocytes Absolute: 0.6 10*3/uL (ref 0.1–1.0)
Monocytes Relative: 9.9 % (ref 3.0–12.0)
Neutro Abs: 3.4 10*3/uL (ref 1.4–7.7)
Neutrophils Relative %: 59.1 % (ref 43.0–77.0)
Platelets: 100 10*3/uL — ABNORMAL LOW (ref 150.0–400.0)
RBC: 4.3 Mil/uL (ref 4.22–5.81)
RDW: 13.1 % (ref 11.5–15.5)
WBC: 5.8 10*3/uL (ref 4.0–10.5)

## 2019-02-04 LAB — LIPID PANEL
Cholesterol: 105 mg/dL (ref 0–200)
HDL: 32.7 mg/dL — ABNORMAL LOW (ref >39.00)
LDL Cholesterol: 57 mg/dL (ref 0–99)
NonHDL: 72.16
Total CHOL/HDL Ratio: 3
Triglycerides: 78 mg/dL (ref 0.0–149.0)
VLDL: 15.6 mg/dL (ref 0.0–40.0)

## 2019-02-04 LAB — HEMOGLOBIN A1C: Hgb A1c MFr Bld: 7.7 % — ABNORMAL HIGH (ref 4.6–6.5)

## 2019-02-08 NOTE — Telephone Encounter (Signed)
LMTCB. I stated in message that I could see where he was scheduled with Dr. Gabriel Carina but I don't know why he never got B-12 injections. I asked that he call back so I could be clear on what was going on.

## 2019-02-12 ENCOUNTER — Other Ambulatory Visit: Payer: Self-pay | Admitting: Cardiovascular Disease

## 2019-02-12 DIAGNOSIS — C44319 Basal cell carcinoma of skin of other parts of face: Secondary | ICD-10-CM | POA: Diagnosis not present

## 2019-02-12 NOTE — Telephone Encounter (Signed)
Please schedule F/U appointment with Dr. Gollan. Thank you! 

## 2019-02-14 ENCOUNTER — Ambulatory Visit (INDEPENDENT_AMBULATORY_CARE_PROVIDER_SITE_OTHER): Payer: Medicare Other

## 2019-02-14 ENCOUNTER — Other Ambulatory Visit: Payer: Self-pay

## 2019-02-14 DIAGNOSIS — E538 Deficiency of other specified B group vitamins: Secondary | ICD-10-CM

## 2019-02-14 MED ORDER — CYANOCOBALAMIN 1000 MCG/ML IJ SOLN
1000.0000 ug | Freq: Once | INTRAMUSCULAR | Status: AC
  Administered 2019-02-14: 1000 ug via INTRAMUSCULAR

## 2019-02-14 NOTE — Progress Notes (Signed)
Pt presents today for b12 injection. Right deltoid, IM. Pt tolerated well.

## 2019-02-14 NOTE — Progress Notes (Signed)
I have reviewed the above note and agree.  Kelda Azad, M.D.  

## 2019-02-17 ENCOUNTER — Other Ambulatory Visit: Payer: Self-pay | Admitting: Family Medicine

## 2019-02-17 DIAGNOSIS — D696 Thrombocytopenia, unspecified: Secondary | ICD-10-CM

## 2019-02-21 ENCOUNTER — Encounter: Payer: Self-pay | Admitting: Oncology

## 2019-02-21 ENCOUNTER — Other Ambulatory Visit: Payer: Self-pay

## 2019-02-21 NOTE — Progress Notes (Signed)
Patient stated that he had been doing well with no concerns. 

## 2019-02-22 ENCOUNTER — Inpatient Hospital Stay: Payer: Medicare Other | Attending: Oncology | Admitting: Oncology

## 2019-02-22 DIAGNOSIS — D696 Thrombocytopenia, unspecified: Secondary | ICD-10-CM | POA: Diagnosis not present

## 2019-02-22 NOTE — Progress Notes (Signed)
I connected with [name] [name] on 02/22/19 at  9:00 AM EST by video enabled telemedicine visit and verified that I am speaking with the correct person using two identifiers.   I discussed the limitations, risks, security and privacy concerns of performing an evaluation and management service by telemedicine and the availability of in-person appointments. I also discussed with the patient that there may be a patient responsible charge related to this service. The patient expressed understanding and agreed to proceed.  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  He was recently seen by Dr. Bonna Gains from GI for evaluation of abnormal LFTs.  He has been referred to [place] for thrombocytopenia.  Looking back at his CBCs patient has had mild chronic thrombocytopenia with a platelet count fluctuates between 90s to 110s since 2013.  In March 2013 his platelet count was 122.  White count and hemoglobin have always been normal.  Recent CBC from 08/31/2017 showed white count of 5.9, H&H of 14.4/30.1 and a platelet count of 105.  Patient reports feeling well.  He denies any complaints today.  Denies any symptoms of bleeding in his urine or stool.  He does report some easy bruising especially over his forearms.  He is not on any blood thinners.  Interval history: he is doing well. Denies any bleeding or bruising.    Review of Systems  Constitutional: Negative for chills, fever, malaise/fatigue and weight loss.  HENT: Negative for congestion, ear discharge and nosebleeds.   Eyes: Negative for blurred vision.  Respiratory: Negative for cough, hemoptysis, sputum production,  shortness of breath and wheezing.   Cardiovascular: Negative for chest pain, palpitations, orthopnea and claudication.  Gastrointestinal: Negative for abdominal pain, blood in stool, constipation, diarrhea, heartburn, melena, nausea and vomiting.  Genitourinary: Negative for dysuria, flank pain, frequency, hematuria and urgency.  Musculoskeletal: Negative for back pain, joint pain and myalgias.  Skin: Negative for rash.  Neurological: Negative for dizziness, tingling, focal weakness, seizures, weakness and headaches.  Endo/Heme/Allergies: Does not bruise/bleed easily.  Psychiatric/Behavioral: Negative for depression and suicidal ideas. The patient does not have insomnia.     Allergies  Allergen Reactions  . Cortisone Other (See Comments)    Reaction: Leg Cramps  . Metformin Diarrhea  . Metformin And Related Diarrhea    Past Medical History:  Diagnosis Date  . BPH (benign prostatic hyperplasia)   . Cancer ([place])    skin  . Cataract   . Coronary artery disease    a. 4v CABG 01/2005; b. cath 06/05/13 showed patent grafts: LIMA-LAD, VG-D, VG-OM3, VG-dRCA, nl filling pressures, nl LV fxn  . Diastolic dysfunction    a. echo 08/3014: nl systolic fxn, mild LVH, diastolic relaxation abnormality, mildly enlarged LA, mild Ao insufficiency  . Dyspnea    with exertion  . ED (erectile dysfunction)   . GERD (gastroesophageal reflux disease)   . Hyperlipidemia   . Hypertension   . IDDM (insulin dependent diabetes mellitus) 2000  . Osteoarthritis   . Perirectal fistula   . Pneumonia 12/2016  . Tubular adenoma of colon 07/2012  . Vertigo     Past Surgical History:  Procedure Laterality Date  . BACK SURGERY    .  CARDIAC CATHETERIZATION  06/05/2013   [facility]   Marland Kitchen CARDIAC CATHETERIZATION N/A 09/24/2015   Procedure: Right Heart Cath and Coronary/Graft Angiography;  Surgeon: Minna Merritts, MD;  Location: [place] [facility];  Service: Cardiovascular;  Laterality: N/A;  . CATARACT EXTRACTION   sept 2013   right  . CATARACT EXTRACTION W/PHACO Left 03/28/2017   Procedure: CATARACT EXTRACTION PHACO AND INTRAOCULAR LENS PLACEMENT (IOC);  Surgeon: Birder Robson, MD;  Location: [facility];  Service: Ophthalmology;  Laterality: Left;  [place] 00:42.0 AP% 15.0 CDE 6.31 Fluid Pack lot # [id number]  . CHOLECYSTECTOMY  05/15/2011   Procedure: LAPAROSCOPIC CHOLECYSTECTOMY;  Surgeon: Rolm Bookbinder, MD;  Location: [place];  Service: General;  Laterality: N/A;  . COLONOSCOPY W/ POLYPECTOMY    . CORONARY ARTERY BYPASS GRAFT  2007   x 4  . EYE SURGERY    . FOOT SURGERY  2012   right foot  . JOINT REPLACEMENT  8/10   Right THR--[name]  . LEFT AND RIGHT HEART CATHETERIZATION WITH CORONARY/GRAFT ANGIOGRAM N/A 06/05/2013   Procedure: LEFT AND RIGHT HEART CATHETERIZATION WITH Beatrix Fetters;  Surgeon: Peter M Martinique, MD;  Location: [facility];  Service: Cardiovascular;  Laterality: N/A;  . LUMBAR LAMINECTOMY  1989  . Prostate photovaporization  5/16   Dr Budd Palmer  . RIGHT/LEFT HEART CATH AND CORONARY/GRAFT ANGIOGRAPHY N/A 01/05/2018   Procedure: RIGHT/LEFT HEART CATH AND CORONARY/GRAFT ANGIOGRAPHY;  Surgeon: Minna Merritts, MD;  Location: [place] [facility];  Service: Cardiovascular;  Laterality: N/A;    Social History   Socioeconomic History  . Marital status: Married    Spouse name: Not on file  . Number of children: 4  . Years of education: Not on file  . Highest education level: Not on file  Occupational History  . 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 Smoking status: Never Smoker  . Smokeless tobacco: Never Used  Substance and Sexual Activity  . Alcohol use: Yes    Comment: occ cocktail  . Drug use: No  . Sexual activity: Not on file  Other Topics Concern  . Not on file  Social History Narrative   [name]   Has living will    Has designated [name] [name] as health care POA   Would accept  resuscitation attempts   Would not want tube feeds if cognitively unaware   Social Determinants of Health   Financial Resource Strain:   . Difficulty of Paying Living Expenses: Not on file  Food Insecurity:   . Worried About Charity fundraiser in the Last Year: Not on file  . Ran Out of Food in the Last Year: Not on file  Transportation Needs:   . Lack of Transportation (Medical): Not on file  . Lack of Transportation (Non-Medical): Not on file  Physical Activity:   . Days of Exercise per Week: Not on file  . Minutes of Exercise per Session: Not on file  Stress:   . Feeling of Stress : Not on file  Social Connections:   . Frequency of Communication with Friends and Family: Not on file  . Frequency of Social Gatherings with Friends and Family: Not on file  . Attends Religious Services: Not on file  . Active Member of Clubs or Organizations: Not on file  . Attends Archivist Meetings: Not on file  . Marital Status: Not on file  Intimate Partner Violence:   . Fear of Current  or Ex-Partner: Not on file  . Emotionally Abused: Not on file  . Physically Abused: Not on file  . Sexually Abused: Not on file    Family History  Problem Relation Age of Onset  . Lung cancer Mother   . Heart disease Father   . Colon cancer Neg Hx   . Breast cancer Neg Hx      Current Outpatient Medications:  .  acetaminophen (TYLENOL) 500 MG tablet, Take 1,000 mg by mouth 2 (two) times daily., Disp: , Rfl:  .  amLODipine (NORVASC) 5 MG tablet, TAKE ONE TABLET BY MOUTH TWICE DAILY, Disp: 90 tablet, Rfl: 3 .  aspirin EC 81 MG tablet, Take 81 mg by mouth daily., Disp: , Rfl:  .  BD INSULIN SYRINGE ULTRAFINE 31G X 15/64" 0.3 ML MISC, USE AS DIRECTED, Disp: 100 each, Rfl: 3 .  blood glucose meter kit and supplies KIT, OneTouch Verio w/ Device Kit OneTouch Verio VI STRP OneTouch Delica Lancet Dev misc OneTouch Delica Lancets 16X or Fine  Please check CBGs fasting and 2 other times during the day   Dx Code E11.8, Disp: 1 each, Rfl: 0 .  carvedilol (COREG) 12.5 MG tablet, TAKE 1 TABLET BY MOUTH TWICE DAILY (Patient taking differently: Take 12.5 mg by mouth 2 (two) times a day. ), Disp: 180 tablet, Rfl: 4 .  finasteride (PROSCAR) 5 MG tablet, Take 1 tablet (5 mg total) by mouth daily. (Patient taking differently: Take 5 mg by mouth at bedtime. ), Disp: 90 tablet, Rfl: 1 .  Insulin Pen Needle (BD PEN NEEDLE NANO U/F) 32G X 4 MM MISC, USE AS DIRECTED, Disp: 100 each, Rfl: 11 .  omeprazole (PRILOSEC) 20 MG capsule, TAKE 1 CAPSULE BY MOUTH TWICE DAILY, Disp: 60 capsule, Rfl: 2 .  potassium chloride (K-DUR) 10 MEQ tablet, Take 1 tablet (10 meq) by mouth twice daily 6 days a week (Patient taking differently: Take 10 mEq by mouth 2 (two) times daily. ), Disp: , Rfl:  .  rosuvastatin (CRESTOR) 20 MG tablet, TAKE ONE TABLET EVERY DAY, Disp: 90 tablet, Rfl: 0 .  TOUJEO SOLOSTAR 300 UNIT/ML SOPN, INJECT 50-60 UNITS INTO THE SKIN DAILY .INCREASE BY 1 UNITS/DAY UNTILFBS ARE BETWEEN 80-130. DO NOT EXCEED 60 UNITS PER DAY. (Patient taking differently: Inject 46-48 Units into the skin daily. ), Disp: 4.5 mL, Rfl: 6 .  Vitamin D, Cholecalciferol, 1000 units TABS, Take 1,000 Units by mouth daily. , Disp: , Rfl:  .  zolpidem (AMBIEN) 5 MG tablet, Take 1 tablet (5 mg total) by mouth at bedtime as needed for sleep. Stop trazadone not helpful, Disp: 30 tablet, Rfl: 0 .  cyclobenzaprine (FLEXERIL) 10 MG tablet, , Disp: , Rfl:  .  HYDROcodone-acetaminophen (NORCO/VICODIN) 5-325 MG tablet, , Disp: , Rfl:  .  ondansetron (ZOFRAN) 4 MG tablet, Take 1 tablet (4 mg total) by mouth every 8 (eight) hours as needed for nausea or vomiting. (Patient not taking: Reported on 02/21/2019), Disp: 10 tablet, Rfl: 0  No results found.  No images are attached to the encounter.   CMP Latest Ref Rng & Units 12/19/2018  Glucose 70 - 99 mg/dL 235(H)  BUN 6 - 23 mg/dL 11  Creatinine 0.40 - 1.50 mg/dL 1.12  Sodium 135 - 145 mEq/L 139   Potassium 3.5 - 5.1 mEq/L 3.8  Chloride 96 - 112 mEq/L 106  CO2 19 - 32 mEq/L 25  Calcium 8.4 - 10.5 mg/dL 8.7  Total Protein 6.0 - 8.3 g/dL 5.7(L)  Total Bilirubin 0.2 - 1.2 mg/dL 0.9  Alkaline Phos 39 - 117 U/L 117  AST 0 - 37 U/L 13  ALT 0 - 53 U/L 10   CBC Latest Ref Rng & Units 02/04/2019  WBC 4.0 - 10.5 K/uL 5.8  Hemoglobin 13.0 - 17.0 g/dL 14.0  Hematocrit 39.0 - 52.0 % 39.0  Platelets 150.0 - 400.0 K/uL 100.0(L)     Observation/objective: appears in no acute distress over video visit today. Breathing is non labored.   Assessment and plan: Patient is a 85 yr old male with thrombocytopenia for routine f/u  Patient has mild isolated thrombocytopenia at least for the last 3 years and his platelet counts have been waiting between 80s to 110s.  Today his white blood count is 100.  White cell count and hemoglobin are within normal limits.  Follow-up instructions: Given the stability of his counts I will repeat a CBC with differential in 1 years time  I discussed the assessment and treatment plan with the patient. The patient was provided an opportunity to ask questions and all were answered. The patient agreed with the plan and demonstrated an understanding of the instructions.   The patient was advised to call back or seek an in-person evaluation if the symptoms worsen or if the condition fails to improve as anticipated.   Visit Diagnosis: 1. Thrombocytopenia ([place])     Dr. Randa Evens, MD, MPH [facility] at [facility] [phone] 02/22/2019 9:02 AM

## 2019-02-26 ENCOUNTER — Telehealth: Payer: Self-pay

## 2019-02-26 DIAGNOSIS — G4700 Insomnia, unspecified: Secondary | ICD-10-CM

## 2019-02-26 MED ORDER — ZOLPIDEM TARTRATE 5 MG PO TABS: 5.0000 mg | ORAL_TABLET | Freq: Every evening | ORAL | 0 refills | Status: DC | PRN

## 2019-02-26 NOTE — Telephone Encounter (Signed)
Please call the patient.  This medicine was previously prescribed by Dr. Terese Door.  I am happy to continue this if it has been beneficial though I wanted to confirm whether or not this has been helpful for the patient.  Please see if it makes him drowsy the next day.  Please make sure he is not drinking any alcohol with this medication.

## 2019-02-26 NOTE — Telephone Encounter (Signed)
Patient notified refill was sent.

## 2019-02-26 NOTE — Telephone Encounter (Signed)
Sent to pharmacy 

## 2019-02-26 NOTE — Telephone Encounter (Signed)
I called and spoke with patient. He stated that he does not take on a regular basis maybe 3 nights a week. He said that he takes 0.5-1 tablet if he can't get to sleep a couple hours after laying down. He said that trazodone does not help & he has tried taking two of those at a time but does not seem to work. He does not drink alcohol on a regular basis. He said he may have one drink per week but even that isn't consistent.

## 2019-03-14 ENCOUNTER — Other Ambulatory Visit: Payer: Self-pay

## 2019-03-15 DIAGNOSIS — E1159 Type 2 diabetes mellitus with other circulatory complications: Secondary | ICD-10-CM | POA: Diagnosis not present

## 2019-03-15 DIAGNOSIS — R809 Proteinuria, unspecified: Secondary | ICD-10-CM | POA: Diagnosis not present

## 2019-03-15 DIAGNOSIS — E1129 Type 2 diabetes mellitus with other diabetic kidney complication: Secondary | ICD-10-CM | POA: Diagnosis not present

## 2019-03-15 DIAGNOSIS — I2510 Atherosclerotic heart disease of native coronary artery without angina pectoris: Secondary | ICD-10-CM | POA: Diagnosis not present

## 2019-03-15 DIAGNOSIS — E1121 Type 2 diabetes mellitus with diabetic nephropathy: Secondary | ICD-10-CM | POA: Diagnosis not present

## 2019-03-15 NOTE — Progress Notes (Signed)
I connected with  [name] [name] on 03/19/19   Evaluation Performed:  Follow-up visit  Date:  03/19/2019   ID:  [name] [name], DOB [birth year], MRN [id number]  Patient Location:  [address] [address]   Provider location:   [facility], [place] office  PCP:  [name], MD  Cardiologist:  Arvid Right [facility]   Chief Complaint  Patient presents with  . office visit    3 month F/U; Meds verbally reviewed with patient.    History of Present Illness:    [name] [name] is a 86 y.o. male  past medical history of coronary artery disease, bypass surgery in November 2006, Stress test March 2017, cath 09/2015, medical management Occluded proximal LAD  Critical/severe native RCA disease  Vein graft to the diagonal is occluded  Other vein grafts patent to the RCA/PDA, vein graft to the OM 3, LIMA graft  EF 55% in 12/2013 diabetes, numbers running high obesity,  hyperlipidemia,  hypertension,  erectile dysfunction Chronic SOB, nonsmoker Postherpetic neuralgia left flank Stable angina, chronic diastolic CHF, chronic shortness of breath Pulmonary hypertension on right heart catheterization who presents for routine followup of his coronary artery disease, and hypertension  Wife presents with him today  Last cath 01/2018 Details discussed with him Graft to the right patent, native left circumflex patent, LIMA graft to the LAD patent.  Native RCA is occluded, vein graft to left circumflex occluded, vein graft to the diagonal occluded --Moderate pulmonary hypertension by right heart pressures  Stopped heart track for 4 sessions complete secondary to Covid and a back problem Back has recovered  Chronic SOB Has been sedentary, no regular exercise program Weight is up 10 pounds, poor diet  On ozempic, followed by endocrine  takes lasix daily and potassium Not on his medication list today  EKG personally reviewed by  myself on todays visit Shows normal sinus rhythm with rate 65 bpm T wave abnormality inferior leads  Other past medical history reviewed  Prior PFT mild to moderate obstruction  History of Bell's palsy, Left side of face March 2019  2 days after doing cataract surgery Completed prednisone and antivirals Improved sx, still difficulty smoking, right facial weakness  Seen in the past by specialist for his back in [place], had MRI showing arthritis no spinal stenosis  CT scan of his abdomen reviewed showing mild diffuse aortic atherosclerosis  Echocardiogram April 2009 shows normal systolic function with mild LVH, diastolic relaxation abnormality, mildly enlarged left atrium, mild aortic insufficiency.  Cardiac catheterization January 05, 2018  Echocardiogram January 08, 2018 Left ventricle: The cavity size was normal. There was mild concentric hypertrophy. Systolic function was normal. The estimated ejection fraction was in the range of 55% to 60%. Wall motion was normal; there were no regional wall motion abnormalities. Features are consistent with a pseudonormal left ventricular filling pattern, with concomitant abnormal relaxation and increased filling pressure (grade 2 diastolic dysfunction). - Aortic valve: There was trivial regurgitation. - Mitral valve: Calcified annulus. - Left atrium: The atrium was mildly dilated. - Pulmonary arteries: Systolic pressure was mildly increased.  Past Medical History:  Diagnosis Date  . BPH (benign prostatic hyperplasia)   . Cancer ([place])    skin  . Cataract   . Coronary artery disease    a. 4v CABG 01/2005; b. cath 06/05/13 showed patent grafts: LIMA-LAD, VG-D, VG-OM3, VG-dRCA, nl filling pressures, nl LV fxn  . Diastolic dysfunction  a. echo 06/6801: nl systolic fxn, mild LVH, diastolic relaxation abnormality, mildly enlarged LA, mild Ao insufficiency  . Dyspnea    with exertion  . ED (erectile dysfunction)   . GERD  (gastroesophageal reflux disease)   . Hyperlipidemia   . Hypertension   . IDDM (insulin dependent diabetes mellitus) 2000  . Osteoarthritis   . Perirectal fistula   . Pneumonia 12/2016  . Tubular adenoma of colon 07/2012  . Vertigo    Past Surgical History:  Procedure Laterality Date  . BACK SURGERY    . CARDIAC CATHETERIZATION  06/05/2013   [facility]   Marland Kitchen CARDIAC CATHETERIZATION N/A 09/24/2015   Procedure: Right Heart Cath and Coronary/Graft Angiography;  Surgeon: Minna Merritts, MD;  Location: [place] [facility];  Service: Cardiovascular;  Laterality: N/A;  . CATARACT EXTRACTION  sept 2013   right  . CATARACT EXTRACTION W/PHACO Left 03/28/2017   Procedure: CATARACT EXTRACTION PHACO AND INTRAOCULAR LENS PLACEMENT (IOC);  Surgeon: Birder Robson, MD;  Location: [facility];  Service: Ophthalmology;  Laterality: Left;  [place] 00:42.0 AP% 15.0 CDE 6.31 Fluid Pack lot # [id number]  . CHOLECYSTECTOMY  05/15/2011   Procedure: LAPAROSCOPIC CHOLECYSTECTOMY;  Surgeon: Rolm Bookbinder, MD;  Location: [place];  Service: General;  Laterality: N/A;  . COLONOSCOPY W/ POLYPECTOMY    . CORONARY ARTERY BYPASS GRAFT  2007   x 4  . EYE SURGERY    . FOOT SURGERY  2012   right foot  . JOINT REPLACEMENT  8/10   Right THR--[name]  . LEFT AND RIGHT HEART CATHETERIZATION WITH CORONARY/GRAFT ANGIOGRAM N/A 06/05/2013   Procedure: LEFT AND RIGHT HEART CATHETERIZATION WITH Beatrix Fetters;  Surgeon: Peter M Martinique, MD;  Location: [facility] CATH LAB;  Service: Cardiovascular;  Laterality: N/A;  . LUMBAR LAMINECTOMY  1989  . Prostate photovaporization  5/16   Dr Budd Palmer  . RIGHT/LEFT HEART CATH AND CORONARY/GRAFT ANGIOGRAPHY N/A 01/05/2018   Procedure: RIGHT/LEFT HEART CATH AND CORONARY/GRAFT ANGIOGRAPHY;  Surgeon: Minna Merritts, MD;  Location: [place] [facility];  Service: Cardiovascular;  Laterality: N/A;     Current Meds  Medication Sig  . acetaminophen (TYLENOL) 500 MG tablet Take 1,000 mg by  mouth 2 (two) times daily.  Marland Kitchen amLODipine (NORVASC) 5 MG tablet TAKE ONE TABLET BY MOUTH TWICE DAILY  . aspirin EC 81 MG tablet Take 81 mg by mouth daily.  . BD INSULIN SYRINGE ULTRAFINE 31G X 15/64" 0.3 ML MISC USE AS DIRECTED  . blood glucose meter kit and supplies KIT OneTouch Verio w/ Device Kit OneTouch Verio VI STRP OneTouch Delica Lancet Dev misc OneTouch Delica Lancets 50I or Fine  Please check CBGs fasting and 2 other times during the day  Dx Code E11.8  . carvedilol (COREG) 12.5 MG tablet TAKE 1 TABLET BY MOUTH TWICE DAILY (Patient taking differently: Take 12.5 mg by mouth 2 (two) times a day. )  . cyclobenzaprine (FLEXERIL) 10 MG tablet   . finasteride (PROSCAR) 5 MG tablet Take 1 tablet (5 mg total) by mouth daily. (Patient taking differently: Take 5 mg by mouth at bedtime. )  . HYDROcodone-acetaminophen (NORCO/VICODIN) 5-325 MG tablet   . Insulin Pen Needle (BD PEN NEEDLE NANO U/F) 32G X 4 MM MISC USE AS DIRECTED  . omeprazole (PRILOSEC) 20 MG capsule TAKE 1 CAPSULE BY MOUTH TWICE DAILY  . ondansetron (ZOFRAN) 4 MG tablet Take 1 tablet (4 mg total) by mouth every 8 (eight) hours as needed for nausea or vomiting.  . potassium  chloride (K-DUR) 10 MEQ tablet Take 1 tablet (10 meq) by mouth twice daily 6 days a week (Patient taking differently: Take 10 mEq by mouth 2 (two) times daily. )  . rosuvastatin (CRESTOR) 20 MG tablet TAKE ONE TABLET EVERY DAY  . Semaglutide,0.25 or 0.'5MG'$ /DOS, (OZEMPIC, 0.25 OR 0.5 MG/DOSE,) 2 MG/1.5ML SOPN Inject 0.25 mg into the skin once a week.  Nelva Nay SOLOSTAR 300 UNIT/ML SOPN INJECT 50-60 UNITS INTO THE SKIN DAILY .INCREASE BY 1 UNITS/DAY UNTILFBS ARE BETWEEN 80-130. DO NOT EXCEED 60 UNITS PER DAY. (Patient taking differently: Inject 46-48 Units into the skin daily. )  . Vitamin D, Cholecalciferol, 1000 units TABS Take 1,000 Units by mouth daily.   Marland Kitchen zolpidem (AMBIEN) 5 MG tablet Take 1 tablet (5 mg total) by mouth at bedtime as needed for sleep. Stop  trazadone not helpful     Allergies:   Cortisone, Metformin, and Metformin and related   Social History   Tobacco Use  . Smoking status: Never Smoker  . Smokeless tobacco: Never Used  Substance Use Topics  . Alcohol use: Yes    Comment: occ cocktail  . Drug use: No     Family Hx: The patient's family history includes Heart disease in his father; Lung cancer in his mother. There is no history of Colon cancer or Breast cancer.  ROS:   Please see the history of present illness.    Review of Systems  Constitutional: Negative.   Respiratory: Positive for shortness of breath.   Cardiovascular: Positive for leg swelling.  Gastrointestinal: Negative.   Musculoskeletal: Negative.   Neurological: Negative.   Psychiatric/Behavioral: Negative.   All other systems reviewed and are negative.    Labs/Other Tests and Data Reviewed:    Recent Labs: 12/07/2018: TSH 0.99 12/19/2018: ALT 10; BUN 11; Creatinine, Ser 1.12; Potassium 3.8; Sodium 139 02/04/2019: Hemoglobin 14.0; Platelets 100.0   Recent Lipid Panel Lab Results  Component Value Date/Time   CHOL 105 02/04/2019 09:58 AM   CHOL 125 12/27/2017 02:46 PM   TRIG 78.0 02/04/2019 09:58 AM   TRIG 289 (H) 12/27/2017 02:46 PM   HDL 32.70 (L) 02/04/2019 09:58 AM   HDL 39 (L) 07/04/2014 08:01 AM   CHOLHDL 3 02/04/2019 09:58 AM   LDLCALC 57 02/04/2019 09:58 AM   LDLCALC 110 (H) 07/04/2014 08:01 AM   LDLDIRECT 65.0 06/02/2016 02:17 PM    Wt Readings from Last 3 Encounters:  03/19/19 216 lb 8 oz (98.2 kg)  01/07/19 212 lb 12.8 oz (96.5 kg)  11/27/18 204 lb (92.5 kg)     Exam:    BP 130/70 (BP Location: Left Arm, Patient Position: Sitting, Cuff Size: Normal)   Pulse 65   Ht 6' (1.829 m)   Wt 216 lb 8 oz (98.2 kg)   SpO2 97%   BMI 29.36 kg/m   Constitutional:  oriented to person, place, and time. No distress.  HENT:  Head: Grossly normal Eyes:  no discharge. No scleral icterus.  Neck: No JVD, no carotid bruits    Cardiovascular: Regular rate and rhythm, no murmurs appreciated Pulmonary/Chest: Clear to auscultation bilaterally, no wheezes or rails Abdominal: Soft.  no distension.  no tenderness.  Musculoskeletal: Normal range of motion Neurological:  normal muscle tone. Coordination normal. No atrophy Skin: Skin warm and dry Psychiatric: normal affect, pleasant   ASSESSMENT & PLAN:    Pulmonary hypertension Previous PFTs, obstructive disease,  Lasix refilled 20 daily Suspect most of his chronic shortness of breath is from obesity  and deconditioning --Sildenafil previously tried, has fallen off his list.  We will hold off on restarting for now as symptoms seem unchanged.  Very sedentary lifestyle, recommended weight loss and restarting exercise program  Coronary artery disease of native artery of native heart with stable angina pectoris [facility]) Prior catheterization discussed with him Working with endocrine for diabetes control Cholesterol at goal, non-smoker  Chronic diastolic CHF (congestive heart failure) (HCC) Continue Lasix 20 daily Moderate fluid intake Is not eating out as much  Type 2 diabetes mellitus with complication, without long-term current use of insulin ([place]) Recently started on Ozempic Recommend he follow-up with endocrine, notes indicate scheduled to increase the dose from 0.25 up to 0.5 after 2 weeks  Mixed hyperlipidemia At goal, no medication changes made  Essential hypertension Blood pressure is well controlled on today's visit. No changes made to the medications. Stable  Stable angina (HCC) Continue current medications  S/P CABG x 4 Prior catheterization in the 2019   CKD (chronic kidney disease) stage 3, GFR 30-59 ml/min (HCC) Continue Lasix daily GFR 62 in October 2020  Disposition: Follow-up in 12 months   Total encounter time more than 25 minutes  Greater than 50% was spent in counseling and coordination of care with the  patient    Signed, Ida Rogue, MD  03/19/2019 4:49 PM    [place] Office [address], [place], [place] [address]

## 2019-03-19 ENCOUNTER — Ambulatory Visit: Payer: Medicare Other

## 2019-03-19 ENCOUNTER — Ambulatory Visit (INDEPENDENT_AMBULATORY_CARE_PROVIDER_SITE_OTHER): Payer: Medicare Other | Admitting: Cardiovascular Disease

## 2019-03-19 ENCOUNTER — Other Ambulatory Visit: Payer: Self-pay

## 2019-03-19 ENCOUNTER — Encounter: Payer: Self-pay | Admitting: Cardiovascular Disease

## 2019-03-19 VITALS — BP 130/70 | HR 65 | Ht 72.0 in | Wt 216.5 lb

## 2019-03-19 DIAGNOSIS — I25118 Atherosclerotic heart disease of native coronary artery with other forms of angina pectoris: Secondary | ICD-10-CM

## 2019-03-19 DIAGNOSIS — I10 Essential (primary) hypertension: Secondary | ICD-10-CM

## 2019-03-19 DIAGNOSIS — I5032 Chronic diastolic (congestive) heart failure: Secondary | ICD-10-CM | POA: Diagnosis not present

## 2019-03-19 DIAGNOSIS — E782 Mixed hyperlipidemia: Secondary | ICD-10-CM

## 2019-03-19 DIAGNOSIS — E118 Type 2 diabetes mellitus with unspecified complications: Secondary | ICD-10-CM

## 2019-03-19 DIAGNOSIS — R0602 Shortness of breath: Secondary | ICD-10-CM | POA: Diagnosis not present

## 2019-03-19 DIAGNOSIS — Z951 Presence of aortocoronary bypass graft: Secondary | ICD-10-CM

## 2019-03-19 DIAGNOSIS — N1831 Chronic kidney disease, stage 3a: Secondary | ICD-10-CM

## 2019-03-19 MED ORDER — POTASSIUM CHLORIDE CRYS ER 20 MEQ PO TBCR: 20.0000 meq | EXTENDED_RELEASE_TABLET | Freq: Every day | ORAL | 3 refills | Status: DC

## 2019-03-19 MED ORDER — FUROSEMIDE 20 MG PO TABS: 20.0000 mg | ORAL_TABLET | Freq: Every day | ORAL | 3 refills | Status: DC

## 2019-03-19 NOTE — Patient Instructions (Addendum)
Medication Instructions:  Stay on Lasix with potassium daily  If you need a refill on your cardiac medications before your next appointment, please call your pharmacy.    Lab work: No new labs needed   If you have labs (blood work) drawn today and your tests are completely normal, you will receive your results only by: Marland Kitchen MyChart Message (if you have MyChart) OR . A paper copy in the mail If you have any lab test that is abnormal or we need to change your treatment, we will call you to review the results.   Testing/Procedures: No new testing needed   Follow-Up: At [facility], you and your health needs are our priority.  As part of our continuing mission to provide you with exceptional heart care, we have created designated Provider Care Teams.  These Care Teams include your primary Cardiologist (physician) and Advanced Practice Providers (APPs -  Physician Assistants and Nurse Practitioners) who all work together to provide you with the care you need, when you need it.  . You will need a follow up appointment in 12 months .  Marland Kitchen Providers on your designated Care Team:   . [name], NP . Christell Faith, PA-C . Marrianne Mood, PA-C  Any Other Special Instructions Will Be Listed Below (If Applicable).  For educational health videos Log in to : [email or web address] Or : [email or web address], password : [email or web address]

## 2019-03-25 ENCOUNTER — Other Ambulatory Visit: Payer: Self-pay | Admitting: Cardiovascular Disease

## 2019-03-28 ENCOUNTER — Ambulatory Visit: Payer: Medicare Other | Attending: Internal Medicine

## 2019-03-28 DIAGNOSIS — Z23 Encounter for immunization: Secondary | ICD-10-CM | POA: Insufficient documentation

## 2019-03-28 NOTE — Progress Notes (Signed)
   Covid-19 Vaccination Clinic  Name:  [name] [name]    MRN: [id number] DOB: [birth year]  03/28/2019  [name] [name] was observed post Covid-19 immunization for 15 minutes without incidence. He was provided with Vaccine Information Sheet and instruction to access the V-Safe system.   [name] [name] was instructed to call 911 with any severe reactions post vaccine: Marland Kitchen Difficulty breathing  . Swelling of your face and throat  . A fast heartbeat  . A bad rash all over your body  . Dizziness and weakness    Immunizations Administered    Name Date Dose VIS Date Route   Pfizer COVID-19 Vaccine 03/28/2019  2:09 PM 0.3 mL 02/15/2019 Intramuscular   Manufacturer: [place]   Lot: [id number]   [place]: [id number]

## 2019-03-29 ENCOUNTER — Other Ambulatory Visit: Payer: Self-pay | Admitting: Cardiovascular Disease

## 2019-04-05 ENCOUNTER — Other Ambulatory Visit: Payer: Self-pay | Admitting: Cardiovascular Disease

## 2019-04-05 DIAGNOSIS — I272 Pulmonary hypertension, unspecified: Secondary | ICD-10-CM | POA: Diagnosis not present

## 2019-04-05 DIAGNOSIS — E1129 Type 2 diabetes mellitus with other diabetic kidney complication: Secondary | ICD-10-CM | POA: Diagnosis not present

## 2019-04-05 DIAGNOSIS — I10 Essential (primary) hypertension: Secondary | ICD-10-CM | POA: Diagnosis not present

## 2019-04-05 DIAGNOSIS — R809 Proteinuria, unspecified: Secondary | ICD-10-CM | POA: Diagnosis not present

## 2019-04-05 DIAGNOSIS — N182 Chronic kidney disease, stage 2 (mild): Secondary | ICD-10-CM | POA: Diagnosis not present

## 2019-04-05 NOTE — Telephone Encounter (Signed)
Pt is taking Ramipril 10 mg bid. Medication isn't on pt's current med list or last ov note. Please advise if ok to refill medication.  Pt mentioned that he is taking Potassium 20 meq qd. Pt mentioned that he cuts his potassium in half when he takes his fluid pill because he has trouble swallowing the potassium pill. Pt is requesting to go back to his Potassium 10 meq qd.  Please advise new Rx request.

## 2019-04-08 ENCOUNTER — Telehealth: Payer: Self-pay

## 2019-04-08 ENCOUNTER — Other Ambulatory Visit: Payer: Self-pay | Admitting: Cardiovascular Disease

## 2019-04-08 MED ORDER — POTASSIUM CHLORIDE ER 10 MEQ PO TBCR: 10.0000 meq | EXTENDED_RELEASE_TABLET | Freq: Two times a day (BID) | ORAL | Status: DC

## 2019-04-08 NOTE — Telephone Encounter (Signed)
See refill. Duplicate.

## 2019-04-09 ENCOUNTER — Telehealth: Payer: Self-pay | Admitting: Cardiovascular Disease

## 2019-04-09 MED ORDER — POTASSIUM CHLORIDE ER 10 MEQ PO TBCR: 20.0000 meq | EXTENDED_RELEASE_TABLET | Freq: Once | ORAL | 3 refills | Status: DC

## 2019-04-09 MED ORDER — RAMIPRIL 10 MG PO CAPS: 10.0000 mg | ORAL_CAPSULE | Freq: Two times a day (BID) | ORAL | 4 refills | Status: DC

## 2019-04-09 NOTE — Telephone Encounter (Signed)
Spoke with patient and reviewed that would be fine. He then also stated that he needed refill on his ramipril which he takes twice a day. It was discontinued when he went to ED back in July 2020. He states that he has continued taking this and is about to run out. Sent in refill and advised I would discontinue the KCL 20 mEq in his chart as well. Will make provider aware that he is on this medication and change for KCL due to size of pills. He was appreciative for the call with no further questions at this time.

## 2019-04-09 NOTE — Telephone Encounter (Signed)
*  STAT* If patient is at the pharmacy, call can be transferred to refill team.   1. Which medications need to be refilled? (please list name of each medication and dose if known) Ramipril 10 Mg 1 capsule daily   2. Which pharmacy/location (including street and city if local pharmacy) is medication to be sent to? [facility]   3. Do they need a 30 day or 90 day supply? 90 day

## 2019-04-09 NOTE — Telephone Encounter (Signed)
Duplicate. Refill sent in and MD for review.

## 2019-04-09 NOTE — Telephone Encounter (Signed)
Pt c/o medication issue:  1. Name of Medication: potassium chloride   2. How are you currently taking this medication (dosage and times per day)? 20 MEQ 1 daily   3. Are you having a reaction (difficulty breathing--STAT)? no  4. What is your medication issue? Patient was recently switched to potassium 20 MEQ, the pills are much too large for patient to swallow, would like to know if he can go back to 10 MEQ and just double up, they are smaller.  Please call to discuss.

## 2019-04-09 NOTE — Telephone Encounter (Signed)
Please advise if OK to refill. Not on current med list. Patient states he is taking this.  Discontinued by: Demetrios Loll, MD on 09/08/2018 14:59  Reason: Stop Taking at Discharge

## 2019-04-15 ENCOUNTER — Other Ambulatory Visit: Payer: Self-pay | Admitting: Family Medicine

## 2019-04-18 ENCOUNTER — Ambulatory Visit: Payer: Medicare Other | Attending: Internal Medicine

## 2019-04-18 DIAGNOSIS — Z23 Encounter for immunization: Secondary | ICD-10-CM | POA: Insufficient documentation

## 2019-04-18 NOTE — Telephone Encounter (Signed)
This has not been prescribed since September. Please see what he needs this for. Thanks.

## 2019-04-18 NOTE — Progress Notes (Signed)
   Covid-19 Vaccination Clinic  Name:  [name] [name]    MRN: [id number] DOB: 08/03/1933  04/18/2019  [name] was observed post Covid-19 immunization for 15 minutes without incidence. He was provided with Vaccine Information Sheet and instruction to access the V-Safe system.   [name] was instructed to call 911 with any severe reactions post vaccine: Marland Kitchen Difficulty breathing  . Swelling of your face and throat  . A fast heartbeat  . A bad rash all over your body  . Dizziness and weakness    Immunizations Administered    Name Date Dose VIS Date Route   Pfizer COVID-19 Vaccine 04/18/2019  3:33 PM 0.3 mL 02/15/2019 Intramuscular   Manufacturer: [place]   Lot: [id number]   [place]: [id number]

## 2019-04-18 NOTE — Telephone Encounter (Signed)
Patient stated that he does still take sparingly. He has a lot of back pain & arthritis he said especially when the weather is like it is today.

## 2019-05-01 DIAGNOSIS — S82235A Nondisplaced oblique fracture of shaft of left tibia, initial encounter for closed fracture: Secondary | ICD-10-CM | POA: Diagnosis not present

## 2019-05-01 DIAGNOSIS — S8992XA Unspecified injury of left lower leg, initial encounter: Secondary | ICD-10-CM | POA: Diagnosis not present

## 2019-05-01 DIAGNOSIS — W108XXA Fall (on) (from) other stairs and steps, initial encounter: Secondary | ICD-10-CM | POA: Diagnosis not present

## 2019-05-01 DIAGNOSIS — S82435A Nondisplaced oblique fracture of shaft of left fibula, initial encounter for closed fracture: Secondary | ICD-10-CM | POA: Diagnosis not present

## 2019-05-01 DIAGNOSIS — S82832A Other fracture of upper and lower end of left fibula, initial encounter for closed fracture: Secondary | ICD-10-CM | POA: Diagnosis not present

## 2019-05-03 DIAGNOSIS — M25572 Pain in left ankle and joints of left foot: Secondary | ICD-10-CM | POA: Diagnosis not present

## 2019-05-03 DIAGNOSIS — M25472 Effusion, left ankle: Secondary | ICD-10-CM | POA: Diagnosis not present

## 2019-05-03 DIAGNOSIS — S82832A Other fracture of upper and lower end of left fibula, initial encounter for closed fracture: Secondary | ICD-10-CM | POA: Diagnosis not present

## 2019-05-07 ENCOUNTER — Other Ambulatory Visit: Payer: Self-pay

## 2019-05-07 ENCOUNTER — Encounter: Payer: Self-pay | Admitting: Family Medicine

## 2019-05-07 ENCOUNTER — Ambulatory Visit (INDEPENDENT_AMBULATORY_CARE_PROVIDER_SITE_OTHER): Payer: Medicare Other | Admitting: Family Medicine

## 2019-05-07 VITALS — BP 130/80 | HR 77 | Temp 96.3°F | Ht 73.0 in | Wt 211.6 lb

## 2019-05-07 DIAGNOSIS — S82832A Other fracture of upper and lower end of left fibula, initial encounter for closed fracture: Secondary | ICD-10-CM | POA: Diagnosis not present

## 2019-05-07 DIAGNOSIS — E538 Deficiency of other specified B group vitamins: Secondary | ICD-10-CM

## 2019-05-07 DIAGNOSIS — B0229 Other postherpetic nervous system involvement: Secondary | ICD-10-CM | POA: Diagnosis not present

## 2019-05-07 DIAGNOSIS — R1013 Epigastric pain: Secondary | ICD-10-CM

## 2019-05-07 DIAGNOSIS — I10 Essential (primary) hypertension: Secondary | ICD-10-CM | POA: Diagnosis not present

## 2019-05-07 DIAGNOSIS — E118 Type 2 diabetes mellitus with unspecified complications: Secondary | ICD-10-CM

## 2019-05-07 LAB — HEPATIC FUNCTION PANEL
ALT: 20 U/L (ref 0–53)
AST: 20 U/L (ref 0–37)
Albumin: 3.7 g/dL (ref 3.5–5.2)
Alkaline Phosphatase: 183 U/L — ABNORMAL HIGH (ref 39–117)
Bilirubin, Direct: 0.2 mg/dL (ref 0.0–0.3)
Total Bilirubin: 0.8 mg/dL (ref 0.2–1.2)
Total Protein: 6.1 g/dL (ref 6.0–8.3)

## 2019-05-07 LAB — CBC
HCT: 36.6 % — ABNORMAL LOW (ref 39.0–52.0)
Hemoglobin: 13.1 g/dL (ref 13.0–17.0)
MCHC: 35.8 g/dL (ref 30.0–36.0)
MCV: 92.4 fl (ref 78.0–100.0)
Platelets: 133 10*3/uL — ABNORMAL LOW (ref 150.0–400.0)
RBC: 3.96 Mil/uL — ABNORMAL LOW (ref 4.22–5.81)
RDW: 13 % (ref 11.5–15.5)
WBC: 7.1 10*3/uL (ref 4.0–10.5)

## 2019-05-07 LAB — LIPASE: Lipase: 4 U/L — ABNORMAL LOW (ref 11.0–59.0)

## 2019-05-07 MED ORDER — CYANOCOBALAMIN 1000 MCG/ML IJ SOLN
1000.0000 ug | Freq: Once | INTRAMUSCULAR | Status: AC
  Administered 2019-05-07: 1000 ug via INTRAMUSCULAR

## 2019-05-07 MED ORDER — GABAPENTIN 100 MG PO CAPS: 100.0000 mg | ORAL_CAPSULE | Freq: Three times a day (TID) | ORAL | 3 refills | Status: DC

## 2019-05-07 MED ORDER — CEPHALEXIN 500 MG PO CAPS: 500.0000 mg | ORAL_CAPSULE | Freq: Four times a day (QID) | ORAL | 0 refills | Status: DC

## 2019-05-07 NOTE — Progress Notes (Signed)
[name], MD Phone: [phone]  [name] [name] is a 86 y.o. male who presents today for follow-up.  B12 deficiency: Patient notes he missed his last dose.  He needs a dose today.  Fibula fracture: Patient notes he missed the bottom step when walking down the stairs and developed swelling and pain over the left ankle.  He was found to have a left fibula fracture.  He notes the top of his foot has been stinging.  He has not been wearing the boot that was prescribed by orthopedics.  He has developed some redness over his lateral left ankle that does have some heat and may have been spreading.  Diabetes: Typically 100-140.  Taking Toujeo 47 units.  Also taking Ozempic.  No polyuria or polydipsia.  Postherpetic neuralgia: Patient has a history of shingles.  He notes some postherpetic neuralgia symptoms with itching and tingling.  Decreased appetite: Patient notes this has been going on recently.  He has some early satiety and epigastric abdominal discomfort.  He does not like the sight of food.  No depression or anxiety.  No vomiting or diarrhea.  Social History   Tobacco Use  Smoking Status Never Smoker  Smokeless Tobacco Never Used     ROS see history of present illness  Objective  Physical Exam Vitals:   05/07/19 1212  BP: 130/80  Pulse: 77  Temp: (!) 96.3 F (35.7 C)  SpO2: 97%    BP Readings from Last 3 Encounters:  05/07/19 130/80  03/19/19 130/70  01/07/19 130/60   Wt Readings from Last 3 Encounters:  05/07/19 211 lb 9.6 oz (96 kg)  03/19/19 216 lb 8 oz (98.2 kg)  01/07/19 212 lb 12.8 oz (96.5 kg)    Physical Exam Constitutional:      General: He is not in acute distress.    Appearance: He is not diaphoretic.  Cardiovascular:     Rate and Rhythm: Normal rate and regular rhythm.     Heart sounds: Normal heart sounds.  Pulmonary:     Effort: Pulmonary effort is normal.     Breath sounds: Normal breath sounds.  Abdominal:     General: Bowel  sounds are normal. There is no distension.     Palpations: Abdomen is soft.     Tenderness: There is abdominal tenderness (Mild epigastric tenderness). There is no guarding or rebound.  Skin:    General: Skin is warm and dry.  Neurological:     Mental Status: He is alert.    Erythema over left lateral ankle, there is tenderness and warmth    Assessment/Plan: Please see individual problem list.  Fibula fracture I encouraged the patient to wear the boot as prescribed by orthopedics.  He will continue to follow with orthopedics on this.  There is some erythema that potentially could be related to a cellulitis though could be from his injury.  Given the potential for cellulitis will cover with Keflex and recheck on Friday.  He will contact us sooner if it is worsening or he develops any systemic symptoms.  Epigastric pain Epigastric pain with decreased appetite and some early satiety.  We will get lab work and then consider GI referral.  Postherpetic neuralgia Symptoms consistent with postherpetic neuralgia.  We will trial gabapentin.  Discussed monitoring for drowsiness and if that occurs he should let us know right away.  Essential hypertension Not checking BP.  Continue current medication as it is adequately controlled in the office.  Type 2 diabetes mellitus with  complication, without long-term current use of insulin (HCC) Adequately controlled.  Continue current regimen.  B12 deficiency B12 given today.   Orders Placed This Encounter  Procedures  . Hepatic function panel  . CBC  . Lipase    Meds ordered this encounter  Medications  . gabapentin (NEURONTIN) 100 MG capsule    Sig: Take 1 capsule (100 mg total) by mouth 3 (three) times daily.    Dispense:  90 capsule    Refill:  3  . cephALEXin (KEFLEX) 500 MG capsule    Sig: Take 1 capsule (500 mg total) by mouth 4 (four) times daily.    Dispense:  28 capsule    Refill:  0  . cyanocobalamin ((VITAMIN B-12)) injection  1,000 mcg    This visit occurred during the SARS-CoV-2 public health emergency.  Safety protocols were in place, including screening questions prior to the visit, additional usage of staff PPE, and extensive cleaning of exam room while observing appropriate contact time as indicated for disinfecting solutions.    [name], MD [place]

## 2019-05-07 NOTE — Patient Instructions (Signed)
Nice to see you. We will start you on an antibiotic to cover for possible cellulitis. We will check some lab work today and then contact you with the results.  We will likely have you see GI. Please try the gabapentin for your shingles.  If your dosage is the same that you have at home you can finish that and then start on what I prescribed.  If the dosage is not the same you should take what I have prescribed.

## 2019-05-09 ENCOUNTER — Encounter: Payer: Self-pay | Admitting: Family Medicine

## 2019-05-09 ENCOUNTER — Other Ambulatory Visit: Payer: Self-pay | Admitting: Family Medicine

## 2019-05-09 DIAGNOSIS — E538 Deficiency of other specified B group vitamins: Secondary | ICD-10-CM | POA: Insufficient documentation

## 2019-05-09 DIAGNOSIS — S82409A Unspecified fracture of shaft of unspecified fibula, initial encounter for closed fracture: Secondary | ICD-10-CM | POA: Insufficient documentation

## 2019-05-09 DIAGNOSIS — R1013 Epigastric pain: Secondary | ICD-10-CM | POA: Insufficient documentation

## 2019-05-09 DIAGNOSIS — B0229 Other postherpetic nervous system involvement: Secondary | ICD-10-CM | POA: Insufficient documentation

## 2019-05-09 NOTE — Assessment & Plan Note (Signed)
Not checking BP.  Continue current medication as it is adequately controlled in the office.

## 2019-05-09 NOTE — Assessment & Plan Note (Signed)
Epigastric pain with decreased appetite and some early satiety.  We will get lab work and then consider GI referral.

## 2019-05-09 NOTE — Assessment & Plan Note (Signed)
I encouraged the patient to wear the boot as prescribed by orthopedics.  He will continue to follow with orthopedics on this.  There is some erythema that potentially could be related to a cellulitis though could be from his injury.  Given the potential for cellulitis will cover with Keflex and recheck on Friday.  He will contact us sooner if it is worsening or he develops any systemic symptoms.

## 2019-05-09 NOTE — Assessment & Plan Note (Signed)
Adequately controlled.  Continue current regimen. 

## 2019-05-09 NOTE — Assessment & Plan Note (Signed)
B12 given today. 

## 2019-05-09 NOTE — Assessment & Plan Note (Signed)
Symptoms consistent with postherpetic neuralgia.  We will trial gabapentin.  Discussed monitoring for drowsiness and if that occurs he should let us know right away.

## 2019-05-10 ENCOUNTER — Encounter: Payer: Self-pay | Admitting: Family Medicine

## 2019-05-10 ENCOUNTER — Ambulatory Visit (INDEPENDENT_AMBULATORY_CARE_PROVIDER_SITE_OTHER): Payer: Medicare Other | Admitting: Family Medicine

## 2019-05-10 ENCOUNTER — Other Ambulatory Visit: Payer: Self-pay

## 2019-05-10 VITALS — BP 120/80 | HR 78 | Temp 96.1°F | Ht 73.0 in

## 2019-05-10 DIAGNOSIS — B0229 Other postherpetic nervous system involvement: Secondary | ICD-10-CM | POA: Diagnosis not present

## 2019-05-10 DIAGNOSIS — E118 Type 2 diabetes mellitus with unspecified complications: Secondary | ICD-10-CM

## 2019-05-10 DIAGNOSIS — L03116 Cellulitis of left lower limb: Secondary | ICD-10-CM

## 2019-05-10 DIAGNOSIS — S82832A Other fracture of upper and lower end of left fibula, initial encounter for closed fracture: Secondary | ICD-10-CM | POA: Diagnosis not present

## 2019-05-10 LAB — POCT GLYCOSYLATED HEMOGLOBIN (HGB A1C): Hemoglobin A1C: 6.3 % — AB (ref 4.0–5.6)

## 2019-05-10 NOTE — Patient Instructions (Signed)
Nice to see you. Please complete the course of antibiotics.  If the redness worsens at all at any point please let us know right away.  If you develop fevers or increased swelling or pain please be evaluated in person.

## 2019-05-12 DIAGNOSIS — L0390 Cellulitis, unspecified: Secondary | ICD-10-CM | POA: Insufficient documentation

## 2019-05-12 NOTE — Assessment & Plan Note (Signed)
Gabapentin has been effective.  He will continue on this.

## 2019-05-12 NOTE — Progress Notes (Signed)
  [name], MD Phone: [phone]  [name] [name] is a 86 y.o. male who presents today for follow-up.  Left foot cellulitis: The redness has been improving.  He has been taking his Keflex.  They note its not as bright.  No fevers.  Postherpetic neuralgia: He started on gabapentin and this has helped significantly.  He has no stinging sensation anymore.  Fibula fracture: Patient is taking pain medication related to this.  He did have vomiting x1 this morning with some nausea after taking his pain medicine on a relatively empty stomach.  They do report some swelling in his left ankle though it does go down at night and gets worse throughout the day.  Social History   Tobacco Use  Smoking Status Never Smoker  Smokeless Tobacco Never Used     ROS see history of present illness  Objective  Physical Exam Vitals:   05/10/19 1425  BP: 120/80  Pulse: 78  Temp: (!) 96.1 F (35.6 C)  SpO2: 98%    BP Readings from Last 3 Encounters:  05/10/19 120/80  05/07/19 130/80  03/19/19 130/70   Wt Readings from Last 3 Encounters:  05/07/19 211 lb 9.6 oz (96 kg)  03/19/19 216 lb 8 oz (98.2 kg)  01/07/19 212 lb 12.8 oz (96.5 kg)    Physical Exam Constitutional:      General: He is not in acute distress.    Appearance: He is not diaphoretic.  Cardiovascular:     Rate and Rhythm: Normal rate and regular rhythm.     Heart sounds: Normal heart sounds.  Pulmonary:     Effort: Pulmonary effort is normal.     Breath sounds: Normal breath sounds.  Musculoskeletal:     Comments: Area of erythema seems less bright and slightly smaller than several days ago, warmth is improved, there is some swelling in his ankle  Skin:    General: Skin is warm and dry.  Neurological:     Mental Status: He is alert.      Assessment/Plan: Please see individual problem list.  Postherpetic neuralgia Gabapentin has been effective.  He will continue on this.  Fibula fracture Suspect swelling  is related to his injury.  He will continue treatment by orthopedics.  He will monitor the swelling and if it worsens or is persistent he will be evaluated.  Discussed the need to eat an adequate meal prior to taking his pain medicine given that there is risk of nausea with narcotics.  Cellulitis Seems consistent with cellulitis.  He will complete his antibiotics and contact us if it does not resolve.  Type 2 diabetes mellitus with complication, without long-term current use of insulin (HCC) A1c to be completed today.    Orders Placed This Encounter  Procedures  . POCT HgB A1C    No orders of the defined types were placed in this encounter.   This visit occurred during the SARS-CoV-2 public health emergency.  Safety protocols were in place, including screening questions prior to the visit, additional usage of staff PPE, and extensive cleaning of exam room while observing appropriate contact time as indicated for disinfecting solutions.    [name], MD [place]

## 2019-05-12 NOTE — Assessment & Plan Note (Addendum)
Suspect swelling is related to his injury.  He will continue treatment by orthopedics.  He will monitor the swelling and if it worsens or is persistent he will be evaluated.  Discussed the need to eat an adequate meal prior to taking his pain medicine given that there is risk of nausea with narcotics.

## 2019-05-12 NOTE — Assessment & Plan Note (Signed)
Seems consistent with cellulitis.  He will complete his antibiotics and contact us if it does not resolve.

## 2019-05-12 NOTE — Assessment & Plan Note (Signed)
A1c to be completed today.

## 2019-05-14 ENCOUNTER — Other Ambulatory Visit: Payer: Self-pay | Admitting: Cardiovascular Disease

## 2019-05-14 ENCOUNTER — Encounter: Payer: Self-pay | Admitting: Gastroenterology

## 2019-05-14 ENCOUNTER — Telehealth: Payer: Self-pay | Admitting: Family Medicine

## 2019-05-14 NOTE — Telephone Encounter (Signed)
Left message for patient to call back and schedule Medicare Annual Wellness Visit (AWV) either virtually or audio only.  Last AWV 12.2.16; please schedule at anytime with Denisa O'Brien-Blaney at [facility].

## 2019-05-15 ENCOUNTER — Ambulatory Visit (INDEPENDENT_AMBULATORY_CARE_PROVIDER_SITE_OTHER): Payer: Medicare Other | Admitting: Gastroenterology

## 2019-05-15 ENCOUNTER — Encounter: Payer: Self-pay | Admitting: Gastroenterology

## 2019-05-15 DIAGNOSIS — R1013 Epigastric pain: Secondary | ICD-10-CM

## 2019-05-15 DIAGNOSIS — K5900 Constipation, unspecified: Secondary | ICD-10-CM | POA: Diagnosis not present

## 2019-05-15 DIAGNOSIS — R748 Abnormal levels of other serum enzymes: Secondary | ICD-10-CM

## 2019-05-15 MED ORDER — DULCOLAX 5 MG PO TBEC: 10.0000 mg | DELAYED_RELEASE_TABLET | Freq: Every day | ORAL | 0 refills | Status: AC | PRN

## 2019-05-15 NOTE — Progress Notes (Signed)
[name] [name] [address]  [place]  [place],  [address]  Main: [phone]  Fax: [phone]   Gastroenterology Consultation  Referring Provider:     [name], MD Primary Care Physician:  [name], MD Reason for Consultation:     Epigastric pain        HPI:   Virtual Visit via Telephone Note  I connected with patient on 05/15/19 at  1:45 PM EST by telephone and verified that I am speaking with the correct person using two identifiers.   I discussed the limitations, risks, security and privacy concerns of performing an evaluation and management service by telephone and the availability of in person appointments. I also discussed with the patient that there may be a patient responsible charge related to this service. The patient expressed understanding and agreed to proceed.  Location of the patient: Home Location of provider: Home Participating persons: Patient and provider only   History of Present Illness: Chief Complaint  Patient presents with  . Abdominal Pain    Patient states he has soreness in his epigastric area that is sore   . Constipation    but is on pain medication      [name] [name] is a 86 y.o. y/o male referred for consultation & management  by Dr. [name], [name], MD.  Patient is not very forthcoming about his complaints or symptoms, and eventually told us his ongoing symptoms after specific questioning.  Patient has had epigastric pain for 3 to 4 months, 6/10, dull, nonradiating.  Constant.  Unrelated to meals.  No nausea or vomiting.  No weight loss.  Weight was 212 pounds in November 2020, and is documented to be 8 to 11 pounds in March 2021.  Also reports that he has had early satiety, and does not like the site of food for the last 3 months.  Denies any anxiety or depression.  Denies that food makes him sick or nauseous.  Denies any pain with meals.  However, he does not think this is a problem for him as  he is not losing weight.  Has had a recent fracture and is on pain medications which are causing constipation and is taking senna as needed with it.  However, has not had a bowel movement in 4 days.  No blood in stool.  Is on omeprazole chronically for heartburn and states that controls it well without any breakthrough symptoms.  Lab work done by PCP was noted to have an elevated alk phos in the right upper quadrant ultrasound is pending this week.  Previous history: Patient was seen in June 2019.  Alk phos was noted to be elevated to 120 in 2019.  Chronic elevation, with 155 in March 2018, 131 in December 2016.  Bilirubin and transaminases normal.  Viral and autoimmune hepatitis work-up was negative.  His GGT was noted to be mildly elevated to 52, with upper limit of normal being 51.  His ultrasound was reported to be normal, "liver: No focal lesion identified.  Within normal limits and parenchymal echogenicity".   Repeat GGT was normal in October 2019.  His platelets were noted to be chronically low, 105 in June 2019. 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   screening colonoscopy done in May 2014, with tubular adenoma removed at the time, repeat recommended in 5 years  Past Medical History:  Diagnosis Date  . BPH (benign prostatic hyperplasia)   .  Cancer ([place])    skin  . Cataract   . Coronary artery disease    a. 4v CABG 01/2005; b. cath 06/05/13 showed patent grafts: LIMA-LAD, VG-D, VG-OM3, VG-dRCA, nl filling pressures, nl LV fxn  . Diastolic dysfunction    a. echo 11/5636: nl systolic fxn, mild LVH, diastolic relaxation abnormality, mildly enlarged LA, mild Ao insufficiency  . Dyspnea    with exertion  . ED (erectile dysfunction)   . GERD (gastroesophageal reflux disease)   . Hyperlipidemia   . Hypertension   . IDDM (insulin dependent diabetes mellitus) 2000  . Osteoarthritis   . Perirectal fistula   . Pneumonia  12/2016  . Shingles 09/04/2018  . Tubular adenoma of colon 07/2012  . Vertigo     Past Surgical History:  Procedure Laterality Date  . BACK SURGERY    . CARDIAC CATHETERIZATION  06/05/2013   [facility]   Marland Kitchen CARDIAC CATHETERIZATION N/A 09/24/2015   Procedure: Right Heart Cath and Coronary/Graft Angiography;  Surgeon: Minna Merritts, MD;  Location: [facility];  Service: Cardiovascular;  Laterality: N/A;  . CATARACT EXTRACTION  sept 2013   right  . CATARACT EXTRACTION W/PHACO Left 03/28/2017   Procedure: CATARACT EXTRACTION PHACO AND INTRAOCULAR LENS PLACEMENT (IOC);  Surgeon: Birder Robson, MD;  Location: [facility];  Service: Ophthalmology;  Laterality: Left;  [place] 00:42.0 AP% 15.0 CDE 6.31 Fluid Pack lot # [id number]  . CHOLECYSTECTOMY  05/15/2011   Procedure: LAPAROSCOPIC CHOLECYSTECTOMY;  Surgeon: Rolm Bookbinder, MD;  Location: [facility];  Service: General;  Laterality: N/A;  . COLONOSCOPY W/ POLYPECTOMY    . CORONARY ARTERY BYPASS GRAFT  2007   x 4  . EYE SURGERY    . FOOT SURGERY  2012   right foot  . JOINT REPLACEMENT  8/10   Right THR--[name]  . LEFT AND RIGHT HEART CATHETERIZATION WITH CORONARY/GRAFT ANGIOGRAM N/A 06/05/2013   Procedure: LEFT AND RIGHT HEART CATHETERIZATION WITH Beatrix Fetters;  Surgeon: Peter M Martinique, MD;  Location: [facility];  Service: Cardiovascular;  Laterality: N/A;  . LUMBAR LAMINECTOMY  1989  . Prostate photovaporization  5/16   Dr Budd Palmer  . RIGHT/LEFT HEART CATH AND CORONARY/GRAFT ANGIOGRAPHY N/A 01/05/2018   Procedure: RIGHT/LEFT HEART CATH AND CORONARY/GRAFT ANGIOGRAPHY;  Surgeon: Minna Merritts, MD;  Location: [facility];  Service: Cardiovascular;  Laterality: N/A;    Prior to Admission medications   Medication Sig Start Date End Date Taking? Authorizing Provider  acetaminophen (TYLENOL) 500 MG tablet Take 1,000 mg by mouth 2 (two) times daily.   Yes [provider]  amLODipine (NORVASC) 5 MG tablet  TAKE ONE TABLET TWICE DAILY 03/25/19  Yes Gollan, Kathlene November, MD  aspirin EC 81 MG tablet Take 81 mg by mouth daily.   Yes [provider]  BD INSULIN SYRINGE ULTRAFINE 31G X 15/64" 0.3 ML MISC USE AS DIRECTED 07/31/15  Yes Venia Carbon, MD  blood glucose meter kit and supplies KIT OneTouch Verio w/ Device Kit OneTouch Verio VI STRP OneTouch Delica Lancet Dev misc OneTouch Delica Lancets 95J or Fine  Please check CBGs fasting and 2 other times during the day  Dx Code E11.8 08/18/17  Yes [name], MD  carvedilol (COREG) 12.5 MG tablet TAKE ONE TABLET BY MOUTH TWICE DAILY 03/29/19  Yes Minna Merritts, MD  cyclobenzaprine (FLEXERIL) 10 MG tablet  10/13/18  Yes [provider]  finasteride (PROSCAR) 5 MG tablet Take 1 tablet (5 mg total) by  mouth daily. Patient taking differently: Take 5 mg by mouth at bedtime.  04/16/12  Yes Venia Carbon, MD  furosemide (LASIX) 20 MG tablet Take 1 tablet (20 mg total) by mouth daily. 03/19/19  Yes Gollan, Kathlene November, MD  gabapentin (NEURONTIN) 100 MG capsule Take 1 capsule (100 mg total) by mouth 3 (three) times daily. 05/07/19  Yes [name], MD  HYDROcodone-acetaminophen (NORCO/VICODIN) 5-325 MG tablet  11/08/18  Yes [provider]  Insulin Pen Needle (BD PEN NEEDLE NANO U/F) 32G X 4 MM MISC USE AS DIRECTED 09/26/18  Yes [name], MD  omeprazole (PRILOSEC) 20 MG capsule TAKE 1 CAPSULE BY MOUTH TWICE DAILY 01/14/19  Yes [name], MD  ondansetron (ZOFRAN) 4 MG tablet Take 1 tablet (4 mg total) by mouth every 8 (eight) hours as needed for nausea or vomiting. 10/15/18  Yes [name], MD  ramipril (ALTACE) 10 MG capsule Take 1 capsule (10 mg total) by mouth 2 (two) times daily. 04/09/19  Yes Gollan, Kathlene November, MD  rosuvastatin (CRESTOR) 20 MG tablet TAKE ONE TABLET EVERY DAY 05/14/19  Yes Gollan, Kathlene November, MD  Semaglutide,0.25 or 0.5MG/DOS, (OZEMPIC, 0.25 OR 0.5 MG/DOSE,) 2 MG/1.5ML SOPN Inject  0.25 mg into the skin once a week.   Yes [provider]  TOUJEO SOLOSTAR 300 UNIT/ML SOPN INJECT 50-60 UNITS INTO THE SKIN DAILY .INCREASE BY 1 UNITS/DAY UNTILFBS ARE BETWEEN 80-130. DO NOT EXCEED 60 UNITS PER DAY. Patient taking differently: Inject 46-48 Units into the skin daily.  09/21/17  Yes [name], MD  traMADol (ULTRAM) 50 MG tablet TAKE ONE TABLET BY MOUTH EVERY 8 HOURS AS NEEDED FOR UP TO 5 DAYS 04/18/19  Yes [name], MD  Vitamin D, Cholecalciferol, 1000 units TABS Take 1,000 Units by mouth daily.    Yes [provider]  zolpidem (AMBIEN) 5 MG tablet Take 1 tablet (5 mg total) by mouth at bedtime as needed for sleep. Stop trazadone not helpful 02/26/19  Yes [name], MD  bisacodyl (DULCOLAX) 5 MG EC tablet Take 2 tablets (10 mg total) by mouth daily as needed for up to 10 days for moderate constipation. 05/15/19 05/25/19  Virgel Manifold, MD  potassium chloride (KLOR-CON) 10 MEQ tablet Take 2 tablets (20 mEq total) by mouth once for 1 dose. Take with fluid pill 04/09/19 04/09/19  Minna Merritts, MD    Family History  Problem Relation Age of Onset  . Lung cancer Mother   . Heart disease Father   . Colon cancer Neg Hx   . Breast cancer Neg Hx      Social History   Tobacco Use  . Smoking status: Never Smoker  . Smokeless tobacco: Never Used  Substance Use Topics  . Alcohol use: Yes    Comment: occ cocktail  . Drug use: No    Allergies as of 05/15/2019 - Review Complete 05/15/2019  Allergen Reaction Noted  . Cortisone Other (See Comments) 06/24/2016  . Metformin Diarrhea 09/05/2016  . Metformin and related Diarrhea 09/05/2016    Review of Systems:    All systems reviewed and negative except where noted in HPI.   Observations/Objective:  Labs: CBC    Component Value Date/Time   WBC 7.1 05/07/2019 1245   RBC 3.96 (L) 05/07/2019 1245   HGB 13.1 05/07/2019 1245   HGB 14.4 08/31/2017 1125   HCT 36.6 (L) 05/07/2019  1245   HCT 40.1 08/31/2017 1125   PLT  133.0 (L) 05/07/2019 1245   PLT 105 (L) 08/31/2017 1125   MCV 92.4 05/07/2019 1245   MCV 93 08/31/2017 1125   MCV 92 02/22/2013 1654   MCH 33.3 09/08/2018 0508   MCHC 35.8 05/07/2019 1245   RDW 13.0 05/07/2019 1245   RDW 13.1 08/31/2017 1125   RDW 13.3 02/22/2013 1654   LYMPHSABS 1.5 02/04/2019 0958   MONOABS 0.6 02/04/2019 0958   EOSABS 0.2 02/04/2019 0958   BASOSABS 0.1 02/04/2019 0958   CMP     Component Value Date/Time   NA 139 12/19/2018 1035   NA 141 08/31/2017 1125   NA 139 02/22/2013 1654   K 3.8 12/19/2018 1035   K 4.5 02/22/2013 1654   CL 106 12/19/2018 1035   CL 109 (H) 02/22/2013 1654   CO2 25 12/19/2018 1035   CO2 24 02/22/2013 1654   GLUCOSE 235 (H) 12/19/2018 1035   GLUCOSE 197 (H) 02/22/2013 1654   BUN 11 12/19/2018 1035   BUN 19 08/31/2017 1125   BUN 20 (H) 02/22/2013 1654   CREATININE 1.12 12/19/2018 1035   CREATININE 1.18 02/22/2013 1654   CREATININE 1.13 05/25/2012 1630   CALCIUM 8.7 12/19/2018 1035   CALCIUM 9.1 02/22/2013 1654   PROT 6.1 05/07/2019 1245   PROT 6.2 08/31/2017 1125   PROT 6.2 (L) 02/22/2013 1654   ALBUMIN 3.7 05/07/2019 1245   ALBUMIN 4.0 08/31/2017 1125   ALBUMIN 3.5 02/22/2013 1654   AST 20 05/07/2019 1245   AST 20 02/22/2013 1654   ALT 20 05/07/2019 1245   ALT 42 02/22/2013 1654   ALKPHOS 183 (H) 05/07/2019 1245   ALKPHOS 113 02/22/2013 1654   BILITOT 0.8 05/07/2019 1245   BILITOT 1.1 08/31/2017 1125   BILITOT 0.6 02/22/2013 1654   GFRNONAA 40 (L) 09/13/2018 1203   GFRNONAA 59 (L) 02/22/2013 1654   GFRAA 47 (L) 09/13/2018 1203   GFRAA >60 02/22/2013 1654    Imaging Studies: No results found.  Assessment and Plan:   [name] [name] is a 86 y.o. y/o male has been referred for epigastric pain  Assessment and Plan: Epigastric pain has been present for 3 to 4 months.  No weight loss.  Is on PPI once daily which is controlling his heartburn but not his epigastric pain.  It is  not related to meals.  Will await right upper quadrant ultrasound since his alk phos is also elevated.  If right upper quadrant ultrasound is unrevealing, proceed with CTA given his aversion to food and early satiety.  However, absence of postprandial pain is reassuring  Repeat CMP with GGT.  FibroSure was F4 in October 2019, however no other clinical evidence of cirrhosis.  Await ultrasound  Also due for surveillance colonoscopy and can await CTA results to evaluate if patient needs EGD as well  Has been taking senna for constipation due to pain medications due to recent fracture.  However, has not had a bowel movement in 4 days which could also be contributing to abdominal pain.  Start Dulcolax as needed.  Follow Up Instructions:   I discussed the assessment and treatment plan with the patient. The patient was provided an opportunity to ask questions and all were answered. The patient agreed with the plan and demonstrated an understanding of the instructions.   The patient was advised to call back or seek an in-person evaluation if the symptoms worsen or if the condition fails to improve as anticipated.  I provided 12 minutes of non-face-to-face time during  this encounter.   Virgel Manifold, MD  Speech recognition software was used to dictate the above note.

## 2019-05-17 ENCOUNTER — Ambulatory Visit: Payer: Medicare Other

## 2019-05-20 DIAGNOSIS — R748 Abnormal levels of other serum enzymes: Secondary | ICD-10-CM | POA: Diagnosis not present

## 2019-05-20 DIAGNOSIS — S82832A Other fracture of upper and lower end of left fibula, initial encounter for closed fracture: Secondary | ICD-10-CM | POA: Diagnosis not present

## 2019-05-21 LAB — COMPREHENSIVE METABOLIC PANEL
ALT: 10 IU/L (ref 0–44)
AST: 20 IU/L (ref 0–40)
Albumin/Globulin Ratio: 1.8 (ref 1.2–2.2)
Albumin: 3.9 g/dL (ref 3.6–4.6)
Alkaline Phosphatase: 134 IU/L — ABNORMAL HIGH (ref 39–117)
BUN/Creatinine Ratio: 12 (ref 10–24)
BUN: 19 mg/dL (ref 8–27)
Bilirubin Total: 0.5 mg/dL (ref 0.0–1.2)
CO2: 21 mmol/L (ref 20–29)
Calcium: 8.9 mg/dL (ref 8.6–10.2)
Chloride: 108 mmol/L — ABNORMAL HIGH (ref 96–106)
Creatinine, Ser: 1.53 mg/dL — ABNORMAL HIGH (ref 0.76–1.27)
GFR calc Af Amer: 48 mL/min/{1.73_m2} — ABNORMAL LOW (ref >59)
GFR calc non Af Amer: 41 mL/min/{1.73_m2} — ABNORMAL LOW (ref >59)
Globulin, Total: 2.2 g/dL (ref 1.5–4.5)
Glucose: 150 mg/dL — ABNORMAL HIGH (ref 65–99)
Potassium: 4.6 mmol/L (ref 3.5–5.2)
Sodium: 142 mmol/L (ref 134–144)
Total Protein: 6.1 g/dL (ref 6.0–8.5)

## 2019-05-21 LAB — GAMMA GT: GGT: 49 IU/L (ref 0–65)

## 2019-05-22 ENCOUNTER — Other Ambulatory Visit: Payer: Self-pay | Admitting: Family Medicine

## 2019-05-22 DIAGNOSIS — E118 Type 2 diabetes mellitus with unspecified complications: Secondary | ICD-10-CM

## 2019-05-23 ENCOUNTER — Telehealth: Payer: Self-pay

## 2019-05-23 NOTE — Telephone Encounter (Signed)
Called and patient verbalized understanding. Gave patient the number to central scheduled so patient can get the Ultrasound scheduled. Patient states he will do this and follow up with PCP

## 2019-05-23 NOTE — Telephone Encounter (Signed)
-----   Message from Virgel Manifold, MD sent at 05/23/2019 10:31 AM EDT ----- Caryl Pina please let the patient know, his bloodwork showed mildly elevated alkaline phosphatase level. However, he has a normal GGT. This means the elevated alkaline phos level elevation is not coming from his liver and could be elevated due to other causes like his recent fracture. He should follow up with Dr. [name] about his mildly elevated kidney function as well. I am forwarding the results to him.   Dr. [name] ordered a RUQ U/S and patient should get this scheduled as it would help [place] evaluate his abdominal pain.

## 2019-05-26 ENCOUNTER — Telehealth: Payer: Self-pay | Admitting: Family Medicine

## 2019-05-26 NOTE — Telephone Encounter (Signed)
-----   Message from Virgel Manifold, MD sent at 05/23/2019 10:31 AM EDT ----- Caryl Pina please let the patient know, his bloodwork showed mildly elevated alkaline phosphatase level. However, he has a normal GGT. This means the elevated alkaline phos level elevation is not coming from his liver and could be elevated due to other causes like his recent fracture. He should follow up with Dr. [name] about his mildly elevated kidney function as well. I am forwarding the results to him.   Dr. [name] ordered a RUQ U/S and patient should get this scheduled as it would help [place] evaluate his abdominal pain.

## 2019-05-26 NOTE — Telephone Encounter (Signed)
Please call the patient. His kidney function was worsened compared to previously on his recent labs. Has he started on any new medications? Any anti-iflammatories? Has he been urinating normally?

## 2019-05-27 NOTE — Telephone Encounter (Signed)
I called and spoke with the patient and he stated that he has a broken leg and yes he has been taking ibuprofen and pain medications, He has had normal urination. Patient  states he also has a hemorrhoids issues that is draining and he is treating it with witch hazel and tucks and wants to know does he have to have it lanced ot is there any other treatment he should be using.  Please advise.  [name] [name],cma

## 2019-05-27 NOTE — Telephone Encounter (Signed)
Noted. He should avoid ibuprofen and other antiinflammatories. We should recheck a BMET 1-2 weeks after he stops those medications.

## 2019-05-28 NOTE — Telephone Encounter (Signed)
Do you have any advice about his hemorrhoids.  Louretta Tantillo,cma

## 2019-05-28 NOTE — Telephone Encounter (Signed)
Its difficult to give accurate advice on hemorrhoids without an exam to determine the extent of his issues. He can do epsom salt soaks if he is able to get in the bath tub. He should have an in person exam to determine if it needs to be lanced or if there is some other cause.

## 2019-05-28 NOTE — Telephone Encounter (Signed)
LVM for the patient to return a call to me.  Jatavius Ellenwood,cma  

## 2019-05-29 ENCOUNTER — Other Ambulatory Visit: Payer: Self-pay

## 2019-05-29 ENCOUNTER — Ambulatory Visit
Admission: RE | Admit: 2019-05-29 | Discharge: 2019-05-29 | Disposition: A | Discharge status: Home / Self Care | Payer: Medicare Other | Source: Ambulatory Visit | Attending: Family Medicine | Admitting: Family Medicine

## 2019-05-29 DIAGNOSIS — R1013 Epigastric pain: Secondary | ICD-10-CM | POA: Diagnosis not present

## 2019-05-31 ENCOUNTER — Telehealth: Payer: Medicare Other | Admitting: Family Medicine

## 2019-05-31 MED ORDER — ESOMEPRAZOLE MAGNESIUM 20 MG PO CPDR: 20.0000 mg | DELAYED_RELEASE_CAPSULE | Freq: Every day | ORAL | 1 refills | Status: DC

## 2019-05-31 NOTE — Telephone Encounter (Signed)
Nexium sent to pharmacy for the patient to try in place of the omeprazole.

## 2019-05-31 NOTE — Telephone Encounter (Signed)
I called and spoke with the patient to see how he was doing with the hemorrhoids, patient stated they are gone away but he is having a very hard time with Omeprazole, he has had sour burps and he added Tums and nothing is helping he wants to know if there is anything else he could take because the omeprazole is not working.  Danashia Landers,cma

## 2019-05-31 NOTE — Telephone Encounter (Signed)
Pt returned call and would like a call back the sooner the better.

## 2019-05-31 NOTE — Telephone Encounter (Signed)
I called and spoke to the patient and informed him that his new medication to take the place of the omeprazole was sent to his pharmacy, he understood.  [name] [name],cma

## 2019-05-31 NOTE — Addendum Note (Signed)
Addended by: [name] on: 05/31/2019 04:56 PM   Modules accepted: Orders

## 2019-06-18 DIAGNOSIS — E113293 Type 2 diabetes mellitus with mild nonproliferative diabetic retinopathy without macular edema, bilateral: Secondary | ICD-10-CM | POA: Diagnosis not present

## 2019-06-18 DIAGNOSIS — M79674 Pain in right toe(s): Secondary | ICD-10-CM | POA: Diagnosis not present

## 2019-06-18 DIAGNOSIS — M109 Gout, unspecified: Secondary | ICD-10-CM | POA: Diagnosis not present

## 2019-06-18 DIAGNOSIS — S82832D Other fracture of upper and lower end of left fibula, subsequent encounter for closed fracture with routine healing: Secondary | ICD-10-CM | POA: Diagnosis not present

## 2019-06-18 LAB — HM DIABETES EYE EXAM

## 2019-06-19 ENCOUNTER — Ambulatory Visit: Payer: Medicare Other | Admitting: Gastroenterology

## 2019-06-21 DIAGNOSIS — E1121 Type 2 diabetes mellitus with diabetic nephropathy: Secondary | ICD-10-CM | POA: Diagnosis not present

## 2019-06-21 DIAGNOSIS — Z794 Long term (current) use of insulin: Secondary | ICD-10-CM | POA: Diagnosis not present

## 2019-06-21 DIAGNOSIS — I2510 Atherosclerotic heart disease of native coronary artery without angina pectoris: Secondary | ICD-10-CM | POA: Diagnosis not present

## 2019-06-21 DIAGNOSIS — E1159 Type 2 diabetes mellitus with other circulatory complications: Secondary | ICD-10-CM | POA: Diagnosis not present

## 2019-06-21 DIAGNOSIS — N1832 Chronic kidney disease, stage 3b: Secondary | ICD-10-CM | POA: Diagnosis not present

## 2019-07-19 DIAGNOSIS — S82832D Other fracture of upper and lower end of left fibula, subsequent encounter for closed fracture with routine healing: Secondary | ICD-10-CM | POA: Diagnosis not present

## 2019-07-30 DIAGNOSIS — H01009 Unspecified blepharitis unspecified eye, unspecified eyelid: Secondary | ICD-10-CM | POA: Diagnosis not present

## 2019-07-31 ENCOUNTER — Telehealth: Payer: Self-pay | Admitting: Family Medicine

## 2019-07-31 NOTE — Telephone Encounter (Signed)
Pt wants to know if he needs b12 injections? Please advise

## 2019-08-01 NOTE — Telephone Encounter (Signed)
Caleld and LVM for the patient to call back and schedule b12 injections with a nurse visit.  Nina,cma

## 2019-08-01 NOTE — Telephone Encounter (Signed)
He should be getting B12 injections once monthly. Please have him scheduled for a nurse visit for b12 1000 mcg once monthly.

## 2019-08-01 NOTE — Telephone Encounter (Signed)
Patient is calling to see if he still needs B12 injections he had his last one in March of this year. Please advise.  [name] [name],cma

## 2019-08-06 NOTE — Telephone Encounter (Signed)
Reached the patient and scheduled a b12 injection this month with the patient and explained he will need this monthly. Lesia Hausen

## 2019-08-07 ENCOUNTER — Other Ambulatory Visit: Payer: Self-pay | Admitting: Family Medicine

## 2019-08-12 ENCOUNTER — Other Ambulatory Visit: Payer: Self-pay | Admitting: Cardiovascular Disease

## 2019-08-21 ENCOUNTER — Other Ambulatory Visit: Payer: Self-pay

## 2019-08-21 ENCOUNTER — Ambulatory Visit (INDEPENDENT_AMBULATORY_CARE_PROVIDER_SITE_OTHER): Payer: Medicare Other

## 2019-08-21 DIAGNOSIS — E538 Deficiency of other specified B group vitamins: Secondary | ICD-10-CM | POA: Diagnosis not present

## 2019-08-21 MED ORDER — CYANOCOBALAMIN 1000 MCG/ML IJ SOLN
1000.0000 ug | Freq: Once | INTRAMUSCULAR | Status: AC
  Administered 2019-08-21: 1000 ug via INTRAMUSCULAR

## 2019-08-21 NOTE — Progress Notes (Signed)
Patient presented for B 12 injection to right deltoid, patient voiced no concerns nor showed any signs of distress during injection. 

## 2019-08-22 ENCOUNTER — Ambulatory Visit: Payer: Medicare Other

## 2019-08-23 ENCOUNTER — Other Ambulatory Visit: Payer: Self-pay | Admitting: Family Medicine

## 2019-08-23 DIAGNOSIS — G4700 Insomnia, unspecified: Secondary | ICD-10-CM

## 2019-08-23 NOTE — Telephone Encounter (Signed)
Patient is due for follow-up.  Please contact him to get this scheduled so we can discuss this medication and follow-up on his other medical issues.  Thanks.

## 2019-08-23 NOTE — Telephone Encounter (Signed)
Refill request for [name] [name], last seen 05-10-19, last filled 02-26-20.  Please advise.

## 2019-08-23 NOTE — Telephone Encounter (Signed)
Left detailed message for patient to return call back to schedule an appointment.

## 2019-09-03 ENCOUNTER — Other Ambulatory Visit: Payer: Self-pay | Admitting: Family Medicine

## 2019-09-25 DIAGNOSIS — L0889 Other specified local infections of the skin and subcutaneous tissue: Secondary | ICD-10-CM | POA: Diagnosis not present

## 2019-09-25 DIAGNOSIS — L0103 Bullous impetigo: Secondary | ICD-10-CM | POA: Diagnosis not present

## 2019-09-25 DIAGNOSIS — S80822A Blister (nonthermal), left lower leg, initial encounter: Secondary | ICD-10-CM | POA: Diagnosis not present

## 2019-09-28 ENCOUNTER — Other Ambulatory Visit: Payer: Self-pay | Admitting: Cardiovascular Disease

## 2019-10-02 ENCOUNTER — Other Ambulatory Visit: Payer: Self-pay | Admitting: Family Medicine

## 2019-10-14 ENCOUNTER — Other Ambulatory Visit: Payer: Self-pay | Admitting: Family Medicine

## 2019-10-21 ENCOUNTER — Ambulatory Visit: Payer: Medicare Other | Admitting: Cardiovascular Disease

## 2019-10-21 ENCOUNTER — Other Ambulatory Visit: Payer: Self-pay

## 2019-10-21 ENCOUNTER — Encounter: Payer: Self-pay | Admitting: Cardiovascular Disease

## 2019-10-21 VITALS — BP 110/54 | HR 62 | Ht 72.0 in | Wt 211.5 lb

## 2019-10-21 DIAGNOSIS — I208 Other forms of angina pectoris: Secondary | ICD-10-CM

## 2019-10-21 DIAGNOSIS — I5032 Chronic diastolic (congestive) heart failure: Secondary | ICD-10-CM | POA: Diagnosis not present

## 2019-10-21 MED ORDER — AMLODIPINE BESYLATE 5 MG PO TABS: 5.0000 mg | ORAL_TABLET | Freq: Every day | ORAL | 3 refills | Status: DC

## 2019-10-21 NOTE — Patient Instructions (Addendum)
Ace wrap,. comperssion hose, Leg elevation   Medication Instructions:  Cut the amlodipine down to 5 mg daily Monitor blood pressure  Hold the crestor for a few weeks to see if cramps get better If better, call the office  If you need a refill on your cardiac medications before your next appointment, please call your pharmacy.    Lab work: No new labs needed   If you have labs (blood work) drawn today and your tests are completely normal, you will receive your results only by:  [place] (if you have MyChart) OR  A paper copy in the mail If you have any lab test that is abnormal or we need to change your treatment, we will call you to review the results.   Testing/Procedures: No new testing needed   Follow-Up: At [facility], you and your health needs are our priority.  As part of our continuing mission to provide you with exceptional heart care, we have created designated Provider Care Teams.  These Care Teams include your primary Cardiologist (physician) and Advanced Practice Providers (APPs -  Physician Assistants and Nurse Practitioners) who all work together to provide you with the care you need, when you need it.   You will need a follow up appointment in 6 months    Providers on your designated Care Team:    [name], NP  Christell Faith, PA-C  Marrianne Mood, PA-C  Any Other Special Instructions Will Be Listed Below (If Applicable).  COVID-19 Vaccine Information can be found at: [email or web address] For questions related to vaccine distribution or appointments, please email [email or web address] or call [phone].

## 2019-10-21 NOTE — Progress Notes (Signed)
I connected with  [name] [name] on 10/21/19   Evaluation Performed:  Follow-up visit  Date:  10/21/2019   ID:  [name] [name], DOB 1934/01/30, MRN [id number]  Patient Location:  [address] [id number]   Provider location:   [facility], [place] office  PCP:  [name], MD  Cardiologist:  Arvid Right [facility]   Chief Complaint  Patient presents with  . other    Pt. c/o LE edema and shortness of breath. Meds reviewed by the pt. verbally.     History of Present Illness:    [name] [name] is a 86 y.o. male  past medical history of coronary artery disease, bypass surgery in November 2006, Stress test March 2017, cath 09/2015, medical management Occluded proximal LAD  Critical/severe native RCA disease  Vein graft to the diagonal is occluded  Other vein grafts patent to the RCA/PDA, vein graft to the OM 3, LIMA graft  EF 55% in 12/2013 diabetes, numbers running high obesity,  hyperlipidemia,  hypertension,  erectile dysfunction Chronic SOB, nonsmoker Postherpetic neuralgia left flank Stable angina, chronic diastolic CHF, chronic shortness of breath Pulmonary hypertension on right heart catheterization who presents for routine followup of his coronary artery disease, and hypertension  Wife presents with him today Reports that he has been doing well, Weight is down 5 pounds  Going on a trip, going for 3 weeks  Heading out [place]  Denies any significant chest pain concerning for angina Still with chronic mild shortness of breath, no regular exercise program  Troubled with leg cramping at nighttime Also has worsening leg swelling left lower extremity, periodic blisters on the left leg  EKG personally reviewed by myself on todays visit Shows normal sinus rhythm rate 62 bpm no significant ST-T wave changes  Other past medical history reviewed Last cath 01/2018 Graft to the right patent, native left circumflex  patent, LIMA graft to the LAD patent.  Native RCA is occluded, vein graft to left circumflex occluded, vein graft to the diagonal occluded --Moderate pulmonary hypertension by right heart pressures   Prior PFT mild to moderate obstruction  History of Bell's palsy, Left side of face March 2019  2 days after doing cataract surgery Completed prednisone and antivirals Improved sx, still difficulty smoking, right facial weakness  Seen in the past by specialist for his back in [place], had MRI showing arthritis no spinal stenosis  CT scan of his abdomen reviewed showing mild diffuse aortic atherosclerosis  Echocardiogram April 2009 shows normal systolic function with mild LVH, diastolic relaxation abnormality, mildly enlarged left atrium, mild aortic insufficiency.  Cardiac catheterization January 05, 2018  Echocardiogram January 08, 2018 Left ventricle: The cavity size was normal. There was mild concentric hypertrophy. Systolic function was normal. The estimated ejection fraction was in the range of 55% to 60%. Wall motion was normal; there were no regional wall motion abnormalities. Features are consistent with a pseudonormal left ventricular filling pattern, with concomitant abnormal relaxation and increased filling pressure (grade 2 diastolic dysfunction). - Aortic valve: There was trivial regurgitation. - Mitral valve: Calcified annulus. - Left atrium: The atrium was mildly dilated. - Pulmonary arteries: Systolic pressure was mildly increased.  Past Medical History:  Diagnosis Date  . BPH (benign prostatic hyperplasia)   . Cancer ([place])    skin  . Cataract   . Coronary artery disease    a. 4v CABG 01/2005; b. cath 06/05/13 showed patent grafts: LIMA-LAD,  VG-D, VG-OM3, VG-dRCA, nl filling pressures, nl LV fxn  . Diastolic dysfunction    a. echo 03/1939: nl systolic fxn, mild LVH, diastolic relaxation abnormality, mildly enlarged LA, mild Ao insufficiency  .  Dyspnea    with exertion  . ED (erectile dysfunction)   . GERD (gastroesophageal reflux disease)   . Hyperlipidemia   . Hypertension   . IDDM (insulin dependent diabetes mellitus) 2000  . Osteoarthritis   . Perirectal fistula   . Pneumonia 12/2016  . Shingles 09/04/2018  . Tubular adenoma of colon 07/2012  . Vertigo    Past Surgical History:  Procedure Laterality Date  . BACK SURGERY    . CARDIAC CATHETERIZATION  06/05/2013   [facility]   Marland Kitchen CARDIAC CATHETERIZATION N/A 09/24/2015   Procedure: Right Heart Cath and Coronary/Graft Angiography;  Surgeon: Minna Merritts, MD;  Location: [place] [facility];  Service: Cardiovascular;  Laterality: N/A;  . CATARACT EXTRACTION  sept 2013   right  . CATARACT EXTRACTION W/PHACO Left 03/28/2017   Procedure: CATARACT EXTRACTION PHACO AND INTRAOCULAR LENS PLACEMENT (IOC);  Surgeon: Birder Robson, MD;  Location: [facility];  Service: Ophthalmology;  Laterality: Left;  [place] 00:42.0 AP% 15.0 CDE 6.31 Fluid Pack lot # [id number]  . CHOLECYSTECTOMY  05/15/2011   Procedure: LAPAROSCOPIC CHOLECYSTECTOMY;  Surgeon: Rolm Bookbinder, MD;  Location: [place];  Service: General;  Laterality: N/A;  . COLONOSCOPY W/ POLYPECTOMY    . CORONARY ARTERY BYPASS GRAFT  2007   x 4  . EYE SURGERY    . FOOT SURGERY  2012   right foot  . JOINT REPLACEMENT  8/10   Right THR--[name]  . LEFT AND RIGHT HEART CATHETERIZATION WITH CORONARY/GRAFT ANGIOGRAM N/A 06/05/2013   Procedure: LEFT AND RIGHT HEART CATHETERIZATION WITH Beatrix Fetters;  Surgeon: Peter M Martinique, MD;  Location: [facility];  Service: Cardiovascular;  Laterality: N/A;  . LUMBAR LAMINECTOMY  1989  . Prostate photovaporization  5/16   Dr Budd Palmer  . RIGHT/LEFT HEART CATH AND CORONARY/GRAFT ANGIOGRAPHY N/A 01/05/2018   Procedure: RIGHT/LEFT HEART CATH AND CORONARY/GRAFT ANGIOGRAPHY;  Surgeon: Minna Merritts, MD;  Location: [place] [facility];  Service: Cardiovascular;  Laterality: N/A;      Current Meds  Medication Sig  . acetaminophen (TYLENOL) 500 MG tablet Take 1,000 mg by mouth 2 (two) times daily.  Marland Kitchen amLODipine (NORVASC) 5 MG tablet TAKE ONE TABLET TWICE DAILY  . aspirin EC 81 MG tablet Take 81 mg by mouth daily.  . BD INSULIN SYRINGE ULTRAFINE 31G X 15/64" 0.3 ML MISC USE AS DIRECTED  . BD PEN NEEDLE NANO U/F 32G X 4 MM MISC USE AS DIRECTED  . blood glucose meter kit and supplies KIT OneTouch Verio w/ Device Kit OneTouch Verio VI STRP OneTouch Delica Lancet Dev misc OneTouch Delica Lancets 81E or Fine  Please check CBGs fasting and 2 other times during the day  Dx Code E11.8  . carvedilol (COREG) 12.5 MG tablet TAKE ONE TABLET BY MOUTH TWICE DAILY  . cyclobenzaprine (FLEXERIL) 10 MG tablet   . esomeprazole (NEXIUM) 20 MG capsule TAKE 1 CAPSULE BY MOUTH EVERY DAY. TAKE 30 MINUTES BEFORE BREAKFAST. DISCONTINUE OMEPRAZOLE  . finasteride (PROSCAR) 5 MG tablet Take 1 tablet (5 mg total) by mouth daily. (Patient taking differently: Take 5 mg by mouth at bedtime. )  . furosemide (LASIX) 20 MG tablet Take 1 tablet (20 mg total) by mouth daily.  Marland Kitchen gabapentin (NEURONTIN) 100 MG capsule TAKE 1 CAPSULE BY MOUTH  3 TIMES DAILY  . HYDROcodone-acetaminophen (NORCO/VICODIN) 5-325 MG tablet   . ondansetron (ZOFRAN) 4 MG tablet Take 1 tablet (4 mg total) by mouth every 8 (eight) hours as needed for nausea or vomiting.  . potassium chloride (KLOR-CON) 10 MEQ tablet Take 2 tablets (20 mEq total) by mouth once for 1 dose. Take with fluid pill  . ramipril (ALTACE) 10 MG capsule Take 1 capsule (10 mg total) by mouth 2 (two) times daily.  . rosuvastatin (CRESTOR) 20 MG tablet TAKE ONE TABLET EVERY DAY  . Semaglutide,0.25 or 0.5MG/DOS, (OZEMPIC, 0.25 OR 0.5 MG/DOSE,) 2 MG/1.5ML SOPN Inject 0.25 mg into the skin once a week.  Nelva Nay SOLOSTAR 300 UNIT/ML Solostar Pen INJECT 50 UNITS DAILY MAY INCREASE 2U EVERY 3 DAYS UNTIL FBG IS AT GOAL UP TO 80 UNITS DAILY  . traMADol (ULTRAM) 50 MG  tablet TAKE ONE TABLET BY MOUTH EVERY 8 HOURS AS NEEDED FOR UP TO 5 DAYS  . Vitamin D, Cholecalciferol, 1000 units TABS Take 1,000 Units by mouth daily.   Marland Kitchen zolpidem (AMBIEN) 5 MG tablet Take 1 tablet (5 mg total) by mouth at bedtime as needed for sleep. Stop trazadone not helpful     Allergies:   Cortisone, Metformin, and Metformin and related   Social History   Tobacco Use  . Smoking status: Never Smoker  . Smokeless tobacco: Never Used  Vaping Use  . Vaping Use: Never used  Substance Use Topics  . Alcohol use: Yes    Comment: occ cocktail  . Drug use: No     Family Hx: The patient's family history includes Heart disease in his father; Lung cancer in his mother. There is no history of Colon cancer or Breast cancer.  ROS:   Please see the history of present illness.    Review of Systems  Constitutional: Negative.   HENT: Negative.   Respiratory: Positive for shortness of breath.   Cardiovascular: Positive for leg swelling.  Gastrointestinal: Negative.   Musculoskeletal: Negative.   Neurological: Negative.   Psychiatric/Behavioral: Negative.   All other systems reviewed and are negative.    Labs/Other Tests and Data Reviewed:    Recent Labs: 12/07/2018: TSH 0.99 05/07/2019: Hemoglobin 13.1; Platelets 133.0 05/20/2019: ALT 10; BUN 19; Creatinine, Ser 1.53; Potassium 4.6; Sodium 142   Recent Lipid Panel Lab Results  Component Value Date/Time   CHOL 105 02/04/2019 09:58 AM   CHOL 125 12/27/2017 02:46 PM   TRIG 78.0 02/04/2019 09:58 AM   TRIG 289 (H) 12/27/2017 02:46 PM   HDL 32.70 (L) 02/04/2019 09:58 AM   HDL 39 (L) 07/04/2014 08:01 AM   CHOLHDL 3 02/04/2019 09:58 AM   LDLCALC 57 02/04/2019 09:58 AM   LDLCALC 110 (H) 07/04/2014 08:01 AM   LDLDIRECT 65.0 06/02/2016 02:17 PM    Wt Readings from Last 3 Encounters:  10/21/19 211 lb 8 oz (95.9 kg)  05/07/19 211 lb 9.6 oz (96 kg)  03/19/19 216 lb 8 oz (98.2 kg)     Exam:    BP (!) 110/54 (BP Location: Left Arm,  Patient Position: Sitting, Cuff Size: Normal)   Pulse 62   Ht 6' (1.829 m)   Wt 211 lb 8 oz (95.9 kg)   SpO2 98%   BMI 28.68 kg/m   Constitutional:  oriented to person, place, and time. No distress.  HENT:  Head: Grossly normal Eyes:  no discharge. No scleral icterus.  Neck: No JVD, no carotid bruits  Cardiovascular: Regular rate and rhythm, no  murmurs appreciated Pulmonary/Chest: Clear to auscultation bilaterally, no wheezes or rails Abdominal: Soft.  no distension.  no tenderness.  Musculoskeletal: Normal range of motion Neurological:  normal muscle tone. Coordination normal. No atrophy Skin: Skin warm and dry Psychiatric: normal affect, pleasant   ASSESSMENT & PLAN:    Pulmonary hypertension Previous PFTs, obstructive disease,  Chronic mild shortness of breath Recommend continued lifestyle modification, weight loss Continue Lasix 20 daily  Coronary artery disease of native artery of native heart with stable angina pectoris [facility]) Medical management, recent catheterization Currently with stable symptoms  Chronic diastolic CHF (congestive heart failure) (HCC) Continue Lasix 20 daily Moderate fluid intake Is not eating out as much  Type 2 diabetes mellitus with complication, without long-term current use of insulin ([place]) We have encouraged continued exercise, careful diet management in an effort to lose weight. Followed by endocrinology  Mixed hyperlipidemia Cholesterol is at goal on the current lipid regimen. No changes to the medications were made.  Essential hypertension Blood pressure running low, recommend he decrease amlodipine down to 5 mg daily especially in light of his worsening left lower extremity edema If blood pressure continues to run low given recent weight loss, we could potentially hold the amlodipine He will monitor at home  Stable angina [facility]) Continue current medications  S/P CABG x 4 Prior catheterization in the 2019   CKD (chronic kidney  disease) stage 3, GFR 30-59 ml/min (HCC) Lasix daily, stable renal function     Total encounter time more than 25 minutes  Greater than 50% was spent in counseling and coordination of care with the patient    Signed, Ida Rogue, MD  10/21/2019 4:30 PM    [place] Office [address], [place], [place] [address]

## 2019-10-23 ENCOUNTER — Other Ambulatory Visit: Payer: Self-pay | Admitting: Family Medicine

## 2019-12-02 ENCOUNTER — Telehealth: Payer: Self-pay | Admitting: Cardiovascular Disease

## 2019-12-02 NOTE — Telephone Encounter (Signed)
Pt c/o medication issue:  1. Name of Medication: rosuvastatin  2. How are you currently taking this medication (dosage and times per day)? holding  3. Are you having a reaction (difficulty breathing--STAT)? No   4. What is your medication issue? Symptoms improved since holding and patient wants to know if there is a replacement since he cannot tolerate this one

## 2019-12-02 NOTE — Telephone Encounter (Signed)
Spoke with patient and he reports that his symptoms improved holding the crestor and would like to know what other medication you would like him to try. He also has only been taking the amlodipine 5 mg once daily as directed. Advised that I would forward over to Dr. Rockey Situ for further recommendations and to continue other medications as ordered. He verbalized understanding with no further questions at this time. Let him know that I would call back once I hear from Dr. Rockey Situ. He was appreciative for the time.

## 2019-12-04 MED ORDER — ROSUVASTATIN CALCIUM 10 MG PO TABS
10.0000 mg | ORAL_TABLET | Freq: Every day | ORAL | 3 refills | Status: DC
Start: 2019-12-04 — End: 2020-04-10

## 2019-12-04 MED ORDER — EZETIMIBE 10 MG PO TABS
10.0000 mg | ORAL_TABLET | Freq: Every day | ORAL | 3 refills | Status: DC
Start: 2019-12-04 — End: 2020-04-09

## 2019-12-04 NOTE — Telephone Encounter (Signed)
Spoke with patient and reviewed provider recommendations to decrease 1/2 dose and he was agreeable with this plan. Reviewed that his Rosuvastatin (crestor) 10 mg and Ezetimibe (Zetia) 10 mg once daily would be sent in to his pharmacy. He verbalized understanding of our conversation, agreement with plan, and had no further questions at this time.

## 2019-12-04 NOTE — Telephone Encounter (Signed)
If Crestor 20 was causing cramping Would he be willing to try Crestor 10 daily with Zetia 10 Maybe lower dose would have less cramping

## 2020-01-01 ENCOUNTER — Other Ambulatory Visit: Payer: Self-pay | Admitting: Cardiovascular Disease

## 2020-01-09 ENCOUNTER — Other Ambulatory Visit: Payer: Self-pay | Admitting: Family Medicine

## 2020-01-13 DIAGNOSIS — N1832 Chronic kidney disease, stage 3b: Secondary | ICD-10-CM | POA: Diagnosis not present

## 2020-01-13 DIAGNOSIS — Z794 Long term (current) use of insulin: Secondary | ICD-10-CM | POA: Diagnosis not present

## 2020-01-13 DIAGNOSIS — E1122 Type 2 diabetes mellitus with diabetic chronic kidney disease: Secondary | ICD-10-CM | POA: Diagnosis not present

## 2020-01-15 DIAGNOSIS — Z85828 Personal history of other malignant neoplasm of skin: Secondary | ICD-10-CM | POA: Diagnosis not present

## 2020-01-15 DIAGNOSIS — D692 Other nonthrombocytopenic purpura: Secondary | ICD-10-CM | POA: Diagnosis not present

## 2020-01-15 DIAGNOSIS — L430 Hypertrophic lichen planus: Secondary | ICD-10-CM | POA: Diagnosis not present

## 2020-01-15 DIAGNOSIS — D225 Melanocytic nevi of trunk: Secondary | ICD-10-CM | POA: Diagnosis not present

## 2020-01-15 DIAGNOSIS — L570 Actinic keratosis: Secondary | ICD-10-CM | POA: Diagnosis not present

## 2020-01-15 DIAGNOSIS — D485 Neoplasm of uncertain behavior of skin: Secondary | ICD-10-CM | POA: Diagnosis not present

## 2020-01-15 DIAGNOSIS — L821 Other seborrheic keratosis: Secondary | ICD-10-CM | POA: Diagnosis not present

## 2020-01-20 DIAGNOSIS — E1129 Type 2 diabetes mellitus with other diabetic kidney complication: Secondary | ICD-10-CM | POA: Diagnosis not present

## 2020-01-20 DIAGNOSIS — E1122 Type 2 diabetes mellitus with diabetic chronic kidney disease: Secondary | ICD-10-CM | POA: Diagnosis not present

## 2020-01-20 DIAGNOSIS — E1159 Type 2 diabetes mellitus with other circulatory complications: Secondary | ICD-10-CM | POA: Diagnosis not present

## 2020-01-20 DIAGNOSIS — N1832 Chronic kidney disease, stage 3b: Secondary | ICD-10-CM | POA: Diagnosis not present

## 2020-01-20 DIAGNOSIS — I2510 Atherosclerotic heart disease of native coronary artery without angina pectoris: Secondary | ICD-10-CM | POA: Diagnosis not present

## 2020-01-24 ENCOUNTER — Telehealth: Payer: Self-pay | Admitting: Family Medicine

## 2020-01-24 NOTE — Telephone Encounter (Signed)
Left message for patient to call back and schedule Medicare Annual Wellness Visit (AWV)   This should be a virtual or telephone visit only=30 minutes.  Last AWV 02/06/15; please schedule at anytime with Denisa O'Brien-Blaney at [facility].

## 2020-02-21 ENCOUNTER — Inpatient Hospital Stay: Payer: Medicare Other | Admitting: Oncology

## 2020-02-21 ENCOUNTER — Inpatient Hospital Stay: Payer: Medicare Other

## 2020-02-24 ENCOUNTER — Telehealth: Payer: Medicare Other | Admitting: Oncology

## 2020-03-02 ENCOUNTER — Other Ambulatory Visit: Payer: Self-pay | Admitting: Cardiovascular Disease

## 2020-03-02 ENCOUNTER — Other Ambulatory Visit: Payer: Self-pay | Admitting: Family Medicine

## 2020-03-02 NOTE — Telephone Encounter (Signed)
Rx request sent to pharmacy.  

## 2020-03-10 ENCOUNTER — Other Ambulatory Visit: Payer: Self-pay

## 2020-03-11 ENCOUNTER — Ambulatory Visit (INDEPENDENT_AMBULATORY_CARE_PROVIDER_SITE_OTHER): Payer: Medicare Other | Admitting: Family Medicine

## 2020-03-11 ENCOUNTER — Other Ambulatory Visit: Payer: Self-pay

## 2020-03-11 ENCOUNTER — Encounter: Payer: Self-pay | Admitting: Family Medicine

## 2020-03-11 VITALS — BP 130/62 | HR 60 | Temp 97.7°F | Ht 72.0 in | Wt 218.0 lb

## 2020-03-11 DIAGNOSIS — E782 Mixed hyperlipidemia: Secondary | ICD-10-CM | POA: Diagnosis not present

## 2020-03-11 DIAGNOSIS — N400 Enlarged prostate without lower urinary tract symptoms: Secondary | ICD-10-CM | POA: Diagnosis not present

## 2020-03-11 DIAGNOSIS — B372 Candidiasis of skin and nail: Secondary | ICD-10-CM | POA: Diagnosis not present

## 2020-03-11 DIAGNOSIS — E118 Type 2 diabetes mellitus with unspecified complications: Secondary | ICD-10-CM | POA: Diagnosis not present

## 2020-03-11 DIAGNOSIS — I10 Essential (primary) hypertension: Secondary | ICD-10-CM | POA: Diagnosis not present

## 2020-03-11 DIAGNOSIS — N509 Disorder of male genital organs, unspecified: Secondary | ICD-10-CM

## 2020-03-11 MED ORDER — NYSTATIN 100000 UNIT/GM EX OINT: 1.0000 "application " | TOPICAL_OINTMENT | Freq: Two times a day (BID) | CUTANEOUS | 0 refills | Status: DC

## 2020-03-11 MED ORDER — FINASTERIDE 5 MG PO TABS: 5.0000 mg | ORAL_TABLET | Freq: Every day | ORAL | 1 refills | Status: AC

## 2020-03-11 NOTE — Patient Instructions (Signed)
Nice to see you. Please contact your urologist to have them look at the spot on your scrotum. You can try the nystatin. I have refilled your finasteride. We will contact you with your cholesterol panel.

## 2020-03-11 NOTE — Progress Notes (Signed)
[name] Rumps, MD Phone: [phone]  [name] [name] is a 87 y.o. male who presents today for follow-up.  Hypertension: Not checking blood pressures.  Taking amlodipine, carvedilol, ramipril.  No chest pain.  No changes to chronic shortness of breath.  Mild chronic lower extremity edema left slightly greater than right.  Diabetes: Typically running around 160s.  He is taking 49 units of Toujeo.  He is also on Metformin 250 mg twice daily.  He is doing it in that manner given GI upset with 500 mg once daily.  No polyuria or polydipsia.  Rarely feels as though his blood sugar drops and he will use a glucose tablet and orange juice to resolve the symptoms.  BPH: Taking finasteride.  No urinary frequency, urgency.  Does have to strain some.  He does feel like he empties his bladder.  Does note slow flow.  Nocturia x1 per night.    Arthritis: This bothers him in his hands.  He wonders what he can take for this.  Candidal intertrigo: Patient notes this has been occurring in his groin intermittently.  This gets red and inflamed at times.  Occurs bilaterally.  He uses over-the-counter topical treatments on it with some benefit.  Scrotal lesion: Patient notes he has had this for decades and it has not changed.  He is unsure if anybody has ever looked at it.  There is no associated pain.  Social History   Tobacco Use  Smoking Status Never Smoker  Smokeless Tobacco Never Used    Current Outpatient Medications on File Prior to Visit  Medication Sig Dispense Refill  . acetaminophen (TYLENOL) 500 MG tablet Take 1,000 mg by mouth 2 (two) times daily.    Marland Kitchen amLODipine (NORVASC) 5 MG tablet Take 1 tablet (5 mg total) by mouth daily. 90 tablet 3  . aspirin EC 81 MG tablet Take 81 mg by mouth daily.    . BD INSULIN SYRINGE ULTRAFINE 31G X 15/64" 0.3 ML MISC USE AS DIRECTED 100 each 3  . BD PEN NEEDLE NANO U/F 32G X 4 MM MISC USE AS DIRECTED 100 each 11  . blood glucose meter kit and supplies KIT  OneTouch Verio w/ Device Kit OneTouch Verio VI STRP OneTouch Delica Lancet Dev misc OneTouch Delica Lancets 29J or Fine  Please check CBGs fasting and 2 other times during the day  Dx Code E11.8 1 each 0  . carvedilol (COREG) 12.5 MG tablet TAKE ONE TABLET BY MOUTH TWICE DAILY 180 tablet 0  . esomeprazole (NEXIUM) 20 MG capsule TAKE 1 CAPSULE BY MOUTH ONCE DAILY 30 MINUTES BEFORE BREAKFAST. 30 capsule 1  . ezetimibe (ZETIA) 10 MG tablet Take 1 tablet (10 mg total) by mouth daily. 90 tablet 3  . furosemide (LASIX) 20 MG tablet TAKE 1 TABLET BY MOUTH DAILY 90 tablet 3  . gabapentin (NEURONTIN) 100 MG capsule TAKE 1 CAPSULE BY MOUTH 3 TIMES DAILY 90 capsule 3  . metFORMIN (GLUCOPHAGE-XR) 500 MG 24 hr tablet Take by mouth.    . ondansetron (ZOFRAN) 4 MG tablet Take 1 tablet (4 mg total) by mouth every 8 (eight) hours as needed for nausea or vomiting. 10 tablet 0  . potassium chloride (KLOR-CON) 10 MEQ tablet Take 2 tablets (20 mEq total) by mouth once for 1 dose. Take with fluid pill 180 tablet 3  . ramipril (ALTACE) 10 MG capsule Take 1 capsule (10 mg total) by mouth 2 (two) times daily. 180 capsule 4  . rosuvastatin (CRESTOR) 10 MG  tablet Take 1 tablet (10 mg total) by mouth daily. 90 tablet 3  . TOUJEO SOLOSTAR 300 UNIT/ML Solostar Pen INJECT 50 UNITS DAILY MAY INCREASE 2U EVERY 3 DAYS UNTIL FBG IS AT GOAL UP TO 80 UNITS DAILY 22.5 mL 6  . Vitamin D, Cholecalciferol, 1000 units TABS Take 1,000 Units by mouth daily.     Marland Kitchen zolpidem (AMBIEN) 5 MG tablet Take 1 tablet (5 mg total) by mouth at bedtime as needed for sleep. Stop trazadone not helpful 30 tablet 0   No current facility-administered medications on file prior to visit.     ROS see history of present illness  Objective  Physical Exam Vitals:   03/11/20 1439  BP: 130/62  Pulse: 60  Temp: 97.7 F (36.5 C)  SpO2: 99%    BP Readings from Last 3 Encounters:  03/11/20 130/62  10/21/19 (!) 110/54  05/10/19 120/80   Wt  Readings from Last 3 Encounters:  03/11/20 218 lb (98.9 kg)  10/21/19 211 lb 8 oz (95.9 kg)  05/07/19 211 lb 9.6 oz (96 kg)    Physical Exam Constitutional:      General: He is not in acute distress.    Appearance: He is not diaphoretic.  Cardiovascular:     Rate and Rhythm: Normal rate and regular rhythm.     Heart sounds: Normal heart sounds.  Pulmonary:     Effort: Pulmonary effort is normal.     Breath sounds: Normal breath sounds.  Genitourinary:   Musculoskeletal:        General: No edema.  Skin:    General: Skin is warm and dry.  Neurological:     Mental Status: He is alert.      Assessment/Plan: Please see individual problem list.  Problem List Items Addressed This Visit    BPH (benign prostatic hyperplasia)    The patient will remain on finasteride 5 mg once daily.  Refill given.      Relevant Medications   finasteride (PROSCAR) 5 MG tablet   Candidal intertrigo    Trial of nystatin cream.  If not improving he will let us know.      Relevant Medications   nystatin ointment (MYCOSTATIN)   Essential hypertension    Adequate control.  He will continue amlodipine 5 mg once daily, carvedilol 12.5 mg twice daily, and ramipril 10 mg daily.      Hyperlipidemia - Primary   Relevant Orders   Lipid panel   Scrotal lesion    Likely benign given that its been present for decades and has not changed though I did recommend that he contact his urologist or his dermatologist to review this area.      Type 2 diabetes mellitus with complication, without long-term current use of insulin ([place])    Followed by endocrinology.  He will continue Metformin 250 mg twice daily and Toujeo 49 units daily.  If he has GI issues with the immediate release Metformin he could be switched to extended release Metformin.      Relevant Medications   metFORMIN (GLUCOPHAGE-XR) 500 MG 24 hr tablet      This visit occurred during the SARS-CoV-2 public health emergency.  Safety protocols  were in place, including screening questions prior to the visit, additional usage of staff PPE, and extensive cleaning of exam room while observing appropriate contact time as indicated for disinfecting solutions.    [name] Rumps, MD [place]

## 2020-03-12 DIAGNOSIS — N509 Disorder of male genital organs, unspecified: Secondary | ICD-10-CM | POA: Insufficient documentation

## 2020-03-12 DIAGNOSIS — B372 Candidiasis of skin and nail: Secondary | ICD-10-CM | POA: Insufficient documentation

## 2020-03-12 LAB — LIPID PANEL
Cholesterol: 93 mg/dL (ref 0–200)
HDL: 37.6 mg/dL — ABNORMAL LOW (ref >39.00)
LDL Cholesterol: 23 mg/dL (ref 0–99)
NonHDL: 55.27
Total CHOL/HDL Ratio: 2
Triglycerides: 163 mg/dL — ABNORMAL HIGH (ref 0.0–149.0)
VLDL: 32.6 mg/dL (ref 0.0–40.0)

## 2020-03-12 NOTE — Assessment & Plan Note (Signed)
Likely benign given that its been present for decades and has not changed though I did recommend that he contact his urologist or his dermatologist to review this area.

## 2020-03-12 NOTE — Assessment & Plan Note (Signed)
Followed by endocrinology.  He will continue Metformin 250 mg twice daily and Toujeo 49 units daily.  If he has GI issues with the immediate release Metformin he could be switched to extended release Metformin.

## 2020-03-12 NOTE — Assessment & Plan Note (Signed)
Trial of nystatin cream.  If not improving he will let us know.

## 2020-03-12 NOTE — Assessment & Plan Note (Signed)
Adequate control.  He will continue amlodipine 5 mg once daily, carvedilol 12.5 mg twice daily, and ramipril 10 mg daily.

## 2020-03-12 NOTE — Assessment & Plan Note (Signed)
The patient will remain on finasteride 5 mg once daily.  Refill given.

## 2020-04-01 ENCOUNTER — Other Ambulatory Visit: Payer: Self-pay | Admitting: Cardiovascular Disease

## 2020-04-03 ENCOUNTER — Other Ambulatory Visit: Payer: Self-pay | Admitting: Cardiovascular Disease

## 2020-04-03 ENCOUNTER — Telehealth: Payer: Self-pay | Admitting: Family Medicine

## 2020-04-03 NOTE — Telephone Encounter (Signed)
Patient would like to change his esomeprazole (NEXIUM) 20 MG capsule, the medication is too expensive and would like something else to replace this medication. His pharmacy is [facility].

## 2020-04-03 NOTE — Telephone Encounter (Signed)
Rx request sent to pharmacy.  

## 2020-04-03 NOTE — Telephone Encounter (Signed)
Patient would like to change his esomeprazole (NEXIUM) 20 MG capsule, the medication is too expensive and would like something else to replace this medication. His pharmacy is [facility]  Chun Sellen,cma

## 2020-04-06 ENCOUNTER — Telehealth (INDEPENDENT_AMBULATORY_CARE_PROVIDER_SITE_OTHER): Payer: Medicare Other | Admitting: Internal Medicine

## 2020-04-06 ENCOUNTER — Encounter: Payer: Self-pay | Admitting: Internal Medicine

## 2020-04-06 ENCOUNTER — Other Ambulatory Visit: Payer: Medicare Other

## 2020-04-06 VITALS — Ht 72.01 in | Wt 213.0 lb

## 2020-04-06 DIAGNOSIS — R1013 Epigastric pain: Secondary | ICD-10-CM

## 2020-04-06 DIAGNOSIS — Z20822 Contact with and (suspected) exposure to covid-19: Secondary | ICD-10-CM | POA: Insufficient documentation

## 2020-04-06 MED ORDER — PANTOPRAZOLE SODIUM 40 MG PO TBEC: 40.0000 mg | DELAYED_RELEASE_TABLET | Freq: Every day | ORAL | 3 refills | Status: DC

## 2020-04-06 MED ORDER — ONDANSETRON 4 MG PO TBDP: 4.0000 mg | ORAL_TABLET | Freq: Three times a day (TID) | ORAL | 0 refills | Status: DC | PRN

## 2020-04-06 NOTE — Telephone Encounter (Signed)
The patient completed a visit with Dr. Derrel Nip today.  Protonix was sent in by her.

## 2020-04-06 NOTE — Progress Notes (Signed)
Virtual Visit converted to Telephone  Note  This visit type was conducted due to national recommendations for restrictions regarding the COVID-19 pandemic (e.g. social distancing).  This format is felt to be most appropriate for this patient at this time.  All issues noted in this document were discussed and addressed.  No physical exam was performed (except for noted visual exam findings with Video Visits).   I attempted to connect with@ on 04/06/20 at 10:00 AM EST by a video enabled telemedicine application  and verified that I am speaking with the correct person using two identifiers. Location patient: home Location provider: work or home office Persons participating in the virtual visit: patient, provider  I discussed the limitations, risks, security and privacy concerns of performing an evaluation and management service by telephone and the availability of in person appointments. I also discussed with the patient that there may be a patient responsible charge related to this service. The patient expressed understanding and agreed to proceed.  Interactive audio and video telecommunications were attempted between this provider and patient, however failed, due to patient having technical difficulties   We continued and completed visit with audio only.   Reason for visit: Suspected COVID infection   HPI:  87 yr old male WITH history of CAD s/p 4 vessel CABG , normal EF by 2019 ECHO,  Type 2 DM,  OSA and CKD presents with fever to 101.5 acc'd by nausea, body aches .  Symptoms of nausea started yesterday while attending church services.  Never vomited,  But felt nauseated and fatigued all day,  Joints started aching last night,  Lower back,  Wrist,  And woke up at 3 am with fver of 101.5  .  No dysuria, frequency  Diarrhea,  No shortness of breath,  Sinus congestion .  Has had an intermittent dry cough that has been infrequent for the last 2 weeks  Of 24 hours duration  He is fully vaccinated with  booster against COVID 19   ROS: See pertinent positives and negatives per HPI.  Past Medical History:  Diagnosis Date  . BPH (benign prostatic hyperplasia)   . Cancer ([place])    skin  . Cataract   . Coronary artery disease    a. 4v CABG 01/2005; b. cath 06/05/13 showed patent grafts: LIMA-LAD, VG-D, VG-OM3, VG-dRCA, nl filling pressures, nl LV fxn  . Diastolic dysfunction    a. echo 09/4257: nl systolic fxn, mild LVH, diastolic relaxation abnormality, mildly enlarged LA, mild Ao insufficiency  . Dyspnea    with exertion  . ED (erectile dysfunction)   . GERD (gastroesophageal reflux disease)   . Hyperlipidemia   . Hypertension   . IDDM (insulin dependent diabetes mellitus) 2000  . Osteoarthritis   . Perirectal fistula   . Pneumonia 12/2016  . Shingles 09/04/2018  . Tubular adenoma of colon 07/2012  . Vertigo     Past Surgical History:  Procedure Laterality Date  . BACK SURGERY    . CARDIAC CATHETERIZATION  06/05/2013   [facility].   Marland Kitchen CARDIAC CATHETERIZATION N/A 09/24/2015   Procedure: Right Heart Cath and Coronary/Graft Angiography;  Surgeon: Minna Merritts, MD;  Location: [facility];  Service: Cardiovascular;  Laterality: N/A;  . CATARACT EXTRACTION  sept 2013   right  . CATARACT EXTRACTION W/PHACO Left 03/28/2017   Procedure: CATARACT EXTRACTION PHACO AND INTRAOCULAR LENS PLACEMENT (IOC);  Surgeon: Birder Robson, MD;  Location: [facility];  Service: Ophthalmology;  Laterality: Left;  [place] 00:42.0  AP% 15.0 CDE 6.31 Fluid Pack lot # [id number]  . CHOLECYSTECTOMY  05/15/2011   Procedure: LAPAROSCOPIC CHOLECYSTECTOMY;  Surgeon: Rolm Bookbinder, MD;  Location: [place];  Service: General;  Laterality: N/A;  . COLONOSCOPY W/ POLYPECTOMY    . CORONARY ARTERY BYPASS GRAFT  2007   x 4  . EYE SURGERY    . FOOT SURGERY  2012   right foot  . JOINT REPLACEMENT  8/10   Right THR--[name]  . LEFT AND RIGHT HEART CATHETERIZATION WITH CORONARY/GRAFT ANGIOGRAM N/A 06/05/2013    Procedure: LEFT AND RIGHT HEART CATHETERIZATION WITH Beatrix Fetters;  Surgeon: Peter M Martinique, MD;  Location: [facility];  Service: Cardiovascular;  Laterality: N/A;  . LUMBAR LAMINECTOMY  1989  . Prostate photovaporization  5/16   Dr Budd Palmer  . RIGHT/LEFT HEART CATH AND CORONARY/GRAFT ANGIOGRAPHY N/A 01/05/2018   Procedure: RIGHT/LEFT HEART CATH AND CORONARY/GRAFT ANGIOGRAPHY;  Surgeon: Minna Merritts, MD;  Location: [facility];  Service: Cardiovascular;  Laterality: N/A;    Family History  Problem Relation Age of Onset  . Lung cancer Mother   . Heart disease Father   . Colon cancer Neg Hx   . Breast cancer Neg Hx     SOCIAL HX:  reports that he has never smoked. He has never used smokeless tobacco. He reports current alcohol use. He reports that he does not use drugs.   Current Outpatient Medications:  .  acetaminophen (TYLENOL) 500 MG tablet, Take 1,000 mg by mouth 2 (two) times daily., Disp: , Rfl:  .  amLODipine (NORVASC) 5 MG tablet, Take 1 tablet (5 mg total) by mouth daily., Disp: 90 tablet, Rfl: 3 .  aspirin EC 81 MG tablet, Take 81 mg by mouth daily., Disp: , Rfl:  .  BD INSULIN SYRINGE ULTRAFINE 31G X 15/64" 0.3 ML MISC, USE AS DIRECTED, Disp: 100 each, Rfl: 3 .  BD PEN NEEDLE NANO U/F 32G X 4 MM MISC, USE AS DIRECTED, Disp: 100 each, Rfl: 11 .  blood glucose meter kit and supplies KIT, OneTouch Verio w/ Device Kit OneTouch Verio VI STRP OneTouch Delica Lancet Dev misc OneTouch Delica Lancets 42A or Fine  Please check CBGs fasting and 2 other times during the day  Dx Code E11.8, Disp: 1 each, Rfl: 0 .  carvedilol (COREG) 12.5 MG tablet, TAKE ONE (1) TABLET BY MOUTH TWO TIMES PER DAY, Disp: 180 tablet, Rfl: 0 .  finasteride (PROSCAR) 5 MG tablet, Take 1 tablet (5 mg total) by mouth daily., Disp: 90 tablet, Rfl: 1 .  furosemide (LASIX) 20 MG tablet, TAKE 1 TABLET BY MOUTH DAILY, Disp: 90 tablet, Rfl: 3 .  gabapentin (NEURONTIN) 100 MG capsule, TAKE 1  CAPSULE BY MOUTH 3 TIMES DAILY, Disp: 90 capsule, Rfl: 3 .  metFORMIN (GLUCOPHAGE-XR) 500 MG 24 hr tablet, Take by mouth., Disp: , Rfl:  .  nystatin ointment (MYCOSTATIN), Apply 1 application topically 2 (two) times daily., Disp: 30 g, Rfl: 0 .  ondansetron (ZOFRAN ODT) 4 MG disintegrating tablet, Take 1 tablet (4 mg total) by mouth every 8 (eight) hours as needed for nausea or vomiting., Disp: 20 tablet, Rfl: 0 .  ondansetron (ZOFRAN) 4 MG tablet, Take 1 tablet (4 mg total) by mouth every 8 (eight) hours as needed for nausea or vomiting., Disp: 10 tablet, Rfl: 0 .  pantoprazole (PROTONIX) 40 MG tablet, Take 1 tablet (40 mg total) by mouth daily., Disp: 30 tablet, Rfl: 3 .  ramipril (ALTACE) 10 MG  capsule, TAKE 1 CAPSULE BY MOUTH TWICE DAILY, Disp: 180 capsule, Rfl: 4 .  TOUJEO SOLOSTAR 300 UNIT/ML Solostar Pen, INJECT 50 UNITS DAILY MAY INCREASE 2U EVERY 3 DAYS UNTIL FBG IS AT GOAL UP TO 80 UNITS DAILY, Disp: 22.5 mL, Rfl: 6 .  Vitamin D, Cholecalciferol, 1000 units TABS, Take 1,000 Units by mouth daily. , Disp: , Rfl:  .  zolpidem (AMBIEN) 5 MG tablet, Take 1 tablet (5 mg total) by mouth at bedtime as needed for sleep. Stop trazadone not helpful, Disp: 30 tablet, Rfl: 0 .  ezetimibe (ZETIA) 10 MG tablet, Take 1 tablet (10 mg total) by mouth daily., Disp: 90 tablet, Rfl: 3 .  potassium chloride (KLOR-CON) 10 MEQ tablet, Take 2 tablets (20 mEq total) by mouth once for 1 dose. Take with fluid pill, Disp: 180 tablet, Rfl: 3 .  rosuvastatin (CRESTOR) 10 MG tablet, Take 1 tablet (10 mg total) by mouth daily., Disp: 90 tablet, Rfl: 3  EXAM:   General impression: alert, cooperative and articulate.  No signs of being in distress  Lungs: speech is fluent sentence length suggests that patient is not short of breath and not punctuated by cough, sneezing or sniffing. Marland Kitchen   Psych: affect normal.  speech is articulate and non pressured .  Denies suicidal thoughts    ASSESSMENT AND PLAN:  Discussed the  following assessment and plan:  Suspected COVID-19 virus infection - Plan: POCT Influenza A/B, Novel Coronavirus, NAA ([facility])  Epigastric pain  Suspected COVID-19 virus infection Symptoms are currently mild and limited  to fevers, nausea and body aches of less than 24 hours duration.  will test for influenza and COVID.  [facility]dt [facility]  Advised to isolate until results are known, and notify office if other symptoms develop  Epigastric pain Attributed to gastritis.  Alternative to nexium due to cost requested,  protonix sent    I discussed the assessment and treatment plan with the patient. The patient was provided an opportunity to ask questions and all were answered. The patient agreed with the plan and demonstrated an understanding of the instructions.   The patient was advised to call back or seek an in-person evaluation if the symptoms worsen or if the condition fails to improve as anticipated.  Video connection was lost at < 50% of the duration of the visit ,  At which time the remainder of the visit was completed via audio only.   I provided  25 minutes of non-face-to-face time during this encounter reviewing patient's current problems and post surgeries.  Providing counseling on the above mentioned problems , and coordination  of care .  [name] Mc, MD

## 2020-04-06 NOTE — Assessment & Plan Note (Signed)
Attributed to gastritis.  Alternative to nexium due to cost requested,  protonix sent

## 2020-04-06 NOTE — Assessment & Plan Note (Addendum)
Symptoms are currently mild and limited  to fevers, nausea and body aches of less than 24 hours duration.  will test for influenza and COVID.  [facility]dt [facility].  Advised to isolate until results are known, and notify office if other symptoms develop

## 2020-04-07 ENCOUNTER — Other Ambulatory Visit: Payer: Self-pay | Admitting: Family Medicine

## 2020-04-08 ENCOUNTER — Encounter: Payer: Self-pay | Admitting: Internal Medicine

## 2020-04-08 ENCOUNTER — Inpatient Hospital Stay
Admission: EM | Admit: 2020-04-08 | Discharge: 2020-04-10 | DRG: 682 | Disposition: A | Discharge status: Home / Self Care | Payer: Medicare Other | Attending: Internal Medicine | Admitting: Internal Medicine

## 2020-04-08 ENCOUNTER — Inpatient Hospital Stay: Payer: Medicare Other

## 2020-04-08 ENCOUNTER — Emergency Department: Payer: Medicare Other

## 2020-04-08 ENCOUNTER — Telehealth: Payer: Self-pay | Admitting: Internal Medicine

## 2020-04-08 ENCOUNTER — Other Ambulatory Visit: Payer: Self-pay

## 2020-04-08 ENCOUNTER — Other Ambulatory Visit
Admission: RE | Admit: 2020-04-08 | Discharge: 2020-04-08 | Disposition: A | Discharge status: Home / Self Care | Payer: Medicare Other | Source: Home / Self Care | Attending: Internal Medicine | Admitting: Internal Medicine

## 2020-04-08 ENCOUNTER — Telehealth: Payer: Self-pay | Admitting: Family Medicine

## 2020-04-08 DIAGNOSIS — N179 Acute kidney failure, unspecified: Principal | ICD-10-CM

## 2020-04-08 DIAGNOSIS — R630 Anorexia: Secondary | ICD-10-CM

## 2020-04-08 DIAGNOSIS — E8809 Other disorders of plasma-protein metabolism, not elsewhere classified: Secondary | ICD-10-CM | POA: Diagnosis not present

## 2020-04-08 DIAGNOSIS — D696 Thrombocytopenia, unspecified: Secondary | ICD-10-CM | POA: Diagnosis not present

## 2020-04-08 DIAGNOSIS — R7989 Other specified abnormal findings of blood chemistry: Secondary | ICD-10-CM | POA: Diagnosis not present

## 2020-04-08 DIAGNOSIS — I129 Hypertensive chronic kidney disease with stage 1 through stage 4 chronic kidney disease, or unspecified chronic kidney disease: Secondary | ICD-10-CM | POA: Diagnosis not present

## 2020-04-08 DIAGNOSIS — I2510 Atherosclerotic heart disease of native coronary artery without angina pectoris: Secondary | ICD-10-CM | POA: Diagnosis not present

## 2020-04-08 DIAGNOSIS — E1165 Type 2 diabetes mellitus with hyperglycemia: Secondary | ICD-10-CM | POA: Diagnosis not present

## 2020-04-08 DIAGNOSIS — Z951 Presence of aortocoronary bypass graft: Secondary | ICD-10-CM | POA: Diagnosis not present

## 2020-04-08 DIAGNOSIS — M1990 Unspecified osteoarthritis, unspecified site: Secondary | ICD-10-CM | POA: Diagnosis not present

## 2020-04-08 DIAGNOSIS — Z8249 Family history of ischemic heart disease and other diseases of the circulatory system: Secondary | ICD-10-CM

## 2020-04-08 DIAGNOSIS — R7401 Elevation of levels of liver transaminase levels: Secondary | ICD-10-CM

## 2020-04-08 DIAGNOSIS — Z20822 Contact with and (suspected) exposure to covid-19: Secondary | ICD-10-CM | POA: Diagnosis present

## 2020-04-08 DIAGNOSIS — R17 Unspecified jaundice: Secondary | ICD-10-CM

## 2020-04-08 DIAGNOSIS — R1011 Right upper quadrant pain: Secondary | ICD-10-CM

## 2020-04-08 DIAGNOSIS — E860 Dehydration: Secondary | ICD-10-CM | POA: Diagnosis present

## 2020-04-08 DIAGNOSIS — K5900 Constipation, unspecified: Secondary | ICD-10-CM | POA: Diagnosis not present

## 2020-04-08 DIAGNOSIS — Z9049 Acquired absence of other specified parts of digestive tract: Secondary | ICD-10-CM | POA: Diagnosis not present

## 2020-04-08 DIAGNOSIS — K219 Gastro-esophageal reflux disease without esophagitis: Secondary | ICD-10-CM | POA: Diagnosis present

## 2020-04-08 DIAGNOSIS — Z888 Allergy status to other drugs, medicaments and biological substances status: Secondary | ICD-10-CM

## 2020-04-08 DIAGNOSIS — K760 Fatty (change of) liver, not elsewhere classified: Secondary | ICD-10-CM | POA: Diagnosis not present

## 2020-04-08 DIAGNOSIS — E871 Hypo-osmolality and hyponatremia: Secondary | ICD-10-CM | POA: Diagnosis present

## 2020-04-08 DIAGNOSIS — R112 Nausea with vomiting, unspecified: Secondary | ICD-10-CM | POA: Diagnosis not present

## 2020-04-08 DIAGNOSIS — R6881 Early satiety: Secondary | ICD-10-CM | POA: Diagnosis not present

## 2020-04-08 DIAGNOSIS — M545 Low back pain, unspecified: Secondary | ICD-10-CM

## 2020-04-08 DIAGNOSIS — K831 Obstruction of bile duct: Secondary | ICD-10-CM | POA: Diagnosis not present

## 2020-04-08 DIAGNOSIS — R109 Unspecified abdominal pain: Secondary | ICD-10-CM

## 2020-04-08 DIAGNOSIS — R34 Anuria and oliguria: Secondary | ICD-10-CM

## 2020-04-08 DIAGNOSIS — R161 Splenomegaly, not elsewhere classified: Secondary | ICD-10-CM | POA: Diagnosis not present

## 2020-04-08 DIAGNOSIS — N400 Enlarged prostate without lower urinary tract symptoms: Secondary | ICD-10-CM | POA: Diagnosis present

## 2020-04-08 DIAGNOSIS — Z7982 Long term (current) use of aspirin: Secondary | ICD-10-CM

## 2020-04-08 DIAGNOSIS — Z961 Presence of intraocular lens: Secondary | ICD-10-CM | POA: Diagnosis present

## 2020-04-08 DIAGNOSIS — N1832 Chronic kidney disease, stage 3b: Secondary | ICD-10-CM | POA: Diagnosis not present

## 2020-04-08 DIAGNOSIS — R945 Abnormal results of liver function studies: Secondary | ICD-10-CM | POA: Diagnosis not present

## 2020-04-08 DIAGNOSIS — E785 Hyperlipidemia, unspecified: Secondary | ICD-10-CM | POA: Diagnosis present

## 2020-04-08 DIAGNOSIS — Z79899 Other long term (current) drug therapy: Secondary | ICD-10-CM

## 2020-04-08 DIAGNOSIS — Z794 Long term (current) use of insulin: Secondary | ICD-10-CM

## 2020-04-08 DIAGNOSIS — R1084 Generalized abdominal pain: Secondary | ICD-10-CM | POA: Diagnosis not present

## 2020-04-08 DIAGNOSIS — I700 Atherosclerosis of aorta: Secondary | ICD-10-CM | POA: Diagnosis not present

## 2020-04-08 DIAGNOSIS — E1142 Type 2 diabetes mellitus with diabetic polyneuropathy: Secondary | ICD-10-CM | POA: Diagnosis present

## 2020-04-08 DIAGNOSIS — E1122 Type 2 diabetes mellitus with diabetic chronic kidney disease: Secondary | ICD-10-CM | POA: Diagnosis not present

## 2020-04-08 DIAGNOSIS — Z9842 Cataract extraction status, left eye: Secondary | ICD-10-CM

## 2020-04-08 DIAGNOSIS — Z9841 Cataract extraction status, right eye: Secondary | ICD-10-CM

## 2020-04-08 DIAGNOSIS — Z96641 Presence of right artificial hip joint: Secondary | ICD-10-CM | POA: Diagnosis present

## 2020-04-08 DIAGNOSIS — N281 Cyst of kidney, acquired: Secondary | ICD-10-CM | POA: Diagnosis not present

## 2020-04-08 DIAGNOSIS — I10 Essential (primary) hypertension: Secondary | ICD-10-CM | POA: Diagnosis not present

## 2020-04-08 LAB — HEPATITIS PANEL, ACUTE
HCV Ab: NONREACTIVE
Hep A IgM: NONREACTIVE
Hep B C IgM: NONREACTIVE
Hepatitis B Surface Ag: NONREACTIVE

## 2020-04-08 LAB — URINALYSIS, COMPLETE (UACMP) WITH MICROSCOPIC
Bilirubin Urine: NEGATIVE
Glucose, UA: 150 mg/dL — AB
Hgb urine dipstick: NEGATIVE
Ketones, ur: NEGATIVE mg/dL
Leukocytes,Ua: NEGATIVE
Nitrite: NEGATIVE
Protein, ur: NEGATIVE mg/dL
Specific Gravity, Urine: 1.012 (ref 1.005–1.030)
pH: 5 (ref 5.0–8.0)

## 2020-04-08 LAB — SARS CORONAVIRUS 2 BY RT PCR (HOSPITAL ORDER, PERFORMED IN ~~LOC~~ HOSPITAL LAB): SARS Coronavirus 2: NEGATIVE

## 2020-04-08 LAB — URINALYSIS, ROUTINE W REFLEX MICROSCOPIC
Glucose, UA: 150 mg/dL — AB
Hgb urine dipstick: NEGATIVE
Ketones, ur: NEGATIVE mg/dL
Leukocytes,Ua: NEGATIVE
Nitrite: NEGATIVE
Protein, ur: 30 mg/dL — AB
Specific Gravity, Urine: 1.018 (ref 1.005–1.030)
pH: 5 (ref 5.0–8.0)

## 2020-04-08 LAB — CBC WITH DIFFERENTIAL/PLATELET
Abs Immature Granulocytes: 0.03 10*3/uL (ref 0.00–0.07)
Basophils Absolute: 0 10*3/uL (ref 0.0–0.1)
Basophils Relative: 0 %
Eosinophils Absolute: 0.1 10*3/uL (ref 0.0–0.5)
Eosinophils Relative: 2 %
HCT: 36.5 % — ABNORMAL LOW (ref 39.0–52.0)
Hemoglobin: 13 g/dL (ref 13.0–17.0)
Immature Granulocytes: 1 %
Lymphocytes Relative: 8 %
Lymphs Abs: 0.6 10*3/uL — ABNORMAL LOW (ref 0.7–4.0)
MCH: 32.3 pg (ref 26.0–34.0)
MCHC: 35.6 g/dL (ref 30.0–36.0)
MCV: 90.6 fL (ref 80.0–100.0)
Monocytes Absolute: 0.8 10*3/uL (ref 0.1–1.0)
Monocytes Relative: 12 %
Neutro Abs: 5.1 10*3/uL (ref 1.7–7.7)
Neutrophils Relative %: 77 %
Platelets: 82 10*3/uL — ABNORMAL LOW (ref 150–400)
RBC: 4.03 MIL/uL — ABNORMAL LOW (ref 4.22–5.81)
RDW: 12.6 % (ref 11.5–15.5)
WBC: 6.6 10*3/uL (ref 4.0–10.5)
nRBC: 0 % (ref 0.0–0.2)

## 2020-04-08 LAB — COMPREHENSIVE METABOLIC PANEL
ALT: 100 U/L — ABNORMAL HIGH (ref 0–44)
ALT: 99 U/L — ABNORMAL HIGH (ref 0–44)
AST: 53 U/L — ABNORMAL HIGH (ref 15–41)
AST: 54 U/L — ABNORMAL HIGH (ref 15–41)
Albumin: 3.3 g/dL — ABNORMAL LOW (ref 3.5–5.0)
Albumin: 3.3 g/dL — ABNORMAL LOW (ref 3.5–5.0)
Alkaline Phosphatase: 226 U/L — ABNORMAL HIGH (ref 38–126)
Alkaline Phosphatase: 227 U/L — ABNORMAL HIGH (ref 38–126)
Anion gap: 9 (ref 5–15)
Anion gap: 10 (ref 5–15)
BUN: 33 mg/dL — ABNORMAL HIGH (ref 8–23)
BUN: 32 mg/dL — ABNORMAL HIGH (ref 8–23)
CO2: 23 mmol/L (ref 22–32)
CO2: 21 mmol/L — ABNORMAL LOW (ref 22–32)
Calcium: 8.5 mg/dL — ABNORMAL LOW (ref 8.9–10.3)
Calcium: 8.6 mg/dL — ABNORMAL LOW (ref 8.9–10.3)
Chloride: 100 mmol/L (ref 98–111)
Chloride: 102 mmol/L (ref 98–111)
Creatinine, Ser: 1.95 mg/dL — ABNORMAL HIGH (ref 0.61–1.24)
Creatinine, Ser: 1.82 mg/dL — ABNORMAL HIGH (ref 0.61–1.24)
GFR, Estimated: 33 mL/min — ABNORMAL LOW (ref >60)
GFR, Estimated: 36 mL/min — ABNORMAL LOW (ref >60)
Glucose, Bld: 371 mg/dL — ABNORMAL HIGH (ref 70–99)
Glucose, Bld: 366 mg/dL — ABNORMAL HIGH (ref 70–99)
Potassium: 4.1 mmol/L (ref 3.5–5.1)
Potassium: 4.3 mmol/L (ref 3.5–5.1)
Sodium: 132 mmol/L — ABNORMAL LOW (ref 135–145)
Sodium: 133 mmol/L — ABNORMAL LOW (ref 135–145)
Total Bilirubin: 5.1 mg/dL — ABNORMAL HIGH (ref 0.3–1.2)
Total Bilirubin: 5 mg/dL — ABNORMAL HIGH (ref 0.3–1.2)
Total Protein: 6.2 g/dL — ABNORMAL LOW (ref 6.5–8.1)
Total Protein: 6.1 g/dL — ABNORMAL LOW (ref 6.5–8.1)

## 2020-04-08 LAB — CBC
HCT: 36.4 % — ABNORMAL LOW (ref 39.0–52.0)
Hemoglobin: 13.3 g/dL (ref 13.0–17.0)
MCH: 32.6 pg (ref 26.0–34.0)
MCHC: 36.5 g/dL — ABNORMAL HIGH (ref 30.0–36.0)
MCV: 89.2 fL (ref 80.0–100.0)
Platelets: 79 10*3/uL — ABNORMAL LOW (ref 150–400)
RBC: 4.08 MIL/uL — ABNORMAL LOW (ref 4.22–5.81)
RDW: 12.8 % (ref 11.5–15.5)
WBC: 6.3 10*3/uL (ref 4.0–10.5)
nRBC: 0 % (ref 0.0–0.2)

## 2020-04-08 LAB — HEMOGLOBIN A1C
Hgb A1c MFr Bld: 7.8 % — ABNORMAL HIGH (ref 4.8–5.6)
Mean Plasma Glucose: 177.16 mg/dL

## 2020-04-08 LAB — BILIRUBIN, TOTAL: Total Bilirubin: 4.5 mg/dL — ABNORMAL HIGH (ref 0.3–1.2)

## 2020-04-08 LAB — PROTIME-INR
INR: 1 (ref 0.8–1.2)
Prothrombin Time: 12.7 s (ref 11.4–15.2)

## 2020-04-08 LAB — BILIRUBIN, DIRECT: Bilirubin, Direct: 2.9 mg/dL — ABNORMAL HIGH (ref 0.0–0.2)

## 2020-04-08 LAB — LACTATE DEHYDROGENASE: LDH: 84 U/L — ABNORMAL LOW (ref 98–192)

## 2020-04-08 LAB — ACETAMINOPHEN LEVEL: Acetaminophen (Tylenol), Serum: <10 ug/mL — ABNORMAL LOW (ref 10–30)

## 2020-04-08 LAB — GLUCOSE, CAPILLARY
Glucose-Capillary: 248 mg/dL — ABNORMAL HIGH (ref 70–99)
Glucose-Capillary: 278 mg/dL — ABNORMAL HIGH (ref 70–99)

## 2020-04-08 LAB — LIPASE, BLOOD: Lipase: 26 U/L (ref 11–51)

## 2020-04-08 MED ORDER — VITAMIN D3 25 MCG (1000 UNIT) PO TABS
1000.0000 [IU] | ORAL_TABLET | Freq: Every day | ORAL | Status: DC
  Administered 2020-04-09 – 2020-04-10 (×2): 1000 [IU] via ORAL
  Filled 2020-04-08 (×4): qty 1

## 2020-04-08 MED ORDER — ACETAMINOPHEN 650 MG RE SUPP: 650.0000 mg | Freq: Four times a day (QID) | RECTAL | Status: DC | PRN

## 2020-04-08 MED ORDER — SODIUM CHLORIDE 0.9 % IV SOLN: INTRAVENOUS | Status: DC

## 2020-04-08 MED ORDER — SODIUM CHLORIDE 0.9 % IV SOLN: Freq: Once | INTRAVENOUS | Status: AC

## 2020-04-08 MED ORDER — INSULIN GLARGINE (1 UNIT DIAL) 300 UNIT/ML ~~LOC~~ SOPN: 50.0000 [IU] | PEN_INJECTOR | Freq: Every day | SUBCUTANEOUS | Status: DC

## 2020-04-08 MED ORDER — ZOLPIDEM TARTRATE 5 MG PO TABS
5.0000 mg | ORAL_TABLET | Freq: Every evening | ORAL | Status: DC | PRN
  Administered 2020-04-09: 5 mg via ORAL
  Filled 2020-04-08: qty 1

## 2020-04-08 MED ORDER — PANTOPRAZOLE SODIUM 40 MG PO TBEC: 40.0000 mg | DELAYED_RELEASE_TABLET | Freq: Every day | ORAL | Status: DC

## 2020-04-08 MED ORDER — INSULIN GLARGINE 100 UNIT/ML ~~LOC~~ SOLN
50.0000 [IU] | Freq: Every day | SUBCUTANEOUS | Status: DC
  Administered 2020-04-09: 50 [IU] via SUBCUTANEOUS
  Filled 2020-04-08 (×2): qty 0.5

## 2020-04-08 MED ORDER — CARVEDILOL 6.25 MG PO TABS
12.5000 mg | ORAL_TABLET | Freq: Two times a day (BID) | ORAL | Status: DC
Start: 2020-04-08 — End: 2020-04-10
  Administered 2020-04-08 – 2020-04-10 (×4): 12.5 mg via ORAL
  Filled 2020-04-08 (×4): qty 2

## 2020-04-08 MED ORDER — TRAZODONE HCL 50 MG PO TABS: 25.0000 mg | ORAL_TABLET | Freq: Every evening | ORAL | Status: DC | PRN

## 2020-04-08 MED ORDER — ONDANSETRON HCL 4 MG/2ML IJ SOLN
4.0000 mg | Freq: Once | INTRAMUSCULAR | Status: AC
  Administered 2020-04-08: 4 mg via INTRAVENOUS
  Filled 2020-04-08: qty 2

## 2020-04-08 MED ORDER — INSULIN ASPART 100 UNIT/ML ~~LOC~~ SOLN
0.0000 [IU] | Freq: Three times a day (TID) | SUBCUTANEOUS | Status: DC
  Administered 2020-04-08: 5 [IU] via SUBCUTANEOUS
  Administered 2020-04-09: 3 [IU] via SUBCUTANEOUS
  Administered 2020-04-09: 1 [IU] via SUBCUTANEOUS
  Administered 2020-04-10 (×2): 3 [IU] via SUBCUTANEOUS
  Administered 2020-04-10: 7 [IU] via SUBCUTANEOUS
  Filled 2020-04-08 (×6): qty 1

## 2020-04-08 MED ORDER — ONDANSETRON 4 MG PO TBDP: 4.0000 mg | ORAL_TABLET | Freq: Three times a day (TID) | ORAL | Status: DC | PRN

## 2020-04-08 MED ORDER — ACETAMINOPHEN 325 MG PO TABS: 650.0000 mg | ORAL_TABLET | Freq: Four times a day (QID) | ORAL | Status: DC | PRN

## 2020-04-08 MED ORDER — MAGNESIUM HYDROXIDE 400 MG/5ML PO SUSP
30.0000 mL | Freq: Every day | ORAL | Status: DC | PRN
  Administered 2020-04-09: 30 mL via ORAL
  Filled 2020-04-08: qty 30

## 2020-04-08 MED ORDER — SODIUM CHLORIDE 0.9 % IV BOLUS
1000.0000 mL | Freq: Once | INTRAVENOUS | Status: AC
  Administered 2020-04-08: 1000 mL via INTRAVENOUS

## 2020-04-08 MED ORDER — ONDANSETRON HCL 4 MG/2ML IJ SOLN: 4.0000 mg | Freq: Four times a day (QID) | INTRAMUSCULAR | Status: DC | PRN

## 2020-04-08 MED ORDER — ROSUVASTATIN CALCIUM 10 MG PO TABS: 10.0000 mg | ORAL_TABLET | Freq: Every day | ORAL | Status: DC

## 2020-04-08 MED ORDER — ONDANSETRON HCL 4 MG PO TABS: 4.0000 mg | ORAL_TABLET | Freq: Four times a day (QID) | ORAL | Status: DC | PRN

## 2020-04-08 MED ORDER — GABAPENTIN 100 MG PO CAPS
100.0000 mg | ORAL_CAPSULE | Freq: Three times a day (TID) | ORAL | Status: DC
  Administered 2020-04-08 – 2020-04-10 (×5): 100 mg via ORAL
  Filled 2020-04-08 (×5): qty 1

## 2020-04-08 MED ORDER — FINASTERIDE 5 MG PO TABS
5.0000 mg | ORAL_TABLET | Freq: Every day | ORAL | Status: DC
Start: 2020-04-09 — End: 2020-04-10
  Administered 2020-04-09 – 2020-04-10 (×2): 5 mg via ORAL
  Filled 2020-04-08 (×2): qty 1

## 2020-04-08 MED ORDER — CIPROFLOXACIN HCL 500 MG PO TABS: 500.0000 mg | ORAL_TABLET | Freq: Two times a day (BID) | ORAL | 0 refills | Status: DC

## 2020-04-08 MED ORDER — PANTOPRAZOLE SODIUM 40 MG IV SOLR
40.0000 mg | Freq: Two times a day (BID) | INTRAVENOUS | Status: DC
  Administered 2020-04-08 – 2020-04-10 (×4): 40 mg via INTRAVENOUS
  Filled 2020-04-08 (×4): qty 40

## 2020-04-08 MED ORDER — AMLODIPINE BESYLATE 5 MG PO TABS
5.0000 mg | ORAL_TABLET | Freq: Every day | ORAL | Status: DC
  Administered 2020-04-08 – 2020-04-10 (×3): 5 mg via ORAL
  Filled 2020-04-08 (×3): qty 1

## 2020-04-08 MED ORDER — POTASSIUM CHLORIDE CRYS ER 10 MEQ PO TBCR
20.0000 meq | EXTENDED_RELEASE_TABLET | Freq: Once | ORAL | Status: AC
  Administered 2020-04-08: 20 meq via ORAL
  Filled 2020-04-08: qty 2

## 2020-04-08 MED ORDER — EZETIMIBE 10 MG PO TABS
10.0000 mg | ORAL_TABLET | Freq: Every day | ORAL | Status: DC
  Administered 2020-04-09 – 2020-04-10 (×2): 10 mg via ORAL
  Filled 2020-04-08 (×2): qty 1

## 2020-04-08 MED ORDER — ONDANSETRON HCL 4 MG PO TABS: 4.0000 mg | ORAL_TABLET | Freq: Three times a day (TID) | ORAL | Status: DC | PRN

## 2020-04-08 NOTE — Telephone Encounter (Signed)
Pt needs a call back. He said he has a question but before I got anyone on the phone pt hung up.

## 2020-04-08 NOTE — Telephone Encounter (Signed)
Patient is currently in [facility] ED.

## 2020-04-08 NOTE — H&P (Addendum)
[name] [name]   PATIENT NAME: [name] [name]    MR#:  [id number]  DATE OF BIRTH:  02/24/1934  DATE OF ADMISSION:  04/08/2020  PRIMARY CARE PHYSICIAN: [name], MD   REQUESTING/REFERRING PHYSICIAN: Duffy Bruce, MD CHIEF COMPLAINT:   Chief Complaint  Patient presents with  . Abnormal Lab    HISTORY OF PRESENT ILLNESS:  [name] [name]  is a 87 y.o. Caucasian male with a known history of multiple medical problems that are mentioned below including hypertension, dyslipidemia, type diabetes mellitus, coronary artery disease status post CABG, osteoarthritis and BPH, who presented to the emergency room with right upper quadrant abdominal pain with associated nausea and vomiting as well as fever with a T-max of one 1.5 for the last 3 to 4 days with associated generalized fatigue and tiredness.  His symptoms started on Sunday when he was at church.  He went to his PCP today and had labs that came back abnormal.  He has been having diminished appetite.  He denied any bilious vomitus or hematemesis.  He had diarrhea on Sunday but later constipation with no bowel movements over the last couple days.  No  melena or bright red blood per rectum.  He has been having difficulty with urinary stream but no dysuria, oliguria or hematuria or flank pain.  He has been vaccinated for COVID-19.  The patient was given p.o. Cipro on Monday by his PCP.  He stated that he still had fever after Cipro.  The patient had a negative home Covid test on Monday.  Upon presentation to the ER, vital signs revealed low blood pressure of 163/67 with otherwise normal vital signs.  Labs revealed mild hyponatremia and hyperglycemia of 371 and later 366, BUN of 33 and creatinine 1.95 later 1.82, alk phos of 226 and later 227, AST 53 and later 54, ALT 100 and later 99 total protein of 6.2 with albumin of 3.3 and total bili of 5.1 with direct bili of 2.9.  CBC showed thrombocytopenia of 82 and later 79.  UA was  unremarkable.  Abdominal pelvic CT scan revealed stable mild splenomegaly, cholecystectomy with no biliary dilatation, advanced vascular calcifications, enlarged prostate gland and aortic atherosclerosis with no acute findings.  COVID-19 PCR came back negative.  The patient was given 1 L bolus of IV normal saline and 4 mg of IV Zofran.  He will be admitted to a medical bed for further evaluation and management. PAST MEDICAL HISTORY:   Past Medical History:  Diagnosis Date  . BPH (benign prostatic hyperplasia)   . Cancer ([place])    skin  . Cataract   . Coronary artery disease    a. 4v CABG 01/2005; b. cath 06/05/13 showed patent grafts: LIMA-LAD, VG-D, VG-OM3, VG-dRCA, nl filling pressures, nl LV fxn  . Diastolic dysfunction    a. echo 06/9447: nl systolic fxn, mild LVH, diastolic relaxation abnormality, mildly enlarged LA, mild Ao insufficiency  . Dyspnea    with exertion  . ED (erectile dysfunction)   . GERD (gastroesophageal reflux disease)   . Hyperlipidemia   . Hypertension   . IDDM (insulin dependent diabetes mellitus) 2000  . Osteoarthritis   . Perirectal fistula   . Pneumonia 12/2016  . Shingles 09/04/2018  . Tubular adenoma of colon 07/2012  . Vertigo     PAST SURGICAL HISTORY:   Past Surgical History:  Procedure Laterality Date  . BACK SURGERY    . CARDIAC CATHETERIZATION  06/05/2013   [facility].   Marland Kitchen  CARDIAC CATHETERIZATION N/A 09/24/2015   Procedure: Right Heart Cath and Coronary/Graft Angiography;  Surgeon: Minna Merritts, MD;  Location: [facility];  Service: Cardiovascular;  Laterality: N/A;  . CATARACT EXTRACTION  sept 2013   right  . CATARACT EXTRACTION W/PHACO Left 03/28/2017   Procedure: CATARACT EXTRACTION PHACO AND INTRAOCULAR LENS PLACEMENT (IOC);  Surgeon: Birder Robson, MD;  Location: [facility];  Service: Ophthalmology;  Laterality: Left;  [place] 00:42.0 AP% 15.0 CDE 6.31 Fluid Pack lot # [id number]  . CHOLECYSTECTOMY  05/15/2011   Procedure:  LAPAROSCOPIC CHOLECYSTECTOMY;  Surgeon: Rolm Bookbinder, MD;  Location: [facility];  Service: General;  Laterality: N/A;  . COLONOSCOPY W/ POLYPECTOMY    . CORONARY ARTERY BYPASS GRAFT  2007   x 4  . EYE SURGERY    . FOOT SURGERY  2012   right foot  . JOINT REPLACEMENT  8/10   Right THR--[name]  . LEFT AND RIGHT HEART CATHETERIZATION WITH CORONARY/GRAFT ANGIOGRAM N/A 06/05/2013   Procedure: LEFT AND RIGHT HEART CATHETERIZATION WITH Beatrix Fetters;  Surgeon: Peter M Martinique, MD;  Location: [facility];  Service: Cardiovascular;  Laterality: N/A;  . LUMBAR LAMINECTOMY  1989  . Prostate photovaporization  5/16   Dr Budd Palmer  . RIGHT/LEFT HEART CATH AND CORONARY/GRAFT ANGIOGRAPHY N/A 01/05/2018   Procedure: RIGHT/LEFT HEART CATH AND CORONARY/GRAFT ANGIOGRAPHY;  Surgeon: Minna Merritts, MD;  Location: [facility];  Service: Cardiovascular;  Laterality: N/A;    SOCIAL HISTORY:   Social History   Tobacco Use  . Smoking status: Never Smoker  . Smokeless tobacco: Never Used  Substance Use Topics  . Alcohol use: Yes    Comment: occ cocktail    FAMILY HISTORY:   Family History  Problem Relation Age of Onset  . Lung cancer Mother   . Heart disease Father   . Colon cancer Neg Hx   . Breast cancer Neg Hx     DRUG ALLERGIES:   Allergies  Allergen Reactions  . Cortisone Other (See Comments)    Reaction: Leg Cramps  . Metformin Diarrhea  . Metformin And Related Diarrhea    REVIEW OF SYSTEMS:   ROS As per history of present illness. All pertinent systems were reviewed above. Constitutional, HEENT, cardiovascular, respiratory, GI, GU, musculoskeletal, neuro, psychiatric, endocrine, integumentary and hematologic systems were reviewed and are otherwise negative/unremarkable except for positive findings mentioned above in the HPI.   MEDICATIONS AT HOME:   Prior to Admission medications   Medication Sig Start Date End Date Taking? Authorizing Provider   acetaminophen (TYLENOL) 500 MG tablet Take 1,000 mg by mouth 2 (two) times daily.    [provider]  amLODipine (NORVASC) 5 MG tablet Take 1 tablet (5 mg total) by mouth daily. 10/21/19   Minna Merritts, MD  aspirin EC 81 MG tablet Take 81 mg by mouth daily.    [provider]  BD INSULIN SYRINGE ULTRAFINE 31G X 15/64" 0.3 ML MISC USE AS DIRECTED 07/31/15   Venia Carbon, MD  BD PEN NEEDLE NANO U/F 32G X 4 MM MISC USE AS DIRECTED 10/03/19   [name], MD  blood glucose meter kit and supplies KIT OneTouch Verio w/ Device Kit OneTouch Verio VI STRP OneTouch Delica Lancet Dev misc OneTouch Delica Lancets 01V or Fine  Please check CBGs fasting and 2 other times during the day  Dx Code E11.8 08/18/17   [name], MD  carvedilol (COREG) 12.5 MG tablet TAKE ONE (1)  TABLET BY MOUTH TWO TIMES PER DAY 04/01/20   Minna Merritts, MD  ciprofloxacin (CIPRO) 500 MG tablet Take 1 tablet (500 mg total) by mouth 2 (two) times daily. 04/08/20   Crecencio Mc, MD  ezetimibe (ZETIA) 10 MG tablet Take 1 tablet (10 mg total) by mouth daily. 12/04/19 03/03/20  Minna Merritts, MD  finasteride (PROSCAR) 5 MG tablet Take 1 tablet (5 mg total) by mouth daily. 03/11/20   [name], MD  furosemide (LASIX) 20 MG tablet TAKE 1 TABLET BY MOUTH DAILY 03/02/20   Minna Merritts, MD  gabapentin (NEURONTIN) 100 MG capsule TAKE 1 CAPSULE BY MOUTH 3 TIMES DAILY 10/14/19   [name], MD  metFORMIN (GLUCOPHAGE-XR) 500 MG 24 hr tablet Take by mouth. 01/20/20 01/19/21  [provider]  nystatin ointment (MYCOSTATIN) Apply 1 application topically 2 (two) times daily. 03/11/20   [name], MD  omeprazole (PRILOSEC) 20 MG capsule TAKE 1 CAPSULE BY MOUTH TWICE DAILY 04/07/20   [name], MD  ondansetron (ZOFRAN ODT) 4 MG disintegrating tablet Take 1 tablet (4 mg total) by mouth every 8 (eight) hours as needed for nausea or vomiting. 04/06/20   Crecencio Mc, MD  ondansetron (ZOFRAN) 4 MG tablet Take 1 tablet (4 mg total) by mouth every 8 (eight) hours as needed for nausea or vomiting. 10/15/18   [name], MD  pantoprazole (PROTONIX) 40 MG tablet Take 1 tablet (40 mg total) by mouth daily. 04/06/20   Crecencio Mc, MD  potassium chloride (KLOR-CON) 10 MEQ tablet Take 2 tablets (20 mEq total) by mouth once for 1 dose. Take with fluid pill 04/09/19 10/21/19  Minna Merritts, MD  ramipril (ALTACE) 10 MG capsule TAKE 1 CAPSULE BY MOUTH TWICE DAILY 04/03/20   Minna Merritts, MD  rosuvastatin (CRESTOR) 10 MG tablet Take 1 tablet (10 mg total) by mouth daily. 12/04/19 03/03/20  Minna Merritts, MD  TOUJEO SOLOSTAR 300 UNIT/ML Solostar Pen INJECT 50 UNITS DAILY MAY INCREASE 2U EVERY 3 DAYS UNTIL FBG IS AT GOAL UP TO 80 UNITS DAILY 05/22/19   [name], MD  Vitamin D, Cholecalciferol, 1000 units TABS Take 1,000 Units by mouth daily.     [provider]  zolpidem (AMBIEN) 5 MG tablet Take 1 tablet (5 mg total) by mouth at bedtime as needed for sleep. Stop trazadone not helpful 02/26/19   [name], MD      VITAL SIGNS:  Blood pressure (!) 161/59, pulse 65, temperature 98.3 F (36.8 C), temperature source Oral, resp. rate 17, height 6' 1"  (1.854 m), weight 97.1 kg, SpO2 97 %.  PHYSICAL EXAMINATION:  Physical Exam  GENERAL:  87 y.o.-year-old Caucasian male patient lying in the bed with no acute distress.  EYES: Pupils equal, round, reactive to light and accommodation. No scleral icterus. Extraocular muscles intact.  HEENT: Head atraumatic, normocephalic. Oropharynx and nasopharynx clear.  NECK:  Supple, no jugular venous distention. No thyroid enlargement, no tenderness.  LUNGS: Normal breath sounds bilaterally, no wheezing, rales,rhonchi or crepitation. No use of accessory muscles of respiration.  CARDIOVASCULAR: Regular rate and rhythm, S1, S2 normal. No murmurs, rubs, or gallops.  ABDOMEN: Soft, nondistended, with  mild generalized tenderness mainly in the right upper quadrant.  Bowel sounds present. No organomegaly or mass.  EXTREMITIES: No pedal edema, cyanosis, or clubbing.  NEUROLOGIC: Cranial nerves II through XII are intact. Muscle strength 5/5 in all extremities. Sensation intact. Gait  not checked.  PSYCHIATRIC: The patient is alert and oriented x 3.  Normal affect and good eye contact. SKIN: No obvious rash, lesion, or ulcer.   LABORATORY PANEL:   CBC Recent Labs  Lab 04/08/20 1313  WBC 6.3  HGB 13.3  HCT 36.4*  PLT 79*   ------------------------------------------------------------------------------------------------------------------  Chemistries  Recent Labs  Lab 04/08/20 1313  NA 133*  K 4.3  CL 102  CO2 21*  GLUCOSE 366*  BUN 32*  CREATININE 1.82*  CALCIUM 8.6*  AST 54*  ALT 99*  ALKPHOS 227*  BILITOT 5.0*   ------------------------------------------------------------------------------------------------------------------  Cardiac Enzymes No results for input(s): TROPONINI in the last 168 hours. ------------------------------------------------------------------------------------------------------------------  RADIOLOGY:  CT ABDOMEN PELVIS WO CONTRAST  Result Date: 04/08/2020 CLINICAL DATA:  Abdominal pain and abnormal liver function studies. EXAM: CT ABDOMEN AND PELVIS WITHOUT CONTRAST TECHNIQUE: Multidetector CT imaging of the abdomen and pelvis was performed following the standard protocol without IV contrast. COMPARISON:  09/07/2018 FINDINGS: Lower chest: The lung bases are clear of acute process. No pleural effusion or pulmonary lesions. The heart is normal in size. No pericardial effusion. There are stable coronary artery and aortic calcifications. The distal esophagus and aorta are unremarkable. Hepatobiliary: No hepatic lesions are identified without contrast. No intrahepatic biliary dilatation. The gallbladder is surgically absent. No common bile duct dilatation.  Pancreas: No mass, inflammation or ductal dilatation. Spleen: Mild stable splenomegaly.  No splenic lesions. Adrenals/Urinary Tract: The adrenal glands are normal. Mild age related renal cortical thinning and small stable renal cysts. No renal calculi, obstructing ureteral calculi or bladder mass or calculi. Stomach/Bowel: The stomach, duodenum, small bowel and colon are grossly normal. No acute inflammatory changes, mass lesions or obstructive findings. The terminal ileum is normal. The appendix is normal. Vascular/Lymphatic: Advanced vascular calcifications but no aneurysm. No mesenteric or retroperitoneal mass or adenopathy. Reproductive: Enlarged prostate gland. The seminal vesicles are unremarkable. Other: No pelvic mass or adenopathy. No free pelvic fluid collections. No inguinal mass or adenopathy. No abdominal wall hernia or subcutaneous lesions. Musculoskeletal: No significant bony findings. Stable advanced degenerative changes involving the spine. A right hip prosthesis is again noted. IMPRESSION: 1. No acute abdominal/pelvic findings, mass lesions or adenopathy. 2. Stable mild splenomegaly. 3. Status post cholecystectomy. No biliary dilatation. 4. Advanced vascular calcifications. 5. Enlarged prostate gland. Aortic Atherosclerosis (ICD10-I70.0). Electronically Signed   By: Marijo Sanes M.D.   On: 04/08/2020 14:40      IMPRESSION AND PLAN:   1.  Intractable nausea and vomiting with subsequent dehydration and acute kidney injury superimposed on stage IIIb chronic kidney disease likely prerenal secondarily with associated right upper quadrant abdominal pain.  Differential diagnosis would include viral gastritis and mild hepatitis. -The patient will be admitted to a medical monitored bed. -We will continue hydration with IV normal saline follow BMP. -COVID-19 PCR came back negative. -We will avoid nephrotoxins. -We will follow acute hepatitis panel.  2.  Elevated LFTs. -Right upper quadrant  ultrasound came back with fatty liver and cholecystectomy with no acute findings. -We will follow LFTs with hydration. -We will follow acute hepatitis panel. -GI consult to be obtained. -I notified Dr. Alice Reichert about the patient.  3.  Thrombocytopenia. -This is apparently chronic but has been slightly worsening could be related to chronic liver disease.. -We will follow platelets. -We will hold off aspirin now.  4.  Essential hypertension. -We will continue Norvasc and Coreg and hold off lisinopril given acute kidney injury.  5.  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 -We will hold off Metformin given acute kidney injury and place the patient on supplemental coverage with NovoLog. -We will continue basal coverage. -We will continue Neurontin.  6.  Dyslipidemia. -We will hold off statin therapy given elevated LFTs.  7.  BPH. -Continue Proscar.  8.  DVT prophylaxis. -SCDs. -Medical prophylaxis currently contraindicated due to thrombocytopenia.    All the records are reviewed and case discussed with ED provider. The plan of care was discussed in details with the patient (and family). I answered all questions. The patient agreed to proceed with the above mentioned plan. Further management will depend upon hospital course.   CODE STATUS: Full code  Status is: Inpatient  Remains inpatient appropriate because:Ongoing active pain requiring inpatient pain management, Ongoing diagnostic testing needed not appropriate for outpatient work up, Unsafe d/c plan, IV treatments appropriate due to intensity of illness or inability to take PO and Inpatient level of care appropriate due to severity of illness   Dispo: The patient is from: Home              Anticipated d/c is to: Home              Anticipated d/c date is: 2 days              Patient currently is not medically stable to d/c.   Difficult to place patient No  TOTAL TIME TAKING CARE OF THIS PATIENT: 55 minutes.     Christel Mormon M.D on 04/08/2020 at 3:24 PM  [facility]   From 7 PM-7 AM, contact night-coverage [email or web address]  CC: Primary care physician; [name], MD

## 2020-04-08 NOTE — ED Triage Notes (Signed)
Pt comes into the ED via POV d/t being sent by his MD for abnormal labs.  Pt states they drew blood this morning and called him to tell him his kidney and LFT values were off.  PT states he has had some nausea since Sunday and a low-grade fever at night.  Pt denies any other problems, is currently ambulatory to triage and in NAD.

## 2020-04-08 NOTE — Telephone Encounter (Signed)
Spoken to patient, he stated he tested himself at home for covid which was negative. His appetite is gone, his urine is orange. Patient wants to know if he may have a kidney infection or is the medication causing his urine to be orange. No dysuria, low volume of urine output, patient is staying hydrated, and no fever/chills.

## 2020-04-08 NOTE — Addendum Note (Signed)
Addended by: Crecencio Mc on: 04/08/2020 10:25 AM   Modules accepted: Orders

## 2020-04-08 NOTE — Consult Note (Signed)
GI Inpatient Consult Note  Reason for Consult: Elevated LFTs, cholestasis    Attending Requesting Consult: Dr. Eugenie Norrie  History of Present Illness: [name] [name] is a 87 y.o. male seen for evaluation of abnormal liver function tests at the request of Dr. Eugenie Norrie. Pt has a PMH of CAD s/p CABG x4 2006, HTN, HLD, IDDM, OA, GERD, and BPH. He presented to the [facility] ED at the request of his PCP (Dr. Derrel Nip) after he was called and told he had abnormal labs. He reports he had constellation of symptoms starting on Sunday while he was at [facility], including fevers up to 101.5 degrees, fatigue, nausea, reduced appetite, and generalized abdominal pain. He had also been constipated over the past 3-4 days with no bowel movement. He was seen via video visit by Dr. Derrel Nip on Monday of this week and was suspected of having COVID-19 infection, but influenza and covid tests were negative. He was given Zofran for nausea which was helpful. He had labs done showing thrombocytopenia, transaminitis, and hyperbilirubinemia. Upon presentation to the ED, he had labs showing mild hyponatremia 132, normal potassium, AKI with BUN 33 and Cr 1.95, and elevated LFTs with AST 53, ALT 100, alk phos 226, and t bili 5.1 (direct component 2.9). Afebrile here in the ED. No leukocytosis. He had CT abd/pelvis with contrast showed no biliary dilatation and no acute abdominopelvic findings. RUQ US showed fatty infiltration with no focal liver lesions or intrahepatic biliary duct dilatation. Of note, he is s/p lap cholecystectomy 05/2011. He reports he had similar symptoms almost a year ago and was seen by [place] [facility] physician Dr. Bonna Gains where there was discussion on doing work-up for mesenteric ischemia, but CTA was never ordered. He has history of chronically elevated alk phos - 155 in March 2018, 131 in December 2016). He had viral and autoimmune hepatitis labs drawn at this time which were negative. He does drink 2-4 drinks of alcohol per  week which include a combination of wine or bourbon. He was prescribed Cipro by PCP this week, but never picked it up. He was given nystatin cream for yeast infection one month ago. He denies any other recent medication changes. No new supplements. He has history of GERD which is well-controlled with omeprazole 20 mg daily.    Last Colonoscopy: 07/2012 - one small TA removed Last Endoscopy: N/A   Past Medical History:  Past Medical History:  Diagnosis Date  . BPH (benign prostatic hyperplasia)   . Cancer ([place])    skin  . Cataract   . Coronary artery disease    a. 4v CABG 01/2005; b. cath 06/05/13 showed patent grafts: LIMA-LAD, VG-D, VG-OM3, VG-dRCA, nl filling pressures, nl LV fxn  . Diastolic dysfunction    a. echo 10/2798: nl systolic fxn, mild LVH, diastolic relaxation abnormality, mildly enlarged LA, mild Ao insufficiency  . Dyspnea    with exertion  . ED (erectile dysfunction)   . GERD (gastroesophageal reflux disease)   . Hyperlipidemia   . Hypertension   . IDDM (insulin dependent diabetes mellitus) 2000  . Osteoarthritis   . Perirectal fistula   . Pneumonia 12/2016  . Shingles 09/04/2018  . Tubular adenoma of colon 07/2012  . Vertigo     Problem List: Patient Active Problem List   Diagnosis Date Noted  . AKI (acute kidney injury) ([place]) 04/08/2020  . Suspected COVID-19 virus infection 04/06/2020  . Candidal intertrigo 03/12/2020  . Scrotal lesion 03/12/2020  . Cellulitis 05/12/2019  . Fibula  fracture 05/09/2019  . Epigastric pain 05/09/2019  . Postherpetic neuralgia 05/09/2019  . B12 deficiency 05/09/2019  . Atypical chest pain 01/08/2019  . Weight loss 12/01/2018  . Vertigo 10/15/2018  . Diabetic retinopathy ([place]) 09/20/2018  . Bile duct abnormality 09/12/2018  . Stable angina ([place]) 03/12/2018  . Chronic low back pain 03/05/2018  . Skin tear of hand without complication, initial encounter 03/05/2018  . Abnormal breast exam 01/18/2018  . PAC (premature atrial  contraction) 01/18/2018  . Neuropathic pain 01/18/2018  . Demand ischemia ([place])   . Acute on chronic kidney failure ([place]) 01/07/2018  . Accelerated hypertension 01/07/2018  . Bell's palsy 08/18/2017  . H/O cold sores 08/18/2017  . Irregular heart beat 11/29/2016  . Insomnia 11/10/2016  . PLMD (periodic limb movement disorder) 08/17/2016  . Mild OSA 08/17/2016  . Intolerance of continuous positive airway pressure (CPAP) ventilation 08/17/2016  . Chronic arthritis 06/02/2016  . CKD (chronic kidney disease) stage 3, GFR 30-59 ml/min (HCC) 06/02/2016  . Diastolic dysfunction 35/00/9381  . Osteoarthritis 05/04/2016  . Chronic pain of both knees 10/06/2015  . Sensory abnormality of thoracic dermatome distribution 10/06/2015  . Spondylosis of lumbar region without myelopathy or radiculopathy 10/06/2015  . Unstable angina ([place]) 09/24/2015  . S/P CABG x 4   . Thrombocytopenia ([place]) 02/07/2015  . Type 2 diabetes mellitus with complication, without long-term current use of insulin ([place]) 02/06/2015  . Chronic diastolic CHF (congestive heart failure) ([place]) 01/01/2014  . CAD (coronary artery disease) 06/03/2013  . Diabetic neuropathy ([place]) 11/12/2012  . BPH (benign prostatic hyperplasia)   . GERD (gastroesophageal reflux disease)   . Hyperlipidemia 09/15/2009  . Essential hypertension 09/15/2009    Past Surgical History: Past Surgical History:  Procedure Laterality Date  . BACK SURGERY    . CARDIAC CATHETERIZATION  06/05/2013   [facility].   Marland Kitchen CARDIAC CATHETERIZATION N/A 09/24/2015   Procedure: Right Heart Cath and Coronary/Graft Angiography;  Surgeon: Minna Merritts, MD;  Location: [place] [facility];  Service: Cardiovascular;  Laterality: N/A;  . CATARACT EXTRACTION  sept 2013   right  . CATARACT EXTRACTION W/PHACO Left 03/28/2017   Procedure: CATARACT EXTRACTION PHACO AND INTRAOCULAR LENS PLACEMENT (IOC);  Surgeon: Birder Robson, MD;  Location: [facility] ORS;  Service: Ophthalmology;   Laterality: Left;  [place] 00:42.0 AP% 15.0 CDE 6.31 Fluid Pack lot # [id number]  . CHOLECYSTECTOMY  05/15/2011   Procedure: LAPAROSCOPIC CHOLECYSTECTOMY;  Surgeon: Rolm Bookbinder, MD;  Location: [place];  Service: General;  Laterality: N/A;  . COLONOSCOPY W/ POLYPECTOMY    . CORONARY ARTERY BYPASS GRAFT  2007   x 4  . EYE SURGERY    . FOOT SURGERY  2012   right foot  . JOINT REPLACEMENT  8/10   Right THR--[name]  . LEFT AND RIGHT HEART CATHETERIZATION WITH CORONARY/GRAFT ANGIOGRAM N/A 06/05/2013   Procedure: LEFT AND RIGHT HEART CATHETERIZATION WITH Beatrix Fetters;  Surgeon: Peter M Martinique, MD;  Location: [facility] C[facility] LAB;  Service: Cardiovascular;  Laterality: N/A;  . LUMBAR LAMINECTOMY  1989  . Prostate photovaporization  5/16   Dr Budd Palmer  . RIGHT/LEFT HEART CATH AND CORONARY/GRAFT ANGIOGRAPHY N/A 01/05/2018   Procedure: RIGHT/LEFT HEART CATH AND CORONARY/GRAFT ANGIOGRAPHY;  Surgeon: Minna Merritts, MD;  Location: [place] [facility];  Service: Cardiovascular;  Laterality: N/A;    Allergies: Allergies  Allergen Reactions  . Cortisone Other (See Comments)    Reaction: Leg Cramps  . Metformin Diarrhea  . Metformin And Related Diarrhea  Home Medications: Medications Prior to Admission  Medication Sig Dispense Refill Last Dose  . acetaminophen (TYLENOL) 500 MG tablet Take 1,000 mg by mouth 2 (two) times daily.     Marland Kitchen amLODipine (NORVASC) 5 MG tablet Take 1 tablet (5 mg total) by mouth daily. 90 tablet 3   . aspirin EC 81 MG tablet Take 81 mg by mouth daily.     . BD INSULIN SYRINGE ULTRAFINE 31G X 15/64" 0.3 ML MISC USE AS DIRECTED 100 each 3   . BD PEN NEEDLE NANO U/F 32G X 4 MM MISC USE AS DIRECTED 100 each 11   . blood glucose meter kit and supplies KIT OneTouch Verio w/ Device Kit OneTouch Verio VI STRP OneTouch Delica Lancet Dev misc OneTouch Delica Lancets 58X or Fine  Please check CBGs fasting and 2 other times during the day  Dx Code E11.8 1 each 0   .  carvedilol (COREG) 12.5 MG tablet TAKE ONE (1) TABLET BY MOUTH TWO TIMES PER DAY 180 tablet 0   . ciprofloxacin (CIPRO) 500 MG tablet Take 1 tablet (500 mg total) by mouth 2 (two) times daily. 20 tablet 0   . ezetimibe (ZETIA) 10 MG tablet Take 1 tablet (10 mg total) by mouth daily. 90 tablet 3   . finasteride (PROSCAR) 5 MG tablet Take 1 tablet (5 mg total) by mouth daily. 90 tablet 1   . furosemide (LASIX) 20 MG tablet TAKE 1 TABLET BY MOUTH DAILY 90 tablet 3   . gabapentin (NEURONTIN) 100 MG capsule TAKE 1 CAPSULE BY MOUTH 3 TIMES DAILY 90 capsule 3   . metFORMIN (GLUCOPHAGE-XR) 500 MG 24 hr tablet Take by mouth.     . nystatin ointment (MYCOSTATIN) Apply 1 application topically 2 (two) times daily. 30 g 0   . omeprazole (PRILOSEC) 20 MG capsule TAKE 1 CAPSULE BY MOUTH TWICE DAILY 60 capsule 1   . ondansetron (ZOFRAN ODT) 4 MG disintegrating tablet Take 1 tablet (4 mg total) by mouth every 8 (eight) hours as needed for nausea or vomiting. 20 tablet 0   . ondansetron (ZOFRAN) 4 MG tablet Take 1 tablet (4 mg total) by mouth every 8 (eight) hours as needed for nausea or vomiting. 10 tablet 0   . pantoprazole (PROTONIX) 40 MG tablet Take 1 tablet (40 mg total) by mouth daily. 30 tablet 3   . potassium chloride (KLOR-CON) 10 MEQ tablet Take 2 tablets (20 mEq total) by mouth once for 1 dose. Take with fluid pill 180 tablet 3   . ramipril (ALTACE) 10 MG capsule TAKE 1 CAPSULE BY MOUTH TWICE DAILY 180 capsule 4   . rosuvastatin (CRESTOR) 10 MG tablet Take 1 tablet (10 mg total) by mouth daily. 90 tablet 3   . TOUJEO SOLOSTAR 300 UNIT/ML Solostar Pen INJECT 50 UNITS DAILY MAY INCREASE 2U EVERY 3 DAYS UNTIL FBG IS AT GOAL UP TO 80 UNITS DAILY 22.5 mL 6   . Vitamin D, Cholecalciferol, 1000 units TABS Take 1,000 Units by mouth daily.      Marland Kitchen zolpidem (AMBIEN) 5 MG tablet Take 1 tablet (5 mg total) by mouth at bedtime as needed for sleep. Stop trazadone not helpful 30 tablet 0    Home medication  reconciliation was completed with the patient.   Scheduled Inpatient Medications:   . amLODipine  5 mg Oral Daily  . carvedilol  12.5 mg Oral BID WC  . [START ON 04/09/2020] cholecalciferol  1,000 Units Oral Daily  . [START ON  04/09/2020] ezetimibe  10 mg Oral Daily  . [START ON 04/09/2020] finasteride  5 mg Oral Daily  . gabapentin  100 mg Oral TID  . insulin aspart  0-9 Units Subcutaneous TID PC & HS  . [START ON 04/09/2020] insulin glargine  50 Units Subcutaneous Daily  . pantoprazole (PROTONIX) IV  40 mg Intravenous Q12H  . potassium chloride  20 mEq Oral Once    Continuous Inpatient Infusions:   . sodium chloride      PRN Inpatient Medications:  acetaminophen **OR** acetaminophen, magnesium hydroxide, ondansetron **OR** ondansetron (ZOFRAN) IV, ondansetron, ondansetron, zolpidem  Family History: family history includes Heart disease in his father; Lung cancer in his mother.  The patient's family history is negative for inflammatory bowel disorders, GI malignancy, or solid organ transplantation.  Social History:   reports that he has never smoked. He has never used smokeless tobacco. He reports current alcohol use. He reports that he does not use drugs. The patient denies ETOH, tobacco, or drug use.   Review of Systems: Constitutional: Weight is stable.  Eyes: No changes in vision. ENT: No oral lesions, sore throat.  GI: see HPI.  Heme/Lymph: No easy bruising.  CV: No chest pain.  GU: No hematuria.  Integumentary: No rashes.  Neuro: No headaches.  Psych: No depression/anxiety.  Endocrine: No heat/cold intolerance.  Allergic/Immunologic: No urticaria.  Resp: No cough, SOB.  Musculoskeletal: No joint swelling.    Physical Examination: BP (!) 165/126   Pulse (!) 57   Temp 98 F (36.7 C) (Oral)   Resp 18   Ht _0  (1.854 m)   Wt 97.1 kg   SpO2 95%   BMI 28.23 kg/m  Gen: NAD, alert and oriented x 4 HEENT: PEERLA, EOMI, Neck: supple, no JVD or thyromegaly Chest: CTA  bilaterally, no wheezes, crackles, or other adventitious sounds CV: RRR, no m/g/c/r Abd: soft, NT, ND, +BS in all four quadrants; no HSM, guarding, ridigity, or rebound tenderness Ext: no edema, well perfused with 2+ pulses, Skin: no rash or lesions noted Lymph: no LAD  Data: Lab Results  Component Value Date   WBC 6.3 04/08/2020   HGB 13.3 04/08/2020   HCT 36.4 (L) 04/08/2020   MCV 89.2 04/08/2020   PLT 79 (L) 04/08/2020   Recent Labs  Lab 04/08/20 1154 04/08/20 1313  HGB 13.0 13.3   Lab Results  Component Value Date   NA 133 (L) 04/08/2020   K 4.3 04/08/2020   CL 102 04/08/2020   CO2 21 (L) 04/08/2020   BUN 32 (H) 04/08/2020   CREATININE 1.82 (H) 04/08/2020   GLU 294 (H) 12/27/2017   Lab Results  Component Value Date   ALT 99 (H) 04/08/2020   AST 54 (H) 04/08/2020   GGT 49 05/20/2019   ALKPHOS 227 (H) 04/08/2020   BILITOT 4.5 (H) 04/08/2020   No results for input(s): APTT, INR, PTT in the last 168 hours.   RUQ [place] 04/08/2020: FINDINGS: Gallbladder:  The patient is status post cholecystectomy.  Common bile duct:  Diameter: 5 mm  Liver:  Diffuse increased echogenicity with slightly heterogeneous liver. Appearance typically secondary to fatty infiltration. Fibrosis secondary consideration. No secondary findings of cirrhosis noted. No focal hepatic lesion or intrahepatic biliary duct dilatation. Portal vein is patent on color Doppler imaging with normal direction of blood flow towards the liver.  Other: None.  IMPRESSION: Status post cholecystectomy.  No acute abnormality detected.  CT abd/pelvis with contrast 04/08/2020: IMPRESSION: 1. No acute abdominal/pelvic findings,  mass lesions or adenopathy. 2. Stable mild splenomegaly. 3. Status post cholecystectomy. No biliary dilatation. 4. Advanced vascular calcifications. 5. Enlarged prostate gland.   Assessment/Plan:  87 y/o Caucasian male with a PMH of CAD s/p CABG x4 2006, HTN, HLD,  IDDM, OA, GERD, and BPH presented to the [facility] ED for constellation of symptoms, including fevers, fatigue, right-sided to generalized abdominal pain, nausea, and weakness  1. Elevated LFTs - mild transaminitis and evidence of cholestasis with elevated alkaline phosphatase and direct hyperbilirubinemia. DDx includes viral hepatitis, acute viral illness/gastroenteritis (CMV, EBV), medication side effect/drug toxicity, autoimmune hepatitis, infiltrative liver diseases, PBC, retained and passed bile duct stone, malignancy, etc  2. Nausea without vomiting, RUQ/right-sided abdominal pain - contrasted CT and RUQ ultrasonography negative for any acute pathology, no evidence of biliary ductal dilatation  3. Thrombocytopenia - chronic in nature, possibly 2/2 #1  4. GERD - symptoms stable on home PPI  5. Acute Kidney Injury superimposed on CKD Stage IIIb  COVID-19 Test: NEGATIVE  Recommendations:  - Advise further work-up with labs - GGT to confirm hepatic origin of elevated alkaline phosphatase, alk phos isoenzyme to fractionate to see if coming from liver, bone, or small intestine, ANA/ASMA/AMA to further assess for possibility of AIH/PBC  - Mesenteric duplex to assess for any possible mesenteric ischemia - Follow-up on results of viral hepatitis and other labs ordered today - Continue home dose PPI - Diabetic diet as tolerated - Avoid hepatotoxic agents. Avoid NSAIDs.  - Following along with you    Thank you for the consult. Please call with questions or concerns.  Reeves Forth [facility] [phone] (Cell)

## 2020-04-08 NOTE — ED Notes (Signed)
Patient provided with water, family at bedside. [place] completed.

## 2020-04-08 NOTE — Telephone Encounter (Signed)
THE CIPRO HAS BEEN SENT SO HE CAN PICK IT UP WHILE HE IS OUT GETTING HTE LABS DONE

## 2020-04-08 NOTE — Telephone Encounter (Signed)
Please triage  Pt saw Dr. Derrel Nip on 1/31 for a VV.. pt said that he is now having lower back pain and his urine is orange. He wanted to know if this was do to the medication or if its a kidney infection

## 2020-04-08 NOTE — Telephone Encounter (Signed)
He may have a UTI  Or prostatitis,  if his symptoms have persisted and he has had a negative covid test. Please ask him to go to the lab at [place]  to have a urinalysis and bloodwork done . Marland Kitchen  I will call in an antibiotic for him TO [facility] to start today,  AFTER he submits the urine and blood   Daily use of Probiotics for  3 weeks advised to reduce risk of C dificile colitis.

## 2020-04-08 NOTE — Telephone Encounter (Signed)
Pt called and wanted to let Dr. Derrel Nip know that he just left from having his labs drawn

## 2020-04-08 NOTE — Telephone Encounter (Signed)
Patient has been notified of directions. He stated he will return call letting us know when he leave the lab per request.

## 2020-04-08 NOTE — ED Notes (Signed)
Patient provided with urinal for urine specimen collection

## 2020-04-08 NOTE — ED Provider Notes (Signed)
[facility] Emergency Department Provider Note  ____________________________________________   Event Date/Time   First MD Initiated Contact with Patient 04/08/20 1332     (approximate)  I have reviewed the triage vital signs and the nursing notes.   HISTORY  Chief Complaint Abnormal Lab    HPI [name] [name] is a 87 y.o. male  Here with abd pain, nausea, vomiting, fevers. Pt reports that for the past 3-4 days, he's had intermittent aching abd pain, nausea, and fatigue. He's had associated fevers.  Sx began when he was at church on Sunday. He has also noticed increasing lower back pain though this is a chronic issue. No new injuries. Reports that he went to his PCP today who sent him here for abnormal labs. He's had poor appetite, as well as constipation (normally has BMs daily, has not had any in 3-4 days). No other complaints. He is fully vaccinated.    Past Medical History:  Diagnosis Date  . BPH (benign prostatic hyperplasia)   . Cancer ([place])    skin  . Cataract   . Coronary artery disease    a. 4v CABG 01/2005; b. cath 06/05/13 showed patent grafts: LIMA-LAD, VG-D, VG-OM3, VG-dRCA, nl filling pressures, nl LV fxn  . Diastolic dysfunction    a. echo 08/2374: nl systolic fxn, mild LVH, diastolic relaxation abnormality, mildly enlarged LA, mild Ao insufficiency  . Dyspnea    with exertion  . ED (erectile dysfunction)   . GERD (gastroesophageal reflux disease)   . Hyperlipidemia   . Hypertension   . IDDM (insulin dependent diabetes mellitus) 2000  . Osteoarthritis   . Perirectal fistula   . Pneumonia 12/2016  . Shingles 09/04/2018  . Tubular adenoma of colon 07/2012  . Vertigo     Patient Active Problem List   Diagnosis Date Noted  . Suspected COVID-19 virus infection 04/06/2020  . Candidal intertrigo 03/12/2020  . Scrotal lesion 03/12/2020  . Cellulitis 05/12/2019  . Fibula fracture 05/09/2019  . Epigastric pain 05/09/2019  .  Postherpetic neuralgia 05/09/2019  . B12 deficiency 05/09/2019  . Atypical chest pain 01/08/2019  . Weight loss 12/01/2018  . Vertigo 10/15/2018  . Diabetic retinopathy ([place]) 09/20/2018  . Bile duct abnormality 09/12/2018  . Stable angina ([place]) 03/12/2018  . Chronic low back pain 03/05/2018  . Skin tear of hand without complication, initial encounter 03/05/2018  . Abnormal breast exam 01/18/2018  . PAC (premature atrial contraction) 01/18/2018  . Neuropathic pain 01/18/2018  . Demand ischemia ([place])   . Acute on chronic kidney failure ([place]) 01/07/2018  . Accelerated hypertension 01/07/2018  . Bell's palsy 08/18/2017  . H/O cold sores 08/18/2017  . Irregular heart beat 11/29/2016  . Insomnia 11/10/2016  . PLMD (periodic limb movement disorder) 08/17/2016  . Mild OSA 08/17/2016  . Intolerance of continuous positive airway pressure (CPAP) ventilation 08/17/2016  . Chronic arthritis 06/02/2016  . CKD (chronic kidney disease) stage 3, GFR 30-59 ml/min (HCC) 06/02/2016  . Diastolic dysfunction 28/31/5176  . Osteoarthritis 05/04/2016  . Chronic pain of both knees 10/06/2015  . Sensory abnormality of thoracic dermatome distribution 10/06/2015  . Spondylosis of lumbar region without myelopathy or radiculopathy 10/06/2015  . Unstable angina ([place]) 09/24/2015  . S/P CABG x 4   . Thrombocytopenia ([place]) 02/07/2015  . Type 2 diabetes mellitus with complication, without long-term current use of insulin ([place]) 02/06/2015  . Chronic diastolic CHF (congestive heart failure) ([place]) 01/01/2014  . CAD (coronary artery disease) 06/03/2013  .  Diabetic neuropathy ([place]) 11/12/2012  . BPH (benign prostatic hyperplasia)   . GERD (gastroesophageal reflux disease)   . Hyperlipidemia 09/15/2009  . Essential hypertension 09/15/2009    Past Surgical History:  Procedure Laterality Date  . BACK SURGERY    . CARDIAC CATHETERIZATION  06/05/2013   [facility].   Marland Kitchen CARDIAC CATHETERIZATION N/A 09/24/2015    Procedure: Right Heart Cath and Coronary/Graft Angiography;  Surgeon: Minna Merritts, MD;  Location: [facility];  Service: Cardiovascular;  Laterality: N/A;  . CATARACT EXTRACTION  sept 2013   right  . CATARACT EXTRACTION W/PHACO Left 03/28/2017   Procedure: CATARACT EXTRACTION PHACO AND INTRAOCULAR LENS PLACEMENT (IOC);  Surgeon: Birder Robson, MD;  Location: [facility]S;  Service: Ophthalmology;  Laterality: Left;  [place] 00:42.0 AP% 15.0 CDE 6.31 Fluid Pack lot # [id number]  . CHOLECYSTECTOMY  05/15/2011   Procedure: LAPAROSCOPIC CHOLECYSTECTOMY;  Surgeon: Rolm Bookbinder, MD;  Location: [facility];  Service: General;  Laterality: N/A;  . COLONOSCOPY W/ POLYPECTOMY    . CORONARY ARTERY BYPASS GRAFT  2007   x 4  . EYE SURGERY    . FOOT SURGERY  2012   right foot  . JOINT REPLACEMENT  8/10   Right THR--[name]  . LEFT AND RIGHT HEART CATHETERIZATION WITH CORONARY/GRAFT ANGIOGRAM N/A 06/05/2013   Procedure: LEFT AND RIGHT HEART CATHETERIZATION WITH Beatrix Fetters;  Surgeon: Peter M Martinique, MD;  [facility]: [facility];  Service: Cardiovascular;  Laterality: N/A;  . LUMBAR LAMINECTOMY  1989  . Prostate photovaporization  5/16   Dr Budd Palmer  . RIGHT/LEFT HEART CATH AND CORONARY/GRAFT ANGIOGRAPHY N/A 01/05/2018   Procedure: RIGHT/LEFT HEART CATH AND CORONARY/GRAFT ANGIOGRAPHY;  Surgeon: Minna Merritts, MD;  Location: [facility];  Service: Cardiovascular;  Laterality: N/A;    Prior to Admission medications   Medication Sig Start Date End Date Taking? Authorizing Provider  acetaminophen (TYLENOL) 500 MG tablet Take 1,000 mg by mouth 2 (two) times daily.    [provider]  amLODipine (NORVASC) 5 MG tablet Take 1 tablet (5 mg total) by mouth daily. 10/21/19   Minna Merritts, MD  aspirin EC 81 MG tablet Take 81 mg by mouth daily.    [provider]  BD INSULIN SYRINGE ULTRAFINE 31G X 15/64" 0.3 ML MISC USE AS DIRECTED 07/31/15   Venia Carbon,  MD  BD PEN NEEDLE NANO U/F 32G X 4 MM MISC USE AS DIRECTED 10/03/19   [name], MD  blood glucose meter kit and supplies KIT OneTouch Verio w/ Device Kit OneTouch Verio VI STRP OneTouch Delica Lancet Dev misc OneTouch Delica Lancets 41L or Fine  Please check CBGs fasting and 2 other times during the day  Dx Code E11.8 08/18/17   [name], MD  carvedilol (COREG) 12.5 MG tablet TAKE ONE (1) TABLET BY MOUTH TWO TIMES PER DAY 04/01/20   Minna Merritts, MD  ciprofloxacin (CIPRO) 500 MG tablet Take 1 tablet (500 mg total) by mouth 2 (two) times daily. 04/08/20   Crecencio Mc, MD  ezetimibe (ZETIA) 10 MG tablet Take 1 tablet (10 mg total) by mouth daily. 12/04/19 03/03/20  Minna Merritts, MD  finasteride (PROSCAR) 5 MG tablet Take 1 tablet (5 mg total) by mouth daily. 03/11/20   [name], MD  furosemide (LASIX) 20 MG tablet TAKE 1 TABLET BY MOUTH DAILY 03/02/20   Minna Merritts, MD  gabapentin (NEURONTIN) 100 MG capsule TAKE 1 CAPSULE BY  MOUTH 3 TIMES DAILY 10/14/19   [name], MD  metFORMIN (GLUCOPHAGE-XR) 500 MG 24 hr tablet Take by mouth. 01/20/20 01/19/21  [provider]  nystatin ointment (MYCOSTATIN) Apply 1 application topically 2 (two) times daily. 03/11/20   [name], MD  omeprazole (PRILOSEC) 20 MG capsule TAKE 1 CAPSULE BY MOUTH TWICE DAILY 04/07/20   [name], MD  ondansetron (ZOFRAN ODT) 4 MG disintegrating tablet Take 1 tablet (4 mg total) by mouth every 8 (eight) hours as needed for nausea or vomiting. 04/06/20   Crecencio Mc, MD  ondansetron (ZOFRAN) 4 MG tablet Take 1 tablet (4 mg total) by mouth every 8 (eight) hours as needed for nausea or vomiting. 10/15/18   [name], MD  pantoprazole (PROTONIX) 40 MG tablet Take 1 tablet (40 mg total) by mouth daily. 04/06/20   Crecencio Mc, MD  potassium chloride (KLOR-CON) 10 MEQ tablet Take 2 tablets (20 mEq total) by mouth once for 1 dose. Take with fluid pill  04/09/19 10/21/19  Minna Merritts, MD  ramipril (ALTACE) 10 MG capsule TAKE 1 CAPSULE BY MOUTH TWICE DAILY 04/03/20   Minna Merritts, MD  rosuvastatin (CRESTOR) 10 MG tablet Take 1 tablet (10 mg total) by mouth daily. 12/04/19 03/03/20  Minna Merritts, MD  TOUJEO SOLOSTAR 300 UNIT/ML Solostar Pen INJECT 50 UNITS DAILY MAY INCREASE 2U EVERY 3 DAYS UNTIL FBG IS AT GOAL UP TO 80 UNITS DAILY 05/22/19   [name], MD  Vitamin D, Cholecalciferol, 1000 units TABS Take 1,000 Units by mouth daily.     [provider]  zolpidem (AMBIEN) 5 MG tablet Take 1 tablet (5 mg total) by mouth at bedtime as needed for sleep. Stop trazadone not helpful 02/26/19   [name], MD    Allergies Cortisone, Metformin, and Metformin and related  Family History  Problem Relation Age of Onset  . Lung cancer Mother   . Heart disease Father   . Colon cancer Neg Hx   . Breast cancer Neg Hx     Social History Social History   Tobacco Use  . Smoking status: Never Smoker  . Smokeless tobacco: Never Used  Vaping Use  . Vaping Use: Never used  Substance Use Topics  . Alcohol use: Yes    Comment: occ cocktail  . Drug use: No    Review of Systems  Review of Systems  Constitutional: Positive for fatigue and fever. Negative for chills.  HENT: Negative for sore throat.   Respiratory: Negative for shortness of breath.   Cardiovascular: Negative for chest pain.  Gastrointestinal: Positive for abdominal pain, nausea and vomiting.  Genitourinary: Negative for flank pain.  Musculoskeletal: Negative for neck pain.  Skin: Negative for rash and wound.  Allergic/Immunologic: Negative for immunocompromised state.  Neurological: Positive for weakness. Negative for numbness.  Hematological: Does not bruise/bleed easily.  All other systems reviewed and are negative.    ____________________________________________  PHYSICAL EXAM:      VITAL SIGNS: ED Triage Vitals  Enc Vitals Group     BP  04/08/20 1312 (!) 163/67     Pulse Rate 04/08/20 1312 62     Resp 04/08/20 1312 18     Temp 04/08/20 1312 98.3 F (36.8 C)     Temp Source 04/08/20 1312 Oral     SpO2 04/08/20 1312 97 %     Weight 04/08/20 1313 214 lb (97.1 kg)     Height 04/08/20 1313  6' 1" (1.854 m)     Head Circumference --      Peak Flow --      Pain Score 04/08/20 1313 0     Pain Loc --      Pain Edu? --      Excl. in [place]? --      Physical Exam Vitals and nursing note reviewed.  Constitutional:      General: He is not in acute distress.    Appearance: He is well-developed.  HENT:     Head: Normocephalic and atraumatic.     Mouth/Throat:     Mouth: Mucous membranes are dry.  Eyes:     Conjunctiva/sclera: Conjunctivae normal.  Cardiovascular:     Rate and Rhythm: Normal rate and regular rhythm.     Heart sounds: Normal heart sounds. No murmur heard. No friction rub.  Pulmonary:     Effort: Pulmonary effort is normal. No respiratory distress.     Breath sounds: Normal breath sounds. No wheezing or rales.  Abdominal:     General: There is no distension.     Palpations: Abdomen is soft.     Tenderness: There is abdominal tenderness (mild, diffuse).  Musculoskeletal:     Cervical back: Neck supple.  Skin:    General: Skin is warm.     Capillary Refill: Capillary refill takes less than 2 seconds.  Neurological:     Mental Status: He is alert and oriented to person, place, and time.     Motor: No abnormal muscle tone.       ____________________________________________   LABS (all labs ordered are listed, but only abnormal results are displayed)  Labs Reviewed  COMPREHENSIVE METABOLIC PANEL - Abnormal; Notable for the following components:      Result Value   Sodium 133 (*)    CO2 21 (*)    Glucose, Bld 366 (*)    BUN 32 (*)    Creatinine, Ser 1.82 (*)    Calcium 8.6 (*)    Total Protein 6.1 (*)    Albumin 3.3 (*)    AST 54 (*)    ALT 99 (*)    Alkaline Phosphatase 227 (*)    Total  Bilirubin 5.0 (*)    GFR, Estimated 36 (*)    All other components within normal limits  CBC - Abnormal; Notable for the following components:   RBC 4.08 (*)    HCT 36.4 (*)    MCHC 36.5 (*)    Platelets 79 (*)    All other components within normal limits  SARS CORONAVIRUS 2 BY RT PCR (HOSPITAL ORDER, [facility])  LIPASE, BLOOD  URINALYSIS, COMPLETE (UACMP) WITH MICROSCOPIC  BILIRUBIN, DIRECT  BILIRUBIN, TOTAL  LACTATE DEHYDROGENASE  ACETAMINOPHEN LEVEL  HEPATITIS PANEL, ACUTE    ____________________________________________  EKG:  ________________________________________  RADIOLOGY All imaging, including plain films, CT scans, and ultrasounds, independently reviewed by me, and interpretations confirmed via formal radiology reads.  ED MD interpretation:   CT A/P: No acute abnormality  Official radiology report(s): CT ABDOMEN PELVIS WO CONTRAST  Result Date: 04/08/2020 CLINICAL DATA:  Abdominal pain and abnormal liver function studies. EXAM: CT ABDOMEN AND PELVIS WITHOUT CONTRAST TECHNIQUE: Multidetector CT imaging of the abdomen and pelvis was performed following the standard protocol without IV contrast. COMPARISON:  09/07/2018 FINDINGS: Lower chest: The lung bases are clear of acute process. No pleural effusion or pulmonary lesions. The heart is normal in size. No pericardial effusion. There are  stable coronary artery and aortic calcifications. The distal esophagus and aorta are unremarkable. Hepatobiliary: No hepatic lesions are identified without contrast. No intrahepatic biliary dilatation. The gallbladder is surgically absent. No common bile duct dilatation. Pancreas: No mass, inflammation or ductal dilatation. Spleen: Mild stable splenomegaly.  No splenic lesions. Adrenals/Urinary Tract: The adrenal glands are normal. Mild age related renal cortical thinning and small stable renal cysts. No renal calculi, obstructing ureteral calculi or bladder mass or  calculi. Stomach/Bowel: The stomach, duodenum, small bowel and colon are grossly normal. No acute inflammatory changes, mass lesions or obstructive findings. The terminal ileum is normal. The appendix is normal. Vascular/Lymphatic: Advanced vascular calcifications but no aneurysm. No mesenteric or retroperitoneal mass or adenopathy. Reproductive: Enlarged prostate gland. The seminal vesicles are unremarkable. Other: No pelvic mass or adenopathy. No free pelvic fluid collections. No inguinal mass or adenopathy. No abdominal wall hernia or subcutaneous lesions. Musculoskeletal: No significant bony findings. Stable advanced degenerative changes involving the spine. A right hip prosthesis is again noted. IMPRESSION: 1. No acute abdominal/pelvic findings, mass lesions or adenopathy. 2. Stable mild splenomegaly. 3. Status post cholecystectomy. No biliary dilatation. 4. Advanced vascular calcifications. 5. Enlarged prostate gland. Aortic Atherosclerosis (ICD10-I70.0). Electronically Signed   By: Marijo Sanes M.D.   On: 04/08/2020 14:40    ____________________________________________  PROCEDURES   Procedure(s) performed (including Critical Care):  .1-3 Lead EKG Interpretation Performed by: Duffy Bruce, MD Authorized by: Duffy Bruce, MD     Interpretation: normal     ECG rate:  60-70   ECG rate assessment: normal     Rhythm: sinus rhythm     Ectopy: none     Conduction: normal   Comments:     Indication: weakness    ____________________________________________  INITIAL IMPRESSION / MDM / [place] / ED COURSE  As part of my medical decision making, I reviewed the following data within the [place] notes reviewed and incorporated, Old chart reviewed, Notes from prior ED visits, and [place] Controlled Substance Database       *DONYELL DING was evaluated in Emergency Department on 04/08/2020 for the symptoms described in the history of present illness.  He was evaluated in the context of the global COVID-19 pandemic, which necessitated consideration that the patient might be at risk for infection with the SARS-CoV-2 virus that causes COVID-19. Institutional protocols and algorithms that pertain to the evaluation of patients at risk for COVID-19 are in a state of rapid change based on information released by regulatory bodies including the CDC and federal and state organizations. These policies and algorithms were followed during the patient's care in the ED.  Some ED evaluations and interventions may be delayed as a result of limited staffing during the pandemic.*     Medical Decision Making:  87 yo M here with generalized weakness, fevers, poor appetite, and abnormal labs. Reviewed labs, which are remarkable for thrombocytopenia, transaminitis, and hyperbilirubinemia. He has mild diffuse abd TTP so CT obtained, reviewed, and is unremarkable. CMP also shows mild AKI. Will admit for fluids, trending of labs, further work-up. DDx includes viral GI illness/hepatitis, dehydration, occult colitis/diverticulitis. Hold on ABX at this time given absence of signs of bacterial infection, normal WBC.  ____________________________________________  FINAL CLINICAL IMPRESSION(S) / ED DIAGNOSES  Final diagnoses:  RUQ pain  AKI (acute kidney injury) ([place])  Transaminitis  Elevated bilirubin     MEDICATIONS GIVEN DURING THIS VISIT:  Medications  sodium chloride 0.9 % bolus 1,000 mL (  1,000 mLs Intravenous New Bag/Given 04/08/20 1415)  ondansetron (ZOFRAN) injection 4 mg (4 mg Intravenous Given 04/08/20 1509)  0.9 %  sodium chloride infusion ( Intravenous New Bag/Given 04/08/20 1509)     ED Discharge Orders    None       Note:  This document was prepared using Dragon voice recognition software and may include unintentional dictation errors.   Duffy Bruce, MD 04/08/20 [phone]

## 2020-04-09 ENCOUNTER — Inpatient Hospital Stay: Payer: Medicare Other

## 2020-04-09 DIAGNOSIS — D696 Thrombocytopenia, unspecified: Secondary | ICD-10-CM

## 2020-04-09 DIAGNOSIS — N179 Acute kidney failure, unspecified: Secondary | ICD-10-CM | POA: Diagnosis not present

## 2020-04-09 DIAGNOSIS — N1832 Chronic kidney disease, stage 3b: Secondary | ICD-10-CM

## 2020-04-09 DIAGNOSIS — I10 Essential (primary) hypertension: Secondary | ICD-10-CM

## 2020-04-09 LAB — GLUCOSE, CAPILLARY
Glucose-Capillary: 120 mg/dL — ABNORMAL HIGH (ref 70–99)
Glucose-Capillary: 121 mg/dL — ABNORMAL HIGH (ref 70–99)

## 2020-04-09 LAB — COMPREHENSIVE METABOLIC PANEL
ALT: 72 U/L — ABNORMAL HIGH (ref 0–44)
AST: 46 U/L — ABNORMAL HIGH (ref 15–41)
Albumin: 2.9 g/dL — ABNORMAL LOW (ref 3.5–5.0)
Alkaline Phosphatase: 253 U/L — ABNORMAL HIGH (ref 38–126)
Anion gap: 8 (ref 5–15)
BUN: 26 mg/dL — ABNORMAL HIGH (ref 8–23)
CO2: 23 mmol/L (ref 22–32)
Calcium: 8.6 mg/dL — ABNORMAL LOW (ref 8.9–10.3)
Chloride: 107 mmol/L (ref 98–111)
Creatinine, Ser: 1.58 mg/dL — ABNORMAL HIGH (ref 0.61–1.24)
GFR, Estimated: 43 mL/min — ABNORMAL LOW (ref >60)
Glucose, Bld: 123 mg/dL — ABNORMAL HIGH (ref 70–99)
Potassium: 3.8 mmol/L (ref 3.5–5.1)
Sodium: 138 mmol/L (ref 135–145)
Total Bilirubin: 4 mg/dL — ABNORMAL HIGH (ref 0.3–1.2)
Total Protein: 5.5 g/dL — ABNORMAL LOW (ref 6.5–8.1)

## 2020-04-09 LAB — CBC
HCT: 33.2 % — ABNORMAL LOW (ref 39.0–52.0)
Hemoglobin: 11.9 g/dL — ABNORMAL LOW (ref 13.0–17.0)
MCH: 32.2 pg (ref 26.0–34.0)
MCHC: 35.8 g/dL (ref 30.0–36.0)
MCV: 90 fL (ref 80.0–100.0)
Platelets: 83 10*3/uL — ABNORMAL LOW (ref 150–400)
RBC: 3.69 MIL/uL — ABNORMAL LOW (ref 4.22–5.81)
RDW: 12.9 % (ref 11.5–15.5)
WBC: 6.3 10*3/uL (ref 4.0–10.5)
nRBC: 0 % (ref 0.0–0.2)

## 2020-04-09 LAB — URINE CULTURE: Culture: NO GROWTH

## 2020-04-09 LAB — GAMMA GT: GGT: 203 U/L — ABNORMAL HIGH (ref 7–50)

## 2020-04-09 MED ORDER — ADULT MULTIVITAMIN W/MINERALS CH
1.0000 | ORAL_TABLET | Freq: Every day | ORAL | Status: DC
  Administered 2020-04-10: 1 via ORAL
  Filled 2020-04-09: qty 1

## 2020-04-09 MED ORDER — ENSURE MAX PROTEIN PO LIQD
11.0000 [oz_av] | Freq: Two times a day (BID) | ORAL | Status: DC
  Administered 2020-04-09 – 2020-04-10 (×2): 11 [oz_av] via ORAL
  Filled 2020-04-09: qty 330

## 2020-04-09 NOTE — Progress Notes (Signed)
Inpatient Diabetes Program Recommendations  AACE/ADA: New Consensus Statement on Inpatient Glycemic Control (2015)  Target Ranges:  Prepandial:   less than 140 mg/dL      Peak postprandial:   less than 180 mg/dL (1-2 hours)      Critically ill patients:  140 - 180 mg/dL   Lab Results  Component Value Date   GLUCAP 120 (H) 04/09/2020   HGBA1C 7.8 (H) 04/08/2020    Review of Glycemic Control Results for [name] [name], [name] [name] (MRN [id number]) as of 04/09/2020 13:17  Ref. Range 04/08/2020 18:43 04/08/2020 20:37 04/09/2020 07:34  Glucose-Capillary Latest Ref Range: 70 - 99 mg/dL 248 (H) 278 (H) 120 (H)   Diabetes history: DM2 Outpatient Diabetes medications: Toujeo 50 QD + Metformin  Current orders for Inpatient glycemic control: Lantus 50 units + Novolog 0-9 units correction qid  Inpatient Diabetes Program Recommendations:    While in the hospital: -Decrease Lantus to 50% home dose of 25 units qd -Decrease Novolog hs correction to 0-5 units Secure chat sent to D. Amery.  Thank you, [name]. Fritzie Prioleau, RN, MSN, CDE  Diabetes Coordinator Inpatient Glycemic Control Team Team Pager [phone] (8am-5pm) 04/09/2020 1:22 PM

## 2020-04-09 NOTE — Progress Notes (Signed)
   04/09/20 0955  Clinical Encounter Type  Visited With Patient and family together  Visit Type Initial   I visited with [name] [name] and his wife. He was appreciative of the visit. He expressed some frustration with the machines in his room. Overall, he expressed he was doing well other than the loud machine.   [place], [place]

## 2020-04-09 NOTE — Progress Notes (Signed)
Patient wants to sleep tonight so vitals will be deferred until the morning.  Will continue to monitor. Christene Slates

## 2020-04-09 NOTE — Progress Notes (Signed)
Initial Nutrition Assessment  DOCUMENTATION CODES:   Not applicable  INTERVENTION:   Ensure Max protein supplement BID, each supplement provides 150kcal and 30g of protein.  MVI daily   NUTRITION DIAGNOSIS:   Inadequate oral intake related to acute illness as evidenced by per patient/family report.  GOAL:   Patient will meet greater than or equal to 90% of their needs  MONITOR:   PO intake,Supplement acceptance,Weight trends,Labs,Skin,I & O's  REASON FOR ASSESSMENT:   Malnutrition Screening Tool    ASSESSMENT:   87 y.o. caucasian male with a known history of multiple medical problems that are mentioned below including hypertension, CKD III, dyslipidemia, type diabetes mellitus, coronary artery disease status post CABG, osteoarthritis and BPH who presented to the emergency room with right upper quadrant abdominal pain with associated nausea/vomiting, AKI and elevated liver enzymes.  Met with pt in room today. Pt reports decreased appetite and oral intake for several months pta; pt reports "I just have no desire to eat". Wife at bedside reports that patient does not hardly eat anything. Pt reports that he has not been drinking any supplements at home. Pt is reluctant to try supplements but upon discussing the importance of adequate nutrition needed to preserve lean muscle, pt agrees to try Ensure Max. Pt reports that he did not eat breakfast this morning as he was NPO for procedure but reports that he did eat a ham sandwich for lunch. RD will order supplements and MVI to help pt meet his estimated needs (prefers strawberry or chocolate). Per chart, pt appears weight stable at baseline.   Medications reviewed and include: vitamin D, insulin, protonix, NaCl @100ml /hr  Labs reviewed: BUN 26(H), creat 1.58(H), alk phos 253(H), AST 46(H), ALT 72(H) cbgs- 248, 278, 120 x 24 hrs AIC 7.8(H)- 2/2  NUTRITION - FOCUSED PHYSICAL EXAM:  Flowsheet Row Most Recent Value  Orbital Region No  depletion  Upper Arm Region Mild depletion  Thoracic and Lumbar Region No depletion  Buccal Region No depletion  Temple Region No depletion  Clavicle Bone Region No depletion  Clavicle and Acromion Bone Region No depletion  Scapular Bone Region No depletion  Dorsal Hand Mild depletion  Patellar Region Mild depletion  Anterior Thigh Region Mild depletion  Posterior Calf Region Mild depletion  Edema (RD Assessment) None  Hair Reviewed  Eyes Reviewed  Mouth Reviewed  Skin Reviewed  Nails Reviewed     Diet Order:   Diet Order            Diet Carb Modified Fluid consistency: Thin; Room service appropriate? Yes  Diet effective now                EDUCATION NEEDS:   Education needs have been addressed  Skin:  Skin Assessment: Reviewed RN Assessment  Last BM:  1/31  Height:   Ht Readings from Last 1 Encounters:  04/08/20 6' 1"  (1.854 m)    Weight:   Wt Readings from Last 1 Encounters:  04/08/20 97.1 kg    Ideal Body Weight:  83.6 kg  BMI:  Body mass index is 28.23 kg/m.  Estimated Nutritional Needs:   Kcal:  2100-2400kcal/day  Protein:  105-120g/day  Fluid:  2.1-2.4L/day  Koleen Distance MS, RD, LDN Please refer to [facility] for RD and/or RD on-call/weekend/after hours pager

## 2020-04-09 NOTE — Progress Notes (Signed)
PROGRESS NOTE    [name] [name]  WNI:[id number] DOB: Jun 25, 1933 DOA: 04/08/2020 PCP: [name], MD    Brief Narrative:  [name] [name]  is a 87 y.o. Caucasian male with a known history of multiple medical problems that are mentioned below including hypertension, dyslipidemia, type diabetes mellitus, coronary artery disease status post CABG, osteoarthritis and BPH, who presented to the emergency room with right upper quadrant abdominal pain with associated nausea and vomiting as well as fever with a T-max of one 1.5 for the last 3 to 4 days with associated generalized fatigue and tiredness.He has been having diminished appetite.Labs revealed mild hyponatremia and hyperglycemia of 371 and later 366, BUN of 33 and creatinine 1.95 later 1.82, alk phos of 226 and later 227, AST 53 and later 54, ALT 100 and later 99 total protein of 6.2 with albumin of 3.3 and total bili of 5.1 with direct bili of 2.9.  CBC showed thrombocytopenia of 82 and later 79.  UA was unremarkable.  Abdominal pelvic CT scan revealed stable mild splenomegaly, cholecystectomy with no biliary dilatation, advanced vascular calcifications, enlarged prostate gland and aortic atherosclerosis with no acute findings.  COVID-19 PCR came back negative.    Consultants:   GI  Procedures: liver US  Antimicrobials:       Subjective: Mild LQ "pinchy" like pain when he pokes his abdomen.. no n/v/d.   Objective: Vitals:   04/08/20 1821 04/08/20 1925 04/09/20 0056 04/09/20 0447  BP: (!) 165/126 (!) 154/75 (!) 131/45 (!) 152/40  Pulse: (!) 57 60 65 (!) 59  Resp: 18 16 20 16   Temp: 98 F (36.7 C) 98.4 F (36.9 C) 98.2 F (36.8 C) 98.7 F (37.1 C)  TempSrc: Oral Oral Oral Oral  SpO2: 95% 100% 97% 96%  Weight:      Height:        Intake/Output Summary (Last 24 hours) at 04/09/2020 0823 Last data filed at 04/09/2020 0657 Gross per 24 hour  Intake --  Output 1175 ml  Net -1175 ml   Filed Weights   04/08/20 1313   Weight: 97.1 kg    Examination:  General exam: Appears calm and comfortable  Respiratory system: Clear to auscultation. Respiratory effort normal. Cardiovascular system: S1 & S2 heard, RRR. No JVD, murmurs, rubs, gallops or clicks.  Gastrointestinal system: Abdomen is nondistended, soft and nontender. Normal bowel sounds heard. Central nervous system: Alert and oriented. No focal neurological deficits. Extremities:no edema Skin: warm, dry Psychiatry: Judgement and insight appear normal. Mood & affect appropriate.     Data Reviewed: I have personally reviewed following labs and imaging studies  CBC: Recent Labs  Lab 04/08/20 1154 04/08/20 1313  WBC 6.6 6.3  NEUTROABS 5.1  --   HGB 13.0 13.3  HCT 36.5* 36.4*  MCV 90.6 89.2  PLT 82* 79*   Basic Metabolic Panel: Recent Labs  Lab 04/08/20 1154 04/08/20 1313  NA 132* 133*  K 4.1 4.3  CL 100 102  CO2 23 21*  GLUCOSE 371* 366*  BUN 33* 32*  CREATININE 1.95* 1.82*  CALCIUM 8.5* 8.6*   GFR: Estimated Creatinine Clearance: 37.1 mL/min (A) (by C-G formula based on SCr of 1.82 mg/dL (H)). Liver Function Tests: Recent Labs  Lab 04/08/20 1154 04/08/20 1313 04/08/20 1452  AST 53* 54*  --   ALT 100* 99*  --   ALKPHOS 226* 227*  --   BILITOT 5.1* 5.0* 4.5*  PROT 6.2* 6.1*  --   ALBUMIN 3.3* 3.3*  --  Recent Labs  Lab 04/08/20 1313  LIPASE 26   No results for input(s): AMMONIA in the last 168 hours. Coagulation Profile: Recent Labs  Lab 04/08/20 2037  INR 1.0   Cardiac Enzymes: No results for input(s): CKTOTAL, CKMB, CKMBINDEX, TROPONINI in the last 168 hours. BNP (last 3 results) No results for input(s): PROBNP in the last 8760 hours. HbA1C: Recent Labs    04/08/20 1313  HGBA1C 7.8*   CBG: Recent Labs  Lab 04/08/20 1843 04/08/20 2037  GLUCAP 248* 278*   Lipid Profile: No results for input(s): CHOL, HDL, LDLCALC, TRIG, CHOLHDL, LDLDIRECT in the last 72 hours. Thyroid Function Tests: No  results for input(s): TSH, T4TOTAL, FREET4, T3FREE, THYROIDAB in the last 72 hours. Anemia Panel: No results for input(s): VITAMINB12, FOLATE, FERRITIN, TIBC, IRON, RETICCTPCT in the last 72 hours. Sepsis Labs: No results for input(s): PROCALCITON, LATICACIDVEN in the last 168 hours.  Recent Results (from the past 240 hour(s))  SARS Coronavirus 2 by RT PCR (hospital order, performed in [facility] hospital lab) Nasopharyngeal Nasopharyngeal Swab     Status: None   Collection Time: 04/08/20  2:51 PM   Specimen: Nasopharyngeal Swab  Result Value Ref Range Status   SARS Coronavirus 2 NEGATIVE NEGATIVE Final    Comment: (NOTE) SARS-CoV-2 target nucleic acids are NOT DETECTED.  The SARS-CoV-2 RNA is generally detectable in upper and lower respiratory specimens during the acute phase of infection. The lowest concentration of SARS-CoV-2 viral copies this assay can detect is 250 copies / mL. A negative result does not preclude SARS-CoV-2 infection and should not be used as the sole basis for treatment or other patient management decisions.  A negative result may occur with improper specimen collection / handling, submission of specimen other than nasopharyngeal swab, presence of viral mutation(s) within the areas targeted by this assay, and inadequate number of viral copies (<250 copies / mL). A negative result must be combined with clinical observations, patient history, and epidemiological information.  Fact Sheet for Patients:   StrictlyIdeas.no  Fact Sheet for Healthcare Providers: BankingDealers.co.za  This test is not yet approved or  cleared by the [place] FDA and has been authorized for detection and/or diagnosis of SARS-CoV-2 by FDA under an Emergency Use Authorization (EUA).  This EUA will remain in effect (meaning this test can be used) for the duration of the COVID-19 declaration under Section 564(b)(1) of the Act, 21  U.S.C. section 360bbb-3(b)(1), unless the authorization is terminated or revoked sooner.  Performed at [facility], [address], [place], [place] [address]          Radiology Studies: CT ABDOMEN PELVIS WO CONTRAST  Result Date: 04/08/2020 CLINICAL DATA:  Abdominal pain and abnormal liver function studies. EXAM: CT ABDOMEN AND PELVIS WITHOUT CONTRAST TECHNIQUE: Multidetector CT imaging of the abdomen and pelvis was performed following the standard protocol without IV contrast. COMPARISON:  09/07/2018 FINDINGS: Lower chest: The lung bases are clear of acute process. No pleural effusion or pulmonary lesions. The heart is normal in size. No pericardial effusion. There are stable coronary artery and aortic calcifications. The distal esophagus and aorta are unremarkable. Hepatobiliary: No hepatic lesions are identified without contrast. No intrahepatic biliary dilatation. The gallbladder is surgically absent. No common bile duct dilatation. Pancreas: No mass, inflammation or ductal dilatation. Spleen: Mild stable splenomegaly.  No splenic lesions. Adrenals/Urinary Tract: The adrenal glands are normal. Mild age related renal cortical thinning and small stable renal cysts. No renal calculi, obstructing ureteral calculi  or bladder mass or calculi. Stomach/Bowel: The stomach, duodenum, small bowel and colon are grossly normal. No acute inflammatory changes, mass lesions or obstructive findings. The terminal ileum is normal. The appendix is normal. Vascular/Lymphatic: Advanced vascular calcifications but no aneurysm. No mesenteric or retroperitoneal mass or adenopathy. Reproductive: Enlarged prostate gland. The seminal vesicles are unremarkable. Other: No pelvic mass or adenopathy. No free pelvic fluid collections. No inguinal mass or adenopathy. No abdominal wall hernia or subcutaneous lesions. Musculoskeletal: No significant bony findings. Stable advanced degenerative changes involving the spine.  A right hip prosthesis is again noted. IMPRESSION: 1. No acute abdominal/pelvic findings, mass lesions or adenopathy. 2. Stable mild splenomegaly. 3. Status post cholecystectomy. No biliary dilatation. 4. Advanced vascular calcifications. 5. Enlarged prostate gland. Aortic Atherosclerosis (ICD10-I70.0). Electronically Signed   By: Marijo Sanes M.D.   On: 04/08/2020 14:40   US Abdomen Limited RUQ (LIVER/GB)  Result Date: 04/08/2020 CLINICAL DATA:  Right upper quadrant abdominal pain x3 days. EXAM: ULTRASOUND ABDOMEN LIMITED RIGHT UPPER QUADRANT COMPARISON:  04/08/2020 FINDINGS: Gallbladder: The patient is status post cholecystectomy. Common bile duct: Diameter: 5 mm Liver: Diffuse increased echogenicity with slightly heterogeneous liver. Appearance typically secondary to fatty infiltration. Fibrosis secondary consideration. No secondary findings of cirrhosis noted. No focal hepatic lesion or intrahepatic biliary duct dilatation. Portal vein is patent on color Doppler imaging with normal direction of blood flow towards the liver. Other: None. IMPRESSION: Status post cholecystectomy.  No acute abnormality detected. Electronically Signed   By: Constance Holster M.D.   On: 04/08/2020 15:53        Scheduled Meds: . amLODipine  5 mg Oral Daily  . carvedilol  12.5 mg Oral BID WC  . cholecalciferol  1,000 Units Oral Daily  . ezetimibe  10 mg Oral Daily  . finasteride  5 mg Oral Daily  . gabapentin  100 mg Oral TID  . insulin aspart  0-9 Units Subcutaneous TID PC & HS  . insulin glargine  50 Units Subcutaneous Daily  . pantoprazole (PROTONIX) IV  40 mg Intravenous Q12H   Continuous Infusions: . sodium chloride 100 mL/hr at 04/09/20 0655    Assessment & Plan:   Active Problems:   AKI (acute kidney injury) ([place])   1.  Intractable nausea and vomiting  Elevated LFT's GI consulted, input appreciated-contrast CT and right upper quadrant ultrasound negative for any acute pathology, no evidence of  biliary ductal dilatation We will check ANA/ASMA/AMA to further assess for possibility of evaluate/PBC Obtain mesenteric duplex to assess for possible mesenteric ischemia follow-up viral hepatitis Continue PPI   2.AKI superimposed on  CKD Stage IIIb-likely secondary to dehydration Improving, creatinine 1.58 today  will continue with IV fluids avoid nephrotoxic medications      3.  Thrombocytopenia.-Chronic Hold aspirin for now Continue monitoring   4.  Essential hypertension. -Stable, continue with Norvasc and Coreg  Hold ACEI due to AKI     5.  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 -BG stable, Continue RISS and neurontin  6.  Dyslipidemia. -hold statin due to elevated LFT  7.  BPH. -Continue Proscar.  8.  DVT prophylaxis. -SCDs. -Medical prophylaxis currently contraindicated due to thrombocytopenia.   DVT prophylaxis: scd Code Status:full Family Communication: none at bedside  Status is: Inpatient  Remains inpatient appropriate because:Inpatient level of care appropriate due to severity of illness   Dispo: The patient is from: Home              Anticipated d/c is to:  Home              Anticipated d/c date is: 2 days              Patient currently is not medically stable to d/c.   Difficult to place patient No            LOS: 1 day   Time spent: 35 minutes with more than 50% on [place], MD [facility] Pager [phone]  If 7PM-7AM, please contact night-coverage 04/09/2020, 8:23 AM

## 2020-04-10 DIAGNOSIS — R112 Nausea with vomiting, unspecified: Secondary | ICD-10-CM | POA: Diagnosis not present

## 2020-04-10 DIAGNOSIS — R7989 Other specified abnormal findings of blood chemistry: Secondary | ICD-10-CM | POA: Diagnosis not present

## 2020-04-10 DIAGNOSIS — N179 Acute kidney failure, unspecified: Secondary | ICD-10-CM | POA: Diagnosis not present

## 2020-04-10 DIAGNOSIS — I10 Essential (primary) hypertension: Secondary | ICD-10-CM | POA: Diagnosis not present

## 2020-04-10 LAB — GLUCOSE, CAPILLARY
Glucose-Capillary: 63 mg/dL — ABNORMAL LOW (ref 70–99)
Glucose-Capillary: 105 mg/dL — ABNORMAL HIGH (ref 70–99)
Glucose-Capillary: 216 mg/dL — ABNORMAL HIGH (ref 70–99)
Glucose-Capillary: 225 mg/dL — ABNORMAL HIGH (ref 70–99)
Glucose-Capillary: 228 mg/dL — ABNORMAL HIGH (ref 70–99)

## 2020-04-10 LAB — BILIRUBIN, DIRECT: Bilirubin, Direct: 2 mg/dL — ABNORMAL HIGH (ref 0.0–0.2)

## 2020-04-10 LAB — CBC
HCT: 32.7 % — ABNORMAL LOW (ref 39.0–52.0)
Hemoglobin: 11.4 g/dL — ABNORMAL LOW (ref 13.0–17.0)
MCH: 31.6 pg (ref 26.0–34.0)
MCHC: 34.9 g/dL (ref 30.0–36.0)
MCV: 90.6 fL (ref 80.0–100.0)
Platelets: 70 10*3/uL — ABNORMAL LOW (ref 150–400)
RBC: 3.61 MIL/uL — ABNORMAL LOW (ref 4.22–5.81)
RDW: 13.1 % (ref 11.5–15.5)
WBC: 4.6 10*3/uL (ref 4.0–10.5)
nRBC: 0 % (ref 0.0–0.2)

## 2020-04-10 LAB — EPSTEIN-BARR VIRUS (EBV) ANTIBODY PROFILE
EBV NA IgG: 412 U/mL — ABNORMAL HIGH (ref 0.0–17.9)
EBV VCA IgG: >600 U/mL — ABNORMAL HIGH (ref 0.0–17.9)
EBV VCA IgM: <36 U/mL (ref 0.0–35.9)

## 2020-04-10 LAB — ANA: Anti Nuclear Antibody (ANA): NEGATIVE

## 2020-04-10 LAB — COMPREHENSIVE METABOLIC PANEL
ALT: 91 U/L — ABNORMAL HIGH (ref 0–44)
AST: 112 U/L — ABNORMAL HIGH (ref 15–41)
Albumin: 2.7 g/dL — ABNORMAL LOW (ref 3.5–5.0)
Alkaline Phosphatase: 359 U/L — ABNORMAL HIGH (ref 38–126)
Anion gap: 9 (ref 5–15)
BUN: 23 mg/dL (ref 8–23)
CO2: 20 mmol/L — ABNORMAL LOW (ref 22–32)
Calcium: 8.4 mg/dL — ABNORMAL LOW (ref 8.9–10.3)
Chloride: 107 mmol/L (ref 98–111)
Creatinine, Ser: 1.3 mg/dL — ABNORMAL HIGH (ref 0.61–1.24)
GFR, Estimated: 54 mL/min — ABNORMAL LOW (ref >60)
Glucose, Bld: 242 mg/dL — ABNORMAL HIGH (ref 70–99)
Potassium: 3.9 mmol/L (ref 3.5–5.1)
Sodium: 136 mmol/L (ref 135–145)
Total Bilirubin: 3.5 mg/dL — ABNORMAL HIGH (ref 0.3–1.2)
Total Protein: 5.3 g/dL — ABNORMAL LOW (ref 6.5–8.1)

## 2020-04-10 LAB — CMV ANTIBODY, IGG (EIA): CMV Ab - IgG: >10 U/mL — ABNORMAL HIGH (ref 0.00–0.59)

## 2020-04-10 LAB — ANGIOTENSIN CONVERTING ENZYME: Angiotensin-Converting Enzyme: 10 U/L — ABNORMAL LOW (ref 14–82)

## 2020-04-10 LAB — MITOCHONDRIAL ANTIBODIES: Mitochondrial M2 Ab, IgG: <20 Units (ref 0.0–20.0)

## 2020-04-10 LAB — ANTI-SMOOTH MUSCLE ANTIBODY, IGG: F-Actin IgG: 4 Units (ref 0–19)

## 2020-04-10 MED ORDER — INSULIN GLARGINE 100 UNIT/ML ~~LOC~~ SOLN
30.0000 [IU] | Freq: Every day | SUBCUTANEOUS | Status: DC
  Filled 2020-04-10 (×2): qty 0.3

## 2020-04-10 MED ORDER — EZETIMIBE 10 MG PO TABS: 10.0000 mg | ORAL_TABLET | Freq: Every day | ORAL | 3 refills | Status: DC

## 2020-04-10 NOTE — Progress Notes (Deleted)
PROGRESS NOTE    [name] [name]  LTJ:[id number] DOB: [birth year] DOA: 04/08/2020 PCP: [name], MD    Brief Narrative:  [name] [name]  is a 87 y.o. Caucasian male with a known history of multiple medical problems that are mentioned below including hypertension, dyslipidemia, type diabetes mellitus, coronary artery disease status post CABG, osteoarthritis and BPH, who presented to the emergency room with right upper quadrant abdominal pain with associated nausea and vomiting as well as fever with a T-max of one 1.5 for the last 3 to 4 days with associated generalized fatigue and tiredness.He has been having diminished appetite.Labs revealed mild hyponatremia and hyperglycemia of 371 and later 366, BUN of 33 and creatinine 1.95 later 1.82, alk phos of 226 and later 227, AST 53 and later 54, ALT 100 and later 99 total protein of 6.2 with albumin of 3.3 and total bili of 5.1 with direct bili of 2.9.  CBC showed thrombocytopenia of 82 and later 79.  UA was unremarkable.  Abdominal pelvic CT scan revealed stable mild splenomegaly, cholecystectomy with no biliary dilatation, advanced vascular calcifications, enlarged prostate gland and aortic atherosclerosis with no acute findings.  COVID-19 PCR came back negative.  2/4-c/o having not much appetite. Did not eat breakfast  Consultants:   GI  Procedures: liver US  Antimicrobials:       Subjective: Mild abd pain. No n/v  Objective: Vitals:   04/09/20 2029 04/10/20 0450 04/10/20 0800 04/10/20 1112  BP: (!) 154/72 (!) 143/48 (!) 134/51 137/60  Pulse: (!) 59 61 61 (!) 56  Resp: 16 20 20 18   Temp: 98.5 F (36.9 C) 97.7 F (36.5 C) 98.5 F (36.9 C) (!) 97.5 F (36.4 C)  TempSrc: Oral Oral Oral Oral  SpO2: 96% 95% 97% 96%  Weight:      Height:        Intake/Output Summary (Last 24 hours) at 04/10/2020 1435 Last data filed at 04/10/2020 0762 Gross per 24 hour  Intake 1312.16 ml  Output 500 ml  Net 812.16 ml   Filed Weights    04/08/20 1313  Weight: 97.1 kg    Examination: Calm, lying in bed, NAD CTA no wheeze rales rhonchi's RRR, S1-S2 no gallop Soft nd/nt +bs No edema Aaxoxo3, grossly intact    Data Reviewed: I have personally reviewed following labs and imaging studies  CBC: Recent Labs  Lab 04/08/20 1154 04/08/20 1313 04/09/20 0826 04/10/20 0832  WBC 6.6 6.3 6.3 4.6  NEUTROABS 5.1  --   --   --   HGB 13.0 13.3 11.9* 11.4*  HCT 36.5* 36.4* 33.2* 32.7*  MCV 90.6 89.2 90.0 90.6  PLT 82* 79* 83* 70*   Basic Metabolic Panel: Recent Labs  Lab 04/08/20 1154 04/08/20 1313 04/09/20 0826 04/10/20 0832  NA 132* 133* 138 136  K 4.1 4.3 3.8 3.9  CL 100 102 107 107  CO2 23 21* 23 20*  GLUCOSE 371* 366* 123* 242*  BUN 33* 32* 26* 23  CREATININE 1.95* 1.82* 1.58* 1.30*  CALCIUM 8.5* 8.6* 8.6* 8.4*   GFR: Estimated Creatinine Clearance: 51.9 mL/min (A) (by C-G formula based on SCr of 1.3 mg/dL (H)). Liver Function Tests: Recent Labs  Lab 04/08/20 1154 04/08/20 1313 04/08/20 1452 04/09/20 0826 04/10/20 0832  AST 53* 54*  --  46* 112*  ALT 100* 99*  --  72* 91*  ALKPHOS 226* 227*  --  253* 359*  BILITOT 5.1* 5.0* 4.5* 4.0* 3.5*  PROT 6.2* 6.1*  --  5.5* 5.3*  ALBUMIN 3.3* 3.3*  --  2.9* 2.7*   Recent Labs  Lab 04/08/20 1313  LIPASE 26   No results for input(s): AMMONIA in the last 168 hours. Coagulation Profile: Recent Labs  Lab 04/08/20 2037  INR 1.0   Cardiac Enzymes: No results for input(s): CKTOTAL, CKMB, CKMBINDEX, TROPONINI in the last 168 hours. BNP (last 3 results) No results for input(s): PROBNP in the last 8760 hours. HbA1C: Recent Labs    04/08/20 1313  HGBA1C 7.8*   CBG: Recent Labs  Lab 04/09/20 1635 04/09/20 1753 04/09/20 2030 04/10/20 0737 04/10/20 1113  GLUCAP 63* 105* 216* 225* 228*   Lipid Profile: No results for input(s): CHOL, HDL, LDLCALC, TRIG, CHOLHDL, LDLDIRECT in the last 72 hours. Thyroid Function Tests: No results for input(s):  TSH, T4TOTAL, FREET4, T3FREE, THYROIDAB in the last 72 hours. Anemia Panel: No results for input(s): VITAMINB12, FOLATE, FERRITIN, TIBC, IRON, RETICCTPCT in the last 72 hours. Sepsis Labs: No results for input(s): PROCALCITON, LATICACIDVEN in the last 168 hours.  Recent Results (from the past 240 hour(s))  Urine Culture     Status: None   Collection Time: 04/08/20 11:47 AM   Specimen: Urine, Random  Result Value Ref Range Status   Specimen Description   Final    URINE, RANDOM Performed at [facility], [address] [place], [place] [address]    Special Requests   Final    NONE Performed at [facility], [address] [place], [place] [address]    Culture   Final    NO GROWTH Performed at [facility], [place] [address], [place], [place] [address]    Report Status 04/09/2020 FINAL  Final  SARS Coronavirus 2 by RT PCR (hospital order, performed in [facility] hospital lab) Nasopharyngeal Nasopharyngeal Swab     Status: None   Collection Time: 04/08/20  2:51 PM   Specimen: Nasopharyngeal Swab  Result Value Ref Range Status   SARS Coronavirus 2 NEGATIVE NEGATIVE Final    Comment: (NOTE) SARS-CoV-2 target nucleic acids are NOT DETECTED.  The SARS-CoV-2 RNA is generally detectable in upper and lower respiratory specimens during the acute phase of infection. The lowest concentration of SARS-CoV-2 viral copies this assay can detect is 250 copies / mL. A negative result does not preclude SARS-CoV-2 infection and should not be used as the sole basis for treatment or other patient management decisions.  A negative result may occur with improper specimen collection / handling, submission of specimen other than nasopharyngeal swab, presence of viral mutation(s) within the areas targeted by this assay, and inadequate number of viral copies (<250 copies / mL). A negative result must be combined with clinical observations, patient history, and epidemiological  information.  Fact Sheet for Patients:   StrictlyIdeas.no  Fact Sheet for Healthcare Providers: BankingDealers.co.za  This test is not yet approved or  cleared by the [place] FDA and has been authorized for detection and/or diagnosis of SARS-CoV-2 by FDA under an Emergency Use Authorization (EUA).  This EUA will remain in effect (meaning this test can be used) for the duration of the COVID-19 declaration under Section 564(b)(1) of the Act, 21 U.S.C. section 360bbb-3(b)(1), unless the authorization is terminated or revoked sooner.  Performed at [facility], [address] [place], [place] [address]          Radiology Studies: [place] MESENTERIC ARTERIES  Result Date: 04/09/2020 CLINICAL DATA:  Poor appetite, early satiety, nausea and vomiting, abdominal  pain EXAM: [place] MESENTERIC ARTERIAL DOPPLER COMPARISON:  CT 09/07/2018 FINDINGS: Celiac axis: 123-126 cm/sec Celiac axis with inspiration: 124 cm/sec Celiac axis with expiration: 132 cm/sec Splenic artery: 184 cm/sec Hepatic artery: 289 cm/sec SMA: 178-289 cm/sec IMA: 131 cm/sec Aorta: 208 cm/sec Aortic size: 2.6 cm proximally, 2.1 cm in the mid segment and 1.9 cm distally Technologist describes technically difficult study secondary to bowel gas and body habitus. IMPRESSION: No convincing evidence of hemodynamically significant proximal visceral artery stenosis. Electronically Signed   By: Lucrezia Europe M.D.   On: 04/09/2020 11:32   US Abdomen Limited RUQ (LIVER/GB)  Result Date: 04/08/2020 CLINICAL DATA:  Right upper quadrant abdominal pain x3 days. EXAM: ULTRASOUND ABDOMEN LIMITED RIGHT UPPER QUADRANT COMPARISON:  04/08/2020 FINDINGS: Gallbladder: The patient is status post cholecystectomy. Common bile duct: Diameter: 5 mm Liver: Diffuse increased echogenicity with slightly heterogeneous liver. Appearance typically secondary to fatty infiltration. Fibrosis secondary consideration. No  secondary findings of cirrhosis noted. No focal hepatic lesion or intrahepatic biliary duct dilatation. Portal vein is patent on color Doppler imaging with normal direction of blood flow towards the liver. Other: None. IMPRESSION: Status post cholecystectomy.  No acute abnormality detected. Electronically Signed   By: Constance Holster M.D.   On: 04/08/2020 15:53        Scheduled Meds: . amLODipine  5 mg Oral Daily  . carvedilol  12.5 mg Oral BID WC  . cholecalciferol  1,000 Units Oral Daily  . ezetimibe  10 mg Oral Daily  . finasteride  5 mg Oral Daily  . gabapentin  100 mg Oral TID  . insulin aspart  0-9 Units Subcutaneous TID PC & HS  . insulin glargine  30 Units Subcutaneous Daily  . multivitamin with minerals  1 tablet Oral Daily  . pantoprazole (PROTONIX) IV  40 mg Intravenous Q12H  . Ensure Max Protein  11 oz Oral BID   Continuous Infusions:   Assessment & Plan:   Active Problems:   AKI (acute kidney injury) ([place])   1.  Intractable nausea and vomiting  Elevated LFT's-mild Gi following contrast CT and right upper quadrant ultrasound negative for any acute pathology, no evidence of biliary ductal dilatation 2/4-there is evidence of cholestasis with elevated alkaline phosphatase and direct hyperbilirubinemia GGT elevated more than 200 which is consistent with hepatic origin of elevated alkaline phosphatase Acute hepatitis panel negative Mesenteric [place]- negative LFTs trending upward Per GI- mri/mrcp to r/o possible retained stone or any morphologic features of cirrhosis/portal hypertension or venous collaterals Follow-up hepatic serologies If MRI/MRCP is negative for retained stone or acute pathology, he may be discharged home follow-up with office next week for ongoing management He will need to increase his p.o. intake prior to discharge    2.AKI superimposed on  CKD Stage IIIb-likely secondary to dehydration Improving with IV fluids, creatinine 1.30 today Avoid  nephrotoxic medications Encourage p.o. intake so he does not have to be on IV fluids     3.  Thrombocytopenia.-Chronic Platelets mildly down to 70 today Continue to hold aspirin Monitor levels   4.  Essential hypertension. Stable, continue with Coreg and Norvasc  Holding ACE inhibitors due to AKI       5.  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 -BG stable, Decrease Lantus to 30 units since he has decreased p.o. intake, if he continues having decreased p.o. intake we will hold at R-ISS   6.  Dyslipidemia. -hold statin due to elevated LFT  7.  BPH. -Continue Proscar.  8.  DVT prophylaxis. -SCDs. -Medical prophylaxis currently contraindicated due to thrombocytopenia.   DVT prophylaxis: scd Code Status:full Family Communication: none at bedside  Status is: Inpatient  Remains inpatient appropriate because:Inpatient level of care appropriate due to severity of illness   Dispo: The patient is from: Home              Anticipated d/c is to: Home              Anticipated d/c date is:1-2 days              Patient currently is not medically stable to d/c.diagnostic w/u pending. Needs to increase po intake   Difficult to place patient No            LOS: 2 days   Time spent: 35 minutes with more than 50% on [place], MD [facility] Pager [phone]  If 7PM-7AM, please contact night-coverage 04/10/2020, 2:35 PM

## 2020-04-10 NOTE — Progress Notes (Signed)
[name] [name] to be D/C'd home per MD order.  Discussed prescriptions and follow up appointments with the patient. Prescriptions given to patient, medication list explained in detail. Pt verbalized understanding.  Allergies as of 04/10/2020       Reactions   Cortisone Other (See Comments)   Reaction: Leg Cramps   Metformin Diarrhea   Metformin And Related Diarrhea        Medication List     STOP taking these medications    acetaminophen 500 MG tablet Commonly known as: TYLENOL   ciprofloxacin 500 MG tablet Commonly known as: Cipro   furosemide 20 MG tablet Commonly known as: LASIX   omeprazole 20 MG capsule Commonly known as: PRILOSEC   potassium chloride 10 MEQ tablet Commonly known as: KLOR-CON   ramipril 10 MG capsule Commonly known as: ALTACE   rosuvastatin 10 MG tablet Commonly known as: CRESTOR       TAKE these medications    amLODipine 5 MG tablet Commonly known as: NORVASC Take 1 tablet (5 mg total) by mouth daily.   aspirin EC 81 MG tablet Take 81 mg by mouth every other day.   carvedilol 12.5 MG tablet Commonly known as: COREG TAKE ONE (1) TABLET BY MOUTH TWO TIMES PER DAY   ezetimibe 10 MG tablet Commonly known as: ZETIA Take 1 tablet (10 mg total) by mouth daily.   finasteride 5 MG tablet Commonly known as: PROSCAR Take 1 tablet (5 mg total) by mouth daily.   gabapentin 100 MG capsule Commonly known as: NEURONTIN TAKE 1 CAPSULE BY MOUTH 3 TIMES DAILY What changed:  how to take this when to take this   ondansetron 4 MG disintegrating tablet Commonly known as: Zofran ODT Take 1 tablet (4 mg total) by mouth every 8 (eight) hours as needed for nausea or vomiting.   pantoprazole 40 MG tablet Commonly known as: PROTONIX Take 1 tablet (40 mg total) by mouth daily.   Toujeo SoloStar 300 UNIT/ML Solostar Pen Generic drug: insulin glargine (1 Unit Dial) INJECT 50 UNITS DAILY MAY INCREASE 2U EVERY 3 DAYS UNTIL FBG IS AT GOAL UP TO 80  UNITS DAILY   Vitamin D (Cholecalciferol) 25 MCG (1000 UT) Tabs Take 1,000 Units by mouth daily.   zolpidem 5 MG tablet Commonly known as: AMBIEN Take 1 tablet (5 mg total) by mouth at bedtime as needed for sleep. Stop trazadone not helpful        Vitals:   04/10/20 1112 04/10/20 1611  BP: 137/60 (!) 126/95  Pulse: (!) 56 (!) 54  Resp: 18 16  Temp: (!) 97.5 F (36.4 C) 97.7 F (36.5 C)  SpO2: 96% 100%    Skin clean, dry and intact without evidence of skin break down, no evidence of skin tears noted. IV catheter discontinued intact. Site without signs and symptoms of complications. Dressing and pressure applied. Pt denies pain at this time. No complaints noted.  An After Visit Summary was printed and given to the patient. Patient escorted via [place], and D/C home via private auto.  [name] [name]

## 2020-04-10 NOTE — Progress Notes (Signed)
Patient's blood sugar was 216. Nurse tech Almetta Lovely checked blood sugar at 2030 on 04/09/20. The glucometer was docked but never carried over CBG value to patient's chart. Patient given 3 units according to parameters. Will continue to monitor.  Christene Slates

## 2020-04-10 NOTE — Care Management Important Message (Signed)
Important Message  Patient Details  Name: [name] [name] MRN: [id number] Date of Birth: Jul 28, 1933   Medicare Important Message Given:  N/A - LOS <3 / Initial given by admissions  Initial Medicare IM reviewed with patient by Meredith Mody, Patient Access Associate on 04/09/2020 at 9:38am.   Dannette Barbara 04/10/2020, 8:55 AM

## 2020-04-10 NOTE — Progress Notes (Signed)
GI Inpatient Follow-up Note  Subjective:  Patient seen in follow-up for elevated liver function tests, mixed pattern of liver injury. No acute events overnight. Pt reports his nausea and abdominal discomfort have resolved. He has been tolerating a carb modified diet without difficulty. No new complaints. Labs show worsening liver enzymes with AST 112, ALT 91, alk phos 359, tbili 3.5 (was 4.0 yesterday). Renal function improving - Cr 1.3 and BUN 23. His urine is "dark orange" and he had BM this morning which was tan colored.   Scheduled Inpatient Medications:  . amLODipine  5 mg Oral Daily  . carvedilol  12.5 mg Oral BID WC  . cholecalciferol  1,000 Units Oral Daily  . ezetimibe  10 mg Oral Daily  . finasteride  5 mg Oral Daily  . gabapentin  100 mg Oral TID  . insulin aspart  0-9 Units Subcutaneous TID PC & HS  . insulin glargine  30 Units Subcutaneous Daily  . multivitamin with minerals  1 tablet Oral Daily  . pantoprazole (PROTONIX) IV  40 mg Intravenous Q12H  . Ensure Max Protein  11 oz Oral BID    Continuous Inpatient Infusions:    PRN Inpatient Medications:  acetaminophen **OR** acetaminophen, magnesium hydroxide, ondansetron **OR** ondansetron (ZOFRAN) IV, ondansetron, ondansetron, zolpidem  Review of Systems: Constitutional: Weight is stable.  Eyes: No changes in vision. ENT: No oral lesions, sore throat.  GI: see HPI.  Heme/Lymph: No easy bruising.  CV: No chest pain.  GU: No hematuria.  Integumentary: No rashes.  Neuro: No headaches.  Psych: No depression/anxiety.  Endocrine: No heat/cold intolerance.  Allergic/Immunologic: No urticaria.  Resp: No cough, SOB.  Musculoskeletal: No joint swelling.    Physical Examination: BP 137/60 (BP Location: Left Arm)   Pulse (!) 56   Temp (!) 97.5 F (36.4 C) (Oral)   Resp 18   Ht 6' 1"  (1.854 m)   Wt 97.1 kg   SpO2 96%   BMI 28.23 kg/m  Gen: NAD, alert and oriented x 4 HEENT: PEERLA, EOMI, Neck: supple, no JVD or  thyromegaly Chest: CTA bilaterally, no wheezes, crackles, or other adventitious sounds CV: RRR, no m/g/c/r Abd: soft, nondistended, +BS in all four quadrants; tender to deep palpation in RUQ, no HSM, guarding, ridigity, or rebound tenderness Ext: no edema, well perfused with 2+ pulses, Skin: no rash or lesions noted Lymph: no LAD  Data: Lab Results  Component Value Date   WBC 4.6 04/10/2020   HGB 11.4 (L) 04/10/2020   HCT 32.7 (L) 04/10/2020   MCV 90.6 04/10/2020   PLT 70 (L) 04/10/2020   Recent Labs  Lab 04/08/20 1313 04/09/20 0826 04/10/20 0832  HGB 13.3 11.9* 11.4*   Lab Results  Component Value Date   NA 136 04/10/2020   K 3.9 04/10/2020   CL 107 04/10/2020   CO2 20 (L) 04/10/2020   BUN 23 04/10/2020   CREATININE 1.30 (H) 04/10/2020   GLU 294 (H) 12/27/2017   Lab Results  Component Value Date   ALT 91 (H) 04/10/2020   AST 112 (H) 04/10/2020   GGT 203 (H) 04/09/2020   ALKPHOS 359 (H) 04/10/2020   BILITOT 3.5 (H) 04/10/2020   Recent Labs  Lab 04/08/20 2037  INR 1.0   [place] Mesenteric Arteries 04/09/2020: IMPRESSION: No convincing evidence of hemodynamically significant proximal visceral artery stenosis.  RUQ [place] 04/08/2020: IMPRESSION: Status post cholecystectomy.  No acute abnormality detected.  CT abd/pelvis without contrast 04/08/2020: IMPRESSION: 1. No acute abdominal/pelvic findings, mass  lesions or adenopathy. 2. Stable mild splenomegaly. 3. Status post cholecystectomy. No biliary dilatation. 4. Advanced vascular calcifications. 5. Enlarged prostate gland.  Aortic Atherosclerosis (ICD10-I70.0).  Assessment/Plan:  87 y/o Caucasian male with a PMH of CAD s/p CABG x4 2006, HTN, HLD, IDDM, OA, GERD, and BPH presented to the [facility] ED for constellation of symptoms, including fevers, fatigue, right-sided to generalized abdominal pain, nausea, and weakness  1. Elevated LFTs - mild transaminitis and evidence of cholestasis with elevated alkaline  phosphatase and direct hyperbilirubinemia. Acute hepatitis panel negative. GGT elevated >200 which is consistent with hepatic origin of elevated alkaline phosphatase. Alk phos worse today >350 with decrease in tbili from 5-->3.5. His nausea and abdominal pain have resolved, but LFTs are trending up. DDx includes retained bile duct stone, underlying cirrhosis, infiltrative liver disease, or other acute process. Cirrhosis remains on the differential given his chronic thrombocytopenia, hypoalbuminemia, and elevated LFTs; however, ultrasound and noncontrasted CT do not mention any findings consistent with cirrhosis.    2. Nausea without vomiting, RUQ/right-sided abdominal pain - contrasted CT and RUQ ultrasonography negative for any acute pathology, no evidence of biliary ductal dilatation. Nausea and abdominal pain have resolved.   3. Thrombocytopenia - chronic in nature, possibly 2/2 #1 versus chronic liver disease such as cirrhosis.   4. GERD - symptoms stable on home PPI  5. Acute Kidney Injury superimposed on CKD Stage IIIb - improving   COVID-19 Test: NEGATIVE  Recommendations:  - Advise MRI/MRCP to rule out possible retained cirrhosis or any morphologic features of cirrhosis, such as portal hypertension or venous collaterals - Patient does not have leukocytosis or signs of fulminant liver failure - Continue to follow-up on other hepatic serologies - He is tolerating a carb modified diet and nausea/abdominal pain symptoms have resolved.  - If MRI/MRCP is negative for retained stone or acute pathology, he may be discharged home and we will follow-up in our office next week for ongoing management - Further recommendations after MRI/MRCP - Dr. Alice Reichert will see patient this afternoon  Please call with questions or concerns.  *Noted patient is an [facility] patient and last saw Dr. Bonna Gains 05/2019. He was advised he could follow-up with who he would like and he and wife stated they wished  to follow-up with [place], PA-C [facility] [phone] (Cell)

## 2020-04-10 NOTE — Discharge Summary (Signed)
[name] [name] NLZ:[id number] DOB: 1933-11-13 DOA: 04/08/2020  PCP: [name] Haven, MD  Admit date: 04/08/2020 Discharge date: 04/10/2020  Admitted From: home Disposition:  home  Recommendations for Outpatient Follow-up:  1. Follow up with PCP in 1 week 2. Please obtain BMP/CBC in one week 3. Follow up GI  In 3 weeks, Dr. Alice [name]      Discharge Condition:Stable CODE STATUS: Full Diet recommendation: Heart Healthy / Carb Modified  Brief/Interim Summary: [name] [name]  is a 87 y.o. Caucasian male with a known history of multiple medical problems that are mentioned below including hypertension, dyslipidemia, type diabetes mellitus, coronary artery disease status post CABG, osteoarthritis and BPH, who presented to the emergency room with right upper quadrant abdominal pain with associated nausea and vomiting as well as fever with a T-max of one 1.5 for the last 3 to 4 days with associated generalized fatigue and tiredness.  He went to his PCP and had labs on the day of admission which came back abnormal.  He had diminished appetite. Covid test was negative.  1. Intractable nausea and vomiting  Elevated LFT's- contrast CT and right upper quadrant ultrasound negative for any acute pathology, no evidence of biliary ductal dilatation -Per GI there is evidence of cholestasis with elevated alkaline phosphatase and direct hyperbilirubinemia GGT elevated more than 200 which is consistent with hepatic origin of elevated alkaline phosphatase Acute hepatitis panel negative Mesenteric [place]- negative LFTs trending upward Initially patient was going to get MRI/MRCP per GIs recommendation but later they felt patient does not need this.  Since he was tolerating feeding this afternoon and is asymptomatic they would like him to follow-up as outpatient. Will need to follow-up serology as outpatient. 1. Per GI We will follow liver associated enzymes and perform further workup based upon data derived from  repeat bloodwork.       2.AKI superimposed on  CKD Stage IIIb-likely secondary to dehydration Improving Encourage p.o. hydration We will discontinue IV fluids Patient will need to follow-up with PCP for blood work to see if he can resume his Lasix and ramipril    3. Thrombocytopenia.-Chronic Follow-up with PCP as outpatient for monitoring Continue to hold aspirin Monitor levels   4. Essential hypertension. Stable, continue with Coreg and Norvasc  Holding ACE inhibitors due to AKI  repeat blood work as mentioned above      5. 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 Since feeding is improving today resume home dose medications  6. Dyslipidemia. -hold statin due to elevated LFT  7. BPH. -Continue Proscar.     Discharge Diagnoses:  Active Problems:   AKI (acute kidney injury) [facility])    Discharge Instructions  Discharge Instructions    Diet - low sodium heart healthy   Complete by: As directed    Discharge instructions   Complete by: As directed    Follow up with GI in 2-3 weeks Follow up with pcp next week to see if blood work and kidney ok for you to resume lasix and ramipril Dont take crestor (rosuvastatin) until your liver function better   Increase activity slowly   Complete by: As directed      Allergies as of 04/10/2020      Reactions   Cortisone Other (See Comments)   Reaction: Leg Cramps   Metformin Diarrhea   Metformin And Related Diarrhea      Medication List    STOP taking these medications   acetaminophen 500 MG tablet Commonly known as: TYLENOL  ciprofloxacin 500 MG tablet Commonly known as: Cipro   furosemide 20 MG tablet Commonly known as: LASIX   omeprazole 20 MG capsule Commonly known as: PRILOSEC   potassium chloride 10 MEQ tablet Commonly known as: KLOR-CON   ramipril 10 MG capsule Commonly known as: ALTACE   rosuvastatin 10 MG tablet Commonly known as: CRESTOR     TAKE these  medications   amLODipine 5 MG tablet Commonly known as: NORVASC Take 1 tablet (5 mg total) by mouth daily.   aspirin EC 81 MG tablet Take 81 mg by mouth every other day.   carvedilol 12.5 MG tablet Commonly known as: COREG TAKE ONE (1) TABLET BY MOUTH TWO TIMES PER DAY   ezetimibe 10 MG tablet Commonly known as: ZETIA Take 1 tablet (10 mg total) by mouth daily.   finasteride 5 MG tablet Commonly known as: PROSCAR Take 1 tablet (5 mg total) by mouth daily.   gabapentin 100 MG capsule Commonly known as: NEURONTIN TAKE 1 CAPSULE BY MOUTH 3 TIMES DAILY What changed:   how to take this  when to take this   ondansetron 4 MG disintegrating tablet Commonly known as: Zofran ODT Take 1 tablet (4 mg total) by mouth every 8 (eight) hours as needed for nausea or vomiting.   pantoprazole 40 MG tablet Commonly known as: PROTONIX Take 1 tablet (40 mg total) by mouth daily.   Toujeo SoloStar 300 UNIT/ML Solostar Pen Generic drug: insulin glargine (1 Unit Dial) INJECT 50 UNITS DAILY MAY INCREASE 2U EVERY 3 DAYS UNTIL FBG IS AT GOAL UP TO 80 UNITS DAILY   Vitamin D (Cholecalciferol) 25 MCG (1000 UT) Tabs Take 1,000 Units by mouth daily.   zolpidem 5 MG tablet Commonly known as: AMBIEN Take 1 tablet (5 mg total) by mouth at bedtime as needed for sleep. Stop trazadone not helpful       Follow-up Information    [name] Haven, MD Follow up in 1 week(s).   Specialty: Family Medicine Contact information: [address] [place] [address] [phone]        [name] Sella, MD Follow up in 1 week(s).   Specialty: Gastroenterology Contact information: [place] [address] [phone]              Allergies  Allergen Reactions  . Cortisone Other (See Comments)    Reaction: Leg Cramps  . Metformin Diarrhea  . Metformin And Related Diarrhea    Consultations:  GI   Procedures/Studies: CT ABDOMEN PELVIS WO  CONTRAST  Result Date: 04/08/2020 CLINICAL DATA:  Abdominal pain and abnormal liver function studies. EXAM: CT ABDOMEN AND PELVIS WITHOUT CONTRAST TECHNIQUE: Multidetector CT imaging of the abdomen and pelvis was performed following the standard protocol without IV contrast. COMPARISON:  09/07/2018 FINDINGS: Lower chest: The lung bases are clear of acute process. No pleural effusion or pulmonary lesions. The heart is normal in size. No pericardial effusion. There are stable coronary artery and aortic calcifications. The distal esophagus and aorta are unremarkable. Hepatobiliary: No hepatic lesions are identified without contrast. No intrahepatic biliary dilatation. The gallbladder is surgically absent. No common bile duct dilatation. Pancreas: No mass, inflammation or ductal dilatation. Spleen: Mild stable splenomegaly.  No splenic lesions. Adrenals/Urinary Tract: The adrenal glands are normal. Mild age related renal cortical thinning and small stable renal cysts. No renal calculi, obstructing ureteral calculi or bladder mass or calculi. Stomach/Bowel: The stomach, duodenum, small bowel and colon are grossly normal. No acute  inflammatory changes, mass lesions or obstructive findings. The terminal ileum is normal. The appendix is normal. Vascular/Lymphatic: Advanced vascular calcifications but no aneurysm. No mesenteric or retroperitoneal mass or adenopathy. Reproductive: Enlarged prostate gland. The seminal vesicles are unremarkable. Other: No pelvic mass or adenopathy. No free pelvic fluid collections. No inguinal mass or adenopathy. No abdominal wall hernia or subcutaneous lesions. Musculoskeletal: No significant bony findings. Stable advanced degenerative changes involving the spine. A right hip prosthesis is again noted. IMPRESSION: 1. No acute abdominal/pelvic findings, mass lesions or adenopathy. 2. Stable mild splenomegaly. 3. Status post cholecystectomy. No biliary dilatation. 4. Advanced vascular  calcifications. 5. Enlarged prostate gland. Aortic Atherosclerosis (ICD10-I70.0). Electronically Signed   By: Marijo Sanes M.D.   On: 04/08/2020 14:40   [place] MESENTERIC ARTERIES  Result Date: 04/09/2020 CLINICAL DATA:  Poor appetite, early satiety, nausea and vomiting, abdominal pain EXAM: [place] MESENTERIC ARTERIAL DOPPLER COMPARISON:  CT 09/07/2018 FINDINGS: Celiac axis: 123-126 cm/sec Celiac axis with inspiration: 124 cm/sec Celiac axis with expiration: 132 cm/sec Splenic artery: 184 cm/sec Hepatic artery: 289 cm/sec SMA: 178-289 cm/sec IMA: 131 cm/sec Aorta: 208 cm/sec Aortic size: 2.6 cm proximally, 2.1 cm in the mid segment and 1.9 cm distally Technologist describes technically difficult study secondary to bowel gas and body habitus. IMPRESSION: No convincing evidence of hemodynamically significant proximal visceral artery stenosis. Electronically Signed   By: Lucrezia Europe M.D.   On: 04/09/2020 11:32   US Abdomen Limited RUQ (LIVER/GB)  Result Date: 04/08/2020 CLINICAL DATA:  Right upper quadrant abdominal pain x3 days. EXAM: ULTRASOUND ABDOMEN LIMITED RIGHT UPPER QUADRANT COMPARISON:  04/08/2020 FINDINGS: Gallbladder: The patient is status post cholecystectomy. Common bile duct: Diameter: 5 mm Liver: Diffuse increased echogenicity with slightly heterogeneous liver. Appearance typically secondary to fatty infiltration. Fibrosis secondary consideration. No secondary findings of cirrhosis noted. No focal hepatic lesion or intrahepatic biliary duct dilatation. Portal vein is patent on color Doppler imaging with normal direction of blood flow towards the liver. Other: None. IMPRESSION: Status post cholecystectomy.  No acute abnormality detected. Electronically Signed   By: Constance Holster M.D.   On: 04/08/2020 15:53       Subjective: No n/v . Tolerated his food  Today.  Discharge Exam: Vitals:   04/10/20 1112 04/10/20 1611  BP: 137/60 (!) 126/95  Pulse: (!) 56 (!) 54  Resp: 18 16  Temp: (!) 97.5  F (36.4 C) 97.7 F (36.5 C)  SpO2: 96% 100%   Vitals:   04/10/20 0450 04/10/20 0800 04/10/20 1112 04/10/20 1611  BP: (!) 143/48 (!) 134/51 137/60 (!) 126/95  Pulse: 61 61 (!) 56 (!) 54  Resp: 20 20 18 16   Temp: 97.7 F (36.5 C) 98.5 F (36.9 C) (!) 97.5 F (36.4 C) 97.7 F (36.5 C)  TempSrc: Oral Oral Oral   SpO2: 95% 97% 96% 100%  Weight:      Height:        General: Pt is alert, awake, not in acute distress Cardiovascular: RRR, S1/S2 +, no rubs, no gallops Respiratory: CTA bilaterally, no wheezing, no rhonchi Abdominal: Soft, NT, ND, bowel sounds + Extremities: no edema, no cyanosis    The results of significant diagnostics from this hospitalization (including imaging, microbiology, ancillary and laboratory) are listed below for reference.     Microbiology: Recent Results (from the past 240 hour(s))  Urine Culture     Status: None   Collection Time: 04/08/20 11:47 AM   Specimen: Urine, Random  Result Value Ref Range Status  Specimen Description   Final    URINE, RANDOM Performed at [facility], [address] [place], [place] [address]    Special Requests   Final    NONE Performed at [facility], [address] [place], [place] [address]    Culture   Final    NO GROWTH Performed at [facility], [place] [address], [place], [place] [address]    Report Status 04/09/2020 FINAL  Final  SARS Coronavirus 2 by RT PCR (hospital order, performed in [facility] hospital lab) Nasopharyngeal Nasopharyngeal Swab     Status: None   Collection Time: 04/08/20  2:51 PM   Specimen: Nasopharyngeal Swab  Result Value Ref Range Status   SARS Coronavirus 2 NEGATIVE NEGATIVE Final    Comment: (NOTE) SARS-CoV-2 target nucleic acids are NOT DETECTED.  The SARS-CoV-2 RNA is generally detectable in upper and lower respiratory specimens during the acute phase of infection. The lowest concentration of SARS-CoV-2 viral copies this assay can detect is  250 copies / mL. A negative result does not preclude SARS-CoV-2 infection and should not be used as the sole basis for treatment or other patient management decisions.  A negative result may occur with improper specimen collection / handling, submission of specimen other than nasopharyngeal swab, presence of viral mutation(s) within the areas targeted by this assay, and inadequate number of viral copies (<250 copies / mL). A negative result must be combined with clinical observations, patient history, and epidemiological information.  Fact Sheet for Patients:   StrictlyIdeas.no  Fact Sheet for Healthcare Providers: BankingDealers.co.za  This test is not yet approved or  cleared by the [place] FDA and has been authorized for detection and/or diagnosis of SARS-CoV-2 by FDA under an Emergency Use Authorization (EUA).  This EUA will remain in effect (meaning this test can be used) for the duration of the COVID-19 declaration under Section 564(b)(1) of the Act, 21 U.S.C. section 360bbb-3(b)(1), unless the authorization is terminated or revoked sooner.  Performed at [facility], [address] [place], [place] [address]      Labs: BNP (last 3 results) No results for input(s): BNP in the last 8760 hours. Basic Metabolic Panel: Recent Labs  Lab 04/08/20 1154 04/08/20 1313 04/09/20 0826 04/10/20 0832  NA 132* 133* 138 136  K 4.1 4.3 3.8 3.9  CL 100 102 107 107  CO2 23 21* 23 20*  GLUCOSE 371* 366* 123* 242*  BUN 33* 32* 26* 23  CREATININE 1.95* 1.82* 1.58* 1.30*  CALCIUM 8.5* 8.6* 8.6* 8.4*   Liver Function Tests: Recent Labs  Lab 04/08/20 1154 04/08/20 1313 04/08/20 1452 04/09/20 0826 04/10/20 0832  AST 53* 54*  --  46* 112*  ALT 100* 99*  --  72* 91*  ALKPHOS 226* 227*  --  253* 359*  BILITOT 5.1* 5.0* 4.5* 4.0* 3.5*  PROT 6.2* 6.1*  --  5.5* 5.3*  ALBUMIN 3.3* 3.3*  --  2.9* 2.7*   Recent Labs   Lab 04/08/20 1313  LIPASE 26   No results for input(s): AMMONIA in the last 168 hours. CBC: Recent Labs  Lab 04/08/20 1154 04/08/20 1313 04/09/20 0826 04/10/20 0832  WBC 6.6 6.3 6.3 4.6  NEUTROABS 5.1  --   --   --   HGB 13.0 13.3 11.9* 11.4*  HCT 36.5* 36.4* 33.2* 32.7*  MCV 90.6 89.2 90.0 90.6  PLT 82* 79* 83* 70*   Cardiac Enzymes: No results for input(s): CKTOTAL, CKMB, CKMBINDEX,  TROPONINI in the last 168 hours. BNP: Invalid input(s): POCBNP CBG: Recent Labs  Lab 04/09/20 1635 04/09/20 1753 04/09/20 2030 04/10/20 0737 04/10/20 1113  GLUCAP 63* 105* 216* 225* 228*   D-Dimer No results for input(s): DDIMER in the last 72 hours. Hgb A1c Recent Labs    04/08/20 1313  HGBA1C 7.8*   Lipid Profile No results for input(s): CHOL, HDL, LDLCALC, TRIG, CHOLHDL, LDLDIRECT in the last 72 hours. Thyroid function studies No results for input(s): TSH, T4TOTAL, T3FREE, THYROIDAB in the last 72 hours.  Invalid input(s): FREET3 Anemia work up No results for input(s): VITAMINB12, FOLATE, FERRITIN, TIBC, IRON, RETICCTPCT in the last 72 hours. Urinalysis    Component Value Date/Time   COLORURINE AMBER (A) 04/08/2020 1823   APPEARANCEUR HAZY (A) 04/08/2020 1823   APPEARANCEUR Clear 12/27/2017 0943   LABSPEC 1.012 04/08/2020 1823   LABSPEC 1.018 02/22/2013 2122   PHURINE 5.0 04/08/2020 1823   GLUCOSEU 150 (A) 04/08/2020 1823   GLUCOSEU Negative 02/22/2013 2122   HGBUR NEGATIVE 04/08/2020 1823   BILIRUBINUR NEGATIVE 04/08/2020 1823   BILIRUBINUR small 12/07/2018 1118   BILIRUBINUR Negative 12/27/2017 0943   BILIRUBINUR Negative 02/22/2013 2122   KETONESUR NEGATIVE 04/08/2020 1823   PROTEINUR NEGATIVE 04/08/2020 1823   UROBILINOGEN 0.2 12/07/2018 1118   UROBILINOGEN 0.2 06/03/2013 2218   NITRITE NEGATIVE 04/08/2020 1823   LEUKOCYTESUR NEGATIVE 04/08/2020 1823   LEUKOCYTESUR Negative 02/22/2013 2122   Sepsis Labs Invalid input(s): PROCALCITONIN,  WBC,   LACTICIDVEN Microbiology Recent Results (from the past 240 hour(s))  Urine Culture     Status: None   Collection Time: 04/08/20 11:47 AM   Specimen: Urine, Random  Result Value Ref Range Status   Specimen Description   Final    URINE, RANDOM Performed at [facility], [address] [place], [place] [address]    Special Requests   Final    NONE Performed at [facility], [address] [place], [place] [address]    Culture   Final    NO GROWTH Performed at [facility], [place] [address], [place],  [address]    Report Status 04/09/2020 FINAL  Final  SARS Coronavirus 2 by RT PCR (hospital order, performed in [facility] hospital lab) Nasopharyngeal Nasopharyngeal Swab     Status: None   Collection Time: 04/08/20  2:51 PM   Specimen: Nasopharyngeal Swab  Result Value Ref Range Status   SARS Coronavirus 2 NEGATIVE NEGATIVE Final    Comment: (NOTE) SARS-CoV-2 target nucleic acids are NOT DETECTED.  The SARS-CoV-2 RNA is generally detectable in upper and lower respiratory specimens during the acute phase of infection. The lowest concentration of SARS-CoV-2 viral copies this assay can detect is 250 copies / mL. A negative result does not preclude SARS-CoV-2 infection and should not be used as the sole basis for treatment or other patient management decisions.  A negative result may occur with improper specimen collection / handling, submission of specimen other than nasopharyngeal swab, presence of viral mutation(s) within the areas targeted by this assay, and inadequate number of viral copies (<250 copies / mL). A negative result must be combined with clinical observations, patient history, and epidemiological information.  Fact Sheet for Patients:   StrictlyIdeas.no  Fact Sheet for Healthcare Providers: BankingDealers.co.za  This test is not yet approved or  cleared by the [place] FDA  and has been authorized for detection and/or diagnosis of SARS-CoV-2 by FDA under an Emergency Use Authorization (EUA).  This  EUA will remain in effect (meaning this test can be used) for the duration of the COVID-19 declaration under Section 564(b)(1) of the Act, 21 U.S.C. section 360bbb-3(b)(1), unless the authorization is terminated or revoked sooner.  Performed at [facility], [address], [place], [place] [address]      Time coordinating discharge: Over 30 minutes  SIGNED:   Nolberto Hanlon, MD  [facility] 04/10/2020, 5:35 PM Pager   If 7PM-7AM, please contact night-coverage [email or web address] Password [email or web address]

## 2020-04-13 ENCOUNTER — Telehealth: Payer: Self-pay

## 2020-04-13 ENCOUNTER — Other Ambulatory Visit: Payer: Self-pay

## 2020-04-13 ENCOUNTER — Telehealth: Payer: Self-pay | Admitting: Family Medicine

## 2020-04-13 DIAGNOSIS — R945 Abnormal results of liver function studies: Secondary | ICD-10-CM

## 2020-04-13 DIAGNOSIS — D696 Thrombocytopenia, unspecified: Secondary | ICD-10-CM

## 2020-04-13 DIAGNOSIS — R7989 Other specified abnormal findings of blood chemistry: Secondary | ICD-10-CM

## 2020-04-13 LAB — ALKALINE PHOSPHATASE, ISOENZYMES
Alk Phos Bone Fract: 34 % (ref 12–68)
Alk Phos Liver Fract: 66 % (ref 13–88)
Alk Phos: 322 IU/L — ABNORMAL HIGH (ref 44–121)
Intestinal %: 0 % (ref 0–18)

## 2020-04-13 LAB — GLUCOSE, CAPILLARY: Glucose-Capillary: 310 mg/dL — ABNORMAL HIGH (ref 70–99)

## 2020-04-13 NOTE — Telephone Encounter (Signed)
Transition Care Management Follow-up Telephone Call  Date of discharge and from where: 04/10/20 from [facility]  How have you been since you were released from the hospital? Patient states,"I am okay." Denies pain, n/v/d, fever, fatigue, diminished appetite and all other symptoms.    Any questions or concerns? "Yes, I would like to come in for labs, awaiting call back from pcp." (See previous note with admin).   Items Reviewed:  Did the pt receive and understand the discharge instructions provided? Yes   Medications obtained and verified? Yes . Holding medications as directed per discharge. Taing all other scheduled medications without issues.   Other? No   Any new allergies since your discharge? No   Dietary orders reviewed? Heart Healthy/Carb Modified  Do you have support at home? Yes   Home Care and Equipment/Supplies: Were home health services ordered? No Were any new equipment or medical supplies ordered? No   Functional Questionnaire: (I = Independent and D = Dependent) ADLs: I  Follow up appointments reviewed:   PCP Hospital f/u appt confirmed? No  Not yet scheduled with pcp. Awaiting feedback on follow up appointment per earlier phone note. See chart to review.   [facility] f/u appt confirmed? Gastroenterology contact phone number provided by nurse.   Are transportation arrangements needed? No   If their condition worsens, is the pt aware to call PCP or go to the Emergency Dept.? Yes  Was the patient provided with contact information for the PCP's office or ED? Yes  Was to pt encouraged to call back with questions or concerns? Yes

## 2020-04-13 NOTE — Telephone Encounter (Signed)
Patient called in for a hosp f/u but patient had cough and cold symptoms did not want a virtual appt stated that he wanted blood work done

## 2020-04-13 NOTE — Telephone Encounter (Signed)
Reviewed

## 2020-04-14 NOTE — Telephone Encounter (Signed)
Patient called in for a hosp f/u but patient had cough and cold symptoms did not want a virtual appt stated that he wanted blood work done. Bonnetta Allbee,cma

## 2020-04-15 ENCOUNTER — Ambulatory Visit: Payer: Medicare Other | Admitting: Family Medicine

## 2020-04-15 ENCOUNTER — Other Ambulatory Visit
Admission: RE | Admit: 2020-04-15 | Discharge: 2020-04-15 | Disposition: A | Discharge status: Home / Self Care | Payer: Medicare Other | Attending: Family Medicine | Admitting: Family Medicine

## 2020-04-15 DIAGNOSIS — R7989 Other specified abnormal findings of blood chemistry: Secondary | ICD-10-CM

## 2020-04-15 DIAGNOSIS — R945 Abnormal results of liver function studies: Secondary | ICD-10-CM | POA: Diagnosis not present

## 2020-04-15 DIAGNOSIS — D696 Thrombocytopenia, unspecified: Secondary | ICD-10-CM | POA: Diagnosis not present

## 2020-04-15 LAB — COMPREHENSIVE METABOLIC PANEL
ALT: 52 U/L — ABNORMAL HIGH (ref 0–44)
AST: 40 U/L (ref 15–41)
Albumin: 3.1 g/dL — ABNORMAL LOW (ref 3.5–5.0)
Alkaline Phosphatase: 398 U/L — ABNORMAL HIGH (ref 38–126)
Anion gap: 7 (ref 5–15)
BUN: 14 mg/dL (ref 8–23)
CO2: 21 mmol/L — ABNORMAL LOW (ref 22–32)
Calcium: 8.4 mg/dL — ABNORMAL LOW (ref 8.9–10.3)
Chloride: 103 mmol/L (ref 98–111)
Creatinine, Ser: 1.29 mg/dL — ABNORMAL HIGH (ref 0.61–1.24)
GFR, Estimated: 55 mL/min — ABNORMAL LOW (ref >60)
Glucose, Bld: 278 mg/dL — ABNORMAL HIGH (ref 70–99)
Potassium: 3.8 mmol/L (ref 3.5–5.1)
Sodium: 131 mmol/L — ABNORMAL LOW (ref 135–145)
Total Bilirubin: 2.9 mg/dL — ABNORMAL HIGH (ref 0.3–1.2)
Total Protein: 6.2 g/dL — ABNORMAL LOW (ref 6.5–8.1)

## 2020-04-15 LAB — CBC WITH DIFFERENTIAL/PLATELET
Abs Immature Granulocytes: 0.04 10*3/uL (ref 0.00–0.07)
Basophils Absolute: 0 10*3/uL (ref 0.0–0.1)
Basophils Relative: 1 %
Eosinophils Absolute: 0.2 10*3/uL (ref 0.0–0.5)
Eosinophils Relative: 3 %
HCT: 34.1 % — ABNORMAL LOW (ref 39.0–52.0)
Hemoglobin: 11.7 g/dL — ABNORMAL LOW (ref 13.0–17.0)
Immature Granulocytes: 1 %
Lymphocytes Relative: 19 %
Lymphs Abs: 1.2 10*3/uL (ref 0.7–4.0)
MCH: 32.1 pg (ref 26.0–34.0)
MCHC: 34.3 g/dL (ref 30.0–36.0)
MCV: 93.4 fL (ref 80.0–100.0)
Monocytes Absolute: 0.6 10*3/uL (ref 0.1–1.0)
Monocytes Relative: 9 %
Neutro Abs: 4.4 10*3/uL (ref 1.7–7.7)
Neutrophils Relative %: 67 %
Platelets: 163 10*3/uL (ref 150–400)
RBC: 3.65 MIL/uL — ABNORMAL LOW (ref 4.22–5.81)
RDW: 12.8 % (ref 11.5–15.5)
WBC: 6.5 10*3/uL (ref 4.0–10.5)
nRBC: 0 % (ref 0.0–0.2)

## 2020-04-15 NOTE — Telephone Encounter (Signed)
Labs ordered.

## 2020-04-15 NOTE — Telephone Encounter (Signed)
Please find out how long he has had the cold symptoms for. Has he had a COVID test? Would he be willing to be tested if he has not been tested? I can order labs though they will have to be done at the lab at the hospital.

## 2020-04-15 NOTE — Telephone Encounter (Signed)
I called and spoke with the patient and he took a home test and it was negative, he has cough, and chest congestion and he is going to Urgent care this morning for his symptoms he did not want to test here after I offered but he stated he needed his liver enzymes checked because it was up in the hospital.  He is wiling to go to the hospital for labs today.  [name] [name],cma

## 2020-04-23 ENCOUNTER — Other Ambulatory Visit: Payer: Self-pay | Admitting: Family Medicine

## 2020-04-26 NOTE — Progress Notes (Signed)
I connected with  [name] [name] on 04/27/20   Evaluation Performed:  Follow-up visit  Date:  04/27/2020   ID:  Hicks, Feick 25-Nov-1933, MRN [id number]  Patient Location:  [address] [address]   Provider location:   [facility], [place] office  PCP:  [name], MD  Cardiologist:  Arvid Right [facility]   Chief Complaint  Patient presents with  . Follow-up    6 month F/U-Patient c/o SOB which he states has been worsening over time.    History of Present Illness:    [name] [name] is a 87 y.o. male  past medical history of coronary artery disease, bypass surgery in November 2006, Stress test March 2017, cath 09/2015, medical management Occluded proximal LAD  Critical/severe native RCA disease  Vein graft to the diagonal is occluded  Other vein grafts patent to the RCA/PDA, vein graft to the OM 3, LIMA graft  EF 55% in 12/2013 diabetes, numbers running high obesity,  hyperlipidemia,  hypertension,  erectile dysfunction Chronic SOB, nonsmoker Postherpetic neuralgia left flank Stable angina, chronic diastolic CHF, chronic shortness of breath Pulmonary hypertension on right heart catheterization who presents for routine followup of his coronary artery disease, and hypertension  Last see in clinic 10/2019 Wife presents with him today  Prior echocardiogram in heart catheterization November 2019 Moderate pulmonary hypertension, Severe three-vessel disease Occluded proximal RCA, occluded ostial LAD LIMA graft to the LAD patent, vein graft to the PDA patent The findings this catheterization shows occluded vein graft to the OM 3, now occluded proximal native RCA  In the hospital February 2022 right upper quadrant abdominal pain with associated nausea and vomiting as well as feverwith a T-max of one 1.44for the last 3 to 4 days with associated generalized fatigue and tiredness.   cholestasis with elevated  alkaline phosphatase and direct hyperbilirubinemia GGT elevated more than 200 which is consistent with hepatic origin of elevated alkaline phosphatase Acute hepatitis panel negative  IVF in the hospital Weight normally 213, out of hospital , weight up to 221 Restarted lasix 20, weight down 10 pounds Has stopped lasix for weight <110  Biked 30 min today worsening SOB he feels  Chronic SOB Stable angina  EKG personally reviewed by myself on todays visit Shows normal sinus rhythm rate 68 bpm no significant ST-T wave changes  Other past medical history reviewed Last cath 01/2018 Graft to the right patent, native left circumflex patent, LIMA graft to the LAD patent.  Native RCA is occluded, vein graft to left circumflex occluded, vein graft to the diagonal occluded --Moderate pulmonary hypertension by right heart pressures  History of Bell's palsy, Left side of face March 2019  2 days after doing cataract surgery Completed prednisone and antivirals Improved sx, still difficulty smoking, right facial weakness  Seen in the past by specialist for his back in [place], had MRI showing arthritis no spinal stenosis  CT scan of his abdomen reviewed showing mild diffuse aortic atherosclerosis  Echocardiogram April 2009 shows normal systolic function with mild LVH, diastolic relaxation abnormality, mildly enlarged left atrium, mild aortic insufficiency.  Cardiac catheterization January 05, 2018  Echocardiogram January 08, 2018 Left ventricle: The cavity size was normal. There was mild concentric hypertrophy. Systolic function was normal. The estimated ejection fraction was in the range of 55% to 60%. Wall motion was normal; there were no regional wall motion abnormalities. Features are consistent with a pseudonormal left ventricular  filling pattern, with concomitant abnormal relaxation and increased filling pressure (grade 2 diastolic dysfunction). - Aortic valve: There  was trivial regurgitation. - Mitral valve: Calcified annulus. - Left atrium: The atrium was mildly dilated. - Pulmonary arteries: Systolic pressure was mildly increased.  Past Medical History:  Diagnosis Date  . BPH (benign prostatic hyperplasia)   . Cancer ([place])    skin  . Cataract   . Coronary artery disease    a. 4v CABG 01/2005; b. cath 06/05/13 showed patent grafts: LIMA-LAD, VG-D, VG-OM3, VG-dRCA, nl filling pressures, nl LV fxn  . Diastolic dysfunction    a. echo 08/4330: nl systolic fxn, mild LVH, diastolic relaxation abnormality, mildly enlarged LA, mild Ao insufficiency  . Dyspnea    with exertion  . ED (erectile dysfunction)   . GERD (gastroesophageal reflux disease)   . Hyperlipidemia   . Hypertension   . IDDM (insulin dependent diabetes mellitus) 2000  . Osteoarthritis   . Perirectal fistula   . Pneumonia 12/2016  . Shingles 09/04/2018  . Tubular adenoma of colon 07/2012  . Vertigo    Past Surgical History:  Procedure Laterality Date  . BACK SURGERY    . CARDIAC CATHETERIZATION  06/05/2013   [facility].   Marland Kitchen CARDIAC CATHETERIZATION N/A 09/24/2015   Procedure: Right Heart Cath and Coronary/Graft Angiography;  Surgeon: Minna Merritts, MD;  Location: [facility];  Service: Cardiovascular;  Laterality: N/A;  . CATARACT EXTRACTION  sept 2013   right  . CATARACT EXTRACTION W/PHACO Left 03/28/2017   Procedure: CATARACT EXTRACTION PHACO AND INTRAOCULAR LENS PLACEMENT (IOC);  Surgeon: Birder Robson, MD;  Location: [facility] OR[facility];  Service: Ophthalmology;  Laterality: Left;  [place] 00:42.0 AP% 15.0 CDE 6.31 Fluid Pack lot # [id number]  . CHOLECYSTECTOMY  05/15/2011   Procedure: LAPAROSCOPIC CHOLECYSTECTOMY;  Surgeon: Rolm Bookbinder, MD;  Location: [facility];  Service: General;  Laterality: N/A;  . COLONOSCOPY W/ POLYPECTOMY    . CORONARY ARTERY BYPASS GRAFT  2007   x 4  . EYE SURGERY    . FOOT SURGERY  2012   right foot  . JOINT REPLACEMENT  8/10   Right THR--[name]   . LEFT AND RIGHT HEART CATHETERIZATION WITH CORONARY/GRAFT ANGIOGRAM N/A 06/05/2013   Procedure: LEFT AND RIGHT HEART CATHETERIZATION WITH Beatrix Fetters;  Surgeon: Peter M Martinique, MD;  Location: [facility] CATH LAB;  Service: Cardiovascular;  Laterality: N/A;  . LUMBAR LAMINECTOMY  1989  . Prostate photovaporization  5/16   Dr Budd Palmer  . RIGHT/LEFT HEART CATH AND CORONARY/GRAFT ANGIOGRAPHY N/A 01/05/2018   Procedure: RIGHT/LEFT HEART CATH AND CORONARY/GRAFT ANGIOGRAPHY;  Surgeon: Minna Merritts, MD;  Location: [facility] [facility];  Service: Cardiovascular;  Laterality: N/A;     Current Meds  Medication Sig  . amLODipine (NORVASC) 5 MG tablet Take 1 tablet (5 mg total) by mouth daily.  Marland Kitchen aspirin EC 81 MG tablet Take 81 mg by mouth every other day.  . carvedilol (COREG) 12.5 MG tablet TAKE ONE (1) TABLET BY MOUTH TWO TIMES PER DAY  . ezetimibe (ZETIA) 10 MG tablet Take 1 tablet (10 mg total) by mouth daily.  . finasteride (PROSCAR) 5 MG tablet Take 1 tablet (5 mg total) by mouth daily.  Marland Kitchen gabapentin (NEURONTIN) 100 MG capsule Take 100 mg by mouth 2 (two) times daily.  Marland Kitchen omeprazole (PRILOSEC) 20 MG capsule TAKE 1 CAPSULE BY MOUTH TWICE DAILY  . ondansetron (ZOFRAN ODT) 4 MG disintegrating tablet Take 1 tablet (4 mg total) by mouth  every 8 (eight) hours as needed for nausea or vomiting.  Marland Kitchen TOUJEO SOLOSTAR 300 UNIT/ML Solostar Pen INJECT 50 UNITS DAILY MAY INCREASE 2U EVERY 3 DAYS UNTIL FBG IS AT GOAL UP TO 80 UNITS DAILY  . Vitamin D, Cholecalciferol, 1000 units TABS Take 1,000 Units by mouth daily.   Marland Kitchen zolpidem (AMBIEN) 5 MG tablet Take 1 tablet (5 mg total) by mouth at bedtime as needed for sleep. Stop trazadone not helpful     Allergies:   Cortisone, Metformin, and Metformin and related   Social History   Tobacco Use  . Smoking status: Never Smoker  . Smokeless tobacco: Never Used  Vaping Use  . Vaping Use: Never used  Substance Use Topics  . Alcohol use: Yes    Comment:  occ cocktail  . Drug use: No     Family Hx: The patient's family history includes Heart disease in his father; Lung cancer in his mother. There is no history of Colon cancer or Breast cancer.  ROS:   Please see the history of present illness.    Review of Systems  Constitutional: Negative.   HENT: Negative.   Respiratory: Positive for shortness of breath.   Cardiovascular: Positive for leg swelling.  Gastrointestinal: Negative.   Musculoskeletal: Negative.   Neurological: Negative.   Psychiatric/Behavioral: Negative.   All other systems reviewed and are negative.    Labs/Other Tests and Data Reviewed:    Recent Labs: 04/15/2020: ALT 52; BUN 14; Creatinine, Ser 1.29; Hemoglobin 11.7; Platelets 163; Potassium 3.8; Sodium 131   Recent Lipid Panel Lab Results  Component Value Date/Time   CHOL 93 03/11/2020 03:25 PM   CHOL 125 12/27/2017 02:46 PM   TRIG 163.0 (H) 03/11/2020 03:25 PM   TRIG 289 (H) 12/27/2017 02:46 PM   HDL 37.60 (L) 03/11/2020 03:25 PM   HDL 39 (L) 07/04/2014 08:01 AM   CHOLHDL 2 03/11/2020 03:25 PM   LDLCALC 23 03/11/2020 03:25 PM   LDLCALC 110 (H) 07/04/2014 08:01 AM   LDLDIRECT 65.0 06/02/2016 02:17 PM    Wt Readings from Last 3 Encounters:  04/27/20 213 lb (96.6 kg)  04/27/20 212 lb (96.2 kg)  04/08/20 214 lb (97.1 kg)     Exam:    BP (!) 150/72 (BP Location: Left Arm, Patient Position: Sitting, Cuff Size: Normal)   Pulse 68   Ht 6\' 1"  (1.854 m)   Wt 213 lb (96.6 kg)   SpO2 97%   BMI 28.10 kg/m   Constitutional:  oriented to person, place, and time. No distress.  HENT:  Head: Grossly normal Eyes:  no discharge. No scleral icterus.  Neck: No JVD, no carotid bruits  Cardiovascular: Regular rate and rhythm, no murmurs appreciated Pulmonary/Chest: Clear to auscultation bilaterally, no wheezes or rails Abdominal: Soft.  no distension.  no tenderness.  Musculoskeletal: Normal range of motion Neurological:  normal muscle tone. Coordination  normal. No atrophy Skin: Skin warm and dry Psychiatric: normal affect, pleasant   ASSESSMENT & PLAN:    Pulmonary hypertension Previous PFTs, obstructive disease,  Chronic mild shortness of breath Continue Lasix 20 mg daily for weight over 210 pounds  Coronary artery disease of native artery of native heart with stable angina pectoris (HCC) Chronic stable angina, Recommended we try to reenroll him in cardiac rehab Continued shortness of breath, wife are concerned with his conditioning  Chronic diastolic CHF (congestive heart failure) (HCC) Continue Lasix 20 daily Recommend he moderate his fluid intake, Low-salt, weight loss  Type 2 diabetes  mellitus with complication, without long-term current use of insulin ([place]) Suggested cardiac rehab as above, weight loss, strict low carbohydrate diet  Mixed hyperlipidemia Cholesterol is at goal on the current lipid regimen. No changes to the medications were made.  Essential hypertension Blood pressure elevated today, he will check at home May need to restart his ramipril  Stable angina ([place]) Continue current medications  S/P CABG x 4 Prior catheterization in the 2019   CKD (chronic kidney disease) stage 3, GFR 30-59 ml/min (HCC) Lasix daily,  Hold for weight less than 210     Total encounter time more than 25 minutes  Greater than 50% was spent in counseling and coordination of care with the patient    Signed, Ida Rogue, MD  04/27/2020 2:58 PM    [facility] [address], [place], [place] [address]

## 2020-04-27 ENCOUNTER — Ambulatory Visit: Payer: Medicare Other | Admitting: Gastroenterology

## 2020-04-27 ENCOUNTER — Encounter: Payer: Self-pay | Admitting: Cardiovascular Disease

## 2020-04-27 ENCOUNTER — Other Ambulatory Visit: Payer: Self-pay

## 2020-04-27 ENCOUNTER — Encounter: Payer: Self-pay | Admitting: Gastroenterology

## 2020-04-27 ENCOUNTER — Other Ambulatory Visit: Payer: Self-pay | Admitting: *Deleted

## 2020-04-27 ENCOUNTER — Ambulatory Visit: Payer: Medicare Other | Admitting: Cardiovascular Disease

## 2020-04-27 VITALS — BP 150/72 | HR 68 | Ht 73.0 in | Wt 213.0 lb

## 2020-04-27 VITALS — BP 158/74 | HR 76 | Temp 97.4°F | Wt 212.0 lb

## 2020-04-27 DIAGNOSIS — R0602 Shortness of breath: Secondary | ICD-10-CM

## 2020-04-27 DIAGNOSIS — R748 Abnormal levels of other serum enzymes: Secondary | ICD-10-CM | POA: Diagnosis not present

## 2020-04-27 DIAGNOSIS — I25118 Atherosclerotic heart disease of native coronary artery with other forms of angina pectoris: Secondary | ICD-10-CM

## 2020-04-27 DIAGNOSIS — Z951 Presence of aortocoronary bypass graft: Secondary | ICD-10-CM

## 2020-04-27 DIAGNOSIS — I5032 Chronic diastolic (congestive) heart failure: Secondary | ICD-10-CM

## 2020-04-27 DIAGNOSIS — I10 Essential (primary) hypertension: Secondary | ICD-10-CM | POA: Diagnosis not present

## 2020-04-27 DIAGNOSIS — I208 Other forms of angina pectoris: Secondary | ICD-10-CM

## 2020-04-27 DIAGNOSIS — N1831 Chronic kidney disease, stage 3a: Secondary | ICD-10-CM | POA: Diagnosis not present

## 2020-04-27 DIAGNOSIS — E782 Mixed hyperlipidemia: Secondary | ICD-10-CM

## 2020-04-27 DIAGNOSIS — E118 Type 2 diabetes mellitus with unspecified complications: Secondary | ICD-10-CM

## 2020-04-27 NOTE — Patient Instructions (Addendum)
A referral for cardiac rehab for your stable angina and diastolic CHF has been placed. Someone should call you with a consult in a week.   Research: [name] [name] for heart  Medication Instructions:  No changes  Lab work: No new labs needed  Testing/Procedures: No new testing needed  Follow-Up:  . You will need a follow up appointment in 6 months  . Providers on your designated Care Team:   . [name], NP . Christell Faith, PA-C . Marrianne Mood, PA-C  Any Other Special Instructions Will Be Listed Below (If Applicable).  COVID-19 Vaccine Information can be found at: ShippingScam.co.uk For questions related to vaccine distribution or appointments, please email [email or web address] .[email or web address] or call [phone].

## 2020-04-28 ENCOUNTER — Telehealth: Payer: Self-pay

## 2020-04-28 DIAGNOSIS — Z794 Long term (current) use of insulin: Secondary | ICD-10-CM | POA: Diagnosis not present

## 2020-04-28 DIAGNOSIS — E1122 Type 2 diabetes mellitus with diabetic chronic kidney disease: Secondary | ICD-10-CM | POA: Diagnosis not present

## 2020-04-28 DIAGNOSIS — I2510 Atherosclerotic heart disease of native coronary artery without angina pectoris: Secondary | ICD-10-CM | POA: Diagnosis not present

## 2020-04-28 DIAGNOSIS — R809 Proteinuria, unspecified: Secondary | ICD-10-CM | POA: Diagnosis not present

## 2020-04-28 DIAGNOSIS — E1159 Type 2 diabetes mellitus with other circulatory complications: Secondary | ICD-10-CM | POA: Diagnosis not present

## 2020-04-28 DIAGNOSIS — N1832 Chronic kidney disease, stage 3b: Secondary | ICD-10-CM | POA: Diagnosis not present

## 2020-04-28 DIAGNOSIS — E1129 Type 2 diabetes mellitus with other diabetic kidney complication: Secondary | ICD-10-CM | POA: Diagnosis not present

## 2020-04-28 DIAGNOSIS — Z9289 Personal history of other medical treatment: Secondary | ICD-10-CM | POA: Diagnosis not present

## 2020-04-28 DIAGNOSIS — R748 Abnormal levels of other serum enzymes: Secondary | ICD-10-CM

## 2020-04-28 LAB — COMPREHENSIVE METABOLIC PANEL
ALT: 25 IU/L (ref 0–44)
AST: 23 IU/L (ref 0–40)
Albumin/Globulin Ratio: 1.3 (ref 1.2–2.2)
Albumin: 3.5 g/dL — ABNORMAL LOW (ref 3.6–4.6)
Alkaline Phosphatase: 285 IU/L — ABNORMAL HIGH (ref 44–121)
BUN/Creatinine Ratio: 11 (ref 10–24)
BUN: 16 mg/dL (ref 8–27)
Bilirubin Total: 1.3 mg/dL — ABNORMAL HIGH (ref 0.0–1.2)
CO2: 19 mmol/L — ABNORMAL LOW (ref 20–29)
Calcium: 8.8 mg/dL (ref 8.6–10.2)
Chloride: 103 mmol/L (ref 96–106)
Creatinine, Ser: 1.41 mg/dL — ABNORMAL HIGH (ref 0.76–1.27)
GFR calc Af Amer: 53 mL/min/{1.73_m2} — ABNORMAL LOW (ref >59)
GFR calc non Af Amer: 45 mL/min/{1.73_m2} — ABNORMAL LOW (ref >59)
Globulin, Total: 2.6 g/dL (ref 1.5–4.5)
Glucose: 267 mg/dL — ABNORMAL HIGH (ref 65–99)
Potassium: 4.3 mmol/L (ref 3.5–5.2)
Sodium: 140 mmol/L (ref 134–144)
Total Protein: 6.1 g/dL (ref 6.0–8.5)

## 2020-04-28 NOTE — H&P (View-Only) (Signed)
[name], MD [address]  [place]  [place], [place] [address]  Main: [phone]  Fax: [phone]   Primary Care Physician: [name], MD   Chief Complaint  Patient presents with  . Hospitalization Follow-up    HPI: [name] [name] is a 87 y.o. male recently admitted and discharged on April 10, 2020 with elevated liver enzymes.  Patient presents with his family member today.  He states he was told by his PCP to go to the hospital due to elevated liver enzymes.  He denied having any abdominal pain.  However, did have fever chills prior to the hospital stay.  Patient was noted to have elevated liver enzymes and was evaluated by gastroenterology, Dr. Alice Reichert and his team.  Imaging including right upper quadrant ultrasound and CT did not show any CBD obstruction.  MRCP was initially planned but was then canceled for some reason prior to discharge.  Patient denies abdominal pain, nausea or vomiting, altered bowel habits since discharge.  Current Outpatient Medications  Medication Sig Dispense Refill  . amLODipine (NORVASC) 5 MG tablet Take 1 tablet (5 mg total) by mouth daily. 90 tablet 3  . aspirin EC 81 MG tablet Take 81 mg by mouth every other day.    . carvedilol (COREG) 12.5 MG tablet TAKE ONE (1) TABLET BY MOUTH TWO TIMES PER DAY 180 tablet 0  . ezetimibe (ZETIA) 10 MG tablet Take 1 tablet (10 mg total) by mouth daily. 90 tablet 3  . finasteride (PROSCAR) 5 MG tablet Take 1 tablet (5 mg total) by mouth daily. 90 tablet 1  . omeprazole (PRILOSEC) 20 MG capsule TAKE 1 CAPSULE BY MOUTH TWICE DAILY 60 capsule 1  . ondansetron (ZOFRAN ODT) 4 MG disintegrating tablet Take 1 tablet (4 mg total) by mouth every 8 (eight) hours as needed for nausea or vomiting. 20 tablet 0  . TOUJEO SOLOSTAR 300 UNIT/ML Solostar Pen INJECT 50 UNITS DAILY MAY INCREASE 2U EVERY 3 DAYS UNTIL FBG IS AT GOAL UP TO 80 UNITS DAILY 22.5 mL 6  . Vitamin D, Cholecalciferol, 1000 units TABS  Take 1,000 Units by mouth daily.     Marland Kitchen zolpidem (AMBIEN) 5 MG tablet Take 1 tablet (5 mg total) by mouth at bedtime as needed for sleep. Stop trazadone not helpful 30 tablet 0  . gabapentin (NEURONTIN) 100 MG capsule Take 100 mg by mouth 2 (two) times daily.     No current facility-administered medications for this visit.    Allergies as of 04/27/2020 - Review Complete 04/27/2020  Allergen Reaction Noted  . Cortisone Other (See Comments) 06/24/2016  . Metformin Diarrhea 09/05/2016  . Metformin and related Diarrhea 09/05/2016    ROS:  General: Negative for anorexia, weight loss, fever, chills, fatigue, weakness. ENT: Negative for hoarseness, difficulty swallowing , nasal congestion. CV: Negative for chest pain, angina, palpitations, dyspnea on exertion, peripheral edema.  Respiratory: Negative for dyspnea at rest, dyspnea on exertion, cough, sputum, wheezing.  GI: See history of present illness. GU:  Negative for dysuria, hematuria, urinary incontinence, urinary frequency, nocturnal urination.  Endo: Negative for unusual weight change.    Physical Examination:   BP (!) 158/74   Pulse 76   Temp (!) 97.4 F (36.3 C) (Oral)   Wt 212 lb (96.2 kg)   BMI 27.97 kg/m   General: Well-nourished, well-developed in no acute distress.  Eyes: No icterus. Conjunctivae pink. Mouth: Oropharyngeal mucosa moist and pink , no lesions erythema or exudate. Neck:  Supple, Trachea midline Abdomen: Bowel sounds are normal, nontender, nondistended, no hepatosplenomegaly or masses, no abdominal bruits or hernia , no rebound or guarding.   Extremities: No lower extremity edema. No clubbing or deformities. Neuro: Alert and oriented x 3.  Grossly intact. Skin: Warm and dry, no jaundice.   Psych: Alert and cooperative, normal mood and affect.   Labs: CMP     Component Value Date/Time   NA 140 04/27/2020 0930   NA 139 02/22/2013 1654   K 4.3 04/27/2020 0930   K 4.5 02/22/2013 1654   CL 103  04/27/2020 0930   CL 109 (H) 02/22/2013 1654   CO2 19 (L) 04/27/2020 0930   CO2 24 02/22/2013 1654   GLUCOSE 267 (H) 04/27/2020 0930   GLUCOSE 278 (H) 04/15/2020 1603   GLUCOSE 197 (H) 02/22/2013 1654   BUN 16 04/27/2020 0930   BUN 20 (H) 02/22/2013 1654   CREATININE 1.41 (H) 04/27/2020 0930   CREATININE 1.18 02/22/2013 1654   CREATININE 1.13 05/25/2012 1630   CALCIUM 8.8 04/27/2020 0930   CALCIUM 9.1 02/22/2013 1654   PROT 6.1 04/27/2020 0930   PROT 6.2 (L) 02/22/2013 1654   ALBUMIN 3.5 (L) 04/27/2020 0930   ALBUMIN 3.5 02/22/2013 1654   AST 23 04/27/2020 0930   AST 20 02/22/2013 1654   ALT 25 04/27/2020 0930   ALT 42 02/22/2013 1654   ALKPHOS 285 (H) 04/27/2020 0930   ALKPHOS 113 02/22/2013 1654   BILITOT 1.3 (H) 04/27/2020 0930   BILITOT 0.6 02/22/2013 1654   GFRNONAA 45 (L) 04/27/2020 0930   GFRNONAA 55 (L) 04/15/2020 1603   GFRNONAA 59 (L) 02/22/2013 1654   GFRAA 53 (L) 04/27/2020 0930   GFRAA >60 02/22/2013 1654   Lab Results  Component Value Date   WBC 6.5 04/15/2020   HGB 11.7 (L) 04/15/2020   HCT 34.1 (L) 04/15/2020   MCV 93.4 04/15/2020   PLT 163 04/15/2020    Imaging Studies: CT ABDOMEN PELVIS WO CONTRAST  Result Date: 04/08/2020 CLINICAL DATA:  Abdominal pain and abnormal liver function studies. EXAM: CT ABDOMEN AND PELVIS WITHOUT CONTRAST TECHNIQUE: Multidetector CT imaging of the abdomen and pelvis was performed following the standard protocol without IV contrast. COMPARISON:  09/07/2018 FINDINGS: Lower chest: The lung bases are clear of acute process. No pleural effusion or pulmonary lesions. The heart is normal in size. No pericardial effusion. There are stable coronary artery and aortic calcifications. The distal esophagus and aorta are unremarkable. Hepatobiliary: No hepatic lesions are identified without contrast. No intrahepatic biliary dilatation. The gallbladder is surgically absent. No common bile duct dilatation. Pancreas: No mass, inflammation or  ductal dilatation. Spleen: Mild stable splenomegaly.  No splenic lesions. Adrenals/Urinary Tract: The adrenal glands are normal. Mild age related renal cortical thinning and small stable renal cysts. No renal calculi, obstructing ureteral calculi or bladder mass or calculi. Stomach/Bowel: The stomach, duodenum, small bowel and colon are grossly normal. No acute inflammatory changes, mass lesions or obstructive findings. The terminal ileum is normal. The appendix is normal. Vascular/Lymphatic: Advanced vascular calcifications but no aneurysm. No mesenteric or retroperitoneal mass or adenopathy. Reproductive: Enlarged prostate gland. The seminal vesicles are unremarkable. Other: No pelvic mass or adenopathy. No free pelvic fluid collections. No inguinal mass or adenopathy. No abdominal wall hernia or subcutaneous lesions. Musculoskeletal: No significant bony findings. Stable advanced degenerative changes involving the spine. A right hip prosthesis is again noted. IMPRESSION: 1. No acute abdominal/pelvic findings, mass lesions or adenopathy. 2. Stable mild splenomegaly.  3. Status post cholecystectomy. No biliary dilatation. 4. Advanced vascular calcifications. 5. Enlarged prostate gland. Aortic Atherosclerosis (ICD10-I70.0). Electronically Signed   By: Marijo Sanes M.D.   On: 04/08/2020 14:40   [place] MESENTERIC ARTERIES  Result Date: 04/09/2020 CLINICAL DATA:  Poor appetite, early satiety, nausea and vomiting, abdominal pain EXAM: [place] MESENTERIC ARTERIAL DOPPLER COMPARISON:  CT 09/07/2018 FINDINGS: Celiac axis: 123-126 cm/sec Celiac axis with inspiration: 124 cm/sec Celiac axis with expiration: 132 cm/sec Splenic artery: 184 cm/sec Hepatic artery: 289 cm/sec SMA: 178-289 cm/sec IMA: 131 cm/sec Aorta: 208 cm/sec Aortic size: 2.6 cm proximally, 2.1 cm in the mid segment and 1.9 cm distally Technologist describes technically difficult study secondary to bowel gas and body habitus. IMPRESSION: No convincing evidence of  hemodynamically significant proximal visceral artery stenosis. Electronically Signed   By: Lucrezia Europe M.D.   On: 04/09/2020 11:32   US Abdomen Limited RUQ (LIVER/GB)  Result Date: 04/08/2020 CLINICAL DATA:  Right upper quadrant abdominal pain x3 days. EXAM: ULTRASOUND ABDOMEN LIMITED RIGHT UPPER QUADRANT COMPARISON:  04/08/2020 FINDINGS: Gallbladder: The patient is status post cholecystectomy. Common bile duct: Diameter: 5 mm Liver: Diffuse increased echogenicity with slightly heterogeneous liver. Appearance typically secondary to fatty infiltration. Fibrosis secondary consideration. No secondary findings of cirrhosis noted. No focal hepatic lesion or intrahepatic biliary duct dilatation. Portal vein is patent on color Doppler imaging with normal direction of blood flow towards the liver. Other: None. IMPRESSION: Status post cholecystectomy.  No acute abnormality detected. Electronically Signed   By: Constance Holster M.D.   On: 04/08/2020 15:53    Assessment and Plan:   [name] [name] is a 87 y.o. y/o male here after hospital admission for elevated liver enzymes  Liver enzymes repeated in the office today and showed improvement, but alk phos still persistently elevated  I will obtain MRCP at this time to rule out any obstructive lesions.  Given primary elevation in direct bilirubin, elevated GGT, and no previous elevation in total bilirubin prior to that admission, obstructive pathology still high on the differential  Patient otherwise asymptomatic with no signs of cholangitis  Dr [name]

## 2020-04-28 NOTE — Telephone Encounter (Signed)
Got patient scheduled for MRCP on 04/29/2020 at [facility]. Nothing to eat drink or drink 4 hours before. Patient verbalized understanding

## 2020-04-28 NOTE — Telephone Encounter (Signed)
-----   Message from Virgel Manifold, MD sent at 04/28/2020 11:35 AM EST ----- Herb Grays please let the patient know, his liver enzymes have improved but are still elevated.  Please order MRCP for elevated liver enzymes.  We may need this scheduled ASAP.  Can you talk to radiology to see when the soonest we can get this done, preferably this week

## 2020-04-28 NOTE — Telephone Encounter (Signed)
Order the MRCP per Dr. Bonna Gains orders. Ginger is getting Prior approval on the MRCP and then we will get patient scheduled. Called patient and he verbalized understanding. Informed patient I would give him a call with the time

## 2020-04-28 NOTE — Progress Notes (Signed)
[name], MD [address]  [place]  [place], [place] [address]  Main: [phone]  Fax: [phone]   Primary Care Physician: [name], MD   Chief Complaint  Patient presents with  . Hospitalization Follow-up    HPI: [name] [name] is a 87 y.o. male recently admitted and discharged on April 10, 2020 with elevated liver enzymes.  Patient presents with his family member today.  He states he was told by his PCP to go to the hospital due to elevated liver enzymes.  He denied having any abdominal pain.  However, did have fever chills prior to the hospital stay.  Patient was noted to have elevated liver enzymes and was evaluated by gastroenterology, Dr. Alice Reichert and his team.  Imaging including right upper quadrant ultrasound and CT did not show any CBD obstruction.  MRCP was initially planned but was then canceled for some reason prior to discharge.  Patient denies abdominal pain, nausea or vomiting, altered bowel habits since discharge.  Current Outpatient Medications  Medication Sig Dispense Refill  . amLODipine (NORVASC) 5 MG tablet Take 1 tablet (5 mg total) by mouth daily. 90 tablet 3  . aspirin EC 81 MG tablet Take 81 mg by mouth every other day.    . carvedilol (COREG) 12.5 MG tablet TAKE ONE (1) TABLET BY MOUTH TWO TIMES PER DAY 180 tablet 0  . ezetimibe (ZETIA) 10 MG tablet Take 1 tablet (10 mg total) by mouth daily. 90 tablet 3  . finasteride (PROSCAR) 5 MG tablet Take 1 tablet (5 mg total) by mouth daily. 90 tablet 1  . omeprazole (PRILOSEC) 20 MG capsule TAKE 1 CAPSULE BY MOUTH TWICE DAILY 60 capsule 1  . ondansetron (ZOFRAN ODT) 4 MG disintegrating tablet Take 1 tablet (4 mg total) by mouth every 8 (eight) hours as needed for nausea or vomiting. 20 tablet 0  . TOUJEO SOLOSTAR 300 UNIT/ML Solostar Pen INJECT 50 UNITS DAILY MAY INCREASE 2U EVERY 3 DAYS UNTIL FBG IS AT GOAL UP TO 80 UNITS DAILY 22.5 mL 6  . Vitamin D, Cholecalciferol, 1000 units TABS  Take 1,000 Units by mouth daily.     Marland Kitchen zolpidem (AMBIEN) 5 MG tablet Take 1 tablet (5 mg total) by mouth at bedtime as needed for sleep. Stop trazadone not helpful 30 tablet 0  . gabapentin (NEURONTIN) 100 MG capsule Take 100 mg by mouth 2 (two) times daily.     No current facility-administered medications for this visit.    Allergies as of 04/27/2020 - Review Complete 04/27/2020  Allergen Reaction Noted  . Cortisone Other (See Comments) 06/24/2016  . Metformin Diarrhea 09/05/2016  . Metformin and related Diarrhea 09/05/2016    ROS:  General: Negative for anorexia, weight loss, fever, chills, fatigue, weakness. ENT: Negative for hoarseness, difficulty swallowing , nasal congestion. CV: Negative for chest pain, angina, palpitations, dyspnea on exertion, peripheral edema.  Respiratory: Negative for dyspnea at rest, dyspnea on exertion, cough, sputum, wheezing.  GI: See history of present illness. GU:  Negative for dysuria, hematuria, urinary incontinence, urinary frequency, nocturnal urination.  Endo: Negative for unusual weight change.    Physical Examination:   BP (!) 158/74   Pulse 76   Temp (!) 97.4 F (36.3 C) (Oral)   Wt 212 lb (96.2 kg)   BMI 27.97 kg/m   General: Well-nourished, well-developed in no acute distress.  Eyes: No icterus. Conjunctivae pink. Mouth: Oropharyngeal mucosa moist and pink , no lesions erythema or exudate. Neck:  Supple, Trachea midline Abdomen: Bowel sounds are normal, nontender, nondistended, no hepatosplenomegaly or masses, no abdominal bruits or hernia , no rebound or guarding.   Extremities: No lower extremity edema. No clubbing or deformities. Neuro: Alert and oriented x 3.  Grossly intact. Skin: Warm and dry, no jaundice.   Psych: Alert and cooperative, normal mood and affect.   Labs: CMP     Component Value Date/Time   NA 140 04/27/2020 0930   NA 139 02/22/2013 1654   K 4.3 04/27/2020 0930   K 4.5 02/22/2013 1654   CL 103  04/27/2020 0930   CL 109 (H) 02/22/2013 1654   CO2 19 (L) 04/27/2020 0930   CO2 24 02/22/2013 1654   GLUCOSE 267 (H) 04/27/2020 0930   GLUCOSE 278 (H) 04/15/2020 1603   GLUCOSE 197 (H) 02/22/2013 1654   BUN 16 04/27/2020 0930   BUN 20 (H) 02/22/2013 1654   CREATININE 1.41 (H) 04/27/2020 0930   CREATININE 1.18 02/22/2013 1654   CREATININE 1.13 05/25/2012 1630   CALCIUM 8.8 04/27/2020 0930   CALCIUM 9.1 02/22/2013 1654   PROT 6.1 04/27/2020 0930   PROT 6.2 (L) 02/22/2013 1654   ALBUMIN 3.5 (L) 04/27/2020 0930   ALBUMIN 3.5 02/22/2013 1654   AST 23 04/27/2020 0930   AST 20 02/22/2013 1654   ALT 25 04/27/2020 0930   ALT 42 02/22/2013 1654   ALKPHOS 285 (H) 04/27/2020 0930   ALKPHOS 113 02/22/2013 1654   BILITOT 1.3 (H) 04/27/2020 0930   BILITOT 0.6 02/22/2013 1654   GFRNONAA 45 (L) 04/27/2020 0930   GFRNONAA 55 (L) 04/15/2020 1603   GFRNONAA 59 (L) 02/22/2013 1654   GFRAA 53 (L) 04/27/2020 0930   GFRAA >60 02/22/2013 1654   Lab Results  Component Value Date   WBC 6.5 04/15/2020   HGB 11.7 (L) 04/15/2020   HCT 34.1 (L) 04/15/2020   MCV 93.4 04/15/2020   PLT 163 04/15/2020    Imaging Studies: CT ABDOMEN PELVIS WO CONTRAST  Result Date: 04/08/2020 CLINICAL DATA:  Abdominal pain and abnormal liver function studies. EXAM: CT ABDOMEN AND PELVIS WITHOUT CONTRAST TECHNIQUE: Multidetector CT imaging of the abdomen and pelvis was performed following the standard protocol without IV contrast. COMPARISON:  09/07/2018 FINDINGS: Lower chest: The lung bases are clear of acute process. No pleural effusion or pulmonary lesions. The heart is normal in size. No pericardial effusion. There are stable coronary artery and aortic calcifications. The distal esophagus and aorta are unremarkable. Hepatobiliary: No hepatic lesions are identified without contrast. No intrahepatic biliary dilatation. The gallbladder is surgically absent. No common bile duct dilatation. Pancreas: No mass, inflammation or  ductal dilatation. Spleen: Mild stable splenomegaly.  No splenic lesions. Adrenals/Urinary Tract: The adrenal glands are normal. Mild age related renal cortical thinning and small stable renal cysts. No renal calculi, obstructing ureteral calculi or bladder mass or calculi. Stomach/Bowel: The stomach, duodenum, small bowel and colon are grossly normal. No acute inflammatory changes, mass lesions or obstructive findings. The terminal ileum is normal. The appendix is normal. Vascular/Lymphatic: Advanced vascular calcifications but no aneurysm. No mesenteric or retroperitoneal mass or adenopathy. Reproductive: Enlarged prostate gland. The seminal vesicles are unremarkable. Other: No pelvic mass or adenopathy. No free pelvic fluid collections. No inguinal mass or adenopathy. No abdominal wall hernia or subcutaneous lesions. Musculoskeletal: No significant bony findings. Stable advanced degenerative changes involving the spine. A right hip prosthesis is again noted. IMPRESSION: 1. No acute abdominal/pelvic findings, mass lesions or adenopathy. 2. Stable mild splenomegaly.  3. Status post cholecystectomy. No biliary dilatation. 4. Advanced vascular calcifications. 5. Enlarged prostate gland. Aortic Atherosclerosis (ICD10-I70.0). Electronically Signed   By: Marijo Sanes M.D.   On: 04/08/2020 14:40   [place] MESENTERIC ARTERIES  Result Date: 04/09/2020 CLINICAL DATA:  Poor appetite, early satiety, nausea and vomiting, abdominal pain EXAM: [place] MESENTERIC ARTERIAL DOPPLER COMPARISON:  CT 09/07/2018 FINDINGS: Celiac axis: 123-126 cm/sec Celiac axis with inspiration: 124 cm/sec Celiac axis with expiration: 132 cm/sec Splenic artery: 184 cm/sec Hepatic artery: 289 cm/sec SMA: 178-289 cm/sec IMA: 131 cm/sec Aorta: 208 cm/sec Aortic size: 2.6 cm proximally, 2.1 cm in the mid segment and 1.9 cm distally Technologist describes technically difficult study secondary to bowel gas and body habitus. IMPRESSION: No convincing evidence of  hemodynamically significant proximal visceral artery stenosis. Electronically Signed   By: Lucrezia Europe M.D.   On: 04/09/2020 11:32   US Abdomen Limited RUQ (LIVER/GB)  Result Date: 04/08/2020 CLINICAL DATA:  Right upper quadrant abdominal pain x3 days. EXAM: ULTRASOUND ABDOMEN LIMITED RIGHT UPPER QUADRANT COMPARISON:  04/08/2020 FINDINGS: Gallbladder: The patient is status post cholecystectomy. Common bile duct: Diameter: 5 mm Liver: Diffuse increased echogenicity with slightly heterogeneous liver. Appearance typically secondary to fatty infiltration. Fibrosis secondary consideration. No secondary findings of cirrhosis noted. No focal hepatic lesion or intrahepatic biliary duct dilatation. Portal vein is patent on color Doppler imaging with normal direction of blood flow towards the liver. Other: None. IMPRESSION: Status post cholecystectomy.  No acute abnormality detected. Electronically Signed   By: Constance Holster M.D.   On: 04/08/2020 15:53    Assessment and Plan:   AMONTE BROOKOVER is a 87 y.o. y/o male here after hospital admission for elevated liver enzymes  Liver enzymes repeated in the office today and showed improvement, but alk phos still persistently elevated  I will obtain MRCP at this time to rule out any obstructive lesions.  Given primary elevation in direct bilirubin, elevated GGT, and no previous elevation in total bilirubin prior to that admission, obstructive pathology still high on the differential  Patient otherwise asymptomatic with no signs of cholangitis  Dr [name]

## 2020-04-29 ENCOUNTER — Other Ambulatory Visit: Payer: Self-pay

## 2020-04-29 ENCOUNTER — Ambulatory Visit
Admission: RE | Admit: 2020-04-29 | Discharge: 2020-04-29 | Disposition: A | Discharge status: Home / Self Care | Payer: Medicare Other | Source: Ambulatory Visit | Attending: Gastroenterology | Admitting: Gastroenterology

## 2020-04-29 ENCOUNTER — Other Ambulatory Visit: Payer: Self-pay | Admitting: Gastroenterology

## 2020-04-29 DIAGNOSIS — K8070 Calculus of gallbladder and bile duct without cholecystitis without obstruction: Secondary | ICD-10-CM | POA: Diagnosis not present

## 2020-04-29 DIAGNOSIS — R748 Abnormal levels of other serum enzymes: Secondary | ICD-10-CM

## 2020-04-29 DIAGNOSIS — N281 Cyst of kidney, acquired: Secondary | ICD-10-CM | POA: Diagnosis not present

## 2020-04-29 DIAGNOSIS — K8050 Calculus of bile duct without cholangitis or cholecystitis without obstruction: Secondary | ICD-10-CM | POA: Diagnosis not present

## 2020-04-29 MED ORDER — GADOBUTROL 1 MMOL/ML IV SOLN
10.0000 mL | Freq: Once | INTRAVENOUS | Status: AC | PRN
  Administered 2020-04-29: 10 mL via INTRAVENOUS

## 2020-04-30 ENCOUNTER — Other Ambulatory Visit: Payer: Self-pay

## 2020-04-30 DIAGNOSIS — K8050 Calculus of bile duct without cholangitis or cholecystitis without obstruction: Secondary | ICD-10-CM

## 2020-05-01 ENCOUNTER — Other Ambulatory Visit: Payer: Self-pay

## 2020-05-01 ENCOUNTER — Other Ambulatory Visit
Admission: RE | Admit: 2020-05-01 | Discharge: 2020-05-01 | Disposition: A | Discharge status: Home / Self Care | Payer: Medicare Other | Source: Ambulatory Visit | Attending: Gastroenterology | Admitting: Gastroenterology

## 2020-05-01 DIAGNOSIS — Z01812 Encounter for preprocedural laboratory examination: Secondary | ICD-10-CM | POA: Diagnosis not present

## 2020-05-01 DIAGNOSIS — Z20822 Contact with and (suspected) exposure to covid-19: Secondary | ICD-10-CM | POA: Diagnosis not present

## 2020-05-01 LAB — SARS CORONAVIRUS 2 (TAT 6-24 HRS): SARS Coronavirus 2: NEGATIVE

## 2020-05-04 DIAGNOSIS — N1831 Chronic kidney disease, stage 3a: Secondary | ICD-10-CM | POA: Diagnosis not present

## 2020-05-04 DIAGNOSIS — I10 Essential (primary) hypertension: Secondary | ICD-10-CM | POA: Diagnosis not present

## 2020-05-04 DIAGNOSIS — E1129 Type 2 diabetes mellitus with other diabetic kidney complication: Secondary | ICD-10-CM | POA: Diagnosis not present

## 2020-05-04 DIAGNOSIS — R809 Proteinuria, unspecified: Secondary | ICD-10-CM | POA: Diagnosis not present

## 2020-05-05 ENCOUNTER — Encounter
Admission: RE | Disposition: A | Discharge status: Home / Self Care | Payer: Self-pay | Source: Home / Self Care | Attending: Gastroenterology

## 2020-05-05 ENCOUNTER — Encounter: Payer: Self-pay | Admitting: Gastroenterology

## 2020-05-05 ENCOUNTER — Ambulatory Visit: Payer: Medicare Other

## 2020-05-05 ENCOUNTER — Ambulatory Visit
Admission: RE | Admit: 2020-05-05 | Discharge: 2020-05-05 | Disposition: A | Discharge status: Home / Self Care | Payer: Medicare Other | Attending: Gastroenterology | Admitting: Gastroenterology

## 2020-05-05 ENCOUNTER — Telehealth: Payer: Self-pay | Admitting: Cardiovascular Disease

## 2020-05-05 ENCOUNTER — Other Ambulatory Visit: Payer: Self-pay

## 2020-05-05 DIAGNOSIS — Z7982 Long term (current) use of aspirin: Secondary | ICD-10-CM | POA: Insufficient documentation

## 2020-05-05 DIAGNOSIS — Z9049 Acquired absence of other specified parts of digestive tract: Secondary | ICD-10-CM | POA: Insufficient documentation

## 2020-05-05 DIAGNOSIS — Z7951 Long term (current) use of inhaled steroids: Secondary | ICD-10-CM | POA: Insufficient documentation

## 2020-05-05 DIAGNOSIS — I5032 Chronic diastolic (congestive) heart failure: Secondary | ICD-10-CM | POA: Diagnosis not present

## 2020-05-05 DIAGNOSIS — K219 Gastro-esophageal reflux disease without esophagitis: Secondary | ICD-10-CM | POA: Diagnosis not present

## 2020-05-05 DIAGNOSIS — Z7984 Long term (current) use of oral hypoglycemic drugs: Secondary | ICD-10-CM | POA: Diagnosis not present

## 2020-05-05 DIAGNOSIS — E785 Hyperlipidemia, unspecified: Secondary | ICD-10-CM | POA: Diagnosis not present

## 2020-05-05 DIAGNOSIS — Z888 Allergy status to other drugs, medicaments and biological substances status: Secondary | ICD-10-CM | POA: Insufficient documentation

## 2020-05-05 DIAGNOSIS — Z9689 Presence of other specified functional implants: Secondary | ICD-10-CM | POA: Diagnosis not present

## 2020-05-05 DIAGNOSIS — Z794 Long term (current) use of insulin: Secondary | ICD-10-CM | POA: Insufficient documentation

## 2020-05-05 DIAGNOSIS — Z96641 Presence of right artificial hip joint: Secondary | ICD-10-CM | POA: Diagnosis not present

## 2020-05-05 DIAGNOSIS — N183 Chronic kidney disease, stage 3 unspecified: Secondary | ICD-10-CM | POA: Diagnosis not present

## 2020-05-05 DIAGNOSIS — Z955 Presence of coronary angioplasty implant and graft: Secondary | ICD-10-CM | POA: Diagnosis not present

## 2020-05-05 DIAGNOSIS — R1013 Epigastric pain: Secondary | ICD-10-CM | POA: Diagnosis not present

## 2020-05-05 DIAGNOSIS — K8050 Calculus of bile duct without cholangitis or cholecystitis without obstruction: Secondary | ICD-10-CM

## 2020-05-05 DIAGNOSIS — I130 Hypertensive heart and chronic kidney disease with heart failure and stage 1 through stage 4 chronic kidney disease, or unspecified chronic kidney disease: Secondary | ICD-10-CM | POA: Diagnosis not present

## 2020-05-05 HISTORY — PX: ERCP: SHX5425

## 2020-05-05 SURGERY — ERCP, WITH INTERVENTION IF INDICATED
Anesthesia: General

## 2020-05-05 MED ORDER — PROPOFOL 500 MG/50ML IV EMUL
INTRAVENOUS | Status: DC | PRN
  Administered 2020-05-05: 150 ug/kg/min via INTRAVENOUS

## 2020-05-05 MED ORDER — PROPOFOL 10 MG/ML IV BOLUS
INTRAVENOUS | Status: DC | PRN
  Administered 2020-05-05: 50 mg via INTRAVENOUS

## 2020-05-05 MED ORDER — INDOMETHACIN 50 MG RE SUPP
100.0000 mg | Freq: Once | RECTAL | Status: AC
  Administered 2020-05-05: 100 mg via RECTAL

## 2020-05-05 MED ORDER — SODIUM CHLORIDE 0.9 % IV SOLN: INTRAVENOUS | Status: DC

## 2020-05-05 MED ORDER — INDOMETHACIN 50 MG RE SUPP
RECTAL | Status: AC
  Filled 2020-05-05: qty 2

## 2020-05-05 MED ORDER — PROPOFOL 10 MG/ML IV BOLUS
INTRAVENOUS | Status: AC
  Filled 2020-05-05: qty 20

## 2020-05-05 MED ORDER — LIDOCAINE HCL (CARDIAC) PF 100 MG/5ML IV SOSY
PREFILLED_SYRINGE | INTRAVENOUS | Status: DC | PRN
  Administered 2020-05-05: 50 mg via INTRAVENOUS

## 2020-05-05 NOTE — Telephone Encounter (Signed)
Pt c/o BP issue: STAT if pt c/o blurred vision, one-sided weakness or slurred speech  1. What are your last 5 BP readings?179/?  2. Are you having any other symptoms (ex. Dizziness, headache, blurred vision, passed out)? No symptoms mentioned  3. What is your BP issue? High. Pt had endoscopy this morning and was advised to call about BP concern before patient beginning heart medication

## 2020-05-05 NOTE — Anesthesia Preprocedure Evaluation (Signed)
Anesthesia Evaluation  Patient identified by MRN, date of birth, ID band Patient awake    Reviewed: Allergy & Precautions, H&P , NPO status , Patient's Chart, lab work & pertinent test results  History of Anesthesia Complications Negative for: history of anesthetic complications  Airway Mallampati: III  TM Distance: <3 FB Neck ROM: full    Dental  (+) Chipped, Poor Dentition, Missing   Pulmonary neg shortness of breath, sleep apnea ,    Pulmonary exam normal        Cardiovascular Exercise Tolerance: Good hypertension, (-) angina+ CAD, + CABG and +CHF  Normal cardiovascular exam     Neuro/Psych  Neuromuscular disease negative psych ROS   GI/Hepatic GERD  Medicated and Controlled,  Endo/Other  diabetes, Type 2  Renal/GU Renal disease  negative genitourinary   Musculoskeletal  (+) Arthritis ,   Abdominal   Peds  Hematology negative hematology ROS (+)   Anesthesia Other Findings Patient is NPO appropriate and reports no nausea or vomiting today.  Past Medical History: No date: BPH (benign prostatic hyperplasia) No date: Cancer [facility])     Comment:  skin No date: Cataract No date: Coronary artery disease     Comment:  a. 4v CABG 01/2005; b. cath 06/05/13 showed patent grafts:              LIMA-LAD, VG-D, VG-OM3, VG-dRCA, nl filling pressures, nl              LV fxn No date: Diastolic dysfunction     Comment:  a. echo 10/1015: nl systolic fxn, mild LVH, diastolic               relaxation abnormality, mildly enlarged LA, mild Ao               insufficiency No date: Dyspnea     Comment:  with exertion No date: ED (erectile dysfunction) No date: GERD (gastroesophageal reflux disease) No date: Hyperlipidemia No date: Hypertension 2000: IDDM (insulin dependent diabetes mellitus) No date: Osteoarthritis No date: Perirectal fistula 12/2016: Pneumonia 09/04/2018: Shingles 07/2012: Tubular adenoma of colon No date:  Vertigo  Past Surgical History: No date: BACK SURGERY 06/05/2013: CARDIAC CATHETERIZATION     Comment:  [facility]  09/24/2015: CARDIAC CATHETERIZATION; N/A     Comment:  Procedure: Right Heart Cath and Coronary/Graft               Angiography;  Surgeon: Minna Merritts, MD;  Location:               [place] [facility];  Service: Cardiovascular;                Laterality: N/A; sept 2013: CATARACT EXTRACTION     Comment:  right 03/28/2017: CATARACT EXTRACTION W/PHACO; Left     Comment:  Procedure: CATARACT EXTRACTION PHACO AND INTRAOCULAR               LENS PLACEMENT (IOC);  Surgeon: Birder Robson, MD;                Location: [facility] ORS;  Service: Ophthalmology;  Laterality:              Left;  [place] 00:42.0 AP% 15.0 CDE 6.31 Fluid Pack lot #               [id number] 05/15/2011: CHOLECYSTECTOMY     Comment:  Procedure: LAPAROSCOPIC CHOLECYSTECTOMY;  Surgeon:  Rolm Bookbinder, MD;  Location: [facility];  Service:               General;  Laterality: N/A; No date: COLONOSCOPY W/ POLYPECTOMY 2007: CORONARY ARTERY BYPASS GRAFT     Comment:  x 4 No date: EYE SURGERY 2012: FOOT SURGERY     Comment:  right foot 8/10: JOINT REPLACEMENT     Comment:  Right THR--[name] 06/05/2013: LEFT AND RIGHT HEART CATHETERIZATION WITH CORONARY/GRAFT  ANGIOGRAM; N/A     Comment:  Procedure: LEFT AND RIGHT HEART CATHETERIZATION WITH               Beatrix Fetters;  Surgeon: Peter M Martinique, MD;                Location: [facility] CATH LAB;  Service: Cardiovascular;                Laterality: N/A; 1989: LUMBAR LAMINECTOMY 5/16: Prostate photovaporization     Comment:  Dr Budd Palmer 01/05/2018: RIGHT/LEFT HEART CATH AND CORONARY/GRAFT ANGIOGRAPHY; N/A     Comment:  Procedure: RIGHT/LEFT HEART CATH AND CORONARY/GRAFT               ANGIOGRAPHY;  Surgeon: Minna Merritts, MD;  Location:               [place];  Service: Cardiovascular;                Laterality: N/A;  BMI     Body Mass Index: 27.57 kg/m      Reproductive/Obstetrics negative OB ROS                             Anesthesia Physical Anesthesia Plan  ASA: III  Anesthesia Plan: General   Post-op Pain Management:    Induction: Intravenous  PONV Risk Score and Plan: Propofol infusion and TIVA  Airway Management Planned: Natural Airway and Nasal Cannula  Additional Equipment:   Intra-op Plan:   Post-operative Plan:   Informed Consent: I have reviewed the patients History and Physical, chart, labs and discussed the procedure including the risks, benefits and alternatives for the proposed anesthesia with the patient or authorized representative who has indicated his/her understanding and acceptance.     Dental Advisory Given  Plan Discussed with: Anesthesiologist, CRNA and Surgeon  Anesthesia Plan Comments: (Patient consented for risks of anesthesia including but not limited to:  - adverse reactions to medications - risk of airway placement if required - damage to eyes, teeth, lips or other oral mucosa - nerve damage due to positioning  - sore throat or hoarseness - Damage to heart, brain, nerves, lungs, other parts of body or loss of life  Patient voiced understanding.)        Anesthesia Quick Evaluation

## 2020-05-05 NOTE — Progress Notes (Signed)
No risk at this time. 

## 2020-05-05 NOTE — Telephone Encounter (Signed)
Was able to return call to pt's friend Thayer Headings (DPR approved) pt had a colonoscopy today in hospital, they are currently on their way home now. Providers were concern for reported HTN. Pt is currently not taking any BP medications at this time, Thayer Headings reports last ED visit 2/2 they took him off his BP meds when having kidney issues. Dr. Rockey Situ seen pt at f/u visit on 2/21, he is aware of pt off BP meds, may half to restart ramipril if Bp trends up. Shirline Frees to let pt rest the rest of the day d/t sedation meds, in the morning after his first urination and before food and caffeine take, take Mr. Staples BP and HR. If elevated to please call the clinic and may need to restart the ramipril as stated in Dr. Donivan Scull notes. Thayer Headings verbalized understanding and will call for further plan of care if HTN persists.

## 2020-05-05 NOTE — Anesthesia Postprocedure Evaluation (Signed)
Anesthesia Post Note  Patient: [name] [name]  Procedure(s) Performed: ENDOSCOPIC RETROGRADE CHOLANGIOPANCREATOGRAPHY (ERCP) (N/A )  Patient location during evaluation: Endoscopy Anesthesia Type: General Level of consciousness: awake and alert Pain management: pain level controlled Vital Signs Assessment: post-procedure vital signs reviewed and stable Respiratory status: spontaneous breathing, nonlabored ventilation, respiratory function stable and patient connected to nasal cannula oxygen Cardiovascular status: blood pressure returned to baseline and stable Postop Assessment: no apparent nausea or vomiting Anesthetic complications: no   No complications documented.   Last Vitals:  Vitals:   05/05/20 1220 05/05/20 1232  BP: (!) 172/71 (!) 161/58  Pulse: (!) 50 (!) 43  Resp: 19 16  Temp:    SpO2: 98% 99%    Last Pain:  Vitals:   05/05/20 1232  TempSrc:   PainSc: 9                  Praneeth Bussey K Rashaunda Rahl

## 2020-05-05 NOTE — Interval H&P Note (Signed)
Lucilla Lame, MD [facility] [address], [place], [place] [address] [phone] Fax : [phone]  Primary Care Physician:  [name], MD Primary Gastroenterologist:  Dr. Allen Norris  Pre-Procedure History & Physical: HPI:  [name] [name] is a 87 y.o. male is here for an ERCP.   Past Medical History:  Diagnosis Date   BPH (benign prostatic hyperplasia)    Cancer ([place])    skin   Cataract    Coronary artery disease    a. 4v CABG 01/2005; b. cath 06/05/13 showed patent grafts: LIMA-LAD, VG-D, VG-OM3, VG-dRCA, nl filling pressures, nl LV fxn   Diastolic dysfunction    a. echo 09/3218: nl systolic fxn, mild LVH, diastolic relaxation abnormality, mildly enlarged LA, mild Ao insufficiency   Dyspnea    with exertion   ED (erectile dysfunction)    GERD (gastroesophageal reflux disease)    Hyperlipidemia    Hypertension    IDDM (insulin dependent diabetes mellitus) 2000   Osteoarthritis    Perirectal fistula    Pneumonia 12/2016   Shingles 09/04/2018   Tubular adenoma of colon 07/2012   Vertigo     Past Surgical History:  Procedure Laterality Date   BACK SURGERY     CARDIAC CATHETERIZATION  06/05/2013   [facility]    CARDIAC CATHETERIZATION N/A 09/24/2015   Procedure: Right Heart Cath and Coronary/Graft Angiography;  Surgeon: Minna Merritts, MD;  Location: [place] [facility];  Service: Cardiovascular;  Laterality: N/A;   CATARACT EXTRACTION  sept 2013   right   CATARACT EXTRACTION W/PHACO Left 03/28/2017   Procedure: CATARACT EXTRACTION PHACO AND INTRAOCULAR LENS PLACEMENT (IOC);  Surgeon: Birder Robson, MD;  Location: [facility] ORS;  Service: Ophthalmology;  Laterality: Left;  [place] 00:42.0 AP% 15.0 CDE 6.31 Fluid Pack lot # [id number]   CHOLECYSTECTOMY  05/15/2011   Procedure: LAPAROSCOPIC CHOLECYSTECTOMY;  Surgeon: Rolm Bookbinder, MD;  Location: [facility];  Service: General;  Laterality: N/A;   COLONOSCOPY W/ POLYPECTOMY     CORONARY ARTERY BYPASS GRAFT  2007   x  4   EYE SURGERY     FOOT SURGERY  2012   right foot   JOINT REPLACEMENT  8/10   Right THR--[name]   LEFT AND RIGHT HEART CATHETERIZATION WITH CORONARY/GRAFT ANGIOGRAM N/A 06/05/2013   Procedure: LEFT AND RIGHT HEART CATHETERIZATION WITH Beatrix Fetters;  Surgeon: Peter M Martinique, MD;  Location: [facility] CATH LAB;  Service: Cardiovascular;  Laterality: N/A;   LUMBAR LAMINECTOMY  1989   Prostate photovaporization  5/16   Dr Budd Palmer   RIGHT/LEFT HEART CATH AND CORONARY/GRAFT ANGIOGRAPHY N/A 01/05/2018   Procedure: RIGHT/LEFT HEART CATH AND CORONARY/GRAFT ANGIOGRAPHY;  Surgeon: Minna Merritts, MD;  Location: [place] [facility];  Service: Cardiovascular;  Laterality: N/A;    Prior to Admission medications   Medication Sig Start Date End Date Taking? Authorizing Provider  carvedilol (COREG) 12.5 MG tablet TAKE ONE (1) TABLET BY MOUTH TWO TIMES PER DAY 04/01/20  Yes Gollan, Kathlene November, MD  ezetimibe (ZETIA) 10 MG tablet Take 1 tablet (10 mg total) by mouth daily. 04/10/20 07/09/20 Yes Nolberto Hanlon, MD  finasteride (PROSCAR) 5 MG tablet Take 1 tablet (5 mg total) by mouth daily. 03/11/20  Yes [name], MD  gabapentin (NEURONTIN) 100 MG capsule Take 100 mg by mouth 2 (two) times daily.   Yes [provider]  omeprazole (PRILOSEC) 20 MG capsule TAKE 1 CAPSULE BY MOUTH TWICE DAILY 04/24/20  Yes [name], MD  TOUJEO SOLOSTAR 300 UNIT/ML Solostar Pen INJECT 50 UNITS DAILY MAY INCREASE 2U EVERY 3 DAYS UNTIL FBG IS AT GOAL UP TO 80 UNITS DAILY 05/22/19  Yes [name], MD  Vitamin D, Cholecalciferol, 1000 units TABS Take 1,000 Units by mouth daily.    Yes [provider]  amLODipine (NORVASC) 5 MG tablet Take 1 tablet (5 mg total) by mouth daily. Patient not taking: Reported on 05/05/2020 10/21/19   Minna Merritts, MD  aspirin EC 81 MG tablet Take 81 mg by mouth every other day.    [provider]  FARXIGA 10 MG TABS tablet Take 10 mg by mouth  every morning. 04/28/20   [provider]  ondansetron (ZOFRAN ODT) 4 MG disintegrating tablet Take 1 tablet (4 mg total) by mouth every 8 (eight) hours as needed for nausea or vomiting. 04/06/20   Crecencio Mc, MD  zolpidem (AMBIEN) 5 MG tablet Take 1 tablet (5 mg total) by mouth at bedtime as needed for sleep. Stop trazadone not helpful 02/26/19   [name], MD    Allergies as of 05/01/2020 - Review Complete 04/27/2020  Allergen Reaction Noted   Cortisone Other (See Comments) 06/24/2016   Metformin Diarrhea 09/05/2016   Metformin and related Diarrhea 09/05/2016    Family History  Problem Relation Age of Onset   Lung cancer Mother    Heart disease Father    Colon cancer Neg Hx    Breast cancer Neg Hx     Social History   Socioeconomic History   Marital status: Married    Spouse name: Not on file   Number of children: 4   Years of education: Not on file   Highest education level: Not on file  Occupational History   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  Tobacco Use   Smoking status: Never Smoker   Smokeless tobacco: Never Used  Scientific laboratory technician Use: Never used  Substance and Sexual Activity   Alcohol use: Yes    Comment: occ cocktail   Drug use: No   Sexual activity: Not on file  Other Topics Concern   Not on file  Social History Narrative   Goes by [employer]   Has living will    Has designated [employer] as health care POA   Would accept resuscitation attempts   Would not want tube feeds if cognitively unaware   Social Determinants of Health   Financial Resource Strain: Not on file  Food Insecurity: Not on file  Transportation Needs: Not on file  Physical Activity: Not on file  Stress: Not on file  Social Connections: Not on file  Intimate Partner Violence: Not on file    Review of Systems: See HPI, otherwise negative ROS  Physical Exam: BP (!) 196/78   Pulse 70   Temp (!) 97.2 F (36.2  C) (Temporal)   Resp 18   Ht 6\' 1"  (1.854 m)   Wt 94.8 kg   SpO2 99%   BMI 27.57 kg/m  General:   Alert,  pleasant and cooperative in NAD Head:  Normocephalic and atraumatic. Neck:  Supple; no masses or thyromegaly. Lungs:  Clear throughout to auscultation.    Heart:  Regular rate and rhythm. Abdomen:  Soft, nontender and nondistended. Normal bowel sounds, without guarding, and without rebound.   Neurologic:  Alert and  oriented x4;  grossly normal neurologically.  Impression/Plan: [name] [name] is here for an ERCP to be performed  for CBD stone  Risks, benefits, limitations, and alternatives regarding  ERCP have been reviewed with the patient.  Questions have been answered.  All parties agreeable.   Lucilla Lame, MD  05/05/2020, 11:10 AM

## 2020-05-05 NOTE — Op Note (Signed)
[facility] Patient Name: [name] [name] Procedure Date: 05/05/2020 10:59 AM MRN: [id number] Account #: [id number] Date of Birth: December 18, 1933 Admit Type: Outpatient Age: 87 Room: [facility] ENDO ROOM 4 Gender: Male Note Status: Finalized Procedure:             ERCP Indications:           Common bile duct stone(s) Providers:             Lucilla Lame MD, MD Medicines:             Propofol per Anesthesia Complications:         No immediate complications. Procedure:             Pre-Anesthesia Assessment:                        - Prior to the procedure, a History and Physical was                         performed, and patient medications and allergies were                         reviewed. The patient's tolerance of previous                         anesthesia was also reviewed. The risks and benefits                         of the procedure and the sedation options and risks                         were discussed with the patient. All questions were                         answered, and informed consent was obtained. Prior                         Anticoagulants: The patient has taken no previous                         anticoagulant or antiplatelet agents. ASA Grade                         Assessment: II - A patient with mild systemic disease.                         After reviewing the risks and benefits, the patient                         was deemed in satisfactory condition to undergo the                         procedure.                        After obtaining informed consent, the scope was passed                         under direct vision. Throughout the procedure, the  patient's blood pressure, pulse, and oxygen                         saturations were monitored continuously. The [employer] D single use duodenoscope was                         introduced through the mouth, and used to  inject                         contrast into and used to inject contrast into the                         bile duct. The ERCP was accomplished without                         difficulty. The patient tolerated the procedure well. Findings:      A scout film of the abdomen was obtained. Surgical clips, consistent       with a previous cholecystectomy, were seen in the area of the right       upper quadrant of the abdomen. The esophagus was successfully intubated       under direct vision. The scope was advanced to a normal major papilla in       the descending duodenum without detailed examination of the pharynx,       larynx and associated structures, and upper GI tract. The upper GI tract       was grossly normal. The bile duct was deeply cannulated with the       short-nosed traction sphincterotome. Contrast was injected. I personally       interpreted the bile duct images. There was brisk flow of contrast       through the ducts. Image quality was excellent. Contrast extended to the       entire biliary tree. The lower third of the main bile duct contained one       stone. A wire was passed into the biliary tree. A 6 mm biliary       sphincterotomy was made with a traction (standard) sphincterotome using       ERBE electrocautery. There was no post-sphincterotomy bleeding. The       biliary tree was swept with a 15 mm balloon starting at the bifurcation.       One stone was removed. No stones remained. Impression:            - Choledocholithiasis was found. Complete removal was                         accomplished by biliary sphincterotomy and balloon                         extraction.                        - A biliary sphincterotomy was performed.                        - The biliary tree was swept. Recommendation:        -  Discharge patient to home.                        - Clear liquid diet today.                        - Continue present medications.                        -  Watch for pancreatitis, bleeding, perforation, and                         cholangitis. Procedure Code(s):     --- Professional ---                        [phone], Endoscopic retrograde cholangiopancreatography                         (ERCP); with removal of calculi/debris from                         biliary/pancreatic duct(s)                        43262, Endoscopic retrograde cholangiopancreatography                         (ERCP); with sphincterotomy/papillotomy                        [id number], Endoscopic catheterization of the biliary                         ductal system, radiological supervision and                         interpretation Diagnosis Code(s):     --- Professional ---                        K80.50, Calculus of bile duct without cholangitis or                         cholecystitis without obstruction CPT copyright 2019 American Medical Association. All rights reserved. The codes documented in this report are preliminary and upon coder review may  be revised to meet current compliance requirements. Lucilla Lame MD, MD 05/05/2020 11:39:38 AM This report has been signed electronically. Number of Addenda: 0 Note Initiated On: 05/05/2020 10:59 AM Estimated Blood Loss:  Estimated blood loss: none.      [facility]

## 2020-05-05 NOTE — Transfer of Care (Signed)
Immediate Anesthesia Transfer of Care Note  Patient: [name] [name]  Procedure(s) Performed: ENDOSCOPIC RETROGRADE CHOLANGIOPANCREATOGRAPHY (ERCP) (N/A )  Patient Location: Endoscopy Unit  Anesthesia Type:General  Level of Consciousness: drowsy  Airway & Oxygen Therapy: Patient Spontanous Breathing  Post-op Assessment: Report given to RN and Post -op Vital signs reviewed and stable  Post vital signs: Reviewed and stable  Last Vitals:  Vitals Value Taken Time  BP 112/62 05/05/20 1141  Temp 36.4 C 05/05/20 1141  Pulse 56 05/05/20 1145  Resp 24 05/05/20 1145  SpO2 98 % 05/05/20 1145  Vitals shown include unvalidated device data.  Last Pain:  Vitals:   05/05/20 1141  TempSrc:   PainSc: 0-No pain         Complications: No complications documented.

## 2020-05-07 ENCOUNTER — Telehealth: Payer: Self-pay | Admitting: Gastroenterology

## 2020-05-07 ENCOUNTER — Encounter: Payer: Self-pay | Admitting: Gastroenterology

## 2020-05-07 NOTE — Telephone Encounter (Signed)
Taking you find that if the patient is having any abdominal pain or signs of pancreatitis.  If he is running low-grade temperature and is nauseated can be related to possible aspiration versus another source of infection.  please ask the patient if there are any other symptoms such as coughing, black stools, bloody stools diarrhea or problems with urination.  If the symptoms continue then he should go to the emergency room.

## 2020-05-07 NOTE — Telephone Encounter (Signed)
Patients wife called and [name] [name] had a procedure w/ Dr. Allen Norris on 05/05/20 and is running a low grade temperature and nauseated. Please call patient.

## 2020-05-07 NOTE — Telephone Encounter (Signed)
Contacted pt regarding symptoms he is currently experiencing post ERCP. Pt stated he has had a low grade fever and nausea ever since the procedure. He has taken tylenol and the fever goes away. No abdominal pain, coughing black stools or trouble urinating. He is having trouble sleeping. Wife thinks he may have some yellowish color in his eyes but wasn't quite sure. Pt will continue to monitor these symptoms. Advised him if he experiences extreme abdominal pain and fever, he is to go directly to the ER. Pt verbalized understanding.

## 2020-05-11 ENCOUNTER — Encounter: Payer: Self-pay | Admitting: Emergency Medicine

## 2020-05-11 ENCOUNTER — Telehealth: Payer: Self-pay | Admitting: Family Medicine

## 2020-05-11 ENCOUNTER — Telehealth: Payer: Self-pay | Admitting: Gastroenterology

## 2020-05-11 ENCOUNTER — Inpatient Hospital Stay
Admission: EM | Admit: 2020-05-11 | Discharge: 2020-05-15 | DRG: 378 | Disposition: A | Discharge status: Home / Self Care | Payer: Medicare Other | Attending: Student | Admitting: Student

## 2020-05-11 ENCOUNTER — Emergency Department: Payer: Medicare Other

## 2020-05-11 ENCOUNTER — Other Ambulatory Visit: Payer: Self-pay

## 2020-05-11 DIAGNOSIS — K5900 Constipation, unspecified: Secondary | ICD-10-CM | POA: Diagnosis not present

## 2020-05-11 DIAGNOSIS — I25118 Atherosclerotic heart disease of native coronary artery with other forms of angina pectoris: Secondary | ICD-10-CM | POA: Diagnosis not present

## 2020-05-11 DIAGNOSIS — R1013 Epigastric pain: Secondary | ICD-10-CM | POA: Diagnosis not present

## 2020-05-11 DIAGNOSIS — Z7984 Long term (current) use of oral hypoglycemic drugs: Secondary | ICD-10-CM

## 2020-05-11 DIAGNOSIS — Z79899 Other long term (current) drug therapy: Secondary | ICD-10-CM | POA: Diagnosis not present

## 2020-05-11 DIAGNOSIS — K7589 Other specified inflammatory liver diseases: Secondary | ICD-10-CM | POA: Diagnosis not present

## 2020-05-11 DIAGNOSIS — I5032 Chronic diastolic (congestive) heart failure: Secondary | ICD-10-CM | POA: Diagnosis present

## 2020-05-11 DIAGNOSIS — E1122 Type 2 diabetes mellitus with diabetic chronic kidney disease: Secondary | ICD-10-CM | POA: Diagnosis present

## 2020-05-11 DIAGNOSIS — N400 Enlarged prostate without lower urinary tract symptoms: Secondary | ICD-10-CM | POA: Diagnosis present

## 2020-05-11 DIAGNOSIS — K760 Fatty (change of) liver, not elsewhere classified: Secondary | ICD-10-CM | POA: Diagnosis present

## 2020-05-11 DIAGNOSIS — Z794 Long term (current) use of insulin: Secondary | ICD-10-CM | POA: Diagnosis not present

## 2020-05-11 DIAGNOSIS — E119 Type 2 diabetes mellitus without complications: Secondary | ICD-10-CM | POA: Diagnosis present

## 2020-05-11 DIAGNOSIS — R17 Unspecified jaundice: Secondary | ICD-10-CM | POA: Diagnosis not present

## 2020-05-11 DIAGNOSIS — R7989 Other specified abnormal findings of blood chemistry: Secondary | ICD-10-CM

## 2020-05-11 DIAGNOSIS — K219 Gastro-esophageal reflux disease without esophagitis: Secondary | ICD-10-CM | POA: Diagnosis present

## 2020-05-11 DIAGNOSIS — Z9049 Acquired absence of other specified parts of digestive tract: Secondary | ICD-10-CM

## 2020-05-11 DIAGNOSIS — Z8249 Family history of ischemic heart disease and other diseases of the circulatory system: Secondary | ICD-10-CM | POA: Diagnosis not present

## 2020-05-11 DIAGNOSIS — E118 Type 2 diabetes mellitus with unspecified complications: Secondary | ICD-10-CM | POA: Diagnosis present

## 2020-05-11 DIAGNOSIS — Z20822 Contact with and (suspected) exposure to covid-19: Secondary | ICD-10-CM | POA: Diagnosis present

## 2020-05-11 DIAGNOSIS — Z888 Allergy status to other drugs, medicaments and biological substances status: Secondary | ICD-10-CM

## 2020-05-11 DIAGNOSIS — K8050 Calculus of bile duct without cholangitis or cholecystitis without obstruction: Secondary | ICD-10-CM | POA: Diagnosis present

## 2020-05-11 DIAGNOSIS — K449 Diaphragmatic hernia without obstruction or gangrene: Secondary | ICD-10-CM | POA: Diagnosis present

## 2020-05-11 DIAGNOSIS — K921 Melena: Secondary | ICD-10-CM | POA: Diagnosis not present

## 2020-05-11 DIAGNOSIS — Z85828 Personal history of other malignant neoplasm of skin: Secondary | ICD-10-CM

## 2020-05-11 DIAGNOSIS — E785 Hyperlipidemia, unspecified: Secondary | ICD-10-CM | POA: Diagnosis present

## 2020-05-11 DIAGNOSIS — R7401 Elevation of levels of liver transaminase levels: Secondary | ICD-10-CM | POA: Diagnosis not present

## 2020-05-11 DIAGNOSIS — I2510 Atherosclerotic heart disease of native coronary artery without angina pectoris: Secondary | ICD-10-CM | POA: Diagnosis not present

## 2020-05-11 DIAGNOSIS — I130 Hypertensive heart and chronic kidney disease with heart failure and stage 1 through stage 4 chronic kidney disease, or unspecified chronic kidney disease: Secondary | ICD-10-CM | POA: Diagnosis present

## 2020-05-11 DIAGNOSIS — N183 Chronic kidney disease, stage 3 unspecified: Secondary | ICD-10-CM | POA: Diagnosis present

## 2020-05-11 DIAGNOSIS — N1831 Chronic kidney disease, stage 3a: Secondary | ICD-10-CM | POA: Diagnosis not present

## 2020-05-11 DIAGNOSIS — L299 Pruritus, unspecified: Secondary | ICD-10-CM | POA: Diagnosis present

## 2020-05-11 DIAGNOSIS — I10 Essential (primary) hypertension: Secondary | ICD-10-CM | POA: Diagnosis present

## 2020-05-11 DIAGNOSIS — Z801 Family history of malignant neoplasm of trachea, bronchus and lung: Secondary | ICD-10-CM | POA: Diagnosis not present

## 2020-05-11 DIAGNOSIS — Z7982 Long term (current) use of aspirin: Secondary | ICD-10-CM

## 2020-05-11 DIAGNOSIS — B179 Acute viral hepatitis, unspecified: Secondary | ICD-10-CM | POA: Diagnosis present

## 2020-05-11 DIAGNOSIS — I493 Ventricular premature depolarization: Secondary | ICD-10-CM | POA: Diagnosis not present

## 2020-05-11 DIAGNOSIS — Z951 Presence of aortocoronary bypass graft: Secondary | ICD-10-CM | POA: Diagnosis not present

## 2020-05-11 DIAGNOSIS — Z7689 Persons encountering health services in other specified circumstances: Secondary | ICD-10-CM | POA: Diagnosis not present

## 2020-05-11 HISTORY — DX: Cardiac arrhythmia, unspecified: I49.9

## 2020-05-11 LAB — CBC
HCT: 36.1 % — ABNORMAL LOW (ref 39.0–52.0)
Hemoglobin: 12.6 g/dL — ABNORMAL LOW (ref 13.0–17.0)
MCH: 32 pg (ref 26.0–34.0)
MCHC: 34.9 g/dL (ref 30.0–36.0)
MCV: 91.6 fL (ref 80.0–100.0)
Platelets: 101 10*3/uL — ABNORMAL LOW (ref 150–400)
RBC: 3.94 MIL/uL — ABNORMAL LOW (ref 4.22–5.81)
RDW: 13.7 % (ref 11.5–15.5)
WBC: 5.1 10*3/uL (ref 4.0–10.5)
nRBC: 0 % (ref 0.0–0.2)

## 2020-05-11 LAB — COMPREHENSIVE METABOLIC PANEL
ALT: 112 U/L — ABNORMAL HIGH (ref 0–44)
AST: 135 U/L — ABNORMAL HIGH (ref 15–41)
Albumin: 3 g/dL — ABNORMAL LOW (ref 3.5–5.0)
Alkaline Phosphatase: 555 U/L — ABNORMAL HIGH (ref 38–126)
Anion gap: 7 (ref 5–15)
BUN: 18 mg/dL (ref 8–23)
CO2: 19 mmol/L — ABNORMAL LOW (ref 22–32)
Calcium: 8.4 mg/dL — ABNORMAL LOW (ref 8.9–10.3)
Chloride: 109 mmol/L (ref 98–111)
Creatinine, Ser: 1.18 mg/dL (ref 0.61–1.24)
GFR, Estimated: >60 mL/min (ref >60)
Glucose, Bld: 223 mg/dL — ABNORMAL HIGH (ref 70–99)
Potassium: 3.6 mmol/L (ref 3.5–5.1)
Sodium: 135 mmol/L (ref 135–145)
Total Bilirubin: 9 mg/dL — ABNORMAL HIGH (ref 0.3–1.2)
Total Protein: 6.1 g/dL — ABNORMAL LOW (ref 6.5–8.1)

## 2020-05-11 LAB — TYPE AND SCREEN
ABO/RH(D): O POS
Antibody Screen: NEGATIVE

## 2020-05-11 LAB — RESP PANEL BY RT-PCR (FLU A&B, COVID) ARPGX2
Influenza A by PCR: NEGATIVE
Influenza B by PCR: NEGATIVE
SARS Coronavirus 2 by RT PCR: NEGATIVE

## 2020-05-11 LAB — LIPASE, BLOOD: Lipase: 36 U/L (ref 11–51)

## 2020-05-11 LAB — GLUCOSE, CAPILLARY: Glucose-Capillary: 244 mg/dL — ABNORMAL HIGH (ref 70–99)

## 2020-05-11 MED ORDER — HYDROXYZINE HCL 10 MG/5ML PO SYRP
10.0000 mg | ORAL_SOLUTION | Freq: Once | ORAL | Status: AC
  Administered 2020-05-11: 10 mg via ORAL
  Filled 2020-05-11 (×2): qty 5

## 2020-05-11 MED ORDER — HYDROXYZINE HCL 10 MG/5ML PO SYRP
10.0000 mg | ORAL_SOLUTION | Freq: Three times a day (TID) | ORAL | Status: DC | PRN
  Administered 2020-05-12 – 2020-05-15 (×7): 10 mg via ORAL
  Filled 2020-05-11 (×10): qty 5

## 2020-05-11 MED ORDER — PIPERACILLIN-TAZOBACTAM 3.375 G IVPB 30 MIN
3.3750 g | Freq: Once | INTRAVENOUS | Status: AC
  Administered 2020-05-11: 3.375 g via INTRAVENOUS
  Filled 2020-05-11: qty 50

## 2020-05-11 MED ORDER — PANTOPRAZOLE SODIUM 40 MG IV SOLR: 40.0000 mg | Freq: Two times a day (BID) | INTRAVENOUS | Status: DC

## 2020-05-11 MED ORDER — ONDANSETRON HCL 4 MG/2ML IJ SOLN
4.0000 mg | Freq: Four times a day (QID) | INTRAMUSCULAR | Status: DC | PRN
  Administered 2020-05-13: 4 mg via INTRAVENOUS
  Filled 2020-05-11: qty 2

## 2020-05-11 MED ORDER — SODIUM CHLORIDE 0.9 % IV SOLN
80.0000 mg | Freq: Once | INTRAVENOUS | Status: AC
  Administered 2020-05-11: 80 mg via INTRAVENOUS
  Filled 2020-05-11: qty 80

## 2020-05-11 MED ORDER — GABAPENTIN 100 MG PO CAPS
100.0000 mg | ORAL_CAPSULE | Freq: Two times a day (BID) | ORAL | Status: DC
  Administered 2020-05-11 – 2020-05-15 (×7): 100 mg via ORAL
  Filled 2020-05-11 (×8): qty 1

## 2020-05-11 MED ORDER — INSULIN ASPART 100 UNIT/ML ~~LOC~~ SOLN
0.0000 [IU] | Freq: Every day | SUBCUTANEOUS | Status: DC
  Administered 2020-05-11 – 2020-05-12 (×2): 3 [IU] via SUBCUTANEOUS
  Filled 2020-05-11 (×2): qty 1

## 2020-05-11 MED ORDER — ACETAMINOPHEN 325 MG PO TABS: 650.0000 mg | ORAL_TABLET | Freq: Four times a day (QID) | ORAL | Status: DC | PRN

## 2020-05-11 MED ORDER — SODIUM CHLORIDE 0.9 % IV SOLN
8.0000 mg/h | INTRAVENOUS | Status: DC
  Administered 2020-05-11 – 2020-05-12 (×2): 8 mg/h via INTRAVENOUS
  Filled 2020-05-11 (×2): qty 80

## 2020-05-11 MED ORDER — HYDRALAZINE HCL 20 MG/ML IJ SOLN: 5.0000 mg | INTRAMUSCULAR | Status: DC | PRN

## 2020-05-11 MED ORDER — EZETIMIBE 10 MG PO TABS
10.0000 mg | ORAL_TABLET | Freq: Every day | ORAL | Status: DC
  Administered 2020-05-13 – 2020-05-15 (×3): 10 mg via ORAL
  Filled 2020-05-11 (×4): qty 1

## 2020-05-11 MED ORDER — CARVEDILOL 12.5 MG PO TABS
12.5000 mg | ORAL_TABLET | Freq: Two times a day (BID) | ORAL | Status: DC
  Administered 2020-05-12 – 2020-05-15 (×5): 12.5 mg via ORAL
  Filled 2020-05-11 (×6): qty 1

## 2020-05-11 MED ORDER — FINASTERIDE 5 MG PO TABS
5.0000 mg | ORAL_TABLET | Freq: Every day | ORAL | Status: DC
Start: 2020-05-12 — End: 2020-05-15
  Administered 2020-05-13 – 2020-05-15 (×3): 5 mg via ORAL
  Filled 2020-05-11 (×4): qty 1

## 2020-05-11 MED ORDER — INSULIN ASPART 100 UNIT/ML ~~LOC~~ SOLN
0.0000 [IU] | Freq: Three times a day (TID) | SUBCUTANEOUS | Status: DC
  Administered 2020-05-12: 8 [IU] via SUBCUTANEOUS
  Administered 2020-05-13: 3 [IU] via SUBCUTANEOUS
  Administered 2020-05-13: 8 [IU] via SUBCUTANEOUS
  Administered 2020-05-13: 3 [IU] via SUBCUTANEOUS
  Administered 2020-05-14: 5 [IU] via SUBCUTANEOUS
  Administered 2020-05-14: 2 [IU] via SUBCUTANEOUS
  Administered 2020-05-14 – 2020-05-15 (×3): 3 [IU] via SUBCUTANEOUS
  Filled 2020-05-11 (×9): qty 1

## 2020-05-11 MED ORDER — ACETAMINOPHEN 650 MG RE SUPP: 650.0000 mg | Freq: Four times a day (QID) | RECTAL | Status: DC | PRN

## 2020-05-11 MED ORDER — IOHEXOL 300 MG/ML  SOLN
100.0000 mL | Freq: Once | INTRAMUSCULAR | Status: AC | PRN
  Administered 2020-05-11: 100 mL via INTRAVENOUS
  Filled 2020-05-11: qty 100

## 2020-05-11 MED ORDER — ONDANSETRON HCL 4 MG PO TABS: 4.0000 mg | ORAL_TABLET | Freq: Four times a day (QID) | ORAL | Status: DC | PRN

## 2020-05-11 NOTE — Telephone Encounter (Signed)
If he is having dark stools and diarrhea then he may be bleeding and should go to the ER.

## 2020-05-11 NOTE — ED Provider Notes (Signed)
[facility] Emergency Department Provider Note    Event Date/Time   First MD Initiated Contact with Patient 05/11/20 1619     (approximate)  I have reviewed the triage vital signs and the nursing notes.   HISTORY  Chief Complaint Rectal Bleeding    HPI [name] [name] is a 87 y.o. male below listed past medical history status post recent ERCP for cholelithiasis presents to the ER for evaluation of persistent epigastric pain as well is multiple episodes of black tarry stool.  He does take daily aspirin.  Wife states that she also has noted that his eyes are starting to look more jaundiced.  He is not been having any nausea or vomiting.  Has had some chills at home but measured no measured temperatures.    Past Medical History:  Diagnosis Date  . BPH (benign prostatic hyperplasia)   . Cancer ([place])    skin  . Cataract   . Coronary artery disease    a. 4v CABG 01/2005; b. cath 06/05/13 showed patent grafts: LIMA-LAD, VG-D, VG-OM3, VG-dRCA, nl filling pressures, nl LV fxn  . Diastolic dysfunction    a. echo 04/6710: nl systolic fxn, mild LVH, diastolic relaxation abnormality, mildly enlarged LA, mild Ao insufficiency  . Dyspnea    with exertion  . ED (erectile dysfunction)   . GERD (gastroesophageal reflux disease)   . Hyperlipidemia   . Hypertension   . IDDM (insulin dependent diabetes mellitus) 2000  . Osteoarthritis   . Perirectal fistula   . Pneumonia 12/2016  . Shingles 09/04/2018  . Tubular adenoma of colon 07/2012  . Vertigo    Family History  Problem Relation Age of Onset  . Lung cancer Mother   . Heart disease Father   . Colon cancer Neg Hx   . Breast cancer Neg Hx    Past Surgical History:  Procedure Laterality Date  . BACK SURGERY    . CARDIAC CATHETERIZATION  06/05/2013   [facility]   Marland Kitchen CARDIAC CATHETERIZATION N/A 09/24/2015   Procedure: Right Heart Cath and Coronary/Graft Angiography;  Surgeon: Minna Merritts, MD;  Location: [facility];  Service: Cardiovascular;  Laterality: N/A;  . CATARACT EXTRACTION  sept 2013   right  . CATARACT EXTRACTION W/PHACO Left 03/28/2017   Procedure: CATARACT EXTRACTION PHACO AND INTRAOCULAR LENS PLACEMENT (IOC);  Surgeon: Birder Robson, MD;  Location: [facility] ORS;  Service: Ophthalmology;  Laterality: Left;  [place] 00:42.0 AP% 15.0 CDE 6.31 Fluid Pack lot # [id number]  . CHOLECYSTECTOMY  05/15/2011   Procedure: LAPAROSCOPIC CHOLECYSTECTOMY;  Surgeon: Rolm Bookbinder, MD;  Location: [facility];  Service: General;  Laterality: N/A;  . COLONOSCOPY W/ POLYPECTOMY    . CORONARY ARTERY BYPASS GRAFT  2007   x 4  . ERCP N/A 05/05/2020   Procedure: ENDOSCOPIC RETROGRADE CHOLANGIOPANCREATOGRAPHY (ERCP);  Surgeon: Lucilla Lame, MD;  Location: [facility] ENDOSCOPY;  Service: Endoscopy;  Laterality: N/A;  . EYE SURGERY    . FOOT SURGERY  2012   right foot  . JOINT REPLACEMENT  8/10   Right THR--[name]  . LEFT AND RIGHT HEART CATHETERIZATION WITH CORONARY/GRAFT ANGIOGRAM N/A 06/05/2013   Procedure: LEFT AND RIGHT HEART CATHETERIZATION WITH Beatrix Fetters;  Surgeon: Peter M Martinique, MD;  Location: [facility] CATH LAB;  Service: Cardiovascular;  Laterality: N/A;  . LUMBAR LAMINECTOMY  1989  . Prostate photovaporization  5/16   Dr Budd Palmer  . RIGHT/LEFT HEART CATH AND CORONARY/GRAFT ANGIOGRAPHY N/A 01/05/2018   Procedure: RIGHT/LEFT HEART  CATH AND CORONARY/GRAFT ANGIOGRAPHY;  Surgeon: Minna Merritts, MD;  Location: [facility];  Service: Cardiovascular;  Laterality: N/A;   Patient Active Problem List   Diagnosis Date Noted  . Choledocholithiasis   . AKI (acute kidney injury) ([place]) 04/08/2020  . Suspected COVID-19 virus infection 04/06/2020  . Candidal intertrigo 03/12/2020  . Scrotal lesion 03/12/2020  . Cellulitis 05/12/2019  . Fibula fracture 05/09/2019  . Epigastric pain 05/09/2019  . Postherpetic neuralgia 05/09/2019  . B12 deficiency 05/09/2019  . Pulmonary hypertension,  unspecified ([place]) 04/05/2019  . Proteinuria, unspecified 04/05/2019  . Atypical chest pain 01/08/2019  . Weight loss 12/01/2018  . Vertigo 10/15/2018  . Diabetic retinopathy ([place]) 09/20/2018  . Bile duct abnormality 09/12/2018  . Stable angina ([place]) 03/12/2018  . Chronic low back pain 03/05/2018  . Skin tear of hand without complication, initial encounter 03/05/2018  . Abnormal breast exam 01/18/2018  . PAC (premature atrial contraction) 01/18/2018  . Neuropathic pain 01/18/2018  . Demand ischemia ([place])   . Acute on chronic kidney failure ([place]) 01/07/2018  . Accelerated hypertension 01/07/2018  . Bell's palsy 08/18/2017  . H/O cold sores 08/18/2017  . Irregular heart beat 11/29/2016  . Insomnia 11/10/2016  . PLMD (periodic limb movement disorder) 08/17/2016  . Mild OSA 08/17/2016  . Intolerance of continuous positive airway pressure (CPAP) ventilation 08/17/2016  . Chronic arthritis 06/02/2016  . CKD (chronic kidney disease) stage 3, GFR 30-59 ml/min (HCC) 06/02/2016  . Diastolic dysfunction 25/42/7062  . Osteoarthritis 05/04/2016  . Presence of aortocoronary bypass graft 05/04/2016  . Chronic pain of both knees 10/06/2015  . Sensory abnormality of thoracic dermatome distribution 10/06/2015  . Spondylosis of lumbar region without myelopathy or radiculopathy 10/06/2015  . Unstable angina ([place]) 09/24/2015  . S/P CABG x 4   . Thrombocytopenia ([place]) 02/07/2015  . Type 2 diabetes mellitus with complication, without long-term current use of insulin ([place]) 02/06/2015  . Chronic diastolic CHF (congestive heart failure) ([place]) 01/01/2014  . CAD (coronary artery disease) 06/03/2013  . Diabetic neuropathy ([place]) 11/12/2012  . BPH (benign prostatic hyperplasia)   . GERD (gastroesophageal reflux disease)   . Hyperlipidemia 09/15/2009  . Essential hypertension 09/15/2009      Prior to Admission medications   Medication Sig Start Date End Date Taking? Authorizing Provider  amLODipine  (NORVASC) 5 MG tablet Take 1 tablet (5 mg total) by mouth daily. Patient not taking: Reported on 05/05/2020 10/21/19   Minna Merritts, MD  aspirin EC 81 MG tablet Take 81 mg by mouth every other day.    [provider]  carvedilol (COREG) 12.5 MG tablet TAKE ONE (1) TABLET BY MOUTH TWO TIMES PER DAY 04/01/20   Minna Merritts, MD  ezetimibe (ZETIA) 10 MG tablet Take 1 tablet (10 mg total) by mouth daily. 04/10/20 07/09/20  Nolberto Hanlon, MD  FARXIGA 10 MG TABS tablet Take 10 mg by mouth every morning. 04/28/20   [provider]  finasteride (PROSCAR) 5 MG tablet Take 1 tablet (5 mg total) by mouth daily. 03/11/20   [name], MD  FLUZONE HIGH-DOSE QUADRIVALENT 0.7 ML SUSY  01/01/20   [provider]  gabapentin (NEURONTIN) 100 MG capsule Take 100 mg by mouth 2 (two) times daily.    [provider]  omeprazole (PRILOSEC) 20 MG capsule TAKE 1 CAPSULE BY MOUTH TWICE DAILY 04/24/20   [name], MD  ondansetron (ZOFRAN ODT) 4 MG disintegrating tablet Take 1 tablet (4 mg total) by  mouth every 8 (eight) hours as needed for nausea or vomiting. 04/06/20   Crecencio Mc, MD  TOUJEO SOLOSTAR 300 UNIT/ML Solostar Pen INJECT 50 UNITS DAILY MAY INCREASE 2U EVERY 3 DAYS UNTIL FBG IS AT GOAL UP TO 80 UNITS DAILY 05/22/19   [name], MD  Vitamin D, Cholecalciferol, 1000 units TABS Take 1,000 Units by mouth daily.     [provider]  zolpidem (AMBIEN) 5 MG tablet Take 1 tablet (5 mg total) by mouth at bedtime as needed for sleep. Stop trazadone not helpful 02/26/19   [name], MD    Allergies Cortisone, Metformin, and Metformin and related    Social History Social History   Tobacco Use  . Smoking status: Never Smoker  . Smokeless tobacco: Never Used  Vaping Use  . Vaping Use: Never used  Substance Use Topics  . Alcohol use: Yes    Comment: occ cocktail  . Drug use: No    Review of Systems Patient denies headaches,  rhinorrhea, blurry vision, numbness, shortness of breath, chest pain, edema, cough, abdominal pain, nausea, vomiting, diarrhea, dysuria, fevers, rashes or hallucinations unless otherwise stated above in HPI. ____________________________________________   PHYSICAL EXAM:  VITAL SIGNS: Vitals:   05/11/20 1402 05/11/20 1838  BP: (!) 127/37 (!) 180/70  Pulse: 64 (!) 56  Resp: 15 16  Temp: 97.9 F (36.6 C)   SpO2: 98% 94%    Constitutional: Alert and oriented.  Eyes: Conjunctivae are normal.  Head: Atraumatic. Nose: No congestion/rhinnorhea. Mouth/Throat: Mucous membranes are moist.   Neck: No stridor. Painless ROM.  Cardiovascular: Normal rate, regular rhythm. Grossly normal heart sounds.  Good peripheral circulation. Respiratory: Normal respiratory effort.  No retractions. Lungs CTAB. Gastrointestinal: Soft with mild epigastric ttp. No distention. No abdominal bruits. No CVA tenderness. Genitourinary:  Musculoskeletal: No lower extremity tenderness nor edema.  No joint effusions. Neurologic:  Normal speech and language. No gross focal neurologic deficits are appreciated. No facial droop Skin:  Skin is warm, dry and intact. No rash noted. Psychiatric: Mood and affect are normal. Speech and behavior are normal.  ____________________________________________   LABS (all labs ordered are listed, but only abnormal results are displayed)  Results for orders placed or performed during the hospital encounter of 05/11/20 (from the past 24 hour(s))  Comprehensive metabolic panel     Status: Abnormal   Collection Time: 05/11/20  2:11 PM  Result Value Ref Range   Sodium 135 135 - 145 mmol/L   Potassium 3.6 3.5 - 5.1 mmol/L   Chloride 109 98 - 111 mmol/L   CO2 19 (L) 22 - 32 mmol/L   Glucose, Bld 223 (H) 70 - 99 mg/dL   BUN 18 8 - 23 mg/dL   Creatinine, Ser 1.18 0.61 - 1.24 mg/dL   Calcium 8.4 (L) 8.9 - 10.3 mg/dL   Total Protein 6.1 (L) 6.5 - 8.1 g/dL   Albumin 3.0 (L) 3.5 - 5.0 g/dL    AST 135 (H) 15 - 41 U/L   ALT 112 (H) 0 - 44 U/L   Alkaline Phosphatase 555 (H) 38 - 126 U/L   Total Bilirubin 9.0 (H) 0.3 - 1.2 mg/dL   GFR, Estimated >60 >60 mL/min   Anion gap 7 5 - 15  CBC     Status: Abnormal   Collection Time: 05/11/20  2:11 PM  Result Value Ref Range   WBC 5.1 4.0 - 10.5 K/uL   RBC 3.94 (L) 4.22 - 5.81 MIL/uL  Hemoglobin 12.6 (L) 13.0 - 17.0 g/dL   HCT 36.1 (L) 39.0 - 52.0 %   MCV 91.6 80.0 - 100.0 fL   MCH 32.0 26.0 - 34.0 pg   MCHC 34.9 30.0 - 36.0 g/dL   RDW 13.7 11.5 - 15.5 %   Platelets 101 (L) 150 - 400 K/uL   nRBC 0.0 0.0 - 0.2 %  Type and screen [place]     Status: None   Collection Time: 05/11/20  2:11 PM  Result Value Ref Range   ABO/RH(D) O POS    Antibody Screen NEG    Sample Expiration      05/14/2020,2359 Performed at [facility], [address] [place], [place] [address]   Lipase, blood     Status: None   Collection Time: 05/11/20  2:11 PM  Result Value Ref Range   Lipase 36 11 - 51 U/L  Resp Panel by RT-PCR (Flu A&B, Covid) Nasopharyngeal Swab     Status: None   Collection Time: 05/11/20  5:30 PM   Specimen: Nasopharyngeal Swab; Nasopharyngeal(NP) swabs in vial transport medium  Result Value Ref Range   SARS Coronavirus 2 by RT PCR NEGATIVE NEGATIVE   Influenza A by PCR NEGATIVE NEGATIVE   Influenza B by PCR NEGATIVE NEGATIVE   ____________________________________________  ____________________________________________  RADIOLOGY  I personally reviewed all radiographic images ordered to evaluate for the above acute complaints and reviewed radiology reports and findings.  These findings were personally discussed with the patient.  Please see medical record for radiology report.  ____________________________________________   PROCEDURES  Procedure(s) performed:  Procedures    Critical Care performed: no ____________________________________________   INITIAL IMPRESSION /  ASSESSMENT AND PLAN / ED COURSE  Pertinent labs & imaging results that were available during my care of the patient were reviewed by me and considered in my medical decision making (see chart for details).   DDX: Cholangitis, pancreatitis, gallstone pancreatitis, perforation, PUD, L GIB, hepatitis  [name] [name] is a 87 y.o. who presents to the ED with agitation as described above.  Patient with evidence of worsening transaminitis and elevated bilirubin status post recent ERCP.  Case discussed in consultation with Dr. Marius Ditch.  Is not febrile here no white count but was reported fevers at home.  Has recommended Zosyn.  Given his multiple melanotic stools is recommended Protonix infusion.  Clinical Course as of 05/11/20 1854  Mon May 11, 2020  1625 Alkaline Phosphatase(!): 555 [PR]  1854 CT imaging is reassuring.  Patient remains hemodynamically stable.  No perforation by CT.  Will discuss with hospitalist for admission. [PR]    Clinical Course User Index [PR] Merlyn Lot, MD    The patient was evaluated in Emergency Department today for the symptoms described in the history of present illness. He/she was evaluated in the context of the global COVID-19 pandemic, which necessitated consideration that the patient might be at risk for infection with the SARS-CoV-2 virus that causes COVID-19. Institutional protocols and algorithms that pertain to the evaluation of patients at risk for COVID-19 are in a state of rapid change based on information released by regulatory bodies including the CDC and federal and state organizations. These policies and algorithms were followed during the patient's care in the ED.  As part of my medical decision making, I reviewed the following data within the [facility] notes reviewed and incorporated, Labs reviewed, notes from prior ED visits and [place] Controlled Substance Database  ____________________________________________   FINAL  CLINICAL IMPRESSION(S) / ED DIAGNOSES  Final diagnoses:  Melena  Transaminitis      NEW MEDICATIONS STARTED DURING THIS VISIT:  New Prescriptions   No medications on file     Note:  This document was prepared using Dragon voice recognition software and may include unintentional dictation errors.    Merlyn Lot, MD 05/11/20 [phone]

## 2020-05-11 NOTE — Telephone Encounter (Signed)
Patient called Access Nurse line on 05/09/20 with Complaint of.   --Caller states he had been in the hospital because of a blockage in his bile duct. He had surgery on Tuesday to have the stone removed. He has had nausea and diarrhea. When he was in the hospital the GI doctors stopped several of his medications, including his BP, cholesterol, and water pills. He does not know why they stopped them. His BP is 180/91 and he wants to know if he should restart his medication.  Patient was advised to call GI this morning and findout as to restarting medication and concern for [place] stools and diarrhea, patient has called GI and awaiting call back from Dr. Allen Norris.

## 2020-05-11 NOTE — Telephone Encounter (Signed)
Thayer Headings called stating having chronic diarrhea this weekend and has now turned to very dark stools. Please advise patient.

## 2020-05-11 NOTE — H&P (Signed)
History and Physical    [name] [name] WUJ:[id number] DOB: 1934/01/17 DOA: 05/11/2020  PCP: Glori Luis, MD    Patient coming from:  Home   Chief Complaint:  Melena   HPI: [name] [name] is a 87 y.o. male seen in ed for 3 days of  melena intermittent and one episode of BRBPR. Notes abdominal pain when abd  palpated or feel his abdomen. No diet changes or foods or pepto bismol. and jaundice. Yellow eyes since weekend. Urine is dark in color, orange or pumpkin color.Pain is intermittent and radiates from epigastric to left upper quadrant. Denies any vomiting. ROS as below.  Dr. Allegra Lai GI and Dr. Servando Snare aware and will see pt in am. Pt is three days of melena. Pt has ERCP with stone extraction and sphincterotomy on march 1st.See report:- Choledocholithiasis was found. Complete removal was accomplished by biliary sphincterotomy and balloon extraction. A biliary sphincterotomy was performed.The biliary tree was swept.  Pt has PMH of CAD, HTN, Hyperlipidemia, CKD. ED Course:  Vitals:   05/11/20 1402 05/11/20 1403 05/11/20 1838  BP: (!) 127/37  (!) 180/70  Pulse: 64  (!) 56  Resp: 15  16  Temp: 97.9 F (36.6 C)    TempSrc: Oral    SpO2: 98%  94%  Weight:  94.3 kg   Height:  6\' 1"  (1.854 m)   Pt in ed is alert/oriented and hypertensive. Gives history. Afebrile but has been having fevers at home.labs today show hyperglycemia/ elevated alk phos of 555, ast of 135 and alt of 112, T.Bili of 9.0, hb of 12.6, CT of abd/pelvis with contrast is negative.     Review of Systems:  Review of Systems  Constitutional: Positive for fever and malaise/fatigue.  HENT: Negative.   Eyes: Negative.   Respiratory: Negative.   Cardiovascular: Negative.   Gastrointestinal: Positive for abdominal pain, blood in stool, diarrhea, melena and nausea.  Genitourinary: Negative.   Musculoskeletal: Negative.   Skin: Positive for itching.  Neurological: Negative.   Endo/Heme/Allergies: Negative.    Psychiatric/Behavioral: Negative.      Past Medical History:  Diagnosis Date  . BPH (benign prostatic hyperplasia)   . Cancer (HCC)    skin  . Cataract   . Coronary artery disease    a. 4v CABG 01/2005; b. cath 06/05/13 showed patent grafts: LIMA-LAD, VG-D, VG-OM3, VG-dRCA, nl filling pressures, nl LV fxn  . Diastolic dysfunction    a. echo 06/2007: nl systolic fxn, mild LVH, diastolic relaxation abnormality, mildly enlarged LA, mild Ao insufficiency  . Dyspnea    with exertion  . ED (erectile dysfunction)   . GERD (gastroesophageal reflux disease)   . Hyperlipidemia   . Hypertension   . IDDM (insulin dependent diabetes mellitus) 2000  . Osteoarthritis   . Perirectal fistula   . Pneumonia 12/2016  . Shingles 09/04/2018  . Tubular adenoma of colon 07/2012  . Vertigo     Past Surgical History:  Procedure Laterality Date  . BACK SURGERY    . CARDIAC CATHETERIZATION  06/05/2013   [facility]   08/05/2013 CARDIAC CATHETERIZATION N/A 09/24/2015   Procedure: Right Heart Cath and Coronary/Graft Angiography;  Surgeon: 09/26/2015, MD;  Location: [facility] INVASIVE CV LAB;  Service: Cardiovascular;  Laterality: N/A;  . CATARACT EXTRACTION  sept 2013   right  . CATARACT EXTRACTION W/PHACO Left 03/28/2017   Procedure: CATARACT EXTRACTION PHACO AND INTRAOCULAR LENS PLACEMENT (IOC);  Surgeon: 03/30/2017, MD;  Location: [facility] ORS;  Service: Ophthalmology;  Laterality: Left;  [place] 00:42.0 AP% 15.0 CDE 6.31 Fluid Pack lot # [id number]  . CHOLECYSTECTOMY  05/15/2011   Procedure: LAPAROSCOPIC CHOLECYSTECTOMY;  Surgeon: Rolm Bookbinder, MD;  Location: [facility];  Service: General;  Laterality: N/A;  . COLONOSCOPY W/ POLYPECTOMY    . CORONARY ARTERY BYPASS GRAFT  2007   x 4  . ERCP N/A 05/05/2020   Procedure: ENDOSCOPIC RETROGRADE CHOLANGIOPANCREATOGRAPHY (ERCP);  Surgeon: Lucilla Lame, MD;  Location: [facility] ENDOSCOPY;  Service: Endoscopy;  Laterality: N/A;  . EYE SURGERY    . FOOT SURGERY  2012   right  foot  . JOINT REPLACEMENT  8/10   Right THR--[name]  . LEFT AND RIGHT HEART CATHETERIZATION WITH CORONARY/GRAFT ANGIOGRAM N/A 06/05/2013   Procedure: LEFT AND RIGHT HEART CATHETERIZATION WITH Beatrix Fetters;  Surgeon: Peter M Martinique, MD;  Location[facility] CATH LAB;  Service: Cardiovascular;  Laterality: N/A;  . LUMBAR LAMINECTOMY  1989  . Prostate photovaporization  5/16   Dr Budd Palmer  . RIGHT/LEFT HEART CATH AND CORONARY/GRAFT ANGIOGRAPHY N/A 01/05/2018   Procedure: RIGHT/LEFT HEART CATH AND CORONARY/GRAFT ANGIOGRAPHY;  Surgeon: Minna Merritts, MD;  Location: [place] CV LAB;  Service: Cardiovascular;  Laterality: N/A;     reports that he has never smoked. He has never used smokeless tobacco. He reports current alcohol use. He reports that he does not use drugs.  Allergies  Allergen Reactions  . Cortisone Other (See Comments)    Reaction: Leg Cramps  . Metformin Diarrhea  . Metformin And Related Diarrhea    Family History  Problem Relation Age of Onset  . Lung cancer Mother   . Heart disease Father   . Colon cancer Neg Hx   . Breast cancer Neg Hx     Prior to Admission medications   Medication Sig Start Date End Date Taking? Authorizing Provider  amLODipine (NORVASC) 5 MG tablet Take 1 tablet (5 mg total) by mouth daily. Patient not taking: Reported on 05/05/2020 10/21/19   Minna Merritts, MD  aspirin EC 81 MG tablet Take 81 mg by mouth every other day.    [provider]  carvedilol (COREG) 12.5 MG tablet TAKE ONE (1) TABLET BY MOUTH TWO TIMES PER DAY 04/01/20   Minna Merritts, MD  ezetimibe (ZETIA) 10 MG tablet Take 1 tablet (10 mg total) by mouth daily. 04/10/20 07/09/20  Nolberto Hanlon, MD  FARXIGA 10 MG TABS tablet Take 10 mg by mouth every morning. 04/28/20   [provider]  finasteride (PROSCAR) 5 MG tablet Take 1 tablet (5 mg total) by mouth daily. 03/11/20   [name], MD  FLUZONE HIGH-DOSE QUADRIVALENT 0.7 ML SUSY  01/01/20    [provider]  gabapentin (NEURONTIN) 100 MG capsule Take 100 mg by mouth 2 (two) times daily.    [provider]  omeprazole (PRILOSEC) 20 MG capsule TAKE 1 CAPSULE BY MOUTH TWICE DAILY 04/24/20   [name], MD  ondansetron (ZOFRAN ODT) 4 MG disintegrating tablet Take 1 tablet (4 mg total) by mouth every 8 (eight) hours as needed for nausea or vomiting. 04/06/20   Crecencio Mc, MD  TOUJEO SOLOSTAR 300 UNIT/ML Solostar Pen INJECT 50 UNITS DAILY MAY INCREASE 2U EVERY 3 DAYS UNTIL FBG IS AT GOAL UP TO 80 UNITS DAILY 05/22/19   [name], MD  Vitamin D, Cholecalciferol, 1000 units TABS Take 1,000 Units by mouth daily.     [provider]  zolpidem (AMBIEN) 5 MG tablet Take 1  tablet (5 mg total) by mouth at bedtime as needed for sleep. Stop trazadone not helpful 02/26/19   [name], MD    Physical Exam: Vitals:   05/11/20 1402 05/11/20 1403 05/11/20 1838  BP: (!) 127/37  (!) 180/70  Pulse: 64  (!) 56  Resp: 15  16  Temp: 97.9 F (36.6 C)    TempSrc: Oral    SpO2: 98%  94%  Weight:  94.3 kg   Height:  6' 1"  (1.854 m)     Physical Exam Vitals and nursing note reviewed.  Constitutional:      Appearance: Normal appearance. He is well-developed. He is not ill-appearing.  HENT:     Head: Normocephalic and atraumatic.     Right Ear: Hearing and external ear normal.     Left Ear: Hearing and external ear normal.     Nose: Nose normal.     Mouth/Throat:     Mouth: Mucous membranes are moist.     Tongue: No lesions.     Pharynx: Oropharynx is clear.  Eyes:     General: Lids are normal. Scleral icterus present.     Extraocular Movements: Extraocular movements intact.     Right eye: Normal extraocular motion and no nystagmus.     Left eye: Normal extraocular motion and no nystagmus.     Pupils: Pupils are equal, round, and reactive to light.  Neck:     Vascular: No carotid bruit.  Cardiovascular:     Rate and Rhythm: Normal rate  and regular rhythm.     Pulses: Normal pulses.     Heart sounds: Normal heart sounds.  Pulmonary:     Effort: Pulmonary effort is normal.     Breath sounds: Normal breath sounds.  Abdominal:     General: Bowel sounds are decreased. There is no distension.     Palpations: Abdomen is soft. There is no mass.     Tenderness: There is abdominal tenderness in the epigastric area. There is no guarding.     Hernia: No hernia is present.    Musculoskeletal:     Right lower leg: No edema.     Left lower leg: 1+ Edema present.  Skin:    Coloration: Skin is jaundiced.  Neurological:     General: No focal deficit present.     Mental Status: He is alert and oriented to person, place, and time.     Cranial Nerves: Cranial nerves are intact.     Motor: Motor function is intact.  Psychiatric:        Mood and Affect: Mood normal.        Behavior: Behavior normal. Behavior is cooperative.      Labs on Admission: I have personally reviewed following labs and imaging studies Labs  No results for input(s): CKTOTAL, CKMB, TROPONINI in the last 72 hours. Lab Results  Component Value Date   WBC 5.1 05/11/2020   HGB 12.6 (L) 05/11/2020   HCT 36.1 (L) 05/11/2020   MCV 91.6 05/11/2020   PLT 101 (L) 05/11/2020    Recent Labs  Lab 05/11/20 1411  NA 135  K 3.6  CL 109  CO2 19*  BUN 18  CREATININE 1.18  CALCIUM 8.4*  PROT 6.1*  BILITOT 9.0*  ALKPHOS 555*  ALT 112*  AST 135*  GLUCOSE 223*   Alk Phos trends: Results for SHOWN, DISSINGER (MRN [id number]) as of 05/11/2020 18:57  Ref. Range 04/09/2020 08:26 04/09/2020 08:26 04/10/2020 08:32 04/15/2020 16:03  04/27/2020 09:30 05/11/2020 14:11  Alkaline Phosphatase Latest Ref Range: 38 - 126 U/L 253 (H) 322 (H) 359 (H) 398 (H) 285 (H) 555 (H)    AST/ ALT trends: Results for AYDIN, HINK (MRN [id number]) as of 05/11/2020 18:57  Ref. Range 04/09/2020 08:26 04/10/2020 08:32 04/15/2020 16:03 04/27/2020 09:30 05/11/2020 14:11  AST Latest Ref Range: 15 - 41 U/L 46  (H) 112 (H) 40 23 135 (H)  ALT Latest Ref Range: 0 - 44 U/L 72 (H) 91 (H) 52 (H) 25 112 (H)   Lab Results  Component Value Date   CHOL 93 03/11/2020   HDL 37.60 (L) 03/11/2020   LDLCALC 23 03/11/2020   TRIG 163.0 (H) 03/11/2020   No results found for: DDIMER Invalid input(s): POCBNP  Urinalysis    Component Value Date/Time   COLORURINE AMBER (A) 04/08/2020 1823   APPEARANCEUR HAZY (A) 04/08/2020 1823   APPEARANCEUR Clear 12/27/2017 0943   LABSPEC 1.012 04/08/2020 1823   LABSPEC 1.018 02/22/2013 2122   PHURINE 5.0 04/08/2020 1823   GLUCOSEU 150 (A) 04/08/2020 1823   GLUCOSEU Negative 02/22/2013 2122   HGBUR NEGATIVE 04/08/2020 1823   BILIRUBINUR NEGATIVE 04/08/2020 1823   BILIRUBINUR small 12/07/2018 1118   BILIRUBINUR Negative 12/27/2017 0943   BILIRUBINUR Negative 02/22/2013 2122   KETONESUR NEGATIVE 04/08/2020 1823   PROTEINUR NEGATIVE 04/08/2020 1823   UROBILINOGEN 0.2 12/07/2018 1118   UROBILINOGEN 0.2 06/03/2013 2218   NITRITE NEGATIVE 04/08/2020 1823   LEUKOCYTESUR NEGATIVE 04/08/2020 1823   LEUKOCYTESUR Negative 02/22/2013 2122   Lab Results  Component Value Date   SARSCOV2NAA NEGATIVE 05/11/2020   [place] NEGATIVE 05/01/2020   [place] NEGATIVE 04/08/2020   SARSCOV2NAA NOT DETECTED 09/07/2018    Radiological Exams on Admission: CT ABDOMEN PELVIS W CONTRAST  Result Date: 05/11/2020 CLINICAL DATA:  History of prior cholecystectomy and cholelithiasis with recent ERCP with new onset diarrhea and tarry stools EXAM: CT ABDOMEN AND PELVIS WITH CONTRAST TECHNIQUE: Multidetector CT imaging of the abdomen and pelvis was performed using the standard protocol following bolus administration of intravenous contrast. CONTRAST:  167m OMNIPAQUE IOHEXOL 300 MG/ML  SOLN COMPARISON:  04/08/2020 FINDINGS: Lower chest: No acute abnormality. Hepatobiliary: Mild fatty infiltration of the liver is noted. The gallbladder has been surgically removed. Pancreas: Unremarkable. No  pancreatic ductal dilatation or surrounding inflammatory changes. Spleen: Normal in size without focal abnormality. Adrenals/Urinary Tract: Adrenal glands are within normal limits. Kidneys demonstrate a normal enhancement pattern bilaterally. Renal cysts are noted on the left stable from the prior exam. No calculi or obstructive changes are seen. Ureters are within normal limits. The bladder is partially distended. Stomach/Bowel: The appendix is within normal limits. No obstructive or inflammatory changes of the colon are seen. The stomach and small bowel are within normal limits. Vascular/Lymphatic: Aortic atherosclerosis. No enlarged abdominal or pelvic lymph nodes. Reproductive: Prostate is prominent indenting upon the inferior aspect of the bladder. Other: No abdominal wall hernia or abnormality. No abdominopelvic ascites. Musculoskeletal: Right hip replacement is seen. Degenerative changes of lumbar spine are noted. IMPRESSION: Changes of prior cholecystectomy are noted. No acute abnormality is seen. Stable renal cystic change on the left and prostatic enlargement. Electronically Signed   By: MInez CatalinaM.D.   On: 05/11/2020 18:23    EKG: Independently reviewed.  None   ECHO: 01/2018: Study Conclusions  - Left ventricle: The cavity size was normal. There was mild  concentric hypertrophy. Systolic function was normal. The  estimated ejection fraction was in the range of  55% to 60%. Wall  motion was normal; there were no regional wall motion  abnormalities. Features are consistent with a pseudonormal left  ventricular filling pattern, with concomitant abnormal relaxation  and increased filling pressure (grade 2 diastolic dysfunction).  - Aortic valve: There was trivial regurgitation.  - Mitral valve: Calcified annulus.  - Left atrium: The atrium was mildly dilated.  - Pulmonary arteries: Systolic pressure was mildly increased.    Assessment/Plan Principal Problem:    Melena Active Problems:   Jaundice   Essential hypertension   BPH (benign prostatic hyperplasia)   GERD (gastroesophageal reflux disease)   CAD (coronary artery disease)   Chronic diastolic CHF (congestive heart failure) (HCC)   Type 2 diabetes mellitus with complication, without long-term current use of insulin (HCC)   CKD (chronic kidney disease) stage 3, GFR 30-59 ml/min (HCC)   Melena: Attribute to recent procedure or new onset GIB. Clear liquid diet then NPO after midnight.  Pt started on iv ppi and type and screen. GI consulted.  Jaundice/ Hyperbilirubinemia/ Pruritus: Pt has jaundice and icterus and pruritus for past few days asa well. Urine is dark orange in color. Atarax for itching. Pt has also been having low grade temperature and was started on Zosyn for gram positive/ gram negative/ anaerobic coverage.   HTN: PRN hydralazine.  Cont coreg.  BPH: Cont tamsulosin.  GERD: Iv ppi.  CAD: Stable no chest pain or sob or doe or any anginal or anginal equivalent symptoms.  We will cont coreg, asa held due to melena.  C/H Diastolic CHF: Stable ehco in 2019 and we will cont coreg and no statin on board. PCP to consider lipid panel and statin therapy.   DM II: Hold toujeo/ farxiga and ssi coverage until pt is evaluated for his biliary  Issue. Last a1c is 7.4 per patient.  CKD: Lab Results  Component Value Date   CREATININE 1.18 05/11/2020   CREATININE 1.41 (H) 04/27/2020   CREATININE 1.29 (H) 04/15/2020  Creatinine has improve today. We will limit contrast studies unless necessary.  Renally dose meds as needed.   DVT prophylaxis:  heparin   Code Status:  Full code   Family Communication:  Whitlatch,Janice (Spouse)  [phone] (Home Phone)   Disposition Plan:  Home  Consults called:  GI: Dr. Marius Ditch and Dr. Allen Norris.   Admission status: Inpatient.  Para Skeans MD [facility] [phone] How to contact the [facility] Attending or Consulting  provider:  1. Check the care team in [facility] and look for a) attending/consulting [facility] provider listed and b) the [facility] team listed 2. Log into [email or web address] and use [place]'s universal password to access. If you do not have the password, please contact the hospital operator. 3. Locate the [facility] provider you are looking for under [facility] and page to a number that you can be directly reached. 4. If you still have difficulty reaching the provider, please page the [facility] (Director on Call) for the Hospitalists listed on amion for assistance. [email or web address] Password [facility] 05/11/2020, 7:37 PM

## 2020-05-11 NOTE — Telephone Encounter (Signed)
Advised patient of need to go  To ER he voiced understanding that he will go but is reluctant to go. Will monitor for arrival.

## 2020-05-11 NOTE — Telephone Encounter (Signed)
Went to return pt's call to advise per Dr. Allen Norris, he needed to go to the ER but pt is currently checked in at [facility] ED.

## 2020-05-11 NOTE — Telephone Encounter (Signed)
Patient had procedure on 05/05/20 with Dr Allen Norris, patient LVM that he is having diarrhea, black stools and high BP since procedure.  Patient also states that he is a patient of Dr Humberto Leep.

## 2020-05-11 NOTE — Telephone Encounter (Signed)
Noted.  It looks like Dr. Allen Norris responded to that message regarding this and advised the patient to go to the emergency department.  I am not sure if anybody has reached out to the patient yet regarding this.  I would agree that he should be seen in the ED with dark stools following the procedure that he had as there could be a bleed.  Please contact him and let them know this so that he goes to get looked at as soon as possible.

## 2020-05-11 NOTE — Telephone Encounter (Signed)
Patient was advised to go to the ED by Dr. Allen Norris and I saw that he is at the ED right now.

## 2020-05-11 NOTE — ED Triage Notes (Signed)
Reports having a stone removed from biliary system last Tuesday and since Thursday c/o black stools.  AAOx3.  Skin warm and dry. NAD

## 2020-05-12 ENCOUNTER — Encounter
Admission: EM | Disposition: A | Discharge status: Home / Self Care | Payer: Self-pay | Source: Home / Self Care | Attending: Student

## 2020-05-12 ENCOUNTER — Inpatient Hospital Stay: Payer: Medicare Other | Admitting: Anesthesiology

## 2020-05-12 ENCOUNTER — Inpatient Hospital Stay: Payer: Medicare Other

## 2020-05-12 ENCOUNTER — Other Ambulatory Visit: Payer: Self-pay

## 2020-05-12 DIAGNOSIS — R17 Unspecified jaundice: Secondary | ICD-10-CM

## 2020-05-12 DIAGNOSIS — R7989 Other specified abnormal findings of blood chemistry: Secondary | ICD-10-CM

## 2020-05-12 DIAGNOSIS — K921 Melena: Principal | ICD-10-CM

## 2020-05-12 DIAGNOSIS — I5032 Chronic diastolic (congestive) heart failure: Secondary | ICD-10-CM | POA: Diagnosis not present

## 2020-05-12 DIAGNOSIS — R7401 Elevation of levels of liver transaminase levels: Secondary | ICD-10-CM | POA: Diagnosis not present

## 2020-05-12 HISTORY — PX: ERCP: SHX5425

## 2020-05-12 HISTORY — PX: ESOPHAGOGASTRODUODENOSCOPY: SHX5428

## 2020-05-12 LAB — COMPREHENSIVE METABOLIC PANEL
ALT: 98 U/L — ABNORMAL HIGH (ref 0–44)
AST: 109 U/L — ABNORMAL HIGH (ref 15–41)
Albumin: 2.7 g/dL — ABNORMAL LOW (ref 3.5–5.0)
Alkaline Phosphatase: 524 U/L — ABNORMAL HIGH (ref 38–126)
Anion gap: 7 (ref 5–15)
BUN: 14 mg/dL (ref 8–23)
CO2: 21 mmol/L — ABNORMAL LOW (ref 22–32)
Calcium: 8.5 mg/dL — ABNORMAL LOW (ref 8.9–10.3)
Chloride: 111 mmol/L (ref 98–111)
Creatinine, Ser: 0.97 mg/dL (ref 0.61–1.24)
GFR, Estimated: >60 mL/min (ref >60)
Glucose, Bld: 78 mg/dL (ref 70–99)
Potassium: 3.5 mmol/L (ref 3.5–5.1)
Sodium: 139 mmol/L (ref 135–145)
Total Bilirubin: 9 mg/dL — ABNORMAL HIGH (ref 0.3–1.2)
Total Protein: 5.6 g/dL — ABNORMAL LOW (ref 6.5–8.1)

## 2020-05-12 LAB — CBC
HCT: 34.1 % — ABNORMAL LOW (ref 39.0–52.0)
Hemoglobin: 12.3 g/dL — ABNORMAL LOW (ref 13.0–17.0)
MCH: 32.7 pg (ref 26.0–34.0)
MCHC: 36.1 g/dL — ABNORMAL HIGH (ref 30.0–36.0)
MCV: 90.7 fL (ref 80.0–100.0)
Platelets: 100 10*3/uL — ABNORMAL LOW (ref 150–400)
RBC: 3.76 MIL/uL — ABNORMAL LOW (ref 4.22–5.81)
RDW: 14 % (ref 11.5–15.5)
WBC: 5.8 10*3/uL (ref 4.0–10.5)
nRBC: 0 % (ref 0.0–0.2)

## 2020-05-12 LAB — GLUCOSE, CAPILLARY
Glucose-Capillary: 75 mg/dL (ref 70–99)
Glucose-Capillary: 257 mg/dL — ABNORMAL HIGH (ref 70–99)
Glucose-Capillary: 298 mg/dL — ABNORMAL HIGH (ref 70–99)

## 2020-05-12 SURGERY — ERCP, WITH INTERVENTION IF INDICATED
Anesthesia: General

## 2020-05-12 MED ORDER — PROPOFOL 500 MG/50ML IV EMUL
INTRAVENOUS | Status: DC | PRN
  Administered 2020-05-12: 100 ug/kg/min via INTRAVENOUS

## 2020-05-12 MED ORDER — LIDOCAINE 2% (20 MG/ML) 5 ML SYRINGE
INTRAMUSCULAR | Status: DC | PRN
  Administered 2020-05-12: 25 mg via INTRAVENOUS

## 2020-05-12 MED ORDER — LACTATED RINGERS IV SOLN: INTRAVENOUS | Status: DC

## 2020-05-12 MED ORDER — SODIUM CHLORIDE 0.9 % IV SOLN: INTRAVENOUS | Status: DC

## 2020-05-12 MED ORDER — EPHEDRINE 5 MG/ML INJ
INTRAVENOUS | Status: AC
  Filled 2020-05-12: qty 10

## 2020-05-12 MED ORDER — INDOMETHACIN 50 MG RE SUPP: 100.0000 mg | Freq: Once | RECTAL | Status: DC

## 2020-05-12 MED ORDER — OXYCODONE HCL 5 MG PO TABS
5.0000 mg | ORAL_TABLET | Freq: Three times a day (TID) | ORAL | Status: DC | PRN
  Administered 2020-05-12 – 2020-05-14 (×5): 5 mg via ORAL
  Filled 2020-05-12 (×5): qty 1

## 2020-05-12 MED ORDER — PROPOFOL 10 MG/ML IV BOLUS
INTRAVENOUS | Status: DC | PRN
  Administered 2020-05-12: 50 mg via INTRAVENOUS

## 2020-05-12 MED ORDER — GLYCOPYRROLATE 0.2 MG/ML IJ SOLN
INTRAMUSCULAR | Status: DC | PRN
  Administered 2020-05-12: .2 mg via INTRAVENOUS

## 2020-05-12 NOTE — Anesthesia Postprocedure Evaluation (Signed)
Anesthesia Post Note  Patient: [name] [name]  Procedure(s) Performed: ENDOSCOPIC RETROGRADE CHOLANGIOPANCREATOGRAPHY (ERCP) (N/A ) ESOPHAGOGASTRODUODENOSCOPY (EGD) (N/A )  Patient location during evaluation: Endoscopy Anesthesia Type: General Level of consciousness: awake and alert Pain management: pain level controlled Vital Signs Assessment: post-procedure vital signs reviewed and stable Respiratory status: spontaneous breathing and respiratory function stable Cardiovascular status: stable Anesthetic complications: no   No complications documented.   Last Vitals:  Vitals:   05/12/20 1039 05/12/20 1122  BP: (!) 195/64 133/62  Pulse: (!) 50 69  Resp: 16 10  Temp: 36.6 C (!) 36.3 C  SpO2: 100% 99%    Last Pain:  Vitals:   05/12/20 1122  TempSrc: Temporal  PainSc: Asleep                 Madonna Flegal,Danthony K

## 2020-05-12 NOTE — Progress Notes (Addendum)
Progress Note    JONAHTAN MANSEAU  NGE:[id number] DOB: 01-Apr-1933  DOA: 05/11/2020 PCP: [name], MD      Brief Narrative:    Medical records reviewed and are as summarized below:  BRADD MERLOS is a 87 y.o. male with medical history significant for BPH, hypertension, CAD, chronic diastolic CHF, type II DM, CKD stage IIIa, choledocholithiasis s/p ERCP with biliary sphincterectomy and balloon extraction on 05/05/2020. He presented to the hospital because of 3-day history of melena.  He was admitted to the hospital for acute GI bleeding and elevated liver enzymes with jaundice.    Assessment/Plan:   Principal Problem:   Melena Active Problems:   Essential hypertension   BPH (benign prostatic hyperplasia)   GERD (gastroesophageal reflux disease)   CAD (coronary artery disease)   Chronic diastolic CHF (congestive heart failure) (HCC)   Type 2 diabetes mellitus with complication, without long-term current use of insulin (HCC)   CKD (chronic kidney disease) stage 3, GFR 30-59 ml/min (HCC)   Jaundice     Body mass index is 27.56 kg/m.    Acute GI bleeding/melena: s/p ERCP. Continue IV Protonix infusion. Monitor H&H. Follow-up with gastroenterologist for further recommendations.  Elevated liver enzymes/jaundice: s/p ERCP today and findings are as follows: small hiatal hernia, normal stomach, normal duodenum prior biliary sphincterotomy appeared open, the biliary tree was swept and nothing was found.  s/p ERCP with biliary sphincterectomy and balloon extraction of choledocholithiasis on 05/05/2020. Monitor liver enzymes and follow-up with GI.  Chronic diastolic CHF: Compensated  CAD: Aspirin on hold  Other comorbidities include CKD stage III, type II DM, CAD, BPH, hypertension   Diet Order            Diet full liquid Room service appropriate? Yes; Fluid consistency: Thin  Diet effective now                     Consultants:  Gastroenterologist  Procedures:  Plan for endoscopy today    Medications:   . [MAR Hold] carvedilol  12.5 mg Oral BID WC  . [MAR Hold] ezetimibe  10 mg Oral Daily  . [MAR Hold] finasteride  5 mg Oral Daily  . [MAR Hold] gabapentin  100 mg Oral BID  . [MAR Hold] insulin aspart  0-15 Units Subcutaneous TID WC  . [MAR Hold] insulin aspart  0-5 Units Subcutaneous QHS   Continuous Infusions:    Anti-infectives (From admission, onward)   Start     Dose/Rate Route Frequency Ordered Stop   05/11/20 1730  piperacillin-tazobactam (ZOSYN) IVPB 3.375 g        3.375 g 100 mL/hr over 30 Minutes Intravenous  Once 05/11/20 1729 05/11/20 2141             Family Communication/Anticipated D/C date and plan/Code Status   DVT prophylaxis: SCDs Start: 05/11/20 1952     Code Status: Full Code  Family Communication: His wife at the bedside Disposition Plan:    Status is: Inpatient  Remains inpatient appropriate because:IV treatments appropriate due to intensity of illness or inability to take PO and Inpatient level of care appropriate due to severity of illness   Dispo: The patient is from: Home              Anticipated d/c is to: Home              Patient currently is not medically stable to d/c.   Difficult to place patient  No           Subjective:   Interval events noted.  No bowel movement or melena since yesterday.  No abdominal pain no hematemesis.  Objective:    Vitals:   05/12/20 1039 05/12/20 1122 05/12/20 1132 05/12/20 1142  BP: (!) 195/64 133/62 (!) 156/73 (!) 146/52  Pulse: (!) 50 69 66 64  Resp: 16 10 12 16   Temp: 97.8 F (36.6 C) (!) 97.4 F (36.3 C)    TempSrc: Temporal Temporal    SpO2: 100% 99% 99% 98%  Weight:      Height:       No data found.   Intake/Output Summary (Last 24 hours) at 05/12/2020 1146 Last data filed at 05/12/2020 1111 Gross per 24 hour  Intake 423.5 ml  Output --  Net 423.5 ml   Filed  Weights   05/11/20 1403 05/11/20 2100  Weight: 94.3 kg 94.8 kg    Exam:  GEN: NAD SKIN: No rash EYES: EOMI ENT: MMM CV: RRR PULM: CTA B ABD: soft, obese, NT, +BS, + midline abdominal hernia CNS: AAO x 3, non focal EXT: No edema or tenderness        Data Reviewed:   I have personally reviewed following labs and imaging studies:  Labs: Labs show the following:   Basic Metabolic Panel: Recent Labs  Lab 05/11/20 1411 05/12/20 0923  NA 135 139  K 3.6 3.5  CL 109 111  CO2 19* 21*  GLUCOSE 223* 78  BUN 18 14  CREATININE 1.18 0.97  CALCIUM 8.4* 8.5*   GFR Estimated Creatinine Clearance: 64.1 mL/min (by C-G formula based on SCr of 0.97 mg/dL). Liver Function Tests: Recent Labs  Lab 05/11/20 1411 05/12/20 0923  AST 135* 109*  ALT 112* 98*  ALKPHOS 555* 524*  BILITOT 9.0* 9.0*  PROT 6.1* 5.6*  ALBUMIN 3.0* 2.7*   Recent Labs  Lab 05/11/20 1411  LIPASE 36   No results for input(s): AMMONIA in the last 168 hours. Coagulation profile No results for input(s): INR, PROTIME in the last 168 hours.  CBC: Recent Labs  Lab 05/11/20 1411 05/11/20 2355  WBC 5.1 5.8  HGB 12.6* 12.3*  HCT 36.1* 34.1*  MCV 91.6 90.7  PLT 101* 100*   Cardiac Enzymes: No results for input(s): CKTOTAL, CKMB, CKMBINDEX, TROPONINI in the last 168 hours. BNP (last 3 results) No results for input(s): PROBNP in the last 8760 hours. CBG: Recent Labs  Lab 05/11/20 2058 05/12/20 0750  GLUCAP 244* 75   D-Dimer: No results for input(s): DDIMER in the last 72 hours. Hgb A1c: No results for input(s): HGBA1C in the last 72 hours. Lipid Profile: No results for input(s): CHOL, HDL, LDLCALC, TRIG, CHOLHDL, LDLDIRECT in the last 72 hours. Thyroid function studies: No results for input(s): TSH, T4TOTAL, T3FREE, THYROIDAB in the last 72 hours.  Invalid input(s): FREET3 Anemia work up: No results for input(s): VITAMINB12, FOLATE, FERRITIN, TIBC, IRON, RETICCTPCT in the last 72  hours. Sepsis Labs: Recent Labs  Lab 05/11/20 1411 05/11/20 2355  WBC 5.1 5.8    Microbiology Recent Results (from the past 240 hour(s))  Resp Panel by RT-PCR (Flu A&B, Covid) Nasopharyngeal Swab     Status: None   Collection Time: 05/11/20  5:30 PM   Specimen: Nasopharyngeal Swab; Nasopharyngeal(NP) swabs in vial transport medium  Result Value Ref Range Status   SARS Coronavirus 2 by RT PCR NEGATIVE NEGATIVE Final    Comment: (NOTE) SARS-CoV-2 target nucleic acids are NOT  DETECTED.  The SARS-CoV-2 RNA is generally detectable in upper respiratory specimens during the acute phase of infection. The lowest concentration of SARS-CoV-2 viral copies this assay can detect is 138 copies/mL. A negative result does not preclude SARS-Cov-2 infection and should not be used as the sole basis for treatment or other patient management decisions. A negative result may occur with  improper specimen collection/handling, submission of specimen other than nasopharyngeal swab, presence of viral mutation(s) within the areas targeted by this assay, and inadequate number of viral copies(<138 copies/mL). A negative result must be combined with clinical observations, patient history, and epidemiological information. The expected result is Negative.  Fact Sheet for Patients:  EntrepreneurPulse.com.au  Fact Sheet for Healthcare Providers:  IncredibleEmployment.be  This test is no t yet approved or cleared by the [place] FDA and  has been authorized for detection and/or diagnosis of SARS-CoV-2 by FDA under an Emergency Use Authorization (EUA). This EUA will remain  in effect (meaning this test can be used) for the duration of the COVID-19 declaration under Section 564(b)(1) of the Act, 21 U.S.C.section 360bbb-3(b)(1), unless the authorization is terminated  or revoked sooner.       Influenza A by PCR NEGATIVE NEGATIVE Final   Influenza B by PCR NEGATIVE  NEGATIVE Final    Comment: (NOTE) The Xpert Xpress SARS-CoV-2/FLU/RSV plus assay is intended as an aid in the diagnosis of influenza from Nasopharyngeal swab specimens and should not be used as a sole basis for treatment. Nasal washings and aspirates are unacceptable for Xpert Xpress SARS-CoV-2/FLU/RSV testing.  Fact Sheet for Patients: EntrepreneurPulse.com.au  Fact Sheet for Healthcare Providers: IncredibleEmployment.be  This test is not yet approved or cleared by the [place] FDA and has been authorized for detection and/or diagnosis of SARS-CoV-2 by FDA under an Emergency Use Authorization (EUA). This EUA will remain in effect (meaning this test can be used) for the duration of the COVID-19 declaration under Section 564(b)(1) of the Act, 21 U.S.C. section 360bbb-3(b)(1), unless the authorization is terminated or revoked.  Performed at [facility], [address] [place], [place] [address]     Procedures and diagnostic studies:  CT ABDOMEN PELVIS W CONTRAST  Result Date: 05/11/2020 CLINICAL DATA:  History of prior cholecystectomy and cholelithiasis with recent ERCP with new onset diarrhea and tarry stools EXAM: CT ABDOMEN AND PELVIS WITH CONTRAST TECHNIQUE: Multidetector CT imaging of the abdomen and pelvis was performed using the standard protocol following bolus administration of intravenous contrast. CONTRAST:  1101mL OMNIPAQUE IOHEXOL 300 MG/ML  SOLN COMPARISON:  04/08/2020 FINDINGS: Lower chest: No acute abnormality. Hepatobiliary: Mild fatty infiltration of the liver is noted. The gallbladder has been surgically removed. Pancreas: Unremarkable. No pancreatic ductal dilatation or surrounding inflammatory changes. Spleen: Normal in size without focal abnormality. Adrenals/Urinary Tract: Adrenal glands are within normal limits. Kidneys demonstrate a normal enhancement pattern bilaterally. Renal cysts are noted on the left stable  from the prior exam. No calculi or obstructive changes are seen. Ureters are within normal limits. The bladder is partially distended. Stomach/Bowel: The appendix is within normal limits. No obstructive or inflammatory changes of the colon are seen. The stomach and small bowel are within normal limits. Vascular/Lymphatic: Aortic atherosclerosis. No enlarged abdominal or pelvic lymph nodes. Reproductive: Prostate is prominent indenting upon the inferior aspect of the bladder. Other: No abdominal wall hernia or abnormality. No abdominopelvic ascites. Musculoskeletal: Right hip replacement is seen. Degenerative changes of lumbar spine are noted. IMPRESSION: Changes of prior cholecystectomy are noted. No  acute abnormality is seen. Stable renal cystic change on the left and prostatic enlargement. Electronically Signed   By: Inez Catalina M.D.   On: 05/11/2020 18:23   DG C-Arm 1-60 Min-No Report  Result Date: 05/12/2020 Fluoroscopy was utilized by the requesting physician.  No radiographic interpretation.               LOS: 1 day   Elizah Lydon  [facility]   Pager on [email or web address]. If 7PM-7AM, please contact night-coverage at [email or web address]     05/12/2020, 11:46 AM

## 2020-05-12 NOTE — Op Note (Addendum)
[facility] Gastroenterology Patient Name: [name] [name] Procedure Date: 05/12/2020 10:34 AM MRN: [id number] Account #: [id number] Date of Birth: 03-Apr-1933 Admit Type: Inpatient Age: 87 Room: [facility] ENDO ROOM 4 Gender: Male Note Status: Finalized Procedure:             ERCP Indications:           Jaundice, Elevated liver enzymes Providers:             Lucilla Lame MD, MD Medicines:             Propofol per Anesthesia Complications:         No immediate complications. Procedure:             Pre-Anesthesia Assessment:                        - Prior to the procedure, a History and Physical was                         performed, and patient medications and allergies were                         reviewed. The patient's tolerance of previous                         anesthesia was also reviewed. The risks and benefits                         of the procedure and the sedation options and risks                         were discussed with the patient. All questions were                         answered, and informed consent was obtained. Prior                         Anticoagulants: The patient has taken no previous                         anticoagulant or antiplatelet agents. ASA Grade                         Assessment: II - A patient with mild systemic disease.                         After reviewing the risks and benefits, the patient                         was deemed in satisfactory condition to undergo the                         procedure.                        After obtaining informed consent, the scope was passed                         under direct vision. Throughout the procedure, the  patient's blood pressure, pulse, and oxygen                         saturations were monitored continuously.After                         obtaining informed consent, the scope was passed under                         direct vision. Throughout the procedure,  the patient's                         blood pressure, pulse, and oxygen saturations were                         monitored continuously.The ERCP was accomplished                         without difficulty. The patient tolerated the                         procedure well. The [employer]                         single use duodenoscope was introduced through the                         mouth, and used to inject contrast into and used to                         inject contrast into the bile duct. Findings:      A standard esophagogastroduodenoscopy scope was used for the examination       of the upper gastrointestinal tract. The scope was passed under direct       vision through the upper GI tract. A small hiatal hernia was present.       The entire examined stomach was normal. The examined duodenum was       normal. A biliary sphincterotomy had been performed. The sphincterotomy       appeared open. The bile duct was deeply cannulated with the 15 mm       balloon. Contrast was injected. I personally interpreted the bile duct       images. There was brisk flow of contrast through the ducts. Image       quality was excellent. Contrast extended to the hepatic ducts. The main       bile duct was normal. A wire was passed into the biliary tree. The       biliary tree was swept with a 15 mm balloon starting at the bifurcation.       Nothing was found. Impression:            - Small hiatal hernia.                        - Normal stomach.                        - Normal examined duodenum.                        -  Prior biliary sphincterotomy appeared open.                        - The biliary tree was swept and nothing was found. Recommendation:        - Return patient to hospital ward for ongoing care.                        - Resume regular diet.                        - Consider other causes of increast LFT's Procedure Code(s):     --- Professional ---                         [phone], Endoscopic retrograde cholangiopancreatography                         (ERCP); diagnostic, including collection of                         specimen(s) by brushing or washing, when performed                         (separate procedure)                        [id number], Endoscopic catheterization of the biliary                         ductal system, radiological supervision and                         interpretation Diagnosis Code(s):     --- Professional ---                        R74.8, Abnormal levels of other serum enzymes                        R17, Unspecified jaundice CPT copyright 2019 American Medical Association. All rights reserved. The codes documented in this report are preliminary and upon coder review may  be revised to meet current compliance requirements. Lucilla Lame MD, MD 05/12/2020 11:23:17 AM This report has been signed electronically. Number of Addenda: 0 Note Initiated On: 05/12/2020 10:34 AM Estimated Blood Loss:  Estimated blood loss: none.      [facility]

## 2020-05-12 NOTE — Transfer of Care (Signed)
Immediate Anesthesia Transfer of Care Note  Patient: [name] [name]  Procedure(s) Performed: ENDOSCOPIC RETROGRADE CHOLANGIOPANCREATOGRAPHY (ERCP) (N/A ) ESOPHAGOGASTRODUODENOSCOPY (EGD) (N/A )  Patient Location: Endoscopy Unit  Anesthesia Type:General  Level of Consciousness: drowsy  Airway & Oxygen Therapy: Patient Spontanous Breathing and Patient connected to nasal cannula oxygen  Post-op Assessment: Report given to RN and Post -op Vital signs reviewed and stable  Post vital signs: Reviewed  Last Vitals:  Vitals Value Taken Time  BP 133/62 05/12/20 1122  Temp    Pulse 68 05/12/20 1123  Resp 14 05/12/20 1123  SpO2 99 % 05/12/20 1123  Vitals shown include unvalidated device data.  Last Pain:  Vitals:   05/12/20 1039  TempSrc: Temporal  PainSc: 0-No pain         Complications: No complications documented.

## 2020-05-12 NOTE — Consult Note (Signed)
Cephas Darby, MD [address]  [place]  [place], [place] [address]  Main: [phone]  Fax: [phone] Pager: [phone]   Consultation  Referring Provider:     No ref. provider found Primary Care Physician:  [name], MD Primary Gastroenterologist:  Dr. Bonna Gains         Reason for Consultation:     Melena, elevated bilirubin  Date of Admission:  05/11/2020 Date of Consultation:  05/12/2020         HPI:   [name] NABERS is a 87 y.o. male history of coronary artery disease, diabetes, history of symptomatic choledocholithiasis status post ERCP with biliary sphincterotomy and stone extraction on 3/1 by Dr. Allen Norris.  Patient presented with 3 days of intermittent black stools as well as 1 episode of rectal bleeding.  He also experienced upper abdominal pain as well as yellowing of eyes.  Patient was hemodynamically stable in the ER, no evidence of leukocytosis, mild anemia, hemoglobin in fact was better than prior to ERCP.  LFTs worsened compared to before ERCP, alkaline phosphatase 524, AST 135, ALT 112, total bilirubin 9, lipase was normal.  Normal BUN/creatinine.  Platelets 100.  Bilirubin was 1.3 on 04/27/2020  Patient is started on IV antibiotics, underwent CT abdomen and pelvis with contrast, there was no apparent biliary dilation, no evidence of biliary stricture.  He remained hemodynamically stable.  Has been afebrile, LFTs remain same compared to admission.  Patient took 8 tablets of Tylenol for last 3 days   NSAIDs: None  Antiplts/Anticoagulants/Anti thrombotics: None  GI Procedures:  ERCP 05/05/2020 - Choledocholithiasis was found. Complete removal was accomplished by biliary sphincterotomy and balloon extraction. - A biliary sphincterotomy was performed. - The biliary tree was swept.  Past Medical History:  Diagnosis Date  . BPH (benign prostatic hyperplasia)   . Cancer ([place])    skin  . Cataract   . Coronary artery disease    a. 4v CABG  01/2005; b. cath 06/05/13 showed patent grafts: LIMA-LAD, VG-D, VG-OM3, VG-dRCA, nl filling pressures, nl LV fxn  . Diastolic dysfunction    a. echo 08/5463: nl systolic fxn, mild LVH, diastolic relaxation abnormality, mildly enlarged LA, mild Ao insufficiency  . Dyspnea    with exertion  . Dysrhythmia   . ED (erectile dysfunction)   . GERD (gastroesophageal reflux disease)   . Hyperlipidemia   . Hypertension   . IDDM (insulin dependent diabetes mellitus) 2000  . Osteoarthritis   . Perirectal fistula   . Pneumonia 12/2016  . Shingles 09/04/2018  . Tubular adenoma of colon 07/2012  . Vertigo     Past Surgical History:  Procedure Laterality Date  . BACK SURGERY    . CARDIAC CATHETERIZATION  06/05/2013   [facility].   Marland Kitchen CARDIAC CATHETERIZATION N/A 09/24/2015   Procedure: Right Heart Cath and Coronary/Graft Angiography;  Surgeon: Minna Merritts, MD;  Location: [facility];  Service: Cardiovascular;  Laterality: N/A;  . CATARACT EXTRACTION  sept 2013   right  . CATARACT EXTRACTION W/PHACO Left 03/28/2017   Procedure: CATARACT EXTRACTION PHACO AND INTRAOCULAR LENS PLACEMENT (IOC);  Surgeon: Birder Robson, MD;  Location: [facility];  Service: Ophthalmology;  Laterality: Left;  [place] 00:42.0 AP% 15.0 CDE 6.31 Fluid Pack lot # [id number]  . CHOLECYSTECTOMY  05/15/2011   Procedure: LAPAROSCOPIC CHOLECYSTECTOMY;  Surgeon: Rolm Bookbinder, MD;  Location: [facility];  Service: General;  Laterality: N/A;  . COLONOSCOPY W/ POLYPECTOMY    . CORONARY  ARTERY BYPASS GRAFT  2007   x 4  . ERCP N/A 05/05/2020   Procedure: ENDOSCOPIC RETROGRADE CHOLANGIOPANCREATOGRAPHY (ERCP);  Surgeon: Lucilla Lame, MD;  Location: [facility];  Service: Endoscopy;  Laterality: N/A;  . EYE SURGERY    . FOOT SURGERY  2012   right foot  . JOINT REPLACEMENT  8/10   Right THR--[name]  . LEFT AND RIGHT HEART CATHETERIZATION WITH CORONARY/GRAFT ANGIOGRAM N/A 06/05/2013   Procedure: LEFT AND RIGHT HEART CATHETERIZATION  WITH Beatrix Fetters;  Surgeon: Peter M Martinique, MD;  Location: [facility] LAB;  Service: Cardiovascular;  Laterality: N/A;  . LUMBAR LAMINECTOMY  1989  . Prostate photovaporization  5/16   Dr Budd Palmer  . RIGHT/LEFT HEART CATH AND CORONARY/GRAFT ANGIOGRAPHY N/A 01/05/2018   Procedure: RIGHT/LEFT HEART CATH AND CORONARY/GRAFT ANGIOGRAPHY;  Surgeon: Minna Merritts, MD;  Location: [facility];  Service: Cardiovascular;  Laterality: N/A;    Prior to Admission medications   Medication Sig Start Date End Date Taking? Authorizing Provider  aspirin EC 81 MG tablet Take 81 mg by mouth every other day.   Yes [provider]  carvedilol (COREG) 12.5 MG tablet TAKE ONE (1) TABLET BY MOUTH TWO TIMES PER DAY 04/01/20  Yes Gollan, Kathlene November, MD  ezetimibe (ZETIA) 10 MG tablet Take 1 tablet (10 mg total) by mouth daily. 04/10/20 07/09/20 Yes Nolberto Hanlon, MD  FARXIGA 10 MG TABS tablet Take 10 mg by mouth every morning. 04/28/20  Yes [provider]  finasteride (PROSCAR) 5 MG tablet Take 1 tablet (5 mg total) by mouth daily. 03/11/20  Yes [name], MD  gabapentin (NEURONTIN) 100 MG capsule Take 100 mg by mouth 2 (two) times daily.   Yes [provider]  omeprazole (PRILOSEC) 20 MG capsule TAKE 1 CAPSULE BY MOUTH TWICE DAILY 04/24/20  Yes [name], MD  ondansetron (ZOFRAN ODT) 4 MG disintegrating tablet Take 1 tablet (4 mg total) by mouth every 8 (eight) hours as needed for nausea or vomiting. 04/06/20  Yes Crecencio Mc, MD  TOUJEO SOLOSTAR 300 UNIT/ML Solostar Pen INJECT 50 UNITS DAILY MAY INCREASE 2U EVERY 3 DAYS UNTIL FBG IS AT GOAL UP TO 80 UNITS DAILY 05/22/19  Yes [name], MD  Vitamin D, Cholecalciferol, 1000 units TABS Take 1,000 Units by mouth daily.    Yes [provider]  zinc gluconate 50 MG tablet Take by mouth.   Yes [provider]  zolpidem (AMBIEN) 5 MG tablet Take 1 tablet (5 mg total) by mouth at bedtime as  needed for sleep. Stop trazadone not helpful 02/26/19  Yes [name], MD  amLODipine (NORVASC) 5 MG tablet Take 1 tablet (5 mg total) by mouth daily. Patient not taking: No sig reported 10/21/19   Minna Merritts, MD  FLUZONE HIGH-DOSE QUADRIVALENT 0.7 ML SUSY  01/01/20   [provider]   Current Facility-Administered Medications:  .  [MAR Hold] acetaminophen (TYLENOL) tablet 650 mg, 650 mg, Oral, Q6H PRN **OR** [MAR Hold] acetaminophen (TYLENOL) suppository 650 mg, 650 mg, Rectal, Q6H PRN, Para Skeans, MD .  Doug Sou Hold] carvedilol (COREG) tablet 12.5 mg, 12.5 mg, Oral, BID WC, Para Skeans, MD .  Doug Sou Hold] ezetimibe (ZETIA) tablet 10 mg, 10 mg, Oral, Daily, Para Skeans, MD .  Doug Sou Hold] finasteride (PROSCAR) tablet 5 mg, 5 mg, Oral, Daily, Para Skeans, MD .  Doug Sou Hold] gabapentin (NEURONTIN) capsule 100 mg, 100 mg, Oral, BID, Posey Pronto, Ekta  V, MD, 100 mg at 05/11/20 2111 .  [MAR Hold] hydrALAZINE (APRESOLINE) injection 5 mg, 5 mg, Intravenous, Q4H PRN, Para Skeans, MD .  Doug Sou Hold] hydrOXYzine (ATARAX) 10 MG/5ML syrup 10 mg, 10 mg, Oral, TID PRN, Para Skeans, MD .  Doug Sou Hold] insulin aspart (novoLOG) injection 0-15 Units, 0-15 Units, Subcutaneous, TID WC, Para Skeans, MD .  Doug Sou Hold] insulin aspart (novoLOG) injection 0-5 Units, 0-5 Units, Subcutaneous, QHS, Para Skeans, MD, 3 Units at 05/11/20 2110 .  [MAR Hold] ondansetron (ZOFRAN) tablet 4 mg, 4 mg, Oral, Q6H PRN **OR** [MAR Hold] ondansetron (ZOFRAN) injection 4 mg, 4 mg, Intravenous, Q6H PRN, Para Skeans, MD   Family History  Problem Relation Age of Onset  . Lung cancer Mother   . Heart disease Father   . Colon cancer Neg Hx   . Breast cancer Neg Hx      Social History   Tobacco Use  . Smoking status: Never Smoker  . Smokeless tobacco: Never Used  Vaping Use  . Vaping Use: Never used  Substance Use Topics  . Alcohol use: Yes    Comment: occ cocktail  . Drug use: No    Allergies as of  05/11/2020 - Review Complete 05/11/2020  Allergen Reaction Noted  . Cortisone Other (See Comments) 06/24/2016  . Metformin Diarrhea 09/05/2016  . Metformin and related Diarrhea 09/05/2016    Review of Systems:    All systems reviewed and negative except where noted in HPI.   Physical Exam:  Vital signs in last 24 hours: Temp:  [97.4 F (36.3 C)-98.7 F (37.1 C)] 97.4 F (36.3 C) (03/08 1122) Pulse Rate:  [47-69] 66 (03/08 1132) Resp:  [10-17] 12 (03/08 1132) BP: (127-195)/(37-79) 156/73 (03/08 1132) SpO2:  [94 %-100 %] 99 % (03/08 1132) Weight:  [94.3 kg-94.8 kg] 94.8 kg (03/07 2100) Last BM Date: 05/11/20 General:   Pleasant, cooperative in NAD Head:  Normocephalic and atraumatic. Eyes: Positive icterus.   Conjunctiva pink. PERRLA. Ears:  Normal auditory acuity. Neck:  Supple; no masses or thyroidomegaly Lungs: Respirations even and unlabored. Lungs clear to auscultation bilaterally.   No wheezes, crackles, or rhonchi.  Heart:  Regular rate and rhythm;  Without murmur, clicks, rubs or gallops Abdomen:  Soft, nondistended, mild epigastric tenderness. Normal bowel sounds. No appreciable masses or hepatomegaly.  No rebound or guarding.  Rectal:  Not performed. Msk:  Symmetrical without gross deformities.  Strength generalized weakness Extremities:  Without edema, cyanosis or clubbing. Neurologic:  Alert and oriented x3;  grossly normal neurologically. Skin:  Intact without significant lesions or rashes. Psych:  Alert and cooperative. Normal affect.  LAB RESULTS: CBC Latest Ref Rng & Units 05/11/2020 05/11/2020 04/15/2020  WBC 4.0 - 10.5 K/uL 5.8 5.1 6.5  Hemoglobin 13.0 - 17.0 g/dL 12.3(L) 12.6(L) 11.7(L)  Hematocrit 39.0 - 52.0 % 34.1(L) 36.1(L) 34.1(L)  Platelets 150 - 400 K/uL 100(L) 101(L) 163    BMET BMP Latest Ref Rng & Units 05/12/2020 05/11/2020 04/27/2020  Glucose 70 - 99 mg/dL 78 223(H) 267(H)  BUN 8 - 23 mg/dL _0 Creatinine 0.61 - 1.24 mg/dL 0.97 1.18 1.41(H)   BUN/Creat Ratio 10 - 24 - - 11  Sodium 135 - 145 mmol/L 139 135 140  Potassium 3.5 - 5.1 mmol/L 3.5 3.6 4.3  Chloride 98 - 111 mmol/L 111 109 103  CO2 22 - 32 mmol/L 21(L) 19(L) 19(L)  Calcium 8.9 - 10.3 mg/dL 8.5(L) 8.4(L) 8.8  LFT Hepatic Function Latest Ref Rng & Units 05/12/2020 05/11/2020 04/27/2020  Total Protein 6.5 - 8.1 g/dL 5.6(L) 6.1(L) 6.1  Albumin 3.5 - 5.0 g/dL 2.7(L) 3.0(L) 3.5(L)  AST 15 - 41 U/L 109(H) 135(H) 23  ALT 0 - 44 U/L 98(H) 112(H) 25  Alk Phosphatase 38 - 126 U/L 524(H) 555(H) 285(H)  Total Bilirubin 0.3 - 1.2 mg/dL 9.0(H) 9.0(H) 1.3(H)  Bilirubin, Direct 0.0 - 0.2 mg/dL - - -     STUDIES: CT ABDOMEN PELVIS W CONTRAST  Result Date: 05/11/2020 CLINICAL DATA:  History of prior cholecystectomy and cholelithiasis with recent ERCP with new onset diarrhea and tarry stools EXAM: CT ABDOMEN AND PELVIS WITH CONTRAST TECHNIQUE: Multidetector CT imaging of the abdomen and pelvis was performed using the standard protocol following bolus administration of intravenous contrast. CONTRAST:  141m OMNIPAQUE IOHEXOL 300 MG/ML  SOLN COMPARISON:  04/08/2020 FINDINGS: Lower chest: No acute abnormality. Hepatobiliary: Mild fatty infiltration of the liver is noted. The gallbladder has been surgically removed. Pancreas: Unremarkable. No pancreatic ductal dilatation or surrounding inflammatory changes. Spleen: Normal in size without focal abnormality. Adrenals/Urinary Tract: Adrenal glands are within normal limits. Kidneys demonstrate a normal enhancement pattern bilaterally. Renal cysts are noted on the left stable from the prior exam. No calculi or obstructive changes are seen. Ureters are within normal limits. The bladder is partially distended. Stomach/Bowel: The appendix is within normal limits. No obstructive or inflammatory changes of the colon are seen. The stomach and small bowel are within normal limits. Vascular/Lymphatic: Aortic atherosclerosis. No enlarged abdominal or pelvic  lymph nodes. Reproductive: Prostate is prominent indenting upon the inferior aspect of the bladder. Other: No abdominal wall hernia or abnormality. No abdominopelvic ascites. Musculoskeletal: Right hip replacement is seen. Degenerative changes of lumbar spine are noted. IMPRESSION: Changes of prior cholecystectomy are noted. No acute abnormality is seen. Stable renal cystic change on the left and prostatic enlargement. Electronically Signed   By: MInez CatalinaM.D.   On: 05/11/2020 18:23   DG C-Arm 1-60 Min-No Report  Result Date: 05/12/2020 Fluoroscopy was utilized by the requesting physician.  No radiographic interpretation.      Impression / Plan:   WDREXLER MALANDis a 87y.o. male with metabolic syndrome, coronary artery disease, s/p cholecystectomy, intermittently elevated LFTs with obstructive pattern, found to have choledocholithiasis on MRCP, underwent ERCP on 3/1 with biliary sphincterotomy and balloon extraction.  Patient presented post ERCP with mild upper abdominal discomfort associated with fever and black tarry stools.  CT abdomen pelvis did not reveal obvious biliary dilation.  No evidence of post ERCP pancreatitis, serum lipase was normal.  Patient denies excess Tylenol use, denies new medications or herbal supplements.  Elevated LFTs Patient had secondary liver disease work-up as outpatient which was negative No evidence of cirrhosis Recommend antibiotics due to new onset of fever Monitor LFTs closely N.p.o. Plan for ERCP tomorrow, discussed with Dr. WAllen Norris Melena Continue pantoprazole drip We will perform EGD during ERCP tomorrow  Thank you for involving me in the care of this patient.      LOS: 1 day   RSherri Sear MD  05/12/2020, 11:39 AM   Note: This dictation was prepared with Dragon dictation along with smaller phrase technology. Any transcriptional errors that result from this process are unintentional.

## 2020-05-12 NOTE — Anesthesia Preprocedure Evaluation (Signed)
Anesthesia Evaluation  Patient identified by MRN, date of birth, ID band Patient awake    Reviewed: Allergy & Precautions, H&P , NPO status , Patient's Chart, lab work & pertinent test results  History of Anesthesia Complications Negative for: history of anesthetic complications  Airway Mallampati: III  TM Distance: <3 FB Neck ROM: full    Dental  (+) Chipped, Poor Dentition, Missing   Pulmonary neg shortness of breath, sleep apnea ,           Cardiovascular Exercise Tolerance: Good hypertension, (-) angina+ CAD, + CABG and +CHF       Neuro/Psych  Neuromuscular disease negative psych ROS   GI/Hepatic GERD  Medicated and Controlled,  Endo/Other  diabetes, Type 2  Renal/GU Renal disease  negative genitourinary   Musculoskeletal  (+) Arthritis ,   Abdominal   Peds  Hematology negative hematology ROS (+)   Anesthesia Other Findings   Reproductive/Obstetrics negative OB ROS                             Anesthesia Physical  Anesthesia Plan  ASA: III  Anesthesia Plan: General   Post-op Pain Management:    Induction: Intravenous  PONV Risk Score and Plan: Propofol infusion and TIVA  Airway Management Planned: Natural Airway and Nasal Cannula  Additional Equipment:   Intra-op Plan:   Post-operative Plan:   Informed Consent: I have reviewed the patients History and Physical, chart, labs and discussed the procedure including the risks, benefits and alternatives for the proposed anesthesia with the patient or authorized representative who has indicated his/her understanding and acceptance.       Plan Discussed with:   Anesthesia Plan Comments:         Anesthesia Quick Evaluation

## 2020-05-13 ENCOUNTER — Encounter: Payer: Self-pay | Admitting: Gastroenterology

## 2020-05-13 DIAGNOSIS — K7589 Other specified inflammatory liver diseases: Secondary | ICD-10-CM | POA: Diagnosis not present

## 2020-05-13 DIAGNOSIS — K921 Melena: Secondary | ICD-10-CM | POA: Diagnosis not present

## 2020-05-13 LAB — PROTIME-INR
INR: 0.9 (ref 0.8–1.2)
Prothrombin Time: 11.9 seconds (ref 11.4–15.2)

## 2020-05-13 LAB — CBC WITH DIFFERENTIAL/PLATELET
Abs Immature Granulocytes: 0.05 10*3/uL (ref 0.00–0.07)
Basophils Absolute: 0 10*3/uL (ref 0.0–0.1)
Basophils Relative: 1 %
Eosinophils Absolute: 0.1 10*3/uL (ref 0.0–0.5)
Eosinophils Relative: 1 %
HCT: 36.2 % — ABNORMAL LOW (ref 39.0–52.0)
Hemoglobin: 12.3 g/dL — ABNORMAL LOW (ref 13.0–17.0)
Immature Granulocytes: 1 %
Lymphocytes Relative: 11 %
Lymphs Abs: 0.9 10*3/uL (ref 0.7–4.0)
MCH: 31.8 pg (ref 26.0–34.0)
MCHC: 34 g/dL (ref 30.0–36.0)
MCV: 93.5 fL (ref 80.0–100.0)
Monocytes Absolute: 0.5 10*3/uL (ref 0.1–1.0)
Monocytes Relative: 7 %
Neutro Abs: 6.1 10*3/uL (ref 1.7–7.7)
Neutrophils Relative %: 79 %
Platelets: 100 10*3/uL — ABNORMAL LOW (ref 150–400)
RBC: 3.87 MIL/uL — ABNORMAL LOW (ref 4.22–5.81)
RDW: 14 % (ref 11.5–15.5)
WBC: 7.6 10*3/uL (ref 4.0–10.5)
nRBC: 0 % (ref 0.0–0.2)

## 2020-05-13 LAB — RETICULOCYTES
Immature Retic Fract: 14.5 % (ref 2.3–15.9)
RBC.: 3.84 MIL/uL — ABNORMAL LOW (ref 4.22–5.81)
Retic Count, Absolute: 111 10*3/uL (ref 19.0–186.0)
Retic Ct Pct: 2.9 % (ref 0.4–3.1)

## 2020-05-13 LAB — BASIC METABOLIC PANEL
Anion gap: 6 (ref 5–15)
BUN: 15 mg/dL (ref 8–23)
CO2: 23 mmol/L (ref 22–32)
Calcium: 8.5 mg/dL — ABNORMAL LOW (ref 8.9–10.3)
Chloride: 109 mmol/L (ref 98–111)
Creatinine, Ser: 0.99 mg/dL (ref 0.61–1.24)
GFR, Estimated: >60 mL/min (ref >60)
Glucose, Bld: 184 mg/dL — ABNORMAL HIGH (ref 70–99)
Potassium: 3.8 mmol/L (ref 3.5–5.1)
Sodium: 138 mmol/L (ref 135–145)

## 2020-05-13 LAB — GLUCOSE, CAPILLARY
Glucose-Capillary: 187 mg/dL — ABNORMAL HIGH (ref 70–99)
Glucose-Capillary: 251 mg/dL — ABNORMAL HIGH (ref 70–99)
Glucose-Capillary: 158 mg/dL — ABNORMAL HIGH (ref 70–99)
Glucose-Capillary: 186 mg/dL — ABNORMAL HIGH (ref 70–99)

## 2020-05-13 LAB — HEPATIC FUNCTION PANEL
ALT: 81 U/L — ABNORMAL HIGH (ref 0–44)
AST: 72 U/L — ABNORMAL HIGH (ref 15–41)
Albumin: 2.7 g/dL — ABNORMAL LOW (ref 3.5–5.0)
Alkaline Phosphatase: 521 U/L — ABNORMAL HIGH (ref 38–126)
Bilirubin, Direct: 5.6 mg/dL — ABNORMAL HIGH (ref 0.0–0.2)
Indirect Bilirubin: 3.8 mg/dL — ABNORMAL HIGH (ref 0.3–0.9)
Total Bilirubin: 9.4 mg/dL — ABNORMAL HIGH (ref 0.3–1.2)
Total Protein: 5.6 g/dL — ABNORMAL LOW (ref 6.5–8.1)

## 2020-05-13 LAB — LIPASE, BLOOD: Lipase: 25 U/L (ref 11–51)

## 2020-05-13 MED ORDER — POLYETHYLENE GLYCOL 3350 17 G PO PACK
17.0000 g | PACK | Freq: Every day | ORAL | Status: DC
  Administered 2020-05-13 – 2020-05-15 (×3): 17 g via ORAL
  Filled 2020-05-13 (×3): qty 1

## 2020-05-13 MED ORDER — PANTOPRAZOLE SODIUM 40 MG PO TBEC: 40.0000 mg | DELAYED_RELEASE_TABLET | Freq: Every day | ORAL | Status: DC

## 2020-05-13 MED ORDER — CHOLESTYRAMINE 4 G PO PACK
4.0000 g | PACK | Freq: Two times a day (BID) | ORAL | Status: DC
  Administered 2020-05-13 – 2020-05-15 (×5): 4 g via ORAL
  Filled 2020-05-13 (×6): qty 1

## 2020-05-13 MED ORDER — BISACODYL 5 MG PO TBEC
10.0000 mg | DELAYED_RELEASE_TABLET | Freq: Every day | ORAL | Status: DC | PRN
  Administered 2020-05-15: 10 mg via ORAL
  Filled 2020-05-13: qty 2

## 2020-05-13 MED ORDER — MELATONIN 3 MG PO TABS
3.0000 mg | ORAL_TABLET | Freq: Every day | ORAL | Status: DC
  Filled 2020-05-13 (×2): qty 1

## 2020-05-13 MED ORDER — BISACODYL 10 MG RE SUPP: 10.0000 mg | Freq: Every day | RECTAL | Status: DC | PRN

## 2020-05-13 MED ORDER — PANTOPRAZOLE SODIUM 40 MG PO TBEC
40.0000 mg | DELAYED_RELEASE_TABLET | Freq: Every day | ORAL | Status: DC
  Administered 2020-05-13 – 2020-05-15 (×3): 40 mg via ORAL
  Filled 2020-05-13 (×4): qty 1

## 2020-05-13 NOTE — Progress Notes (Signed)
[facility] Progress Note  Patient: [name] [name]    KDX:[id number]  DOA: 05/11/2020     Date of Service: the patient was seen and examined on 05/13/2020  Chief Complaint  Patient presents with  . Rectal Bleeding   Brief hospital course:  [name] [name] is a 87 y.o. male with medical history significant for BPH, hypertension, CAD, chronic diastolic CHF, type II DM, CKD stage IIIa, choledocholithiasis s/p ERCP with biliary sphincterectomy and balloon extraction on 05/05/2020. He presented to the hospital because of 3-day history of melena.  He was admitted to the hospital for acute GI bleeding and elevated liver enzymes with jaundice.   Assessment and Plan: Principal Problem:   Melena Active Problems:   Essential hypertension   BPH (benign prostatic hyperplasia)   GERD (gastroesophageal reflux disease)   CAD (coronary artery disease)   Chronic diastolic CHF (congestive heart failure) (HCC)   Type 2 diabetes mellitus with complication, without long-term current use of insulin (HCC)   CKD (chronic kidney disease) stage 3, GFR 30-59 ml/min (HCC)   Jaundice    # Acute GI bleeding/melena: s/p ERCP. s/p IV Protonix.  3/9 Started pantoprazole 40 mg p.o. daily Monitor H&H. Follow-up with gastroenterologist for further recommendations.  # Elevated liver enzymes/jaundice: s/p ERCP 3/8 and findings are as follows: small hiatal hernia, normal stomach, normal duodenum prior biliary sphincterotomy appeared open, the biliary tree was swept and nothing was found.  s/p ERCP with biliary sphincterectomy and balloon extraction of choledocholithiasis on 05/05/2020.  Monitor liver enzymes and follow-up with GI. Follow lipase level, reticulocyte count and haptoglobin to rule out other causes of jaundice   # Chronic diastolic CHF: Compensated  # CAD: Aspirin on hold  Other comorbidities include CKD stage III, type II DM, CAD, BPH, hypertension   Body mass index is 27.56 kg/m.        Diet: Regular  DVT Prophylaxis: SCD, pharmacological prophylaxis contraindicated due to Risk of bleeding   Advance goals of care discussion: Full code  Family Communication: family was present at bedside, at the time of interview.  The pt provided permission to discuss medical plan with the family. Opportunity was given to ask question and all questions were answered satisfactorily.   Disposition:  Pt is from Home, admitted with Jaundice, still has changes, which precludes a safe discharge. Discharge to home, when once cleared by GI.  Subjective: No overnight issues, mild abdominal tenderness left lower quadrant 5/10, no BM for 2 days, passing gas.  Denies any nausea or vomiting.  Denied any chest pain or shortness of breath.     Physical Exam: General:  alert oriented to time, place, and person.  Appear in no distress, affect appropriate Eyes: PERRLA ENT: Oral Mucosa Clear, moist  Neck: no JVD,  Cardiovascular: S1 and S2 Present, no Murmur,  Respiratory: good respiratory effort, Bilateral Air entry equal and Decreased, no Crackles, no wheezes Abdomen: Bowel Sound present, Soft and no tenderness,  Skin: no rashes Extremities: no Pedal edema, no calf tenderness Neurologic: without any new focal findings Gait not checked due to patient safety concerns  Vitals:   05/12/20 2022 05/12/20 2338 05/13/20 0423 05/13/20 0734  BP: (!) 170/60 (!) 163/50 (!) 159/58 (!) 162/60  Pulse: (!) 58 (!) 47 (!) 52 (!) 56  Resp: 16 16 17 19   Temp:  98.4 F (36.9 C) 98.3 F (36.8 C) 98.6 F (37 C)  TempSrc:   Oral Oral  SpO2: 93% 97% 95% 98%  Weight:  Height:        Intake/Output Summary (Last 24 hours) at 05/13/2020 1036 Last data filed at 05/13/2020 0400 Gross per 24 hour  Intake 1160 ml  Output 1251 ml  Net -91 ml   Filed Weights   05/11/20 1403 05/11/20 2100  Weight: 94.3 kg 94.8 kg    Data Reviewed: I have personally reviewed and interpreted daily labs, tele strips,  imagings as discussed above. I reviewed all nursing notes, pharmacy notes, vitals, pertinent old records I have discussed plan of care as described above with RN and patient/family.  CBC: Recent Labs  Lab 05/11/20 1411 05/11/20 2355 05/13/20 0444  WBC 5.1 5.8 7.6  NEUTROABS  --   --  6.1  HGB 12.6* 12.3* 12.3*  HCT 36.1* 34.1* 36.2*  MCV 91.6 90.7 93.5  PLT 101* 100* 546*   Basic Metabolic Panel: Recent Labs  Lab 05/11/20 1411 05/12/20 0923 05/13/20 0444  NA 135 139 138  K 3.6 3.5 3.8  CL 109 111 109  CO2 19* 21* 23  GLUCOSE 223* 78 184*  BUN 18 14 15   CREATININE 1.18 0.97 0.99  CALCIUM 8.4* 8.5* 8.5*    Studies: DG C-Arm 1-60 Min-No Report  Result Date: 05/12/2020 Fluoroscopy was utilized by the requesting physician.  No radiographic interpretation.    Scheduled Meds: . carvedilol  12.5 mg Oral BID WC  . cholestyramine  4 g Oral BID  . ezetimibe  10 mg Oral Daily  . finasteride  5 mg Oral Daily  . gabapentin  100 mg Oral BID  . insulin aspart  0-15 Units Subcutaneous TID WC  . insulin aspart  0-5 Units Subcutaneous QHS  . polyethylene glycol  17 g Oral Daily   Continuous Infusions: . sodium chloride Stopped (05/12/20 2030)   PRN Meds: bisacodyl, bisacodyl, hydrALAZINE, hydrOXYzine, ondansetron **OR** ondansetron (ZOFRAN) IV, oxyCODONE  Time spent: 35 minutes  Author: Val Riles. MD [facility] 05/13/2020 10:36 AM  To reach On-call, see care teams to locate the attending and reach out to them via [email or web address]. If 7PM-7AM, please contact night-coverage If you still have difficulty reaching the attending provider, please page the [facility] (Director on Call) for [facility] on amion for assistance.

## 2020-05-13 NOTE — Progress Notes (Signed)
[name] Darby, MD [address]  [place]  [place], [place] [address]  Main: [phone]  Fax: [phone] Pager: [phone]   Subjective: Patient reports mild upper abdominal discomfort.  Has been tolerating p.o. well.  He denies any nausea, vomiting, fever, chills.  He underwent ERCP yesterday which was apparently normal without any evidence of obstruction.  No source of melena identified   Objective: Vital signs in last 24 hours: Vitals:   05/13/20 0423 05/13/20 0734 05/13/20 1123 05/13/20 1536  BP: (!) 159/58 (!) 162/60 (!) 170/62 (!) 157/51  Pulse: (!) 52 (!) 56 (!) 53 (!) 55  Resp: 17 19 18 19   Temp: 98.3 F (36.8 C) 98.6 F (37 C) 97.8 F (36.6 C) 98.4 F (36.9 C)  TempSrc: Oral Oral Oral Oral  SpO2: 95% 98% 100% 98%  Weight:      Height:       Weight change:   Intake/Output Summary (Last 24 hours) at 05/13/2020 1630 Last data filed at 05/13/2020 1538 Gross per 24 hour  Intake 720 ml  Output 1326 ml  Net -606 ml     Exam: Heart:: Bradycardia and regular rhythm, S1S2 present or without murmur or extra heart sounds Lungs: normal and clear to auscultation Abdomen: soft, nontender, normal bowel sounds   Lab Results: CBC Latest Ref Rng & Units 05/13/2020 05/11/2020 05/11/2020  WBC 4.0 - 10.5 K/uL 7.6 5.8 5.1  Hemoglobin 13.0 - 17.0 g/dL 12.3(L) 12.3(L) 12.6(L)  Hematocrit 39.0 - 52.0 % 36.2(L) 34.1(L) 36.1(L)  Platelets 150 - 400 K/uL 100(L) 100(L) 101(L)   CMP Latest Ref Rng & Units 05/13/2020 05/12/2020 05/11/2020  Glucose 70 - 99 mg/dL 184(H) 78 223(H)  BUN 8 - 23 mg/dL 15 14 18   Creatinine 0.61 - 1.24 mg/dL 0.99 0.97 1.18  Sodium 135 - 145 mmol/L 138 139 135  Potassium 3.5 - 5.1 mmol/L 3.8 3.5 3.6  Chloride 98 - 111 mmol/L 109 111 109  CO2 22 - 32 mmol/L 23 21(L) 19(L)  Calcium 8.9 - 10.3 mg/dL 8.5(L) 8.5(L) 8.4(L)  Total Protein 6.5 - 8.1 g/dL 5.6(L) 5.6(L) 6.1(L)  Total Bilirubin 0.3 - 1.2 mg/dL 9.4(H) 9.0(H) 9.0(H)  Alkaline Phos 38 - 126 U/L  521(H) 524(H) 555(H)  AST 15 - 41 U/L 72(H) 109(H) 135(H)  ALT 0 - 44 U/L 81(H) 98(H) 112(H)    Micro Results: Recent Results (from the past 240 hour(s))  Resp Panel by RT-PCR (Flu A&B, Covid) Nasopharyngeal Swab     Status: None   Collection Time: 05/11/20  5:30 PM   Specimen: Nasopharyngeal Swab; Nasopharyngeal(NP) swabs in vial transport medium  Result Value Ref Range Status   SARS Coronavirus 2 by RT PCR NEGATIVE NEGATIVE Final    Comment: (NOTE) SARS-CoV-2 target nucleic acids are NOT DETECTED.  The SARS-CoV-2 RNA is generally detectable in upper respiratory specimens during the acute phase of infection. The lowest concentration of SARS-CoV-2 viral copies this assay can detect is 138 copies/mL. A negative result does not preclude SARS-Cov-2 infection and should not be used as the sole basis for treatment or other patient management decisions. A negative result may occur with  improper specimen collection/handling, submission of specimen other than nasopharyngeal swab, presence of viral mutation(s) within the areas targeted by this assay, and inadequate number of viral copies(<138 copies/mL). A negative result must be combined with clinical observations, patient history, and epidemiological information. The expected result is Negative.  Fact Sheet for Patients:  EntrepreneurPulse.com.au  Fact Sheet for  Healthcare Providers:  IncredibleEmployment.be  This test is no t yet approved or cleared by the [place] and  has been authorized for detection and/or diagnosis of SARS-CoV-2 by FDA under an Emergency Use Authorization (EUA). This EUA will remain  in effect (meaning this test can be used) for the duration of the COVID-19 declaration under Section 564(b)(1) of the Act, 21 U.S.C.section 360bbb-3(b)(1), unless the authorization is terminated  or revoked sooner.       Influenza A by PCR NEGATIVE NEGATIVE Final   Influenza B by  PCR NEGATIVE NEGATIVE Final    Comment: (NOTE) The Xpert Xpress SARS-CoV-2/FLU/RSV plus assay is intended as an aid in the diagnosis of influenza from Nasopharyngeal swab specimens and should not be used as a sole basis for treatment. Nasal washings and aspirates are unacceptable for Xpert Xpress SARS-CoV-2/FLU/RSV testing.  Fact Sheet for Patients: EntrepreneurPulse.com.au  Fact Sheet for Healthcare Providers: IncredibleEmployment.be  This test is not yet approved or cleared by the [place] FDA and has been authorized for detection and/or diagnosis of SARS-CoV-2 by FDA under an Emergency Use Authorization (EUA). This EUA will remain in effect (meaning this test can be used) for the duration of the COVID-19 declaration under Section 564(b)(1) of the Act, 21 U.S.C. section 360bbb-3(b)(1), unless the authorization is terminated or revoked.  Performed at [facility] [address] [place], [place] [address]    Studies/Results: CT ABDOMEN PELVIS W CONTRAST  Result Date: 05/11/2020 CLINICAL DATA:  History of prior cholecystectomy and cholelithiasis with recent ERCP with new onset diarrhea and tarry stools EXAM: CT ABDOMEN AND PELVIS WITH CONTRAST TECHNIQUE: Multidetector CT imaging of the abdomen and pelvis was performed using the standard protocol following bolus administration of intravenous contrast. CONTRAST:  128mL OMNIPAQUE IOHEXOL 300 MG/ML  SOLN COMPARISON:  04/08/2020 FINDINGS: Lower chest: No acute abnormality. Hepatobiliary: Mild fatty infiltration of the liver is noted. The gallbladder has been surgically removed. Pancreas: Unremarkable. No pancreatic ductal dilatation or surrounding inflammatory changes. Spleen: Normal in size without focal abnormality. Adrenals/Urinary Tract: Adrenal glands are within normal limits. Kidneys demonstrate a normal enhancement pattern bilaterally. Renal cysts are noted on the left stable from the  prior exam. No calculi or obstructive changes are seen. Ureters are within normal limits. The bladder is partially distended. Stomach/Bowel: The appendix is within normal limits. No obstructive or inflammatory changes of the colon are seen. The stomach and small bowel are within normal limits. Vascular/Lymphatic: Aortic atherosclerosis. No enlarged abdominal or pelvic lymph nodes. Reproductive: Prostate is prominent indenting upon the inferior aspect of the bladder. Other: No abdominal wall hernia or abnormality. No abdominopelvic ascites. Musculoskeletal: Right hip replacement is seen. Degenerative changes of lumbar spine are noted. IMPRESSION: Changes of prior cholecystectomy are noted. No acute abnormality is seen. Stable renal cystic change on the left and prostatic enlargement. Electronically Signed   By: Inez Catalina M.D.   On: 05/11/2020 18:23   DG C-Arm 1-60 Min-No Report  Result Date: 05/12/2020 Fluoroscopy was utilized by the requesting physician.  No radiographic interpretation.   Medications:  I have reviewed the patient's current medications. Prior to Admission:  Medications Prior to Admission  Medication Sig Dispense Refill Last Dose  . aspirin EC 81 MG tablet Take 81 mg by mouth every other day.   05/11/2020 at Unknown time  . carvedilol (COREG) 12.5 MG tablet TAKE ONE (1) TABLET BY MOUTH TWO TIMES PER DAY 180 tablet 0 05/11/2020 at Unknown time  . ezetimibe (ZETIA) 10  MG tablet Take 1 tablet (10 mg total) by mouth daily. 90 tablet 3 05/11/2020 at Unknown time  . FARXIGA 10 MG TABS tablet Take 10 mg by mouth every morning.     . finasteride (PROSCAR) 5 MG tablet Take 1 tablet (5 mg total) by mouth daily. 90 tablet 1 05/11/2020 at Unknown time  . gabapentin (NEURONTIN) 100 MG capsule Take 100 mg by mouth 2 (two) times daily.   05/11/2020 at Unknown time  . omeprazole (PRILOSEC) 20 MG capsule TAKE 1 CAPSULE BY MOUTH TWICE DAILY 60 capsule 1 05/11/2020 at Unknown time  . ondansetron (ZOFRAN ODT) 4  MG disintegrating tablet Take 1 tablet (4 mg total) by mouth every 8 (eight) hours as needed for nausea or vomiting. 20 tablet 0 Past Week at Unknown time  . TOUJEO SOLOSTAR 300 UNIT/ML Solostar Pen INJECT 50 UNITS DAILY MAY INCREASE 2U EVERY 3 DAYS UNTIL FBG IS AT GOAL UP TO 80 UNITS DAILY 22.5 mL 6 05/11/2020 at Unknown time  . Vitamin D, Cholecalciferol, 1000 units TABS Take 1,000 Units by mouth daily.    05/11/2020 at Unknown time  . zinc gluconate 50 MG tablet Take by mouth.   05/11/2020 at Unknown time  . zolpidem (AMBIEN) 5 MG tablet Take 1 tablet (5 mg total) by mouth at bedtime as needed for sleep. Stop trazadone not helpful 30 tablet 0 05/10/2020 at Unknown time  . amLODipine (NORVASC) 5 MG tablet Take 1 tablet (5 mg total) by mouth daily. (Patient not taking: No sig reported) 90 tablet 3   . FLUZONE HIGH-DOSE QUADRIVALENT 0.7 ML SUSY       Scheduled: . carvedilol  12.5 mg Oral BID WC  . cholestyramine  4 g Oral BID  . ezetimibe  10 mg Oral Daily  . finasteride  5 mg Oral Daily  . gabapentin  100 mg Oral BID  . insulin aspart  0-15 Units Subcutaneous TID WC  . insulin aspart  0-5 Units Subcutaneous QHS  . pantoprazole  40 mg Oral Daily  . polyethylene glycol  17 g Oral Daily   Continuous: . sodium chloride Stopped (05/12/20 2030)   DJM:EQASTMHDQ, bisacodyl, hydrALAZINE, hydrOXYzine, ondansetron **OR** ondansetron (ZOFRAN) IV, oxyCODONE Anti-infectives (From admission, onward)   Start     Dose/Rate Route Frequency Ordered Stop   05/11/20 1730  piperacillin-tazobactam (ZOSYN) IVPB 3.375 g        3.375 g 100 mL/hr over 30 Minutes Intravenous  Once 05/11/20 1729 05/11/20 2141     Scheduled Meds: . carvedilol  12.5 mg Oral BID WC  . cholestyramine  4 g Oral BID  . ezetimibe  10 mg Oral Daily  . finasteride  5 mg Oral Daily  . gabapentin  100 mg Oral BID  . insulin aspart  0-15 Units Subcutaneous TID WC  . insulin aspart  0-5 Units Subcutaneous QHS  . pantoprazole  40 mg Oral Daily   . polyethylene glycol  17 g Oral Daily   Continuous Infusions: . sodium chloride Stopped (05/12/20 2030)   PRN Meds:.bisacodyl, bisacodyl, hydrALAZINE, hydrOXYzine, ondansetron **OR** ondansetron (ZOFRAN) IV, oxyCODONE   Assessment: Principal Problem:   Melena Active Problems:   Essential hypertension   BPH (benign prostatic hyperplasia)   GERD (gastroesophageal reflux disease)   CAD (coronary artery disease)   Chronic diastolic CHF (congestive heart failure) (HCC)   Type 2 diabetes mellitus with complication, without long-term current use of insulin (HCC)   CKD (chronic kidney disease) stage 3, GFR 30-59 ml/min (HCC)  Jaundice   Plan: Cholestatic hepatitis with no evidence of acute liver failure Patient underwent secondary liver disease work-up as outpatient, his serum ACE levels were low, representing chronic liver disease, rest of the secondary liver disease work-up was unremarkable Due to worsening of LFTs, and no evidence of biliary obstruction, recommend ultrasound-guided liver biopsy and patient is amenable to this procedure Monitor LFTs daily    LOS: 2 days   Rohini Vanga 05/13/2020, 4:30 PM

## 2020-05-14 ENCOUNTER — Inpatient Hospital Stay: Payer: Medicare Other

## 2020-05-14 DIAGNOSIS — R7989 Other specified abnormal findings of blood chemistry: Secondary | ICD-10-CM | POA: Diagnosis not present

## 2020-05-14 DIAGNOSIS — K921 Melena: Secondary | ICD-10-CM | POA: Diagnosis not present

## 2020-05-14 LAB — GLUCOSE, CAPILLARY
Glucose-Capillary: 144 mg/dL — ABNORMAL HIGH (ref 70–99)
Glucose-Capillary: 197 mg/dL — ABNORMAL HIGH (ref 70–99)
Glucose-Capillary: 240 mg/dL — ABNORMAL HIGH (ref 70–99)
Glucose-Capillary: 193 mg/dL — ABNORMAL HIGH (ref 70–99)

## 2020-05-14 LAB — CBC
HCT: 32.7 % — ABNORMAL LOW (ref 39.0–52.0)
Hemoglobin: 10.9 g/dL — ABNORMAL LOW (ref 13.0–17.0)
MCH: 31.7 pg (ref 26.0–34.0)
MCHC: 33.3 g/dL (ref 30.0–36.0)
MCV: 95.1 fL (ref 80.0–100.0)
Platelets: 92 10*3/uL — ABNORMAL LOW (ref 150–400)
RBC: 3.44 MIL/uL — ABNORMAL LOW (ref 4.22–5.81)
RDW: 14.3 % (ref 11.5–15.5)
WBC: 4.9 10*3/uL (ref 4.0–10.5)
nRBC: 0 % (ref 0.0–0.2)

## 2020-05-14 LAB — BASIC METABOLIC PANEL
Anion gap: 5 (ref 5–15)
BUN: 15 mg/dL (ref 8–23)
CO2: 22 mmol/L (ref 22–32)
Calcium: 8.4 mg/dL — ABNORMAL LOW (ref 8.9–10.3)
Chloride: 110 mmol/L (ref 98–111)
Creatinine, Ser: 0.98 mg/dL (ref 0.61–1.24)
GFR, Estimated: >60 mL/min (ref >60)
Glucose, Bld: 174 mg/dL — ABNORMAL HIGH (ref 70–99)
Potassium: 3.6 mmol/L (ref 3.5–5.1)
Sodium: 137 mmol/L (ref 135–145)

## 2020-05-14 LAB — HEPATIC FUNCTION PANEL
ALT: 62 U/L — ABNORMAL HIGH (ref 0–44)
AST: 48 U/L — ABNORMAL HIGH (ref 15–41)
Albumin: 2.5 g/dL — ABNORMAL LOW (ref 3.5–5.0)
Alkaline Phosphatase: 458 U/L — ABNORMAL HIGH (ref 38–126)
Bilirubin, Direct: 4.4 mg/dL — ABNORMAL HIGH (ref 0.0–0.2)
Indirect Bilirubin: 3.5 mg/dL — ABNORMAL HIGH (ref 0.3–0.9)
Total Bilirubin: 7.9 mg/dL — ABNORMAL HIGH (ref 0.3–1.2)
Total Protein: 5.3 g/dL — ABNORMAL LOW (ref 6.5–8.1)

## 2020-05-14 LAB — PHOSPHORUS: Phosphorus: 3.4 mg/dL (ref 2.5–4.6)

## 2020-05-14 LAB — HAPTOGLOBIN: Haptoglobin: 54 mg/dL (ref 38–329)

## 2020-05-14 LAB — LIPASE, BLOOD: Lipase: 24 U/L (ref 11–51)

## 2020-05-14 LAB — MAGNESIUM: Magnesium: 1.9 mg/dL (ref 1.7–2.4)

## 2020-05-14 MED ORDER — FENTANYL CITRATE (PF) 100 MCG/2ML IJ SOLN
INTRAMUSCULAR | Status: AC | PRN
  Administered 2020-05-14: 50 ug via INTRAVENOUS

## 2020-05-14 MED ORDER — MELATONIN 5 MG PO TABS
2.5000 mg | ORAL_TABLET | Freq: Every day | ORAL | Status: DC
  Administered 2020-05-14 (×2): 2.5 mg via ORAL
  Filled 2020-05-14 (×2): qty 1

## 2020-05-14 MED ORDER — FENTANYL CITRATE (PF) 100 MCG/2ML IJ SOLN
INTRAMUSCULAR | Status: AC
  Filled 2020-05-14: qty 2

## 2020-05-14 MED ORDER — MIDAZOLAM HCL 2 MG/2ML IJ SOLN
INTRAMUSCULAR | Status: AC | PRN
  Administered 2020-05-14: 1 mg via INTRAVENOUS

## 2020-05-14 MED ORDER — MIDAZOLAM HCL 2 MG/2ML IJ SOLN
INTRAMUSCULAR | Status: AC
  Filled 2020-05-14: qty 2

## 2020-05-14 MED ORDER — BISACODYL 5 MG PO TBEC
10.0000 mg | DELAYED_RELEASE_TABLET | Freq: Once | ORAL | Status: AC
  Administered 2020-05-14: 10 mg via ORAL
  Filled 2020-05-14: qty 2

## 2020-05-14 NOTE — Care Management Important Message (Signed)
Important Message  Patient Details  Name: ASAHD CAN MRN: [id number] Date of Birth: 07-26-1933   Medicare Important Message Given:  Yes     Dannette Barbara 05/14/2020, 1:58 PM

## 2020-05-14 NOTE — Progress Notes (Signed)
Patient clinically stable post Liver biopsy per Dr Annamaria Boots, report given to Lucina Mellow on 2A at bedside post procedure/recovery. Received Versed 1 mg along with Fentanyl 50 mcg IV for procedure.

## 2020-05-14 NOTE — Procedures (Signed)
Interventional Radiology Procedure Note  Procedure: [place] RANDOM LIVER BX    Complications: None  Estimated Blood Loss:  MIN  Findings: ACUTE HEPATITIS, INC'D LFTS, JAUNDICE PATH PENDING    M. Daryll Brod, MD

## 2020-05-14 NOTE — Progress Notes (Signed)
[facility] Progress Note  Patient: [name] [name]    EXH:[id number]  DOA: 05/11/2020     Date of Service: the patient was seen and examined on 05/14/2020  Chief Complaint  Patient presents with  . Rectal Bleeding   Brief hospital course:  [name] [name] is a 87 y.o. male with medical history significant for BPH, hypertension, CAD, chronic diastolic CHF, type II DM, CKD stage IIIa, choledocholithiasis s/p ERCP with biliary sphincterectomy and balloon extraction on 05/05/2020. He presented to the hospital because of 3-day history of melena.  He was admitted to the hospital for acute GI bleeding and elevated liver enzymes with jaundice.   Assessment and Plan: Principal Problem:   Melena Active Problems:   Essential hypertension   BPH (benign prostatic hyperplasia)   GERD (gastroesophageal reflux disease)   CAD (coronary artery disease)   Chronic diastolic CHF (congestive heart failure) (HCC)   Type 2 diabetes mellitus with complication, without long-term current use of insulin (HCC)   CKD (chronic kidney disease) stage 3, GFR 30-59 ml/min (HCC)   Jaundice    # Acute GI bleeding/melena: s/p ERCP. s/p IV Protonix.  3/9 Started pantoprazole 40 mg p.o. daily Monitor H&H. Follow-up with gastroenterologist for further recommendations.  # Elevated liver enzymes/jaundice: s/p ERCP 3/8 and findings are as follows: small hiatal hernia, normal stomach, normal duodenum prior biliary sphincterotomy appeared open, the biliary tree was swept and nothing was found. s/p ERCP with biliary sphincterectomy and balloon extraction of choledocholithiasis on 05/05/2020.  reticulocyte count wnl  Monitor liver enzymes and follow-up with GI. Follow lipase level, and haptoglobin to rule out other causes of jaundice HSV and CMV IgM pending 3/10 liver biopsy done, path pending, it was recommended by GI  # Chronic diastolic CHF: Compensated  # CAD: Aspirin on hold  # Constipation, continue  MiraLAX and Dulcolax   Other comorbidities include CKD stage III, type II DM, CAD, BPH, hypertension   Body mass index is 27.56 kg/m.       Diet: Regular  DVT Prophylaxis: SCD, pharmacological prophylaxis contraindicated due to Risk of bleeding   Advance goals of care discussion: Full code  Family Communication: family was present at bedside, at the time of interview.  The pt provided permission to discuss medical plan with the family. Opportunity was given to ask question and all questions were answered satisfactorily.   Disposition:  Pt is from Home, admitted with Jaundice, still has jaundice, liver biopsy done today on 3/10, which precludes a safe discharge. Discharge to home, when once cleared by GI.  Subjective: No overnight issues, still patient was complaining of mild tenderness in the upper abdomen, passing gas, patient did not move bowels yet.  We will try Dulcolax today. Patient was ready for liver biopsy, we will monitor overnight after the procedure and possible discharge tomorrow a.m.    Physical Exam: General:  alert oriented to time, place, and person.  Appear in no distress, affect appropriate Eyes: PERRLA ENT: Oral Mucosa Clear, moist  Neck: no JVD,  Cardiovascular: S1 and S2 Present, no Murmur,  Respiratory: good respiratory effort, Bilateral Air entry equal and Decreased, no Crackles, no wheezes Abdomen: Bowel Sound present, Soft and no tenderness,  Skin: no rashes Extremities: no Pedal edema, no calf tenderness Neurologic: without any new focal findings Gait not checked due to patient safety concerns  Vitals:   05/14/20 1006 05/14/20 1010 05/14/20 1030 05/14/20 1203  BP: (!) 184/63 (!) 184/78 (!) 168/78 (!) 163/77  Pulse: Marland Kitchen)  55 (!) 56  (!) 54  Resp: (!) 21 18 (!) 21 19  Temp:    97.7 F (36.5 C)  TempSrc:      SpO2: 100% 100% 100% 98%  Weight:      Height:        Intake/Output Summary (Last 24 hours) at 05/14/2020 1224 Last data filed at  05/14/2020 0850 Gross per 24 hour  Intake 383.9 ml  Output 1175 ml  Net -791.1 ml   Filed Weights   05/11/20 1403 05/11/20 2100 05/14/20 0356  Weight: 94.3 kg 94.8 kg 99.6 kg    Data Reviewed: I have personally reviewed and interpreted daily labs, tele strips, imagings as discussed above. I reviewed all nursing notes, pharmacy notes, vitals, pertinent old records I have discussed plan of care as described above with RN and patient/family.  CBC: Recent Labs  Lab 05/11/20 1411 05/11/20 2355 05/13/20 0444 05/14/20 0435  WBC 5.1 5.8 7.6 4.9  NEUTROABS  --   --  6.1  --   HGB 12.6* 12.3* 12.3* 10.9*  HCT 36.1* 34.1* 36.2* 32.7*  MCV 91.6 90.7 93.5 95.1  PLT 101* 100* 100* 92*   Basic Metabolic Panel: Recent Labs  Lab 05/11/20 1411 05/12/20 0923 05/13/20 0444 05/14/20 0435  NA 135 139 138 137  K 3.6 3.5 3.8 3.6  CL 109 111 109 110  CO2 19* 21* 23 22  GLUCOSE 223* 78 184* 174*  BUN 18 14 15 15   CREATININE 1.18 0.97 0.99 0.98  CALCIUM 8.4* 8.5* 8.5* 8.4*  MG  --   --   --  1.9  PHOS  --   --   --  3.4    Studies: US BIOPSY (LIVER)  Result Date: 05/14/2020 INDICATION: Acute hepatitis, jaundice, elevated LFTs EXAM: ULTRASOUND GUIDED CORE BIOPSY OF RIGHT HEPATIC LOBE MEDICATIONS: 1% LIDOCAINE LOCAL ANESTHESIA/SEDATION: Versed 1.0mg  IV; Fentanyl 54mcg IV; Moderate Sedation Time:  8 MINUTES The patient was continuously monitored during the procedure by the interventional radiology nurse under my direct supervision. FLUOROSCOPY TIME:  Fluoroscopy Time: NONE. COMPLICATIONS: None immediate. PROCEDURE: The procedure, risks, benefits, and alternatives were explained to the patient. Questions regarding the procedure were encouraged and answered. The patient understands and consents to the procedure. Previous imaging reviewed. Preliminary ultrasound performed. Right liver was localized in the mid axillary line through a lower intercostal space. Overlying skin marked. Under sterile  conditions and local anesthesia, a 17 gauge coaxial guide was advanced into the right hepatic lobe. Needle position confirmed with ultrasound. Images obtained for documentation. 2 18 gauge core biopsies obtained of the right hepatic lobe randomly. Samples were intact and non fragmented. These were placed in formalin. Needle tract occluded with Gel-Foam. Postprocedure imaging demonstrates no hemorrhage or hematoma. Patient tolerated biopsy well. FINDINGS: Imaging confirms needle placed in the right hepatic lobe for core biopsy IMPRESSION: Successful ultrasound right liver random core biopsy Electronically Signed   By: Jerilynn Mages.  Shick M.D.   On: 05/14/2020 10:36    Scheduled Meds: . bisacodyl  10 mg Oral Once  . carvedilol  12.5 mg Oral BID WC  . cholestyramine  4 g Oral BID  . ezetimibe  10 mg Oral Daily  . fentaNYL      . finasteride  5 mg Oral Daily  . gabapentin  100 mg Oral BID  . insulin aspart  0-15 Units Subcutaneous TID WC  . insulin aspart  0-5 Units Subcutaneous QHS  . melatonin  2.5 mg Oral QHS  .  midazolam      . pantoprazole  40 mg Oral Daily  . polyethylene glycol  17 g Oral Daily   Continuous Infusions: . sodium chloride Stopped (05/12/20 2030)   PRN Meds: bisacodyl, bisacodyl, hydrALAZINE, hydrOXYzine, ondansetron **OR** ondansetron (ZOFRAN) IV, oxyCODONE  Time spent: 35 minutes  Author: Val Riles. MD [facility] 05/14/2020 12:24 PM  To reach On-call, see care teams to locate the attending and reach out to them via [email or web address]. If 7PM-7AM, please contact night-coverage If you still have difficulty reaching the attending provider, please page the [facility] (Director on Call) for [facility] on amion for assistance.

## 2020-05-14 NOTE — Progress Notes (Signed)
[name] Darby, MD [address]  [place]  [place], [place] [address]  Main: [phone]  Fax: [phone] Pager: [phone]   Subjective: Patient underwent liver biopsy today, denies any symptoms.  Has been tolerating p.o. well   Objective: Vital signs in last 24 hours: Vitals:   05/14/20 1030 05/14/20 1203 05/14/20 1617 05/14/20 1703  BP: (!) 168/78 (!) 163/77 (!) 145/40   Pulse:  (!) 54 (!) 51 67  Resp: (!) 21 19 19    Temp:  97.7 F (36.5 C) 97.7 F (36.5 C)   TempSrc:      SpO2: 100% 98% 99%   Weight:      Height:       Weight change:   Intake/Output Summary (Last 24 hours) at 05/14/2020 1748 Last data filed at 05/14/2020 1617 Gross per 24 hour  Intake 863.9 ml  Output 1100 ml  Net -236.1 ml     Exam: Heart:: Bradycardia and regular rhythm, S1S2 present or without murmur or extra heart sounds Lungs: normal and clear to auscultation Abdomen: soft, nontender, normal bowel sounds   Lab Results: CBC Latest Ref Rng & Units 05/14/2020 05/13/2020 05/11/2020  WBC 4.0 - 10.5 K/uL 4.9 7.6 5.8  Hemoglobin 13.0 - 17.0 g/dL 10.9(L) 12.3(L) 12.3(L)  Hematocrit 39.0 - 52.0 % 32.7(L) 36.2(L) 34.1(L)  Platelets 150 - 400 K/uL 92(L) 100(L) 100(L)   CMP Latest Ref Rng & Units 05/14/2020 05/13/2020 05/12/2020  Glucose 70 - 99 mg/dL 174(H) 184(H) 78  BUN 8 - 23 mg/dL 15 15 14   Creatinine 0.61 - 1.24 mg/dL 0.98 0.99 0.97  Sodium 135 - 145 mmol/L 137 138 139  Potassium 3.5 - 5.1 mmol/L 3.6 3.8 3.5  Chloride 98 - 111 mmol/L 110 109 111  CO2 22 - 32 mmol/L 22 23 21(L)  Calcium 8.9 - 10.3 mg/dL 8.4(L) 8.5(L) 8.5(L)  Total Protein 6.5 - 8.1 g/dL 5.3(L) 5.6(L) 5.6(L)  Total Bilirubin 0.3 - 1.2 mg/dL 7.9(H) 9.4(H) 9.0(H)  Alkaline Phos 38 - 126 U/L 458(H) 521(H) 524(H)  AST 15 - 41 U/L 48(H) 72(H) 109(H)  ALT 0 - 44 U/L 62(H) 81(H) 98(H)    Micro Results: Recent Results (from the past 240 hour(s))  Resp Panel by RT-PCR (Flu A&B, Covid) Nasopharyngeal Swab     Status:  None   Collection Time: 05/11/20  5:30 PM   Specimen: Nasopharyngeal Swab; Nasopharyngeal(NP) swabs in vial transport medium  Result Value Ref Range Status   SARS Coronavirus 2 by RT PCR NEGATIVE NEGATIVE Final    Comment: (NOTE) SARS-CoV-2 target nucleic acids are NOT DETECTED.  The SARS-CoV-2 RNA is generally detectable in upper respiratory specimens during the acute phase of infection. The lowest concentration of SARS-CoV-2 viral copies this assay can detect is 138 copies/mL. A negative result does not preclude SARS-Cov-2 infection and should not be used as the sole basis for treatment or other patient management decisions. A negative result may occur with  improper specimen collection/handling, submission of specimen other than nasopharyngeal swab, presence of viral mutation(s) within the areas targeted by this assay, and inadequate number of viral copies(<138 copies/mL). A negative result must be combined with clinical observations, patient history, and epidemiological information. The expected result is Negative.  Fact Sheet for Patients:  EntrepreneurPulse.com.au  Fact Sheet for Healthcare Providers:  IncredibleEmployment.be  This test is no t yet approved or cleared by the [place] FDA and  has been authorized for detection and/or diagnosis of SARS-CoV-2 by FDA under an  Emergency Use Authorization (EUA). This EUA will remain  in effect (meaning this test can be used) for the duration of the COVID-19 declaration under Section 564(b)(1) of the Act, 21 U.S.C.section 360bbb-3(b)(1), unless the authorization is terminated  or revoked sooner.       Influenza A by PCR NEGATIVE NEGATIVE Final   Influenza B by PCR NEGATIVE NEGATIVE Final    Comment: (NOTE) The Xpert Xpress SARS-CoV-2/FLU/RSV plus assay is intended as an aid in the diagnosis of influenza from Nasopharyngeal swab specimens and should not be used as a sole basis for  treatment. Nasal washings and aspirates are unacceptable for Xpert Xpress SARS-CoV-2/FLU/RSV testing.  Fact Sheet for Patients: EntrepreneurPulse.com.au  Fact Sheet for Healthcare Providers: IncredibleEmployment.be  This test is not yet approved or cleared by the [place] FDA and has been authorized for detection and/or diagnosis of SARS-CoV-2 by FDA under an Emergency Use Authorization (EUA). This EUA will remain in effect (meaning this test can be used) for the duration of the COVID-19 declaration under Section 564(b)(1) of the Act, 21 U.S.C. section 360bbb-3(b)(1), unless the authorization is terminated or revoked.  Performed at [facility], [address] [place], [place] [address]    Studies/Results: US BIOPSY (LIVER)  Result Date: 05/14/2020 INDICATION: Acute hepatitis, jaundice, elevated LFTs EXAM: ULTRASOUND GUIDED CORE BIOPSY OF RIGHT HEPATIC LOBE MEDICATIONS: 1% LIDOCAINE LOCAL ANESTHESIA/SEDATION: Versed 1.0mg  IV; Fentanyl 22mcg IV; Moderate Sedation Time:  8 MINUTES The patient was continuously monitored during the procedure by the interventional radiology nurse under my direct supervision. FLUOROSCOPY TIME:  Fluoroscopy Time: NONE. COMPLICATIONS: None immediate. PROCEDURE: The procedure, risks, benefits, and alternatives were explained to the patient. Questions regarding the procedure were encouraged and answered. The patient understands and consents to the procedure. Previous imaging reviewed. Preliminary ultrasound performed. Right liver was localized in the mid axillary line through a lower intercostal space. Overlying skin marked. Under sterile conditions and local anesthesia, a 17 gauge coaxial guide was advanced into the right hepatic lobe. Needle position confirmed with ultrasound. Images obtained for documentation. 2 18 gauge core biopsies obtained of the right hepatic lobe randomly. Samples were intact and non  fragmented. These were placed in formalin. Needle tract occluded with Gel-Foam. Postprocedure imaging demonstrates no hemorrhage or hematoma. Patient tolerated biopsy well. FINDINGS: Imaging confirms needle placed in the right hepatic lobe for core biopsy IMPRESSION: Successful ultrasound right liver random core biopsy Electronically Signed   By: Jerilynn Mages.  Shick M.D.   On: 05/14/2020 10:36   Medications:  I have reviewed the patient's current medications. Prior to Admission:  Medications Prior to Admission  Medication Sig Dispense Refill Last Dose  . aspirin EC 81 MG tablet Take 81 mg by mouth every other day.   05/11/2020 at Unknown time  . carvedilol (COREG) 12.5 MG tablet TAKE ONE (1) TABLET BY MOUTH TWO TIMES PER DAY 180 tablet 0 05/11/2020 at Unknown time  . ezetimibe (ZETIA) 10 MG tablet Take 1 tablet (10 mg total) by mouth daily. 90 tablet 3 05/11/2020 at Unknown time  . FARXIGA 10 MG TABS tablet Take 10 mg by mouth every morning.     . finasteride (PROSCAR) 5 MG tablet Take 1 tablet (5 mg total) by mouth daily. 90 tablet 1 05/11/2020 at Unknown time  . gabapentin (NEURONTIN) 100 MG capsule Take 100 mg by mouth 2 (two) times daily.   05/11/2020 at Unknown time  . omeprazole (PRILOSEC) 20 MG capsule TAKE 1 CAPSULE BY MOUTH TWICE DAILY 60 capsule  1 05/11/2020 at Unknown time  . ondansetron (ZOFRAN ODT) 4 MG disintegrating tablet Take 1 tablet (4 mg total) by mouth every 8 (eight) hours as needed for nausea or vomiting. 20 tablet 0 Past Week at Unknown time  . TOUJEO SOLOSTAR 300 UNIT/ML Solostar Pen INJECT 50 UNITS DAILY MAY INCREASE 2U EVERY 3 DAYS UNTIL FBG IS AT GOAL UP TO 80 UNITS DAILY 22.5 mL 6 05/11/2020 at Unknown time  . Vitamin D, Cholecalciferol, 1000 units TABS Take 1,000 Units by mouth daily.    05/11/2020 at Unknown time  . zinc gluconate 50 MG tablet Take by mouth.   05/11/2020 at Unknown time  . zolpidem (AMBIEN) 5 MG tablet Take 1 tablet (5 mg total) by mouth at bedtime as needed for sleep. Stop  trazadone not helpful 30 tablet 0 05/10/2020 at Unknown time  . amLODipine (NORVASC) 5 MG tablet Take 1 tablet (5 mg total) by mouth daily. (Patient not taking: No sig reported) 90 tablet 3   . FLUZONE HIGH-DOSE QUADRIVALENT 0.7 ML SUSY       Scheduled: . carvedilol  12.5 mg Oral BID WC  . cholestyramine  4 g Oral BID  . ezetimibe  10 mg Oral Daily  . fentaNYL      . finasteride  5 mg Oral Daily  . gabapentin  100 mg Oral BID  . insulin aspart  0-15 Units Subcutaneous TID WC  . insulin aspart  0-5 Units Subcutaneous QHS  . melatonin  2.5 mg Oral QHS  . midazolam      . pantoprazole  40 mg Oral Daily  . polyethylene glycol  17 g Oral Daily   Continuous: . sodium chloride Stopped (05/12/20 2030)   VEL:[id number], bisacodyl, hydrALAZINE, hydrOXYzine, ondansetron **OR** ondansetron (ZOFRAN) IV, oxyCODONE Anti-infectives (From admission, onward)   Start     Dose/Rate Route Frequency Ordered Stop   05/11/20 1730  piperacillin-tazobactam (ZOSYN) IVPB 3.375 g        3.375 g 100 mL/hr over 30 Minutes Intravenous  Once 05/11/20 1729 05/11/20 2141     Scheduled Meds: . carvedilol  12.5 mg Oral BID WC  . cholestyramine  4 g Oral BID  . ezetimibe  10 mg Oral Daily  . fentaNYL      . finasteride  5 mg Oral Daily  . gabapentin  100 mg Oral BID  . insulin aspart  0-15 Units Subcutaneous TID WC  . insulin aspart  0-5 Units Subcutaneous QHS  . melatonin  2.5 mg Oral QHS  . midazolam      . pantoprazole  40 mg Oral Daily  . polyethylene glycol  17 g Oral Daily   Continuous Infusions: . sodium chloride Stopped (05/12/20 2030)   PRN Meds:.bisacodyl, bisacodyl, hydrALAZINE, hydrOXYzine, ondansetron **OR** ondansetron (ZOFRAN) IV, oxyCODONE   Assessment: Principal Problem:   Melena Active Problems:   Essential hypertension   BPH (benign prostatic hyperplasia)   GERD (gastroesophageal reflux disease)   CAD (coronary artery disease)   Chronic diastolic CHF (congestive heart failure)  (HCC)   Type 2 diabetes mellitus with complication, without long-term current use of insulin (HCC)   CKD (chronic kidney disease) stage 3, GFR 30-59 ml/min (HCC)   Jaundice   Plan: Cholestatic hepatitis with no evidence of acute liver failure Patient underwent secondary liver disease work-up as outpatient, his serum ACE levels were low, representing chronic liver disease, rest of the secondary liver disease work-up was unremarkable Due to worsening of LFTs, and no evidence of biliary  obstruction, s/p liver biopsy  CMV, HSV serologies are in process, EBV IgM negative Monitor LFTs daily, slowly improving, if continue to downtrend, okay to discharge home tomorrow and follow-up on the liver biopsy results as outpatient Patient will need follow-up with GI Avoid hepatotoxic agents    LOS: 3 days   Cagney Steenson 05/14/2020, 5:48 PM

## 2020-05-15 DIAGNOSIS — K921 Melena: Secondary | ICD-10-CM | POA: Diagnosis not present

## 2020-05-15 DIAGNOSIS — R17 Unspecified jaundice: Secondary | ICD-10-CM | POA: Diagnosis not present

## 2020-05-15 DIAGNOSIS — R7989 Other specified abnormal findings of blood chemistry: Secondary | ICD-10-CM | POA: Diagnosis not present

## 2020-05-15 LAB — HEPATIC FUNCTION PANEL
ALT: 56 U/L — ABNORMAL HIGH (ref 0–44)
AST: 43 U/L — ABNORMAL HIGH (ref 15–41)
Albumin: 2.6 g/dL — ABNORMAL LOW (ref 3.5–5.0)
Alkaline Phosphatase: 404 U/L — ABNORMAL HIGH (ref 38–126)
Bilirubin, Direct: 4.4 mg/dL — ABNORMAL HIGH (ref 0.0–0.2)
Indirect Bilirubin: 3.8 mg/dL — ABNORMAL HIGH (ref 0.3–0.9)
Total Bilirubin: 8.2 mg/dL — ABNORMAL HIGH (ref 0.3–1.2)
Total Protein: 5.3 g/dL — ABNORMAL LOW (ref 6.5–8.1)

## 2020-05-15 LAB — CBC
HCT: 34.1 % — ABNORMAL LOW (ref 39.0–52.0)
Hemoglobin: 11.5 g/dL — ABNORMAL LOW (ref 13.0–17.0)
MCH: 31.9 pg (ref 26.0–34.0)
MCHC: 33.7 g/dL (ref 30.0–36.0)
MCV: 94.7 fL (ref 80.0–100.0)
Platelets: 104 10*3/uL — ABNORMAL LOW (ref 150–400)
RBC: 3.6 MIL/uL — ABNORMAL LOW (ref 4.22–5.81)
RDW: 14.1 % (ref 11.5–15.5)
WBC: 5.2 10*3/uL (ref 4.0–10.5)
nRBC: 0 % (ref 0.0–0.2)

## 2020-05-15 LAB — BASIC METABOLIC PANEL
Anion gap: 6 (ref 5–15)
BUN: 14 mg/dL (ref 8–23)
CO2: 20 mmol/L — ABNORMAL LOW (ref 22–32)
Calcium: 8.3 mg/dL — ABNORMAL LOW (ref 8.9–10.3)
Chloride: 109 mmol/L (ref 98–111)
Creatinine, Ser: 1.04 mg/dL (ref 0.61–1.24)
GFR, Estimated: >60 mL/min (ref >60)
Glucose, Bld: 190 mg/dL — ABNORMAL HIGH (ref 70–99)
Potassium: 3.8 mmol/L (ref 3.5–5.1)
Sodium: 135 mmol/L (ref 135–145)

## 2020-05-15 LAB — GLUCOSE, CAPILLARY
Glucose-Capillary: 154 mg/dL — ABNORMAL HIGH (ref 70–99)
Glucose-Capillary: 183 mg/dL — ABNORMAL HIGH (ref 70–99)

## 2020-05-15 LAB — PHOSPHORUS: Phosphorus: 3.2 mg/dL (ref 2.5–4.6)

## 2020-05-15 LAB — MAGNESIUM: Magnesium: 1.8 mg/dL (ref 1.7–2.4)

## 2020-05-15 LAB — CMV IGM: CMV IgM: <30 AU/mL (ref 0.0–29.9)

## 2020-05-15 LAB — HSV(HERPES SIMPLEX VRS) I + II AB-IGM: HSVI/II Comb IgM: <0.91 Ratio (ref 0.00–0.90)

## 2020-05-15 LAB — LIPASE, BLOOD: Lipase: 24 U/L (ref 11–51)

## 2020-05-15 MED ORDER — AMLODIPINE BESYLATE 5 MG PO TABS
5.0000 mg | ORAL_TABLET | Freq: Every day | ORAL | Status: DC
  Administered 2020-05-15: 5 mg via ORAL
  Filled 2020-05-15: qty 1

## 2020-05-15 MED ORDER — CHOLESTYRAMINE 4 G PO PACK: 4.0000 g | PACK | Freq: Two times a day (BID) | ORAL | 0 refills | Status: DC

## 2020-05-15 MED ORDER — HYDROXYZINE HCL 10 MG PO TABS: 10.0000 mg | ORAL_TABLET | Freq: Three times a day (TID) | ORAL | 0 refills | Status: DC | PRN

## 2020-05-15 MED ORDER — BISACODYL 5 MG PO TBEC: 10.0000 mg | DELAYED_RELEASE_TABLET | Freq: Every day | ORAL | 0 refills | Status: DC | PRN

## 2020-05-15 MED ORDER — POLYETHYLENE GLYCOL 3350 17 G PO PACK: 17.0000 g | PACK | Freq: Every day | ORAL | 0 refills | Status: DC

## 2020-05-15 NOTE — Progress Notes (Signed)
[name] Darby, MD [address]  [place]  [place], [place] [address]  Main: [phone]  Fax: [phone] Pager: [phone]   Subjective: Patient reports generalized body pruritus and therefore unable to sleep.  Has been tolerating p.o. well.  He did not have a bowel movement since Sunday.  He states he has been taking MiraLAX every day   Objective: Vital signs in last 24 hours: Vitals:   05/15/20 0053 05/15/20 0427 05/15/20 0809 05/15/20 1125  BP: (!) 144/45 (!) 173/50 (!) 179/95 126/89  Pulse: 61 (!) 54 (!) 55 (!) 57  Resp: 18 18 18 17   Temp: 98.2 F (36.8 C) 98.2 F (36.8 C) 98 F (36.7 C) 98 F (36.7 C)  TempSrc:      SpO2: 95% 96% 99% 95%  Weight:  99.2 kg    Height:       Weight change: -0.4 kg  Intake/Output Summary (Last 24 hours) at 05/15/2020 1258 Last data filed at 05/15/2020 1017 Gross per 24 hour  Intake 1200 ml  Output 850 ml  Net 350 ml     Exam: Heart:: Bradycardia and regular rhythm, S1S2 present or without murmur or extra heart sounds Lungs: normal and clear to auscultation Abdomen: soft, nontender, normal bowel sounds   Lab Results: CBC Latest Ref Rng & Units 05/15/2020 05/14/2020 05/13/2020  WBC 4.0 - 10.5 K/uL 5.2 4.9 7.6  Hemoglobin 13.0 - 17.0 g/dL 11.5(L) 10.9(L) 12.3(L)  Hematocrit 39.0 - 52.0 % 34.1(L) 32.7(L) 36.2(L)  Platelets 150 - 400 K/uL 104(L) 92(L) 100(L)   CMP Latest Ref Rng & Units 05/15/2020 05/14/2020 05/13/2020  Glucose 70 - 99 mg/dL 190(H) 174(H) 184(H)  BUN 8 - 23 mg/dL 14 15 15   Creatinine 0.61 - 1.24 mg/dL 1.04 0.98 0.99  Sodium 135 - 145 mmol/L 135 137 138  Potassium 3.5 - 5.1 mmol/L 3.8 3.6 3.8  Chloride 98 - 111 mmol/L 109 110 109  CO2 22 - 32 mmol/L 20(L) 22 23  Calcium 8.9 - 10.3 mg/dL 8.3(L) 8.4(L) 8.5(L)  Total Protein 6.5 - 8.1 g/dL 5.3(L) 5.3(L) 5.6(L)  Total Bilirubin 0.3 - 1.2 mg/dL 8.2(H) 7.9(H) 9.4(H)  Alkaline Phos 38 - 126 U/L 404(H) 458(H) 521(H)  AST 15 - 41 U/L 43(H) 48(H) 72(H)  ALT  0 - 44 U/L 56(H) 62(H) 81(H)    Micro Results: Recent Results (from the past 240 hour(s))  Resp Panel by RT-PCR (Flu A&B, Covid) Nasopharyngeal Swab     Status: None   Collection Time: 05/11/20  5:30 PM   Specimen: Nasopharyngeal Swab; Nasopharyngeal(NP) swabs in vial transport medium  Result Value Ref Range Status   SARS Coronavirus 2 by RT PCR NEGATIVE NEGATIVE Final    Comment: (NOTE) SARS-CoV-2 target nucleic acids are NOT DETECTED.  The SARS-CoV-2 RNA is generally detectable in upper respiratory specimens during the acute phase of infection. The lowest concentration of SARS-CoV-2 viral copies this assay can detect is 138 copies/mL. A negative result does not preclude SARS-Cov-2 infection and should not be used as the sole basis for treatment or other patient management decisions. A negative result may occur with  improper specimen collection/handling, submission of specimen other than nasopharyngeal swab, presence of viral mutation(s) within the areas targeted by this assay, and inadequate number of viral copies(<138 copies/mL). A negative result must be combined with clinical observations, patient history, and epidemiological information. The expected result is Negative.  Fact Sheet for Patients:  EntrepreneurPulse.com.au  Fact Sheet for Healthcare Providers:  IncredibleEmployment.be  This test is no t yet approved or cleared by the [place] and  has been authorized for detection and/or diagnosis of SARS-CoV-2 by FDA under an Emergency Use Authorization (EUA). This EUA will remain  in effect (meaning this test can be used) for the duration of the COVID-19 declaration under Section 564(b)(1) of the Act, 21 U.S.C.section 360bbb-3(b)(1), unless the authorization is terminated  or revoked sooner.       Influenza A by PCR NEGATIVE NEGATIVE Final   Influenza B by PCR NEGATIVE NEGATIVE Final    Comment: (NOTE) The Xpert Xpress  SARS-CoV-2/FLU/RSV plus assay is intended as an aid in the diagnosis of influenza from Nasopharyngeal swab specimens and should not be used as a sole basis for treatment. Nasal washings and aspirates are unacceptable for Xpert Xpress SARS-CoV-2/FLU/RSV testing.  Fact Sheet for Patients: EntrepreneurPulse.com.au  Fact Sheet for Healthcare Providers: IncredibleEmployment.be  This test is not yet approved or cleared by the [place] FDA and has been authorized for detection and/or diagnosis of SARS-CoV-2 by FDA under an Emergency Use Authorization (EUA). This EUA will remain in effect (meaning this test can be used) for the duration of the COVID-19 declaration under Section 564(b)(1) of the Act, 21 U.S.C. section 360bbb-3(b)(1), unless the authorization is terminated or revoked.  Performed at [facility], [address] [place], [place] [address]    Studies/Results: US BIOPSY (LIVER)  Result Date: 05/14/2020 INDICATION: Acute hepatitis, jaundice, elevated LFTs EXAM: ULTRASOUND GUIDED CORE BIOPSY OF RIGHT HEPATIC LOBE MEDICATIONS: 1% LIDOCAINE LOCAL ANESTHESIA/SEDATION: Versed 1.0mg  IV; Fentanyl 16mcg IV; Moderate Sedation Time:  8 MINUTES The patient was continuously monitored during the procedure by the interventional radiology nurse under my direct supervision. FLUOROSCOPY TIME:  Fluoroscopy Time: NONE. COMPLICATIONS: None immediate. PROCEDURE: The procedure, risks, benefits, and alternatives were explained to the patient. Questions regarding the procedure were encouraged and answered. The patient understands and consents to the procedure. Previous imaging reviewed. Preliminary ultrasound performed. Right liver was localized in the mid axillary line through a lower intercostal space. Overlying skin marked. Under sterile conditions and local anesthesia, a 17 gauge coaxial guide was advanced into the right hepatic lobe. Needle position  confirmed with ultrasound. Images obtained for documentation. 2 18 gauge core biopsies obtained of the right hepatic lobe randomly. Samples were intact and non fragmented. These were placed in formalin. Needle tract occluded with Gel-Foam. Postprocedure imaging demonstrates no hemorrhage or hematoma. Patient tolerated biopsy well. FINDINGS: Imaging confirms needle placed in the right hepatic lobe for core biopsy IMPRESSION: Successful ultrasound right liver random core biopsy Electronically Signed   By: Jerilynn Mages.  Shick M.D.   On: 05/14/2020 10:36   Medications:  I have reviewed the patient's current medications. Prior to Admission:  Medications Prior to Admission  Medication Sig Dispense Refill Last Dose  . aspirin EC 81 MG tablet Take 81 mg by mouth every other day.   05/11/2020 at Unknown time  . carvedilol (COREG) 12.5 MG tablet TAKE ONE (1) TABLET BY MOUTH TWO TIMES PER DAY 180 tablet 0 05/11/2020 at Unknown time  . ezetimibe (ZETIA) 10 MG tablet Take 1 tablet (10 mg total) by mouth daily. 90 tablet 3 05/11/2020 at Unknown time  . FARXIGA 10 MG TABS tablet Take 10 mg by mouth every morning.     . finasteride (PROSCAR) 5 MG tablet Take 1 tablet (5 mg total) by mouth daily. 90 tablet 1 05/11/2020 at Unknown time  . gabapentin (NEURONTIN) 100 MG capsule Take 100  mg by mouth 2 (two) times daily.   05/11/2020 at Unknown time  . omeprazole (PRILOSEC) 20 MG capsule TAKE 1 CAPSULE BY MOUTH TWICE DAILY 60 capsule 1 05/11/2020 at Unknown time  . ondansetron (ZOFRAN ODT) 4 MG disintegrating tablet Take 1 tablet (4 mg total) by mouth every 8 (eight) hours as needed for nausea or vomiting. 20 tablet 0 Past Week at Unknown time  . TOUJEO SOLOSTAR 300 UNIT/ML Solostar Pen INJECT 50 UNITS DAILY MAY INCREASE 2U EVERY 3 DAYS UNTIL FBG IS AT GOAL UP TO 80 UNITS DAILY 22.5 mL 6 05/11/2020 at Unknown time  . Vitamin D, Cholecalciferol, 1000 units TABS Take 1,000 Units by mouth daily.    05/11/2020 at Unknown time  . zinc gluconate 50 MG  tablet Take by mouth.   05/11/2020 at Unknown time  . zolpidem (AMBIEN) 5 MG tablet Take 1 tablet (5 mg total) by mouth at bedtime as needed for sleep. Stop trazadone not helpful 30 tablet 0 05/10/2020 at Unknown time  . amLODipine (NORVASC) 5 MG tablet Take 1 tablet (5 mg total) by mouth daily. (Patient not taking: No sig reported) 90 tablet 3   . FLUZONE HIGH-DOSE QUADRIVALENT 0.7 ML SUSY       Scheduled: . amLODipine  5 mg Oral Daily  . carvedilol  12.5 mg Oral BID WC  . cholestyramine  4 g Oral BID  . ezetimibe  10 mg Oral Daily  . finasteride  5 mg Oral Daily  . gabapentin  100 mg Oral BID  . insulin aspart  0-15 Units Subcutaneous TID WC  . insulin aspart  0-5 Units Subcutaneous QHS  . melatonin  2.5 mg Oral QHS  . pantoprazole  40 mg Oral Daily  . polyethylene glycol  17 g Oral Daily   Continuous: . sodium chloride Stopped (05/12/20 2030)   QMG:[id number], bisacodyl, hydrALAZINE, hydrOXYzine, ondansetron **OR** ondansetron (ZOFRAN) IV, oxyCODONE Anti-infectives (From admission, onward)   Start     Dose/Rate Route Frequency Ordered Stop   05/11/20 1730  piperacillin-tazobactam (ZOSYN) IVPB 3.375 g        3.375 g 100 mL/hr over 30 Minutes Intravenous  Once 05/11/20 1729 05/11/20 2141     Scheduled Meds: . amLODipine  5 mg Oral Daily  . carvedilol  12.5 mg Oral BID WC  . cholestyramine  4 g Oral BID  . ezetimibe  10 mg Oral Daily  . finasteride  5 mg Oral Daily  . gabapentin  100 mg Oral BID  . insulin aspart  0-15 Units Subcutaneous TID WC  . insulin aspart  0-5 Units Subcutaneous QHS  . melatonin  2.5 mg Oral QHS  . pantoprazole  40 mg Oral Daily  . polyethylene glycol  17 g Oral Daily   Continuous Infusions: . sodium chloride Stopped (05/12/20 2030)   PRN Meds:.bisacodyl, bisacodyl, hydrALAZINE, hydrOXYzine, ondansetron **OR** ondansetron (ZOFRAN) IV, oxyCODONE   Assessment: Principal Problem:   Melena Active Problems:   Essential hypertension   BPH (benign  prostatic hyperplasia)   GERD (gastroesophageal reflux disease)   CAD (coronary artery disease)   Chronic diastolic CHF (congestive heart failure) (HCC)   Type 2 diabetes mellitus with complication, without long-term current use of insulin (HCC)   CKD (chronic kidney disease) stage 3, GFR 30-59 ml/min (HCC)   Jaundice   Plan: Cholestatic hepatitis with no evidence of acute liver failure Patient underwent secondary liver disease work-up as outpatient, his serum ACE levels were low, representing chronic liver disease, rest of  the secondary liver disease work-up was unremarkable Due to worsening of LFTs, and no evidence of biliary obstruction, s/p liver biopsy  CMV IgM negative, HSV serologies are in process, EBV IgM negative His LFTs are fairly stable, T bili still high but transaminases are improving, okay to discharge home today and follow-up on the liver biopsy results as outpatient Patient will need follow-up with his primary GI, Dr. Bonna Gains in 2 to 3 weeks Avoid hepatotoxic agents Generalized pruritus from cholestasis, recommend hydroxyzine 1-2 times daily  Constipation Advised him to take over-the-counter bottle of magnesium citrate today    LOS: 4 days   Rohini Vanga 05/15/2020, 12:58 PM

## 2020-05-15 NOTE — Discharge Summary (Signed)
[facility] Discharge Summary   Patient: [name] [name] WPY:[id number]  PCP: [name], MD  Date of admission: 05/11/2020   Date of discharge:  05/15/2020     Discharge Diagnoses:  Principal Problem:   Melena Active Problems:   Essential hypertension   BPH (benign prostatic hyperplasia)   GERD (gastroesophageal reflux disease)   CAD (coronary artery disease)   Chronic diastolic CHF (congestive heart failure) ([place])   Type 2 diabetes mellitus with complication, without long-term current use of insulin (HCC)   CKD (chronic kidney disease) stage 3, GFR 30-59 ml/min (HCC)   Jaundice   Admitted From: Home Disposition:  Home   Recommendations for Outpatient Follow-up:  1. PCP: In 1 week,  2. GI in 1 week for liver biopsy report and LFTs 3. Follow up LABS/TEST: Repeat LFTs in 1 week   Diet recommendation: Regular diet  Activity: The patient is advised to gradually reintroduce usual activities, as tolerated  Discharge Condition: stable  Code Status: Full code   History of present illness: As per the H and P dictated on admission Hospital Course:  [name] [name] a 87 y.o.malewith medical history significant for BPH, hypertension, CAD, chronic diastolic CHF, type II DM, CKD stage IIIa, choledocholithiasis s/p ERCP with biliary sphincterectomy and balloon extraction on 05/05/2020. He presented to the hospital because of 3-day history of melena. He was admitted to the hospital for acute GIbleeding and elevated liver enzymeswith jaundice. # Acute GI bleeding/melena:s/pERCP.s/p IV Protonix. 3/9 Started pantoprazole 40 mg p.o. daily.  Continue PPI.  H&H remained stable.  Follow GI as an outpatient # Elevated liver enzymes/jaundice:s/p ERCP 3/8and findings are as follows: small hiatal hernia, normal stomach,normal duodenum prior biliary sphincterotomy appeared open, the biliary tree was swept and nothing was found. s/pERCP with biliary sphincterectomy and  balloon extraction of choledocholithiasis on 05/05/2020. reticulocyte count wnl, haptoglobin level <10 low.  Lipase remains stable. HSV and CMV IgM negative. 3/10 liver biopsy done, path pending.  Patient was cleared by GI to discharge and follow path report as an outpatient.  Patient was prescribed cholestyramine twice daily and hydroxyzine as needed for itching.  # Chronic diastolic CHF: Compensated # CAD: Aspirin resumed on discharge # Constipation, continue MiraLAX and Dulcolax Other comorbidities include CKD stage III, type II DM, CAD, BPH, hypertension Body mass index is 28.85 kg/m.   Patient was ambulatory without any assistance. On the day of the discharge the patient's vitals were stable, and no other acute medical condition were reported by patient. the patient was felt safe to be discharge at Home   Consultants: GI Procedures: s/p ERCP and liver biopsy  Discharge Exam: General: Appear in no distress, no Rash; Oral Mucosa Clear, moist. Cardiovascular: S1 and S2 Present, no Murmur, Respiratory: normal respiratory effort, Bilateral Air entry present and no Crackles, no wheezes Abdomen: Bowel Sound present, Soft and no tenderness, no hernia Extremities: no Pedal edema, no calf tenderness Neurology: alert and oriented to time, place, and person affect appropriate.  Filed Weights   05/11/20 2100 05/14/20 0356 05/15/20 0427  Weight: 94.8 kg 99.6 kg 99.2 kg   Vitals:   05/15/20 0809 05/15/20 1125  BP: (!) 179/95 126/89  Pulse: (!) 55 (!) 57  Resp: 18 17  Temp: 98 F (36.7 C) 98 F (36.7 C)  SpO2: 99% 95%    DISCHARGE MEDICATION: Allergies as of 05/15/2020      Reactions   Cortisone Other (See Comments)   Reaction: Leg Cramps   Metformin  Diarrhea   Metformin And Related Diarrhea      Medication List    TAKE these medications   amLODipine 5 MG tablet Commonly known as: NORVASC Take 1 tablet (5 mg total) by mouth daily.   aspirin EC 81 MG tablet Take 81 mg by mouth  every other day.   bisacodyl 5 MG EC tablet Commonly known as: DULCOLAX Take 2 tablets (10 mg total) by mouth daily as needed for moderate constipation.   carvedilol 12.5 MG tablet Commonly known as: COREG TAKE ONE (1) TABLET BY MOUTH TWO TIMES PER DAY   cholestyramine 4 g packet Commonly known as: QUESTRAN Take 1 packet (4 g total) by mouth 2 (two) times daily for 14 days.   ezetimibe 10 MG tablet Commonly known as: ZETIA Take 1 tablet (10 mg total) by mouth daily.   Farxiga 10 MG Tabs tablet Generic drug: dapagliflozin propanediol Take 10 mg by mouth every morning.   finasteride 5 MG tablet Commonly known as: PROSCAR Take 1 tablet (5 mg total) by mouth daily.   Fluzone High-Dose Quadrivalent 0.7 ML Susy Generic drug: Influenza Vac High-Dose Quad   gabapentin 100 MG capsule Commonly known as: NEURONTIN Take 100 mg by mouth 2 (two) times daily.   hydrOXYzine 10 MG tablet Commonly known as: ATARAX/VISTARIL Take 1 tablet (10 mg total) by mouth 3 (three) times daily as needed for itching.   omeprazole 20 MG capsule Commonly known as: PRILOSEC TAKE 1 CAPSULE BY MOUTH TWICE DAILY   ondansetron 4 MG disintegrating tablet Commonly known as: Zofran ODT Take 1 tablet (4 mg total) by mouth every 8 (eight) hours as needed for nausea or vomiting.   polyethylene glycol 17 g packet Commonly known as: MIRALAX / GLYCOLAX Take 17 g by mouth daily. Start taking on: May 16, 2020   Toujeo SoloStar 300 UNIT/ML Solostar Pen Generic drug: insulin glargine (1 Unit Dial) INJECT 50 UNITS DAILY MAY INCREASE 2U EVERY 3 DAYS UNTIL FBG IS AT GOAL UP TO 80 UNITS DAILY   Vitamin D (Cholecalciferol) 25 MCG (1000 UT) Tabs Take 1,000 Units by mouth daily.   zinc gluconate 50 MG tablet Take by mouth.   zolpidem 5 MG tablet Commonly known as: AMBIEN Take 1 tablet (5 mg total) by mouth at bedtime as needed for sleep. Stop trazadone not helpful            Discharge Care Instructions   (From admission, onward)         Start     Ordered   05/15/20 0000  If the dressing is still on your incision site when you go home, remove it on the third day after your surgery date. Remove dressing if it begins to fall off, or if it is dirty or damaged before the third day.        05/15/20 1201         Allergies  Allergen Reactions  . Cortisone Other (See Comments)    Reaction: Leg Cramps  . Metformin Diarrhea  . Metformin And Related Diarrhea   Discharge Instructions    Call MD for:   Complete by: As directed    Abdominal pain, nausea or vomiting   Diet - low sodium heart healthy   Complete by: As directed    Discharge instructions   Complete by: As directed    Follow with PCP in 1 week, repeat LFTs in 1 week GI in 1 week as an outpatient for liver biopsy report and further  work-up for jaundice   If the dressing is still on your incision site when you go home, remove it on the third day after your surgery date. Remove dressing if it begins to fall off, or if it is dirty or damaged before the third day.   Complete by: As directed    Increase activity slowly   Complete by: As directed    No wound care   Complete by: As directed       The results of significant diagnostics from this hospitalization (including imaging, microbiology, ancillary and laboratory) are listed below for reference.    Significant Diagnostic Studies: CT ABDOMEN PELVIS W CONTRAST  Result Date: 05/11/2020 CLINICAL DATA:  History of prior cholecystectomy and cholelithiasis with recent ERCP with new onset diarrhea and tarry stools EXAM: CT ABDOMEN AND PELVIS WITH CONTRAST TECHNIQUE: Multidetector CT imaging of the abdomen and pelvis was performed using the standard protocol following bolus administration of intravenous contrast. CONTRAST:  157mL OMNIPAQUE IOHEXOL 300 MG/ML  SOLN COMPARISON:  04/08/2020 FINDINGS: Lower chest: No acute abnormality. Hepatobiliary: Mild fatty infiltration of the liver is  noted. The gallbladder has been surgically removed. Pancreas: Unremarkable. No pancreatic ductal dilatation or surrounding inflammatory changes. Spleen: Normal in size without focal abnormality. Adrenals/Urinary Tract: Adrenal glands are within normal limits. Kidneys demonstrate a normal enhancement pattern bilaterally. Renal cysts are noted on the left stable from the prior exam. No calculi or obstructive changes are seen. Ureters are within normal limits. The bladder is partially distended. Stomach/Bowel: The appendix is within normal limits. No obstructive or inflammatory changes of the colon are seen. The stomach and small bowel are within normal limits. Vascular/Lymphatic: Aortic atherosclerosis. No enlarged abdominal or pelvic lymph nodes. Reproductive: Prostate is prominent indenting upon the inferior aspect of the bladder. Other: No abdominal wall hernia or abnormality. No abdominopelvic ascites. Musculoskeletal: Right hip replacement is seen. Degenerative changes of lumbar spine are noted. IMPRESSION: Changes of prior cholecystectomy are noted. No acute abnormality is seen. Stable renal cystic change on the left and prostatic enlargement. Electronically Signed   By: Inez Catalina M.D.   On: 05/11/2020 18:23   MR 3D Recon At Scanner  Result Date: 04/29/2020 CLINICAL DATA:  Right upper quadrant abdominal pain and tenderness to palpation. Elevated liver enzymes. Ultrasound unrevealing. Remote history of cholecystectomy. EXAM: MRI ABDOMEN WITHOUT AND WITH CONTRAST (INCLUDING MRCP) TECHNIQUE: Multiplanar multisequence MR imaging of the abdomen was performed both before and after the administration of intravenous contrast. Heavily T2-weighted images of the biliary and pancreatic ducts were obtained, and three-dimensional MRCP images were rendered by post processing. CONTRAST:  36mL GADAVIST GADOBUTROL 1 MMOL/ML IV SOLN COMPARISON:  04/08/2020 abdominal sonogram and CT abdomen/pelvis. FINDINGS: Lower chest: No  acute abnormality at the lung bases. Hepatobiliary: Normal liver size and configuration. No hepatic steatosis. There is a 0.5 cm segment 5 right liver hemangioma (series 240/image 31). No suspicious liver masses. Cholecystectomy. No significant biliary ductal dilatation. Common bile duct diameter 5 mm. There is a solitary 6 mm stone in the lower third of the common bile duct (series 14/image 13). No additional biliary filling defects. No biliary masses, strictures or beading. Pancreas: No pancreatic mass or duct dilation.  No pancreas divisum. Spleen: Normal size. No mass. Adrenals/Urinary Tract: Normal adrenals. No hydronephrosis. Homogeneous T1 hyperintense 1.3 cm renal cortical lesion in the medial lower right kidney (series 16/image 62) without convincing enhancement on motion degraded subtraction sequences compatible with Bosniak category 2 hemorrhagic/proteinaceous renal cyst. Additional exophytic Bosniak  category 2 renal cysts in the lower left kidney with layering hemorrhagic material and no enhancement, largest 2.5 cm anteriorly (series 16/image 66). No suspicious renal masses. Stomach/Bowel: Normal non-distended stomach. Visualized small and large bowel is normal caliber, with no bowel wall thickening. Vascular/Lymphatic: Atherosclerotic nonaneurysmal abdominal aorta. Patent portal, splenic, hepatic and renal veins. No pathologically enlarged lymph nodes in the abdomen. Other: No abdominal ascites or focal fluid collection. Enlarged prostate incidentally noted. Musculoskeletal: No aggressive appearing focal osseous lesions. IMPRESSION: 1. Choledocholithiasis with solitary 6 mm stone in the lower third of the common bile duct. No significant biliary ductal dilatation. CBD diameter 5 mm. 2. Small Bosniak category 1 and category 2 renal cysts. No suspicious renal masses. These results will be called to the ordering clinician or representative by the Radiology Department at the imaging location. Electronically  Signed   By: Ilona Sorrel M.D.   On: 04/29/2020 12:42   US BIOPSY (LIVER)  Result Date: 05/14/2020 INDICATION: Acute hepatitis, jaundice, elevated LFTs EXAM: ULTRASOUND GUIDED CORE BIOPSY OF RIGHT HEPATIC LOBE MEDICATIONS: 1% LIDOCAINE LOCAL ANESTHESIA/SEDATION: Versed 1.0mg  IV; Fentanyl 29mcg IV; Moderate Sedation Time:  8 MINUTES The patient was continuously monitored during the procedure by the interventional radiology nurse under my direct supervision. FLUOROSCOPY TIME:  Fluoroscopy Time: NONE. COMPLICATIONS: None immediate. PROCEDURE: The procedure, risks, benefits, and alternatives were explained to the patient. Questions regarding the procedure were encouraged and answered. The patient understands and consents to the procedure. Previous imaging reviewed. Preliminary ultrasound performed. Right liver was localized in the mid axillary line through a lower intercostal space. Overlying skin marked. Under sterile conditions and local anesthesia, a 17 gauge coaxial guide was advanced into the right hepatic lobe. Needle position confirmed with ultrasound. Images obtained for documentation. 2 18 gauge core biopsies obtained of the right hepatic lobe randomly. Samples were intact and non fragmented. These were placed in formalin. Needle tract occluded with Gel-Foam. Postprocedure imaging demonstrates no hemorrhage or hematoma. Patient tolerated biopsy well. FINDINGS: Imaging confirms needle placed in the right hepatic lobe for core biopsy IMPRESSION: Successful ultrasound right liver random core biopsy Electronically Signed   By: Jerilynn Mages.  Shick M.D.   On: 05/14/2020 10:36   DG C-Arm 1-60 Min-No Report  Result Date: 05/12/2020 Fluoroscopy was utilized by the requesting physician.  No radiographic interpretation.   DG C-Arm 1-60 Min-No Report  Result Date: 05/05/2020 Fluoroscopy was utilized by the requesting physician.  No radiographic interpretation.   MR ABDOMEN MRCP W WO CONTAST  Result Date:  04/29/2020 CLINICAL DATA:  Right upper quadrant abdominal pain and tenderness to palpation. Elevated liver enzymes. Ultrasound unrevealing. Remote history of cholecystectomy. EXAM: MRI ABDOMEN WITHOUT AND WITH CONTRAST (INCLUDING MRCP) TECHNIQUE: Multiplanar multisequence MR imaging of the abdomen was performed both before and after the administration of intravenous contrast. Heavily T2-weighted images of the biliary and pancreatic ducts were obtained, and three-dimensional MRCP images were rendered by post processing. CONTRAST:  39mL GADAVIST GADOBUTROL 1 MMOL/ML IV SOLN COMPARISON:  04/08/2020 abdominal sonogram and CT abdomen/pelvis. FINDINGS: Lower chest: No acute abnormality at the lung bases. Hepatobiliary: Normal liver size and configuration. No hepatic steatosis. There is a 0.5 cm segment 5 right liver hemangioma (series 240/image 31). No suspicious liver masses. Cholecystectomy. No significant biliary ductal dilatation. Common bile duct diameter 5 mm. There is a solitary 6 mm stone in the lower third of the common bile duct (series 14/image 13). No additional biliary filling defects. No biliary masses, strictures or beading. Pancreas: No pancreatic  mass or duct dilation.  No pancreas divisum. Spleen: Normal size. No mass. Adrenals/Urinary Tract: Normal adrenals. No hydronephrosis. Homogeneous T1 hyperintense 1.3 cm renal cortical lesion in the medial lower right kidney (series 16/image 62) without convincing enhancement on motion degraded subtraction sequences compatible with Bosniak category 2 hemorrhagic/proteinaceous renal cyst. Additional exophytic Bosniak category 2 renal cysts in the lower left kidney with layering hemorrhagic material and no enhancement, largest 2.5 cm anteriorly (series 16/image 66). No suspicious renal masses. Stomach/Bowel: Normal non-distended stomach. Visualized small and large bowel is normal caliber, with no bowel wall thickening. Vascular/Lymphatic: Atherosclerotic  nonaneurysmal abdominal aorta. Patent portal, splenic, hepatic and renal veins. No pathologically enlarged lymph nodes in the abdomen. Other: No abdominal ascites or focal fluid collection. Enlarged prostate incidentally noted. Musculoskeletal: No aggressive appearing focal osseous lesions. IMPRESSION: 1. Choledocholithiasis with solitary 6 mm stone in the lower third of the common bile duct. No significant biliary ductal dilatation. CBD diameter 5 mm. 2. Small Bosniak category 1 and category 2 renal cysts. No suspicious renal masses. These results will be called to the ordering clinician or representative by the Radiology Department at the imaging location. Electronically Signed   By: Ilona Sorrel M.D.   On: 04/29/2020 12:42    Microbiology: Recent Results (from the past 240 hour(s))  Resp Panel by RT-PCR (Flu A&B, Covid) Nasopharyngeal Swab     Status: None   Collection Time: 05/11/20  5:30 PM   Specimen: Nasopharyngeal Swab; Nasopharyngeal(NP) swabs in vial transport medium  Result Value Ref Range Status   SARS Coronavirus 2 by RT PCR NEGATIVE NEGATIVE Final    Comment: (NOTE) SARS-CoV-2 target nucleic acids are NOT DETECTED.  The SARS-CoV-2 RNA is generally detectable in upper respiratory specimens during the acute phase of infection. The lowest concentration of SARS-CoV-2 viral copies this assay can detect is 138 copies/mL. A negative result does not preclude SARS-Cov-2 infection and should not be used as the sole basis for treatment or other patient management decisions. A negative result may occur with  improper specimen collection/handling, submission of specimen other than nasopharyngeal swab, presence of viral mutation(s) within the areas targeted by this assay, and inadequate number of viral copies(<138 copies/mL). A negative result must be combined with clinical observations, patient history, and epidemiological information. The expected result is Negative.  Fact Sheet for  Patients:  EntrepreneurPulse.com.au  Fact Sheet for Healthcare Providers:  IncredibleEmployment.be  This test is no t yet approved or cleared by the [place] FDA and  has been authorized for detection and/or diagnosis of SARS-CoV-2 by FDA under an Emergency Use Authorization (EUA). This EUA will remain  in effect (meaning this test can be used) for the duration of the COVID-19 declaration under Section 564(b)(1) of the Act, 21 U.S.C.section 360bbb-3(b)(1), unless the authorization is terminated  or revoked sooner.       Influenza A by PCR NEGATIVE NEGATIVE Final   Influenza B by PCR NEGATIVE NEGATIVE Final    Comment: (NOTE) The Xpert Xpress SARS-CoV-2/FLU/RSV plus assay is intended as an aid in the diagnosis of influenza from Nasopharyngeal swab specimens and should not be used as a sole basis for treatment. Nasal washings and aspirates are unacceptable for Xpert Xpress SARS-CoV-2/FLU/RSV testing.  Fact Sheet for Patients: EntrepreneurPulse.com.au  Fact Sheet for Healthcare Providers: IncredibleEmployment.be  This test is not yet approved or cleared by the [place] FDA and has been authorized for detection and/or diagnosis of SARS-CoV-2 by FDA under an Emergency Use Authorization (EUA). This EUA  will remain in effect (meaning this test can be used) for the duration of the COVID-19 declaration under Section 564(b)(1) of the Act, 21 U.S.C. section 360bbb-3(b)(1), unless the authorization is terminated or revoked.  Performed at [facility], [address] [place], [place] [address]      Labs: CBC: Recent Labs  Lab 05/11/20 1411 05/11/20 2355 05/13/20 0444 05/14/20 0435 05/15/20 0536  WBC 5.1 5.8 7.6 4.9 5.2  NEUTROABS  --   --  6.1  --   --   HGB 12.6* 12.3* 12.3* 10.9* 11.5*  HCT 36.1* 34.1* 36.2* 32.7* 34.1*  MCV 91.6 90.7 93.5 95.1 94.7  PLT 101* 100* 100* 92* 104*    Basic Metabolic Panel: Recent Labs  Lab 05/11/20 1411 05/12/20 0923 05/13/20 0444 05/14/20 0435 05/15/20 0536  NA 135 139 138 137 135  K 3.6 3.5 3.8 3.6 3.8  CL 109 111 109 110 109  CO2 19* 21* 23 22 20*  GLUCOSE 223* 78 184* 174* 190*  BUN 18 14 15 15 14   CREATININE 1.18 0.97 0.99 0.98 1.04  CALCIUM 8.4* 8.5* 8.5* 8.4* 8.3*  MG  --   --   --  1.9 1.8  PHOS  --   --   --  3.4 3.2   Liver Function Tests: Recent Labs  Lab 05/11/20 1411 05/12/20 0923 05/13/20 0444 05/14/20 0435 05/15/20 0536  AST 135* 109* 72* 48* 43*  ALT 112* 98* 81* 62* 56*  ALKPHOS 555* 524* 521* 458* 404*  BILITOT 9.0* 9.0* 9.4* 7.9* 8.2*  PROT 6.1* 5.6* 5.6* 5.3* 5.3*  ALBUMIN 3.0* 2.7* 2.7* 2.5* 2.6*   Recent Labs  Lab 05/11/20 1411 05/13/20 0444 05/14/20 0435 05/15/20 0536  LIPASE 36 25 24 24    No results for input(s): AMMONIA in the last 168 hours. Cardiac Enzymes: No results for input(s): CKTOTAL, CKMB, CKMBINDEX, TROPONINI in the last 168 hours. BNP (last 3 results) No results for input(s): BNP in the last 8760 hours. CBG: Recent Labs  Lab 05/14/20 1202 05/14/20 1618 05/14/20 2043 05/15/20 0809 05/15/20 1127  GLUCAP 197* 240* 193* 154* 183*    Time spent: 35 minutes  Signed:  Val Riles  [facility]  05/15/2020 12:01 PM

## 2020-05-18 ENCOUNTER — Telehealth: Payer: Self-pay

## 2020-05-18 NOTE — Telephone Encounter (Signed)
Transition Care Management Unsuccessful Follow-up Telephone Call  Date of discharge and from where:  05/15/20 from [facility]  Attempts:  1st Attempt  Reason for unsuccessful TCM follow-up call:  Unable to reach patient  Will follow.

## 2020-05-19 NOTE — Telephone Encounter (Signed)
Transition Care Management Follow-up Telephone Call  Date of discharge and from where: 05/15/20 from [facility]  How have you been since you were released from the hospital? Patient states, "My blood pressure has been running higher since the hospital told me to hold the ramipril. Last 3 daily readings since discharge 194/93, 189/85, 197/84." Taken while on phone with nurse is 195/78 p62. Denies headache, dizziness, blurred vision, pain and all other symptoms. Appetite okay. Drinking good water intake. BM daily since discharge. No longer black in color as of Saturday. FBS 192. Any questions or concerns? Yes , when can I start more BP medication? Currently taking amlodipine 5mg  daily and Coreg 12.5mg  BID, as directed.  Items Reviewed:  Did the pt receive and understand the discharge instructions provided? Yes   Medications obtained and verified? Yes   Other? No   Any new allergies since your discharge? No   Dietary orders reviewed? Low sodium heart healthy  Do you have support at home? Yes   Home Care and Equipment/Supplies: Were home health services ordered? No Were any new equipment or medical supplies ordered?  No  Functional Questionnaire: (I = Independent and D = Dependent) ADLs: I  Bathing/Dressing- I  Meal Prep- I  Eating- I  Maintaining continence- I  Transferring/Ambulation- I  Managing Meds- I  Follow up appointments reviewed:   Hospital f/u appt confirmed? Yes  Scheduled to see Dr. [name] on 05/20/20 @ 10:00.   [facility] f/u appt confirmed? Yes  Scheduled to see [facility], Dr. Bonna Gains on 05/26/20 @ 3:00. Follow up w/ increased LFT's.  Are transportation arrangements needed? No   If their condition worsens, is the pt aware to call PCP or go to the Emergency Dept.? Yes  Was the patient provided with contact information for the PCP's office or ED? Yes  Was to pt encouraged to call back with questions or concerns? Yes

## 2020-05-20 ENCOUNTER — Telehealth (INDEPENDENT_AMBULATORY_CARE_PROVIDER_SITE_OTHER): Payer: Medicare Other | Admitting: Family Medicine

## 2020-05-20 ENCOUNTER — Other Ambulatory Visit: Payer: Self-pay

## 2020-05-20 DIAGNOSIS — K921 Melena: Secondary | ICD-10-CM | POA: Diagnosis not present

## 2020-05-20 DIAGNOSIS — I10 Essential (primary) hypertension: Secondary | ICD-10-CM | POA: Diagnosis not present

## 2020-05-20 DIAGNOSIS — K8050 Calculus of bile duct without cholangitis or cholecystitis without obstruction: Secondary | ICD-10-CM | POA: Diagnosis not present

## 2020-05-20 MED ORDER — AMLODIPINE BESYLATE 10 MG PO TABS: 10.0000 mg | ORAL_TABLET | Freq: Every day | ORAL | 1 refills | Status: DC

## 2020-05-20 NOTE — Assessment & Plan Note (Signed)
Resolved.  Likely as a complication of his ERCP.  He will monitor for recurrence.

## 2020-05-20 NOTE — Assessment & Plan Note (Signed)
Blood pressure has been significantly elevated.  It is difficult to know if his blood pressures are accurate at home though they were elevated in the hospital.  We will increase his amlodipine to 10 mg once daily.  He will come into the office for nurse BP check in 1 week.  He will bring his blood pressure cuff to compare.  He will continue carvedilol 12.5 mg twice daily.

## 2020-05-20 NOTE — Assessment & Plan Note (Signed)
Patient is doing relatively well at this time.  He did have melena as a likely complication from his first ERCP.  He will monitor for recurrence of that.  He will follow up with GI.  They can plan on doing his follow-up liver enzymes once he sees them.

## 2020-05-20 NOTE — Progress Notes (Signed)
Virtual Visit via telephone Note  This visit type was conducted due to national recommendations for restrictions regarding the COVID-19 pandemic (e.g. social distancing).  This format is felt to be most appropriate for this patient at this time.  All issues noted in this document were discussed and addressed.  No physical exam was performed (except for noted visual exam findings with Video Visits).   I connected with [name] [name] today at 10:00 AM EDT by telephone and verified that I am speaking with the correct person using two identifiers. Location patient: home Location provider: home office Persons participating in the virtual visit: patient, provider  I discussed the limitations, risks, security and privacy concerns of performing an evaluation and management service by telephone and the availability of in person appointments. I also discussed with the patient that there may be a patient responsible charge related to this service. The patient expressed understanding and agreed to proceed.  Interactive audio and video telecommunications were attempted between this provider and patient, however failed, due to patient having technical difficulties OR patient did not have access to video capability.  We continued and completed visit with audio only.   Reason for visit: hospital follow-up  HPI: Melena/choledocholithiasis: Patient has been hospitalized twice recently.  Initially had ERCP for balloon extraction of choledocholithiasis.  He was discharged home and subsequently developed melena 2 days after discharge.  He also had itching and jaundice.  He was readmitted and underwent ERCP which found no cause for his bleeding.  His liver enzymes were quite elevated with elevated bilirubin as well.  He had a liver biopsy which is still in process.  He notes no right upper quadrant pain though has had some chronic ongoing issues with tenderness in the right upper quadrant.  He continues to have  jaundice.  He continues to have itching which responds to hydroxyzine.  No bright red blood per rectum.  No melena.  He follows up with GI on Tuesday.  Hypertension: Patient is currently on amlodipine 5 mg once daily and carvedilol 12.5 mg twice daily.  Notes his blood pressure was elevated in the hospital.  Has been elevated since discharge as well.  Notes his blood pressure this morning was 203/94.  He is unsure how accurate his blood pressure cuff is.  He notes no chest pain.  He has chronic dyspnea on exertion which has been stable.  No significant edema.  He notes they stopped his ramipril in the hospital.   ROS: See pertinent positives and negatives per HPI.  Past Medical History:  Diagnosis Date  . BPH (benign prostatic hyperplasia)   . Cancer ([place])    skin  . Cataract   . Coronary artery disease    a. 4v CABG 01/2005; b. cath 06/05/13 showed patent grafts: LIMA-LAD, VG-D, VG-OM3, VG-dRCA, nl filling pressures, nl LV fxn  . Diastolic dysfunction    a. echo 06/6268: nl systolic fxn, mild LVH, diastolic relaxation abnormality, mildly enlarged LA, mild Ao insufficiency  . Dyspnea    with exertion  . Dysrhythmia   . ED (erectile dysfunction)   . GERD (gastroesophageal reflux disease)   . Hyperlipidemia   . Hypertension   . IDDM (insulin dependent diabetes mellitus) 2000  . Osteoarthritis   . Perirectal fistula   . Pneumonia 12/2016  . Shingles 09/04/2018  . Tubular adenoma of colon 07/2012  . Vertigo     Past Surgical History:  Procedure Laterality Date  . BACK SURGERY    . CARDIAC  CATHETERIZATION  06/05/2013   [facility].   Marland Kitchen CARDIAC CATHETERIZATION N/A 09/24/2015   Procedure: Right Heart Cath and Coronary/Graft Angiography;  Surgeon: Minna Merritts, MD;  Location: [place] [facility];  Service: Cardiovascular;  Laterality: N/A;  . CATARACT EXTRACTION  sept 2013   right  . CATARACT EXTRACTION W/PHACO Left 03/28/2017   Procedure: CATARACT EXTRACTION PHACO AND INTRAOCULAR LENS  PLACEMENT (IOC);  Surgeon: Birder Robson, MD;  Location: [facility];  Service: Ophthalmology;  Laterality: Left;  [place] 00:42.0 AP% 15.0 CDE 6.31 Fluid Pack lot # [id number]  . CHOLECYSTECTOMY  05/15/2011   Procedure: LAPAROSCOPIC CHOLECYSTECTOMY;  Surgeon: Rolm Bookbinder, MD;  Location: [place];  Service: General;  Laterality: N/A;  . COLONOSCOPY W/ POLYPECTOMY    . CORONARY ARTERY BYPASS GRAFT  2007   x 4  . ERCP N/A 05/05/2020   Procedure: ENDOSCOPIC RETROGRADE CHOLANGIOPANCREATOGRAPHY (ERCP);  Surgeon: Lucilla Lame, MD;  [facility] ENDOSCOPY;  Service: Endoscopy;  Laterality: N/A;  . ERCP N/A 05/12/2020   Procedure: ENDOSCOPIC RETROGRADE CHOLANGIOPANCREATOGRAPHY (ERCP);  Surgeon: Lucilla Lame, MD;  Location: [facility] ENDOSCOPY;  Service: Endoscopy;  Laterality: N/A;  . ESOPHAGOGASTRODUODENOSCOPY N/A 05/12/2020   Procedure: ESOPHAGOGASTRODUODENOSCOPY (EGD);  Surgeon: Lucilla Lame, MD;  Location: [facility] ENDOSCOPY;  Service: Endoscopy;  Laterality: N/A;  . EYE SURGERY    . FOOT SURGERY  2012   right foot  . JOINT REPLACEMENT  8/10   Right THR--[name]  . LEFT AND RIGHT HEART CATHETERIZATION WITH CORONARY/GRAFT ANGIOGRAM N/A 06/05/2013   Procedure: LEFT AND RIGHT HEART CATHETERIZATION WITH Beatrix Fetters;  Surgeon: Peter M Martinique, MD;  Location: [facility] CATH LAB;  Service: Cardiovascular;  Laterality: N/A;  . LUMBAR LAMINECTOMY  1989  . Prostate photovaporization  5/16   Dr Budd Palmer  . RIGHT/LEFT HEART CATH AND CORONARY/GRAFT ANGIOGRAPHY N/A 01/05/2018   Procedure: RIGHT/LEFT HEART CATH AND CORONARY/GRAFT ANGIOGRAPHY;  Surgeon: Minna Merritts, MD;  Location: [place] [facility];  Service: Cardiovascular;  Laterality: N/A;    Family History  Problem Relation Age of Onset  . Lung cancer Mother   . Heart disease Father   . Colon cancer Neg Hx   . Breast cancer Neg Hx     SOCIAL HX: Non-smoker   Current Outpatient Medications:  .  aspirin EC 81 MG tablet, Take 81 mg by mouth  every other day., Disp: , Rfl:  .  bisacodyl (DULCOLAX) 5 MG EC tablet, Take 2 tablets (10 mg total) by mouth daily as needed for moderate constipation., Disp: 30 tablet, Rfl: 0 .  carvedilol (COREG) 12.5 MG tablet, TAKE ONE (1) TABLET BY MOUTH TWO TIMES PER DAY, Disp: 180 tablet, Rfl: 0 .  cholestyramine (QUESTRAN) 4 g packet, Take 1 packet (4 g total) by mouth 2 (two) times daily for 14 days., Disp: 28 packet, Rfl: 0 .  ezetimibe (ZETIA) 10 MG tablet, Take 1 tablet (10 mg total) by mouth daily., Disp: 90 tablet, Rfl: 3 .  FARXIGA 10 MG TABS tablet, Take 10 mg by mouth every morning., Disp: , Rfl:  .  finasteride (PROSCAR) 5 MG tablet, Take 1 tablet (5 mg total) by mouth daily., Disp: 90 tablet, Rfl: 1 .  FLUZONE HIGH-DOSE QUADRIVALENT 0.7 ML SUSY, , Disp: , Rfl:  .  gabapentin (NEURONTIN) 100 MG capsule, Take 100 mg by mouth 2 (two) times daily., Disp: , Rfl:  .  hydrOXYzine (ATARAX/VISTARIL) 10 MG tablet, Take 1 tablet (10 mg total) by mouth 3 (three) times daily as needed for itching.,  Disp: 30 tablet, Rfl: 0 .  omeprazole (PRILOSEC) 20 MG capsule, TAKE 1 CAPSULE BY MOUTH TWICE DAILY, Disp: 60 capsule, Rfl: 1 .  ondansetron (ZOFRAN ODT) 4 MG disintegrating tablet, Take 1 tablet (4 mg total) by mouth every 8 (eight) hours as needed for nausea or vomiting., Disp: 20 tablet, Rfl: 0 .  polyethylene glycol (MIRALAX / GLYCOLAX) 17 g packet, Take 17 g by mouth daily., Disp: 14 each, Rfl: 0 .  TOUJEO SOLOSTAR 300 UNIT/ML Solostar Pen, INJECT 50 UNITS DAILY MAY INCREASE 2U EVERY 3 DAYS UNTIL FBG IS AT GOAL UP TO 80 UNITS DAILY, Disp: 22.5 mL, Rfl: 6 .  Vitamin D, Cholecalciferol, 1000 units TABS, Take 1,000 Units by mouth daily. , Disp: , Rfl:  .  zinc gluconate 50 MG tablet, Take by mouth., Disp: , Rfl:  .  zolpidem (AMBIEN) 5 MG tablet, Take 1 tablet (5 mg total) by mouth at bedtime as needed for sleep. Stop trazadone not helpful, Disp: 30 tablet, Rfl: 0 .  amLODipine (NORVASC) 10 MG tablet, Take 1  tablet (10 mg total) by mouth daily., Disp: 90 tablet, Rfl: 1  EXAM: Using the telephone visit and thus no physical exam was completed.  ASSESSMENT AND PLAN:  Discussed the following assessment and plan:  Problem List Items Addressed This Visit    Choledocholithiasis    Patient is doing relatively well at this time.  He did have melena as a likely complication from his first ERCP.  He will monitor for recurrence of that.  He will follow up with GI.  They can plan on doing his follow-up liver enzymes once he sees them.      Essential hypertension    Blood pressure has been significantly elevated.  It is difficult to know if his blood pressures are accurate at home though they were elevated in the hospital.  We will increase his amlodipine to 10 mg once daily.  He will come into the office for nurse BP check in 1 week.  He will bring his blood pressure cuff to compare.  He will continue carvedilol 12.5 mg twice daily.      Relevant Medications   amLODipine (NORVASC) 10 MG tablet   Melena    Resolved.  Likely as a complication of his ERCP.  He will monitor for recurrence.          I discussed the assessment and treatment plan with the patient. The patient was provided an opportunity to ask questions and all were answered. The patient agreed with the plan and demonstrated an understanding of the instructions.   The patient was advised to call back or seek an in-person evaluation if the symptoms worsen or if the condition fails to improve as anticipated.  I provided 12 minutes of non-face-to-face time during this encounter.   [name], MD

## 2020-05-20 NOTE — Telephone Encounter (Signed)
Reviewed

## 2020-05-21 ENCOUNTER — Inpatient Hospital Stay: Payer: Medicare Other | Admitting: Internal Medicine

## 2020-05-22 LAB — SURGICAL PATHOLOGY

## 2020-05-26 ENCOUNTER — Ambulatory Visit: Payer: Medicare Other | Admitting: Gastroenterology

## 2020-05-26 ENCOUNTER — Other Ambulatory Visit: Payer: Self-pay

## 2020-05-26 ENCOUNTER — Encounter: Payer: Self-pay | Admitting: Gastroenterology

## 2020-05-26 VITALS — BP 131/66 | HR 65 | Temp 97.3°F | Ht 73.0 in | Wt 214.0 lb

## 2020-05-26 DIAGNOSIS — R748 Abnormal levels of other serum enzymes: Secondary | ICD-10-CM

## 2020-05-26 DIAGNOSIS — L299 Pruritus, unspecified: Secondary | ICD-10-CM | POA: Diagnosis not present

## 2020-05-26 MED ORDER — HYDROXYZINE HCL 10 MG PO TABS: 10.0000 mg | ORAL_TABLET | Freq: Three times a day (TID) | ORAL | 0 refills | Status: DC | PRN

## 2020-05-26 NOTE — Progress Notes (Signed)
[name], MD [address]  [place]  [place], [place] [address]  Main: [phone]  Fax: [phone]   Primary Care Physician: [name], MD   Chief complaint: Elevated liver enzymes  HPI: [name] [name] is a 87 y.o. male here for posthospitalization follow-up for choledocholithiasis.  Patient underwent ERCP with removal of CBD stone and biliary sphincterotomy.  However, had to have repeat ERCP due to persistent elevation in liver enzymes with sclera clear biliary tree on the second procedure.  The etiology of his persistently elevated liver enzymes were not clear and underwent liver biopsy.  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  Energy level is slowly improving.  Does continue have some pruritus.  He states his stool and urine color is back to normal  Current Outpatient Medications  Medication Sig Dispense Refill   amLODipine (NORVASC) 10 MG tablet Take 1 tablet (10 mg total) by mouth daily. 90 tablet 1   aspirin EC 81 MG tablet Take 81 mg by mouth every other day.     bisacodyl (DULCOLAX) 5 MG EC tablet Take 2 tablets (10 mg total) by mouth daily as needed for moderate constipation. 30 tablet 0   carvedilol (COREG) 12.5 MG tablet TAKE ONE (1) TABLET BY MOUTH TWO TIMES PER DAY 180 tablet 0   cholestyramine (QUESTRAN) 4 g packet Take 1 packet (4 g total) by mouth 2 (two) times daily for 14 days. 28 packet 0   ezetimibe (ZETIA) 10 MG tablet Take 1 tablet (10 mg total) by mouth daily. 90 tablet 3   finasteride (PROSCAR) 5 MG tablet Take 1 tablet (5 mg total) by mouth daily. 90 tablet 1   FLUZONE HIGH-DOSE QUADRIVALENT 0.7 ML SUSY      gabapentin (NEURONTIN) 100 MG capsule Take 100 mg by mouth 2 (two) times daily.     omeprazole (PRILOSEC) 20 MG  capsule TAKE 1 CAPSULE BY MOUTH TWICE DAILY 60 capsule 1   polyethylene glycol (MIRALAX / GLYCOLAX) 17 g packet Take 17 g by mouth daily. 14 each 0   TOUJEO SOLOSTAR 300 UNIT/ML Solostar Pen INJECT 50 UNITS DAILY MAY INCREASE 2U EVERY 3 DAYS UNTIL FBG IS AT GOAL UP TO 80 UNITS DAILY 22.5 mL 6   Vitamin D, Cholecalciferol, 1000 units TABS Take 1,000 Units by mouth daily.      hydrOXYzine (ATARAX/VISTARIL) 10 MG tablet Take 1 tablet (10 mg total) by mouth 3 (three) times daily as needed for up to 30 doses for itching. 30 tablet 0   ondansetron (ZOFRAN ODT) 4 MG disintegrating tablet Take 1 tablet (4 mg total) by mouth every 8 (eight) hours as needed for nausea or vomiting. (Patient not taking: Reported on 05/26/2020) 20 tablet 0   zolpidem (AMBIEN) 5 MG tablet Take 1 tablet (5 mg total) by mouth at bedtime as needed for sleep. Stop trazadone not helpful (Patient not taking: Reported on 05/26/2020) 30 tablet 0   No current facility-administered medications for this visit.    Allergies as of 05/26/2020 - Review Complete 05/26/2020  Allergen Reaction Noted   Cortisone Other (See Comments) 06/24/2016   Metformin Diarrhea 09/05/2016   Metformin and related Diarrhea 09/05/2016    ROS:  General: Negative for anorexia, weight loss, fever, chills, fatigue, weakness. ENT: Negative for  hoarseness, difficulty swallowing , nasal congestion. CV: Negative for chest pain, angina, palpitations, dyspnea on exertion, peripheral edema.  Respiratory: Negative for dyspnea at rest, dyspnea on exertion, cough, sputum, wheezing.  GI: See history of present illness. GU:  Negative for dysuria, hematuria, urinary incontinence, urinary frequency, nocturnal urination.  Endo: Negative for unusual weight change.    Physical Examination:   BP 131/66    Pulse 65    Temp (!) 97.3 F (36.3 C) (Oral)    Ht 6\' 1"  (1.854 m)    Wt 214 lb (97.1 kg)    BMI 28.23 kg/m   General: Well-nourished, well-developed in no  acute distress.  Eyes: No icterus. Conjunctivae pink. Mouth: Oropharyngeal mucosa moist and pink , no lesions erythema or exudate. Neck: Supple, Trachea midline Abdomen: Bowel sounds are normal, nontender, nondistended, no hepatosplenomegaly or masses, no abdominal bruits or hernia , no rebound or guarding.   Extremities: No lower extremity edema. No clubbing or deformities. Neuro: Alert and oriented x 3.  Grossly intact. Skin: Warm and dry, no jaundice.   Psych: Alert and cooperative, normal mood and affect.   Labs: CMP     Component Value Date/Time   NA 135 05/15/2020 0536   NA 140 04/27/2020 0930   NA 139 02/22/2013 1654   K 3.8 05/15/2020 0536   K 4.5 02/22/2013 1654   CL 109 05/15/2020 0536   CL 109 (H) 02/22/2013 1654   CO2 20 (L) 05/15/2020 0536   CO2 24 02/22/2013 1654   GLUCOSE 190 (H) 05/15/2020 0536   GLUCOSE 197 (H) 02/22/2013 1654   BUN 14 05/15/2020 0536   BUN 16 04/27/2020 0930   BUN 20 (H) 02/22/2013 1654   CREATININE 1.04 05/15/2020 0536   CREATININE 1.18 02/22/2013 1654   CREATININE 1.13 05/25/2012 1630   CALCIUM 8.3 (L) 05/15/2020 0536   CALCIUM 9.1 02/22/2013 1654   PROT 5.3 (L) 05/15/2020 0536   PROT 6.1 04/27/2020 0930   PROT 6.2 (L) 02/22/2013 1654   ALBUMIN 2.6 (L) 05/15/2020 0536   ALBUMIN 3.5 (L) 04/27/2020 0930   ALBUMIN 3.5 02/22/2013 1654   AST 43 (H) 05/15/2020 0536   AST 20 02/22/2013 1654   ALT 56 (H) 05/15/2020 0536   ALT 42 02/22/2013 1654   ALKPHOS 404 (H) 05/15/2020 0536   ALKPHOS 113 02/22/2013 1654   BILITOT 8.2 (H) 05/15/2020 0536   BILITOT 1.3 (H) 04/27/2020 0930   BILITOT 0.6 02/22/2013 1654   GFRNONAA >60 05/15/2020 0536   GFRNONAA 59 (L) 02/22/2013 1654   GFRAA 53 (L) 04/27/2020 0930   GFRAA >60 02/22/2013 1654   Lab Results  Component Value Date   WBC 5.2 05/15/2020   HGB 11.5 (L) 05/15/2020   HCT 34.1 (L) 05/15/2020   MCV 94.7 05/15/2020   PLT 104 (L) 05/15/2020    Imaging Studies: CT ABDOMEN PELVIS W  CONTRAST  Result Date: 05/11/2020 CLINICAL DATA:  History of prior cholecystectomy and cholelithiasis with recent ERCP with new onset diarrhea and tarry stools EXAM: CT ABDOMEN AND PELVIS WITH CONTRAST TECHNIQUE: Multidetector CT imaging of the abdomen and pelvis was performed using the standard protocol following bolus administration of intravenous contrast. CONTRAST:  164mL OMNIPAQUE IOHEXOL 300 MG/ML  SOLN COMPARISON:  04/08/2020 FINDINGS: Lower chest: No acute abnormality. Hepatobiliary: Mild fatty infiltration of the liver is noted. The gallbladder has been surgically removed. Pancreas: Unremarkable. No pancreatic ductal dilatation or surrounding inflammatory changes. Spleen: Normal in size without focal abnormality. Adrenals/Urinary Tract: Adrenal glands  are within normal limits. Kidneys demonstrate a normal enhancement pattern bilaterally. Renal cysts are noted on the left stable from the prior exam. No calculi or obstructive changes are seen. Ureters are within normal limits. The bladder is partially distended. Stomach/Bowel: The appendix is within normal limits. No obstructive or inflammatory changes of the colon are seen. The stomach and small bowel are within normal limits. Vascular/Lymphatic: Aortic atherosclerosis. No enlarged abdominal or pelvic lymph nodes. Reproductive: Prostate is prominent indenting upon the inferior aspect of the bladder. Other: No abdominal wall hernia or abnormality. No abdominopelvic ascites. Musculoskeletal: Right hip replacement is seen. Degenerative changes of lumbar spine are noted. IMPRESSION: Changes of prior cholecystectomy are noted. No acute abnormality is seen. Stable renal cystic change on the left and prostatic enlargement. Electronically Signed   By: Inez Catalina M.D.   On: 05/11/2020 18:23   MR 3D Recon At Scanner  Result Date: 04/29/2020 CLINICAL DATA:  Right upper quadrant abdominal pain and tenderness to palpation. Elevated liver enzymes. Ultrasound  unrevealing. Remote history of cholecystectomy. EXAM: MRI ABDOMEN WITHOUT AND WITH CONTRAST (INCLUDING MRCP) TECHNIQUE: Multiplanar multisequence MR imaging of the abdomen was performed both before and after the administration of intravenous contrast. Heavily T2-weighted images of the biliary and pancreatic ducts were obtained, and three-dimensional MRCP images were rendered by post processing. CONTRAST:  3mL GADAVIST GADOBUTROL 1 MMOL/ML IV SOLN COMPARISON:  04/08/2020 abdominal sonogram and CT abdomen/pelvis. FINDINGS: Lower chest: No acute abnormality at the lung bases. Hepatobiliary: Normal liver size and configuration. No hepatic steatosis. There is a 0.5 cm segment 5 right liver hemangioma (series 240/image 31). No suspicious liver masses. Cholecystectomy. No significant biliary ductal dilatation. Common bile duct diameter 5 mm. There is a solitary 6 mm stone in the lower third of the common bile duct (series 14/image 13). No additional biliary filling defects. No biliary masses, strictures or beading. Pancreas: No pancreatic mass or duct dilation.  No pancreas divisum. Spleen: Normal size. No mass. Adrenals/Urinary Tract: Normal adrenals. No hydronephrosis. Homogeneous T1 hyperintense 1.3 cm renal cortical lesion in the medial lower right kidney (series 16/image 62) without convincing enhancement on motion degraded subtraction sequences compatible with Bosniak category 2 hemorrhagic/proteinaceous renal cyst. Additional exophytic Bosniak category 2 renal cysts in the lower left kidney with layering hemorrhagic material and no enhancement, largest 2.5 cm anteriorly (series 16/image 66). No suspicious renal masses. Stomach/Bowel: Normal non-distended stomach. Visualized small and large bowel is normal caliber, with no bowel wall thickening. Vascular/Lymphatic: Atherosclerotic nonaneurysmal abdominal aorta. Patent portal, splenic, hepatic and renal veins. No pathologically enlarged lymph nodes in the abdomen.  Other: No abdominal ascites or focal fluid collection. Enlarged prostate incidentally noted. Musculoskeletal: No aggressive appearing focal osseous lesions. IMPRESSION: 1. Choledocholithiasis with solitary 6 mm stone in the lower third of the common bile duct. No significant biliary ductal dilatation. CBD diameter 5 mm. 2. Small Bosniak category 1 and category 2 renal cysts. No suspicious renal masses. These results will be called to the ordering clinician or representative by the Radiology Department at the imaging location. Electronically Signed   By: Ilona Sorrel M.D.   On: 04/29/2020 12:42   US BIOPSY (LIVER)  Result Date: 05/14/2020 INDICATION: Acute hepatitis, jaundice, elevated LFTs EXAM: ULTRASOUND GUIDED CORE BIOPSY OF RIGHT HEPATIC LOBE MEDICATIONS: 1% LIDOCAINE LOCAL ANESTHESIA/SEDATION: Versed 1.0mg  IV; Fentanyl 11mcg IV; Moderate Sedation Time:  8 MINUTES The patient was continuously monitored during the procedure by the interventional radiology nurse under my direct supervision. FLUOROSCOPY TIME:  Fluoroscopy  Time: NONE. COMPLICATIONS: None immediate. PROCEDURE: The procedure, risks, benefits, and alternatives were explained to the patient. Questions regarding the procedure were encouraged and answered. The patient understands and consents to the procedure. Previous imaging reviewed. Preliminary ultrasound performed. Right liver was localized in the mid axillary line through a lower intercostal space. Overlying skin marked. Under sterile conditions and local anesthesia, a 17 gauge coaxial guide was advanced into the right hepatic lobe. Needle position confirmed with ultrasound. Images obtained for documentation. 2 18 gauge core biopsies obtained of the right hepatic lobe randomly. Samples were intact and non fragmented. These were placed in formalin. Needle tract occluded with Gel-Foam. Postprocedure imaging demonstrates no hemorrhage or hematoma. Patient tolerated biopsy well. FINDINGS: Imaging  confirms needle placed in the right hepatic lobe for core biopsy IMPRESSION: Successful ultrasound right liver random core biopsy Electronically Signed   By: Jerilynn Mages.  Shick M.D.   On: 05/14/2020 10:36   DG C-Arm 1-60 Min-No Report  Result Date: 05/12/2020 Fluoroscopy was utilized by the requesting physician.  No radiographic interpretation.   DG C-Arm 1-60 Min-No Report  Result Date: 05/05/2020 Fluoroscopy was utilized by the requesting physician.  No radiographic interpretation.   MR ABDOMEN MRCP W WO CONTAST  Result Date: 04/29/2020 CLINICAL DATA:  Right upper quadrant abdominal pain and tenderness to palpation. Elevated liver enzymes. Ultrasound unrevealing. Remote history of cholecystectomy. EXAM: MRI ABDOMEN WITHOUT AND WITH CONTRAST (INCLUDING MRCP) TECHNIQUE: Multiplanar multisequence MR imaging of the abdomen was performed both before and after the administration of intravenous contrast. Heavily T2-weighted images of the biliary and pancreatic ducts were obtained, and three-dimensional MRCP images were rendered by post processing. CONTRAST:  36mL GADAVIST GADOBUTROL 1 MMOL/ML IV SOLN COMPARISON:  04/08/2020 abdominal sonogram and CT abdomen/pelvis. FINDINGS: Lower chest: No acute abnormality at the lung bases. Hepatobiliary: Normal liver size and configuration. No hepatic steatosis. There is a 0.5 cm segment 5 right liver hemangioma (series 240/image 31). No suspicious liver masses. Cholecystectomy. No significant biliary ductal dilatation. Common bile duct diameter 5 mm. There is a solitary 6 mm stone in the lower third of the common bile duct (series 14/image 13). No additional biliary filling defects. No biliary masses, strictures or beading. Pancreas: No pancreatic mass or duct dilation.  No pancreas divisum. Spleen: Normal size. No mass. Adrenals/Urinary Tract: Normal adrenals. No hydronephrosis. Homogeneous T1 hyperintense 1.3 cm renal cortical lesion in the medial lower right kidney (series  16/image 62) without convincing enhancement on motion degraded subtraction sequences compatible with Bosniak category 2 hemorrhagic/proteinaceous renal cyst. Additional exophytic Bosniak category 2 renal cysts in the lower left kidney with layering hemorrhagic material and no enhancement, largest 2.5 cm anteriorly (series 16/image 66). No suspicious renal masses. Stomach/Bowel: Normal non-distended stomach. Visualized small and large bowel is normal caliber, with no bowel wall thickening. Vascular/Lymphatic: Atherosclerotic nonaneurysmal abdominal aorta. Patent portal, splenic, hepatic and renal veins. No pathologically enlarged lymph nodes in the abdomen. Other: No abdominal ascites or focal fluid collection. Enlarged prostate incidentally noted. Musculoskeletal: No aggressive appearing focal osseous lesions. IMPRESSION: 1. Choledocholithiasis with solitary 6 mm stone in the lower third of the common bile duct. No significant biliary ductal dilatation. CBD diameter 5 mm. 2. Small Bosniak category 1 and category 2 renal cysts. No suspicious renal masses. These results will be called to the ordering clinician or representative by the Radiology Department at the imaging location. Electronically Signed   By: Ilona Sorrel M.D.   On: 04/29/2020 12:42    Assessment and Plan:  [name] [name] is a 87 y.o. y/o male here for follow-up of choledocholithiasis and elevated liver enzymes  Blood work including viral and autoimmune hepatitis work-up has been negative  Liver biopsy results reviewed with pathologist, Dr. Delrae Sawyers who states the pathology was reviewed at [facility] and as per their assessment, findings were most consistent with changes from CBD obstruction rather than a chronic process  Patient's improvement in clinical symptoms such as improved fatigue, less jaundice, normalization of urine and stool color is most consistent with likely resolving clinical picture of transient biliary obstruction  We will  repeat labs at this time  For his pruritus, patient was prescribed Questran twice daily but patient has not started taking this and rather has been taking hydroxyzine 3 times a day which was supposed to be a as needed dosing.  He is asking for refill of the hydroxyzine but does note that it does make him drowsy.  I have encouraged him to start taking Questran twice daily and only use hydroxyzine as needed and stop it if his drowsiness gets worse or he starts getting mental status changes  Dr [name]

## 2020-05-27 LAB — HEPATIC FUNCTION PANEL
ALT: 46 IU/L — ABNORMAL HIGH (ref 0–44)
AST: 42 IU/L — ABNORMAL HIGH (ref 0–40)
Albumin: 3.4 g/dL — ABNORMAL LOW (ref 3.6–4.6)
Alkaline Phosphatase: 450 IU/L — ABNORMAL HIGH (ref 44–121)
Bilirubin Total: 2.3 mg/dL — ABNORMAL HIGH (ref 0.0–1.2)
Bilirubin, Direct: 1.69 mg/dL — ABNORMAL HIGH (ref 0.00–0.40)
Total Protein: 6 g/dL (ref 6.0–8.5)

## 2020-05-28 ENCOUNTER — Other Ambulatory Visit: Payer: Self-pay | Admitting: Family Medicine

## 2020-06-17 ENCOUNTER — Telehealth: Payer: Self-pay

## 2020-06-17 ENCOUNTER — Telehealth: Payer: Self-pay | Admitting: Gastroenterology

## 2020-06-17 DIAGNOSIS — R748 Abnormal levels of other serum enzymes: Secondary | ICD-10-CM

## 2020-06-17 NOTE — Telephone Encounter (Signed)
-----   Message from Virgel Manifold, MD sent at 06/11/2020 11:56 AM EDT ----- Herb Grays please obtain hepatic function panel testing to be done in 2 weeks, for elevated liver enzymes

## 2020-06-17 NOTE — Telephone Encounter (Signed)
Called patient to let him know that his lab results came back and that Dr. Bonna Gains would like for him to get his Hepatic Function Panel to be drawn again next week. Patient agreed and stated that he would come in next Wednesday to have it drawn.  Lab requisition will be left at the lab.

## 2020-06-17 NOTE — Telephone Encounter (Signed)
Patient called LVM asking for call back to answer a few questions per patient.

## 2020-06-18 DIAGNOSIS — E119 Type 2 diabetes mellitus without complications: Secondary | ICD-10-CM | POA: Diagnosis not present

## 2020-06-18 LAB — HM DIABETES EYE EXAM

## 2020-06-19 ENCOUNTER — Other Ambulatory Visit: Payer: Self-pay | Admitting: Family Medicine

## 2020-06-19 DIAGNOSIS — E118 Type 2 diabetes mellitus with unspecified complications: Secondary | ICD-10-CM

## 2020-06-22 ENCOUNTER — Ambulatory Visit: Payer: Medicare Other | Admitting: Gastroenterology

## 2020-06-23 ENCOUNTER — Telehealth: Payer: Self-pay | Admitting: Cardiovascular Disease

## 2020-06-23 NOTE — Telephone Encounter (Signed)
Patient calling in with recent BP's  AM before he starts his day 4/19 174/87  72 4/18 169/82  59 4/17 165/74  69 4/16 185/74  63 4/15 184/83  55    Patient was recently in hospital where some of his medications were stopped or changed. Patient states his amlodipine was changed from 1 tablet daily 5 mg which was now changed to 10 mg in the hospital. Patient also states he is questioning  his Zetia, rosuvastatin and ramipril

## 2020-06-23 NOTE — Telephone Encounter (Signed)
Was able to reach pt regarding his recent BP readings this week 4/19     174/87        HR 72 4/18     169/82        HR 59 4/17     165/74        HR 69 4/16     185/74        HR 63 4/15     184/83        HR 55  Pt reports this is usually beofre his BP meds have had time to "kick in". Denies CP, dizziness, or shob, however he is concern since he was in the hospital in March and has had several medication changes since then and wonders if that is playing a factor with his current HTN. Would like to be seen to discuss meds.   Appt made for tomorrow 4/20 with Ignacia Bayley, NP.  Pt very delightful for the phone call and will come tomorrow "with my smiling face on".

## 2020-06-24 ENCOUNTER — Other Ambulatory Visit: Payer: Self-pay

## 2020-06-24 ENCOUNTER — Ambulatory Visit: Payer: Medicare Other | Admitting: Nurse Practitioner

## 2020-06-24 ENCOUNTER — Encounter: Payer: Self-pay | Admitting: Nurse Practitioner

## 2020-06-24 VITALS — BP 150/60 | HR 69 | Ht 73.0 in | Wt 217.1 lb

## 2020-06-24 DIAGNOSIS — E785 Hyperlipidemia, unspecified: Secondary | ICD-10-CM | POA: Diagnosis not present

## 2020-06-24 DIAGNOSIS — I2721 Secondary pulmonary arterial hypertension: Secondary | ICD-10-CM

## 2020-06-24 DIAGNOSIS — I25118 Atherosclerotic heart disease of native coronary artery with other forms of angina pectoris: Secondary | ICD-10-CM | POA: Diagnosis not present

## 2020-06-24 DIAGNOSIS — I10 Essential (primary) hypertension: Secondary | ICD-10-CM | POA: Diagnosis not present

## 2020-06-24 DIAGNOSIS — R748 Abnormal levels of other serum enzymes: Secondary | ICD-10-CM | POA: Diagnosis not present

## 2020-06-24 DIAGNOSIS — N183 Chronic kidney disease, stage 3 unspecified: Secondary | ICD-10-CM | POA: Diagnosis not present

## 2020-06-24 DIAGNOSIS — I5032 Chronic diastolic (congestive) heart failure: Secondary | ICD-10-CM | POA: Diagnosis not present

## 2020-06-24 MED ORDER — RAMIPRIL 10 MG PO CAPS: 10.0000 mg | ORAL_CAPSULE | Freq: Every day | ORAL | 3 refills | Status: DC

## 2020-06-24 MED ORDER — EZETIMIBE 10 MG PO TABS
10.0000 mg | ORAL_TABLET | Freq: Every day | ORAL | 3 refills | Status: DC
Start: 2020-06-24 — End: 2020-09-02

## 2020-06-24 NOTE — Patient Instructions (Signed)
Medication Instructions:  Your physician has recommended you make the following change in your medication:   1. START Ramipril 10 mg once a day  *If you need a refill on your cardiac medications before your next appointment, please call your pharmacy*   Lab Work: BMET in one week at the [facility]. No appointment is needed for this.  If you have labs (blood work) drawn today and your tests are completely normal, you will receive your results only by: Marland Kitchen MyChart Message (if you have MyChart) OR . A paper copy in the mail If you have any lab test that is abnormal or we need to change your treatment, we will call you to review the results.   Testing/Procedures: None   Follow-Up: At [facility], you and your health needs are our priority.  As part of our continuing mission to provide you with exceptional heart care, we have created designated Provider Care Teams.  These Care Teams include your primary Cardiologist (physician) and Advanced Practice Providers (APPs -  Physician Assistants and Nurse Practitioners) who all work together to provide you with the care you need, when you need it.   Your next appointment:   1 month(s)  The format for your next appointment:   In Person  Provider:   Ida Rogue, MD or [name], NP

## 2020-06-24 NOTE — Progress Notes (Signed)
Office Visit    Patient Name: [name] [name] Date of Encounter: 06/24/2020  Primary Care Provider:  [name], MD Primary Cardiologist:  Ida Rogue, MD  Chief Complaint    87 y/o ? w/ a h/o CAD s/p CABG x 4 in 2009 (2/4 patent grafts on cath 01/2018), HFpEF, HTN, HL, PAH, IDDM, CKD III, and BPH, who presents for f/u related to HTN.  Past Medical History    Past Medical History:  Diagnosis Date  . (HFpEF) heart failure with preserved ejection fraction ([place])    a. 05/4740 Echo: nl systolic fxn, mild LVH, diastolic relaxation abnormality, mildly enlarged LA, mild Ao insufficiency; 01/2018 Echo: EF 55-60%, no rwma, GR2 DD, triv AI, mildly dil LA, mild inc PASP.  Marland Kitchen BPH (benign prostatic hyperplasia)   . Cancer ([place])    skin  . Cataract   . Choledocholithiasis    a. 05/2020 ERCP w/ CBD stone removal and biliary sphincterotomy.  . Coronary artery disease    a. 01/2005 s/p CABG x 4: LIMA-LAD, VG-Diag, VG-OM3, VG-dRCA; b. 06/2013 Cath: patent grafts; c. 09/2015 Cath: VG->Diag 100, other grafts patent. LAD 100, RCA sev dzs->Med rx; d. 01/2018 Cath: LAD 100ost/p, LCX 50, RCA 100p/d, VG->OM3 100, VG->Diag 100, LIMA->LAD ok, VG->RPAV 41m-->Med rx.  Marland Kitchen Dyspnea    with exertion  . Dysrhythmia   . ED (erectile dysfunction)   . Elevated LFTs    a. 05/2020 following CBD stone. 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  Marland Kitchen GERD (gastroesophageal reflux disease)   . Hyperlipidemia   . Hypertension   . IDDM (insulin dependent diabetes mellitus) 2000  . Osteoarthritis   . PAH (pulmonary artery hypertension) ([place])    a. 01/2018 RHC: PA 51/22 (32).  . Perirectal fistula   . Pneumonia 12/2016  . Shingles 09/04/2018  . Tubular adenoma of colon 07/2012  . Vertigo    Past Surgical History:  Procedure Laterality Date  . BACK SURGERY    . CARDIAC CATHETERIZATION  06/05/2013   [facility].   Marland Kitchen CARDIAC CATHETERIZATION N/A 09/24/2015   Procedure:  Right Heart Cath and Coronary/Graft Angiography;  Surgeon: Minna Merritts, MD;  Location: [facility];  Service: Cardiovascular;  Laterality: N/A;  . CATARACT EXTRACTION  sept 2013   right  . CATARACT EXTRACTION W/PHACO Left 03/28/2017   Procedure: CATARACT EXTRACTION PHACO AND INTRAOCULAR LENS PLACEMENT (IOC);  Surgeon: Birder Robson, MD;  Location: [facility];  Service: Ophthalmology;  Laterality: Left;  [place] 00:42.0 AP% 15.0 CDE 6.31 Fluid Pack lot # [id number]  . CHOLECYSTECTOMY  05/15/2011   Procedure: LAPAROSCOPIC CHOLECYSTECTOMY;  Surgeon: Rolm Bookbinder, MD;  Location: [facility];  Service: General;  Laterality: N/A;  . COLONOSCOPY W/ POLYPECTOMY    . CORONARY ARTERY BYPASS GRAFT  2007   x 4  . ERCP N/A 05/05/2020   Procedure: ENDOSCOPIC RETROGRADE CHOLANGIOPANCREATOGRAPHY (ERCP);  Surgeon: Lucilla Lame, MD;  Location: [facility] ENDOSCOPY;  Service: Endoscopy;  Laterality: N/A;  . ERCP N/A 05/12/2020   Procedure: ENDOSCOPIC RETROGRADE CHOLANGIOPANCREATOGRAPHY (ERCP);  Surgeon: Lucilla Lame, MD;  Location: [facility];  Service: Endoscopy;  Laterality: N/A;  . ESOPHAGOGASTRODUODENOSCOPY N/A 05/12/2020   Procedure: ESOPHAGOGASTRODUODENOSCOPY (EGD);  Surgeon: Lucilla Lame, MD;  Location: [facility] ENDOSCOPY;  Service: Endoscopy;  Laterality: N/A;  . EYE SURGERY    . FOOT SURGERY  2012   right foot  . JOINT REPLACEMENT  8/10   Right THR--[name]  . LEFT AND RIGHT  HEART CATHETERIZATION WITH CORONARY/GRAFT ANGIOGRAM N/A 06/05/2013   Procedure: LEFT AND RIGHT HEART CATHETERIZATION WITH Beatrix Fetters;  Surgeon: Peter M Martinique, MD;  Location: [facility] CATH LAB;  Service: Cardiovascular;  Laterality: N/A;  . LUMBAR LAMINECTOMY  1989  . Prostate photovaporization  5/16   Dr Budd Palmer  . RIGHT/LEFT HEART CATH AND CORONARY/GRAFT ANGIOGRAPHY N/A 01/05/2018   Procedure: RIGHT/LEFT HEART CATH AND CORONARY/GRAFT ANGIOGRAPHY;  Surgeon: Minna Merritts, MD;  Location: [facility];   Service: Cardiovascular;  Laterality: N/A;    Allergies  Allergies  Allergen Reactions  . Cortisone Other (See Comments)    Reaction: Leg Cramps  . Metformin Diarrhea  . Metformin And Related Diarrhea    History of Present Illness    87 y/o ? w/ the above complex PMH including CAD, HTN, HL, IDDM, HFpEF, PAH, CKD III, and BPH.  He underwent CABG x 4 in 2009 w/ subsequent caths in 06/2013 (patent grafts), 09/2015 (3/4 patent grafts), and 01/2018 (2/4 patent grafts).  He has been medically managed.  Most recent echo in 01/2018 showed an EF 55-60%, no rwma, GR2 DD, triv AI, mildly dil LA, mild inc PASP.  He was hospitalized in Feb 2022 w/ n/v, AKI, and elevated LFTs.  Creatinine up to 1.4 in the setting of volume depletion and home doses of Lasix and ramipril were discontinued.  He underwent ERCP w/ biliary sphincterectomy and balloon extraction of choledocholithiasis on 3/1.  He was readmitted 3/11 w/ GIB and ongoing LFT elevations w/ jaundice.  ERCP showed small hiatal hernia, normal stomach,normal duodenum, and patent prior biliary sphincterotomy.  The biliary tree was swept and nothing was found.  Liver biopsy was performed and has since returned showing changes consistent with common bile duct obstruction and not a chronic process.  Patient has since been seen by GI in the outpatient setting and as of 3/22, LFTs remain mildly elevated but continue to improve.  Patient contacted our office on April 19 secondary to elevated blood pressures at home.  Pressures have been trending in the 150s to 170s at home.  He has chronic, stable dyspnea on exertion without chest pain.  He has been riding his bicycle 3-4 times per week for about 35 minutes without any change in activity tolerance.  Off of Lasix, his weight has been stable and he has not been experiencing any lower extremity swelling denies palpitations, PND, orthopnea, dizziness, syncope, or early satiety.  Home Medications    Prior to Admission  medications   Medication Sig Start Date End Date Taking? Authorizing Provider  amLODipine (NORVASC) 10 MG tablet Take 1 tablet (10 mg total) by mouth daily. 05/20/20   [name], MD  aspirin EC 81 MG tablet Take 81 mg by mouth every other day.    [provider]  bisacodyl (DULCOLAX) 5 MG EC tablet Take 2 tablets (10 mg total) by mouth daily as needed for moderate constipation. 05/15/20   Val Riles, MD  carvedilol (COREG) 12.5 MG tablet TAKE ONE (1) TABLET BY MOUTH TWO TIMES PER DAY 04/01/20   Minna Merritts, MD  cholestyramine (QUESTRAN) 4 g packet Take 1 packet (4 g total) by mouth 2 (two) times daily for 14 days. 05/15/20 05/29/20  Val Riles, MD  ezetimibe (ZETIA) 10 MG tablet Take 1 tablet (10 mg total) by mouth daily. 04/10/20 07/09/20  Nolberto Hanlon, MD  finasteride (PROSCAR) 5 MG tablet Take 1 tablet (5 mg total) by mouth daily. 03/11/20   [name]  G, MD  FLUZONE HIGH-DOSE QUADRIVALENT 0.7 ML SUSY  01/01/20   [provider]  gabapentin (NEURONTIN) 100 MG capsule TAKE 1 CAPSULE BY MOUTH 3 TIMES DAILY 05/28/20   [name], MD  hydrOXYzine (ATARAX/VISTARIL) 10 MG tablet Take 1 tablet (10 mg total) by mouth 3 (three) times daily as needed for up to 30 doses for itching. 05/26/20   Virgel Manifold, MD  omeprazole (PRILOSEC) 20 MG capsule TAKE 1 CAPSULE BY MOUTH TWICE DAILY 04/24/20   [name], MD  ondansetron (ZOFRAN ODT) 4 MG disintegrating tablet Take 1 tablet (4 mg total) by mouth every 8 (eight) hours as needed for nausea or vomiting. Patient not taking: Reported on 05/26/2020 04/06/20   Crecencio Mc, MD  polyethylene glycol (MIRALAX / GLYCOLAX) 17 g packet Take 17 g by mouth daily. 05/16/20   Val Riles, MD  TOUJEO SOLOSTAR 300 UNIT/ML Solostar Pen INJECT 50 UNITS DAILY MAY INCREASE 2U EVERY 3 DAYS UNTIL FBG IS AT GOAL UP TO 80 UNITS DAILY 06/22/20   [name], MD  Vitamin D, Cholecalciferol, 1000 units TABS Take 1,000 Units  by mouth daily.     [provider]  zolpidem (AMBIEN) 5 MG tablet Take 1 tablet (5 mg total) by mouth at bedtime as needed for sleep. Stop trazadone not helpful Patient not taking: Reported on 05/26/2020 02/26/19   [name], MD    Review of Systems    Chronic, stable dyspnea on exertion.  He denies chest pain, palpitations, PND, orthopnea, dizziness, syncope, edema, or early satiety.  All other systems reviewed and are otherwise negative except as noted above.  Physical Exam    VS:  BP (!) 150/60 (BP Location: Left Arm, Patient Position: Sitting, Cuff Size: Normal)   Pulse 69   Ht 6\' 1"  (1.854 m)   Wt 217 lb 2 oz (98.5 kg)   SpO2 99%   BMI 28.65 kg/m  , BMI Body mass index is 28.65 kg/m. GEN: Well nourished, well developed, in no acute distress. HEENT: normal. Neck: Supple, no JVD, carotid bruits, or masses. Cardiac: RRR, 2/6 systolic ejection murmur at the upper sternal borders, no rubs, or gallops. No clubbing, cyanosis, edema.  Radials/PT 1+ and equal bilaterally.  Respiratory:  Respirations regular and unlabored, clear to auscultation bilaterally. GI: Soft, nontender, nondistended, BS + x 4. MS: no deformity or atrophy. Skin: warm and dry, no rash. Neuro:  Strength and sensation are intact. Psych: Normal affect.  Accessory Clinical Findings     Lab Results  Component Value Date   WBC 5.2 05/15/2020   HGB 11.5 (L) 05/15/2020   HCT 34.1 (L) 05/15/2020   MCV 94.7 05/15/2020   PLT 104 (L) 05/15/2020   Lab Results  Component Value Date   CREATININE 1.04 05/15/2020   BUN 14 05/15/2020   NA 135 05/15/2020   K 3.8 05/15/2020   CL 109 05/15/2020   CO2 20 (L) 05/15/2020   Lab Results  Component Value Date   ALT 46 (H) 05/26/2020   AST 42 (H) 05/26/2020   GGT 203 (H) 04/09/2020   ALKPHOS 450 (H) 05/26/2020   BILITOT 2.3 (H) 05/26/2020   Lab Results  Component Value Date   CHOL 93 03/11/2020   HDL 37.60 (L) 03/11/2020   LDLCALC 23 03/11/2020    LDLDIRECT 65.0 06/02/2016   TRIG 163.0 (H) 03/11/2020   CHOLHDL 2 03/11/2020    Lab Results  Component Value Date   HGBA1C 7.8 (  H) 04/08/2020    Assessment & Plan    1.  Essential hypertension: Patient previously on ramipril 10 mg twice daily in addition to Lasix 20 mg every other day but both were held following hospitalization in February of this year, at which time he presented with nausea, vomiting, volume depletion, and acute kidney injury with creatinine up to 1.4.  Creatinine has since normalized and was 1.04 March 11.  He is also on carvedilol and amlodipine.  His blood pressures at home have been trending in the 150s to 170s and he is concerned.  He has chronic, stable dyspnea on exertion but does not experience chest pain.  We agreed to resume ramipril 10 mg daily today with plan for follow-up basic metabolic panel next week.  If stable, and if pressures continue to trend in an elevated fashion (he will call or submit through MyChart), we can plan to further titrate back to previous dose of 10 mg twice daily.  Regarding Lasix, he has not been experiencing any significant lower extremity swelling or worsening of dyspnea on exertion.  He will use this on a as needed basis.  2.  Coronary artery disease: Status post CABG x4 in 2009 with most recent catheterization in 2019 revealing 2/4 patent grafts and severe native vessel disease.  He has chronic, stable dyspnea on exertion dating back to at least his last catheterization and does not experience chest pain.  He is able to ride his bicycle 35 minutes 3-4 times per week without significant limitations.  He does experience dyspnea with household chores however.  He remains on aspirin, beta-blocker, and calcium channel blocker therapy.  Statin is currently on hold in the setting of elevated LFTs.  3.  Chronic heart failure with preserved ejection fraction/pulmonary arterial hypertension: EF 55 to 60% with grade 2 diastolic dysfunction noted on  echo November 2019.  Right heart cath at that time showed a PA of 51/22 (32).  As above, he has chronic dyspnea on exertion which she notes is only progressed slightly.  He is euvolemic on examination and his weight is relatively stable over time.  Blood pressure is elevated and I am adding back ramipril 10 mg daily today with likely plan to titrate to twice daily dosing (prior home dose) pending response and labs next week.  He has Lasix 20 mg at home which has been on hold since February.  He uses on an as-needed basis for weight gain or lower extremity swelling.  Consider spironolactone and empagliflozin in the future.  4.  Hyperlipidemia: LDL of 23 earlier this year.  Rosuvastatin is on hold in the setting of elevated LFTs following recent common bile duct obstruction.  Defer resumption of statin therapy until cleared by GI.  He is otherwise on Zetia.  5.  Stage III chronic kidney disease: Resuming Xarelto today.  Follow-up labs next week.  6.  Diabetes mellitus: On insulin therapy with an A1c of 7.8 in February.  Consider addition of empagliflozin given HFpEF.  7.  Disposition: Follow-up basic metabolic panel in 1 week.  Follow-up in clinic in 1 month or sooner if necessary.  [name], NP 06/24/2020, 10:52 AM

## 2020-06-25 LAB — HEPATIC FUNCTION PANEL
ALT: 28 IU/L (ref 0–44)
AST: 37 IU/L (ref 0–40)
Albumin: 3.7 g/dL (ref 3.6–4.6)
Alkaline Phosphatase: 252 IU/L — ABNORMAL HIGH (ref 44–121)
Bilirubin Total: 1.3 mg/dL — ABNORMAL HIGH (ref 0.0–1.2)
Bilirubin, Direct: 0.44 mg/dL — ABNORMAL HIGH (ref 0.00–0.40)
Total Protein: 6.1 g/dL (ref 6.0–8.5)

## 2020-06-29 DIAGNOSIS — H353211 Exudative age-related macular degeneration, right eye, with active choroidal neovascularization: Secondary | ICD-10-CM | POA: Diagnosis not present

## 2020-06-29 DIAGNOSIS — E119 Type 2 diabetes mellitus without complications: Secondary | ICD-10-CM | POA: Diagnosis not present

## 2020-06-30 ENCOUNTER — Other Ambulatory Visit: Payer: Self-pay

## 2020-06-30 ENCOUNTER — Other Ambulatory Visit
Admission: RE | Admit: 2020-06-30 | Discharge: 2020-06-30 | Disposition: A | Discharge status: Home / Self Care | Payer: Medicare Other | Attending: Nurse Practitioner | Admitting: Nurse Practitioner

## 2020-06-30 ENCOUNTER — Telehealth: Payer: Self-pay | Admitting: Cardiovascular Disease

## 2020-06-30 DIAGNOSIS — I25118 Atherosclerotic heart disease of native coronary artery with other forms of angina pectoris: Secondary | ICD-10-CM | POA: Insufficient documentation

## 2020-06-30 DIAGNOSIS — I5032 Chronic diastolic (congestive) heart failure: Secondary | ICD-10-CM

## 2020-06-30 LAB — BASIC METABOLIC PANEL
Anion gap: 7 (ref 5–15)
BUN: 17 mg/dL (ref 8–23)
CO2: 22 mmol/L (ref 22–32)
Calcium: 8.7 mg/dL — ABNORMAL LOW (ref 8.9–10.3)
Chloride: 109 mmol/L (ref 98–111)
Creatinine, Ser: 1.46 mg/dL — ABNORMAL HIGH (ref 0.61–1.24)
GFR, Estimated: 47 mL/min — ABNORMAL LOW (ref >60)
Glucose, Bld: 156 mg/dL — ABNORMAL HIGH (ref 70–99)
Potassium: 4.4 mmol/L (ref 3.5–5.1)
Sodium: 138 mmol/L (ref 135–145)

## 2020-06-30 NOTE — Telephone Encounter (Signed)
Patient calling to check on status of lab results. °

## 2020-07-01 NOTE — Telephone Encounter (Signed)
Results and recommendations reviewed with patient. He is going out of town next Wednesday and requested to have repeat labs a day earlier. Lab order entered and requested that he go to [facility] to have those done. He verbalized understanding of our conversation, agreement with plan, and had no further questions at this time.

## 2020-07-01 NOTE — Telephone Encounter (Signed)
-----   Message from Theora Gianotti, NP sent at 06/30/2020  2:16 PM EDT ----- Following resumption of ramipril, his kidney function is mildly abnormal.  Electrolytes ok.  Encourage adequate hydration.  He will need to let us know if he is taking lasix (currently listed as prn).  F/u bmet in 1 wk to re-eval kidneys.

## 2020-07-02 DIAGNOSIS — Z7189 Other specified counseling: Secondary | ICD-10-CM | POA: Diagnosis not present

## 2020-07-02 DIAGNOSIS — Z03818 Encounter for observation for suspected exposure to other biological agents ruled out: Secondary | ICD-10-CM | POA: Diagnosis not present

## 2020-07-02 DIAGNOSIS — U071 COVID-19: Secondary | ICD-10-CM | POA: Diagnosis not present

## 2020-07-03 ENCOUNTER — Ambulatory Visit (INDEPENDENT_AMBULATORY_CARE_PROVIDER_SITE_OTHER): Payer: Medicare Other

## 2020-07-03 VITALS — Ht 73.0 in | Wt 217.0 lb

## 2020-07-03 DIAGNOSIS — Z0000 Encounter for general adult medical examination without abnormal findings: Secondary | ICD-10-CM

## 2020-07-03 NOTE — Patient Instructions (Addendum)
[name] [name] , Thank you for taking time to come for your Medicare Wellness Visit. I appreciate your ongoing commitment to your health goals. Please review the following plan we discussed and let me know if I can assist you in the future.   These are the goals we discussed: Goals    . Maintain Healthy Lifestyle     Weight goal less than 200lb Healthy diet Stay active        This is a list of the screening recommended for you and due dates:  Health Maintenance  Topic Date Due  . Complete foot exam   11/09/2017  . Flu Shot  10/05/2020  . Hemoglobin A1C  10/06/2020  . Eye exam for diabetics  06/18/2021  . Tetanus Vaccine  06/14/2022  . COVID-19 Vaccine  Completed  . Pneumonia vaccines  Completed  . HPV Vaccine  Aged Out   Keep all routine maintenance appointments.   Follow up 09/19/20 @ 1:45  Advanced directives: End of life planning; Advance aging; Advanced directives discussed.  Copy of current HCPOA/Living Will requested.    Conditions/risks identified: none new  Follow up in one year for your annual wellness visit.   Preventive Care 60 Years and Older, Male Preventive care refers to lifestyle choices and visits with your health care provider that can promote health and wellness. What does preventive care include?  A yearly physical exam. This is also called an annual well check.  Dental exams once or twice a year.  Routine eye exams. Ask your health care provider how often you should have your eyes checked.  Personal lifestyle choices, including:  Daily care of your teeth and gums.  Regular physical activity.  Eating a healthy diet.  Avoiding tobacco and drug use.  Limiting alcohol use.  Practicing safe sex.  Taking low doses of aspirin every day.  Taking vitamin and mineral supplements as recommended by your health care provider. What happens during an annual well check? The services and screenings done by your health care provider during your annual  well check will depend on your age, overall health, lifestyle risk factors, and family history of disease. Counseling  Your health care provider may ask you questions about your:  Alcohol use.  Tobacco use.  Drug use.  Emotional well-being.  Home and relationship well-being.  Sexual activity.  Eating habits.  History of falls.  Memory and ability to understand (cognition).  Work and work Statistician. Screening  You may have the following tests or measurements:  Height, weight, and BMI.  Blood pressure.  Lipid and cholesterol levels. These may be checked every 5 years, or more frequently if you are over 36 years old.  Skin check.  Lung cancer screening. You may have this screening every year starting at age 10 if you have a 30-pack-year history of smoking and currently smoke or have quit within the past 15 years.  Fecal occult blood test (FOBT) of the stool. You may have this test every year starting at age 14.  Flexible sigmoidoscopy or colonoscopy. You may have a sigmoidoscopy every 5 years or a colonoscopy every 10 years starting at age 67.  Prostate cancer screening. Recommendations will vary depending on your family history and other risks.  Hepatitis C blood test.  Hepatitis B blood test.  Sexually transmitted disease (STD) testing.  Diabetes screening. This is done by checking your blood sugar (glucose) after you have not eaten for a while (fasting). You may have this done every 1-3 years.  Abdominal aortic aneurysm (AAA) screening. You may need this if you are a current or former smoker.  Osteoporosis. You may be screened starting at age 81 if you are at high risk. Talk with your health care provider about your test results, treatment options, and if necessary, the need for more tests. Vaccines  Your health care provider may recommend certain vaccines, such as:  Influenza vaccine. This is recommended every year.  Tetanus, diphtheria, and acellular  pertussis (Tdap, Td) vaccine. You may need a Td booster every 10 years.  Zoster vaccine. You may need this after age 60.  Pneumococcal 13-valent conjugate (PCV13) vaccine. One dose is recommended after age 49.  Pneumococcal polysaccharide (PPSV23) vaccine. One dose is recommended after age 51. Talk to your health care provider about which screenings and vaccines you need and how often you need them. This information is not intended to replace advice given to you by your health care provider. Make sure you discuss any questions you have with your health care provider. Document Released: 03/20/2015 Document Revised: 11/11/2015 Document Reviewed: 12/23/2014 Elsevier Interactive Patient Education  2017 [place] Prevention in the Home Falls can cause injuries. They can happen to people of all ages. There are many things you can do to make your home safe and to help prevent falls. What can I do on the outside of my home?  Regularly fix the edges of walkways and driveways and fix any cracks.  Remove anything that might make you trip as you walk through a door, such as a raised step or threshold.  Trim any bushes or trees on the path to your home.  Use bright outdoor lighting.  Clear any walking paths of anything that might make someone trip, such as rocks or tools.  Regularly check to see if handrails are loose or broken. Make sure that both sides of any steps have handrails.  Any raised decks and porches should have guardrails on the edges.  Have any leaves, snow, or ice cleared regularly.  Use sand or salt on walking paths during winter.  Clean up any spills in your garage right away. This includes oil or grease spills. What can I do in the bathroom?  Use night lights.  Install grab bars by the toilet and in the tub and shower. Do not use towel bars as grab bars.  Use non-skid mats or decals in the tub or shower.  If you need to sit down in the shower, use a plastic,  non-slip stool.  Keep the floor dry. Clean up any water that spills on the floor as soon as it happens.  Remove soap buildup in the tub or shower regularly.  Attach bath mats securely with double-sided non-slip rug tape.  Do not have throw rugs and other things on the floor that can make you trip. What can I do in the bedroom?  Use night lights.  Make sure that you have a light by your bed that is easy to reach.  Do not use any sheets or blankets that are too big for your bed. They should not hang down onto the floor.  Have a firm chair that has side arms. You can use this for support while you get dressed.  Do not have throw rugs and other things on the floor that can make you trip. What can I do in the kitchen?  Clean up any spills right away.  Avoid walking on wet floors.  Keep items that you use  a lot in easy-to-reach places.  If you need to reach something above you, use a strong step stool that has a grab bar.  Keep electrical cords out of the way.  Do not use floor polish or wax that makes floors slippery. If you must use wax, use non-skid floor wax.  Do not have throw rugs and other things on the floor that can make you trip. What can I do with my stairs?  Do not leave any items on the stairs.  Make sure that there are handrails on both sides of the stairs and use them. Fix handrails that are broken or loose. Make sure that handrails are as long as the stairways.  Check any carpeting to make sure that it is firmly attached to the stairs. Fix any carpet that is loose or worn.  Avoid having throw rugs at the top or bottom of the stairs. If you do have throw rugs, attach them to the floor with carpet tape.  Make sure that you have a light switch at the top of the stairs and the bottom of the stairs. If you do not have them, ask someone to add them for you. What else can I do to help prevent falls?  Wear shoes that:  Do not have high heels.  Have rubber  bottoms.  Are comfortable and fit you well.  Are closed at the toe. Do not wear sandals.  If you use a stepladder:  Make sure that it is fully opened. Do not climb a closed stepladder.  Make sure that both sides of the stepladder are locked into place.  Ask someone to hold it for you, if possible.  Clearly mark and make sure that you can see:  Any grab bars or handrails.  First and last steps.  Where the edge of each step is.  Use tools that help you move around (mobility aids) if they are needed. These include:  Canes.  Walkers.  Scooters.  Crutches.  Turn on the lights when you go into a dark area. Replace any light bulbs as soon as they burn out.  Set up your furniture so you have a clear path. Avoid moving your furniture around.  If any of your floors are uneven, fix them.  If there are any pets around you, be aware of where they are.  Review your medicines with your doctor. Some medicines can make you feel dizzy. This can increase your chance of falling. Ask your doctor what other things that you can do to help prevent falls. This information is not intended to replace advice given to you by your health care provider. Make sure you discuss any questions you have with your health care provider. Document Released: 12/18/2008 Document Revised: 07/30/2015 Document Reviewed: 03/28/2014 Elsevier Interactive Patient Education  2017 [employer].

## 2020-07-03 NOTE — Progress Notes (Signed)
Subjective:   [name] [name] is a 87 y.o. male who presents for Medicare Annual/Subsequent preventive examination.  Review of Systems    No ROS.  Medicare Wellness Virtual Visit.     Cardiac Risk Factors include: advanced age (>51men, >56 women);male gender;hypertension;diabetes mellitus     Objective:    Today's Vitals   07/03/20 1122  Weight: 217 lb (98.4 kg)  Height: 6\' 1"  (1.854 m)   Body mass index is 28.63 kg/m.  Advanced Directives 07/03/2020 05/12/2020 05/11/2020 05/11/2020 04/08/2020 04/08/2020 02/21/2019  Does Patient Have a Medical Advance Directive? Yes No - No Yes Yes Yes  Type of Paramedic of [place];Living will - - - [place];Living will Living will;Healthcare Power of Attorney Living will;Healthcare Power of Attorney  Does patient want to make changes to medical advance directive? No - Patient declined - - - No - Patient declined - No - Patient declined  Copy of [place] in Chart? No - copy requested - - - - - No - copy requested  Would patient like information on creating a medical advance directive? - No - Patient declined No - Patient declined No - Patient declined - - -    Current Medications (verified) Outpatient Encounter Medications as of 07/03/2020  Medication Sig  . amLODipine (NORVASC) 10 MG tablet Take 1 tablet (10 mg total) by mouth daily.  Marland Kitchen aspirin EC 81 MG tablet Take 81 mg by mouth every other day.  . carvedilol (COREG) 12.5 MG tablet TAKE ONE (1) TABLET BY MOUTH TWO TIMES PER DAY  . ezetimibe (ZETIA) 10 MG tablet Take 1 tablet (10 mg total) by mouth daily.  . finasteride (PROSCAR) 5 MG tablet Take 1 tablet (5 mg total) by mouth daily.  Marland Kitchen FLUZONE HIGH-DOSE QUADRIVALENT 0.7 ML SUSY   . gabapentin (NEURONTIN) 100 MG capsule TAKE 1 CAPSULE BY MOUTH 3 TIMES DAILY  . hydrOXYzine (ATARAX/VISTARIL) 10 MG tablet Take 1 tablet (10 mg total) by mouth 3 (three) times daily as needed for up to 30  doses for itching.  Marland Kitchen omeprazole (PRILOSEC) 20 MG capsule TAKE 1 CAPSULE BY MOUTH TWICE DAILY  . ramipril (ALTACE) 10 MG capsule Take 1 capsule (10 mg total) by mouth daily.  Nelva Nay SOLOSTAR 300 UNIT/ML Solostar Pen INJECT 50 UNITS DAILY MAY INCREASE 2U EVERY 3 DAYS UNTIL FBG IS AT GOAL UP TO 80 UNITS DAILY  . Vitamin D, Cholecalciferol, 1000 units TABS Take 1,000 Units by mouth daily.   Marland Kitchen zolpidem (AMBIEN) 5 MG tablet Take 1 tablet (5 mg total) by mouth at bedtime as needed for sleep. Stop trazadone not helpful   No facility-administered encounter medications on file as of 07/03/2020.    Allergies (verified) Cortisone, Metformin, and Metformin and related   History: Past Medical History:  Diagnosis Date  . (HFpEF) heart failure with preserved ejection fraction ([place])    a. 03/6551 Echo: nl systolic fxn, mild LVH, diastolic relaxation abnormality, mildly enlarged LA, mild Ao insufficiency; 01/2018 Echo: EF 55-60%, no rwma, GR2 DD, triv AI, mildly dil LA, mild inc PASP.  Marland Kitchen BPH (benign prostatic hyperplasia)   . Cancer ([place])    skin  . Cataract   . Choledocholithiasis    a. 05/2020 ERCP w/ CBD stone removal and biliary sphincterotomy.  . Coronary artery disease    a. 01/2005 s/p CABG x 4: LIMA-LAD, VG-Diag, VG-OM3, VG-dRCA; b. 06/2013 Cath: patent grafts; c. 09/2015 Cath: VG->Diag 100, other grafts patent.  LAD 100, RCA sev dzs->Med rx; d. 01/2018 Cath: LAD 100ost/p, LCX 50, RCA 100p/d, VG->OM3 100, VG->Diag 100, LIMA->LAD ok, VG->RPAV 48m-->Med rx.  Marland Kitchen Dyspnea    with exertion  . Dysrhythmia   . ED (erectile dysfunction)   . Elevated LFTs    a. 05/2020 following CBD stone. 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  Marland Kitchen GERD (gastroesophageal reflux disease)   . Hyperlipidemia   . Hypertension   . IDDM (insulin dependent diabetes mellitus) 2000  . Osteoarthritis   . PAH (pulmonary artery hypertension) ([facility])    a. 01/2018 RHC: PA 51/22 (32).  .  Perirectal fistula   . Pneumonia 12/2016  . Shingles 09/04/2018  . Tubular adenoma of colon 07/2012  . Vertigo    Past Surgical History:  Procedure Laterality Date  . BACK SURGERY    . CARDIAC CATHETERIZATION  06/05/2013   [facility].   Marland Kitchen CARDIAC CATHETERIZATION N/A 09/24/2015   Procedure: Right Heart Cath and Coronary/Graft Angiography;  Surgeon: Antonieta Iba, MD;  Location: [facility] INVASIVE CV LAB;  Service: Cardiovascular;  Laterality: N/A;  . CATARACT EXTRACTION  sept 2013   right  . CATARACT EXTRACTION W/PHACO Left 03/28/2017   Procedure: CATARACT EXTRACTION PHACO AND INTRAOCULAR LENS PLACEMENT (IOC);  Surgeon: Galen Manila, MD;  Location: [facility] ORS;  Service: Ophthalmology;  Laterality: Left;  [place] 00:42.0 AP% 15.0 CDE 6.31 Fluid Pack lot # [id number]  . CHOLECYSTECTOMY  05/15/2011   Procedure: LAPAROSCOPIC CHOLECYSTECTOMY;  Surgeon: Emelia Loron, MD;  Location: [facility] OR;  Service: General;  Laterality: N/A;  . COLONOSCOPY W/ POLYPECTOMY    . CORONARY ARTERY BYPASS GRAFT  2007   x 4  . ERCP N/A 05/05/2020   Procedure: ENDOSCOPIC RETROGRADE CHOLANGIOPANCREATOGRAPHY (ERCP);  Surgeon: Midge Minium, MD;  Location: [facility] ENDOSCOPY;  Service: Endoscopy;  Laterality: N/A;  . ERCP N/A 05/12/2020   Procedure: ENDOSCOPIC RETROGRADE CHOLANGIOPANCREATOGRAPHY (ERCP);  Surgeon: Midge Minium, MD;  Location[facility] ENDOSCOPY;  Service: Endoscopy;  Laterality: N/A;  . ESOPHAGOGASTRODUODENOSCOPY N/A 05/12/2020   Procedure: ESOPHAGOGASTRODUODENOSCOPY (EGD);  Surgeon: Midge Minium, MD;  Location: [facility] ENDOSCOPY;  Service: Endoscopy;  Laterality: N/A;  . EYE SURGERY    . FOOT SURGERY  2012   right foot  . JOINT REPLACEMENT  8/10   Right THR--[name]  . LEFT AND RIGHT HEART CATHETERIZATION WITH CORONARY/GRAFT ANGIOGRAM N/A 06/05/2013   Procedure: LEFT AND RIGHT HEART CATHETERIZATION WITH Isabel Caprice;  Surgeon: Peter M Swaziland, MD;  Location: [facility] CATH LAB;  Service: Cardiovascular;  Laterality: N/A;   . LUMBAR LAMINECTOMY  1989  . Prostate photovaporization  5/16   Dr Raeanne Gathers  . RIGHT/LEFT HEART CATH AND CORONARY/GRAFT ANGIOGRAPHY N/A 01/05/2018   Procedure: RIGHT/LEFT HEART CATH AND CORONARY/GRAFT ANGIOGRAPHY;  Surgeon: Antonieta Iba, MD;  Location: [facility] INVASIVE CV LAB;  Service: Cardiovascular;  Laterality: N/A;   Family History  Problem Relation Age of Onset  . Lung cancer Mother   . Heart disease Father   . Colon cancer Neg Hx   . Breast cancer Neg Hx    Social History   Socioeconomic History  . Marital status: Married    Spouse name: Jan  . Number of children: 4  . Years of education: Not on file  . Highest education level: Not on file  Occupational History  . Occupation: Warden/ranger    Comment: Animator  Tobacco Use  . Smoking status: Never Smoker  . Smokeless tobacco: Never Used  Vaping  Use  . Vaping Use: Never used  Substance and Sexual Activity  . Alcohol use: Yes    Comment: occ cocktail  . Drug use: No  . Sexual activity: Not Currently  Other Topics Concern  . Not on file  Social History Narrative   Goes by [name]   Has living will    Has designated [name] as health care POA   Would accept resuscitation attempts   Would not want tube feeds if cognitively unaware   Social Determinants of Health   Financial Resource Strain: Low Risk   . Difficulty of Paying Living Expenses: Not hard at all  Food Insecurity: No Food Insecurity  . Worried About Charity fundraiser in the Last Year: Never true  . Ran Out of Food in the Last Year: Never true  Transportation Needs: No Transportation Needs  . Lack of Transportation (Medical): No  . Lack of Transportation (Non-Medical): No  Physical Activity: Sufficiently Active  . Days of Exercise per Week: 4 days  . Minutes of Exercise per Session: 40 min  Stress: No Stress Concern Present  . Feeling of Stress : Not at all  Social Connections: Unknown  . Frequency of  Communication with Friends and Family: Not on file  . Frequency of Social Gatherings with Friends and Family: Not on file  . Attends Religious Services: Not on file  . Active Member of Clubs or Organizations: Not on file  . Attends Archivist Meetings: Not on file  . Marital Status: Married    Tobacco Counseling Counseling given: Not Answered   Clinical Intake:  Pre-visit preparation completed: Yes        Diabetes: Yes (Followed by Endocrinology)  How often do you need to have someone help you when you read instructions, pamphlets, or other written materials from your doctor or pharmacy?: 1 - Never    Interpreter Needed?: No      Activities of Daily Living In your present state of health, do you have any difficulty performing the following activities: 07/03/2020 05/11/2020  Hearing? N -  Vision? N -  Difficulty concentrating or making decisions? N -  Walking or climbing stairs? N -  Dressing or bathing? N -  Doing errands, shopping? N N  Preparing Food and eating ? N -  Using the Toilet? N -  In the past six months, have you accidently leaked urine? N -  Do you have problems with loss of bowel control? N -  Managing your Medications? N -  Managing your Finances? N -  Housekeeping or managing your Housekeeping? N -  Some recent data might be hidden    Patient Care Team: [name], MD as PCP - General (Family Medicine) Rockey Situ, Kathlene November, MD as PCP - Cardiology (Cardiology) Minna Merritts, MD as Consulting Physician (Cardiology)  Indicate any recent Medical Services you may have received from other than [facility] providers in the past year (date may be approximate).     Assessment:   This is a routine wellness examination for [name] [name].  I connected with [name] [name] today by telephone and verified that I am speaking with the correct person using two identifiers. Location patient: home Location provider: work Persons participating in the virtual  visit: patient, Marine scientist.    I discussed the limitations, risks, security and privacy concerns of performing an evaluation and management service by telephone and the availability of in person appointments. The patient expressed understanding and verbally consented to this telephonic visit.  Interactive audio and video telecommunications were attempted between this provider and patient, however failed, due to patient having technical difficulties OR patient did not have access to video capability.  We continued and completed visit with audio only.  Some vital signs may be absent or patient reported.   Hearing/Vision screen  Hearing Screening   125Hz  250Hz  500Hz  1000Hz  2000Hz  3000Hz  4000Hz  6000Hz  8000Hz   Right ear:           Left ear:           Comments: Patient is able to hear conversational tones without difficulty.  No issues reported.  Vision Screening Comments: Wears corrective lenses Visual acuity not assessed, virtual visit.  Macular degeneration; injections They have seen their ophthalmologist in the last 12 months.     Dietary issues and exercise activities discussed: Current Exercise Habits: Home exercise routine, Type of exercise: calisthenics, Frequency (Times/Week): 4, Intensity: Moderate   Healthy diet Good water intake  Goals    . Maintain Healthy Lifestyle     Weight goal less than 200lb Healthy diet Stay active       Depression Screen PHQ 2/9 Scores 07/03/2020 05/20/2020 04/06/2020 05/07/2019 11/27/2018 10/15/2018 10/10/2018  PHQ - 2 Score 0 0 0 0 0 0 0  PHQ- 9 Score - - - - - - -    Fall Risk Fall Risk  07/03/2020 05/20/2020 04/06/2020 05/07/2019 11/27/2018  Falls in the past year? 0 0 0 1 0  Number falls in past yr: 0 0 0 0 0  Injury with Fall? 0 0 0 1 -  Risk for fall due to : - History of fall(s) - - -  Follow up Falls evaluation completed Falls evaluation completed Falls evaluation completed Falls evaluation completed Falls evaluation completed    [place]: Handrails in use when climbing stairs? Yes Home free of loose throw rugs in walkways, pet beds, electrical cords, etc? Yes  Adequate lighting in your home to reduce risk of falls? Yes   ASSISTIVE DEVICES UTILIZED TO PREVENT FALLS: Use of a cane, walker or w/c? No   TIMED UP AND GO: Was the test performed? No .   Cognitive Function:  Patient is alert and oriented x3.  Denies difficulty focusing, making decisions, memory loss.  MMSE/6CIT deferred. Normal by direct communication/observation.      Immunizations Immunization History  Administered Date(s) Administered  . Influenza Split 12/24/2010  . Influenza Whole 12/05/2012  . Influenza, High Dose Seasonal PF 01/02/2015, 11/09/2016, 12/14/2017, 12/19/2018  . Influenza, Seasonal, Injecte, Preservative Fre 12/11/2007  . Influenza-Unspecified 12/27/2013, 12/24/2019  . PFIZER(Purple Top)SARS-COV-2 Vaccination 03/28/2019, 04/18/2019, 12/16/2019  . Pneumococcal Conjugate-13 02/05/2014, 01/02/2015  . Pneumococcal Polysaccharide-23 03/07/2010  . Td 06/13/2012  . Zoster 12/01/2010  . Zoster Recombinat (Shingrix) 11/13/2017, 03/04/2019   Health Maintenance Health Maintenance  Topic Date Due  . FOOT EXAM  11/09/2017  . INFLUENZA VACCINE  10/05/2020  . HEMOGLOBIN A1C  10/06/2020  . OPHTHALMOLOGY EXAM  06/18/2021  . TETANUS/TDAP  06/14/2022  . COVID-19 Vaccine  Completed  . PNA vac Low Risk Adult  Completed  . HPV VACCINES  Aged Out   Colorectal cancer screening: No longer required.   Lung Cancer Screening: (Low Dose CT Chest recommended if Age 79-80 years, 30 pack-year currently smoking OR have quit w/in 15years.) does not qualify.   Hepatitis C Screening: does not qualify.  Vision Screening: Recommended annual ophthalmology exams for early detection of glaucoma and other disorders of  the eye. Is the patient up to date with their annual eye exam?  Yes   Dental Screening: Recommended annual  dental exams for proper oral hygiene  Community Resource Referral / Chronic Care Management: CRR required this visit?  No   CCM required this visit?  No      Plan:   Keep all routine maintenance appointments.   Follow up 09/19/20 @ 1:45  I have personally reviewed and noted the following in the patient's chart:   . Medical and social history . Use of alcohol, tobacco or illicit drugs  . Current medications and supplements . Functional ability and status . Nutritional status . Physical activity . Advanced directives . List of other physicians . Hospitalizations, surgeries, and ER visits in previous 12 months . Vitals . Screenings to include cognitive, depression, and falls . Referrals and appointments  In addition, I have reviewed and discussed with patient certain preventive protocols, quality metrics, and best practice recommendations. A written personalized care plan for preventive services as well as general preventive health recommendations were provided to patient.     Ashok Pall, LPN   2/62/0355

## 2020-07-07 ENCOUNTER — Other Ambulatory Visit: Payer: Self-pay | Admitting: Family Medicine

## 2020-07-07 ENCOUNTER — Other Ambulatory Visit: Payer: Self-pay | Admitting: Cardiovascular Disease

## 2020-07-08 ENCOUNTER — Ambulatory Visit: Payer: Medicare Other | Admitting: Gastroenterology

## 2020-07-15 ENCOUNTER — Other Ambulatory Visit: Payer: Self-pay

## 2020-07-15 ENCOUNTER — Ambulatory Visit: Payer: Medicare Other | Admitting: Gastroenterology

## 2020-07-15 ENCOUNTER — Encounter: Payer: Self-pay | Admitting: Gastroenterology

## 2020-07-15 VITALS — BP 129/62 | HR 63 | Temp 98.4°F | Ht 73.0 in | Wt 216.2 lb

## 2020-07-15 DIAGNOSIS — R748 Abnormal levels of other serum enzymes: Secondary | ICD-10-CM | POA: Diagnosis not present

## 2020-07-15 NOTE — Progress Notes (Signed)
[name] [address]  [place]  [place], [place] [address]  Main: [phone]  Fax: [phone]   Primary Care Physician: [name], MD   Chief Complaint  Patient presents with  . Elevated Hepatic Enzymes    HPI: [name] [name] is a 87 y.o. male here for follow-up ulcerative sepsis and elevated liver enzymes.  Patient states his pruritus has completely resolved. The patient denies abdominal or flank pain, anorexia, nausea or vomiting, dysphagia, change in bowel habits or black or bloody stools or weight loss.   Current Outpatient Medications  Medication Sig Dispense Refill  . amLODipine (NORVASC) 10 MG tablet Take 1 tablet (10 mg total) by mouth daily. 90 tablet 1  . aspirin EC 81 MG tablet Take 81 mg by mouth every other day.    . carvedilol (COREG) 12.5 MG tablet TAKE ONE (1) TABLET BY MOUTH TWO TIMES PER DAY 180 tablet 0  . ezetimibe (ZETIA) 10 MG tablet Take 1 tablet (10 mg total) by mouth daily. 90 tablet 3  . finasteride (PROSCAR) 5 MG tablet Take 1 tablet (5 mg total) by mouth daily. 90 tablet 1  . gabapentin (NEURONTIN) 100 MG capsule TAKE 1 CAPSULE BY MOUTH 3 TIMES DAILY 90 capsule 1  . hydrOXYzine (ATARAX/VISTARIL) 10 MG tablet Take 1 tablet (10 mg total) by mouth 3 (three) times daily as needed for up to 30 doses for itching. 30 tablet 0  . omeprazole (PRILOSEC) 20 MG capsule TAKE 1 CAPSULE BY MOUTH TWICE DAILY 60 capsule 1  . ramipril (ALTACE) 10 MG capsule Take 1 capsule (10 mg total) by mouth daily. 90 capsule 3  . TOUJEO SOLOSTAR 300 UNIT/ML Solostar Pen INJECT 50 UNITS DAILY MAY INCREASE 2U EVERY 3 DAYS UNTIL FBG IS AT GOAL UP TO 80 UNITS DAILY 22.5 mL 6  . Vitamin D, Cholecalciferol, 1000 units TABS Take 1,000 Units by mouth daily.     Marland Kitchen zolpidem (AMBIEN) 5 MG tablet Take 1 tablet (5 mg total) by mouth at bedtime as needed for sleep. Stop trazadone not helpful 30 tablet 0   No current facility-administered medications for this  visit.    Allergies as of 07/15/2020 - Review Complete 07/15/2020  Allergen Reaction Noted  . Cortisone Other (See Comments) 06/24/2016  . Metformin Diarrhea 09/05/2016  . Metformin and related Diarrhea 09/05/2016    ROS:  General: Negative for anorexia, weight loss, fever, chills, fatigue, weakness. ENT: Negative for hoarseness, difficulty swallowing , nasal congestion. CV: Negative for chest pain, angina, palpitations, dyspnea on exertion, peripheral edema.  Respiratory: Negative for dyspnea at rest, dyspnea on exertion, cough, sputum, wheezing.  GI: See history of present illness. GU:  Negative for dysuria, hematuria, urinary incontinence, urinary frequency, nocturnal urination.  Endo: Negative for unusual weight change.    Physical Examination:   BP 129/62   Pulse 63   Temp 98.4 F (36.9 C) (Oral)   Ht _0  (1.854 m)   Wt 216 lb 3.2 oz (98.1 kg)   BMI 28.52 kg/m   General: Well-nourished, well-developed in no acute distress.  Eyes: No icterus. Conjunctivae pink. Mouth: Oropharyngeal mucosa moist and pink , no lesions erythema or exudate. Neck: Supple, Trachea midline Abdomen: Bowel sounds are normal, nontender, nondistended, no hepatosplenomegaly or masses, no abdominal bruits or hernia , no rebound or guarding.   Extremities: No lower extremity edema. No clubbing or deformities. Neuro: Alert and oriented x 3.  Grossly intact. Skin: Warm and dry, no  jaundice.   Psych: Alert and cooperative, normal mood and affect.   Labs: CMP     Component Value Date/Time   NA 138 06/30/2020 1231   NA 140 04/27/2020 0930   NA 139 02/22/2013 1654   K 4.4 06/30/2020 1231   K 4.5 02/22/2013 1654   CL 109 06/30/2020 1231   CL 109 (H) 02/22/2013 1654   CO2 22 06/30/2020 1231   CO2 24 02/22/2013 1654   GLUCOSE 156 (H) 06/30/2020 1231   GLUCOSE 197 (H) 02/22/2013 1654   BUN 17 06/30/2020 1231   BUN 16 04/27/2020 0930   BUN 20 (H) 02/22/2013 1654   CREATININE 1.46 (H)  06/30/2020 1231   CREATININE 1.18 02/22/2013 1654   CREATININE 1.13 05/25/2012 1630   CALCIUM 8.7 (L) 06/30/2020 1231   CALCIUM 9.1 02/22/2013 1654   PROT 6.1 06/24/2020 1207   PROT 6.2 (L) 02/22/2013 1654   ALBUMIN 3.7 06/24/2020 1207   ALBUMIN 3.5 02/22/2013 1654   AST 37 06/24/2020 1207   AST 20 02/22/2013 1654   ALT 28 06/24/2020 1207   ALT 42 02/22/2013 1654   ALKPHOS 252 (H) 06/24/2020 1207   ALKPHOS 113 02/22/2013 1654   BILITOT 1.3 (H) 06/24/2020 1207   BILITOT 0.6 02/22/2013 1654   GFRNONAA 47 (L) 06/30/2020 1231   GFRNONAA 59 (L) 02/22/2013 1654   GFRAA 53 (L) 04/27/2020 0930   GFRAA >60 02/22/2013 1654   Lab Results  Component Value Date   WBC 5.2 05/15/2020   HGB 11.5 (L) 05/15/2020   HCT 34.1 (L) 05/15/2020   MCV 94.7 05/15/2020   PLT 104 (L) 05/15/2020    Imaging Studies: No results found.  Assessment and Plan:   [name] [name] is a 87 y.o. y/o male was recently elevated liver enzymes and liver biopsies most consistent with etiology being from biliary obstruction, and choledocholithiasis here for follow-up  Patient is currently asymptomatic Last liver enzymes were improving consistent with his acute elevation liver enzymes being from choledocholithiasis Not a chronic process, which was consistent with his liver biopsy as well  I will repeat liver enzymes at this time to ensure that the mildly elevated alk phos last check has normalized  If symptoms recur down the road, patient was advised to call us back and he verbalized understanding    Dr [name]

## 2020-07-16 LAB — HEPATIC FUNCTION PANEL
ALT: 22 IU/L (ref 0–44)
AST: 22 IU/L (ref 0–40)
Albumin: 3.8 g/dL (ref 3.6–4.6)
Alkaline Phosphatase: 208 IU/L — ABNORMAL HIGH (ref 44–121)
Bilirubin Total: 0.6 mg/dL (ref 0.0–1.2)
Bilirubin, Direct: 0.26 mg/dL (ref 0.00–0.40)
Total Protein: 5.9 g/dL — ABNORMAL LOW (ref 6.0–8.5)

## 2020-07-21 ENCOUNTER — Telehealth: Payer: Self-pay | Admitting: Family Medicine

## 2020-07-21 NOTE — Addendum Note (Signed)
Addended by: [name] on: 07/21/2020 09:10 AM   Modules accepted: Orders

## 2020-07-21 NOTE — Telephone Encounter (Signed)
Pt called and wanted to know the name of the muscle relaxer that he has taken in the past for back spasms Pt stated thst he started having them again and wanted something called in

## 2020-07-22 ENCOUNTER — Ambulatory Visit (INDEPENDENT_AMBULATORY_CARE_PROVIDER_SITE_OTHER): Payer: Medicare Other | Admitting: Family Medicine

## 2020-07-22 ENCOUNTER — Ambulatory Visit: Payer: Medicare Other | Admitting: Nurse Practitioner

## 2020-07-22 ENCOUNTER — Encounter: Payer: Self-pay | Admitting: Family Medicine

## 2020-07-22 ENCOUNTER — Encounter: Payer: Self-pay | Admitting: Nurse Practitioner

## 2020-07-22 ENCOUNTER — Other Ambulatory Visit: Payer: Self-pay

## 2020-07-22 VITALS — BP 152/62 | HR 61 | Ht 73.0 in | Wt 215.4 lb

## 2020-07-22 VITALS — BP 160/60 | HR 66 | Temp 98.2°F | Ht 73.0 in | Wt 214.8 lb

## 2020-07-22 DIAGNOSIS — I2510 Atherosclerotic heart disease of native coronary artery without angina pectoris: Secondary | ICD-10-CM

## 2020-07-22 DIAGNOSIS — N183 Chronic kidney disease, stage 3 unspecified: Secondary | ICD-10-CM

## 2020-07-22 DIAGNOSIS — Z8616 Personal history of COVID-19: Secondary | ICD-10-CM | POA: Insufficient documentation

## 2020-07-22 DIAGNOSIS — I10 Essential (primary) hypertension: Secondary | ICD-10-CM

## 2020-07-22 DIAGNOSIS — G8929 Other chronic pain: Secondary | ICD-10-CM

## 2020-07-22 DIAGNOSIS — E785 Hyperlipidemia, unspecified: Secondary | ICD-10-CM | POA: Diagnosis not present

## 2020-07-22 DIAGNOSIS — M545 Low back pain, unspecified: Secondary | ICD-10-CM

## 2020-07-22 DIAGNOSIS — E118 Type 2 diabetes mellitus with unspecified complications: Secondary | ICD-10-CM | POA: Diagnosis not present

## 2020-07-22 DIAGNOSIS — I5032 Chronic diastolic (congestive) heart failure: Secondary | ICD-10-CM

## 2020-07-22 LAB — POCT GLYCOSYLATED HEMOGLOBIN (HGB A1C): Hemoglobin A1C: 6.8 % — AB (ref 4.0–5.6)

## 2020-07-22 MED ORDER — BACLOFEN 10 MG PO TABS: 10.0000 mg | ORAL_TABLET | Freq: Three times a day (TID) | ORAL | 0 refills | Status: DC | PRN

## 2020-07-22 NOTE — Progress Notes (Signed)
Office Visit    Patient Name: [name] [name] Date of Encounter: 07/22/2020  Primary Care Provider:  [name], MD Primary Cardiologist:  Ida Rogue, MD  Chief Complaint    87 year old male with a history of CAD status post CABG x4 in 2009 (2/4 patent grafts on cath 01/2018), HFpEF, hypertension, hyperlipidemia, PAH, diabetes mellitus, stage III chronic kidney disease, and BPH, who presents for follow-up related to hypertension.  Past Medical History    Past Medical History:  Diagnosis Date  . (HFpEF) heart failure with preserved ejection fraction ([place])    a. 03/6107 Echo: nl systolic fxn, mild LVH, diastolic relaxation abnormality, mildly enlarged LA, mild Ao insufficiency; 01/2018 Echo: EF 55-60%, no rwma, GR2 DD, triv AI, mildly dil LA, mild inc PASP.  Marland Kitchen BPH (benign prostatic hyperplasia)   . Cancer ([place])    skin  . Cataract   . Choledocholithiasis    a. 05/2020 ERCP w/ CBD stone removal and biliary sphincterotomy.  . Coronary artery disease    a. 01/2005 s/p CABG x 4: LIMA-LAD, VG-Diag, VG-OM3, VG-dRCA; b. 06/2013 Cath: patent grafts; c. 09/2015 Cath: VG->Diag 100, other grafts patent. LAD 100, RCA sev dzs->Med rx; d. 01/2018 Cath: LAD 100ost/p, LCX 50, RCA 100p/d, VG->OM3 100, VG->Diag 100, LIMA->LAD ok, VG->RPAV 98m-->Med rx.  Marland Kitchen Dyspnea    with exertion  . Dysrhythmia   . ED (erectile dysfunction)   . Elevated LFTs    a. 05/2020 following CBD stone. 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  Marland Kitchen GERD (gastroesophageal reflux disease)   . Hyperlipidemia   . Hypertension   . IDDM (insulin dependent diabetes mellitus) 2000  . Osteoarthritis   . PAH (pulmonary artery hypertension) ([place])    a. 01/2018 RHC: PA 51/22 (32).  . Perirectal fistula   . Pneumonia 12/2016  . Shingles 09/04/2018  . Tubular adenoma of colon 07/2012  . Vertigo    Past Surgical History:  Procedure Laterality Date  . BACK SURGERY    . CARDIAC  CATHETERIZATION  06/05/2013   [facility].   Marland Kitchen CARDIAC CATHETERIZATION N/A 09/24/2015   Procedure: Right Heart Cath and Coronary/Graft Angiography;  Surgeon: Minna Merritts, MD;  Location: [place] [facility];  Service: Cardiovascular;  Laterality: N/A;  . CATARACT EXTRACTION  sept 2013   right  . CATARACT EXTRACTION W/PHACO Left 03/28/2017   Procedure: CATARACT EXTRACTION PHACO AND INTRAOCULAR LENS PLACEMENT (IOC);  Surgeon: Birder Robson, MD;  Location: [facility] ORS;  Service: Ophthalmology;  Laterality: Left;  [place] 00:42.0 AP% 15.0 CDE 6.31 Fluid Pack lot # [id number]  . CHOLECYSTECTOMY  05/15/2011   Procedure: LAPAROSCOPIC CHOLECYSTECTOMY;  Surgeon: Rolm Bookbinder, MD;  Location: [place];  Service: General;  Laterality: N/A;  . COLONOSCOPY W/ POLYPECTOMY    . CORONARY ARTERY BYPASS GRAFT  2007   x 4  . ERCP N/A 05/05/2020   Procedure: ENDOSCOPIC RETROGRADE CHOLANGIOPANCREATOGRAPHY (ERCP);  Surgeon: Lucilla Lame, MD;  Location: [facility] ENDOSCOPY;  Service: Endoscopy;  Laterality: N/A;  . ERCP N/A 05/12/2020   Procedure: ENDOSCOPIC RETROGRADE CHOLANGIOPANCREATOGRAPHY (ERCP);  Surgeon: Lucilla Lame, MD;  Location: [facility] END[facility]COPY;  Service: Endoscopy;  Laterality: N/A;  . ESOPHAGOGASTRODUODENOSCOPY N/A 05/12/2020   Procedure: ESOPHAGOGASTRODUODENOSCOPY (EGD);  Surgeon: Lucilla Lame, MD;  Location: [facility] ENDOSCOPY;  Service: Endoscopy;  Laterality: N/A;  . EYE SURGERY    . FOOT SURGERY  2012   right foot  . JOINT REPLACEMENT  8/10   Right THR--[name]  .  LEFT AND RIGHT HEART CATHETERIZATION WITH CORONARY/GRAFT ANGIOGRAM N/A 06/05/2013   Procedure: LEFT AND RIGHT HEART CATHETERIZATION WITH Beatrix Fetters;  Surgeon: Peter M Martinique, MD;  Location: [facility] CATH LAB;  Service: Cardiovascular;  Laterality: N/A;  . LUMBAR LAMINECTOMY  1989  . Prostate photovaporization  5/16   Dr Budd Palmer  . RIGHT/LEFT HEART CATH AND CORONARY/GRAFT ANGIOGRAPHY N/A 01/05/2018   Procedure: RIGHT/LEFT HEART CATH AND  CORONARY/GRAFT ANGIOGRAPHY;  Surgeon: Minna Merritts, MD;  Location: [place] [facility];  Service: Cardiovascular;  Laterality: N/A;    Allergies  Allergies  Allergen Reactions  . Cortisone Other (See Comments)    Reaction: Leg Cramps  . Metformin Diarrhea  . Metformin And Related Diarrhea    History of Present Illness    87 year old male with above complex past medical history including CAD, hypertension, hyperlipidemia, diabetes, HFpEF, PAH, CKD 3, and BPH.  He underwent CABG x4 in 2009 with subsequent cath in April 2015 (patent grafts), July 2017 (3/4 patent grafts), and November 2019 (2/4 patent grafts).  He has been medically managed.  Most recent echo in November 2019 showed an EF of 55-60% with grade 2 diastolic dysfunction, and mildly increased PASP.  In February 2022, he was hospitalized with nausea/vomiting, AKI, and elevated LFTs.  Creatinine was up to 1.4 in the setting of volume depletion and home doses of Lasix and ramipril were discontinued.  He underwent ERCP with biliary sphincterectomy and balloon extraction of choledocholithiasis on May 05, 2020.  He was readmitted May 15, 2020 with GI bleed and ongoing LFT elevations with jaundice.  ERCP showed small hiatal hernia, normal stomach, normal duodenum, and patent prior biliary sphincterotomy.  The biliary tree was swept and nothing was found.  Liver biopsy was performed and has since returned showing changes consistent with common bile duct obstruction, and not a chronic process.  He has since been followed by GI.  LFTs last week were normal.  He was last seen in cardiology clinic on April 20 in the setting of elevated blood pressures.  He also reported chronic, stable dyspnea on exertion without chest pain.  Ramipril 10 mg daily was resumed.  Follow-up basic metabolic panel was notable for a rise in creatinine to 1.46.  He was advised to improve hydration and follow-up basic metabolic panel a week later, which does not appear  to have been performed.  On April 28, he was seen by primary care related to upper respiratory symptoms and tested positive for COVID.  He received 5-day course of Paxovid and says that he recovered fairly quickly, though he continues to have a nagging cough.  He has chronic, stable dyspnea on exertion.  He denies chest pain.  Blood pressures have been trending in the 160s at home despite resumption of Prempro.  He denies palpitations, PND, orthopnea, dizziness, syncope, or early satiety.  He does sometimes note lower extremity swelling, especially if he has been sitting for long period of time.  Home Medications    Prior to Admission medications   Medication Sig Start Date End Date Taking? Authorizing Provider  amLODipine (NORVASC) 10 MG tablet Take 1 tablet (10 mg total) by mouth daily. 05/20/20   [name], MD  aspirin EC 81 MG tablet Take 81 mg by mouth every other day.    [provider]  carvedilol (COREG) 12.5 MG tablet TAKE ONE (1) TABLET BY MOUTH TWO TIMES PER DAY 07/07/20   Minna Merritts, MD  ezetimibe (ZETIA) 10 MG tablet Take  1 tablet (10 mg total) by mouth daily. 06/24/20 09/22/20  Theora Gianotti, NP  finasteride (PROSCAR) 5 MG tablet Take 1 tablet (5 mg total) by mouth daily. 03/11/20   [name], MD  gabapentin (NEURONTIN) 100 MG capsule TAKE 1 CAPSULE BY MOUTH 3 TIMES DAILY 05/28/20   [name], MD  hydrOXYzine (ATARAX/VISTARIL) 10 MG tablet Take 1 tablet (10 mg total) by mouth 3 (three) times daily as needed for up to 30 doses for itching. 05/26/20   Virgel Manifold, MD  omeprazole (PRILOSEC) 20 MG capsule TAKE 1 CAPSULE BY MOUTH TWICE DAILY 07/07/20   [name], MD  ramipril (ALTACE) 10 MG capsule Take 1 capsule (10 mg total) by mouth daily. 06/24/20 09/22/20  Theora Gianotti, NP  TOUJEO SOLOSTAR 300 UNIT/ML Solostar Pen INJECT 50 UNITS DAILY MAY INCREASE 2U EVERY 3 DAYS UNTIL FBG IS AT GOAL UP TO 80 UNITS DAILY 06/22/20    [name], MD  Vitamin D, Cholecalciferol, 1000 units TABS Take 1,000 Units by mouth daily.     [provider]  zolpidem (AMBIEN) 5 MG tablet Take 1 tablet (5 mg total) by mouth at bedtime as needed for sleep. Stop trazadone not helpful 02/26/19   [name], MD    Review of Systems    Chronic dyspnea on exertion.  Mild lower extremity edema.  He denies chest pain, palpitations, PND, orthopnea, dizziness, syncope, or early satiety.  All other systems reviewed and are otherwise negative except as noted above.  Physical Exam    VS:  BP (!) 152/62 (BP Location: Left Arm, Patient Position: Sitting, Cuff Size: Normal)   Pulse 61   Ht 6\' 1"  (1.854 m)   Wt 215 lb 6 oz (97.7 kg)   SpO2 98%   BMI 28.42 kg/m  , BMI Body mass index is 28.42 kg/m. GEN: Well nourished, well developed, in no acute distress. HEENT: normal. Neck: Supple, no JVD, carotid bruits, or masses. Cardiac: RRR, 2/6 systolic ejection murmur at the upper sternal borders, no rubs, or gallops. No clubbing, cyanosis, trace to 1+ left greater than right lower extremity/ankle edema.  Radials/PT 1+ and equal bilaterally.  Respiratory:  Respirations regular and unlabored, clear to auscultation bilaterally. GI: Soft, nontender, nondistended, BS + x 4. MS: no deformity or atrophy. Skin: warm and dry, no rash. Neuro:  Strength and sensation are intact. Psych: Normal affect.  Accessory Clinical Findings     Lab Results  Component Value Date   WBC 5.2 05/15/2020   HGB 11.5 (L) 05/15/2020   HCT 34.1 (L) 05/15/2020   MCV 94.7 05/15/2020   PLT 104 (L) 05/15/2020   Lab Results  Component Value Date   CREATININE 1.46 (H) 06/30/2020   BUN 17 06/30/2020   NA 138 06/30/2020   K 4.4 06/30/2020   CL 109 06/30/2020   CO2 22 06/30/2020   Lab Results  Component Value Date   ALT 22 07/15/2020   AST 22 07/15/2020   GGT 203 (H) 04/09/2020   ALKPHOS 208 (H) 07/15/2020   BILITOT 0.6 07/15/2020   Lab  Results  Component Value Date   CHOL 93 03/11/2020   HDL 37.60 (L) 03/11/2020   LDLCALC 23 03/11/2020   LDLDIRECT 65.0 06/02/2016   TRIG 163.0 (H) 03/11/2020   CHOLHDL 2 03/11/2020    Lab Results  Component Value Date   HGBA1C 7.8 (H) 04/08/2020    Assessment & Plan    1.  Essential  hypertension: Patient previously perampanel 10 mg twice daily in addition to Lasix 20 mg every other day both were held following hospitalization in February of this year at which time he presented with nausea, vomiting, volume depletion, and acute kidney injury.  Creatinine subsequently normalized to 1.04 March 11.  At office visit on April 20, ramipril 10 mg daily was resumed with basic metabolic panel a week later showing a creatinine of 1.46.  He was post have follow-up labs a week later to assess stability however, he developed upper respiratory infection/pharyngitis, he was diagnosed with COVID.  His pressures have been trending in the 160s and above at home.  He is chronic, stable dyspnea exertion.  Follow-up basic metabolic panel today.  In the setting of nagging cough following COVID, if renal function stable, will plan to stop ramipril and switch him to valsartan.  He will continue to follow pressures at home.  Ultimately, I suspect he will require the addition of hydralazine.    2.  Coronary artery disease: Status post CABG times 06/2007 with catheterization 2019 revealing 2 of 4 patent grafts and severe native vessel disease.  He has chronic, stable dyspnea on exertion dating back to at least 2019 but does not experience chest pain.  He is able to ride his bicycle 35 minutes 3-4 times per week without significant limitations.  Remains on aspirin, beta-blocker, calcium channel blocker therapy.  He was previously on statin but this was held in the setting of elevated LFTs.  He is on Zetia.  3.  Chronic heart failure with preserved ejection fraction/pulmonary arterial hypertension: EF 55-60% with grade 2  diastolic dysfunction noted on echo in November 2019.  Right heart catheterization at that time showed a PA pressure 51/22 (32).  He has chronic, stable dyspnea on exertion.  His weight is down 2 pounds since his last visit.  He has mild dependent edema.  He was previously on Lasix, which has been on hold since February, and he notes that he was not really taking it that often prior to that anyway.  We will continue to focus on blood pressure management for now and try to avoid diuretic therapy in the setting of kidney injury earlier this year.  4.  Hyperlipidemia: LDL of 23 in January with normalization of LFTs in May.  He remains on Zetia therapy.  Previously on statin but held since February in the setting of choledocholithiasis and LFT abnormalities.  If LFTs remain normal, can look to resume in the future.  5.  Stage III chronic kidney disease: Creatinine 1.46 on ramipril therapy in late April.  Due for follow-up today.  6.  Type 2 diabetes mellitus: A1c 7.8 in February.  Insulin management per primary care.  He is on ramipril as outlined above.  Consider dapagliflozin given HFpEF, if renal function stable going forward. . 7.  Disposition: Follow-up basic metabolic panel today with further recommendations regarding antihypertensives based on labs.  Plan to follow-up in clinic in 2 months.  [name], NP 07/22/2020, 1:36 PM

## 2020-07-22 NOTE — Assessment & Plan Note (Signed)
The patient has recovered well in general.  I discussed the cough could persist for weeks or months.  If it worsens he needs to let us know.  His cardiologist is apparently changing his ramipril in hopes that this may help with his cough.

## 2020-07-22 NOTE — Assessment & Plan Note (Signed)
The patient wants to transfer care to me regarding his diabetes.  Advised this is okay.  An A1c will be checked today.  He will continue Toujeo 45 units in the morning.

## 2020-07-22 NOTE — Patient Instructions (Signed)
Medication Instructions:  Your physician recommends that you continue on your current medications as directed. Please refer to the Current Medication list given to you today.  *If you need a refill on your cardiac medications before your next appointment, please call your pharmacy*   Lab Work:  BMET to be drawn today.     Testing/Procedures: None ordered   Follow-Up: At [facility], you and your health needs are our priority.  As part of our continuing mission to provide you with exceptional heart care, we have created designated Provider Care Teams.  These Care Teams include your primary Cardiologist (physician) and Advanced Practice Providers (APPs -  Physician Assistants and Nurse Practitioners) who all work together to provide you with the care you need, when you need it.  We recommend signing up for the patient portal called "MyChart".  Sign up information is provided on this After Visit Summary.  MyChart is used to connect with patients for Virtual Visits (Telemedicine).  Patients are able to view lab/test results, encounter notes, upcoming appointments, etc.  Non-urgent messages can be sent to your provider as well.   To learn more about what you can do with MyChart, go to [email or web address].    Your next appointment:   6 week(s)  The format for your next appointment:   In Person  Provider:   You may see Ida Rogue, MD or one of the following Advanced Practice Providers on your designated Care Team:    [name], NP  Christell Faith, PA-C  Marrianne Mood, PA-C  Cadence Gran[place], [place]  Laurann Montana, NP    Other Instructions

## 2020-07-22 NOTE — Progress Notes (Signed)
[name], MD Phone: [phone]  [name] [name] is a 87 y.o. male who presents today for f/u.  DIABETES Disease Monitoring: Blood Sugar ranges-80-129, excursion to 150 Polyuria/phagia/dipsia- no      Optho- UTD Medications: Compliance- taking toujeo 45 u daily Hypoglycemic symptoms- rarely, he will drink OJ or take a glucose tablet  Low back pain: Patient notes history of chronic back issues with an acute exacerbation over the last several days.  Notes he worked out in the yard and had gradual onset of low back discomfort with back spasms.  No radiation, numbness, weakness, incontinence, or specific injury.  He has taken Aleve with some benefit.  He has also taken Tylenol with no benefit.  In the past muscle relaxers have been helpful.  History of COVID-19: Patient notes he had COVID-19 about 3 weeks ago.  He still has some cough though overall feels quite a bit better.  He notes his cardiologist is changing his ramipril to see if that will help with the cough.   Social History   Tobacco Use  Smoking Status Never Smoker  Smokeless Tobacco Never Used    Current Outpatient Medications on File Prior to Visit  Medication Sig Dispense Refill  . amLODipine (NORVASC) 10 MG tablet Take 1 tablet (10 mg total) by mouth daily. 90 tablet 1  . aspirin EC 81 MG tablet Take 81 mg by mouth every other day.    . carvedilol (COREG) 12.5 MG tablet TAKE ONE (1) TABLET BY MOUTH TWO TIMES PER DAY 180 tablet 0  . ezetimibe (ZETIA) 10 MG tablet Take 1 tablet (10 mg total) by mouth daily. 90 tablet 3  . finasteride (PROSCAR) 5 MG tablet Take 1 tablet (5 mg total) by mouth daily. 90 tablet 1  . gabapentin (NEURONTIN) 100 MG capsule TAKE 1 CAPSULE BY MOUTH 3 TIMES DAILY 90 capsule 1  . omeprazole (PRILOSEC) 20 MG capsule TAKE 1 CAPSULE BY MOUTH TWICE DAILY 60 capsule 1  . ramipril (ALTACE) 10 MG capsule Take 1 capsule (10 mg total) by mouth daily. 90 capsule 3  . TOUJEO SOLOSTAR 300 UNIT/ML  Solostar Pen INJECT 50 UNITS DAILY MAY INCREASE 2U EVERY 3 DAYS UNTIL FBG IS AT GOAL UP TO 80 UNITS DAILY 22.5 mL 6  . Vitamin D, Cholecalciferol, 1000 units TABS Take 1,000 Units by mouth daily.     Marland Kitchen zolpidem (AMBIEN) 5 MG tablet Take 1 tablet (5 mg total) by mouth at bedtime as needed for sleep. Stop trazadone not helpful 30 tablet 0   No current facility-administered medications on file prior to visit.     ROS see history of present illness  Objective  Physical Exam Vitals:   07/22/20 1524  BP: (!) 160/60  Pulse: 66  Temp: 98.2 F (36.8 C)  SpO2: 99%    BP Readings from Last 3 Encounters:  07/22/20 (!) 160/60  07/22/20 (!) 152/62  07/15/20 129/62   Wt Readings from Last 3 Encounters:  07/22/20 214 lb 12.8 oz (97.4 kg)  07/22/20 215 lb 6 oz (97.7 kg)  07/15/20 216 lb 3.2 oz (98.1 kg)    Physical Exam Constitutional:      General: He is not in acute distress.    Appearance: He is not diaphoretic.  Cardiovascular:     Rate and Rhythm: Normal rate and regular rhythm.     Heart sounds: Normal heart sounds.  Pulmonary:     Effort: Pulmonary effort is normal.     Breath sounds: Normal  breath sounds.  Skin:    General: Skin is warm and dry.  Neurological:     Mental Status: He is alert.      Assessment/Plan: Please see individual problem list.  Problem List Items Addressed This Visit    Type 2 diabetes mellitus with complication, without long-term current use of insulin ([place]) - Primary    The patient wants to transfer care to me regarding his diabetes.  Advised this is okay.  An A1c will be checked today.  He will continue Toujeo 45 units in the morning.      Relevant Orders   POCT HgB A1C (Completed)   Chronic low back pain    Acute exacerbation of chronic back pain.  He has a benign exam.  We will prescribe baclofen to use as a muscle relaxer in the short-term.  Advised this can make him drowsy and he should not drive if he is drowsy.  If he is drowsy he  will discontinue the medication.  If his back pain is not improving over the next several weeks he will let us know.      Relevant Medications   baclofen (LIORESAL) 10 MG tablet   History of COVID-19    The patient has recovered well in general.  I discussed the cough could persist for weeks or months.  If it worsens he needs to let us know.  His cardiologist is apparently changing his ramipril in hopes that this may help with his cough.        Return in about 3 months (around 10/22/2020) for diabetes.  This visit occurred during the SARS-CoV-2 public health emergency.  Safety protocols were in place, including screening questions prior to the visit, additional usage of staff PPE, and extensive cleaning of exam room while observing appropriate contact time as indicated for disinfecting solutions.    [name], MD [place]

## 2020-07-22 NOTE — Patient Instructions (Signed)
Nice to see you. Will contact you with your A1c result. Please try the muscle relaxer.  If this makes you drowsy please do not drive and do not take any more of it.

## 2020-07-22 NOTE — Telephone Encounter (Signed)
He will need an appointment for this to evaluate to help determine the appropriate treatment regimen for him. He can be seen by any provider that has availability.

## 2020-07-22 NOTE — Telephone Encounter (Signed)
lled and spoek with patient and he is scheduled to be seen today.  [name] [name],cma

## 2020-07-22 NOTE — Assessment & Plan Note (Signed)
Acute exacerbation of chronic back pain.  He has a benign exam.  We will prescribe baclofen to use as a muscle relaxer in the short-term.  Advised this can make him drowsy and he should not drive if he is drowsy.  If he is drowsy he will discontinue the medication.  If his back pain is not improving over the next several weeks he will let us know.

## 2020-07-23 ENCOUNTER — Telehealth: Payer: Self-pay | Admitting: *Deleted

## 2020-07-23 DIAGNOSIS — R0602 Shortness of breath: Secondary | ICD-10-CM

## 2020-07-23 DIAGNOSIS — I2510 Atherosclerotic heart disease of native coronary artery without angina pectoris: Secondary | ICD-10-CM

## 2020-07-23 DIAGNOSIS — I5032 Chronic diastolic (congestive) heart failure: Secondary | ICD-10-CM

## 2020-07-23 LAB — BASIC METABOLIC PANEL
BUN/Creatinine Ratio: 12 (ref 10–24)
BUN: 16 mg/dL (ref 8–27)
CO2: 20 mmol/L (ref 20–29)
Calcium: 8.5 mg/dL — ABNORMAL LOW (ref 8.6–10.2)
Chloride: 107 mmol/L — ABNORMAL HIGH (ref 96–106)
Creatinine, Ser: 1.31 mg/dL — ABNORMAL HIGH (ref 0.76–1.27)
Glucose: 243 mg/dL — ABNORMAL HIGH (ref 65–99)
Potassium: 4.3 mmol/L (ref 3.5–5.2)
Sodium: 140 mmol/L (ref 134–144)
eGFR: 54 mL/min/{1.73_m2} — ABNORMAL LOW (ref >59)

## 2020-07-23 NOTE — Telephone Encounter (Signed)
-----   Message from Theora Gianotti, NP sent at 07/23/2020  7:32 AM EDT ----- Kidney function stable.  As we discussed yesterday, due to cough since covid and resumption of ramipril, please d/c ramipril.  Replace w/ valsartan 320mg  daily w/ f/u bmet in a week.  He should cont to follow bp @ home and give [place] some recordings w/in the next 2 wks.

## 2020-07-23 NOTE — Telephone Encounter (Signed)
Left voicemail message to call back for review of results and recommendations.  

## 2020-07-24 MED ORDER — VALSARTAN 320 MG PO TABS: 320.0000 mg | ORAL_TABLET | Freq: Every day | ORAL | 2 refills | Status: DC

## 2020-07-24 NOTE — Telephone Encounter (Signed)
Spoke with patient and reviewed results and recommendations. Instructed him to stop ramipril, start Valsartan 320 mg once daily, and have repeat labs done in one week over at the [facility]. Advised to check in at registration desk and no appointment is needed. Also discussed need for monitoring of blood pressures and to call us in 2 weeks with those readings. He verbalized understanding of all instructions, agreement with plan, and had no further questions at this time.

## 2020-08-04 ENCOUNTER — Other Ambulatory Visit: Payer: Self-pay | Admitting: Family Medicine

## 2020-08-05 DIAGNOSIS — H353211 Exudative age-related macular degeneration, right eye, with active choroidal neovascularization: Secondary | ICD-10-CM | POA: Diagnosis not present

## 2020-08-10 ENCOUNTER — Other Ambulatory Visit: Payer: Self-pay | Admitting: Family Medicine

## 2020-08-10 ENCOUNTER — Telehealth: Payer: Self-pay | Admitting: Cardiovascular Disease

## 2020-08-10 DIAGNOSIS — I10 Essential (primary) hypertension: Secondary | ICD-10-CM

## 2020-08-10 MED ORDER — HYDRALAZINE HCL 25 MG PO TABS: 25.0000 mg | ORAL_TABLET | Freq: Three times a day (TID) | ORAL | 6 refills | Status: DC

## 2020-08-10 NOTE — Telephone Encounter (Signed)
This is not a medication that I fill for the patient.  If he still having back issues he would need to be reevaluated to determine an appropriate management strategy.

## 2020-08-10 NOTE — Telephone Encounter (Signed)
    Pt c/o BP issue: STAT if pt c/o blurred vision, one-sided weakness or slurred speech  1. What are your last 5 BP readings?  6/1  178/86  51  6/2  174/93  54  6/3  165/80  58  6/4  161/76  59  6/5  171/85  63  6/6  180/94  54    2. Are you having any other symptoms (ex. Dizziness, headache, blurred vision, passed out)? No   3. What is your BP issue? Recent trend high reporting log after recent med changes

## 2020-08-10 NOTE — Telephone Encounter (Signed)
Appointment has been scheduled this wed with Dr Olivia Mackie.

## 2020-08-10 NOTE — Telephone Encounter (Signed)
Was able to reach back out to Mrs. Seawright, advised that Ignacia Bayley, NP had a chance to review his BP log and recommended  BP remains quite high. I've sent in a Rx for hydralazine 25mg  TID, #90, six refills, to [facility].  Pls have him continue to follow BPs.  Mr. Cominsky verbalized understanding, will pick up new medication today, aware to take this accordingly with his other BP meds. Will continue to monitor and keep log, will call back in one week with updated readings.

## 2020-08-10 NOTE — Telephone Encounter (Signed)
BP remains quite high. I've sent in a Rx for hydralazine 25mg  TID, #90, six refills, to [facility].  Pls have him continue to follow BPs.

## 2020-08-10 NOTE — Telephone Encounter (Signed)
Patient stated he was given muscle relaxer's from PCP 3 weeks ago, he has not having any relief.

## 2020-08-10 NOTE — Telephone Encounter (Signed)
RX Refill:norco/vicodin Last Seen:07-22-20 Last ordered:unk. Last filled 2021

## 2020-08-12 ENCOUNTER — Other Ambulatory Visit: Payer: Self-pay

## 2020-08-12 ENCOUNTER — Encounter: Payer: Self-pay | Admitting: Internal Medicine

## 2020-08-12 ENCOUNTER — Telehealth: Payer: Self-pay | Admitting: Nurse Practitioner

## 2020-08-12 ENCOUNTER — Ambulatory Visit (INDEPENDENT_AMBULATORY_CARE_PROVIDER_SITE_OTHER): Payer: Medicare Other | Admitting: Internal Medicine

## 2020-08-12 VITALS — BP 110/60 | HR 68 | Temp 98.5°F | Ht 73.0 in | Wt 213.6 lb

## 2020-08-12 DIAGNOSIS — R937 Abnormal findings on diagnostic imaging of other parts of musculoskeletal system: Secondary | ICD-10-CM | POA: Diagnosis not present

## 2020-08-12 DIAGNOSIS — M545 Low back pain, unspecified: Secondary | ICD-10-CM

## 2020-08-12 DIAGNOSIS — G8929 Other chronic pain: Secondary | ICD-10-CM

## 2020-08-12 DIAGNOSIS — I10 Essential (primary) hypertension: Secondary | ICD-10-CM | POA: Diagnosis not present

## 2020-08-12 MED ORDER — HYDROCODONE-ACETAMINOPHEN 5-325 MG PO TABS: 1.0000 | ORAL_TABLET | Freq: Three times a day (TID) | ORAL | 0 refills | Status: DC | PRN

## 2020-08-12 MED ORDER — TIZANIDINE HCL 4 MG PO TABS: 4.0000 mg | ORAL_TABLET | Freq: Every evening | ORAL | 0 refills | Status: DC | PRN

## 2020-08-12 NOTE — Progress Notes (Signed)
Chief Complaint  Patient presents with  . Back Pain   F/u with wife  1. Chronic low back pain 10/10 abnormal mri 2020 and s/p back fusion in 1989 1-2 weeks ago put out 30 bells of pine bark and flared back pain tried ice/heat wanted refill of norco 5-325 in the past told needed f/u for refill declines pain clinic referral and prior back injections x 3 caused him to see stars and steroids cause leg cramps  Tried otc pain patches w/o relieve Tried baclofen 10 mg and not helpful  2. BP elevated 6/1 to 08/12/20 before meds, 174/93 HR 54, 165/80 hr 58, 161/76 hr 59, 171/85 hr 63, 180/94 hr 54, 165/85 hr 56, 171/88 this is before mores on coreg 12.5 mg bid, hydralazine 25 tid since 08/10/20 but Monday had n/v and diovan 320 mg qd and norvasc 10 mg qd in the am today BP is controlled [facility] for this he was supposed to f/u with them has f/u sch 09/02/20  Given parameters too low <90/<60 and goal <130/<80 Has new BP machine x 1 month   Review of Systems  Gastrointestinal: Positive for nausea and vomiting.  Musculoskeletal: Positive for back pain. Negative for falls.   Past Medical History:  Diagnosis Date  . (HFpEF) heart failure with preserved ejection fraction ([place])    a. 04/1222 Echo: nl systolic fxn, mild LVH, diastolic relaxation abnormality, mildly enlarged LA, mild Ao insufficiency; 01/2018 Echo: EF 55-60%, no rwma, GR2 DD, triv AI, mildly dil LA, mild inc PASP.  Marland Kitchen BPH (benign prostatic hyperplasia)   . Cancer ([place])    skin  . Cataract   . Choledocholithiasis    a. 05/2020 ERCP w/ CBD stone removal and biliary sphincterotomy.  . Coronary artery disease    a. 01/2005 s/p CABG x 4: LIMA-LAD, VG-Diag, VG-OM3, VG-dRCA; b. 06/2013 Cath: patent grafts; c. 09/2015 Cath: VG->Diag 100, other grafts patent. LAD 100, RCA sev dzs->Med rx; d. 01/2018 Cath: LAD 100ost/p, LCX 50, RCA 100p/d, VG->OM3 100, VG->Diag 100, LIMA->LAD ok, VG->RPAV 76m-->Med rx.  Marland Kitchen COVID-19    07/2020 had paxlovid   . Dyspnea     with exertion  . Dysrhythmia   . ED (erectile dysfunction)   . Elevated LFTs    a. 05/2020 following CBD stone. 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  Marland Kitchen GERD (gastroesophageal reflux disease)   . Hyperlipidemia   . Hypertension   . IDDM (insulin dependent diabetes mellitus) 2000  . Osteoarthritis   . PAH (pulmonary artery hypertension) ([place])    a. 01/2018 RHC: PA 51/22 (32).  . Perirectal fistula   . Pneumonia 12/2016  . Shingles 09/04/2018  . Tubular adenoma of colon 07/2012  . Vertigo    Past Surgical History:  Procedure Laterality Date  . BACK SURGERY    . CARDIAC CATHETERIZATION  06/05/2013   [facility].   Marland Kitchen CARDIAC CATHETERIZATION N/A 09/24/2015   Procedure: Right Heart Cath and Coronary/Graft Angiography;  Surgeon: Minna Merritts, MD;  Location: [place] [facility];  Service: Cardiovascular;  Laterality: N/A;  . CATARACT EXTRACTION  sept 2013   right  . CATARACT EXTRACTION W/PHACO Left 03/28/2017   Procedure: CATARACT EXTRACTION PHACO AND INTRAOCULAR LENS PLACEMENT (IOC);  Surgeon: Birder Robson, MD;  Location: [facility];  Service: Ophthalmology;  Laterality: Left;  [place] 00:42.0 AP% 15.0 CDE 6.31 Fluid Pack lot # [id number]  . CHOLECYSTECTOMY  05/15/2011   Procedure: LAPAROSCOPIC CHOLECYSTECTOMY;  Surgeon:  Rolm Bookbinder, MD;  Location: [place];  Service: General;  Laterality: N/A;  . COLONOSCOPY W/ POLYPECTOMY    . CORONARY ARTERY BYPASS GRAFT  2007   x 4  . ERCP N/A 05/05/2020   Procedure: ENDOSCOPIC RETROGRADE CHOLANGIOPANCREATOGRAPHY (ERCP);  Surgeon: Lucilla Lame, MD;  Location: [facility];  Service: Endoscopy;  Laterality: N/A;  . ERCP N/A 05/12/2020   Procedure: ENDOSCOPIC RETROGRADE CHOLANGIOPANCREATOGRAPHY (ERCP);  Surgeon: Lucilla Lame, MD;  Location: [facility] ENDOSCOPY;  Service: Endoscopy;  Laterality: N/A;  . ESOPHAGOGASTRODUODENOSCOPY N/A 05/12/2020   Procedure: ESOPHAGOGASTRODUODENOSCOPY (EGD);  Surgeon: Lucilla Lame, MD;  Location: [facility] ENDOSCOPY;  Service: Endoscopy;  Laterality: N/A;  . EYE SURGERY    . FOOT SURGERY  2012   right foot  . JOINT REPLACEMENT  8/10   Right THR--[name]  . LEFT AND RIGHT HEART CATHETERIZATION WITH CORONARY/GRAFT ANGIOGRAM N/A 06/05/2013   Procedure: LEFT AND RIGHT HEART CATHETERIZATION WITH Beatrix Fetters;  Surgeon: Peter M Martinique, MD;  Location: [facility];  Service: Cardiovascular;  Laterality: N/A;  . LUMBAR LAMINECTOMY  1989  . Prostate photovaporization  5/16   Dr Budd Palmer  . RIGHT/LEFT HEART CATH AND CORONARY/GRAFT ANGIOGRAPHY N/A 01/05/2018   Procedure: RIGHT/LEFT HEART CATH AND CORONARY/GRAFT ANGIOGRAPHY;  Surgeon: Minna Merritts, MD;  Location: [place] [facility];  Service: Cardiovascular;  Laterality: N/A;   Family History  Problem Relation Age of Onset  . Lung cancer Mother   . Heart disease Father   . Colon cancer Neg Hx   . Breast cancer Neg Hx    Social History   Socioeconomic History  . Marital status: Married    Spouse name: Jan  . Number of children: 4  . Years of education: Not on file  . Highest education level: Not on file  Occupational History  . 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 Smoking status: Never Smoker  . Smokeless tobacco: Never Used  Vaping Use  . Vaping Use: Never used  Substance and Sexual Activity  . Alcohol use: Yes    Comment: occ cocktail  . Drug use: No  . Sexual activity: Not Currently  Other Topics Concern  . Not on file  Social History Narrative   Goes by [name]   Has living will    Has designated [name] as health care POA   Would accept resuscitation attempts   Would not want tube feeds if cognitively unaware   Social Determinants of Health   Financial Resource Strain: Low Risk   . Difficulty of Paying Living Expenses: Not hard at all  Food Insecurity: No Food Insecurity  . Worried About Charity fundraiser in  the Last Year: Never true  . Ran Out of Food in the Last Year: Never true  Transportation Needs: No Transportation Needs  . Lack of Transportation (Medical): No  . Lack of Transportation (Non-Medical): No  Physical Activity: Sufficiently Active  . Days of Exercise per Week: 4 days  . Minutes of Exercise per Session: 40 min  Stress: No Stress Concern Present  . Feeling of Stress : Not at all  Social Connections: Unknown  . Frequency of Communication with Friends and Family: Not on file  . Frequency of Social Gatherings with Friends and Family: Not on file  . Attends Religious Services: Not on file  . Active Member of Clubs or Organizations: Not on file  . Attends Archivist Meetings: Not on file  . Marital  Status: Married  Human resources officer Violence: Not At Risk  . Fear of Current or Ex-Partner: No  . Emotionally Abused: No  . Physically Abused: No  . Sexually Abused: No   Current Meds  Medication Sig  . amLODipine (NORVASC) 10 MG tablet TAKE 1 TABLET BY MOUTH DAILY  . aspirin EC 81 MG tablet Take 81 mg by mouth every other day.  . carvedilol (COREG) 12.5 MG tablet TAKE ONE (1) TABLET BY MOUTH TWO TIMES PER DAY  . ezetimibe (ZETIA) 10 MG tablet Take 1 tablet (10 mg total) by mouth daily.  . finasteride (PROSCAR) 5 MG tablet Take 1 tablet (5 mg total) by mouth daily.  Marland Kitchen gabapentin (NEURONTIN) 100 MG capsule TAKE 1 CAPSULE BY MOUTH 3 TIMES DAILY  . hydrALAZINE (APRESOLINE) 25 MG tablet Take 1 tablet (25 mg total) by mouth 3 (three) times daily.  Marland Kitchen HYDROcodone-acetaminophen (NORCO) 5-325 MG tablet Take 1 tablet by mouth 3 (three) times daily as needed for moderate pain.  Marland Kitchen omeprazole (PRILOSEC) 20 MG capsule TAKE 1 CAPSULE BY MOUTH TWICE DAILY  . tiZANidine (ZANAFLEX) 4 MG tablet Take 1 tablet (4 mg total) by mouth at bedtime as needed for muscle spasms.  Nelva Nay SOLOSTAR 300 UNIT/ML Solostar Pen INJECT 50 UNITS DAILY MAY INCREASE 2U EVERY 3 DAYS UNTIL FBG IS AT GOAL UP TO 80  UNITS DAILY  . valsartan (DIOVAN) 320 MG tablet Take 1 tablet (320 mg total) by mouth daily.  . Vitamin D, Cholecalciferol, 1000 units TABS Take 1,000 Units by mouth daily.   Marland Kitchen zolpidem (AMBIEN) 5 MG tablet Take 1 tablet (5 mg total) by mouth at bedtime as needed for sleep. Stop trazadone not helpful  . [DISCONTINUED] baclofen (LIORESAL) 10 MG tablet Take 1 tablet (10 mg total) by mouth 3 (three) times daily as needed for muscle spasms.   Allergies  Allergen Reactions  . Cortisone Other (See Comments)    Reaction: Leg Cramps  . Metformin Diarrhea  . Metformin And Related Diarrhea   Recent Results (from the past 2160 hour(s))  Glucose, capillary     Status: Abnormal   Collection Time: 05/14/20 12:02 PM  Result Value Ref Range   Glucose-Capillary 197 (H) 70 - 99 mg/dL    Comment: Glucose reference range applies only to samples taken after fasting for at least 8 hours.  Glucose, capillary     Status: Abnormal   Collection Time: 05/14/20  4:18 PM  Result Value Ref Range   Glucose-Capillary 240 (H) 70 - 99 mg/dL    Comment: Glucose reference range applies only to samples taken after fasting for at least 8 hours.  Glucose, capillary     Status: Abnormal   Collection Time: 05/14/20  8:43 PM  Result Value Ref Range   Glucose-Capillary 193 (H) 70 - 99 mg/dL    Comment: Glucose reference range applies only to samples taken after fasting for at least 8 hours.  CBC     Status: Abnormal   Collection Time: 05/15/20  5:36 AM  Result Value Ref Range   WBC 5.2 4.0 - 10.5 K/uL   RBC 3.60 (L) 4.22 - 5.81 MIL/uL   Hemoglobin 11.5 (L) 13.0 - 17.0 g/dL   HCT 34.1 (L) 39.0 - 52.0 %   MCV 94.7 80.0 - 100.0 fL   MCH 31.9 26.0 - 34.0 pg   MCHC 33.7 30.0 - 36.0 g/dL   RDW 14.1 11.5 - 15.5 %   Platelets 104 (L) 150 - 400 K/uL  Comment: Immature Platelet Fraction may be clinically indicated, consider ordering this additional test [id number]    nRBC 0.0 0.0 - 0.2 %    Comment: Performed at [facility], [address] [place], [place] [address]  Basic metabolic panel     Status: Abnormal   Collection Time: 05/15/20  5:36 AM  Result Value Ref Range   Sodium 135 135 - 145 mmol/L   Potassium 3.8 3.5 - 5.1 mmol/L   Chloride 109 98 - 111 mmol/L   CO2 20 (L) 22 - 32 mmol/L   Glucose, Bld 190 (H) 70 - 99 mg/dL    Comment: Glucose reference range applies only to samples taken after fasting for at least 8 hours.   BUN 14 8 - 23 mg/dL   Creatinine, Ser 1.04 0.61 - 1.24 mg/dL   Calcium 8.3 (L) 8.9 - 10.3 mg/dL   GFR, Estimated >60 >60 mL/min    Comment: (NOTE) Calculated using the CKD-EPI Creatinine Equation (2021)    Anion gap 6 5 - 15    Comment: Performed at [facility], [address], [place], [place] [address]  Magnesium     Status: None   Collection Time: 05/15/20  5:36 AM  Result Value Ref Range   Magnesium 1.8 1.7 - 2.4 mg/dL    Comment: Performed at [facility], [address] [place], [place] [address]  Phosphorus     Status: None   Collection Time: 05/15/20  5:36 AM  Result Value Ref Range   Phosphorus 3.2 2.5 - 4.6 mg/dL    Comment: ICTERUS AT THIS LEVEL MAY AFFECT RESULT Performed at [facility], [address] [place], [place] [address]   Hepatic function panel     Status: Abnormal   Collection Time: 05/15/20  5:36 AM  Result Value Ref Range   Total Protein 5.3 (L) 6.5 - 8.1 g/dL   Albumin 2.6 (L) 3.5 - 5.0 g/dL   AST 43 (H) 15 - 41 U/L   ALT 56 (H) 0 - 44 U/L   Alkaline Phosphatase 404 (H) 38 - 126 U/L   Total Bilirubin 8.2 (H) 0.3 - 1.2 mg/dL   Bilirubin, Direct 4.4 (H) 0.0 - 0.2 mg/dL   Indirect Bilirubin 3.8 (H) 0.3 - 0.9 mg/dL    Comment: Performed at [facility], [address] [place], [place] [address]  Lipase, blood     Status: None   Collection Time: 05/15/20  5:36 AM  Result Value Ref Range   Lipase 24 11 - 51 U/L    Comment: Performed at [facility], [address]  [place],  [address]  Glucose, capillary     Status: Abnormal   Collection Time: 05/15/20  8:09 AM  Result Value Ref Range   Glucose-Capillary 154 (H) 70 - 99 mg/dL    Comment: Glucose reference range applies only to samples taken after fasting for at least 8 hours.  Glucose, capillary     Status: Abnormal   Collection Time: 05/15/20 11:27 AM  Result Value Ref Range   Glucose-Capillary 183 (H) 70 - 99 mg/dL    Comment: Glucose reference range applies only to samples taken after fasting for at least 8 hours.  Hepatic function panel     Status: Abnormal   Collection Time: 05/26/20  4:13 PM  Result Value Ref Range   Total Protein 6.0 6.0 - 8.5 g/dL   Albumin 3.4 (L) 3.6 - 4.6 g/dL   Bilirubin Total  2.3 (H) 0.0 - 1.2 mg/dL   Bilirubin, Direct 1.69 (H) 0.00 - 0.40 mg/dL   Alkaline Phosphatase 450 (H) 44 - 121 IU/L   AST 42 (H) 0 - 40 IU/L   ALT 46 (H) 0 - 44 IU/L  HM DIABETES EYE EXAM     Status: None   Collection Time: 06/18/20 12:00 AM  Result Value Ref Range   HM Diabetic Eye Exam No Retinopathy No Retinopathy  Hepatic function panel     Status: Abnormal   Collection Time: 06/24/20 12:07 PM  Result Value Ref Range   Total Protein 6.1 6.0 - 8.5 g/dL   Albumin 3.7 3.6 - 4.6 g/dL   Bilirubin Total 1.3 (H) 0.0 - 1.2 mg/dL   Bilirubin, Direct 0.44 (H) 0.00 - 0.40 mg/dL   Alkaline Phosphatase 252 (H) 44 - 121 IU/L   AST 37 0 - 40 IU/L   ALT 28 0 - 44 IU/L  Basic metabolic panel     Status: Abnormal   Collection Time: 06/30/20 12:31 PM  Result Value Ref Range   Sodium 138 135 - 145 mmol/L   Potassium 4.4 3.5 - 5.1 mmol/L   Chloride 109 98 - 111 mmol/L   CO2 22 22 - 32 mmol/L   Glucose, Bld 156 (H) 70 - 99 mg/dL    Comment: Glucose reference range applies only to samples taken after fasting for at least 8 hours.   BUN 17 8 - 23 mg/dL   Creatinine, Ser 1.46 (H) 0.61 - 1.24 mg/dL   Calcium 8.7 (L) 8.9 - 10.3 mg/dL   GFR, Estimated 47 (L) >60 mL/min    Comment: (NOTE) Calculated  using the CKD-EPI Creatinine Equation (2021)    Anion gap 7 5 - 15    Comment: Performed at [facility], [address] [place], [place] [address]  Hepatic function panel     Status: Abnormal   Collection Time: 07/15/20  3:30 PM  Result Value Ref Range   Total Protein 5.9 (L) 6.0 - 8.5 g/dL   Albumin 3.8 3.6 - 4.6 g/dL   Bilirubin Total 0.6 0.0 - 1.2 mg/dL   Bilirubin, Direct 0.26 0.00 - 0.40 mg/dL   Alkaline Phosphatase 208 (H) 44 - 121 IU/L   AST 22 0 - 40 IU/L   ALT 22 0 - 44 IU/L  Basic metabolic panel     Status: Abnormal   Collection Time: 07/22/20  2:02 PM  Result Value Ref Range   Glucose 243 (H) 65 - 99 mg/dL   BUN 16 8 - 27 mg/dL   Creatinine, Ser 1.31 (H) 0.76 - 1.27 mg/dL   eGFR 54 (L) >59 mL/min/1.73   BUN/Creatinine Ratio 12 10 - 24   Sodium 140 134 - 144 mmol/L   Potassium 4.3 3.5 - 5.2 mmol/L   Chloride 107 (H) 96 - 106 mmol/L   CO2 20 20 - 29 mmol/L   Calcium 8.5 (L) 8.6 - 10.2 mg/dL  POCT HgB A1C     Status: Abnormal   Collection Time: 07/22/20  3:55 PM  Result Value Ref Range   Hemoglobin A1C 6.8 (A) 4.0 - 5.6 %   HbA1c POC (<> result, manual entry)     HbA1c, POC (prediabetic range)     HbA1c, POC (controlled diabetic range)     Objective  Body mass index is 28.18 kg/m. Wt Readings from Last 3 Encounters:  08/12/20 213 lb 9.6 oz (96.9 kg)  07/22/20 214 lb 12.8 oz (  97.4 kg)  07/22/20 215 lb 6 oz (97.7 kg)   Temp Readings from Last 3 Encounters:  08/12/20 98.5 F (36.9 C) (Oral)  07/22/20 98.2 F (36.8 C) (Oral)  07/15/20 98.4 F (36.9 C) (Oral)   BP Readings from Last 3 Encounters:  08/12/20 110/60  07/22/20 (!) 160/60  07/22/20 (!) 152/62   Pulse Readings from Last 3 Encounters:  08/12/20 68  07/22/20 66  07/22/20 61    Physical Exam Vitals reviewed.  Constitutional:      Appearance: Normal appearance. He is well-developed and well-groomed.  HENT:     Head: Normocephalic and atraumatic.  Eyes:     Conjunctiva/sclera:  Conjunctivae normal.     Pupils: Pupils are equal, round, and reactive to light.  Cardiovascular:     Rate and Rhythm: Normal rate and regular rhythm.     Heart sounds: Normal heart sounds. No murmur heard.   Pulmonary:     Effort: Pulmonary effort is normal.     Breath sounds: Normal breath sounds.  Musculoskeletal:     Lumbar back: Spasms and tenderness present. Positive right straight leg raise test and positive left straight leg raise test.       Back:  Skin:    General: Skin is warm and dry.  Neurological:     General: No focal deficit present.     Mental Status: He is alert and oriented to person, place, and time. Mental status is at baseline.     Gait: Gait normal.  Psychiatric:        Attention and Perception: Attention and perception normal.        Mood and Affect: Mood and affect normal.        Speech: Speech normal.        Behavior: Behavior normal. Behavior is cooperative.        Thought Content: Thought content normal.        Cognition and Memory: Cognition and memory normal.        Judgment: Judgment normal.     Assessment  Plan  Abnormal MRI, lumbar spine s/p lumbar fusion in 1989 - Plan: HYDROcodone-acetaminophen (NORCO) 5-325 MG tablet tid prn, tiZANidine (ZANAFLEX) 4 MG tablet qhs prn avoid with pain meds   Chronic midline low back pain without sciatica - Plan: HYDROcodone-acetaminophen (NORCO) 5-325 MG tablet, tiZANidine (ZANAFLEX) 4 MG tablet -consider chronic pain contract with PCP if PCP agreeable declines pain clinic and epidural Stop baclofen   Hypertension, controlled today [facility]c cardiology continue to monitor BP 2-3 x per day before and after meds 2 hours coreg 12.5 mg bid, hydralazine 25 tid since 08/10/20 but Monday had n/v and diovan 320 mg qd and norvasc 10 mg qd in the am today BP is controlled [facility] cardiology for this he was supposed to f/u with them has f/u sch 09/02/20  Given parameters too low <90/<60 and goal <130/<80 Has new BP machine x 1  month   Provider: Dr. Olivia Mackie McLean-Scocuzza-Internal Medicine

## 2020-08-12 NOTE — Patient Instructions (Addendum)
Consider physical therapy  Lidocaine or Salonpas pain patch    Back Exercises The following exercises strengthen the muscles that help to support the trunk and back. They also help to keep the lower back flexible. Doing these exercises can help to prevent back pain or lessen existing pain.  If you have back pain or discomfort, try doing these exercises 2-3 times each day or as told by your health care provider.  As your pain improves, do them once each day, but increase the number of times that you repeat the steps for each exercise (do more repetitions).  To prevent the recurrence of back pain, continue to do these exercises once each day or as told by your health care provider. Do exercises exactly as told by your health care provider and adjust them as directed. It is normal to feel mild stretching, pulling, tightness, or discomfort as you do these exercises, but you should stop right away if you feel sudden pain or your pain gets worse. Exercises Single knee to chest Repeat these steps 3-5 times for each leg: 1. Lie on your back on a firm bed or the floor with your legs extended. 2. Bring one knee to your chest. Your other leg should stay extended and in contact with the floor. 3. Hold your knee in place by grabbing your knee or thigh with both hands and hold. 4. Pull on your knee until you feel a gentle stretch in your lower back or buttocks. 5. Hold the stretch for 10-30 seconds. 6. Slowly release and straighten your leg. Pelvic tilt Repeat these steps 5-10 times: 1. Lie on your back on a firm bed or the floor with your legs extended. 2. Bend your knees so they are pointing toward the ceiling and your feet are flat on the floor. 3. Tighten your lower abdominal muscles to press your lower back against the floor. This motion will tilt your pelvis so your tailbone points up toward the ceiling instead of pointing to your feet or the floor. 4. With gentle tension and even breathing, hold  this position for 5-10 seconds. Cat-cow Repeat these steps until your lower back becomes more flexible: 1. Get into a hands-and-knees position on a firm surface. Keep your hands under your shoulders, and keep your knees under your hips. You may place padding under your knees for comfort. 2. Let your head hang down toward your chest. Contract your abdominal muscles and point your tailbone toward the floor so your lower back becomes rounded like the back of a cat. 3. Hold this position for 5 seconds. 4. Slowly lift your head, let your abdominal muscles relax and point your tailbone up toward the ceiling so your back forms a sagging arch like the back of a cow. 5. Hold this position for 5 seconds.   Press-ups Repeat these steps 5-10 times: 1. Lie on your abdomen (face-down) on the floor. 2. Place your palms near your head, about shoulder-width apart. 3. Keeping your back as relaxed as possible and keeping your hips on the floor, slowly straighten your arms to raise the top half of your body and lift your shoulders. Do not use your back muscles to raise your upper torso. You may adjust the placement of your hands to make yourself more comfortable. 4. Hold this position for 5 seconds while you keep your back relaxed. 5. Slowly return to lying flat on the floor.   Bridges Repeat these steps 10 times: 1. Lie on your back on a  firm surface. 2. Bend your knees so they are pointing toward the ceiling and your feet are flat on the floor. Your arms should be flat at your sides, next to your body. 3. Tighten your buttocks muscles and lift your buttocks off the floor until your waist is at almost the same height as your knees. You should feel the muscles working in your buttocks and the back of your thighs. If you do not feel these muscles, slide your feet 1-2 inches farther away from your buttocks. 4. Hold this position for 3-5 seconds. 5. Slowly lower your hips to the starting position, and allow your  buttocks muscles to relax completely. If this exercise is too easy, try doing it with your arms crossed over your chest.   Abdominal crunches Repeat these steps 5-10 times: 1. Lie on your back on a firm bed or the floor with your legs extended. 2. Bend your knees so they are pointing toward the ceiling and your feet are flat on the floor. 3. Cross your arms over your chest. 4. Tip your chin slightly toward your chest without bending your neck. 5. Tighten your abdominal muscles and slowly raise your trunk (torso) high enough to lift your shoulder blades a tiny bit off the floor. Avoid raising your torso higher than that because it can put too much stress on your low back and does not help to strengthen your abdominal muscles. 6. Slowly return to your starting position. Back lifts Repeat these steps 5-10 times: 1. Lie on your abdomen (face-down) with your arms at your sides, and rest your forehead on the floor. 2. Tighten the muscles in your legs and your buttocks. 3. Slowly lift your chest off the floor while you keep your hips pressed to the floor. Keep the back of your head in line with the curve in your back. Your eyes should be looking at the floor. 4. Hold this position for 3-5 seconds. 5. Slowly return to your starting position. Contact a health care provider if:  Your back pain or discomfort gets much worse when you do an exercise.  Your worsening back pain or discomfort does not lessen within 2 hours after you exercise. If you have any of these problems, stop doing these exercises right away. Do not do them again unless your health care provider says that you can. Get help right away if:  You develop sudden, severe back pain. If this happens, stop doing the exercises right away. Do not do them again unless your health care provider says that you can. This information is not intended to replace advice given to you by your health care provider. Make sure you discuss any questions you  have with your health care provider. Document Revised: 06/28/2018 Document Reviewed: 11/23/2017 Elsevier Patient Education  [employer].

## 2020-08-12 NOTE — Progress Notes (Signed)
Ongoing back pain. Sharp pains in back increased and did not go away 3 weeks ago. Around that time patient was lifting heavy bags.   No other changes or known injuries at the time.

## 2020-08-12 NOTE — Telephone Encounter (Signed)
   Pt has been trending BPs @ home and has been seeing numbers freq in the 170's and above.  He has only been checking pressures prior to taking his morning medications.  Hydralazine 25 mg 3 times daily was recently added to his regimen secondary to elevated pressures.  He was seen by primary care earlier today in the setting of back pain and it was noted that his systolic blood pressure was 110/60.  I reached out to [name] [name] to discuss his blood pressures and recent medication changes.  He was not symptomatic at a blood pressure of 110.  In that setting, I encouraged him to continue his current medical regimen and agreed with advice he had received earlier today, to check his blood pressure in the afternoon periodically to assess trends following morning medications.  Caller verbalized understanding and was grateful for the call back.  [name], NP 08/12/2020, 6:41 PM

## 2020-08-22 DIAGNOSIS — S91201A Unspecified open wound of right great toe with damage to nail, initial encounter: Secondary | ICD-10-CM | POA: Diagnosis not present

## 2020-08-24 ENCOUNTER — Telehealth: Payer: Self-pay | Admitting: Family Medicine

## 2020-08-24 DIAGNOSIS — S91209A Unspecified open wound of unspecified toe(s) with damage to nail, initial encounter: Secondary | ICD-10-CM

## 2020-08-24 NOTE — Telephone Encounter (Signed)
Pt went to [facility] on Saturday. He injured his big toe and he needs a referral to a Podiatrist

## 2020-08-24 NOTE — Telephone Encounter (Signed)
I called the patient and explained that the provider put in a referral for a podiatrist but it looked like the walk in clinic her went to prior did so as well and I informed him that he may get a call from both and he understood.  Kelby Adell,cma

## 2020-08-24 NOTE — Telephone Encounter (Signed)
I placed the referral.  It also looks like the physician he saw at the walk-in clinic placed a referral as well.  He may get a call from 2 different podiatrist so he should be aware of that.

## 2020-08-24 NOTE — Addendum Note (Signed)
Addended by: [name] on: 08/24/2020 12:39 PM   Modules accepted: Orders

## 2020-08-25 ENCOUNTER — Other Ambulatory Visit: Payer: Self-pay

## 2020-08-25 DIAGNOSIS — R748 Abnormal levels of other serum enzymes: Secondary | ICD-10-CM

## 2020-08-26 ENCOUNTER — Other Ambulatory Visit: Payer: Self-pay | Admitting: Gastroenterology

## 2020-08-26 DIAGNOSIS — R748 Abnormal levels of other serum enzymes: Secondary | ICD-10-CM

## 2020-08-26 LAB — HEPATIC FUNCTION PANEL
ALT: 15 IU/L (ref 0–44)
AST: 18 IU/L (ref 0–40)
Albumin: 3.7 g/dL (ref 3.6–4.6)
Alkaline Phosphatase: 180 IU/L — ABNORMAL HIGH (ref 44–121)
Bilirubin Total: 0.8 mg/dL (ref 0.0–1.2)
Bilirubin, Direct: 0.24 mg/dL (ref 0.00–0.40)
Total Protein: 5.8 g/dL — ABNORMAL LOW (ref 6.0–8.5)

## 2020-08-27 ENCOUNTER — Ambulatory Visit: Payer: Medicare Other | Admitting: Podiatry

## 2020-08-27 ENCOUNTER — Telehealth: Payer: Self-pay | Admitting: *Deleted

## 2020-08-27 ENCOUNTER — Other Ambulatory Visit: Payer: Self-pay

## 2020-08-27 ENCOUNTER — Encounter: Payer: Self-pay | Admitting: Podiatry

## 2020-08-27 DIAGNOSIS — W450XXA Nail entering through skin, initial encounter: Secondary | ICD-10-CM | POA: Diagnosis not present

## 2020-08-27 DIAGNOSIS — R197 Diarrhea, unspecified: Secondary | ICD-10-CM

## 2020-08-27 DIAGNOSIS — M79673 Pain in unspecified foot: Secondary | ICD-10-CM | POA: Diagnosis not present

## 2020-08-27 NOTE — Progress Notes (Signed)
This patient presents the office today stating that his right great toenail has evulsed and is attached minimally at the base of the toenail.  He states he initially injured it taking off his shoes weeks ago and thought the nail would fall off on its own.  He says it was starting to be a problem and he went to the [facility] emergency department and they put him on an antibiotic and told him to make a follow-up appointment with podiatry.  He presents the office today stating that the nail is painful but there is been no drainage or infection from the nail.  He says that he has been applying an antibiotic spray to the toenail area. He presents the office today for an evaluation and treatment of this avulsed nail and is concerned due to the fact that he is diabetic.  Vascular  Dorsalis pedis and posterior tibial pulses are palpable  B/L.  Capillary return  WNL.  Temperature gradient is  WNL.  Skin turgor  WNL  Sensorium  Senn Weinstein monofilament wire  WNL. Normal tactile sensation.  Nail Exam  Patient has normal nails with no evidence of bacterial or fungal infection.  His right great toenail plate is unattached from the nailbed..  No drainage or infection noted.  Orthopedic  Exam  Muscle tone and muscle strength  WNL.  No limitations of motion feet  B/L.  No crepitus or joint effusion noted.  Foot type is unremarkable and digits show no abnormalities.  Bony prominences are unremarkable.  Skin  No open lesions.  Normal skin texture and turgor.   Nail Injury right hallux nail plate.  IE>  Removal of nail plate from right hallux.  Bandage was applied.  Told him to soak at home and/or use peroxide.  RTC prn.   Gardiner Barefoot DPM

## 2020-08-27 NOTE — Progress Notes (Signed)
Attempted to contact patient. No answer. LVM for patient to return call.  

## 2020-08-27 NOTE — Telephone Encounter (Signed)
Called to let patient know about lab results. No answer. LVM for patient to return call

## 2020-08-27 NOTE — Telephone Encounter (Addendum)
Called to let patient know about below. No answer. LVM for patient to return call   ----- Message from Virgel Manifold, MD sent at 08/26/2020  4:13 PM EDT ----- Please let the patient know that his liver enzymes have continued to improve.  However, it is still somewhat elevated, although much improved from before.  I would like to repeat it again in 1 month and I have ordered repeat labs.  Please advise the patient to get this done at that time.  If he has any new symptoms in the meantime, he should call us back.

## 2020-09-01 NOTE — Telephone Encounter (Signed)
Patient would like a call back to discuss results and questions

## 2020-09-01 NOTE — Telephone Encounter (Signed)
See result note.  

## 2020-09-02 ENCOUNTER — Encounter: Payer: Self-pay | Admitting: Cardiovascular Disease

## 2020-09-02 ENCOUNTER — Ambulatory Visit: Payer: Medicare Other | Admitting: Cardiovascular Disease

## 2020-09-02 ENCOUNTER — Other Ambulatory Visit: Payer: Self-pay

## 2020-09-02 VITALS — BP 132/58 | HR 57 | Ht 72.0 in | Wt 216.5 lb

## 2020-09-02 DIAGNOSIS — N183 Chronic kidney disease, stage 3 unspecified: Secondary | ICD-10-CM

## 2020-09-02 DIAGNOSIS — E785 Hyperlipidemia, unspecified: Secondary | ICD-10-CM | POA: Diagnosis not present

## 2020-09-02 DIAGNOSIS — Z951 Presence of aortocoronary bypass graft: Secondary | ICD-10-CM

## 2020-09-02 DIAGNOSIS — I5032 Chronic diastolic (congestive) heart failure: Secondary | ICD-10-CM | POA: Diagnosis not present

## 2020-09-02 DIAGNOSIS — I10 Essential (primary) hypertension: Secondary | ICD-10-CM | POA: Diagnosis not present

## 2020-09-02 DIAGNOSIS — I2721 Secondary pulmonary arterial hypertension: Secondary | ICD-10-CM

## 2020-09-02 DIAGNOSIS — E118 Type 2 diabetes mellitus with unspecified complications: Secondary | ICD-10-CM | POA: Diagnosis not present

## 2020-09-02 DIAGNOSIS — I25118 Atherosclerotic heart disease of native coronary artery with other forms of angina pectoris: Secondary | ICD-10-CM

## 2020-09-02 MED ORDER — DOXAZOSIN MESYLATE 2 MG PO TABS: 2.0000 mg | ORAL_TABLET | Freq: Two times a day (BID) | ORAL | 6 refills | Status: DC

## 2020-09-02 NOTE — Progress Notes (Signed)
I connected with  [name] [name] on 09/02/20   Evaluation Performed:  Follow-up visit  Date:  09/02/2020   ID:  [name] [name], DOB [birth year], MRN [id number]  Patient Location:  [address] [id number]   Provider location:   [facility] [place] office  PCP:  [name], MD  Cardiologist:  Arvid Right [facility]   Chief Complaint  Patient presents with   6 week follow up     "Doing well." Medications reviewed by the patient verbally.     History of Present Illness:    [name] [name] is a 87 y.o. male  past medical history of coronary artery disease, bypass surgery in November 2006, Stress test March 2017, cath 09/2015, medical management Occluded proximal LAD  Critical/severe native RCA disease  Vein graft to the diagonal is occluded  Other vein grafts patent to the RCA/PDA, vein graft to the OM 3, LIMA graft  EF 55% in 12/2013 diabetes, numbers running high obesity,  hyperlipidemia,  hypertension,  erectile dysfunction Chronic SOB, nonsmoker Postherpetic neuralgia left flank Stable angina, chronic diastolic CHF, chronic shortness of breath Pulmonary hypertension on right heart catheterization who presents for routine followup of his coronary artery disease, and hypertension  Last seen in clinic February 2022 Seen by one of our providers May 2022 Wife presents with him today  05/2020: in hospital , [facility] records reviewed  COVID April 2022 treated with paxlovid  On today's visit reports having chronic shortness of breath No significant change from prior visits Denies significant leg swelling No regular exercise program,  Blood pressure running high at home typically 785 up to 885 systolic Compliant with his medications  Worsening leg swelling Reviewed medications, amlodipine increased from 5 up to 10 over the past year  EKG personally reviewed by myself on todays visit NSR rate 57 BPM no  significant ST-T wave changes  Other past medical history reviewed Prior echocardiogram in heart catheterization November 2019 Moderate pulmonary hypertension, Severe three-vessel disease Occluded proximal RCA, occluded ostial LAD LIMA graft to the LAD patent, vein graft to the PDA patent The findings this catheterization shows occluded vein graft to the OM 3, now occluded proximal native RCA  In the hospital February 2022 right upper quadrant abdominal pain with associated nausea and vomiting as well as fever with a T-max of one 1.5 for the last 3 to 4 days with associated generalized fatigue and tiredness.   cholestasis with elevated alkaline phosphatase and direct hyperbilirubinemia GGT elevated more than 200 which is consistent with hepatic origin of elevated alkaline phosphatase Acute hepatitis panel negative  Echocardiogram January 08, 2018 Left ventricle: The cavity size was normal. There was mild   concentric hypertrophy. Systolic function was normal. The   estimated ejection fraction was in the range of 55% to 60%. Wall   motion was normal; there were no regional wall motion   abnormalities. Features are consistent with a pseudonormal left   ventricular filling pattern, with concomitant abnormal relaxation   and increased filling pressure (grade 2 diastolic dysfunction).   Past Medical History:  Diagnosis Date   (HFpEF) heart failure with preserved ejection fraction ([place])    a. 0/2774 Echo: nl systolic fxn, mild LVH, diastolic relaxation abnormality, mildly enlarged LA, mild Ao insufficiency; 01/2018 Echo: EF 55-60%, no rwma, GR2 DD, triv AI, mildly dil LA, mild inc PASP.   BPH (benign prostatic hyperplasia)    Cancer ([place])  skin   Cataract    Choledocholithiasis    a. 05/2020 ERCP w/ CBD stone removal and biliary sphincterotomy.   Coronary artery disease    a. 01/2005 s/p CABG x 4: LIMA-LAD, VG-Diag, VG-OM3, VG-dRCA; b. 06/2013 Cath: patent grafts; c. 09/2015 Cath:  VG->Diag 100, other grafts patent. LAD 100, RCA sev dzs->Med rx; d. 01/2018 Cath: LAD 100ost/p, LCX 50, RCA 100p/d, VG->OM3 100, VG->Diag 100, LIMA->LAD ok, VG->RPAV 34m-->Med rx.   COVID-19    07/2020 had paxlovid    Dyspnea    with exertion   Dysrhythmia    ED (erectile dysfunction)    Elevated LFTs    a. 05/2020 following CBD stone. 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   GERD (gastroesophageal reflux disease)    Hyperlipidemia    Hypertension    IDDM (insulin dependent diabetes mellitus) 2000   Osteoarthritis    PAH (pulmonary artery hypertension) ([place])    a. 01/2018 RHC: PA 51/22 (32).   Perirectal fistula    Pneumonia 12/2016   Shingles 09/04/2018   Tubular adenoma of colon 07/2012   Vertigo    Past Surgical History:  Procedure Laterality Date   BACK SURGERY     CARDIAC CATHETERIZATION  06/05/2013   [facility]    CARDIAC CATHETERIZATION N/A 09/24/2015   Procedure: Right Heart Cath and Coronary/Graft Angiography;  Surgeon: Minna Merritts, MD;  Location: [place] [facility];  Service: Cardiovascular;  Laterality: N/A;   CATARACT EXTRACTION  sept 2013   right   CATARACT EXTRACTION W/PHACO Left 03/28/2017   Procedure: CATARACT EXTRACTION PHACO AND INTRAOCULAR LENS PLACEMENT (IOC);  Surgeon: Birder Robson, MD;  Location: [facility] ORS;  Service: Ophthalmology;  Laterality: Left;  [place] 00:42.0 AP% 15.0 CDE 6.31 Fluid Pack lot # [id number]   CHOLECYSTECTOMY  05/15/2011   Procedure: LAPAROSCOPIC CHOLECYSTECTOMY;  Surgeon: Rolm Bookbinder, MD;  Location: [place];  Service: General;  Laterality: N/A;   COLONOSCOPY W/ POLYPECTOMY     CORONARY ARTERY BYPASS GRAFT  2007   x 4   ERCP N/A 05/05/2020   Procedure: ENDOSCOPIC RETROGRADE CHOLANGIOPANCREATOGRAPHY (ERCP);  Surgeon: Lucilla Lame, MD;  Location: [facility] ENDOSCOPY;  Service: Endoscopy;  Laterality: N/A;   ERCP N/A 05/12/2020   Procedure: ENDOSCOPIC RETROGRADE CHOLANGIOPANCREATOGRAPHY (ERCP);   Surgeon: Lucilla Lame, MD;  Location: [facility] ENDOSCOPY;  Service: Endoscopy;  Laterality: N/A;   ESOPHAGOGASTRODUODENOSCOPY N/A 05/12/2020   Procedure: ESOPHAGOGASTRODUODENOSCOPY (EGD);  Surgeon: Lucilla Lame, MD;  Location: [facility] ENDOSCOPY;  Service: Endoscopy;  Laterality: N/A;   EYE SURGERY     FOOT SURGERY  2012   right foot   JOINT REPLACEMENT  8/10   Right THR--[name]   LEFT AND RIGHT HEART CATHETERIZATION WITH CORONARY/GRAFT ANGIOGRAM N/A 06/05/2013   Procedure: LEFT AND RIGHT HEART CATHETERIZATION WITH Beatrix Fetters;  Surgeon: Peter M Martinique, MD;  Location: [facility] CATH LAB;  Service: Cardiovascular;  Laterality: N/A;   LUMBAR LAMINECTOMY  1989   Prostate photovaporization  5/16   Dr Budd Palmer   RIGHT/LEFT HEART CATH AND CORONARY/GRAFT ANGIOGRAPHY N/A 01/05/2018   Procedure: RIGHT/LEFT HEART CATH AND CORONARY/GRAFT ANGIOGRAPHY;  Surgeon: Minna Merritts, MD;  Location: [place] [facility];  Service: Cardiovascular;  Laterality: N/A;     Current Meds  Medication Sig   amLODipine (NORVASC) 10 MG tablet TAKE 1 TABLET BY MOUTH DAILY   aspirin EC 81 MG tablet Take 81 mg by mouth every other day.   carvedilol (COREG) 12.5 MG tablet TAKE ONE (  1) TABLET BY MOUTH TWO TIMES PER DAY   ezetimibe (ZETIA) 10 MG tablet Take 10 mg by mouth daily.   finasteride (PROSCAR) 5 MG tablet Take 1 tablet (5 mg total) by mouth daily.   gabapentin (NEURONTIN) 100 MG capsule TAKE 1 CAPSULE BY MOUTH 3 TIMES DAILY   hydrALAZINE (APRESOLINE) 25 MG tablet Take 1 tablet (25 mg total) by mouth 3 (three) times daily.   HYDROcodone-acetaminophen (NORCO) 5-325 MG tablet Take 1 tablet by mouth 3 (three) times daily as needed for moderate pain.   omeprazole (PRILOSEC) 20 MG capsule TAKE 1 CAPSULE BY MOUTH TWICE DAILY   tiZANidine (ZANAFLEX) 4 MG tablet Take 1 tablet (4 mg total) by mouth at bedtime as needed for muscle spasms.   TOUJEO SOLOSTAR 300 UNIT/ML Solostar Pen INJECT 50 UNITS DAILY MAY INCREASE 2U  EVERY 3 DAYS UNTIL FBG IS AT GOAL UP TO 80 UNITS DAILY   valsartan (DIOVAN) 320 MG tablet Take 1 tablet (320 mg total) by mouth daily.   Vitamin D, Cholecalciferol, 1000 units TABS Take 1,000 Units by mouth daily.    zolpidem (AMBIEN) 5 MG tablet Take 1 tablet (5 mg total) by mouth at bedtime as needed for sleep. Stop trazadone not helpful     Allergies:   Cortisone, Metformin, and Metformin and related   Social History   Tobacco Use   Smoking status: Never   Smokeless tobacco: Never  Vaping Use   Vaping Use: Never used  Substance Use Topics   Alcohol use: Yes    Comment: occ cocktail   Drug use: No     Family Hx: The patient's family history includes Heart disease in his father; Lung cancer in his mother. There is no history of Colon cancer or Breast cancer.  ROS:   Please see the history of present illness.    Review of Systems  Constitutional: Negative.   HENT: Negative.    Respiratory:  Positive for shortness of breath.   Cardiovascular:  Positive for leg swelling.  Gastrointestinal: Negative.   Musculoskeletal: Negative.   Neurological: Negative.   Psychiatric/Behavioral: Negative.    All other systems reviewed and are negative.   Labs/Other Tests and Data Reviewed:    Recent Labs: 05/15/2020: Hemoglobin 11.5; Magnesium 1.8; Platelets 104 07/22/2020: BUN 16; Creatinine, Ser 1.31; Potassium 4.3; Sodium 140 08/25/2020: ALT 15   Recent Lipid Panel Lab Results  Component Value Date/Time   CHOL 93 03/11/2020 03:25 PM   CHOL 125 12/27/2017 02:46 PM   TRIG 163.0 (H) 03/11/2020 03:25 PM   TRIG 289 (H) 12/27/2017 02:46 PM   HDL 37.60 (L) 03/11/2020 03:25 PM   HDL 39 (L) 07/04/2014 08:01 AM   CHOLHDL 2 03/11/2020 03:25 PM   LDLCALC 23 03/11/2020 03:25 PM   LDLCALC 110 (H) 07/04/2014 08:01 AM   LDLDIRECT 65.0 06/02/2016 02:17 PM    Wt Readings from Last 3 Encounters:  09/02/20 216 lb 8 oz (98.2 kg)  08/12/20 213 lb 9.6 oz (96.9 kg)  07/22/20 214 lb 12.8 oz (97.4  kg)     Exam:    BP (!) 132/58 (BP Location: Left Arm, Patient Position: Sitting, Cuff Size: Normal)   Pulse (!) 57   Ht 6' (1.829 m)   Wt 216 lb 8 oz (98.2 kg)   SpO2 98%   BMI 29.36 kg/m   Constitutional:  oriented to person, place, and time. No distress.  HENT:  Head: Grossly normal Eyes:  no discharge. No scleral icterus.  Neck:  No JVD, no carotid bruits  Cardiovascular: Regular rate and rhythm, no murmurs appreciated Pulmonary/Chest: Clear to auscultation bilaterally, no wheezes or rails Abdominal: Soft.  no distension.  no tenderness.  Musculoskeletal: Normal range of motion Neurological:  normal muscle tone. Coordination normal. No atrophy Skin: Skin warm and dry Psychiatric: normal affect, pleasant   ASSESSMENT & PLAN:    Pulmonary hypertension Previous PFTs, obstructive disease,  Chronic mild shortness of breath Appears his Lasix was held in the setting of renal dysfunction several months ago and not restarted for weight gain.  We will hold off for now, recommend he try not to eat out at restaurants as much, avoid high fluid salt intake Continue to work on weight loss  Coronary artery disease of native artery of native heart with stable angina pectoris (HCC) Chronic stable angina, Continued shortness of breath,  Recommend regular walking program, diet restriction  Chronic diastolic CHF (congestive heart failure) ([place]) May need to restart his Lasix Was taking 20 mg for weight over 210 pounds Recommend he moderate his fluid intake, Low-salt, weight loss  Leg swelling Likely exacerbated by amlodipine Recommend he stop the amlodipine, Changes to medications as below   Type 2 diabetes mellitus with complication, without long-term current use of insulin (HCC) weight loss, strict low carbohydrate diet  Mixed hyperlipidemia Cholesterol is at goal on the current lipid regimen. No changes to the medications were made.  Essential hypertension Blood pressure  elevated, recommend he stop amlodipine given leg swelling Suggest he start Cardura 2 mg twice daily Recommend he call us with blood pressure measurements  Stable angina (HCC) Continue current medications Cholesterol at goal  S/P CABG x 4 Prior catheterization in the 2019  Denies anginal symptoms  CKD (chronic kidney disease) stage 3, GFR 30-59 ml/min ([place]) May need to restart his Lasix 20 mg for weight over 210 pounds Will hold off for now   Total encounter time more than 25 minutes  Greater than 50% was spent in counseling and coordination of care with the patient    Signed, Ida Rogue, MD  09/02/2020 2:30 PM    [facility] [address], [place], [place] [address]

## 2020-09-02 NOTE — Patient Instructions (Addendum)
Hold the zetia for a few weeks to see if stomach gets better Try probiotics  Medication Instructions:  Hold the amlodipine, this can cause leg swelling  Please start cardura 1 mg twice a day  Monitor blood pressure, If above 140 on a regular basis, call the office  If you need a refill on your cardiac medications before your next appointment, please call your pharmacy.    Lab work: No new labs needed   If you have labs (blood work) drawn today and your tests are completely normal, you will receive your results only by: [place] (if you have MyChart) OR A paper copy in the mail If you have any lab test that is abnormal or we need to change your treatment, we will call you to review the results.   Testing/Procedures: No new testing needed   Follow-Up: At [facility], you and your health needs are our priority.  As part of our continuing mission to provide you with exceptional heart care, we have created designated Provider Care Teams.  These Care Teams include your primary Cardiologist (physician) and Advanced Practice Providers (APPs -  Physician Assistants and Nurse Practitioners) who all work together to provide you with the care you need, when you need it.  You will need a follow up appointment in 6 months  Providers on your designated Care Team:   [name], NP Christell Faith, PA-C Marrianne Mood, PA-C  Any Other Special Instructions Will Be Listed Below (If Applicable).  COVID-19 Vaccine Information can be found at: ShippingScam.co.uk For questions related to vaccine distribution or appointments, please email [email or web address] or call [phone].

## 2020-09-03 ENCOUNTER — Telehealth: Payer: Self-pay | Admitting: Gastroenterology

## 2020-09-03 NOTE — Telephone Encounter (Signed)
Patient states he has left "multiple messages" since [facility] with no return call.  He is having severe diarrhea and unable to leave his home.  He wants call back asap please.

## 2020-09-03 NOTE — Telephone Encounter (Signed)
Pt reports that he continues to have loose stools since April 3-4 times daily average... denies abd pain/bloating, denies dark or blood in stools... deniesN/V... pt would like advice on what he should do...Marland Kitchen please advise

## 2020-09-03 NOTE — Telephone Encounter (Signed)
Patient having chronic diarrhea for several days.  Patient is frustrated and needs a call back to advise. Patient says his condition is getting worse everyday.

## 2020-09-04 ENCOUNTER — Other Ambulatory Visit: Payer: Self-pay

## 2020-09-04 DIAGNOSIS — R197 Diarrhea, unspecified: Secondary | ICD-10-CM

## 2020-09-04 NOTE — Telephone Encounter (Signed)
Pt will go to [place] station to pick up supplies and complete test... order placed and released... pt reported that it has been about 1 month

## 2020-09-04 NOTE — Addendum Note (Signed)
Addended by: Lurlean Nanny on: 09/04/2020 11:07 AM   Modules accepted: Orders

## 2020-09-10 ENCOUNTER — Telehealth: Payer: Self-pay | Admitting: Cardiovascular Disease

## 2020-09-10 NOTE — Telephone Encounter (Signed)
Pt c/o swelling: STAT is pt has developed SOB within 24 hours  If swelling, where is the swelling located? Feet and ankles   How much weight have you gained and in what time span? 8 pounds in a 4 day time period  Have you gained 3 pounds in a day or 5 pounds in a week? Yes, 5 pounds in a week  Do you have a log of your daily weights (if so, list)? Normal weight 210-212 , now 220  Are you currently taking a fluid pill? No, kidney doctor took him off - but did take one this morning   Are you currently SOB? No - not more than normal   Have you traveled recently? Yes, to the beach

## 2020-09-11 NOTE — Telephone Encounter (Signed)
Patient returning call.

## 2020-09-11 NOTE — Telephone Encounter (Signed)
Attempted to reach out to [name] [name], unable to make contact via phone, left message advising to call back.

## 2020-09-11 NOTE — Telephone Encounter (Signed)
Reach back out to [name] [name], reports went out of town for 4 days [place] After [place] he has gained 8 lbs and slightly swollen around the ankle more then usual. Pt reports is not on any fluid pills at this time, his nephrology took him off of it some time back.  Pt reports still has Torsemide 20 mg on hand, took one tab yesterday with a potassium. [name] [name] wanted to know if he should take another to help get the weight back off.   Reviewed pt's recent labs from May 2022,  BUN 16, Creatinine 1.31 K+ 4.3. Advised over all renal function appeared ok, slightly dry, advised could try Torsemide 20 mg daily over the weekend with potassium to see if fluid pulls off since he still has left over pills. Remove any salt intake and elevated legs when possible. If weight has not improved and edema not improved to call back Monday morning. Pt verbalized understanding. Pt denies increase sob or CP. Hx of sob and leg swelling. Positive for both at last visit 6/29.   Alerted pt will route message to Dr. Rockey Situ for review to see what he recommends, but for now, use his torsemide over the weekend to see if weight improves, he will call Monday if no improvement.   [name] [name] thankful for the call and advice, nothing further at this time.

## 2020-09-14 DIAGNOSIS — R197 Diarrhea, unspecified: Secondary | ICD-10-CM | POA: Diagnosis not present

## 2020-09-14 DIAGNOSIS — H353211 Exudative age-related macular degeneration, right eye, with active choroidal neovascularization: Secondary | ICD-10-CM | POA: Diagnosis not present

## 2020-09-16 ENCOUNTER — Telehealth: Payer: Self-pay

## 2020-09-16 ENCOUNTER — Ambulatory Visit: Payer: Medicare Other | Admitting: Family Medicine

## 2020-09-16 LAB — GI PROFILE, STOOL, PCR
Adenovirus F 40/41: NOT DETECTED
Astrovirus: NOT DETECTED
C difficile toxin A/B: DETECTED — AB
Campylobacter: NOT DETECTED
Cryptosporidium: NOT DETECTED
Cyclospora cayetanensis: NOT DETECTED
E coli O157: NOT DETECTED
Entamoeba histolytica: NOT DETECTED
Enteroaggregative E coli: NOT DETECTED
Enteropathogenic E coli: NOT DETECTED
Enterotoxigenic E coli: NOT DETECTED
Giardia lamblia: NOT DETECTED
Norovirus GI/GII: NOT DETECTED
Plesiomonas shigelloides: NOT DETECTED
Rotavirus A: NOT DETECTED
Salmonella: NOT DETECTED
Sapovirus: NOT DETECTED
Shiga-toxin-producing E coli: NOT DETECTED
Shigella/Enteroinvasive E coli: NOT DETECTED
Vibrio cholerae: NOT DETECTED
Vibrio: NOT DETECTED
Yersinia enterocolitica: NOT DETECTED

## 2020-09-16 MED ORDER — VANCOMYCIN HCL 125 MG PO CAPS: 125.0000 mg | ORAL_CAPSULE | Freq: Four times a day (QID) | ORAL | 0 refills | Status: DC

## 2020-09-16 NOTE — Telephone Encounter (Signed)
[facility] is calling for patient because the Vancomycin is a 100 dollar copay even with out  insurance it is 93.98 cash price. Patient would like it change to a cheaper medication

## 2020-09-16 NOTE — Telephone Encounter (Signed)
[facility] called back and states that the patient came back in and he is leaving for vacation. He states he is going to pay for the medication

## 2020-09-16 NOTE — Telephone Encounter (Signed)
Pt has already picked up Rx Nothing further needed

## 2020-09-21 ENCOUNTER — Ambulatory Visit: Payer: Medicare Other | Admitting: Family Medicine

## 2020-09-24 ENCOUNTER — Telehealth: Payer: Self-pay | Admitting: Gastroenterology

## 2020-09-24 ENCOUNTER — Other Ambulatory Visit: Payer: Self-pay | Admitting: Physician Assistant

## 2020-09-24 NOTE — Telephone Encounter (Signed)
Patient lvm and vancomycin (VANCOCIN) 125 MG capsule is not working. What else can he take?

## 2020-09-25 ENCOUNTER — Other Ambulatory Visit: Payer: Self-pay

## 2020-09-25 ENCOUNTER — Telehealth (INDEPENDENT_AMBULATORY_CARE_PROVIDER_SITE_OTHER): Payer: Medicare Other | Admitting: Gastroenterology

## 2020-09-25 ENCOUNTER — Other Ambulatory Visit: Payer: Self-pay | Admitting: Gastroenterology

## 2020-09-25 DIAGNOSIS — R197 Diarrhea, unspecified: Secondary | ICD-10-CM | POA: Diagnosis not present

## 2020-09-25 MED ORDER — COLESTIPOL HCL 1 G PO TABS: 2.0000 g | ORAL_TABLET | Freq: Two times a day (BID) | ORAL | 1 refills | Status: DC

## 2020-09-25 NOTE — Progress Notes (Signed)
[name] Antigua, MD [address]  [place]  [place], [place] [address]  Main: [phone]  Fax: [phone]   Primary Care Physician: [name], MD  Virtual Visit via Telephone Note  I connected with patient on 09/25/20 at 10:00 AM EDT by telephone and verified that I am speaking with the correct person using two identifiers.   I discussed the limitations, risks, security and privacy concerns of performing an evaluation and management service by telephone and the availability of in person appointments. I also discussed with the patient that there may be a patient responsible charge related to this service. The patient expressed understanding and agreed to proceed.  Location of Patient: Home Location of Provider: Home Persons involved: Patient and provider only during the visit (nursing staff and front desk staff was involved in communicating with the patient prior to the appointment, reviewing medications and checking them in)   History of Present Illness: [id number]  HPI: [name] [name] is a 87 y.o. male here for follow-up of diarrhea.  Due to positive C. difficile on stool test and his diarrhea, vancomycin orally was prescribed on 09/16/2020.  Patient states he has been taking it since then has another 1 to 2 days of the medication left.  However, he states that he continues to have diarrhea.  Describes having 4 loose bowel movements in the morning.  No abdominal pain, fever or chills.  No nausea or vomiting.  Good appetite.  No weight loss.  No blood in stool.  Current Outpatient Medications  Medication Sig Dispense Refill   aspirin EC 81 MG tablet Take 81 mg by mouth every other day.     carvedilol (COREG) 12.5 MG tablet TAKE ONE (1) TABLET BY MOUTH TWO TIMES PER DAY 180 tablet 0   colestipol (COLESTID) 1 g tablet Take 2 tablets (2 g total) by mouth 2 (two) times daily. 120 tablet 1   doxazosin (CARDURA) 2 MG tablet Take 1 tablet (2 mg total) by mouth 2  (two) times daily. 60 tablet 6   ezetimibe (ZETIA) 10 MG tablet Take 10 mg by mouth daily.     finasteride (PROSCAR) 5 MG tablet Take 1 tablet (5 mg total) by mouth daily. 90 tablet 1   gabapentin (NEURONTIN) 100 MG capsule TAKE 1 CAPSULE BY MOUTH 3 TIMES DAILY 90 capsule 1   hydrALAZINE (APRESOLINE) 25 MG tablet Take 1 tablet (25 mg total) by mouth 3 (three) times daily. 90 tablet 6   HYDROcodone-acetaminophen (NORCO) 5-325 MG tablet Take 1 tablet by mouth 3 (three) times daily as needed for moderate pain. 90 tablet 0   omeprazole (PRILOSEC) 20 MG capsule TAKE 1 CAPSULE BY MOUTH TWICE DAILY 60 capsule 1   tiZANidine (ZANAFLEX) 4 MG tablet Take 1 tablet (4 mg total) by mouth at bedtime as needed for muscle spasms. 30 tablet 0   TOUJEO SOLOSTAR 300 UNIT/ML Solostar Pen INJECT 50 UNITS DAILY MAY INCREASE 2U EVERY 3 DAYS UNTIL FBG IS AT GOAL UP TO 80 UNITS DAILY 22.5 mL 6   valsartan (DIOVAN) 320 MG tablet TAKE ONE TABLET BY MOUTH EVERY DAY 30 tablet 5   vancomycin (VANCOCIN) 125 MG capsule Take 1 capsule (125 mg total) by mouth 4 (four) times daily. 40 capsule 0   Vitamin D, Cholecalciferol, 1000 units TABS Take 1,000 Units by mouth daily.      zolpidem (AMBIEN) 5 MG tablet Take 1 tablet (5 mg total) by mouth at bedtime as needed for sleep.  Stop trazadone not helpful 30 tablet 0   No current facility-administered medications for this visit.    Allergies as of 09/25/2020 - Review Complete 09/02/2020  Allergen Reaction Noted   Cortisone Other (See Comments) 06/24/2016   Metformin Diarrhea 09/05/2016   Metformin and related Diarrhea 09/05/2016    Review of Systems:    All systems reviewed and negative except where noted in HPI.   Observations/Objective:  Labs: CMP     Component Value Date/Time   NA 140 07/22/2020 1402   NA 139 02/22/2013 1654   K 4.3 07/22/2020 1402   K 4.5 02/22/2013 1654   CL 107 (H) 07/22/2020 1402   CL 109 (H) 02/22/2013 1654   CO2 20 07/22/2020 1402   CO2 24  02/22/2013 1654   GLUCOSE 243 (H) 07/22/2020 1402   GLUCOSE 156 (H) 06/30/2020 1231   GLUCOSE 197 (H) 02/22/2013 1654   BUN 16 07/22/2020 1402   BUN 20 (H) 02/22/2013 1654   CREATININE 1.31 (H) 07/22/2020 1402   CREATININE 1.18 02/22/2013 1654   CREATININE 1.13 05/25/2012 1630   CALCIUM 8.5 (L) 07/22/2020 1402   CALCIUM 9.1 02/22/2013 1654   PROT 5.8 (L) 08/25/2020 1144   PROT 6.2 (L) 02/22/2013 1654   ALBUMIN 3.7 08/25/2020 1144   ALBUMIN 3.5 02/22/2013 1654   AST 18 08/25/2020 1144   AST 20 02/22/2013 1654   ALT 15 08/25/2020 1144   ALT 42 02/22/2013 1654   ALKPHOS 180 (H) 08/25/2020 1144   ALKPHOS 113 02/22/2013 1654   BILITOT 0.8 08/25/2020 1144   BILITOT 0.6 02/22/2013 1654   GFRNONAA 47 (L) 06/30/2020 1231   GFRNONAA 59 (L) 02/22/2013 1654   GFRAA 53 (L) 04/27/2020 0930   GFRAA >60 02/22/2013 1654   Lab Results  Component Value Date   WBC 5.2 05/15/2020   HGB 11.5 (L) 05/15/2020   HCT 34.1 (L) 05/15/2020   MCV 94.7 05/15/2020   PLT 104 (L) 05/15/2020    Imaging Studies: No results found.  Assessment and Plan:   [name] [name] is a 87 y.o. y/o male here for follow-up of diarrhea and positive C. difficile  Assessment and Plan: Patient is still completing treatment of his C. difficile with oral vancomycin and is taking it appropriately  He does not have any alarm symptoms at this time but does have continued diarrhea.  He is toward the end of his 10-day treatment with oral vancomycin  Literature suggests that Imodium can be used with C. difficile if needed.  However, instead of using Imodium, given that patient does not have a gallbladder, and may be having a component of bile acid induced diarrhea, will try colestipol instead.  This may be safer to use than Imodium in the setting of C. difficile as well  However, if the diarrhea worsens or does not get better, I have advised the patient to call us even over the weekend and he verbalized understanding  With  no alarm symptoms present, no indication for prolonging treatment at this time, repeat C. difficile testing given the patient is still completing his treatment  If colestipol does not lead to improvement in symptoms or symptoms do not improve after completion of treatment, we may need to reassess treatment plans.  Patient states will call us and let us know if symptoms have not improved or if they worsen  Continue oral vancomycin as prescribed until completion of treatment  Clinic staff will call him to ensure that he has received the  prescription.  Patient advised to call us and let us know if there are any problems obtaining the prescription at the pharmacy.  Follow Up Instructions: Follow-up closely in clinic in 1 to 2 weeks or earlier if needed   I discussed the assessment and treatment plan with the patient. The patient was provided an opportunity to ask questions and all were answered. The patient agreed with the plan and demonstrated an understanding of the instructions.   The patient was advised to call back or seek an in-person evaluation if the symptoms worsen or if the condition fails to improve as anticipated.  I provided 10 minutes of non-face-to-face time during this encounter. Additional time was spent in reviewing patient's chart, placing orders etc.   Virgel Manifold, MD  Speech recognition software was used to dictate this note.

## 2020-09-25 NOTE — Telephone Encounter (Signed)
Pt had virtual visit today with Dr Bonna Gains

## 2020-10-05 ENCOUNTER — Encounter: Payer: Self-pay | Admitting: Family Medicine

## 2020-10-05 ENCOUNTER — Other Ambulatory Visit: Payer: Self-pay

## 2020-10-05 ENCOUNTER — Ambulatory Visit (INDEPENDENT_AMBULATORY_CARE_PROVIDER_SITE_OTHER): Payer: Medicare Other | Admitting: Family Medicine

## 2020-10-05 VITALS — BP 130/70 | HR 65 | Temp 97.9°F | Ht 72.0 in | Wt 216.4 lb

## 2020-10-05 DIAGNOSIS — I10 Essential (primary) hypertension: Secondary | ICD-10-CM

## 2020-10-05 DIAGNOSIS — E11319 Type 2 diabetes mellitus with unspecified diabetic retinopathy without macular edema: Secondary | ICD-10-CM

## 2020-10-05 DIAGNOSIS — M654 Radial styloid tenosynovitis [de Quervain]: Secondary | ICD-10-CM | POA: Diagnosis not present

## 2020-10-05 DIAGNOSIS — R197 Diarrhea, unspecified: Secondary | ICD-10-CM

## 2020-10-05 DIAGNOSIS — E118 Type 2 diabetes mellitus with unspecified complications: Secondary | ICD-10-CM | POA: Diagnosis not present

## 2020-10-05 DIAGNOSIS — A0472 Enterocolitis due to Clostridium difficile, not specified as recurrent: Secondary | ICD-10-CM | POA: Insufficient documentation

## 2020-10-05 NOTE — Progress Notes (Signed)
[name] Rumps, MD Phone: [phone]  [name] [name] is a 87 y.o. male who presents today for follow-up.  Hypertension: Occasionally checks at home.  Taking carvedilol, Cardura, hydralazine, and valsartan.  No chest pain or shortness of breath.  He did report some [place].  Notes he was sitting more.  He took torsemide when he returned home and now his legs are back to their baseline.  Diabetes: Typically less than 140.  Has been on Toujeo 41 units once daily.  No polyuria or polydipsia.  No hypoglycemia.  Left wrist pain: Patient notes this has been going on for a while.  Notes it feels like somebody is stabbing him in the radial aspect of his wrist.  Hurts with certain movements.  Notes it is tender and sore over the radial aspect and the dorsal aspect of his wrist.  Diarrhea: Patient was diagnosed with C. difficile diarrhea.  He was on oral vancomycin and ended up following up with GI virtually and they wondered if he had some degree of diarrhea related to not having his gallbladder.  They placed him on colestipol and the patient ended up not completing his course of vancomycin as he was unsure if he needed to.  He continues to have intermittent diarrhea.  Some days are better than others.  Notes it bothers him more in the morning.  No blood in stool.  The colestipol has not been helpful.  Social History   Tobacco Use  Smoking Status Never  Smokeless Tobacco Never    Current Outpatient Medications on File Prior to Visit  Medication Sig Dispense Refill   aspirin EC 81 MG tablet Take 81 mg by mouth every other day.     carvedilol (COREG) 12.5 MG tablet TAKE ONE (1) TABLET BY MOUTH TWO TIMES PER DAY 180 tablet 0   colestipol (COLESTID) 1 g tablet Take 2 tablets (2 g total) by mouth 2 (two) times daily. 120 tablet 1   doxazosin (CARDURA) 2 MG tablet Take 1 tablet (2 mg total) by mouth 2 (two) times daily. 60 tablet 6   ezetimibe (ZETIA) 10 MG tablet  Take 10 mg by mouth daily.     finasteride (PROSCAR) 5 MG tablet Take 1 tablet (5 mg total) by mouth daily. 90 tablet 1   gabapentin (NEURONTIN) 100 MG capsule TAKE 1 CAPSULE BY MOUTH 3 TIMES DAILY 90 capsule 1   hydrALAZINE (APRESOLINE) 25 MG tablet Take 1 tablet (25 mg total) by mouth 3 (three) times daily. 90 tablet 6   HYDROcodone-acetaminophen (NORCO) 5-325 MG tablet Take 1 tablet by mouth 3 (three) times daily as needed for moderate pain. 90 tablet 0   omeprazole (PRILOSEC) 20 MG capsule TAKE 1 CAPSULE BY MOUTH TWICE DAILY 60 capsule 1   tiZANidine (ZANAFLEX) 4 MG tablet Take 1 tablet (4 mg total) by mouth at bedtime as needed for muscle spasms. 30 tablet 0   TOUJEO SOLOSTAR 300 UNIT/ML Solostar Pen INJECT 50 UNITS DAILY MAY INCREASE 2U EVERY 3 DAYS UNTIL FBG IS AT GOAL UP TO 80 UNITS DAILY 22.5 mL 6   valsartan (DIOVAN) 320 MG tablet TAKE ONE TABLET BY MOUTH EVERY DAY 30 tablet 5   vancomycin (VANCOCIN) 125 MG capsule Take 1 capsule (125 mg total) by mouth 4 (four) times daily. 40 capsule 0   Vitamin D, Cholecalciferol, 1000 units TABS Take 1,000 Units by mouth daily.      zolpidem (AMBIEN) 5 MG tablet Take 1  tablet (5 mg total) by mouth at bedtime as needed for sleep. Stop trazadone not helpful 30 tablet 0   No current facility-administered medications on file prior to visit.     ROS see history of present illness  Objective  Physical Exam Vitals:   10/05/20 1359  BP: 130/70  Pulse: 65  Temp: 97.9 F (36.6 C)  SpO2: 97%    BP Readings from Last 3 Encounters:  10/05/20 130/70  09/02/20 (!) 132/58  08/12/20 110/60   Wt Readings from Last 3 Encounters:  10/05/20 216 lb 6.4 oz (98.2 kg)  09/02/20 216 lb 8 oz (98.2 kg)  08/12/20 213 lb 9.6 oz (96.9 kg)    Physical Exam Constitutional:      General: He is not in acute distress.    Appearance: He is not diaphoretic.  Cardiovascular:     Rate and Rhythm: Normal rate and regular rhythm.     Heart sounds: Normal heart  sounds.  Pulmonary:     Effort: Pulmonary effort is normal.     Breath sounds: Normal breath sounds.  Abdominal:     General: Bowel sounds are normal. There is no distension.     Palpations: Abdomen is soft.     Tenderness: There is abdominal tenderness (Mild discomfort on palpation throughout).  Musculoskeletal:     Comments: Tenderness over the radial aspect of his left wrist and slightly over the dorsal radial aspect of the left wrist, no anatomic snuffbox tenderness, there is positive Finkelstein's in the left wrist  Skin:    General: Skin is warm and dry.  Neurological:     Mental Status: He is alert.     Assessment/Plan: Please see individual problem list.  Problem List Items Addressed This Visit     C. difficile diarrhea    The patient continues to have diarrhea.  He did not complete his course of antibiotics.  We will check his stool again and if he remains positive would treat again.  If negative he will need to follow-up with GI for further management of his diarrhea.       Relevant Orders   C Difficile Quick Screen w PCR reflex   De Quervain's tenosynovitis, left    I suspect this is the cause of his wrist discomfort.  Discussed icing the radial aspect of his wrist with a cube of ice for 10 minutes a couple of times a day.  If not improving he will let us know and we could refer to orthopedics.       Diabetic retinopathy ([place])    His eye exam is up-to-date.       Essential hypertension    Seems to be well controlled.  He will continue carvedilol 12.5 mg twice daily, Cardura 2 mg twice daily, hydralazine 25 mg 3 times daily, and valsartan 320 mg daily.       Type 2 diabetes mellitus with complication, without long-term current use of insulin (HCC)    He will continue Toujeo 21 units once daily.  He will continue to monitor his glucose.  This seems to be well controlled.       Other Visit Diagnoses     Diarrhea of presumed infectious origin    -  Primary    Relevant Orders   C Difficile Quick Screen w PCR reflex      Return in about 3 months (around 01/05/2021).  This visit occurred during the SARS-CoV-2 public health emergency.  Safety protocols were in place,  including screening questions prior to the visit, additional usage of staff PPE, and extensive cleaning of exam room while observing appropriate contact time as indicated for disinfecting solutions.    [name] Rumps, MD [place]

## 2020-10-05 NOTE — Assessment & Plan Note (Signed)
He will continue Toujeo 21 units once daily.  He will continue to monitor his glucose.  This seems to be well controlled.

## 2020-10-05 NOTE — Assessment & Plan Note (Signed)
I suspect this is the cause of his wrist discomfort.  Discussed icing the radial aspect of his wrist with a cube of ice for 10 minutes a couple of times a day.  If not improving he will let us know and we could refer to orthopedics.

## 2020-10-05 NOTE — Assessment & Plan Note (Signed)
The patient continues to have diarrhea.  He did not complete his course of antibiotics.  We will check his stool again and if he remains positive would treat again.  If negative he will need to follow-up with GI for further management of his diarrhea.

## 2020-10-05 NOTE — Assessment & Plan Note (Signed)
Seems to be well controlled.  He will continue carvedilol 12.5 mg twice daily, Cardura 2 mg twice daily, hydralazine 25 mg 3 times daily, and valsartan 320 mg daily.

## 2020-10-05 NOTE — Assessment & Plan Note (Signed)
His eye exam is up-to-date.

## 2020-10-05 NOTE — Patient Instructions (Signed)
Nice to see you. Please take a stool sample to the lab at the hospital. Please try icing your wrist as we discussed.  If its not improving over the next week please let me know. If you develop blood in your stool or abdominal pain please seek medical attention.

## 2020-10-06 ENCOUNTER — Other Ambulatory Visit: Payer: Self-pay | Admitting: Cardiovascular Disease

## 2020-10-06 ENCOUNTER — Other Ambulatory Visit: Payer: Self-pay | Admitting: Family Medicine

## 2020-10-06 ENCOUNTER — Other Ambulatory Visit
Admission: RE | Admit: 2020-10-06 | Discharge: 2020-10-06 | Disposition: A | Discharge status: Home / Self Care | Payer: Medicare Other | Source: Ambulatory Visit | Attending: Family Medicine | Admitting: Family Medicine

## 2020-10-06 DIAGNOSIS — R197 Diarrhea, unspecified: Secondary | ICD-10-CM | POA: Diagnosis not present

## 2020-10-06 DIAGNOSIS — A0472 Enterocolitis due to Clostridium difficile, not specified as recurrent: Secondary | ICD-10-CM

## 2020-10-06 LAB — C DIFFICILE QUICK SCREEN W PCR REFLEX
C Diff antigen: POSITIVE — AB
C Diff toxin: NEGATIVE

## 2020-10-06 LAB — CLOSTRIDIUM DIFFICILE BY PCR, REFLEXED: Toxigenic C. Difficile by PCR: POSITIVE — AB

## 2020-10-06 MED ORDER — VANCOMYCIN HCL 125 MG PO CAPS: 125.0000 mg | ORAL_CAPSULE | Freq: Four times a day (QID) | ORAL | 0 refills | Status: DC

## 2020-10-12 ENCOUNTER — Telehealth: Payer: Self-pay

## 2020-10-12 NOTE — Telephone Encounter (Signed)
Pt lmovm to report he is not doing much better since starting Ax... please advise

## 2020-10-14 ENCOUNTER — Encounter: Payer: Self-pay | Admitting: Gastroenterology

## 2020-10-14 ENCOUNTER — Other Ambulatory Visit: Payer: Self-pay

## 2020-10-14 ENCOUNTER — Ambulatory Visit: Payer: Medicare Other | Admitting: Gastroenterology

## 2020-10-14 VITALS — BP 184/67 | HR 64 | Temp 97.7°F | Ht 72.0 in | Wt 217.2 lb

## 2020-10-14 DIAGNOSIS — H353211 Exudative age-related macular degeneration, right eye, with active choroidal neovascularization: Secondary | ICD-10-CM | POA: Diagnosis not present

## 2020-10-14 DIAGNOSIS — A09 Infectious gastroenteritis and colitis, unspecified: Secondary | ICD-10-CM

## 2020-10-14 MED ORDER — LOPERAMIDE HCL 2 MG PO TABS
2.0000 mg | ORAL_TABLET | Freq: Two times a day (BID) | ORAL | 0 refills | Status: DC | PRN
Start: 2020-10-14 — End: 2020-11-13

## 2020-10-14 NOTE — Patient Instructions (Signed)
Please Discontinue the Colestipol

## 2020-10-15 NOTE — Progress Notes (Signed)
[name], MD [address]  [place]  [place], [place] [address]  Main: [phone]  Fax: [phone]   Primary Care Physician: [name], MD   Chief Complaint  Patient presents with   Diarrhea    2-3 times a day. Not having blood in stool. States he is still taking Vancomycin for C dif     HPI: [name] [name] is a 87 y.o. male here for follow-up of diarrhea.  Patient was diagnosed with C. difficile and prescribed oral vancomycin course.  However, his diarrhea persisted and he contacted his primary care provider who repeated C. difficile testing and this was positive and prescribed him a second course of oral vancomycin for 10 days as well.  He has only about 4 more days left.  At its worst, he was having 4 loose bowel movements a day, and now he is having to loose bowel movements, that are usually in the morning.  No blood in stool.  No abdominal pain.  No nausea or vomiting.  Good appetite. no fever or chills  Dr. Ellen Henri note from August 1 reviewed and they had restarted him on oral vancomycin on that day after positive results  ROS: All ROS reviewed and negative except as per HPI   Past Medical History:  Diagnosis Date   (HFpEF) heart failure with preserved ejection fraction ([place])    a. 0000000 Echo: nl systolic fxn, mild LVH, diastolic relaxation abnormality, mildly enlarged LA, mild Ao insufficiency; 01/2018 Echo: EF 55-60%, no rwma, GR2 DD, triv AI, mildly dil LA, mild inc PASP.   BPH (benign prostatic hyperplasia)    Cancer (HCC)    skin   Cataract    Choledocholithiasis    a. 05/2020 ERCP w/ CBD stone removal and biliary sphincterotomy.   Coronary artery disease    a. 01/2005 s/p CABG x 4: LIMA-LAD, VG-Diag, VG-OM3, VG-dRCA; b. 06/2013 Cath: patent grafts; c. 09/2015 Cath: VG->Diag 100, other grafts patent. LAD 100, RCA sev dzs->Med rx; d. 01/2018 Cath: LAD 100ost/p, LCX 50, RCA 100p/d, VG->OM3 100, VG->Diag 100, LIMA->LAD ok, VG->RPAV  21m->Med rx.   COVID-19    07/2020 had paxlovid    Dyspnea    with exertion   Dysrhythmia    ED (erectile dysfunction)    Elevated LFTs    a. 05/2020 following CBD stone. 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   GERD (gastroesophageal reflux disease)    Hyperlipidemia    Hypertension    IDDM (insulin dependent diabetes mellitus) 2000   Osteoarthritis    PAH (pulmonary artery hypertension) ([place]    a. 01/2018 RHC: PA 51/22 (32).   Perirectal fistula    Pneumonia 12/2016   Shingles 09/04/2018   Tubular adenoma of colon 07/2012   Vertigo     Past Surgical History:  Procedure Laterality Date   BACK SURGERY     CARDIAC CATHETERIZATION  06/05/2013   [facility]    CARDIAC CATHETERIZATION N/A 09/24/2015   Procedure: Right Heart Cath and Coronary/Graft Angiography;  Surgeon: TMinna Merritts MD;  [facility];  Service: Cardiovascular;  Laterality: N/A;   CATARACT EXTRACTION  sept 2013   right   CATARACT EXTRACTION W/PHACO Left 03/28/2017   Procedure: CATARACT EXTRACTION PHACO AND INTRAOCULAR LENS PLACEMENT (IOC);  Surgeon: PBirder Robson MD;  Location: [facility] ORS;  Service: Ophthalmology;  Laterality: Left;  [place]:42.0 AP% 15.0 CDE 6.31 Fluid Pack lot # [id number]  CHOLECYSTECTOMY  05/15/2011   Procedure: LAPAROSCOPIC CHOLECYSTECTOMY;  Surgeon: Rolm Bookbinder, MD;  Location: [place];  Service: General;  Laterality: N/A;   COLONOSCOPY W/ POLYPECTOMY     CORONARY ARTERY BYPASS GRAFT  2007   x 4   ERCP N/A 05/05/2020   Procedure: ENDOSCOPIC RETROGRADE CHOLANGIOPANCREATOGRAPHY (ERCP);  Surgeon: Lucilla Lame, MD;  Location: [facility] ENDOSCOPY;  Service: Endoscopy;  Laterality: N/A;   ERCP N/A 05/12/2020   Procedure: ENDOSCOPIC RETROGRADE CHOLANGIOPANCREATOGRAPHY (ERCP);  Surgeon: Lucilla Lame, MD;  Location: [facility] ENDOSCOPY;  Service: Endoscopy;  Laterality: N/A;   ESOPHAGOGASTRODUODENOSCOPY N/A 05/12/2020   Procedure:  ESOPHAGOGASTRODUODENOSCOPY (EGD);  Surgeon: Lucilla Lame, MD;  Location: [facility] ENDOSCOPY;  Service: Endoscopy;  Laterality: N/A;   EYE SURGERY     FOOT SURGERY  2012   right foot   JOINT REPLACEMENT  8/10   Right THR--[name]   LEFT AND RIGHT HEART CATHETERIZATION WITH CORONARY/GRAFT ANGIOGRAM N/A 06/05/2013   Procedure: LEFT AND RIGHT HEART CATHETERIZATION WITH Beatrix Fetters;  Surgeon: Peter M Martinique, MD;  Location: [facility] CATH LAB;  Service: Cardiovascular;  Laterality: N/A;   LUMBAR LAMINECTOMY  1989   Prostate photovaporization  5/16   Dr Budd Palmer   RIGHT/LEFT HEART CATH AND CORONARY/GRAFT ANGIOGRAPHY N/A 01/05/2018   Procedure: RIGHT/LEFT HEART CATH AND CORONARY/GRAFT ANGIOGRAPHY;  Surgeon: Minna Merritts, MD;  Location: [place] CV LAB;  Service: Cardiovascular;  Laterality: N/A;    Prior to Admission medications   Medication Sig Start Date End Date Taking? Authorizing Provider  aspirin EC 81 MG tablet Take 81 mg by mouth every other day.   Yes [provider]  carvedilol (COREG) 12.5 MG tablet TAKE ONE (1) TABLET BY MOUTH TWO TIMES PER DAY 10/07/20  Yes Gollan, Kathlene November, MD  doxazosin (CARDURA) 2 MG tablet Take 1 tablet (2 mg total) by mouth 2 (two) times daily. 09/02/20  Yes Minna Merritts, MD  finasteride (PROSCAR) 5 MG tablet Take 1 tablet (5 mg total) by mouth daily. 03/11/20  Yes [name], MD  gabapentin (NEURONTIN) 100 MG capsule TAKE 1 CAPSULE BY MOUTH 3 TIMES DAILY 08/05/20  Yes [name], MD  HYDROcodone-acetaminophen (NORCO) 5-325 MG tablet Take 1 tablet by mouth 3 (three) times daily as needed for moderate pain. 08/12/20  Yes McLean-Scocuzza, Nino Glow, MD  loperamide (IMODIUM A-D) 2 MG tablet Take 1 tablet (2 mg total) by mouth 2 (two) times daily as needed for diarrhea or loose stools. 10/14/20  Yes [name] B, MD  omeprazole (PRILOSEC) 20 MG capsule TAKE 1 CAPSULE BY MOUTH TWICE DAILY 10/07/20  Yes [name], MD   TOUJEO SOLOSTAR 300 UNIT/ML Solostar Pen INJECT 50 UNITS DAILY MAY INCREASE 2U EVERY 3 DAYS UNTIL FBG IS AT GOAL UP TO 80 UNITS DAILY 06/22/20  Yes [name], MD  valsartan (DIOVAN) 320 MG tablet TAKE ONE TABLET BY MOUTH EVERY DAY 09/25/20  Yes Theora Gianotti, NP  vancomycin (VANCOCIN) 125 MG capsule Take 1 capsule (125 mg total) by mouth 4 (four) times daily. 10/06/20  Yes [name], MD  Vitamin D, Cholecalciferol, 1000 units TABS Take 1,000 Units by mouth daily.    Yes [provider]  zolpidem (AMBIEN) 5 MG tablet Take 1 tablet (5 mg total) by mouth at bedtime as needed for sleep. Stop trazadone not helpful 02/26/19  Yes [name], MD    Family History  Problem Relation Age of Onset   Lung cancer Mother  Heart disease Father    Colon cancer Neg Hx    Breast cancer Neg Hx      Social History   Tobacco Use   Smoking status: Never   Smokeless tobacco: Never  Vaping Use   Vaping Use: Never used  Substance Use Topics   Alcohol use: Yes    Comment: occ cocktail   Drug use: No    Allergies as of 10/14/2020 - Review Complete 10/14/2020  Allergen Reaction Noted   Cortisone Other (See Comments) 06/24/2016   Metformin Diarrhea 09/05/2016   Metformin and related Diarrhea 09/05/2016    Physical Examination:  Constitutional: General:   Alert,  Well-developed, well-nourished, pleasant and cooperative in NAD BP (!) 184/67 (BP Location: Left Arm, Patient Position: Sitting, Cuff Size: Normal)   Pulse 64   Temp 97.7 F (36.5 C) (Oral)   Ht 6' (1.829 m)   Wt 217 lb 4 oz (98.5 kg)   BMI 29.46 kg/m   Respiratory: Normal respiratory effort  Gastrointestinal:  Soft, non-tender and non-distended without masses, hepatosplenomegaly or hernias noted.  No guarding or rebound tenderness.     Cardiac: No clubbing or edema.  No cyanosis. Normal posterior tibial pedal pulses noted.  Psych:  Alert and cooperative. Normal mood and  affect.  Musculoskeletal:  Normal gait. Head normocephalic, atraumatic. Symmetrical without gross deformities. 5/5 Lower extremity strength bilaterally.  Skin: Warm. Intact without significant lesions or rashes. No jaundice.  Neck: Supple, trachea midline  Lymph: No cervical lymphadenopathy  Psych:  Alert and oriented x3, Alert and cooperative. Normal mood and affect.  Labs: CMP     Component Value Date/Time   NA 140 07/22/2020 1402   NA 139 02/22/2013 1654   K 4.3 07/22/2020 1402   K 4.5 02/22/2013 1654   CL 107 (H) 07/22/2020 1402   CL 109 (H) 02/22/2013 1654   CO2 20 07/22/2020 1402   CO2 24 02/22/2013 1654   GLUCOSE 243 (H) 07/22/2020 1402   GLUCOSE 156 (H) 06/30/2020 1231   GLUCOSE 197 (H) 02/22/2013 1654   BUN 16 07/22/2020 1402   BUN 20 (H) 02/22/2013 1654   CREATININE 1.31 (H) 07/22/2020 1402   CREATININE 1.18 02/22/2013 1654   CREATININE 1.13 05/25/2012 1630   CALCIUM 8.5 (L) 07/22/2020 1402   CALCIUM 9.1 02/22/2013 1654   PROT 5.8 (L) 08/25/2020 1144   PROT 6.2 (L) 02/22/2013 1654   ALBUMIN 3.7 08/25/2020 1144   ALBUMIN 3.5 02/22/2013 1654   AST 18 08/25/2020 1144   AST 20 02/22/2013 1654   ALT 15 08/25/2020 1144   ALT 42 02/22/2013 1654   ALKPHOS 180 (H) 08/25/2020 1144   ALKPHOS 113 02/22/2013 1654   BILITOT 0.8 08/25/2020 1144   BILITOT 0.6 02/22/2013 1654   GFRNONAA 47 (L) 06/30/2020 1231   GFRNONAA 59 (L) 02/22/2013 1654   GFRAA 53 (L) 04/27/2020 0930   GFRAA >60 02/22/2013 1654   Lab Results  Component Value Date   WBC 5.2 05/15/2020   HGB 11.5 (L) 05/15/2020   HCT 34.1 (L) 05/15/2020   MCV 94.7 05/15/2020   PLT 104 (L) 05/15/2020   C. difficile antigen and PCR + October 06, 2020  Imaging Studies:   Assessment and Plan:   [name] [name] is a 87 y.o. y/o male here for follow-up of C. difficile diarrhea  I believe the patient's symptoms of C. difficile itself are overall improving given that he was having 4 loose bowel movements a day,  and is now  only having 2 bowel movements that are usually in the mornings but are still loose.  Would recommend completing current oral vancomycin course as he still has 4 more days left  Stop colestipol as it did not help with his loose stools  Start Imodium 2-3 times a day.  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 difficile diarrhea as well.  Literature suggests that Imodium can be used in the setting of C. difficile and given that C. difficile itself is improving, but he needs some help with the loose bowel movements he is having, which have not improved with colestipol, okay to use Imodium sparingly  If symptoms are not better with this, patient advised to give [place] a call and he verbalized understanding  Follow-up in 2 weeks to reassess symptoms as well  Repeat testing at this time for C. difficile will not be helpful    Dr [name]

## 2020-10-20 ENCOUNTER — Ambulatory Visit: Payer: Medicare Other | Admitting: Gastroenterology

## 2020-10-23 ENCOUNTER — Other Ambulatory Visit: Payer: Self-pay | Admitting: Nurse Practitioner

## 2020-10-23 MED ORDER — VALSARTAN 320 MG PO TABS: 320.0000 mg | ORAL_TABLET | Freq: Every day | ORAL | 3 refills | Status: DC

## 2020-10-26 ENCOUNTER — Ambulatory Visit: Payer: Medicare Other | Admitting: Cardiovascular Disease

## 2020-10-26 ENCOUNTER — Telehealth: Payer: Self-pay | Admitting: Cardiovascular Disease

## 2020-10-26 NOTE — Telephone Encounter (Signed)
Spoke to pt. Per Dr. Donivan Scull last ov note 09/02/20: Essential hypertension Blood pressure elevated, recommend he stop amlodipine given leg swelling Suggest he start Cardura 2 mg twice daily Recommend he call us with blood pressure measurements  Notified pt that no changes made to Valsartan so he may continue as prescribed.  Pt confirmed that his BP is WNL and no concersn re BP, he only wanted to clarify to continue.  Pt has no further questions at this time.

## 2020-10-26 NOTE — Telephone Encounter (Signed)
Please call to discuss if patient should still continue to take Valsartan

## 2020-10-27 ENCOUNTER — Other Ambulatory Visit: Payer: Self-pay | Admitting: Family Medicine

## 2020-10-28 ENCOUNTER — Telehealth: Payer: Self-pay

## 2020-10-28 NOTE — Telephone Encounter (Signed)
Pt asked to speak with you and states that he was returning your call.

## 2020-10-29 NOTE — Telephone Encounter (Signed)
I checked the note and I did not call the patient I think it was another office. Westly Hinnant,cma

## 2020-11-04 ENCOUNTER — Ambulatory Visit: Payer: Medicare Other | Admitting: Family Medicine

## 2020-11-10 DIAGNOSIS — H353211 Exudative age-related macular degeneration, right eye, with active choroidal neovascularization: Secondary | ICD-10-CM | POA: Diagnosis not present

## 2020-11-12 ENCOUNTER — Other Ambulatory Visit: Payer: Self-pay | Admitting: Gastroenterology

## 2020-11-12 ENCOUNTER — Encounter: Payer: Self-pay | Admitting: Gastroenterology

## 2020-11-12 ENCOUNTER — Ambulatory Visit: Payer: Medicare Other | Admitting: Gastroenterology

## 2020-11-12 ENCOUNTER — Other Ambulatory Visit: Payer: Self-pay

## 2020-11-12 VITALS — BP 147/61 | HR 62 | Temp 97.9°F | Ht 72.0 in | Wt 220.0 lb

## 2020-11-12 DIAGNOSIS — R748 Abnormal levels of other serum enzymes: Secondary | ICD-10-CM

## 2020-11-12 DIAGNOSIS — R195 Other fecal abnormalities: Secondary | ICD-10-CM

## 2020-11-13 ENCOUNTER — Other Ambulatory Visit: Payer: Self-pay | Admitting: Gastroenterology

## 2020-11-13 ENCOUNTER — Other Ambulatory Visit: Payer: Self-pay | Admitting: Family Medicine

## 2020-11-13 LAB — HEPATIC FUNCTION PANEL
ALT: 12 IU/L (ref 0–44)
AST: 18 IU/L (ref 0–40)
Albumin: 3.6 g/dL (ref 3.6–4.6)
Alkaline Phosphatase: 155 IU/L — ABNORMAL HIGH (ref 44–121)
Bilirubin Total: 0.5 mg/dL (ref 0.0–1.2)
Bilirubin, Direct: 0.19 mg/dL (ref 0.00–0.40)
Total Protein: 6.1 g/dL (ref 6.0–8.5)

## 2020-11-13 LAB — GAMMA GT: GGT: 62 IU/L (ref 0–65)

## 2020-11-13 MED ORDER — LOPERAMIDE HCL 2 MG PO TABS
2.0000 mg | ORAL_TABLET | Freq: Two times a day (BID) | ORAL | 0 refills | Status: DC | PRN
Start: 2020-11-13 — End: 2022-03-22

## 2020-11-13 NOTE — Progress Notes (Signed)
[name], MD [address]  [place]  [place], [place] [address]  Main: [phone]  Fax: [phone]   Primary Care Physician: [name], MD  Chief complaint: Loose stools   HPI: [name] [name] is a 87 y.o. male here for follow-up of loose stools.  Patient was diagnosed with C. difficile diarrhea, in July 2022.  He underwent 2 courses of treatment, as he called his primary care provider with ongoing symptoms after completing first course of vancomycin, and due to repeat testing being positive, he was retreated with oral vancomycin by PCP as well.  Patient is now reporting 1-2 loose bowel movements a day.  Type V-VI.  Reports at baseline had type III-IV bowel movements.  When seated for started, he was having 3-4 loose bowel movements a day, that were type VI-VII.  No blood in stool.  No abdominal pain.  No nausea or vomiting.  Has been taking Imodium once a day which has helped.  No fever or chills.   ROS: All ROS reviewed and negative except as per HPI   Past Medical History:  Diagnosis Date   (HFpEF) heart failure with preserved ejection fraction ([place])    a. 0/2637 Echo: nl systolic fxn, mild LVH, diastolic relaxation abnormality, mildly enlarged LA, mild Ao insufficiency; 01/2018 Echo: EF 55-60%, no rwma, GR2 DD, triv AI, mildly dil LA, mild inc PASP.   BPH (benign prostatic hyperplasia)    Cancer (HCC)    skin   Cataract    Choledocholithiasis    a. 05/2020 ERCP w/ CBD stone removal and biliary sphincterotomy.   Coronary artery disease    a. 01/2005 s/p CABG x 4: LIMA-LAD, VG-Diag, VG-OM3, VG-dRCA; b. 06/2013 Cath: patent grafts; c. 09/2015 Cath: VG->Diag 100, other grafts patent. LAD 100, RCA sev dzs->Med rx; d. 01/2018 Cath: LAD 100ost/p, LCX 50, RCA 100p/d, VG->OM3 100, VG->Diag 100, LIMA->LAD ok, VG->RPAV 53m->Med rx.   COVID-19    07/2020 had paxlovid    Dyspnea    with exertion   Dysrhythmia    ED (erectile dysfunction)    Elevated LFTs     a. 05/2020 following CBD stone. 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   GERD (gastroesophageal reflux disease)    Hyperlipidemia    Hypertension    IDDM (insulin dependent diabetes mellitus) 2000   Osteoarthritis    PAH (pulmonary artery hypertension) ([place]    a. 01/2018 RHC: PA 51/22 (32).   Perirectal fistula    Pneumonia 12/2016   Shingles 09/04/2018   Tubular adenoma of colon 07/2012   Vertigo     Past Surgical History:  Procedure Laterality Date   BACK SURGERY     CARDIAC CATHETERIZATION  06/05/2013   [facility].    CARDIAC CATHETERIZATION N/A 09/24/2015   Procedure: Right Heart Cath and Coronary/Graft Angiography;  Surgeon: TMinna Merritts MD;  [facility];  Service: Cardiovascular;  Laterality: N/A;   CATARACT EXTRACTION  sept 2013   right   CATARACT EXTRACTION W/PHACO Left 03/28/2017   Procedure: CATARACT EXTRACTION PHACO AND INTRAOCULAR LENS PLACEMENT (IOC);  Surgeon: PBirder Robson MD;  Location: [facility] ORS;  Service: Ophthalmology;  Laterality: Left;  [place]:42.0 AP% 15.0 CDE 6.31 Fluid Pack lot # [id number]   CHOLECYSTECTOMY  05/15/2011   Procedure: LAPAROSCOPIC CHOLECYSTECTOMY;  Surgeon: MRolm Bookbinder MD;  [place]  Service: General;  Laterality: N/A;   COLONOSCOPY W/ POLYPECTOMY  CORONARY ARTERY BYPASS GRAFT  2007   x 4   ERCP N/A 05/05/2020   Procedure: ENDOSCOPIC RETROGRADE CHOLANGIOPANCREATOGRAPHY (ERCP);  Surgeon: Lucilla Lame, MD;  [facility]COPY;  Service: Endoscopy;  Laterality: N/A;   ERCP N/A 05/12/2020   Procedure: ENDOSCOPIC RETROGRADE CHOLANGIOPANCREATOGRAPHY (ERCP);  Surgeon: Lucilla Lame, MD;  Location: [facility] ENDOSCOPY;  Service: Endoscopy;  Laterality: N/A;   ESOPHAGOGASTRODUODENOSCOPY N/A 05/12/2020   Procedure: ESOPHAGOGASTRODUODENOSCOPY (EGD);  Surgeon: Lucilla Lame, MD;  Location: [facility] ENDOSCOPY;  Service: Endoscopy;  Laterality: N/A;   EYE SURGERY     FOOT SURGERY   2012   right foot   JOINT REPLACEMENT  8/10   Right THR--[name]   LEFT AND RIGHT HEART CATHETERIZATION WITH CORONARY/GRAFT ANGIOGRAM N/A 06/05/2013   Procedure: LEFT AND RIGHT HEART CATHETERIZATION WITH Beatrix Fetters;  Surgeon: Peter M Martinique, MD;  Location: [facility] CATH LAB;  Service: Cardiovascular;  Laterality: N/A;   LUMBAR LAMINECTOMY  1989   Prostate photovaporization  5/16   Dr Budd Palmer   RIGHT/LEFT HEART CATH AND CORONARY/GRAFT ANGIOGRAPHY N/A 01/05/2018   Procedure: RIGHT/LEFT HEART CATH AND CORONARY/GRAFT ANGIOGRAPHY;  Surgeon: Minna Merritts, MD;  Location: [place] CV LAB;  Service: Cardiovascular;  Laterality: N/A;    Prior to Admission medications   Medication Sig Start Date End Date Taking? Authorizing Provider  aspirin EC 81 MG tablet Take 81 mg by mouth every other day.   Yes [provider]  BD PEN NEEDLE NANO U/F 32G X 4 MM MISC USE AS DIRECTED 10/27/20  Yes [name], MD  carvedilol (COREG) 12.5 MG tablet TAKE ONE (1) TABLET BY MOUTH TWO TIMES PER DAY 10/07/20  Yes Gollan, Kathlene November, MD  doxazosin (CARDURA) 2 MG tablet Take 1 tablet (2 mg total) by mouth 2 (two) times daily. 09/02/20  Yes Minna Merritts, MD  finasteride (PROSCAR) 5 MG tablet Take 1 tablet (5 mg total) by mouth daily. 03/11/20  Yes [name], MD  gabapentin (NEURONTIN) 100 MG capsule TAKE 1 CAPSULE BY MOUTH 3 TIMES DAILY 08/05/20  Yes [name], MD  HYDROcodone-acetaminophen (NORCO) 5-325 MG tablet Take 1 tablet by mouth 3 (three) times daily as needed for moderate pain. 08/12/20  Yes McLean-Scocuzza, Nino Glow, MD  loperamide (IMODIUM A-D) 2 MG tablet Take 1 tablet (2 mg total) by mouth 2 (two) times daily as needed for diarrhea or loose stools. 10/14/20  Yes [name] B, MD  omeprazole (PRILOSEC) 20 MG capsule TAKE 1 CAPSULE BY MOUTH TWICE DAILY 10/07/20  Yes [name], MD  TOUJEO SOLOSTAR 300 UNIT/ML Solostar Pen INJECT 50 UNITS DAILY MAY  INCREASE 2U EVERY 3 DAYS UNTIL FBG IS AT GOAL UP TO 80 UNITS DAILY 06/22/20  Yes [name], MD  valsartan (DIOVAN) 320 MG tablet Take 1 tablet (320 mg total) by mouth daily. 10/23/20  Yes Theora Gianotti, NP  Vitamin D, Cholecalciferol, 1000 units TABS Take 1,000 Units by mouth daily.    Yes [provider]  zolpidem (AMBIEN) 5 MG tablet Take 1 tablet (5 mg total) by mouth at bedtime as needed for sleep. Stop trazadone not helpful 02/26/19  Yes [name], MD    Family History  Problem Relation Age of Onset   Lung cancer Mother    Heart disease Father    Colon cancer Neg Hx    Breast cancer Neg Hx      Social History   Tobacco Use   Smoking status: Never  Smokeless tobacco: Never  Vaping Use   Vaping Use: Never used  Substance Use Topics   Alcohol use: Yes    Comment: occ cocktail   Drug use: No    Allergies as of 11/12/2020 - Review Complete 11/12/2020  Allergen Reaction Noted   Cortisone Other (See Comments) 06/24/2016   Metformin Diarrhea 09/05/2016   Metformin and related Diarrhea 09/05/2016    Physical Examination:  Constitutional: General:   Alert,  Well-developed, well-nourished, pleasant and cooperative in NAD BP (!) 147/61   Pulse 62   Temp 97.9 F (36.6 C) (Oral)   Wt 220 lb (99.8 kg)   BMI 29.84 kg/m   Respiratory: Normal respiratory effort  Gastrointestinal:  Soft, non-tender and non-distended without masses, hepatosplenomegaly or hernias noted.  No guarding or rebound tenderness.     Cardiac: No clubbing or edema.  No cyanosis. Normal posterior tibial pedal pulses noted.  Psych:  Alert and cooperative. Normal mood and affect.  Musculoskeletal:  Normal gait. Head normocephalic, atraumatic. Symmetrical without gross deformities. 5/5 Lower extremity strength bilaterally.  Skin: Warm. Intact without significant lesions or rashes. No jaundice.  Neck: Supple, trachea midline  Lymph: No cervical  lymphadenopathy  Psych:  Alert and oriented x3, Alert and cooperative. Normal mood and affect.  Labs: CMP     Component Value Date/Time   NA 140 07/22/2020 1402   NA 139 02/22/2013 1654   K 4.3 07/22/2020 1402   K 4.5 02/22/2013 1654   CL 107 (H) 07/22/2020 1402   CL 109 (H) 02/22/2013 1654   CO2 20 07/22/2020 1402   CO2 24 02/22/2013 1654   GLUCOSE 243 (H) 07/22/2020 1402   GLUCOSE 156 (H) 06/30/2020 1231   GLUCOSE 197 (H) 02/22/2013 1654   BUN 16 07/22/2020 1402   BUN 20 (H) 02/22/2013 1654   CREATININE 1.31 (H) 07/22/2020 1402   CREATININE 1.18 02/22/2013 1654   CREATININE 1.13 05/25/2012 1630   CALCIUM 8.5 (L) 07/22/2020 1402   CALCIUM 9.1 02/22/2013 1654   PROT 6.1 11/12/2020 1520   PROT 6.2 (L) 02/22/2013 1654   ALBUMIN 3.6 11/12/2020 1520   ALBUMIN 3.5 02/22/2013 1654   AST 18 11/12/2020 1520   AST 20 02/22/2013 1654   ALT 12 11/12/2020 1520   ALT 42 02/22/2013 1654   ALKPHOS 155 (H) 11/12/2020 1520   ALKPHOS 113 02/22/2013 1654   BILITOT 0.5 11/12/2020 1520   BILITOT 0.6 02/22/2013 1654   GFRNONAA 47 (L) 06/30/2020 1231   GFRNONAA 59 (L) 02/22/2013 1654   GFRAA 53 (L) 04/27/2020 0930   GFRAA >60 02/22/2013 1654   Lab Results  Component Value Date   WBC 5.2 05/15/2020   HGB 11.5 (L) 05/15/2020   HCT 34.1 (L) 05/15/2020   MCV 94.7 05/15/2020   PLT 104 (L) 05/15/2020    Imaging Studies:   Assessment and Plan:   GHAZI RUMPF is a 87 y.o. y/o male here for follow-up of loose stools, and C. difficile in July 2022  His current loose stools are unlikely to be from C. difficile, as C. difficile diarrhea is typically expected to cause 3 or more loose bowel movements and patient is only reporting 1-2 loose bowel movements a day.  Would not recommend repeat C. difficile testing at this time, as C. difficile testing can remain positive for weeks after resolution of infection.  Rather, it is important to rely on clinical symptoms at this time, and given that  the consistency of his bowel movements have  improved and he is only having 1-2 loose bowel movements a day, with C. difficile expected to cause 3 or more loose bowel movements a day, would not recommend further treatment with C. difficile at this time  Continue Imodium as needed.  Patient is only using it once a day.  He was advised that he can use it 2-3 times a day, but hold for constipation  Trial of probiotics also discussed.  Patient advised to use probiotics over-the-counter.  Although the data on this is not strong, given that patient's symptoms are improving overall, trial of probiotics is reasonable at this time, and will be low risk.  If symptoms do not improve, we may need to consider colonoscopy to evaluate for microscopic colitis and this was discussed with the patient  Continue close follow-up in clinic  Liver enzymes were repeated, and showed normal transaminases and total bilirubin.  Alk phos is elevated, but GGT is normal.  Therefore, elevation in alk phos is not from liver etiology.  Patient should follow-up with Dr. [name] to evaluate for any other etiology of his elevated alk phos such as bone, or medications.  Dr [name]

## 2020-11-15 NOTE — Addendum Note (Signed)
Addended by: Lurlean Nanny on: 11/15/2020 10:26 AM   Modules accepted: Miquel Dunn

## 2020-12-01 ENCOUNTER — Encounter: Payer: Self-pay | Admitting: Gastroenterology

## 2020-12-02 ENCOUNTER — Telehealth: Payer: Self-pay | Admitting: Cardiovascular Disease

## 2020-12-02 NOTE — Progress Notes (Signed)
I connected with  [name] [name] on 12/04/20   Evaluation Performed:  Follow-up visit  Date:  12/04/2020   ID:  [name] [name], [name] [name] [birth year], MRN [id number]  Patient Location:  [address] [address]   Provider location:   [facility], [place] office  PCP:  [name], MD  Cardiologist:  Arvid Right [facility]   Chief Complaint  Patient presents with   Shortness of Breath    Patient c/o shortness of breath with little to no exertion. Medications reviewed by the patient verbally.     History of Present Illness:    [name] [name] is a 87 y.o. male  past medical history of coronary artery disease, bypass surgery in November 2006, Stress test March 2017, cath 09/2015, medical management Occluded proximal LAD  Critical/severe native RCA disease  Vein graft to the diagonal is occluded  Other vein grafts patent to the RCA/PDA, vein graft to the OM 3, LIMA graft  EF 55% in 12/2013 diabetes, numbers running high obesity,  hyperlipidemia,  hypertension,  erectile dysfunction Chronic SOB, nonsmoker Postherpetic neuralgia left flank Stable angina, chronic diastolic CHF, chronic shortness of breath Pulmonary hypertension on right heart catheterization who presents for routine followup of his coronary artery disease, and hypertension  In follow-up today he reports he is not doing well Reports worsening shortness of breath Symptoms similar to prior to CABG in 2006  Unable to walk in his house without having to stop sit down and recover Wife who presents with him today also concerned that his activities have dramatically changed, much more short of breath than usual  Denies leg swelling Compliant with his medications  Low but stable platelets over the past year around 100  Last catheterization 2019, at that time had 50% to mid left circumflex, 50% stenosis vein graft to the PDA LIMA was patent to the LAD  EKG personally  reviewed by myself on todays visit Normal sinus rhythm rate 68 bpm PACs nonspecific ST abnormality anterolateral leads inferior leads, right bundle branch block   past medical history reviewed 05/2020: in hospital , [facility] records reviewed  COVID April 2022 treated with paxlovid  Prior echocardiogram in heart catheterization November 2019 Moderate pulmonary hypertension, Severe three-vessel disease Occluded proximal RCA, occluded ostial LAD LIMA graft to the LAD patent, vein graft to the PDA patent The findings this catheterization shows occluded vein graft to the OM 3, now occluded proximal native RCA  In the hospital February 2022 right upper quadrant abdominal pain with associated nausea and vomiting as well as fever with a T-max of one 1.5 for the last 3 to 4 days with associated generalized fatigue and tiredness.   cholestasis with elevated alkaline phosphatase and direct hyperbilirubinemia GGT elevated more than 200 which is consistent with hepatic origin of elevated alkaline phosphatase Acute hepatitis panel negative   Past Medical History:  Diagnosis Date   (HFpEF) heart failure with preserved ejection fraction ([place])    a. 11/9831 Echo: nl systolic fxn, mild LVH, diastolic relaxation abnormality, mildly enlarged LA, mild Ao insufficiency; 01/2018 Echo: EF 55-60%, no rwma, GR2 DD, triv AI, mildly dil LA, mild inc PASP.   BPH (benign prostatic hyperplasia)    Cancer (HCC)    skin   Cataract    Choledocholithiasis    a. 05/2020 ERCP w/ CBD stone removal and biliary sphincterotomy.   Coronary artery disease    a. 01/2005 s/p CABG x  4: LIMA-LAD, VG-Diag, VG-OM3, VG-dRCA; b. 06/2013 Cath: patent grafts; c. 09/2015 Cath: VG->Diag 100, other grafts patent. LAD 100, RCA sev dzs->Med rx; d. 01/2018 Cath: LAD 100ost/p, LCX 50, RCA 100p/d, VG->OM3 100, VG->Diag 100, LIMA->LAD ok, VG->RPAV 36m-->Med rx.   COVID-19    07/2020 had paxlovid    Dyspnea    with exertion   Dysrhythmia     ED (erectile dysfunction)    Elevated LFTs    a. 05/2020 following CBD stone. 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   GERD (gastroesophageal reflux disease)    Hyperlipidemia    Hypertension    IDDM (insulin dependent diabetes mellitus) 2000   Osteoarthritis    PAH (pulmonary artery hypertension) ([place])    a. 01/2018 RHC: PA 51/22 (32).   Perirectal fistula    Pneumonia 12/2016   Shingles 09/04/2018   Tubular adenoma of colon 07/2012   Vertigo    Past Surgical History:  Procedure Laterality Date   BACK SURGERY     CARDIAC CATHETERIZATION  06/05/2013   [facility]    CARDIAC CATHETERIZATION N/A 09/24/2015   Procedure: Right Heart Cath and Coronary/Graft Angiography;  Surgeon: Minna Merritts, MD;  Location: [facility];  Service: Cardiovascular;  Laterality: N/A;   CATARACT EXTRACTION  sept 2013   right   CATARACT EXTRACTION W/PHACO Left 03/28/2017   Procedure: CATARACT EXTRACTION PHACO AND INTRAOCULAR LENS PLACEMENT (IOC);  Surgeon: Birder Robson, MD;  Location: [facility]S;  Service: Ophthalmology;  Laterality: Left;  [place] 00:42.0 AP% 15.0 CDE 6.31 Fluid Pack lot # [id number]   CHOLECYSTECTOMY  05/15/2011   Procedure: LAPAROSCOPIC CHOLECYSTECTOMY;  Surgeon: Rolm Bookbinder, MD;  Location: [place];  Service: General;  Laterality: N/A;   COLONOSCOPY W/ POLYPECTOMY     CORONARY ARTERY BYPASS GRAFT  2007   x 4   ERCP N/A 05/05/2020   Procedure: ENDOSCOPIC RETROGRADE CHOLANGIOPANCREATOGRAPHY (ERCP);  Surgeon: Lucilla Lame, MD;  Location: [facility] ENDOSCOPY;  Service: Endoscopy;  Laterality: N/A;   ERCP N/A 05/12/2020   Procedure: ENDOSCOPIC RETROGRADE CHOLANGIOPANCREATOGRAPHY (ERCP);  Surgeon: Lucilla Lame, MD;  Location[facility] ENDOSCOPY;  Service: Endoscopy;  Laterality: N/A;   ESOPHAGOGASTRODUODENOSCOPY N/A 05/12/2020   Procedure: ESOPHAGOGASTRODUODENOSCOPY (EGD);  Surgeon: Lucilla Lame, MD;  Location: [facility] ENDOSCOPY;  Service: Endoscopy;   Laterality: N/A;   EYE SURGERY     FOOT SURGERY  2012   right foot   JOINT REPLACEMENT  8/10   Right THR--[name]   LEFT AND RIGHT HEART CATHETERIZATION WITH CORONARY/GRAFT ANGIOGRAM N/A 06/05/2013   Procedure: LEFT AND RIGHT HEART CATHETERIZATION WITH Beatrix Fetters;  Surgeon: Peter M Martinique, MD;  Location: [facility] CATH LAB;  Service: Cardiovascular;  Laterality: N/A;   LUMBAR LAMINECTOMY  1989   Prostate photovaporization  5/16   Dr Budd Palmer   RIGHT/LEFT HEART CATH AND CORONARY/GRAFT ANGIOGRAPHY N/A 01/05/2018   Procedure: RIGHT/LEFT HEART CATH AND CORONARY/GRAFT ANGIOGRAPHY;  Surgeon: Minna Merritts, MD;  Location: [place] [facility];  Service: Cardiovascular;  Laterality: N/A;     Current Outpatient Medications on File Prior to Visit  Medication Sig Dispense Refill   aspirin EC 81 MG tablet Take 81 mg by mouth every other day.     BD PEN NEEDLE NANO U/F 32G X 4 MM MISC USE AS DIRECTED 100 each 0   carvedilol (COREG) 12.5 MG tablet TAKE ONE (1) TABLET BY MOUTH TWO TIMES PER DAY 180 tablet 0   doxazosin (CARDURA) 2 MG tablet Take  1 tablet (2 mg total) by mouth 2 (two) times daily. 60 tablet 6   finasteride (PROSCAR) 5 MG tablet Take 1 tablet (5 mg total) by mouth daily. 90 tablet 1   gabapentin (NEURONTIN) 100 MG capsule TAKE 1 CAPSULE BY MOUTH 3 TIMES DAILY (Patient taking differently: Take 100 mg by mouth 2 (two) times daily.) 90 capsule 1   hydrALAZINE (APRESOLINE) 25 MG tablet Take 25 mg by mouth in the morning and at bedtime.     HYDROcodone-acetaminophen (NORCO) 5-325 MG tablet Take 1 tablet by mouth 3 (three) times daily as needed for moderate pain. 90 tablet 0   loperamide (IMODIUM A-D) 2 MG tablet Take 1 tablet (2 mg total) by mouth 2 (two) times daily as needed for up to 60 doses for diarrhea or loose stools. 60 tablet 0   omeprazole (PRILOSEC) 20 MG capsule TAKE 1 CAPSULE BY MOUTH TWICE DAILY 60 capsule 1   TOUJEO SOLOSTAR 300 UNIT/ML Solostar Pen INJECT 50 UNITS  DAILY MAY INCREASE 2U EVERY 3 DAYS UNTIL FBG IS AT GOAL UP TO 80 UNITS DAILY 22.5 mL 6   valsartan (DIOVAN) 320 MG tablet Take 1 tablet (320 mg total) by mouth daily. 90 tablet 3   Vitamin D, Cholecalciferol, 1000 units TABS Take 1,000 Units by mouth daily.      zolpidem (AMBIEN) 5 MG tablet Take 1 tablet (5 mg total) by mouth at bedtime as needed for sleep. Stop trazadone not helpful 30 tablet 0   No current facility-administered medications on file prior to visit.     Allergies:   Cortisone, Metformin, and Metformin and related   Social History   Tobacco Use   Smoking status: Never   Smokeless tobacco: Never  Vaping Use   Vaping Use: Never used  Substance Use Topics   Alcohol use: Yes    Comment: occ cocktail   Drug use: No     Family Hx: The patient's family history includes Heart disease in his father; Lung cancer in his mother. There is no history of Colon cancer or Breast cancer.  ROS:   Please see the history of present illness.    Review of Systems  Constitutional: Negative.   HENT: Negative.    Respiratory:  Positive for shortness of breath.   Cardiovascular:  Positive for leg swelling.  Gastrointestinal: Negative.   Musculoskeletal: Negative.   Neurological: Negative.   Psychiatric/Behavioral: Negative.    All other systems reviewed and are negative.   Labs/Other Tests and Data Reviewed:    Recent Labs: 05/15/2020: Magnesium 1.8 07/22/2020: BUN 16; Creatinine, Ser 1.31; Potassium 4.3; Sodium 140 11/12/2020: ALT 12 12/03/2020: Hemoglobin 12.1; Platelets 95   Recent Lipid Panel Lab Results  Component Value Date/Time   CHOL 93 03/11/2020 03:25 PM   CHOL 125 12/27/2017 02:46 PM   TRIG 163.0 (H) 03/11/2020 03:25 PM   TRIG 289 (H) 12/27/2017 02:46 PM   HDL 37.60 (L) 03/11/2020 03:25 PM   HDL 39 (L) 07/04/2014 08:01 AM   CHOLHDL 2 03/11/2020 03:25 PM   LDLCALC 23 03/11/2020 03:25 PM   LDLCALC 110 (H) 07/04/2014 08:01 AM   LDLDIRECT 65.0 06/02/2016 02:17 PM     Wt Readings from Last 3 Encounters:  12/04/20 226 lb 2 oz (102.6 kg)  12/03/20 224 lb (101.6 kg)  11/12/20 220 lb (99.8 kg)     Exam:    BP (!) 142/58 (BP Location: Left Arm, Patient Position: Sitting, Cuff Size: Normal)   Pulse 68  Wt 226 lb 2 oz (102.6 kg)   SpO2 98%   BMI 30.67 kg/m   Constitutional:  oriented to person, place, and time. No distress.  HENT:  Head: Grossly normal Eyes:  no discharge. No scleral icterus.  Neck: No JVD, no carotid bruits  Cardiovascular: Regular rate and rhythm, no murmurs appreciated Pulmonary/Chest: Clear to auscultation bilaterally, no wheezes or rails Abdominal: Soft.  no distension.  no tenderness.  Musculoskeletal: Normal range of motion Neurological:  normal muscle tone. Coordination normal. No atrophy Skin: Skin warm and dry Psychiatric: normal affect, pleasant   ASSESSMENT & PLAN:    Coronary artery disease of native artery of native heart with stable angina pectoris ([place]) Reports worsening shortness of breath concerning for ischemia Reports it feels just like it did prior to his bypass surgery in 2006 He does have chronic baseline shortness of breath but reports that shortness of breath is now severe with minimal exertion  Indicated he was concerned about new blockage Discussed various treatment options with him, he prefers cardiac catheterization Prior catheterization 2019 discussed with him At that time with 50% graft disease to distal RCA, 50% mid left circumflex, unclear if these lesions have worsened I have reviewed the risks, indications, and alternatives to cardiac catheterization, possible angioplasty, and stenting with the patient. Risks include but are not limited to bleeding, infection, vascular injury, stroke, myocardial infection, arrhythmia, kidney injury, radiation-related injury in the case of prolonged fluoroscopy use, emergency cardiac surgery, and death. The patient understands the risks of serious  complication is 1-2 in 8242 with diagnostic cardiac cath and 1-2% or less with angioplasty/stenting.  -- We will schedule him for cardiac catheterization at [facility] early next week with Dr. Saunders Revel October 4.  Given severity of symptoms, we tried to get him sooner on the third but schedule was full We will plan on right and left heart catheterization given history of pulmonary hypertension  Chronic stable angina, Worsening shortness of breath, plan as above  Pulmonary hypertension Previous PFTs, obstructive disease,  Chronic  shortness of breath worse recently Lasix previously held in the setting of renal dysfunction  Right heart catheterization as above  Chronic diastolic CHF (congestive heart failure) (HCC) Weight trending upwards, will hold off on restarting Lasix until after right heart catheterization  Type 2 diabetes mellitus with complication, without long-term current use of insulin (HCC) weight loss, strict low carbohydrate diet  Mixed hyperlipidemia Cholesterol is at goal on the current lipid regimen. No changes to the medications were made.  Essential hypertension Blood pressure elevated, recommend he stop amlodipine given leg swelling Suggest he start Cardura 2 mg twice daily Recommend he call us with blood pressure measurements  Stable angina (HCC) Continue current medications Cholesterol at goal  S/P CABG x 4 Prior catheterization in the 2019  Anginal equivalent symptoms as above, plan for catheterization  CKD (chronic kidney disease) stage 3, GFR 30-59 ml/min (HCC) Recheck BMP today prior to catheterization   Total encounter time more than 45 minutes  Greater than 50% was spent in counseling and coordination of care with the patient    Signed, Ida Rogue, MD  12/04/2020 12:54 PM    [place] Office [address], [place], [place] [address]

## 2020-12-02 NOTE — Telephone Encounter (Signed)
Was able to reach back out to [name] [name], he reports SOB, pt has hx of chronic SOB, but pt reports at night when he laying down having some wheezing and unable to walk long distance w/o having to stop and rest d/t SOB.   Reports was on a fluid pill (furosemide) but was d/c 04/10/2020 by [name] [name] for acute kidney injury and bile duct blockage.   Reports has recently got back from a 10 day vacation, has some weight gain, but reports "that is normal after vacation, I was eating good". Denies any new swelling to LE or ABD. Stated no issues with BP as well.   Pt has an appt with [name] [name] on 9/30, this Friday for SOB evaluation, advised pt to keep appt, if his breathing gets worse or the wheezing is more persistent then just night time, then call PCP for a CXR or seek the ED for further evaluation. Otherwise, SOB is chronic, if no changes, then to keep appt Friday, pt verbalized understanding and is thankful for calling back.

## 2020-12-02 NOTE — H&P (View-Only) (Signed)
I connected with  [name] [name] on 12/04/20   Evaluation Performed:  Follow-up visit  Date:  12/04/2020   ID:  [name], [name] 10-Mar-1933, MRN [id number]  Patient Location:  [address] [id number]   Provider location:   [facility], [place] office  PCP:  [name], MD  Cardiologist:  Arvid Right [facility]   Chief Complaint  Patient presents with   Shortness of Breath    Patient c/o shortness of breath with little to no exertion. Medications reviewed by the patient verbally.     History of Present Illness:    [name] [name] is a 87 y.o. male  past medical history of coronary artery disease, bypass surgery in November 2006, Stress test March 2017, cath 09/2015, medical management Occluded proximal LAD  Critical/severe native RCA disease  Vein graft to the diagonal is occluded  Other vein grafts patent to the RCA/PDA, vein graft to the OM 3, LIMA graft  EF 55% in 12/2013 diabetes, numbers running high obesity,  hyperlipidemia,  hypertension,  erectile dysfunction Chronic SOB, nonsmoker Postherpetic neuralgia left flank Stable angina, chronic diastolic CHF, chronic shortness of breath Pulmonary hypertension on right heart catheterization who presents for routine followup of his coronary artery disease, and hypertension  In follow-up today he reports he is not doing well Reports worsening shortness of breath Symptoms similar to prior to CABG in 2006  Unable to walk in his house without having to stop sit down and recover Wife who presents with him today also concerned that his activities have dramatically changed, much more short of breath than usual  Denies leg swelling Compliant with his medications  Low but stable platelets over the past year around 100  Last catheterization 2019, at that time had 50% to mid left circumflex, 50% stenosis vein graft to the PDA LIMA was patent to the LAD  EKG personally  reviewed by myself on todays visit Normal sinus rhythm rate 68 bpm PACs nonspecific ST abnormality anterolateral leads inferior leads, right bundle branch block   past medical history reviewed 05/2020: in hospital , [facility] records reviewed  COVID April 2022 treated with paxlovid  Prior echocardiogram in heart catheterization November 2019 Moderate pulmonary hypertension, Severe three-vessel disease Occluded proximal RCA, occluded ostial LAD LIMA graft to the LAD patent, vein graft to the PDA patent The findings this catheterization shows occluded vein graft to the OM 3, now occluded proximal native RCA  In the hospital February 2022 right upper quadrant abdominal pain with associated nausea and vomiting as well as fever with a T-max of one 1.5 for the last 3 to 4 days with associated generalized fatigue and tiredness.   cholestasis with elevated alkaline phosphatase and direct hyperbilirubinemia GGT elevated more than 200 which is consistent with hepatic origin of elevated alkaline phosphatase Acute hepatitis panel negative   Past Medical History:  Diagnosis Date   (HFpEF) heart failure with preserved ejection fraction ([place])    a. 11/8919 Echo: nl systolic fxn, mild LVH, diastolic relaxation abnormality, mildly enlarged LA, mild Ao insufficiency; 01/2018 Echo: EF 55-60%, no rwma, GR2 DD, triv AI, mildly dil LA, mild inc PASP.   BPH (benign prostatic hyperplasia)    Cancer (HCC)    skin   Cataract    Choledocholithiasis    a. 05/2020 ERCP w/ CBD stone removal and biliary sphincterotomy.   Coronary artery disease    a. 01/2005 s/p CABG x  4: LIMA-LAD, VG-Diag, VG-OM3, VG-dRCA; b. 06/2013 Cath: patent grafts; c. 09/2015 Cath: VG->Diag 100, other grafts patent. LAD 100, RCA sev dzs->Med rx; d. 01/2018 Cath: LAD 100ost/p, LCX 50, RCA 100p/d, VG->OM3 100, VG->Diag 100, LIMA->LAD ok, VG->RPAV 30m-->Med rx.   COVID-19    07/2020 had paxlovid    Dyspnea    with exertion   Dysrhythmia     ED (erectile dysfunction)    Elevated LFTs    a. 05/2020 following CBD stone. 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   GERD (gastroesophageal reflux disease)    Hyperlipidemia    Hypertension    IDDM (insulin dependent diabetes mellitus) 2000   Osteoarthritis    PAH (pulmonary artery hypertension) ([place])    a. 01/2018 RHC: PA 51/22 (32).   Perirectal fistula    Pneumonia 12/2016   Shingles 09/04/2018   Tubular adenoma of colon 07/2012   Vertigo    Past Surgical History:  Procedure Laterality Date   BACK SURGERY     CARDIAC CATHETERIZATION  06/05/2013   [facility]    CARDIAC CATHETERIZATION N/A 09/24/2015   Procedure: Right Heart Cath and Coronary/Graft Angiography;  Surgeon: Minna Merritts, MD;  Location: [place] CV LAB;  Service: Cardiovascular;  Laterality: N/A;   CATARACT EXTRACTION  sept 2013   right   CATARACT EXTRACTION W/PHACO Left 03/28/2017   Procedure: CATARACT EXTRACTION PHACO AND INTRAOCULAR LENS PLACEMENT (IOC);  Surgeon: Birder Robson, MD;  Location: [facility] ORS;  Service: Ophthalmology;  Laterality: Left;  [place] 00:42.0 AP% 15.0 CDE 6.31 Fluid Pack lot # [id number]   CHOLECYSTECTOMY  05/15/2011   Procedure: LAPAROSCOPIC CHOLECYSTECTOMY;  Surgeon: Rolm Bookbinder, MD;  Location: [place];  Service: General;  Laterality: N/A;   COLONOSCOPY W/ POLYPECTOMY     CORONARY ARTERY BYPASS GRAFT  2007   x 4   ERCP N/A 05/05/2020   Procedure: ENDOSCOPIC RETROGRADE CHOLANGIOPANCREATOGRAPHY (ERCP);  Surgeon: Lucilla Lame, MD;  Location: [facility] ENDOSCOPY;  Service: Endoscopy;  Laterality: N/A;   ERCP N/A 05/12/2020   Procedure: ENDOSCOPIC RETROGRADE CHOLANGIOPANCREATOGRAPHY (ERCP);  Surgeon: Lucilla Lame, MD;  Location: [facility] ENDOSCOPY;  Service: Endoscopy;  Laterality: N/A;   ESOPHAGOGASTRODUODENOSCOPY N/A 05/12/2020   Procedure: ESOPHAGOGASTRODUODENOSCOPY (EGD);  Surgeon: Lucilla Lame, MD;  Location: [facility] ENDOSCOPY;  Service: Endoscopy;   Laterality: N/A;   EYE SURGERY     FOOT SURGERY  2012   right foot   JOINT REPLACEMENT  8/10   Right THR--[name]   LEFT AND RIGHT HEART CATHETERIZATION WITH CORONARY/GRAFT ANGIOGRAM N/A 06/05/2013   Procedure: LEFT AND RIGHT HEART CATHETERIZATION WITH Beatrix Fetters;  Surgeon: Peter M Martinique, MD;  Location: [facility] CATH LAB;  Service: Cardiovascular;  Laterality: N/A;   LUMBAR LAMINECTOMY  1989   Prostate photovaporization  5/16   Dr Budd Palmer   RIGHT/LEFT HEART CATH AND CORONARY/GRAFT ANGIOGRAPHY N/A 01/05/2018   Procedure: RIGHT/LEFT HEART CATH AND CORONARY/GRAFT ANGIOGRAPHY;  Surgeon: Minna Merritts, MD;  Location: [place] CV LAB;  Service: Cardiovascular;  Laterality: N/A;     Current Outpatient Medications on File Prior to Visit  Medication Sig Dispense Refill   aspirin EC 81 MG tablet Take 81 mg by mouth every other day.     BD PEN NEEDLE NANO U/F 32G X 4 MM MISC USE AS DIRECTED 100 each 0   carvedilol (COREG) 12.5 MG tablet TAKE ONE (1) TABLET BY MOUTH TWO TIMES PER DAY 180 tablet 0   doxazosin (CARDURA) 2 MG tablet Take  1 tablet (2 mg total) by mouth 2 (two) times daily. 60 tablet 6   finasteride (PROSCAR) 5 MG tablet Take 1 tablet (5 mg total) by mouth daily. 90 tablet 1   gabapentin (NEURONTIN) 100 MG capsule TAKE 1 CAPSULE BY MOUTH 3 TIMES DAILY (Patient taking differently: Take 100 mg by mouth 2 (two) times daily.) 90 capsule 1   hydrALAZINE (APRESOLINE) 25 MG tablet Take 25 mg by mouth in the morning and at bedtime.     HYDROcodone-acetaminophen (NORCO) 5-325 MG tablet Take 1 tablet by mouth 3 (three) times daily as needed for moderate pain. 90 tablet 0   loperamide (IMODIUM A-D) 2 MG tablet Take 1 tablet (2 mg total) by mouth 2 (two) times daily as needed for up to 60 doses for diarrhea or loose stools. 60 tablet 0   omeprazole (PRILOSEC) 20 MG capsule TAKE 1 CAPSULE BY MOUTH TWICE DAILY 60 capsule 1   TOUJEO SOLOSTAR 300 UNIT/ML Solostar Pen INJECT 50 UNITS  DAILY MAY INCREASE 2U EVERY 3 DAYS UNTIL FBG IS AT GOAL UP TO 80 UNITS DAILY 22.5 mL 6   valsartan (DIOVAN) 320 MG tablet Take 1 tablet (320 mg total) by mouth daily. 90 tablet 3   Vitamin D, Cholecalciferol, 1000 units TABS Take 1,000 Units by mouth daily.      zolpidem (AMBIEN) 5 MG tablet Take 1 tablet (5 mg total) by mouth at bedtime as needed for sleep. Stop trazadone not helpful 30 tablet 0   No current facility-administered medications on file prior to visit.     Allergies:   Cortisone, Metformin, and Metformin and related   Social History   Tobacco Use   Smoking status: Never   Smokeless tobacco: Never  Vaping Use   Vaping Use: Never used  Substance Use Topics   Alcohol use: Yes    Comment: occ cocktail   Drug use: No     Family Hx: The patient's family history includes Heart disease in his father; Lung cancer in his mother. There is no history of Colon cancer or Breast cancer.  ROS:   Please see the history of present illness.    Review of Systems  Constitutional: Negative.   HENT: Negative.    Respiratory:  Positive for shortness of breath.   Cardiovascular:  Positive for leg swelling.  Gastrointestinal: Negative.   Musculoskeletal: Negative.   Neurological: Negative.   Psychiatric/Behavioral: Negative.    All other systems reviewed and are negative.   Labs/Other Tests and Data Reviewed:    Recent Labs: 05/15/2020: Magnesium 1.8 07/22/2020: BUN 16; Creatinine, Ser 1.31; Potassium 4.3; Sodium 140 11/12/2020: ALT 12 12/03/2020: Hemoglobin 12.1; Platelets 95   Recent Lipid Panel Lab Results  Component Value Date/Time   CHOL 93 03/11/2020 03:25 PM   CHOL 125 12/27/2017 02:46 PM   TRIG 163.0 (H) 03/11/2020 03:25 PM   TRIG 289 (H) 12/27/2017 02:46 PM   HDL 37.60 (L) 03/11/2020 03:25 PM   HDL 39 (L) 07/04/2014 08:01 AM   CHOLHDL 2 03/11/2020 03:25 PM   LDLCALC 23 03/11/2020 03:25 PM   LDLCALC 110 (H) 07/04/2014 08:01 AM   LDLDIRECT 65.0 06/02/2016 02:17 PM     Wt Readings from Last 3 Encounters:  12/04/20 226 lb 2 oz (102.6 kg)  12/03/20 224 lb (101.6 kg)  11/12/20 220 lb (99.8 kg)     Exam:    BP (!) 142/58 (BP Location: Left Arm, Patient Position: Sitting, Cuff Size: Normal)   Pulse 68  Wt 226 lb 2 oz (102.6 kg)   SpO2 98%   BMI 30.67 kg/m   Constitutional:  oriented to person, place, and time. No distress.  HENT:  Head: Grossly normal Eyes:  no discharge. No scleral icterus.  Neck: No JVD, no carotid bruits  Cardiovascular: Regular rate and rhythm, no murmurs appreciated Pulmonary/Chest: Clear to auscultation bilaterally, no wheezes or rails Abdominal: Soft.  no distension.  no tenderness.  Musculoskeletal: Normal range of motion Neurological:  normal muscle tone. Coordination normal. No atrophy Skin: Skin warm and dry Psychiatric: normal affect, pleasant   ASSESSMENT & PLAN:    Coronary artery disease of native artery of native heart with stable angina pectoris ([place]) Reports worsening shortness of breath concerning for ischemia Reports it feels just like it did prior to his bypass surgery in 2006 He does have chronic baseline shortness of breath but reports that shortness of breath is now severe with minimal exertion  Indicated he was concerned about new blockage Discussed various treatment options with him, he prefers cardiac catheterization Prior catheterization 2019 discussed with him At that time with 50% graft disease to distal RCA, 50% mid left circumflex, unclear if these lesions have worsened I have reviewed the risks, indications, and alternatives to cardiac catheterization, possible angioplasty, and stenting with the patient. Risks include but are not limited to bleeding, infection, vascular injury, stroke, myocardial infection, arrhythmia, kidney injury, radiation-related injury in the case of prolonged fluoroscopy use, emergency cardiac surgery, and death. The patient understands the risks of serious  complication is 1-2 in 4982 with diagnostic cardiac cath and 1-2% or less with angioplasty/stenting.  -- We will schedule him for cardiac catheterization at [facility] early next week with Dr. Saunders Revel October 4.  Given severity of symptoms, we tried to get him sooner on the third but schedule was full We will plan on right and left heart catheterization given history of pulmonary hypertension  Chronic stable angina, Worsening shortness of breath, plan as above  Pulmonary hypertension Previous PFTs, obstructive disease,  Chronic  shortness of breath worse recently Lasix previously held in the setting of renal dysfunction  Right heart catheterization as above  Chronic diastolic CHF (congestive heart failure) (HCC) Weight trending upwards, will hold off on restarting Lasix until after right heart catheterization  Type 2 diabetes mellitus with complication, without long-term current use of insulin (HCC) weight loss, strict low carbohydrate diet  Mixed hyperlipidemia Cholesterol is at goal on the current lipid regimen. No changes to the medications were made.  Essential hypertension Blood pressure elevated, recommend he stop amlodipine given leg swelling Suggest he start Cardura 2 mg twice daily Recommend he call us with blood pressure measurements  Stable angina (HCC) Continue current medications Cholesterol at goal  S/P CABG x 4 Prior catheterization in the 2019  Anginal equivalent symptoms as above, plan for catheterization  CKD (chronic kidney disease) stage 3, GFR 30-59 ml/min (HCC) Recheck BMP today prior to catheterization   Total encounter time more than 45 minutes  Greater than 50% was spent in counseling and coordination of care with the patient    Signed, Ida Rogue, MD  12/04/2020 12:54 PM    [place] Office [address], [place], [place] [address]

## 2020-12-02 NOTE — Telephone Encounter (Signed)
Pt c/o Shortness Of Breath: STAT if SOB developed within the last 24 hours or pt is noticeably SOB on the phone  1. Are you currently SOB (can you hear that pt is SOB on the phone)? Yes not audible   2. How long have you been experiencing SOB? 2 weeks getting worse   3. Are you SOB when sitting or when up moving around? When in bed  flat wheezing and worse with exertion   4. Are you currently experiencing any other symptoms? No    Patient requested asap visit .  Scheduled 9-30 [place].

## 2020-12-03 ENCOUNTER — Ambulatory Visit: Payer: Medicare Other | Admitting: Gastroenterology

## 2020-12-03 ENCOUNTER — Other Ambulatory Visit: Payer: Self-pay | Admitting: Gastroenterology

## 2020-12-03 ENCOUNTER — Encounter: Payer: Self-pay | Admitting: Gastroenterology

## 2020-12-03 ENCOUNTER — Other Ambulatory Visit: Payer: Self-pay

## 2020-12-03 VITALS — BP 161/65 | HR 71 | Temp 98.5°F | Wt 224.0 lb

## 2020-12-03 DIAGNOSIS — D649 Anemia, unspecified: Secondary | ICD-10-CM | POA: Diagnosis not present

## 2020-12-03 DIAGNOSIS — R109 Unspecified abdominal pain: Secondary | ICD-10-CM | POA: Diagnosis not present

## 2020-12-03 NOTE — Progress Notes (Signed)
[name], MD [address]  [place], [place] [address]  Main: [phone]  Fax: [phone]   Primary Care Physician: [name], MD   Chief Complaint  Patient presents with   Follow-up    Pt reports stools are more formed and not feeling as much urgency, overall doing better     HPI: [name] [name] is a 87 y.o. male here for follow-up stools.  He is now reporting that stools are more formed.  Describes stools as type II or III at this time compared to 5-6 on the last visit.  Is usually taking Imodium once a day at this point.  Does report some decrease in appetite.  No abdominal pain, nausea or vomiting.  No blood in stool.   ROS: All ROS reviewed and negative except as per HPI   Past Medical History:  Diagnosis Date   (HFpEF) heart failure with preserved ejection fraction ([place])    a. 09/1060 Echo: nl systolic fxn, mild LVH, diastolic relaxation abnormality, mildly enlarged LA, mild Ao insufficiency; 01/2018 Echo: EF 55-60%, no rwma, GR2 DD, triv AI, mildly dil LA, mild inc PASP.   BPH (benign prostatic hyperplasia)    Cancer (HCC)    skin   Cataract    Choledocholithiasis    a. 05/2020 ERCP w/ CBD stone removal and biliary sphincterotomy.   Coronary artery disease    a. 01/2005 s/p CABG x 4: LIMA-LAD, VG-Diag, VG-OM3, VG-dRCA; b. 06/2013 Cath: patent grafts; c. 09/2015 Cath: VG->Diag 100, other grafts patent. LAD 100, RCA sev dzs->Med rx; d. 01/2018 Cath: LAD 100ost/p, LCX 50, RCA 100p/d, VG->OM3 100, VG->Diag 100, LIMA->LAD ok, VG->RPAV 63m-->Med rx.   COVID-19    07/2020 had paxlovid    Dyspnea    with exertion   Dysrhythmia    ED (erectile dysfunction)    Elevated LFTs    a. 05/2020 following CBD stone. 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   GERD (gastroesophageal reflux disease)    Hyperlipidemia    Hypertension    IDDM (insulin dependent diabetes mellitus) 2000   Osteoarthritis     PAH (pulmonary artery hypertension) ([place])    a. 01/2018 RHC: PA 51/22 (32).   Perirectal fistula    Pneumonia 12/2016   Shingles 09/04/2018   Tubular adenoma of colon 07/2012   Vertigo     Past Surgical History:  Procedure Laterality Date   BACK SURGERY     CARDIAC CATHETERIZATION  06/05/2013   [facility].    CARDIAC CATHETERIZATION N/A 09/24/2015   Procedure: Right Heart Cath and Coronary/Graft Angiography;  Surgeon: Minna Merritts, MD;  Location: [place] CV LAB;  Service: Cardiovascular;  Laterality: N/A;   CATARACT EXTRACTION  sept 2013   right   CATARACT EXTRACTION W/PHACO Left 03/28/2017   Procedure: CATARACT EXTRACTION PHACO AND INTRAOCULAR LENS PLACEMENT (IOC);  Surgeon: Birder Robson, MD;  Location: [facility] ORS;  Service: Ophthalmology;  Laterality: Left;  [place] 00:42.0 AP% 15.0 CDE 6.31 Fluid Pack lot # [id number]   CHOLECYSTECTOMY  05/15/2011   Procedure: LAPAROSCOPIC CHOLECYSTECTOMY;  Surgeon: Rolm Bookbinder, MD;  Location: [place];  Service: General;  Laterality: N/A;   COLONOSCOPY W/ POLYPECTOMY     CORONARY ARTERY BYPASS GRAFT  2007   x 4   ERCP N/A 05/05/2020   Procedure: ENDOSCOPIC RETROGRADE CHOLANGIOPANCREATOGRAPHY (ERCP);  Surgeon: Lucilla Lame, MD;  Location: [facility] ENDOSCOPY;  Service: Endoscopy;  Laterality: N/A;  ERCP N/A 05/12/2020   Procedure: ENDOSCOPIC RETROGRADE CHOLANGIOPANCREATOGRAPHY (ERCP);  Surgeon: Lucilla Lame, MD;  Location: [facility] ENDOSCOPY;  Service: Endoscopy;  Laterality: N/A;   ESOPHAGOGASTRODUODENOSCOPY N/A 05/12/2020   Procedure: ESOPHAGOGASTRODUODENOSCOPY (EGD);  Surgeon: Lucilla Lame, MD;  Location: [facility] ENDOSCOPY;  Service: Endoscopy;  Laterality: N/A;   EYE SURGERY     FOOT SURGERY  2012   right foot   JOINT REPLACEMENT  8/10   Right THR--[name]   LEFT AND RIGHT HEART CATHETERIZATION WITH CORONARY/GRAFT ANGIOGRAM N/A 06/05/2013   Procedure: LEFT AND RIGHT HEART CATHETERIZATION WITH Beatrix Fetters;  Surgeon: Peter M Martinique, MD;   Location: [facility] CATH LAB;  Service: Cardiovascular;  Laterality: N/A;   LUMBAR LAMINECTOMY  1989   Prostate photovaporization  5/16   Dr Budd Palmer   RIGHT/LEFT HEART CATH AND CORONARY/GRAFT ANGIOGRAPHY N/A 01/05/2018   Procedure: RIGHT/LEFT HEART CATH AND CORONARY/GRAFT ANGIOGRAPHY;  Surgeon: Minna Merritts, MD;  Location: [place] CV LAB;  Service: Cardiovascular;  Laterality: N/A;    Prior to Admission medications   Medication Sig Start Date End Date Taking? Authorizing Provider  aspirin EC 81 MG tablet Take 81 mg by mouth every other day.   Yes [provider]  BD PEN NEEDLE NANO U/F 32G X 4 MM MISC USE AS DIRECTED 10/27/20  Yes [name], MD  carvedilol (COREG) 12.5 MG tablet TAKE ONE (1) TABLET BY MOUTH TWO TIMES PER DAY 10/07/20  Yes Gollan, Kathlene November, MD  doxazosin (CARDURA) 2 MG tablet Take 1 tablet (2 mg total) by mouth 2 (two) times daily. 09/02/20  Yes Minna Merritts, MD  finasteride (PROSCAR) 5 MG tablet Take 1 tablet (5 mg total) by mouth daily. 03/11/20  Yes [name], MD  gabapentin (NEURONTIN) 100 MG capsule TAKE 1 CAPSULE BY MOUTH 3 TIMES DAILY 08/05/20  Yes [name], MD  HYDROcodone-acetaminophen (NORCO) 5-325 MG tablet Take 1 tablet by mouth 3 (three) times daily as needed for moderate pain. 08/12/20  Yes McLean-Scocuzza, Nino Glow, MD  loperamide (IMODIUM A-D) 2 MG tablet Take 1 tablet (2 mg total) by mouth 2 (two) times daily as needed for up to 60 doses for diarrhea or loose stools. 11/13/20  Yes [name] B, MD  omeprazole (PRILOSEC) 20 MG capsule TAKE 1 CAPSULE BY MOUTH TWICE DAILY 11/15/20  Yes [name], MD  TOUJEO SOLOSTAR 300 UNIT/ML Solostar Pen INJECT 50 UNITS DAILY MAY INCREASE 2U EVERY 3 DAYS UNTIL FBG IS AT GOAL UP TO 80 UNITS DAILY 06/22/20  Yes [name], MD  valsartan (DIOVAN) 320 MG tablet Take 1 tablet (320 mg total) by mouth daily. 10/23/20  Yes Theora Gianotti, NP  Vitamin D,  Cholecalciferol, 1000 units TABS Take 1,000 Units by mouth daily.    Yes [provider]  zolpidem (AMBIEN) 5 MG tablet Take 1 tablet (5 mg total) by mouth at bedtime as needed for sleep. Stop trazadone not helpful 02/26/19  Yes [name], MD    Family History  Problem Relation Age of Onset   Lung cancer Mother    Heart disease Father    Colon cancer Neg Hx    Breast cancer Neg Hx      Social History   Tobacco Use   Smoking status: Never   Smokeless tobacco: Never  Vaping Use   Vaping Use: Never used  Substance Use Topics   Alcohol use: Yes    Comment: occ cocktail   Drug use: No  Allergies as of 12/03/2020 - Review Complete 12/03/2020  Allergen Reaction Noted   Cortisone Other (See Comments) 06/24/2016   Metformin Diarrhea 09/05/2016   Metformin and related Diarrhea 09/05/2016    Physical Examination:  Constitutional: General:   Alert,  Well-developed, well-nourished, pleasant and cooperative in NAD BP (!) 161/65   Pulse 71   Temp 98.5 F (36.9 C) (Oral)   Wt 224 lb (101.6 kg)   BMI 30.38 kg/m   Respiratory: Normal respiratory effort  Gastrointestinal:  Soft, mildly tender to palpation diffusely, and non-distended without masses, hepatosplenomegaly or hernias noted.  No guarding or rebound tenderness.     Cardiac: No clubbing or edema.  No cyanosis. Normal posterior tibial pedal pulses noted.  Psych:  Alert and cooperative. Normal mood and affect.  Musculoskeletal:  Normal gait. Head normocephalic, atraumatic. Symmetrical without gross deformities. 5/5 Lower extremity strength bilaterally.  Skin: Warm. Intact without significant lesions or rashes. No jaundice.  Neck: Supple, trachea midline  Lymph: No cervical lymphadenopathy  Psych:  Alert and oriented x3, Alert and cooperative. Normal mood and affect.  Labs: CMP     Component Value Date/Time   NA 140 07/22/2020 1402   NA 139 02/22/2013 1654   K 4.3 07/22/2020 1402   K 4.5  02/22/2013 1654   CL 107 (H) 07/22/2020 1402   CL 109 (H) 02/22/2013 1654   CO2 20 07/22/2020 1402   CO2 24 02/22/2013 1654   GLUCOSE 243 (H) 07/22/2020 1402   GLUCOSE 156 (H) 06/30/2020 1231   GLUCOSE 197 (H) 02/22/2013 1654   BUN 16 07/22/2020 1402   BUN 20 (H) 02/22/2013 1654   CREATININE 1.31 (H) 07/22/2020 1402   CREATININE 1.18 02/22/2013 1654   CREATININE 1.13 05/25/2012 1630   CALCIUM 8.5 (L) 07/22/2020 1402   CALCIUM 9.1 02/22/2013 1654   PROT 6.1 11/12/2020 1520   PROT 6.2 (L) 02/22/2013 1654   ALBUMIN 3.6 11/12/2020 1520   ALBUMIN 3.5 02/22/2013 1654   AST 18 11/12/2020 1520   AST 20 02/22/2013 1654   ALT 12 11/12/2020 1520   ALT 42 02/22/2013 1654   ALKPHOS 155 (H) 11/12/2020 1520   ALKPHOS 113 02/22/2013 1654   BILITOT 0.5 11/12/2020 1520   BILITOT 0.6 02/22/2013 1654   GFRNONAA 47 (L) 06/30/2020 1231   GFRNONAA 59 (L) 02/22/2013 1654   GFRAA 53 (L) 04/27/2020 0930   GFRAA >60 02/22/2013 1654   Lab Results  Component Value Date   WBC 5.2 05/15/2020   HGB 11.5 (L) 05/15/2020   HCT 34.1 (L) 05/15/2020   MCV 94.7 05/15/2020   PLT 104 (L) 05/15/2020    Imaging Studies:   Assessment and Plan:   [name] [name] is a 87 y.o. y/o male with history of C. difficile here for follow-up  Stools are now more formed Patient continue to decrease Imodium and only use as needed Not having any further symptoms of C. difficile diarrhea If symptoms change, patient is asked to notify her next  Patient did report decrease in appetite overall since his hospitalizations in February/March 2022, and since the episode of C. difficile.  Although he is not having any pain with eating or abdominal pain in general, on palpation of his abdomen today, he is diffusely tender, although abdomen is soft.  With the decrease in his appetite, and what he describes as abdominal soreness, and pain on palpation diffusely, I will obtain CT abdomen pelvis to ensure there is no signs of  inflammation  His  anemia is normocytic.  I will recheck, to ensure this is stable.  After results are available, can refer to hematology for chronic anemia and thrombocytopenia    Dr [name]

## 2020-12-04 ENCOUNTER — Other Ambulatory Visit: Payer: Self-pay | Admitting: Family Medicine

## 2020-12-04 ENCOUNTER — Encounter: Payer: Self-pay | Admitting: Cardiovascular Disease

## 2020-12-04 ENCOUNTER — Ambulatory Visit: Payer: Medicare Other | Admitting: Cardiovascular Disease

## 2020-12-04 VITALS — BP 142/58 | HR 68 | Wt 226.1 lb

## 2020-12-04 DIAGNOSIS — I2721 Secondary pulmonary arterial hypertension: Secondary | ICD-10-CM

## 2020-12-04 DIAGNOSIS — E785 Hyperlipidemia, unspecified: Secondary | ICD-10-CM

## 2020-12-04 DIAGNOSIS — E118 Type 2 diabetes mellitus with unspecified complications: Secondary | ICD-10-CM

## 2020-12-04 DIAGNOSIS — N183 Chronic kidney disease, stage 3 unspecified: Secondary | ICD-10-CM | POA: Diagnosis not present

## 2020-12-04 DIAGNOSIS — I5032 Chronic diastolic (congestive) heart failure: Secondary | ICD-10-CM | POA: Diagnosis not present

## 2020-12-04 DIAGNOSIS — I10 Essential (primary) hypertension: Secondary | ICD-10-CM

## 2020-12-04 DIAGNOSIS — Z01818 Encounter for other preprocedural examination: Secondary | ICD-10-CM | POA: Diagnosis not present

## 2020-12-04 DIAGNOSIS — Z951 Presence of aortocoronary bypass graft: Secondary | ICD-10-CM

## 2020-12-04 DIAGNOSIS — Z01812 Encounter for preprocedural laboratory examination: Secondary | ICD-10-CM

## 2020-12-04 DIAGNOSIS — I25118 Atherosclerotic heart disease of native coronary artery with other forms of angina pectoris: Secondary | ICD-10-CM

## 2020-12-04 LAB — CBC
Hematocrit: 36.3 % — ABNORMAL LOW (ref 37.5–51.0)
Hemoglobin: 12.1 g/dL — ABNORMAL LOW (ref 13.0–17.7)
MCH: 31.7 pg (ref 26.6–33.0)
MCHC: 33.3 g/dL (ref 31.5–35.7)
MCV: 95 fL (ref 79–97)
Platelets: 95 10*3/uL — CL (ref 150–450)
RBC: 3.82 x10E6/uL — ABNORMAL LOW (ref 4.14–5.80)
RDW: 12.5 % (ref 11.6–15.4)
WBC: 5.5 10*3/uL (ref 3.4–10.8)

## 2020-12-04 NOTE — Patient Instructions (Addendum)
Medication Instructions:  - Your physician recommends that you continue on your current medications as directed. Please refer to the Current Medication list given to you today.  *If you need a refill on your cardiac medications before your next appointment, please call your pharmacy*   Lab Work: - Your physician recommends that you have lab work today: BMP  If you have labs (blood work) drawn today and your tests are completely normal, you will receive your results only by: MyChart Message (if you have MyChart) OR A paper copy in the mail If you have any lab test that is abnormal or we need to change your treatment, we will call you to review the results.   Testing/Procedures: - Your physician has requested that you have a cardiac catheterization. Cardiac catheterization is used to diagnose and/or treat various heart conditions. Doctors may recommend this procedure for a number of different reasons. The most common reason is to evaluate chest pain. Chest pain can be a symptom of coronary artery disease (CAD), and cardiac catheterization can show whether plaque is narrowing or blocking your heart's arteries. This procedure is also used to evaluate the valves, as well as measure the blood flow and oxygen levels in different parts of your heart.    [address], [place] [address] Dept: [phone] Loc: [place]  12/04/2020  You are scheduled for a Cardiac Catheterization on Tuesday, October 4 with Dr. Harrell Gave End.  1. Please arrive at the [facility] at 11:30 AM (This time is one hour before your procedure to ensure your preparation). Free valet parking service is available.   Once you enter the hospital, proceed to the 1st desk on the right, Registration, to check in  Special note: Every effort is made to have your procedure done on  time. Please understand that emergencies sometimes delay scheduled procedures.  2. Diet: Do not eat solid foods after midnight.  You may have clear liquids until 5am upon the day of the procedure.  3. Labs: today (12/04/20)  4. Medication instructions in preparation for your procedure:   Contrast Allergy: No  - HOLD all diabetic oral medications/ insulin the morning of your procedure - If you take any night time insulin, please take only 1/2 of your usual dose the night prior to your procedure  On the morning of your procedure, take your Aspirin and any morning medicines NOT listed above.  You may use sips of water.  5. Plan for one night stay--bring personal belongings. 6. Bring a current list of your medications and current insurance cards. 7. You MUST have a responsible person to drive you home. 8. Someone MUST be with you the first 24 hours after you arrive home or your discharge will be delayed. 9. Please wear clothes that are easy to get on and off and wear slip-on shoes.  Thank you for allowing [place] to care for you!   -- [facility]   Follow-Up: At [facility], you and your health needs are our priority.  As part of our continuing mission to provide you with exceptional heart care, we have created designated Provider Care Teams.  These Care Teams include your primary Cardiologist (physician) and Advanced Practice Providers (APPs -  Physician Assistants and Nurse Practitioners) who all work together to provide you with the care you need, when you need it.  We recommend signing  up for the patient portal called "MyChart".  Sign up information is provided on this After Visit Summary.  MyChart is used to connect with patients for Virtual Visits (Telemedicine).  Patients are able to view lab/test results, encounter notes, upcoming appointments, etc.  Non-urgent messages can be sent to your provider as well.   To learn more about what you can do with  MyChart, go to [email or web address].    Your next appointment:   2-3 week(s)  The format for your next appointment:   In Person  Provider:   You may see Ida Rogue, MD or one of the following Advanced Practice Providers on your designated Care Team:   [name], NP Christell Faith, PA-C Marrianne Mood, PA-C Cadence Kathlen Mody, [place]   Other Instructions   Coronary Angiogram A coronary angiogram is an X-ray procedure that is used to examine the arteries in the heart. Contrast dye is injected through a long, thin tube (catheter) into these arteries. Then X-rays are taken to show any blockage in these arteries. You may have this procedure if you: Are having chest pain, or other symptoms of angina, and you are at risk for heart disease. Have an abnormal stress test or test of your heart's electrical activity (electrocardiogram, or ECG). Have chest pain and heart failure. Are having irregular heart rhythms. A coronary angiogram or heart catheterization can show if you have valve disease or a disease of the aorta. This procedure can also be used to check the overall function of your heart muscle. Let your health care provider know about: Any allergies you have, including allergies to medicines or contrast dye. All medicines you are taking, including vitamins, herbs, eye drops, creams, and over-the-counter medicines. Any problems you or family members have had with anesthetic medicines. Any blood disorders you have. Any surgeries you have had. Any history of kidney problems or kidney failure. Any medical conditions you have. Whether you are pregnant or may be pregnant. Whether you are breastfeeding. What are the risks? Generally, this is a safe procedure. However, problems may occur, including: Infection. Allergic reaction to medicines or dyes that are used. Bleeding from the insertion site or other places. Damage to nearby structures, such as blood vessels, or damage to  kidneys from contrast dye. Irregular heart rhythms. Stroke (rare). Heart attack (rare). What happens before the procedure? Staying hydrated Follow instructions from your health care provider about hydration, which may include: Up to 2 hours before the procedure - you may continue to drink clear liquids, such as water, clear fruit juice, black coffee, and plain tea.  Eating and drinking restrictions Follow instructions from your health care provider about eating and drinking, which may include: 8 hours before the procedure - stop eating heavy meals or foods, such as meat, fried foods, or fatty foods. 6 hours before the procedure - stop eating light meals or foods, such as toast or cereal. 6 hours before the procedure - stop drinking milk or drinks that contain milk. 2 hours before the procedure - stop drinking clear liquids. Medicines Ask your health care provider about: Changing or stopping your regular medicines. This is especially important if you are taking diabetes medicines or blood thinners. Taking medicines such as aspirin and ibuprofen. These medicines can thin your blood. Do not take these medicines unless your health care provider tells you to take them. Aspirin may be recommended before coronary angiograms even if you do not normally take it. Taking over-the-counter medicines, vitamins, herbs, and supplements. General instructions Do  not use any products that contain nicotine or tobacco for at least 4 weeks before the procedure. These products include cigarettes, e-cigarettes, and chewing tobacco. If you need help quitting, ask your health care provider. You may have an exam or testing. Plan to have someone take you home from the hospital or clinic. If you will be going home right after the procedure, plan to have someone with you for 24 hours. Ask your health care provider: How your insertion site will be marked. What steps will be taken to help prevent infection. These may  include: Removing hair at the insertion site. Washing skin with a germ-killing soap. Taking antibiotic medicine. What happens during the procedure?  You will lie on your back on an X-ray table. An IV will be inserted into one of your veins. Electrodes will be placed on your chest. You will be given one or more of the following: A medicine to help you relax (sedative). A medicine to numb the catheter insertion area (local anesthetic). You will be connected to a continuous ECG monitor. The catheter will be inserted into an artery in one of these areas: Your groin area in your upper thigh. Your wrist. The fold of your arm, near your elbow. An X-ray procedure (fluoroscopy) will be used to help guide the catheter to the opening of the blood vessel to be used. A dye will be injected into the catheter and X-rays will be taken. The dye will help to show any narrowing or blockages in the heart arteries. Tell your health care provider if you have chest pain or trouble breathing. If blockages are found, another procedure may be done to open the artery. The catheter will be removed after the fluoroscopy is complete. A bandage (dressing) will be placed over the insertion site. Pressure will be applied to stop bleeding. The IV will be removed. The procedure may vary among health care providers and hospitals. What happens after the procedure? Your blood pressure, heart rate, breathing rate, and blood oxygen level will be monitored until you leave the hospital or clinic. You will need to lie still for a few hours, or for as long as told by your health care provider. If the procedure is done through the groin, you will be told not to bend or cross your legs. The insertion site and the pulse in your foot or wrist will be checked often. More blood tests, X-rays, and an ECG may be done. Do not drive for 24 hours if you were given a sedative during your procedure. Summary A coronary angiogram is an X-ray  procedure that is used to examine the arteries in the heart. Contrast dye is injected through a long, thin tube (catheter) into each artery. Tell your health care provider about any allergies you have, including allergies to contrast dye. After the procedure, you will need to lie still for a few hours and drink plenty of fluids. This information is not intended to replace advice given to you by your health care provider. Make sure you discuss any questions you have with your health care provider. Document Revised: 09/13/2018 Document Reviewed: 09/13/2018 Elsevier Patient Education  [place].

## 2020-12-04 NOTE — Addendum Note (Signed)
Addended by: Lurlean Nanny on: 12/04/2020 10:36 AM   Modules accepted: Orders

## 2020-12-05 LAB — BASIC METABOLIC PANEL
BUN/Creatinine Ratio: 13 (ref 10–24)
BUN: 17 mg/dL (ref 8–27)
CO2: 18 mmol/L — ABNORMAL LOW (ref 20–29)
Calcium: 8.3 mg/dL — ABNORMAL LOW (ref 8.6–10.2)
Chloride: 106 mmol/L (ref 96–106)
Creatinine, Ser: 1.26 mg/dL (ref 0.76–1.27)
Glucose: 264 mg/dL — ABNORMAL HIGH (ref 70–99)
Potassium: 4.3 mmol/L (ref 3.5–5.2)
Sodium: 139 mmol/L (ref 134–144)
eGFR: 56 mL/min/{1.73_m2} — ABNORMAL LOW (ref >59)

## 2020-12-07 ENCOUNTER — Telehealth: Payer: Self-pay | Admitting: Oncology

## 2020-12-07 NOTE — Telephone Encounter (Signed)
We received a referral for patient to be seen for a follow-up (anemia). Left patient a VM and requested that he call back to get that set up  on 10/12.

## 2020-12-08 ENCOUNTER — Encounter
Admission: RE | Disposition: A | Discharge status: Home / Self Care | Payer: Self-pay | Source: Home / Self Care | Attending: Internal Medicine

## 2020-12-08 ENCOUNTER — Encounter: Payer: Self-pay | Admitting: Internal Medicine

## 2020-12-08 ENCOUNTER — Observation Stay
Admission: RE | Admit: 2020-12-08 | Discharge: 2020-12-09 | Disposition: A | Discharge status: Home / Self Care | Payer: Medicare Other | Attending: Internal Medicine | Admitting: Internal Medicine

## 2020-12-08 ENCOUNTER — Observation Stay: Admission: RE | Admit: 2020-12-08 | Payer: Medicare Other | Source: Home / Self Care | Admitting: Internal Medicine

## 2020-12-08 DIAGNOSIS — R0602 Shortness of breath: Secondary | ICD-10-CM | POA: Diagnosis present

## 2020-12-08 DIAGNOSIS — I272 Pulmonary hypertension, unspecified: Secondary | ICD-10-CM | POA: Diagnosis present

## 2020-12-08 DIAGNOSIS — Z8616 Personal history of COVID-19: Secondary | ICD-10-CM | POA: Insufficient documentation

## 2020-12-08 DIAGNOSIS — I25728 Atherosclerosis of autologous artery coronary artery bypass graft(s) with other forms of angina pectoris: Secondary | ICD-10-CM | POA: Diagnosis not present

## 2020-12-08 DIAGNOSIS — Z79899 Other long term (current) drug therapy: Secondary | ICD-10-CM | POA: Insufficient documentation

## 2020-12-08 DIAGNOSIS — Z85828 Personal history of other malignant neoplasm of skin: Secondary | ICD-10-CM | POA: Insufficient documentation

## 2020-12-08 DIAGNOSIS — Z951 Presence of aortocoronary bypass graft: Secondary | ICD-10-CM | POA: Diagnosis not present

## 2020-12-08 DIAGNOSIS — I2510 Atherosclerotic heart disease of native coronary artery without angina pectoris: Secondary | ICD-10-CM | POA: Diagnosis present

## 2020-12-08 DIAGNOSIS — I2582 Chronic total occlusion of coronary artery: Secondary | ICD-10-CM | POA: Diagnosis not present

## 2020-12-08 DIAGNOSIS — I200 Unstable angina: Secondary | ICD-10-CM | POA: Diagnosis present

## 2020-12-08 DIAGNOSIS — I25118 Atherosclerotic heart disease of native coronary artery with other forms of angina pectoris: Principal | ICD-10-CM | POA: Insufficient documentation

## 2020-12-08 DIAGNOSIS — I5032 Chronic diastolic (congestive) heart failure: Secondary | ICD-10-CM | POA: Diagnosis not present

## 2020-12-08 DIAGNOSIS — Z20822 Contact with and (suspected) exposure to covid-19: Secondary | ICD-10-CM | POA: Diagnosis not present

## 2020-12-08 DIAGNOSIS — N183 Chronic kidney disease, stage 3 unspecified: Secondary | ICD-10-CM | POA: Diagnosis present

## 2020-12-08 DIAGNOSIS — E1122 Type 2 diabetes mellitus with diabetic chronic kidney disease: Secondary | ICD-10-CM | POA: Insufficient documentation

## 2020-12-08 DIAGNOSIS — I130 Hypertensive heart and chronic kidney disease with heart failure and stage 1 through stage 4 chronic kidney disease, or unspecified chronic kidney disease: Secondary | ICD-10-CM | POA: Diagnosis not present

## 2020-12-08 DIAGNOSIS — Z96641 Presence of right artificial hip joint: Secondary | ICD-10-CM | POA: Diagnosis not present

## 2020-12-08 DIAGNOSIS — E118 Type 2 diabetes mellitus with unspecified complications: Secondary | ICD-10-CM | POA: Diagnosis present

## 2020-12-08 DIAGNOSIS — N400 Enlarged prostate without lower urinary tract symptoms: Secondary | ICD-10-CM | POA: Diagnosis present

## 2020-12-08 DIAGNOSIS — E119 Type 2 diabetes mellitus without complications: Secondary | ICD-10-CM | POA: Diagnosis present

## 2020-12-08 DIAGNOSIS — I5033 Acute on chronic diastolic (congestive) heart failure: Secondary | ICD-10-CM

## 2020-12-08 DIAGNOSIS — E785 Hyperlipidemia, unspecified: Secondary | ICD-10-CM | POA: Diagnosis present

## 2020-12-08 DIAGNOSIS — I25710 Atherosclerosis of autologous vein coronary artery bypass graft(s) with unstable angina pectoris: Secondary | ICD-10-CM | POA: Diagnosis not present

## 2020-12-08 DIAGNOSIS — N179 Acute kidney failure, unspecified: Secondary | ICD-10-CM | POA: Diagnosis present

## 2020-12-08 DIAGNOSIS — I10 Essential (primary) hypertension: Secondary | ICD-10-CM | POA: Diagnosis present

## 2020-12-08 DIAGNOSIS — I25110 Atherosclerotic heart disease of native coronary artery with unstable angina pectoris: Secondary | ICD-10-CM

## 2020-12-08 HISTORY — PX: RIGHT/LEFT HEART CATH AND CORONARY/GRAFT ANGIOGRAPHY: CATH118267

## 2020-12-08 HISTORY — PX: CORONARY STENT INTERVENTION: CATH118234

## 2020-12-08 LAB — POCT ACTIVATED CLOTTING TIME
Activated Clotting Time: 237 seconds
Activated Clotting Time: 260 seconds
Activated Clotting Time: 289 seconds

## 2020-12-08 LAB — RESP PANEL BY RT-PCR (FLU A&B, COVID) ARPGX2
Influenza A by PCR: NEGATIVE
Influenza B by PCR: NEGATIVE
SARS Coronavirus 2 by RT PCR: NEGATIVE

## 2020-12-08 LAB — GLUCOSE, CAPILLARY
Glucose-Capillary: 122 mg/dL — ABNORMAL HIGH (ref 70–99)
Glucose-Capillary: 103 mg/dL — ABNORMAL HIGH (ref 70–99)
Glucose-Capillary: 255 mg/dL — ABNORMAL HIGH (ref 70–99)
Glucose-Capillary: 179 mg/dL — ABNORMAL HIGH (ref 70–99)

## 2020-12-08 SURGERY — RIGHT/LEFT HEART CATH AND CORONARY/GRAFT ANGIOGRAPHY
Anesthesia: Moderate Sedation

## 2020-12-08 MED ORDER — HYDRALAZINE HCL 20 MG/ML IJ SOLN: 10.0000 mg | INTRAMUSCULAR | Status: AC | PRN

## 2020-12-08 MED ORDER — CLOPIDOGREL BISULFATE 75 MG PO TABS
ORAL_TABLET | ORAL | Status: AC
  Filled 2020-12-08: qty 8

## 2020-12-08 MED ORDER — INSULIN GLARGINE-YFGN 100 UNIT/ML ~~LOC~~ SOLN
41.0000 [IU] | Freq: Every day | SUBCUTANEOUS | Status: DC
  Administered 2020-12-09: 41 [IU] via SUBCUTANEOUS
  Filled 2020-12-08: qty 0.41

## 2020-12-08 MED ORDER — FUROSEMIDE 10 MG/ML IJ SOLN
INTRAMUSCULAR | Status: AC
  Administered 2020-12-08: 40 mg via INTRAVENOUS
  Filled 2020-12-08: qty 4

## 2020-12-08 MED ORDER — GABAPENTIN 100 MG PO CAPS
100.0000 mg | ORAL_CAPSULE | Freq: Three times a day (TID) | ORAL | Status: DC
  Administered 2020-12-08 – 2020-12-09 (×2): 100 mg via ORAL
  Filled 2020-12-08 (×2): qty 1

## 2020-12-08 MED ORDER — FENTANYL CITRATE (PF) 100 MCG/2ML IJ SOLN
INTRAMUSCULAR | Status: DC | PRN
  Administered 2020-12-08: 12.5 ug via INTRAVENOUS

## 2020-12-08 MED ORDER — CLOPIDOGREL BISULFATE 75 MG PO TABS
ORAL_TABLET | ORAL | Status: DC | PRN
  Administered 2020-12-08: 600 mg via ORAL

## 2020-12-08 MED ORDER — SODIUM CHLORIDE 0.9 % IV SOLN: 250.0000 mL | INTRAVENOUS | Status: DC | PRN

## 2020-12-08 MED ORDER — SODIUM CHLORIDE 0.9% FLUSH: 3.0000 mL | Freq: Two times a day (BID) | INTRAVENOUS | Status: DC

## 2020-12-08 MED ORDER — MIDAZOLAM HCL 2 MG/2ML IJ SOLN
INTRAMUSCULAR | Status: DC | PRN
  Administered 2020-12-08: .5 mg via INTRAVENOUS

## 2020-12-08 MED ORDER — LIDOCAINE HCL 1 % IJ SOLN
INTRAMUSCULAR | Status: AC
  Filled 2020-12-08: qty 20

## 2020-12-08 MED ORDER — IOHEXOL 350 MG/ML SOLN
INTRAVENOUS | Status: DC | PRN
  Administered 2020-12-08: 63 mL

## 2020-12-08 MED ORDER — SODIUM CHLORIDE 0.9 % WEIGHT BASED INFUSION: 1.0000 mL/kg/h | INTRAVENOUS | Status: DC

## 2020-12-08 MED ORDER — NITROGLYCERIN 1 MG/10 ML FOR IR/CATH LAB
INTRA_ARTERIAL | Status: AC
  Filled 2020-12-08: qty 10

## 2020-12-08 MED ORDER — SODIUM CHLORIDE 0.9% FLUSH: 3.0000 mL | INTRAVENOUS | Status: DC | PRN

## 2020-12-08 MED ORDER — HEPARIN SODIUM (PORCINE) 1000 UNIT/ML IJ SOLN
INTRAMUSCULAR | Status: DC | PRN
  Administered 2020-12-08: 3000 [IU] via INTRAVENOUS
  Administered 2020-12-08: 5000 [IU] via INTRAVENOUS
  Administered 2020-12-08: 2000 [IU] via INTRAVENOUS
  Administered 2020-12-08: 5000 [IU] via INTRAVENOUS

## 2020-12-08 MED ORDER — ENOXAPARIN SODIUM 40 MG/0.4ML IJ SOSY
40.0000 mg | PREFILLED_SYRINGE | INTRAMUSCULAR | Status: DC
  Administered 2020-12-09: 40 mg via SUBCUTANEOUS
  Filled 2020-12-08: qty 0.4

## 2020-12-08 MED ORDER — HEPARIN (PORCINE) IN NACL 1000-0.9 UT/500ML-% IV SOLN
INTRAVENOUS | Status: AC
  Filled 2020-12-08: qty 1000

## 2020-12-08 MED ORDER — LABETALOL HCL 5 MG/ML IV SOLN: 10.0000 mg | INTRAVENOUS | Status: AC | PRN

## 2020-12-08 MED ORDER — CARVEDILOL 12.5 MG PO TABS
12.5000 mg | ORAL_TABLET | Freq: Two times a day (BID) | ORAL | Status: DC
  Administered 2020-12-08 – 2020-12-09 (×2): 12.5 mg via ORAL
  Filled 2020-12-08 (×2): qty 1

## 2020-12-08 MED ORDER — HEPARIN SODIUM (PORCINE) 1000 UNIT/ML IJ SOLN
INTRAMUSCULAR | Status: AC
  Filled 2020-12-08: qty 1

## 2020-12-08 MED ORDER — SODIUM CHLORIDE 0.9% FLUSH
3.0000 mL | Freq: Two times a day (BID) | INTRAVENOUS | Status: DC
  Administered 2020-12-08 – 2020-12-09 (×2): 3 mL via INTRAVENOUS

## 2020-12-08 MED ORDER — IRBESARTAN 150 MG PO TABS
300.0000 mg | ORAL_TABLET | Freq: Every day | ORAL | Status: DC
  Administered 2020-12-09: 300 mg via ORAL
  Filled 2020-12-08: qty 2

## 2020-12-08 MED ORDER — FINASTERIDE 5 MG PO TABS
5.0000 mg | ORAL_TABLET | Freq: Every evening | ORAL | Status: DC
  Administered 2020-12-08: 5 mg via ORAL
  Filled 2020-12-08: qty 1

## 2020-12-08 MED ORDER — LIDOCAINE HCL (PF) 1 % IJ SOLN
INTRAMUSCULAR | Status: DC | PRN
  Administered 2020-12-08: 4 mL

## 2020-12-08 MED ORDER — ASPIRIN 81 MG PO CHEW: 81.0000 mg | CHEWABLE_TABLET | ORAL | Status: DC

## 2020-12-08 MED ORDER — DOXAZOSIN MESYLATE 2 MG PO TABS
2.0000 mg | ORAL_TABLET | Freq: Two times a day (BID) | ORAL | Status: DC
  Administered 2020-12-08 – 2020-12-09 (×2): 2 mg via ORAL
  Filled 2020-12-08 (×4): qty 1

## 2020-12-08 MED ORDER — ONDANSETRON HCL 4 MG/2ML IJ SOLN: 4.0000 mg | Freq: Four times a day (QID) | INTRAMUSCULAR | Status: DC | PRN

## 2020-12-08 MED ORDER — SODIUM CHLORIDE 0.9 % WEIGHT BASED INFUSION: 3.0000 mL/kg/h | INTRAVENOUS | Status: DC

## 2020-12-08 MED ORDER — INSULIN GLARGINE (1 UNIT DIAL) 300 UNIT/ML ~~LOC~~ SOPN: 41.0000 [IU] | PEN_INJECTOR | Freq: Every morning | SUBCUTANEOUS | Status: DC

## 2020-12-08 MED ORDER — VERAPAMIL HCL 2.5 MG/ML IV SOLN
INTRAVENOUS | Status: DC | PRN
  Administered 2020-12-08: 2.5 mg via INTRA_ARTERIAL

## 2020-12-08 MED ORDER — VERAPAMIL HCL 2.5 MG/ML IV SOLN
INTRAVENOUS | Status: AC
  Filled 2020-12-08: qty 2

## 2020-12-08 MED ORDER — ACETAMINOPHEN 325 MG PO TABS: 650.0000 mg | ORAL_TABLET | ORAL | Status: DC | PRN

## 2020-12-08 MED ORDER — ASPIRIN EC 81 MG PO TBEC
81.0000 mg | DELAYED_RELEASE_TABLET | ORAL | Status: DC
  Administered 2020-12-09: 81 mg via ORAL
  Filled 2020-12-08: qty 1

## 2020-12-08 MED ORDER — SODIUM CHLORIDE 0.9 % IV SOLN: INTRAVENOUS | Status: DC

## 2020-12-08 MED ORDER — ISOSORBIDE MONONITRATE ER 30 MG PO TB24
30.0000 mg | ORAL_TABLET | Freq: Every day | ORAL | Status: DC
  Administered 2020-12-08 – 2020-12-09 (×2): 30 mg via ORAL
  Filled 2020-12-08 (×2): qty 1

## 2020-12-08 MED ORDER — FENTANYL CITRATE (PF) 100 MCG/2ML IJ SOLN
INTRAMUSCULAR | Status: AC
  Filled 2020-12-08: qty 2

## 2020-12-08 MED ORDER — PANTOPRAZOLE SODIUM 40 MG PO TBEC
40.0000 mg | DELAYED_RELEASE_TABLET | Freq: Every day | ORAL | Status: DC
  Administered 2020-12-09: 40 mg via ORAL
  Filled 2020-12-08: qty 1

## 2020-12-08 MED ORDER — FUROSEMIDE 10 MG/ML IJ SOLN: 40.0000 mg | Freq: Every day | INTRAMUSCULAR | Status: DC

## 2020-12-08 MED ORDER — MIDAZOLAM HCL 2 MG/2ML IJ SOLN
INTRAMUSCULAR | Status: AC
  Filled 2020-12-08: qty 2

## 2020-12-08 MED ORDER — INSULIN ASPART 100 UNIT/ML IJ SOLN
0.0000 [IU] | Freq: Three times a day (TID) | INTRAMUSCULAR | Status: DC
Start: 2020-12-08 — End: 2020-12-09
  Administered 2020-12-08: 8 [IU] via SUBCUTANEOUS
  Filled 2020-12-08: qty 1

## 2020-12-08 MED ORDER — HEPARIN (PORCINE) IN NACL 1000-0.9 UT/500ML-% IV SOLN
INTRAVENOUS | Status: DC | PRN
  Administered 2020-12-08: 1000 mL
  Administered 2020-12-08: 500 mL

## 2020-12-08 SURGICAL SUPPLY — 21 items
BALLN TREK RX 2.25X12 (BALLOONS) ×3
BALLOON TREK RX 2.25X12 (BALLOONS) ×2 IMPLANT
CATH BALLN WEDGE 5F 110CM (CATHETERS) ×3 IMPLANT
CATH INFINITI 5 FR IM (CATHETERS) ×3 IMPLANT
CATH INFINITI 5 FR JL3.5 (CATHETERS) ×3 IMPLANT
CATH INFINITI 5 FR MPA2 (CATHETERS) ×3 IMPLANT
CATH VISTA GUIDE 6FR MPA1 (CATHETERS) ×3 IMPLANT
DEVICE RAD COMP TR BAND LRG (VASCULAR PRODUCTS) ×3 IMPLANT
DRAPE BRACHIAL (DRAPES) ×6 IMPLANT
GLIDESHEATH SLEND A-KIT 6F 22G (SHEATH) ×3 IMPLANT
GLIDESHEATH SLEND SS 6F .021 (SHEATH) ×3 IMPLANT
GUIDEWIRE INQWIRE 1.5J.035X260 (WIRE) ×2 IMPLANT
INQWIRE 1.5J .035X260CM (WIRE) ×3
KIT ENCORE 26 ADVANTAGE (KITS) ×3 IMPLANT
PACK CARDIAC CATH (CUSTOM PROCEDURE TRAY) ×3 IMPLANT
PROTECTION STATION PRESSURIZED (MISCELLANEOUS) ×3
SET ATX SIMPLICITY (MISCELLANEOUS) ×3 IMPLANT
STATION PROTECTION PRESSURIZED (MISCELLANEOUS) ×2 IMPLANT
WIRE ASAHI FIELDER XT 300CM (WIRE) ×3 IMPLANT
WIRE HI TORQ WHISPER MS 190CM (WIRE) ×3 IMPLANT
WIRE RUNTHROUGH .014X180CM (WIRE) ×3 IMPLANT

## 2020-12-08 NOTE — Plan of Care (Signed)
  Problem: Education: Goal: Knowledge of General Education information will improve Description: Including pain rating scale, medication(s)/side effects and non-pharmacologic comfort measures 12/08/2020 1835 by Cristela Blue, RN Outcome: Progressing 12/08/2020 [place] by Cristela Blue, RN Outcome: Progressing   Problem: Health Behavior/Discharge Planning: Goal: Ability to manage health-related needs will improve 12/08/2020 1835 by Cristela Blue, RN Outcome: Progressing 12/08/2020 [place] by Cristela Blue, RN Outcome: Progressing   Problem: Clinical Measurements: Goal: Ability to maintain clinical measurements within normal limits will improve 12/08/2020 1835 by Cristela Blue, RN Outcome: Progressing 12/08/2020 [place] by Cristela Blue, RN Outcome: Progressing Goal: Will remain free from infection 12/08/2020 1835 by Cristela Blue, RN Outcome: Progressing 12/08/2020 [place] by Cristela Blue, RN Outcome: Progressing Goal: Diagnostic test results will improve 12/08/2020 1835 by Cristela Blue, RN Outcome: Progressing 12/08/2020 [place] by Cristela Blue, RN Outcome: Progressing Goal: Respiratory complications will improve 12/08/2020 1835 by Cristela Blue, RN Outcome: Progressing 12/08/2020 [place] by Cristela Blue, RN Outcome: Progressing Goal: Cardiovascular complication will be avoided 12/08/2020 1835 by Cristela Blue, RN Outcome: Progressing 12/08/2020 [place] by Cristela Blue, RN Outcome: Progressing   Problem: Activity: Goal: Risk for activity intolerance will decrease 12/08/2020 1835 by Cristela Blue, RN Outcome: Progressing 12/08/2020 [place] by Cristela Blue, RN Outcome: Progressing   Problem: Nutrition: Goal: Adequate nutrition will be maintained 12/08/2020 1835 by Cristela Blue, RN Outcome: Progressing 12/08/2020 [place] by Cristela Blue, RN Outcome: Progressing   Problem: Coping: Goal: Level of anxiety will decrease 12/08/2020 1835 by Cristela Blue, RN Outcome:  Progressing 12/08/2020 [place] by Cristela Blue, RN Outcome: Progressing   Problem: Elimination: Goal: Will not experience complications related to bowel motility 12/08/2020 1835 by Cristela Blue, RN Outcome: Progressing 12/08/2020 [place] by Cristela Blue, RN Outcome: Progressing Goal: Will not experience complications related to urinary retention 12/08/2020 1835 by Cristela Blue, RN Outcome: Progressing 12/08/2020 [place] by Cristela Blue, RN Outcome: Progressing   Problem: Pain Managment: Goal: General experience of comfort will improve 12/08/2020 1835 by Cristela Blue, RN Outcome: Progressing 12/08/2020 [place] by Cristela Blue, RN Outcome: Progressing   Problem: Safety: Goal: Ability to remain free from injury will improve 12/08/2020 1835 by Cristela Blue, RN Outcome: Progressing 12/08/2020 [place] by Cristela Blue, RN Outcome: Progressing   Problem: Skin Integrity: Goal: Risk for impaired skin integrity will decrease 12/08/2020 1835 by Cristela Blue, RN Outcome: Progressing 12/08/2020 [place] by Cristela Blue, RN Outcome: Progressing

## 2020-12-08 NOTE — Interval H&P Note (Signed)
History and Physical Interval Note:  12/08/2020 12:36 PM  [name]  has presented today for surgery, with the diagnosis of shortness of breath.  The various methods of treatment have been discussed with the patient and family. After consideration of risks, benefits and other options for treatment, the patient has consented to  Procedure(s): RIGHT/LEFT HEART CATH AND CORONARY/GRAFT ANGIOGRAPHY (Bilateral) as a surgical intervention.  The patient's history has been reviewed, patient examined, no change in status, stable for surgery.  I have reviewed the patient's chart and labs.  Questions were answered to the patient's satisfaction.    Cath Lab Visit (complete for each Cath Lab visit)  Clinical Evaluation Leading to the Procedure:   ACS: No.  Non-ACS:    Anginal Classification: CCS IV  Anti-ischemic medical therapy: Minimal Therapy (1 class of medications)  Non-Invasive Test Results: No non-invasive testing performed  Prior CABG: Previous CABG  Kourtnee Lahey

## 2020-12-09 ENCOUNTER — Encounter: Payer: Self-pay | Admitting: Internal Medicine

## 2020-12-09 ENCOUNTER — Other Ambulatory Visit: Payer: Self-pay | Admitting: Nurse Practitioner

## 2020-12-09 ENCOUNTER — Telehealth: Payer: Self-pay | Admitting: Cardiovascular Disease

## 2020-12-09 ENCOUNTER — Observation Stay (HOSPITAL_BASED_OUTPATIENT_CLINIC_OR_DEPARTMENT_OTHER)
Admission: RE | Admit: 2020-12-09 | Discharge: 2020-12-09 | Disposition: A | Discharge status: Home / Self Care | Payer: Medicare Other | Source: Home / Self Care | Attending: Internal Medicine | Admitting: Internal Medicine

## 2020-12-09 DIAGNOSIS — N183 Chronic kidney disease, stage 3 unspecified: Secondary | ICD-10-CM | POA: Diagnosis not present

## 2020-12-09 DIAGNOSIS — H43391 Other vitreous opacities, right eye: Secondary | ICD-10-CM | POA: Diagnosis not present

## 2020-12-09 DIAGNOSIS — Z951 Presence of aortocoronary bypass graft: Secondary | ICD-10-CM | POA: Diagnosis not present

## 2020-12-09 DIAGNOSIS — E1122 Type 2 diabetes mellitus with diabetic chronic kidney disease: Secondary | ICD-10-CM | POA: Diagnosis not present

## 2020-12-09 DIAGNOSIS — Z20822 Contact with and (suspected) exposure to covid-19: Secondary | ICD-10-CM | POA: Diagnosis not present

## 2020-12-09 DIAGNOSIS — I25728 Atherosclerosis of autologous artery coronary artery bypass graft(s) with other forms of angina pectoris: Secondary | ICD-10-CM | POA: Diagnosis not present

## 2020-12-09 DIAGNOSIS — I130 Hypertensive heart and chronic kidney disease with heart failure and stage 1 through stage 4 chronic kidney disease, or unspecified chronic kidney disease: Secondary | ICD-10-CM | POA: Diagnosis not present

## 2020-12-09 DIAGNOSIS — I25118 Atherosclerotic heart disease of native coronary artery with other forms of angina pectoris: Secondary | ICD-10-CM | POA: Diagnosis not present

## 2020-12-09 DIAGNOSIS — I10 Essential (primary) hypertension: Secondary | ICD-10-CM

## 2020-12-09 DIAGNOSIS — I2582 Chronic total occlusion of coronary artery: Secondary | ICD-10-CM | POA: Diagnosis not present

## 2020-12-09 DIAGNOSIS — I5033 Acute on chronic diastolic (congestive) heart failure: Secondary | ICD-10-CM

## 2020-12-09 DIAGNOSIS — I2510 Atherosclerotic heart disease of native coronary artery without angina pectoris: Secondary | ICD-10-CM

## 2020-12-09 DIAGNOSIS — H353213 Exudative age-related macular degeneration, right eye, with inactive scar: Secondary | ICD-10-CM | POA: Diagnosis not present

## 2020-12-09 DIAGNOSIS — N179 Acute kidney failure, unspecified: Secondary | ICD-10-CM

## 2020-12-09 DIAGNOSIS — I272 Pulmonary hypertension, unspecified: Secondary | ICD-10-CM | POA: Diagnosis not present

## 2020-12-09 DIAGNOSIS — H43821 Vitreomacular adhesion, right eye: Secondary | ICD-10-CM | POA: Diagnosis not present

## 2020-12-09 DIAGNOSIS — Z96641 Presence of right artificial hip joint: Secondary | ICD-10-CM | POA: Diagnosis not present

## 2020-12-09 DIAGNOSIS — H43813 Vitreous degeneration, bilateral: Secondary | ICD-10-CM | POA: Diagnosis not present

## 2020-12-09 DIAGNOSIS — I5032 Chronic diastolic (congestive) heart failure: Secondary | ICD-10-CM | POA: Diagnosis not present

## 2020-12-09 DIAGNOSIS — Z85828 Personal history of other malignant neoplasm of skin: Secondary | ICD-10-CM | POA: Diagnosis not present

## 2020-12-09 DIAGNOSIS — Z79899 Other long term (current) drug therapy: Secondary | ICD-10-CM | POA: Diagnosis not present

## 2020-12-09 DIAGNOSIS — Z8616 Personal history of COVID-19: Secondary | ICD-10-CM | POA: Diagnosis not present

## 2020-12-09 LAB — ECHOCARDIOGRAM COMPLETE
AR max vel: 1.75 cm2
AV Area VTI: 1.75 cm2
AV Area mean vel: 1.89 cm2
AV Mean grad: 9 mmHg
AV Peak grad: 19.4 mmHg
Ao pk vel: 2.2 m/s
Area-P 1/2: 3.13 cm2
Calc EF: 45.1 %
Height: 73 in
MV VTI: 2.14 cm2
S' Lateral: 4.1 cm
Single Plane A2C EF: 35.9 %
Single Plane A4C EF: 50.4 %
Weight: 3488 oz

## 2020-12-09 LAB — BASIC METABOLIC PANEL
Anion gap: 7 (ref 5–15)
BUN: 22 mg/dL (ref 8–23)
CO2: 26 mmol/L (ref 22–32)
Calcium: 8.4 mg/dL — ABNORMAL LOW (ref 8.9–10.3)
Chloride: 107 mmol/L (ref 98–111)
Creatinine, Ser: 1.47 mg/dL — ABNORMAL HIGH (ref 0.61–1.24)
GFR, Estimated: 46 mL/min — ABNORMAL LOW (ref >60)
Glucose, Bld: 122 mg/dL — ABNORMAL HIGH (ref 70–99)
Potassium: 3.8 mmol/L (ref 3.5–5.1)
Sodium: 140 mmol/L (ref 135–145)

## 2020-12-09 LAB — CBC
HCT: 32.7 % — ABNORMAL LOW (ref 39.0–52.0)
Hemoglobin: 11.5 g/dL — ABNORMAL LOW (ref 13.0–17.0)
MCH: 33.1 pg (ref 26.0–34.0)
MCHC: 35.2 g/dL (ref 30.0–36.0)
MCV: 94.2 fL (ref 80.0–100.0)
Platelets: 104 10*3/uL — ABNORMAL LOW (ref 150–400)
RBC: 3.47 MIL/uL — ABNORMAL LOW (ref 4.22–5.81)
RDW: 13 % (ref 11.5–15.5)
WBC: 7 10*3/uL (ref 4.0–10.5)
nRBC: 0 % (ref 0.0–0.2)

## 2020-12-09 LAB — GLUCOSE, CAPILLARY: Glucose-Capillary: 120 mg/dL — ABNORMAL HIGH (ref 70–99)

## 2020-12-09 MED ORDER — PERFLUTREN LIPID MICROSPHERE
1.0000 mL | INTRAVENOUS | Status: AC | PRN
  Administered 2020-12-09: 2 mL via INTRAVENOUS
  Filled 2020-12-09: qty 10

## 2020-12-09 MED ORDER — ISOSORBIDE MONONITRATE ER 30 MG PO TB24: 30.0000 mg | ORAL_TABLET | Freq: Every day | ORAL | 6 refills | Status: DC

## 2020-12-09 NOTE — Progress Notes (Signed)
This RN provided discharge instructions and teaching to the patient. The patient verbalized and demonstrated understanding of the provided instructions. All outstanding questions resolved. R arm PIV removed. Cannula intact. Pt tolerated well. All belongings packed and in tow. Volunteer services to transport patient to private vehicle via wheelchair at time of departure.

## 2020-12-09 NOTE — Progress Notes (Signed)
Progress Note  Patient Name: [name] [name] Date of Encounter: 12/09/2020  [name] Cardiologist: Dr. Rockey Situ  Subjective   Denies chest pain.  Moderate improvement in shortness of breath.  Was taken off statins after having abnormal LFTs and high bilirubin 6 months ago.  Total and LDL cholesterol have been well controlled.  Wife at bedside  Inpatient Medications    Scheduled Meds:  aspirin EC  81 mg Oral QODAY   carvedilol  12.5 mg Oral BID WC   doxazosin  2 mg Oral BID   enoxaparin (LOVENOX) injection  40 mg Subcutaneous Q24H   finasteride  5 mg Oral QPM   gabapentin  100 mg Oral TID   insulin aspart  0-15 Units Subcutaneous TID WC   insulin glargine-yfgn  41 Units Subcutaneous Daily   irbesartan  300 mg Oral Daily   isosorbide mononitrate  30 mg Oral Daily   pantoprazole  40 mg Oral Daily   sodium chloride flush  3 mL Intravenous Q12H   Continuous Infusions:  sodium chloride     PRN Meds: sodium chloride, acetaminophen, ondansetron (ZOFRAN) IV, sodium chloride flush   Vital Signs    Vitals:   12/08/20 1800 12/08/20 1957 12/09/20 0117 12/09/20 0738  BP: (!) 150/63 (!) 144/68 (!) 128/58 127/61  Pulse: 64 61 63 68  Resp: 19 18 18 18   Temp: 97.8 F (36.6 C) 98.5 F (36.9 C) 98.6 F (37 C) 97.9 F (36.6 C)  TempSrc: Oral  Oral Oral  SpO2: 97% 94% 94% 95%  Weight: 98.9 kg     Height: 6\' 1"  (1.854 m)       Intake/Output Summary (Last 24 hours) at 12/09/2020 1214 Last data filed at 12/09/2020 1040 Gross per 24 hour  Intake 775 ml  Output 1201 ml  Net -426 ml   Last 3 Weights 12/08/2020 12/08/2020 12/04/2020  Weight (lbs) 218 lb 218 lb 226 lb 2 oz  Weight (kg) 98.884 kg 98.884 kg 102.57 kg      Telemetry    Sinus rhythm, PACs- Personally Reviewed  ECG     - Personally Reviewed  Physical Exam   GEN: No acute distress.   Neck: No JVD Cardiac: RRR, no murmurs, rubs, or gallops.  Respiratory: Clear to auscultation bilaterally. GI: Soft,  nontender, non-distended  MS: No edema; No deformity. Neuro:  Nonfocal  Psych: Normal affect   Labs    High Sensitivity Troponin:  No results for input(s): TROPONINIHS in the last 720 hours.   Chemistry Recent Labs  Lab 12/04/20 1243 12/09/20 0550  NA 139 140  K 4.3 3.8  CL 106 107  CO2 18* 26  GLUCOSE 264* 122*  BUN 17 22  CREATININE 1.26 1.47*  CALCIUM 8.3* 8.4*  GFRNONAA  --  46*  ANIONGAP  --  7    Lipids No results for input(s): CHOL, TRIG, HDL, LABVLDL, LDLCALC, CHOLHDL in the last 168 hours.  Hematology Recent Labs  Lab 12/03/20 1414 12/09/20 0550  WBC 5.5 7.0  RBC 3.82* 3.47*  HGB 12.1* 11.5*  HCT 36.3* 32.7*  MCV 95 94.2  MCH 31.7 33.1  MCHC 33.3 35.2  RDW 12.5 13.0  PLT 95* 104*   Thyroid No results for input(s): TSH, FREET4 in the last 168 hours.  BNPNo results for input(s): BNP, PROBNP in the last 168 hours.  DDimer No results for input(s): DDIMER in the last 168 hours.   Radiology    CARDIAC CATHETERIZATION  Result Date: 12/08/2020  Conclusions: Severe native coronary artery disease with chronic total occlusions of the ostial LAD and mid RCA.  There is also moderate to severe diffuse disease involving the mid/distal LAD beyond the LIMA anastomosis as well as the ostial through mid LCx. Widely patent LIMA to LAD graft. Severely diseased SVG to PDA with sequential 99% and 50% mid graft stenoses with TIMI I flow. Chronically occluded SVG to D and SVG to OM. Unsuccessful PCI to SVG to PDA due to inability to cross the lesion with multiple wires ([employer], [employer], and [employer]) including with balloon support.  At the end of the aborted procedure, the proximal SVG to PDA is occluded with TIMI-0 flow (TIMI-1 flow prior to intervention). Recommendations: Escalate medical therapy.  I will hold hydralazine to allow for blood pressure room and add isosorbide mononitrate 30 mg daily. Start diuresis with furosemide 40 mg IV daily; dose escalation may be needed  based on urine output and renal function. Continue indefinite aspirin 81 mg daily.  Defer adding P2Y12 inhibitor in the setting of chronic anemia and thrombocytopenia. Aggressive secondary prevention. Obtain echocardiogram. Overnight observation. Outpatient cardiac rehab. Nelva Bush, MD [name]   Cardiac Studies   [facility] 12/08/2020 Conclusions: Severe native coronary artery disease with chronic total occlusions of the ostial LAD and mid RCA.  There is also moderate to severe diffuse disease involving the mid/distal LAD beyond the LIMA anastomosis as well as the ostial through mid LCx. Widely patent LIMA to LAD graft. Severely diseased SVG to PDA with sequential 99% and 50% mid graft stenoses with TIMI I flow. Chronically occluded SVG to D and SVG to OM. Unsuccessful PCI to SVG to PDA due to inability to cross the lesion with multiple wires ([employer], [employer], and [employer]) including with balloon support.  At the end of the aborted procedure, the proximal SVG to PDA is occluded with TIMI-0 flow (TIMI-1 flow prior to intervention).   TTE 12/09/2020 1. Left ventricular ejection fraction, by estimation, is 55%. The left  ventricle has normal function. The left ventricle has no regional wall  motion abnormalities. There is mild left ventricular hypertrophy. Left  ventricular diastolic parameters are  indeterminate.   2. Right ventricular systolic function is normal. The right ventricular  size is normal.   3. The mitral valve is normal in structure. Mild mitral valve  regurgitation.   4. The aortic valve is tricuspid. Aortic valve regurgitation is mild.  Mild to moderate aortic valve sclerosis/calcification is present, without  any evidence of aortic stenosis.   5. The inferior vena cava is dilated in size with <50% respiratory  variability, suggesting right atrial pressure of 15 mmHg.   Patient Profile     87 y.o. male CAD/CABG x4, hypertension who presents with shortness of  breath and elective left heart cath  Assessment & Plan    CAD/CABG x4 -Patent LIMA to LAD, other vein grafts occluded. -Medical management advised -Imdur was started, hydralazine stopped. -Continue aspirin, Coreg -LDL at goal, not on statin due to prior liver issues.  Follow-up with primary cardiologist regarding need for reinitiating statin. -Echo showed preserved EF  2.  Hypertension -BP controlled  Patient currently discharged from a cardiac perspective on current medications.  Continue Lasix 20 mg daily, check BMP in 5 days.  Close follow-up as outpatient.  Total encounter time 35 minutes  Greater than 50% was spent in counseling and coordination of care with the patient      Signed, Kate Sable, MD  12/09/2020, 12:14 PM

## 2020-12-09 NOTE — Telephone Encounter (Signed)
Was able to reach out to pt regarding his finasteride (PROSCAR), advised to reach out to PCP as this medication is not cardiac, it is for urinary retention prostate issues. Advised Dr. [name] filled back in Jan.   On current medication list, was also on list at last [place] in September with Dr. Rockey Situ, unsure if pt should be taking or not. Advised again to call PCP for clarification. Pt verbalized understanding and is thankful for the return call.

## 2020-12-09 NOTE — Progress Notes (Signed)
Bmet 

## 2020-12-09 NOTE — Telephone Encounter (Signed)
Patient states hospital took him off of Finasteride 5mg  back in April concerned if he should continue taking medication

## 2020-12-09 NOTE — Progress Notes (Signed)
*  PRELIMINARY RESULTS* Echocardiogram 2D Echocardiogram has been performed.  [name] [name] 12/09/2020, 9:40 AM

## 2020-12-09 NOTE — Discharge Summary (Addendum)
Discharge Summary    Patient ID: CARLSON BELLAND MRN: [id number]; DOB: 08/04/1933  Admit date: 12/08/2020 Discharge date: 12/09/2020  Primary Care Provider: [name], MD  Primary Cardiologist: Ida Rogue, MD  Primary Electrophysiologist:  None   Discharge Diagnoses    Principal Problem:   Unstable angina [facility]) Active Problems:   CAD (coronary artery disease)   Acute on chronic heart failure with preserved ejection fraction (HFpEF) ([place])   Type 2 diabetes mellitus with complication, without long-term current use of insulin (HCC)   CKD (chronic kidney disease) stage 3, GFR 30-59 ml/min (HCC)   AKI (acute kidney injury) ([place])   Pulmonary hypertension, unspecified ([place])   Hyperlipidemia   Essential hypertension   BPH (benign prostatic hyperplasia)   Diagnostic Studies/Procedures    Cardiac Catheterization  10.4.2022  Left Anterior Descending  Ost LAD to Prox LAD lesion is 100% stenosed. The lesion is chronically occluded.  Mid LAD to Dist LAD lesion is 70% stenosed.  Left Circumflex  Ost Cx to Prox Cx lesion is 60% stenosed.  Right Coronary Artery  Prox RCA to Dist RCA lesion is 100% stenosed. The lesion is chronically occluded.  Graft To Dist Cx  Origin lesion is 100% stenosed. The lesion is chronically occluded.  Graft To 2nd Diag  Origin lesion is 100% stenosed.  LIMA Graft To Mid LAD  Graft To RPAV  Mid Graft-1 lesion is 99% stenosed. The lesion is calcified.      **Unsuccessful attempt @ PCI of the VG  RPAV  Mid Graft-2 lesion is 50% stenosed.   Right Heart Pressures RA (mean): 14 mmHg RV (S/EDP): 55/13 mmHg PA (S/D, mean): 53/25 mmHg PCWP (mean): 30 mmHg  _____________  2D Echocardiogram 10.5.2022  *Result pending @ the time of discharge*   History of Present Illness     [name] [name] is a 87 y.o. male with a h/o CAD s/p CABG x 4 in 2009,  HFpEF, HTN, HL, PAH, DMII, CKD III, and BPH.  He underwent CABG x4 in 2009 with subsequent  catheterizations in April 2015 (patent grafts), July 2017 (3 of 4 patent grafts), and November 2019 (2004 patent grafts).  Echocardiogram in November 2019 showed EF 55 to 60% with grade 2 diastolic dysfunction and mildly increased PASP.  He was hospitalized in February 2022 with acute kidney injury and elevated LFTs prompting discontinuation of diuretic, and statin therapy.  He was recently seen in the office with complaints of worsening dyspnea similar to what he experienced prior to his CABG in 2006.  Out of concern for recurrent ischemia and what appeared to be an anginal equivalent, decision was made to pursue diagnostic catheterization.    Hospital Course     Consultants: None  Patient presented to the [facility] cardiac catheterization laboratory on December 08, 2020 and underwent right and left heart diagnostic catheterization.  Left heart catheterization revealed a patent LIMA to the LAD with severe multivessel native coronary disease.  Vein graft to the diagonal and circumflex were noted to be occluded.  The vein graft to the RPAV/RPDA had a 99% stenosis.  Intervention was attempted however, the interventional team was unable to cross the lesion despite multiple wiring attempts, including with balloon support.  The procedure was aborted and the vein graft to the RPA V/PDA was noted to be occluded.  Right heart catheterization was notable for elevated right heart pressures including a PA pressure 44/15 (27), and a pulmonary capillary wedge pressure of 21.  Post procedure, he was placed on Lasix 40 mg IV twice daily.  Home dose of hydralazine was discontinued in order to make blood pressure room for isosorbide mononitrate 30 mg daily.  Mr. Ching has no complaints this morning.  He has ambulated without chest pain or dyspnea.  He noted good urine output on intravenous Lasix, though I's and O's are inaccurate this morning.  Creatinine did rise from 1.26 preprocedure to 1.47 this  morning.  We held his intravenous Lasix this morning and I prescribed Lasix 40 mg daily to begin tomorrow.  We have arranged for an early basic metabolic panel in our office next Monday and clinic follow-up the following week.  Patient will be discharged home today in good condition.   Did the patient have an acute coronary syndrome (MI, NSTEMI, STEMI, etc) this admission?:  No                               Did the patient have a percutaneous coronary intervention (stent / angioplasty)?:  No.   _____________  Physical Exam   Discharge Vitals Blood pressure 127/61, pulse 68, temperature 97.9 F (36.6 C), temperature source Oral, resp. rate 18, height 6\' 1"  (1.854 m), weight 98.9 kg, SpO2 95 %.  Filed Weights   12/08/20 1214 12/08/20 1800  Weight: 98.9 kg 98.9 kg    GEN: Well nourished, well developed, in no acute distress.  HEENT: Grossly normal.  Neck: Supple, mildly elevated JVP, no carotid bruits, or masses. Cardiac: RRR, 2/6 syst murmur throughout, no rubs, or gallops. No clubbing, cyanosis, edema.  Radials/DP/PT 2+ and equal bilaterally. R radial cath site ecchymotic w/o bleeding/bruit/hematoma.  R brachial cath site w/o bleeding/bruit/hematoma. Respiratory:  Respirations regular and unlabored, clear to auscultation bilaterally. GI: semi-firm, nontender, BS + x 4. MS: no deformity or atrophy. Skin: warm and dry, no rash. Neuro:  Strength and sensation are intact. Psych: AAOx3.  Normal affect.  Labs & Radiologic Studies    CBC Recent Labs    12/09/20 0550  WBC 7.0  HGB 11.5*  HCT 32.7*  MCV 94.2  PLT 789*   Basic Metabolic Panel Recent Labs    12/09/20 0550  NA 140  K 3.8  CL 107  CO2 26  GLUCOSE 122*  BUN 22  CREATININE 1.47*  CALCIUM 8.4*   _____________   Disposition   Pt is being discharged home today in good condition.  Follow-up Plans & Appointments     Follow-up Information     Minna Merritts, MD Follow up on 12/22/2020.   Specialty:  Cardiology Contact information: [place] [address] [phone]         [place] Follow up on 12/14/2020.   Specialty: Cardiology Why: 2:30 PM - Lab follow-up only Contact information: [address], [place] [phone]               Discharge Instructions     ([place]) Call MD:  Anytime you have any of the following symptoms: 1) 3 pound weight gain in 24 hours or 5 pounds in 1 week 2) shortness of breath, with or without a dry hacking cough 3) swelling in the hands, feet or stomach 4) if you have to sleep on extra pillows at night in order to breathe.   Complete by: As directed    AMB Referral to Cardiac Rehabilitation -  Phase II   Complete by: As directed    Unstable angina; unable to intervene on heavily disease bypass graft   Diagnosis: Other   After initial evaluation and assessments completed: Virtual Based Care may be provided alone or in conjunction with Phase 2 Cardiac Rehab based on patient barriers.: Yes   Call MD for:  difficulty breathing, headache or visual disturbances   Complete by: As directed    Call MD for:  redness, tenderness, or signs of infection (pain, swelling, redness, odor or green/yellow discharge around incision site)   Complete by: As directed    Call MD for:  severe uncontrolled pain   Complete by: As directed    Call MD for:  temperature >100.4   Complete by: As directed    Diet - low sodium heart healthy   Complete by: As directed    Diet Carb Modified   Complete by: As directed    Increase activity slowly   Complete by: As directed        Discharge Medications   Allergies as of 12/09/2020       Reactions   Cortisone Other (See Comments)   Leg Cramps   Metformin Diarrhea        Medication List     STOP taking these medications    hydrALAZINE 25 MG tablet Commonly known as: APRESOLINE       TAKE these  medications    aspirin EC 81 MG tablet Take 81 mg by mouth every other day. In the morning   BD Pen Needle Nano U/F 32G X 4 MM Misc Generic drug: Insulin Pen Needle USE AS DIRECTED   carvedilol 12.5 MG tablet Commonly known as: COREG TAKE ONE (1) TABLET BY MOUTH TWO TIMES PER DAY   doxazosin 2 MG tablet Commonly known as: Cardura Take 1 tablet (2 mg total) by mouth 2 (two) times daily.   finasteride 5 MG tablet Commonly known as: PROSCAR Take 1 tablet (5 mg total) by mouth daily. What changed: when to take this   gabapentin 100 MG capsule Commonly known as: NEURONTIN TAKE 1 CAPSULE BY MOUTH 3 TIMES DAILY   HYDROcodone-acetaminophen 5-325 MG tablet Commonly known as: Norco Take 1 tablet by mouth 3 (three) times daily as needed for moderate pain. What changed:  when to take this reasons to take this   isosorbide mononitrate 30 MG 24 hr tablet Commonly known as: IMDUR Take 1 tablet (30 mg total) by mouth daily. Start taking on: December 10, 2020   loperamide 2 MG tablet Commonly known as: Imodium A-D Take 1 tablet (2 mg total) by mouth 2 (two) times daily as needed for up to 60 doses for diarrhea or loose stools. What changed: when to take this   omeprazole 20 MG capsule Commonly known as: PRILOSEC TAKE 1 CAPSULE BY MOUTH TWICE DAILY   PRESERVISION AREDS 2 PO Take 1 tablet by mouth in the morning and at bedtime.   PROBIOTIC-10 PO Take 1 capsule by mouth in the morning.   Toujeo SoloStar 300 UNIT/ML Solostar Pen Generic drug: insulin glargine (1 Unit Dial) INJECT 50 UNITS DAILY MAY INCREASE 2U EVERY 3 DAYS UNTIL FBG IS AT GOAL UP TO 80 UNITS DAILY What changed: See the new instructions.   valsartan 320 MG tablet Commonly known as: DIOVAN Take 1 tablet (320 mg total) by mouth daily.   Vitamin D3 50 MCG (2000 UT) Tabs Take 2,000 Units by mouth in the morning.  Outstanding Labs/Studies   BMET on 10/10  Duration of Discharge Encounter   Greater  than 30 minutes including physician time.  Signed, [name], NP 12/09/2020, 12:24 PM

## 2020-12-14 ENCOUNTER — Other Ambulatory Visit: Payer: Medicare Other

## 2020-12-14 NOTE — Telephone Encounter (Signed)
Patient calling to clarify medication

## 2020-12-14 NOTE — Telephone Encounter (Signed)
Reach out to pt regarding his medication for lasix and potassium. Pt reports was confused as it was given in hospital, but not on his current med list.  Had recent heart cath, only meds discuss   START taking: isosorbide mononitrate (IMDUR) Start taking on: December 10, 2020 STOP taking: hydrALAZINE 25 MG tablet (APRESOLINE)  Noted med list then lasix and potassium not on list, advised some meds may be given in hospital part of treatment plan, but not sent home with pt. Also was not on past OV with Dr. Rockey Situ on 9/30. Would continue current medication on list, if problem with SOB or swelling, call for an appt and meds can be discuss at that time.  [name] thankful for returning his call and explaining. Will call back with any further concerns.

## 2020-12-16 ENCOUNTER — Other Ambulatory Visit: Payer: Self-pay | Admitting: *Deleted

## 2020-12-16 DIAGNOSIS — I5032 Chronic diastolic (congestive) heart failure: Secondary | ICD-10-CM

## 2020-12-22 ENCOUNTER — Encounter: Payer: Self-pay | Admitting: Cardiovascular Disease

## 2020-12-22 ENCOUNTER — Ambulatory Visit: Payer: Medicare Other | Admitting: Cardiovascular Disease

## 2020-12-22 ENCOUNTER — Other Ambulatory Visit: Payer: Self-pay

## 2020-12-22 VITALS — BP 150/56 | HR 61 | Ht 73.0 in | Wt 226.2 lb

## 2020-12-22 DIAGNOSIS — I25118 Atherosclerotic heart disease of native coronary artery with other forms of angina pectoris: Secondary | ICD-10-CM

## 2020-12-22 DIAGNOSIS — E118 Type 2 diabetes mellitus with unspecified complications: Secondary | ICD-10-CM | POA: Diagnosis not present

## 2020-12-22 DIAGNOSIS — I2721 Secondary pulmonary arterial hypertension: Secondary | ICD-10-CM | POA: Diagnosis not present

## 2020-12-22 DIAGNOSIS — I10 Essential (primary) hypertension: Secondary | ICD-10-CM | POA: Diagnosis not present

## 2020-12-22 DIAGNOSIS — I5032 Chronic diastolic (congestive) heart failure: Secondary | ICD-10-CM | POA: Diagnosis not present

## 2020-12-22 DIAGNOSIS — Z951 Presence of aortocoronary bypass graft: Secondary | ICD-10-CM

## 2020-12-22 DIAGNOSIS — N183 Chronic kidney disease, stage 3 unspecified: Secondary | ICD-10-CM | POA: Diagnosis not present

## 2020-12-22 DIAGNOSIS — E785 Hyperlipidemia, unspecified: Secondary | ICD-10-CM | POA: Diagnosis not present

## 2020-12-22 DIAGNOSIS — Z79899 Other long term (current) drug therapy: Secondary | ICD-10-CM

## 2020-12-22 MED ORDER — TORSEMIDE 20 MG PO TABS: ORAL_TABLET | ORAL | 3 refills | Status: DC

## 2020-12-22 MED ORDER — POTASSIUM CHLORIDE ER 10 MEQ PO TBCR: 10.0000 meq | EXTENDED_RELEASE_TABLET | Freq: Every day | ORAL | 3 refills | Status: DC | PRN

## 2020-12-22 NOTE — Progress Notes (Signed)
I connected with  [name] [name] on 12/23/20   Evaluation Performed:  Follow-up visit  Date:  12/23/2020   ID:  [name] [name], DOB 1934-01-05, MRN [id number]  Patient Location:  [address] [id number]   Provider location:   [facility], [place] office  PCP:  [name], MD  Cardiologist:  Arvid Right [facility]   Chief Complaint  Patient presents with   2-3 week follow up     Patient c/o shortness of breath, abdominal swelling and LE edema. Medications reviewed by the patient verbally.     History of Present Illness:    [name] [name] is a 87 y.o. male  past medical history of coronary artery disease, bypass surgery in November 2006, Stress test March 2017, cath 09/2015, medical management Occluded proximal LAD  Critical/severe native RCA disease  Vein graft to the diagonal is occluded  Other vein grafts patent to the RCA/PDA, vein graft to the OM 3, LIMA graft  EF 55% in 12/2013 diabetes, numbers running high obesity,  hyperlipidemia,  hypertension,  erectile dysfunction Chronic SOB, nonsmoker Postherpetic neuralgia left flank Stable angina, chronic diastolic CHF, chronic shortness of breath Pulmonary hypertension on right heart catheterization who presents for routine followup of his coronary artery disease, and hypertension, chronic diastolic CHF, pulmonary hypertension  Recent presentation to the clinic December 04, 2020, reported worsening shortness of breath, anginal equivalent Was scheduled for Cardiac catheterization December 08, 2020 Severe native coronary artery disease with chronic total occlusions of the ostial LAD and mid RCA.  There is also moderate to severe diffuse disease involving the mid/distal LAD beyond the LIMA anastomosis as well as the ostial through mid LCx. Widely patent LIMA to LAD graft. Severely diseased SVG to PDA with sequential 99% and 50% mid graft stenoses with TIMI I  flow. Chronically occluded SVG to D and SVG to OM. Unsuccessful PCI to SVG to PDA due to inability to cross the lesion with multiple wires ([employer], [employer], and [employer]) including with balloon support.  At the end of the aborted procedure, the proximal SVG to PDA is occluded with TIMI-0 flow (TIMI-1 flow prior to intervention).  Right Heart Pressures RA (mean): 14 mmHg RV (S/EDP): 55/13 mmHg PA (S/D, mean): 53/25 mmHg PCWP (mean): 30 mmHg    Was kept overnight for diuresis, started on isosorbide, hydralazine held Treated overnight in the hospital with IV Lasix Plavix/Brilinta held in the setting of chronic anemia and thrombocytopenia.  Echocardiogram December 09, 2020  1. Left ventricular ejection fraction, by estimation, is 55%. The left  ventricle has normal function. The left ventricle has no regional wall  motion abnormalities. There is mild left ventricular hypertrophy. Left  ventricular diastolic parameters are  indeterminate.   2. Right ventricular systolic function is normal. The right ventricular  size is normal.   3. The mitral valve is normal in structure. Mild mitral valve  regurgitation.   4. The aortic valve is tricuspid. Aortic valve regurgitation is mild.  Mild to moderate aortic valve sclerosis/calcification is present, without  any evidence of aortic stenosis.   5. The inferior vena cava is dilated in size with <50% respiratory  variability, suggesting right atrial pressure of 15 mmHg.  Lab work reviewed creatinine 1.47, high end of his range on December 09, 2020  In follow-up today again reports shortness of breath, cannot do anything, feels like he is up to his neck with fluid Reports  he is not on diuretic, this was held at discharge October 50,022 presumably for bump in his creatinine Abdomen feels tight, has PND orthopnea  EKG personally reviewed by myself on todays visit Normal sinus rhythm rate 61 bpm nonspecific ST abnormality, T wave abnormality  inferior leads concerning for underlying ischemia  Of past medical history reviewed 05/2020: in hospital , [facility] records reviewed  COVID April 2022 treated with paxlovid  Prior echocardiogram in heart catheterization November 2019 Moderate pulmonary hypertension, Severe three-vessel disease Occluded proximal RCA, occluded ostial LAD LIMA graft to the LAD patent, vein graft to the PDA patent The findings this catheterization shows occluded vein graft to the OM 3, now occluded proximal native RCA  In the hospital February 2022 right upper quadrant abdominal pain with associated nausea and vomiting as well as fever with a T-max of one 1.5 for the last 3 to 4 days with associated generalized fatigue and tiredness.   cholestasis with elevated alkaline phosphatase and direct hyperbilirubinemia GGT elevated more than 200 which is consistent with hepatic origin of elevated alkaline phosphatase Acute hepatitis panel negative   Past Medical History:  Diagnosis Date   (HFpEF) heart failure with preserved ejection fraction ([place])    a. 03/6107 Echo: nl systolic fxn, mild LVH, diastolic relaxation abnormality, mildly enlarged LA, mild Ao insufficiency; 01/2018 Echo: EF 55-60%, no rwma, GR2 DD, triv AI, mildly dil LA, mild inc PASP.   BPH (benign prostatic hyperplasia)    Cancer (HCC)    skin   Cataract    Choledocholithiasis    a. 05/2020 ERCP w/ CBD stone removal and biliary sphincterotomy.   Coronary artery disease    a. 01/2005 s/p CABG x 4: LIMA-LAD, VG-Diag, VG-OM3, VG-dRCA; b. 06/2013 Cath: patent grafts; c. 09/2015 Cath: VG->Diag 100, other grafts patent. LAD 100, RCA sev dzs->Med rx; d. 01/2018 Cath: LAD 100ost/p, LCX 50, RCA 100p/d, VG->OM3 100, VG->Diag 100, LIMA->LAD ok, VG->RPAV 45m-->Med rx.   COVID-19    07/2020 had paxlovid    Dyspnea    with exertion   Dysrhythmia    ED (erectile dysfunction)    Elevated LFTs    a. 05/2020 following CBD stone. Liver biopsy consistent w/  changes related to CBD obstruction and not chronic process.   GERD (gastroesophageal reflux disease)    Hyperlipidemia    Hypertension    IDDM (insulin dependent diabetes mellitus) 2000   Osteoarthritis    PAH (pulmonary artery hypertension) ([place])    a. 01/2018 RHC: PA 51/22 (32).   Perirectal fistula    Pneumonia 12/2016   Shingles 09/04/2018   Tubular adenoma of colon 07/2012   Vertigo    Past Surgical History:  Procedure Laterality Date   BACK SURGERY     CARDIAC CATHETERIZATION  06/05/2013   [facility]    CARDIAC CATHETERIZATION N/A 09/24/2015   Procedure: Right Heart Cath and Coronary/Graft Angiography;  Surgeon: Minna Merritts, MD;  Location: [place] [facility];  Service: Cardiovascular;  Laterality: N/A;   CATARACT EXTRACTION  sept 2013   right   CATARACT EXTRACTION W/PHACO Left 03/28/2017   Procedure: CATARACT EXTRACTION PHACO AND INTRAOCULAR LENS PLACEMENT (IOC);  Surgeon: Birder Robson, MD;  Location: [facility] ORS;  Service: Ophthalmology;  Laterality: Left;  [place] 00:42.0 AP% 15.0 CDE 6.31 Fluid Pack lot # [id number]   CHOLECYSTECTOMY  05/15/2011   Procedure: LAPAROSCOPIC CHOLECYSTECTOMY;  Surgeon: Rolm Bookbinder, MD;  Location: [place];  Service: General;  Laterality: N/A;   COLONOSCOPY W/ POLYPECTOMY  CORONARY ARTERY BYPASS GRAFT  2007   x 4   CORONARY STENT INTERVENTION N/A 12/08/2020   Procedure: CORONARY STENT INTERVENTION;  Surgeon: Nelva Bush, MD;  Location: [place] [facility];  Service: Cardiovascular;  Laterality: N/A;   ERCP N/A 05/05/2020   Procedure: ENDOSCOPIC RETROGRADE CHOLANGIOPANCREATOGRAPHY (ERCP);  Surgeon: Lucilla Lame, MD;  Location: [facility] ENDOSCOPY;  Service: Endoscopy;  Laterality: N/A;   ERCP N/A 05/12/2020   Procedure: ENDOSCOPIC RETROGRADE CHOLANGIOPANCREATOGRAPHY (ERCP);  Surgeon: Lucilla Lame, MD;  Location: [facility] ENDOSCOPY;  Service: Endoscopy;  Laterality: N/A;   ESOPHAGOGASTRODUODENOSCOPY N/A 05/12/2020   Procedure:  ESOPHAGOGASTRODUODENOSCOPY (EGD);  Surgeon: Lucilla Lame, MD;  Location: [facility] ENDOSCOPY;  Service: Endoscopy;  Laterality: N/A;   EYE SURGERY     FOOT SURGERY  2012   right foot   JOINT REPLACEMENT  8/10   Right THR--[name]   LEFT AND RIGHT HEART CATHETERIZATION WITH CORONARY/GRAFT ANGIOGRAM N/A 06/05/2013   Procedure: LEFT AND RIGHT HEART CATHETERIZATION WITH Beatrix Fetters;  Surgeon: Peter M Martinique, MD;  Location: [facility] CATH LAB;  Service: Cardiovascular;  Laterality: N/A;   LUMBAR LAMINECTOMY  1989   Prostate photovaporization  5/16   Dr Budd Palmer   RIGHT/LEFT HEART CATH AND CORONARY/GRAFT ANGIOGRAPHY N/A 01/05/2018   Procedure: RIGHT/LEFT HEART CATH AND CORONARY/GRAFT ANGIOGRAPHY;  Surgeon: Minna Merritts, MD;  Location: [place] [facility];  Service: Cardiovascular;  Laterality: N/A;   RIGHT/LEFT HEART CATH AND CORONARY/GRAFT ANGIOGRAPHY Bilateral 12/08/2020   Procedure: RIGHT/LEFT HEART CATH AND CORONARY/GRAFT ANGIOGRAPHY;  Surgeon: Nelva Bush, MD;  Location: [place] [facility];  Service: Cardiovascular;  Laterality: Bilateral;     Current Outpatient Medications on File Prior to Visit  Medication Sig Dispense Refill   aspirin EC 81 MG tablet Take 81 mg by mouth every other day. In the morning     BD PEN NEEDLE NANO U/F 32G X 4 MM MISC USE AS DIRECTED 100 each 0   carvedilol (COREG) 12.5 MG tablet TAKE ONE (1) TABLET BY MOUTH TWO TIMES PER DAY 180 tablet 0   Cholecalciferol (VITAMIN D3) 50 MCG (2000 UT) TABS Take 2,000 Units by mouth in the morning.     doxazosin (CARDURA) 2 MG tablet Take 1 tablet (2 mg total) by mouth 2 (two) times daily. 60 tablet 6   finasteride (PROSCAR) 5 MG tablet Take 1 tablet (5 mg total) by mouth daily. (Patient taking differently: Take 5 mg by mouth every evening.) 90 tablet 1   gabapentin (NEURONTIN) 100 MG capsule TAKE 1 CAPSULE BY MOUTH 3 TIMES DAILY 90 capsule 1   HYDROcodone-acetaminophen (NORCO) 5-325 MG tablet Take 1 tablet by  mouth 3 (three) times daily as needed for moderate pain. (Patient taking differently: Take 1 tablet by mouth daily as needed for moderate pain (severe back pain.).) 90 tablet 0   isosorbide mononitrate (IMDUR) 30 MG 24 hr tablet Take 1 tablet (30 mg total) by mouth daily. 30 tablet 6   loperamide (IMODIUM A-D) 2 MG tablet Take 1 tablet (2 mg total) by mouth 2 (two) times daily as needed for up to 60 doses for diarrhea or loose stools. (Patient taking differently: Take 2 mg by mouth in the morning.) 60 tablet 0   Multiple Vitamins-Minerals (PRESERVISION AREDS 2 PO) Take 1 tablet by mouth in the morning and at bedtime.     omeprazole (PRILOSEC) 20 MG capsule TAKE 1 CAPSULE BY MOUTH TWICE DAILY 60 capsule 1   Probiotic Product (PROBIOTIC-10 PO) Take 1 capsule by mouth in the  morning.     TOUJEO SOLOSTAR 300 UNIT/ML Solostar Pen INJECT 50 UNITS DAILY MAY INCREASE 2U EVERY 3 DAYS UNTIL FBG IS AT GOAL UP TO 80 UNITS DAILY (Patient taking differently: Inject 41 Units into the skin in the morning.) 22.5 mL 6   valsartan (DIOVAN) 320 MG tablet Take 1 tablet (320 mg total) by mouth daily. 90 tablet 3   No current facility-administered medications on file prior to visit.     Allergies:   Cortisone and Metformin   Social History   Tobacco Use   Smoking status: Never   Smokeless tobacco: Never  Vaping Use   Vaping Use: Never used  Substance Use Topics   Alcohol use: Yes    Comment: occ cocktail   Drug use: No     Family Hx: The patient's family history includes Heart disease in his father; Lung cancer in his mother. There is no history of Colon cancer or Breast cancer.  ROS:   Please see the history of present illness.    Review of Systems  Constitutional: Negative.   HENT: Negative.    Respiratory:  Positive for shortness of breath.   Cardiovascular:  Positive for leg swelling.  Gastrointestinal: Negative.   Musculoskeletal: Negative.   Neurological: Negative.   Psychiatric/Behavioral:  Negative.    All other systems reviewed and are negative.   Labs/Other Tests and Data Reviewed:    Recent Labs: 05/15/2020: Magnesium 1.8 11/12/2020: ALT 12 12/09/2020: BUN 22; Creatinine, Ser 1.47; Hemoglobin 11.5; Platelets 104; Potassium 3.8; Sodium 140   Recent Lipid Panel Lab Results  Component Value Date/Time   CHOL 93 03/11/2020 03:25 PM   CHOL 125 12/27/2017 02:46 PM   TRIG 163.0 (H) 03/11/2020 03:25 PM   TRIG 289 (H) 12/27/2017 02:46 PM   HDL 37.60 (L) 03/11/2020 03:25 PM   HDL 39 (L) 07/04/2014 08:01 AM   CHOLHDL 2 03/11/2020 03:25 PM   LDLCALC 23 03/11/2020 03:25 PM   LDLCALC 110 (H) 07/04/2014 08:01 AM   LDLDIRECT 65.0 06/02/2016 02:17 PM    Wt Readings from Last 3 Encounters:  12/22/20 226 lb 4 oz (102.6 kg)  12/08/20 218 lb (98.9 kg)  12/04/20 226 lb 2 oz (102.6 kg)     Exam:    BP (!) 150/56 (BP Location: Left Arm, Patient Position: Sitting, Cuff Size: Normal)   Pulse 61   Ht 6\' 1"  (1.854 m)   Wt 226 lb 4 oz (102.6 kg)   SpO2 98%   BMI 29.85 kg/m   Constitutional:  oriented to person, place, and time. No distress.  HENT:  Head: Grossly normal Eyes:  no discharge. No scleral icterus.  Neck: No JVD, no carotid bruits  Cardiovascular: Regular rate and rhythm, no murmurs appreciated Pulmonary/Chest: Clear to auscultation bilaterally, no wheezes or rails Abdominal: Soft.  no distension.  no tenderness.  Musculoskeletal: Normal range of motion Neurological:  normal muscle tone. Coordination normal. No atrophy Skin: Skin warm and dry Psychiatric: normal affect, pleasant   ASSESSMENT & PLAN:    Coronary artery disease of native artery of native heart with stable angina pectoris (HCC) Severe native coronary disease, 3 of 4 bypass grafts occluded Recently noted occlusion of graft to the RCA, unable to intervene Markedly elevated right heart pressures likely contributing to symptoms Discharged from the hospital postprocedure no diuretic, symptoms  worse -High risk of readmission to the hospital given current symptoms, severe PND orthopnea Currently not on Lasix, we will hold this -Recommend torsemide 20 mg  with potassium 10 for weight between 210 and 215 pounds -Recommend torsemide 20 mg twice a day with potassium 20 for weight over 215 pounds Current weight 221 pounds at home (per Mr. Gundrum), in the office 226  Chronic stable angina, Catheterization details as above Cholesterol at goal  Pulmonary hypertension Previous PFTs, obstructive disease,  Chronic  shortness of breath worse recently Exacerbated by pulmonary hypertension Right heart catheterization with markedly elevated numbers Will need to pursue more aggressive diuretic regiment despite renal dysfunction  Chronic diastolic CHF (congestive heart failure) ([place]) Torsemide regimen as above We will add Farxiga 10 mg daily   Type 2 diabetes mellitus with complication, without long-term current use of insulin (HCC) weight loss, strict low carbohydrate diet  Mixed hyperlipidemia Cholesterol is at goal on the current lipid regimen. No changes to the medications were made.  Essential hypertension Blood pressure elevated, recommend he stop amlodipine given leg swelling Suggest he start Cardura 2 mg twice daily Recommend he call us with blood pressure measurements  S/P CABG x 4 Prior catheterization in the 2019  Catheterization results discussed with him, there is no plan for surgery, no options for stenting  CKD (chronic kidney disease) stage 3, GFR 30-59 ml/min (HCC) Recheck BMP in 2 weeks time on torsemide, Farxiga   Total encounter time more than 35 minutes  Greater than 50% was spent in counseling and coordination of care with the patient    Signed, Ida Rogue, MD  12/23/2020 8:39 AM    [place] [facility] [address], [place], [place] [address]

## 2020-12-22 NOTE — Patient Instructions (Addendum)
Medication Instructions:   Stop lasix   Weight at home 221 For weight 210 to 215 take torsemide 20 mg with potassium 10 meq For weight > 215 please take Torsemide 20 mg twice a day  (8 am and 2 pm) With potassium 20 meq  If you need a refill on your cardiac medications before your next appointment, please call your pharmacy.    Lab work: Bmp in 2 weeks  Testing/Procedures: No new testing needed  Follow-Up: At [facility], you and your health needs are our priority.  As part of our continuing mission to provide you with exceptional heart care, we have created designated Provider Care Teams.  These Care Teams include your primary Cardiologist (physician) and Advanced Practice Providers (APPs -  Physician Assistants and Nurse Practitioners) who all work together to provide you with the care you need, when you need it.  You will need a follow up appointment in 1 month  Providers on your designated Care Team:   [name], NP Christell Faith, PA-C Marrianne Mood, PA-C Cadence Woodson, [place]  COVID-19 Vaccine Information can be found at: [email or web address].uk For questions related to vaccine distribution or appointments, please email [email or web address] or call [phone].

## 2020-12-23 ENCOUNTER — Telehealth: Payer: Self-pay

## 2020-12-23 NOTE — Telephone Encounter (Signed)
Reach out to [name] [name] to f/u form yesterday's OV with Dr. Rockey Situ, Dr. Rockey Situ reach out and advised  "Lets start Farxiga 10 mg daily for his CHF symptoms.  Not sure if we have free 30-day coupon.  With the addition of this medication he would need to watch for weight loss from lots of pee pee.  With weight loss he needs to adjust his diuretic down"  Believe pt may have been on this earlier this year, pt thinks "I stopped d/u cost from my insurance". Advised pt will leave 2 boes of free samples for him up front, coupon card for 30-day free trail to register, and try to register for low co-pay savings card as well. Both will be with his 2 boxes of samples.  Also attaching PA application to fill out if those do not work for him. Pt verbalized understanding and very thankful. Will come pick up this afternoon before closing.   Farxiga 10 mg daily (2 boxes) Lot: [id number] Exp: 07/05/2023

## 2020-12-24 ENCOUNTER — Telehealth: Payer: Self-pay | Admitting: Cardiovascular Disease

## 2020-12-24 NOTE — Telephone Encounter (Signed)
Pt c/o medication issue:  1. Name of Medication: farxiga  2. How are you currently taking this medication (dosage and times per day)? Not started yet  3. Are you having a reaction (difficulty breathing--STAT)? no  4. What is your medication issue? Patient concerned about potential side effect of dizziness and wants to wait to talk with gollan again at ov before starting .   Patient has hx vertigo and does not want to exacerbate

## 2020-12-25 ENCOUNTER — Ambulatory Visit
Admission: RE | Admit: 2020-12-25 | Discharge: 2020-12-25 | Disposition: A | Discharge status: Home / Self Care | Payer: Medicare Other | Source: Ambulatory Visit | Attending: Gastroenterology | Admitting: Gastroenterology

## 2020-12-25 ENCOUNTER — Other Ambulatory Visit: Payer: Self-pay

## 2020-12-25 DIAGNOSIS — R109 Unspecified abdominal pain: Secondary | ICD-10-CM | POA: Diagnosis not present

## 2020-12-25 MED ORDER — IOHEXOL 350 MG/ML SOLN
100.0000 mL | Freq: Once | INTRAVENOUS | Status: AC | PRN
  Administered 2020-12-25: 100 mL via INTRAVENOUS

## 2020-12-25 NOTE — Telephone Encounter (Signed)
Was able to reach back out to [name] [name], advised on what PharmD stated regarding dizziness and Farxiga  "Risk of dizziness is pretty low. He can decrease this by making sure he stays hydrated with water."  [name] [name] stated he was reading the side affects to medication and he already having vertigo, he was unsure if he should take mediation, but will try new med and increase his hydration to see how he does and f/u with Dr. Rockey Situ at next [place] on 11/22.  [name] [name] is thankful for reaching back out, will call back with any further concerns.

## 2020-12-25 NOTE — Telephone Encounter (Signed)
Risk of dizziness is pretty low. He can decrease this by making sure he stays hydrated with water.

## 2020-12-29 DIAGNOSIS — J019 Acute sinusitis, unspecified: Secondary | ICD-10-CM | POA: Diagnosis not present

## 2020-12-31 ENCOUNTER — Telehealth: Payer: Self-pay | Admitting: Cardiovascular Disease

## 2020-12-31 ENCOUNTER — Ambulatory Visit
Admission: EM | Admit: 2020-12-31 | Discharge: 2020-12-31 | Disposition: A | Discharge status: Home / Self Care | Payer: Medicare Other | Attending: Emergency Medicine | Admitting: Emergency Medicine

## 2020-12-31 ENCOUNTER — Telehealth (INDEPENDENT_AMBULATORY_CARE_PROVIDER_SITE_OTHER): Payer: Medicare Other | Admitting: Family Medicine

## 2020-12-31 ENCOUNTER — Ambulatory Visit (INDEPENDENT_AMBULATORY_CARE_PROVIDER_SITE_OTHER): Payer: Medicare Other

## 2020-12-31 ENCOUNTER — Other Ambulatory Visit: Payer: Self-pay

## 2020-12-31 ENCOUNTER — Encounter: Payer: Self-pay | Admitting: Emergency Medicine

## 2020-12-31 DIAGNOSIS — Z20822 Contact with and (suspected) exposure to covid-19: Secondary | ICD-10-CM | POA: Diagnosis not present

## 2020-12-31 DIAGNOSIS — R0602 Shortness of breath: Secondary | ICD-10-CM | POA: Diagnosis not present

## 2020-12-31 DIAGNOSIS — R0600 Dyspnea, unspecified: Secondary | ICD-10-CM

## 2020-12-31 DIAGNOSIS — J0100 Acute maxillary sinusitis, unspecified: Secondary | ICD-10-CM | POA: Diagnosis not present

## 2020-12-31 NOTE — ED Triage Notes (Signed)
Pt presents today with wife. He reports that he has a cough for greater than 10 days. He went to [facility] on 12/29/20 and filled his Cough Medicine (only/written script). He did not realize he had two other scripts there for pickup (Cefdinir/Atrovent spray). Today he had a virtual visit with PCP and was suggested he come in to have chest xray and Covid test. He does report having a heart catherization on 12/22/20 for blockage and was told a stent could not be placed.

## 2020-12-31 NOTE — ED Provider Notes (Signed)
[facility]    CSN: [id number] Arrival date & time: 12/31/20  1220      History   Chief Complaint Chief Complaint  Patient presents with   Cough   Nasal Congestion    HPI [name] [name] is a 87 y.o. male.   Patient presents with persistent productive cough, chest soreness, sore throat and increased shortness of breath for 10 days.  Cough interfering with sleep.  Poor appetite tolerating fluids.  Has attempted use of over-the-counter Mucinex and codeine cough syrup with no success.  Seen by PCP on 12/29/2020 and completed telehealth visit this morning regarding symptoms.  Patient has not started using nasal spray or antibiotic prescribed, endorses that he did not know this medication was available.  Denies fever, chills, body aches, nasal congestion, rhinorrhea headaches, ear pain, wheezing, abdominal pain, nausea, vomiting, diarrhea.  History of heart failure, endorses that he recently had a 10 pound weight gain and use diuretic and has returned to baseline, morning weight 214.  History of diabetes, hypertension, hyperlipidemia, GERD, CAD, heart failure, BPH.   Past Medical History:  Diagnosis Date   (HFpEF) heart failure with preserved ejection fraction ([place])    a. 11/9369 Echo: nl systolic fxn, mild LVH, diastolic relaxation abnormality, mildly enlarged LA, mild Ao insufficiency; 01/2018 Echo: EF 55-60%, no rwma, GR2 DD, triv AI, mildly dil LA, mild inc PASP.   BPH (benign prostatic hyperplasia)    Cancer (HCC)    skin   Cataract    Choledocholithiasis    a. 05/2020 ERCP w/ CBD stone removal and biliary sphincterotomy.   Coronary artery disease    a. 01/2005 s/p CABG x 4: LIMA-LAD, VG-Diag, VG-OM3, VG-dRCA; b. 06/2013 Cath: patent grafts; c. 09/2015 Cath: VG->Diag 100, other grafts patent. LAD 100, RCA sev dzs->Med rx; d. 01/2018 Cath: LAD 100ost/p, LCX 50, RCA 100p/d, VG->OM3 100, VG->Diag 100, LIMA->LAD ok, VG->RPAV 22m-->Med rx.   COVID-19    07/2020 had paxlovid     Dyspnea    with exertion   Dysrhythmia    ED (erectile dysfunction)    Elevated LFTs    a. 05/2020 following CBD stone. 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   GERD (gastroesophageal reflux disease)    Hyperlipidemia    Hypertension    IDDM (insulin dependent diabetes mellitus) 2000   Osteoarthritis    PAH (pulmonary artery hypertension) ([place])    a. 01/2018 RHC: PA 51/22 (32).   Perirectal fistula    Pneumonia 12/2016   Shingles 09/04/2018   Tubular adenoma of colon 07/2012   Vertigo     Patient Active Problem List   Diagnosis Date Noted   Acute on chronic heart failure with preserved ejection fraction (HFpEF) ([place]) 12/09/2020   C. difficile diarrhea 10/05/2020   De Quervain's tenosynovitis, left 10/05/2020   Nail, injury by 08/27/2020   Abnormal MRI, lumbar spine 08/12/2020   History of COVID-19 07/22/2020   Melena 05/11/2020   Jaundice 05/11/2020   Choledocholithiasis    AKI (acute kidney injury) ([place]) 04/08/2020   Suspected COVID-19 virus infection 04/06/2020   Candidal intertrigo 03/12/2020   Scrotal lesion 03/12/2020   Cellulitis 05/12/2019   Fibula fracture 05/09/2019   Epigastric pain 05/09/2019   Postherpetic neuralgia 05/09/2019   B12 deficiency 05/09/2019   Pulmonary hypertension, unspecified ([place]) 04/05/2019   Proteinuria, unspecified 04/05/2019   Atypical chest pain 01/08/2019   Weight loss 12/01/2018   Vertigo 10/15/2018   Diabetic retinopathy ([place])  09/20/2018   Bile duct abnormality 09/12/2018   Stable angina (HCC) 03/12/2018   Chronic low back pain 03/05/2018   Skin tear of hand without complication, initial encounter 03/05/2018   Abnormal breast exam 01/18/2018   PAC (premature atrial contraction) 01/18/2018   Neuropathic pain 01/18/2018   Demand ischemia ([place])    Acute on chronic kidney failure ([place]) 01/07/2018   Bell's palsy 08/18/2017   H/O cold sores 08/18/2017   Irregular heart beat 11/29/2016    Insomnia 11/10/2016   PLMD (periodic limb movement disorder) 08/17/2016   Mild OSA 08/17/2016   Intolerance of continuous positive airway pressure (CPAP) ventilation 08/17/2016   Chronic arthritis 06/02/2016   CKD (chronic kidney disease) stage 3, GFR 30-59 ml/min (HCC) 66/59/9357   Diastolic dysfunction 01/77/9390   Osteoarthritis 05/04/2016   Presence of aortocoronary bypass graft 05/04/2016   Chronic pain of both knees 10/06/2015   Sensory abnormality of thoracic dermatome distribution 10/06/2015   Spondylosis of lumbar region without myelopathy or radiculopathy 10/06/2015   Unstable angina ([place]) 09/24/2015   S/P CABG x 4    Shortness of breath 05/14/2015   Thrombocytopenia ([place]) 02/07/2015   Type 2 diabetes mellitus with complication, without long-term current use of insulin ([place]) 02/06/2015   Chronic diastolic CHF (congestive heart failure) ([place]) 01/01/2014   CAD (coronary artery disease) 06/03/2013   Diabetic neuropathy ([place]) 11/12/2012   BPH (benign prostatic hyperplasia)    GERD (gastroesophageal reflux disease)    Hyperlipidemia 09/15/2009   Essential hypertension 09/15/2009    Past Surgical History:  Procedure Laterality Date   BACK SURGERY     CARDIAC CATHETERIZATION  06/05/2013   [facility]    CARDIAC CATHETERIZATION N/A 09/24/2015   Procedure: Right Heart Cath and Coronary/Graft Angiography;  Surgeon: Minna Merritts, MD;  Location: [place] [facility];  Service: Cardiovascular;  Laterality: N/A;   CATARACT EXTRACTION  sept 2013   right   CATARACT EXTRACTION W/PHACO Left 03/28/2017   Procedure: CATARACT EXTRACTION PHACO AND INTRAOCULAR LENS PLACEMENT (IOC);  Surgeon: Birder Robson, MD;  Location: [facility];  Service: Ophthalmology;  Laterality: Left;  [place] 00:42.0 AP% 15.0 CDE 6.31 Fluid Pack lot # [id number]   CHOLECYSTECTOMY  05/15/2011   Procedure: LAPAROSCOPIC CHOLECYSTECTOMY;  Surgeon: Rolm Bookbinder, MD;  Location: [facility];  Service: General;  Laterality:  N/A;   COLONOSCOPY W/ POLYPECTOMY     CORONARY ARTERY BYPASS GRAFT  2007   x 4   CORONARY STENT INTERVENTION N/A 12/08/2020   Procedure: CORONARY STENT INTERVENTION;  Surgeon: Nelva Bush, MD;  Location: [place] [facility];  Service: Cardiovascular;  Laterality: N/A;   ERCP N/A 05/05/2020   Procedure: ENDOSCOPIC RETROGRADE CHOLANGIOPANCREATOGRAPHY (ERCP);  Surgeon: Lucilla Lame, MD;  Location: [facility]OSCOPY;  Service: Endoscopy;  Laterality: N/A;   ERCP N/A 05/12/2020   Procedure: ENDOSCOPIC RETROGRADE CHOLANGIOPANCREATOGRAPHY (ERCP);  Surgeon: Lucilla Lame, MD;  Location: [facility] ENDOSCOPY;  Service: Endoscopy;  Laterality: N/A;   ESOPHAGOGASTRODUODENOSCOPY N/A 05/12/2020   Procedure: ESOPHAGOGASTRODUODENOSCOPY (EGD);  Surgeon: Lucilla Lame, MD;  Location: [facility] ENDOSCOPY;  Service: Endoscopy;  Laterality: N/A;   EYE SURGERY     FOOT SURGERY  2012   right foot   JOINT REPLACEMENT  8/10   Right THR--[name]   LEFT AND RIGHT HEART CATHETERIZATION WITH CORONARY/GRAFT ANGIOGRAM N/A 06/05/2013   Procedure: LEFT AND RIGHT HEART CATHETERIZATION WITH Beatrix Fetters;  Surgeon: Peter M Martinique, MD;  Location: [facility] CATH LAB;  Service: Cardiovascular;  Laterality: N/A;   [place]  Prostate photovaporization  5/16   Dr Budd Palmer   RIGHT/LEFT HEART CATH AND CORONARY/GRAFT ANGIOGRAPHY N/A 01/05/2018   Procedure: RIGHT/LEFT HEART CATH AND CORONARY/GRAFT ANGIOGRAPHY;  Surgeon: Minna Merritts, MD;  Location: [place] [facility];  Service: Cardiovascular;  Laterality: N/A;   RIGHT/LEFT HEART CATH AND CORONARY/GRAFT ANGIOGRAPHY Bilateral 12/08/2020   Procedure: RIGHT/LEFT HEART CATH AND CORONARY/GRAFT ANGIOGRAPHY;  Surgeon: Nelva Bush, MD;  Location: [place] [facility];  Service: Cardiovascular;  Laterality: Bilateral;       Home Medications    Prior to Admission medications   Medication Sig Start Date End Date Taking? Authorizing Provider  aspirin EC 81 MG tablet  Take 81 mg by mouth every other day. In the morning    [provider]  BD PEN NEEDLE NANO U/F 32G X 4 MM MISC USE AS DIRECTED 10/27/20   [name], MD  carvedilol (COREG) 12.5 MG tablet TAKE ONE (1) TABLET BY MOUTH TWO TIMES PER DAY 10/07/20   Minna Merritts, MD  Cholecalciferol (VITAMIN D3) 50 MCG (2000 UT) TABS Take 2,000 Units by mouth in the morning.    [provider]  doxazosin (CARDURA) 2 MG tablet Take 1 tablet (2 mg total) by mouth 2 (two) times daily. 09/02/20   Minna Merritts, MD  finasteride (PROSCAR) 5 MG tablet Take 1 tablet (5 mg total) by mouth daily. Patient taking differently: Take 5 mg by mouth every evening. 03/11/20   [name], MD  gabapentin (NEURONTIN) 100 MG capsule TAKE 1 CAPSULE BY MOUTH 3 TIMES DAILY 12/04/20   [name], MD  HYDROcodone-acetaminophen (NORCO) 5-325 MG tablet Take 1 tablet by mouth 3 (three) times daily as needed for moderate pain. Patient taking differently: Take 1 tablet by mouth daily as needed for moderate pain (severe back pain.). 08/12/20   McLean-Scocuzza, Nino Glow, MD  isosorbide mononitrate (IMDUR) 30 MG 24 hr tablet Take 1 tablet (30 mg total) by mouth daily. 12/10/20   Theora Gianotti, NP  loperamide (IMODIUM A-D) 2 MG tablet Take 1 tablet (2 mg total) by mouth 2 (two) times daily as needed for up to 60 doses for diarrhea or loose stools. Patient taking differently: Take 2 mg by mouth in the morning. 11/13/20   Virgel Manifold, MD  Multiple Vitamins-Minerals (PRESERVISION AREDS 2 PO) Take 1 tablet by mouth in the morning and at bedtime.    [provider]  omeprazole (PRILOSEC) 20 MG capsule TAKE 1 CAPSULE BY MOUTH TWICE DAILY 11/15/20   [name], MD  potassium chloride (KLOR-CON) 10 MEQ tablet Take 1 tablet (10 mEq total) by mouth daily as needed. Take with torsemide 12/22/20   Minna Merritts, MD  Probiotic Product (PROBIOTIC-10 PO) Take 1 capsule by mouth in the morning.     [provider]  torsemide (DEMADEX) 20 MG tablet For weight 210 to 215 take torsemide 20 mg with potassium 10 meq. For weight > Torsemide 20 mg twice a day  (8 am and 2 pm) With potassium 20 meq 12/22/20   Gollan, Kathlene November, MD  TOUJEO SOLOSTAR 300 UNIT/ML Solostar Pen INJECT 50 UNITS DAILY MAY INCREASE 2U EVERY 3 DAYS UNTIL FBG IS AT GOAL UP TO 80 UNITS DAILY Patient taking differently: Inject 41 Units into the skin in the morning. 06/22/20   [name], MD  valsartan (DIOVAN) 320 MG tablet Take 1 tablet (320 mg total) by mouth daily. 10/23/20   Theora Gianotti, NP  Family History Family History  Problem Relation Age of Onset   Lung cancer Mother    Heart disease Father    Colon cancer Neg Hx    Breast cancer Neg Hx     Social History Social History   Tobacco Use   Smoking status: Never   Smokeless tobacco: Never  Vaping Use   Vaping Use: Never used  Substance Use Topics   Alcohol use: Yes    Comment: occ cocktail   Drug use: No     Allergies   Cortisone and Metformin   Review of Systems Review of Systems  Constitutional: Negative.   HENT:  Positive for sore throat. Negative for congestion, dental problem, drooling, ear discharge, ear pain, facial swelling, hearing loss, mouth sores, nosebleeds, postnasal drip, rhinorrhea, sinus pressure, sinus pain, sneezing, tinnitus, trouble swallowing and voice change.   Respiratory:  Positive for cough and shortness of breath. Negative for apnea, choking, chest tightness, wheezing and stridor.   Cardiovascular: Negative.   Gastrointestinal: Negative.   Skin: Negative.     Physical Exam Triage Vital Signs ED Triage Vitals  Enc Vitals Group     BP 12/31/20 1410 117/70     Pulse Rate 12/31/20 1410 64     Resp 12/31/20 1410 20     Temp 12/31/20 1410 98.2 F (36.8 C)     Temp Source 12/31/20 1410 Oral     SpO2 12/31/20 1410 99 %     Weight --      Height --      Head Circumference --      [place]  Flow --      Pain Score 12/31/20 1408 0     Pain Loc --      Pain Edu? --      Excl. in [place]? --    No data found.  Updated Vital Signs BP 117/70 (BP Location: Left Arm)   Pulse 64   Temp 98.2 F (36.8 C) (Oral)   Resp 20   SpO2 99%   Visual Acuity Right Eye Distance:   Left Eye Distance:   Bilateral Distance:    Right Eye Near:   Left Eye Near:    Bilateral Near:     Physical Exam Constitutional:      Appearance: Normal appearance. He is normal weight.  HENT:     Head: Normocephalic.     Right Ear: Tympanic membrane, ear canal and external ear normal.     Left Ear: Tympanic membrane, ear canal and external ear normal.     Nose: Congestion present.     Mouth/Throat:     Mouth: Mucous membranes are moist.     Pharynx: Oropharynx is clear.  Eyes:     Extraocular Movements: Extraocular movements intact.  Cardiovascular:     Rate and Rhythm: Normal rate and regular rhythm.     Pulses: Normal pulses.     Heart sounds: Normal heart sounds.  Pulmonary:     Effort: Pulmonary effort is normal.     Breath sounds: Normal breath sounds.  Musculoskeletal:     Cervical back: Normal range of motion and neck supple.  Skin:    General: Skin is warm and dry.  Neurological:     Mental Status: He is alert and oriented to person, place, and time. Mental status is at baseline.  Psychiatric:        Mood and Affect: Mood normal.        Behavior: Behavior normal.  UC Treatments / Results  Labs (all labs ordered are listed, but only abnormal results are displayed) Labs Reviewed  SARS CORONAVIRUS 2 (TAT 6-24 HRS)    EKG   Radiology No results found.  Procedures Procedures (including critical care time)  Medications Ordered in UC Medications - No data to display  Initial Impression / Assessment and Plan / UC Course  I have reviewed the triage vital signs and the nursing notes.  Pertinent labs & imaging results that were available during my care of the patient were  reviewed by me and considered in my medical decision making (see chart for details).  Acute Non recurrent maxillary sinus  1 Chest x-ray negative 2.  Advised patient to retrieve antibiotic and nasal spray from pharmacy and continue use of over-the-counter Mucinex and codeine cough syrup prescribed by PCP 3.  Follow-up with PCP in 1 to 2 weeks if no improvement seen Final Clinical Impressions(s) / UC Diagnoses   Final diagnoses:  None   Discharge Instructions   None    ED Prescriptions   None    PDMP not reviewed this encounter.   Hans Eden, [place] 12/31/20 [phone]

## 2020-12-31 NOTE — Telephone Encounter (Signed)
Incoming triage call form [name] [name] with c/o cough and rib pain r/t coughing. SOB associated with cough, however chronic hx of same. Pt also reports nasal congestion, was seen by [facility] in [place] a few days ago and given script for meds.  cefdinir (OMNICEF) 300 mg capsule; Take 1 capsule (300 mg total) by mouth every 12 (twelve) hours for 7 days - ipratropium (ATROVENT) 0.06 % nasal spray; Place 2 sprays into both nostrils 3 (three) times daily as needed for Rhinitis - codeine-guaifenesin 10-100 mg/5 mL oral liquid; Take 5 mLs by mouth every 4 (four) hours as needed  Pt reports cough worse, now "high pitched", not so much productive, called [place] on-call tele-visit and they advised pt of need for CXR and COVID test. Pt calling in to have these order as PCP is out the office to have order or to be seen. Dr. Rockey Situ also not in office and we do not have routine CXR or have access to COVID testing.   Advised to [facility] for CXR and COVID testing. [name] verbalized understanding and is thankful for speaking with him and the advice. Will call back with anything further.

## 2020-12-31 NOTE — Patient Instructions (Signed)
Please seek in person care right away today for your difficulty breathing.  If you are having worsening or life-threatening symptoms, please call 911 and seek emergency care.  I hope you are feeling better soon!  It was nice to meet you today. I help [place] out with telemedicine visits on Tuesdays and Thursdays and am available for visits on those days. If you have any concerns or questions following this visit please schedule a follow up visit with your Primary Care doctor or seek care at a local urgent care clinic to avoid delays in care.

## 2020-12-31 NOTE — Progress Notes (Signed)
Virtual Visit via Video Note  I connected with Gwyndolyn Saxon  on 12/31/20 at 10:40 AM EDT by a video enabled telemedicine application and verified that I am speaking with the correct person using two identifiers.  Location patient: home, [place] Location provider:work or home office Persons participating in the virtual visit: patient, provider, pt wife  I discussed the limitations of evaluation and management by telemedicine and the availability of in person appointments. The patient expressed understanding and agreed to proceed.   HPI:  Acute telemedicine visit for cough: -Onset: about 1 week ago -Symptoms include: SOB - worsening today, cough, congestion -report O2 was 92 at [facility] yesterday - reports they barely did an exam, did not check his heart or lungs and gave him codeine, reports they did a 19 second exam -Denies: CP (except when coughs), body aches, fever, NVD, inability to eat/drink/get out of bed -Has tried: codeine cough medication  -Pertinent past medical history: recent heart cath attempt last week - but unsuccessful per pt, also reports cardiologist is treating for fluid on the lungs - has not taken fluid pill the last few days as weight was back to normal a few days ago -Pertinent medication allergies:  Allergies  Allergen Reactions   Cortisone Other (See Comments)    Leg Cramps   Metformin Diarrhea  -COVID-19 vaccine status: had 2 doses and booster  ROS: See pertinent positives and negatives per HPI.  Past Medical History:  Diagnosis Date   (HFpEF) heart failure with preserved ejection fraction ([place])    a. 04/2977 Echo: nl systolic fxn, mild LVH, diastolic relaxation abnormality, mildly enlarged LA, mild Ao insufficiency; 01/2018 Echo: EF 55-60%, no rwma, GR2 DD, triv AI, mildly dil LA, mild inc PASP.   BPH (benign prostatic hyperplasia)    Cancer (HCC)    skin   Cataract    Choledocholithiasis    a. 05/2020 ERCP w/ CBD stone removal and biliary sphincterotomy.   Coronary  artery disease    a. 01/2005 s/p CABG x 4: LIMA-LAD, VG-Diag, VG-OM3, VG-dRCA; b. 06/2013 Cath: patent grafts; c. 09/2015 Cath: VG->Diag 100, other grafts patent. LAD 100, RCA sev dzs->Med rx; d. 01/2018 Cath: LAD 100ost/p, LCX 50, RCA 100p/d, VG->OM3 100, VG->Diag 100, LIMA->LAD ok, VG->RPAV 18m-->Med rx.   COVID-19    07/2020 had paxlovid    Dyspnea    with exertion   Dysrhythmia    ED (erectile dysfunction)    Elevated LFTs    a. 05/2020 following CBD stone. 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   GERD (gastroesophageal reflux disease)    Hyperlipidemia    Hypertension    IDDM (insulin dependent diabetes mellitus) 2000   Osteoarthritis    PAH (pulmonary artery hypertension) ([place])    a. 01/2018 RHC: PA 51/22 (32).   Perirectal fistula    Pneumonia 12/2016   Shingles 09/04/2018   Tubular adenoma of colon 07/2012   Vertigo     Past Surgical History:  Procedure Laterality Date   BACK SURGERY     CARDIAC CATHETERIZATION  06/05/2013   [facility]    CARDIAC CATHETERIZATION N/A 09/24/2015   Procedure: Right Heart Cath and Coronary/Graft Angiography;  Surgeon: Minna Merritts, MD;  Location: [facility];  Service: Cardiovascular;  Laterality: N/A;   CATARACT EXTRACTION  sept 2013   right   CATARACT EXTRACTION W/PHACO Left 03/28/2017   Procedure: CATARACT EXTRACTION PHACO AND INTRAOCULAR LENS PLACEMENT (IOC);  Surgeon: Birder Robson, MD;  Location: [facility];  Service: Ophthalmology;  Laterality: Left;  [place] 00:42.0 AP% 15.0 CDE 6.31 Fluid Pack lot # [id number]   CHOLECYSTECTOMY  05/15/2011   Procedure: LAPAROSCOPIC CHOLECYSTECTOMY;  Surgeon: Rolm Bookbinder, MD;  Location: [facility];  Service: General;  Laterality: N/A;   COLONOSCOPY W/ POLYPECTOMY     CORONARY ARTERY BYPASS GRAFT  2007   x 4   CORONARY STENT INTERVENTION N/A 12/08/2020   Procedure: CORONARY STENT INTERVENTION;  Surgeon: Nelva Bush, MD;  Location: [place]  [facility];  Service: Cardiovascular;  Laterality: N/A;   ERCP N/A 05/05/2020   Procedure: ENDOSCOPIC RETROGRADE CHOLANGIOPANCREATOGRAPHY (ERCP);  Surgeon: Lucilla Lame, MD;  Location[facility] ENDOSCOPY;  Service: Endoscopy;  Laterality: N/A;   ERCP N/A 05/12/2020   Procedure: ENDOSCOPIC RETROGRADE CHOLANGIOPANCREATOGRAPHY (ERCP);  Surgeon: Lucilla Lame, MD;  Location: [facility] ENDOSCOPY;  Service: Endoscopy;  Laterality: N/A;   ESOPHAGOGASTRODUODENOSCOPY N/A 05/12/2020   Procedure: ESOPHAGOGASTRODUODENOSCOPY (EGD);  Surgeon: Lucilla Lame, MD;  Location: [facility] ENDOSCOPY;  Service: Endoscopy;  Laterality: N/A;   EYE SURGERY     FOOT SURGERY  2012   right foot   JOINT REPLACEMENT  8/10   Right THR--[name]   LEFT AND RIGHT HEART CATHETERIZATION WITH CORONARY/GRAFT ANGIOGRAM N/A 06/05/2013   Procedure: LEFT AND RIGHT HEART CATHETERIZATION WITH Beatrix Fetters;  Surgeon: Peter M Martinique, MD;  Location: [facility] CATH LAB;  Service: Cardiovascular;  Laterality: N/A;   LUMBAR LAMINECTOMY  1989   Prostate photovaporization  5/16   Dr Budd Palmer   RIGHT/LEFT HEART CATH AND CORONARY/GRAFT ANGIOGRAPHY N/A 01/05/2018   Procedure: RIGHT/LEFT HEART CATH AND CORONARY/GRAFT ANGIOGRAPHY;  Surgeon: Minna Merritts, MD;  Location: [place] [facility];  Service: Cardiovascular;  Laterality: N/A;   RIGHT/LEFT HEART CATH AND CORONARY/GRAFT ANGIOGRAPHY Bilateral 12/08/2020   Procedure: RIGHT/LEFT HEART CATH AND CORONARY/GRAFT ANGIOGRAPHY;  Surgeon: Nelva Bush, MD;  Location: [place] [facility];  Service: Cardiovascular;  Laterality: Bilateral;     Current Outpatient Medications:    aspirin EC 81 MG tablet, Take 81 mg by mouth every other day. In the morning, Disp: , Rfl:    BD PEN NEEDLE NANO U/F 32G X 4 MM MISC, USE AS DIRECTED, Disp: 100 each, Rfl: 0   carvedilol (COREG) 12.5 MG tablet, TAKE ONE (1) TABLET BY MOUTH TWO TIMES PER DAY, Disp: 180 tablet, Rfl: 0   Cholecalciferol (VITAMIN D3) 50 MCG (2000 UT) TABS,  Take 2,000 Units by mouth in the morning., Disp: , Rfl:    doxazosin (CARDURA) 2 MG tablet, Take 1 tablet (2 mg total) by mouth 2 (two) times daily., Disp: 60 tablet, Rfl: 6   finasteride (PROSCAR) 5 MG tablet, Take 1 tablet (5 mg total) by mouth daily. (Patient taking differently: Take 5 mg by mouth every evening.), Disp: 90 tablet, Rfl: 1   gabapentin (NEURONTIN) 100 MG capsule, TAKE 1 CAPSULE BY MOUTH 3 TIMES DAILY, Disp: 90 capsule, Rfl: 1   HYDROcodone-acetaminophen (NORCO) 5-325 MG tablet, Take 1 tablet by mouth 3 (three) times daily as needed for moderate pain. (Patient taking differently: Take 1 tablet by mouth daily as needed for moderate pain (severe back pain.).), Disp: 90 tablet, Rfl: 0   isosorbide mononitrate (IMDUR) 30 MG 24 hr tablet, Take 1 tablet (30 mg total) by mouth daily., Disp: 30 tablet, Rfl: 6   loperamide (IMODIUM A-D) 2 MG tablet, Take 1 tablet (2 mg total) by mouth 2 (two) times daily as needed for up to 60 doses for diarrhea or loose stools. (Patient  taking differently: Take 2 mg by mouth in the morning.), Disp: 60 tablet, Rfl: 0   Multiple Vitamins-Minerals (PRESERVISION AREDS 2 PO), Take 1 tablet by mouth in the morning and at bedtime., Disp: , Rfl:    omeprazole (PRILOSEC) 20 MG capsule, TAKE 1 CAPSULE BY MOUTH TWICE DAILY, Disp: 60 capsule, Rfl: 1   potassium chloride (KLOR-CON) 10 MEQ tablet, Take 1 tablet (10 mEq total) by mouth daily as needed. Take with torsemide, Disp: 90 tablet, Rfl: 3   Probiotic Product (PROBIOTIC-10 PO), Take 1 capsule by mouth in the morning., Disp: , Rfl:    torsemide (DEMADEX) 20 MG tablet, For weight 210 to 215 take torsemide 20 mg with potassium 10 meq. For weight > Torsemide 20 mg twice a day  (8 am and 2 pm) With potassium 20 meq, Disp: 200 tablet, Rfl: 3   TOUJEO SOLOSTAR 300 UNIT/ML Solostar Pen, INJECT 50 UNITS DAILY MAY INCREASE 2U EVERY 3 DAYS UNTIL FBG IS AT GOAL UP TO 80 UNITS DAILY (Patient taking differently: Inject 41 Units into  the skin in the morning.), Disp: 22.5 mL, Rfl: 6   valsartan (DIOVAN) 320 MG tablet, Take 1 tablet (320 mg total) by mouth daily., Disp: 90 tablet, Rfl: 3  EXAM:  VITALS per patient if applicable:  GENERAL: alert, oriented, appears well and in no acute distress  HEENT: atraumatic, conjunttiva clear, no obvious abnormalities on inspection of external nose and ears  NECK: normal movements of the head and neck  LUNGS: on inspection no signs of respiratory distress, breathing rate appears normal, no obvious gross SOB, gasping or wheezing  CV: no obvious cyanosis  MS: moves all visible extremities without noticeable abnormality  PSYCH/NEURO: pleasant and cooperative, no obvious depression or anxiety, speech and thought processing grossly intact  ASSESSMENT AND PLAN:  Discussed the following assessment and plan:  Dyspnea, unspecified type  -we discussed possible serious and likely etiologies, options for evaluation and workup, limitations of telemedicine visit vs in person visit, treatment, treatment risks and precautions.Query bronchitis, pneumonia, covid19, pulm edema, cardiac related vs other. Advised needs inperson evaluation likely with CRX, heart and lung eval, covid/flu/viral testing, etc. They agree to go to local [facility] today. They prefer to go via private vehicle.    I discussed the assessment and treatment plan with the patient. The patient was provided an opportunity to ask questions and all were answered. The patient agreed with the plan and demonstrated an understanding of the instructions.     Lucretia Kern, DO

## 2020-12-31 NOTE — Telephone Encounter (Signed)
Pt c/o Shortness Of Breath: STAT if SOB developed within the last 24 hours or pt is noticeably SOB on the phone  1. Are you currently SOB (can you hear that pt is SOB on the phone)? Yes   2. How long have you been experiencing SOB? Sick with congestion cough a week worsening in last 3 days   3. Are you SOB when sitting or when up moving around? All the time hard to lay down improves slightly at rest   4. Are you currently experiencing any other symptoms?  Hasnt taken fluid pill 2 days as he is back down to normal weight    Patient did an urgent care visit and a virtual visit and was advised to call cardiology for chest xray covid test and oximetry check.

## 2020-12-31 NOTE — Discharge Instructions (Signed)
Your chest x-ray did not show structural changes, fluid, infection or inflammation  Your COVID test is pending for 24 hours, you will be called if positive, you are past quarantine requirements if positive   Pick up nasal spray and antibiotic for use for treatment of a sinus infection  Can continue use of codeine cough syrup and Mucinex   Follow up with primary doctor in two weeks if no improvement seen

## 2021-01-01 LAB — SARS CORONAVIRUS 2 (TAT 6-24 HRS): SARS Coronavirus 2: NEGATIVE

## 2021-01-07 ENCOUNTER — Telehealth: Payer: Self-pay | Admitting: *Deleted

## 2021-01-07 ENCOUNTER — Other Ambulatory Visit (INDEPENDENT_AMBULATORY_CARE_PROVIDER_SITE_OTHER): Payer: Medicare Other

## 2021-01-07 ENCOUNTER — Other Ambulatory Visit: Payer: Self-pay

## 2021-01-07 DIAGNOSIS — Z79899 Other long term (current) drug therapy: Secondary | ICD-10-CM

## 2021-01-07 DIAGNOSIS — I208 Other forms of angina pectoris: Secondary | ICD-10-CM

## 2021-01-07 DIAGNOSIS — I272 Pulmonary hypertension, unspecified: Secondary | ICD-10-CM

## 2021-01-07 DIAGNOSIS — I5032 Chronic diastolic (congestive) heart failure: Secondary | ICD-10-CM

## 2021-01-07 NOTE — Telephone Encounter (Signed)
Pt came into office today for lab work and wanted to verify if he should be taking Zetia 10 mg qd. Pt mentioned he hasn't been taking it for a while like 2 months. Pt is aware that it isn't on his current medication list and or past two visits medication list. Pt would like to be contacted and like verification from provider. Please advise.

## 2021-01-07 NOTE — Telephone Encounter (Signed)
Patient came in asking about his ezetimibe medication. I reviewed chart and do not see it as an active medication. Called and inquired if anyone had stopped that medication to his knowledge. He does not recall. He did inquire if he was on something else for cholesterol and again I don't see any active medication on his list.   Results for [name] [name], [name] [name] (MRN [id number]) as of 01/07/2021 15:50  Ref. Range 03/11/2020 15:25  Total CHOL/HDL Ratio Unknown 2  Cholesterol Latest Ref Range: 0 - 200 mg/dL 93  HDL Cholesterol Latest Ref Range: >39.00 mg/dL 37.60 (L)  LDL (calc) Latest Ref Range: 0 - 99 mg/dL 23  NonHDL Unknown 55.27  Triglycerides Latest Ref Range: 0.0 - 149.0 mg/dL 163.0 (H)  VLDL Latest Ref Range: 0.0 - 40.0 mg/dL 32.6    Advised I can send to Dr. Rockey Situ for his review but I also see he has upcoming appointment. Confirmed appointment and advised I would call back if he should reply prior to that date. He was appreciative for the call and was agreeable with this plan.

## 2021-01-08 DIAGNOSIS — H43813 Vitreous degeneration, bilateral: Secondary | ICD-10-CM | POA: Diagnosis not present

## 2021-01-08 DIAGNOSIS — H43391 Other vitreous opacities, right eye: Secondary | ICD-10-CM | POA: Diagnosis not present

## 2021-01-08 DIAGNOSIS — H43821 Vitreomacular adhesion, right eye: Secondary | ICD-10-CM | POA: Diagnosis not present

## 2021-01-08 DIAGNOSIS — H353211 Exudative age-related macular degeneration, right eye, with active choroidal neovascularization: Secondary | ICD-10-CM | POA: Diagnosis not present

## 2021-01-08 LAB — BASIC METABOLIC PANEL
BUN/Creatinine Ratio: 14 (ref 10–24)
BUN: 24 mg/dL (ref 8–27)
CO2: 22 mmol/L (ref 20–29)
Calcium: 8.7 mg/dL (ref 8.6–10.2)
Chloride: 105 mmol/L (ref 96–106)
Creatinine, Ser: 1.7 mg/dL — ABNORMAL HIGH (ref 0.76–1.27)
Glucose: 238 mg/dL — ABNORMAL HIGH (ref 70–99)
Potassium: 4.4 mmol/L (ref 3.5–5.2)
Sodium: 142 mmol/L (ref 134–144)
eGFR: 39 mL/min/{1.73_m2} — ABNORMAL LOW (ref >59)

## 2021-01-11 MED ORDER — EZETIMIBE 10 MG PO TABS: 10.0000 mg | ORAL_TABLET | Freq: Every day | ORAL | 3 refills | Status: DC

## 2021-01-11 NOTE — Telephone Encounter (Signed)
Returning pt's call to f/u on Zetia, per Dr. Rockey Situ  He should be taking zetia 10 daily  Thx  TG   Pt verbalized understanding, request another script sent in to [facility]. Also was able to review pt's lab results with him from a few days ago. Pt agrees with referral to cardiac rehab.   BMP  Sugars running high  Creatinine high end of his range  Okay to run the renal function at this range if some improvement in shortness of breath  -He is interested in cardiac rehab at the hospital for chronic stable angina, pulmonary hypertension   Otherwise all questions were address and no additional concerns at this time. Agreeable to plan, will call back for anything further.

## 2021-01-14 ENCOUNTER — Other Ambulatory Visit: Payer: Self-pay | Admitting: Family Medicine

## 2021-01-20 DIAGNOSIS — D2261 Melanocytic nevi of right upper limb, including shoulder: Secondary | ICD-10-CM | POA: Diagnosis not present

## 2021-01-20 DIAGNOSIS — D692 Other nonthrombocytopenic purpura: Secondary | ICD-10-CM | POA: Diagnosis not present

## 2021-01-20 DIAGNOSIS — L570 Actinic keratosis: Secondary | ICD-10-CM | POA: Diagnosis not present

## 2021-01-20 DIAGNOSIS — L821 Other seborrheic keratosis: Secondary | ICD-10-CM | POA: Diagnosis not present

## 2021-01-20 DIAGNOSIS — D1801 Hemangioma of skin and subcutaneous tissue: Secondary | ICD-10-CM | POA: Diagnosis not present

## 2021-01-20 DIAGNOSIS — D485 Neoplasm of uncertain behavior of skin: Secondary | ICD-10-CM | POA: Diagnosis not present

## 2021-01-20 DIAGNOSIS — D225 Melanocytic nevi of trunk: Secondary | ICD-10-CM | POA: Diagnosis not present

## 2021-01-20 DIAGNOSIS — Z85828 Personal history of other malignant neoplasm of skin: Secondary | ICD-10-CM | POA: Diagnosis not present

## 2021-01-20 DIAGNOSIS — C44319 Basal cell carcinoma of skin of other parts of face: Secondary | ICD-10-CM | POA: Diagnosis not present

## 2021-01-26 ENCOUNTER — Other Ambulatory Visit: Payer: Self-pay

## 2021-01-26 ENCOUNTER — Encounter: Payer: Self-pay | Admitting: Cardiovascular Disease

## 2021-01-26 ENCOUNTER — Ambulatory Visit: Payer: Medicare Other | Admitting: Cardiovascular Disease

## 2021-01-26 VITALS — BP 138/56 | HR 61 | Ht 72.0 in | Wt 221.5 lb

## 2021-01-26 DIAGNOSIS — I25118 Atherosclerotic heart disease of native coronary artery with other forms of angina pectoris: Secondary | ICD-10-CM

## 2021-01-26 DIAGNOSIS — E785 Hyperlipidemia, unspecified: Secondary | ICD-10-CM

## 2021-01-26 DIAGNOSIS — E782 Mixed hyperlipidemia: Secondary | ICD-10-CM

## 2021-01-26 DIAGNOSIS — I5032 Chronic diastolic (congestive) heart failure: Secondary | ICD-10-CM

## 2021-01-26 DIAGNOSIS — Z79899 Other long term (current) drug therapy: Secondary | ICD-10-CM

## 2021-01-26 MED ORDER — ROSUVASTATIN CALCIUM 20 MG PO TABS: 20.0000 mg | ORAL_TABLET | Freq: Every day | ORAL | 3 refills | Status: DC

## 2021-01-26 NOTE — Progress Notes (Signed)
I connected with  [name] [name] on 01/26/21   Evaluation Performed:  Follow-up visit  Date:  01/26/2021   ID:  [name], [name] 03/18/1933, MRN [id number]  Patient Location:  [address] [id number]   Provider location:   [facility], [place] office  PCP:  [name] Haven, MD  Cardiologist:  [name] [name] [facility]   Chief Complaint  Patient presents with   1 month follow up     Patient c/o shortness of breath with little to no exertion. Medications reviewed by the patient verbally.     History of Present Illness:    [name] [name] is a 87 y.o. male  past medical history of coronary artery disease, bypass surgery in November 2006, Stress test March 2017, cath 09/2015, medical management Occluded proximal LAD  Critical/severe native RCA disease  Vein graft to the diagonal is occluded  Other vein grafts patent to the RCA/PDA, vein graft to the OM 3, LIMA graft  EF 55% in 12/2013 diabetes, numbers running high obesity,  hyperlipidemia,  hypertension,  erectile dysfunction Chronic SOB, nonsmoker Postherpetic neuralgia left flank Stable angina, chronic diastolic CHF, chronic shortness of breath Pulmonary hypertension on [name] heart catheterization who presents for routine followup of his coronary artery disease, and hypertension, chronic diastolic CHF, pulmonary hypertension  Seen in clinic 1 month ago, post catheterization, pulmonary hypertension with elevated wedge pressures Lasix stopped and was changed to torsemide with regimen as below  He is down 5 pounds on today's visit, wife feels his breathing is better Creatinine up to 1.72 weeks ago Denies significant leg swelling, Continues to have chronic shortness of breath but wife feels there is no improvement  Recent CT chest reviewed with him, small pleural effusion  Visit one months ago: Stop lasix Weight at home 221 For weight 210 to 215 take torsemide 20 mg  with potassium 10 meq For weight > 215 please take Torsemide 20 mg twice a day  (8 am and 2 pm) With potassium 20 meq  Lab work reviewed from 2 weeks ago CR 1.7, BUN 24 Glucose 238  Dizziness on farxiga, took samples, stopped it  Getting over a cold.URI Weight 210 to 215  The medication confusion, did not bring blood pressures with him or his medication list  EKG personally reviewed by myself on todays visit Normal sinus rhythm rate 61 bpm [name] bundle branch block T wave abnormality inferior leads  Other past medical history reviewed  Cardiac catheterization December 08, 2020 Severe native coronary artery disease with chronic total occlusions of the ostial LAD and mid RCA.  There is also moderate to severe diffuse disease involving the mid/distal LAD beyond the LIMA anastomosis as well as the ostial through mid LCx. Widely patent LIMA to LAD graft. Severely diseased SVG to PDA with sequential 99% and 50% mid graft stenoses with TIMI I flow. Chronically occluded SVG to D and SVG to OM. Unsuccessful PCI to SVG to PDA due to inability to cross the lesion with multiple wires ([employer], [employer], and [employer]) including with balloon support.  At the end of the aborted procedure, the proximal SVG to PDA is occluded with TIMI-0 flow (TIMI-1 flow prior to intervention).  [name] Heart Pressures RA (mean): 14 mmHg RV (S/EDP): 55/13 mmHg PA (S/D, mean): 53/25 mmHg PCWP (mean): 30 mmHg   Echocardiogram December 09, 2020  1. Left ventricular ejection fraction, by estimation, is 55%. The left  ventricle has normal function. The left ventricle has no regional wall  motion abnormalities. There is mild left ventricular hypertrophy. Left  ventricular diastolic parameters are  indeterminate.   2. [name] ventricular systolic function is normal. The [name] ventricular  size is normal.   3. The mitral valve is normal in structure. Mild mitral valve  regurgitation.   4. The aortic valve is  tricuspid. Aortic valve regurgitation is mild.  Mild to moderate aortic valve sclerosis/calcification is present, without  any evidence of aortic stenosis.   5. The inferior vena cava is dilated in size with <50% respiratory  variability, suggesting [name] atrial pressure of 15 mmHg.  In follow-up today again reports shortness of breath, cannot do anything, feels like he is up to his neck with fluid Reports he is not on diuretic, this was held at discharge October 50,022 presumably for bump in his creatinine Abdomen feels tight, has PND orthopnea  Of past medical history reviewed 05/2020: in hospital , [facility] records reviewed  COVID April 2022 treated with paxlovid  Prior echocardiogram in heart catheterization November 2019 Moderate pulmonary hypertension, Severe three-vessel disease Occluded proximal RCA, occluded ostial LAD LIMA graft to the LAD patent, vein graft to the PDA patent The findings this catheterization shows occluded vein graft to the OM 3, now occluded proximal native RCA  In the hospital February 2022 [name] upper quadrant abdominal pain with associated nausea and vomiting as well as fever with a T-max of one 1.5 for the last 3 to 4 days with associated generalized fatigue and tiredness.   cholestasis with elevated alkaline phosphatase and direct hyperbilirubinemia GGT elevated more than 200 which is consistent with hepatic origin of elevated alkaline phosphatase Acute hepatitis panel negative   Past Medical History:  Diagnosis Date   (HFpEF) heart failure with preserved ejection fraction ([place])    a. 04/1973 Echo: nl systolic fxn, mild LVH, diastolic relaxation abnormality, mildly enlarged LA, mild Ao insufficiency; 01/2018 Echo: EF 55-60%, no rwma, GR2 DD, triv AI, mildly dil LA, mild inc PASP.   BPH (benign prostatic hyperplasia)    Cancer (HCC)    skin   Cataract    Choledocholithiasis    a. 05/2020 ERCP w/ CBD stone removal and biliary sphincterotomy.    Coronary artery disease    a. 01/2005 s/p CABG x 4: LIMA-LAD, VG-Diag, VG-OM3, VG-dRCA; b. 06/2013 Cath: patent grafts; c. 09/2015 Cath: VG->Diag 100, other grafts patent. LAD 100, RCA sev dzs->Med rx; d. 01/2018 Cath: LAD 100ost/p, LCX 50, RCA 100p/d, VG->OM3 100, VG->Diag 100, LIMA->LAD ok, VG->RPAV 31m-->Med rx.   COVID-19    07/2020 had paxlovid    Dyspnea    with exertion   Dysrhythmia    ED (erectile dysfunction)    Elevated LFTs    a. 05/2020 following CBD stone. 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   GERD (gastroesophageal reflux disease)    Hyperlipidemia    Hypertension    IDDM (insulin dependent diabetes mellitus) 2000   Osteoarthritis    PAH (pulmonary artery hypertension) ([place])    a. 01/2018 RHC: PA 51/22 (32).   Perirectal fistula    Pneumonia 12/2016   Shingles 09/04/2018   Tubular adenoma of colon 07/2012   Vertigo    Past Surgical History:  Procedure Laterality Date   BACK SURGERY     CARDIAC CATHETERIZATION  06/05/2013   [facility]    CARDIAC CATHETERIZATION N/A 09/24/2015   Procedure: [name] Heart Cath  and Coronary/Graft Angiography;  Surgeon: Minna Merritts, MD;  Location: [place] [facility];  Service: Cardiovascular;  Laterality: N/A;   CATARACT EXTRACTION  sept 2013   [name]   CATARACT EXTRACTION W/PHACO Left 03/28/2017   Procedure: CATARACT EXTRACTION PHACO AND INTRAOCULAR LENS PLACEMENT (IOC);  Surgeon: Birder Robson, MD;  Location: [facility] ORS;  Service: Ophthalmology;  Laterality: Left;  [place] 00:42.0 AP% 15.0 CDE 6.31 Fluid Pack lot # [id number]   CHOLECYSTECTOMY  05/15/2011   Procedure: LAPAROSCOPIC CHOLECYSTECTOMY;  Surgeon: Rolm Bookbinder, MD;  Location: [facility];  Service: General;  Laterality: N/A;   COLONOSCOPY W/ POLYPECTOMY     CORONARY ARTERY BYPASS GRAFT  2007   x 4   CORONARY STENT INTERVENTION N/A 12/08/2020   Procedure: CORONARY STENT INTERVENTION;  Surgeon: Nelva Bush, MD;  Location: [place] [facility];  Service: Cardiovascular;  Laterality: N/A;   ERCP N/A 05/05/2020   Procedure: ENDOSCOPIC RETROGRADE CHOLANGIOPANCREATOGRAPHY (ERCP);  Surgeon: Lucilla Lame, MD;  Location: [facility] ENDOSCOPY;  Service: Endoscopy;  Laterality: N/A;   ERCP N/A 05/12/2020   Procedure: ENDOSCOPIC RETROGRADE CHOLANGIOPANCREATOGRAPHY (ERCP);  Surgeon: Lucilla Lame, MD;  Location: [facility] ENDOSCOPY;  Service: Endoscopy;  Laterality: N/A;   ESOPHAGOGASTRODUODENOSCOPY N/A 05/12/2020   Procedure: ESOPHAGOGASTRODUODENOSCOPY (EGD);  Surgeon: Lucilla Lame, MD;  Location: [facility] ENDOSCOPY;  Service: Endoscopy;  Laterality: N/A;   EYE SURGERY     FOOT SURGERY  2012   [name] foot   JOINT REPLACEMENT  8/10   [name] THR--[name]   LEFT AND [name] HEART CATHETERIZATION WITH CORONARY/GRAFT ANGIOGRAM N/A 06/05/2013   Procedure: LEFT AND [name] HEART CATHETERIZATION WITH Beatrix Fetters;  Surgeon: Peter M Martinique, MD;  Location: [facility] CATH LAB;  Service: Cardiovascular;  Laterality: N/A;   LUMBAR LAMINECTOMY  1989   Prostate photovaporization  5/16   Dr Budd Palmer   [name]/LEFT HEART CATH AND CORONARY/GRAFT ANGIOGRAPHY N/A 01/05/2018   Procedure: [name]/LEFT HEART CATH AND CORONARY/GRAFT ANGIOGRAPHY;  Surgeon: Minna Merritts, MD;  Location: [place] [facility];  Service: Cardiovascular;  Laterality: N/A;   [name]/LEFT HEART CATH AND CORONARY/GRAFT ANGIOGRAPHY Bilateral 12/08/2020   Procedure: [name]/LEFT HEART CATH AND CORONARY/GRAFT ANGIOGRAPHY;  Surgeon: Nelva Bush, MD;  Location: [place] [facility];  Service: Cardiovascular;  Laterality: Bilateral;     Current Outpatient Medications on File Prior to Visit  Medication Sig Dispense Refill   aspirin EC 81 MG tablet Take 81 mg by mouth every other day. In the morning     BD PEN NEEDLE NANO U/F 32G X 4 MM MISC USE AS DIRECTED 100 each 0   carvedilol (COREG) 12.5 MG tablet TAKE ONE (1) TABLET BY MOUTH TWO TIMES PER DAY 180 tablet 0   Cholecalciferol (VITAMIN D3) 50  MCG (2000 UT) TABS Take 2,000 Units by mouth in the morning.     doxazosin (CARDURA) 2 MG tablet Take 1 tablet (2 mg total) by mouth 2 (two) times daily. 60 tablet 6   ezetimibe (ZETIA) 10 MG tablet Take 1 tablet (10 mg total) by mouth daily. 90 tablet 3   finasteride (PROSCAR) 5 MG tablet Take 1 tablet (5 mg total) by mouth daily. (Patient taking differently: Take 5 mg by mouth every evening.) 90 tablet 1   gabapentin (NEURONTIN) 100 MG capsule TAKE 1 CAPSULE BY MOUTH 3 TIMES DAILY 90 capsule 1   hydrALAZINE (APRESOLINE) 25 MG tablet Take 25 mg by mouth in the morning and at bedtime.     HYDROcodone-acetaminophen (NORCO) 5-325 MG tablet Take 1 tablet by  mouth 3 (three) times daily as needed for moderate pain. (Patient taking differently: Take 1 tablet by mouth daily as needed for moderate pain (severe back pain.).) 90 tablet 0   isosorbide mononitrate (IMDUR) 30 MG 24 hr tablet Take 1 tablet (30 mg total) by mouth daily. 30 tablet 6   loperamide (IMODIUM A-D) 2 MG tablet Take 1 tablet (2 mg total) by mouth 2 (two) times daily as needed for up to 60 doses for diarrhea or loose stools. (Patient taking differently: Take 2 mg by mouth in the morning.) 60 tablet 0   Multiple Vitamins-Minerals (PRESERVISION AREDS 2 PO) Take 1 tablet by mouth in the morning and at bedtime.     omeprazole (PRILOSEC) 20 MG capsule TAKE 1 CAPSULE BY MOUTH TWICE DAILY 60 capsule 1   potassium chloride (KLOR-CON) 10 MEQ tablet Take 1 tablet (10 mEq total) by mouth daily as needed. Take with torsemide 90 tablet 3   Probiotic Product (PROBIOTIC-10 PO) Take 1 capsule by mouth in the morning.     torsemide (DEMADEX) 20 MG tablet For weight 210 to 215 take torsemide 20 mg with potassium 10 meq. For weight > Torsemide 20 mg twice a day  (8 am and 2 pm) With potassium 20 meq 200 tablet 3   TOUJEO SOLOSTAR 300 UNIT/ML Solostar Pen INJECT 50 UNITS DAILY MAY INCREASE 2U EVERY 3 DAYS UNTIL FBG IS AT GOAL UP TO 80 UNITS DAILY (Patient taking  differently: Inject 41 Units into the skin in the morning.) 22.5 mL 6   valsartan (DIOVAN) 320 MG tablet Take 1 tablet (320 mg total) by mouth daily. 90 tablet 3   No current facility-administered medications on file prior to visit.     Allergies:   Cortisone and Metformin   Social History   Tobacco Use   Smoking status: Never   Smokeless tobacco: Never  Vaping Use   Vaping Use: Never used  Substance Use Topics   Alcohol use: Yes    Comment: occ cocktail   Drug use: No     Family Hx: The patient's family history includes Heart disease in his father; Lung cancer in his mother. There is no history of Colon cancer or Breast cancer.  ROS:   Please see the history of present illness.    Review of Systems  Constitutional: Negative.   HENT: Negative.    Respiratory:  Positive for shortness of breath.   Cardiovascular:  Positive for leg swelling.  Gastrointestinal: Negative.   Musculoskeletal: Negative.   Neurological: Negative.   Psychiatric/Behavioral: Negative.    All other systems reviewed and are negative.   Labs/Other Tests and Data Reviewed:    Recent Labs: 05/15/2020: Magnesium 1.8 11/12/2020: ALT 12 12/09/2020: Hemoglobin 11.5; Platelets 104 01/07/2021: BUN 24; Creatinine, Ser 1.70; Potassium 4.4; Sodium 142   Recent Lipid Panel Lab Results  Component Value Date/Time   CHOL 93 03/11/2020 03:25 PM   CHOL 125 12/27/2017 02:46 PM   TRIG 163.0 (H) 03/11/2020 03:25 PM   TRIG 289 (H) 12/27/2017 02:46 PM   HDL 37.60 (L) 03/11/2020 03:25 PM   HDL 39 (L) 07/04/2014 08:01 AM   CHOLHDL 2 03/11/2020 03:25 PM   LDLCALC 23 03/11/2020 03:25 PM   LDLCALC 110 (H) 07/04/2014 08:01 AM   LDLDIRECT 65.0 06/02/2016 02:17 PM    Wt Readings from Last 3 Encounters:  01/26/21 221 lb 8 oz (100.5 kg)  12/22/20 226 lb 4 oz (102.6 kg)  12/08/20 218 lb (98.9 kg)  Exam:    BP (!) 138/56 (BP Location: Left Arm, Patient Position: Sitting, Cuff Size: Normal)   Pulse 61   Ht 6'  (1.829 m)   Wt 221 lb 8 oz (100.5 kg)   SpO2 99%   BMI 30.04 kg/m   Constitutional:  oriented to person, place, and time. No distress.  HENT:  Head: Grossly normal Eyes:  no discharge. No scleral icterus.  Neck: No JVD, no carotid bruits  Cardiovascular: Regular rate and rhythm, no murmurs appreciated Pulmonary/Chest: Clear to auscultation bilaterally, no wheezes or rails Abdominal: Soft.  no distension.  no tenderness.  Musculoskeletal: Normal range of motion Neurological:  normal muscle tone. Coordination normal. No atrophy Skin: Skin warm and dry Psychiatric: normal affect, pleasant  ASSESSMENT & PLAN:    Coronary artery disease of native artery of native heart with stable angina pectoris (HCC) Severe native coronary disease, 3 of 4 bypass grafts occluded occlusion of graft to the RCA, unable to intervene Markedly elevated [name] heart pressures -BMP ordered today -Recommend torsemide 20 mg with potassium 10 for weight between 210 and 215 pounds -Recommend torsemide 20 mg twice a day with potassium 20 for weight over 215 pounds Weight is down 5 pounds from 1 month ago on the regimen above with improvement of his shortness of breath though still has symptoms Wife has noticed a improvement, less respiratory distress -Reports still missing doses of torsemide here and there  Chronic stable angina, Cholesterol at goal Stressed importance of closely monitoring weight, and attention to taking his medications, aggressive blood pressure control  Pulmonary hypertension Previous PFTs, obstructive disease,  Chronic  shortness of breath  Recent worsening has improved with 5 pound weight loss on torsemide regimen as above  Chronic diastolic CHF (congestive heart failure) (HCC) Torsemide regimen as above Did not tolerate Farxiga  Type 2 diabetes mellitus with complication, without long-term current use of insulin (HCC) Weight loss recommended, walking program Does eat out at  restaurants  Mixed hyperlipidemia Cholesterol is at goal on the current lipid regimen. No changes to the medications were made.  Essential hypertension Recommended he review our medication list closely to confirm he is on medications as listed Did not bring medications or list with him, some medication confusion  S/P CABG x 4 Prior catheterization in the 2019  Catheterization results discussed with him, there is no plan for surgery, no options for stenting  CKD (chronic kidney disease) stage 3, GFR 30-59 ml/min (HCC) BMP ordered   Total encounter time more than 35 minutes  Greater than 50% was spent in counseling and coordination of care with the patient    Signed, Ida Rogue, MD  01/26/2021 3:38 PM    [place] Office [address], [place], [place] [address]

## 2021-01-26 NOTE — Patient Instructions (Addendum)
Cardiac rehab will call for an appt, they are currently back up, our apologies.   Medication Instructions:  Please RESTART  crestor 20 mg daily  For weight 210 to 215 (current weight 221) take torsemide 20 mg  with potassium 10 meq  For weight > 215 Take torsemide 20 mg twice a day   (8 am and 2 pm)  with potassium 20 meq  If you need a refill on your cardiac medications before your next appointment, please call your pharmacy.    Lab work: BMP today Lipids in 3 months (needs scheduling)  Testing/Procedures: No new testing needed  Follow-Up: At [facility], you and your health needs are our priority.  As part of our continuing mission to provide you with exceptional heart care, we have created designated Provider Care Teams.  These Care Teams include your primary Cardiologist (physician) and Advanced Practice Providers (APPs -  Physician Assistants and Nurse Practitioners) who all work together to provide you with the care you need, when you need it.  You will need a follow up appointment in 6 months  Providers on your designated Care Team:   [name], NP Christell Faith, PA-C Cadence Kathlen Mody, [place]  COVID-19 Vaccine Information can be found at: [email or web address].uk For questions related to vaccine distribution or appointments, please email [email or web address] or call [phone].

## 2021-01-27 ENCOUNTER — Telehealth: Payer: Self-pay

## 2021-01-27 LAB — BASIC METABOLIC PANEL
BUN/Creatinine Ratio: 19 (ref 10–24)
BUN: 30 mg/dL — ABNORMAL HIGH (ref 8–27)
CO2: 22 mmol/L (ref 20–29)
Calcium: 8.8 mg/dL (ref 8.6–10.2)
Chloride: 108 mmol/L — ABNORMAL HIGH (ref 96–106)
Creatinine, Ser: 1.58 mg/dL — ABNORMAL HIGH (ref 0.76–1.27)
Glucose: 193 mg/dL — ABNORMAL HIGH (ref 70–99)
Potassium: 4.5 mmol/L (ref 3.5–5.2)
Sodium: 142 mmol/L (ref 134–144)
eGFR: 43 mL/min/{1.73_m2} — ABNORMAL LOW (ref >59)

## 2021-01-27 NOTE — Telephone Encounter (Signed)
Left message on patients voicemail to please return our call.   

## 2021-01-27 NOTE — Telephone Encounter (Signed)
Spoke with patient regarding results and recommendation.  Patient verbalizes understanding and is agreeable to plan of care. Advised patient to call back with any issues or concerns.  

## 2021-01-27 NOTE — Telephone Encounter (Signed)
-----   Message from Theora Gianotti, NP sent at 01/27/2021  7:55 AM EST ----- Kidney function and electrolytes are stable.

## 2021-02-01 ENCOUNTER — Other Ambulatory Visit: Payer: Self-pay

## 2021-02-01 ENCOUNTER — Ambulatory Visit
Admission: RE | Admit: 2021-02-01 | Discharge: 2021-02-01 | Disposition: A | Discharge status: Home / Self Care | Payer: Medicare Other | Source: Ambulatory Visit | Attending: Emergency Medicine | Admitting: Emergency Medicine

## 2021-02-01 VITALS — BP 133/63 | HR 66 | Temp 98.0°F | Resp 16

## 2021-02-01 DIAGNOSIS — S29019A Strain of muscle and tendon of unspecified wall of thorax, initial encounter: Secondary | ICD-10-CM

## 2021-02-01 DIAGNOSIS — M5441 Lumbago with sciatica, right side: Secondary | ICD-10-CM | POA: Diagnosis not present

## 2021-02-01 MED ORDER — DICLOFENAC SODIUM 1 % EX GEL: 2.0000 g | Freq: Four times a day (QID) | CUTANEOUS | 1 refills | Status: DC

## 2021-02-01 MED ORDER — BACLOFEN 10 MG PO TABS: 10.0000 mg | ORAL_TABLET | Freq: Three times a day (TID) | ORAL | 0 refills | Status: DC

## 2021-02-01 NOTE — ED Provider Notes (Signed)
[facility]    CSN: [id number] Arrival date & time: 02/01/21  1330      History   Chief Complaint Chief Complaint  Patient presents with   Shoulder Pain   Back Pain    HPI [name] [name] is a 87 y.o. male.   HPI  87 year old male here for evaluation of musculoskeletal issues.  Patient reports that he has been experiencing pain in his right shoulder for the last 6 months and then developed pain in his right-sided low back 1 week ago.  He denies any injury or falls.  He states he has a longstanding history of arthritis.  He has been using over-the-counter Tylenol 650 mg every 6 hours with minimal relief of pain.  He states that the pain in his right side of his low back goes down through his buttock into his right leg down to the level of his knee.  He states that the pain increases with walking or standing.  He denies any numbness or tingling in his right hand or his right foot.  Past Medical History:  Diagnosis Date   (HFpEF) heart failure with preserved ejection fraction ([place])    a. 06/979 Echo: nl systolic fxn, mild LVH, diastolic relaxation abnormality, mildly enlarged LA, mild Ao insufficiency; 01/2018 Echo: EF 55-60%, no rwma, GR2 DD, triv AI, mildly dil LA, mild inc PASP.   BPH (benign prostatic hyperplasia)    Cancer (HCC)    skin   Cataract    Choledocholithiasis    a. 05/2020 ERCP w/ CBD stone removal and biliary sphincterotomy.   Coronary artery disease    a. 01/2005 s/p CABG x 4: LIMA-LAD, VG-Diag, VG-OM3, VG-dRCA; b. 06/2013 Cath: patent grafts; c. 09/2015 Cath: VG->Diag 100, other grafts patent. LAD 100, RCA sev dzs->Med rx; d. 01/2018 Cath: LAD 100ost/p, LCX 50, RCA 100p/d, VG->OM3 100, VG->Diag 100, LIMA->LAD ok, VG->RPAV 85m-->Med rx.   COVID-19    07/2020 had paxlovid    Dyspnea    with exertion   Dysrhythmia    ED (erectile dysfunction)    Elevated LFTs    a. 05/2020 following CBD stone. Liver biopsy consistent w/ changes related to CBD  obstruction and not chronic process.   GERD (gastroesophageal reflux disease)    Hyperlipidemia    Hypertension    IDDM (insulin dependent diabetes mellitus) 2000   Osteoarthritis    PAH (pulmonary artery hypertension) ([place])    a. 01/2018 RHC: PA 51/22 (32).   Perirectal fistula    Pneumonia 12/2016   Shingles 09/04/2018   Tubular adenoma of colon 07/2012   Vertigo     Patient Active Problem List   Diagnosis Date Noted   Acute on chronic heart failure with preserved ejection fraction (HFpEF) ([place]) 12/09/2020   C. difficile diarrhea 10/05/2020   De Quervain's tenosynovitis, left 10/05/2020   Nail, injury by 08/27/2020   Abnormal MRI, lumbar spine 08/12/2020   History of COVID-19 07/22/2020   Melena 05/11/2020   Jaundice 05/11/2020   Choledocholithiasis    AKI (acute kidney injury) ([place]) 04/08/2020   Suspected COVID-19 virus infection 04/06/2020   Candidal intertrigo 03/12/2020   Scrotal lesion 03/12/2020   Cellulitis 05/12/2019   Fibula fracture 05/09/2019   Epigastric pain 05/09/2019   Postherpetic neuralgia 05/09/2019   B12 deficiency 05/09/2019   Pulmonary hypertension, unspecified ([place]) 04/05/2019   Proteinuria, unspecified 04/05/2019   Atypical chest pain 01/08/2019   Weight loss 12/01/2018   Vertigo 10/15/2018   Diabetic retinopathy ([place]) 09/20/2018  Bile duct abnormality 09/12/2018   Stable angina ([place]) 03/12/2018   Chronic low back pain 03/05/2018   Skin tear of hand without complication, initial encounter 03/05/2018   Abnormal breast exam 01/18/2018   PAC (premature atrial contraction) 01/18/2018   Neuropathic pain 01/18/2018   Demand ischemia ([place])    Acute on chronic kidney failure ([place]) 01/07/2018   Bell's palsy 08/18/2017   H/O cold sores 08/18/2017   Irregular heart beat 11/29/2016   Insomnia 11/10/2016   PLMD (periodic limb movement disorder) 08/17/2016   Mild OSA 08/17/2016   Intolerance of continuous positive airway pressure (CPAP) ventilation  08/17/2016   Chronic arthritis 06/02/2016   CKD (chronic kidney disease) stage 3, GFR 30-59 ml/min (HCC) 44/03/270   Diastolic dysfunction 53/66/4403   Osteoarthritis 05/04/2016   Presence of aortocoronary bypass graft 05/04/2016   Chronic pain of both knees 10/06/2015   Sensory abnormality of thoracic dermatome distribution 10/06/2015   Spondylosis of lumbar region without myelopathy or radiculopathy 10/06/2015   Unstable angina ([place]) 09/24/2015   S/P CABG x 4    Shortness of breath 05/14/2015   Thrombocytopenia ([place]) 02/07/2015   Type 2 diabetes mellitus with complication, without long-term current use of insulin ([place]) 02/06/2015   Chronic diastolic CHF (congestive heart failure) ([place]) 01/01/2014   CAD (coronary artery disease) 06/03/2013   Diabetic neuropathy ([place]) 11/12/2012   BPH (benign prostatic hyperplasia)    GERD (gastroesophageal reflux disease)    Hyperlipidemia 09/15/2009   Essential hypertension 09/15/2009    Past Surgical History:  Procedure Laterality Date   BACK SURGERY     CARDIAC CATHETERIZATION  06/05/2013   [facility]    CARDIAC CATHETERIZATION N/A 09/24/2015   Procedure: Right Heart Cath and Coronary/Graft Angiography;  Surgeon: Minna Merritts, MD;  Location: [facility];  Service: Cardiovascular;  Laterality: N/A;   CATARACT EXTRACTION  sept 2013   right   CATARACT EXTRACTION W/PHACO Left 03/28/2017   Procedure: CATARACT EXTRACTION PHACO AND INTRAOCULAR LENS PLACEMENT (IOC);  Surgeon: Birder Robson, MD;  Location: [facility];  Service: Ophthalmology;  Laterality: Left;  [place] 00:42.0 AP% 15.0 CDE 6.31 Fluid Pack lot # [id number]   CHOLECYSTECTOMY  05/15/2011   Procedure: LAPAROSCOPIC CHOLECYSTECTOMY;  Surgeon: Rolm Bookbinder, MD;  Location: [facility];  Service: General;  Laterality: N/A;   COLONOSCOPY W/ POLYPECTOMY     CORONARY ARTERY BYPASS GRAFT  2007   x 4   CORONARY STENT INTERVENTION N/A 12/08/2020   Procedure: CORONARY STENT INTERVENTION;   Surgeon: Nelva Bush, MD;  Location: [facility];  Service: Cardiovascular;  Laterality: N/A;   ERCP N/A 05/05/2020   Procedure: ENDOSCOPIC RETROGRADE CHOLANGIOPANCREATOGRAPHY (ERCP);  Surgeon: Lucilla Lame, MD;  Location: [facility] ENDOSCOPY;  Service: Endoscopy;  Laterality: N/A;   ERCP N/A 05/12/2020   Procedure: ENDOSCOPIC RETROGRADE CHOLANGIOPANCREATOGRAPHY (ERCP);  Surgeon: Lucilla Lame, MD;  Location: [facility] ENDOSCOPY;  Service: Endoscopy;  Laterality: N/A;   ESOPHAGOGASTRODUODENOSCOPY N/A 05/12/2020   Procedure: ESOPHAGOGASTRODUODENOSCOPY (EGD);  Surgeon: Lucilla Lame, MD;  Location: [facility] ENDOSCOPY;  Service: Endoscopy;  Laterality: N/A;   EYE SURGERY     FOOT SURGERY  2012   right foot   JOINT REPLACEMENT  8/10   Right THR--[name]   LEFT AND RIGHT HEART CATHETERIZATION WITH CORONARY/GRAFT ANGIOGRAM N/A 06/05/2013   Procedure: LEFT AND RIGHT HEART CATHETERIZATION WITH Beatrix Fetters;  Surgeon: Peter M Martinique, MD;  Location: [facility] CATH LAB;  Service: Cardiovascular;  Laterality: N/A;   LUMBAR LAMINECTOMY  1989   Prostate  photovaporization  5/16   Dr Budd Palmer   RIGHT/LEFT HEART CATH AND CORONARY/GRAFT ANGIOGRAPHY N/A 01/05/2018   Procedure: RIGHT/LEFT HEART CATH AND CORONARY/GRAFT ANGIOGRAPHY;  Surgeon: Minna Merritts, MD;  Location: [facility];  Service: Cardiovascular;  Laterality: N/A;   RIGHT/LEFT HEART CATH AND CORONARY/GRAFT ANGIOGRAPHY Bilateral 12/08/2020   Procedure: RIGHT/LEFT HEART CATH AND CORONARY/GRAFT ANGIOGRAPHY;  Surgeon: Nelva Bush, MD;  Location: [facility];  Service: Cardiovascular;  Laterality: Bilateral;       Home Medications    Prior to Admission medications   Medication Sig Start Date End Date Taking? Authorizing Provider  baclofen (LIORESAL) 10 MG tablet Take 1 tablet (10 mg total) by mouth 3 (three) times daily. 02/01/21  Yes Margarette Canada, NP  diclofenac Sodium (VOLTAREN) 1 % GEL Apply 2 g topically 4 (four) times  daily. 02/01/21  Yes Margarette Canada, NP  aspirin EC 81 MG tablet Take 81 mg by mouth every other day. In the morning    [provider]  BD PEN NEEDLE NANO U/F 32G X 4 MM MISC USE AS DIRECTED 10/27/20   [name], MD  carvedilol (COREG) 12.5 MG tablet TAKE ONE (1) TABLET BY MOUTH TWO TIMES PER DAY 10/07/20   Minna Merritts, MD  Cholecalciferol (VITAMIN D3) 50 MCG (2000 UT) TABS Take 2,000 Units by mouth in the morning.    [provider]  doxazosin (CARDURA) 2 MG tablet Take 1 tablet (2 mg total) by mouth 2 (two) times daily. 09/02/20   Minna Merritts, MD  ezetimibe (ZETIA) 10 MG tablet Take 1 tablet (10 mg total) by mouth daily. 01/11/21 04/11/21  Minna Merritts, MD  finasteride (PROSCAR) 5 MG tablet Take 1 tablet (5 mg total) by mouth daily. Patient taking differently: Take 5 mg by mouth every evening. 03/11/20   [name], MD  gabapentin (NEURONTIN) 100 MG capsule TAKE 1 CAPSULE BY MOUTH 3 TIMES DAILY 12/04/20   [name], MD  hydrALAZINE (APRESOLINE) 25 MG tablet Take 25 mg by mouth in the morning and at bedtime. 01/05/21   [provider]  HYDROcodone-acetaminophen (NORCO) 5-325 MG tablet Take 1 tablet by mouth 3 (three) times daily as needed for moderate pain. Patient taking differently: Take 1 tablet by mouth daily as needed for moderate pain (severe back pain.). 08/12/20   McLean-Scocuzza, Nino Glow, MD  isosorbide mononitrate (IMDUR) 30 MG 24 hr tablet Take 1 tablet (30 mg total) by mouth daily. 12/10/20   Theora Gianotti, NP  loperamide (IMODIUM A-D) 2 MG tablet Take 1 tablet (2 mg total) by mouth 2 (two) times daily as needed for up to 60 doses for diarrhea or loose stools. Patient taking differently: Take 2 mg by mouth in the morning. 11/13/20   Virgel Manifold, MD  Multiple Vitamins-Minerals (PRESERVISION AREDS 2 PO) Take 1 tablet by mouth in the morning and at bedtime.    [provider]  omeprazole (PRILOSEC) 20 MG  capsule TAKE 1 CAPSULE BY MOUTH TWICE DAILY 01/14/21   [name], MD  potassium chloride (KLOR-CON) 10 MEQ tablet Take 1 tablet (10 mEq total) by mouth daily as needed. Take with torsemide 12/22/20   Minna Merritts, MD  Probiotic Product (PROBIOTIC-10 PO) Take 1 capsule by mouth in the morning.    [provider]  rosuvastatin (CRESTOR) 20 MG tablet Take 1 tablet (20 mg total) by mouth daily. 01/26/21   Minna Merritts, MD  torsemide [facility])  20 MG tablet For weight 210 to 215 take torsemide 20 mg with potassium 10 meq. For weight > Torsemide 20 mg twice a day  (8 am and 2 pm) With potassium 20 meq 12/22/20   Gollan, Kathlene November, MD  TOUJEO SOLOSTAR 300 UNIT/ML Solostar Pen INJECT 50 UNITS DAILY MAY INCREASE 2U EVERY 3 DAYS UNTIL FBG IS AT GOAL UP TO 80 UNITS DAILY Patient taking differently: Inject 41 Units into the skin in the morning. 06/22/20   [name], MD  valsartan (DIOVAN) 320 MG tablet Take 1 tablet (320 mg total) by mouth daily. 10/23/20   Theora Gianotti, NP    Family History Family History  Problem Relation Age of Onset   Lung cancer Mother    Heart disease Father    Colon cancer Neg Hx    Breast cancer Neg Hx     Social History Social History   Tobacco Use   Smoking status: Never   Smokeless tobacco: Never  Vaping Use   Vaping Use: Never used  Substance Use Topics   Alcohol use: Yes    Comment: occ cocktail   Drug use: No     Allergies   Cortisone and Metformin   Review of Systems Review of Systems  Constitutional:  Negative for activity change, appetite change and fever.  Musculoskeletal:  Positive for arthralgias, back pain and myalgias.  Skin: Negative.   Neurological:  Negative for weakness and numbness.  Psychiatric/Behavioral: Negative.      Physical Exam Triage Vital Signs ED Triage Vitals  Enc Vitals Group     BP 02/01/21 1402 133/63     Pulse Rate 02/01/21 1402 66     Resp 02/01/21 1402 16     Temp  02/01/21 1402 98 F (36.7 C)     Temp Source 02/01/21 1402 Oral     SpO2 02/01/21 1402 99 %     Weight --      Height --      Head Circumference --      Peak Flow --      Pain Score 02/01/21 1401 8     Pain Loc --      Pain Edu? --      Excl. in [place]? --    No data found.  Updated Vital Signs BP 133/63 (BP Location: Left Arm)   Pulse 66   Temp 98 F (36.7 C) (Oral)   Resp 16   SpO2 99%   Visual Acuity Right Eye Distance:   Left Eye Distance:   Bilateral Distance:    Right Eye Near:   Left Eye Near:    Bilateral Near:     Physical Exam Vitals and nursing note reviewed.  Constitutional:      General: He is not in acute distress.    Appearance: Normal appearance. He is normal weight. He is not ill-appearing.  Musculoskeletal:        General: Tenderness present. No swelling or deformity. Normal range of motion.  Skin:    General: Skin is warm and dry.     Capillary Refill: Capillary refill takes less than 2 seconds.     Findings: No erythema.  Neurological:     General: No focal deficit present.     Mental Status: He is alert and oriented to person, place, and time.  Psychiatric:        Mood and Affect: Mood normal.        Behavior: Behavior normal.  Thought Content: Thought content normal.        Judgment: Judgment normal.     UC Treatments / Results  Labs (all labs ordered are listed, but only abnormal results are displayed) Labs Reviewed - No data to display  EKG   Radiology No results found.  Procedures Procedures (including critical care time)  Medications Ordered in UC Medications - No data to display  Initial Impression / Assessment and Plan / UC Course  I have reviewed the triage vital signs and the nursing notes.  Pertinent labs & imaging results that were available during my care of the patient were reviewed by me and considered in my medical decision making (see chart for details).  Patient is a nontoxic-appearing 87 year old male  here for evaluation of 6 months worth of right shoulder pain which is more up in his trapezius area and is worse at night.  He is also here for evaluation of 1 weeks worth of right-sided low back pain that radiates down his right leg to the level of his knee.  He denies any injury further 1.  He states that a week ago he was sitting at his desk and when he went to get up he had pain in his back and it almost caused him to fall.  Since then he has been experiencing pain that if he stands for too long or when transitioning after sitting for long period he gets a catch in his back that affects his gait.  He has been using over-the-counter Tylenol without any significant relief of pain.  On physical exam patient's right arm is in normal anatomical alignment.  There is no tenderness with palpation of the glenohumeral joint.  Patient does have tenderness with palpation of the trapezius muscle and he has mild to moderate spasm in the body of the trapezius.  Patient similarly has spasm in his right lumbar paraspinous region.  The spasm extends down into the buttock and when palpating along the path of the sciatic nerve duplicates the pain that he has been feeling.  Patient's extremity strength is 5/5 bilaterally and his grip is 5/5 in his right hand.  Patient has full range of motion of his upper and lower extremities.  Suspect patient's pain is musculoskeletal in nature as he has full range of motion of his right shoulder, and right hip.  Due to his history of heart disease, diabetes, kidney disease systemic anti-inflammatories or steroids are contraindicated.  We will do a trial of topical Voltaren gel as there is very little systemic absorption to see if this helps with his discomfort.  I will also place patient on baclofen to help with the muscle spasm 3 times daily as needed.  Patient vies that if symptoms do not improve that he should follow-up with orthopedics as he may need a targeted steroid injection in either his  low back or his mid back to help with his discomfort.   Final Clinical Impressions(s) / UC Diagnoses   Final diagnoses:  Thoracic myofascial strain, initial encounter  Acute right-sided low back pain with right-sided sciatica     Discharge Instructions      Take the Baclofen 10 mg every 8 hours as needed for muscle spasm.  Apply the Voltaren gel every 6 hours as needed for inflammation and pain.  You may continue to use OTC Tylenol 650 mg every 6 hours as needed for pain.     ED Prescriptions     Medication Sig Dispense Auth. Provider  baclofen (LIORESAL) 10 MG tablet Take 1 tablet (10 mg total) by mouth 3 (three) times daily. 30 each Margarette Canada, NP   diclofenac Sodium (VOLTAREN) 1 % GEL Apply 2 g topically 4 (four) times daily. 100 g Margarette Canada, NP      PDMP not reviewed this encounter.   Margarette Canada, NP 02/01/21 [phone]

## 2021-02-01 NOTE — Discharge Instructions (Signed)
Take the Baclofen 10 mg every 8 hours as needed for muscle spasm.  Apply the Voltaren gel every 6 hours as needed for inflammation and pain.  You may continue to use OTC Tylenol 650 mg every 6 hours as needed for pain.

## 2021-02-01 NOTE — ED Triage Notes (Signed)
Patient presents to Urgent Care with complaints of shoulder pain x 6 months and back pain since last weds. Treating symptoms with 650 mg Tylenol. Has a hx of arthritis. States back pain worsens with standing and ambulation.

## 2021-02-03 ENCOUNTER — Encounter: Payer: Self-pay | Admitting: Emergency Medicine

## 2021-02-03 ENCOUNTER — Emergency Department: Payer: Medicare Other

## 2021-02-03 ENCOUNTER — Observation Stay
Admission: EM | Admit: 2021-02-03 | Discharge: 2021-02-03 | Disposition: A | Discharge status: Home / Self Care | Payer: Medicare Other | Attending: Internal Medicine | Admitting: Internal Medicine

## 2021-02-03 ENCOUNTER — Other Ambulatory Visit: Payer: Self-pay

## 2021-02-03 DIAGNOSIS — E1122 Type 2 diabetes mellitus with diabetic chronic kidney disease: Secondary | ICD-10-CM | POA: Diagnosis present

## 2021-02-03 DIAGNOSIS — Z79899 Other long term (current) drug therapy: Secondary | ICD-10-CM | POA: Diagnosis not present

## 2021-02-03 DIAGNOSIS — N183 Chronic kidney disease, stage 3 unspecified: Secondary | ICD-10-CM | POA: Diagnosis not present

## 2021-02-03 DIAGNOSIS — M6281 Muscle weakness (generalized): Secondary | ICD-10-CM | POA: Insufficient documentation

## 2021-02-03 DIAGNOSIS — Z794 Long term (current) use of insulin: Secondary | ICD-10-CM | POA: Diagnosis not present

## 2021-02-03 DIAGNOSIS — I10 Essential (primary) hypertension: Secondary | ICD-10-CM | POA: Diagnosis present

## 2021-02-03 DIAGNOSIS — Z20822 Contact with and (suspected) exposure to covid-19: Secondary | ICD-10-CM | POA: Diagnosis not present

## 2021-02-03 DIAGNOSIS — Z8616 Personal history of COVID-19: Secondary | ICD-10-CM | POA: Diagnosis not present

## 2021-02-03 DIAGNOSIS — Z951 Presence of aortocoronary bypass graft: Secondary | ICD-10-CM | POA: Insufficient documentation

## 2021-02-03 DIAGNOSIS — I5033 Acute on chronic diastolic (congestive) heart failure: Secondary | ICD-10-CM | POA: Insufficient documentation

## 2021-02-03 DIAGNOSIS — Z955 Presence of coronary angioplasty implant and graft: Secondary | ICD-10-CM | POA: Diagnosis not present

## 2021-02-03 DIAGNOSIS — E1149 Type 2 diabetes mellitus with other diabetic neurological complication: Secondary | ICD-10-CM

## 2021-02-03 DIAGNOSIS — I2510 Atherosclerotic heart disease of native coronary artery without angina pectoris: Secondary | ICD-10-CM | POA: Diagnosis not present

## 2021-02-03 DIAGNOSIS — I5032 Chronic diastolic (congestive) heart failure: Secondary | ICD-10-CM | POA: Diagnosis present

## 2021-02-03 DIAGNOSIS — R4182 Altered mental status, unspecified: Secondary | ICD-10-CM

## 2021-02-03 DIAGNOSIS — R410 Disorientation, unspecified: Principal | ICD-10-CM | POA: Diagnosis present

## 2021-02-03 DIAGNOSIS — I700 Atherosclerosis of aorta: Secondary | ICD-10-CM | POA: Diagnosis not present

## 2021-02-03 DIAGNOSIS — Z85828 Personal history of other malignant neoplasm of skin: Secondary | ICD-10-CM | POA: Diagnosis not present

## 2021-02-03 DIAGNOSIS — R29818 Other symptoms and signs involving the nervous system: Secondary | ICD-10-CM | POA: Diagnosis not present

## 2021-02-03 DIAGNOSIS — R531 Weakness: Secondary | ICD-10-CM | POA: Diagnosis not present

## 2021-02-03 DIAGNOSIS — K219 Gastro-esophageal reflux disease without esophagitis: Secondary | ICD-10-CM | POA: Diagnosis present

## 2021-02-03 DIAGNOSIS — I130 Hypertensive heart and chronic kidney disease with heart failure and stage 1 through stage 4 chronic kidney disease, or unspecified chronic kidney disease: Secondary | ICD-10-CM | POA: Diagnosis not present

## 2021-02-03 DIAGNOSIS — Z7982 Long term (current) use of aspirin: Secondary | ICD-10-CM | POA: Diagnosis not present

## 2021-02-03 DIAGNOSIS — M25551 Pain in right hip: Secondary | ICD-10-CM | POA: Diagnosis not present

## 2021-02-03 DIAGNOSIS — N400 Enlarged prostate without lower urinary tract symptoms: Secondary | ICD-10-CM | POA: Diagnosis present

## 2021-02-03 HISTORY — DX: Altered mental status, unspecified: R41.82

## 2021-02-03 LAB — HEPATIC FUNCTION PANEL
ALT: 22 U/L (ref 0–44)
AST: 25 U/L (ref 15–41)
Albumin: 3.6 g/dL (ref 3.5–5.0)
Alkaline Phosphatase: 140 U/L — ABNORMAL HIGH (ref 38–126)
Bilirubin, Direct: 0.2 mg/dL (ref 0.0–0.2)
Indirect Bilirubin: 0.6 mg/dL (ref 0.3–0.9)
Total Bilirubin: 0.8 mg/dL (ref 0.3–1.2)
Total Protein: 6 g/dL — ABNORMAL LOW (ref 6.5–8.1)

## 2021-02-03 LAB — CBC
HCT: 36.7 % — ABNORMAL LOW (ref 39.0–52.0)
Hemoglobin: 12.8 g/dL — ABNORMAL LOW (ref 13.0–17.0)
MCH: 32.2 pg (ref 26.0–34.0)
MCHC: 34.9 g/dL (ref 30.0–36.0)
MCV: 92.4 fL (ref 80.0–100.0)
Platelets: 72 10*3/uL — ABNORMAL LOW (ref 150–400)
RBC: 3.97 MIL/uL — ABNORMAL LOW (ref 4.22–5.81)
RDW: 12.4 % (ref 11.5–15.5)
WBC: 6.2 10*3/uL (ref 4.0–10.5)
nRBC: 0 % (ref 0.0–0.2)

## 2021-02-03 LAB — BASIC METABOLIC PANEL
Anion gap: 4 — ABNORMAL LOW (ref 5–15)
BUN: 28 mg/dL — ABNORMAL HIGH (ref 8–23)
CO2: 23 mmol/L (ref 22–32)
Calcium: 8.7 mg/dL — ABNORMAL LOW (ref 8.9–10.3)
Chloride: 109 mmol/L (ref 98–111)
Creatinine, Ser: 1.47 mg/dL — ABNORMAL HIGH (ref 0.61–1.24)
GFR, Estimated: 46 mL/min — ABNORMAL LOW (ref >60)
Glucose, Bld: 296 mg/dL — ABNORMAL HIGH (ref 70–99)
Potassium: 3.7 mmol/L (ref 3.5–5.1)
Sodium: 136 mmol/L (ref 135–145)

## 2021-02-03 LAB — RESP PANEL BY RT-PCR (FLU A&B, COVID) ARPGX2
Influenza A by PCR: NEGATIVE
Influenza B by PCR: NEGATIVE
SARS Coronavirus 2 by RT PCR: NEGATIVE

## 2021-02-03 LAB — URINALYSIS, MICROSCOPIC (REFLEX)
Bacteria, UA: NONE SEEN
Squamous Epithelial / HPF: NONE SEEN (ref 0–5)

## 2021-02-03 LAB — URINALYSIS, ROUTINE W REFLEX MICROSCOPIC
Bilirubin Urine: NEGATIVE
Glucose, UA: NEGATIVE mg/dL
Hgb urine dipstick: NEGATIVE
Ketones, ur: NEGATIVE mg/dL
Leukocytes,Ua: NEGATIVE
Nitrite: NEGATIVE
Protein, ur: 30 mg/dL — AB
Specific Gravity, Urine: 1.02 (ref 1.005–1.030)
pH: 5.5 (ref 5.0–8.0)

## 2021-02-03 LAB — CBG MONITORING, ED
Glucose-Capillary: 283 mg/dL — ABNORMAL HIGH (ref 70–99)
Glucose-Capillary: 164 mg/dL — ABNORMAL HIGH (ref 70–99)

## 2021-02-03 LAB — LIPASE, BLOOD: Lipase: 29 U/L (ref 11–51)

## 2021-02-03 LAB — MAGNESIUM: Magnesium: 2.2 mg/dL (ref 1.7–2.4)

## 2021-02-03 LAB — TROPONIN I (HIGH SENSITIVITY)
Troponin I (High Sensitivity): 24 ng/L — ABNORMAL HIGH (ref <18)
Troponin I (High Sensitivity): 21 ng/L — ABNORMAL HIGH (ref <18)

## 2021-02-03 MED ORDER — PANTOPRAZOLE SODIUM 40 MG PO TBEC
40.0000 mg | DELAYED_RELEASE_TABLET | Freq: Every day | ORAL | Status: DC
  Administered 2021-02-03: 40 mg via ORAL
  Filled 2021-02-03: qty 1

## 2021-02-03 MED ORDER — VITAMIN D3 25 MCG (1000 UNIT) PO TABS
2000.0000 [IU] | ORAL_TABLET | Freq: Every day | ORAL | Status: DC
  Administered 2021-02-03: 2000 [IU] via ORAL
  Filled 2021-02-03 (×2): qty 2

## 2021-02-03 MED ORDER — INSULIN ASPART 100 UNIT/ML IJ SOLN
0.0000 [IU] | Freq: Three times a day (TID) | INTRAMUSCULAR | Status: DC
  Administered 2021-02-03: 3 [IU] via SUBCUTANEOUS
  Filled 2021-02-03: qty 1

## 2021-02-03 MED ORDER — LACTATED RINGERS IV BOLUS
500.0000 mL | Freq: Once | INTRAVENOUS | Status: AC
  Administered 2021-02-03: 500 mL via INTRAVENOUS

## 2021-02-03 MED ORDER — OCUVITE-LUTEIN PO CAPS
1.0000 | ORAL_CAPSULE | Freq: Every day | ORAL | Status: DC
  Administered 2021-02-03: 1 via ORAL
  Filled 2021-02-03: qty 1

## 2021-02-03 MED ORDER — ACETAMINOPHEN 325 MG PO TABS: 650.0000 mg | ORAL_TABLET | Freq: Four times a day (QID) | ORAL | Status: DC | PRN

## 2021-02-03 MED ORDER — SODIUM CHLORIDE 0.9 % IV SOLN: 250.0000 mL | INTRAVENOUS | Status: DC | PRN

## 2021-02-03 MED ORDER — ONDANSETRON HCL 4 MG PO TABS: 4.0000 mg | ORAL_TABLET | Freq: Four times a day (QID) | ORAL | Status: DC | PRN

## 2021-02-03 MED ORDER — CARVEDILOL 6.25 MG PO TABS: 12.5000 mg | ORAL_TABLET | Freq: Two times a day (BID) | ORAL | Status: DC

## 2021-02-03 MED ORDER — INSULIN GLARGINE 100 UNIT/ML ~~LOC~~ SOLN
30.0000 [IU] | Freq: Every day | SUBCUTANEOUS | Status: DC
  Filled 2021-02-03: qty 0.3

## 2021-02-03 MED ORDER — EZETIMIBE 10 MG PO TABS
10.0000 mg | ORAL_TABLET | Freq: Every day | ORAL | Status: DC
  Administered 2021-02-03: 10 mg via ORAL
  Filled 2021-02-03: qty 1

## 2021-02-03 MED ORDER — ACETAMINOPHEN 325 MG RE SUPP: 650.0000 mg | Freq: Four times a day (QID) | RECTAL | Status: DC | PRN

## 2021-02-03 MED ORDER — DOXAZOSIN MESYLATE 2 MG PO TABS
2.0000 mg | ORAL_TABLET | Freq: Two times a day (BID) | ORAL | Status: DC
  Administered 2021-02-03: 2 mg via ORAL
  Filled 2021-02-03 (×2): qty 1

## 2021-02-03 MED ORDER — ISOSORBIDE MONONITRATE ER 60 MG PO TB24
30.0000 mg | ORAL_TABLET | Freq: Every day | ORAL | Status: DC
  Administered 2021-02-03: 30 mg via ORAL
  Filled 2021-02-03: qty 1

## 2021-02-03 MED ORDER — ASPIRIN EC 81 MG PO TBEC
81.0000 mg | DELAYED_RELEASE_TABLET | ORAL | Status: DC
  Administered 2021-02-03: 81 mg via ORAL
  Filled 2021-02-03: qty 1

## 2021-02-03 MED ORDER — ROSUVASTATIN CALCIUM 20 MG PO TABS
20.0000 mg | ORAL_TABLET | Freq: Every day | ORAL | Status: DC
  Administered 2021-02-03: 20 mg via ORAL
  Filled 2021-02-03: qty 1

## 2021-02-03 MED ORDER — ONDANSETRON HCL 4 MG/2ML IJ SOLN: 4.0000 mg | Freq: Four times a day (QID) | INTRAMUSCULAR | Status: DC | PRN

## 2021-02-03 MED ORDER — INSULIN GLARGINE-YFGN 100 UNIT/ML ~~LOC~~ SOLN
30.0000 [IU] | Freq: Every day | SUBCUTANEOUS | Status: DC
  Administered 2021-02-03: 30 [IU] via SUBCUTANEOUS
  Filled 2021-02-03 (×2): qty 0.3

## 2021-02-03 MED ORDER — SODIUM CHLORIDE 0.9% FLUSH: 3.0000 mL | Freq: Two times a day (BID) | INTRAVENOUS | Status: DC

## 2021-02-03 MED ORDER — IRBESARTAN 75 MG PO TABS
37.5000 mg | ORAL_TABLET | Freq: Every day | ORAL | Status: DC
  Administered 2021-02-03: 37.5 mg via ORAL
  Filled 2021-02-03: qty 0.5

## 2021-02-03 MED ORDER — ENOXAPARIN SODIUM 60 MG/0.6ML IJ SOSY: 0.5000 mg/kg | PREFILLED_SYRINGE | INTRAMUSCULAR | Status: DC

## 2021-02-03 MED ORDER — SODIUM CHLORIDE 0.9% FLUSH: 3.0000 mL | INTRAVENOUS | Status: DC | PRN

## 2021-02-03 MED ORDER — DICLOFENAC SODIUM 1 % EX GEL: 2.0000 g | Freq: Four times a day (QID) | CUTANEOUS | Status: DC

## 2021-02-03 MED ORDER — HYDRALAZINE HCL 50 MG PO TABS
25.0000 mg | ORAL_TABLET | Freq: Two times a day (BID) | ORAL | Status: DC
  Administered 2021-02-03: 25 mg via ORAL
  Filled 2021-02-03: qty 1

## 2021-02-03 MED ORDER — FINASTERIDE 5 MG PO TABS
5.0000 mg | ORAL_TABLET | Freq: Every day | ORAL | Status: DC
  Administered 2021-02-03: 5 mg via ORAL
  Filled 2021-02-03: qty 1

## 2021-02-03 NOTE — ED Triage Notes (Addendum)
Pt arrived via POV with wife with reports of altered mental status, pt states he woke up to go to the bathroom and states he felt bad.  Pt's significant other states the patient has been talking out of his.  Pt is alert and oriented to person, situation, place and date. Pt states he took a muscle relaxer at bedtime.   Pt states in triage he just feels "limp"

## 2021-02-03 NOTE — ED Provider Notes (Signed)
[facility] Emergency Department Provider Note   ____________________________________________   Event Date/Time   First MD Initiated Contact with Patient 02/03/21 [phone]     (approximate)  I have reviewed the triage vital signs and the nursing notes.   HISTORY  Chief Complaint Generalized Weakness    HPI [name] [name] is a 87 y.o. male with past medical history of hypertension, hyperlipidemia, diabetes, CAD, CKD, and CHF who presents to the ED complaining of generalized weakness.  Patient reports that yesterday evening he started feeling bad while attending a grandsons basketball game.  He states that he went to bed feeling generally weak, woke up around 1 AM to try to go to the bathroom, but was unable to stand up and ambulate on his own.  He denies any numbness or weakness in 1 specific area of his body, denies any vision changes or speech changes.  Wife states that he has seemed slightly confused at times, seem to be driving erratically when they were coming home from [place] at 8:30 PM.  She states he last seemed completely well at 815 yesterday evening, had been "talking out of his head" at times since then.  Patient denies any fevers, cough, chest pain, shortness of breath, abdominal pain, nausea, or dysuria.  He does report taking a baclofen around bedtime due to pain in his right hip that he has been experiencing for the past couple of days.        Past Medical History:  Diagnosis Date   (HFpEF) heart failure with preserved ejection fraction ([place])    a. 06/3152 Echo: nl systolic fxn, mild LVH, diastolic relaxation abnormality, mildly enlarged LA, mild Ao insufficiency; 01/2018 Echo: EF 55-60%, no rwma, GR2 DD, triv AI, mildly dil LA, mild inc PASP.   BPH (benign prostatic hyperplasia)    Cancer (HCC)    skin   Cataract    Choledocholithiasis    a. 05/2020 ERCP w/ CBD stone removal and biliary sphincterotomy.   Coronary artery disease    a.  01/2005 s/p CABG x 4: LIMA-LAD, VG-Diag, VG-OM3, VG-dRCA; b. 06/2013 Cath: patent grafts; c. 09/2015 Cath: VG->Diag 100, other grafts patent. LAD 100, RCA sev dzs->Med rx; d. 01/2018 Cath: LAD 100ost/p, LCX 50, RCA 100p/d, VG->OM3 100, VG->Diag 100, LIMA->LAD ok, VG->RPAV 72m-->Med rx.   COVID-19    07/2020 had paxlovid    Dyspnea    with exertion   Dysrhythmia    ED (erectile dysfunction)    Elevated LFTs    a. 05/2020 following CBD stone. 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   GERD (gastroesophageal reflux disease)    Hyperlipidemia    Hypertension    IDDM (insulin dependent diabetes mellitus) 2000   Osteoarthritis    PAH (pulmonary artery hypertension) ([place])    a. 01/2018 RHC: PA 51/22 (32).   Perirectal fistula    Pneumonia 12/2016   Shingles 09/04/2018   Tubular adenoma of colon 07/2012   Vertigo     Patient Active Problem List   Diagnosis Date Noted   Weakness 02/03/2021   Acute on chronic heart failure with preserved ejection fraction (HFpEF) ([place]) 12/09/2020   C. difficile diarrhea 10/05/2020   De Quervain's tenosynovitis, left 10/05/2020   Nail, injury by 08/27/2020   Abnormal MRI, lumbar spine 08/12/2020   History of COVID-19 07/22/2020   Melena 05/11/2020   Jaundice 05/11/2020   Choledocholithiasis    AKI (acute kidney injury) ([place]) 04/08/2020  Suspected COVID-19 virus infection 04/06/2020   Candidal intertrigo 03/12/2020   Scrotal lesion 03/12/2020   Cellulitis 05/12/2019   Fibula fracture 05/09/2019   Epigastric pain 05/09/2019   Postherpetic neuralgia 05/09/2019   B12 deficiency 05/09/2019   Pulmonary hypertension, unspecified ([place]) 04/05/2019   Proteinuria, unspecified 04/05/2019   Atypical chest pain 01/08/2019   Weight loss 12/01/2018   Vertigo 10/15/2018   Diabetic retinopathy ([place]) 09/20/2018   Bile duct abnormality 09/12/2018   Stable angina (HCC) 03/12/2018   Chronic low back pain 03/05/2018   Skin  tear of hand without complication, initial encounter 03/05/2018   Abnormal breast exam 01/18/2018   PAC (premature atrial contraction) 01/18/2018   Neuropathic pain 01/18/2018   Demand ischemia ([place])    Acute on chronic kidney failure ([place]) 01/07/2018   Bell's palsy 08/18/2017   H/O cold sores 08/18/2017   Irregular heart beat 11/29/2016   Insomnia 11/10/2016   PLMD (periodic limb movement disorder) 08/17/2016   Mild OSA 08/17/2016   Intolerance of continuous positive airway pressure (CPAP) ventilation 08/17/2016   Chronic arthritis 06/02/2016   CKD (chronic kidney disease) stage 3, GFR 30-59 ml/min (HCC) 15/17/6160   Diastolic dysfunction 73/71/0626   Osteoarthritis 05/04/2016   Presence of aortocoronary bypass graft 05/04/2016   Chronic pain of both knees 10/06/2015   Sensory abnormality of thoracic dermatome distribution 10/06/2015   Spondylosis of lumbar region without myelopathy or radiculopathy 10/06/2015   Unstable angina ([place]) 09/24/2015   S/P CABG x 4    Shortness of breath 05/14/2015   Thrombocytopenia ([place]) 02/07/2015   Type 2 diabetes mellitus with complication, without long-term current use of insulin ([place]) 02/06/2015   Chronic diastolic CHF (congestive heart failure) ([place]) 01/01/2014   CAD (coronary artery disease) 06/03/2013   Diabetic neuropathy ([place]) 11/12/2012   BPH (benign prostatic hyperplasia)    GERD (gastroesophageal reflux disease)    Hyperlipidemia 09/15/2009   Essential hypertension 09/15/2009    Past Surgical History:  Procedure Laterality Date   BACK SURGERY     CARDIAC CATHETERIZATION  06/05/2013   [facility]    CARDIAC CATHETERIZATION N/A 09/24/2015   Procedure: Right Heart Cath and Coronary/Graft Angiography;  Surgeon: Minna Merritts, MD;  Location: [place] [facility];  Service: Cardiovascular;  Laterality: N/A;   CATARACT EXTRACTION  sept 2013   right   CATARACT EXTRACTION W/PHACO Left 03/28/2017   Procedure: CATARACT EXTRACTION PHACO AND  INTRAOCULAR LENS PLACEMENT (IOC);  Surgeon: Birder Robson, MD;  Location: [facility] ORS;  Service: Ophthalmology;  Laterality: Left;  [place] 00:42.0 AP% 15.0 CDE 6.31 Fluid Pack lot # [id number]   CHOLECYSTECTOMY  05/15/2011   Procedure: LAPAROSCOPIC CHOLECYSTECTOMY;  Surgeon: Rolm Bookbinder, MD;  Location: [place];  Service: General;  Laterality: N/A;   COLONOSCOPY W/ POLYPECTOMY     CORONARY ARTERY BYPASS GRAFT  2007   x 4   CORONARY STENT INTERVENTION N/A 12/08/2020   Procedure: CORONARY STENT INTERVENTION;  Surgeon: Nelva Bush, MD;  Location: [place] [facility];  Service: Cardiovascular;  Laterality: N/A;   ERCP N/A 05/05/2020   Procedure: ENDOSCOPIC RETROGRADE CHOLANGIOPANCREATOGRAPHY (ERCP);  Surgeon: Lucilla Lame, MD;  Location: [facility] END[facility];  Service: Endoscopy;  Laterality: N/A;   ERCP N/A 05/12/2020   Procedure: ENDOSCOPIC RETROGRADE CHOLANGIOPANCREATOGRAPHY (ERCP);  Surgeon: Lucilla Lame, MD;  Location[facility] END[facility]COPY;  Service: Endoscopy;  Laterality: N/A;   ESOPHAGOGASTRODUODENOSCOPY N/A 05/12/2020   Procedure: ESOPHAGOGASTRODUODENOSCOPY (EGD);  Surgeon: Lucilla Lame, MD;  Location: [facility] ENDOSCOPY;  Service: Endoscopy;  Laterality: N/A;  EYE SURGERY     FOOT SURGERY  2012   right foot   JOINT REPLACEMENT  8/10   Right THR--[name]   LEFT AND RIGHT HEART CATHETERIZATION WITH CORONARY/GRAFT ANGIOGRAM N/A 06/05/2013   Procedure: LEFT AND RIGHT HEART CATHETERIZATION WITH Beatrix Fetters;  Surgeon: Peter M Martinique, MD;  Location: [facility] CATH LAB;  Service: Cardiovascular;  Laterality: N/A;   LUMBAR LAMINECTOMY  1989   Prostate photovaporization  5/16   Dr Budd Palmer   RIGHT/LEFT HEART CATH AND CORONARY/GRAFT ANGIOGRAPHY N/A 01/05/2018   Procedure: RIGHT/LEFT HEART CATH AND CORONARY/GRAFT ANGIOGRAPHY;  Surgeon: Minna Merritts, MD;  Location: [place] [facility];  Service: Cardiovascular;  Laterality: N/A;   RIGHT/LEFT HEART CATH AND CORONARY/GRAFT ANGIOGRAPHY Bilateral  12/08/2020   Procedure: RIGHT/LEFT HEART CATH AND CORONARY/GRAFT ANGIOGRAPHY;  Surgeon: Nelva Bush, MD;  Location: [place] [facility];  Service: Cardiovascular;  Laterality: Bilateral;    Prior to Admission medications   Medication Sig Start Date End Date Taking? Authorizing Provider  aspirin EC 81 MG tablet Take 81 mg by mouth every other day. In the morning    [provider]  baclofen (LIORESAL) 10 MG tablet Take 1 tablet (10 mg total) by mouth 3 (three) times daily. 02/01/21   Margarette Canada, NP  BD PEN NEEDLE NANO U/F 32G X 4 MM MISC USE AS DIRECTED 10/27/20   [name], MD  carvedilol (COREG) 12.5 MG tablet TAKE ONE (1) TABLET BY MOUTH TWO TIMES PER DAY 10/07/20   Minna Merritts, MD  Cholecalciferol (VITAMIN D3) 50 MCG (2000 UT) TABS Take 2,000 Units by mouth in the morning.    [provider]  diclofenac Sodium (VOLTAREN) 1 % GEL Apply 2 g topically 4 (four) times daily. 02/01/21   Margarette Canada, NP  doxazosin (CARDURA) 2 MG tablet Take 1 tablet (2 mg total) by mouth 2 (two) times daily. 09/02/20   Minna Merritts, MD  ezetimibe (ZETIA) 10 MG tablet Take 1 tablet (10 mg total) by mouth daily. 01/11/21 04/11/21  Minna Merritts, MD  finasteride (PROSCAR) 5 MG tablet Take 1 tablet (5 mg total) by mouth daily. Patient taking differently: Take 5 mg by mouth every evening. 03/11/20   [name], MD  gabapentin (NEURONTIN) 100 MG capsule TAKE 1 CAPSULE BY MOUTH 3 TIMES DAILY 12/04/20   [name], MD  hydrALAZINE (APRESOLINE) 25 MG tablet Take 25 mg by mouth in the morning and at bedtime. 01/05/21   [provider]  HYDROcodone-acetaminophen (NORCO) 5-325 MG tablet Take 1 tablet by mouth 3 (three) times daily as needed for moderate pain. Patient taking differently: Take 1 tablet by mouth daily as needed for moderate pain (severe back pain.). 08/12/20   McLean-Scocuzza, Nino Glow, MD  isosorbide mononitrate (IMDUR) 30 MG 24 hr tablet Take 1 tablet  (30 mg total) by mouth daily. 12/10/20   Theora Gianotti, NP  loperamide (IMODIUM A-D) 2 MG tablet Take 1 tablet (2 mg total) by mouth 2 (two) times daily as needed for up to 60 doses for diarrhea or loose stools. Patient taking differently: Take 2 mg by mouth in the morning. 11/13/20   Virgel Manifold, MD  Multiple Vitamins-Minerals (PRESERVISION AREDS 2 PO) Take 1 tablet by mouth in the morning and at bedtime.    [provider]  omeprazole (PRILOSEC) 20 MG capsule TAKE 1 CAPSULE BY MOUTH TWICE DAILY 01/14/21   [name], MD  potassium chloride (KLOR-CON) 10 MEQ tablet  Take 1 tablet (10 mEq total) by mouth daily as needed. Take with torsemide 12/22/20   Minna Merritts, MD  Probiotic Product (PROBIOTIC-10 PO) Take 1 capsule by mouth in the morning.    [provider]  rosuvastatin (CRESTOR) 20 MG tablet Take 1 tablet (20 mg total) by mouth daily. 01/26/21   Minna Merritts, MD  torsemide (DEMADEX) 20 MG tablet For weight 210 to 215 take torsemide 20 mg with potassium 10 meq. For weight > Torsemide 20 mg twice a day  (8 am and 2 pm) With potassium 20 meq 12/22/20   Gollan, Kathlene November, MD  TOUJEO SOLOSTAR 300 UNIT/ML Solostar Pen INJECT 50 UNITS DAILY MAY INCREASE 2U EVERY 3 DAYS UNTIL FBG IS AT GOAL UP TO 80 UNITS DAILY Patient taking differently: Inject 41 Units into the skin in the morning. 06/22/20   [name], MD  valsartan (DIOVAN) 320 MG tablet Take 1 tablet (320 mg total) by mouth daily. 10/23/20   Theora Gianotti, NP    Allergies Cortisone and Metformin  Family History  Problem Relation Age of Onset   Lung cancer Mother    Heart disease Father    Colon cancer Neg Hx    Breast cancer Neg Hx     Social History Social History   Tobacco Use   Smoking status: Never   Smokeless tobacco: Never  Vaping Use   Vaping Use: Never used  Substance Use Topics   Alcohol use: Yes    Comment: occ cocktail   Drug use: No     Review of Systems  Constitutional: No fever/chills.  Positive for generalized weakness. Eyes: No visual changes. ENT: No sore throat. Cardiovascular: Denies chest pain. Respiratory: Denies shortness of breath. Gastrointestinal: No abdominal pain.  No nausea, no vomiting.  No diarrhea.  No constipation. Genitourinary: Negative for dysuria. Musculoskeletal: Negative for back pain. Skin: Negative for rash. Neurological: Negative for headaches, focal weakness or numbness.  ____________________________________________   PHYSICAL EXAM:  VITAL SIGNS: ED Triage Vitals  Enc Vitals Group     BP 02/03/21 0252 (!) 198/79     Pulse Rate 02/03/21 0252 77     Resp 02/03/21 0252 18     Temp 02/03/21 0252 97.6 F (36.4 C)     Temp Source 02/03/21 0252 Oral     SpO2 02/03/21 0252 98 %     Weight 02/03/21 0254 221 lb 8 oz (100.5 kg)     Height 02/03/21 0254 6' (1.829 m)     Head Circumference --      Peak Flow --      Pain Score 02/03/21 0253 0     Pain Loc --      Pain Edu? --      Excl. in [place]? --     Constitutional: Alert and oriented to person, place, time, and situation. Eyes: Conjunctivae are normal. Head: Atraumatic. Nose: No congestion/rhinnorhea. Mouth/Throat: Mucous membranes are moist. Neck: Normal ROM Cardiovascular: Bradycardic, irregularly irregular rhythm. Grossly normal heart sounds.  2+ radial pulses bilaterally. Respiratory: Normal respiratory effort.  No retractions. Lungs CTAB. Gastrointestinal: Soft and nontender. No distention. Genitourinary: deferred Musculoskeletal: No lower extremity tenderness nor edema. Neurologic:  Normal speech and language. No gross focal neurologic deficits are appreciated. Skin:  Skin is warm, dry and intact. No rash noted. Psychiatric: Mood and affect are normal. Speech and behavior are normal.  ____________________________________________   LABS (all labs ordered are listed, but only abnormal results are displayed)  Labs  Reviewed  BASIC METABOLIC PANEL - Abnormal; Notable for the following components:      Result Value   Glucose, Bld 296 (*)    BUN 28 (*)    Creatinine, Ser 1.47 (*)    Calcium 8.7 (*)    GFR, Estimated 46 (*)    Anion gap 4 (*)    All other components within normal limits  CBC - Abnormal; Notable for the following components:   RBC 3.97 (*)    Hemoglobin 12.8 (*)    HCT 36.7 (*)    Platelets 72 (*)    All other components within normal limits  URINALYSIS, ROUTINE W REFLEX MICROSCOPIC - Abnormal; Notable for the following components:   Protein, ur 30 (*)    All other components within normal limits  HEPATIC FUNCTION PANEL - Abnormal; Notable for the following components:   Total Protein 6.0 (*)    Alkaline Phosphatase 140 (*)    All other components within normal limits  CBG MONITORING, ED - Abnormal; Notable for the following components:   Glucose-Capillary 283 (*)    All other components within normal limits  TROPONIN I (HIGH SENSITIVITY) - Abnormal; Notable for the following components:   Troponin I (High Sensitivity) 24 (*)    All other components within normal limits  TROPONIN I (HIGH SENSITIVITY) - Abnormal; Notable for the following components:   Troponin I (High Sensitivity) 21 (*)    All other components within normal limits  RESP PANEL BY RT-PCR (FLU A&B, COVID) ARPGX2  LIPASE, BLOOD  MAGNESIUM  URINALYSIS, MICROSCOPIC (REFLEX)  CBG MONITORING, ED   ____________________________________________  EKG  ED ECG REPORT I, Blake Divine, the attending physician, personally viewed and interpreted this ECG.   Date: 02/03/2021  EKG Time: 2:53  Rate: 57  Rhythm: sinus bradycardia, PAC's noted  Axis: Normal  Intervals:right bundle branch block  ST&T Change: Inferior T wave inversions   PROCEDURES  Procedure(s) performed (including Critical Care):  Procedures   ____________________________________________   INITIAL IMPRESSION / ASSESSMENT AND PLAN / ED  COURSE      87 year old male with past medical history of hypertension, hyperlipidemia, diabetes, CAD, CKD, and CHF who presents to the ED with acute onset generalized weakness yesterday evening.  Patient has no focal neurologic deficits and I have a low suspicion for stroke, he is outside the window for tPA and no neurologic findings to suggest large vessel occlusion.  He is noted to be mildly bradycardic in triage but with stable blood pressure, EKG is similar to previous with sinus rhythm with PACs, right bundle blanch block, and inferior T wave inversions.  We will check troponin and continue to monitor on cardiac monitor.  CT head is pending, also check chest x-ray and UA for possible infectious process.  CT head is negative for acute process, chest x-ray reviewed by me and shows no infiltrate, edema, or effusion.  Additional labs are unremarkable and UA shows no signs of infection.  Patient remains extremely unsteady on his feet and disoriented at times, I am concerned the recently prescribed baclofen could be contributing to his presentation.  Given his inability to walk with confusion, we also cannot exclude stroke.  Patient will require admission for further work-up, case discussed with hospitalist.      ____________________________________________   FINAL CLINICAL IMPRESSION(S) / ED DIAGNOSES  Final diagnoses:  Altered mental status, unspecified altered mental status type  Generalized weakness     ED Discharge Orders  None        Note:  This document was prepared using Dragon voice recognition software and may include unintentional dictation errors.    Blake Divine, MD 02/03/21 [phone]

## 2021-02-03 NOTE — ED Notes (Signed)
Blue and red tube sent to lab with save label

## 2021-02-03 NOTE — ED Notes (Signed)
Pt at radiology for ct at this time

## 2021-02-03 NOTE — ED Notes (Addendum)
SO states the pt started acting bizarre while driving home from [place] this past evening around 830pm and he asked SO 3 times if they needed to stop anywhere and was driving slowly.  SO states since the pt woke up around 2am he was weak and was afraid he was going to fall and states during the night he had been making a lot of different noises and "talking out of his head"  Pt has been able to answer questions, however, at times he does appear to be confused and frequently looking for his SO who is standing just behind him.  Per SO pt's LKW was around 815pm 02/02/21

## 2021-02-03 NOTE — ED Notes (Signed)
ED Provider at bedside. 

## 2021-02-03 NOTE — Discharge Summary (Signed)
Physician Discharge Summary  ALAIN DESCHENE XIP:[id number] DOB: 1933-11-05 DOA: 02/03/2021  PCP: [name], MD  Admit date: 02/03/2021 Discharge date: 02/03/2021  Admitted From: Home  Disposition: Home   Recommendations for Outpatient Follow-up:  Follow up with PCP in 1-2 weeks Please obtain BMP/CBC in one week Please follow up on the following pending results:  Home Health: NO Equipment/Devices:None   Discharge Condition: Stable CODE STATUS: FULL  Diet recommendation: Carb Modified   Brief/Interim Summary: [name] [name] is a 87 y.o. male with medical history significant for chronic diastolic dysfunction CHF, BPH, coronary artery disease status post CABG, insulin-dependent diabetes mellitus, hypertension, GERD who was brought into the ER for evaluation of change in mental status. Most of the history was obtained from chart review was at the bedside. She stated that 1 day prior to his admission the patient had complained of just feeling unwell but was unable to localize any symptoms.  She stated that they  were driving home from [place] at about 8:30 PM the night prior to his admission when she noted that he was confused and kept repeating what she had said to him several times.  She also stated that he was driving really slow even though there was no need for that since there was no traffic " he was acting like he was drunk".  They got home and the patient took a muscle relaxer that was prescribed for him and went straight to bed. She stated that he woke up around 2 AM and was so weak and unable to get out of bed because he was afraid he was going to fall so she brought him into the ER for further evaluation. Patient was started on baclofen on 11/28 for sciatica and his significant other thinks his symptoms started not too long after he started taking this medication. He denies any history of alcohol use. Patient's mental status improved markedly and he is back to his  baseline.  He is awake, alert and oriented to person place and time.  He was seen and evaluated by physical therapy and has no needs.  He and his partner have requested to be discharged home. I have advised patient to discontinue the baclofen and to take the rest of his medications as recommended. He will need to follow-up with his primary care provider as an outpatient altered mental status    Discharge Diagnoses:  Principal Problem:   Delirium Active Problems:   Essential hypertension   BPH (benign prostatic hyperplasia)   GERD (gastroesophageal reflux disease)   CAD (coronary artery disease)   Chronic diastolic CHF (congestive heart failure) ([place])   CKD stage 3 due to type 2 diabetes mellitus ([place])   Weakness    Discharge Instructions  Discharge Instructions     Diet - low sodium heart healthy   Complete by: As directed    Diet Carb Modified   Complete by: As directed    Increase activity slowly   Complete by: As directed       Allergies as of 02/03/2021       Reactions   Cortisone Other (See Comments)   Leg Cramps   Metformin Diarrhea        Medication List     STOP taking these medications    baclofen 10 MG tablet Commonly known as: LIORESAL   diclofenac Sodium 1 % Gel Commonly known as: VOLTAREN   HYDROcodone-acetaminophen 5-325 MG tablet Commonly known as: Norco   ipratropium 0.06 % nasal  spray Commonly known as: ATROVENT   PROBIOTIC-10 PO       TAKE these medications    aspirin EC 81 MG tablet Take 81 mg by mouth every other day. In the morning   BD Pen Needle Nano U/F 32G X 4 MM Misc Generic drug: Insulin Pen Needle USE AS DIRECTED   carvedilol 12.5 MG tablet Commonly known as: COREG TAKE ONE (1) TABLET BY MOUTH TWO TIMES PER DAY   doxazosin 2 MG tablet Commonly known as: Cardura Take 1 tablet (2 mg total) by mouth 2 (two) times daily.   ezetimibe 10 MG tablet Commonly known as: ZETIA Take 1 tablet (10 mg total) by mouth  daily.   finasteride 5 MG tablet Commonly known as: PROSCAR Take 1 tablet (5 mg total) by mouth daily. What changed: when to take this   gabapentin 100 MG capsule Commonly known as: NEURONTIN TAKE 1 CAPSULE BY MOUTH 3 TIMES DAILY   hydrALAZINE 25 MG tablet Commonly known as: APRESOLINE Take 25 mg by mouth in the morning and at bedtime.   isosorbide mononitrate 30 MG 24 hr tablet Commonly known as: IMDUR Take 1 tablet (30 mg total) by mouth daily.   loperamide 2 MG tablet Commonly known as: Imodium A-D Take 1 tablet (2 mg total) by mouth 2 (two) times daily as needed for up to 60 doses for diarrhea or loose stools. What changed: when to take this   omeprazole 20 MG capsule Commonly known as: PRILOSEC TAKE 1 CAPSULE BY MOUTH TWICE DAILY   potassium chloride 10 MEQ tablet Commonly known as: KLOR-CON Take 1 tablet (10 mEq total) by mouth daily as needed. Take with torsemide   PRESERVISION AREDS 2 PO Take 1 tablet by mouth in the morning and at bedtime.   rosuvastatin 20 MG tablet Commonly known as: CRESTOR Take 1 tablet (20 mg total) by mouth daily.   torsemide 20 MG tablet Commonly known as: DEMADEX For weight 210 to 215 take torsemide 20 mg with potassium 10 meq. For weight > Torsemide 20 mg twice a day  (8 am and 2 pm) With potassium 20 meq   Toujeo SoloStar 300 UNIT/ML Solostar Pen Generic drug: insulin glargine (1 Unit Dial) INJECT 50 UNITS DAILY MAY INCREASE 2U EVERY 3 DAYS UNTIL FBG IS AT GOAL UP TO 80 UNITS DAILY What changed: See the new instructions.   valsartan 320 MG tablet Commonly known as: DIOVAN Take 1 tablet (320 mg total) by mouth daily.   Vitamin D3 50 MCG (2000 UT) Tabs Take 2,000 Units by mouth in the morning.        Follow-up Information     [name], MD. Schedule an appointment as soon as possible for a visit in 1 week(s).   Specialty: Family Medicine Contact information: [address] [place]  [address] [phone]                Allergies  Allergen Reactions   Cortisone Other (See Comments)    Leg Cramps   Metformin Diarrhea    Consultations: Specify Physician/Group. None    Procedures/Studies: DG Chest 2 View  Result Date: 02/03/2021 CLINICAL DATA:  Altered mental status. EXAM: CHEST - 2 VIEW COMPARISON:  December 31, 2020 FINDINGS: Multiple sternal wires and vascular clips are seen. There is no evidence of acute infiltrate, pleural effusion or pneumothorax. The heart size and mediastinal contours are within normal limits. There is moderate severity calcification of the aortic arch. The visualized  skeletal structures are unremarkable. IMPRESSION: Evidence of prior median sternotomy/CABG without acute or active cardiopulmonary disease. Electronically Signed   By: Virgina Norfolk M.D.   On: 02/03/2021 04:22   CT HEAD WO CONTRAST  Result Date: 02/03/2021 CLINICAL DATA:  Acute neurologic deficit EXAM: CT HEAD WITHOUT CONTRAST TECHNIQUE: Contiguous axial images were obtained from the base of the skull through the vertex without intravenous contrast. COMPARISON:  06/03/2013 FINDINGS: Brain: There is no mass, hemorrhage or extra-axial collection. The size and configuration of the ventricles and extra-axial CSF spaces are normal. The brain parenchyma is normal, without acute or chronic infarction. Vascular: Atherosclerotic calcification of the vertebral and internal carotid arteries at the skull base. No abnormal hyperdensity of the major intracranial arteries or dural venous sinuses. Skull: The visualized skull base, calvarium and extracranial soft tissues are normal. Sinuses/Orbits: No fluid levels or advanced mucosal thickening of the visualized paranasal sinuses. No mastoid or middle ear effusion. The orbits are normal. IMPRESSION: No acute intracranial abnormality. Electronically Signed   By: Ulyses Jarred M.D.   On: 02/03/2021 03:48   DG Hip Unilat W or Wo Pelvis 2-3 Views  Right  Result Date: 02/03/2021 CLINICAL DATA:  Altered mental status and right hip pain. EXAM: DG HIP (WITH OR WITHOUT PELVIS) 2-3V RIGHT COMPARISON:  None. FINDINGS: There is no evidence of an acute hip fracture or dislocation. A total right hip replacement is seen without evidence of surrounding lucency to suggest the presence of hardware loosening or infection. Mild to moderate severity vascular calcification is seen. IMPRESSION: 1. No acute osseous abnormality. 2. Right hip replacement without evidence of hardware complication. Electronically Signed   By: Virgina Norfolk M.D.   On: 02/03/2021 04:23   (Echo, Carotid, EGD, Colonoscopy, ERCP)    Subjective:   Discharge Exam: Vitals:   02/03/21 1528 02/03/21 1530  BP: (!) 141/61 (!) 150/116  Pulse: (!) 59 (!) 59  Resp: 18   Temp: 97.7 F (36.5 C)   SpO2: 98% 96%   Vitals:   02/03/21 1215 02/03/21 1246 02/03/21 1528 02/03/21 1530  BP:  (!) 180/70 (!) 141/61 (!) 150/116  Pulse: (!) 29  (!) 59 (!) 59  Resp:   18   Temp:   97.7 F (36.5 C)   TempSrc:   Oral   SpO2: 97%  98% 96%  Weight:      Height:        General: Pt is alert, awake, not in acute distress.  He is oriented to person, place and time Cardiovascular: RRR, S1/S2 +, no rubs, no gallops Respiratory: CTA bilaterally, no wheezing, no rhonchi Abdominal: Soft, NT, ND, bowel sounds + Extremities: no edema, no cyanosis    The results of significant diagnostics from this hospitalization (including imaging, microbiology, ancillary and laboratory) are listed below for reference.     Microbiology: Recent Results (from the past 240 hour(s))  Resp Panel by RT-PCR (Flu A&B, Covid) Nasopharyngeal Swab     Status: None   Collection Time: 02/03/21  4:05 AM   Specimen: Nasopharyngeal Swab; Nasopharyngeal(NP) swabs in vial transport medium  Result Value Ref Range Status   SARS Coronavirus 2 by RT PCR NEGATIVE NEGATIVE Final    Comment: (NOTE) SARS-CoV-2 target nucleic acids  are NOT DETECTED.  The SARS-CoV-2 RNA is generally detectable in upper respiratory specimens during the acute phase of infection. The lowest concentration of SARS-CoV-2 viral copies this assay can detect is 138 copies/mL. A negative result does not preclude SARS-Cov-2 infection and  should not be used as the sole basis for treatment or other patient management decisions. A negative result may occur with  improper specimen collection/handling, submission of specimen other than nasopharyngeal swab, presence of viral mutation(s) within the areas targeted by this assay, and inadequate number of viral copies(<138 copies/mL). A negative result must be combined with clinical observations, patient history, and epidemiological information. The expected result is Negative.  Fact Sheet for Patients:  EntrepreneurPulse.com.au  Fact Sheet for Healthcare Providers:  IncredibleEmployment.be  This test is no t yet approved or cleared by the [place] FDA and  has been authorized for detection and/or diagnosis of SARS-CoV-2 by FDA under an Emergency Use Authorization (EUA). This EUA will remain  in effect (meaning this test can be used) for the duration of the COVID-19 declaration under Section 564(b)(1) of the Act, 21 U.S.C.section 360bbb-3(b)(1), unless the authorization is terminated  or revoked sooner.       Influenza A by PCR NEGATIVE NEGATIVE Final   Influenza B by PCR NEGATIVE NEGATIVE Final    Comment: (NOTE) The Xpert Xpress SARS-CoV-2/FLU/RSV plus assay is intended as an aid in the diagnosis of influenza from Nasopharyngeal swab specimens and should not be used as a sole basis for treatment. Nasal washings and aspirates are unacceptable for Xpert Xpress SARS-CoV-2/FLU/RSV testing.  Fact Sheet for Patients: EntrepreneurPulse.com.au  Fact Sheet for Healthcare Providers: IncredibleEmployment.be  This test is  not yet approved or cleared by the [place] FDA and has been authorized for detection and/or diagnosis of SARS-CoV-2 by FDA under an Emergency Use Authorization (EUA). This EUA will remain in effect (meaning this test can be used) for the duration of the COVID-19 declaration under Section 564(b)(1) of the Act, 21 U.S.C. section 360bbb-3(b)(1), unless the authorization is terminated or revoked.  Performed at [facility], [address] [place],  [address]      Labs: BNP (last 3 results) No results for input(s): BNP in the last 8760 hours. Basic Metabolic Panel: Recent Labs  Lab 02/03/21 0303  NA 136  K 3.7  CL 109  CO2 23  GLUCOSE 296*  BUN 28*  CREATININE 1.47*  CALCIUM 8.7*  MG 2.2   Liver Function Tests: Recent Labs  Lab 02/03/21 0303  AST 25  ALT 22  ALKPHOS 140*  BILITOT 0.8  PROT 6.0*  ALBUMIN 3.6   Recent Labs  Lab 02/03/21 0303  LIPASE 29   No results for input(s): AMMONIA in the last 168 hours. CBC: Recent Labs  Lab 02/03/21 0303  WBC 6.2  HGB 12.8*  HCT 36.7*  MCV 92.4  PLT 72*   Cardiac Enzymes: No results for input(s): CKTOTAL, CKMB, CKMBINDEX, TROPONINI in the last 168 hours. BNP: Invalid input(s): POCBNP CBG: Recent Labs  Lab 02/03/21 0249 02/03/21 1238  GLUCAP 283* 164*   D-Dimer No results for input(s): DDIMER in the last 72 hours. Hgb A1c No results for input(s): HGBA1C in the last 72 hours. Lipid Profile No results for input(s): CHOL, HDL, LDLCALC, TRIG, CHOLHDL, LDLDIRECT in the last 72 hours. Thyroid function studies No results for input(s): TSH, T4TOTAL, T3FREE, THYROIDAB in the last 72 hours.  Invalid input(s): FREET3 Anemia work up No results for input(s): VITAMINB12, FOLATE, FERRITIN, TIBC, IRON, RETICCTPCT in the last 72 hours. Urinalysis    Component Value Date/Time   COLORURINE YELLOW 02/03/2021 0405   APPEARANCEUR CLEAR 02/03/2021 0405   APPEARANCEUR Clear 12/27/2017 0943   LABSPEC  1.020 02/03/2021 0405   LABSPEC 1.018  02/22/2013 2122   PHURINE 5.5 02/03/2021 0405   GLUCOSEU NEGATIVE 02/03/2021 0405   GLUCOSEU Negative 02/22/2013 2122   HGBUR NEGATIVE 02/03/2021 0405   BILIRUBINUR NEGATIVE 02/03/2021 0405   BILIRUBINUR small 12/07/2018 1118   BILIRUBINUR Negative 12/27/2017 0943   BILIRUBINUR Negative 02/22/2013 2122   KETONESUR NEGATIVE 02/03/2021 0405   PROTEINUR 30 (A) 02/03/2021 0405   UROBILINOGEN 0.2 12/07/2018 1118   UROBILINOGEN 0.2 06/03/2013 2218   NITRITE NEGATIVE 02/03/2021 0405   LEUKOCYTESUR NEGATIVE 02/03/2021 0405   LEUKOCYTESUR Negative 02/22/2013 2122   Sepsis Labs Invalid input(s): PROCALCITONIN,  WBC,  LACTICIDVEN Microbiology Recent Results (from the past 240 hour(s))  Resp Panel by RT-PCR (Flu A&B, Covid) Nasopharyngeal Swab     Status: None   Collection Time: 02/03/21  4:05 AM   Specimen: Nasopharyngeal Swab; Nasopharyngeal(NP) swabs in vial transport medium  Result Value Ref Range Status   SARS Coronavirus 2 by RT PCR NEGATIVE NEGATIVE Final    Comment: (NOTE) SARS-CoV-2 target nucleic acids are NOT DETECTED.  The SARS-CoV-2 RNA is generally detectable in upper respiratory specimens during the acute phase of infection. The lowest concentration of SARS-CoV-2 viral copies this assay can detect is 138 copies/mL. A negative result does not preclude SARS-Cov-2 infection and should not be used as the sole basis for treatment or other patient management decisions. A negative result may occur with  improper specimen collection/handling, submission of specimen other than nasopharyngeal swab, presence of viral mutation(s) within the areas targeted by this assay, and inadequate number of viral copies(<138 copies/mL). A negative result must be combined with clinical observations, patient history, and epidemiological information. The expected result is Negative.  Fact Sheet for Patients:  EntrepreneurPulse.com.au  Fact  Sheet for Healthcare Providers:  IncredibleEmployment.be  This test is no t yet approved or cleared by the [place] FDA and  has been authorized for detection and/or diagnosis of SARS-CoV-2 by FDA under an Emergency Use Authorization (EUA). This EUA will remain  in effect (meaning this test can be used) for the duration of the COVID-19 declaration under Section 564(b)(1) of the Act, 21 U.S.C.section 360bbb-3(b)(1), unless the authorization is terminated  or revoked sooner.       Influenza A by PCR NEGATIVE NEGATIVE Final   Influenza B by PCR NEGATIVE NEGATIVE Final    Comment: (NOTE) The Xpert Xpress SARS-CoV-2/FLU/RSV plus assay is intended as an aid in the diagnosis of influenza from Nasopharyngeal swab specimens and should not be used as a sole basis for treatment. Nasal washings and aspirates are unacceptable for Xpert Xpress SARS-CoV-2/FLU/RSV testing.  Fact Sheet for Patients: EntrepreneurPulse.com.au  Fact Sheet for Healthcare Providers: IncredibleEmployment.be  This test is not yet approved or cleared by the [place] FDA and has been authorized for detection and/or diagnosis of SARS-CoV-2 by FDA under an Emergency Use Authorization (EUA). This EUA will remain in effect (meaning this test can be used) for the duration of the COVID-19 declaration under Section 564(b)(1) of the Act, 21 U.S.C. section 360bbb-3(b)(1), unless the authorization is terminated or revoked.  Performed at [facility], [address] [place], [place] [address]      Time coordinating discharge: Over 30 minutes  SIGNED:   Collier Bullock, MD  [facility] 02/03/2021, 4:41 PM Pager   If 7PM-7AM, please contact night-coverage [email or web address] Password [email or web address]

## 2021-02-03 NOTE — ED Notes (Signed)
Patient awaken and sat on side of be to take medications and to eat some crackers. He gave his lunch tray to his wife.

## 2021-02-03 NOTE — ED Notes (Signed)
Sitting up in chair with wife no complaints of discomfort.

## 2021-02-03 NOTE — H&P (Signed)
History and Physical    [name] [name] RFX:[id number] DOB: [birth year] DOA: 02/03/2021  PCP: [name], MD   Patient coming from: Home  I have personally briefly reviewed patient's old medical records in [place]  Chief Complaint: Change in mental status Most of the history was obtained from patient's partner who was at the bedside.   HPI: [name] [name] is a 87 y.o. male with medical history significant for chronic diastolic dysfunction CHF, BPH, coronary artery disease status post CABG, insulin-dependent diabetes mellitus, hypertension, GERD who was brought into the ER for evaluation of change in mental status. She states that 1 day prior to his admission the patient had complained of just feeling unwell but was unable to localize any symptoms.  She states that he was driving home from [place] at about 8:30 PM the night prior to his admission when she noted that he was confused and kept repeating what she had said to him several times.  She also states that he was driving really slow even though there was no need for that since there was no traffic " he was acting like he was drunk".  They got home and the patient took a muscle relaxer and went straight to bed. She states that he woke up around 2 AM and was so weak and unable to get out of bed because he was afraid he was going to fall so she brought him into the ER for further evaluation. Patient was started on baclofen on 11/28 for sciatica and his significant other thinks his symptoms started not too long after he started taking this medication. He denies any history of alcohol use.  He denies having any fever, no chills, no cough, no dizziness, no lightheadedness, no headache, no urinary frequency, no nocturia, no dysuria, no abdominal pain, no changes in his bowel habits, no lower extremity swelling. Sodium 136, potassium 3.7, chloride 109, bicarb 23, glucose 296, BUN 28, creatinine 1.47, calcium 8.7, magnesium 2.2,  alkaline phosphatase 140, albumin 3.6, lipase 29, AST 25, ALT 22, total protein 6.0, troponin 24, white count 6.2, hemoglobin 12.8, hematocrit 36.7, MCV 92.4, RDW 12.4, platelet count 72 Respiratory viral panel is negative CT scan of the head without contrast shows no acute intracranial abnormality Twelve-lead EKG reviewed by me shows sinus bradycardia with PACs.  Right bundle branch block.    ED Course: Patient is an 87 year old male who presents to the emergency room for evaluation of change in mental status which his partner describes as confusion and generalized weakness. Patient was recently started on baclofen for sciatica. He will be referred to observation status for further evaluation.   Review of Systems: As per HPI otherwise all other systems reviewed and negative.    Past Medical History:  Diagnosis Date   (HFpEF) heart failure with preserved ejection fraction ([place])    a. 04/6413 Echo: nl systolic fxn, mild LVH, diastolic relaxation abnormality, mildly enlarged LA, mild Ao insufficiency; 01/2018 Echo: EF 55-60%, no rwma, GR2 DD, triv AI, mildly dil LA, mild inc PASP.   BPH (benign prostatic hyperplasia)    Cancer (HCC)    skin   Cataract    Choledocholithiasis    a. 05/2020 ERCP w/ CBD stone removal and biliary sphincterotomy.   Coronary artery disease    a. 01/2005 s/p CABG x 4: LIMA-LAD, VG-Diag, VG-OM3, VG-dRCA; b. 06/2013 Cath: patent grafts; c. 09/2015 Cath: VG->Diag 100, other grafts patent. LAD 100, RCA sev dzs->Med rx; d. 01/2018 Cath: LAD  100ost/p, LCX 50, RCA 100p/d, VG->OM3 100, VG->Diag 100, LIMA->LAD ok, VG->RPAV 8m-->Med rx.   COVID-19    07/2020 had paxlovid    Dyspnea    with exertion   Dysrhythmia    ED (erectile dysfunction)    Elevated LFTs    a. 05/2020 following CBD stone. 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   GERD (gastroesophageal reflux disease)    Hyperlipidemia    Hypertension    IDDM (insulin  dependent diabetes mellitus) 2000   Osteoarthritis    PAH (pulmonary artery hypertension) ([place])    a. 01/2018 RHC: PA 51/22 (32).   Perirectal fistula    Pneumonia 12/2016   Shingles 09/04/2018   Tubular adenoma of colon 07/2012   Vertigo     Past Surgical History:  Procedure Laterality Date   BACK SURGERY     CARDIAC CATHETERIZATION  06/05/2013   [facility]    CARDIAC CATHETERIZATION N/A 09/24/2015   Procedure: Right Heart Cath and Coronary/Graft Angiography;  Surgeon: Minna Merritts, MD;  Location: [place] [facility];  Service: Cardiovascular;  Laterality: N/A;   CATARACT EXTRACTION  sept 2013   right   CATARACT EXTRACTION W/PHACO Left 03/28/2017   Procedure: CATARACT EXTRACTION PHACO AND INTRAOCULAR LENS PLACEMENT (IOC);  Surgeon: Birder Robson, MD;  Location: [facility];  Service: Ophthalmology;  Laterality: Left;  [place] 00:42.0 AP% 15.0 CDE 6.31 Fluid Pack lot # [id number]   CHOLECYSTECTOMY  05/15/2011   Procedure: LAPAROSCOPIC CHOLECYSTECTOMY;  Surgeon: Rolm Bookbinder, MD;  Location: [facility];  Service: General;  Laterality: N/A;   COLONOSCOPY W/ POLYPECTOMY     CORONARY ARTERY BYPASS GRAFT  2007   x 4   CORONARY STENT INTERVENTION N/A 12/08/2020   Procedure: CORONARY STENT INTERVENTION;  Surgeon: Nelva Bush, MD;  Location: [place] [facility];  Service: Cardiovascular;  Laterality: N/A;   ERCP N/A 05/05/2020   Procedure: ENDOSCOPIC RETROGRADE CHOLANGIOPANCREATOGRAPHY (ERCP);  Surgeon: Lucilla Lame, MD;  Location: [facility]OS[facility];  Service: Endoscopy;  Laterality: N/A;   ERCP N/A 05/12/2020   Procedure: ENDOSCOPIC RETROGRADE CHOLANGIOPANCREATOGRAPHY (ERCP);  Surgeon: Lucilla Lame, MD;  Location: [facility] ENDOSCOPY;  Service: Endoscopy;  Laterality: N/A;   ESOPHAGOGASTRODUODENOSCOPY N/A 05/12/2020   Procedure: ESOPHAGOGASTRODUODENOSCOPY (EGD);  Surgeon: Lucilla Lame, MD;  Location: [facility] ENDOSCOPY;  Service: Endoscopy;  Laterality: N/A;   EYE SURGERY     FOOT SURGERY  2012   right  foot   JOINT REPLACEMENT  8/10   Right THR--[name]   LEFT AND RIGHT HEART CATHETERIZATION WITH CORONARY/GRAFT ANGIOGRAM N/A 06/05/2013   Procedure: LEFT AND RIGHT HEART CATHETERIZATION WITH Beatrix Fetters;  Surgeon: Peter M Martinique, MD;  Location[facility] CATH LAB;  Service: Cardiovascular;  Laterality: N/A;   LUMBAR LAMINECTOMY  1989   Prostate photovaporization  5/16   Dr Budd Palmer   RIGHT/LEFT HEART CATH AND CORONARY/GRAFT ANGIOGRAPHY N/A 01/05/2018   Procedure: RIGHT/LEFT HEART CATH AND CORONARY/GRAFT ANGIOGRAPHY;  Surgeon: Minna Merritts, MD;  Location: [place] [facility];  Service: Cardiovascular;  Laterality: N/A;   RIGHT/LEFT HEART CATH AND CORONARY/GRAFT ANGIOGRAPHY Bilateral 12/08/2020   Procedure: RIGHT/LEFT HEART CATH AND CORONARY/GRAFT ANGIOGRAPHY;  Surgeon: Nelva Bush, MD;  Location: [place] [facility];  Service: Cardiovascular;  Laterality: Bilateral;     reports that he has never smoked. He has never used smokeless tobacco. He reports current alcohol use. He reports that he does not use drugs.  Allergies  Allergen Reactions   Cortisone Other (See Comments)  Leg Cramps   Metformin Diarrhea    Family History  Problem Relation Age of Onset   Lung cancer Mother    Heart disease Father    Colon cancer Neg Hx    Breast cancer Neg Hx       Prior to Admission medications   Medication Sig Start Date End Date Taking? Authorizing Provider  aspirin EC 81 MG tablet Take 81 mg by mouth every other day. In the morning    [provider]  baclofen (LIORESAL) 10 MG tablet Take 1 tablet (10 mg total) by mouth 3 (three) times daily. 02/01/21   Margarette Canada, NP  BD PEN NEEDLE NANO U/F 32G X 4 MM MISC USE AS DIRECTED 10/27/20   [name], MD  carvedilol (COREG) 12.5 MG tablet TAKE ONE (1) TABLET BY MOUTH TWO TIMES PER DAY 10/07/20   Minna Merritts, MD  Cholecalciferol (VITAMIN D3) 50 MCG (2000 UT) TABS Take 2,000 Units by mouth in the morning.     [provider]  diclofenac Sodium (VOLTAREN) 1 % GEL Apply 2 g topically 4 (four) times daily. 02/01/21   Margarette Canada, NP  doxazosin (CARDURA) 2 MG tablet Take 1 tablet (2 mg total) by mouth 2 (two) times daily. 09/02/20   Minna Merritts, MD  ezetimibe (ZETIA) 10 MG tablet Take 1 tablet (10 mg total) by mouth daily. 01/11/21 04/11/21  Minna Merritts, MD  finasteride (PROSCAR) 5 MG tablet Take 1 tablet (5 mg total) by mouth daily. Patient taking differently: Take 5 mg by mouth every evening. 03/11/20   [name], MD  gabapentin (NEURONTIN) 100 MG capsule TAKE 1 CAPSULE BY MOUTH 3 TIMES DAILY 12/04/20   [name], MD  hydrALAZINE (APRESOLINE) 25 MG tablet Take 25 mg by mouth in the morning and at bedtime. 01/05/21   [provider]  HYDROcodone-acetaminophen (NORCO) 5-325 MG tablet Take 1 tablet by mouth 3 (three) times daily as needed for moderate pain. Patient taking differently: Take 1 tablet by mouth daily as needed for moderate pain (severe back pain.). 08/12/20   McLean-Scocuzza, Nino Glow, MD  isosorbide mononitrate (IMDUR) 30 MG 24 hr tablet Take 1 tablet (30 mg total) by mouth daily. 12/10/20   Theora Gianotti, NP  loperamide (IMODIUM A-D) 2 MG tablet Take 1 tablet (2 mg total) by mouth 2 (two) times daily as needed for up to 60 doses for diarrhea or loose stools. Patient taking differently: Take 2 mg by mouth in the morning. 11/13/20   Virgel Manifold, MD  Multiple Vitamins-Minerals (PRESERVISION AREDS 2 PO) Take 1 tablet by mouth in the morning and at bedtime.    [provider]  omeprazole (PRILOSEC) 20 MG capsule TAKE 1 CAPSULE BY MOUTH TWICE DAILY 01/14/21   [name], MD  potassium chloride (KLOR-CON) 10 MEQ tablet Take 1 tablet (10 mEq total) by mouth daily as needed. Take with torsemide 12/22/20   Minna Merritts, MD  Probiotic Product (PROBIOTIC-10 PO) Take 1 capsule by mouth in the morning.    [provider]   rosuvastatin (CRESTOR) 20 MG tablet Take 1 tablet (20 mg total) by mouth daily. 01/26/21   Minna Merritts, MD  torsemide (DEMADEX) 20 MG tablet For weight 210 to 215 take torsemide 20 mg with potassium 10 meq. For weight > Torsemide 20 mg twice a day  (8 am and 2 pm) With potassium 20 meq 12/22/20   Gollan, Kathlene November, MD  TOUJEO SOLOSTAR 300 UNIT/ML Solostar Pen INJECT 50 UNITS DAILY MAY INCREASE 2U EVERY 3 DAYS UNTIL FBG IS AT GOAL UP TO 80 UNITS DAILY Patient taking differently: Inject 41 Units into the skin in the morning. 06/22/20   [name], MD  valsartan (DIOVAN) 320 MG tablet Take 1 tablet (320 mg total) by mouth daily. 10/23/20   Theora Gianotti, NP    Physical Exam: Vitals:   02/03/21 0332 02/03/21 0415 02/03/21 0500 02/03/21 0600  BP: (!) 201/76 (!) 184/61 (!) 186/69 (!) 162/84  Pulse: (!) 52 (!) 49 (!) 54 (!) 49  Resp: 15 19 17 17   Temp:    97.9 F (36.6 C)  TempSrc:    Oral  SpO2: 98% 99% 97% 99%  Weight:      Height:         Vitals:   02/03/21 0332 02/03/21 0415 02/03/21 0500 02/03/21 0600  BP: (!) 201/76 (!) 184/61 (!) 186/69 (!) 162/84  Pulse: (!) 52 (!) 49 (!) 54 (!) 49  Resp: 15 19 17 17   Temp:    97.9 F (36.6 C)  TempSrc:    Oral  SpO2: 98% 99% 97% 99%  Weight:      Height:          Constitutional: Sleeping but arouses easily.  Oriented to person, place and time.  Not in any apparent distress HEENT:      Head: Normocephalic and atraumatic.         Eyes: PERLA, EOMI, Conjunctivae are normal. Sclera is non-icteric.       Mouth/Throat: Mucous membranes are moist.       Neck: Supple with no signs of meningismus. Cardiovascular: Regular rate and rhythm. No murmurs, gallops, or rubs. 2+ symmetrical distal pulses are present . No JVD. No LE edema Respiratory: Respiratory effort normal .Lungs sounds clear bilaterally. No wheezes, crackles, or rhonchi.  Gastrointestinal: Soft, non tender, and non distended with positive bowel sounds.   Genitourinary: No CVA tenderness. Musculoskeletal: Nontender with normal range of motion in all extremities. No cyanosis, or erythema of extremities. Neurologic:  Face is symmetric. Moving all extremities. No gross focal neurologic deficits. Skin: Skin is warm, dry.  No rash or ulcers Psychiatric: Mood and affect are normal    Labs on Admission: I have personally reviewed following labs and imaging studies  CBC: Recent Labs  Lab 02/03/21 0303  WBC 6.2  HGB 12.8*  HCT 36.7*  MCV 92.4  PLT 72*   Basic Metabolic Panel: Recent Labs  Lab 02/03/21 0303  NA 136  K 3.7  CL 109  CO2 23  GLUCOSE 296*  BUN 28*  CREATININE 1.47*  CALCIUM 8.7*  MG 2.2   GFR: Estimated Creatinine Clearance: 45.1 mL/min (A) (by C-G formula based on SCr of 1.47 mg/dL (H)). Liver Function Tests: Recent Labs  Lab 02/03/21 0303  AST 25  ALT 22  ALKPHOS 140*  BILITOT 0.8  PROT 6.0*  ALBUMIN 3.6   Recent Labs  Lab 02/03/21 0303  LIPASE 29   No results for input(s): AMMONIA in the last 168 hours. Coagulation Profile: No results for input(s): INR, PROTIME in the last 168 hours. Cardiac Enzymes: No results for input(s): CKTOTAL, CKMB, CKMBINDEX, TROPONINI in the last 168 hours. BNP (last 3 results) No results for input(s): PROBNP in the last 8760 hours. HbA1C: No results for input(s): HGBA1C in the last 72 hours. CBG: Recent Labs  Lab 02/03/21 0249  GLUCAP 283*   Lipid Profile:  No results for input(s): CHOL, HDL, LDLCALC, TRIG, CHOLHDL, LDLDIRECT in the last 72 hours. Thyroid Function Tests: No results for input(s): TSH, T4TOTAL, FREET4, T3FREE, THYROIDAB in the last 72 hours. Anemia Panel: No results for input(s): VITAMINB12, FOLATE, FERRITIN, TIBC, IRON, RETICCTPCT in the last 72 hours. Urine analysis:    Component Value Date/Time   COLORURINE YELLOW 02/03/2021 0405   APPEARANCEUR CLEAR 02/03/2021 0405   APPEARANCEUR Clear 12/27/2017 0943   LABSPEC 1.020 02/03/2021 0405    LABSPEC 1.018 02/22/2013 2122   PHURINE 5.5 02/03/2021 0405   GLUCOSEU NEGATIVE 02/03/2021 0405   GLUCOSEU Negative 02/22/2013 2122   HGBUR NEGATIVE 02/03/2021 0405   BILIRUBINUR NEGATIVE 02/03/2021 0405   BILIRUBINUR small 12/07/2018 1118   BILIRUBINUR Negative 12/27/2017 0943   BILIRUBINUR Negative 02/22/2013 2122   KETONESUR NEGATIVE 02/03/2021 0405   PROTEINUR 30 (A) 02/03/2021 0405   UROBILINOGEN 0.2 12/07/2018 1118   UROBILINOGEN 0.2 06/03/2013 2218   NITRITE NEGATIVE 02/03/2021 0405   LEUKOCYTESUR NEGATIVE 02/03/2021 0405   LEUKOCYTESUR Negative 02/22/2013 2122    Radiological Exams on Admission: DG Chest 2 View  Result Date: 02/03/2021 CLINICAL DATA:  Altered mental status. EXAM: CHEST - 2 VIEW COMPARISON:  December 31, 2020 FINDINGS: Multiple sternal wires and vascular clips are seen. There is no evidence of acute infiltrate, pleural effusion or pneumothorax. The heart size and mediastinal contours are within normal limits. There is moderate severity calcification of the aortic arch. The visualized skeletal structures are unremarkable. IMPRESSION: Evidence of prior median sternotomy/CABG without acute or active cardiopulmonary disease. Electronically Signed   By: Virgina Norfolk M.D.   On: 02/03/2021 04:22   CT HEAD WO CONTRAST  Result Date: 02/03/2021 CLINICAL DATA:  Acute neurologic deficit EXAM: CT HEAD WITHOUT CONTRAST TECHNIQUE: Contiguous axial images were obtained from the base of the skull through the vertex without intravenous contrast. COMPARISON:  06/03/2013 FINDINGS: Brain: There is no mass, hemorrhage or extra-axial collection. The size and configuration of the ventricles and extra-axial CSF spaces are normal. The brain parenchyma is normal, without acute or chronic infarction. Vascular: Atherosclerotic calcification of the vertebral and internal carotid arteries at the skull base. No abnormal hyperdensity of the major intracranial arteries or dural venous sinuses.  Skull: The visualized skull base, calvarium and extracranial soft tissues are normal. Sinuses/Orbits: No fluid levels or advanced mucosal thickening of the visualized paranasal sinuses. No mastoid or middle ear effusion. The orbits are normal. IMPRESSION: No acute intracranial abnormality. Electronically Signed   By: Ulyses Jarred M.D.   On: 02/03/2021 03:48   DG Hip Unilat W or Wo Pelvis 2-3 Views Right  Result Date: 02/03/2021 CLINICAL DATA:  Altered mental status and right hip pain. EXAM: DG HIP (WITH OR WITHOUT PELVIS) 2-3V RIGHT COMPARISON:  None. FINDINGS: There is no evidence of an acute hip fracture or dislocation. A total right hip replacement is seen without evidence of surrounding lucency to suggest the presence of hardware loosening or infection. Mild to moderate severity vascular calcification is seen. IMPRESSION: 1. No acute osseous abnormality. 2. Right hip replacement without evidence of hardware complication. Electronically Signed   By: Virgina Norfolk M.D.   On: 02/03/2021 04:23     Assessment/Plan Principal Problem:   AMS (altered mental status) Active Problems:   Essential hypertension   CAD (coronary artery disease)   CKD stage 3 due to type 2 diabetes mellitus (HCC)   Weakness    Patient is an 87 year old male who presents to the emergency room for  evaluation of change in mental status    Altered mental status Consent for delirium caused by medication Patient was recently started on baclofen for sciatica Will hold baclofen and gabapentin for now Monitor mental status closely    Weakness Most likely medication induced We will request PT evaluation    Diabetes mellitus with stage III chronic kidney disease Renal function is stable Glycemic control with sliding scale insulin    Chronic diastolic dysfunction CHF Stable and not acutely exacerbated Continue carvedilol and Avapro    History of coronary artery disease status post CABG Stable Continue  carvedilol, nitrates, statins and aspirin   DVT prophylaxis: Lovenox  Code Status: full code  Family Communication: Greater than 50% of time was spent discussing patient's condition and plan of care with his significant other at the bedside.  All questions and concerns have been addressed.  She verbalizes understanding and agrees with the plan. Disposition Plan: Back to previous home environment Consults called: none  Status:Observation    Chanda Laperle MD [facility]     02/03/2021, 9:30 AM

## 2021-02-03 NOTE — Evaluation (Signed)
Physical Therapy Evaluation Patient Details Name: [name] [name] MRN: [id number] DOB: Nov 14, 1933 Today's Date: 02/03/2021  History of Present Illness  [name] [name] is a 87 y.o. male with medical history significant for chronic diastolic dysfunction CHF, BPH, coronary artery disease status post CABG, insulin-dependent diabetes mellitus, hypertension, GERD who was brought into the ER for evaluation of change in mental status. Patient was started on baclofen on 11/28 for sciatica and his significant other thinks his symptoms started not too long after he started taking this medication.   Clinical Impression  Patient is received lying on stretcher, wife present in room. Patient is alert, agrees to PT assessment. He is independent with supine to sit, transfers with supervision. Patient is able to ambulate around room ~50 feet without ad and supervision. Reports some right hip pain that is chronic. Patient and wife feel like patient is back to baseline level of function. He has no further skilled PT needs at this time.         Recommendations for follow up therapy are one component of a multi-disciplinary discharge planning process, led by the attending physician.  Recommendations may be updated based on patient status, additional functional criteria and insurance authorization.  Follow Up Recommendations No PT follow up    Assistance Recommended at Discharge None  Functional Status Assessment Patient has had a recent decline in their functional status and demonstrates the ability to make significant improvements in function in a reasonable and predictable amount of time.  Equipment Recommendations  None recommended by PT    Recommendations for Other Services       Precautions / Restrictions Precautions Precautions: Fall Restrictions Weight Bearing Restrictions: No      Mobility  Bed Mobility Overal bed mobility: Independent                  Transfers Overall transfer  level: Modified independent Equipment used: None                    Ambulation/Gait Ambulation/Gait assistance: Supervision Gait Distance (Feet): 50 Feet Assistive device: None Gait Pattern/deviations: Step-through pattern;Drifts right/left Gait velocity: decr     General Gait Details: patient without overt lob with ambulation. Improved with increased distance.Per wife, he appears at baseline  Financial trader Rankin (Stroke Patients Only)       Balance Overall balance assessment: Mild deficits observed, not formally tested                                           Pertinent Vitals/Pain Pain Assessment: Faces Faces Pain Scale: Hurts a little bit Pain Location: R hip Pain Descriptors / Indicators: Discomfort Pain Intervention(s): Monitored during session;Limited activity within patient's tolerance    Home Living Family/patient expects to be discharged to:: Private residence Living Arrangements: Spouse/significant other Available Help at Discharge: Family;Available 24 hours/day             Home Equipment: Cane - single Barista (2 wheels)      Prior Function Prior Level of Function : Independent/Modified Independent;Driving             Mobility Comments: independent ADLs Comments: independent     Hand Dominance        Extremity/Trunk Assessment   Upper Extremity Assessment Upper Extremity Assessment:  Overall [facility] for tasks assessed    Lower Extremity Assessment Lower Extremity Assessment: Overall WFL for tasks assessed    Cervical / Trunk Assessment Cervical / Trunk Assessment: Normal  Communication   Communication: No difficulties  Cognition Arousal/Alertness: Awake/alert Behavior During Therapy: WFL for tasks assessed/performed Overall Cognitive Status: Within Functional Limits for tasks assessed                                           General Comments      Exercises     Assessment/Plan    PT Assessment Patient does not need any further PT services  PT Problem List         PT Treatment Interventions      PT Goals (Current goals can be found in the Care Plan section)  Acute Rehab PT Goals Patient Stated Goal: to return home PT Goal Formulation: With patient/family Time For Goal Achievement: 02/06/21 Potential to Achieve Goals: Good    Frequency     Barriers to discharge        Co-evaluation               AM-PAC PT "6 Clicks" Mobility  Outcome Measure Help needed turning from your back to your side while in a flat bed without using bedrails?: None Help needed moving from lying on your back to sitting on the side of a flat bed without using bedrails?: None Help needed moving to and from a bed to a chair (including a wheelchair)?: None Help needed standing up from a chair using your arms (e.g., wheelchair or bedside chair)?: None Help needed to walk in hospital room?: None Help needed climbing 3-5 steps with a railing? : A Little 6 Click Score: 23    End of Session   Activity Tolerance: Patient tolerated treatment well Patient left: in bed;with call bell/phone within reach;with family/visitor present Nurse Communication: Mobility status PT Visit Diagnosis: Muscle weakness (generalized) (M62.81)    Time: 1962-2297 PT Time Calculation (min) (ACUTE ONLY): 16 min   Charges:   PT Evaluation $PT Eval Moderate Complexity: 1 Mod          Adrienne Delay, PT, GCS 02/03/21,3:25 PM

## 2021-02-04 LAB — HEMOGLOBIN A1C
Hgb A1c MFr Bld: 7.4 % — ABNORMAL HIGH (ref 4.8–5.6)
Mean Plasma Glucose: 166 mg/dL

## 2021-02-10 ENCOUNTER — Other Ambulatory Visit: Payer: Self-pay | Admitting: Internal Medicine

## 2021-02-10 DIAGNOSIS — R937 Abnormal findings on diagnostic imaging of other parts of musculoskeletal system: Secondary | ICD-10-CM

## 2021-02-10 DIAGNOSIS — M545 Low back pain, unspecified: Secondary | ICD-10-CM

## 2021-02-10 DIAGNOSIS — G8929 Other chronic pain: Secondary | ICD-10-CM

## 2021-02-12 ENCOUNTER — Ambulatory Visit (INDEPENDENT_AMBULATORY_CARE_PROVIDER_SITE_OTHER): Payer: Medicare Other | Admitting: Family Medicine

## 2021-02-12 ENCOUNTER — Other Ambulatory Visit: Payer: Self-pay

## 2021-02-12 ENCOUNTER — Encounter: Payer: Self-pay | Admitting: Family Medicine

## 2021-02-12 DIAGNOSIS — N400 Enlarged prostate without lower urinary tract symptoms: Secondary | ICD-10-CM

## 2021-02-12 DIAGNOSIS — G8929 Other chronic pain: Secondary | ICD-10-CM

## 2021-02-12 DIAGNOSIS — M545 Low back pain, unspecified: Secondary | ICD-10-CM

## 2021-02-12 DIAGNOSIS — R410 Disorientation, unspecified: Secondary | ICD-10-CM | POA: Diagnosis not present

## 2021-02-12 DIAGNOSIS — E118 Type 2 diabetes mellitus with unspecified complications: Secondary | ICD-10-CM | POA: Diagnosis not present

## 2021-02-12 MED ORDER — LIDOCAINE 5 % EX PTCH: 1.0000 | MEDICATED_PATCH | CUTANEOUS | 0 refills | Status: DC

## 2021-02-12 NOTE — Assessment & Plan Note (Signed)
Likely related to the baclofen he was taking.  He will remain off of this and monitor for any further issues.  We will follow-up on a CBC and BMP.

## 2021-02-12 NOTE — Assessment & Plan Note (Addendum)
The patient has had an acute on chronic exacerbation.  He is progressively improving.  He is neurologically intact.  He can use lidocaine patches and he will monitor.  If he does not continue to improve he will let us know.

## 2021-02-12 NOTE — Patient Instructions (Addendum)
Nice to see you. I sent the lidocaine patches in for you.  If your back does not continue to progressively improve please let us know. We will get lab work today. Please do not take any more muscle relaxers. Please monitor your urination and if it changes at all please let me know so we can have you see urology.

## 2021-02-12 NOTE — Progress Notes (Signed)
[name], MD Phone: [phone]  [name] [name] is a 87 y.o. male who presents today for follow-up.  BPH: Patient notes he is on finasteride.  He had a procedure for his prostate about 7 years ago.  Recently he has noted slightly slower flow with urination.  He notes no excessive urinary frequency.  No urgency.  He does feel as though he empties his bladder.  No straining.  No dysuria.  He gets up 1 or 2 times per night.  He is on Cardura for his blood pressure.  Diabetes: Most recent A1c is 7.4.  Home sugars range 115-234.  He is on Toujeo 40 units daily.  No polyuria or polydipsia.  No hypoglycemia.  Altered mental status: Patient was in the hospital for altered mental status and generalized weakness several weeks ago.  He had an extensive work-up that was unremarkable for a cause of his symptoms.  It was felt as though his symptoms were related to him taking baclofen.  He has since discontinued that and has not had any recurrence of the altered mental status since discharge.  Back pain: Patient notes ongoing issues with back pain.  He notes several weeks ago he had sudden onset back discomfort.  He notes it is slowly improving.  He was prescribed baclofen at that time.  He wonders about lidocaine patches for his chronic back discomfort.  Social History   Tobacco Use  Smoking Status Never  Smokeless Tobacco Never    Current Outpatient Medications on File Prior to Visit  Medication Sig Dispense Refill   aspirin EC 81 MG tablet Take 81 mg by mouth every other day. In the morning     BD PEN NEEDLE NANO U/F 32G X 4 MM MISC USE AS DIRECTED 100 each 0   carvedilol (COREG) 12.5 MG tablet TAKE ONE (1) TABLET BY MOUTH TWO TIMES PER DAY 180 tablet 0   Cholecalciferol (VITAMIN D3) 50 MCG (2000 UT) TABS Take 2,000 Units by mouth in the morning.     doxazosin (CARDURA) 2 MG tablet Take 1 tablet (2 mg total) by mouth 2 (two) times daily. 60 tablet 6   ezetimibe (ZETIA) 10 MG tablet Take 1  tablet (10 mg total) by mouth daily. 90 tablet 3   finasteride (PROSCAR) 5 MG tablet Take 1 tablet (5 mg total) by mouth daily. (Patient taking differently: Take 5 mg by mouth every evening.) 90 tablet 1   gabapentin (NEURONTIN) 100 MG capsule TAKE 1 CAPSULE BY MOUTH 3 TIMES DAILY 90 capsule 1   hydrALAZINE (APRESOLINE) 25 MG tablet Take 25 mg by mouth in the morning and at bedtime.     isosorbide mononitrate (IMDUR) 30 MG 24 hr tablet Take 1 tablet (30 mg total) by mouth daily. 30 tablet 6   loperamide (IMODIUM A-D) 2 MG tablet Take 1 tablet (2 mg total) by mouth 2 (two) times daily as needed for up to 60 doses for diarrhea or loose stools. (Patient taking differently: Take 2 mg by mouth in the morning.) 60 tablet 0   Multiple Vitamins-Minerals (PRESERVISION AREDS 2 PO) Take 1 tablet by mouth in the morning and at bedtime.     omeprazole (PRILOSEC) 20 MG capsule TAKE 1 CAPSULE BY MOUTH TWICE DAILY 60 capsule 1   potassium chloride (KLOR-CON) 10 MEQ tablet Take 1 tablet (10 mEq total) by mouth daily as needed. Take with torsemide 90 tablet 3   rosuvastatin (CRESTOR) 20 MG tablet Take 1 tablet (20 mg total) by  mouth daily. 90 tablet 3   torsemide (DEMADEX) 20 MG tablet For weight 210 to 215 take torsemide 20 mg with potassium 10 meq. For weight > Torsemide 20 mg twice a day  (8 am and 2 pm) With potassium 20 meq 200 tablet 3   TOUJEO SOLOSTAR 300 UNIT/ML Solostar Pen INJECT 50 UNITS DAILY MAY INCREASE 2U EVERY 3 DAYS UNTIL FBG IS AT GOAL UP TO 80 UNITS DAILY (Patient taking differently: Inject 41 Units into the skin in the morning.) 22.5 mL 6   valsartan (DIOVAN) 320 MG tablet Take 1 tablet (320 mg total) by mouth daily. 90 tablet 3   No current facility-administered medications on file prior to visit.     ROS see history of present illness  Objective  Physical Exam Vitals:   02/12/21 1425  BP: 140/60  Pulse: (!) 54  Temp: 98.1 F (36.7 C)  SpO2: 97%    BP Readings from Last 3  Encounters:  02/12/21 140/60  02/03/21 (!) 147/55  02/01/21 133/63   Wt Readings from Last 3 Encounters:  02/12/21 221 lb 9.6 oz (100.5 kg)  02/03/21 221 lb 8 oz (100.5 kg)  01/26/21 221 lb 8 oz (100.5 kg)    Physical Exam Constitutional:      General: He is not in acute distress.    Appearance: He is not diaphoretic.  Cardiovascular:     Rate and Rhythm: Normal rate and regular rhythm.     Heart sounds: Normal heart sounds.  Pulmonary:     Effort: Pulmonary effort is normal.     Breath sounds: Normal breath sounds.  Skin:    General: Skin is warm and dry.  Neurological:     Mental Status: He is alert.     Comments: 5/5 strength in bilateral biceps, triceps, grip, quads, hamstrings, plantar and dorsiflexion, sensation to light touch intact in bilateral UE and LE     Assessment/Plan: Please see individual problem list.  Problem List Items Addressed This Visit     AMS (altered mental status)    Likely related to the baclofen he was taking.  He will remain off of this and monitor for any further issues.  We will follow-up on a CBC and BMP.      Relevant Orders   Basic Metabolic Panel (BMET)   CBC   BPH (benign prostatic hyperplasia)    He will remain on finasteride 5 mg once daily.  If he has any further progression of symptoms he will let us know and we can have him see urology.      Chronic low back pain    The patient has had an acute on chronic exacerbation.  He is progressively improving.  He is neurologically intact.  He can use lidocaine patches and he will monitor.  If he does not continue to improve he will let us know.      Relevant Medications   lidocaine (LIDODERM) 5 %   Type 2 diabetes mellitus with complication, without long-term current use of insulin (HCC)    Adequately controlled for his age.  He will continue Toujeo 40 units once daily.       Return in about 6 months (around 08/13/2021).  This visit occurred during the SARS-CoV-2 public health  emergency.  Safety protocols were in place, including screening questions prior to the visit, additional usage of staff PPE, and extensive cleaning of exam room while observing appropriate contact time as indicated for disinfecting solutions.    Randall Hiss  [name], MD [place]

## 2021-02-12 NOTE — Assessment & Plan Note (Signed)
He will remain on finasteride 5 mg once daily.  If he has any further progression of symptoms he will let us know and we can have him see urology.

## 2021-02-12 NOTE — Assessment & Plan Note (Signed)
Adequately controlled for his age.  He will continue Toujeo 40 units once daily.

## 2021-02-13 LAB — BASIC METABOLIC PANEL
BUN/Creatinine Ratio: 16 (calc) (ref 6–22)
BUN: 25 mg/dL (ref 7–25)
CO2: 26 mmol/L (ref 20–32)
Calcium: 8.9 mg/dL (ref 8.6–10.3)
Chloride: 105 mmol/L (ref 98–110)
Creat: 1.52 mg/dL — ABNORMAL HIGH (ref 0.70–1.22)
Glucose, Bld: 266 mg/dL — ABNORMAL HIGH (ref 65–99)
Potassium: 4.2 mmol/L (ref 3.5–5.3)
Sodium: 138 mmol/L (ref 135–146)

## 2021-02-13 LAB — CBC
HCT: 36.1 % — ABNORMAL LOW (ref 38.5–50.0)
Hemoglobin: 12.3 g/dL — ABNORMAL LOW (ref 13.2–17.1)
MCH: 31.9 pg (ref 27.0–33.0)
MCHC: 34.1 g/dL (ref 32.0–36.0)
MCV: 93.5 fL (ref 80.0–100.0)
MPV: 12.5 fL (ref 7.5–12.5)
Platelets: 90 10*3/uL — ABNORMAL LOW (ref 140–400)
RBC: 3.86 10*6/uL — ABNORMAL LOW (ref 4.20–5.80)
RDW: 13.1 % (ref 11.0–15.0)
WBC: 7.9 10*3/uL (ref 3.8–10.8)

## 2021-02-16 ENCOUNTER — Telehealth: Payer: Self-pay | Admitting: Cardiovascular Disease

## 2021-02-16 DIAGNOSIS — M19012 Primary osteoarthritis, left shoulder: Secondary | ICD-10-CM | POA: Diagnosis not present

## 2021-02-16 DIAGNOSIS — M25511 Pain in right shoulder: Secondary | ICD-10-CM | POA: Diagnosis not present

## 2021-02-16 DIAGNOSIS — M25512 Pain in left shoulder: Secondary | ICD-10-CM | POA: Diagnosis not present

## 2021-02-16 DIAGNOSIS — M25532 Pain in left wrist: Secondary | ICD-10-CM | POA: Diagnosis not present

## 2021-02-16 DIAGNOSIS — M19032 Primary osteoarthritis, left wrist: Secondary | ICD-10-CM | POA: Diagnosis not present

## 2021-02-16 NOTE — Telephone Encounter (Signed)
Spoke with patient -  He states that the arthritis is so bad he has trouble driving and can't get comfortable to rest.  Advised that he try Aleve bi x 4 doses total, then go back to apap 650 mg bid.  He can also use capsaicin cream or Aspercream.  He notes Voltaren gel was not helpful at all.

## 2021-02-16 NOTE — Telephone Encounter (Signed)
Pt c/o medication issue:  1. Name of Medication: anti inflammatory   2. How are you currently taking this medication (dosage and times per day)? Please advise   3. Are you having a reaction (difficulty breathing--STAT)? No   4. What is your medication issue? Patient seen by ortho for severe arthritis pain in wrist and states they will not give him anything given cardiac hx and want him to seek advise from [facility] .   Patient states he tried tylenol and it doesn't work .  Patient states he would rather cut off arm than deal with pain.

## 2021-02-23 DIAGNOSIS — C4441 Basal cell carcinoma of skin of scalp and neck: Secondary | ICD-10-CM | POA: Diagnosis not present

## 2021-02-23 DIAGNOSIS — C44319 Basal cell carcinoma of skin of other parts of face: Secondary | ICD-10-CM | POA: Diagnosis not present

## 2021-03-07 DIAGNOSIS — I639 Cerebral infarction, unspecified: Secondary | ICD-10-CM

## 2021-03-07 HISTORY — DX: Cerebral infarction, unspecified: I63.9

## 2021-03-17 DIAGNOSIS — H353211 Exudative age-related macular degeneration, right eye, with active choroidal neovascularization: Secondary | ICD-10-CM | POA: Diagnosis not present

## 2021-03-19 DIAGNOSIS — H43821 Vitreomacular adhesion, right eye: Secondary | ICD-10-CM | POA: Diagnosis not present

## 2021-03-19 DIAGNOSIS — H353211 Exudative age-related macular degeneration, right eye, with active choroidal neovascularization: Secondary | ICD-10-CM | POA: Diagnosis not present

## 2021-03-19 DIAGNOSIS — H43391 Other vitreous opacities, right eye: Secondary | ICD-10-CM | POA: Diagnosis not present

## 2021-03-19 DIAGNOSIS — H43813 Vitreous degeneration, bilateral: Secondary | ICD-10-CM | POA: Diagnosis not present

## 2021-04-07 ENCOUNTER — Other Ambulatory Visit: Payer: Self-pay | Admitting: Cardiovascular Disease

## 2021-04-08 ENCOUNTER — Encounter: Payer: Self-pay | Admitting: Family

## 2021-04-08 ENCOUNTER — Telehealth (INDEPENDENT_AMBULATORY_CARE_PROVIDER_SITE_OTHER): Payer: Medicare Other | Admitting: Family

## 2021-04-08 ENCOUNTER — Other Ambulatory Visit: Payer: Self-pay

## 2021-04-08 ENCOUNTER — Other Ambulatory Visit: Payer: Self-pay | Admitting: Family Medicine

## 2021-04-08 VITALS — Ht 72.0 in | Wt 221.0 lb

## 2021-04-08 DIAGNOSIS — R051 Acute cough: Secondary | ICD-10-CM | POA: Diagnosis not present

## 2021-04-08 DIAGNOSIS — J069 Acute upper respiratory infection, unspecified: Secondary | ICD-10-CM | POA: Diagnosis not present

## 2021-04-08 NOTE — Progress Notes (Signed)
MyChart Video Visit    Virtual Visit via Video Note   This visit type was conducted due to national recommendations for restrictions regarding the COVID-19 Pandemic (e.g. social distancing) in an effort to limit this patient's exposure and mitigate transmission in our community. This patient is at least at moderate risk for complications without adequate follow up. This format is felt to be most appropriate for this patient at this time. Physical exam was limited by quality of the video and audio technology used for the visit. CMA was able to get the patient set up on a video visit.  Patient location: Home. Patient and provider in visit Provider location: Office  I discussed the limitations of evaluation and management by telemedicine and the availability of in person appointments. The patient expressed understanding and agreed to proceed.  Visit Date: 04/08/2021  Today's healthcare provider: Eugenia Pancoast, FNP     Subjective:    Patient ID: [name] [name], male    DOB: 05/29/1933, 88 y.o.   MRN: [id number]  Chief Complaint  Patient presents with   chest congestion   Cough   sneezing   Chest Pain    Chest tightness when he coughs.     Cough Associated symptoms include postnasal drip and rhinorrhea. Pertinent negatives include no chest pain, chills, ear pain, fever, sore throat, shortness of breath or wheezing.  Chest Pain  Associated symptoms include a cough (with chest congetsion). Pertinent negatives include no fever, palpitations or shortness of breath.   Pt here with c/o chest congestion ,sneezing, and nagging cough. This am he states a little worsening sneezing and eyes watering. Currently with cough that is non productive. He denies wheezing and or worsening sob (has CHF with chronic sob). Often with rhinorrhea, draining per him. No sore throat. No ear pain.   Pt denies fever and or chills.  No body aches at present.   Past Medical History:  Diagnosis Date    (HFpEF) heart failure with preserved ejection fraction ([place])    a. 0/6269 Echo: nl systolic fxn, mild LVH, diastolic relaxation abnormality, mildly enlarged LA, mild Ao insufficiency; 01/2018 Echo: EF 55-60%, no rwma, GR2 DD, triv AI, mildly dil LA, mild inc PASP.   AMS (altered mental status) 02/03/2021   BPH (benign prostatic hyperplasia)    Cancer ([place])    skin   Cataract    Choledocholithiasis    a. 05/2020 ERCP w/ CBD stone removal and biliary sphincterotomy.   Coronary artery disease    a. 01/2005 s/p CABG x 4: LIMA-LAD, VG-Diag, VG-OM3, VG-dRCA; b. 06/2013 Cath: patent grafts; c. 09/2015 Cath: VG->Diag 100, other grafts patent. LAD 100, RCA sev dzs->Med rx; d. 01/2018 Cath: LAD 100ost/p, LCX 50, RCA 100p/d, VG->OM3 100, VG->Diag 100, LIMA->LAD ok, VG->RPAV 102m-->Med rx.   COVID-19    07/2020 had paxlovid    Dyspnea    with exertion   Dysrhythmia    ED (erectile dysfunction)    Elevated LFTs    a. 05/2020 following CBD stone. 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   GERD (gastroesophageal reflux disease)    Hyperlipidemia    Hypertension    IDDM (insulin dependent diabetes mellitus) 2000   Osteoarthritis    PAH (pulmonary artery hypertension) ([place])    a. 01/2018 RHC: PA 51/22 (32).   Perirectal fistula    Pneumonia 12/2016   Shingles 09/04/2018   Tubular adenoma of colon 07/2012   Vertigo  Past Surgical History:  Procedure Laterality Date   BACK SURGERY     CARDIAC CATHETERIZATION  06/05/2013   [facility]    CARDIAC CATHETERIZATION N/A 09/24/2015   Procedure: Right Heart Cath and Coronary/Graft Angiography;  Surgeon: Minna Merritts, MD;  Location: [place] [facility];  Service: Cardiovascular;  Laterality: N/A;   CATARACT EXTRACTION  sept 2013   right   CATARACT EXTRACTION W/PHACO Left 03/28/2017   Procedure: CATARACT EXTRACTION PHACO AND INTRAOCULAR LENS PLACEMENT (IOC);  Surgeon: Birder Robson, MD;  Location: [facility] ORS;   Service: Ophthalmology;  Laterality: Left;  [place] 00:42.0 AP% 15.0 CDE 6.31 Fluid Pack lot # [id number]   CHOLECYSTECTOMY  05/15/2011   Procedure: LAPAROSCOPIC CHOLECYSTECTOMY;  Surgeon: Rolm Bookbinder, MD;  Location: [facility];  Service: General;  Laterality: N/A;   COLONOSCOPY W/ POLYPECTOMY     CORONARY ARTERY BYPASS GRAFT  2007   x 4   CORONARY STENT INTERVENTION N/A 12/08/2020   Procedure: CORONARY STENT INTERVENTION;  Surgeon: Nelva Bush, MD;  Location: [place] [facility];  Service: Cardiovascular;  Laterality: N/A;   ERCP N/A 05/05/2020   Procedure: ENDOSCOPIC RETROGRADE CHOLANGIOPANCREATOGRAPHY (ERCP);  Surgeon: Lucilla Lame, MD;  Location: [facility]COPY;  Service: Endoscopy;  Laterality: N/A;   ERCP N/A 05/12/2020   Procedure: ENDOSCOPIC RETROGRADE CHOLANGIOPANCREATOGRAPHY (ERCP);  Surgeon: Lucilla Lame, MD;  Location[facility]COPY;  Service: Endoscopy;  Laterality: N/A;   ESOPHAGOGASTRODUODENOSCOPY N/A 05/12/2020   Procedure: ESOPHAGOGASTRODUODENOSCOPY (EGD);  Surgeon: Lucilla Lame, MD;  Location: [facility] ENDOSCOPY;  Service: Endoscopy;  Laterality: N/A;   EYE SURGERY     FOOT SURGERY  2012   right foot   JOINT REPLACEMENT  8/10   Right THR--[name]   LEFT AND RIGHT HEART CATHETERIZATION WITH CORONARY/GRAFT ANGIOGRAM N/A 06/05/2013   Procedure: LEFT AND RIGHT HEART CATHETERIZATION WITH Beatrix Fetters;  Surgeon: Peter M Martinique, MD;  Location: [facility] C[facility] LAB;  Service: Cardiovascular;  Laterality: N/A;   LUMBAR LAMINECTOMY  1989   Prostate photovaporization  5/16   Dr Budd Palmer   RIGHT/LEFT HEART CATH AND CORONARY/GRAFT ANGIOGRAPHY N/A 01/05/2018   Procedure: RIGHT/LEFT HEART CATH AND CORONARY/GRAFT ANGIOGRAPHY;  Surgeon: Minna Merritts, MD;  Location: [place] [facility];  Service: Cardiovascular;  Laterality: N/A;   RIGHT/LEFT HEART CATH AND CORONARY/GRAFT ANGIOGRAPHY Bilateral 12/08/2020   Procedure: RIGHT/LEFT HEART CATH AND CORONARY/GRAFT ANGIOGRAPHY;  Surgeon: Nelva Bush, MD;  Location: [place] [facility];  Service: Cardiovascular;  Laterality: Bilateral;    Family History  Problem Relation Age of Onset   Lung cancer Mother    Heart disease Father    Colon cancer Neg Hx    Breast cancer Neg Hx     Social History   Socioeconomic History   Marital status: Married    Spouse name: Jan   Number of children: 4   Years of education: Not on file   Highest education level: Not on file  Occupational History   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  Tobacco Use   Smoking status: Never   Smokeless tobacco: Never  Vaping Use   Vaping Use: Never used  Substance and Sexual Activity   Alcohol use: Yes    Comment: occ cocktail   Drug use: No   Sexual activity: Not Currently  Other Topics Concern   Not on file  Social History Narrative   Goes by [place]   Has living will    Has designated [employer] as health care POA  Would accept resuscitation attempts   Would not want tube feeds if cognitively unaware   Social Determinants of Health   Financial Resource Strain: Low Risk    Difficulty of Paying Living Expenses: Not hard at all  Food Insecurity: No Food Insecurity   Worried About Charity fundraiser in the Last Year: Never true   [place] in the Last Year: Never true  Transportation Needs: No Transportation Needs   Lack of Transportation (Medical): No   Lack of Transportation (Non-Medical): No  Physical Activity: Sufficiently Active   Days of Exercise per Week: 4 days   Minutes of Exercise per Session: 40 min  Stress: No Stress Concern Present   Feeling of Stress : Not at all  Social Connections: Unknown   Frequency of Communication with Friends and Family: Not on file   Frequency of Social Gatherings with Friends and Family: Not on file   Attends Religious Services: Not on Electrical engineer or Organizations: Not on file   Attends Archivist Meetings: Not  on file   Marital Status: Married  Human resources officer Violence: Not At Risk   Fear of Current or Ex-Partner: No   Emotionally Abused: No   Physically Abused: No   Sexually Abused: No    Outpatient Medications Prior to Visit  Medication Sig Dispense Refill   aspirin EC 81 MG tablet Take 81 mg by mouth every other day. In the morning     BD PEN NEEDLE NANO U/F 32G X 4 MM MISC USE AS DIRECTED 100 each 0   carvedilol (COREG) 12.5 MG tablet TAKE ONE (1) TABLET BY MOUTH TWO TIMES PER DAY 180 tablet 0   Cholecalciferol (VITAMIN D3) 50 MCG (2000 UT) TABS Take 2,000 Units by mouth in the morning.     doxazosin (CARDURA) 2 MG tablet Take 1 tablet (2 mg total) by mouth 2 (two) times daily. 60 tablet 6   ezetimibe (ZETIA) 10 MG tablet Take 1 tablet (10 mg total) by mouth daily. 90 tablet 3   finasteride (PROSCAR) 5 MG tablet Take 1 tablet (5 mg total) by mouth daily. (Patient taking differently: Take 5 mg by mouth every evening.) 90 tablet 1   gabapentin (NEURONTIN) 100 MG capsule TAKE 1 CAPSULE BY MOUTH 3 TIMES DAILY 90 capsule 1   hydrALAZINE (APRESOLINE) 25 MG tablet Take 25 mg by mouth in the morning and at bedtime.     isosorbide mononitrate (IMDUR) 30 MG 24 hr tablet Take 1 tablet (30 mg total) by mouth daily. 30 tablet 6   lidocaine (LIDODERM) 5 % Place 1 patch onto the skin daily. Remove & Discard patch within 12 hours or as directed by MD 30 patch 0   loperamide (IMODIUM A-D) 2 MG tablet Take 1 tablet (2 mg total) by mouth 2 (two) times daily as needed for up to 60 doses for diarrhea or loose stools. (Patient taking differently: Take 2 mg by mouth in the morning.) 60 tablet 0   Multiple Vitamins-Minerals (PRESERVISION AREDS 2 PO) Take 1 tablet by mouth in the morning and at bedtime.     omeprazole (PRILOSEC) 20 MG capsule TAKE 1 CAPSULE BY MOUTH TWICE DAILY 60 capsule 1   potassium chloride (KLOR-CON) 10 MEQ tablet Take 1 tablet (10 mEq total) by mouth daily as needed. Take with torsemide 90  tablet 3   rosuvastatin (CRESTOR) 20 MG tablet Take 1 tablet (20 mg total) by mouth daily. 90 tablet  3   torsemide (DEMADEX) 20 MG tablet For weight 210 to 215 take torsemide 20 mg with potassium 10 meq. For weight > Torsemide 20 mg twice a day  (8 am and 2 pm) With potassium 20 meq 200 tablet 3   TOUJEO SOLOSTAR 300 UNIT/ML Solostar Pen INJECT 50 UNITS DAILY MAY INCREASE 2U EVERY 3 DAYS UNTIL FBG IS AT GOAL UP TO 80 UNITS DAILY (Patient taking differently: Inject 41 Units into the skin in the morning.) 22.5 mL 6   valsartan (DIOVAN) 320 MG tablet Take 1 tablet (320 mg total) by mouth daily. 90 tablet 3   No facility-administered medications prior to visit.    Allergies  Allergen Reactions   Cortisone Other (See Comments)    Leg Cramps   Metformin Diarrhea    Review of Systems  Constitutional:  Negative for chills and fever.  HENT:  Positive for congestion, postnasal drip and rhinorrhea. Negative for ear pain, sinus pain and sore throat.   Respiratory:  Positive for cough (with chest congetsion). Negative for shortness of breath and wheezing.   Cardiovascular:  Negative for chest pain and palpitations.      Objective:    Physical Exam Constitutional:      General: He is not in acute distress.    Appearance: He is well-developed. He is not ill-appearing, toxic-appearing or diaphoretic.  Pulmonary:     Effort: Pulmonary effort is normal.  Neurological:     General: No focal deficit present.     Mental Status: He is alert.  Psychiatric:        Mood and Affect: Mood normal.        Behavior: Behavior normal.    Ht 6' (1.829 m)    Wt 221 lb (100.2 kg)    BMI 29.97 kg/m  Wt Readings from Last 3 Encounters:  04/08/21 221 lb (100.2 kg)  02/12/21 221 lb 9.6 oz (100.5 kg)  02/03/21 221 lb 8 oz (100.5 kg)       Assessment & Plan:   Problem List Items Addressed This Visit       Respiratory   Viral upper respiratory infection    Suspected viral however have asked pt to come  by to r/o covid. If covid negative and pt worsens with sx happy to see him in office .  Did discuss with him that I feel this is viral in origin as this is only day 2.  Have recommended over-the-counter Mucinex without the DM up with mucus secretions.  If symptoms do not begin to improve and only seem to worsen please let me know immediately.  If he is COVID-positive we will go ahead and send out antiviral as he is a high risk patient.  Increase water intake.  Tylenol as needed for fever if it occurs.          Other   Acute cough - Primary    covid testing ordered, pending results.        Relevant Orders   POC COVID-19 BinaxNow    I am having [name] [name]. Topper maintain his aspirin EC, finasteride, Toujeo SoloStar, doxazosin, valsartan, BD Pen Needle Nano U/F, loperamide, Multiple Vitamins-Minerals (PRESERVISION AREDS 2 PO), Vitamin D3, gabapentin, isosorbide mononitrate, torsemide, potassium chloride, ezetimibe, omeprazole, hydrALAZINE, rosuvastatin, lidocaine, and carvedilol.  No orders of the defined types were placed in this encounter.   I discussed the assessment and treatment plan with the patient. The patient was provided an opportunity to ask questions and all were  answered. The patient agreed with the plan and demonstrated an understanding of the instructions.   The patient was advised to call back or seek an in-person evaluation if the symptoms worsen or if the condition fails to improve as anticipated.  I provided 15 minutes of face-to-face time during this encounter.   Eugenia Pancoast, [facility] [place] [phone] (phone) [phone] (fax)  [place]

## 2021-04-08 NOTE — Patient Instructions (Addendum)
Call this number when you arrive at the back of the building and let them know you are here, they will come out and swab you.   [phone]  This is the address   [place] around to the back of the building, and call the number they will come out. It is the building that says [employer] on the front side.   As discussed on the phone if covid is negative, We will treat this as a viral infection.  Increase water intake, tylenol as needed if fever occurs, and over the counter mucinex (without the DM) if needed for mucous. If your symptoms begin to worsen and covid negative please schedule appointment in the office.  If covid positive we may consider starting the antiviral. I will notify you with the results.   It was a pleasure seeing you today! Please do not hesitate to reach out with any questions and or concerns.  Regards,   Eugenia Pancoast FNP-C

## 2021-04-08 NOTE — Assessment & Plan Note (Signed)
covid testing ordered, pending results.

## 2021-04-08 NOTE — Assessment & Plan Note (Signed)
Suspected viral however have asked pt to come by to r/o covid. If covid negative and pt worsens with sx happy to see him in office .  Did discuss with him that I feel this is viral in origin as this is only day 2.  Have recommended over-the-counter Mucinex without the DM up with mucus secretions.  If symptoms do not begin to improve and only seem to worsen please let me know immediately.  If he is COVID-positive we will go ahead and send out antiviral as he is a high risk patient.  Increase water intake.  Tylenol as needed for fever if it occurs.

## 2021-04-12 ENCOUNTER — Other Ambulatory Visit: Payer: Self-pay | Admitting: Cardiovascular Disease

## 2021-04-19 ENCOUNTER — Telehealth (INDEPENDENT_AMBULATORY_CARE_PROVIDER_SITE_OTHER): Payer: Medicare Other | Admitting: Family Medicine

## 2021-04-19 ENCOUNTER — Encounter: Payer: Self-pay | Admitting: Family Medicine

## 2021-04-19 ENCOUNTER — Other Ambulatory Visit: Payer: Self-pay | Admitting: Family Medicine

## 2021-04-19 VITALS — Ht 72.0 in

## 2021-04-19 DIAGNOSIS — R059 Cough, unspecified: Secondary | ICD-10-CM

## 2021-04-19 DIAGNOSIS — J069 Acute upper respiratory infection, unspecified: Secondary | ICD-10-CM | POA: Diagnosis not present

## 2021-04-19 MED ORDER — HYDROCODONE BIT-HOMATROP MBR 5-1.5 MG/5ML PO SOLN: 5.0000 mL | Freq: Two times a day (BID) | ORAL | 0 refills | Status: AC | PRN

## 2021-04-19 MED ORDER — BENZONATATE 100 MG PO CAPS: 100.0000 mg | ORAL_CAPSULE | Freq: Two times a day (BID) | ORAL | 0 refills | Status: AC | PRN

## 2021-04-19 NOTE — Progress Notes (Signed)
Virtual Visit via Video Note I connected with [name] [name] on 04/19/21 by a video enabled telemedicine application and verified that I am speaking with the correct person using two identifiers.  Location patient: home Location provider:work office Persons participating in the virtual visit: patient,wife, provider  I discussed the limitations of evaluation and management by telemedicine and the availability of in person appointments. The patient expressed understanding and agreed to proceed.  Chief Complaint  Patient presents with   Cough   Sore Throat        Chest Congestion   HPI: [name] [name] is a 88 yo male with hx of HTN,CAD,CKD III,DM II, past hx of C.diff infection,and CHF c/o 11-12 days of respiratory symptoms as described above. Clear rhinorrhea, nasal congestion, sore throat, productive cough. Productive cough, worse in the morning, no hemoptysis. "Little" wheezing when laying down, "not bad." He has had wheezing in the past, he has an albuterol inhaler at home prescribed last year. No hx of tobacco use.  Dysphonia, worse at the end of the day with prolonged talking.  Negative for fever, chills, CP,palpitation,unusual dyspnea ("all the time" and at his baseline) , orthopnea,PND,abdominal pain, nausea, vomiting, changes in bowel habits, urinary symptoms, or skin rash. He is taking Mucinex. Home COVID-19 test negative x2.  He was evaluated on 04/08/21 for cough and similar symptoms. Gradually improving.  ROS: See pertinent positives and negatives per HPI.  Past Medical History:  Diagnosis Date   (HFpEF) heart failure with preserved ejection fraction ([place])    a. 10/6765 Echo: nl systolic fxn, mild LVH, diastolic relaxation abnormality, mildly enlarged LA, mild Ao insufficiency; 01/2018 Echo: EF 55-60%, no rwma, GR2 DD, triv AI, mildly dil LA, mild inc PASP.   AMS (altered mental status) 02/03/2021   BPH (benign prostatic hyperplasia)    Cancer ([place])    skin   Cataract     Choledocholithiasis    a. 05/2020 ERCP w/ CBD stone removal and biliary sphincterotomy.   Coronary artery disease    a. 01/2005 s/p CABG x 4: LIMA-LAD, VG-Diag, VG-OM3, VG-dRCA; b. 06/2013 Cath: patent grafts; c. 09/2015 Cath: VG->Diag 100, other grafts patent. LAD 100, RCA sev dzs->Med rx; d. 01/2018 Cath: LAD 100ost/p, LCX 50, RCA 100p/d, VG->OM3 100, VG->Diag 100, LIMA->LAD ok, VG->RPAV 44m-->Med rx.   COVID-19    07/2020 had paxlovid    Dyspnea    with exertion   Dysrhythmia    ED (erectile dysfunction)    Elevated LFTs    a. 05/2020 following CBD stone. 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   GERD (gastroesophageal reflux disease)    Hyperlipidemia    Hypertension    IDDM (insulin dependent diabetes mellitus) 2000   Osteoarthritis    PAH (pulmonary artery hypertension) ([place])    a. 01/2018 RHC: PA 51/22 (32).   Perirectal fistula    Pneumonia 12/2016   Shingles 09/04/2018   Tubular adenoma of colon 07/2012   Vertigo     Past Surgical History:  Procedure Laterality Date   BACK SURGERY     CARDIAC CATHETERIZATION  06/05/2013   [facility]    CARDIAC CATHETERIZATION N/A 09/24/2015   Procedure: Right Heart Cath and Coronary/Graft Angiography;  Surgeon: Minna Merritts, MD;  Location: [place] [facility];  Service: Cardiovascular;  Laterality: N/A;   CATARACT EXTRACTION  sept 2013   right   CATARACT EXTRACTION W/PHACO Left 03/28/2017   Procedure: CATARACT EXTRACTION PHACO AND INTRAOCULAR LENS  PLACEMENT (IOC);  Surgeon: Birder Robson, MD;  Location: [facility];  Service: Ophthalmology;  Laterality: Left;  [place] 00:42.0 AP% 15.0 CDE 6.31 Fluid Pack lot # [id number]   CHOLECYSTECTOMY  05/15/2011   Procedure: LAPAROSCOPIC CHOLECYSTECTOMY;  Surgeon: Rolm Bookbinder, MD;  Location: [place];  Service: General;  Laterality: N/A;   COLONOSCOPY W/ POLYPECTOMY     CORONARY ARTERY BYPASS GRAFT  2007   x 4   CORONARY STENT INTERVENTION N/A  12/08/2020   Procedure: CORONARY STENT INTERVENTION;  Surgeon: Nelva Bush, MD;  Location: [place] [facility];  Service: Cardiovascular;  Laterality: N/A;   ERCP N/A 05/05/2020   Procedure: ENDOSCOPIC RETROGRADE CHOLANGIOPANCREATOGRAPHY (ERCP);  Surgeon: Lucilla Lame, MD;  Location: [facility];  Service: Endoscopy;  Laterality: N/A;   ERCP N/A 05/12/2020   Procedure: ENDOSCOPIC RETROGRADE CHOLANGIOPANCREATOGRAPHY (ERCP);  Surgeon: Lucilla Lame, MD;  Location: [facility] ENDOSCOPY;  Service: Endoscopy;  Laterality: N/A;   ESOPHAGOGASTRODUODENOSCOPY N/A 05/12/2020   Procedure: ESOPHAGOGASTRODUODENOSCOPY (EGD);  Surgeon: Lucilla Lame, MD;  Location: [facility] ENDOSCOPY;  Service: Endoscopy;  Laterality: N/A;   EYE SURGERY     FOOT SURGERY  2012   right foot   JOINT REPLACEMENT  8/10   Right THR--[name]   LEFT AND RIGHT HEART CATHETERIZATION WITH CORONARY/GRAFT ANGIOGRAM N/A 06/05/2013   Procedure: LEFT AND RIGHT HEART CATHETERIZATION WITH Beatrix Fetters;  Surgeon: Peter M Martinique, MD;  Location: [facility] CATH LAB;  Service: Cardiovascular;  Laterality: N/A;   LUMBAR LAMINECTOMY  1989   Prostate photovaporization  5/16   Dr Budd Palmer   RIGHT/LEFT HEART CATH AND CORONARY/GRAFT ANGIOGRAPHY N/A 01/05/2018   Procedure: RIGHT/LEFT HEART CATH AND CORONARY/GRAFT ANGIOGRAPHY;  Surgeon: Minna Merritts, MD;  Location: [place] [facility];  Service: Cardiovascular;  Laterality: N/A;   RIGHT/LEFT HEART CATH AND CORONARY/GRAFT ANGIOGRAPHY Bilateral 12/08/2020   Procedure: RIGHT/LEFT HEART CATH AND CORONARY/GRAFT ANGIOGRAPHY;  Surgeon: Nelva Bush, MD;  Location: [place] [facility];  Service: Cardiovascular;  Laterality: Bilateral;    Family History  Problem Relation Age of Onset   Lung cancer Mother    Heart disease Father    Colon cancer Neg Hx    Breast cancer Neg Hx     Social History   Socioeconomic History   Marital status: Married    Spouse name: [name] [name]   Number of children: 4    Years of education: Not on file   Highest education level: Not on file  Occupational History   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  Tobacco Use   Smoking status: Never   Smokeless tobacco: Never  Vaping Use   Vaping Use: Never used  Substance and Sexual Activity   Alcohol use: Yes    Comment: occ cocktail   Drug use: No   Sexual activity: Not Currently  Other Topics Concern   Not on file  Social History Narrative   Goes by Rayne   Has living will    Has designated [employer] as health care POA   Would accept resuscitation attempts   Would not want tube feeds if cognitively unaware   Social Determinants of Health   Financial Resource Strain: Low Risk    Difficulty of Paying Living Expenses: Not hard at all  Food Insecurity: No Food Insecurity   Worried About Charity fundraiser in the Last Year: Never true   [place] in the Last Year: Never true  Transportation Needs: No Transportation Needs   Lack of Transportation (Medical):  No   Lack of Transportation (Non-Medical): No  Physical Activity: Sufficiently Active   Days of Exercise per Week: 4 days   Minutes of Exercise per Session: 40 min  Stress: No Stress Concern Present   Feeling of Stress : Not at all  Social Connections: Unknown   Frequency of Communication with Friends and Family: Not on file   Frequency of Social Gatherings with Friends and Family: Not on file   Attends Religious Services: Not on Electrical engineer or Organizations: Not on file   Attends Archivist Meetings: Not on file   Marital Status: Married  Human resources officer Violence: Not At Risk   Fear of Current or Ex-Partner: No   Emotionally Abused: No   Physically Abused: No   Sexually Abused: No   Current Outpatient Medications:    aspirin EC 81 MG tablet, Take 81 mg by mouth every other day. In the morning, Disp: , Rfl:    BD PEN NEEDLE NANO U/F 32G X 4 MM MISC, USE AS  DIRECTED, Disp: 100 each, Rfl: 0   carvedilol (COREG) 12.5 MG tablet, TAKE ONE (1) TABLET BY MOUTH TWO TIMES PER DAY, Disp: 180 tablet, Rfl: 0   Cholecalciferol (VITAMIN D3) 50 MCG (2000 UT) TABS, Take 2,000 Units by mouth in the morning., Disp: , Rfl:    doxazosin (CARDURA) 2 MG tablet, TAKE ONE (1) TABLET BY MOUTH TWO TIMES PER DAY, Disp: 60 tablet, Rfl: 3   finasteride (PROSCAR) 5 MG tablet, Take 1 tablet (5 mg total) by mouth daily. (Patient taking differently: Take 5 mg by mouth every evening.), Disp: 90 tablet, Rfl: 1   gabapentin (NEURONTIN) 100 MG capsule, TAKE 1 CAPSULE BY MOUTH 3 TIMES DAILY, Disp: 90 capsule, Rfl: 1   hydrALAZINE (APRESOLINE) 25 MG tablet, Take 25 mg by mouth in the morning and at bedtime., Disp: , Rfl:    isosorbide mononitrate (IMDUR) 30 MG 24 hr tablet, Take 1 tablet (30 mg total) by mouth daily., Disp: 30 tablet, Rfl: 6   lidocaine (LIDODERM) 5 %, Place 1 patch onto the skin daily. Remove & Discard patch within 12 hours or as directed by MD, Disp: 30 patch, Rfl: 0   loperamide (IMODIUM A-D) 2 MG tablet, Take 1 tablet (2 mg total) by mouth 2 (two) times daily as needed for up to 60 doses for diarrhea or loose stools. (Patient taking differently: Take 2 mg by mouth in the morning.), Disp: 60 tablet, Rfl: 0   Multiple Vitamins-Minerals (PRESERVISION AREDS 2 PO), Take 1 tablet by mouth in the morning and at bedtime., Disp: , Rfl:    omeprazole (PRILOSEC) 20 MG capsule, TAKE 1 CAPSULE BY MOUTH TWICE DAILY, Disp: 60 capsule, Rfl: 1   potassium chloride (KLOR-CON) 10 MEQ tablet, Take 1 tablet (10 mEq total) by mouth daily as needed. Take with torsemide, Disp: 90 tablet, Rfl: 3   rosuvastatin (CRESTOR) 20 MG tablet, Take 1 tablet (20 mg total) by mouth daily., Disp: 90 tablet, Rfl: 3   torsemide (DEMADEX) 20 MG tablet, For weight 210 to 215 take torsemide 20 mg with potassium 10 meq. For weight > Torsemide 20 mg twice a day  (8 am and 2 pm) With potassium 20 meq, Disp: 200  tablet, Rfl: 3   TOUJEO SOLOSTAR 300 UNIT/ML Solostar Pen, INJECT 50 UNITS DAILY MAY INCREASE 2U EVERY 3 DAYS UNTIL FBG IS AT GOAL UP TO 80 UNITS DAILY (Patient taking differently: Inject 41 Units into the  skin in the morning.), Disp: 22.5 mL, Rfl: 6   valsartan (DIOVAN) 320 MG tablet, Take 1 tablet (320 mg total) by mouth daily., Disp: 90 tablet, Rfl: 3   ezetimibe (ZETIA) 10 MG tablet, Take 1 tablet (10 mg total) by mouth daily., Disp: 90 tablet, Rfl: 3  EXAM:  VITALS per patient if applicable:Ht 6' (5.170 m)    BMI 29.97 kg/m   GENERAL: alert, oriented, appears well and in no acute distress  HEENT: atraumatic, conjunctiva clear, no obvious abnormalities on inspection of external nose and ears Mild dysphonia.  NECK: normal movements of the head and neck  LUNGS: on inspection no signs of respiratory distress, breathing rate appears normal, no obvious gross SOB, gasping or wheezing.  CV: no obvious cyanosis  MS: moves all visible extremities without noticeable abnormality  PSYCH/NEURO: pleasant and cooperative, no obvious depression or anxiety, speech and thought processing grossly intact  ASSESSMENT AND PLAN:  Discussed the following assessment and plan:  Cough, unspecified type  Explained that cough and congestion can last a few more days and even weeks after acute symptoms have resolved. States that he has tried codeine syrup in the past, which has been effective to treat cough, I am recommending Hycodan instead to take at bedtime and Benzonatate to take during the day. Discussed some side effects. Adequate hydration. Continue plain Mucinex. Because reporting "little" wheezing at bedtime, recommend to use Albuterol inh 1-2 puff q 6 hours as needed. Instructed about warning signs. Chest x-ray was ordered.  - Plan: DG Chest 2 View, HYDROcodone bit-homatropine (HYCODAN) 5-1.5 MG/5ML syrup, benzonatate (TESSALON) 100 MG capsule  Viral upper respiratory infection Explained  that symptoms suggest a viral illness and now having residual symptoms. I do not think abx treatment is needed at this time and given his hx of c. Diff infection we have to be cautious. Adequate hydration. Voice rest to help with dysphonia.  Monitor for fever or worsening signs. He voices understanding and agrees with plan.  We discussed possible serious and likely etiologies, options for evaluation and workup, limitations of telemedicine visit vs in person visit, treatment, treatment risks and precautions. The patient was advised to call back or seek an in-person evaluation if the symptoms worsen or if the condition fails to improve as anticipated. I discussed the assessment and treatment plan with the patient. Mr marxen and his wife were provided an opportunity to ask questions and all were answered. He agreed with the plan and demonstrated an understanding of the instructions.  Return if symptoms worsen or fail to improve.  Betty G. Martinique, MD  [facility]. [place] office.

## 2021-04-20 ENCOUNTER — Ambulatory Visit (INDEPENDENT_AMBULATORY_CARE_PROVIDER_SITE_OTHER): Payer: Medicare Other

## 2021-04-20 ENCOUNTER — Other Ambulatory Visit: Payer: Medicare Other

## 2021-04-20 ENCOUNTER — Other Ambulatory Visit: Payer: Self-pay

## 2021-04-20 DIAGNOSIS — R059 Cough, unspecified: Secondary | ICD-10-CM

## 2021-04-21 ENCOUNTER — Other Ambulatory Visit: Payer: Self-pay | Admitting: Family Medicine

## 2021-04-28 ENCOUNTER — Other Ambulatory Visit: Payer: Self-pay

## 2021-04-28 ENCOUNTER — Other Ambulatory Visit (INDEPENDENT_AMBULATORY_CARE_PROVIDER_SITE_OTHER): Payer: Medicare Other

## 2021-04-28 DIAGNOSIS — E782 Mixed hyperlipidemia: Secondary | ICD-10-CM

## 2021-04-28 DIAGNOSIS — Z79899 Other long term (current) drug therapy: Secondary | ICD-10-CM | POA: Diagnosis not present

## 2021-04-28 DIAGNOSIS — E785 Hyperlipidemia, unspecified: Secondary | ICD-10-CM | POA: Diagnosis not present

## 2021-04-29 LAB — LIPID PANEL
Chol/HDL Ratio: 1.8 ratio (ref 0.0–5.0)
Cholesterol, Total: 70 mg/dL — ABNORMAL LOW (ref 100–199)
HDL: 39 mg/dL — ABNORMAL LOW (ref >39)
LDL Chol Calc (NIH): 18 mg/dL (ref 0–99)
Triglycerides: 52 mg/dL (ref 0–149)
VLDL Cholesterol Cal: 13 mg/dL (ref 5–40)

## 2021-04-30 ENCOUNTER — Telehealth: Payer: Self-pay | Admitting: Family Medicine

## 2021-04-30 ENCOUNTER — Telehealth: Payer: Self-pay

## 2021-04-30 NOTE — Telephone Encounter (Signed)
Patient called because he is a Dr. Bonna Gains patients and states he is having diarrhea again and wants to make a appointment with Dr. Bonna Gains.Informed patient that Dr. Bonna Gains is no longer with [place] but I could get him schedule with another provider at our office. Gave him the next available which was 06/17/21 in [place]. He states he can not wait that long and he would just go to the hospital. Asked him if he wanted me to schedule him for that day and because if he called back at a later time then we would be booked out even longer. He states he does not want to schedule a appointment.

## 2021-04-30 NOTE — Telephone Encounter (Signed)
Pt called in stating for three weeks he has been having diarrhea 3 to 4 times a day. Pt wants a referral somewhere who can help him. Sent to access nurse

## 2021-05-03 ENCOUNTER — Ambulatory Visit: Payer: Medicare Other | Admitting: Nurse Practitioner

## 2021-05-03 NOTE — Telephone Encounter (Signed)
I called the patient about his symptoms and he stated that he called and scheduled a visit with a NP  today at [facility] on Friday, but  on Saturday he stopped the medication benzonatate capsule for the cough he had to see if it was the cause because it started right after and the diarrhea stopped, he has not taken the medication in 2 days and no more diarrhea, he asked me to cancel his appointment with [place] and he no longer needs a referral.  Kerstie Agent,cma

## 2021-05-03 NOTE — Telephone Encounter (Signed)
Noted  

## 2021-05-04 NOTE — Telephone Encounter (Signed)
Pt reports his diarrhea has resolved and declines appt at this time... Pt was also advised to schedule an appt to est with a new provider at this office

## 2021-05-25 ENCOUNTER — Other Ambulatory Visit: Payer: Self-pay | Admitting: Nurse Practitioner

## 2021-05-25 NOTE — Telephone Encounter (Signed)
Please advise if ok to refill historical provider. 

## 2021-05-27 ENCOUNTER — Telehealth: Payer: Self-pay | Admitting: Cardiovascular Disease

## 2021-05-27 NOTE — Telephone Encounter (Signed)
Please advise on dosage from 3 a day now changed to 2 a day ?

## 2021-05-27 NOTE — Telephone Encounter (Signed)
I called and spoke with the pharmacy staff at [facility]. ? ?They called to clarify a refill they had received for Hydralazine 25 mg BID. ? ?Per the pharmacy, the last RX they had received prior to this one was for Hydralazine 25 mg TID (08/10/20). ? ?I reviewed the patient's chart. ?Hydralazine 25 mg TID was ordered by Ignacia Bayley, NP on 08/10/20 (see phone note). ?The patient came in to see Dr. Rockey Situ on 12/04/20 and his medication list stated he was taking Hydralazine 25 mg BID. ? ?I cannot see in any phone note documentation between 08/10/20-12/04/20, where any of our providers had changed the dose of hydralazine for Mr. Hartlage from TID >> BID. ? ? ?I advised the pharmacy staff, I will need to call and clarify with the patient how he has been taking hydralazine and will call them back to clarify. ? ?Pharmacy staff voices understanding and is agreeable. ? ?I attempted to call the patient. ?No answer- I left a detailed message (ok per DPR) that we are trying to clarify how he his taking his hydralazine. ?I asked that he please call back. ? ? ?In further review of the patient's chart, the hydralazine RX was changed Epic on 10/14/20 by an Ulyess Blossom, [place]- she is not located in our clinic.  ? ? ?

## 2021-05-28 MED ORDER — HYDRALAZINE HCL 25 MG PO TABS: 25.0000 mg | ORAL_TABLET | Freq: Two times a day (BID) | ORAL | 2 refills | Status: DC

## 2021-05-28 NOTE — Telephone Encounter (Signed)
I called and spoke with the patient this morning to confirm how he is taking hydralazine. ? ?I advised the patient that his chart reflects that he is taking: ?Hydralazine 25 mg BID, however, the pharmacy how a RX on file for  ?Hydralazine 25 mg TID. ? ?Per the patient, the bottle states hydralazine 25 mg TID, but he is only taking this BID. ? ?I advised I will update his RX at the pharmacy to reflect how he is truly taking this. ? ?The patient voices understanding of the above and is agreeable. ? ?He was very appreciative of the call.  ? ?

## 2021-06-01 DIAGNOSIS — H43813 Vitreous degeneration, bilateral: Secondary | ICD-10-CM | POA: Diagnosis not present

## 2021-06-01 DIAGNOSIS — H353211 Exudative age-related macular degeneration, right eye, with active choroidal neovascularization: Secondary | ICD-10-CM | POA: Diagnosis not present

## 2021-06-01 DIAGNOSIS — H43391 Other vitreous opacities, right eye: Secondary | ICD-10-CM | POA: Diagnosis not present

## 2021-06-01 DIAGNOSIS — H353121 Nonexudative age-related macular degeneration, left eye, early dry stage: Secondary | ICD-10-CM | POA: Diagnosis not present

## 2021-06-02 ENCOUNTER — Telehealth: Payer: Self-pay | Admitting: Family Medicine

## 2021-06-02 DIAGNOSIS — E118 Type 2 diabetes mellitus with unspecified complications: Secondary | ICD-10-CM

## 2021-06-02 NOTE — Telephone Encounter (Signed)
Pt called in requesting for lab orders for A1C... No lab orders in system.... Pt requesting callback  ?

## 2021-06-02 NOTE — Telephone Encounter (Signed)
POC A1c ordered. He can be scheduled for a nurse visit for this. Please see if there is a particular reason he is requesting this. Thanks.  ?

## 2021-06-03 ENCOUNTER — Telehealth: Payer: Self-pay | Admitting: Internal Medicine

## 2021-06-03 ENCOUNTER — Ambulatory Visit (INDEPENDENT_AMBULATORY_CARE_PROVIDER_SITE_OTHER): Payer: Medicare Other

## 2021-06-03 DIAGNOSIS — E118 Type 2 diabetes mellitus with unspecified complications: Secondary | ICD-10-CM | POA: Diagnosis not present

## 2021-06-03 DIAGNOSIS — E119 Type 2 diabetes mellitus without complications: Secondary | ICD-10-CM

## 2021-06-03 LAB — POCT GLYCOSYLATED HEMOGLOBIN (HGB A1C): Hemoglobin A1C: 8.2 % — AB (ref 4.0–5.6)

## 2021-06-03 NOTE — Telephone Encounter (Signed)
See other note

## 2021-06-03 NOTE — Telephone Encounter (Signed)
I called and spoke with the patient and he stated he normally checks his a1c every 6 months, he is scheduled today for a nurse visit to have a poc a1c today.  Kammie Scioli,cma  ?

## 2021-06-03 NOTE — Progress Notes (Signed)
Pt arrived to office for POCT Hgb A1c per order from telephone encounter on 06/02/21. Pt tolerated finger stick well w/o any signs of distress nor voiced any concerns.  ? ?Pt was instructed that we would f/u once results has been reviewed by PCP. Pt verbalized understanding.  ?

## 2021-06-03 NOTE — Telephone Encounter (Signed)
A1c 8.2 needs f/u with PCP ?

## 2021-06-04 ENCOUNTER — Encounter: Payer: Self-pay | Admitting: Family Medicine

## 2021-06-04 ENCOUNTER — Other Ambulatory Visit: Payer: Self-pay | Admitting: Family Medicine

## 2021-06-04 ENCOUNTER — Ambulatory Visit (INDEPENDENT_AMBULATORY_CARE_PROVIDER_SITE_OTHER): Payer: Medicare Other | Admitting: Family Medicine

## 2021-06-04 DIAGNOSIS — I2510 Atherosclerotic heart disease of native coronary artery without angina pectoris: Secondary | ICD-10-CM

## 2021-06-04 DIAGNOSIS — E118 Type 2 diabetes mellitus with unspecified complications: Secondary | ICD-10-CM | POA: Diagnosis not present

## 2021-06-04 MED ORDER — DAPAGLIFLOZIN PROPANEDIOL 10 MG PO TABS: 10.0000 mg | ORAL_TABLET | Freq: Every day | ORAL | 1 refills | Status: DC

## 2021-06-04 NOTE — Patient Instructions (Signed)
Nice to see you. ?We will start [place] for your diabetes.  ?If you notice any side effects with this please stop the medication and let us know.  ?

## 2021-06-04 NOTE — Progress Notes (Signed)
?[name] Rumps, MD ?Phone: [phone] ? ?[name] [name] is a 88 y.o. male who presents today for f/u. ? ?DIABETES ?Disease Monitoring: ?Blood Sugar ranges-83-153 Polyuria/phagia/dipsia- no      Optho- UTD ?Medications: ?Compliance- taking toujeo 40 u daily Hypoglycemic symptoms- no ?The patient saw endocrinology a little over a year ago.  They prescribed Wilder Glade though he never picked this up as he was uncomfortable with the potential side effects.  He has been on metformin though had GI issues with this.  His wife reports he eats sweets and does not eat terribly healthy meals.  He does not eat large volumes of food. ? ?CAD:  Patient reports chronic dyspnea on exertion.  It limits his exercise.  He had a left and right heart cath that revealed severe native CAD with chronic total occlusions of the ostial LAD and mid RCA.  There was also moderate to severe diffuse disease involving the mid/distal LAD beyond the LIMA anastomosis as well as the ostial through mid left circumflex.  He had a widely patent LIMA to LAD graft.  He had a severely diseased SVG to PDA with sequential 99 and 50% mid graft stenosis with TIMI I flow.  He had chronically occluded SVG to D1 and SVG to OM.  He has an appointment with cardiology at [facility] to discuss additional options for treatment.  He notes he has been taking torsemide intermittently and his wife feels as though those helped minimally with his breathing. ? ? ?Social History  ? ?Tobacco Use  ?Smoking Status Never  ?Smokeless Tobacco Never  ? ? ?Current Outpatient Medications on File Prior to Visit  ?Medication Sig Dispense Refill  ? aspirin EC 81 MG tablet Take 81 mg by mouth every other day. In the morning    ? BD PEN NEEDLE NANO U/F 32G X 4 MM MISC USE AS DIRECTED 100 each 0  ? carvedilol (COREG) 12.5 MG tablet TAKE ONE (1) TABLET BY MOUTH TWO TIMES PER DAY 180 tablet 0  ? Cholecalciferol (VITAMIN D3) 50 MCG (2000 UT) TABS Take 2,000 Units by mouth in the morning.    ?  doxazosin (CARDURA) 2 MG tablet TAKE ONE (1) TABLET BY MOUTH TWO TIMES PER DAY 60 tablet 3  ? finasteride (PROSCAR) 5 MG tablet Take 1 tablet (5 mg total) by mouth daily. (Patient taking differently: Take 5 mg by mouth every evening.) 90 tablet 1  ? gabapentin (NEURONTIN) 100 MG capsule TAKE 1 CAPSULE BY MOUTH 3 TIMES DAILY 90 capsule 1  ? hydrALAZINE (APRESOLINE) 25 MG tablet Take 1 tablet (25 mg total) by mouth in the morning and at bedtime. 180 tablet 2  ? isosorbide mononitrate (IMDUR) 30 MG 24 hr tablet Take 1 tablet (30 mg total) by mouth daily. 30 tablet 6  ? lidocaine (LIDODERM) 5 % Place 1 patch onto the skin daily. Remove & Discard patch within 12 hours or as directed by MD 30 patch 0  ? loperamide (IMODIUM A-D) 2 MG tablet Take 1 tablet (2 mg total) by mouth 2 (two) times daily as needed for up to 60 doses for diarrhea or loose stools. (Patient taking differently: Take 2 mg by mouth in the morning.) 60 tablet 0  ? Multiple Vitamins-Minerals (PRESERVISION AREDS 2 PO) Take 1 tablet by mouth in the morning and at bedtime.    ? omeprazole (PRILOSEC) 20 MG capsule TAKE 1 CAPSULE BY MOUTH TWICE DAILY 60 capsule 1  ? potassium chloride (KLOR-CON) 10 MEQ tablet Take 1 tablet (  10 mEq total) by mouth daily as needed. Take with torsemide 90 tablet 3  ? rosuvastatin (CRESTOR) 20 MG tablet Take 1 tablet (20 mg total) by mouth daily. 90 tablet 3  ? torsemide (DEMADEX) 20 MG tablet For weight 210 to 215 take torsemide 20 mg with potassium 10 meq. For weight > Torsemide 20 mg twice a day  (8 am and 2 pm) With potassium 20 meq 200 tablet 3  ? TOUJEO SOLOSTAR 300 UNIT/ML Solostar Pen INJECT 50 UNITS DAILY MAY INCREASE 2U EVERY 3 DAYS UNTIL FBG IS AT GOAL UP TO 80 UNITS DAILY (Patient taking differently: Inject 41 Units into the skin in the morning.) 22.5 mL 6  ? valsartan (DIOVAN) 320 MG tablet Take 1 tablet (320 mg total) by mouth daily. 90 tablet 3  ? ezetimibe (ZETIA) 10 MG tablet Take 1 tablet (10 mg total) by mouth  daily. 90 tablet 3  ? ?No current facility-administered medications on file prior to visit.  ? ? ? ?ROS see history of present illness ? ?Objective ? ?Physical Exam ?Vitals:  ? 06/04/21 1403  ?BP: 140/70  ?Pulse: 66  ?Temp: 97.8 ?F (36.6 ?C)  ?SpO2: 97%  ? ? ?BP Readings from Last 3 Encounters:  ?06/04/21 140/70  ?02/12/21 140/60  ?02/03/21 (!) 147/55  ? ?Wt Readings from Last 3 Encounters:  ?06/04/21 227 lb 12.8 oz (103.3 kg)  ?04/08/21 221 lb (100.2 kg)  ?02/12/21 221 lb 9.6 oz (100.5 kg)  ? ? ?Physical Exam ?Constitutional:   ?   General: He is not in acute distress. ?   Appearance: He is not diaphoretic.  ?Cardiovascular:  ?   Rate and Rhythm: Normal rate and regular rhythm.  ?   Heart sounds: Normal heart sounds.  ?Pulmonary:  ?   Effort: Pulmonary effort is normal.  ?   Breath sounds: Normal breath sounds.  ?Skin: ?   General: Skin is warm and dry.  ?Neurological:  ?   Mental Status: He is alert.  ? ? ? ?Assessment/Plan: Please see individual problem list. ? ?Problem List Items Addressed This Visit   ? ? CAD (coronary artery disease) (Chronic)  ?  Chronic dyspnea on exertion.  Currently he is undergoing medical management.  He will keep his upcoming appointment.  He will continue carvedilol 12.5 mg twice daily, Imdur 30 mg daily, hydralazine 25 mg twice daily, Crestor 20 mg daily, Zetia 10 mg daily, and valsartan 320 mg daily. ?  ?  ? Type 2 diabetes mellitus with complication, without long-term current use of insulin (HCC) (Chronic)  ?  Uncontrolled.  We will try adding Farxiga 10 mg once daily.  He has chronic kidney disease and cardiovascular disease so there will be benefits to those organ systems as well with the [place].  Discussed sick day rules.  Discussed potential side effects including increased urination, UTI, and genital yeast infections.  Advised if he develops any other side effects he will let us know.  He will continue Toujeo 40 units once daily.  He will continue to monitor sugars.  If he  starts having glucose less than 70 he will let us know. ?  ?  ? Relevant Medications  ? dapagliflozin propanediol (FARXIGA) 10 MG TABS tablet  ? ? ?Return for as scheduled. ? ?This visit occurred during the SARS-CoV-2 public health emergency.  Safety protocols were in place, including screening questions prior to the visit, additional usage of staff PPE, and extensive cleaning of exam room while observing appropriate  contact time as indicated for disinfecting solutions.  ? ? ?[name] Rumps, MD ?[place] ? ?

## 2021-06-04 NOTE — Assessment & Plan Note (Addendum)
Uncontrolled.  We will try adding Farxiga 10 mg once daily.  He has chronic kidney disease and cardiovascular disease so there will be benefits to those organ systems as well with the [place].  Discussed sick day rules.  Discussed potential side effects including increased urination, UTI, and genital yeast infections.  Advised if he develops any other side effects he will let us know.  He will continue Toujeo 40 units once daily.  He will continue to monitor sugars.  If he starts having glucose less than 70 he will let us know. ?

## 2021-06-04 NOTE — Assessment & Plan Note (Signed)
Chronic dyspnea on exertion.  Currently he is undergoing medical management.  He will keep his upcoming appointment.  He will continue carvedilol 12.5 mg twice daily, Imdur 30 mg daily, hydralazine 25 mg twice daily, Crestor 20 mg daily, Zetia 10 mg daily, and valsartan 320 mg daily. ?

## 2021-06-09 ENCOUNTER — Encounter: Payer: Self-pay | Admitting: Family Medicine

## 2021-06-09 ENCOUNTER — Ambulatory Visit (INDEPENDENT_AMBULATORY_CARE_PROVIDER_SITE_OTHER): Payer: Medicare Other | Admitting: Family Medicine

## 2021-06-09 VITALS — BP 120/60 | HR 100 | Temp 98.7°F | Ht 72.0 in | Wt 227.4 lb

## 2021-06-09 DIAGNOSIS — M7989 Other specified soft tissue disorders: Secondary | ICD-10-CM | POA: Diagnosis not present

## 2021-06-09 MED ORDER — TRAMADOL HCL 50 MG PO TABS: 50.0000 mg | ORAL_TABLET | Freq: Three times a day (TID) | ORAL | 0 refills | Status: AC | PRN

## 2021-06-09 NOTE — Progress Notes (Signed)
?[name], MD ?Phone: [phone] ? ?[name] [name] is a 88 y.o. male who presents today for same-day visit. ? ?Left leg pain and swelling: Patient notes this started a week or so ago.  He had pain in his distal hamstring.  He has had trouble walking since then.  Notes his left leg has been swelling.  His wife has noted a bruise in the distal hamstring.  He notes no injury.  He reports a couple weeks ago he had a pinpoint sharp discomfort in his chest along the left sternal border.  Notes he took an aspirin and that resolved.  No history of blood clot.  He does report history of cramps and notes he stopped his cholesterol medication 3 to 4 days ago to see if that would help with the cramping.  He has not had any cramps since then.  He notes no changes chronic dyspnea. ? ?Social History  ? ?Tobacco Use  ?Smoking Status Never  ?Smokeless Tobacco Never  ? ? ?Current Outpatient Medications on File Prior to Visit  ?Medication Sig Dispense Refill  ? aspirin EC 81 MG tablet Take 81 mg by mouth every other day. In the morning    ? BD PEN NEEDLE NANO U/F 32G X 4 MM MISC USE AS DIRECTED 100 each 0  ? carvedilol (COREG) 12.5 MG tablet TAKE ONE (1) TABLET BY MOUTH TWO TIMES PER DAY 180 tablet 0  ? Cholecalciferol (VITAMIN D3) 50 MCG (2000 UT) TABS Take 2,000 Units by mouth in the morning.    ? dapagliflozin propanediol (FARXIGA) 10 MG TABS tablet Take 1 tablet (10 mg total) by mouth daily before breakfast. 90 tablet 1  ? doxazosin (CARDURA) 2 MG tablet TAKE ONE (1) TABLET BY MOUTH TWO TIMES PER DAY 60 tablet 3  ? finasteride (PROSCAR) 5 MG tablet Take 1 tablet (5 mg total) by mouth daily. (Patient taking differently: Take 5 mg by mouth every evening.) 90 tablet 1  ? gabapentin (NEURONTIN) 100 MG capsule TAKE 1 CAPSULE BY MOUTH 3 TIMES DAILY 90 capsule 1  ? hydrALAZINE (APRESOLINE) 25 MG tablet Take 1 tablet (25 mg total) by mouth in the morning and at bedtime. 180 tablet 2  ? isosorbide mononitrate (IMDUR) 30 MG 24  hr tablet Take 1 tablet (30 mg total) by mouth daily. 30 tablet 6  ? lidocaine (LIDODERM) 5 % Place 1 patch onto the skin daily. Remove & Discard patch within 12 hours or as directed by MD 30 patch 0  ? loperamide (IMODIUM A-D) 2 MG tablet Take 1 tablet (2 mg total) by mouth 2 (two) times daily as needed for up to 60 doses for diarrhea or loose stools. (Patient taking differently: Take 2 mg by mouth in the morning.) 60 tablet 0  ? Multiple Vitamins-Minerals (PRESERVISION AREDS 2 PO) Take 1 tablet by mouth in the morning and at bedtime.    ? omeprazole (PRILOSEC) 20 MG capsule TAKE 1 CAPSULE BY MOUTH TWICE DAILY 60 capsule 1  ? potassium chloride (KLOR-CON) 10 MEQ tablet Take 1 tablet (10 mEq total) by mouth daily as needed. Take with torsemide 90 tablet 3  ? rosuvastatin (CRESTOR) 20 MG tablet Take 1 tablet (20 mg total) by mouth daily. 90 tablet 3  ? torsemide (DEMADEX) 20 MG tablet For weight 210 to 215 take torsemide 20 mg with potassium 10 meq. For weight > Torsemide 20 mg twice a day  (8 am and 2 pm) With potassium 20 meq 200 tablet 3  ?  TOUJEO SOLOSTAR 300 UNIT/ML Solostar Pen INJECT 50 UNITS DAILY MAY INCREASE 2U EVERY 3 DAYS UNTIL FBG IS AT GOAL UP TO 80 UNITS DAILY (Patient taking differently: Inject 41 Units into the skin in the morning.) 22.5 mL 6  ? valsartan (DIOVAN) 320 MG tablet Take 1 tablet (320 mg total) by mouth daily. 90 tablet 3  ? ezetimibe (ZETIA) 10 MG tablet Take 1 tablet (10 mg total) by mouth daily. 90 tablet 3  ? ?No current facility-administered medications on file prior to visit.  ? ? ? ?ROS see history of present illness ? ?Objective ? ?Physical Exam ?Vitals:  ? 06/09/21 1524  ?BP: 120/60  ?Pulse: 100  ?Temp: 98.7 ?F (37.1 ?C)  ?SpO2: 94%  ? ? ?BP Readings from Last 3 Encounters:  ?06/09/21 120/60  ?06/04/21 140/70  ?02/12/21 140/60  ? ?Wt Readings from Last 3 Encounters:  ?06/09/21 227 lb 6.4 oz (103.1 kg)  ?06/04/21 227 lb 12.8 oz (103.3 kg)  ?04/08/21 221 lb (100.2 kg)   ? ? ?Physical Exam ?Musculoskeletal:  ?   Comments: Bruise over his left distal hamstring, there is tenderness in this area, there is tenderness in his calf, there is swelling in the left leg, no swelling in the right leg  ? ? ? ?Assessment/Plan: Please see individual problem list. ? ?Problem List Items Addressed This Visit   ? ? Left leg swelling - Primary  ?  Patient has left leg swelling, bruising, and pain.  Discussed the differential including muscular injury versus DVT.  He had some sharp pinpoint chest discomfort a couple of weeks ago.  Given this history we will obtain a D-dimer.  If it is negative this would rule out a DVT and PE.  If it is positive he will need an ultrasound and imaging for a PE.  He was advised to seek medical attention immediately if he develops chest pain or shortness of breath.  We will treat his discomfort with tramadol at this time.  He denies history of seizures.  He will will not drive if he is drowsy on the tramadol. ?  ?  ? Relevant Orders  ? D-Dimer, Quantitative  ? ? ? ?Return if symptoms worsen or fail to improve. ? ?This visit occurred during the SARS-CoV-2 public health emergency.  Safety protocols were in place, including screening questions prior to the visit, additional usage of staff PPE, and extensive cleaning of exam room while observing appropriate contact time as indicated for disinfecting solutions.  ? ? ?[name], MD ?[place] ? ?

## 2021-06-09 NOTE — Patient Instructions (Signed)
Nice to see you. ?We will check a lab today and contact you with the results. ?Please take the tramadol for your pain.  If you get drowsy with this please do not drive. ?If you develop shortness of breath or chest pain please go to the emergency room. ?

## 2021-06-09 NOTE — Assessment & Plan Note (Signed)
Patient has left leg swelling, bruising, and pain.  Discussed the differential including muscular injury versus DVT.  He had some sharp pinpoint chest discomfort a couple of weeks ago.  Given this history we will obtain a D-dimer.  If it is negative this would rule out a DVT and PE.  If it is positive he will need an ultrasound and imaging for a PE.  He was advised to seek medical attention immediately if he develops chest pain or shortness of breath.  We will treat his discomfort with tramadol at this time.  He denies history of seizures.  He will will not drive if he is drowsy on the tramadol. ?

## 2021-06-10 LAB — D-DIMER, QUANTITATIVE: D-Dimer, Quant: 0.45 mcg/mL FEU (ref <0.50)

## 2021-06-15 ENCOUNTER — Other Ambulatory Visit: Payer: Self-pay | Admitting: Nurse Practitioner

## 2021-06-17 DIAGNOSIS — E119 Type 2 diabetes mellitus without complications: Secondary | ICD-10-CM | POA: Diagnosis not present

## 2021-06-17 DIAGNOSIS — Z951 Presence of aortocoronary bypass graft: Secondary | ICD-10-CM | POA: Diagnosis not present

## 2021-06-17 DIAGNOSIS — E782 Mixed hyperlipidemia: Secondary | ICD-10-CM | POA: Diagnosis not present

## 2021-06-17 DIAGNOSIS — I10 Essential (primary) hypertension: Secondary | ICD-10-CM | POA: Diagnosis not present

## 2021-06-17 DIAGNOSIS — I4891 Unspecified atrial fibrillation: Secondary | ICD-10-CM | POA: Diagnosis not present

## 2021-06-17 DIAGNOSIS — I509 Heart failure, unspecified: Secondary | ICD-10-CM | POA: Diagnosis not present

## 2021-06-17 DIAGNOSIS — I2510 Atherosclerotic heart disease of native coronary artery without angina pectoris: Secondary | ICD-10-CM | POA: Diagnosis not present

## 2021-06-17 DIAGNOSIS — I5032 Chronic diastolic (congestive) heart failure: Secondary | ICD-10-CM | POA: Diagnosis not present

## 2021-06-18 ENCOUNTER — Telehealth: Payer: Self-pay | Admitting: Family Medicine

## 2021-06-18 NOTE — Telephone Encounter (Signed)
Patient called and would like Dr [name] to complete a handicapp renewal placard for him. ?

## 2021-06-19 ENCOUNTER — Other Ambulatory Visit: Payer: Self-pay | Admitting: Family Medicine

## 2021-06-21 NOTE — Telephone Encounter (Signed)
I called and asked the patient if he could walk 200 ft or more without stopping and he stated he could not, he also stated he uses a walker and a cane to walk.  This handicap form was placed in the signed basket for you to sign.  Alyzza Andringa,cma  ?

## 2021-06-22 NOTE — Telephone Encounter (Signed)
Signed.

## 2021-06-22 NOTE — Telephone Encounter (Signed)
I called and informed the patient that his handicap form is available at the front for pickup and he understood.  Aleia Larocca,cma  ?

## 2021-07-06 ENCOUNTER — Other Ambulatory Visit: Payer: Self-pay | Admitting: Cardiovascular Disease

## 2021-07-07 ENCOUNTER — Ambulatory Visit (INDEPENDENT_AMBULATORY_CARE_PROVIDER_SITE_OTHER): Payer: Medicare Other

## 2021-07-07 ENCOUNTER — Telehealth: Payer: Self-pay | Admitting: Cardiovascular Disease

## 2021-07-07 VITALS — Ht 73.0 in | Wt 227.0 lb

## 2021-07-07 DIAGNOSIS — Z0000 Encounter for general adult medical examination without abnormal findings: Secondary | ICD-10-CM | POA: Diagnosis not present

## 2021-07-07 NOTE — Telephone Encounter (Signed)
Pt would like for nurse to give him a call. Please advise ?

## 2021-07-07 NOTE — Progress Notes (Signed)
Subjective:   [name] [name] is a 88 y.o. male who presents for Medicare Annual/Subsequent preventive examination.  Review of Systems    No ROS.  Medicare Wellness Virtual Visit.  Visual/audio telehealth visit, UTA vital signs.   See social history for additional risk factors.   Cardiac Risk Factors include: advanced age (>18men, >17 women);male gender;hypertension;diabetes mellitus     Objective:    Today's Vitals   07/07/21 1419  Weight: 227 lb (103 kg)  Height: 6\' 1"  (1.854 m)   Body mass index is 29.95 kg/m.     07/07/2021    2:31 PM 02/03/2021    3:01 AM 07/03/2020   11:28 AM 05/12/2020   10:36 AM 05/11/2020    9:35 PM 05/11/2020    2:04 PM 04/08/2020    6:49 PM  Advanced Directives  Does Patient Have a Medical Advance Directive? Yes No Yes No  No Yes  Type of Estate agent of [place];Living will  Healthcare Power of [place];Living will    Healthcare Power of [place];Living will  Does patient want to make changes to medical advance directive? No - Patient declined  No - Patient declined    No - Patient declined  Copy of Healthcare Power of Attorney in Chart? No - copy requested  No - copy requested      Would patient like information on creating a medical advance directive?  No - Patient declined  No - Patient declined No - Patient declined No - Patient declined     Current Medications (verified) Outpatient Encounter Medications as of 07/07/2021  Medication Sig   aspirin EC 81 MG tablet Take 81 mg by mouth every other day. In the morning   BD PEN NEEDLE NANO U/F 32G X 4 MM MISC USE AS DIRECTED   carvedilol (COREG) 12.5 MG tablet TAKE ONE (1) TABLET BY MOUTH TWO TIMES PER DAY   Cholecalciferol (VITAMIN D3) 50 MCG (2000 UT) TABS Take 2,000 Units by mouth in the morning.   dapagliflozin propanediol (FARXIGA) 10 MG TABS tablet Take 1 tablet (10 mg total) by mouth daily before breakfast. (Patient not taking: Reported on 07/07/2021)   doxazosin (CARDURA) 2  MG tablet TAKE ONE (1) TABLET BY MOUTH TWO TIMES PER DAY   ezetimibe (ZETIA) 10 MG tablet Take 1 tablet (10 mg total) by mouth daily.   finasteride (PROSCAR) 5 MG tablet Take 1 tablet (5 mg total) by mouth daily. (Patient taking differently: Take 5 mg by mouth every evening.)   gabapentin (NEURONTIN) 100 MG capsule TAKE 1 CAPSULE BY MOUTH 3 TIMES DAILY   hydrALAZINE (APRESOLINE) 25 MG tablet Take 1 tablet (25 mg total) by mouth in the morning and at bedtime.   isosorbide mononitrate (IMDUR) 30 MG 24 hr tablet TAKE 1 TABLET BY MOUTH DAILY   lidocaine (LIDODERM) 5 % Place 1 patch onto the skin daily. Remove & Discard patch within 12 hours or as directed by MD   loperamide (IMODIUM A-D) 2 MG tablet Take 1 tablet (2 mg total) by mouth 2 (two) times daily as needed for up to 60 doses for diarrhea or loose stools. (Patient taking differently: Take 2 mg by mouth in the morning.)   Multiple Vitamins-Minerals (PRESERVISION AREDS 2 PO) Take 1 tablet by mouth in the morning and at bedtime.   omeprazole (PRILOSEC) 20 MG capsule TAKE 1 CAPSULE BY MOUTH TWICE DAILY   potassium chloride (KLOR-CON) 10 MEQ tablet Take 1 tablet (10 mEq total) by mouth daily  as needed. Take with torsemide   rosuvastatin (CRESTOR) 20 MG tablet Take 1 tablet (20 mg total) by mouth daily.   torsemide (DEMADEX) 20 MG tablet For weight 210 to 215 take torsemide 20 mg with potassium 10 meq. For weight > Torsemide 20 mg twice a day  (8 am and 2 pm) With potassium 20 meq   TOUJEO SOLOSTAR 300 UNIT/ML Solostar Pen INJECT 50 UNITS DAILY MAY INCREASE 2U EVERY 3 DAYS UNTIL FBG IS AT GOAL UP TO 80 UNITS DAILY (Patient taking differently: Inject 41 Units into the skin in the morning.)   valsartan (DIOVAN) 320 MG tablet Take 1 tablet (320 mg total) by mouth daily.   No facility-administered encounter medications on file as of 07/07/2021.    Allergies (verified) Cortisone and Metformin   History: Past Medical History:  Diagnosis Date   (HFpEF)  heart failure with preserved ejection fraction (HCC)    a. 06/2007 Echo: nl systolic fxn, mild LVH, diastolic relaxation abnormality, mildly enlarged LA, mild Ao insufficiency; 01/2018 Echo: EF 55-60%, no rwma, GR2 DD, triv AI, mildly dil LA, mild inc PASP.   AMS (altered mental status) 02/03/2021   BPH (benign prostatic hyperplasia)    Cancer (HCC)    skin   Cataract    Choledocholithiasis    a. 05/2020 ERCP w/ CBD stone removal and biliary sphincterotomy.   Coronary artery disease    a. 01/2005 s/p CABG x 4: LIMA-LAD, VG-Diag, VG-OM3, VG-dRCA; b. 06/2013 Cath: patent grafts; c. 09/2015 Cath: VG->Diag 100, other grafts patent. LAD 100, RCA sev dzs->Med rx; d. 01/2018 Cath: LAD 100ost/p, LCX 50, RCA 100p/d, VG->OM3 100, VG->Diag 100, LIMA->LAD ok, VG->RPAV 38m-->Med rx.   COVID-19    07/2020 had paxlovid    Dyspnea    with exertion   Dysrhythmia    ED (erectile dysfunction)    Elevated LFTs    a. 05/2020 following CBD stone. 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   GERD (gastroesophageal reflux disease)    Hyperlipidemia    Hypertension    IDDM (insulin dependent diabetes mellitus) 2000   Osteoarthritis    PAH (pulmonary artery hypertension) (HCC)    a. 01/2018 RHC: PA 51/22 (32).   Perirectal fistula    Pneumonia 12/2016   Shingles 09/04/2018   Tubular adenoma of colon 07/2012   Vertigo    Past Surgical History:  Procedure Laterality Date   BACK SURGERY     CARDIAC CATHETERIZATION  06/05/2013   [facility].    CARDIAC CATHETERIZATION N/A 09/24/2015   Procedure: Right Heart Cath and Coronary/Graft Angiography;  Surgeon: Antonieta Iba, MD;  Location: [facility] INVASIVE CV LAB;  Service: Cardiovascular;  Laterality: N/A;   CATARACT EXTRACTION  sept 2013   right   CATARACT EXTRACTION W/PHACO Left 03/28/2017   Procedure: CATARACT EXTRACTION PHACO AND INTRAOCULAR LENS PLACEMENT (IOC);  Surgeon: Galen Manila, MD;  Location: [facility] ORS;  Service:  Ophthalmology;  Laterality: Left;  [place] 00:42.0 AP% 15.0 CDE 6.31 Fluid Pack lot # [id number]   CHOLECYSTECTOMY  05/15/2011   Procedure: LAPAROSCOPIC CHOLECYSTECTOMY;  Surgeon: Emelia Loron, MD;  Location: [facility] OR;  Service: General;  Laterality: N/A;   COLONOSCOPY W/ POLYPECTOMY     CORONARY ARTERY BYPASS GRAFT  2007   x 4   CORONARY STENT INTERVENTION N/A 12/08/2020   Procedure: CORONARY STENT INTERVENTION;  Surgeon: Yvonne Kendall, MD;  Location: [facility] INVASIVE CV LAB;  Service: Cardiovascular;  Laterality: N/A;   ERCP  N/A 05/05/2020   Procedure: ENDOSCOPIC RETROGRADE CHOLANGIOPANCREATOGRAPHY (ERCP);  Surgeon: Midge Minium, MD;  Location: [facility] ENDOSCOPY;  Service: Endoscopy;  Laterality: N/A;   ERCP N/A 05/12/2020   Procedure: ENDOSCOPIC RETROGRADE CHOLANGIOPANCREATOGRAPHY (ERCP);  Surgeon: Midge Minium, MD;  Location: [facility] ENDOSCOPY;  Service: Endoscopy;  Laterality: N/A;   ESOPHAGOGASTRODUODENOSCOPY N/A 05/12/2020   Procedure: ESOPHAGOGASTRODUODENOSCOPY (EGD);  Surgeon: Midge Minium, MD;  Location: [facility] ENDOSCOPY;  Service: Endoscopy;  Laterality: N/A;   EYE SURGERY     FOOT SURGERY  2012   right foot   JOINT REPLACEMENT  8/10   Right THR--[name]   LEFT AND RIGHT HEART CATHETERIZATION WITH CORONARY/GRAFT ANGIOGRAM N/A 06/05/2013   Procedure: LEFT AND RIGHT HEART CATHETERIZATION WITH Isabel Caprice;  Surgeon: Peter M [place]az[place]and, MD;  Location: [facility] CATH LAB;  Service: Cardiovascular;  Laterality: N/A;   LUMBAR LAMINECTOMY  1989   Prostate photovaporization  5/16   Dr Raeanne Gathers   RIGHT/LEFT HEART CATH AND CORONARY/GRAFT ANGIOGRAPHY N/A 01/05/2018   Procedure: RIGHT/LEFT HEART CATH AND CORONARY/GRAFT ANGIOGRAPHY;  Surgeon: Antonieta Iba, MD;  Location: [facility] INVASIVE CV LAB;  Service: Cardiovascular;  Laterality: N/A;   RIGHT/LEFT HEART CATH AND CORONARY/GRAFT ANGIOGRAPHY Bilateral 12/08/2020   Procedure: RIGHT/LEFT HEART CATH AND CORONARY/GRAFT ANGIOGRAPHY;  Surgeon: Yvonne Kendall, MD;  Location: [facility] INVASIVE CV LAB;  Service: Cardiovascular;  Laterality: Bilateral;   Family History  Problem Relation Age of Onset   Lung cancer Mother    Heart disease Father    Colon cancer Neg Hx    Breast cancer Neg Hx    Social History   Socioeconomic History   Marital status: Married    Spouse name: Jan   Number of children: 4   Years of education: Not on file   Highest education level: Not on file  Occupational History   Occupation: Warden/ranger    Comment: Animator  Tobacco Use   Smoking status: Never   Smokeless tobacco: Never  Vaping Use   Vaping Use: Never used  Substance and Sexual Activity   Alcohol use: Yes    Comment: occ cocktail   Drug use: No   Sexual activity: Not Currently  Other Topics Concern   Not on file  Social History Narrative   [name]   Has living will    Has designated [employer] as health care POA   Would accept resuscitation attempts   Would not want tube feeds if cognitively unaware   Social Determinants of Health   Financial Resource Strain: Low Risk    Difficulty of Paying Living Expenses: Not hard at all  Food Insecurity: No Food Insecurity   Worried About Programme researcher, broadcasting/film/video in the Last Year: Never true   Barista in the Last Year: Never true  Transportation Needs: No Transportation Needs   Lack of Transportation (Medical): No   Lack of Transportation (Non-Medical): No  Physical Activity: Not on file  Stress: No Stress Concern Present   Feeling of Stress : Not at all  Social Connections: Unknown   Frequency of Communication with Friends and Family: Not on file   Frequency of Social Gatherings with Friends and Family: Not on file   Attends Religious Services: Not on file   Active Member of Clubs or Organizations: Not on file   Attends Banker Meetings: Not on file   Marital Status: Married    Tobacco Counseling Counseling given: Not  Answered   Clinical Intake:  Pre-visit preparation  completed: Yes        Diabetes: Yes (Followed by PCP)  How often do you need to have someone help you when you read instructions, pamphlets, or other written materials from your doctor or pharmacy?: 1 - Never    Interpreter Needed?: No    Activities of Daily Living    07/07/2021    2:37 PM 12/08/2020   12:12 PM  In your present state of health, do you have any difficulty performing the following activities:  Hearing? 0 0  Vision? 0 0  Difficulty concentrating or making decisions? 0 0  Walking or climbing stairs? 0 0  Dressing or bathing? 0 0  Doing errands, shopping? 0   Preparing Food and eating ? N   Using the Toilet? N   In the past six months, have you accidently leaked urine? N   Do you have problems with loss of bowel control? N   Managing your Medications? N   Managing your Finances? N   Housekeeping or managing your Housekeeping? N     Patient Care Team: Glori Luis, MD as PCP - General (Family Medicine) Mariah Milling Tollie Pizza, MD as Consulting Physician (Cardiology)  Indicate any recent Medical Services you may have received from other than [facility] providers in the past year (date may be approximate).     Assessment:   This is a routine wellness examination for [name] [name].  Virtual Visit via Telephone Note  I connected with  [name] [name] on 07/07/21 at  2:15 PM EDT by telephone and verified that I am speaking with the correct person using two identifiers.  Persons participating in the virtual visit: patient/Nurse Health Advisor   I discussed the limitations of performing an evaluation and management service by telehealth. The patient expressed understanding and agreed to proceed. We continued and completed visit with audio only. Some vital signs may be absent or patient reported.   Hearing/Vision screen Hearing Screening - Comments:: Patient is able to hear conversational tones without difficulty. No  issues reported. Vision Screening - Comments:: Wears corrective lenses Macular degeneration; injections They have seen their ophthalmologist in the last 12 months.   Dietary issues and exercise activities discussed: Current Exercise Habits: Home exercise routine, Intensity: Mild Low carb diet Good water intake   Goals Addressed             This Visit's Progress    Maintain Healthy Lifestyle   On track    Weight goal less than 200lb Healthy diet Stay active        Depression Screen    07/07/2021    2:29 PM 06/04/2021    2:04 PM 10/05/2020    2:00 PM 07/03/2020   11:27 AM 05/20/2020    9:34 AM 04/06/2020    9:55 AM 05/07/2019   12:16 PM  PHQ 2/9 Scores  PHQ - 2 Score 0 0 0 0 0 0 0    Fall Risk    07/07/2021    2:32 PM 06/04/2021    2:04 PM 10/05/2020    2:00 PM 08/12/2020   10:28 AM 07/03/2020   11:29 AM  Fall Risk   Falls in the past year? 0 0 0 0 0  Number falls in past yr: 0 0 0 0 0  Injury with Fall?  0 0 0 0  Risk for fall due to :  No Fall Risks     Follow up Falls evaluation completed Falls evaluation completed Falls evaluation completed Falls evaluation completed Falls  evaluation completed    FALL RISK PREVENTION PERTAINING TO THE HOME: Home free of loose throw rugs in walkways, pet beds, electrical cords, etc? Yes  Adequate lighting in your home to reduce risk of falls? Yes   ASSISTIVE DEVICES UTILIZED TO PREVENT FALLS: Life alert? No  Use of a cane, walker or w/c? No   TIMED UP AND GO: Was the test performed? No .   Cognitive Function:  Patient is alert and oriented x3.      Immunizations Immunization History  Administered Date(s) Administered   Influenza Split 12/24/2010   Influenza Whole 12/05/2012   Influenza, High Dose Seasonal PF 01/02/2015, 11/09/2016, 12/14/2017, 12/19/2018   Influenza, Seasonal, Injecte, Preservative Fre 12/11/2007   Influenza-Unspecified 12/27/2013, 01/02/2015, 12/24/2019   PFIZER(Purple Top)SARS-COV-2 Vaccination  03/28/2019, 04/18/2019, 12/16/2019   Pneumococcal Conjugate-13 02/05/2014, 01/02/2015   Pneumococcal Polysaccharide-23 03/07/2010   Td 06/13/2012   Zoster Recombinat (Shingrix) 11/13/2017, 03/04/2019   Zoster, Live 12/01/2010   Screening Tests Health Maintenance  Topic Date Due   COVID-19 Vaccine (4 - Booster for Pfizer series) 07/23/2021 (Originally 02/10/2020)   FOOT EXAM  08/27/2021   INFLUENZA VACCINE  10/05/2021   HEMOGLOBIN A1C  12/04/2021   OPHTHALMOLOGY EXAM  04/21/2022   TETANUS/TDAP  06/14/2022   Pneumonia Vaccine 55+ Years old  Completed   Zoster Vaccines- Shingrix  Completed   HPV VACCINES  Aged Out   Health Maintenance There are no preventive care reminders to display for this patient.  Lung Cancer Screening: (Low Dose CT Chest recommended if Age 35-80 years, 30 pack-year currently smoking OR have quit w/in 15years.) does not qualify.   Hepatitis C Screening: does not qualify.  Vision Screening: Recommended annual ophthalmology exams for early detection of glaucoma and other disorders of the eye.  Dental Screening: Recommended annual dental exams for proper oral hygiene  Community Resource Referral / Chronic Care Management: CRR required this visit?  No   CCM required this visit?  No      Plan:   Keep all routine maintenance appointments.    I have personally reviewed and noted the following in the patient's chart:   Medical and social history Use of alcohol, tobacco or illicit drugs  Current medications and supplements including opioid prescriptions. Patient is not currently taking opioid prescriptions. Functional ability and status Nutritional status Physical activity Advanced directives List of other physicians Hospitalizations, surgeries, and ER visits in previous 12 months Vitals Screenings to include cognitive, depression, and falls Referrals and appointments  In addition, I have reviewed and discussed with patient certain preventive  protocols, quality metrics, and best practice recommendations. A written personalized care plan for preventive services as well as general preventive health recommendations were provided to patient.     Ashok Pall, LPN   03/12/1094

## 2021-07-07 NOTE — Patient Instructions (Addendum)
?  [name] [name] , ?Thank you for taking time to come for your Medicare Wellness Visit. I appreciate your ongoing commitment to your health goals. Please review the following plan we discussed and let me know if I can assist you in the future.  ? ?These are the goals we discussed: ? Goals   ? ?  Maintain Healthy Lifestyle   ?  Weight goal less than 200lb ?Healthy diet ?Stay active ? ?  ? ?  ?  ?This is a list of the screening recommended for you and due dates:  ?Health Maintenance  ?Topic Date Due  ? COVID-19 Vaccine (4 - Booster for Pfizer series) 07/23/2021*  ? Complete foot exam   08/27/2021  ? Flu Shot  10/05/2021  ? Hemoglobin A1C  12/04/2021  ? Eye exam for diabetics  04/21/2022  ? Tetanus Vaccine  06/14/2022  ? Pneumonia Vaccine  Completed  ? Zoster (Shingles) Vaccine  Completed  ? HPV Vaccine  Aged Out  ?*Topic was postponed. The date shown is not the original due date.  ?  ?

## 2021-07-08 NOTE — Telephone Encounter (Signed)
Spoke w/ pt.  He reports that he received rx and coupon card for Farxiga at his last ov w/ Dr. Rockey Situ.   ?He brought both to 2 local pharmacies and was told that they could not honor the $0 coupon card b/c he has Medicare.  ?The cost of this med would be ~$350 and he cannot afford this.  ?He would like to know what Dr. Rockey Situ recommends he do.  ?Advised him that I will make Dr. Rockey Situ aware and call him back w/ his recommendation.  ?

## 2021-07-08 NOTE — Telephone Encounter (Signed)
Attempted to contact pt.  Left message for him to call back.  

## 2021-07-09 ENCOUNTER — Other Ambulatory Visit: Payer: Self-pay | Admitting: Family Medicine

## 2021-07-09 DIAGNOSIS — E118 Type 2 diabetes mellitus with unspecified complications: Secondary | ICD-10-CM

## 2021-07-09 NOTE — Telephone Encounter (Signed)
Minna Merritts, MD   ? ?He may need to call his insurance company to find out what medications are covered  ?If [place] is not covered will they cover Jardiance 10 mg daily?  ?We can always send it into the pharmacy and see if the price is better  ?Similar medication  ?Thx TGollan   ? ? ?Called and spoke with patient. Relayed the above to him. Patient concerned that price of Jardiance will be similar and patient states he just can't afford that on top of his other medications.  ? ?Offered to print [place] patient assistance application and leave up front for him to pick up to see if he could get approved for that. Pt stated that he would like to try that, and would pick up the application later this afternoon.  ? ?Application printed, prescriber portion filled out, signed. Left up front for patient to pick up.  ?

## 2021-07-12 ENCOUNTER — Other Ambulatory Visit: Payer: Self-pay | Admitting: Cardiovascular Disease

## 2021-07-12 ENCOUNTER — Telehealth: Payer: Self-pay | Admitting: Cardiovascular Disease

## 2021-07-12 NOTE — Telephone Encounter (Signed)
Application faxed to [employer].  ?

## 2021-07-12 NOTE — Telephone Encounter (Signed)
Patient dropped off PAF, placed in box °

## 2021-07-19 ENCOUNTER — Telehealth: Payer: Self-pay | Admitting: Cardiovascular Disease

## 2021-07-19 NOTE — Telephone Encounter (Signed)
Patient calling the office for samples of medication: ? ? ?1.  What medication and dosage are you requesting samples for? ?dapagliflozin propanediol (FARXIGA) 10 MG TABS tablet ? ?2.  Are you currently out of this medication? Took last tablet today.  ?  ?

## 2021-07-19 NOTE — Telephone Encounter (Signed)
Called patient. Patient had not heard that application for [employer] was approved, was happy to hear that. Explained that he should be getting shipment of medication later this week or early next. Told patient that I would leave samples up front to get him through until shipment comes in.  ? ?Pt verbalized understanding and voiced appreciation for all the help.  ?Medication Samples have been provided to the patient. ? ?Drug name: farxiga        ?Strength: '10mg'$          ?Qty: 14   ?LOT: [id number]   ?Exp.Date: 12/05/23 ? ?Dosing instructions: Take 1 tablet by mouth daily  ? ?The patient has been instructed regarding the correct time, dose, and frequency of taking this medication, including desired effects and most common side effects.  ? ?Suzetta Timko ?11:18 AM ?07/19/2021  ?

## 2021-07-19 NOTE — Telephone Encounter (Signed)
Incoming fax received from [employer].  ? ?Patient application has been approved for 07/14/21 through 03/06/22.  ? ?Will place in filing cabinet in medication room with application.  ?

## 2021-07-21 ENCOUNTER — Other Ambulatory Visit: Payer: Self-pay | Admitting: Family Medicine

## 2021-07-26 ENCOUNTER — Ambulatory Visit: Payer: Medicare Other | Admitting: Cardiovascular Disease

## 2021-08-04 DIAGNOSIS — H353121 Nonexudative age-related macular degeneration, left eye, early dry stage: Secondary | ICD-10-CM | POA: Diagnosis not present

## 2021-08-04 DIAGNOSIS — H43813 Vitreous degeneration, bilateral: Secondary | ICD-10-CM | POA: Diagnosis not present

## 2021-08-04 DIAGNOSIS — H43821 Vitreomacular adhesion, right eye: Secondary | ICD-10-CM | POA: Diagnosis not present

## 2021-08-04 DIAGNOSIS — H353211 Exudative age-related macular degeneration, right eye, with active choroidal neovascularization: Secondary | ICD-10-CM | POA: Diagnosis not present

## 2021-08-11 ENCOUNTER — Other Ambulatory Visit: Payer: Self-pay | Admitting: Family Medicine

## 2021-08-13 ENCOUNTER — Ambulatory Visit: Payer: Medicare Other | Admitting: Family Medicine

## 2021-09-02 ENCOUNTER — Ambulatory Visit (INDEPENDENT_AMBULATORY_CARE_PROVIDER_SITE_OTHER): Payer: Medicare Other | Admitting: Internal Medicine

## 2021-09-02 ENCOUNTER — Telehealth: Payer: Self-pay

## 2021-09-02 DIAGNOSIS — T148XXA Other injury of unspecified body region, initial encounter: Secondary | ICD-10-CM

## 2021-09-02 DIAGNOSIS — L03116 Cellulitis of left lower limb: Secondary | ICD-10-CM | POA: Diagnosis not present

## 2021-09-02 DIAGNOSIS — S81802A Unspecified open wound, left lower leg, initial encounter: Secondary | ICD-10-CM | POA: Diagnosis not present

## 2021-09-02 DIAGNOSIS — Z23 Encounter for immunization: Secondary | ICD-10-CM

## 2021-09-02 MED ORDER — DOXYCYCLINE HYCLATE 100 MG PO TABS: 100.0000 mg | ORAL_TABLET | Freq: Two times a day (BID) | ORAL | 0 refills | Status: DC

## 2021-09-02 MED ORDER — MUPIROCIN 2 % EX OINT: 1.0000 | TOPICAL_OINTMENT | Freq: Two times a day (BID) | CUTANEOUS | 0 refills | Status: DC

## 2021-09-02 NOTE — Progress Notes (Addendum)
Chief Complaint  Patient presents with   Follow-up   Walk in with wife fall Saturday at the beach with abrasions left shin, right knee and left arm with redness and burning left shin    Review of Systems  Constitutional:  Negative for weight loss.  HENT:  Negative for hearing loss.   Eyes:  Negative for blurred vision.  Respiratory:  Negative for shortness of breath.   Cardiovascular:  Negative for chest pain.  Gastrointestinal:  Negative for abdominal pain and blood in stool.  Musculoskeletal:  Negative for back pain.  Skin:  Negative for rash.  Neurological:  Negative for headaches.  Psychiatric/Behavioral:  Negative for depression.    Past Medical History:  Diagnosis Date   (HFpEF) heart failure with preserved ejection fraction ([place])    a. 06/8248 Echo: nl systolic fxn, mild LVH, diastolic relaxation abnormality, mildly enlarged LA, mild Ao insufficiency; 01/2018 Echo: EF 55-60%, no rwma, GR2 DD, triv AI, mildly dil LA, mild inc PASP.   AMS (altered mental status) 02/03/2021   BPH (benign prostatic hyperplasia)    Cancer ([place])    skin   Cataract    Choledocholithiasis    a. 05/2020 ERCP w/ CBD stone removal and biliary sphincterotomy.   Coronary artery disease    a. 01/2005 s/p CABG x 4: LIMA-LAD, VG-Diag, VG-OM3, VG-dRCA; b. 06/2013 Cath: patent grafts; c. 09/2015 Cath: VG->Diag 100, other grafts patent. LAD 100, RCA sev dzs->Med rx; d. 01/2018 Cath: LAD 100ost/p, LCX 50, RCA 100p/d, VG->OM3 100, VG->Diag 100, LIMA->LAD ok, VG->RPAV 67m->Med rx.   COVID-19    07/2020 had paxlovid    Dyspnea    with exertion   Dysrhythmia    ED (erectile dysfunction)    Elevated LFTs    a. 05/2020 following CBD stone. 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   GERD (gastroesophageal reflux disease)    Hyperlipidemia    Hypertension    IDDM (insulin dependent diabetes mellitus) 2000   Osteoarthritis    PAH (pulmonary artery hypertension) ([place]     a. 01/2018 RHC: PA 51/22 (32).   Perirectal fistula    Pneumonia 12/2016   Shingles 09/04/2018   Tubular adenoma of colon 07/2012   Vertigo    Past Surgical History:  Procedure Laterality Date   BACK SURGERY     CARDIAC CATHETERIZATION  06/05/2013   [facility]    CARDIAC CATHETERIZATION N/A 09/24/2015   Procedure: Right Heart Cath and Coronary/Graft Angiography;  Surgeon: TMinna Merritts MD;  [facility];  Service: Cardiovascular;  Laterality: N/A;   CATARACT EXTRACTION  sept 2013   right   CATARACT EXTRACTION W/PHACO Left 03/28/2017   Procedure: CATARACT EXTRACTION PHACO AND INTRAOCULAR LENS PLACEMENT (IOC);  Surgeon: PBirder Robson MD;  Location: [facility] ORS;  Service: Ophthalmology;  Laterality: Left;  [place]:42.0 AP% 15.0 CDE 6.31 Fluid Pack lot # [id number]   CHOLECYSTECTOMY  05/15/2011   Procedure: LAPAROSCOPIC CHOLECYSTECTOMY;  Surgeon: MRolm Bookbinder MD;  [place] [facility]  Service: General;  Laterality: N/A;   COLONOSCOPY W/ POLYPECTOMY     CORONARY ARTERY BYPASS GRAFT  2007   x 4   CORONARY STENT INTERVENTION N/A 12/08/2020   Procedure: CORONARY STENT INTERVENTION;  Surgeon: ENelva Bush MD;  [facility];  Service: Cardiovascular;  Laterality: N/A;   ERCP N/A 05/05/2020   Procedure: ENDOSCOPIC RETROGRADE CHOLANGIOPANCREATOGRAPHY (ERCP);  Surgeon: WLucilla Lame MD;  [facility];  Service: Endoscopy;  Laterality: N/A;   ERCP N/A 05/12/2020   Procedure: ENDOSCOPIC RETROGRADE CHOLANGIOPANCREATOGRAPHY (ERCP);  Surgeon: Lucilla Lame, MD;  Location[facility]COPY;  Service: Endoscopy;  Laterality: N/A;   ESOPHAGOGASTRODUODENOSCOPY N/A 05/12/2020   Procedure: ESOPHAGOGASTRODUODENOSCOPY (EGD);  Surgeon: Lucilla Lame, MD;  Location: [facility] ENDOSCOPY;  Service: Endoscopy;  Laterality: N/A;   EYE SURGERY     FOOT SURGERY  2012   right foot   JOINT REPLACEMENT  8/10   Right THR--[name]   LEFT AND RIGHT HEART CATHETERIZATION WITH  CORONARY/GRAFT ANGIOGRAM N/A 06/05/2013   Procedure: LEFT AND RIGHT HEART CATHETERIZATION WITH Beatrix Fetters;  Surgeon: Peter M Martinique, MD;  Location: [facility] CATH LAB;  Service: Cardiovascular;  Laterality: N/A;   LUMBAR LAMINECTOMY  1989   Prostate photovaporization  5/16   Dr Budd Palmer   RIGHT/LEFT HEART CATH AND CORONARY/GRAFT ANGIOGRAPHY N/A 01/05/2018   Procedure: RIGHT/LEFT HEART CATH AND CORONARY/GRAFT ANGIOGRAPHY;  Surgeon: Minna Merritts, MD;  Location: [place] [facility];  Service: Cardiovascular;  Laterality: N/A;   RIGHT/LEFT HEART CATH AND CORONARY/GRAFT ANGIOGRAPHY Bilateral 12/08/2020   Procedure: RIGHT/LEFT HEART CATH AND CORONARY/GRAFT ANGIOGRAPHY;  Surgeon: Nelva Bush, MD;  Location: [place] [facility];  Service: Cardiovascular;  Laterality: Bilateral;   Family History  Problem Relation Age of Onset   Lung cancer Mother    Heart disease Father    Colon cancer Neg Hx    Breast cancer Neg Hx    Social History   Socioeconomic History   Marital status: Married    Spouse name: Jan   Number of children: 4   Years of education: Not on file   Highest education level: Not on file  Occupational History   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  Tobacco Use   Smoking status: Never   Smokeless tobacco: Never  Vaping Use   Vaping Use: Never used  Substance and Sexual Activity   Alcohol use: Yes    Comment: occ cocktail   Drug use: No   Sexual activity: Not Currently  Other Topics Concern   Not on file  Social History Narrative   Goes by [place]   Has living will    Has designated [employer] as health care POA   Would accept resuscitation attempts   Would not want tube feeds if cognitively unaware   Social Determinants of Health   Financial Resource Strain: Low Risk  (07/07/2021)   Overall Financial Resource Strain (CARDIA)    Difficulty of Paying Living Expenses: Not hard at all  Food Insecurity: No Food  Insecurity (07/07/2021)   Hunger Vital Sign    Worried About Running Out of Food in the Last Year: Never true    [place] in the Last Year: Never true  Transportation Needs: No Transportation Needs (07/07/2021)   PRAPARE - Hydrologist (Medical): No    Lack of Transportation (Non-Medical): No  Physical Activity: Sufficiently Active (07/03/2020)   Exercise Vital Sign    Days of Exercise per Week: 4 days    Minutes of Exercise per Session: 40 min  Stress: No Stress Concern Present (07/07/2021)   [place]    Feeling of Stress : Not at all  Social Connections: Unknown (07/07/2021)   Social Connection and Isolation Panel [NHANES]    Frequency of Communication with Friends and Family: Not on file    Frequency of Social Gatherings with Friends and Family: Not  on file    Attends Religious Services: Not on file    Active Member of Clubs or Organizations: Not on file    Attends Club or Organization Meetings: Not on file    Marital Status: Married  Intimate Partner Violence: Not At Risk (07/07/2021)   Humiliation, Afraid, Rape, and Kick questionnaire    Fear of Current or Ex-Partner: No    Emotionally Abused: No    Physically Abused: No    Sexually Abused: No   Current Meds  Medication Sig   doxycycline (VIBRA-TABS) 100 MG tablet Take 1 tablet (100 mg total) by mouth 2 (two) times daily. With food x 10 days   mupirocin ointment (BACTROBAN) 2 % Apply 1 Application topically 2 (two) times daily.   Allergies  Allergen Reactions   Cortisone Other (See Comments)    Leg Cramps   Metformin Diarrhea   Recent Results (from the past 2160 hour(s))  D-Dimer, Quantitative     Status: None   Collection Time: 06/09/21  3:43 PM  Result Value Ref Range   D-Dimer, Quant 0.45 <0.50 mcg/mL FEU    Comment: . The D-Dimer test is used frequently to exclude an acute PE or DVT. In patients with a low to moderate  clinical risk assessment and a D-Dimer result <0.50 mcg/mL FEU, the likelihood of a PE or DVT is very low. However, a thromboembolic event should not be excluded solely on the basis of the D-Dimer level. Increased levels of D-Dimer are associated with a PE, DVT, DIC, malignancies, inflammation, sepsis, surgery, trauma, pregnancy, and advancing patient age. [Jama 2006 11:295(2):199-207] . For additional information, please refer to: http://education.questdiagnostics.com/faq/FAQ149 (This link is being provided for informational/ educational purposes only) .    Objective  There is no height or weight on file to calculate BMI. Wt Readings from Last 3 Encounters:  07/07/21 227 lb (103 kg)  06/09/21 227 lb 6.4 oz (103.1 kg)  06/04/21 227 lb 12.8 oz (103.3 kg)   Temp Readings from Last 3 Encounters:  06/09/21 98.7 F (37.1 C) (Oral)  06/04/21 97.8 F (36.6 C) (Oral)  02/12/21 98.1 F (36.7 C) (Oral)   BP Readings from Last 3 Encounters:  06/09/21 120/60  06/04/21 140/70  02/12/21 140/60   Pulse Readings from Last 3 Encounters:  06/09/21 100  06/04/21 66  02/12/21 (!) 54    Physical Exam Vitals and nursing note reviewed.  Constitutional:      Appearance: Normal appearance. He is well-developed and well-groomed.  HENT:     Head: Normocephalic and atraumatic.  Eyes:     Conjunctiva/sclera: Conjunctivae normal.     Pupils: Pupils are equal, round, and reactive to light.  Cardiovascular:     Rate and Rhythm: Normal rate and regular rhythm.     Heart sounds: Normal heart sounds.  Pulmonary:     Effort: Pulmonary effort is normal. No respiratory distress.     Breath sounds: Normal breath sounds.  Abdominal:     Tenderness: There is no abdominal tenderness.  Skin:    General: Skin is warm and moist.  Neurological:     General: No focal deficit present.     Mental Status: He is alert and oriented to person, place, and time. Mental status is at baseline.     Sensory:  Sensation is intact.     Motor: Motor function is intact.     Coordination: Coordination is intact.     Gait: Gait is intact. Gait normal.  Psychiatric:  Attention and Perception: Attention and perception normal.        Mood and Affect: Mood and affect normal.        Speech: Speech normal.        Behavior: Behavior normal. Behavior is cooperative.        Thought Content: Thought content normal.        Cognition and Memory: Cognition and memory normal.        Judgment: Judgment normal.     Assessment  Plan  Wound of left lower extremity, initial encounter - Plan: Td : Tetanus/diphtheria >7yo Preservative  free, mupirocin ointment (BACTROBAN) 2 %, doxycycline (VIBRA-TABS) 100 MG tablet  Abrasion - Plan: mupirocin ointment (BACTROBAN) 2 %, doxycycline (VIBRA-TABS) 100 MG tablet  Cellulitis of left lower extremity See above  Rec bactine max otc  Clean with antibacterial soap  Provider: Dr. Olivia Mackie [facility]

## 2021-09-02 NOTE — Telephone Encounter (Signed)
Reason for Triage Call /in person: Pt came in for fall down steps at beach house Saturday with open gash on arm & large scrap down left calf.   Symptoms:Moderate  Onset: Saturday 08/28/21  Duration: Still present today. Pt fell at beach house down 5 steps causing gash on left arm & left leg. Pt concerned of not healing & infection due to diabetes.   Medications: Plain tetanus given. Cleaned wound with saline & wrapped with Telfa dressing. Dr. Olivia Mackie evaluated patient and was to prescribe doxycycline BID, Bactroban ointment. Was advised to get Bactine & antibacterial soap OTC.  Last seen for this problem: Seen today be Dr. Olivia Mackie.  Outcome: Wounds cleaned & wrapped. Dr. Olivia Mackie saw patient & prescribed medications accordingly.

## 2021-09-03 DIAGNOSIS — I5032 Chronic diastolic (congestive) heart failure: Secondary | ICD-10-CM | POA: Diagnosis not present

## 2021-09-03 DIAGNOSIS — I2510 Atherosclerotic heart disease of native coronary artery without angina pectoris: Secondary | ICD-10-CM | POA: Diagnosis not present

## 2021-09-03 DIAGNOSIS — I10 Essential (primary) hypertension: Secondary | ICD-10-CM | POA: Diagnosis not present

## 2021-09-03 NOTE — Telephone Encounter (Signed)
See note

## 2021-09-04 ENCOUNTER — Other Ambulatory Visit: Payer: Self-pay | Admitting: Family Medicine

## 2021-09-10 ENCOUNTER — Encounter: Payer: Self-pay | Admitting: Family Medicine

## 2021-09-10 ENCOUNTER — Ambulatory Visit: Payer: Medicare Other | Admitting: Cardiovascular Disease

## 2021-09-10 ENCOUNTER — Ambulatory Visit (INDEPENDENT_AMBULATORY_CARE_PROVIDER_SITE_OTHER): Payer: Medicare Other | Admitting: Family Medicine

## 2021-09-10 DIAGNOSIS — L03116 Cellulitis of left lower limb: Secondary | ICD-10-CM

## 2021-09-10 DIAGNOSIS — M1990 Unspecified osteoarthritis, unspecified site: Secondary | ICD-10-CM

## 2021-09-10 DIAGNOSIS — E118 Type 2 diabetes mellitus with unspecified complications: Secondary | ICD-10-CM | POA: Diagnosis not present

## 2021-09-10 DIAGNOSIS — I10 Essential (primary) hypertension: Secondary | ICD-10-CM | POA: Diagnosis not present

## 2021-09-10 LAB — COMPREHENSIVE METABOLIC PANEL
ALT: 7 U/L (ref 0–53)
AST: 13 U/L (ref 0–37)
Albumin: 3.6 g/dL (ref 3.5–5.2)
Alkaline Phosphatase: 128 U/L — ABNORMAL HIGH (ref 39–117)
BUN: 23 mg/dL (ref 6–23)
CO2: 25 mEq/L (ref 19–32)
Calcium: 8.9 mg/dL (ref 8.4–10.5)
Chloride: 107 mEq/L (ref 96–112)
Creatinine, Ser: 1.64 mg/dL — ABNORMAL HIGH (ref 0.40–1.50)
GFR: 37.81 mL/min — ABNORMAL LOW (ref >60.00)
Glucose, Bld: 200 mg/dL — ABNORMAL HIGH (ref 70–99)
Potassium: 4.1 mEq/L (ref 3.5–5.1)
Sodium: 138 mEq/L (ref 135–145)
Total Bilirubin: 0.8 mg/dL (ref 0.2–1.2)
Total Protein: 5.9 g/dL — ABNORMAL LOW (ref 6.0–8.3)

## 2021-09-10 LAB — HEMOGLOBIN A1C: Hgb A1c MFr Bld: 7.9 % — ABNORMAL HIGH (ref 4.6–6.5)

## 2021-09-10 MED ORDER — VALSARTAN 160 MG PO TABS
160.0000 mg | ORAL_TABLET | Freq: Every day | ORAL | 1 refills | Status: DC
Start: 2021-09-10 — End: 2021-10-06

## 2021-09-10 MED ORDER — TRAMADOL HCL 50 MG PO TABS: 50.0000 mg | ORAL_TABLET | Freq: Three times a day (TID) | ORAL | 0 refills | Status: AC | PRN

## 2021-09-10 NOTE — Patient Instructions (Signed)
Nice to see you. You can use the tramadol for pain related to your arthritis.  If this makes you drowsy please discontinue it and let me know.  Please let me know if its not beneficial. We will contact you with your lab results. We are reducing the valsartan dose to 160 mg daily.  Please keep an eye on your blood pressure.  I will see you back in a month to recheck it.

## 2021-09-10 NOTE — Assessment & Plan Note (Signed)
Patient has likely osteoarthritis in his left wrist.  NSAIDs are not an option given his cardiac issues.  We will treat with tramadol 50 mg every 8 hours as needed for pain.  Discussed risk of drowsiness with this medication.  He confirmed he does not have a history of seizures.  If he gets drowsy or this medication is not beneficial he will let us know.

## 2021-09-10 NOTE — Progress Notes (Signed)
[name], MD Phone: [phone]  [name] [name] is a 88 y.o. male who presents today for f/u.  Leg wound/cellulitis: Cellulitis has improved significantly.  He continues on doxycycline.  He notes the wound was related to a fall where he missed a step going down the stairs.  He has had some soreness since the fall.  No head injury.  Hypertension: He is not checking blood pressures at home.  He does report getting lightheaded on arising from a seated position for the last couple of months.  Notes no chest pain.  He notes his breathing has improved with the addition of Farxiga and one of his cardiac medications.  Diabetes: Notes that he has been in the low 100s.  He has been on [place].  Chronic osteoarthritis: Patient notes his left wrist is the worst discomfort that he has related to this.  He notes this has been an ongoing issue.  Tylenol helps minimally.  He notes no history of seizures.  Social History   Tobacco Use  Smoking Status Never  Smokeless Tobacco Never    Current Outpatient Medications on File Prior to Visit  Medication Sig Dispense Refill   aspirin EC 81 MG tablet Take 81 mg by mouth every other day. In the morning     BD PEN NEEDLE NANO U/F 32G X 4 MM MISC USE AS DIRECTED 100 each 0   carvedilol (COREG) 12.5 MG tablet TAKE ONE (1) TABLET BY MOUTH TWO TIMES PER DAY 180 tablet 0   Cholecalciferol (VITAMIN D3) 50 MCG (2000 UT) TABS Take 2,000 Units by mouth in the morning.     dapagliflozin propanediol (FARXIGA) 10 MG TABS tablet Take 1 tablet (10 mg total) by mouth daily before breakfast. 90 tablet 1   doxazosin (CARDURA) 2 MG tablet TAKE ONE (1) TABLET BY MOUTH TWO TIMES PER DAY 60 tablet 3   doxycycline (VIBRA-TABS) 100 MG tablet Take 1 tablet (100 mg total) by mouth 2 (two) times daily. With food x 10 days 20 tablet 0   finasteride (PROSCAR) 5 MG tablet Take 1 tablet (5 mg total) by mouth daily. (Patient taking differently: Take 5 mg by mouth every  evening.) 90 tablet 1   gabapentin (NEURONTIN) 100 MG capsule TAKE 1 CAPSULE BY MOUTH 3 TIMES DAILY 90 capsule 1   hydrALAZINE (APRESOLINE) 25 MG tablet Take 1 tablet (25 mg total) by mouth in the morning and at bedtime. 180 tablet 2   isosorbide mononitrate (IMDUR) 30 MG 24 hr tablet TAKE 1 TABLET BY MOUTH DAILY 30 tablet 5   lidocaine (LIDODERM) 5 % Place 1 patch onto the skin daily. Remove & Discard patch within 12 hours or as directed by MD 30 patch 0   loperamide (IMODIUM A-D) 2 MG tablet Take 1 tablet (2 mg total) by mouth 2 (two) times daily as needed for up to 60 doses for diarrhea or loose stools. (Patient taking differently: Take 2 mg by mouth in the morning.) 60 tablet 0   Multiple Vitamins-Minerals (PRESERVISION AREDS 2 PO) Take 1 tablet by mouth in the morning and at bedtime.     mupirocin ointment (BACTROBAN) 2 % Apply 1 Application topically 2 (two) times daily. 30 g 0   omeprazole (PRILOSEC) 20 MG capsule TAKE 1 CAPSULE BY MOUTH TWICE DAILY 60 capsule 1   potassium chloride (KLOR-CON) 10 MEQ tablet Take 1 tablet (10 mEq total) by mouth daily as needed. Take with torsemide 90 tablet 3   torsemide (DEMADEX)  20 MG tablet For weight 210 to 215 take torsemide 20 mg with potassium 10 meq. For weight > Torsemide 20 mg twice a day  (8 am and 2 pm) With potassium 20 meq 200 tablet 3   TOUJEO SOLOSTAR 300 UNIT/ML Solostar Pen INJECT 50 UNITS DAILY MAY INCREASE 2U EVERY 3 DAYS UNTIL FBG IS AT GOAL UP TO 80 UNITS DAILY 22.5 mL 6   ezetimibe (ZETIA) 10 MG tablet Take 1 tablet (10 mg total) by mouth daily. 90 tablet 3   rosuvastatin (CRESTOR) 20 MG tablet Take 1 tablet (20 mg total) by mouth daily. (Patient not taking: Reported on 09/10/2021) 90 tablet 3   No current facility-administered medications on file prior to visit.     ROS see history of present illness  Objective  Physical Exam Vitals:   09/10/21 1340  BP: 110/60  Pulse: 73  Temp: 98.4 F (36.9 C)  SpO2: 97%   Lying blood  pressure 122/58 pulse 70 Sitting blood pressure 109/58 pulse 71 Standing blood pressure 106/57 pulse 71  BP Readings from Last 3 Encounters:  09/10/21 110/60  06/09/21 120/60  06/04/21 140/70   Wt Readings from Last 3 Encounters:  09/10/21 217 lb (98.4 kg)  07/07/21 227 lb (103 kg)  06/09/21 227 lb 6.4 oz (103.1 kg)    Physical Exam Constitutional:      General: He is not in acute distress.    Appearance: He is not diaphoretic.  Cardiovascular:     Rate and Rhythm: Normal rate and regular rhythm.     Heart sounds: Normal heart sounds.  Pulmonary:     Effort: Pulmonary effort is normal.     Breath sounds: Normal breath sounds.  Skin:    General: Skin is warm and dry.     Comments: Well-healing superficial abrasions over his left anterior lower leg and left upper posterior arm, no surrounding erythema  Neurological:     Mental Status: He is alert.      Assessment/Plan: Please see individual problem list.  Problem List Items Addressed This Visit     Essential hypertension (Chronic)    BP is on the low side today and he reports some lightheadedness with rising.  Orthostatics completed today.  We will reduce his valsartan to 160 mg daily.  He will continue carvedilol 12.5 mg twice daily, Cardura 2 mg twice daily, and hydralazine 25 mg 3 times daily.      Relevant Medications   valsartan (DIOVAN) 160 MG tablet   Other Relevant Orders   Comp Met (CMET)   Type 2 diabetes mellitus with complication, without long-term current use of insulin (HCC) (Chronic)    He will continue Farxiga 10 mg daily and Toujeo 40 units daily.  Check A1c and CMP.      Relevant Medications   valsartan (DIOVAN) 160 MG tablet   Other Relevant Orders   Comp Met (CMET)   HgB A1c   Cellulitis    Improved with doxycycline.  He will complete his course of doxycycline.  If the wounds do not continue to heal well he will let us know.      Chronic arthritis    Patient has likely osteoarthritis in  his left wrist.  NSAIDs are not an option given his cardiac issues.  We will treat with tramadol 50 mg every 8 hours as needed for pain.  Discussed risk of drowsiness with this medication.  He confirmed he does not have a history of seizures.  If he  gets drowsy or this medication is not beneficial he will let us know.      Relevant Medications   traMADol (ULTRAM) 50 MG tablet      Return in about 1 month (around 10/11/2021) for Blood pressure with PCP.   [name], MD [place]

## 2021-09-10 NOTE — Assessment & Plan Note (Signed)
He will continue Farxiga 10 mg daily and Toujeo 40 units daily.  Check A1c and CMP.

## 2021-09-10 NOTE — Assessment & Plan Note (Signed)
BP is on the low side today and he reports some lightheadedness with rising.  Orthostatics completed today.  We will reduce his valsartan to 160 mg daily.  He will continue carvedilol 12.5 mg twice daily, Cardura 2 mg twice daily, and hydralazine 25 mg 3 times daily.

## 2021-09-10 NOTE — Assessment & Plan Note (Signed)
Improved with doxycycline.  He will complete his course of doxycycline.  If the wounds do not continue to heal well he will let us know.

## 2021-09-20 ENCOUNTER — Telehealth: Payer: Self-pay | Admitting: Family Medicine

## 2021-09-20 ENCOUNTER — Other Ambulatory Visit: Payer: Self-pay

## 2021-09-20 ENCOUNTER — Encounter: Payer: Self-pay | Admitting: Pharmacy Technician

## 2021-09-20 NOTE — Telephone Encounter (Signed)
Patient came by the office today: (1) Patient was read the lab notes from Dr [name] about his A1C.  (2). Patient is also requesting that Dr [name] sign a form that gives him financial assistance for Eliquis (after speaking with Joyice Faster).

## 2021-09-20 NOTE — Patient Outreach (Signed)
Patient stated that he is in Medicare Part D coverage gap.  Looking for assistance for Xarelto.  Dr. Estevan Ryder [facility] prescribes this medication.  Made patient aware that we could not assist with the completion of PAP application since patient is not prescribed this medication by a [facility] provider.  Patient is to reach out to Dr. Baron Hamper and inquire about assistance with the completion of PAP application.  Jacquelynn Cree Patient Advocate Specialist [place]

## 2021-09-21 NOTE — Telephone Encounter (Signed)
I called and spoke with the patient and he stated that [facility] was working on the Eliquis to disregard.  Keirston Saephanh,cma

## 2021-09-27 ENCOUNTER — Telehealth: Payer: Self-pay

## 2021-09-27 NOTE — Telephone Encounter (Signed)
Patient states he is experiencing higher blood pressure readings and some dizziness when standing up since his blood pressure medicine was decreased.  I called Access Nurse and they will call patient back.  I let patient know that Access Nurse will be calling him and the number they call from may have a different area code from his number.

## 2021-10-01 ENCOUNTER — Other Ambulatory Visit: Payer: Self-pay

## 2021-10-04 ENCOUNTER — Other Ambulatory Visit: Payer: Self-pay | Admitting: Cardiovascular Disease

## 2021-10-06 ENCOUNTER — Ambulatory Visit (INDEPENDENT_AMBULATORY_CARE_PROVIDER_SITE_OTHER): Payer: Medicare Other | Admitting: Internal Medicine

## 2021-10-06 ENCOUNTER — Encounter: Payer: Self-pay | Admitting: Internal Medicine

## 2021-10-06 DIAGNOSIS — I10 Essential (primary) hypertension: Secondary | ICD-10-CM | POA: Diagnosis not present

## 2021-10-06 MED ORDER — HYDRALAZINE HCL 25 MG PO TABS: ORAL_TABLET | ORAL | 3 refills | Status: DC

## 2021-10-06 MED ORDER — VALSARTAN 320 MG PO TABS: 320.0000 mg | ORAL_TABLET | Freq: Every day | ORAL | 3 refills | Status: DC

## 2021-10-06 NOTE — Progress Notes (Signed)
Chief Complaint  Patient presents with   Hypertension    Pt presents for BP check He has been getting elevated numbers at home on machine in the am especially and wonders if its his machine or him.    F/u with wife  1. Variable htn home readings in the am home log 150-170s sbp before meds and this makes him nervous and wife about stroke or MI with cardiac history  He was having low bp readings on diovan 320 mg qd so this was reduced in 08/2021 with PCP to 160 mg qd but then he called [facility] cards 09/03/21 due to BP 175/60 and they rec he increase back to diovan 320 mg qd which he has x 10 days no dizziness or low BP and BP is better in the am at times but at times still high  On coreg 12.5 mg bid Cardura 2 mg bid Hydralaxzine 25 mg bid  Imdur 30 er qd  Torsemide 20 mg with CKD 3b only taking prn  Diovan back to 320 mg dose qam x 10 days   He purchased new BP cuff to help with BP controlled   Today his cuff 124/65 and our manual 130/60    2. Abnormal cardiac MRI 09/03/21 Dr Iona Coach [facility] evidence of prior MRI rec he call and sch cardiac f/u [facility] cards before 1 year and also he will keep local cardiology   3. Sob better since starting eliquis 5 mg bid and farxiga 10 mg qd but still there and initially starting eliquis he felt dizzy but this has improved and he read this could be side effect   4. Leg wounds from fall doing better but still healing appt with PCP 10/08/21 will move back to 10/16/30     Review of Systems  Constitutional:  Negative for weight loss.  HENT:  Negative for hearing loss.   Eyes:  Negative for blurred vision.  Respiratory:  Negative for shortness of breath.   Cardiovascular:  Negative for chest pain.  Gastrointestinal:  Negative for abdominal pain and blood in stool.  Musculoskeletal:  Negative for back pain.  Skin:  Negative for rash.  Neurological:  Negative for headaches.  Psychiatric/Behavioral:  Negative for depression.    Past Medical History:   Diagnosis Date   (HFpEF) heart failure with preserved ejection fraction ([place])    a. 03/6107 Echo: nl systolic fxn, mild LVH, diastolic relaxation abnormality, mildly enlarged LA, mild Ao insufficiency; 01/2018 Echo: EF 55-60%, no rwma, GR2 DD, triv AI, mildly dil LA, mild inc PASP.   AMS (altered mental status) 02/03/2021   BPH (benign prostatic hyperplasia)    Cancer ([place])    skin   Cataract    Choledocholithiasis    a. 05/2020 ERCP w/ CBD stone removal and biliary sphincterotomy.   Coronary artery disease    a. 01/2005 s/p CABG x 4: LIMA-LAD, VG-Diag, VG-OM3, VG-dRCA; b. 06/2013 Cath: patent grafts; c. 09/2015 Cath: VG->Diag 100, other grafts patent. LAD 100, RCA sev dzs->Med rx; d. 01/2018 Cath: LAD 100ost/p, LCX 50, RCA 100p/d, VG->OM3 100, VG->Diag 100, LIMA->LAD ok, VG->RPAV 16m->Med rx.   COVID-19    07/2020 had paxlovid    Dyspnea    with exertion   Dysrhythmia    ED (erectile dysfunction)    Elevated LFTs    a. 05/2020 following CBD stone. 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   GERD (gastroesophageal reflux disease)    Hyperlipidemia  Hypertension    IDDM (insulin dependent diabetes mellitus) 2000   Osteoarthritis    PAH (pulmonary artery hypertension) (HCC)    a. 01/2018 RHC: PA 51/22 (32).   Perirectal fistula    Pneumonia 12/2016   Shingles 09/04/2018   Tubular adenoma of colon 07/2012   Vertigo    Past Surgical History:  Procedure Laterality Date   BACK SURGERY     CARDIAC CATHETERIZATION  06/05/2013   [facility]    CARDIAC CATHETERIZATION N/A 09/24/2015   Procedure: Right Heart Cath and Coronary/Graft Angiography;  Surgeon: Timothy J Gollan, MD;  Location: [facility] INVASIVE CV LAB;  Service: Cardiovascular;  Laterality: N/A;   CATARACT EXTRACTION  sept 2013   right   CATARACT EXTRACTION W/PHACO Left 03/28/2017   Procedure: CATARACT EXTRACTION PHACO AND INTRAOCULAR LENS PLACEMENT (IOC);  Surgeon: Porfilio, Jerman, MD;   Location: [facility] ORS;  Service: Ophthalmology;  Laterality: Left;  US 00:42.0 AP% 15.0 CDE 6.31 Fluid Pack lot # [id number]   CHOLECYSTECTOMY  05/15/2011   Procedure: LAPAROSCOPIC CHOLECYSTECTOMY;  Surgeon: Matthew Wakefield, MD;  Location: [facility];  Service: General;  Laterality: N/A;   COLONOSCOPY W/ POLYPECTOMY     CORONARY ARTERY BYPASS GRAFT  2007   x 4   CORONARY STENT INTERVENTION N/A 12/08/2020   Procedure: CORONARY STENT INTERVENTION;  Surgeon: End, Christopher, MD;  Location: [facility];  Service: Cardiovascular;  Laterality: N/A;   ERCP N/A 05/05/2020   Procedure: ENDOSCOPIC RETROGRADE CHOLANGIOPANCREATOGRAPHY (ERCP);  Surgeon: Wohl, Darren, MD;  Location: [facility] ENDOSCOPY;  Service: Endoscopy;  Laterality: N/A;   ERCP N/A 05/12/2020   Procedure: ENDOSCOPIC RETROGRADE CHOLANGIOPANCREATOGRAPHY (ERCP);  Surgeon: Wohl, Darren, MD;  Location: [facility] ENDOS[facility];  Service: Endoscopy;  Laterality: N/A;   ESOPHAGOGASTRODUODENOSCOPY N/A 05/12/2020   Procedure: ESOPHAGOGASTRODUODENOSCOPY (EGD);  Surgeon: Wohl, Darren, MD;  Location: [facility] ENDOS[facility];  Service: Endoscopy;  Laterality: N/A;   EYE SURGERY     FOOT SURGERY  2012   right foot   JOINT REPLACEMENT  8/10   Right THR--[name]   LEFT AND RIGHT HEART CATHETERIZATION WITH CORONARY/GRAFT ANGIOGRAM N/A 06/05/2013   Procedure: LEFT AND RIGHT HEART CATHETERIZATION WITH CORONARY/GRAFT ANGIOGRAM;  Surgeon: Peter M Jordan, MD;  Location: [facility];  Service: Cardiovascular;  Laterality: N/A;   LUMBAR LAMINECTOMY  1989   Prostate photovaporization  5/16   Dr Shaban---[facility]   RIGHT/LEFT HEART CATH AND CORONARY/GRAFT ANGIOGRAPHY N/A 01/05/2018   Procedure: RIGHT/LEFT HEART CATH AND CORONARY/GRAFT ANGIOGRAPHY;  Surgeon: Gollan, Timothy J, MD;  Location: [facility];  Service: Cardiovascular;  Laterality: N/A;   RIGHT/LEFT HEART CATH AND CORONARY/GRAFT ANGIOGRAPHY Bilateral 12/08/2020   Procedure: RIGHT/LEFT HEART CATH AND CORONARY/GRAFT  ANGIOGRAPHY;  Surgeon: End, Christopher, MD;  Location: [facility];  Service: Cardiovascular;  Laterality: Bilateral;   Family History  Problem Relation Age of Onset   Lung cancer Mother    Heart disease Father    Colon cancer Neg Hx    Breast cancer Neg Hx    Social History   Socioeconomic History   Marital status: Married    Spouse name: Jan   Number of children: 4   Years of education: Not on file   Highest education level: Not on file  Occupational History   Occupation: Manufacturers representative    Comment: photographic equipment  Tobacco Use   Smoking status: Never   Smokeless tobacco: Never  Vaping Use   Vaping Use: Never used  Substance and Sexual Activity   Alcohol use:   Yes    Comment: occ cocktail   Drug use: No   Sexual activity: Not Currently  Other Topics Concern   Not on file  Social History Narrative   Goes by [name]   Has living will    Has designated Janet Whitlatch as health care POA   Would accept resuscitation attempts   Would not want tube feeds if cognitively unaware   Social Determinants of Health   Financial Resource Strain: Low Risk  (07/07/2021)   Overall Financial Resource Strain (CARDIA)    Difficulty of Paying Living Expenses: Not hard at all  Food Insecurity: No Food Insecurity (07/07/2021)   Hunger Vital Sign    Worried About Running Out of Food in the Last Year: Never true    Ran Out of Food in the Last Year: Never true  Transportation Needs: No Transportation Needs (07/07/2021)   PRAPARE - Transportation    Lack of Transportation (Medical): No    Lack of Transportation (Non-Medical): No  Physical Activity: Sufficiently Active (07/03/2020)   Exercise Vital Sign    Days of Exercise per Week: 4 days    Minutes of Exercise per Session: 40 min  Stress: No Stress Concern Present (07/07/2021)   [employer] - Occupational Stress Questionnaire    Feeling of Stress : Not at all  Social Connections:  Unknown (07/07/2021)   Social Connection and Isolation Panel [NHANES]    Frequency of Communication with Friends and Family: Not on file    Frequency of Social Gatherings with Friends and Family: Not on file    Attends Religious Services: Not on file    Active Member of Clubs or Organizations: Not on file    Attends Club or Organization Meetings: Not on file    Marital Status: Married  Intimate Partner Violence: Not At Risk (07/07/2021)   Humiliation, Afraid, Rape, and Kick questionnaire    Fear of Current or Ex-Partner: No    Emotionally Abused: No    Physically Abused: No    Sexually Abused: No   Current Meds  Medication Sig   apixaban (ELIQUIS) 5 MG TABS tablet Take 5 mg by mouth 2 (two) times daily.   aspirin EC 81 MG tablet Take 81 mg by mouth every other day. In the morning   BD PEN NEEDLE NANO U/F 32G X 4 MM MISC USE AS DIRECTED   carvedilol (COREG) 12.5 MG tablet TAKE ONE (1) TABLET BY MOUTH TWO TIMES PER DAY   Cholecalciferol (VITAMIN D3) 50 MCG (2000 UT) TABS Take 2,000 Units by mouth in the morning.   dapagliflozin propanediol (FARXIGA) 10 MG TABS tablet Take 1 tablet (10 mg total) by mouth daily before breakfast.   doxazosin (CARDURA) 2 MG tablet TAKE ONE (1) TABLET BY MOUTH TWO TIMES PER DAY   doxycycline (VIBRA-TABS) 100 MG tablet Take 1 tablet (100 mg total) by mouth 2 (two) times daily. With food x 10 days   finasteride (PROSCAR) 5 MG tablet Take 1 tablet (5 mg total) by mouth daily. (Patient taking differently: Take 5 mg by mouth every evening.)   gabapentin (NEURONTIN) 100 MG capsule TAKE 1 CAPSULE BY MOUTH 3 TIMES DAILY   isosorbide mononitrate (IMDUR) 30 MG 24 hr tablet TAKE 1 TABLET BY MOUTH DAILY   lidocaine (LIDODERM) 5 % Place 1 patch onto the skin daily. Remove & Discard patch within 12 hours or as directed by MD   loperamide (IMODIUM A-D) 2 MG tablet Take 1 tablet (2 mg total)   by mouth 2 (two) times daily as needed for up to 60 doses for diarrhea or loose stools.  (Patient taking differently: Take 2 mg by mouth in the morning.)   Multiple Vitamins-Minerals (PRESERVISION AREDS 2 PO) Take 1 tablet by mouth in the morning and at bedtime.   mupirocin ointment (BACTROBAN) 2 % Apply 1 Application topically 2 (two) times daily.   omeprazole (PRILOSEC) 20 MG capsule TAKE 1 CAPSULE BY MOUTH TWICE DAILY   potassium chloride (KLOR-CON) 10 MEQ tablet Take 1 tablet (10 mEq total) by mouth daily as needed. Take with torsemide   rosuvastatin (CRESTOR) 20 MG tablet Take 1 tablet (20 mg total) by mouth daily.   torsemide (DEMADEX) 20 MG tablet For weight 210 to 215 take torsemide 20 mg with potassium 10 meq. For weight > Torsemide 20 mg twice a day  (8 am and 2 pm) With potassium 20 meq   TOUJEO SOLOSTAR 300 UNIT/ML Solostar Pen INJECT 50 UNITS DAILY MAY INCREASE 2U EVERY 3 DAYS UNTIL FBG IS AT GOAL UP TO 80 UNITS DAILY   [DISCONTINUED] hydrALAZINE (APRESOLINE) 25 MG tablet Take 1 tablet (25 mg total) by mouth in the morning and at bedtime.   [DISCONTINUED] valsartan (DIOVAN) 160 MG tablet Take 1 tablet (160 mg total) by mouth daily.   Allergies  Allergen Reactions   Cortisone Other (See Comments)    Leg Cramps   Metformin Diarrhea   Recent Results (from the past 2160 hour(s))  Comp Met (CMET)     Status: Abnormal   Collection Time: 09/10/21  2:12 PM  Result Value Ref Range   Sodium 138 135 - 145 mEq/L   Potassium 4.1 3.5 - 5.1 mEq/L   Chloride 107 96 - 112 mEq/L   CO2 25 19 - 32 mEq/L   Glucose, Bld 200 (H) 70 - 99 mg/dL   BUN 23 6 - 23 mg/dL   Creatinine, Ser 1.64 (H) 0.40 - 1.50 mg/dL   Total Bilirubin 0.8 0.2 - 1.2 mg/dL   Alkaline Phosphatase 128 (H) 39 - 117 U/L   AST 13 0 - 37 U/L   ALT 7 0 - 53 U/L   Total Protein 5.9 (L) 6.0 - 8.3 g/dL   Albumin 3.6 3.5 - 5.2 g/dL   GFR 37.81 (L) >60.00 mL/min    Comment: Calculated using the CKD-EPI Creatinine Equation (2021)   Calcium 8.9 8.4 - 10.5 mg/dL  HgB A1c     Status: Abnormal   Collection Time:  09/10/21  2:12 PM  Result Value Ref Range   Hgb A1c MFr Bld 7.9 (H) 4.6 - 6.5 %    Comment: Glycemic Control Guidelines for People with Diabetes:Non Diabetic:  <6%Goal of Therapy: <7%Additional Action Suggested:  >8%    Objective  Body mass index is 29.35 kg/m. Wt Readings from Last 3 Encounters:  10/06/21 216 lb 6.4 oz (98.2 kg)  09/10/21 217 lb (98.4 kg)  07/07/21 227 lb (103 kg)   Temp Readings from Last 3 Encounters:  10/06/21 97.6 F (36.4 C) (Oral)  09/10/21 98.4 F (36.9 C) (Oral)  06/09/21 98.7 F (37.1 C) (Oral)   BP Readings from Last 3 Encounters:  10/06/21 130/60  09/10/21 110/60  06/09/21 120/60   Pulse Readings from Last 3 Encounters:  10/06/21 69  09/10/21 73  06/09/21 100    Physical Exam Vitals and nursing note reviewed.  Constitutional:      Appearance: Normal appearance. He is well-developed and well-groomed.  HENT:       Head: Normocephalic and atraumatic.  Eyes:     Conjunctiva/sclera: Conjunctivae normal.     Pupils: Pupils are equal, round, and reactive to light.  Cardiovascular:     Rate and Rhythm: Normal rate and regular rhythm.     Heart sounds: Normal heart sounds.  Pulmonary:     Effort: Pulmonary effort is normal. No respiratory distress.     Breath sounds: Normal breath sounds.  Abdominal:     Tenderness: There is no abdominal tenderness.  Skin:    General: Skin is warm and moist.       Neurological:     General: No focal deficit present.     Mental Status: He is alert and oriented to person, place, and time. Mental status is at baseline.     Sensory: Sensation is intact.     Motor: Motor function is intact.     Coordination: Coordination is intact.     Gait: Gait is intact. Gait normal.  Psychiatric:        Attention and Perception: Attention and perception normal.        Mood and Affect: Mood and affect normal.        Speech: Speech normal.        Behavior: Behavior normal. Behavior is cooperative.        Thought Content:  Thought content normal.        Cognition and Memory: Cognition and memory normal.        Judgment: Judgment normal.     Assessment  Plan  Essential hypertension - Plan: valsartan (DIOVAN) 320 MG tablet qam rec change to qhs for better am bp control and continue other meds as noted below, hydrALAZINE (APRESOLINE) 25 MG tablet bid can increase to tid if BP>140/>80 On coreg 12.5 mg bid Cardura 2 mg bid Hydralaxzine 25 mg bid  Imdur 30 er qd  Torsemide 20 mg with CKD 3b only taking prn  Diovan back to 320 mg dose qam x 10 days rec change to qhs F/u with PCP in 1-2 weeks  F/u Dr. Gollan and [facility] cards try to get an appt to f/u before 1 year pt had abnormal test from 09/03/21 with evidence of prior MI and LVH. F/u sch [facility] year with [facility] will try to get this moved up asap to review test as pt and wife had ?s today   Provider: Dr. Tracy McLean-Scocuzza-Internal Medicine  

## 2021-10-06 NOTE — Patient Instructions (Addendum)
55 ounces of water total daily  Move diovan 320 at bedtime  Hydralazine 2x per day if needed you can take up to 3x per day for BP>140/>80  Call to be seen sooner than 1 year based on test 09/03/21    Diagnoses   Coronary artery disease involving native coronary artery of native heart without angina pectoris   Primary hypertension   Chronic diastolic CHF (congestive heart failure) (CMS-HCC)     Procedures   Cardiac MRI heart stress morphology and function with and without contrast   Linus Galas, MD   [place], [place] [address]   Phone: [phone]   Fax: [phone]     Consider cardiopulmonary rehab

## 2021-10-06 NOTE — Progress Notes (Signed)
Inform pt [name] [name] wants him to call and sch appt before 1 year

## 2021-10-08 ENCOUNTER — Ambulatory Visit: Payer: Medicare Other | Admitting: Family Medicine

## 2021-10-11 ENCOUNTER — Other Ambulatory Visit: Payer: Self-pay | Admitting: Family Medicine

## 2021-10-12 ENCOUNTER — Other Ambulatory Visit: Payer: Self-pay | Admitting: Emergency Medicine

## 2021-10-15 ENCOUNTER — Encounter: Payer: Self-pay | Admitting: Family Medicine

## 2021-10-15 ENCOUNTER — Ambulatory Visit (INDEPENDENT_AMBULATORY_CARE_PROVIDER_SITE_OTHER): Payer: Medicare Other | Admitting: Family Medicine

## 2021-10-15 ENCOUNTER — Other Ambulatory Visit: Payer: Self-pay | Admitting: Family Medicine

## 2021-10-15 DIAGNOSIS — E118 Type 2 diabetes mellitus with unspecified complications: Secondary | ICD-10-CM

## 2021-10-15 DIAGNOSIS — I10 Essential (primary) hypertension: Secondary | ICD-10-CM

## 2021-10-15 MED ORDER — HYDRALAZINE HCL 25 MG PO TABS: 25.0000 mg | ORAL_TABLET | Freq: Three times a day (TID) | ORAL | 3 refills | Status: AC

## 2021-10-15 MED ORDER — TOUJEO SOLOSTAR 300 UNIT/ML ~~LOC~~ SOPN: 40.0000 [IU] | PEN_INJECTOR | Freq: Every day | SUBCUTANEOUS | 6 refills | Status: DC

## 2021-10-15 NOTE — Progress Notes (Signed)
[name], MD Phone: [phone]  [name] [name] is a 88 y.o. male who presents today for f/u.  HYPERTENSION Disease Monitoring Home BP Monitoring greater than 160/90 before he takes his meds, often times is up to 180 over 90s. Later in the afternoon after he takes his medications it does come down to a more normal range.  Chest pain- no    Dyspnea- no Medications Compliance-taking carvedilol, Cardura, hydralazine, Imdur, torsemide, and valsartan, patient reports his cardiologist increased his valsartan back to 320 as his blood pressure was running elevated. Lightheadedness-  no  Edema- minimal BMET    Component Value Date/Time   NA 138 09/10/2021 1412   NA 142 01/26/2021 1604   NA 139 02/22/2013 1654   K 4.1 09/10/2021 1412   K 4.5 02/22/2013 1654   CL 107 09/10/2021 1412   CL 109 (H) 02/22/2013 1654   CO2 25 09/10/2021 1412   CO2 24 02/22/2013 1654   GLUCOSE 200 (H) 09/10/2021 1412   GLUCOSE 197 (H) 02/22/2013 1654   BUN 23 09/10/2021 1412   BUN 30 (H) 01/26/2021 1604   BUN 20 (H) 02/22/2013 1654   CREATININE 1.64 (H) 09/10/2021 1412   CREATININE 1.52 (H) 02/12/2021 1452   CALCIUM 8.9 09/10/2021 1412   CALCIUM 9.1 02/22/2013 1654   GFRNONAA 46 (L) 02/03/2021 0303   GFRNONAA 59 (L) 02/22/2013 1654   GFRAA 53 (L) 04/27/2020 0930   GFRAA >60 02/22/2013 1654     Social History   Tobacco Use  Smoking Status Never  Smokeless Tobacco Never    Current Outpatient Medications on File Prior to Visit  Medication Sig Dispense Refill   apixaban (ELIQUIS) 5 MG TABS tablet Take 5 mg by mouth 2 (two) times daily.     aspirin EC 81 MG tablet Take 81 mg by mouth every other day. In the morning     BD PEN NEEDLE NANO U/F 32G X 4 MM MISC USE AS DIRECTED 100 each 0   carvedilol (COREG) 12.5 MG tablet TAKE ONE (1) TABLET BY MOUTH TWO TIMES PER DAY 180 tablet 0   Cholecalciferol (VITAMIN D3) 50 MCG (2000 UT) TABS Take 2,000 Units by mouth in the morning.     dapagliflozin  propanediol (FARXIGA) 10 MG TABS tablet Take 1 tablet (10 mg total) by mouth daily before breakfast. 90 tablet 1   doxazosin (CARDURA) 2 MG tablet TAKE ONE (1) TABLET BY MOUTH TWO TIMES PER DAY 60 tablet 3   doxycycline (VIBRA-TABS) 100 MG tablet Take 1 tablet (100 mg total) by mouth 2 (two) times daily. With food x 10 days 20 tablet 0   finasteride (PROSCAR) 5 MG tablet Take 1 tablet (5 mg total) by mouth daily. (Patient taking differently: Take 5 mg by mouth every evening.) 90 tablet 1   gabapentin (NEURONTIN) 100 MG capsule TAKE 1 CAPSULE BY MOUTH 3 TIMES DAILY 90 capsule 1   isosorbide mononitrate (IMDUR) 30 MG 24 hr tablet TAKE 1 TABLET BY MOUTH DAILY 30 tablet 5   lidocaine (LIDODERM) 5 % Place 1 patch onto the skin daily. Remove & Discard patch within 12 hours or as directed by MD 30 patch 0   loperamide (IMODIUM A-D) 2 MG tablet Take 1 tablet (2 mg total) by mouth 2 (two) times daily as needed for up to 60 doses for diarrhea or loose stools. (Patient taking differently: Take 2 mg by mouth in the morning.) 60 tablet 0   Multiple Vitamins-Minerals (PRESERVISION AREDS 2 PO)  Take 1 tablet by mouth in the morning and at bedtime.     mupirocin ointment (BACTROBAN) 2 % Apply 1 Application topically 2 (two) times daily. 30 g 0   omeprazole (PRILOSEC) 20 MG capsule TAKE 1 CAPSULE BY MOUTH TWICE DAILY 60 capsule 1   potassium chloride (KLOR-CON) 10 MEQ tablet Take 1 tablet (10 mEq total) by mouth daily as needed. Take with torsemide 90 tablet 3   rosuvastatin (CRESTOR) 20 MG tablet Take 1 tablet (20 mg total) by mouth daily. 90 tablet 3   torsemide (DEMADEX) 20 MG tablet For weight 210 to 215 take torsemide 20 mg with potassium 10 meq. For weight > Torsemide 20 mg twice a day  (8 am and 2 pm) With potassium 20 meq 200 tablet 3   TOUJEO SOLOSTAR 300 UNIT/ML Solostar Pen INJECT 50 UNITS DAILY MAY INCREASE 2U EVERY 3 DAYS UNTIL FBG IS AT GOAL UP TO 80 UNITS DAILY 22.5 mL 6   valsartan (DIOVAN) 320 MG  tablet Take 1 tablet (320 mg total) by mouth daily. At night d/c 160s mg 90 tablet 3   ezetimibe (ZETIA) 10 MG tablet Take 1 tablet (10 mg total) by mouth daily. 90 tablet 3   No current facility-administered medications on file prior to visit.     ROS see history of present illness  Objective  Physical Exam Vitals:   10/15/21 0910  BP: 138/70  Pulse: 68  Temp: 97.9 F (36.6 C)  SpO2: 96%    BP Readings from Last 3 Encounters:  10/15/21 138/70  10/06/21 130/60  09/10/21 110/60   Wt Readings from Last 3 Encounters:  10/15/21 217 lb 3.2 oz (98.5 kg)  10/06/21 216 lb 6.4 oz (98.2 kg)  09/10/21 217 lb (98.4 kg)    Physical Exam Constitutional:      General: He is not in acute distress.    Appearance: He is not diaphoretic.  Cardiovascular:     Rate and Rhythm: Normal rate and regular rhythm.     Heart sounds: Normal heart sounds.  Pulmonary:     Effort: Pulmonary effort is normal.     Breath sounds: Normal breath sounds.  Musculoskeletal:     Comments: Trace edema bilateral lower extremities  Skin:    General: Skin is warm and dry.  Neurological:     Mental Status: He is alert.      Assessment/Plan: Please see individual problem list.  Problem List Items Addressed This Visit     Essential hypertension (Chronic)    Patient's blood pressure is quite elevated at times.  It seems as though this may be mostly before he takes his medicines.  Will have him increase his hydralazine to 25 mg 3 times daily.  He will continue Cardura 2 mg twice daily, Imdur 30 mg daily, torsemide 20 mg daily, and valsartan 320 mg daily.  He will continue to monitor his blood pressure at home.  If it does not trend down with this change he will let us know.  Discussed if he starts to have blood pressures less than 100/60 or if he starts to get lightheaded he should let us know.      Relevant Medications   hydrALAZINE (APRESOLINE) 25 MG tablet     Return in about 1 month (around  11/15/2021) for Blood pressure follow-up.   [name], MD [place]

## 2021-10-15 NOTE — Patient Instructions (Signed)
Nice to see you. We are going to increase your hydralazine to 3 times a day.  Please continue to monitor your blood pressure.  If you start to get lightheaded or have blood pressures of less than 100/60 please let me know.

## 2021-10-15 NOTE — Assessment & Plan Note (Signed)
Patient's blood pressure is quite elevated at times.  It seems as though this may be mostly before he takes his medicines.  Will have him increase his hydralazine to 25 mg 3 times daily.  He will continue Cardura 2 mg twice daily, Imdur 30 mg daily, torsemide 20 mg daily, and valsartan 320 mg daily.  He will continue to monitor his blood pressure at home.  If it does not trend down with this change he will let us know.  Discussed if he starts to have blood pressures less than 100/60 or if he starts to get lightheaded he should let us know.

## 2021-10-19 ENCOUNTER — Ambulatory Visit: Payer: Medicare Other | Admitting: Family Medicine

## 2021-10-19 DIAGNOSIS — H43821 Vitreomacular adhesion, right eye: Secondary | ICD-10-CM | POA: Diagnosis not present

## 2021-10-19 DIAGNOSIS — H353211 Exudative age-related macular degeneration, right eye, with active choroidal neovascularization: Secondary | ICD-10-CM | POA: Diagnosis not present

## 2021-10-19 DIAGNOSIS — H43813 Vitreous degeneration, bilateral: Secondary | ICD-10-CM | POA: Diagnosis not present

## 2021-10-19 DIAGNOSIS — H353121 Nonexudative age-related macular degeneration, left eye, early dry stage: Secondary | ICD-10-CM | POA: Diagnosis not present

## 2021-10-19 DIAGNOSIS — H43391 Other vitreous opacities, right eye: Secondary | ICD-10-CM | POA: Diagnosis not present

## 2021-10-26 ENCOUNTER — Other Ambulatory Visit: Payer: Self-pay

## 2021-10-27 ENCOUNTER — Other Ambulatory Visit: Payer: Self-pay

## 2021-10-27 MED ORDER — INSULIN GLARGINE (1 UNIT DIAL) 300 UNIT/ML ~~LOC~~ SOPN
PEN_INJECTOR | SUBCUTANEOUS | 3 refills | Status: DC
  Filled 2021-10-27: qty 4.5, 30d supply, fill #0
  Filled 2022-02-23: qty 13.5, 90d supply, fill #1

## 2021-10-28 ENCOUNTER — Other Ambulatory Visit: Payer: Self-pay

## 2021-11-01 DIAGNOSIS — N183 Chronic kidney disease, stage 3 unspecified: Secondary | ICD-10-CM | POA: Diagnosis not present

## 2021-11-01 DIAGNOSIS — I4892 Unspecified atrial flutter: Secondary | ICD-10-CM | POA: Diagnosis not present

## 2021-11-01 DIAGNOSIS — I5030 Unspecified diastolic (congestive) heart failure: Secondary | ICD-10-CM | POA: Diagnosis not present

## 2021-11-01 DIAGNOSIS — Z7901 Long term (current) use of anticoagulants: Secondary | ICD-10-CM | POA: Diagnosis not present

## 2021-11-01 DIAGNOSIS — E785 Hyperlipidemia, unspecified: Secondary | ICD-10-CM | POA: Diagnosis not present

## 2021-11-01 DIAGNOSIS — I130 Hypertensive heart and chronic kidney disease with heart failure and stage 1 through stage 4 chronic kidney disease, or unspecified chronic kidney disease: Secondary | ICD-10-CM | POA: Diagnosis not present

## 2021-11-01 DIAGNOSIS — Z87891 Personal history of nicotine dependence: Secondary | ICD-10-CM | POA: Diagnosis not present

## 2021-11-01 DIAGNOSIS — I2510 Atherosclerotic heart disease of native coronary artery without angina pectoris: Secondary | ICD-10-CM | POA: Diagnosis not present

## 2021-11-01 DIAGNOSIS — Z794 Long term (current) use of insulin: Secondary | ICD-10-CM | POA: Diagnosis not present

## 2021-11-01 DIAGNOSIS — M79605 Pain in left leg: Secondary | ICD-10-CM | POA: Diagnosis not present

## 2021-11-01 DIAGNOSIS — I480 Paroxysmal atrial fibrillation: Secondary | ICD-10-CM | POA: Diagnosis not present

## 2021-11-01 DIAGNOSIS — E1122 Type 2 diabetes mellitus with diabetic chronic kidney disease: Secondary | ICD-10-CM | POA: Diagnosis not present

## 2021-11-03 ENCOUNTER — Other Ambulatory Visit: Payer: Self-pay

## 2021-11-04 ENCOUNTER — Other Ambulatory Visit: Payer: Self-pay | Admitting: Cardiovascular Disease

## 2021-11-05 DIAGNOSIS — I2510 Atherosclerotic heart disease of native coronary artery without angina pectoris: Secondary | ICD-10-CM | POA: Diagnosis not present

## 2021-11-05 DIAGNOSIS — G4730 Sleep apnea, unspecified: Secondary | ICD-10-CM | POA: Diagnosis not present

## 2021-11-05 DIAGNOSIS — Z794 Long term (current) use of insulin: Secondary | ICD-10-CM | POA: Diagnosis not present

## 2021-11-05 DIAGNOSIS — N183 Chronic kidney disease, stage 3 unspecified: Secondary | ICD-10-CM | POA: Diagnosis not present

## 2021-11-05 DIAGNOSIS — Z7901 Long term (current) use of anticoagulants: Secondary | ICD-10-CM | POA: Insufficient documentation

## 2021-11-05 DIAGNOSIS — N1831 Chronic kidney disease, stage 3a: Secondary | ICD-10-CM | POA: Diagnosis not present

## 2021-11-05 DIAGNOSIS — Z87891 Personal history of nicotine dependence: Secondary | ICD-10-CM | POA: Diagnosis not present

## 2021-11-05 DIAGNOSIS — I4891 Unspecified atrial fibrillation: Secondary | ICD-10-CM | POA: Diagnosis not present

## 2021-11-05 DIAGNOSIS — I4892 Unspecified atrial flutter: Secondary | ICD-10-CM | POA: Diagnosis not present

## 2021-11-05 DIAGNOSIS — R9431 Abnormal electrocardiogram [ECG] [EKG]: Secondary | ICD-10-CM | POA: Diagnosis not present

## 2021-11-05 DIAGNOSIS — E1122 Type 2 diabetes mellitus with diabetic chronic kidney disease: Secondary | ICD-10-CM | POA: Diagnosis not present

## 2021-11-05 DIAGNOSIS — I498 Other specified cardiac arrhythmias: Secondary | ICD-10-CM | POA: Diagnosis not present

## 2021-11-05 DIAGNOSIS — I129 Hypertensive chronic kidney disease with stage 1 through stage 4 chronic kidney disease, or unspecified chronic kidney disease: Secondary | ICD-10-CM | POA: Diagnosis not present

## 2021-11-05 DIAGNOSIS — K219 Gastro-esophageal reflux disease without esophagitis: Secondary | ICD-10-CM | POA: Diagnosis not present

## 2021-11-05 DIAGNOSIS — I4510 Unspecified right bundle-branch block: Secondary | ICD-10-CM | POA: Diagnosis not present

## 2021-11-09 ENCOUNTER — Other Ambulatory Visit: Payer: Self-pay

## 2021-11-09 DIAGNOSIS — M79605 Pain in left leg: Secondary | ICD-10-CM | POA: Diagnosis not present

## 2021-11-15 ENCOUNTER — Ambulatory Visit: Payer: Medicare Other | Admitting: Family Medicine

## 2021-11-16 DIAGNOSIS — H353232 Exudative age-related macular degeneration, bilateral, with inactive choroidal neovascularization: Secondary | ICD-10-CM | POA: Diagnosis not present

## 2021-11-18 ENCOUNTER — Other Ambulatory Visit: Payer: Self-pay | Admitting: Cardiovascular Disease

## 2021-11-26 ENCOUNTER — Ambulatory Visit: Payer: Medicare Other | Admitting: Family Medicine

## 2021-12-01 ENCOUNTER — Other Ambulatory Visit: Payer: Self-pay | Admitting: Family Medicine

## 2021-12-01 ENCOUNTER — Other Ambulatory Visit: Payer: Self-pay | Admitting: Family

## 2021-12-01 DIAGNOSIS — I10 Essential (primary) hypertension: Secondary | ICD-10-CM

## 2021-12-07 ENCOUNTER — Ambulatory Visit: Payer: Medicare Other | Admitting: Cardiovascular Disease

## 2021-12-14 ENCOUNTER — Other Ambulatory Visit: Payer: Self-pay | Admitting: Family

## 2021-12-14 ENCOUNTER — Other Ambulatory Visit: Payer: Self-pay | Admitting: Nurse Practitioner

## 2021-12-15 ENCOUNTER — Telehealth: Payer: Self-pay

## 2021-12-15 NOTE — Telephone Encounter (Signed)
Called patient and left a detailed VM per DPR on file regarding the following message below:  Please advise if OK to refill or advise pt to move up appt and be seen by APP. Patient overdue for 6 month F/U appt. He has scheduled an appt for December but cancelled last two scheduled office visits.   Patient was supposed to be seen in June 2023. He is now scheduled for December 2023. I informed patient that we will need to see him prior to authorizing any refills for him and encouraged him to call us asap so that we can get him scheduled.

## 2021-12-15 NOTE — Telephone Encounter (Signed)
Please advise if OK to refill or advise pt to move up appt and be seen by APP. Patient overdue for 6 month F/U appt. He has scheduled an appt for December but cancelled last two scheduled office visits. Thank you!

## 2021-12-21 NOTE — Telephone Encounter (Signed)
To scheduling to see if there are any sooner openings to see the patient to ensure his refills are taken care of.   If so, please reach out to the patient.   Thank you!

## 2021-12-22 DIAGNOSIS — I4892 Unspecified atrial flutter: Secondary | ICD-10-CM | POA: Diagnosis not present

## 2021-12-22 DIAGNOSIS — I4819 Other persistent atrial fibrillation: Secondary | ICD-10-CM | POA: Diagnosis not present

## 2021-12-22 DIAGNOSIS — I5030 Unspecified diastolic (congestive) heart failure: Secondary | ICD-10-CM | POA: Diagnosis not present

## 2021-12-22 DIAGNOSIS — Z794 Long term (current) use of insulin: Secondary | ICD-10-CM | POA: Diagnosis not present

## 2021-12-22 DIAGNOSIS — Z951 Presence of aortocoronary bypass graft: Secondary | ICD-10-CM | POA: Diagnosis not present

## 2021-12-22 DIAGNOSIS — I10 Essential (primary) hypertension: Secondary | ICD-10-CM | POA: Diagnosis not present

## 2021-12-24 ENCOUNTER — Other Ambulatory Visit: Payer: Self-pay | Admitting: Cardiovascular Disease

## 2021-12-28 ENCOUNTER — Other Ambulatory Visit: Payer: Self-pay | Admitting: Cardiovascular Disease

## 2021-12-28 ENCOUNTER — Other Ambulatory Visit: Payer: Self-pay | Admitting: Family Medicine

## 2022-01-03 ENCOUNTER — Other Ambulatory Visit: Payer: Self-pay | Admitting: Cardiovascular Disease

## 2022-01-03 NOTE — Telephone Encounter (Signed)
Noted  

## 2022-01-03 NOTE — Telephone Encounter (Signed)
Eli Phillips  Sent: Mon January 03, 2022  3:59 PM  To: Emily Filbert, RN          Message  Lvm to call and schedule sooner appt/added to waitlist

## 2022-01-05 DIAGNOSIS — H353211 Exudative age-related macular degeneration, right eye, with active choroidal neovascularization: Secondary | ICD-10-CM | POA: Diagnosis not present

## 2022-01-05 DIAGNOSIS — H353121 Nonexudative age-related macular degeneration, left eye, early dry stage: Secondary | ICD-10-CM | POA: Diagnosis not present

## 2022-01-05 DIAGNOSIS — H43813 Vitreous degeneration, bilateral: Secondary | ICD-10-CM | POA: Diagnosis not present

## 2022-01-05 DIAGNOSIS — H43821 Vitreomacular adhesion, right eye: Secondary | ICD-10-CM | POA: Diagnosis not present

## 2022-01-07 ENCOUNTER — Other Ambulatory Visit: Payer: Self-pay

## 2022-01-12 ENCOUNTER — Telehealth: Payer: Self-pay | Admitting: Family Medicine

## 2022-01-12 ENCOUNTER — Telehealth: Payer: Self-pay | Admitting: Cardiovascular Disease

## 2022-01-12 NOTE — Telephone Encounter (Signed)
Pt came into the office to drop off a patient assistance form. Placed in provider folder

## 2022-01-12 NOTE — Telephone Encounter (Signed)
Late Entry:  Fax received from [place] Patient assistance stating "Missing Information Notification" for Farxiga received on 01/10/22.  Refill request  Reviewed the patient's chart on 01/11/22- he has not been seen in our Cardiology Clinic since 01/26/21 with Dr. Rockey Situ.  He was last seen by [facility] with Dr. Janeece Riggers on 12/22/21.  Review of [facility] notes states: Patient initially seen in [facility] clinic 06/17/2021 by Dr. Viona Gilmore. [name] [name] as a self-referral for evaluation of complex CAD and HF. Patient has seen Dr. Esmond Plants at [facility] for complex CAD s/p CABG and PCI. He had multiple PCIs in the late 1990s and early 2000s. He then underwent CABGx4 (SVGs-PDA, OM, Diag & LIMA-LAD) at [facility] November 2006. He did well for many years, then presented with SOB and CP and underwent LHC in late 2022 at [facility]. He then underwent attempted PCI of SVG-RCA and this resulted in shutting down the vein graft. He describes that Dr. Rockey Situ spoke with partners at [facility] and the decision was to treat him medically. Patient stated he was started on meds including diuretics (which he takes when his weight goes up). [name] for T2DM and had been told that he may need it for his heart by Dr. Rockey Situ.   I faxed back a notification to [place] on 01/11/22 stating the patient is currently being followed by [facility] and not Dr. Rockey Situ, therefore we can not refill his medication.

## 2022-01-13 NOTE — Telephone Encounter (Signed)
Form was placed in the sign basket.  Eathan Groman,cma

## 2022-01-14 NOTE — Telephone Encounter (Signed)
Can you confirm that this is for his toujeo?

## 2022-01-17 ENCOUNTER — Emergency Department (HOSPITAL_COMMUNITY)
Admission: EM | Admit: 2022-01-17 | Discharge: 2022-01-17 | Disposition: A | Discharge status: Home / Self Care | Payer: Medicare Other | Attending: Emergency Medicine | Admitting: Emergency Medicine

## 2022-01-17 ENCOUNTER — Encounter (HOSPITAL_COMMUNITY): Payer: Self-pay

## 2022-01-17 ENCOUNTER — Emergency Department (HOSPITAL_COMMUNITY): Payer: Medicare Other

## 2022-01-17 DIAGNOSIS — W19XXXA Unspecified fall, initial encounter: Secondary | ICD-10-CM | POA: Diagnosis not present

## 2022-01-17 DIAGNOSIS — S6991XA Unspecified injury of right wrist, hand and finger(s), initial encounter: Secondary | ICD-10-CM | POA: Insufficient documentation

## 2022-01-17 DIAGNOSIS — Z743 Need for continuous supervision: Secondary | ICD-10-CM | POA: Diagnosis not present

## 2022-01-17 DIAGNOSIS — Z7901 Long term (current) use of anticoagulants: Secondary | ICD-10-CM | POA: Diagnosis not present

## 2022-01-17 DIAGNOSIS — Z794 Long term (current) use of insulin: Secondary | ICD-10-CM | POA: Diagnosis not present

## 2022-01-17 DIAGNOSIS — E119 Type 2 diabetes mellitus without complications: Secondary | ICD-10-CM | POA: Insufficient documentation

## 2022-01-17 DIAGNOSIS — I4891 Unspecified atrial fibrillation: Secondary | ICD-10-CM | POA: Diagnosis not present

## 2022-01-17 DIAGNOSIS — S6721XA Crushing injury of right hand, initial encounter: Secondary | ICD-10-CM | POA: Diagnosis not present

## 2022-01-17 DIAGNOSIS — Z7982 Long term (current) use of aspirin: Secondary | ICD-10-CM | POA: Insufficient documentation

## 2022-01-17 DIAGNOSIS — R001 Bradycardia, unspecified: Secondary | ICD-10-CM | POA: Diagnosis not present

## 2022-01-17 DIAGNOSIS — Z79899 Other long term (current) drug therapy: Secondary | ICD-10-CM | POA: Insufficient documentation

## 2022-01-17 DIAGNOSIS — W231XXA Caught, crushed, jammed, or pinched between stationary objects, initial encounter: Secondary | ICD-10-CM | POA: Diagnosis not present

## 2022-01-17 DIAGNOSIS — I10 Essential (primary) hypertension: Secondary | ICD-10-CM | POA: Insufficient documentation

## 2022-01-17 DIAGNOSIS — Y92512 Supermarket, store or market as the place of occurrence of the external cause: Secondary | ICD-10-CM | POA: Diagnosis not present

## 2022-01-17 DIAGNOSIS — I499 Cardiac arrhythmia, unspecified: Secondary | ICD-10-CM | POA: Diagnosis not present

## 2022-01-17 MED ORDER — HYDROCODONE-ACETAMINOPHEN 5-325 MG PO TABS
1.0000 | ORAL_TABLET | Freq: Once | ORAL | Status: AC
  Administered 2022-01-17: 1 via ORAL
  Filled 2022-01-17: qty 1

## 2022-01-17 MED ORDER — CEFAZOLIN SODIUM-DEXTROSE 2-4 GM/100ML-% IV SOLN
2.0000 g | Freq: Once | INTRAVENOUS | Status: AC
  Administered 2022-01-17: 2 g via INTRAVENOUS
  Filled 2022-01-17: qty 100

## 2022-01-17 MED ORDER — HYDROCODONE-ACETAMINOPHEN 5-325 MG PO TABS: 1.0000 | ORAL_TABLET | Freq: Four times a day (QID) | ORAL | 0 refills | Status: DC | PRN

## 2022-01-17 MED ORDER — CEPHALEXIN 500 MG PO CAPS: 500.0000 mg | ORAL_CAPSULE | Freq: Three times a day (TID) | ORAL | 0 refills | Status: DC
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
## 2022-01-17 NOTE — Telephone Encounter (Signed)
LVM for patient to call back.   Robson Trickey,cma  

## 2022-01-17 NOTE — Discharge Instructions (Addendum)
Worsening home with a prescription for an antibiotic called Keflex.  Please take this 3 times a day for a total of 10 days.   You can look for bacitracin ointment over the counter.  Please change the dressings 1-2 times per day and apply a new layer of bacitracin gel under the new dressing.  Please make an appointment to see Dr. Ala Bent in his hand surgery clinic for a wound check in about 1 week.    Please return to the emergency department if you develop fevers, difficulty moving your hand, changes in sensation, pus draining from the wound, or if you have any other reason to think you need emergency care.  We hope you feel better soon.

## 2022-01-17 NOTE — Telephone Encounter (Signed)
Pt called back stating yes that is the name of the medication
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
## 2022-01-18 NOTE — Telephone Encounter (Signed)
Signed and filled out. 

## 2022-01-19 NOTE — Telephone Encounter (Signed)
Faxed and confirmation given .  Magenta Schmiesing,cma

## 2022-01-20 DIAGNOSIS — I509 Heart failure, unspecified: Secondary | ICD-10-CM | POA: Diagnosis not present

## 2022-01-20 DIAGNOSIS — I2510 Atherosclerotic heart disease of native coronary artery without angina pectoris: Secondary | ICD-10-CM | POA: Diagnosis not present

## 2022-01-20 DIAGNOSIS — Z951 Presence of aortocoronary bypass graft: Secondary | ICD-10-CM | POA: Diagnosis not present

## 2022-01-21 ENCOUNTER — Telehealth: Payer: Self-pay

## 2022-01-21 DIAGNOSIS — S6721XA Crushing injury of right hand, initial encounter: Secondary | ICD-10-CM | POA: Diagnosis not present

## 2022-01-21 NOTE — Telephone Encounter (Signed)
Patient came into the office to pick up his Medication Solostar from Patient Assistance @ 4:30pm on 01/21/22

## 2022-01-21 NOTE — Telephone Encounter (Signed)
I called the patient and informed him that his Nelva Nay has arrived and is available for pickup and he understood and will pick up today.  Brylin Stanislawski,cma

## 2022-01-24 DIAGNOSIS — B078 Other viral warts: Secondary | ICD-10-CM | POA: Diagnosis not present

## 2022-01-24 DIAGNOSIS — L570 Actinic keratosis: Secondary | ICD-10-CM | POA: Diagnosis not present

## 2022-01-24 DIAGNOSIS — L814 Other melanin hyperpigmentation: Secondary | ICD-10-CM | POA: Diagnosis not present

## 2022-01-24 DIAGNOSIS — L821 Other seborrheic keratosis: Secondary | ICD-10-CM | POA: Diagnosis not present

## 2022-01-24 DIAGNOSIS — Z85828 Personal history of other malignant neoplasm of skin: Secondary | ICD-10-CM | POA: Diagnosis not present

## 2022-01-24 DIAGNOSIS — D2261 Melanocytic nevi of right upper limb, including shoulder: Secondary | ICD-10-CM | POA: Diagnosis not present

## 2022-01-24 DIAGNOSIS — D225 Melanocytic nevi of trunk: Secondary | ICD-10-CM | POA: Diagnosis not present

## 2022-01-24 DIAGNOSIS — L720 Epidermal cyst: Secondary | ICD-10-CM | POA: Diagnosis not present

## 2022-01-24 DIAGNOSIS — D1801 Hemangioma of skin and subcutaneous tissue: Secondary | ICD-10-CM | POA: Diagnosis not present

## 2022-01-25 ENCOUNTER — Telehealth: Payer: Self-pay

## 2022-01-25 NOTE — Telephone Encounter (Signed)
Patient states he has been out of the following medications for a week:  gabapentin (NEURONTIN) 100 MG capsule omeprazole (PRILOSEC) 20 MG capsule  Patient states [facility] has reached out to [place], but has not heard back from [place].  Patient states please let him know if it's a problem for [place] to go ahead and have these refilled, if so, he will find a new doctor.

## 2022-01-26 ENCOUNTER — Observation Stay: Payer: Medicare Other

## 2022-01-26 ENCOUNTER — Other Ambulatory Visit: Payer: Self-pay

## 2022-01-26 ENCOUNTER — Observation Stay (HOSPITAL_COMMUNITY)
Admit: 2022-01-26 | Discharge: 2022-01-26 | Disposition: A | Discharge status: Home / Self Care | Payer: Medicare Other | Attending: Neurology | Admitting: Neurology

## 2022-01-26 ENCOUNTER — Inpatient Hospital Stay
Admission: EM | Admit: 2022-01-26 | Discharge: 2022-01-31 | DRG: 065 | Disposition: A | Discharge status: Home Health | Payer: Medicare Other | Attending: Internal Medicine | Admitting: Internal Medicine

## 2022-01-26 ENCOUNTER — Emergency Department: Payer: Medicare Other

## 2022-01-26 ENCOUNTER — Encounter: Payer: Self-pay | Admitting: Internal Medicine

## 2022-01-26 DIAGNOSIS — N1832 Chronic kidney disease, stage 3b: Secondary | ICD-10-CM | POA: Diagnosis not present

## 2022-01-26 DIAGNOSIS — D696 Thrombocytopenia, unspecified: Secondary | ICD-10-CM | POA: Diagnosis present

## 2022-01-26 DIAGNOSIS — M6281 Muscle weakness (generalized): Secondary | ICD-10-CM

## 2022-01-26 DIAGNOSIS — Z801 Family history of malignant neoplasm of trachea, bronchus and lung: Secondary | ICD-10-CM

## 2022-01-26 DIAGNOSIS — N189 Chronic kidney disease, unspecified: Secondary | ICD-10-CM | POA: Diagnosis present

## 2022-01-26 DIAGNOSIS — Z8673 Personal history of transient ischemic attack (TIA), and cerebral infarction without residual deficits: Secondary | ICD-10-CM | POA: Diagnosis present

## 2022-01-26 DIAGNOSIS — I6601 Occlusion and stenosis of right middle cerebral artery: Secondary | ICD-10-CM | POA: Diagnosis not present

## 2022-01-26 DIAGNOSIS — Z9049 Acquired absence of other specified parts of digestive tract: Secondary | ICD-10-CM

## 2022-01-26 DIAGNOSIS — I6389 Other cerebral infarction: Secondary | ICD-10-CM

## 2022-01-26 DIAGNOSIS — Z8249 Family history of ischemic heart disease and other diseases of the circulatory system: Secondary | ICD-10-CM | POA: Diagnosis not present

## 2022-01-26 DIAGNOSIS — Z9841 Cataract extraction status, right eye: Secondary | ICD-10-CM

## 2022-01-26 DIAGNOSIS — I4891 Unspecified atrial fibrillation: Secondary | ICD-10-CM

## 2022-01-26 DIAGNOSIS — D631 Anemia in chronic kidney disease: Secondary | ICD-10-CM | POA: Diagnosis not present

## 2022-01-26 DIAGNOSIS — E1165 Type 2 diabetes mellitus with hyperglycemia: Secondary | ICD-10-CM | POA: Diagnosis not present

## 2022-01-26 DIAGNOSIS — E875 Hyperkalemia: Secondary | ICD-10-CM | POA: Diagnosis not present

## 2022-01-26 DIAGNOSIS — S6721XS Crushing injury of right hand, sequela: Secondary | ICD-10-CM

## 2022-01-26 DIAGNOSIS — Z9842 Cataract extraction status, left eye: Secondary | ICD-10-CM

## 2022-01-26 DIAGNOSIS — Z7901 Long term (current) use of anticoagulants: Secondary | ICD-10-CM

## 2022-01-26 DIAGNOSIS — Z9861 Coronary angioplasty status: Secondary | ICD-10-CM

## 2022-01-26 DIAGNOSIS — N400 Enlarged prostate without lower urinary tract symptoms: Secondary | ICD-10-CM | POA: Diagnosis present

## 2022-01-26 DIAGNOSIS — I130 Hypertensive heart and chronic kidney disease with heart failure and stage 1 through stage 4 chronic kidney disease, or unspecified chronic kidney disease: Secondary | ICD-10-CM | POA: Diagnosis present

## 2022-01-26 DIAGNOSIS — Z7982 Long term (current) use of aspirin: Secondary | ICD-10-CM

## 2022-01-26 DIAGNOSIS — S6721XA Crushing injury of right hand, initial encounter: Secondary | ICD-10-CM | POA: Diagnosis present

## 2022-01-26 DIAGNOSIS — I63319 Cerebral infarction due to thrombosis of unspecified middle cerebral artery: Secondary | ICD-10-CM

## 2022-01-26 DIAGNOSIS — I6523 Occlusion and stenosis of bilateral carotid arteries: Secondary | ICD-10-CM | POA: Diagnosis not present

## 2022-01-26 DIAGNOSIS — Z794 Long term (current) use of insulin: Secondary | ICD-10-CM

## 2022-01-26 DIAGNOSIS — Z951 Presence of aortocoronary bypass graft: Secondary | ICD-10-CM

## 2022-01-26 DIAGNOSIS — I10 Essential (primary) hypertension: Secondary | ICD-10-CM | POA: Diagnosis present

## 2022-01-26 DIAGNOSIS — G8194 Hemiplegia, unspecified affecting left nondominant side: Secondary | ICD-10-CM | POA: Diagnosis not present

## 2022-01-26 DIAGNOSIS — R29706 NIHSS score 6: Secondary | ICD-10-CM | POA: Diagnosis present

## 2022-01-26 DIAGNOSIS — K219 Gastro-esophageal reflux disease without esophagitis: Secondary | ICD-10-CM | POA: Diagnosis present

## 2022-01-26 DIAGNOSIS — I5032 Chronic diastolic (congestive) heart failure: Secondary | ICD-10-CM | POA: Diagnosis present

## 2022-01-26 DIAGNOSIS — Z8616 Personal history of COVID-19: Secondary | ICD-10-CM | POA: Diagnosis not present

## 2022-01-26 DIAGNOSIS — I2510 Atherosclerotic heart disease of native coronary artery without angina pectoris: Secondary | ICD-10-CM | POA: Diagnosis not present

## 2022-01-26 DIAGNOSIS — I639 Cerebral infarction, unspecified: Secondary | ICD-10-CM | POA: Diagnosis not present

## 2022-01-26 DIAGNOSIS — R4781 Slurred speech: Secondary | ICD-10-CM | POA: Diagnosis not present

## 2022-01-26 DIAGNOSIS — N179 Acute kidney failure, unspecified: Secondary | ICD-10-CM | POA: Diagnosis present

## 2022-01-26 DIAGNOSIS — R471 Dysarthria and anarthria: Secondary | ICD-10-CM | POA: Diagnosis present

## 2022-01-26 DIAGNOSIS — W230XXA Caught, crushed, jammed, or pinched between moving objects, initial encounter: Secondary | ICD-10-CM | POA: Diagnosis present

## 2022-01-26 DIAGNOSIS — R200 Anesthesia of skin: Secondary | ICD-10-CM | POA: Diagnosis not present

## 2022-01-26 DIAGNOSIS — R531 Weakness: Secondary | ICD-10-CM | POA: Diagnosis not present

## 2022-01-26 DIAGNOSIS — R29810 Facial weakness: Secondary | ICD-10-CM | POA: Diagnosis present

## 2022-01-26 DIAGNOSIS — E1122 Type 2 diabetes mellitus with diabetic chronic kidney disease: Secondary | ICD-10-CM | POA: Diagnosis present

## 2022-01-26 DIAGNOSIS — S61411A Laceration without foreign body of right hand, initial encounter: Secondary | ICD-10-CM | POA: Diagnosis present

## 2022-01-26 DIAGNOSIS — I482 Chronic atrial fibrillation, unspecified: Secondary | ICD-10-CM | POA: Diagnosis not present

## 2022-01-26 DIAGNOSIS — I4821 Permanent atrial fibrillation: Secondary | ICD-10-CM | POA: Diagnosis not present

## 2022-01-26 DIAGNOSIS — E7800 Pure hypercholesterolemia, unspecified: Secondary | ICD-10-CM | POA: Diagnosis not present

## 2022-01-26 DIAGNOSIS — R29818 Other symptoms and signs involving the nervous system: Secondary | ICD-10-CM | POA: Diagnosis not present

## 2022-01-26 DIAGNOSIS — Z961 Presence of intraocular lens: Secondary | ICD-10-CM | POA: Diagnosis present

## 2022-01-26 DIAGNOSIS — E118 Type 2 diabetes mellitus with unspecified complications: Secondary | ICD-10-CM | POA: Diagnosis present

## 2022-01-26 DIAGNOSIS — E119 Type 2 diabetes mellitus without complications: Secondary | ICD-10-CM | POA: Diagnosis present

## 2022-01-26 LAB — DIFFERENTIAL
Abs Immature Granulocytes: 0.02 10*3/uL (ref 0.00–0.07)
Basophils Absolute: 0 10*3/uL (ref 0.0–0.1)
Basophils Relative: 1 %
Eosinophils Absolute: 0.1 10*3/uL (ref 0.0–0.5)
Eosinophils Relative: 2 %
Immature Granulocytes: 0 %
Lymphocytes Relative: 14 %
Lymphs Abs: 0.9 10*3/uL (ref 0.7–4.0)
Monocytes Absolute: 0.5 10*3/uL (ref 0.1–1.0)
Monocytes Relative: 7 %
Neutro Abs: 5 10*3/uL (ref 1.7–7.7)
Neutrophils Relative %: 76 %

## 2022-01-26 LAB — PROTIME-INR
INR: 1.6 — ABNORMAL HIGH (ref 0.8–1.2)
Prothrombin Time: 18.5 seconds — ABNORMAL HIGH (ref 11.4–15.2)

## 2022-01-26 LAB — COMPREHENSIVE METABOLIC PANEL
ALT: 18 U/L (ref 0–44)
AST: 25 U/L (ref 15–41)
Albumin: 3.3 g/dL — ABNORMAL LOW (ref 3.5–5.0)
Alkaline Phosphatase: 108 U/L (ref 38–126)
Anion gap: 6 (ref 5–15)
BUN: 51 mg/dL — ABNORMAL HIGH (ref 8–23)
CO2: 21 mmol/L — ABNORMAL LOW (ref 22–32)
Calcium: 8.4 mg/dL — ABNORMAL LOW (ref 8.9–10.3)
Chloride: 111 mmol/L (ref 98–111)
Creatinine, Ser: 2.69 mg/dL — ABNORMAL HIGH (ref 0.61–1.24)
GFR, Estimated: 22 mL/min — ABNORMAL LOW (ref >60)
Glucose, Bld: 278 mg/dL — ABNORMAL HIGH (ref 70–99)
Potassium: 5.4 mmol/L — ABNORMAL HIGH (ref 3.5–5.1)
Sodium: 138 mmol/L (ref 135–145)
Total Bilirubin: 1 mg/dL (ref 0.3–1.2)
Total Protein: 5.9 g/dL — ABNORMAL LOW (ref 6.5–8.1)

## 2022-01-26 LAB — CBG MONITORING, ED: Glucose-Capillary: 294 mg/dL — ABNORMAL HIGH (ref 70–99)

## 2022-01-26 LAB — GLUCOSE, CAPILLARY
Glucose-Capillary: 164 mg/dL — ABNORMAL HIGH (ref 70–99)
Glucose-Capillary: 132 mg/dL — ABNORMAL HIGH (ref 70–99)

## 2022-01-26 LAB — BASIC METABOLIC PANEL
Anion gap: 5 (ref 5–15)
BUN: 49 mg/dL — ABNORMAL HIGH (ref 8–23)
CO2: 20 mmol/L — ABNORMAL LOW (ref 22–32)
Calcium: 8.3 mg/dL — ABNORMAL LOW (ref 8.9–10.3)
Chloride: 112 mmol/L — ABNORMAL HIGH (ref 98–111)
Creatinine, Ser: 2.52 mg/dL — ABNORMAL HIGH (ref 0.61–1.24)
GFR, Estimated: 24 mL/min — ABNORMAL LOW (ref >60)
Glucose, Bld: 217 mg/dL — ABNORMAL HIGH (ref 70–99)
Potassium: 5.4 mmol/L — ABNORMAL HIGH (ref 3.5–5.1)
Sodium: 137 mmol/L (ref 135–145)

## 2022-01-26 LAB — LIPID PANEL
Cholesterol: 81 mg/dL (ref 0–200)
HDL: 26 mg/dL — ABNORMAL LOW (ref >40)
LDL Cholesterol: 33 mg/dL (ref 0–99)
Total CHOL/HDL Ratio: 3.1 RATIO
Triglycerides: 112 mg/dL (ref <150)
VLDL: 22 mg/dL (ref 0–40)

## 2022-01-26 LAB — TSH: TSH: 3.245 u[IU]/mL (ref 0.350–4.500)

## 2022-01-26 LAB — CBC
HCT: 34.9 % — ABNORMAL LOW (ref 39.0–52.0)
Hemoglobin: 12.1 g/dL — ABNORMAL LOW (ref 13.0–17.0)
MCH: 31.5 pg (ref 26.0–34.0)
MCHC: 34.7 g/dL (ref 30.0–36.0)
MCV: 90.9 fL (ref 80.0–100.0)
Platelets: 97 10*3/uL — ABNORMAL LOW (ref 150–400)
RBC: 3.84 MIL/uL — ABNORMAL LOW (ref 4.22–5.81)
RDW: 12.8 % (ref 11.5–15.5)
WBC: 6.5 10*3/uL (ref 4.0–10.5)
nRBC: 0 % (ref 0.0–0.2)

## 2022-01-26 LAB — APTT: aPTT: 35 seconds (ref 24–36)

## 2022-01-26 LAB — ETHANOL: Alcohol, Ethyl (B): <10 mg/dL (ref <10)

## 2022-01-26 MED ORDER — SODIUM CHLORIDE 0.9 % IV BOLUS
500.0000 mL | Freq: Once | INTRAVENOUS | Status: AC
  Administered 2022-01-26: 500 mL via INTRAVENOUS

## 2022-01-26 MED ORDER — SODIUM CHLORIDE 0.9% FLUSH
3.0000 mL | Freq: Two times a day (BID) | INTRAVENOUS | Status: DC
  Administered 2022-01-26 – 2022-01-31 (×10): 3 mL via INTRAVENOUS

## 2022-01-26 MED ORDER — ASPIRIN 81 MG PO TBEC
81.0000 mg | DELAYED_RELEASE_TABLET | Freq: Every day | ORAL | Status: DC
  Administered 2022-01-27 – 2022-01-31 (×5): 81 mg via ORAL
  Filled 2022-01-26 (×6): qty 1

## 2022-01-26 MED ORDER — LACTATED RINGERS IV SOLN: INTRAVENOUS | Status: DC

## 2022-01-26 MED ORDER — STROKE: EARLY STAGES OF RECOVERY BOOK: Freq: Once | Status: AC

## 2022-01-26 MED ORDER — FINASTERIDE 5 MG PO TABS
5.0000 mg | ORAL_TABLET | Freq: Every evening | ORAL | Status: DC
  Administered 2022-01-26 – 2022-01-30 (×5): 5 mg via ORAL
  Filled 2022-01-26 (×6): qty 1

## 2022-01-26 MED ORDER — EZETIMIBE 10 MG PO TABS
10.0000 mg | ORAL_TABLET | Freq: Every day | ORAL | Status: DC
  Administered 2022-01-27 – 2022-01-31 (×5): 10 mg via ORAL
  Filled 2022-01-26 (×5): qty 1

## 2022-01-26 MED ORDER — GABAPENTIN 100 MG PO CAPS: 100.0000 mg | ORAL_CAPSULE | Freq: Three times a day (TID) | ORAL | 1 refills | Status: DC

## 2022-01-26 MED ORDER — POLYETHYLENE GLYCOL 3350 17 G PO PACK
17.0000 g | PACK | Freq: Every day | ORAL | Status: DC | PRN
  Administered 2022-01-29: 17 g via ORAL
  Filled 2022-01-26: qty 1

## 2022-01-26 MED ORDER — ONDANSETRON HCL 4 MG/2ML IJ SOLN: 4.0000 mg | Freq: Four times a day (QID) | INTRAMUSCULAR | Status: DC | PRN

## 2022-01-26 MED ORDER — SODIUM ZIRCONIUM CYCLOSILICATE 10 G PO PACK
10.0000 g | PACK | Freq: Once | ORAL | Status: AC
  Administered 2022-01-26: 10 g via ORAL
  Filled 2022-01-26: qty 1

## 2022-01-26 MED ORDER — ROSUVASTATIN CALCIUM 20 MG PO TABS
20.0000 mg | ORAL_TABLET | Freq: Every day | ORAL | Status: DC
  Administered 2022-01-26 – 2022-01-31 (×6): 20 mg via ORAL
  Filled 2022-01-26 (×7): qty 1

## 2022-01-26 MED ORDER — ONDANSETRON HCL 4 MG PO TABS: 4.0000 mg | ORAL_TABLET | Freq: Four times a day (QID) | ORAL | Status: DC | PRN

## 2022-01-26 MED ORDER — PANTOPRAZOLE SODIUM 40 MG PO TBEC
40.0000 mg | DELAYED_RELEASE_TABLET | Freq: Every day | ORAL | Status: DC
  Administered 2022-01-26 – 2022-01-31 (×6): 40 mg via ORAL
  Filled 2022-01-26 (×6): qty 1

## 2022-01-26 MED ORDER — SODIUM CHLORIDE 0.9% FLUSH
3.0000 mL | Freq: Once | INTRAVENOUS | Status: AC
  Administered 2022-01-26: 3 mL via INTRAVENOUS

## 2022-01-26 MED ORDER — ACETAMINOPHEN 325 MG PO TABS
650.0000 mg | ORAL_TABLET | Freq: Four times a day (QID) | ORAL | Status: DC | PRN
  Administered 2022-01-27 – 2022-01-30 (×4): 650 mg via ORAL
  Filled 2022-01-26 (×4): qty 2

## 2022-01-26 MED ORDER — ADULT MULTIVITAMIN W/MINERALS CH
1.0000 | ORAL_TABLET | Freq: Every day | ORAL | Status: DC
  Administered 2022-01-26 – 2022-01-31 (×6): 1 via ORAL
  Filled 2022-01-26 (×5): qty 1

## 2022-01-26 MED ORDER — INSULIN ASPART 100 UNIT/ML IJ SOLN
0.0000 [IU] | Freq: Three times a day (TID) | INTRAMUSCULAR | Status: DC
  Administered 2022-01-26: 3 [IU] via SUBCUTANEOUS
  Administered 2022-01-27: 5 [IU] via SUBCUTANEOUS
  Administered 2022-01-27: 11 [IU] via SUBCUTANEOUS
  Administered 2022-01-28 – 2022-01-29 (×2): 5 [IU] via SUBCUTANEOUS
  Administered 2022-01-29: 2 [IU] via SUBCUTANEOUS
  Administered 2022-01-30: 3 [IU] via SUBCUTANEOUS
  Administered 2022-01-30: 5 [IU] via SUBCUTANEOUS
  Administered 2022-01-31: 3 [IU] via SUBCUTANEOUS
  Administered 2022-01-31: 5 [IU] via SUBCUTANEOUS
  Filled 2022-01-26 (×10): qty 1

## 2022-01-26 MED ORDER — IOHEXOL 350 MG/ML SOLN
75.0000 mL | Freq: Once | INTRAVENOUS | Status: AC | PRN
  Administered 2022-01-26: 75 mL via INTRAVENOUS

## 2022-01-26 MED ORDER — INSULIN GLARGINE-YFGN 100 UNIT/ML ~~LOC~~ SOLN
20.0000 [IU] | Freq: Every day | SUBCUTANEOUS | Status: DC
  Administered 2022-01-26 – 2022-01-30 (×5): 20 [IU] via SUBCUTANEOUS
  Filled 2022-01-26 (×6): qty 0.2

## 2022-01-26 MED ORDER — OMEPRAZOLE 20 MG PO CPDR: 20.0000 mg | DELAYED_RELEASE_CAPSULE | Freq: Two times a day (BID) | ORAL | 1 refills | Status: DC

## 2022-01-26 MED ORDER — ACETAMINOPHEN 650 MG RE SUPP: 650.0000 mg | Freq: Four times a day (QID) | RECTAL | Status: DC | PRN

## 2022-01-26 MED ORDER — HYDROCODONE-ACETAMINOPHEN 5-325 MG PO TABS
1.0000 | ORAL_TABLET | Freq: Three times a day (TID) | ORAL | Status: DC | PRN
  Administered 2022-01-27 – 2022-01-31 (×7): 1 via ORAL
  Filled 2022-01-26 (×7): qty 1

## 2022-01-26 MED ORDER — GABAPENTIN 100 MG PO CAPS
100.0000 mg | ORAL_CAPSULE | Freq: Three times a day (TID) | ORAL | Status: DC
  Administered 2022-01-26 – 2022-01-31 (×15): 100 mg via ORAL
  Filled 2022-01-26 (×15): qty 1

## 2022-01-26 MED ORDER — AMIODARONE HCL 200 MG PO TABS
200.0000 mg | ORAL_TABLET | Freq: Every day | ORAL | Status: DC
  Administered 2022-01-27 – 2022-01-31 (×5): 200 mg via ORAL
  Filled 2022-01-26 (×5): qty 1

## 2022-01-26 NOTE — Progress Notes (Signed)
SLP Cancellation Note  Patient Details Name: [name] [name] MRN: [id number] DOB: 1933-09-09   Cancelled treatment:       Reason Eval/Treat Not Completed: Patient at procedure or test/unavailable Pt at MRI. Will attempt at next available time.    Nykira Reddix 01/26/2022, 4:31 PM

## 2022-01-26 NOTE — ED Notes (Signed)
Patient receiving bedside echo at this time

## 2022-01-26 NOTE — Assessment & Plan Note (Addendum)
-   Counseled to avoid NSAIDs - Norco as needed for pain control - Continue wound care - Continue outpatient follow-up with orthopedic surgery

## 2022-01-26 NOTE — Assessment & Plan Note (Signed)
Patient has a complicated history of CAD s/p CABG with PCI in 3700 complicated by unsuccessful PCI to SVG to PDA with subsequent occluded flow.  - Continue home aspirin, rosuvastatin and Zetia - Holding home Eliquis

## 2022-01-26 NOTE — Assessment & Plan Note (Signed)
-   Hold home antihypertensives to allow for permissive hypertension

## 2022-01-26 NOTE — Assessment & Plan Note (Signed)
Patient has a history of atrial fibrillation that is persistent with occasional typical atrial flutter.  He is anticoagulated with Eliquis and on carvedilol and amiodarone.  Amiodarone was started in October 2023.  Today, EKG and telemetry demonstrates slow ventricular response.  - Holding home carvedilol in the setting of permissive hypertension - Holding home Eliquis until MRI results - Continue amiodarone - Check TSH

## 2022-01-26 NOTE — Progress Notes (Signed)
PT Cancellation Note  Patient Details Name: [name] [name] MRN: [id number] DOB: 07-18-1933   Cancelled Treatment:    Reason Eval/Treat Not Completed: Patient at procedure or test/unavailable Pt just taken to MRI from ED, slated to go to 129 afterwards.  Per availability, will likely need to initiate PT exam tomorrow.    Kreg Shropshire, DPT 01/26/2022, 2:43 PM

## 2022-01-26 NOTE — Assessment & Plan Note (Signed)
Patient is euvolemic on examination.    - Given AKI, will hold diuretics and GDMT.
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
## 2022-01-26 NOTE — Assessment & Plan Note (Addendum)
Patient presenting with sudden onset weakness and altered speech with negative CT head and CTA with no LVO.  Neurology has been consulted.  Patient has a history of atrial fibrillation and taking Eliquis with no missed doses.  - Neurology following; appreciate their recommendations - MRI brain pending - TTE ordered - PT/OT/SLP - Holding home Eliquis until MRI results - Continue home aspirin - Permissive hypertension - A1c and lipid panel pending  ADDENDUM:  MRI with small infarct. Neurology recommending holding Eliquis for 3 days then obtaining repeat CT head. If no hemorrhagic conversion, can restart Eliquis at that time.

## 2022-01-26 NOTE — ED Notes (Signed)
Pt leaves with transport - pt alert and oriented upon d/c. VSS  All belongings sent with patient.

## 2022-01-26 NOTE — Assessment & Plan Note (Signed)
Per chart review, longstanding history of mild to moderate thrombocytopenia of unknown etiology dating back at least 10 years.  Stable at this time.

## 2022-01-26 NOTE — ED Notes (Signed)
First nurse note: Pt to ED POV brought by wife At 0900 pt had sudden onset of L arm and leg numbness and dysarthria. Touching pt leg pt states it feels "like it's not there". CBG 294. Code stroke called.

## 2022-01-26 NOTE — Assessment & Plan Note (Addendum)
-   Hold home Toujeo (40 units) and Farxiga - SSI, moderate - Semglee 20 units at bedtime - A1c pending

## 2022-01-26 NOTE — Evaluation (Addendum)
Occupational Therapy Evaluation Patient Details Name: [name] [name] MRN: [id number] DOB: 1933-09-19 Today's Date: 01/26/2022   History of Present Illness [name] [name] is a 88 y.o. male with medical history significant of atrial fibrillation on Eliquis, complex CAD s/p CABG, HFpEF, type 2 diabetes, CKD stage III, hypertension, who presents to the ED with complaints of left leg weakness and speech changes. 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  No hemorrhage."   Clinical Impression   Patient presenting with decreased independence in self care, balance, functional mobility/transfers, and endurance. Prior to admission pt was independent with ADLs, IADLs, and functional mobility without an AD (RW PRN for community distances). During evaluation, pt able to stand from EOB with supervision, complete functional mobility at room level with Min guard using RW, and clothing management in standing with supervision. Pt A&Ox4 and able to follow all commands well. Pt with a crush injury to R hand from ~2 weeks and demonstrated strength/coordination deficits in LUE (see details below). Pt will benefit from acute OT to increase overall independence in the areas of ADLs and functional mobility in order to safely discharge home. Pt could benefit from [facility] following D/C to decrease falls risk, improve balance, and maximize independence in self-care within own home environment.        Recommendations for follow up therapy are one component of a multi-disciplinary discharge planning process, led by the attending physician.  Recommendations may be updated based on patient status, additional functional criteria and insurance authorization.   Follow Up Recommendations  Home health OT     Assistance Recommended at Discharge Intermittent Supervision/Assistance  Patient can return home with the following A little help with walking and/or transfers;A little help with  bathing/dressing/bathroom;Assistance with cooking/housework;Assist for transportation;Help with stairs or ramp for entrance    Functional Status Assessment  Patient has had a recent decline in their functional status and demonstrates the ability to make significant improvements in function in a reasonable and predictable amount of time.  Equipment Recommendations  BSC/3in1    Recommendations for Other Services       Precautions / Restrictions Precautions Precautions: Fall Restrictions Weight Bearing Restrictions: No      Mobility Bed Mobility               General bed mobility comments: not observed, received/left sitting EOB    Transfers Overall transfer level: Needs assistance Equipment used: Rolling walker (2 wheels) Transfers: Sit to/from Stand Sit to Stand: Supervision           General transfer comment: STS from EOB      Balance Overall balance assessment: Needs assistance, History of Falls Sitting-balance support: Feet supported Sitting balance-Leahy Scale: Good     Standing balance support: Bilateral upper extremity supported, During functional activity, No upper extremity supported Standing balance-Leahy Scale: Fair Standing balance comment: use of RW during functional mobility, able to use BUEs to pull up pants in standing, no LOB                           ADL either performed or assessed with clinical judgement   ADL Overall ADL's : Needs assistance/impaired Eating/Feeding: Set up;Sitting                       Toilet Transfer: Rolling walker (2 wheels);Supervision/safety Toilet Transfer Details (indicate cue type and reason): simulated with STS from [employer] and Hygiene:  Sit to/from stand;Supervision/safety       Functional mobility during ADLs: Min guard;Rolling walker (2 wheels) (at room level)       Vision Baseline Vision/History: 1 Wears glasses Patient Visual Report: No change from  baseline Vision Assessment?: No apparent visual deficits     Perception     Praxis      Pertinent Vitals/Pain Pain Assessment Pain Assessment: Faces Faces Pain Scale: Hurts a little bit Pain Location: R hand Pain Descriptors / Indicators: Sore Pain Intervention(s): Monitored during session     Hand Dominance     Extremity/Trunk Assessment Upper Extremity Assessment Upper Extremity Assessment: RUE deficits/detail;LUE deficits/detail RUE Deficits / Details: crush injury from ~2 weeks ago (tire fell on top of hand, wife has been changing dressing), unable to make full composite fist RUE coordination: decreased fine motor LUE Deficits / Details: grip strength fair, AROM grossly WFL, shoulder flexion/elbow flexion/elbow extension strength testing grossly 4/5, thumb opposition & finger to nose intact although decreased speed/accuracy noted with both, pt denied numbness in LUE but reported diminished sensation to light touch LUE Sensation: decreased light touch LUE Coordination: decreased fine motor   Lower Extremity Assessment Lower Extremity Assessment: Generalized weakness   Cervical / Trunk Assessment Cervical / Trunk Assessment: Normal   Communication Communication Communication: No difficulties   Cognition Arousal/Alertness: Awake/alert Behavior During Therapy: WFL for tasks assessed/performed Overall Cognitive Status: Within Functional Limits for tasks assessed                                 General Comments: A&Ox4, able to provide history and follow all commands, good safety awareness     General Comments       Exercises Other Exercises Other Exercises: OT provided education re: role of OT, OT POC, post acute recs, sitting up for all meals, EOB/OOB mobility with assistance, home/fall safety.     Shoulder Instructions      Home Living Family/patient expects to be discharged to:: Private residence Living Arrangements: Spouse/significant  other Available Help at Discharge: Family;Available 24 hours/day Type of Home: House Home Access: Ramped entrance     Home Layout: One level     Bathroom Shower/Tub: Occupational psychologist: Standard     Home Equipment: Conservation officer, nature (2 wheels);Rollator (4 wheels);Cane - single point;Hand held shower head;Grab bars - tub/shower;Shower seat - built in          Prior Functioning/Environment Prior Level of Function : Independent/Modified Independent;Driving;History of Falls (last six months)             Mobility Comments: Independent with no AD for household distances, use of RW PRN for community distances ADLs Comments: Independent for ADLs (although wife has been assisting past couple weeks 2/2 R hand crush injury), shared responsibility for IADLs, still driving        OT Problem List: Decreased strength;Decreased range of motion;Decreased activity tolerance;Impaired balance (sitting and/or standing);Decreased knowledge of use of DME or AE;Decreased coordination;Impaired UE functional use;Pain;Impaired sensation      OT Treatment/Interventions: Self-care/ADL training;Therapeutic exercise;Therapeutic activities;Energy conservation;DME and/or AE instruction;Patient/family education;Balance training    OT Goals(Current goals can be found in the care plan section) Acute Rehab OT Goals Patient Stated Goal: go home OT Goal Formulation: With patient Time For Goal Achievement: 02/09/22 Potential to Achieve Goals: Good   OT Frequency: Min 2X/week    Co-evaluation  AM-PAC OT "6 Clicks" Daily Activity     Outcome Measure Help from another person eating meals?: A Little Help from another person taking care of personal grooming?: A Little Help from another person toileting, which includes using toliet, bedpan, or urinal?: A Little Help from another person bathing (including washing, rinsing, drying)?: A Little Help from another person to put on and  taking off regular upper body clothing?: A Little Help from another person to put on and taking off regular lower body clothing?: A Little 6 Click Score: 18   End of Session Equipment Utilized During Treatment: Rolling walker (2 wheels) Nurse Communication: Mobility status  Activity Tolerance: Patient tolerated treatment well Patient left: in bed;with call bell/phone within reach;with bed alarm set  OT Visit Diagnosis: Unsteadiness on feet (R26.81);Repeated falls (R29.6);Other symptoms and signs involving the nervous system (R29.898);Pain Pain - Right/Left: Right Pain - part of body: Hand                Time: 1410-3013 OT Time Calculation (min): 18 min Charges:  OT General Charges $OT Visit: 1 Visit OT Evaluation $OT Eval Moderate Complexity: Sibley MS, OTR/L ascom [phone]  01/26/22, 6:19 PM

## 2022-01-26 NOTE — H&P (Signed)
History and Physical    Patient: [name] [name]:[id number] DOB: 20-Sep-1933 DOA: 01/26/2022 DOS: the patient was seen and examined on 01/26/2022 PCP: [name], MD  Patient coming from: Home  Chief Complaint:  Chief Complaint  Patient presents with   Code Stroke   HPI: [name] [name] is a 88 y.o. male with medical history significant of atrial fibrillation on Eliquis, complex CAD s/p CABG, HFpEF, type 2 diabetes, CKD stage III, hypertension, who presents to the ED with complaints of left leg weakness and speech changes.  [name] [name] states he was in his normal state of health this morning when he went to eat breakfast.  When walking to his car, he began to notice that his speech was starting to sound different and his right arm and leg are feeling heavy and weak.  When he made it to his car, he noted that his symptoms significantly worsened.  He denies any right-sided symptoms.  He denies any chest pain, shortness of breath, palpitations, loss of consciousness.  [name] [name] states that he had a crush injury approximately 2 weeks ago after a car tire fell on his hand.  Due to the pain, he did take ibuprofen 1 time several days ago.  Otherwise, he denies any NSAID use.  He has been eating and drinking normally.  No urinary changes noted.  ED course: On arrival to the ED, patient's blood pressure was 137/55 with heart rate of 55.  He was saturating at 96% on room air.  Initial workup remarkable for elevated potassium of 5.4, elevated creatinine at 2.69, BUN of 51.  Code stroke was called and neurology evaluated patient.  CT head with no acute abnormality, and CTA with no LVO.  TRH contacted for admission for acute CVA evaluation.  Review of Systems: As mentioned in the history of present illness. All other systems reviewed and are negative.  Past Medical History:  Diagnosis Date   (HFpEF) heart failure with preserved ejection fraction ([place])    a. 10/1189 Echo: nl systolic fxn,  mild LVH, diastolic relaxation abnormality, mildly enlarged LA, mild Ao insufficiency; 01/2018 Echo: EF 55-60%, no rwma, GR2 DD, triv AI, mildly dil LA, mild inc PASP.   AMS (altered mental status) 02/03/2021   BPH (benign prostatic hyperplasia)    Cancer ([place])    skin   Cataract    Choledocholithiasis    a. 05/2020 ERCP w/ CBD stone removal and biliary sphincterotomy.   Coronary artery disease    a. 01/2005 s/p CABG x 4: LIMA-LAD, VG-Diag, VG-OM3, VG-dRCA; b. 06/2013 Cath: patent grafts; c. 09/2015 Cath: VG->Diag 100, other grafts patent. LAD 100, RCA sev dzs->Med rx; d. 01/2018 Cath: LAD 100ost/p, LCX 50, RCA 100p/d, VG->OM3 100, VG->Diag 100, LIMA->LAD ok, VG->RPAV 38m->Med rx.   COVID-19    07/2020 had paxlovid    Dyspnea    with exertion   Dysrhythmia    ED (erectile dysfunction)    Elevated LFTs    a. 05/2020 following CBD stone. 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   GERD (gastroesophageal reflux disease)    Hyperlipidemia    Hypertension    IDDM (insulin dependent diabetes mellitus) 2000   Osteoarthritis    PAH (pulmonary artery hypertension) ([place]    a. 01/2018 [facility]: PA 51/22 (32).   Perirectal fistula    Pneumonia 12/2016   Shingles 09/04/2018   Tubular adenoma of colon 07/2012   Vertigo    Past Surgical  History:  Procedure Laterality Date   BACK SURGERY     CARDIAC CATHETERIZATION  06/05/2013   [facility]    CARDIAC CATHETERIZATION N/A 09/24/2015   Procedure: Right Heart Cath and Coronary/Graft Angiography;  Surgeon: Minna Merritts, MD;  Location: [place] [facility];  Service: Cardiovascular;  Laterality: N/A;   CATARACT EXTRACTION  sept 2013   right   CATARACT EXTRACTION W/PHACO Left 03/28/2017   Procedure: CATARACT EXTRACTION PHACO AND INTRAOCULAR LENS PLACEMENT (IOC);  Surgeon: Birder Robson, MD;  Location: [facility] ORS;  Service: Ophthalmology;  Laterality: Left;  [place] 00:42.0 AP% 15.0 CDE 6.31 Fluid Pack lot #  [id number]   CHOLECYSTECTOMY  05/15/2011   Procedure: LAPAROSCOPIC CHOLECYSTECTOMY;  Surgeon: Rolm Bookbinder, MD;  Location: [place];  Service: General;  Laterality: N/A;   COLONOSCOPY W/ POLYPECTOMY     CORONARY ARTERY BYPASS GRAFT  2007   x 4   CORONARY STENT INTERVENTION N/A 12/08/2020   Procedure: CORONARY STENT INTERVENTION;  Surgeon: Nelva Bush, MD;  Location: [place] [facility];  Service: Cardiovascular;  Laterality: N/A;   ERCP N/A 05/05/2020   Procedure: ENDOSCOPIC RETROGRADE CHOLANGIOPANCREATOGRAPHY (ERCP);  Surgeon: Lucilla Lame, MD;  Location: [facility] ENDOSCOPY;  Service: Endoscopy;  Laterality: N/A;   ERCP N/A 05/12/2020   Procedure: ENDOSCOPIC RETROGRADE CHOLANGIOPANCREATOGRAPHY (ERCP);  Surgeon: Lucilla Lame, MD;  Location[facility] ENDOSCOPY;  Service: Endoscopy;  Laterality: N/A;   ESOPHAGOGASTRODUODENOSCOPY N/A 05/12/2020   Procedure: ESOPHAGOGASTRODUODENOSCOPY (EGD);  Surgeon: Lucilla Lame, MD;  Location: [facility] ENDOSCOPY;  Service: Endoscopy;  Laterality: N/A;   EYE SURGERY     FOOT SURGERY  2012   right foot   JOINT REPLACEMENT  8/10   Right THR--[name]   LEFT AND RIGHT HEART CATHETERIZATION WITH CORONARY/GRAFT ANGIOGRAM N/A 06/05/2013   Procedure: LEFT AND RIGHT HEART CATHETERIZATION WITH Beatrix Fetters;  Surgeon: Peter M Martinique, MD;  Location: [facility] CATH LAB;  Service: Cardiovascular;  Laterality: N/A;   LUMBAR LAMINECTOMY  1989   Prostate photovaporization  5/16   Dr Budd Palmer   RIGHT/LEFT HEART CATH AND CORONARY/GRAFT ANGIOGRAPHY N/A 01/05/2018   Procedure: RIGHT/LEFT HEART CATH AND CORONARY/GRAFT ANGIOGRAPHY;  Surgeon: Minna Merritts, MD;  Location: [place] [facility] LAB;  Service: Cardiovascular;  Laterality: N/A;   RIGHT/LEFT HEART CATH AND CORONARY/GRAFT ANGIOGRAPHY Bilateral 12/08/2020   Procedure: RIGHT/LEFT HEART CATH AND CORONARY/GRAFT ANGIOGRAPHY;  Surgeon: Nelva Bush, MD;  Location: [place] [facility] LAB;  Service: Cardiovascular;  Laterality:  Bilateral;   Social History:  reports that he has never smoked. He has never used smokeless tobacco. He reports current alcohol use. He reports that he does not use drugs.  Allergies  Allergen Reactions   Cortisone Other (See Comments)    Leg Cramps   Metformin Diarrhea    Family History  Problem Relation Age of Onset   Lung cancer Mother    Heart disease Father    Colon cancer Neg Hx    Breast cancer Neg Hx     Prior to Admission medications   Medication Sig Start Date End Date Taking? Authorizing Provider  apixaban (ELIQUIS) 5 MG TABS tablet Take 5 mg by mouth 2 (two) times daily.    [provider]  aspirin EC 81 MG tablet Take 81 mg by mouth every other day. In the morning    [provider]  BD PEN NEEDLE NANO U/F 32G X 4 MM MISC USE AS DIRECTED 08/11/21   [name], MD  carvedilol (COREG) 12.5 MG tablet TAKE ONE (1)  TABLET BY MOUTH TWO TIMES PER DAY 01/03/22   Minna Merritts, MD  cephALEXin (KEFLEX) 500 MG capsule Take 1 capsule (500 mg total) by mouth 3 (three) times daily for 10 days. 01/17/22 01/27/22  Renard Matter, MD  Cholecalciferol (VITAMIN D3) 50 MCG (2000 UT) TABS Take 2,000 Units by mouth in the morning.    [provider]  dapagliflozin propanediol (FARXIGA) 10 MG TABS tablet Take 1 tablet (10 mg total) by mouth daily before breakfast. 06/04/21   [name], MD  doxazosin (CARDURA) 2 MG tablet Take 1 tablet (2 mg total) by mouth 2 (two) times daily. Please keep upcoming appt for future refills 11/18/21   Minna Merritts, MD  doxycycline (VIBRA-TABS) 100 MG tablet Take 1 tablet (100 mg total) by mouth 2 (two) times daily. With food x 10 days 09/02/21   McLean-Scocuzza, Nino Glow, MD  ezetimibe (ZETIA) 10 MG tablet TAKE 1 TABLET BY MOUTH DAILY 11/04/21   Minna Merritts, MD  finasteride (PROSCAR) 5 MG tablet Take 1 tablet (5 mg total) by mouth daily. Patient taking differently: Take 5 mg by mouth every evening. 03/11/20    [name], MD  gabapentin (NEURONTIN) 100 MG capsule TAKE 1 CAPSULE BY MOUTH 3 TIMES DAILY 09/06/21   Dutch Quint B, FNP  hydrALAZINE (APRESOLINE) 25 MG tablet Take 1 tablet (25 mg total) by mouth 3 (three) times daily. 10/15/21   [name], MD  HYDROcodone-acetaminophen (NORCO/VICODIN) 5-325 MG tablet Take 1 tablet by mouth every 6 (six) hours as needed for up to 5 doses for severe pain. 01/17/22   Renard Matter, MD  insulin glargine, 1 Unit Dial, (TOUJEO) 300 UNIT/ML Solostar Pen Inject 40 units under the skin daily 10/15/21     isosorbide mononitrate (IMDUR) 30 MG 24 hr tablet Take 1 tablet (30 mg total) by mouth daily. Please call [phone] for a sooner appointment and for further refills. 12/15/21   Theora Gianotti, NP  lidocaine (LIDODERM) 5 % Place 1 patch onto the skin daily. Remove & Discard patch within 12 hours or as directed by MD 02/12/21   [name], MD  loperamide (IMODIUM A-D) 2 MG tablet Take 1 tablet (2 mg total) by mouth 2 (two) times daily as needed for up to 60 doses for diarrhea or loose stools. Patient taking differently: Take 2 mg by mouth in the morning. 11/13/20   Virgel Manifold, MD  Multiple Vitamins-Minerals (PRESERVISION AREDS 2 PO) Take 1 tablet by mouth in the morning and at bedtime.    [provider]  mupirocin ointment (BACTROBAN) 2 % Apply 1 Application topically 2 (two) times daily. 09/02/21   McLean-Scocuzza, Nino Glow, MD  omeprazole (PRILOSEC) 20 MG capsule TAKE 1 CAPSULE BY MOUTH TWICE DAILY 10/11/21   Dutch Quint B, FNP  potassium chloride (KLOR-CON) 10 MEQ tablet Take 1 tablet (10 mEq total) by mouth daily as needed. Take with torsemide 12/22/20   Minna Merritts, MD  rosuvastatin (CRESTOR) 20 MG tablet Take 1 tablet (20 mg total) by mouth daily. 01/26/21   Minna Merritts, MD  torsemide (DEMADEX) 20 MG tablet FOR WEIGHT 210-215 TAKE 1 TABLET WITH POTASSIUM  FOR WEIGHT GREATER THAN 215 TAKE 1 TABLET TWICE  A DAY (8AM & 2PM) WITH 2 POTASSIUM 12/31/21   [name], MD  TOUJEO SOLOSTAR 300 UNIT/ML Solostar Pen Inject 40 Units into the skin daily. 10/15/21   [name], MD  valsartan (DIOVAN) 320 MG  tablet Take 1 tablet (320 mg total) by mouth daily. At night d/c 160s mg 10/06/21   McLean-Scocuzza, Nino Glow, MD    Physical Exam: Vitals:   01/26/22 1131 01/26/22 1154 01/26/22 1200 01/26/22 1230  BP:   (!) 124/54 (!) 111/53  Pulse:   (!) 50 (!) 51  Resp:   13 18  Temp:  97.8 F (36.6 C)    TempSrc:  Oral    SpO2:   95% 92%  Weight: 100.5 kg     Height: '6\' 1"'$  (1.854 m)      Physical Exam Vitals and nursing note reviewed.  Constitutional:      General: He is not in acute distress.    Appearance: He is normal weight. He is not toxic-appearing.  HENT:     Head: Normocephalic and atraumatic.     Mouth/Throat:     Mouth: Mucous membranes are moist.     Pharynx: Oropharynx is clear.  Eyes:     General: No scleral icterus.    Extraocular Movements: Extraocular movements intact.     Conjunctiva/sclera: Conjunctivae normal.     Pupils: Pupils are equal, round, and reactive to light.  Cardiovascular:     Rate and Rhythm: Bradycardia present. Rhythm irregular.     Heart sounds: No murmur heard.    No gallop.  Pulmonary:     Effort: Pulmonary effort is normal. No respiratory distress.     Breath sounds: Normal breath sounds. No wheezing, rhonchi or rales.  Abdominal:     General: Bowel sounds are normal.     Palpations: Abdomen is soft.  Musculoskeletal:     Right lower leg: No edema.     Left lower leg: No edema.  Skin:    General: Skin is warm and dry.  Neurological:     Mental Status: He is alert and oriented to person, place, and time.     Cranial Nerves: Facial asymmetry present. No dysarthria.     Motor: Weakness present.     Coordination: Finger-Nose-Finger Test normal.     Comments:  Mild left-sided facial droop noted.  No dysarthria. Sensation intact  throughout  Right upper extremity: 4 out of 5 strength Left upper extremity: 3 out of 5 strength Right lower extremity: 5 out of 5 strength Left lower extremity: 3 out of 5 strength  Coordination intact with normal finger-to-nose.  Psychiatric:        Mood and Affect: Mood normal.        Behavior: Behavior normal.        Thought Content: Thought content normal.        Judgment: Judgment normal.    Data Reviewed: CBC with WBC of 6.5, hemoglobin of 12.1 with MCV of 90, platelets of 97.  CMP with potassium of 5.4, bicarb of 21, creatinine of 2.69, BUN of 51, calcium of 8.4, with GFR of 22.  INR elevated 1.6.  PTT within normal limits.  Ethanol levels negative.    EKG personally reviewed.  Atrial fibrillation with slow ventricular rate at 54.  Right bundle branch block.  No acute ST or T wave changes concerning for acute ischemia.  Compared to prior EKG on 02/03/2021, no changes noted.  CT ANGIO HEAD NECK W WO CM (CODE STROKE)  Result Date: 01/26/2022 CLINICAL DATA:  Neuro deficit, acute, stroke suspected. Acute left arm and leg numbness and dysarthria. EXAM: CT ANGIOGRAPHY HEAD AND NECK TECHNIQUE: Multidetector CT imaging of the head and neck was performed using the  standard protocol during bolus administration of intravenous contrast. Multiplanar CT image reconstructions and MIPs were obtained to evaluate the vascular anatomy. Carotid stenosis measurements (when applicable) are obtained utilizing NASCET criteria, using the distal internal carotid diameter as the denominator. RADIATION DOSE REDUCTION: This exam was performed according to the departmental dose-optimization program which includes automated exposure control, adjustment of the mA and/or kV according to patient size and/or use of iterative reconstruction technique. CONTRAST:  33m OMNIPAQUE IOHEXOL 350 MG/ML SOLN COMPARISON:  None Available. FINDINGS: CTA NECK FINDINGS Aortic arch: Standard 3 vessel aortic arch with mild  atherosclerotic plaque. Mixed calcified and soft plaque in the subclavian arteries without a flow limiting stenosis. Right carotid system: Patent with a small amount of calcified plaque at the carotid bifurcation and minimal calcified plaque in the distal cervical ICA. No evidence of a significant stenosis or dissection. Left carotid system: Patent with a small amount of scattered, predominantly calcified plaque in the common carotid artery, at the carotid bifurcation, and in the mid to distal cervical ICA. No evidence of a significant stenosis or dissection. Vertebral arteries: Patent with the left being dominant. Predominantly calcified plaque at the vertebral artery origins results in moderate to severe stenosis on the left and at most mild stenosis on the right. Skeleton: Advanced disc degeneration in the cervical and upper thoracic spine. Interbody ankylosis at C3-4, C4-5, C7-T1, T1-2, and T3-4. Other neck: No evidence of cervical lymphadenopathy or mass. Upper chest: Clear lung apices. Review of the MIP images confirms the above findings CTA HEAD FINDINGS Anterior circulation: The internal carotid arteries are patent from skull base to carotid termini with calcified plaque resulting in mild cavernous and paraclinoid stenoses bilaterally. ACAs and MCAs are patent without evidence of a proximal branch occlusion or flow limiting A1 or M1 stenosis. There is an early bifurcation of the right MCA. A severe mid right P2 branch stenosis is noted. No aneurysm is identified. Posterior circulation: The intracranial vertebral arteries are patent with the right being diminutive distal to the PICA origin. There are severe right and moderate to severe left V4 stenoses due to heavily calcified plaque. Patent PICA and SCA origins are seen bilaterally. The basilar artery is patent with mild atherosclerotic irregularity but no significant stenosis. Posterior communicating arteries are diminutive or absent. The PCAs are patent  with widespread advanced atherosclerotic irregularity including severe mid distal right P2 and mid left P2 stenoses. No aneurysm is identified. Venous sinuses: As permitted by contrast timing, patent. Anatomic variants: None of significance. Review of the MIP images confirms the above findings Preliminary finding of no LVO was communicated to Dr. SQuinn Axeat 11:27 am on 01/26/2022 by text page via the [facility] system. IMPRESSION: 1. No large vessel occlusion. 2. Advanced intracranial atherosclerosis including severe bilateral P2, V4, and right M2 stenoses. 3. Moderate to severe left vertebral artery origin stenosis. 4. Mild cervical carotid artery atherosclerosis without significant stenosis. 5.  Aortic Atherosclerosis (ICD10-I70.0). Electronically Signed   By: ALogan BoresM.D.   On: 01/26/2022 11:41   CT HEAD CODE STROKE WO CONTRAST  Result Date: 01/26/2022 CLINICAL DATA:  Code stroke.  Neuro deficit, acute, stroke suspected EXAM: CT HEAD WITHOUT CONTRAST TECHNIQUE: Contiguous axial images were obtained from the base of the skull through the vertex without intravenous contrast. RADIATION DOSE REDUCTION: This exam was performed according to the departmental dose-optimization program which includes automated exposure control, adjustment of the mA and/or kV according to patient size and/or use of iterative reconstruction technique. COMPARISON:  CT head February 03, 2021. FINDINGS: Brain: No evidence of acute large vascular territory infarction, hemorrhage, hydrocephalus, extra-axial collection or mass lesion/mass effect. Vascular: No hyperdense vessel identified. Calcific atherosclerosis. Skull: No acute fracture. Sinuses/Orbits: Paranasal sinus mucosal thickening with near complete left sphenoid sinus opacification. No acute orbital findings. Other: No mastoid effusions. ASPECTS [facility] Stroke Program Early CT Score) total score (0-10 with 10 being normal): 10. IMPRESSION: 1. No evidence of acute  intracranial abnormality. 2. ASPECTS is 10. Code stroke imaging results were communicated on 01/26/2022 at 11:18 am to provider [facility] via secure text paging. Electronically Signed   By: Margaretha Sheffield M.D.   On: 01/26/2022 11:18    Results are pending, will review when available.  Assessment and Plan: * Acute CVA (cerebrovascular accident) [facility]) Patient presenting with sudden onset weakness and altered speech with negative CT head and CTA with no LVO.  Neurology has been consulted.  Patient has a history of atrial fibrillation and taking Eliquis with no missed doses.  - Neurology following; appreciate their recommendations - MRI brain pending - TTE ordered - PT/OT/SLP - Holding home Eliquis until MRI results - Continue home aspirin - Permissive hypertension - A1c and lipid panel pending  Acute kidney injury superimposed on chronic kidney disease ([place]) Patient has a history of CKD with creatinine ranging between 1.5 and 1.7.  Today, creatinine is elevated at 2.69.  He did take one-time dose of NSAID several days ago and was recently started on Keflex due to hand injury but otherwise denies any medication changes.  He denies any changes in his p.o. intake.  - Gentle IV fluids given contrasted study today - Renal ultrasound - Monitor urine output - Repeat BMP in the a.m.  Atrial fibrillation, chronic ([place]) Patient has a history of atrial fibrillation that is persistent with occasional typical atrial flutter.  He is anticoagulated with Eliquis and on carvedilol and amiodarone.  Amiodarone was started in October 2023.  Today, EKG and telemetry demonstrates slow ventricular response.  - Holding home carvedilol in the setting of permissive hypertension - Holding home Eliquis until MRI results - Continue amiodarone - Check TSH  Essential hypertension - Hold home antihypertensives to allow for permissive hypertension  Chronic diastolic CHF (congestive heart failure) ([place]) Patient is  euvolemic on examination.    - Given AKI, will hold diuretics and GDMT.  CAD (coronary artery disease) Patient has a complicated history of CAD s/p CABG with PCI in 3818 complicated by unsuccessful PCI to SVG to PDA with subsequent occluded flow.  - Continue home aspirin, rosuvastatin and Zetia - Holding home Eliquis  Type 2 diabetes mellitus with complication, without long-term current use of insulin (HCC) - Hold home Toujeo (40 units) and Farxiga - SSI, moderate - Semglee 20 units at bedtime - A1c pending  Thrombocytopenia ([place]) Per chart review, longstanding history of mild to moderate thrombocytopenia of unknown etiology dating back at least 10 years.  Stable at this time.  Hand crush injury, right, sequela - Counseled to avoid NSAIDs - Norco as needed for pain control - Continue wound care - Continue outpatient follow-up with orthopedic surgery  Advance Care Planning:   Code Status: Full Code   Consults: Neurology  Family Communication: No family at bedside  Severity of Illness: The appropriate patient status for this patient is OBSERVATION. Observation status is judged to be reasonable and necessary in order to provide the required intensity of service to ensure the patient's safety. The patient's presenting symptoms, physical exam findings,  and initial radiographic and laboratory data in the context of their medical condition is felt to place them at decreased risk for further clinical deterioration. Furthermore, it is anticipated that the patient will be medically stable for discharge from the hospital within 2 midnights of admission.   Author: Jose Persia, MD 01/26/2022 1:37 PM  For on call review [email or web address].

## 2022-01-26 NOTE — ED Notes (Signed)
Pt to MRI

## 2022-01-26 NOTE — ED Notes (Signed)
Admitting provider at bedside.

## 2022-01-26 NOTE — Consult Note (Addendum)
NEUROLOGY CONSULTATION NOTE   Date of service: January 26, 2022 Patient Name: [name] [name] MRN:  [id number] DOB:  Aug 14, 1933 Reason for consult: code stroke L side weak and numb Requesting physician: Dr. Delman Kitten _ _ _   _ __   _ __ _ _  __ __   _ __   __ _  History of Present Illness   This is a 88UY with hx diastolic HF, CAD, HL, HTN, DM2, a fib on eliquis who presents with L sided weakness and numbness of face/arm/leg and dysarthria. LKW 0930. Last dose of eliquis was this AM. NIHSS = 6. CTH NAICP. TNK not administered 2/2 already on therapeutic anticoagulation. CTA showed no LVO therefore no intervention was indicated. At baseline has no deficits.   All CNS imaging personally reviewed   ROS   Per HPI: all other systems reviewed and are negative  Past History   I have reviewed the following:  Past Medical History:  Diagnosis Date   (HFpEF) heart failure with preserved ejection fraction ([name])    a. 06/345 Echo: nl systolic fxn, mild LVH, diastolic relaxation abnormality, mildly enlarged LA, mild Ao insufficiency; 01/2018 Echo: EF 55-60%, no rwma, GR2 DD, triv AI, mildly dil LA, mild inc PASP.   AMS (altered mental status) 02/03/2021   BPH (benign prostatic hyperplasia)    Cancer ([name] [name])    skin   Cataract    Choledocholithiasis    a. 05/2020 ERCP w/ CBD stone removal and biliary sphincterotomy.   Coronary artery disease    a. 01/2005 s/p CABG x 4: LIMA-LAD, VG-Diag, VG-OM3, VG-dRCA; b. 06/2013 Cath: patent grafts; c. 09/2015 Cath: VG->Diag 100, other grafts patent. LAD 100, RCA sev dzs->Med rx; d. 01/2018 Cath: LAD 100ost/p, LCX 50, RCA 100p/d, VG->OM3 100, VG->Diag 100, LIMA->LAD ok, VG->RPAV 64m->Med rx.   COVID-19    07/2020 had paxlovid    Dyspnea    with exertion   Dysrhythmia    ED (erectile dysfunction)    Elevated LFTs    a. 05/2020 following CBD stone. 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   GERD (gastroesophageal  reflux disease)    Hyperlipidemia    Hypertension    IDDM (insulin dependent diabetes mellitus) 2000   Osteoarthritis    PAH (pulmonary artery hypertension) ([place]    a. 01/2018 RHC: PA 51/22 (32).   Perirectal fistula    Pneumonia 12/2016   Shingles 09/04/2018   Tubular adenoma of colon 07/2012   Vertigo    Past Surgical History:  Procedure Laterality Date   BACK SURGERY     CARDIAC CATHETERIZATION  06/05/2013   [facility]    CARDIAC CATHETERIZATION N/A 09/24/2015   Procedure: Right Heart Cath and Coronary/Graft Angiography;  Surgeon: TMinna Merritts MD;  [facility];  Service: Cardiovascular;  Laterality: N/A;   CATARACT EXTRACTION  sept 2013   right   CATARACT EXTRACTION W/PHACO Left 03/28/2017   Procedure: CATARACT EXTRACTION PHACO AND INTRAOCULAR LENS PLACEMENT (IOC);  Surgeon: PBirder Robson MD;  Location: [facility];  Service: Ophthalmology;  Laterality: Left;  [place]00:42.0 AP% 15.0 CDE 6.31 Fluid Pack lot # [id number]   CHOLECYSTECTOMY  05/15/2011   Procedure: LAPAROSCOPIC CHOLECYSTECTOMY;  Surgeon: MRolm Bookbinder MD;  [place] [facility]  Service: General;  Laterality: N/A;   COLONOSCOPY W/ POLYPECTOMY     CORONARY ARTERY BYPASS GRAFT  2007   x 4   CORONARY STENT INTERVENTION N/A 12/08/2020  Procedure: CORONARY STENT INTERVENTION;  Surgeon: Nelva Bush, MD;  Location: [place] [facility];  Service: Cardiovascular;  Laterality: N/A;   ERCP N/A 05/05/2020   Procedure: ENDOSCOPIC RETROGRADE CHOLANGIOPANCREATOGRAPHY (ERCP);  Surgeon: Lucilla Lame, MD;  Location: [facility] ENDOSCOPY;  Service: Endoscopy;  Laterality: N/A;   ERCP N/A 05/12/2020   Procedure: ENDOSCOPIC RETROGRADE CHOLANGIOPANCREATOGRAPHY (ERCP);  Surgeon: Lucilla Lame, MD;  Location: [facility] ENDOSCOPY;  Service: Endoscopy;  Laterality: N/A;   ESOPHAGOGASTRODUODENOSCOPY N/A 05/12/2020   Procedure: ESOPHAGOGASTRODUODENOSCOPY (EGD);  Surgeon: Lucilla Lame, MD;  Location: [facility] ENDOSCOPY;  Service: Endoscopy;   Laterality: N/A;   EYE SURGERY     FOOT SURGERY  2012   right foot   JOINT REPLACEMENT  8/10   Right THR--[name]   LEFT AND RIGHT HEART CATHETERIZATION WITH CORONARY/GRAFT ANGIOGRAM N/A 06/05/2013   Procedure: LEFT AND RIGHT HEART CATHETERIZATION WITH Beatrix Fetters;  Surgeon: Peter M Martinique, MD;  Location: [facility] CATH LAB;  Service: Cardiovascular;  Laterality: N/A;   LUMBAR LAMINECTOMY  1989   Prostate photovaporization  5/16   Dr Budd Palmer   RIGHT/LEFT HEART CATH AND CORONARY/GRAFT ANGIOGRAPHY N/A 01/05/2018   Procedure: RIGHT/LEFT HEART CATH AND CORONARY/GRAFT ANGIOGRAPHY;  Surgeon: Minna Merritts, MD;  Location: [place] [facility];  Service: Cardiovascular;  Laterality: N/A;   RIGHT/LEFT HEART CATH AND CORONARY/GRAFT ANGIOGRAPHY Bilateral 12/08/2020   Procedure: RIGHT/LEFT HEART CATH AND CORONARY/GRAFT ANGIOGRAPHY;  Surgeon: Nelva Bush, MD;  Location: [place] [facility];  Service: Cardiovascular;  Laterality: Bilateral;   Family History  Problem Relation Age of Onset   Lung cancer Mother    Heart disease Father    Colon cancer Neg Hx    Breast cancer Neg Hx    Social History   Socioeconomic History   Marital status: Married    Spouse name: Jan   Number of children: 4   Years of education: Not on file   Highest education level: Not on file  Occupational History   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  Tobacco Use   Smoking status: Never   Smokeless tobacco: Never  Vaping Use   Vaping Use: Never used  Substance and Sexual Activity   Alcohol use: Yes    Comment: occ cocktail   Drug use: No   Sexual activity: Not Currently  Other Topics Concern   Not on file  Social History Narrative   Goes by [name]   Has living will    Has designated [employer] as health care POA   Would accept resuscitation attempts   Would not want tube feeds if cognitively unaware   Social Determinants of Health   Financial  Resource Strain: Low Risk  (07/07/2021)   Overall Financial Resource Strain (CARDIA)    Difficulty of Paying Living Expenses: Not hard at all  Food Insecurity: No Food Insecurity (07/07/2021)   Hunger Vital Sign    Worried About Running Out of Food in the Last Year: Never true    [place] in the Last Year: Never true  Transportation Needs: No Transportation Needs (07/07/2021)   PRAPARE - Hydrologist (Medical): No    Lack of Transportation (Non-Medical): No  Physical Activity: Sufficiently Active (07/03/2020)   Exercise Vital Sign    Days of Exercise per Week: 4 days    Minutes of Exercise per Session: 40 min  Stress: No Stress Concern Present (07/07/2021)   [place]  Feeling of Stress : Not at all  Social Connections: Unknown (07/07/2021)   Social Connection and Isolation Panel [NHANES]    Frequency of Communication with Friends and Family: Not on file    Frequency of Social Gatherings with Friends and Family: Not on file    Attends Religious Services: Not on file    Active Member of Clubs or Organizations: Not on file    Attends Archivist Meetings: Not on file    Marital Status: Married   Allergies  Allergen Reactions   Cortisone Other (See Comments)    Leg Cramps   Metformin Diarrhea    Medications   (Not in a hospital admission)     Current Facility-Administered Medications:    sodium chloride flush (NS) 0.9 % injection 3 mL, 3 mL, Intravenous, Once, Delman Kitten, MD  Current Outpatient Medications:    apixaban (ELIQUIS) 5 MG TABS tablet, Take 5 mg by mouth 2 (two) times daily., Disp: , Rfl:    aspirin EC 81 MG tablet, Take 81 mg by mouth every other day. In the morning, Disp: , Rfl:    BD PEN NEEDLE NANO U/F 32G X 4 MM MISC, USE AS DIRECTED, Disp: 100 each, Rfl: 0   carvedilol (COREG) 12.5 MG tablet, TAKE ONE (1) TABLET BY MOUTH TWO TIMES PER DAY, Disp: 60  tablet, Rfl: 0   cephALEXin (KEFLEX) 500 MG capsule, Take 1 capsule (500 mg total) by mouth 3 (three) times daily for 10 days., Disp: 30 capsule, Rfl: 0   Cholecalciferol (VITAMIN D3) 50 MCG (2000 UT) TABS, Take 2,000 Units by mouth in the morning., Disp: , Rfl:    dapagliflozin propanediol (FARXIGA) 10 MG TABS tablet, Take 1 tablet (10 mg total) by mouth daily before breakfast., Disp: 90 tablet, Rfl: 1   doxazosin (CARDURA) 2 MG tablet, Take 1 tablet (2 mg total) by mouth 2 (two) times daily. Please keep upcoming appt for future refills, Disp: 180 tablet, Rfl: 0   doxycycline (VIBRA-TABS) 100 MG tablet, Take 1 tablet (100 mg total) by mouth 2 (two) times daily. With food x 10 days, Disp: 20 tablet, Rfl: 0   ezetimibe (ZETIA) 10 MG tablet, TAKE 1 TABLET BY MOUTH DAILY, Disp: 90 tablet, Rfl: 0   finasteride (PROSCAR) 5 MG tablet, Take 1 tablet (5 mg total) by mouth daily. (Patient taking differently: Take 5 mg by mouth every evening.), Disp: 90 tablet, Rfl: 1   gabapentin (NEURONTIN) 100 MG capsule, TAKE 1 CAPSULE BY MOUTH 3 TIMES DAILY, Disp: 90 capsule, Rfl: 1   hydrALAZINE (APRESOLINE) 25 MG tablet, Take 1 tablet (25 mg total) by mouth 3 (three) times daily., Disp: 270 tablet, Rfl: 3   HYDROcodone-acetaminophen (NORCO/VICODIN) 5-325 MG tablet, Take 1 tablet by mouth every 6 (six) hours as needed for up to 5 doses for severe pain., Disp: 5 tablet, Rfl: 0   insulin glargine, 1 Unit Dial, (TOUJEO) 300 UNIT/ML Solostar Pen, Inject 40 units under the skin daily, Disp: 13.5 mL, Rfl: 3   isosorbide mononitrate (IMDUR) 30 MG 24 hr tablet, Take 1 tablet (30 mg total) by mouth daily. Please call [phone] for a sooner appointment and for further refills., Disp: 30 tablet, Rfl: 0   lidocaine (LIDODERM) 5 %, Place 1 patch onto the skin daily. Remove & Discard patch within 12 hours or as directed by MD, Disp: 30 patch, Rfl: 0   loperamide (IMODIUM A-D) 2 MG tablet, Take 1 tablet (2 mg total) by mouth 2 (two)  times daily as needed for up to 60 doses for diarrhea or loose stools. (Patient taking differently: Take 2 mg by mouth in the morning.), Disp: 60 tablet, Rfl: 0   Multiple Vitamins-Minerals (PRESERVISION AREDS 2 PO), Take 1 tablet by mouth in the morning and at bedtime., Disp: , Rfl:    mupirocin ointment (BACTROBAN) 2 %, Apply 1 Application topically 2 (two) times daily., Disp: 30 g, Rfl: 0   omeprazole (PRILOSEC) 20 MG capsule, TAKE 1 CAPSULE BY MOUTH TWICE DAILY, Disp: 60 capsule, Rfl: 1   potassium chloride (KLOR-CON) 10 MEQ tablet, Take 1 tablet (10 mEq total) by mouth daily as needed. Take with torsemide, Disp: 90 tablet, Rfl: 3   rosuvastatin (CRESTOR) 20 MG tablet, Take 1 tablet (20 mg total) by mouth daily., Disp: 90 tablet, Rfl: 3   torsemide (DEMADEX) 20 MG tablet, FOR WEIGHT 210-215 TAKE 1 TABLET WITH POTASSIUM  FOR WEIGHT GREATER THAN 215 TAKE 1 TABLET TWICE A DAY (8AM & 2PM) WITH 2 POTASSIUM, Disp: 200 tablet, Rfl: 0   TOUJEO SOLOSTAR 300 UNIT/ML Solostar Pen, Inject 40 Units into the skin daily., Disp: 22.5 mL, Rfl: 6   valsartan (DIOVAN) 320 MG tablet, Take 1 tablet (320 mg total) by mouth daily. At night d/c 160s mg, Disp: 90 tablet, Rfl: 3  Vitals   Vitals:   01/26/22 1101  BP: (!) 116/49  Pulse: (!) 54  Resp: 16  SpO2: 96%     There is no height or weight on file to calculate BMI.  Physical Exam   Physical Exam Gen: A&O x4, NAD HEENT: Atraumatic, normocephalic;mucous membranes moist; oropharynx clear, tongue without atrophy or fasciculations. Neck: Supple, trachea midline. Resp: CTAB, no w/r/r CV: RRR, no m/g/r; nml S1 and S2. 2+ symmetric peripheral pulses. Abd: soft/NT/ND; nabs x 4 quad Extrem: Nml bulk; no cyanosis, clubbing, or edema.  Neuro: *MS: A&O x4. Follows multi-step commands.  *Speech: fluid, mild dysarthria, able to name and repeat *CN:    I: Deferred   II,III: PERRLA, VFF by confrontation, optic discs unable to be visualized 2/2 pupillary  constriction   III,IV,VI: EOMI w/o nystagmus, no ptosis   V: Sensation impaired from V1 to V3 on L   VII: Eyelid closure was full.  L UMN facial droop   VIII: Hearing intact to voice   IX,X: Voice normal, palate elevates symmetrically    XI: SCM/trap 5/5 bilat   XII: Tongue protrudes midline, no atrophy or fasciculations  *Motor:   Normal bulk.  No tremor, rigidity or bradykinesia. Drift but not to bed LUE and LLE. RUE and RLE full strength. Decreased grip strength L compared to R *Sensory: deficit to LT LUE and LLE. No extinction to DSS *Coordination:  FNF intact *Reflexes:  1+ and symmetric throughout without clonus; toes down-going bilat *Gait: unsteady with L limp  NIHSS  1a Level of Conscious.: 0 1b LOC Questions: 0 1c LOC Commands: 0 2 Best Gaze: 0 3 Visual: 0 4 Facial Palsy: 2 5a Motor Arm - left: 1 5b Motor Arm - Right: 0 6a Motor Leg - Left: 1 6b Motor Leg - Right: 0 7 Limb Ataxia: 0 8 Sensory: 1 9 Best Language: 0 10 Dysarthria: 1 11 Extinct. and Inatten.: 0  TOTAL: 6   Premorbid mRS = 0   Labs   CBC: No results for input(s): "WBC", "NEUTROABS", "HGB", "HCT", "MCV", "PLT" in the last 168 hours.  Basic Metabolic Panel:  Lab Results  Component Value Date   NA  138 09/10/2021   K 4.1 09/10/2021   CO2 25 09/10/2021   GLUCOSE 200 (H) 09/10/2021   BUN 23 09/10/2021   CREATININE 1.64 (H) 09/10/2021   CALCIUM 8.9 09/10/2021   GFRNONAA 46 (L) 02/03/2021   GFRAA 53 (L) 04/27/2020   Lipid Panel:  Lab Results  Component Value Date   LDLCALC 18 04/28/2021   HgbA1c:  Lab Results  Component Value Date   HGBA1C 7.9 (H) 09/10/2021   Urine Drug Screen: No results found for: "LABOPIA", "COCAINSCRNUR", "LABBENZ", "AMPHETMU", "THCU", "LABBARB"  Alcohol Level No results found for: "ETH"   Impression   This is a 88 yo with hx diastolic HF, CAD, HL, HTN, DM2, a fib on eliquis who presents with L sided weakness and numbness of face/arm/leg and dysarthria c/f  acute ischemic stroke. Not a candidate for TNK 2/2 being on eliquis. CTA showed no LVO therefore no intervention was indicated.  Recommendations   - Admit for stroke workup - Permissive HTN x48 hrs from sx onset or until stroke ruled out by MRI goal BP <220/110. PRN labetalol or hydralazine if BP above these parameters. Avoid oral antihypertensives. - MRI brain wo contrast - TTE - Check A1c and LDL + add statin per guidelines - Hold eliquis until MRI results are reviewed by neurology - q4 hr neuro checks - STAT head CT for any change in neuro exam - Tele - PT/OT/SLP - Stroke education - Amb referral to neurology upon discharge - Will continue to follow  Addendum 01/26/22 @ 1924: Pt does have a small acute infarct R caudate head on MRI. Hold eliquis x3 days then repeat head CT. If no hemorrhagic conversion can restart anticoagulation at that time. For the interim will bridge with ASA '81mg'$  daily.  ______________________________________________________________________   Thank you for the opportunity to take part in the care of this patient. If you have any further questions, please contact the neurology consultation attending.  Signed,  Su Monks, MD [facility] [phone]  If 7pm- 7am, please page neurology on call as listed in [facility].  **Any copied and pasted documentation in this note was written by me in another application not billed for and pasted by me into this document.

## 2022-01-26 NOTE — ED Notes (Signed)
Informed RN bed assigned 

## 2022-01-26 NOTE — Assessment & Plan Note (Signed)
Patient has a history of CKD with creatinine ranging between 1.5 and 1.7.  Today, creatinine is elevated at 2.69.  He did take one-time dose of NSAID several days ago and was recently started on Keflex due to hand injury but otherwise denies any medication changes.  He denies any changes in his p.o. intake.  - Gentle IV fluids given contrasted study today - Renal ultrasound - Monitor urine output - Repeat BMP in the a.m.

## 2022-01-27 DIAGNOSIS — E875 Hyperkalemia: Secondary | ICD-10-CM | POA: Diagnosis present

## 2022-01-27 DIAGNOSIS — I5032 Chronic diastolic (congestive) heart failure: Secondary | ICD-10-CM | POA: Diagnosis present

## 2022-01-27 DIAGNOSIS — Z7901 Long term (current) use of anticoagulants: Secondary | ICD-10-CM | POA: Diagnosis not present

## 2022-01-27 DIAGNOSIS — W230XXA Caught, crushed, jammed, or pinched between moving objects, initial encounter: Secondary | ICD-10-CM | POA: Diagnosis present

## 2022-01-27 DIAGNOSIS — Z8249 Family history of ischemic heart disease and other diseases of the circulatory system: Secondary | ICD-10-CM | POA: Diagnosis not present

## 2022-01-27 DIAGNOSIS — I639 Cerebral infarction, unspecified: Secondary | ICD-10-CM | POA: Diagnosis present

## 2022-01-27 DIAGNOSIS — J323 Chronic sphenoidal sinusitis: Secondary | ICD-10-CM | POA: Diagnosis not present

## 2022-01-27 DIAGNOSIS — Z951 Presence of aortocoronary bypass graft: Secondary | ICD-10-CM | POA: Diagnosis not present

## 2022-01-27 DIAGNOSIS — I4821 Permanent atrial fibrillation: Secondary | ICD-10-CM | POA: Diagnosis present

## 2022-01-27 DIAGNOSIS — I130 Hypertensive heart and chronic kidney disease with heart failure and stage 1 through stage 4 chronic kidney disease, or unspecified chronic kidney disease: Secondary | ICD-10-CM | POA: Diagnosis present

## 2022-01-27 DIAGNOSIS — E1165 Type 2 diabetes mellitus with hyperglycemia: Secondary | ICD-10-CM | POA: Diagnosis present

## 2022-01-27 DIAGNOSIS — Z8616 Personal history of COVID-19: Secondary | ICD-10-CM | POA: Diagnosis not present

## 2022-01-27 DIAGNOSIS — N1832 Chronic kidney disease, stage 3b: Secondary | ICD-10-CM | POA: Diagnosis present

## 2022-01-27 DIAGNOSIS — R29818 Other symptoms and signs involving the nervous system: Secondary | ICD-10-CM | POA: Diagnosis not present

## 2022-01-27 DIAGNOSIS — I2510 Atherosclerotic heart disease of native coronary artery without angina pectoris: Secondary | ICD-10-CM | POA: Diagnosis present

## 2022-01-27 DIAGNOSIS — D631 Anemia in chronic kidney disease: Secondary | ICD-10-CM | POA: Diagnosis present

## 2022-01-27 DIAGNOSIS — N179 Acute kidney failure, unspecified: Secondary | ICD-10-CM | POA: Diagnosis present

## 2022-01-27 DIAGNOSIS — K219 Gastro-esophageal reflux disease without esophagitis: Secondary | ICD-10-CM | POA: Diagnosis present

## 2022-01-27 DIAGNOSIS — R531 Weakness: Secondary | ICD-10-CM | POA: Diagnosis not present

## 2022-01-27 DIAGNOSIS — G8194 Hemiplegia, unspecified affecting left nondominant side: Secondary | ICD-10-CM | POA: Diagnosis present

## 2022-01-27 DIAGNOSIS — I6389 Other cerebral infarction: Secondary | ICD-10-CM | POA: Diagnosis present

## 2022-01-27 DIAGNOSIS — R29810 Facial weakness: Secondary | ICD-10-CM | POA: Diagnosis present

## 2022-01-27 DIAGNOSIS — E1122 Type 2 diabetes mellitus with diabetic chronic kidney disease: Secondary | ICD-10-CM | POA: Diagnosis present

## 2022-01-27 DIAGNOSIS — R29706 NIHSS score 6: Secondary | ICD-10-CM | POA: Diagnosis present

## 2022-01-27 DIAGNOSIS — N400 Enlarged prostate without lower urinary tract symptoms: Secondary | ICD-10-CM | POA: Diagnosis present

## 2022-01-27 DIAGNOSIS — R4781 Slurred speech: Secondary | ICD-10-CM | POA: Diagnosis present

## 2022-01-27 DIAGNOSIS — D696 Thrombocytopenia, unspecified: Secondary | ICD-10-CM | POA: Diagnosis present

## 2022-01-27 DIAGNOSIS — Z794 Long term (current) use of insulin: Secondary | ICD-10-CM | POA: Diagnosis not present

## 2022-01-27 DIAGNOSIS — I672 Cerebral atherosclerosis: Secondary | ICD-10-CM | POA: Diagnosis not present

## 2022-01-27 DIAGNOSIS — E7800 Pure hypercholesterolemia, unspecified: Secondary | ICD-10-CM | POA: Diagnosis present

## 2022-01-27 LAB — BASIC METABOLIC PANEL
Anion gap: 3 — ABNORMAL LOW (ref 5–15)
BUN: 44 mg/dL — ABNORMAL HIGH (ref 8–23)
CO2: 22 mmol/L (ref 22–32)
Calcium: 8.3 mg/dL — ABNORMAL LOW (ref 8.9–10.3)
Chloride: 116 mmol/L — ABNORMAL HIGH (ref 98–111)
Creatinine, Ser: 2.38 mg/dL — ABNORMAL HIGH (ref 0.61–1.24)
GFR, Estimated: 26 mL/min — ABNORMAL LOW (ref >60)
Glucose, Bld: 112 mg/dL — ABNORMAL HIGH (ref 70–99)
Potassium: 4.4 mmol/L (ref 3.5–5.1)
Sodium: 141 mmol/L (ref 135–145)

## 2022-01-27 LAB — CBC
HCT: 32.5 % — ABNORMAL LOW (ref 39.0–52.0)
Hemoglobin: 11.2 g/dL — ABNORMAL LOW (ref 13.0–17.0)
MCH: 31.3 pg (ref 26.0–34.0)
MCHC: 34.5 g/dL (ref 30.0–36.0)
MCV: 90.8 fL (ref 80.0–100.0)
Platelets: 100 10*3/uL — ABNORMAL LOW (ref 150–400)
RBC: 3.58 MIL/uL — ABNORMAL LOW (ref 4.22–5.81)
RDW: 12.8 % (ref 11.5–15.5)
WBC: 7.2 10*3/uL (ref 4.0–10.5)
nRBC: 0 % (ref 0.0–0.2)

## 2022-01-27 LAB — GLUCOSE, CAPILLARY
Glucose-Capillary: 88 mg/dL (ref 70–99)
Glucose-Capillary: 313 mg/dL — ABNORMAL HIGH (ref 70–99)
Glucose-Capillary: 238 mg/dL — ABNORMAL HIGH (ref 70–99)
Glucose-Capillary: 179 mg/dL — ABNORMAL HIGH (ref 70–99)

## 2022-01-27 MED ORDER — SODIUM CHLORIDE 0.9 % IV SOLN: INTRAVENOUS | Status: DC

## 2022-01-27 MED ORDER — CEPHALEXIN 500 MG PO CAPS: 500.0000 mg | ORAL_CAPSULE | Freq: Three times a day (TID) | ORAL | Status: DC

## 2022-01-27 MED ORDER — CEPHALEXIN 500 MG PO CAPS
500.0000 mg | ORAL_CAPSULE | Freq: Three times a day (TID) | ORAL | Status: DC
  Administered 2022-01-27 – 2022-01-31 (×12): 500 mg via ORAL
  Filled 2022-01-27 (×12): qty 1

## 2022-01-27 NOTE — Progress Notes (Signed)
PROGRESS NOTE  [name] [name]  MVE:[id number] DOB: 1933-12-31 DOA: 01/26/2022 PCP: [name], MD   Brief Narrative:  Patient is a 88 year old male with history of permanent A-fib, coronary artery disease status post CABG, heart failure with preserved ejection fraction, type 2 diabetes, CKD stage IIIb, hypertension who presented with complaint of left leg weakness, slurred speech from home.  On presentation ,he was hemodynamically stable.  Lab work showed creatinine of 2.6.   Code Stroke was called.  CT head without any acute findings.  MRI of the brain showed acute infarct on right caudate head.  Neurology consulted.  Started stroke work-up.  Assessment & Plan:  Principal Problem:   Acute CVA (cerebrovascular accident) [facility]) Active Problems:   Acute kidney injury superimposed on chronic kidney disease (HCC)   Atrial fibrillation, chronic (HCC)   Essential hypertension   Chronic diastolic CHF (congestive heart failure) (HCC)   CAD (coronary artery disease)   Type 2 diabetes mellitus with complication, without long-term current use of insulin (HCC)   Thrombocytopenia (HCC)   Hand crush injury, right, sequela   Stroke ([place])   Acute CVA: Presented with sudden onset of left-sided weakness, slurred speech.  MRI showed acute infarct of the right caudate head.  CT angiogram did not show any LVO  but showed bilateral P2 , V4, right M2 stenosis.  Neurology following. Echo report pending. LDL of 33.  A1c pending. PT/OT recommending health on discharge. Neurology recommended to  hold Eliquis for 3 days, do a repeat CT, if negative for hemorrhage, can restart.  In the meantime he has been started on aspirin. He does not have any focal neurological deficits today.  Speech is clear.  He is completely alert and oriented. He is already on Zetia, Crestor from home.  CKD stage IIIb: Baseline creatinine from 1.5-1.7.  Presented with creatinine of 2.6.  Slowly improving with IV fluids.  Check  BMP tomorrow.    Chronic A-fib: Remains in A-fib with controlled rate.  On Eliquis for anticoagulation which is on hold.  Also on carvedilol, amiodarone at home.  Amiodarone restarted.  Carvedilol on hold for permissive hypertension  Hypertension: Takes carvedilol, Cardura, hydralazine, Imdur, valsartan at home.  These medicines are on hold for permissive hypertension.  Currently blood pressure stable  Chronic diastolic CHF: Takes torsemide at home.  Given AKI, it is on hold.  On IV fluids.  Samule Ohm  also held  History of coronary artery disease: History of coronary artery disease status post CABG.  Takes aspirin, Crestor, Zetia at home  Diabetes type 2: A1c pending.  Continue current insulin regimen.  On insulin at home.  Right hand crush injury: Had a superficial laceration of hand about 2 weeks ago after a tire landed on his hand.  Wound examined at the bedside.  No significant discharge or foul smell.  Continue Keflex for now.  Wound care consulted.         DVT prophylaxis:SCDs Start: 01/26/22 1303     Code Status: Full Code  Family Communication: Wife at bedside  Patient status:Inpatient  Patient is from :Home  Anticipated discharge GE:ZMOQ  Estimated DC date:1-2 days, awaiting full neurology work-up, improvement in the kidney function   Consultants: Neurology Procedures: None Antimicrobials:  Anti-infectives (From admission, onward)    None       Subjective:  Patient seen and examined at the bedside this morning .  Wife at bedside to.  Looks comfortable.  Sitting on the edge of the bed.  Alert and oriented.  Has clear speech.  Follows commands.  Left-sided weakness has resolved.  No new Complaints.  Objective: Vitals:   01/26/22 2215 01/27/22 0020 01/27/22 0519 01/27/22 0719  BP: (!) 110/47 (!) 122/56 (!) 126/47 (!) 146/55  Pulse: (!) 52 (!) 55 (!) 58 (!) 57  Resp: '18 18 18 16  '$ Temp: 98.1 F (36.7 C) 98.2 F (36.8 C) 98.1 F (36.7 C) 98 F (36.7 C)   TempSrc: Oral Oral Oral   SpO2: 98% 97% 96% 97%  Weight:      Height:        Intake/Output Summary (Last 24 hours) at 01/27/2022 1106 Last data filed at 01/27/2022 0400 Gross per 24 hour  Intake 480 ml  Output 950 ml  Net -470 ml   Filed Weights   01/26/22 1131  Weight: 100.5 kg    Examination:  General exam: Overall comfortable, not in distress, pleasant elderly male HEENT: PERRL Respiratory system:  no wheezes or crackles  Cardiovascular system: Irregularly irregular rhythm Gastrointestinal system: Abdomen is nondistended, soft and nontender. Central nervous system: Alert and oriented Extremities: No edema, no clubbing ,no cyanosis Skin: No rashes, no ulcers,no icterus  Musculoskeletal: Laceration wound on  right hand covered with dressing   Data Reviewed: I have personally reviewed following labs and imaging studies  CBC: Recent Labs  Lab 01/26/22 1138 01/27/22 0549  WBC 6.5 7.2  NEUTROABS 5.0  --   HGB 12.1* 11.2*  HCT 34.9* 32.5*  MCV 90.9 90.8  PLT 97* 400*   Basic Metabolic Panel: Recent Labs  Lab 01/26/22 1138 01/26/22 1742 01/27/22 0549  NA 138 137 141  K 5.4* 5.4* 4.4  CL 111 112* 116*  CO2 21* 20* 22  GLUCOSE 278* 217* 112*  BUN 51* 49* 44*  CREATININE 2.69* 2.52* 2.38*  CALCIUM 8.4* 8.3* 8.3*     No results found for this or any previous visit (from the past 240 hour(s)).   Radiology Studies: CT HEAD WO CONTRAST (5MM)  Result Date: 01/27/2022 CLINICAL DATA:  Sudden onset left arm and leg numbness and dysarthria EXAM: CT HEAD WITHOUT CONTRAST TECHNIQUE: Contiguous axial images were obtained from the base of the skull through the vertex without intravenous contrast. RADIATION DOSE REDUCTION: This exam was performed according to the departmental dose-optimization program which includes automated exposure control, adjustment of the mA and/or kV according to patient size and/or use of iterative reconstruction technique. COMPARISON:   01/26/2022 CT head FINDINGS: Brain: Hypodensity in the right caudate head (series 2, image 14), which correlates with the infarct seen on the same-day MRI. No evidence of additional acute infarct. No evidence of acute hemorrhage, mass, mass effect, or midline shift. No hydrocephalus or extra-axial fluid collection. Vascular: No hyperdense vessel. Atherosclerotic calcifications in the intracranial carotid and vertebral arteries. Skull: Normal. Negative for fracture or focal lesion. Sinuses/Orbits: Complete opacification of the left sphenoid sinus. Mucosal thickening in the right sphenoid sinus and ethmoid air cells. Status post bilateral lens replacements. Other: The mastoid air cells are well aerated. IMPRESSION: Hypodensity in the right caudate head, which correlates with the infarct seen on the same-day MRI. No evidence of additional acute infarct. No acute intracranial hemorrhage. Electronically Signed   By: Merilyn Baba M.D.   On: 01/27/2022 03:01   MR BRAIN WO CONTRAST  Addendum Date: 01/26/2022   ADDENDUM REPORT: 01/26/2022 21:36 ADDENDUM: On further review, the degree of T2 hyperintensity associated with the right caudate head infarct is greater than what would  be expected for a typical infarct. This could be secondary to the presence of a small amount of infarct associated hemorrhage. Consider repeat head CT to assess for interval change. These results will be called to the ordering clinician or representative by the Radiologist Assistant, and communication documented in the PACS or [employer]. Electronically Signed   By: Marin Roberts M.D.   On: 01/26/2022 21:36   Result Date: 01/26/2022 CLINICAL DATA:  Stroke suspected. EXAM: MRI HEAD WITHOUT CONTRAST TECHNIQUE: Multiplanar, multiecho pulse sequences of the brain and surrounding structures were obtained without intravenous contrast. COMPARISON:  Same-day CT brain and CTA head and neck angiogram FINDINGS: Brain: Acute infarct in the right  caudate head (series 5, image 35). No evidence of hemorrhage. Sequela of mild to moderate chronic microvascular ischemic change. No hydrocephalus. No extra-axial fluid collection. Vascular: Normal flow voids. See same day CT head and neck angiogram for additional findings. Skull and upper cervical spine: There is diffusely heterogeneous marrow signal, particularly in the cervical spine. Sinuses/Orbits: Redemonstrated are changes bilateral sphenoid sinusitis. Bilateral lens replacement. Trace right mastoid effusion. Other: None IMPRESSION: Acute infarct in the right caudate head.  No hemorrhage. Electronically Signed: By: Marin Roberts M.D. On: 01/26/2022 15:11   US RENAL  Result Date: 01/26/2022 CLINICAL DATA:  nse acute kidney injury. EXAM: RENAL / URINARY TRACT ULTRASOUND COMPLETE COMPARISON:  CT abdomen and pelvis 12/25/2020 FINDINGS: Right Kidney: Renal measurements: 13.1 x 6.4 x 6.8 cm = volume: 295 mL. Cortical thinning with increased echogenicity. No hydronephrosis. Left Kidney: Renal measurements: 13.6 x 6.4 x 5.4 cm = volume: 247 mL. Cortical thinning. Small exophytic simple cyst measuring 1.8 cm noted in the lower pole. No hydronephrosis. Bladder: Appears normal for degree of bladder distention. Other: None. IMPRESSION: 1. No hydronephrosis. 2. Bilateral renal cortical thinning with increased echogenicity on the right suggesting chronic medical renal disease. Electronically Signed   By: Misty Stanley M.D.   On: 01/26/2022 14:40   CT ANGIO HEAD NECK W WO CM (CODE STROKE)  Result Date: 01/26/2022 CLINICAL DATA:  Neuro deficit, acute, stroke suspected. Acute left arm and leg numbness and dysarthria. EXAM: CT ANGIOGRAPHY HEAD AND NECK TECHNIQUE: Multidetector CT imaging of the head and neck was performed using the standard protocol during bolus administration of intravenous contrast. Multiplanar CT image reconstructions and MIPs were obtained to evaluate the vascular anatomy. Carotid stenosis  measurements (when applicable) are obtained utilizing NASCET criteria, using the distal internal carotid diameter as the denominator. RADIATION DOSE REDUCTION: This exam was performed according to the departmental dose-optimization program which includes automated exposure control, adjustment of the mA and/or kV according to patient size and/or use of iterative reconstruction technique. CONTRAST:  46m OMNIPAQUE IOHEXOL 350 MG/ML SOLN COMPARISON:  None Available. FINDINGS: CTA NECK FINDINGS Aortic arch: Standard 3 vessel aortic arch with mild atherosclerotic plaque. Mixed calcified and soft plaque in the subclavian arteries without a flow limiting stenosis. Right carotid system: Patent with a small amount of calcified plaque at the carotid bifurcation and minimal calcified plaque in the distal cervical ICA. No evidence of a significant stenosis or dissection. Left carotid system: Patent with a small amount of scattered, predominantly calcified plaque in the common carotid artery, at the carotid bifurcation, and in the mid to distal cervical ICA. No evidence of a significant stenosis or dissection. Vertebral arteries: Patent with the left being dominant. Predominantly calcified plaque at the vertebral artery origins results in moderate to severe stenosis on the left and at most  mild stenosis on the right. Skeleton: Advanced disc degeneration in the cervical and upper thoracic spine. Interbody ankylosis at C3-4, C4-5, C7-T1, T1-2, and T3-4. Other neck: No evidence of cervical lymphadenopathy or mass. Upper chest: Clear lung apices. Review of the MIP images confirms the above findings CTA HEAD FINDINGS Anterior circulation: The internal carotid arteries are patent from skull base to carotid termini with calcified plaque resulting in mild cavernous and paraclinoid stenoses bilaterally. ACAs and MCAs are patent without evidence of a proximal branch occlusion or flow limiting A1 or M1 stenosis. There is an early  bifurcation of the right MCA. A severe mid right P2 branch stenosis is noted. No aneurysm is identified. Posterior circulation: The intracranial vertebral arteries are patent with the right being diminutive distal to the PICA origin. There are severe right and moderate to severe left V4 stenoses due to heavily calcified plaque. Patent PICA and SCA origins are seen bilaterally. The basilar artery is patent with mild atherosclerotic irregularity but no significant stenosis. Posterior communicating arteries are diminutive or absent. The PCAs are patent with widespread advanced atherosclerotic irregularity including severe mid distal right P2 and mid left P2 stenoses. No aneurysm is identified. Venous sinuses: As permitted by contrast timing, patent. Anatomic variants: None of significance. Review of the MIP images confirms the above findings Preliminary finding of no LVO was communicated to Dr. Quinn Axe at 11:27 am on 01/26/2022 by text page via the [facility] messaging system. IMPRESSION: 1. No large vessel occlusion. 2. Advanced intracranial atherosclerosis including severe bilateral P2, V4, and right M2 stenoses. 3. Moderate to severe left vertebral artery origin stenosis. 4. Mild cervical carotid artery atherosclerosis without significant stenosis. 5.  Aortic Atherosclerosis (ICD10-I70.0). Electronically Signed   By: Logan Bores M.D.   On: 01/26/2022 11:41   CT HEAD CODE STROKE WO CONTRAST  Result Date: 01/26/2022 CLINICAL DATA:  Code stroke.  Neuro deficit, acute, stroke suspected EXAM: CT HEAD WITHOUT CONTRAST TECHNIQUE: Contiguous axial images were obtained from the base of the skull through the vertex without intravenous contrast. RADIATION DOSE REDUCTION: This exam was performed according to the departmental dose-optimization program which includes automated exposure control, adjustment of the mA and/or kV according to patient size and/or use of iterative reconstruction technique. COMPARISON:  CT head February 03, 2021. FINDINGS: Brain: No evidence of acute large vascular territory infarction, hemorrhage, hydrocephalus, extra-axial collection or mass lesion/mass effect. Vascular: No hyperdense vessel identified. Calcific atherosclerosis. Skull: No acute fracture. Sinuses/Orbits: Paranasal sinus mucosal thickening with near complete left sphenoid sinus opacification. No acute orbital findings. Other: No mastoid effusions. ASPECTS [facility] Stroke Program Early CT Score) total score (0-10 with 10 being normal): 10. IMPRESSION: 1. No evidence of acute intracranial abnormality. 2. ASPECTS is 10. Code stroke imaging results were communicated on 01/26/2022 at 11:18 am to provider [facility] via secure text paging. Electronically Signed   By: Margaretha Sheffield M.D.   On: 01/26/2022 11:18    Scheduled Meds:  amiodarone  200 mg Oral Daily   aspirin EC  81 mg Oral Daily   ezetimibe  10 mg Oral Daily   finasteride  5 mg Oral QPM   gabapentin  100 mg Oral TID   insulin aspart  0-15 Units Subcutaneous TID WC   insulin glargine-yfgn  20 Units Subcutaneous QHS   multivitamin with minerals  1 tablet Oral Daily   pantoprazole  40 mg Oral Daily   rosuvastatin  20 mg Oral Daily   sodium chloride flush  3 mL Intravenous  Q12H   Continuous Infusions:  sodium chloride 100 mL/hr at 01/27/22 0749     LOS: 0 days   Shelly Coss, MD [facility] P11/23/2023, 11:06 AM

## 2022-01-27 NOTE — Progress Notes (Signed)
S: Patient is overall feeling better, his left sided weakness has improved and he ambulated 2 laps with a walker today. Numbness persists.  MRI brain overnight showed acute infarct R caudate head with small amt of possible hemorrhage. Repeat head CT at 6 hrs showed no frank blood in that region. Imaging personally reviewed  TTE pending  Stroke Labs     Component Value Date/Time   CHOL 81 01/26/2022 1138   CHOL 70 (L) 04/28/2021 1009   CHOL 125 12/27/2017 1446   TRIG 112 01/26/2022 1138   TRIG 289 (H) 12/27/2017 1446   HDL 26 (L) 01/26/2022 1138   HDL 39 (L) 04/28/2021 1009   CHOLHDL 3.1 01/26/2022 1138   VLDL 22 01/26/2022 1138   LDLCALC 33 01/26/2022 1138   LDLCALC 18 04/28/2021 1009   LDLDIRECT 65.0 06/02/2016 1417   LABVLDL 13 04/28/2021 1009    Lab Results  Component Value Date/Time   HGBA1C 7.9 (H) 09/10/2021 02:12 PM   O:  Vitals:   01/27/22 1159 01/27/22 1620  BP: (!) 146/58 (!) 145/52  Pulse: (!) 52 94  Resp: 16 19  Temp: 97.7 F (36.5 C) 97.6 F (36.4 C)  SpO2: 96%    Physical Exam Gen: A&O x4, NAD HEENT: Atraumatic, normocephalic;mucous membranes moist; oropharynx clear, tongue without atrophy or fasciculations. Neck: Supple, trachea midline. Resp: CTAB, no w/r/r CV: RRR, no m/g/r; nml S1 and S2. 2+ symmetric peripheral pulses. Abd: soft/NT/ND; nabs x 4 quad Extrem: Nml bulk; no cyanosis, clubbing, or edema.   Neuro: *MS: A&O x4. Follows multi-step commands.  *Speech: fluid, no dysarthria, able to name and repeat *CN:    I: Deferred   II,III: PERRLA, VFF by confrontation, optic discs unable to be visualized 2/2 pupillary constriction   III,IV,VI: EOMI w/o nystagmus, no ptosis   V: Sensation impaired from V1 to V3 on L   VII: Eyelid closure was full.  L UMN facial droop   VIII: Hearing intact to voice   IX,X: Voice normal, palate elevates symmetrically    XI: SCM/trap 5/5 bilat   XII: Tongue protrudes midline, no atrophy or fasciculations   *Motor:   Normal bulk.  No tremor, rigidity or bradykinesia. Drift but not to bed LLE. No drift BUE and RLE. Decreased grip strength L compared to R has improved but not completely resolved *Sensory: deficit to LT LUE and LLE. No extinction to DSS *Coordination:  FNF intact *Reflexes:  1+ and symmetric throughout without clonus; toes down-going bilat *Gait: unsteady with L limp   NIHSS   1a Level of Conscious.: 0 1b LOC Questions: 0 1c LOC Commands: 0 2 Best Gaze: 0 3 Visual: 0 4 Facial Palsy: 1 5a Motor Arm - left: 0 5b Motor Arm - Right: 0 6a Motor Leg - Left: 1 6b Motor Leg - Right: 0 7 Limb Ataxia: 0 8 Sensory: 1 9 Best Language: 0 10 Dysarthria: 0 11 Extinct. and Inatten.: 0   TOTAL: 3 (from 6 yesterday)     Premorbid mRS = 0  A/P: This is a 88 yo with hx diastolic HF, CAD, HL, HTN, DM2, a fib on eliquis who presents with L sided weakness and numbness of face/arm/leg and dysarthria c/f acute ischemic stroke. Not a candidate for TNK 2/2 being on eliquis. CTA showed no LVO therefore no intervention was indicated. MRI brain overnight showed acute infarct R caudate head with small amt of possible hemorrhage. Repeat head CT at 6 hrs showed no frank blood in that  region.   - Permissive HTN x48 hrs from sx onset goal BP <180/105. PRN labetalol or hydralazine if BP above these parameters. Avoid oral antihypertensives. - TTE - Check A1c and LDL + add statin per guidelines - Hold eliquis and bridge with ASA '81mg'$  daily. Plan for repeat head CT @ hospital day 5 and if no hemorrhagic conversion consider restarting eliquis at that time - OK to give pharmacologic DVT prophylaxis - q4 hr neuro checks - STAT head CT for any change in neuro exam - Tele - PT/OT/SLP - Stroke education - Amb referral to neurology upon discharge - Will continue to follow  Su Monks, MD [facility] [phone]  If 7pm- 7am, please page neurology on call as listed in [place].

## 2022-01-27 NOTE — Plan of Care (Signed)

## 2022-01-27 NOTE — Evaluation (Signed)
Physical Therapy Evaluation Patient Details Name: [name] [name] MRN: [id number] DOB: June 09, 1933 Today's Date: 01/27/2022  History of Present Illness  [name] [name] is a 88 y.o. male with medical history significant of atrial fibrillation on Eliquis, complex CAD s/p CABG, HFpEF, type 2 diabetes, CKD stage III, hypertension, who presents to the ED with complaints of left leg weakness and speech changes. 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  No hemorrhage."  Clinical Impression  Pt supine in bed upon PT entry. Pt mod I with bed mobility. Sit<>Stand with supervision and RW. Pt ambulated 230 ft with RW and CGA. Pt neuro screen WNL except for deficits in bilateral UE fine motor control. Pt progressed to walking without AD and no LOB noted. Pt reported decreased sensation under bilateral feet. Pt would benefit from skilled physical therapy to address the listed deficits (see below) to increase independence with ADLs and function. Current recommendation is HHPT to return pt to PLOF.          Recommendations for follow up therapy are one component of a multi-disciplinary discharge planning process, led by the attending physician.  Recommendations may be updated based on patient status, additional functional criteria and insurance authorization.  Follow Up Recommendations Home health PT      Assistance Recommended at Discharge Intermittent Supervision/Assistance  Patient can return home with the following  A little help with walking and/or transfers;A little help with bathing/dressing/bathroom;Assistance with cooking/housework;Assist for transportation;Help with stairs or ramp for entrance    Equipment Recommendations None recommended by PT  Recommendations for Other Services       Functional Status Assessment Patient has had a recent decline in their functional status and demonstrates the ability to make significant improvements in function in a reasonable and  predictable amount of time.     Precautions / Restrictions Precautions Precautions: Fall Restrictions Weight Bearing Restrictions: No      Mobility  Bed Mobility Overal bed mobility: Modified Independent             General bed mobility comments: HOB elevated    Transfers Overall transfer level: Needs assistance Equipment used: Rolling walker (2 wheels) Transfers: Sit to/from Stand Sit to Stand: Supervision                Ambulation/Gait Ambulation/Gait assistance: Min guard Gait Distance (Feet): 230 Feet Assistive device: Rolling walker (2 wheels) Gait Pattern/deviations: Step-through pattern, Decreased step length - left, Decreased step length - right       General Gait Details: increased time for motor planning and trouble placing is left foot flat on the the ground  Stairs            Wheelchair Mobility    Modified Rankin (Stroke Patients Only)       Balance Overall balance assessment: Needs assistance, History of Falls Sitting-balance support: Feet supported Sitting balance-Leahy Scale: Good     Standing balance support: Bilateral upper extremity supported Standing balance-Leahy Scale: Fair Standing balance comment: able to progress to walking without AD                             Pertinent Vitals/Pain Pain Assessment Faces Pain Scale: Hurts a little bit Pain Intervention(s): Limited activity within patient's tolerance, Repositioned, Monitored during session    [place] expects to be discharged to:: Private residence Living Arrangements: Spouse/significant other Available Help at Discharge: Family;Available 24 hours/day Type of Home: House  Home Access: Ramped entrance       Home Layout: One level Home Equipment: Conservation officer, nature (2 wheels);Rollator (4 wheels);Cane - single point;Hand held shower head;Grab bars - tub/shower;Shower seat - built in      Prior Function Prior Level of Function :  Independent/Modified Independent;Driving;History of Falls (last six months)             Mobility Comments: Independent with no AD for household distances, use of RW PRN for community distances ADLs Comments: independent for ADLs     Hand Dominance        Extremity/Trunk Assessment   Upper Extremity Assessment Upper Extremity Assessment: Overall WFL for tasks assessed RUE Deficits / Details: crush injury from ~2 weeks ago (tire fell on top of hand, wife has been changing dressing), unable to make full composite fist RUE Coordination: decreased fine motor LUE Coordination: decreased fine motor    Lower Extremity Assessment Lower Extremity Assessment: Overall WFL for tasks assessed (pt reported decreased sensation on areas of the bottom of his feet)    Cervical / Trunk Assessment Cervical / Trunk Assessment: Normal  Communication   Communication: No difficulties  Cognition Arousal/Alertness: Awake/alert Behavior During Therapy: WFL for tasks assessed/performed Overall Cognitive Status: Within Functional Limits for tasks assessed                                          General Comments      Exercises     Assessment/Plan    PT Assessment Patient needs continued PT services  PT Problem List Decreased strength;Decreased balance;Decreased mobility;Decreased activity tolerance       PT Treatment Interventions DME instruction;Functional mobility training;Balance training;Patient/family education;Gait training;Therapeutic activities;Neuromuscular re-education;Stair training;Therapeutic exercise    PT Goals (Current goals can be found in the Care Plan section)  Acute Rehab PT Goals Patient Stated Goal: to return home PT Goal Formulation: With patient Time For Goal Achievement: 02/10/22 Potential to Achieve Goals: Good    Frequency 7X/week     Co-evaluation               AM-PAC PT "6 Clicks" Mobility  Outcome Measure Help needed turning  from your back to your side while in a flat bed without using bedrails?: None Help needed moving from lying on your back to sitting on the side of a flat bed without using bedrails?: None Help needed moving to and from a bed to a chair (including a wheelchair)?: None Help needed standing up from a chair using your arms (e.g., wheelchair or bedside chair)?: A Little Help needed to walk in hospital room?: A Little Help needed climbing 3-5 steps with a railing? : A Little 6 Click Score: 21    End of Session Equipment Utilized During Treatment: Gait belt Activity Tolerance: Patient tolerated treatment well Patient left: in chair Nurse Communication: Other (comment) (IV dried blood needed changing) PT Visit Diagnosis: Unsteadiness on feet (R26.81);Repeated falls (R29.6);Other abnormalities of gait and mobility (R26.89);History of falling (Z91.81);Difficulty in walking, not elsewhere classified (R26.2);Other symptoms and signs involving the nervous system (R29.898)    Time: 1497-0263 PT Time Calculation (min) (ACUTE ONLY): 24 min   Charges:             Claiborne Billings O'Daniel, SPT  01/27/2022, 12:04 PM

## 2022-01-28 LAB — BASIC METABOLIC PANEL
Anion gap: 6 (ref 5–15)
BUN: 36 mg/dL — ABNORMAL HIGH (ref 8–23)
CO2: 23 mmol/L (ref 22–32)
Calcium: 8.2 mg/dL — ABNORMAL LOW (ref 8.9–10.3)
Chloride: 116 mmol/L — ABNORMAL HIGH (ref 98–111)
Creatinine, Ser: 1.89 mg/dL — ABNORMAL HIGH (ref 0.61–1.24)
GFR, Estimated: 34 mL/min — ABNORMAL LOW (ref >60)
Glucose, Bld: 108 mg/dL — ABNORMAL HIGH (ref 70–99)
Potassium: 4.1 mmol/L (ref 3.5–5.1)
Sodium: 145 mmol/L (ref 135–145)

## 2022-01-28 LAB — GLUCOSE, CAPILLARY
Glucose-Capillary: 196 mg/dL — ABNORMAL HIGH (ref 70–99)
Glucose-Capillary: 248 mg/dL — ABNORMAL HIGH (ref 70–99)
Glucose-Capillary: 108 mg/dL — ABNORMAL HIGH (ref 70–99)
Glucose-Capillary: 178 mg/dL — ABNORMAL HIGH (ref 70–99)

## 2022-01-28 MED ORDER — HYDRALAZINE HCL 20 MG/ML IJ SOLN
10.0000 mg | Freq: Four times a day (QID) | INTRAMUSCULAR | Status: DC | PRN
  Filled 2022-01-28: qty 1

## 2022-01-28 MED ORDER — HYDRALAZINE HCL 20 MG/ML IJ SOLN
10.0000 mg | Freq: Four times a day (QID) | INTRAMUSCULAR | Status: DC | PRN
  Administered 2022-01-28 – 2022-01-30 (×2): 10 mg via INTRAVENOUS
  Filled 2022-01-28: qty 1

## 2022-01-28 MED ORDER — ENOXAPARIN SODIUM 40 MG/0.4ML IJ SOSY
40.0000 mg | PREFILLED_SYRINGE | INTRAMUSCULAR | Status: DC
  Administered 2022-01-28 – 2022-01-30 (×3): 40 mg via SUBCUTANEOUS
  Filled 2022-01-28 (×3): qty 0.4

## 2022-01-28 NOTE — Consult Note (Signed)
[facility] Nurse Consult Note: Reason for Consult:right dorsum of hand with skin tear, avulsion Wound type:trauma Pressure Injury POA: Yes Measurement:after flap replaced, 1.5cm x 1cm x 0.2cm open area remains,per Nursing Flow Sheet Wound bed:red, moist Drainage (amount, consistency, odor) small serosanguinous Periwound: friable, ecchymosis Dressing procedure/placement/frequency:I have provided Nursing with guidance for the care of this skin injury using a daily NS cleanse followed by gentle pat dry.Topical care wil be with placement of a silicone nonadherent gauze dressing (Mepitel) topped with folded layers of xeroform (antimicrobial nonadherent). This is to be covered with dry gauze and secured with conform bandaging/paper tape. Changes are to be daily  House PI prevention measures are also provided, including but not limited to placement of a silicone foam dressing to the sacrum, placement of feet into bilateral Prevalon Boots, turning and repositioning to minimize time in the supine position and provision of a pressure redistribution chair cushion for times when OOB in the chair.  [place] nursing team will not follow, but will remain available to this patient, the nursing and medical teams.  Please re-consult if needed.  Thank you for inviting [place] to participate in this patient's Plan of Care.  Maudie Flakes, MSN, RN, CNS, [place], Serita Grammes, [employer], [employer] phone:  [phone]

## 2022-01-28 NOTE — Progress Notes (Signed)
       CROSS COVER NOTE  NAME: [name] [name] MRN: [id number] DOB : [birth year]    Time of Service   2024 PM  HPI/Events of Note   Nurse reports hypertension with BP currently 189/64, no neuro changes or symptoms.  HR 54.  Patient admitted with left leg weakness and found to have acute infarct on right caudate head with  possible concern small amount of hemorrhage. Per neurology goal BP  <180/105  Assessment and  Interventions   Assessment:  Plan: Hydralazine 10 mg every 6 h prn for BP above 180/105

## 2022-01-28 NOTE — Progress Notes (Signed)
Physical Therapy Treatment Patient Details Name: [name] [name] MRN: [id number] DOB: 16-May-1933 Today's Date: 01/28/2022   History of Present Illness [name] [name] is a 88 y.o. male with medical history significant of atrial fibrillation on Eliquis, complex CAD s/p CABG, HFpEF, type 2 diabetes, CKD stage III, hypertension, who presents to the ED with complaints of left leg weakness and speech changes. 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  No hemorrhage."    PT Comments    Patient motivated to participate/progress; hopeful for upcoming discharge home.  Has been mobilizing around unit with RW and wife sup/assist and feels comfortable with current performance/level of assist.  Higher level balance deficits appreciated, esp with activities involving narrowed/altered BOS or SLS, as evidenced by BERG score 31/56. Do recommend continued use of RW for optimal safety/indep; patient voiced agreement.  Discussed options for HHPT vs. Outpatient PT at discharge to address balance/mobility deficits; patient open to both options, but requests to know/understand insurance benefits of each.  TOC informed/aware.  Given mobility status and patient willingness/ability to mobilize with staff/family outside of therapy, will update frequency from 7x/week to 2x/week, enrolling with mobility specialist team to ensure progressive mobility efforts in addition to continued skilled PT/OT services.    Recommendations for follow up therapy are one component of a multi-disciplinary discharge planning process, led by the attending physician.  Recommendations may be updated based on patient status, additional functional criteria and insurance authorization.  Follow Up Recommendations  Home health PT     Assistance Recommended at Discharge Intermittent Supervision/Assistance  Patient can return home with the following A little help with walking and/or transfers;A little help with  bathing/dressing/bathroom;Assistance with cooking/housework;Assist for transportation;Help with stairs or ramp for entrance   Equipment Recommendations  None recommended by PT (has access to RW at home)    Recommendations for Other Services       Precautions / Restrictions Precautions Precautions: Fall Restrictions Weight Bearing Restrictions: No     Mobility  Bed Mobility               General bed mobility comments: seated edge of bed upon arrival to session; in recliner end of session    Transfers Overall transfer level: Needs assistance Equipment used: Rolling walker (2 wheels) Transfers: Sit to/from Stand Sit to Stand: Min guard           General transfer comment: able to complete without UE support when cued (increased use of momentum), but prefers use of UEs for optimal stability    Ambulation/Gait Ambulation/Gait assistance: Min guard, Supervision Gait Distance (Feet): 200 Feet Assistive device: Rolling walker (2 wheels)         General Gait Details: reciprocal stepping pattern with fair step height/length; mod WBing on RW.  Slow and deliberate cadence, but no overt buckling or LOB.  Continues to endorse feeling like he's "stepping on a balloon" when loading L LE due to persistent plantar sensory deficits   Stairs             Wheelchair Mobility    Modified Rankin (Stroke Patients Only)       Balance Overall balance assessment: Needs assistance Sitting-balance support: No upper extremity supported, Feet supported Sitting balance-Leahy Scale: Good     Standing balance support: No upper extremity supported Standing balance-Leahy Scale: Fair                   Standardized Balance Assessment Standardized Balance Assessment : Oceanographer Test  Berg Balance Test Sit to Stand: Able to stand without using hands and stabilize independently Standing Unsupported: Able to stand safely 2 minutes Sitting with Back Unsupported but Feet  Supported on Floor or Stool: Able to sit safely and securely 2 minutes Stand to Sit: Sits safely with minimal use of hands Transfers: Able to transfer safely, definite need of hands Standing Unsupported with Eyes Closed: Able to stand 10 seconds with supervision Standing Ubsupported with Feet Together: Needs help to attain position but able to stand for 30 seconds with feet together From Standing, Reach Forward with Outstretched Arm: Can reach forward >5 cm safely (2") From Standing Position, Pick up Object from Floor: Unable to try/needs assist to keep balance From Standing Position, Turn to Look Behind Over each Shoulder: Turn sideways only but maintains balance Turn 360 Degrees: Needs close supervision or verbal cueing Standing Unsupported, Alternately Place Feet on Step/Stool: Able to complete >2 steps/needs minimal assist Standing Unsupported, One Foot in Front: Needs help to step but can hold 15 seconds Standing on One Leg: Tries to lift leg/unable to hold 3 seconds but remains standing independently Total Score: 31        Cognition Arousal/Alertness: Awake/alert Behavior During Therapy: WFL for tasks assessed/performed Overall Cognitive Status: Within Functional Limits for tasks assessed                                          Exercises      General Comments        Pertinent Vitals/Pain Pain Assessment Pain Assessment: No/denies pain    Home Living                          Prior Function            PT Goals (current goals can now be found in the care plan section) Acute Rehab PT Goals Patient Stated Goal: to return home PT Goal Formulation: With patient Time For Goal Achievement: 02/10/22 Potential to Achieve Goals: Good Progress towards PT goals: Progressing toward goals    Frequency    Min 2X/week      PT Plan Current plan remains appropriate    Co-evaluation              AM-PAC PT "6 Clicks" Mobility   Outcome  Measure  Help needed turning from your back to your side while in a flat bed without using bedrails?: None Help needed moving from lying on your back to sitting on the side of a flat bed without using bedrails?: None Help needed moving to and from a bed to a chair (including a wheelchair)?: None Help needed standing up from a chair using your arms (e.g., wheelchair or bedside chair)?: A Little Help needed to walk in hospital room?: A Little Help needed climbing 3-5 steps with a railing? : A Little 6 Click Score: 21    End of Session Equipment Utilized During Treatment: Gait belt Activity Tolerance: Patient tolerated treatment well Patient left: in chair;with call bell/phone within reach;with family/visitor present   PT Visit Diagnosis: Unsteadiness on feet (R26.81);Repeated falls (R29.6);Other abnormalities of gait and mobility (R26.89);History of falling (Z91.81);Difficulty in walking, not elsewhere classified (R26.2);Other symptoms and signs involving the nervous system (R29.898)     Time: 5176-1607 PT Time Calculation (min) (ACUTE ONLY): 23 min  Charges:  $Gait Training: 8-22 mins $  Neuromuscular Re-education: 8-22 mins                    Azoria Abbett H. Owens Shark, PT, DPT, NCS 01/28/22, 2:22 PM [phone]

## 2022-01-28 NOTE — Progress Notes (Signed)
Occupational Therapy Treatment Patient Details Name: [name] [name] MRN: [id number] DOB: [birth year] Today's Date: 01/28/2022   History of present illness [name] [name] is a 88 y.o. male with medical history significant of atrial fibrillation on Eliquis, complex CAD s/p CABG, HFpEF, type 2 diabetes, CKD stage III, hypertension, who presents to the ED with complaints of left leg weakness and speech changes. 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  No hemorrhage."   OT comments   Pt seen for OT tx. Pt endorsing feeling much better and eager to return home. Pt/spouse educated in positioning for edema prevention and improved comfort for R hand. Also instructed in R hand gentle AROM and FMC exercises to complete as tolerated with pain mgt. Pt verbalized understanding and able to return demo AROM and ex. Pt progressing towards goals.    Recommendations for follow up therapy are one component of a multi-disciplinary discharge planning process, led by the attending physician.  Recommendations may be updated based on patient status, additional functional criteria and insurance authorization.    Follow Up Recommendations  Home health OT     Assistance Recommended at Discharge Intermittent Supervision/Assistance  Patient can return home with the following  A little help with walking and/or transfers;A little help with bathing/dressing/bathroom;Assistance with cooking/housework;Assist for transportation;Help with stairs or ramp for entrance   Equipment Recommendations  BSC/3in1    Recommendations for Other Services      Precautions / Restrictions Precautions Precautions: Fall Restrictions Weight Bearing Restrictions: No       Mobility Bed Mobility               General bed mobility comments: in recliner    Transfers Overall transfer level: Needs assistance Equipment used: Rolling walker (2 wheels)   Sit to Stand: Supervision                  Balance Overall balance assessment: Needs assistance, History of Falls Sitting-balance support: Feet supported Sitting balance-Leahy Scale: Good     Standing balance support: Bilateral upper extremity supported Standing balance-Leahy Scale: Fair                             ADL either performed or assessed with clinical judgement   ADL                                              Extremity/Trunk Assessment              Vision       Perception     Praxis      Cognition Arousal/Alertness: Awake/alert Behavior During Therapy: WFL for tasks assessed/performed Overall Cognitive Status: Within Functional Limits for tasks assessed                                          Exercises Other Exercises Other Exercises: Pt/spouse educated in positioning for edema prevention and improved comfort for R hand. Also instructed in R hand gentle AROM and FMC exercises to complete as tolerated with pain mgt    Shoulder Instructions       General Comments      Pertinent Vitals/ Pain       Pain Assessment  Pain Assessment: 0-10 Pain Score: 3  Pain Location: R hand Pain Descriptors / Indicators: Sore, Aching Pain Intervention(s): Limited activity within patient's tolerance, Monitored during session, Repositioned  Home Living                                          Prior Functioning/Environment              Frequency  Min 2X/week        Progress Toward Goals  OT Goals(current goals can now be found in the care plan section)  Progress towards OT goals: Progressing toward goals  Acute Rehab OT Goals Patient Stated Goal: go home OT Goal Formulation: With patient Time For Goal Achievement: 02/09/22 Potential to Achieve Goals: Good  Plan Frequency remains appropriate;Discharge plan remains appropriate    Co-evaluation                 AM-PAC OT "6 Clicks" Daily Activity     Outcome  Measure   Help from another person eating meals?: A Little Help from another person taking care of personal grooming?: A Little Help from another person toileting, which includes using toliet, bedpan, or urinal?: A Little Help from another person bathing (including washing, rinsing, drying)?: A Little Help from another person to put on and taking off regular upper body clothing?: A Little Help from another person to put on and taking off regular lower body clothing?: A Little 6 Click Score: 18    End of Session Equipment Utilized During Treatment: Rolling walker (2 wheels)  OT Visit Diagnosis: Unsteadiness on feet (R26.81);Repeated falls (R29.6);Other symptoms and signs involving the nervous system (R29.898);Pain Pain - Right/Left: Right Pain - part of body: Hand   Activity Tolerance Patient tolerated treatment well   Patient Left in chair;with call bell/phone within reach;with family/visitor present   Nurse Communication          Time: 3762-8315 OT Time Calculation (min): 12 min  Charges: OT General Charges $OT Visit: 1 Visit OT Treatments $Therapeutic Exercise: 8-22 mins  Ardeth Perfect., MPH, MS, OTR/L ascom [phone] 01/28/22, 12:39 PM

## 2022-01-28 NOTE — Plan of Care (Signed)
Patient has had good day today. Has been out of bed  to chair most of the shift. No complaints of pain, No BM today, states last BM was Wednesday. Will continue to monitor

## 2022-01-28 NOTE — Plan of Care (Signed)
Neurology will f/u with patient hospital day 5 prior to restarting anticoagulation. Please reach out if any new neurologic concerns develop in the meantime.  Su Monks, MD [facility] [phone]  If 7pm- 7am, please page neurology on call as listed in [place].

## 2022-01-28 NOTE — Progress Notes (Signed)
PROGRESS NOTE [name] [name]  OVF:[id number] DOB: 21-Sep-1933 DOA: 01/26/2022 PCP: [name], MD   Brief Narrative/Hospital Course:  88 year old male with history of permanent A-fib, coronary artery disease status post CABG, heart failure with preserved ejection fraction, type 2 diabetes, CKD stage IIIb, hypertension who presented with complaint of left leg weakness, slurred speech from home.  On presentation ,he was hemodynamically stable.  Lab work showed creatinine of 2.6.   Code Stroke was called.  CT head without any acute findings.  MRI of the brain showed acute infarct on right caudate head. Neuro consulted and admitted for further work-up   Subjective: Seen and examined this morning.  Complains of "being sleepy otherwise he has been ambulatory.  No complaints.  Wife at the bedside Overnight heart rate in 50s to 60s, saturating well on room air,  SBP:140s-160s  Assessment and Plan: Principal Problem:   Acute CVA (cerebrovascular accident) [facility]) Active Problems:   Acute kidney injury superimposed on chronic kidney disease ([place])   Atrial fibrillation, chronic (HCC)   Essential hypertension   Chronic diastolic CHF (congestive heart failure) (HCC)   CAD (coronary artery disease)   Type 2 diabetes mellitus with complication, without long-term current use of insulin (HCC)   Thrombocytopenia (HCC)   Hand crush injury, right, sequela   Stroke ([place])  Acute ischemic stroke with left-sided weakness numbness of face arm leg and dysarthria: MRI showed acute infarct of the right caudate head> with a small amount of possible hemorrhage, repeat CT head at discharge no frank blood. CT angiogram did not show any LVO  but showed bilateral P2 , V4, right M2 stenosis. Cont aspirin, Zetia and Crestor.  LDL controlled 33.  hbA1c pending.  Echocardiogram pending.  Continue PT OT> plan for [facility] Per neurology permissive hypertension x48 hours, okay for chemical DVT prophylaxis, continue holding  Eliquis and bridge with aspirin 81 mg daily with plan to repeat CT head at Hospital day 5 and if no hemorrhagic conversion consider resuming Eliquis    Aki on CKD stage IIIb: b/l creatinine from 1.5-1.7.  AKI improved w/ ivf.  Encourage oral intake DC IV fluids Recent Labs    02/03/21 0303 02/12/21 1452 09/10/21 1412 01/26/22 1138 01/26/22 1742 01/27/22 0549 01/28/22 0606  BUN 28* 25 23 51* 49* 44* 36*  CREATININE 1.47* 1.52* 1.64* 2.69* 2.52* 2.38* 1.89*    Chronic A-fib: Rate controlled Eliquis on hold per neurology.  Continue amiodarone, Coreg on hold to allow permissive hypertension.     Hypertension: PTA  on Carvedilol, Cardura, hydralazine, Imdur, valsartan> currently on hold to allow permissive hypertension x48 hours> likely resume in the morning   Chronic diastolic CHF: Holding torsemide due to AKI, stop IV fluids today.Samule Ohm  also held.  Monitor daily weight and fluid balance Net IO Since Admission: 713.36 mL [01/28/22 1122]   Hyperkalemia resolved  CAD: S/P cabg: No chest pain, continue aspirin, Crestor, Zetia   Diabetes type 2: A1c pending.  With uncontrolled hyperglycemia up to 313, blood sugars fairly stable continue current Lantus Recent Labs  Lab 01/27/22 0721 01/27/22 1156 01/27/22 1615 01/27/22 2039 01/28/22 0732  GLUCAP 88 313* 238* 179* 196*     Right hand crush injury: Had a superficial laceration of hand about 2 weeks ago after a tire landed on his hand.  No foul smell or discharge.  Patient on empiric Keflex wound care consulted    DVT prophylaxis: SCDs Start: 01/26/22 1303 Code Status:   Code Status: Full Code Family  Communication: plan of care discussed with patient/wife at bedside. Patient status is: Inpatient because of acute stroke work-up Level of care: Telemetry Medical   Dispo: The patient is from: Home            Anticipated disposition: Home once cleared by neurology Objective: Vitals last 24 hrs: Vitals:   01/27/22 2021 01/28/22 0041  01/28/22 0603 01/28/22 0733  BP: (!) 158/65 (!) 166/58 (!) 151/76 (!) 164/62  Pulse: (!) 50 (!) 52 (!) 58 65  Resp: '18 20 18 16  '$ Temp: 97.7 F (36.5 C) 98.7 F (37.1 C) 98 F (36.7 C) 98.5 F (36.9 C)  TempSrc: Oral     SpO2: 99% 97% 95% 96%  Weight:      Height:       Weight change:   Physical Examination: General exam: alert awake, older than stated age HEENT:Oral mucosa moist, Ear/Nose WNL grossly Respiratory system: bilaterally clear BS, no use of accessory muscle Cardiovascular system: S1 & S2 +, No JVD. Gastrointestinal system: Abdomen soft,NT,ND, BS+ Nervous System:Alert, awake, moving all his extremities and intact sensation  Extremities: LE edema neg,distal peripheral pulses palpable.  Skin: No rashes,no icterus. MSK: Normal muscle bulk,tone, power  Medications reviewed:  Scheduled Meds:  amiodarone  200 mg Oral Daily   aspirin EC  81 mg Oral Daily   cephALEXin  500 mg Oral Q8H   ezetimibe  10 mg Oral Daily   finasteride  5 mg Oral QPM   gabapentin  100 mg Oral TID   insulin aspart  0-15 Units Subcutaneous TID WC   insulin glargine-yfgn  20 Units Subcutaneous QHS   multivitamin with minerals  1 tablet Oral Daily   pantoprazole  40 mg Oral Daily   rosuvastatin  20 mg Oral Daily   sodium chloride flush  3 mL Intravenous Q12H   Continuous Infusions:  sodium chloride 100 mL/hr at 01/28/22 0114    Diet Order             Diet heart healthy/carb modified Room service appropriate? Yes; Fluid consistency: Thin  Diet effective now                  Intake/Output Summary (Last 24 hours) at 01/28/2022 1113 Last data filed at 01/27/2022 2255 Gross per 24 hour  Intake 1183.36 ml  Output --  Net 1183.36 ml   Net IO Since Admission: 713.36 mL [01/28/22 1113]  Wt Readings from Last 3 Encounters:  01/26/22 100.5 kg  01/17/22 98.4 kg  10/15/21 98.5 kg     Unresulted Labs (From admission, onward)     Start     Ordered   01/26/22 1304  Hemoglobin A1c  Once,    R        01/26/22 1303          Data Reviewed: I have personally reviewed following labs and imaging studies CBC: Recent Labs  Lab 01/26/22 1138 01/27/22 0549  WBC 6.5 7.2  NEUTROABS 5.0  --   HGB 12.1* 11.2*  HCT 34.9* 32.5*  MCV 90.9 90.8  PLT 97* 008*   Basic Metabolic Panel: Recent Labs  Lab 01/26/22 1138 01/26/22 1742 01/27/22 0549 01/28/22 0606  NA 138 137 141 145  K 5.4* 5.4* 4.4 4.1  CL 111 112* 116* 116*  CO2 21* 20* 22 23  GLUCOSE 278* 217* 112* 108*  BUN 51* 49* 44* 36*  CREATININE 2.69* 2.52* 2.38* 1.89*  CALCIUM 8.4* 8.3* 8.3* 8.2*  Antimicrobials: Anti-infectives (From admission,  onward)    Start     Dose/Rate Route Frequency Ordered Stop   01/27/22 1400  cephALEXin (KEFLEX) capsule 500 mg  Status:  Discontinued        500 mg Oral Every 8 hours 01/27/22 1117 01/27/22 1117   01/27/22 1400  cephALEXin (KEFLEX) capsule 500 mg        500 mg Oral Every 8 hours 01/27/22 1117 02/03/22 1359      Culture/Microbiology    Component Value Date/Time   SDES  04/08/2020 1147    URINE, RANDOM Performed at [facility], [address] [place], [place] [address]    [facility]  04/08/2020 1147    NONE Performed at [facility], [address] [place], [place] [address]    CULT  04/08/2020 1147    NO GROWTH Performed at [facility], [place] [address], [place], [place] [address]    REPTSTATUS 04/09/2020 FINAL 04/08/2020 1147  Radiology Studies: CT HEAD WO CONTRAST (5MM)  Result Date: 01/27/2022 CLINICAL DATA:  Sudden onset left arm and leg numbness and dysarthria EXAM: CT HEAD WITHOUT CONTRAST TECHNIQUE: Contiguous axial images were obtained from the base of the skull through the vertex without intravenous contrast. RADIATION DOSE REDUCTION: This exam was performed according to the departmental dose-optimization program which includes automated exposure control, adjustment of the mA and/or kV according to patient size and/or use  of iterative reconstruction technique. COMPARISON:  01/26/2022 CT head FINDINGS: Brain: Hypodensity in the right caudate head (series 2, image 14), which correlates with the infarct seen on the same-day MRI. No evidence of additional acute infarct. No evidence of acute hemorrhage, mass, mass effect, or midline shift. No hydrocephalus or extra-axial fluid collection. Vascular: No hyperdense vessel. Atherosclerotic calcifications in the intracranial carotid and vertebral arteries. Skull: Normal. Negative for fracture or focal lesion. Sinuses/Orbits: Complete opacification of the left sphenoid sinus. Mucosal thickening in the right sphenoid sinus and ethmoid air cells. Status post bilateral lens replacements. Other: The mastoid air cells are well aerated. IMPRESSION: Hypodensity in the right caudate head, which correlates with the infarct seen on the same-day MRI. No evidence of additional acute infarct. No acute intracranial hemorrhage. Electronically Signed   By: Merilyn Baba M.D.   On: 01/27/2022 03:01   MR BRAIN WO CONTRAST  Addendum Date: 01/26/2022   ADDENDUM REPORT: 01/26/2022 21:36 ADDENDUM: On further review, the degree of T2 hyperintensity associated with the right caudate head infarct is greater than what would be expected for a typical infarct. This could be secondary to the presence of a small amount of infarct associated hemorrhage. Consider repeat head CT to assess for interval change. These results will be called to the ordering clinician or representative by the Radiologist Assistant, and communication documented in the PACS or [employer]. Electronically Signed   By: Marin Roberts M.D.   On: 01/26/2022 21:36   Result Date: 01/26/2022 CLINICAL DATA:  Stroke suspected. EXAM: MRI HEAD WITHOUT CONTRAST TECHNIQUE: Multiplanar, multiecho pulse sequences of the brain and surrounding structures were obtained without intravenous contrast. COMPARISON:  Same-day CT brain and CTA head and neck  angiogram FINDINGS: Brain: Acute infarct in the right caudate head (series 5, image 35). No evidence of hemorrhage. Sequela of mild to moderate chronic microvascular ischemic change. No hydrocephalus. No extra-axial fluid collection. Vascular: Normal flow voids. See same day CT head and neck angiogram for additional findings. Skull and upper cervical spine: There is diffusely heterogeneous marrow signal, particularly in the cervical spine.  Sinuses/Orbits: Redemonstrated are changes bilateral sphenoid sinusitis. Bilateral lens replacement. Trace right mastoid effusion. Other: None IMPRESSION: Acute infarct in the right caudate head.  No hemorrhage. Electronically Signed: By: Marin Roberts M.D. On: 01/26/2022 15:11   US RENAL  Result Date: 01/26/2022 CLINICAL DATA:  nse acute kidney injury. EXAM: RENAL / URINARY TRACT ULTRASOUND COMPLETE COMPARISON:  CT abdomen and pelvis 12/25/2020 FINDINGS: Right Kidney: Renal measurements: 13.1 x 6.4 x 6.8 cm = volume: 295 mL. Cortical thinning with increased echogenicity. No hydronephrosis. Left Kidney: Renal measurements: 13.6 x 6.4 x 5.4 cm = volume: 247 mL. Cortical thinning. Small exophytic simple cyst measuring 1.8 cm noted in the lower pole. No hydronephrosis. Bladder: Appears normal for degree of bladder distention. Other: None. IMPRESSION: 1. No hydronephrosis. 2. Bilateral renal cortical thinning with increased echogenicity on the right suggesting chronic medical renal disease. Electronically Signed   By: Misty Stanley M.D.   On: 01/26/2022 14:40   CT ANGIO HEAD NECK W WO CM (CODE STROKE)  Result Date: 01/26/2022 CLINICAL DATA:  Neuro deficit, acute, stroke suspected. Acute left arm and leg numbness and dysarthria. EXAM: CT ANGIOGRAPHY HEAD AND NECK TECHNIQUE: Multidetector CT imaging of the head and neck was performed using the standard protocol during bolus administration of intravenous contrast. Multiplanar CT image reconstructions and MIPs were obtained to  evaluate the vascular anatomy. Carotid stenosis measurements (when applicable) are obtained utilizing NASCET criteria, using the distal internal carotid diameter as the denominator. RADIATION DOSE REDUCTION: This exam was performed according to the departmental dose-optimization program which includes automated exposure control, adjustment of the mA and/or kV according to patient size and/or use of iterative reconstruction technique. CONTRAST:  69m OMNIPAQUE IOHEXOL 350 MG/ML SOLN COMPARISON:  None Available. FINDINGS: CTA NECK FINDINGS Aortic arch: Standard 3 vessel aortic arch with mild atherosclerotic plaque. Mixed calcified and soft plaque in the subclavian arteries without a flow limiting stenosis. Right carotid system: Patent with a small amount of calcified plaque at the carotid bifurcation and minimal calcified plaque in the distal cervical ICA. No evidence of a significant stenosis or dissection. Left carotid system: Patent with a small amount of scattered, predominantly calcified plaque in the common carotid artery, at the carotid bifurcation, and in the mid to distal cervical ICA. No evidence of a significant stenosis or dissection. Vertebral arteries: Patent with the left being dominant. Predominantly calcified plaque at the vertebral artery origins results in moderate to severe stenosis on the left and at most mild stenosis on the right. Skeleton: Advanced disc degeneration in the cervical and upper thoracic spine. Interbody ankylosis at C3-4, C4-5, C7-T1, T1-2, and T3-4. Other neck: No evidence of cervical lymphadenopathy or mass. Upper chest: Clear lung apices. Review of the MIP images confirms the above findings CTA HEAD FINDINGS Anterior circulation: The internal carotid arteries are patent from skull base to carotid termini with calcified plaque resulting in mild cavernous and paraclinoid stenoses bilaterally. ACAs and MCAs are patent without evidence of a proximal branch occlusion or flow  limiting A1 or M1 stenosis. There is an early bifurcation of the right MCA. A severe mid right P2 branch stenosis is noted. No aneurysm is identified. Posterior circulation: The intracranial vertebral arteries are patent with the right being diminutive distal to the PICA origin. There are severe right and moderate to severe left V4 stenoses due to heavily calcified plaque. Patent PICA and SCA origins are seen bilaterally. The basilar artery is patent with mild atherosclerotic irregularity but no significant stenosis. Posterior  communicating arteries are diminutive or absent. The PCAs are patent with widespread advanced atherosclerotic irregularity including severe mid distal right P2 and mid left P2 stenoses. No aneurysm is identified. Venous sinuses: As permitted by contrast timing, patent. Anatomic variants: None of significance. Review of the MIP images confirms the above findings Preliminary finding of no LVO was communicated to Dr. Quinn Axe at 11:27 am on 01/26/2022 by text page via the [facility] messaging system. IMPRESSION: 1. No large vessel occlusion. 2. Advanced intracranial atherosclerosis including severe bilateral P2, V4, and right M2 stenoses. 3. Moderate to severe left vertebral artery origin stenosis. 4. Mild cervical carotid artery atherosclerosis without significant stenosis. 5.  Aortic Atherosclerosis (ICD10-I70.0). Electronically Signed   By: Logan Bores M.D.   On: 01/26/2022 11:41   CT HEAD CODE STROKE WO CONTRAST  Result Date: 01/26/2022 CLINICAL DATA:  Code stroke.  Neuro deficit, acute, stroke suspected EXAM: CT HEAD WITHOUT CONTRAST TECHNIQUE: Contiguous axial images were obtained from the base of the skull through the vertex without intravenous contrast. RADIATION DOSE REDUCTION: This exam was performed according to the departmental dose-optimization program which includes automated exposure control, adjustment of the mA and/or kV according to patient size and/or use of iterative  reconstruction technique. COMPARISON:  CT head February 03, 2021. FINDINGS: Brain: No evidence of acute large vascular territory infarction, hemorrhage, hydrocephalus, extra-axial collection or mass lesion/mass effect. Vascular: No hyperdense vessel identified. Calcific atherosclerosis. Skull: No acute fracture. Sinuses/Orbits: Paranasal sinus mucosal thickening with near complete left sphenoid sinus opacification. No acute orbital findings. Other: No mastoid effusions. ASPECTS [facility] Stroke Program Early CT Score) total score (0-10 with 10 being normal): 10. IMPRESSION: 1. No evidence of acute intracranial abnormality. 2. ASPECTS is 10. Code stroke imaging results were communicated on 01/26/2022 at 11:18 am to provider [facility] via secure text paging. Electronically Signed   By: Margaretha Sheffield M.D.   On: 01/26/2022 11:18     LOS: 1 day   Antonieta Pert, MD [facility]  01/28/2022, 11:13 AM

## 2022-01-28 NOTE — TOC Progression Note (Signed)
Transition of Care [facility]) - Progression Note    Patient Details  Name: [name] [name] MRN: [id number] Date of Birth: [birth year]  Transition of Care [facility]) CM/SW Contact  Quin Hoop, LCSW Phone Number: 01/28/2022, 2:59 PM  Clinical Narrative:    OT rec for 3-in-1 declined per Erasmo Downer and confirmed by CSW via phone call to pt.  Information relayed to MD.  [facility] services to be provided by [facility].        Expected Discharge Plan and Services                                                 Social Determinants of Health (SDOH) Interventions    Readmission Risk Interventions     No data to display

## 2022-01-29 LAB — GLUCOSE, CAPILLARY
Glucose-Capillary: 141 mg/dL — ABNORMAL HIGH (ref 70–99)
Glucose-Capillary: 236 mg/dL — ABNORMAL HIGH (ref 70–99)
Glucose-Capillary: 109 mg/dL — ABNORMAL HIGH (ref 70–99)
Glucose-Capillary: 255 mg/dL — ABNORMAL HIGH (ref 70–99)

## 2022-01-29 LAB — BASIC METABOLIC PANEL
Anion gap: 4 — ABNORMAL LOW (ref 5–15)
BUN: 27 mg/dL — ABNORMAL HIGH (ref 8–23)
CO2: 21 mmol/L — ABNORMAL LOW (ref 22–32)
Calcium: 8.2 mg/dL — ABNORMAL LOW (ref 8.9–10.3)
Chloride: 115 mmol/L — ABNORMAL HIGH (ref 98–111)
Creatinine, Ser: 1.79 mg/dL — ABNORMAL HIGH (ref 0.61–1.24)
GFR, Estimated: 36 mL/min — ABNORMAL LOW (ref >60)
Glucose, Bld: 173 mg/dL — ABNORMAL HIGH (ref 70–99)
Potassium: 4.2 mmol/L (ref 3.5–5.1)
Sodium: 140 mmol/L (ref 135–145)

## 2022-01-29 LAB — CBC
HCT: 32.5 % — ABNORMAL LOW (ref 39.0–52.0)
Hemoglobin: 11.2 g/dL — ABNORMAL LOW (ref 13.0–17.0)
MCH: 31.5 pg (ref 26.0–34.0)
MCHC: 34.5 g/dL (ref 30.0–36.0)
MCV: 91.5 fL (ref 80.0–100.0)
Platelets: 104 10*3/uL — ABNORMAL LOW (ref 150–400)
RBC: 3.55 MIL/uL — ABNORMAL LOW (ref 4.22–5.81)
RDW: 12.9 % (ref 11.5–15.5)
WBC: 8 10*3/uL (ref 4.0–10.5)
nRBC: 0 % (ref 0.0–0.2)

## 2022-01-29 MED ORDER — BENZONATATE 100 MG PO CAPS
100.0000 mg | ORAL_CAPSULE | Freq: Three times a day (TID) | ORAL | Status: DC
  Administered 2022-01-29 – 2022-01-31 (×5): 100 mg via ORAL
  Filled 2022-01-29 (×5): qty 1

## 2022-01-29 MED ORDER — IRBESARTAN 150 MG PO TABS
75.0000 mg | ORAL_TABLET | Freq: Every day | ORAL | Status: DC
  Administered 2022-01-29 – 2022-01-30 (×2): 75 mg via ORAL
  Filled 2022-01-29 (×2): qty 1

## 2022-01-29 NOTE — Progress Notes (Signed)
MEDICATION RELATED CONSULT NOTE   Pharmacy Consult for BP Management (Avoid hypotension) Indication: post-stroke  Allergies  Allergen Reactions   Cortisone Other (See Comments)    Leg Cramps   Metformin Diarrhea    Patient Measurements: Height: '6\' 1"'$  (185.4 cm) Weight: 96.1 kg (211 lb 13.8 oz) IBW/kg (Calculated) : 79.9  Vital Signs: Temp: 97.9 F (36.6 C) (11/25 1215) BP: 167/58 (11/25 1215) Pulse Rate: 53 (11/25 1215)  Labs: Recent Labs    01/27/22 0549 01/28/22 0606 01/29/22 0531  WBC 7.2  --  8.0  HGB 11.2*  --  11.2*  HCT 32.5*  --  32.5*  PLT 100*  --  104*  CREATININE 2.38* 1.89* 1.79*   Estimated Creatinine Clearance: 36.2 mL/min (A) (by C-G formula based on SCr of 1.79 mg/dL (H)).  Microbiology: No results found for this or any previous visit (from the past 720 hour(s)).  Medical History: Past Medical History:  Diagnosis Date   (HFpEF) heart failure with preserved ejection fraction ([place])    a. 10/996 Echo: nl systolic fxn, mild LVH, diastolic relaxation abnormality, mildly enlarged LA, mild Ao insufficiency; 01/2018 Echo: EF 55-60%, no rwma, GR2 DD, triv AI, mildly dil LA, mild inc PASP.   AMS (altered mental status) 02/03/2021   BPH (benign prostatic hyperplasia)    Cancer ([place])    skin   Cataract    Choledocholithiasis    a. 05/2020 ERCP w/ CBD stone removal and biliary sphincterotomy.   Coronary artery disease    a. 01/2005 s/p CABG x 4: LIMA-LAD, VG-Diag, VG-OM3, VG-dRCA; b. 06/2013 Cath: patent grafts; c. 09/2015 Cath: VG->Diag 100, other grafts patent. LAD 100, RCA sev dzs->Med rx; d. 01/2018 Cath: LAD 100ost/p, LCX 50, RCA 100p/d, VG->OM3 100, VG->Diag 100, LIMA->LAD ok, VG->RPAV 68m->Med rx.   COVID-19    07/2020 had paxlovid    Dyspnea    with exertion   Dysrhythmia    ED (erectile dysfunction)    Elevated LFTs    a. 05/2020 following CBD stone. 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   GERD  (gastroesophageal reflux disease)    Hyperlipidemia    Hypertension    IDDM (insulin dependent diabetes mellitus) 2000   Osteoarthritis    [place]    a. 01/2018 RHC: PA 51/22 (32).   Perirectal fistula    Pneumonia 12/2016   Shingles 09/04/2018   Tubular adenoma of colon 07/2012   Vertigo     Medications:  Medications Prior to Admission  Medication Sig Dispense Refill Last Dose   amiodarone (PACERONE) 200 MG tablet Take 200 mg by mouth daily.   01/26/2022   apixaban (ELIQUIS) 5 MG TABS tablet Take 5 mg by mouth 2 (two) times daily.   01/26/2022   aspirin EC 81 MG tablet Take 81 mg by mouth every other day. In the morning   01/25/2022   carvedilol (COREG) 12.5 MG tablet TAKE ONE (1) TABLET BY MOUTH TWO TIMES PER DAY (Patient taking differently: Take 12.5 mg by mouth 2 (two) times daily with a meal.) 60 tablet 0 01/26/2022   Cholecalciferol (VITAMIN D3) 50 MCG (2000 UT) TABS Take 2,000 Units by mouth in the morning.   01/26/2022   dapagliflozin propanediol (FARXIGA) 10 MG TABS tablet Take 1 tablet (10 mg total) by mouth daily before breakfast. 90 tablet 1 01/26/2022   doxazosin (CARDURA) 2 MG tablet Take 1 tablet (2 mg total) by mouth 2 (two) times  daily. Please keep upcoming appt for future refills 180 tablet 0 01/26/2022   ezetimibe (ZETIA) 10 MG tablet TAKE 1 TABLET BY MOUTH DAILY 90 tablet 0 01/26/2022   finasteride (PROSCAR) 5 MG tablet Take 1 tablet (5 mg total) by mouth daily. (Patient taking differently: Take 5 mg by mouth every evening.) 90 tablet 1 01/25/2022   hydrALAZINE (APRESOLINE) 25 MG tablet Take 1 tablet (25 mg total) by mouth 3 (three) times daily. 270 tablet 3 01/26/2022   isosorbide mononitrate (IMDUR) 30 MG 24 hr tablet Take 1 tablet (30 mg total) by mouth daily. Please call [phone] for a sooner appointment and for further refills. 30 tablet 0 01/26/2022   Multiple Vitamins-Minerals (PRESERVISION AREDS 2 PO) Take 1 tablet by mouth  in the morning and at bedtime.   01/26/2022   rosuvastatin (CRESTOR) 20 MG tablet Take 1 tablet (20 mg total) by mouth daily. 90 tablet 3 01/25/2022   TOUJEO SOLOSTAR 300 UNIT/ML Solostar Pen Inject 40 Units into the skin daily. 22.5 mL 6 01/26/2022   valsartan (DIOVAN) 320 MG tablet Take 1 tablet (320 mg total) by mouth daily. At night d/c 160s mg 90 tablet 3 01/26/2022   BD PEN NEEDLE NANO U/F 32G X 4 MM MISC USE AS DIRECTED 100 each 0    [EXPIRED] cephALEXin (KEFLEX) 500 MG capsule Take 1 capsule (500 mg total) by mouth 3 (three) times daily for 10 days. (Patient not taking: Reported on 01/26/2022) 30 capsule 0 Not Taking   doxycycline (VIBRA-TABS) 100 MG tablet Take 1 tablet (100 mg total) by mouth 2 (two) times daily. With food x 10 days (Patient not taking: Reported on 01/26/2022) 20 tablet 0 Not Taking   gabapentin (NEURONTIN) 100 MG capsule Take 1 capsule (100 mg total) by mouth 3 (three) times daily. 90 capsule 1    HYDROcodone-acetaminophen (NORCO/VICODIN) 5-325 MG tablet Take 1 tablet by mouth every 6 (six) hours as needed for up to 5 doses for severe pain. (Patient not taking: Reported on 01/26/2022) 5 tablet 0 Not Taking   insulin glargine, 1 Unit Dial, (TOUJEO) 300 UNIT/ML Solostar Pen Inject 40 units under the skin daily 13.5 mL 3    lidocaine (LIDODERM) 5 % Place 1 patch onto the skin daily. Remove & Discard patch within 12 hours or as directed by MD (Patient not taking: Reported on 01/26/2022) 30 patch 0 Not Taking   loperamide (IMODIUM A-D) 2 MG tablet Take 1 tablet (2 mg total) by mouth 2 (two) times daily as needed for up to 60 doses for diarrhea or loose stools. (Patient taking differently: Take 2 mg by mouth in the morning.) 60 tablet 0 prn   mupirocin ointment (BACTROBAN) 2 % Apply 1 Application topically 2 (two) times daily. (Patient not taking: Reported on 01/26/2022) 30 g 0 Not Taking   omeprazole (PRILOSEC) 20 MG capsule Take 1 capsule (20 mg total) by mouth 2 (two) times  daily. 60 capsule 1    potassium chloride (KLOR-CON) 10 MEQ tablet Take 1 tablet (10 mEq total) by mouth daily as needed. Take with torsemide 90 tablet 3 prn   torsemide (DEMADEX) 20 MG tablet FOR WEIGHT 210-215 TAKE 1 TABLET WITH POTASSIUM  FOR WEIGHT GREATER THAN 215 TAKE 1 TABLET TWICE A DAY (8AM & 2PM) WITH 2 POTASSIUM 200 tablet 0 prn    Assessment: Patent admitted for acute ischemic stroke. Per neurology okay to resume antihypertensive therapy "Neurology recommended gently lowered BP to normotension with strict avoidance of hypotension." Dr  Pahwani consulted pharmacy to resume PTA therapy as need to manage hypertension but avoid HYPO tension.  Plan:  Keep PRN hydralazine Start irbesartan '75mg'$  daily (PTA valsartan) Will follow up daily BP and adjust therapy as needed.  Summers Buendia Rodriguez-Guzman PharmD, BCPS 01/29/2022 2:02 PM

## 2022-01-29 NOTE — Progress Notes (Signed)
Mobility Specialist - Progress Note    01/29/22 1600  Mobility  Activity Ambulated with assistance in hallway;Stood at bedside  Level of Assistance Standby assist, set-up cues, supervision of patient - no hands on  Assistive Device Front wheel walker  Distance Ambulated (ft) 200 ft  Activity Response Tolerated well  Mobility Referral Yes  $Mobility charge 1 Mobility   Pt resting in recline on RA upon entry. Pt STS and ambulates SBA in hallway around NS. Pt returned to recliner and left with needs in reach.   Loma Sender Mobility Specialist 01/29/22, 4:44 PM

## 2022-01-29 NOTE — Progress Notes (Signed)
PROGRESS NOTE    [name] [name]  ZOX:[id number] DOB: 08/04/1933 DOA: 01/26/2022 PCP: [name], MD   Brief Narrative:  88 year old male with history of permanent A-fib, coronary artery disease status post CABG, heart failure with preserved ejection fraction, type 2 diabetes, CKD stage IIIb, hypertension who presented with complaint of left leg weakness, slurred speech from home.  On presentation ,he was hemodynamically stable.  Lab work showed creatinine of 2.6.   Code Stroke was called.  CT head without any acute findings.  MRI of the brain showed acute infarct on right caudate head. Neuro consulted and admitted for further work-up  Assessment & Plan:   Acute ischemic stroke: -MRI showed acute infarct of the right caudate head> with a small amount of possible hemorrhage, repeat CT head at 6 hours no frank blood. CT angiogram did not show any LVO  but showed bilateral P2 , V4, right M2 stenosis.  -Cont aspirin, Zetia and Crestor.  -LDL controlled 33. A1c-pending -  Echocardiogram pending.  Continue PT OT> plan for [facility] -Per neurology permissive hypertension x48 hours, okay for chemical DVT prophylaxis, continue holding Eliquis and bridge with aspirin 81 mg daily with plan to repeat CT head at Hospital day 5 and if no hemorrhagic conversion consider resuming Eliquis   Aki on CKD stage IIIb:  -Baseline creatinine from 1.5-1.7.  AKI improved w/ ivf.  Encourage oral intake  Chronic A-fib: Rate controlled Eliquis on hold per neurology.  Continue amiodarone, Coreg on hold to allow permissive hypertension.     Hypertension: PTA on Carvedilol, Cardura, hydralazine, Imdur, valsartan -Neurology recommended gently lowered BP to normotension with strict avoidance of hypotension.   -cont. Hydralazine as needed and resumed half dose of valsartan for now.  Will continue to monitor blood pressure closely.  Chronic diastolic CHF:  -Torsemide is on hold due to AKI.  Continue to hold Farxiga at  this time.  Strict INO's and daily weight.  Monitor signs for fluid overload.  Hyperkalemia resolved   CAD: S/P cabg: No chest pain, continue aspirin, Crestor, Zetia    Diabetes type 2: A1c pending.   -Continue sliding scale insulin and i Semglee 8 units at bedtime.  Continue to monitor blood sugar closely.  Right hand crush injury: Had a superficial laceration of hand about 2 weeks ago after a tire landed on his hand.  No foul smell or discharge.  Patient on empiric Keflex wound care consulted   Hypercholesteremia: Continue Zetia and rosuvastatin  GERD: Continue PPI  DVT prophylaxis: Lovenox Code Status: Full code Family Communication:  None present at bedside.  Plan of care discussed with patient in length and he verbalized understanding and agreed with it. Disposition Plan: To be determined  Consultants:  Neurology  Procedures:  None  Antimicrobials:  Keflex  Status is: Inpatient    Subjective: Patient seen and examined.  Sitting comfortably on recliner.  Reports that overall he is doing fine, he denies any new complaints today.  No acute events overnight.  Objective: Vitals:   01/29/22 0019 01/29/22 0147 01/29/22 0437 01/29/22 0805  BP: (!) 139/40  (!) 142/57 (!) 150/59  Pulse: (!) 57  68 61  Resp: '20  18 16  '$ Temp: 98.2 F (36.8 C)  98.5 F (36.9 C) 98.1 F (36.7 C)  TempSrc:      SpO2: 98%  96% 97%  Weight:  96.1 kg    Height:        Intake/Output Summary (Last 24 hours) at 01/29/2022  Letts filed at 01/29/2022 1017 Gross per 24 hour  Intake 0 ml  Output --  Net 0 ml   Filed Weights   01/26/22 1131 01/29/22 0147  Weight: 100.5 kg 96.1 kg    Examination:  General exam: Appears calm and comfortable, on room air, communicating well. Respiratory system: Clear to auscultation. Respiratory effort normal. Cardiovascular system: S1 & S2 heard, RRR. No JVD, murmurs, rubs, gallops or clicks. No pedal edema. Gastrointestinal system: Abdomen is  nondistended, soft and nontender. No organomegaly or masses felt. Normal bowel sounds heard. Central nervous system: Alert and oriented.  Power 5 out of 5 in all 4 extremities. Skin: No rashes, lesions or ulcers Psychiatry: Judgement and insight appear normal. Mood & affect appropriate.    Data Reviewed: I have personally reviewed following labs and imaging studies  CBC: Recent Labs  Lab 01/26/22 1138 01/27/22 0549 01/29/22 0531  WBC 6.5 7.2 8.0  NEUTROABS 5.0  --   --   HGB 12.1* 11.2* 11.2*  HCT 34.9* 32.5* 32.5*  MCV 90.9 90.8 91.5  PLT 97* 100* 329*   Basic Metabolic Panel: Recent Labs  Lab 01/26/22 1138 01/26/22 1742 01/27/22 0549 01/28/22 0606 01/29/22 0531  NA 138 137 141 145 140  K 5.4* 5.4* 4.4 4.1 4.2  CL 111 112* 116* 116* 115*  CO2 21* 20* 22 23 21*  GLUCOSE 278* 217* 112* 108* 173*  BUN 51* 49* 44* 36* 27*  CREATININE 2.69* 2.52* 2.38* 1.89* 1.79*  CALCIUM 8.4* 8.3* 8.3* 8.2* 8.2*   GFR: Estimated Creatinine Clearance: 36.2 mL/min (A) (by C-G formula based on SCr of 1.79 mg/dL (H)). Liver Function Tests: Recent Labs  Lab 01/26/22 1138  AST 25  ALT 18  ALKPHOS 108  BILITOT 1.0  PROT 5.9*  ALBUMIN 3.3*   No results for input(s): "LIPASE", "AMYLASE" in the last 168 hours. No results for input(s): "AMMONIA" in the last 168 hours. Coagulation Profile: Recent Labs  Lab 01/26/22 1138  INR 1.6*   Cardiac Enzymes: No results for input(s): "CKTOTAL", "CKMB", "CKMBINDEX", "TROPONINI" in the last 168 hours. BNP (last 3 results) No results for input(s): "PROBNP" in the last 8760 hours. HbA1C: No results for input(s): "HGBA1C" in the last 72 hours. CBG: Recent Labs  Lab 01/28/22 0732 01/28/22 1141 01/28/22 1625 01/28/22 2044 01/29/22 0802  GLUCAP 196* 248* 108* 178* 141*   Lipid Profile: No results for input(s): "CHOL", "HDL", "LDLCALC", "TRIG", "CHOLHDL", "LDLDIRECT" in the last 72 hours. Thyroid Function Tests: No results for input(s):  "TSH", "T4TOTAL", "FREET4", "T3FREE", "THYROIDAB" in the last 72 hours. Anemia Panel: No results for input(s): "VITAMINB12", "FOLATE", "FERRITIN", "TIBC", "IRON", "RETICCTPCT" in the last 72 hours. Sepsis Labs: No results for input(s): "PROCALCITON", "LATICACIDVEN" in the last 168 hours.  No results found for this or any previous visit (from the past 240 hour(s)).    Radiology Studies: No results found.  Scheduled Meds:  amiodarone  200 mg Oral Daily   aspirin EC  81 mg Oral Daily   cephALEXin  500 mg Oral Q8H   enoxaparin (LOVENOX) injection  40 mg Subcutaneous Q24H   ezetimibe  10 mg Oral Daily   finasteride  5 mg Oral QPM   gabapentin  100 mg Oral TID   insulin aspart  0-15 Units Subcutaneous TID WC   insulin glargine-yfgn  20 Units Subcutaneous QHS   multivitamin with minerals  1 tablet Oral Daily   pantoprazole  40 mg Oral Daily   rosuvastatin  20 mg Oral Daily   sodium chloride flush  3 mL Intravenous Q12H   Continuous Infusions:   LOS: 2 days   Time spent: 35 minutes   Lasharn Bufkin Loann Quill, MD [facility]  If 7PM-7AM, please contact night-coverage [email or web address] 01/29/2022, 11:44 AM

## 2022-01-29 NOTE — Plan of Care (Signed)
Neurology plan of care  Neurology will f/u with patient hospital day 5 prior to restarting anticoagulation. Please reach out if any new neurologic concerns develop in the meantime.  In the meantime please change BP goal - gently lower BP to normotension with strict avoidance of hypotension  TTE still pending results but was performed 11/22 - please f/u with cardiology regarding results  Su Monks, MD [facility] [phone]  If Aure[place], please page neurology on call as listed in [place].

## 2022-01-29 NOTE — Plan of Care (Signed)
  Problem: Education: Goal: Knowledge of disease or condition will improve Outcome: Progressing   

## 2022-01-30 LAB — GLUCOSE, CAPILLARY
Glucose-Capillary: 84 mg/dL (ref 70–99)
Glucose-Capillary: 183 mg/dL — ABNORMAL HIGH (ref 70–99)
Glucose-Capillary: 231 mg/dL — ABNORMAL HIGH (ref 70–99)
Glucose-Capillary: 181 mg/dL — ABNORMAL HIGH (ref 70–99)

## 2022-01-30 LAB — CBC
HCT: 32.8 % — ABNORMAL LOW (ref 39.0–52.0)
Hemoglobin: 11.5 g/dL — ABNORMAL LOW (ref 13.0–17.0)
MCH: 31.8 pg (ref 26.0–34.0)
MCHC: 35.1 g/dL (ref 30.0–36.0)
MCV: 90.6 fL (ref 80.0–100.0)
Platelets: 95 10*3/uL — ABNORMAL LOW (ref 150–400)
RBC: 3.62 MIL/uL — ABNORMAL LOW (ref 4.22–5.81)
RDW: 13.1 % (ref 11.5–15.5)
WBC: 8.8 10*3/uL (ref 4.0–10.5)
nRBC: 0 % (ref 0.0–0.2)

## 2022-01-30 LAB — ECHOCARDIOGRAM COMPLETE
Area-P 1/2: 2.8 cm2
Height: 73 in
S' Lateral: 3.7 cm
Weight: 3545 oz

## 2022-01-30 LAB — BASIC METABOLIC PANEL
Anion gap: 3 — ABNORMAL LOW (ref 5–15)
BUN: 22 mg/dL (ref 8–23)
CO2: 22 mmol/L (ref 22–32)
Calcium: 8.5 mg/dL — ABNORMAL LOW (ref 8.9–10.3)
Chloride: 116 mmol/L — ABNORMAL HIGH (ref 98–111)
Creatinine, Ser: 1.66 mg/dL — ABNORMAL HIGH (ref 0.61–1.24)
GFR, Estimated: 40 mL/min — ABNORMAL LOW (ref >60)
Glucose, Bld: 79 mg/dL (ref 70–99)
Potassium: 4.2 mmol/L (ref 3.5–5.1)
Sodium: 141 mmol/L (ref 135–145)

## 2022-01-30 MED ORDER — CARVEDILOL 6.25 MG PO TABS
12.5000 mg | ORAL_TABLET | Freq: Two times a day (BID) | ORAL | Status: DC
  Administered 2022-01-30 – 2022-01-31 (×2): 12.5 mg via ORAL
  Filled 2022-01-30 (×2): qty 2

## 2022-01-30 MED ORDER — GUAIFENESIN 100 MG/5ML PO LIQD
5.0000 mL | ORAL | Status: DC | PRN
  Administered 2022-01-30 – 2022-01-31 (×3): 5 mL via ORAL
  Filled 2022-01-30 (×3): qty 10

## 2022-01-30 MED ORDER — PHENOL 1.4 % MT LIQD: 1.0000 | OROMUCOSAL | Status: DC | PRN

## 2022-01-30 MED ORDER — MENTHOL 3 MG MT LOZG
1.0000 | LOZENGE | OROMUCOSAL | Status: DC | PRN
  Administered 2022-01-30 – 2022-01-31 (×2): 3 mg via ORAL
  Filled 2022-01-30: qty 9

## 2022-01-30 MED ORDER — IRBESARTAN 150 MG PO TABS
300.0000 mg | ORAL_TABLET | Freq: Every day | ORAL | Status: DC
  Administered 2022-01-30 – 2022-01-31 (×2): 300 mg via ORAL
  Filled 2022-01-30 (×2): qty 2

## 2022-01-30 MED ORDER — ISOSORBIDE MONONITRATE ER 30 MG PO TB24
30.0000 mg | ORAL_TABLET | Freq: Every day | ORAL | Status: DC
  Administered 2022-01-30 – 2022-01-31 (×2): 30 mg via ORAL
  Filled 2022-01-30 (×2): qty 1

## 2022-01-30 NOTE — Plan of Care (Signed)
  Problem: Education: Goal: Knowledge of disease or condition will improve Outcome: Progressing   

## 2022-01-30 NOTE — Progress Notes (Signed)
SLP Cancellation Note  Patient Details Name: [name] [name] MRN: [id number] DOB: 1933-04-19   Cancelled treatment:       Reason Eval/Treat Not Completed: SLP screened, no needs identified, will sign off   SLP consult received and appreciated. Chart review completed. Per chart review and discussion with RN, pt A&Ox4 and speech is fluent and clear. No concerns for speech/language/cognition per RN.   Cherrie Gauze, M.S., [facility] [phone] ([place])  Quintella Baton 01/30/2022, 1:55 PM

## 2022-01-30 NOTE — Progress Notes (Addendum)
PROGRESS NOTE    [name] [name]  LFY:[id number] DOB: [birth year] DOA: 01/26/2022 PCP: [name], MD   Brief Narrative:  88 year old male with history of permanent A-fib, coronary artery disease status post CABG, heart failure with preserved ejection fraction, type 2 diabetes, CKD stage IIIb, hypertension who presented with complaint of left leg weakness, slurred speech from home.  On presentation ,he was hemodynamically stable.  Lab work showed creatinine of 2.6.   Code Stroke was called.  CT head without any acute findings.  MRI of the brain showed acute infarct on right caudate head. Neuro consulted and admitted for further work-up  Assessment & Plan:   Acute ischemic stroke: -MRI showed acute infarct of the right caudate head> with a small amount of possible hemorrhage, repeat CT head at 6 hours no frank blood. CT angiogram did not show any LVO  but showed bilateral P2 , V4, right M2 stenosis.  -Cont aspirin, Zetia and Crestor.  -LDL controlled 33. A1c-pending -  Echocardiogram still pending (secure chat sent to cardiology).  Continue PT OT> plan for [facility] -Per neurology gently lowered BP to normotension, okay for chemical DVT prophylaxis, continue holding Eliquis and bridge with aspirin 81 mg daily with plan to repeat CT head at Hospital day 5 (01/31/22) and if no hemorrhagic conversion consider resuming Eliquis   Aki on CKD stage IIIb:  -Baseline creatinine from 1.5-1.7.  AKI improved w/ ivf.  Encourage oral intake  Chronic A-fib: Rate controlled Eliquis on hold per neurology.  Continue amiodarone, resume Coreg  Hypertension: PTA on Carvedilol, Cardura, hydralazine, Imdur, valsartan -Neurology recommended gently lower BP to normotension with strict avoidance of hypotension.   -cont. Hydralazine as needed resume Coreg, Imdur and valsartan.  Will continue to monitor blood pressure closely  Chronic diastolic CHF:  -Hold torsemide and Farxiga. Strict INO's and daily weight.   Monitor signs for fluid overload.  Hyperkalemia resolved   CAD: S/P cabg: No chest pain, continue aspirin, Crestor, Zetia    Diabetes type 2: A1c pending.   -Continue sliding scale insulin and i Semglee 8 units at bedtime.  Continue to monitor blood sugar closely.  Right hand crush injury: Had a superficial laceration of hand about 2 weeks ago after a tire landed on his hand.  No foul smell or discharge.  Patient on empiric Keflex wound care consulted   Hypercholesteremia: Continue Zetia and rosuvastatin  GERD: Continue PPI  Normocytic anemia: H&H is currently at baseline.  Will continue to monitor  Thrombocytopenia: Chronic.  At baseline.  DVT prophylaxis: Lovenox Code Status: Full code Family Communication:  None present at bedside.  Plan of care discussed with patient in length and he verbalized understanding and agreed with it. Disposition Plan: Home with home health services Consultants:  Neurology  Procedures:  None  Antimicrobials:  Keflex  Status is: Inpatient    Subjective: Patient seen and examined.  Sitting comfortably on recliner.  Denies any new complaints.  He had cough yesterday for which Tessalon Perles were added.  No fever, chills, sputum production.  No headache, blurry vision, weakness, numbness tingling sensation in hands or feet.  No acute events overnight.  Objective: Vitals:   01/30/22 0832 01/30/22 0836 01/30/22 0926 01/30/22 0930  BP: (!) 199/71 (!) 159/97 (!) 248/108 (!) 185/66  Pulse: (!) 58 (!) 52 65   Resp: 20     Temp: 98.2 F (36.8 C)     TempSrc: Oral     SpO2: 98%  Weight:      Height:        Intake/Output Summary (Last 24 hours) at 01/30/2022 1030 Last data filed at 01/29/2022 1851 Gross per 24 hour  Intake 440 ml  Output --  Net 440 ml    Filed Weights   01/26/22 1131 01/29/22 0147 01/30/22 0500  Weight: 100.5 kg 96.1 kg 96.1 kg    Examination:  General exam: Appears calm and comfortable, on room air,  communicating well. Respiratory system: Clear to auscultation. Respiratory effort normal. Cardiovascular system: S1 & S2 heard, RRR. No JVD, murmurs, rubs, gallops or clicks. No pedal edema. Gastrointestinal system: Abdomen is nondistended, soft and nontender. No organomegaly or masses felt. Normal bowel sounds heard. Central nervous system: Alert and oriented.  Power 5 out of 5 in all 4 extremities. Skin: No rashes, lesions or ulcers Psychiatry: Judgement and insight appear normal. Mood & affect appropriate.    Data Reviewed: I have personally reviewed following labs and imaging studies  CBC: Recent Labs  Lab 01/26/22 1138 01/27/22 0549 01/29/22 0531 01/30/22 0519  WBC 6.5 7.2 8.0 8.8  NEUTROABS 5.0  --   --   --   HGB 12.1* 11.2* 11.2* 11.5*  HCT 34.9* 32.5* 32.5* 32.8*  MCV 90.9 90.8 91.5 90.6  PLT 97* 100* 104* 95*    Basic Metabolic Panel: Recent Labs  Lab 01/26/22 1742 01/27/22 0549 01/28/22 0606 01/29/22 0531 01/30/22 0519  NA 137 141 145 140 141  K 5.4* 4.4 4.1 4.2 4.2  CL 112* 116* 116* 115* 116*  CO2 20* 22 23 21* 22  GLUCOSE 217* 112* 108* 173* 79  BUN 49* 44* 36* 27* 22  CREATININE 2.52* 2.38* 1.89* 1.79* 1.66*  CALCIUM 8.3* 8.3* 8.2* 8.2* 8.5*    GFR: Estimated Creatinine Clearance: 39 mL/min (A) (by C-G formula based on SCr of 1.66 mg/dL (H)). Liver Function Tests: Recent Labs  Lab 01/26/22 1138  AST 25  ALT 18  ALKPHOS 108  BILITOT 1.0  PROT 5.9*  ALBUMIN 3.3*    No results for input(s): "LIPASE", "AMYLASE" in the last 168 hours. No results for input(s): "AMMONIA" in the last 168 hours. Coagulation Profile: Recent Labs  Lab 01/26/22 1138  INR 1.6*    Cardiac Enzymes: No results for input(s): "CKTOTAL", "CKMB", "CKMBINDEX", "TROPONINI" in the last 168 hours. BNP (last 3 results) No results for input(s): "PROBNP" in the last 8760 hours. HbA1C: No results for input(s): "HGBA1C" in the last 72 hours. CBG: Recent Labs  Lab  01/29/22 0802 01/29/22 1212 01/29/22 1655 01/29/22 2050 01/30/22 0855  GLUCAP 141* 236* 109* 255* 84    Lipid Profile: No results for input(s): "CHOL", "HDL", "LDLCALC", "TRIG", "CHOLHDL", "LDLDIRECT" in the last 72 hours. Thyroid Function Tests: No results for input(s): "TSH", "T4TOTAL", "FREET4", "T3FREE", "THYROIDAB" in the last 72 hours. Anemia Panel: No results for input(s): "VITAMINB12", "FOLATE", "FERRITIN", "TIBC", "IRON", "RETICCTPCT" in the last 72 hours. Sepsis Labs: No results for input(s): "PROCALCITON", "LATICACIDVEN" in the last 168 hours.  No results found for this or any previous visit (from the past 240 hour(s)).    Radiology Studies: No results found.  Scheduled Meds:  amiodarone  200 mg Oral Daily   aspirin EC  81 mg Oral Daily   benzonatate  100 mg Oral TID   carvedilol  12.5 mg Oral BID WC   cephALEXin  500 mg Oral Q8H   enoxaparin (LOVENOX) injection  40 mg Subcutaneous Q24H   ezetimibe  10 mg Oral  Daily   finasteride  5 mg Oral QPM   gabapentin  100 mg Oral TID   insulin aspart  0-15 Units Subcutaneous TID WC   insulin glargine-yfgn  20 Units Subcutaneous QHS   irbesartan  300 mg Oral Daily   multivitamin with minerals  1 tablet Oral Daily   pantoprazole  40 mg Oral Daily   rosuvastatin  20 mg Oral Daily   sodium chloride flush  3 mL Intravenous Q12H   Continuous Infusions:   LOS: 3 days   Time spent: 35 minutes   Emmerie Battaglia Loann Quill, MD [facility]  If 7PM-7AM, please contact night-coverage [email or web address] 01/30/2022, 10:30 AM

## 2022-01-30 NOTE — Progress Notes (Signed)
PT Cancellation Note  Patient Details Name: [name] [name] MRN: [id number] DOB: 1933-06-17   Cancelled Treatment:    Reason Eval/Treat Not Completed: Patient not medically ready Chart reviewed. Pt noted to have ongoing elevated systolic BP. Will hold PT tx at this time.  Lavone Nian, PT, DPT 01/30/22, 10:29 AM   Waunita Schooner 01/30/2022, 10:29 AM

## 2022-01-31 ENCOUNTER — Inpatient Hospital Stay: Payer: Medicare Other

## 2022-01-31 LAB — CBC
HCT: 30 % — ABNORMAL LOW (ref 39.0–52.0)
Hemoglobin: 10.7 g/dL — ABNORMAL LOW (ref 13.0–17.0)
MCH: 32.3 pg (ref 26.0–34.0)
MCHC: 35.7 g/dL (ref 30.0–36.0)
MCV: 90.6 fL (ref 80.0–100.0)
Platelets: 90 10*3/uL — ABNORMAL LOW (ref 150–400)
RBC: 3.31 MIL/uL — ABNORMAL LOW (ref 4.22–5.81)
RDW: 13.2 % (ref 11.5–15.5)
WBC: 8.2 10*3/uL (ref 4.0–10.5)
nRBC: 0 % (ref 0.0–0.2)

## 2022-01-31 LAB — BASIC METABOLIC PANEL
Anion gap: 4 — ABNORMAL LOW (ref 5–15)
BUN: 21 mg/dL (ref 8–23)
CO2: 21 mmol/L — ABNORMAL LOW (ref 22–32)
Calcium: 8.6 mg/dL — ABNORMAL LOW (ref 8.9–10.3)
Chloride: 115 mmol/L — ABNORMAL HIGH (ref 98–111)
Creatinine, Ser: 1.79 mg/dL — ABNORMAL HIGH (ref 0.61–1.24)
GFR, Estimated: 36 mL/min — ABNORMAL LOW (ref >60)
Glucose, Bld: 156 mg/dL — ABNORMAL HIGH (ref 70–99)
Potassium: 4.3 mmol/L (ref 3.5–5.1)
Sodium: 140 mmol/L (ref 135–145)

## 2022-01-31 LAB — GLUCOSE, CAPILLARY
Glucose-Capillary: 164 mg/dL — ABNORMAL HIGH (ref 70–99)
Glucose-Capillary: 237 mg/dL — ABNORMAL HIGH (ref 70–99)

## 2022-01-31 MED ORDER — CEPHALEXIN 500 MG PO CAPS: 500.0000 mg | ORAL_CAPSULE | Freq: Three times a day (TID) | ORAL | 0 refills | Status: AC

## 2022-01-31 NOTE — Care Management Important Message (Signed)
Important Message  Patient Details  Name: [name] [name] MRN: [id number] Date of Birth: 04-08-1933   Medicare Important Message Given:  Yes     Juliann Pulse A Livier Hendel 01/31/2022, 11:38 AM

## 2022-01-31 NOTE — Progress Notes (Signed)
Patient being discharged home. PIV removed. Went over discharge instructions and medication with patient. Patient states that he understands. Patient going home POV.

## 2022-01-31 NOTE — Discharge Summary (Signed)
Physician Discharge Summary  [name] [name] PFX:[id number] DOB: Nov 07, 1933 DOA: 01/26/2022  PCP: [name] Haven, MD  Admit date: 01/26/2022 Discharge date: 01/31/2022  Admitted From: Home Disposition:  Home  Discharge Condition:Stable CODE STATUS:FULL Diet recommendation: Heart Healthy   Brief/Interim Summary: Patient is a 88 year old male with history of permanent A-fib, coronary artery disease status post CABG, heart failure with preserved ejection fraction, type 2 diabetes, CKD stage IIIb, hypertension who presented with complaint of left leg weakness, slurred speech from home.  On presentation ,he was hemodynamically stable.  Lab work showed creatinine of 2.6.   Code Stroke was called.  CT head without any acute findings.  MRI of the brain showed acute infarct on right caudate head.  Neurology consulted.  Started stroke work-up,now completed.  PT/OT recommends home health on discharge.  Medically stable for discharge today.  Following problems were addressed during her hospitalization:  Acute CVA: Presented with sudden onset of left-sided weakness, slurred speech.  MRI showed acute infarct of the right caudate head.  CT angiogram did not show any LVO  but showed bilateral P2 , V4, right M2 stenosis.  Neurology were following. Follow-up CT head did not show any hemorrhagic conversion. Echo showed EF of 60 to 65%, no source of emboli LDL of 33.  A1c still pending. PT/OT recommending health on discharge.Marland Kitchen He does not have any focal neurological deficits today.  Speech is clear.  He is completely alert and oriented. He is already on Zetia, Crestor from home. Neurology did not recommend any change in his medication.  He will continue Eliquis.  Neurology cleared that he does not need aspirin at discharge, discussed with Dr Rory Percy. Should follow-up with neurology in 4 weeks   CKD stage IIIb: Baseline creatinine from 1.5-1.7.  Presented with creatinine of 2.6.Improved,now at baseline   . Will recommend follow-up with outpatient nephrology   Chronic A-fib: Remains in A-fib with controlled rate.  On Eliquis for anticoagulation which is on hold.  Also on carvedilol, amiodarone at home.     Hypertension: Takes carvedilol, Cardura, hydralazine, Imdur, valsartan at home.  These medicines will be continued..  Currently blood pressure stable   Chronic diastolic CHF: Takes torsemide as needed at home.  Resume   History of coronary artery disease: History of coronary artery disease status post CABG.  Takes aspirin, Crestor, Zetia at home   Diabetes type 2: A1c still pending.  Continue home regimen on dc  Right hand crush injury: Had a superficial laceration of hand about 2 weeks ago after a tire landed on his hand.  No significant discharge or foul smell.  Continue Keflex for now to finish the course.  Wound care was consulted.          Discharge Diagnoses:  Principal Problem:   Acute CVA (cerebrovascular accident) [facility]) Active Problems:   Acute kidney injury superimposed on chronic kidney disease ([place])   Atrial fibrillation, chronic (HCC)   Essential hypertension   Chronic diastolic CHF (congestive heart failure) (HCC)   CAD (coronary artery disease)   Type 2 diabetes mellitus with complication, without long-term current use of insulin (HCC)   Thrombocytopenia ([place])   Hand crush injury, right, sequela   Stroke [facility])    Discharge Instructions  Discharge Instructions     Ambulatory referral to Nephrology   Complete by: As directed    Ambulatory referral to Neurology   Complete by: As directed    An appointment is requested in approximately: 4 weeks   Diet -  low sodium heart healthy   Complete by: As directed    Discharge instructions   Complete by: As directed    1)Please take your medications as instructed 2)Follow up with your PCP in a week.  Check a BMP test to check your kidney function during the follow-up 3)Follow up with neurology in 4 weeks 4)Follow  up with nephrology through the referral of PCP.   Discharge wound care:   Complete by: As directed    As per wound care nurse   Increase activity slowly   Complete by: As directed       Allergies as of 01/31/2022       Reactions   Cortisone Other (See Comments)   Leg Cramps   Metformin Diarrhea        Medication List     STOP taking these medications    aspirin EC 81 MG tablet   doxycycline 100 MG tablet Commonly known as: VIBRA-TABS   HYDROcodone-acetaminophen 5-325 MG tablet Commonly known as: NORCO/VICODIN   lidocaine 5 % Commonly known as: Lidoderm   mupirocin ointment 2 % Commonly known as: BACTROBAN       TAKE these medications    amiodarone 200 MG tablet Commonly known as: PACERONE Take 200 mg by mouth daily.   BD Pen Needle Nano U/F 32G X 4 MM Misc Generic drug: Insulin Pen Needle USE AS DIRECTED   carvedilol 12.5 MG tablet Commonly known as: COREG TAKE ONE (1) TABLET BY MOUTH TWO TIMES PER DAY What changed: See the new instructions.   cephALEXin 500 MG capsule Commonly known as: KEFLEX Take 1 capsule (500 mg total) by mouth every 8 (eight) hours for 2 days. What changed: when to take this   dapagliflozin propanediol 10 MG Tabs tablet Commonly known as: Farxiga Take 1 tablet (10 mg total) by mouth daily before breakfast.   doxazosin 2 MG tablet Commonly known as: CARDURA Take 1 tablet (2 mg total) by mouth 2 (two) times daily. Please keep upcoming appt for future refills   Eliquis 5 MG Tabs tablet Generic drug: apixaban Take 5 mg by mouth 2 (two) times daily.   ezetimibe 10 MG tablet Commonly known as: ZETIA TAKE 1 TABLET BY MOUTH DAILY   finasteride 5 MG tablet Commonly known as: PROSCAR Take 1 tablet (5 mg total) by mouth daily. What changed: when to take this   gabapentin 100 MG capsule Commonly known as: NEURONTIN Take 1 capsule (100 mg total) by mouth 3 (three) times daily.   hydrALAZINE 25 MG tablet Commonly known  as: APRESOLINE Take 1 tablet (25 mg total) by mouth 3 (three) times daily.   isosorbide mononitrate 30 MG 24 hr tablet Commonly known as: IMDUR Take 1 tablet (30 mg total) by mouth daily. Please call [phone] for a sooner appointment and for further refills.   loperamide 2 MG tablet Commonly known as: Imodium A-D Take 1 tablet (2 mg total) by mouth 2 (two) times daily as needed for up to 60 doses for diarrhea or loose stools. What changed: when to take this   omeprazole 20 MG capsule Commonly known as: PRILOSEC Take 1 capsule (20 mg total) by mouth 2 (two) times daily. What changed: when to take this   potassium chloride 10 MEQ tablet Commonly known as: KLOR-CON Take 1 tablet (10 mEq total) by mouth daily as needed. Take with torsemide   PRESERVISION AREDS 2 PO Take 1 tablet by mouth in the morning and at bedtime.   rosuvastatin  20 MG tablet Commonly known as: CRESTOR Take 1 tablet (20 mg total) by mouth daily.   torsemide 20 MG tablet Commonly known as: DEMADEX FOR WEIGHT 210-215 TAKE 1 TABLET WITH POTASSIUM  FOR WEIGHT GREATER THAN 215 TAKE 1 TABLET TWICE A DAY (8AM & 2PM) WITH 2 POTASSIUM   Toujeo SoloStar 300 UNIT/ML Solostar Pen Generic drug: insulin glargine (1 Unit Dial) Inject 40 Units into the skin daily.   Toujeo SoloStar 300 UNIT/ML Solostar Pen Generic drug: insulin glargine (1 Unit Dial) Inject 40 units under the skin daily   valsartan 320 MG tablet Commonly known as: DIOVAN Take 1 tablet (320 mg total) by mouth daily. At night d/c 160s mg   Vitamin D3 50 MCG (2000 UT) Tabs Take 2,000 Units by mouth in the morning.               Durable Medical Equipment  (From admission, onward)           Start     Ordered   01/28/22 1423  For home use only DME 3 n 1  Once        01/28/22 1422              Discharge Care Instructions  (From admission, onward)           Start     Ordered   01/31/22 0000  Discharge wound care:        Comments: As per wound care nurse   01/31/22 1021            Allergies  Allergen Reactions   Cortisone Other (See Comments)    Leg Cramps   Metformin Diarrhea    Consultations: Neurology   Procedures/Studies: CT HEAD WO CONTRAST (5MM)  Result Date: 01/31/2022 CLINICAL DATA:  Recent code stroke evaluation. Right basal ganglia infarction. Follow-up prior to anticoagulation. EXAM: CT HEAD WITHOUT CONTRAST TECHNIQUE: Contiguous axial images were obtained from the base of the skull through the vertex without intravenous contrast. RADIATION DOSE REDUCTION: This exam was performed according to the departmental dose-optimization program which includes automated exposure control, adjustment of the mA and/or kV according to patient size and/or use of iterative reconstruction technique. COMPARISON:  CT and MRI 01/26/2022 FINDINGS: Brain: No new abnormality. No evidence of hemorrhagic complication. No focal abnormality seen affecting the brainstem or cerebellum. Cerebral hemispheres show age related volume loss, chronic small-vessel ischemic changes of the white matter and subacute focal infarction in the anterior right caudate as seen previously. No sign of hemorrhagic transformation. No mass, hydrocephalus or extra-axial collection. Vascular: There is atherosclerotic calcification of the major vessels at the base of the brain. Skull: Negative Sinuses/Orbits: Clear maxillary, ethmoid and frontal sinuses. Inflammatory changes of the sphenoid sinuses. Other: None IMPRESSION: No new insult. No evidence of hemorrhagic transformation of the recent right caudate head infarction. Chronic small-vessel ischemic changes of the white matter. Sphenoid sinus inflammatory changes as seen previously. Electronically Signed   By: Nelson Chimes M.D.   On: 01/31/2022 09:09   ECHOCARDIOGRAM COMPLETE  Result Date: 01/30/2022    ECHOCARDIOGRAM REPORT   Patient Name:   [name] [name] Date of Exam: 01/26/2022 Medical  Rec #:  [id number]        Height:       73.0 in Accession #:    [id number]       Weight:       221.6 lb Date of Birth:  [birth year]        BSA:  2.247 m Patient Age:    5 years         BP:           126/58 mmHg Patient Gender: M                HR:           50 bpm. Exam Location:  [facility] Procedure: 2D Echo, Cardiac Doppler and Color Doppler Indications:     I63.9 Stroke  History:         Patient has prior history of Echocardiogram examinations, most                  recent 12/09/2020. CAD, Signs/Symptoms:Dyspnea; Risk                  Factors:Hypertension, Diabetes and Dyslipidemia. COVID-19.  Sonographer:     Cresenciano Lick RDCS Referring Phys:  [place] Diagnosing Phys: Kate Sable MD IMPRESSIONS  1. Left ventricular ejection fraction, by estimation, is 60 to 65%. The left ventricle has normal function. The left ventricle has no regional wall motion abnormalities. Left ventricular diastolic parameters were normal.  2. Right ventricular systolic function is normal. The right ventricular size is normal.  3. The mitral valve is normal in structure. Mild mitral valve regurgitation.  4. Tricuspid valve regurgitation is mild to moderate.  5. The aortic valve is tricuspid. Aortic valve regurgitation is mild. Aortic valve sclerosis/calcification is present, without any evidence of aortic stenosis.  6. Aortic dilatation noted. There is mild dilatation of the ascending aorta, measuring 40 mm.  7. The inferior vena cava is normal in size with greater than 50% respiratory variability, suggesting right atrial pressure of 3 mmHg. FINDINGS  Left Ventricle: Left ventricular ejection fraction, by estimation, is 60 to 65%. The left ventricle has normal function. The left ventricle has no regional wall motion abnormalities. The left ventricular internal cavity size was normal in size. There is  no left ventricular hypertrophy. Left ventricular diastolic parameters were normal. Right Ventricle: The  right ventricular size is normal. No increase in right ventricular wall thickness. Right ventricular systolic function is normal. Left Atrium: Left atrial size was normal in size. Right Atrium: Right atrial size was normal in size. Pericardium: There is no evidence of pericardial effusion. Mitral Valve: The mitral valve is normal in structure. Mild mitral valve regurgitation. Tricuspid Valve: The tricuspid valve is normal in structure. Tricuspid valve regurgitation is mild to moderate. Aortic Valve: The aortic valve is tricuspid. Aortic valve regurgitation is mild. Aortic valve sclerosis/calcification is present, without any evidence of aortic stenosis. Pulmonic Valve: The pulmonic valve was normal in structure. Pulmonic valve regurgitation is mild. Aorta: Aortic dilatation noted. There is mild dilatation of the ascending aorta, measuring 40 mm. Venous: The inferior vena cava is normal in size with greater than 50% respiratory variability, suggesting right atrial pressure of 3 mmHg. IAS/Shunts: No atrial level shunt detected by color flow Doppler.  LEFT VENTRICLE PLAX 2D LVIDd:         5.20 cm   Diastology LVIDs:         3.70 cm   LV e' medial:    6.74 cm/s LV PW:         0.80 cm   LV E/e' medial:  13.1 LV IVS:        0.80 cm   LV e' lateral:   10.40 cm/s LVOT diam:     2.30 cm   LV E/e' lateral:  8.5 LV SV:         97 LV SV Index:   43 LVOT Area:     4.15 cm  RIGHT VENTRICLE RV Basal diam:  3.80 cm RV S prime:     9.70 cm/s TAPSE (M-mode): 1.8 cm LEFT ATRIUM             Index        RIGHT ATRIUM           Index LA diam:        5.40 cm 2.40 cm/m   RA Area:     15.60 cm LA Vol (A2C):   57.5 ml 25.59 ml/m  RA Volume:   40.10 ml  17.84 ml/m LA Vol (A4C):   64.8 ml 28.83 ml/m LA Biplane Vol: 61.5 ml 27.37 ml/m  AORTIC VALVE LVOT Vmax:   103.50 cm/s LVOT Vmean:  63.150 cm/s LVOT VTI:    0.234 m  AORTA Ao Root diam: 3.70 cm Ao Asc diam:  4.00 cm MITRAL VALVE MV Area (PHT): 2.80 cm    SHUNTS MV Decel Time: 271 msec     Systemic VTI:  0.23 m MV E velocity: 88.60 cm/s  Systemic Diam: 2.30 cm MV A velocity: 81.80 cm/s MV E/A ratio:  1.08 Kate Sable MD Electronically signed by Kate Sable MD Signature Date/Time: 01/30/2022/4:37:05 PM    Final    CT HEAD WO CONTRAST (5MM)  Result Date: 01/27/2022 CLINICAL DATA:  Sudden onset left arm and leg numbness and dysarthria EXAM: CT HEAD WITHOUT CONTRAST TECHNIQUE: Contiguous axial images were obtained from the base of the skull through the vertex without intravenous contrast. RADIATION DOSE REDUCTION: This exam was performed according to the departmental dose-optimization program which includes automated exposure control, adjustment of the mA and/or kV according to patient size and/or use of iterative reconstruction technique. COMPARISON:  01/26/2022 CT head FINDINGS: Brain: Hypodensity in the right caudate head (series 2, image 14), which correlates with the infarct seen on the same-day MRI. No evidence of additional acute infarct. No evidence of acute hemorrhage, mass, mass effect, or midline shift. No hydrocephalus or extra-axial fluid collection. Vascular: No hyperdense vessel. Atherosclerotic calcifications in the intracranial carotid and vertebral arteries. Skull: Normal. Negative for fracture or focal lesion. Sinuses/Orbits: Complete opacification of the left sphenoid sinus. Mucosal thickening in the right sphenoid sinus and ethmoid air cells. Status post bilateral lens replacements. Other: The mastoid air cells are well aerated. IMPRESSION: Hypodensity in the right caudate head, which correlates with the infarct seen on the same-day MRI. No evidence of additional acute infarct. No acute intracranial hemorrhage. Electronically Signed   By: Merilyn Baba M.D.   On: 01/27/2022 03:01   MR BRAIN WO CONTRAST  Addendum Date: 01/26/2022   ADDENDUM REPORT: 01/26/2022 21:36 ADDENDUM: On further review, the degree of T2 hyperintensity associated with the right caudate  head infarct is greater than what would be expected for a typical infarct. This could be secondary to the presence of a small amount of infarct associated hemorrhage. Consider repeat head CT to assess for interval change. These results will be called to the ordering clinician or representative by the Radiologist Assistant, and communication documented in the PACS or [employer]. Electronically Signed   By: Marin Roberts M.D.   On: 01/26/2022 21:36   Result Date: 01/26/2022 CLINICAL DATA:  Stroke suspected. EXAM: MRI HEAD WITHOUT CONTRAST TECHNIQUE: Multiplanar, multiecho pulse sequences of the brain and surrounding structures were obtained without intravenous contrast. COMPARISON:  Same-day CT brain and CTA head and neck angiogram FINDINGS: Brain: Acute infarct in the right caudate head (series 5, image 35). No evidence of hemorrhage. Sequela of mild to moderate chronic microvascular ischemic change. No hydrocephalus. No extra-axial fluid collection. Vascular: Normal flow voids. See same day CT head and neck angiogram for additional findings. Skull and upper cervical spine: There is diffusely heterogeneous marrow signal, particularly in the cervical spine. Sinuses/Orbits: Redemonstrated are changes bilateral sphenoid sinusitis. Bilateral lens replacement. Trace right mastoid effusion. Other: None IMPRESSION: Acute infarct in the right caudate head.  No hemorrhage. Electronically Signed: By: Marin Roberts M.D. On: 01/26/2022 15:11   US RENAL  Result Date: 01/26/2022 CLINICAL DATA:  nse acute kidney injury. EXAM: RENAL / URINARY TRACT ULTRASOUND COMPLETE COMPARISON:  CT abdomen and pelvis 12/25/2020 FINDINGS: Right Kidney: Renal measurements: 13.1 x 6.4 x 6.8 cm = volume: 295 mL. Cortical thinning with increased echogenicity. No hydronephrosis. Left Kidney: Renal measurements: 13.6 x 6.4 x 5.4 cm = volume: 247 mL. Cortical thinning. Small exophytic simple cyst measuring 1.8 cm noted in the lower pole. No  hydronephrosis. Bladder: Appears normal for degree of bladder distention. Other: None. IMPRESSION: 1. No hydronephrosis. 2. Bilateral renal cortical thinning with increased echogenicity on the right suggesting chronic medical renal disease. Electronically Signed   By: Misty Stanley M.D.   On: 01/26/2022 14:40   CT ANGIO HEAD NECK W WO CM (CODE STROKE)  Result Date: 01/26/2022 CLINICAL DATA:  Neuro deficit, acute, stroke suspected. Acute left arm and leg numbness and dysarthria. EXAM: CT ANGIOGRAPHY HEAD AND NECK TECHNIQUE: Multidetector CT imaging of the head and neck was performed using the standard protocol during bolus administration of intravenous contrast. Multiplanar CT image reconstructions and MIPs were obtained to evaluate the vascular anatomy. Carotid stenosis measurements (when applicable) are obtained utilizing NASCET criteria, using the distal internal carotid diameter as the denominator. RADIATION DOSE REDUCTION: This exam was performed according to the departmental dose-optimization program which includes automated exposure control, adjustment of the mA and/or kV according to patient size and/or use of iterative reconstruction technique. CONTRAST:  67m OMNIPAQUE IOHEXOL 350 MG/ML SOLN COMPARISON:  None Available. FINDINGS: CTA NECK FINDINGS Aortic arch: Standard 3 vessel aortic arch with mild atherosclerotic plaque. Mixed calcified and soft plaque in the subclavian arteries without a flow limiting stenosis. Right carotid system: Patent with a small amount of calcified plaque at the carotid bifurcation and minimal calcified plaque in the distal cervical ICA. No evidence of a significant stenosis or dissection. Left carotid system: Patent with a small amount of scattered, predominantly calcified plaque in the common carotid artery, at the carotid bifurcation, and in the mid to distal cervical ICA. No evidence of a significant stenosis or dissection. Vertebral arteries: Patent with the left being  dominant. Predominantly calcified plaque at the vertebral artery origins results in moderate to severe stenosis on the left and at most mild stenosis on the right. Skeleton: Advanced disc degeneration in the cervical and upper thoracic spine. Interbody ankylosis at C3-4, C4-5, C7-T1, T1-2, and T3-4. Other neck: No evidence of cervical lymphadenopathy or mass. Upper chest: Clear lung apices. Review of the MIP images confirms the above findings CTA HEAD FINDINGS Anterior circulation: The internal carotid arteries are patent from skull base to carotid termini with calcified plaque resulting in mild cavernous and paraclinoid stenoses bilaterally. ACAs and MCAs are patent without evidence of a proximal branch occlusion or flow limiting A1 or M1 stenosis. There is an early bifurcation of  the right MCA. A severe mid right P2 branch stenosis is noted. No aneurysm is identified. Posterior circulation: The intracranial vertebral arteries are patent with the right being diminutive distal to the PICA origin. There are severe right and moderate to severe left V4 stenoses due to heavily calcified plaque. Patent PICA and SCA origins are seen bilaterally. The basilar artery is patent with mild atherosclerotic irregularity but no significant stenosis. Posterior communicating arteries are diminutive or absent. The PCAs are patent with widespread advanced atherosclerotic irregularity including severe mid distal right P2 and mid left P2 stenoses. No aneurysm is identified. Venous sinuses: As permitted by contrast timing, patent. Anatomic variants: None of significance. Review of the MIP images confirms the above findings Preliminary finding of no LVO was communicated to Dr. Quinn Axe at 11:27 am on 01/26/2022 by text page via the [facility] messaging system. IMPRESSION: 1. No large vessel occlusion. 2. Advanced intracranial atherosclerosis including severe bilateral P2, V4, and right M2 stenoses. 3. Moderate to severe left vertebral artery  origin stenosis. 4. Mild cervical carotid artery atherosclerosis without significant stenosis. 5.  Aortic Atherosclerosis (ICD10-I70.0). Electronically Signed   By: Logan Bores M.D.   On: 01/26/2022 11:41   CT HEAD CODE STROKE WO CONTRAST  Result Date: 01/26/2022 CLINICAL DATA:  Code stroke.  Neuro deficit, acute, stroke suspected EXAM: CT HEAD WITHOUT CONTRAST TECHNIQUE: Contiguous axial images were obtained from the base of the skull through the vertex without intravenous contrast. RADIATION DOSE REDUCTION: This exam was performed according to the departmental dose-optimization program which includes automated exposure control, adjustment of the mA and/or kV according to patient size and/or use of iterative reconstruction technique. COMPARISON:  CT head February 03, 2021. FINDINGS: Brain: No evidence of acute large vascular territory infarction, hemorrhage, hydrocephalus, extra-axial collection or mass lesion/mass effect. Vascular: No hyperdense vessel identified. Calcific atherosclerosis. Skull: No acute fracture. Sinuses/Orbits: Paranasal sinus mucosal thickening with near complete left sphenoid sinus opacification. No acute orbital findings. Other: No mastoid effusions. ASPECTS [facility] Stroke Program Early CT Score) total score (0-10 with 10 being normal): 10. IMPRESSION: 1. No evidence of acute intracranial abnormality. 2. ASPECTS is 10. Code stroke imaging results were communicated on 01/26/2022 at 11:18 am to provider [facility] via secure text paging. Electronically Signed   By: Margaretha Sheffield M.D.   On: 01/26/2022 11:18   DG Hand Complete Right  Result Date: 01/17/2022 CLINICAL DATA:  Crush injury. Crust right hand between wheel well and tire while helping saw unchanged attire. EXAM: RIGHT HAND - COMPLETE 3+ VIEW COMPARISON:  None Available. FINDINGS: Mildly decreased bone mineralization. Severe second through fifth DIP joint space narrowing with moderate thumb interphalangeal and second through  fifth PIP joint space narrowing. Degenerative osteophytes are greatest at the fifth DIP joint. Severe index finger metacarpophalangeal joint space narrowing. Markedly severe thumb carpometacarpal joint space narrowing with bone-on-bone contact, subchondral sclerosis and cystic change, and peripheral osteophytosis. Moderate triscaphe joint space narrowing. No acute fracture is seen.  No dislocation. Moderate atherosclerotic calcifications. IMPRESSION: 1. No acute fracture is seen. 2. Severe thumb carpometacarpal joint, diffuse DIP joint, and index finger metacarpophalangeal joint osteoarthritis. Electronically Signed   By: Yvonne Kendall M.D.   On: 01/17/2022 19:17      Subjective: Patient seen and examined at bedside today.  Hemodynamically stable without any complaints.  Medically stable for discharge  Discharge Exam: Vitals:   01/31/22 0342 01/31/22 0808  BP: (!) 140/37 (!) 124/50  Pulse: (!) 56 (!) 57  Resp: 18 16  Temp: (!) 97.5 F (36.4 C) 98 F (36.7 C)  SpO2: 98% 98%   Vitals:   01/30/22 2359 01/31/22 0333 01/31/22 0342 01/31/22 0808  BP:   (!) 140/37 (!) 124/50  Pulse:   (!) 56 (!) 57  Resp: '16  18 16  '$ Temp:   (!) 97.5 F (36.4 C) 98 F (36.7 C)  TempSrc:   Oral   SpO2:   98% 98%  Weight:  104.8 kg    Height:        General: Pt is alert, awake, not in acute distress Cardiovascular: RRR, S1/S2 +, no rubs, no gallops Respiratory: CTA bilaterally, no wheezing, no rhonchi Abdominal: Soft, NT, ND, bowel sounds + Extremities: no edema, no cyanosis    The results of significant diagnostics from this hospitalization (including imaging, microbiology, ancillary and laboratory) are listed below for reference.     Microbiology: No results found for this or any previous visit (from the past 240 hour(s)).   Labs: BNP (last 3 results) No results for input(s): "BNP" in the last 8760 hours. Basic Metabolic Panel: Recent Labs  Lab 01/27/22 0549 01/28/22 0606 01/29/22 0531  01/30/22 0519 01/31/22 0532  NA 141 145 140 141 140  K 4.4 4.1 4.2 4.2 4.3  CL 116* 116* 115* 116* 115*  CO2 22 23 21* 22 21*  GLUCOSE 112* 108* 173* 79 156*  BUN 44* 36* 27* 22 21  CREATININE 2.38* 1.89* 1.79* 1.66* 1.79*  CALCIUM 8.3* 8.2* 8.2* 8.5* 8.6*   Liver Function Tests: Recent Labs  Lab 01/26/22 1138  AST 25  ALT 18  ALKPHOS 108  BILITOT 1.0  PROT 5.9*  ALBUMIN 3.3*   No results for input(s): "LIPASE", "AMYLASE" in the last 168 hours. No results for input(s): "AMMONIA" in the last 168 hours. CBC: Recent Labs  Lab 01/26/22 1138 01/27/22 0549 01/29/22 0531 01/30/22 0519 01/31/22 0532  WBC 6.5 7.2 8.0 8.8 8.2  NEUTROABS 5.0  --   --   --   --   HGB 12.1* 11.2* 11.2* 11.5* 10.7*  HCT 34.9* 32.5* 32.5* 32.8* 30.0*  MCV 90.9 90.8 91.5 90.6 90.6  PLT 97* 100* 104* 95* 90*   Cardiac Enzymes: No results for input(s): "CKTOTAL", "CKMB", "CKMBINDEX", "TROPONINI" in the last 168 hours. BNP: Invalid input(s): "POCBNP" CBG: Recent Labs  Lab 01/30/22 0855 01/30/22 1145 01/30/22 1609 01/30/22 2027 01/31/22 0809  GLUCAP 84 183* 231* 181* 164*   D-Dimer No results for input(s): "DDIMER" in the last 72 hours. Hgb A1c No results for input(s): "HGBA1C" in the last 72 hours. Lipid Profile No results for input(s): "CHOL", "HDL", "LDLCALC", "TRIG", "CHOLHDL", "LDLDIRECT" in the last 72 hours. Thyroid function studies No results for input(s): "TSH", "T4TOTAL", "T3FREE", "THYROIDAB" in the last 72 hours.  Invalid input(s): "FREET3" Anemia work up No results for input(s): "VITAMINB12", "FOLATE", "FERRITIN", "TIBC", "IRON", "RETICCTPCT" in the last 72 hours. Urinalysis    Component Value Date/Time   COLORURINE YELLOW 02/03/2021 0405   APPEARANCEUR CLEAR 02/03/2021 0405   APPEARANCEUR Clear 12/27/2017 0943   LABSPEC 1.020 02/03/2021 0405   LABSPEC 1.018 02/22/2013 2122   PHURINE 5.5 02/03/2021 0405   GLUCOSEU NEGATIVE 02/03/2021 0405   GLUCOSEU Negative  02/22/2013 2122   HGBUR NEGATIVE 02/03/2021 0405   BILIRUBINUR NEGATIVE 02/03/2021 0405   BILIRUBINUR small 12/07/2018 1118   BILIRUBINUR Negative 12/27/2017 0943   BILIRUBINUR Negative 02/22/2013 2122   KETONESUR NEGATIVE 02/03/2021 0405   PROTEINUR 30 (A) 02/03/2021 0405   UROBILINOGEN  0.2 12/07/2018 1118   UROBILINOGEN 0.2 06/03/2013 2218   NITRITE NEGATIVE 02/03/2021 0405   LEUKOCYTESUR NEGATIVE 02/03/2021 0405   LEUKOCYTESUR Negative 02/22/2013 2122   Sepsis Labs Recent Labs  Lab 01/27/22 0549 01/29/22 0531 01/30/22 0519 01/31/22 0532  WBC 7.2 8.0 8.8 8.2   Microbiology No results found for this or any previous visit (from the past 240 hour(s)).  Please note: You were cared for by a hospitalist during your hospital stay. Once you are discharged, your primary care physician will handle any further medical issues. Please note that NO REFILLS for any discharge medications will be authorized once you are discharged, as it is imperative that you return to your primary care physician (or establish a relationship with a primary care physician if you do not have one) for your post hospital discharge needs so that they can reassess your need for medications and monitor your lab values.    Time coordinating discharge: 40 minutes  SIGNED:   Shelly Coss, MD  [facility] 01/31/2022, 10:23 AM Pager [phone]  If 7PM-7AM, please contact night-coverage [email or web address] Password [email or web address]

## 2022-01-31 NOTE — Progress Notes (Signed)
OT Cancellation Note  Patient Details Name: [name] [name] MRN: [id number] DOB: Dec 02, 1933   Cancelled Treatment:    Reason Eval/Treat Not Completed: Patient at procedure or test/ unavailable. Upon attempt, transport staff present shortly after OT's arrival to take pt for CT scan. Will re-attempt OT tx at later time as pt is available.   Ardeth Perfect., MPH, MS, OTR/L ascom [phone] 01/31/22, 8:53 AM

## 2022-01-31 NOTE — Progress Notes (Signed)
Please perform head CT wo contrast today. If normal, OK to restart anticoagulation. If e/o ICH, please contact neurology for further recommendations.  Su Monks, MD [facility] [phone]  If 7pm- 7am, please page neurology on call as listed in [place].

## 2022-01-31 NOTE — Plan of Care (Signed)

## 2022-02-01 ENCOUNTER — Telehealth: Payer: Self-pay | Admitting: *Deleted

## 2022-02-01 NOTE — Patient Outreach (Signed)
  Care Coordination [facility] Note Transition Care Management Follow-up Telephone Call Date of discharge and from where: [facility] [id number] How have you been since you were released from the hospital? I have a little cold , but otherwise I feel ok Any questions or concerns? No  Items Reviewed: Did the pt receive and understand the discharge instructions provided? N RN went over the appt with PCP and have a BMP drawn and a referral to nephrology Medications obtained and verified? Yes  Other? No  Any new allergies since your discharge? No  Dietary orders reviewed? Yes Low sodium heart healthy diet Do you have support at home? Yes   Home Care and Equipment/Supplies: Were home health services ordered? yes If so, what is the name of the agency? [facility]  Has the agency set up a time to come to the patient's home? No RN gave patient Alvis Lemmings phone number to follow up when they would come Were any new equipment or medical supplies ordered?  No What is the name of the medical supply agency? N  Were you able to get the supplies/equipment? not applicable Do you have any questions related to the use of the equipment or supplies? No  Functional Questionnaire: (I = Independent and D = Dependent) ADLs: I  Bathing/Dressing- I  Meal Prep- D  Eating- I  Maintaining continence- I  Transferring/Ambulation- I  Managing Meds- I  Follow up appointments reviewed:  PCP Hospital f/u appt confirmed? Yes  Dr [facility] f/u appt confirmed? No  . Are transportation arrangements needed? No  If their condition worsens, is the pt aware to call PCP or go to the Emergency Dept.? Yes Was the patient provided with contact information for the PCP's office or ED? Yes Was to pt encouraged to call back with questions or concerns? Yes  SDOH assessments and interventions completed:   Yes SDOH Screenings   Food Insecurity: No Food Insecurity (02/01/2022)  Housing: Low Risk  (02/01/2022)   Transportation Needs: No Transportation Needs (02/01/2022)  Utilities: Not At Risk (02/01/2022)  Depression (PHQ2-9): Low Risk  (10/06/2021)  Financial Resource Strain: Low Risk  (07/07/2021)  Physical Activity: Sufficiently Active (07/03/2020)  Social Connections: Unknown (07/07/2021)  Stress: No Stress Concern Present (07/07/2021)  Tobacco Use: Low Risk  (01/26/2022)    Care Coordination Interventions Activated:  Yes   Care Coordination Interventions:  Referred for Care Coordination Services:  RN Care Coordinator  Quinn Plowman 02/11/2022 at 11 Am  Encounter Outcome:  Pt. Visit Completed   [place] [facility] [phone]

## 2022-02-02 ENCOUNTER — Other Ambulatory Visit: Payer: Self-pay

## 2022-02-02 MED ORDER — OMEPRAZOLE 20 MG PO CPDR: 20.0000 mg | DELAYED_RELEASE_CAPSULE | Freq: Two times a day (BID) | ORAL | 1 refills | Status: DC

## 2022-02-02 MED ORDER — GABAPENTIN 100 MG PO CAPS: 100.0000 mg | ORAL_CAPSULE | Freq: Three times a day (TID) | ORAL | 1 refills | Status: DC

## 2022-02-02 NOTE — Telephone Encounter (Signed)
Prescriptions sent in to [facility]

## 2022-02-03 ENCOUNTER — Telehealth: Payer: Self-pay

## 2022-02-03 NOTE — Telephone Encounter (Signed)
     Patient  visit on 01/17/2022  at [facility] was for Unspecified injury of right wrist, hand and finger(s), initial encounter.  Have you been able to follow up with your primary care physician? Patient has seen Ortho specialist.  The patient was or was not able to obtain any needed medicine or equipment. Patient obtained medication.  Are there diet recommendations that you are having difficulty following? No  Patient expresses understanding of discharge instructions and education provided has no other needs at this time.    [place] Resource Care Guide   [email or web address]  ?? [phone]   Website: [email or web address]  [email or web address]

## 2022-02-04 ENCOUNTER — Ambulatory Visit (INDEPENDENT_AMBULATORY_CARE_PROVIDER_SITE_OTHER): Payer: Medicare Other | Admitting: Family Medicine

## 2022-02-04 ENCOUNTER — Encounter: Payer: Self-pay | Admitting: Family Medicine

## 2022-02-04 VITALS — BP 108/62 | HR 56 | Temp 97.8°F | Ht 73.0 in | Wt 220.8 lb

## 2022-02-04 DIAGNOSIS — I639 Cerebral infarction, unspecified: Secondary | ICD-10-CM | POA: Diagnosis not present

## 2022-02-04 DIAGNOSIS — I482 Chronic atrial fibrillation, unspecified: Secondary | ICD-10-CM

## 2022-02-04 DIAGNOSIS — N183 Chronic kidney disease, stage 3 unspecified: Secondary | ICD-10-CM

## 2022-02-04 DIAGNOSIS — I10 Essential (primary) hypertension: Secondary | ICD-10-CM | POA: Diagnosis not present

## 2022-02-04 DIAGNOSIS — E118 Type 2 diabetes mellitus with unspecified complications: Secondary | ICD-10-CM | POA: Diagnosis not present

## 2022-02-04 DIAGNOSIS — E1122 Type 2 diabetes mellitus with diabetic chronic kidney disease: Secondary | ICD-10-CM | POA: Diagnosis not present

## 2022-02-04 DIAGNOSIS — N1831 Chronic kidney disease, stage 3a: Secondary | ICD-10-CM

## 2022-02-04 DIAGNOSIS — S6721XS Crushing injury of right hand, sequela: Secondary | ICD-10-CM

## 2022-02-04 MED ORDER — OMEPRAZOLE 20 MG PO CPDR: 20.0000 mg | DELAYED_RELEASE_CAPSULE | Freq: Two times a day (BID) | ORAL | 1 refills | Status: DC

## 2022-02-04 MED ORDER — GABAPENTIN 100 MG PO CAPS: 100.0000 mg | ORAL_CAPSULE | Freq: Three times a day (TID) | ORAL | 1 refills | Status: DC

## 2022-02-04 NOTE — Assessment & Plan Note (Signed)
Recheck kidney function.  Refer to nephrology.

## 2022-02-04 NOTE — Assessment & Plan Note (Signed)
Patient will continue Toujeo 41 units daily.  Will check an A1c.  He will start checking 2-hour postprandial sugars after lunch and continue to check fasting sugars.  He will get me a list of his readings in 2 weeks and then we can determine if he needs mealtime insulin.

## 2022-02-04 NOTE — Assessment & Plan Note (Signed)
Patient will continue to see orthopedics for this issue.

## 2022-02-04 NOTE — Progress Notes (Signed)
[name], MD Phone: [phone]  [name] [name] is a 88 y.o. male who presents today for hospital follow-up  CVA: Patient developed left-sided weakness.  He was evaluated in the hospital and found to have a stroke at the right caudate head.  He had a CT angiogram of his head that revealed bilateral P2, V2, and right M2 stenosis.  Neurology evaluated him and recommended no changes to his medicines.  He remains on Eliquis, Zetia, and Crestor.  They did not recommend starting aspirin.  They did recommend neurology follow-up.  He starts physical therapy at home tomorrow.  He notes his weakness has improved though he does still feel somewhat weak on the left side.  CKD stage III: Patient had acute worsening of his kidney function in the hospital.  Needs this to be rechecked.  He reports they advised he should see a nephrologist.  Right hand crush injury: This occurred in earlier November.  He was evaluated in the emergency department and has been following with a orthopedic hand specialist.  He continues to dress the wound.  Diabetes: Patient reports fasting sugars in the 140s or lower typically.  When he was in the hospital he had sugars into the 200s in the afternoon and evening.  He has been on Toujeo 41 units daily.  Social History   Tobacco Use  Smoking Status Never  Smokeless Tobacco Never    Current Outpatient Medications on File Prior to Visit  Medication Sig Dispense Refill   amiodarone (PACERONE) 200 MG tablet Take 200 mg by mouth daily.     apixaban (ELIQUIS) 5 MG TABS tablet Take 5 mg by mouth 2 (two) times daily.     BD PEN NEEDLE NANO U/F 32G X 4 MM MISC USE AS DIRECTED 100 each 0   carvedilol (COREG) 12.5 MG tablet TAKE ONE (1) TABLET BY MOUTH TWO TIMES PER DAY (Patient taking differently: Take 12.5 mg by mouth 2 (two) times daily with a meal.) 60 tablet 0   Cholecalciferol (VITAMIN D3) 50 MCG (2000 UT) TABS Take 2,000 Units by mouth in the morning.     dapagliflozin  propanediol (FARXIGA) 10 MG TABS tablet Take 1 tablet (10 mg total) by mouth daily before breakfast. 90 tablet 1   doxazosin (CARDURA) 2 MG tablet Take 1 tablet (2 mg total) by mouth 2 (two) times daily. Please keep upcoming appt for future refills 180 tablet 0   ezetimibe (ZETIA) 10 MG tablet TAKE 1 TABLET BY MOUTH DAILY 90 tablet 0   finasteride (PROSCAR) 5 MG tablet Take 1 tablet (5 mg total) by mouth daily. (Patient taking differently: Take 5 mg by mouth every evening.) 90 tablet 1   hydrALAZINE (APRESOLINE) 25 MG tablet Take 1 tablet (25 mg total) by mouth 3 (three) times daily. 270 tablet 3   insulin glargine, 1 Unit Dial, (TOUJEO) 300 UNIT/ML Solostar Pen Inject 40 units under the skin daily 13.5 mL 3   isosorbide mononitrate (IMDUR) 30 MG 24 hr tablet Take 1 tablet (30 mg total) by mouth daily. Please call [phone] for a sooner appointment and for further refills. 30 tablet 0   loperamide (IMODIUM A-D) 2 MG tablet Take 1 tablet (2 mg total) by mouth 2 (two) times daily as needed for up to 60 doses for diarrhea or loose stools. (Patient taking differently: Take 2 mg by mouth in the morning.) 60 tablet 0   Multiple Vitamins-Minerals (PRESERVISION AREDS 2 PO) Take 1 tablet by mouth in the morning and  at bedtime.     potassium chloride (KLOR-CON) 10 MEQ tablet Take 1 tablet (10 mEq total) by mouth daily as needed. Take with torsemide 90 tablet 3   rosuvastatin (CRESTOR) 20 MG tablet Take 1 tablet (20 mg total) by mouth daily. 90 tablet 3   torsemide (DEMADEX) 20 MG tablet FOR WEIGHT 210-215 TAKE 1 TABLET WITH POTASSIUM  FOR WEIGHT GREATER THAN 215 TAKE 1 TABLET TWICE A DAY (8AM & 2PM) WITH 2 POTASSIUM 200 tablet 0   TOUJEO SOLOSTAR 300 UNIT/ML Solostar Pen Inject 40 Units into the skin daily. 22.5 mL 6   valsartan (DIOVAN) 320 MG tablet Take 1 tablet (320 mg total) by mouth daily. At night d/c 160s mg 90 tablet 3   No current facility-administered medications on file prior to visit.      ROS see history of present illness  Objective  Physical Exam Vitals:   02/04/22 1116  BP: 108/62  Pulse: (!) 56  Temp: 97.8 F (36.6 C)  SpO2: 99%    BP Readings from Last 3 Encounters:  02/04/22 108/62  01/31/22 (!) 144/60  01/17/22 (!) 157/59   Wt Readings from Last 3 Encounters:  02/04/22 220 lb 12.8 oz (100.2 kg)  01/31/22 231 lb 0.7 oz (104.8 kg)  01/17/22 217 lb (98.4 kg)    Physical Exam Constitutional:      General: He is not in acute distress.    Appearance: He is not diaphoretic.  Cardiovascular:     Rate and Rhythm: Normal rate and regular rhythm.     Heart sounds: Normal heart sounds.  Pulmonary:     Effort: Pulmonary effort is normal.     Breath sounds: Normal breath sounds.  Skin:    General: Skin is warm and dry.     Comments: Well-healing wound on the dorsum of his right hand, no surrounding erythema or drainage  Neurological:     Mental Status: He is alert.     Comments: 4+/5 strength in left bicep, tricep, grip, quads, hamstrings, plantar and dorsiflexion, 5/5 strength in right bicep, tricep, grip, quads, hamstrings, plantar and dorsiflexion, sensation to light touch intact in bilateral UE and LE      Assessment/Plan: Please see individual problem list.  Problem List Items Addressed This Visit     Atrial fibrillation, chronic (HCC) (Chronic)    He will continue Eliquis 5 mg twice daily.  He will continue carvedilol 12.5 mg twice daily.  Amiodarone is managed by cardiology.      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  Refer to nephrology.      Essential hypertension (Chronic)    Well-controlled.  He will continue carvedilol 12.5 mg twice daily, Cardura 2 mg twice daily, hydralazine 25 mg 3 times daily, Imdur 30 mg daily.       Type 2 diabetes mellitus with complication, without long-term current use of insulin (HCC) - Primary (Chronic)    Patient will continue Toujeo 41 units daily.   Will check an A1c.  He will start checking 2-hour postprandial sugars after lunch and continue to check fasting sugars.  He will get me a list of his readings in 2 weeks and then we can determine if he needs mealtime insulin.      Relevant Orders   HgB A1c   Acute CVA (cerebrovascular accident) [facility])    Notes he has been improving.  He will continue risk factor management.  He  will remain on Eliquis 5 mg twice daily.  He will see neurology as planned.      Hand crush injury, right, sequela    Patient will continue to see orthopedics for this issue.      Other Visit Diagnoses     Stage 3a chronic kidney disease ([place])       Relevant Orders   Ambulatory referral to Nephrology   Basic Metabolic Panel (BMET)       Return in about 3 months (around 05/06/2022) for dm.   [name], MD [place]

## 2022-02-04 NOTE — Assessment & Plan Note (Signed)
He will continue Eliquis 5 mg twice daily.  He will continue carvedilol 12.5 mg twice daily.  Amiodarone is managed by cardiology.

## 2022-02-04 NOTE — Patient Instructions (Signed)
Nice to see you. Please let me know what your sugars are 2 hours after lunch. Please keep track of this for 1-2 weeks and bring your results in for me to review.

## 2022-02-04 NOTE — Assessment & Plan Note (Signed)
Notes he has been improving.  He will continue risk factor management.  He will remain on Eliquis 5 mg twice daily.  He will see neurology as planned.

## 2022-02-04 NOTE — Assessment & Plan Note (Signed)
Well-controlled.  He will continue carvedilol 12.5 mg twice daily, Cardura 2 mg twice daily, hydralazine 25 mg 3 times daily, Imdur 30 mg daily.

## 2022-02-06 DIAGNOSIS — S60921D Unspecified superficial injury of right hand, subsequent encounter: Secondary | ICD-10-CM | POA: Diagnosis not present

## 2022-02-06 DIAGNOSIS — I2510 Atherosclerotic heart disease of native coronary artery without angina pectoris: Secondary | ICD-10-CM | POA: Diagnosis not present

## 2022-02-06 DIAGNOSIS — I69322 Dysarthria following cerebral infarction: Secondary | ICD-10-CM | POA: Diagnosis not present

## 2022-02-06 DIAGNOSIS — I69354 Hemiplegia and hemiparesis following cerebral infarction affecting left non-dominant side: Secondary | ICD-10-CM | POA: Diagnosis not present

## 2022-02-06 DIAGNOSIS — I4821 Permanent atrial fibrillation: Secondary | ICD-10-CM | POA: Diagnosis not present

## 2022-02-07 DIAGNOSIS — S61411A Laceration without foreign body of right hand, initial encounter: Secondary | ICD-10-CM | POA: Insufficient documentation

## 2022-02-07 DIAGNOSIS — S61411D Laceration without foreign body of right hand, subsequent encounter: Secondary | ICD-10-CM | POA: Diagnosis not present

## 2022-02-07 DIAGNOSIS — S6721XA Crushing injury of right hand, initial encounter: Secondary | ICD-10-CM | POA: Diagnosis not present

## 2022-02-07 HISTORY — DX: Laceration without foreign body of right hand, initial encounter: S61.411A

## 2022-02-08 DIAGNOSIS — U071 COVID-19: Secondary | ICD-10-CM | POA: Diagnosis not present

## 2022-02-08 DIAGNOSIS — E1169 Type 2 diabetes mellitus with other specified complication: Secondary | ICD-10-CM | POA: Diagnosis not present

## 2022-02-08 DIAGNOSIS — Z794 Long term (current) use of insulin: Secondary | ICD-10-CM | POA: Diagnosis not present

## 2022-02-08 DIAGNOSIS — R5383 Other fatigue: Secondary | ICD-10-CM | POA: Diagnosis not present

## 2022-02-08 DIAGNOSIS — Z03818 Encounter for observation for suspected exposure to other biological agents ruled out: Secondary | ICD-10-CM | POA: Diagnosis not present

## 2022-02-09 ENCOUNTER — Other Ambulatory Visit: Payer: Self-pay

## 2022-02-09 ENCOUNTER — Telehealth: Payer: Self-pay

## 2022-02-09 MED ORDER — INSULIN GLARGINE (1 UNIT DIAL) 300 UNIT/ML ~~LOC~~ SOPN: 40.0000 [IU] | PEN_INJECTOR | Freq: Every day | SUBCUTANEOUS | 3 refills | Status: DC

## 2022-02-09 NOTE — Telephone Encounter (Signed)
Patient states he went to a walk-in clinic yesterday and tested positive for covid.  Patient states he was prescribed Lagevrio '200MG'$ .  Patient states he did not sleep a wink last night and he has a cough that is unproductive.  Patient states his chest hurts, feels like somebody hit it with a sledge hammer.  I transferred call to Access Nurse.

## 2022-02-10 ENCOUNTER — Other Ambulatory Visit: Payer: Self-pay | Admitting: Cardiovascular Disease

## 2022-02-11 ENCOUNTER — Ambulatory Visit: Payer: Self-pay

## 2022-02-11 ENCOUNTER — Telehealth: Payer: Self-pay | Admitting: Family Medicine

## 2022-02-11 ENCOUNTER — Telehealth: Payer: Self-pay

## 2022-02-11 DIAGNOSIS — S61401A Unspecified open wound of right hand, initial encounter: Secondary | ICD-10-CM | POA: Diagnosis not present

## 2022-02-11 MED ORDER — BENZONATATE 200 MG PO CAPS: 200.0000 mg | ORAL_CAPSULE | Freq: Two times a day (BID) | ORAL | 0 refills | Status: DC | PRN

## 2022-02-11 NOTE — Telephone Encounter (Signed)
Cesario from [place] [facility] called stating pt want to move appointment to next week and home health need approval to do this

## 2022-02-11 NOTE — Addendum Note (Signed)
Addended by: [name] on: 02/11/2022 03:29 PM   Modules accepted: Orders

## 2022-02-11 NOTE — Patient Outreach (Signed)
  Care Coordination   02/11/2022 Name: [name] [name] MRN: [id number] DOB: 30-Nov-1933   Care Coordination Outreach Attempts:  Successful outreach made with patient.  Patient states he was just diagnosed with COVID and is not feeling well today.  Request call back at another times.   Follow Up Plan:  Additional outreach attempts will be made to offer the patient care coordination information and services.   Encounter Outcome:  Pt. Request to Call Back   Care Coordination Interventions:  No, not indicated    Quinn Plowman [facility] [phone] direct line

## 2022-02-11 NOTE — Telephone Encounter (Signed)
Access nurse note sent to PCP

## 2022-02-11 NOTE — Telephone Encounter (Signed)
Noted. It may take several weeks for the cough to improve significantly. He should be reevaluated for any worsening symptoms or new symptoms.

## 2022-02-11 NOTE — Telephone Encounter (Signed)
Pt has been informed but is wondering is there any cough medicine you can call in for him to help as well? He would like [facility].

## 2022-02-11 NOTE — Telephone Encounter (Signed)
I sent Tessalon in for him.  For anything stronger he would have to complete a visit with me or do a virtual video visit with somebody else.

## 2022-02-11 NOTE — Telephone Encounter (Signed)
Pt was sent to access nurse due to this phone note below:  [name] [name] P2 days ago    Patient states he went to a walk-in clinic yesterday and tested positive for covid.  Patient states he was prescribed Lagevrio '200MG'$ .  Patient states he did not sleep a wink last night and he has a cough that is unproductive.  Patient states his chest hurts, feels like somebody hit it with a sledge hammer.  I transferred call to Access Nurse.      Note   [name] [name], [name] [name] [phone]  [name] [name] P2 days ago    I called pt to check on his wellbeing as Access nurse advised him to go to the ED. Pt stated he refused to go to the ED. He states he is still sick just has a bad cough and his chest hurts with the on going coughing. See Access nurse note below:

## 2022-02-12 DIAGNOSIS — I69354 Hemiplegia and hemiparesis following cerebral infarction affecting left non-dominant side: Secondary | ICD-10-CM | POA: Diagnosis not present

## 2022-02-12 DIAGNOSIS — I2510 Atherosclerotic heart disease of native coronary artery without angina pectoris: Secondary | ICD-10-CM | POA: Diagnosis not present

## 2022-02-12 DIAGNOSIS — I4821 Permanent atrial fibrillation: Secondary | ICD-10-CM | POA: Diagnosis not present

## 2022-02-12 DIAGNOSIS — I69322 Dysarthria following cerebral infarction: Secondary | ICD-10-CM | POA: Diagnosis not present

## 2022-02-12 DIAGNOSIS — S60921D Unspecified superficial injury of right hand, subsequent encounter: Secondary | ICD-10-CM | POA: Diagnosis not present

## 2022-02-14 NOTE — Telephone Encounter (Signed)
Called and informed the patient that the provider sent in tessalon for his cough and if he needed anything stronger he needed to be seen and he understood. Treyvon Blahut,cma

## 2022-02-15 NOTE — Telephone Encounter (Signed)
That is fine 

## 2022-02-16 ENCOUNTER — Other Ambulatory Visit: Payer: Self-pay | Admitting: Family Medicine

## 2022-02-16 DIAGNOSIS — I4821 Permanent atrial fibrillation: Secondary | ICD-10-CM | POA: Diagnosis not present

## 2022-02-16 DIAGNOSIS — I69322 Dysarthria following cerebral infarction: Secondary | ICD-10-CM | POA: Diagnosis not present

## 2022-02-16 DIAGNOSIS — S60921D Unspecified superficial injury of right hand, subsequent encounter: Secondary | ICD-10-CM | POA: Diagnosis not present

## 2022-02-16 DIAGNOSIS — I2510 Atherosclerotic heart disease of native coronary artery without angina pectoris: Secondary | ICD-10-CM | POA: Diagnosis not present

## 2022-02-16 DIAGNOSIS — I69354 Hemiplegia and hemiparesis following cerebral infarction affecting left non-dominant side: Secondary | ICD-10-CM | POA: Diagnosis not present

## 2022-02-16 NOTE — Telephone Encounter (Signed)
I called [name] [name] of [facility] and informed him that the provider stated it was okat to move the appointment and he understood.  [name] [name],cma

## 2022-02-16 NOTE — Telephone Encounter (Signed)
Error

## 2022-02-21 ENCOUNTER — Encounter: Payer: Self-pay | Admitting: Cardiovascular Disease

## 2022-02-21 ENCOUNTER — Ambulatory Visit: Payer: Medicare Other | Attending: Cardiovascular Disease | Admitting: Cardiovascular Disease

## 2022-02-22 ENCOUNTER — Telehealth: Payer: Self-pay | Admitting: Family Medicine

## 2022-02-22 NOTE — Telephone Encounter (Signed)
Cefer called from [facility] to inform the provider the patient refused PT on today and requested to cancel. Marland Kitchen

## 2022-02-23 ENCOUNTER — Other Ambulatory Visit: Payer: Self-pay

## 2022-02-23 ENCOUNTER — Ambulatory Visit: Payer: Self-pay

## 2022-02-23 DIAGNOSIS — Z7901 Long term (current) use of anticoagulants: Secondary | ICD-10-CM

## 2022-02-23 DIAGNOSIS — I083 Combined rheumatic disorders of mitral, aortic and tricuspid valves: Secondary | ICD-10-CM

## 2022-02-23 DIAGNOSIS — N179 Acute kidney failure, unspecified: Secondary | ICD-10-CM

## 2022-02-23 DIAGNOSIS — N1832 Chronic kidney disease, stage 3b: Secondary | ICD-10-CM

## 2022-02-23 DIAGNOSIS — S60921D Unspecified superficial injury of right hand, subsequent encounter: Secondary | ICD-10-CM | POA: Diagnosis not present

## 2022-02-23 DIAGNOSIS — I130 Hypertensive heart and chronic kidney disease with heart failure and stage 1 through stage 4 chronic kidney disease, or unspecified chronic kidney disease: Secondary | ICD-10-CM

## 2022-02-23 DIAGNOSIS — Z951 Presence of aortocoronary bypass graft: Secondary | ICD-10-CM

## 2022-02-23 DIAGNOSIS — I5032 Chronic diastolic (congestive) heart failure: Secondary | ICD-10-CM

## 2022-02-23 DIAGNOSIS — Z7984 Long term (current) use of oral hypoglycemic drugs: Secondary | ICD-10-CM

## 2022-02-23 DIAGNOSIS — I4821 Permanent atrial fibrillation: Secondary | ICD-10-CM

## 2022-02-23 DIAGNOSIS — I69322 Dysarthria following cerebral infarction: Secondary | ICD-10-CM | POA: Diagnosis not present

## 2022-02-23 DIAGNOSIS — I69354 Hemiplegia and hemiparesis following cerebral infarction affecting left non-dominant side: Secondary | ICD-10-CM | POA: Diagnosis not present

## 2022-02-23 DIAGNOSIS — Z794 Long term (current) use of insulin: Secondary | ICD-10-CM

## 2022-02-23 DIAGNOSIS — D696 Thrombocytopenia, unspecified: Secondary | ICD-10-CM

## 2022-02-23 DIAGNOSIS — M189 Osteoarthritis of first carpometacarpal joint, unspecified: Secondary | ICD-10-CM

## 2022-02-23 DIAGNOSIS — I2510 Atherosclerotic heart disease of native coronary artery without angina pectoris: Secondary | ICD-10-CM | POA: Diagnosis not present

## 2022-02-23 DIAGNOSIS — Z9181 History of falling: Secondary | ICD-10-CM

## 2022-02-23 DIAGNOSIS — I77819 Aortic ectasia, unspecified site: Secondary | ICD-10-CM

## 2022-02-23 DIAGNOSIS — E1122 Type 2 diabetes mellitus with diabetic chronic kidney disease: Secondary | ICD-10-CM

## 2022-02-23 NOTE — Telephone Encounter (Signed)
Noted  

## 2022-03-02 LAB — HEMOGLOBIN A1C

## 2022-03-08 ENCOUNTER — Other Ambulatory Visit: Payer: Self-pay | Admitting: Family Medicine

## 2022-03-08 DIAGNOSIS — E118 Type 2 diabetes mellitus with unspecified complications: Secondary | ICD-10-CM

## 2022-03-10 ENCOUNTER — Ambulatory Visit (INDEPENDENT_AMBULATORY_CARE_PROVIDER_SITE_OTHER): Payer: Medicare Other

## 2022-03-10 ENCOUNTER — Encounter: Payer: Self-pay | Admitting: Family Medicine

## 2022-03-10 ENCOUNTER — Ambulatory Visit (INDEPENDENT_AMBULATORY_CARE_PROVIDER_SITE_OTHER): Payer: Medicare Other | Admitting: Family Medicine

## 2022-03-10 VITALS — BP 130/70 | HR 75 | Temp 98.3°F | Ht 73.0 in | Wt 217.0 lb

## 2022-03-10 DIAGNOSIS — J40 Bronchitis, not specified as acute or chronic: Secondary | ICD-10-CM | POA: Diagnosis not present

## 2022-03-10 DIAGNOSIS — R059 Cough, unspecified: Secondary | ICD-10-CM | POA: Diagnosis not present

## 2022-03-10 MED ORDER — AMOXICILLIN-POT CLAVULANATE 875-125 MG PO TABS: 1.0000 | ORAL_TABLET | Freq: Two times a day (BID) | ORAL | 0 refills | Status: DC

## 2022-03-10 MED ORDER — GUAIFENESIN-CODEINE 100-10 MG/5ML PO SOLN: 10.0000 mL | Freq: Three times a day (TID) | ORAL | 0 refills | Status: DC | PRN

## 2022-03-10 NOTE — Progress Notes (Signed)
[name], MD Phone: [phone]  [name] [name] is a 89 y.o. male who presents today for same-day visit.  Respiratory illness: Patient notes onset of symptoms in November.  He subsequently tested positive for COVID 2 weeks after symptom onset and was treated with Paxlovid.  He has continued to have symptoms of cough, nasal and chest congestion, and blowing mucus out of his nose and coughing mucus out.  No fevers.  He does note some shortness of breath though this is not much different than his chronic dyspnea.  He has had sore throat and postnasal drip.  He is used a spray for his throat that does help some and has been taking some Mucinex.  Social History   Tobacco Use  Smoking Status Never  Smokeless Tobacco Never    Current Outpatient Medications on File Prior to Visit  Medication Sig Dispense Refill   amiodarone (PACERONE) 200 MG tablet Take 200 mg by mouth daily.     apixaban (ELIQUIS) 5 MG TABS tablet Take 5 mg by mouth 2 (two) times daily.     BD PEN NEEDLE NANO U/F 32G X 4 MM MISC USE AS DIRECTED 100 each 0   benzonatate (TESSALON) 200 MG capsule Take 1 capsule (200 mg total) by mouth 2 (two) times daily as needed for cough. 20 capsule 0   carvedilol (COREG) 12.5 MG tablet TAKE ONE (1) TABLET BY MOUTH TWO TIMES PER DAY 60 tablet 0   Cholecalciferol (VITAMIN D3) 50 MCG (2000 UT) TABS Take 2,000 Units by mouth in the morning.     dapagliflozin propanediol (FARXIGA) 10 MG TABS tablet Take 1 tablet (10 mg total) by mouth daily before breakfast. 90 tablet 1   doxazosin (CARDURA) 2 MG tablet Take 1 tablet (2 mg total) by mouth 2 (two) times daily. Please keep upcoming appt for future refills 180 tablet 0   ENTRESTO 24-26 MG Take 1 tablet by mouth 2 (two) times daily.     ezetimibe (ZETIA) 10 MG tablet TAKE 1 TABLET BY MOUTH DAILY 90 tablet 0   finasteride (PROSCAR) 5 MG tablet Take 1 tablet (5 mg total) by mouth daily. (Patient taking differently: Take 5 mg by mouth every  evening.) 90 tablet 1   gabapentin (NEURONTIN) 100 MG capsule Take 1 capsule (100 mg total) by mouth 3 (three) times daily. 90 capsule 1   hydrALAZINE (APRESOLINE) 25 MG tablet Take 1 tablet (25 mg total) by mouth 3 (three) times daily. 270 tablet 3   insulin glargine, 1 Unit Dial, (TOUJEO) 300 UNIT/ML Solostar Pen Inject 40 units under the skin daily 13.5 mL 3   insulin glargine, 1 Unit Dial, (TOUJEO) 300 UNIT/ML Solostar Pen Inject 40 Units into the skin daily. 13.5 mL 3   isosorbide mononitrate (IMDUR) 30 MG 24 hr tablet Take 1 tablet (30 mg total) by mouth daily. Please call [phone] for a sooner appointment and for further refills. 30 tablet 0   LAGEVRIO 200 MG CAPS capsule SMARTSIG:4 Capsule(s) By Mouth Every 12 Hours     loperamide (IMODIUM A-D) 2 MG tablet Take 1 tablet (2 mg total) by mouth 2 (two) times daily as needed for up to 60 doses for diarrhea or loose stools. (Patient taking differently: Take 2 mg by mouth in the morning.) 60 tablet 0   Multiple Vitamins-Minerals (PRESERVISION AREDS 2 PO) Take 1 tablet by mouth in the morning and at bedtime.     omeprazole (PRILOSEC) 20 MG capsule Take 1 capsule (20 mg total)  by mouth 2 (two) times daily. 60 capsule 1   potassium chloride (KLOR-CON) 10 MEQ tablet Take 1 tablet (10 mEq total) by mouth daily as needed. Take with torsemide 90 tablet 3   rosuvastatin (CRESTOR) 20 MG tablet Take 1 tablet (20 mg total) by mouth daily. 90 tablet 3   torsemide (DEMADEX) 20 MG tablet FOR WEIGHT 210-215 TAKE 1 TABLET WITH POTASSIUM  FOR WEIGHT GREATER THAN 215 TAKE 1 TABLET TWICE A DAY (8AM & 2PM) WITH 2 POTASSIUM 200 tablet 0   TOUJEO SOLOSTAR 300 UNIT/ML Solostar Pen Inject 40 Units into the skin daily. 22.5 mL 6   valsartan (DIOVAN) 320 MG tablet Take 1 tablet (320 mg total) by mouth daily. At night d/c 160s mg 90 tablet 3   No current facility-administered medications on file prior to visit.     ROS see history of present  illness  Objective  Physical Exam Vitals:   03/10/22 1014  BP: 130/70  Pulse: 75  Temp: 98.3 F (36.8 C)  SpO2: 96%    BP Readings from Last 3 Encounters:  03/10/22 130/70  02/04/22 108/62  01/31/22 (!) 144/60   Wt Readings from Last 3 Encounters:  03/10/22 217 lb (98.4 kg)  02/04/22 220 lb 12.8 oz (100.2 kg)  01/31/22 231 lb 0.7 oz (104.8 kg)    Physical Exam Constitutional:      General: He is not in acute distress.    Appearance: He is not diaphoretic.  HENT:     Right Ear: Tympanic membrane normal.     Ears:     Comments: Left TM obscured by cerumen Cardiovascular:     Rate and Rhythm: Normal rate and regular rhythm.     Heart sounds: Normal heart sounds.  Pulmonary:     Effort: Pulmonary effort is normal.     Breath sounds: Normal breath sounds.  Lymphadenopathy:     Cervical: No cervical adenopathy.  Skin:    General: Skin is warm and dry.  Neurological:     Mental Status: He is alert.      Assessment/Plan: Please see individual problem list.  Bronchitis Assessment & Plan: Patient with likely bronchitis/sinusitis.  Given his chronic dyspnea issues and his persistent symptoms we will check a chest x-ray to evaluate for an underlying pneumonia.  I will proceed with treatment with Augmentin 1 tablet twice daily for 7 days.  Discussed if his chest x-ray reveals pneumonia we will add an additional medication.  He will seek medical attention if he has worsening shortness of breath, cough productive of blood, fevers or any other worsening symptoms.  Will treat his cough with codeine cough syrup.  He was advised to discontinue use of this if he develops excessive drowsiness with taking it.  Orders: -     guaiFENesin-Codeine; Take 10 mLs by mouth 3 (three) times daily as needed for cough.  Dispense: 120 mL; Refill: 0 -     DG Chest 2 View; Future -     Amoxicillin-Pot Clavulanate; Take 1 tablet by mouth 2 (two) times daily.  Dispense: 14 tablet; Refill:  0    Return if symptoms worsen or fail to improve.   [name], MD [place]

## 2022-03-10 NOTE — Patient Instructions (Signed)
Nice to see you. We are going to treat you with Augmentin for possible bronchitis and sinusitis.  You can also use the codeine cough syrup for help with your cough.  If you get excessively drowsy with the cough syrup please discontinue using it. Will get a chest x-ray and contact you with the results. If you develop cough productive of blood, shortness of breath that is worsening, fevers, or any new symptoms please seek medical attention.

## 2022-03-10 NOTE — Assessment & Plan Note (Signed)
Patient with likely bronchitis/sinusitis.  Given his chronic dyspnea issues and his persistent symptoms we will check a chest x-ray to evaluate for an underlying pneumonia.  I will proceed with treatment with Augmentin 1 tablet twice daily for 7 days.  Discussed if his chest x-ray reveals pneumonia we will add an additional medication.  He will seek medical attention if he has worsening shortness of breath, cough productive of blood, fevers or any other worsening symptoms.  Will treat his cough with codeine cough syrup.  He was advised to discontinue use of this if he develops excessive drowsiness with taking it.

## 2022-03-12 DIAGNOSIS — N179 Acute kidney failure, unspecified: Secondary | ICD-10-CM | POA: Diagnosis not present

## 2022-03-12 DIAGNOSIS — B3749 Other urogenital candidiasis: Secondary | ICD-10-CM | POA: Diagnosis not present

## 2022-03-12 DIAGNOSIS — I2510 Atherosclerotic heart disease of native coronary artery without angina pectoris: Secondary | ICD-10-CM | POA: Diagnosis not present

## 2022-03-12 DIAGNOSIS — R053 Chronic cough: Secondary | ICD-10-CM | POA: Diagnosis present

## 2022-03-12 DIAGNOSIS — J9601 Acute respiratory failure with hypoxia: Secondary | ICD-10-CM | POA: Diagnosis present

## 2022-03-12 DIAGNOSIS — Z8673 Personal history of transient ischemic attack (TIA), and cerebral infarction without residual deficits: Secondary | ICD-10-CM

## 2022-03-12 DIAGNOSIS — R079 Chest pain, unspecified: Secondary | ICD-10-CM | POA: Diagnosis not present

## 2022-03-12 DIAGNOSIS — D649 Anemia, unspecified: Secondary | ICD-10-CM | POA: Diagnosis present

## 2022-03-12 DIAGNOSIS — E860 Dehydration: Secondary | ICD-10-CM | POA: Diagnosis present

## 2022-03-12 DIAGNOSIS — Z951 Presence of aortocoronary bypass graft: Secondary | ICD-10-CM

## 2022-03-12 DIAGNOSIS — E785 Hyperlipidemia, unspecified: Secondary | ICD-10-CM | POA: Diagnosis present

## 2022-03-12 DIAGNOSIS — K219 Gastro-esophageal reflux disease without esophagitis: Secondary | ICD-10-CM | POA: Diagnosis not present

## 2022-03-12 DIAGNOSIS — I5032 Chronic diastolic (congestive) heart failure: Secondary | ICD-10-CM | POA: Diagnosis present

## 2022-03-12 DIAGNOSIS — N400 Enlarged prostate without lower urinary tract symptoms: Secondary | ICD-10-CM | POA: Diagnosis present

## 2022-03-12 DIAGNOSIS — I272 Pulmonary hypertension, unspecified: Secondary | ICD-10-CM | POA: Diagnosis present

## 2022-03-12 DIAGNOSIS — Z794 Long term (current) use of insulin: Secondary | ICD-10-CM

## 2022-03-12 DIAGNOSIS — I4821 Permanent atrial fibrillation: Secondary | ICD-10-CM | POA: Diagnosis present

## 2022-03-12 DIAGNOSIS — R918 Other nonspecific abnormal finding of lung field: Secondary | ICD-10-CM | POA: Diagnosis not present

## 2022-03-12 DIAGNOSIS — I10 Essential (primary) hypertension: Secondary | ICD-10-CM | POA: Diagnosis not present

## 2022-03-12 DIAGNOSIS — J929 Pleural plaque without asbestos: Secondary | ICD-10-CM | POA: Diagnosis not present

## 2022-03-12 DIAGNOSIS — Z8616 Personal history of COVID-19: Secondary | ICD-10-CM | POA: Diagnosis not present

## 2022-03-12 DIAGNOSIS — E1122 Type 2 diabetes mellitus with diabetic chronic kidney disease: Secondary | ICD-10-CM | POA: Diagnosis not present

## 2022-03-12 DIAGNOSIS — I130 Hypertensive heart and chronic kidney disease with heart failure and stage 1 through stage 4 chronic kidney disease, or unspecified chronic kidney disease: Secondary | ICD-10-CM | POA: Diagnosis not present

## 2022-03-12 DIAGNOSIS — N1832 Chronic kidney disease, stage 3b: Secondary | ICD-10-CM | POA: Diagnosis present

## 2022-03-12 DIAGNOSIS — Z9861 Coronary angioplasty status: Secondary | ICD-10-CM

## 2022-03-12 DIAGNOSIS — R112 Nausea with vomiting, unspecified: Secondary | ICD-10-CM | POA: Diagnosis not present

## 2022-03-12 DIAGNOSIS — E11649 Type 2 diabetes mellitus with hypoglycemia without coma: Secondary | ICD-10-CM | POA: Diagnosis not present

## 2022-03-12 DIAGNOSIS — Z1152 Encounter for screening for COVID-19: Secondary | ICD-10-CM

## 2022-03-12 DIAGNOSIS — I700 Atherosclerosis of aorta: Secondary | ICD-10-CM | POA: Diagnosis not present

## 2022-03-12 DIAGNOSIS — Z79899 Other long term (current) drug therapy: Secondary | ICD-10-CM

## 2022-03-12 DIAGNOSIS — Z888 Allergy status to other drugs, medicaments and biological substances status: Secondary | ICD-10-CM

## 2022-03-12 DIAGNOSIS — J101 Influenza due to other identified influenza virus with other respiratory manifestations: Secondary | ICD-10-CM | POA: Diagnosis not present

## 2022-03-12 DIAGNOSIS — U071 COVID-19: Secondary | ICD-10-CM | POA: Diagnosis not present

## 2022-03-12 DIAGNOSIS — I358 Other nonrheumatic aortic valve disorders: Secondary | ICD-10-CM | POA: Diagnosis not present

## 2022-03-12 DIAGNOSIS — Z801 Family history of malignant neoplasm of trachea, bronchus and lung: Secondary | ICD-10-CM

## 2022-03-12 DIAGNOSIS — R059 Cough, unspecified: Secondary | ICD-10-CM | POA: Diagnosis not present

## 2022-03-12 DIAGNOSIS — Z8249 Family history of ischemic heart disease and other diseases of the circulatory system: Secondary | ICD-10-CM

## 2022-03-12 DIAGNOSIS — J984 Other disorders of lung: Secondary | ICD-10-CM | POA: Diagnosis not present

## 2022-03-12 DIAGNOSIS — Z7901 Long term (current) use of anticoagulants: Secondary | ICD-10-CM

## 2022-03-12 DIAGNOSIS — N281 Cyst of kidney, acquired: Secondary | ICD-10-CM | POA: Diagnosis not present

## 2022-03-12 DIAGNOSIS — A419 Sepsis, unspecified organism: Secondary | ICD-10-CM | POA: Diagnosis not present

## 2022-03-12 NOTE — ED Notes (Signed)
First Nurse Note: Pt arrives via [facility] from home with complaints of N/V for the last 2-3 weeks. Dx with COVID 3 weeks ago. Seen by PCP for persistent coughing and congestion - had a CT 2 days ago but results are still pending.   VS with EMS CBG-210 99.3 154/60 71 HR  95%

## 2022-03-13 ENCOUNTER — Other Ambulatory Visit: Payer: Self-pay

## 2022-03-13 ENCOUNTER — Inpatient Hospital Stay
Admission: EM | Admit: 2022-03-13 | Discharge: 2022-03-17 | DRG: 193 | Disposition: A | Discharge status: Home Health | Payer: Medicare Other | Attending: Student in an Organized Health Care Education/Training Program | Admitting: Student in an Organized Health Care Education/Training Program

## 2022-03-13 ENCOUNTER — Emergency Department: Payer: Medicare Other

## 2022-03-13 ENCOUNTER — Encounter: Payer: Self-pay | Admitting: Internal Medicine

## 2022-03-13 DIAGNOSIS — R918 Other nonspecific abnormal finding of lung field: Secondary | ICD-10-CM | POA: Diagnosis not present

## 2022-03-13 DIAGNOSIS — I4821 Permanent atrial fibrillation: Secondary | ICD-10-CM | POA: Diagnosis present

## 2022-03-13 DIAGNOSIS — Z8616 Personal history of COVID-19: Secondary | ICD-10-CM | POA: Diagnosis not present

## 2022-03-13 DIAGNOSIS — R112 Nausea with vomiting, unspecified: Secondary | ICD-10-CM

## 2022-03-13 DIAGNOSIS — Z8249 Family history of ischemic heart disease and other diseases of the circulatory system: Secondary | ICD-10-CM | POA: Diagnosis not present

## 2022-03-13 DIAGNOSIS — J101 Influenza due to other identified influenza virus with other respiratory manifestations: Principal | ICD-10-CM | POA: Diagnosis present

## 2022-03-13 DIAGNOSIS — Z1152 Encounter for screening for COVID-19: Secondary | ICD-10-CM | POA: Diagnosis not present

## 2022-03-13 DIAGNOSIS — R079 Chest pain, unspecified: Secondary | ICD-10-CM | POA: Diagnosis not present

## 2022-03-13 DIAGNOSIS — E119 Type 2 diabetes mellitus without complications: Secondary | ICD-10-CM

## 2022-03-13 DIAGNOSIS — E785 Hyperlipidemia, unspecified: Secondary | ICD-10-CM | POA: Diagnosis present

## 2022-03-13 DIAGNOSIS — I5032 Chronic diastolic (congestive) heart failure: Secondary | ICD-10-CM | POA: Diagnosis present

## 2022-03-13 DIAGNOSIS — N1832 Chronic kidney disease, stage 3b: Secondary | ICD-10-CM | POA: Diagnosis present

## 2022-03-13 DIAGNOSIS — I2510 Atherosclerotic heart disease of native coronary artery without angina pectoris: Secondary | ICD-10-CM | POA: Diagnosis present

## 2022-03-13 DIAGNOSIS — J929 Pleural plaque without asbestos: Secondary | ICD-10-CM | POA: Diagnosis not present

## 2022-03-13 DIAGNOSIS — B3749 Other urogenital candidiasis: Secondary | ICD-10-CM | POA: Diagnosis present

## 2022-03-13 DIAGNOSIS — I130 Hypertensive heart and chronic kidney disease with heart failure and stage 1 through stage 4 chronic kidney disease, or unspecified chronic kidney disease: Secondary | ICD-10-CM | POA: Diagnosis present

## 2022-03-13 DIAGNOSIS — R059 Cough, unspecified: Secondary | ICD-10-CM | POA: Diagnosis not present

## 2022-03-13 DIAGNOSIS — A419 Sepsis, unspecified organism: Secondary | ICD-10-CM | POA: Diagnosis not present

## 2022-03-13 DIAGNOSIS — N179 Acute kidney failure, unspecified: Secondary | ICD-10-CM | POA: Diagnosis present

## 2022-03-13 DIAGNOSIS — I700 Atherosclerosis of aorta: Secondary | ICD-10-CM | POA: Diagnosis not present

## 2022-03-13 DIAGNOSIS — J984 Other disorders of lung: Secondary | ICD-10-CM | POA: Diagnosis not present

## 2022-03-13 DIAGNOSIS — Z801 Family history of malignant neoplasm of trachea, bronchus and lung: Secondary | ICD-10-CM | POA: Diagnosis not present

## 2022-03-13 DIAGNOSIS — E118 Type 2 diabetes mellitus with unspecified complications: Secondary | ICD-10-CM

## 2022-03-13 DIAGNOSIS — E11649 Type 2 diabetes mellitus with hypoglycemia without coma: Secondary | ICD-10-CM | POA: Diagnosis not present

## 2022-03-13 DIAGNOSIS — N400 Enlarged prostate without lower urinary tract symptoms: Secondary | ICD-10-CM | POA: Diagnosis present

## 2022-03-13 DIAGNOSIS — E860 Dehydration: Secondary | ICD-10-CM | POA: Diagnosis present

## 2022-03-13 DIAGNOSIS — K219 Gastro-esophageal reflux disease without esophagitis: Secondary | ICD-10-CM | POA: Diagnosis present

## 2022-03-13 DIAGNOSIS — D649 Anemia, unspecified: Secondary | ICD-10-CM | POA: Diagnosis present

## 2022-03-13 DIAGNOSIS — N281 Cyst of kidney, acquired: Secondary | ICD-10-CM | POA: Diagnosis not present

## 2022-03-13 DIAGNOSIS — Z794 Long term (current) use of insulin: Secondary | ICD-10-CM | POA: Diagnosis not present

## 2022-03-13 DIAGNOSIS — I5020 Unspecified systolic (congestive) heart failure: Secondary | ICD-10-CM

## 2022-03-13 DIAGNOSIS — I272 Pulmonary hypertension, unspecified: Secondary | ICD-10-CM | POA: Diagnosis present

## 2022-03-13 DIAGNOSIS — I482 Chronic atrial fibrillation, unspecified: Secondary | ICD-10-CM | POA: Diagnosis present

## 2022-03-13 DIAGNOSIS — Z951 Presence of aortocoronary bypass graft: Secondary | ICD-10-CM | POA: Diagnosis not present

## 2022-03-13 DIAGNOSIS — I6782 Cerebral ischemia: Secondary | ICD-10-CM | POA: Diagnosis not present

## 2022-03-13 DIAGNOSIS — U071 COVID-19: Secondary | ICD-10-CM | POA: Diagnosis not present

## 2022-03-13 DIAGNOSIS — E1122 Type 2 diabetes mellitus with diabetic chronic kidney disease: Secondary | ICD-10-CM | POA: Diagnosis present

## 2022-03-13 DIAGNOSIS — J9601 Acute respiratory failure with hypoxia: Secondary | ICD-10-CM | POA: Diagnosis not present

## 2022-03-13 DIAGNOSIS — Z7901 Long term (current) use of anticoagulants: Secondary | ICD-10-CM | POA: Diagnosis not present

## 2022-03-13 DIAGNOSIS — B3742 Candidal balanitis: Secondary | ICD-10-CM

## 2022-03-13 DIAGNOSIS — I358 Other nonrheumatic aortic valve disorders: Secondary | ICD-10-CM | POA: Diagnosis not present

## 2022-03-13 LAB — URINALYSIS, ROUTINE W REFLEX MICROSCOPIC
Bacteria, UA: NONE SEEN
Bilirubin Urine: NEGATIVE
Glucose, UA: >=500 mg/dL — AB
Hgb urine dipstick: NEGATIVE
Ketones, ur: NEGATIVE mg/dL
Leukocytes,Ua: NEGATIVE
Nitrite: NEGATIVE
Protein, ur: 30 mg/dL — AB
Specific Gravity, Urine: 1.023 (ref 1.005–1.030)
pH: 5 (ref 5.0–8.0)

## 2022-03-13 LAB — COMPREHENSIVE METABOLIC PANEL
ALT: 20 U/L (ref 0–44)
AST: 31 U/L (ref 15–41)
Albumin: 3.6 g/dL (ref 3.5–5.0)
Alkaline Phosphatase: 117 U/L (ref 38–126)
Anion gap: 9 (ref 5–15)
BUN: 22 mg/dL (ref 8–23)
CO2: 22 mmol/L (ref 22–32)
Calcium: 8.5 mg/dL — ABNORMAL LOW (ref 8.9–10.3)
Chloride: 106 mmol/L (ref 98–111)
Creatinine, Ser: 1.9 mg/dL — ABNORMAL HIGH (ref 0.61–1.24)
GFR, Estimated: 34 mL/min — ABNORMAL LOW (ref >60)
Glucose, Bld: 192 mg/dL — ABNORMAL HIGH (ref 70–99)
Potassium: 4 mmol/L (ref 3.5–5.1)
Sodium: 137 mmol/L (ref 135–145)
Total Bilirubin: 1.2 mg/dL (ref 0.3–1.2)
Total Protein: 6.6 g/dL (ref 6.5–8.1)

## 2022-03-13 LAB — TROPONIN I (HIGH SENSITIVITY)
Troponin I (High Sensitivity): 19 ng/L — ABNORMAL HIGH (ref <18)
Troponin I (High Sensitivity): 30 ng/L — ABNORMAL HIGH (ref <18)

## 2022-03-13 LAB — CBC WITH DIFFERENTIAL/PLATELET
Abs Immature Granulocytes: 0.02 10*3/uL (ref 0.00–0.07)
Basophils Absolute: 0 10*3/uL (ref 0.0–0.1)
Basophils Relative: 0 %
Eosinophils Absolute: 0.1 10*3/uL (ref 0.0–0.5)
Eosinophils Relative: 1 %
HCT: 32.9 % — ABNORMAL LOW (ref 39.0–52.0)
Hemoglobin: 10.9 g/dL — ABNORMAL LOW (ref 13.0–17.0)
Immature Granulocytes: 0 %
Lymphocytes Relative: 5 %
Lymphs Abs: 0.3 10*3/uL — ABNORMAL LOW (ref 0.7–4.0)
MCH: 32.2 pg (ref 26.0–34.0)
MCHC: 33.1 g/dL (ref 30.0–36.0)
MCV: 97.1 fL (ref 80.0–100.0)
Monocytes Absolute: 0.6 10*3/uL (ref 0.1–1.0)
Monocytes Relative: 9 %
Neutro Abs: 6.1 10*3/uL (ref 1.7–7.7)
Neutrophils Relative %: 85 %
Platelets: 90 10*3/uL — ABNORMAL LOW (ref 150–400)
RBC: 3.39 MIL/uL — ABNORMAL LOW (ref 4.22–5.81)
RDW: 13.5 % (ref 11.5–15.5)
WBC: 7.1 10*3/uL (ref 4.0–10.5)
nRBC: 0 % (ref 0.0–0.2)

## 2022-03-13 LAB — PROCALCITONIN: Procalcitonin: <0.1 ng/mL

## 2022-03-13 LAB — RESP PANEL BY RT-PCR (RSV, FLU A&B, COVID)  RVPGX2
Influenza A by PCR: POSITIVE — AB
Influenza B by PCR: NEGATIVE
Resp Syncytial Virus by PCR: NEGATIVE
SARS Coronavirus 2 by RT PCR: NEGATIVE

## 2022-03-13 LAB — CBG MONITORING, ED
Glucose-Capillary: 171 mg/dL — ABNORMAL HIGH (ref 70–99)
Glucose-Capillary: 162 mg/dL — ABNORMAL HIGH (ref 70–99)

## 2022-03-13 LAB — BRAIN NATRIURETIC PEPTIDE: B Natriuretic Peptide: 339 pg/mL — ABNORMAL HIGH (ref 0.0–100.0)

## 2022-03-13 MED ORDER — SODIUM CHLORIDE 0.45 % IV SOLN: INTRAVENOUS | Status: AC

## 2022-03-13 MED ORDER — ACETAMINOPHEN 650 MG RE SUPP: 650.0000 mg | Freq: Four times a day (QID) | RECTAL | Status: DC | PRN

## 2022-03-13 MED ORDER — ONDANSETRON HCL 4 MG/2ML IJ SOLN
4.0000 mg | Freq: Once | INTRAMUSCULAR | Status: AC
  Administered 2022-03-13: 4 mg via INTRAVENOUS
  Filled 2022-03-13: qty 2

## 2022-03-13 MED ORDER — GUAIFENESIN-CODEINE 100-10 MG/5ML PO SOLN
10.0000 mL | Freq: Three times a day (TID) | ORAL | Status: DC | PRN
  Administered 2022-03-14 – 2022-03-16 (×7): 10 mL via ORAL
  Filled 2022-03-13 (×7): qty 10

## 2022-03-13 MED ORDER — AMIODARONE HCL 200 MG PO TABS
200.0000 mg | ORAL_TABLET | Freq: Every day | ORAL | Status: DC
  Administered 2022-03-13 – 2022-03-17 (×5): 200 mg via ORAL
  Filled 2022-03-13 (×5): qty 1

## 2022-03-13 MED ORDER — TORSEMIDE 20 MG PO TABS: 20.0000 mg | ORAL_TABLET | Freq: Two times a day (BID) | ORAL | Status: DC | PRN

## 2022-03-13 MED ORDER — SACUBITRIL-VALSARTAN 24-26 MG PO TABS
1.0000 | ORAL_TABLET | Freq: Two times a day (BID) | ORAL | Status: DC
  Administered 2022-03-13 – 2022-03-15 (×5): 1 via ORAL
  Filled 2022-03-13 (×5): qty 1

## 2022-03-13 MED ORDER — EZETIMIBE 10 MG PO TABS
10.0000 mg | ORAL_TABLET | Freq: Every day | ORAL | Status: DC
  Administered 2022-03-13 – 2022-03-17 (×5): 10 mg via ORAL
  Filled 2022-03-13 (×5): qty 1

## 2022-03-13 MED ORDER — SODIUM CHLORIDE 0.9% FLUSH: 3.0000 mL | Freq: Two times a day (BID) | INTRAVENOUS | Status: DC

## 2022-03-13 MED ORDER — ROSUVASTATIN CALCIUM 20 MG PO TABS
20.0000 mg | ORAL_TABLET | Freq: Every day | ORAL | Status: DC
  Administered 2022-03-13 – 2022-03-14 (×2): 20 mg via ORAL
  Filled 2022-03-13 (×2): qty 1

## 2022-03-13 MED ORDER — TRAZODONE HCL 50 MG PO TABS
25.0000 mg | ORAL_TABLET | Freq: Every evening | ORAL | Status: DC | PRN
  Administered 2022-03-14: 25 mg via ORAL
  Filled 2022-03-13: qty 1

## 2022-03-13 MED ORDER — CARVEDILOL 6.25 MG PO TABS
12.5000 mg | ORAL_TABLET | Freq: Two times a day (BID) | ORAL | Status: DC
  Administered 2022-03-13 – 2022-03-17 (×6): 12.5 mg via ORAL
  Filled 2022-03-13 (×6): qty 2

## 2022-03-13 MED ORDER — FINASTERIDE 5 MG PO TABS
5.0000 mg | ORAL_TABLET | Freq: Every evening | ORAL | Status: DC
  Administered 2022-03-13 – 2022-03-16 (×4): 5 mg via ORAL
  Filled 2022-03-13 (×5): qty 1

## 2022-03-13 MED ORDER — ACETAMINOPHEN 325 MG PO TABS
650.0000 mg | ORAL_TABLET | Freq: Four times a day (QID) | ORAL | Status: DC | PRN
  Administered 2022-03-13 – 2022-03-14 (×2): 650 mg via ORAL
  Filled 2022-03-13 (×2): qty 2

## 2022-03-13 MED ORDER — APIXABAN 5 MG PO TABS
5.0000 mg | ORAL_TABLET | Freq: Two times a day (BID) | ORAL | Status: DC
  Administered 2022-03-13 – 2022-03-14 (×2): 5 mg via ORAL
  Filled 2022-03-13 (×2): qty 1

## 2022-03-13 MED ORDER — ISOSORBIDE MONONITRATE ER 30 MG PO TB24
30.0000 mg | ORAL_TABLET | Freq: Every day | ORAL | Status: DC
  Administered 2022-03-13 – 2022-03-17 (×5): 30 mg via ORAL
  Filled 2022-03-13 (×5): qty 1

## 2022-03-13 MED ORDER — POTASSIUM CHLORIDE CRYS ER 10 MEQ PO TBCR: 10.0000 meq | EXTENDED_RELEASE_TABLET | Freq: Every day | ORAL | Status: DC | PRN

## 2022-03-13 MED ORDER — LOPERAMIDE HCL 2 MG PO CAPS: 2.0000 mg | ORAL_CAPSULE | Freq: Every day | ORAL | Status: DC | PRN

## 2022-03-13 MED ORDER — DOXAZOSIN MESYLATE 2 MG PO TABS
2.0000 mg | ORAL_TABLET | Freq: Two times a day (BID) | ORAL | Status: DC
  Administered 2022-03-13 – 2022-03-17 (×7): 2 mg via ORAL
  Filled 2022-03-13 (×9): qty 1

## 2022-03-13 MED ORDER — INSULIN GLARGINE-YFGN 100 UNIT/ML ~~LOC~~ SOLN
30.0000 [IU] | Freq: Every day | SUBCUTANEOUS | Status: DC
  Administered 2022-03-13 – 2022-03-15 (×3): 30 [IU] via SUBCUTANEOUS
  Filled 2022-03-13 (×3): qty 0.3

## 2022-03-13 MED ORDER — INSULIN ASPART 100 UNIT/ML IJ SOLN
0.0000 [IU] | Freq: Three times a day (TID) | INTRAMUSCULAR | Status: DC
  Administered 2022-03-13 – 2022-03-15 (×4): 3 [IU] via SUBCUTANEOUS
  Administered 2022-03-15: 2 [IU] via SUBCUTANEOUS
  Administered 2022-03-15: 3 [IU] via SUBCUTANEOUS
  Filled 2022-03-13 (×6): qty 1

## 2022-03-13 MED ORDER — OSELTAMIVIR PHOSPHATE 75 MG PO CAPS: 75.0000 mg | ORAL_CAPSULE | Freq: Two times a day (BID) | ORAL | Status: DC

## 2022-03-13 MED ORDER — PANTOPRAZOLE SODIUM 40 MG PO TBEC
40.0000 mg | DELAYED_RELEASE_TABLET | Freq: Every day | ORAL | Status: DC
  Administered 2022-03-13 – 2022-03-17 (×5): 40 mg via ORAL
  Filled 2022-03-13 (×5): qty 1

## 2022-03-13 MED ORDER — HYDRALAZINE HCL 50 MG PO TABS
25.0000 mg | ORAL_TABLET | Freq: Three times a day (TID) | ORAL | Status: DC
  Administered 2022-03-13 – 2022-03-17 (×10): 25 mg via ORAL
  Filled 2022-03-13 (×11): qty 1

## 2022-03-13 MED ORDER — BENZONATATE 100 MG PO CAPS
200.0000 mg | ORAL_CAPSULE | Freq: Two times a day (BID) | ORAL | Status: DC | PRN
  Administered 2022-03-13 – 2022-03-14 (×2): 200 mg via ORAL
  Filled 2022-03-13 (×2): qty 2

## 2022-03-13 MED ORDER — SODIUM CHLORIDE 0.9 % IV BOLUS (SEPSIS)
1000.0000 mL | Freq: Once | INTRAVENOUS | Status: AC
  Administered 2022-03-13: 1000 mL via INTRAVENOUS

## 2022-03-13 MED ORDER — SODIUM CHLORIDE 0.9% FLUSH: 3.0000 mL | INTRAVENOUS | Status: DC | PRN

## 2022-03-13 MED ORDER — INSULIN GLARGINE (1 UNIT DIAL) 300 UNIT/ML ~~LOC~~ SOPN: 40.0000 [IU] | PEN_INJECTOR | Freq: Every day | SUBCUTANEOUS | Status: DC

## 2022-03-13 MED ORDER — DAPAGLIFLOZIN PROPANEDIOL 10 MG PO TABS
10.0000 mg | ORAL_TABLET | Freq: Every day | ORAL | Status: DC
  Administered 2022-03-14 – 2022-03-15 (×2): 10 mg via ORAL
  Filled 2022-03-13 (×4): qty 1

## 2022-03-13 MED ORDER — GABAPENTIN 100 MG PO CAPS
100.0000 mg | ORAL_CAPSULE | Freq: Three times a day (TID) | ORAL | Status: DC
  Administered 2022-03-13 – 2022-03-17 (×13): 100 mg via ORAL
  Filled 2022-03-13 (×13): qty 1

## 2022-03-13 MED ORDER — OSELTAMIVIR PHOSPHATE 30 MG PO CAPS
30.0000 mg | ORAL_CAPSULE | Freq: Two times a day (BID) | ORAL | Status: DC
  Administered 2022-03-13 – 2022-03-14 (×3): 30 mg via ORAL
  Filled 2022-03-13 (×3): qty 1

## 2022-03-13 MED ORDER — FENTANYL CITRATE PF 50 MCG/ML IJ SOSY
50.0000 ug | PREFILLED_SYRINGE | Freq: Once | INTRAMUSCULAR | Status: AC
  Administered 2022-03-13: 50 ug via INTRAVENOUS
  Filled 2022-03-13: qty 1

## 2022-03-13 MED ORDER — SODIUM CHLORIDE 0.9 % IV SOLN: 250.0000 mL | INTRAVENOUS | Status: DC | PRN

## 2022-03-13 NOTE — Assessment & Plan Note (Signed)
Patient with N/V related to influenza. Denies active reflux or GI pain.  Plan Continue PPI tx.

## 2022-03-13 NOTE — ED Notes (Signed)
Hospitalist at bedside 

## 2022-03-13 NOTE — ED Triage Notes (Addendum)
Pt arrives via [facility] with CC of vomiting and congestion that has been ongoing for multiple weeks. Reports COVID + approx 3 weeks ago. Reports coughing so severe that pt now has CP that is reproducible with cough.

## 2022-03-13 NOTE — Assessment & Plan Note (Signed)
Patient with stable persistent atrial fibrillation. He does take Eliquis. Other cardiac issue include CAD s/p CABG and PCI, HFpEF and HTN. He has been well managed medically and has controlled rate at admission and no chest pain.  Plan Continue all home cardiac medications  Continue Eliquis  Lab: BNP

## 2022-03-13 NOTE — Assessment & Plan Note (Signed)
Patient appears well compensated. Last ECHO November '23 revealed nl EF and diastolic parameters.   Plan Continue all home meds  Very gently hydration.  Administer torsemide daily while receiving IV hydration

## 2022-03-13 NOTE — ED Notes (Signed)
Pt O2 saturation did not go below 92% while ambulating in room.

## 2022-03-13 NOTE — ED Notes (Signed)
Repeat EKG requested by Dr. Leonides Schanz.  Completed.

## 2022-03-13 NOTE — Assessment & Plan Note (Signed)
Lat A1C from July '23 was 7.9 % He reports occasional low CBG readings on his current regimen. He reports that his PCP plans to refer him to endocrinology for consultation.  Plan Continue home regimen  Sliding scale coverage

## 2022-03-13 NOTE — ED Notes (Signed)
Per MD, continue with tylenol q6hr and apply cool compresses.

## 2022-03-13 NOTE — ED Notes (Signed)
RN notified provider of pt fever not reducing even after RN gave PRN dose of tylenol.

## 2022-03-13 NOTE — ED Provider Notes (Signed)
[facility] Provider Note    Event Date/Time   First MD Initiated Contact with Patient 03/13/22 [phone]     (approximate)   History   Emesis   HPI  [name] [name] is a 89 y.o. male with history of CAD, CHF, hypertension, hyperlipidemia, pulmonary hypertension who presents to the emergency department with his wife for concerns for 3 weeks of cough and congestion.  States he was admitted to the hospital recently for stroke and then afterwards was diagnosed with COVID-19 on 02/08/22.  States he is having chest soreness from so much coughing.  He is currently on Augmentin and guaifenesin with codeine.  States today started vomiting.  No diarrhea.  Reports soreness throughout the abdomen.  No urinary symptoms.  No shortness of breath.  Has not been vaccinated for influenza this year.   History provided by patient and wife.    Past Medical History:  Diagnosis Date   (HFpEF) heart failure with preserved ejection fraction ([place])    a. 11/3808 Echo: nl systolic fxn, mild LVH, diastolic relaxation abnormality, mildly enlarged LA, mild Ao insufficiency; 01/2018 Echo: EF 55-60%, no rwma, GR2 DD, triv AI, mildly dil LA, mild inc PASP.   AMS (altered mental status) 02/03/2021   BPH (benign prostatic hyperplasia)    Cancer ([place])    skin   Cataract    Choledocholithiasis    a. 05/2020 ERCP w/ CBD stone removal and biliary sphincterotomy.   Coronary artery disease    a. 01/2005 s/p CABG x 4: LIMA-LAD, VG-Diag, VG-OM3, VG-dRCA; b. 06/2013 Cath: patent grafts; c. 09/2015 Cath: VG->Diag 100, other grafts patent. LAD 100, RCA sev dzs->Med rx; d. 01/2018 Cath: LAD 100ost/p, LCX 50, RCA 100p/d, VG->OM3 100, VG->Diag 100, LIMA->LAD ok, VG->RPAV 61m->Med rx.   COVID-19    07/2020 had paxlovid    Dyspnea    with exertion   Dysrhythmia    ED (erectile dysfunction)    Elevated LFTs    a. 05/2020 following CBD stone. Liver biopsy consistent w/ changes related to CBD obstruction and not  chronic process.   GERD (gastroesophageal reflux disease)    Hyperlipidemia    Hypertension    IDDM (insulin dependent diabetes mellitus) 2000   Osteoarthritis    PAH ([place]ulmonary artery hypertension) [place]    a. 01/2018 RHC: PA 51/22 (32).   Perirectal fistula    Pneumonia 12/2016   Shingles 09/04/2018   Tubular adenoma of colon 07/2012   Vertigo     Past Surgical History:  Procedure Laterality Date   BACK SURGERY     CARDIAC CATHETERIZATION  06/05/2013   [facility]    CARDIAC CATHETERIZATION N/A 09/24/2015   Procedure: Right Heart Cath and Coronary/Graft Angiography;  Surgeon: TMinna Merritts MD;  [facility];  Service: Cardiovascular;  Laterality: N/A;   CATARACT EXTRACTION  sept 2013   right   CATARACT EXTRACTION W/PHACO Left 03/28/2017   Procedure: CATARACT EXTRACTION PHACO AND INTRAOCULAR LENS PLACEMENT (IOC);  Surgeon: PBirder Robson MD;  Location: [facility] ORS;  Service: Ophthalmology;  Laterality: Left;  [place]00:42.0 AP% 15.0 CDE 6.31 Fluid Pack lot # [id number]   CHOLECYSTECTOMY  05/15/2011   Procedure: LAPAROSCOPIC CHOLECYSTECTOMY;  Surgeon: MRolm Bookbinder MD;  [facility] [facility]  Service: General;  Laterality: N/A;   COLONOSCOPY W/ POLYPECTOMY     CORONARY ARTERY BYPASS GRAFT  2007   x 4   CORONARY STENT INTERVENTION N/A 12/08/2020   Procedure: CORONARY STENT INTERVENTION;  Surgeon:  End, Harrell Gave, MD;  Location: [place] [facility];  Service: Cardiovascular;  Laterality: N/A;   ERCP N/A 05/05/2020   Procedure: ENDOSCOPIC RETROGRADE CHOLANGIOPANCREATOGRAPHY (ERCP);  Surgeon: Lucilla Lame, MD;  Location: [facility] ENDOSCOPY;  Service: Endoscopy;  Laterality: N/A;   ERCP N/A 05/12/2020   Procedure: ENDOSCOPIC RETROGRADE CHOLANGIOPANCREATOGRAPHY (ERCP);  Surgeon: Lucilla Lame, MD;  Location: [facility] ENDOSCOPY;  Service: Endoscopy;  Laterality: N/A;   ESOPHAGOGASTRODUODENOSCOPY N/A 05/12/2020   Procedure: ESOPHAGOGASTRODUODENOSCOPY (EGD);  Surgeon: Lucilla Lame, MD;   Location: [facility] ENDOSCOPY;  Service: Endoscopy;  Laterality: N/A;   EYE SURGERY     FOOT SURGERY  2012   right foot   JOINT REPLACEMENT  8/10   Right THR--[name]   LEFT AND RIGHT HEART CATHETERIZATION WITH CORONARY/GRAFT ANGIOGRAM N/A 06/05/2013   Procedure: LEFT AND RIGHT HEART CATHETERIZATION WITH Beatrix Fetters;  Surgeon: Peter M Martinique, MD;  Location: [facility] CATH LAB;  Service: Cardiovascular;  Laterality: N/A;   LUMBAR LAMINECTOMY  1989   Prostate photovaporization  5/16   Dr Budd Palmer   RIGHT/LEFT HEART CATH AND CORONARY/GRAFT ANGIOGRAPHY N/A 01/05/2018   Procedure: RIGHT/LEFT HEART CATH AND CORONARY/GRAFT ANGIOGRAPHY;  Surgeon: Minna Merritts, MD;  Location: [place] [facility];  Service: Cardiovascular;  Laterality: N/A;   RIGHT/LEFT HEART CATH AND CORONARY/GRAFT ANGIOGRAPHY Bilateral 12/08/2020   Procedure: RIGHT/LEFT HEART CATH AND CORONARY/GRAFT ANGIOGRAPHY;  Surgeon: Nelva Bush, MD;  Location: [place] [facility];  Service: Cardiovascular;  Laterality: Bilateral;    MEDICATIONS:  Prior to Admission medications   Medication Sig Start Date End Date Taking? Authorizing Provider  amiodarone (PACERONE) 200 MG tablet Take 200 mg by mouth daily. 01/17/22   [provider]  amoxicillin-clavulanate (AUGMENTIN) 875-125 MG tablet Take 1 tablet by mouth 2 (two) times daily. 03/10/22   [name], MD  apixaban (ELIQUIS) 5 MG TABS tablet Take 5 mg by mouth 2 (two) times daily.    [provider]  BD PEN NEEDLE NANO U/F 32G X 4 MM MISC USE AS DIRECTED 02/16/22   [name], MD  benzonatate (TESSALON) 200 MG capsule Take 1 capsule (200 mg total) by mouth 2 (two) times daily as needed for cough. 02/11/22   [name], MD  carvedilol (COREG) 12.5 MG tablet TAKE ONE (1) TABLET BY MOUTH TWO TIMES PER DAY 02/10/22   Minna Merritts, MD  Cholecalciferol (VITAMIN D3) 50 MCG (2000 UT) TABS Take 2,000 Units by mouth in the morning.    [provider]  dapagliflozin propanediol (FARXIGA) 10 MG TABS tablet Take 1 tablet (10 mg total) by mouth daily before breakfast. 06/04/21   [name], MD  doxazosin (CARDURA) 2 MG tablet Take 1 tablet (2 mg total) by mouth 2 (two) times daily. Please keep upcoming appt for future refills 11/18/21   Minna Merritts, MD  ENTRESTO 24-26 MG Take 1 tablet by mouth 2 (two) times daily. 03/09/22   [provider]  ezetimibe (ZETIA) 10 MG tablet TAKE 1 TABLET BY MOUTH DAILY 11/04/21   Minna Merritts, MD  finasteride (PROSCAR) 5 MG tablet Take 1 tablet (5 mg total) by mouth daily. Patient taking differently: Take 5 mg by mouth every evening. 03/11/20   [name], MD  gabapentin (NEURONTIN) 100 MG capsule Take 1 capsule (100 mg total) by mouth 3 (three) times daily. 02/04/22   [name], MD  guaiFENesin-codeine 100-10 MG/5ML syrup Take 10 mLs by mouth 3 (three) times daily as needed for cough.  03/10/22   [name], MD  hydrALAZINE (APRESOLINE) 25 MG tablet Take 1 tablet (25 mg total) by mouth 3 (three) times daily. 10/15/21   [name], MD  insulin glargine, 1 Unit Dial, (TOUJEO) 300 UNIT/ML Solostar Pen Inject 40 units under the skin daily 10/15/21     insulin glargine, 1 Unit Dial, (TOUJEO) 300 UNIT/ML Solostar Pen Inject 40 Units into the skin daily. 02/09/22     isosorbide mononitrate (IMDUR) 30 MG 24 hr tablet Take 1 tablet (30 mg total) by mouth daily. Please call [phone] for a sooner appointment and for further refills. 12/15/21   Theora Gianotti, NP  LAGEVRIO 200 MG CAPS capsule SMARTSIG:4 Capsule(s) By Mouth Every 12 Hours 02/08/22   [provider]  loperamide (IMODIUM A-D) 2 MG tablet Take 1 tablet (2 mg total) by mouth 2 (two) times daily as needed for up to 60 doses for diarrhea or loose stools. Patient taking differently: Take 2 mg by mouth in the morning. 11/13/20   Virgel Manifold, MD  Multiple Vitamins-Minerals  (PRESERVISION AREDS 2 PO) Take 1 tablet by mouth in the morning and at bedtime.    [provider]  omeprazole (PRILOSEC) 20 MG capsule Take 1 capsule (20 mg total) by mouth 2 (two) times daily. 02/04/22   [name], MD  potassium chloride (KLOR-CON) 10 MEQ tablet Take 1 tablet (10 mEq total) by mouth daily as needed. Take with torsemide 12/22/20   Minna Merritts, MD  rosuvastatin (CRESTOR) 20 MG tablet Take 1 tablet (20 mg total) by mouth daily. 01/26/21   Minna Merritts, MD  torsemide (DEMADEX) 20 MG tablet FOR WEIGHT 210-215 TAKE 1 TABLET WITH POTASSIUM  FOR WEIGHT GREATER THAN 215 TAKE 1 TABLET TWICE A DAY (8AM & 2PM) WITH 2 POTASSIUM 12/31/21   [name], MD  TOUJEO SOLOSTAR 300 UNIT/ML Solostar Pen Inject 40 Units into the skin daily. 10/15/21   [name], MD  valsartan (DIOVAN) 320 MG tablet Take 1 tablet (320 mg total) by mouth daily. At night d/c 160s mg 10/06/21   McLean-Scocuzza, Nino Glow, MD    Physical Exam   Triage Vital Signs: ED Triage Vitals  Enc Vitals Group     BP 03/13/22 0009 (!) 123/46     Pulse Rate 03/13/22 0009 69     Resp 03/13/22 0009 18     Temp 03/13/22 0009 98.9 F (37.2 C)     Temp Source 03/13/22 0009 Oral     SpO2 03/13/22 0009 92 %     Weight 03/13/22 0029 213 lb (96.6 kg)     Height 03/13/22 0029 '6\' 1"'$  (1.854 m)     Head Circumference --      Peak Flow --      Pain Score 03/13/22 0028 5     Pain Loc --      Pain Edu? --      Excl. in [place]? --     Most recent vital signs: Vitals:   03/13/22 0520 03/13/22 0530  BP:  (!) 155/62  Pulse:  73  Resp:  16  Temp:    SpO2: 98% 97%    CONSTITUTIONAL: Alert and oriented and responds appropriately to questions. Well-appearing; well-nourished, elderly HEAD: Normocephalic, atraumatic EYES: Conjunctivae clear, pupils appear equal, sclera nonicteric ENT: normal nose; moist mucous membranes NECK: Supple, normal ROM CARD: RRR; S1 and S2 appreciated; no murmurs, no  clicks, no rubs, no gallops RESP: Normal chest  excursion without splinting or tachypnea; breath sounds clear and equal bilaterally; no wheezes, no rhonchi, no rales, no hypoxia or respiratory distress, speaking full sentences ABD/GI: Normal bowel sounds; non-distended; soft, tender to palpation throughout the abdomen without guarding or rebound BACK: The back appears normal EXT: Normal ROM in all joints; no deformity noted, no edema; no cyanosis SKIN: Normal color for age and race; warm; no rash on exposed skin NEURO: Moves all extremities equally, normal speech PSYCH: The patient's mood and manner are appropriate.   ED Results / Procedures / Treatments   LABS: (all labs ordered are listed, but only abnormal results are displayed) Labs Reviewed  RESP PANEL BY RT-PCR (RSV, FLU A&B, COVID)  RVPGX2 - Abnormal; Notable for the following components:      Result Value   Influenza A by PCR POSITIVE (*)    All other components within normal limits  CBC WITH DIFFERENTIAL/PLATELET - Abnormal; Notable for the following components:   RBC 3.39 (*)    Hemoglobin 10.9 (*)    HCT 32.9 (*)    Platelets 90 (*)    Lymphs Abs 0.3 (*)    All other components within normal limits  COMPREHENSIVE METABOLIC PANEL - Abnormal; Notable for the following components:   Glucose, Bld 192 (*)    Creatinine, Ser 1.90 (*)    Calcium 8.5 (*)    GFR, Estimated 34 (*)    All other components within normal limits  URINALYSIS, ROUTINE W REFLEX MICROSCOPIC - Abnormal; Notable for the following components:   Color, Urine YELLOW (*)    APPearance HAZY (*)    Glucose, UA >=500 (*)    Protein, ur 30 (*)    All other components within normal limits  TROPONIN I (HIGH SENSITIVITY) - Abnormal; Notable for the following components:   Troponin I (High Sensitivity) 19 (*)    All other components within normal limits  TROPONIN I (HIGH SENSITIVITY) - Abnormal; Notable for the following components:   Troponin I (High  Sensitivity) 30 (*)    All other components within normal limits  PROCALCITONIN     EKG:  EKG Interpretation  Date/Time:  Sunday March 13 2022 01:02:39 EST Ventricular Rate:  68 PR Interval:  200 QRS Duration: 120 QT Interval:  406 QTC Calculation: 431 R Axis:   70 Text Interpretation: Normal sinus rhythm Right bundle branch block T wave abnormality, consider inferolateral ischemia Abnormal ECG When compared with ECG of 13-Mar-2022 00:45, (unconfirmed) No significant change was found Confirmed by Pryor Curia [phone]) on 03/13/2022 3:25:03 AM         RADIOLOGY: My personal review and interpretation of imaging: CT scan shows viral pattern in his lungs but no acute findings in abdomen pelvis.  I have personally reviewed all radiology reports.   CT CHEST ABDOMEN PELVIS WO CONTRAST  Result Date: 03/13/2022 CLINICAL DATA:  Cough and congestion for multiple weeks. COVID 3 weeks ago. Sepsis. EXAM: CT CHEST, ABDOMEN AND PELVIS WITHOUT CONTRAST TECHNIQUE: Multidetector CT imaging of the chest, abdomen and pelvis was performed following the standard protocol without IV contrast. RADIATION DOSE REDUCTION: This exam was performed according to the departmental dose-optimization program which includes automated exposure control, adjustment of the mA and/or kV according to patient size and/or use of iterative reconstruction technique. COMPARISON:  12/25/2020 abdominal CT FINDINGS: CT CHEST FINDINGS Cardiovascular: Normal heart size. No pericardial effusion. Widespread atherosclerosis post CABG. Mild aortic valve calcification. No acute vascular finding without contrast. Mediastinum/Nodes: Negative for mass or adenopathy. Lungs/Pleura:  There is no edema, consolidation, effusion, or pneumothorax. There are a few areas of peripheral ground-glass density especially in the lower lungs. Generalized airway thickening with patchy air trapping. Musculoskeletal: Advanced thoracic spine degeneration with  multilevel bridging osteophyte. No acute finding. CT ABDOMEN PELVIS FINDINGS Hepatobiliary: No focal liver abnormality.Cholecystectomy Pancreas: Unremarkable. Spleen: Unremarkable. Adrenals/Urinary Tract: Negative adrenals. No hydronephrosis or stone. Small left renal lesions which are cyst by prior enhanced CT, no imaging follow-up recommended. Generalized and symmetric cortical thinning. Unremarkable bladder. Stomach/Bowel:  No obstruction. No visible bowel inflammation. Vascular/Lymphatic: No acute vascular abnormality. Confluent atheromatous calcification of the aorta with extensive branch vessel plaque. No mass or adenopathy. Reproductive:Symmetric enlargement of the prostate, chronic. There is partial obscuration from right hip artifact. Other: No ascites or pneumoperitoneum. Musculoskeletal: Right hip prosthesis with unavoidable streak artifact. Generalized advanced lumbar spine degeneration with multilevel ankylosis. No superimposed acute finding. IMPRESSION: 1. Airway thickening with patchy air trapping and small ground-glass opacities, possible sequela of patient's recent COVID. No consolidating pneumonia. 2. No acute intra-abdominal finding. 3. Chronic findings are described above. Electronically Signed   By: Jorje Guild M.D.   On: 03/13/2022 04:55   DG Chest 2 View  Result Date: 03/13/2022 CLINICAL DATA:  COVID positive 3 weeks ago with chest pain and productive cough. EXAM: CHEST - 2 VIEW COMPARISON:  March 10, 2022 FINDINGS: Multiple sternal wires and vascular clips are noted. The heart size and mediastinal contours are within normal limits. There is marked severity calcification of the aortic arch. Both lungs are clear. The visualized skeletal structures are unremarkable. IMPRESSION: 1. Evidence of prior median sternotomy/CABG. 2. No acute cardiopulmonary disease. Electronically Signed   By: Virgina Norfolk M.D.   On: 03/13/2022 01:09     PROCEDURES:  Critical Care performed: Yes, see  critical care procedure note(s)   CRITICAL CARE Performed by: Cyril Mourning Karenna Romanoff   Total critical care time: 40 minutes  Critical care time was exclusive of separately billable procedures and treating other patients.  Critical care was necessary to treat or prevent imminent or life-threatening deterioration.  Critical care was time spent personally by me on the following activities: development of treatment plan with patient and/or surrogate as well as nursing, discussions with consultants, evaluation of patient's response to treatment, examination of patient, obtaining history from patient or surrogate, ordering and performing treatments and interventions, ordering and review of laboratory studies, ordering and review of radiographic studies, pulse oximetry and re-evaluation of patient's condition.   Marland Kitchen1-3 Lead EKG Interpretation  Performed by: Rhiley Solem, Delice Bison, DO Authorized by: Binta Statzer, Delice Bison, DO     Interpretation: normal     ECG rate:  74   ECG rate assessment: normal     Rhythm: sinus rhythm     Ectopy: none     Conduction: normal       IMPRESSION / MDM / ASSESSMENT AND PLAN / ED COURSE  I reviewed the triage vital signs and the nursing notes.    Patient here with complaints of 3 weeks of cough, and congestion with chest soreness and now having vomiting and diffuse abdominal pain.  The patient is on the cardiac monitor to evaluate for evidence of arrhythmia and/or significant heart rate changes.   DIFFERENTIAL DIAGNOSIS (includes but not limited to):   Viral illness, pneumonia, UTI, colitis, bowel obstruction, appendicitis, diverticulitis, pyelonephritis, less likely ACS or PE   Patient's presentation is most consistent with acute presentation with potential threat to life or bodily function.   PLAN: Workup  initiated from triage.  No leukocytosis.  Stable anemia.  Stable chronic kidney disease.  Normal LFTs.  Troponin minimally elevated at 19.  Second pending.  Urine does  not appear infected.  He is positive for influenza A.  EKG does show some new anterior lateral T wave changes that have not been present on the most recent EKGs but were seen in EKGs in 2022.  Will obtain CT of the chest abdomen pelvis without contrast given GFR of 34.  Will give IV fluids, pain and nausea medicine for symptomatic relief.   MEDICATIONS GIVEN IN ED: Medications  sodium chloride 0.9 % bolus 1,000 mL (0 mLs Intravenous Stopped 03/13/22 0527)  ondansetron (ZOFRAN) injection 4 mg (4 mg Intravenous Given 03/13/22 0404)  fentaNYL (SUBLIMAZE) injection 50 mcg (50 mcg Intravenous Given 03/13/22 0404)     ED COURSE: Second troponin has risen slightly to 30.  Repeat EKG shows no change.  Patient no longer vomiting.  CT scans reviewed and interpreted by myself and the radiologist and shows groundglass opacities consistent with viral illness.  No consolidating pneumonia.  No acute abnormality within the abdomen pelvis.  Patient sats with ambulation were 92% but on my reassessment he is sitting upright awake at the edge of the bed and satting 88% on room air.  Placed on 2 L nasal cannula improved to 95%.  Does not wear oxygen at home.  Will need admission for influenza with new onset oxygen requirement.  Procalcitonin negative.  Low suspicion that this is bacterial PNA.  CONSULTS:  Consulted and discussed patient's case with hospitalist, Dr. Damita Dunnings.  I have recommended admission and consulting physician agrees and will place admission orders.  Patient (and family if present) agree with this plan.   I reviewed all nursing notes, vitals, pertinent previous records.  All labs, EKGs, imaging ordered have been independently reviewed and interpreted by myself.    OUTSIDE RECORDS REVIEWED: Reviewed patient's last PCP note on 03/10/2022.       FINAL CLINICAL IMPRESSION(S) / ED DIAGNOSES   Final diagnoses:  Influenza A  Acute respiratory failure with hypoxia (HCC)  Nausea and vomiting in adult      Rx / DC Orders   ED Discharge Orders     None        Note:  This document was prepared using Dragon voice recognition software and may include unintentional dictation errors.   Rex Oesterle, Delice Bison, DO 03/13/22 [phone]

## 2022-03-13 NOTE — Subjective & Objective (Signed)
[name] [name] is a 89 year old male with history of permanent A-fib, coronary artery disease status post CABG, heart failure with preserved ejection fraction, type 2 diabetes, CKD stage IIIb, hypertension. In November '23 he had a right caudate CVA with minimal sequellae and was able to return home. He subsequently had a bout of Covid 19 for which he was treated. For the past several days he has had a cough with scant sputum production. In the lat 24 hours he has had myalgias, persistent cough, N/V and temperature to 99.9. For his persistent symptoms he presents to [facility] for evaluation.

## 2022-03-13 NOTE — Progress Notes (Signed)
PHARMACY NOTE:  ANTIMICROBIAL RENAL DOSAGE ADJUSTMENT  Current antimicrobial regimen includes a mismatch between antimicrobial dosage and estimated renal function.  As per policy approved by the Pharmacy & Therapeutics and Medical Executive Committees, the antimicrobial dosage will be adjusted accordingly.  Current antimicrobial dosage:  oseltamivir '75mg'$  BID  Indication: Influenza   Renal Function:  Estimated Creatinine Clearance: 34.2 mL/min (A) (by C-G formula based on SCr of 1.9 mg/dL (H)).  Antimicrobial dosage has been changed to:  oseltamivir '30mg'$  BID  Additional comments:   Thank you for allowing pharmacy to be a part of this patient's care.  Pernell Dupre, PharmD, BCPS Clinical Pharmacist 03/13/2022 8:22 AM

## 2022-03-13 NOTE — Assessment & Plan Note (Signed)
Stable - continue home meds

## 2022-03-13 NOTE — H&P (Signed)
History and Physical    [name] [name]:[id number] DOB: [birth year] DOA: 03/13/2022  DOS: the patient was seen and examined on 03/13/2022  PCP: [name], MD   Patient coming from: Home  I have personally briefly reviewed patient's old medical records in [place]  Mr. [name] [name] is a 89 year old male with history of permanent A-fib, coronary artery disease status post CABG, heart failure with preserved ejection fraction, type 2 diabetes, CKD stage IIIb, hypertension. In November '23 he had a right caudate CVA with minimal sequellae and was able to return home. He subsequently had a bout of Covid 19 for which he was treated. For the past several days he has had a cough with scant sputum production. In the lat 24 hours he has had myalgias, persistent cough, N/V and temperature to 99.9. For his persistent symptoms he presents to [facility] for evaluation.     ED Course: T 99.9, 157/73  HR 78  RR 16  O2 sat 96% on 2L [place]. Per EDP exam patient did appear stable. Lab: Covid NEGATIVE, POSITIVE for inlfuenza A, Cr 1.9 ( close to baseline) WBC 7.1 w/ 85/5/9, U/A negative, Troponin 30, glucose 192. TRH called to admit for continued evaluation and tx of influenza with respiratory symptoms.   Review of Systems:  Review of Systems  Constitutional:  Positive for fever and malaise/fatigue. Negative for chills and weight loss.  HENT: Negative.    Eyes: Negative.   Respiratory:  Positive for cough and shortness of breath. Negative for sputum production and wheezing.   Cardiovascular:  Positive for palpitations. Negative for orthopnea and leg swelling.  Gastrointestinal:  Positive for nausea and vomiting.  Genitourinary: Negative.   Musculoskeletal:  Positive for myalgias.  Skin: Negative.   Neurological: Negative.   Endo/Heme/Allergies: Negative.   Psychiatric/Behavioral: Negative.      Past Medical History:  Diagnosis Date   (HFpEF) heart failure with preserved ejection fraction ([place])     a. 03/9620 Echo: nl systolic fxn, mild LVH, diastolic relaxation abnormality, mildly enlarged LA, mild Ao insufficiency; 01/2018 Echo: EF 55-60%, no rwma, GR2 DD, triv AI, mildly dil LA, mild inc PASP.   AMS (altered mental status) 02/03/2021   BPH (benign prostatic hyperplasia)    Cancer ([place])    skin   Cataract    Choledocholithiasis    a. 05/2020 ERCP w/ CBD stone removal and biliary sphincterotomy.   Coronary artery disease    a. 01/2005 s/p CABG x 4: LIMA-LAD, VG-Diag, VG-OM3, VG-dRCA; b. 06/2013 Cath: patent grafts; c. 09/2015 Cath: VG->Diag 100, other grafts patent. LAD 100, RCA sev dzs->Med rx; d. 01/2018 Cath: LAD 100ost/p, LCX 50, RCA 100p/d, VG->OM3 100, VG->Diag 100, LIMA->LAD ok, VG->RPAV 41m->Med rx.   COVID-19    07/2020 had paxlovid    Dyspnea    with exertion   Dysrhythmia    ED (erectile dysfunction)    Elevated LFTs    a. 05/2020 following CBD stone. 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   GERD (gastroesophageal reflux disease)    Hyperlipidemia    Hypertension    IDDM (insulin dependent diabetes mellitus) 2000   Osteoarthritis    PAH (pulmonary artery hypertension) ([place]    a. 01/2018 RHC: PA 51/22 (32).   Perirectal fistula    Pneumonia 12/2016   Shingles 09/04/2018   Tubular adenoma of colon 07/2012   Vertigo     Past Surgical History:  Procedure Laterality Date   BACK  SURGERY     CARDIAC CATHETERIZATION  06/05/2013   [facility]    CARDIAC CATHETERIZATION N/A 09/24/2015   Procedure: Right Heart Cath and Coronary/Graft Angiography;  Surgeon: Minna Merritts, MD;  Location: [place] [facility];  Service: Cardiovascular;  Laterality: N/A;   CATARACT EXTRACTION  sept 2013   right   CATARACT EXTRACTION W/PHACO Left 03/28/2017   Procedure: CATARACT EXTRACTION PHACO AND INTRAOCULAR LENS PLACEMENT (IOC);  Surgeon: Birder Robson, MD;  Location: [facility];  Service: Ophthalmology;  Laterality: Left;  [place] 00:42.0 AP%  15.0 CDE 6.31 Fluid Pack lot # [id number]   CHOLECYSTECTOMY  05/15/2011   Procedure: LAPAROSCOPIC CHOLECYSTECTOMY;  Surgeon: Rolm Bookbinder, MD;  Location: [place];  Service: General;  Laterality: N/A;   COLONOSCOPY W/ POLYPECTOMY     CORONARY ARTERY BYPASS GRAFT  2007   x 4   CORONARY STENT INTERVENTION N/A 12/08/2020   Procedure: CORONARY STENT INTERVENTION;  Surgeon: Nelva Bush, MD;  Location: [place] [facility];  Service: Cardiovascular;  Laterality: N/A;   ERCP N/A 05/05/2020   Procedure: ENDOSCOPIC RETROGRADE CHOLANGIOPANCREATOGRAPHY (ERCP);  Surgeon: Lucilla Lame, MD;  Location: [facility] ENDOSCOPY;  Service: Endoscopy;  Laterality: N/A;   ERCP N/A 05/12/2020   Procedure: ENDOSCOPIC RETROGRADE CHOLANGIOPANCREATOGRAPHY (ERCP);  Surgeon: Lucilla Lame, MD;  Location[facility];  Service: Endoscopy;  Laterality: N/A;   ESOPHAGOGASTRODUODENOSCOPY N/A 05/12/2020   Procedure: ESOPHAGOGASTRODUODENOSCOPY (EGD);  Surgeon: Lucilla Lame, MD;  Location: [facility] ENDOSCOPY;  Service: Endoscopy;  Laterality: N/A;   EYE SURGERY     FOOT SURGERY  2012   right foot   JOINT REPLACEMENT  8/10   Right THR--[name]   LEFT AND RIGHT HEART CATHETERIZATION WITH CORONARY/GRAFT ANGIOGRAM N/A 06/05/2013   Procedure: LEFT AND RIGHT HEART CATHETERIZATION WITH Beatrix Fetters;  Surgeon: Peter M Martinique, MD;  Location: [facility] CATH LAB;  Service: Cardiovascular;  Laterality: N/A;   LUMBAR LAMINECTOMY  1989   Prostate photovaporization  5/16   Dr Budd Palmer   RIGHT/LEFT HEART CATH AND CORONARY/GRAFT ANGIOGRAPHY N/A 01/05/2018   Procedure: RIGHT/LEFT HEART CATH AND CORONARY/GRAFT ANGIOGRAPHY;  Surgeon: Minna Merritts, MD;  Location: [place] [facility];  Service: Cardiovascular;  Laterality: N/A;   RIGHT/LEFT HEART CATH AND CORONARY/GRAFT ANGIOGRAPHY Bilateral 12/08/2020   Procedure: RIGHT/LEFT HEART CATH AND CORONARY/GRAFT ANGIOGRAPHY;  Surgeon: Nelva Bush, MD;  Location: [place] [facility];  Service:  Cardiovascular;  Laterality: Bilateral;    Soc Hx - marriage #3 x 13 years, widowed x 2. He has 4 children and 4 grandchildren 1 great grandchild. 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 He lives with his wife. He rides his bike 4 times a week. Discussed code status - remains full code, but he is referred to [email or web address] to stimulate a conversation with his family and medical care givers.    reports that he has never smoked. He has never used smokeless tobacco. He reports current alcohol use. He reports that he does not use drugs.  Allergies  Allergen Reactions   Cortisone Other (See Comments)    Leg Cramps   Metformin Diarrhea    Family History  Problem Relation Age of Onset   Lung cancer Mother    Heart disease Father    Colon cancer Neg Hx    Breast cancer Neg Hx     Prior to Admission medications   Medication Sig Start Date End Date Taking? Authorizing Provider  amiodarone (  PACERONE) 200 MG tablet Take 200 mg by mouth daily. 01/17/22   [provider]  amoxicillin-clavulanate (AUGMENTIN) 875-125 MG tablet Take 1 tablet by mouth 2 (two) times daily. 03/10/22   [name], MD  apixaban (ELIQUIS) 5 MG TABS tablet Take 5 mg by mouth 2 (two) times daily.    [provider]  BD PEN NEEDLE NANO U/F 32G X 4 MM MISC USE AS DIRECTED 02/16/22   [name], MD  benzonatate (TESSALON) 200 MG capsule Take 1 capsule (200 mg total) by mouth 2 (two) times daily as needed for cough. 02/11/22   [name], MD  carvedilol (COREG) 12.5 MG tablet TAKE ONE (1) TABLET BY MOUTH TWO TIMES PER DAY 02/10/22   Minna Merritts, MD  Cholecalciferol (VITAMIN D3) 50 MCG (2000 UT) TABS Take 2,000 Units by mouth in the morning.    [provider]  dapagliflozin propanediol (FARXIGA) 10 MG TABS tablet Take 1 tablet (10 mg total) by mouth daily before breakfast.  06/04/21   [name], MD  doxazosin (CARDURA) 2 MG tablet Take 1 tablet (2 mg total) by mouth 2 (two) times daily. Please keep upcoming appt for future refills 11/18/21   Minna Merritts, MD  ENTRESTO 24-26 MG Take 1 tablet by mouth 2 (two) times daily. 03/09/22   [provider]  ezetimibe (ZETIA) 10 MG tablet TAKE 1 TABLET BY MOUTH DAILY 11/04/21   Minna Merritts, MD  finasteride (PROSCAR) 5 MG tablet Take 1 tablet (5 mg total) by mouth daily. Patient taking differently: Take 5 mg by mouth every evening. 03/11/20   [name], MD  gabapentin (NEURONTIN) 100 MG capsule Take 1 capsule (100 mg total) by mouth 3 (three) times daily. 02/04/22   [name], MD  guaiFENesin-codeine 100-10 MG/5ML syrup Take 10 mLs by mouth 3 (three) times daily as needed for cough. 03/10/22   [name], MD  hydrALAZINE (APRESOLINE) 25 MG tablet Take 1 tablet (25 mg total) by mouth 3 (three) times daily. 10/15/21   [name], MD  insulin glargine, 1 Unit Dial, (TOUJEO) 300 UNIT/ML Solostar Pen Inject 40 units under the skin daily 10/15/21     insulin glargine, 1 Unit Dial, (TOUJEO) 300 UNIT/ML Solostar Pen Inject 40 Units into the skin daily. 02/09/22     isosorbide mononitrate (IMDUR) 30 MG 24 hr tablet Take 1 tablet (30 mg total) by mouth daily. Please call [phone] for a sooner appointment and for further refills. 12/15/21   Theora Gianotti, NP  LAGEVRIO 200 MG CAPS capsule SMARTSIG:4 Capsule(s) By Mouth Every 12 Hours 02/08/22   [provider]  loperamide (IMODIUM A-D) 2 MG tablet Take 1 tablet (2 mg total) by mouth 2 (two) times daily as needed for up to 60 doses for diarrhea or loose stools. Patient taking differently: Take 2 mg by mouth in the morning. 11/13/20   Virgel Manifold, MD  Multiple Vitamins-Minerals (PRESERVISION AREDS 2 PO) Take 1 tablet by mouth in the morning and at bedtime.    [provider]  omeprazole (PRILOSEC) 20 MG  capsule Take 1 capsule (20 mg total) by mouth 2 (two) times daily. 02/04/22   [name], MD  potassium chloride (KLOR-CON) 10 MEQ tablet Take 1 tablet (10 mEq total) by mouth daily as needed. Take with torsemide 12/22/20   Minna Merritts, MD  rosuvastatin (CRESTOR) 20 MG tablet Take 1 tablet (20 mg total) by mouth  daily. 01/26/21   Minna Merritts, MD  torsemide (DEMADEX) 20 MG tablet FOR WEIGHT 210-215 TAKE 1 TABLET WITH POTASSIUM  FOR WEIGHT GREATER THAN 215 TAKE 1 TABLET TWICE A DAY (8AM & 2PM) WITH 2 POTASSIUM 12/31/21   [name], MD  TOUJEO SOLOSTAR 300 UNIT/ML Solostar Pen Inject 40 Units into the skin daily. 10/15/21   [name], MD  valsartan (DIOVAN) 320 MG tablet Take 1 tablet (320 mg total) by mouth daily. At night d/c 160s mg 10/06/21   McLean-Scocuzza, Nino Glow, MD    Physical Exam: Vitals:   03/13/22 0520 03/13/22 0530 03/13/22 0630 03/13/22 0709  BP:  (!) 155/62 (!) 164/53 (!) 157/73  Pulse:  73 82 78  Resp:  16 16   Temp:    99.9 F (37.7 C)  TempSrc:    Oral  SpO2: 98% 97% 96% 95%  Weight:      Height:        Physical Exam Vitals and nursing note reviewed.  Constitutional:      Appearance: Normal appearance.     Comments: overweight  HENT:     Head: Normocephalic and atraumatic.     Nose: Nose normal.     Mouth/Throat:     Mouth: Mucous membranes are moist.     Pharynx: Oropharynx is clear. No oropharyngeal exudate or posterior oropharyngeal erythema.  Eyes:     General: No scleral icterus.    Extraocular Movements: Extraocular movements intact.     Conjunctiva/sclera: Conjunctivae normal.     Pupils: Pupils are equal, round, and reactive to light.  Neck:     Vascular: No carotid bruit.  Cardiovascular:     Rate and Rhythm: Normal rate. Rhythm irregular.     Pulses: Normal pulses.     Heart sounds: Murmur heard.     Comments: II/VI systolic mm best ar LSB Pulmonary:     Effort: Pulmonary effort is normal. No respiratory  distress.     Breath sounds: Normal breath sounds. No wheezing or rales.  Abdominal:     Palpations: Abdomen is soft.     Tenderness: There is no guarding or rebound.     Comments: Hypoactive BS  Musculoskeletal:        General: Normal range of motion.     Cervical back: Normal range of motion.     Right lower leg: No edema.     Left lower leg: No edema.  Lymphadenopathy:     Cervical: No cervical adenopathy.  Skin:    General: Skin is warm and dry.  Neurological:     General: No focal deficit present.     Mental Status: He is alert and oriented to person, place, and time. Mental status is at baseline.     Cranial Nerves: No cranial nerve deficit.  Psychiatric:        Mood and Affect: Mood normal.        Behavior: Behavior normal.      Labs on Admission: I have personally reviewed following labs and imaging studies  CBC: Recent Labs  Lab 03/13/22 0034  WBC 7.1  NEUTROABS 6.1  HGB 10.9*  HCT 32.9*  MCV 97.1  PLT 90*   Basic Metabolic Panel: Recent Labs  Lab 03/13/22 0034  NA 137  K 4.0  CL 106  CO2 22  GLUCOSE 192*  BUN 22  CREATININE 1.90*  CALCIUM 8.5*   GFR: Estimated Creatinine Clearance: 34.2 mL/min (A) (by C-G formula based on  SCr of 1.9 mg/dL (H)). Liver Function Tests: Recent Labs  Lab 03/13/22 0034  AST 31  ALT 20  ALKPHOS 117  BILITOT 1.2  PROT 6.6  ALBUMIN 3.6   No results for input(s): "LIPASE", "AMYLASE" in the last 168 hours. No results for input(s): "AMMONIA" in the last 168 hours. Coagulation Profile: No results for input(s): "INR", "PROTIME" in the last 168 hours. Cardiac Enzymes: No results for input(s): "CKTOTAL", "CKMB", "CKMBINDEX", "TROPONINI" in the last 168 hours. BNP (last 3 results) No results for input(s): "PROBNP" in the last 8760 hours. HbA1C: No results for input(s): "HGBA1C" in the last 72 hours. CBG: No results for input(s): "GLUCAP" in the last 168 hours. Lipid Profile: No results for input(s): "CHOL", "HDL",  "LDLCALC", "TRIG", "CHOLHDL", "LDLDIRECT" in the last 72 hours. Thyroid Function Tests: No results for input(s): "TSH", "T4TOTAL", "FREET4", "T3FREE", "THYROIDAB" in the last 72 hours. Anemia Panel: No results for input(s): "VITAMINB12", "FOLATE", "FERRITIN", "TIBC", "IRON", "RETICCTPCT" in the last 72 hours. Urine analysis:    Component Value Date/Time   COLORURINE YELLOW (A) 03/13/2022 0310   APPEARANCEUR HAZY (A) 03/13/2022 0310   APPEARANCEUR Clear 12/27/2017 0943   LABSPEC 1.023 03/13/2022 0310   LABSPEC 1.018 02/22/2013 2122   PHURINE 5.0 03/13/2022 0310   GLUCOSEU >=500 (A) 03/13/2022 0310   GLUCOSEU Negative 02/22/2013 2122   HGBUR NEGATIVE 03/13/2022 0310   BILIRUBINUR NEGATIVE 03/13/2022 0310   BILIRUBINUR small 12/07/2018 1118   BILIRUBINUR Negative 12/27/2017 0943   BILIRUBINUR Negative 02/22/2013 2122   KETONESUR NEGATIVE 03/13/2022 0310   PROTEINUR 30 (A) 03/13/2022 0310   UROBILINOGEN 0.2 12/07/2018 1118   UROBILINOGEN 0.2 06/03/2013 2218   NITRITE NEGATIVE 03/13/2022 0310   LEUKOCYTESUR NEGATIVE 03/13/2022 0310   LEUKOCYTESUR Negative 02/22/2013 2122    Radiological Exams on Admission: I have personally reviewed images CT CHEST ABDOMEN PELVIS WO CONTRAST  Result Date: 03/13/2022 CLINICAL DATA:  Cough and congestion for multiple weeks. COVID 3 weeks ago. Sepsis. EXAM: CT CHEST, ABDOMEN AND PELVIS WITHOUT CONTRAST TECHNIQUE: Multidetector CT imaging of the chest, abdomen and pelvis was performed following the standard protocol without IV contrast. RADIATION DOSE REDUCTION: This exam was performed according to the departmental dose-optimization program which includes automated exposure control, adjustment of the mA and/or kV according to patient size and/or use of iterative reconstruction technique. COMPARISON:  12/25/2020 abdominal CT FINDINGS: CT CHEST FINDINGS Cardiovascular: Normal heart size. No pericardial effusion. Widespread atherosclerosis post CABG. Mild aortic  valve calcification. No acute vascular finding without contrast. Mediastinum/Nodes: Negative for mass or adenopathy. Lungs/Pleura: There is no edema, consolidation, effusion, or pneumothorax. There are a few areas of peripheral ground-glass density especially in the lower lungs. Generalized airway thickening with patchy air trapping. Musculoskeletal: Advanced thoracic spine degeneration with multilevel bridging osteophyte. No acute finding. CT ABDOMEN PELVIS FINDINGS Hepatobiliary: No focal liver abnormality.Cholecystectomy Pancreas: Unremarkable. Spleen: Unremarkable. Adrenals/Urinary Tract: Negative adrenals. No hydronephrosis or stone. Small left renal lesions which are cyst by prior enhanced CT, no imaging follow-up recommended. Generalized and symmetric cortical thinning. Unremarkable bladder. Stomach/Bowel:  No obstruction. No visible bowel inflammation. Vascular/Lymphatic: No acute vascular abnormality. Confluent atheromatous calcification of the aorta with extensive branch vessel plaque. No mass or adenopathy. Reproductive:Symmetric enlargement of the prostate, chronic. There is partial obscuration from right hip artifact. Other: No ascites or pneumoperitoneum. Musculoskeletal: Right hip prosthesis with unavoidable streak artifact. Generalized advanced lumbar spine degeneration with multilevel ankylosis. No superimposed acute finding. IMPRESSION: 1. Airway thickening with patchy air trapping and small ground-glass  opacities, possible sequela of patient's recent COVID. No consolidating pneumonia. 2. No acute intra-abdominal finding. 3. Chronic findings are described above. Electronically Signed   By: Jorje Guild M.D.   On: 03/13/2022 04:55   DG Chest 2 View  Result Date: 03/13/2022 CLINICAL DATA:  COVID positive 3 weeks ago with chest pain and productive cough. EXAM: CHEST - 2 VIEW COMPARISON:  March 10, 2022 FINDINGS: Multiple sternal wires and vascular clips are noted. The heart size and mediastinal  contours are within normal limits. There is marked severity calcification of the aortic arch. Both lungs are clear. The visualized skeletal structures are unremarkable. IMPRESSION: 1. Evidence of prior median sternotomy/CABG. 2. No acute cardiopulmonary disease. Electronically Signed   By: Virgina Norfolk M.D.   On: 03/13/2022 01:09    EKG: I have personally reviewed EKG: NSR, RBBB, old inferolateral injury pattern  Assessment/Plan Principal Problem:   Influenza A Active Problems:   Chronic diastolic CHF (congestive heart failure) (HCC)   DM2 (diabetes mellitus, type 2) (HCC)   Atrial fibrillation, chronic (HCC)   Hyperlipidemia   GERD (gastroesophageal reflux disease)    Assessment and Plan: * Influenza A Patient with presentation of myalgias, persistent cough, fever, N/V and a respiratory panel was positive for influenza A. At presentation he was hypoxemic and required oxygen supplementation.  Plan Med-surg obs admit  Tamiflu 75 mg BID  O2 to keep sats > 90%  Supportive care: APAP, zofran  Atrial fibrillation, chronic (HCC) Patient with stable persistent atrial fibrillation. He does take Eliquis. Other cardiac issue include CAD s/p CABG and PCI, HFpEF and HTN. He has been well managed medically and has controlled rate at admission and no chest pain.  Plan Continue all home cardiac medications  Continue Eliquis  Lab: BNP  DM2 (diabetes mellitus, type 2) (HCC) Lat A1C from July '23 was 7.9 % He reports occasional low CBG readings on his current regimen. He reports that his PCP plans to refer him to endocrinology for consultation.  Plan Continue home regimen  Sliding scale coverage  Chronic diastolic CHF (congestive heart failure) ([place]) Patient appears well compensated. Last ECHO November '23 revealed nl EF and diastolic parameters.   Plan Continue all home meds  Very gently hydration.  Administer torsemide daily while receiving IV hydration  GERD (gastroesophageal reflux  disease) Patient with N/V related to influenza. Denies active reflux or GI pain.  Plan Continue PPI tx.  Hyperlipidemia Stable - continue home meds       DVT prophylaxis: Eliquis Code Status: Full Code discussed with patient. Referred him to "[email or web address]" Family Communication: spoke with Mal Misty, SO - advised her to get a prescription for Tamiflu 75 mg daily x 7 days.  Disposition Plan: home 24-48 hrs  Consults called: none  Admission status: Observation, Med-Surg   Adella Hare, MD [facility] 03/13/2022, 8:27 AM

## 2022-03-13 NOTE — Assessment & Plan Note (Signed)
Patient with presentation of myalgias, persistent cough, fever, N/V and a respiratory panel was positive for influenza A. At presentation he was hypoxemic and required oxygen supplementation.  Plan Med-surg obs admit  Tamiflu 75 mg BID  O2 to keep sats > 90%  Supportive care: APAP, zofran

## 2022-03-14 ENCOUNTER — Encounter: Payer: Self-pay | Admitting: Student in an Organized Health Care Education/Training Program

## 2022-03-14 DIAGNOSIS — N179 Acute kidney failure, unspecified: Secondary | ICD-10-CM

## 2022-03-14 DIAGNOSIS — R112 Nausea with vomiting, unspecified: Secondary | ICD-10-CM

## 2022-03-14 DIAGNOSIS — J101 Influenza due to other identified influenza virus with other respiratory manifestations: Secondary | ICD-10-CM | POA: Diagnosis not present

## 2022-03-14 DIAGNOSIS — J9601 Acute respiratory failure with hypoxia: Secondary | ICD-10-CM

## 2022-03-14 LAB — BASIC METABOLIC PANEL
Anion gap: 7 (ref 5–15)
BUN: 33 mg/dL — ABNORMAL HIGH (ref 8–23)
CO2: 22 mmol/L (ref 22–32)
Calcium: 8 mg/dL — ABNORMAL LOW (ref 8.9–10.3)
Chloride: 105 mmol/L (ref 98–111)
Creatinine, Ser: 2.29 mg/dL — ABNORMAL HIGH (ref 0.61–1.24)
GFR, Estimated: 27 mL/min — ABNORMAL LOW (ref >60)
Glucose, Bld: 106 mg/dL — ABNORMAL HIGH (ref 70–99)
Potassium: 4 mmol/L (ref 3.5–5.1)
Sodium: 134 mmol/L — ABNORMAL LOW (ref 135–145)

## 2022-03-14 LAB — CBC
HCT: 32.9 % — ABNORMAL LOW (ref 39.0–52.0)
Hemoglobin: 10.9 g/dL — ABNORMAL LOW (ref 13.0–17.0)
MCH: 32.4 pg (ref 26.0–34.0)
MCHC: 33.1 g/dL (ref 30.0–36.0)
MCV: 97.9 fL (ref 80.0–100.0)
Platelets: 85 10*3/uL — ABNORMAL LOW (ref 150–400)
RBC: 3.36 MIL/uL — ABNORMAL LOW (ref 4.22–5.81)
RDW: 14.1 % (ref 11.5–15.5)
WBC: 5 10*3/uL (ref 4.0–10.5)
nRBC: 0 % (ref 0.0–0.2)

## 2022-03-14 LAB — GLUCOSE, CAPILLARY
Glucose-Capillary: 111 mg/dL — ABNORMAL HIGH (ref 70–99)
Glucose-Capillary: 209 mg/dL — ABNORMAL HIGH (ref 70–99)

## 2022-03-14 LAB — CBG MONITORING, ED
Glucose-Capillary: 109 mg/dL — ABNORMAL HIGH (ref 70–99)
Glucose-Capillary: 165 mg/dL — ABNORMAL HIGH (ref 70–99)

## 2022-03-14 MED ORDER — OSELTAMIVIR PHOSPHATE 30 MG PO CAPS
30.0000 mg | ORAL_CAPSULE | Freq: Every day | ORAL | Status: AC
  Administered 2022-03-15 – 2022-03-17 (×3): 30 mg via ORAL
  Filled 2022-03-14 (×3): qty 1

## 2022-03-14 MED ORDER — APIXABAN 2.5 MG PO TABS
2.5000 mg | ORAL_TABLET | Freq: Two times a day (BID) | ORAL | Status: DC
  Administered 2022-03-14 – 2022-03-17 (×6): 2.5 mg via ORAL
  Filled 2022-03-14 (×6): qty 1

## 2022-03-14 NOTE — ED Notes (Signed)
Pt given snack at this time  

## 2022-03-14 NOTE — ED Notes (Signed)
Informed RN bed assigned 

## 2022-03-14 NOTE — ED Notes (Signed)
Pt denies needs at this time.  

## 2022-03-14 NOTE — Progress Notes (Signed)
PROGRESS NOTE  [name] [name]    DOB: [birth year], 89 y.o.  GGY:[id number]    Code Status: Full Code   DOA: 03/13/2022   LOS: 1   Brief hospital course  [name] [name] is a 89 y.o. male with a PMH significant for permanent A-fib, coronary artery disease status post CABG, heart failure with preserved ejection fraction, type 2 diabetes, CKD stage IIIb, hypertension. In November '23 he had a right caudate CVA with minimal sequellae and was able to return home. He subsequently had a bout of Covid 19 for which he was treated.  They presented from home to the ED on 03/13/2022 with cough x several days. He also had myalgias, fever, N/V.  In the ED, it was found that they had T 99.9, 157/73 HR 78 RR 16 O2 sat 96% on 2L [place].  Significant findings included Covid NEGATIVE, POSITIVE for inlfuenza A, Cr 1.9 ( close to baseline) WBC 7.1 w/ 85/5/9, U/A negative, Troponin 30, glucose 192. Chest CT showed:  Airway thickening with patchy air trapping and small ground-glass opacities, possible sequela of patient's recent COVID. No consolidating pneumonia  They were initially treated with tamiflu, IV fluids, tylenol, and home medications. He required up to 2L [place].    Patient was admitted to medicine service for further workup and management of influenza A as outlined in detail below.  03/14/22 -stable, improved.  Assessment & Plan  Principal Problem:   Influenza A Active Problems:   Chronic diastolic CHF (congestive heart failure) (HCC)   DM2 (diabetes mellitus, type 2) (HCC)   Atrial fibrillation, chronic (HCC)   Hyperlipidemia   GERD (gastroesophageal reflux disease)  Influenza A Patient with presentation of myalgias, persistent cough, fever, N/V and a respiratory panel was positive for influenza A. At presentation he was hypoxemic and required oxygen supplementation. Patient was stable ORA during evaluation but had some desaturations to high 80s while conversing so recommended reapplying oxygen.  Airways appears clear on exam.  - will need evaluation of ambulation with pulse ox prior to dc.  - continue Tamiflu 75 mg BID - tylenol, zofran, mucinex, honey PRN   Afib  CAD s/p CABG and PCI, HFpEF and HTN-  Patient with stable persistent atrial fibrillation. He has been well managed medically and has controlled rate at admission and no chest pain. - Continue all home cardiac medications including Eliquis   DM2- Lat A1C from July '23 was 7.9 % He reports occasional low CBG readings on his current regimen. He reports that his PCP plans to refer him to endocrinology for consultation. -Continue home regimen - Sliding scale coverage   AKI- likely in relation to dehydration in acute illness setting.  - encourage PO hydration. IV fluids have been discontinued in setting of HF.  - BMP am.    GERD (gastroesophageal reflux disease) Patient with N/V related to influenza. Denies active reflux or GI pain. - Continue PPI tx.   Hyperlipidemia Stable - continue home meds  Body mass index is 28.1 kg/m.  VTE ppx:  apixaban (ELIQUIS) tablet 5 mg   Diet:     Diet   Diet heart healthy/carb modified Room service appropriate? Yes; Fluid consistency: Thin   Consultants: None   Subjective 03/14/22    Pt reports feeling much improved. Endorses ongoing cough. No dyspnea while resting. Has not been up to walk yet to see how his breathing is while exerting himself. Denies LE swelling.    Objective   Vitals:   03/14/22 0400  03/14/22 0430 03/14/22 0500 03/14/22 0600  BP: (!) 108/47 (!) 104/45 (!) 120/50 (!) 129/55  Pulse: 62 62 60 62  Resp: (!) 23 (!) 21 20 (!) 24  Temp:      TempSrc:      SpO2: 95% 95% 95% 96%  Weight:      Height:        Intake/Output Summary (Last 24 hours) at 03/14/2022 0748 Last data filed at 03/14/2022 0503 Gross per 24 hour  Intake --  Output 200 ml  Net -200 ml   Filed Weights   03/13/22 0029  Weight: 96.6 kg     Physical Exam:  General: awake, alert,  NAD HEENT: atraumatic, clear conjunctiva, anicteric sclera, MMM, hearing grossly normal Respiratory: normal respiratory effort. CTAB Cardiovascular: quick capillary refill, normal S1/S2, RRR, no JVD, murmurs Nervous: A&O x3. no gross focal neurologic deficits, normal speech Extremities: moves all equally, no edema, normal tone Skin: dry, intact, normal temperature, normal color. No rashes, lesions or ulcers on exposed skin Psychiatry: normal mood, congruent affect  Labs   I have personally reviewed the following labs and imaging studies CBC    Component Value Date/Time   WBC 7.1 03/13/2022 0034   RBC 3.39 (L) 03/13/2022 0034   HGB 10.9 (L) 03/13/2022 0034   HGB 12.1 (L) 12/03/2020 1414   HCT 32.9 (L) 03/13/2022 0034   HCT 36.3 (L) 12/03/2020 1414   PLT 90 (L) 03/13/2022 0034   PLT 95 (LL) 12/03/2020 1414   MCV 97.1 03/13/2022 0034   MCV 95 12/03/2020 1414   MCV 92 02/22/2013 1654   MCH 32.2 03/13/2022 0034   MCHC 33.1 03/13/2022 0034   RDW 13.5 03/13/2022 0034   RDW 12.5 12/03/2020 1414   RDW 13.3 02/22/2013 1654   LYMPHSABS 0.3 (L) 03/13/2022 0034   MONOABS 0.6 03/13/2022 0034   EOSABS 0.1 03/13/2022 0034   BASOSABS 0.0 03/13/2022 0034      Latest Ref Rng & Units 03/13/2022   12:34 AM 01/31/2022    5:32 AM 01/30/2022    5:19 AM  BMP  Glucose 70 - 99 mg/dL 192  156  79   BUN 8 - 23 mg/dL '22  21  22   '$ Creatinine 0.61 - 1.24 mg/dL 1.90  1.79  1.66   Sodium 135 - 145 mmol/L 137  140  141   Potassium 3.5 - 5.1 mmol/L 4.0  4.3  4.2   Chloride 98 - 111 mmol/L 106  115  116   CO2 22 - 32 mmol/L '22  21  22   '$ Calcium 8.9 - 10.3 mg/dL 8.5  8.6  8.5     CT CHEST ABDOMEN PELVIS WO CONTRAST  Result Date: 03/13/2022 CLINICAL DATA:  Cough and congestion for multiple weeks. COVID 3 weeks ago. Sepsis. EXAM: CT CHEST, ABDOMEN AND PELVIS WITHOUT CONTRAST TECHNIQUE: Multidetector CT imaging of the chest, abdomen and pelvis was performed following the standard protocol without IV  contrast. RADIATION DOSE REDUCTION: This exam was performed according to the departmental dose-optimization program which includes automated exposure control, adjustment of the mA and/or kV according to patient size and/or use of iterative reconstruction technique. COMPARISON:  12/25/2020 abdominal CT FINDINGS: CT CHEST FINDINGS Cardiovascular: Normal heart size. No pericardial effusion. Widespread atherosclerosis post CABG. Mild aortic valve calcification. No acute vascular finding without contrast. Mediastinum/Nodes: Negative for mass or adenopathy. Lungs/Pleura: There is no edema, consolidation, effusion, or pneumothorax. There are a few areas of peripheral ground-glass density especially in the  lower lungs. Generalized airway thickening with patchy air trapping. Musculoskeletal: Advanced thoracic spine degeneration with multilevel bridging osteophyte. No acute finding. CT ABDOMEN PELVIS FINDINGS Hepatobiliary: No focal liver abnormality.Cholecystectomy Pancreas: Unremarkable. Spleen: Unremarkable. Adrenals/Urinary Tract: Negative adrenals. No hydronephrosis or stone. Small left renal lesions which are cyst by prior enhanced CT, no imaging follow-up recommended. Generalized and symmetric cortical thinning. Unremarkable bladder. Stomach/Bowel:  No obstruction. No visible bowel inflammation. Vascular/Lymphatic: No acute vascular abnormality. Confluent atheromatous calcification of the aorta with extensive branch vessel plaque. No mass or adenopathy. Reproductive:Symmetric enlargement of the prostate, chronic. There is partial obscuration from right hip artifact. Other: No ascites or pneumoperitoneum. Musculoskeletal: Right hip prosthesis with unavoidable streak artifact. Generalized advanced lumbar spine degeneration with multilevel ankylosis. No superimposed acute finding. IMPRESSION: 1. Airway thickening with patchy air trapping and small ground-glass opacities, possible sequela of patient's recent COVID. No  consolidating pneumonia. 2. No acute intra-abdominal finding. 3. Chronic findings are described above. Electronically Signed   By: Jorje Guild M.D.   On: 03/13/2022 04:55   DG Chest 2 View  Result Date: 03/13/2022 CLINICAL DATA:  COVID positive 3 weeks ago with chest pain and productive cough. EXAM: CHEST - 2 VIEW COMPARISON:  March 10, 2022 FINDINGS: Multiple sternal wires and vascular clips are noted. The heart size and mediastinal contours are within normal limits. There is marked severity calcification of the aortic arch. Both lungs are clear. The visualized skeletal structures are unremarkable. IMPRESSION: 1. Evidence of prior median sternotomy/CABG. 2. No acute cardiopulmonary disease. Electronically Signed   By: Virgina Norfolk M.D.   On: 03/13/2022 01:09    Disposition Plan & Communication  Patient status: Inpatient  Admitted From: Home Planned disposition location: Home Anticipated discharge date: 1/9 pending resolution of AKI and maintain O2 saturations while ambulating  Family Communication: wife at bedside    Author: Richarda Osmond, DO [facility] 03/14/2022, 7:48 AM   Available by Epic secure chat 7AM-7PM. If 7PM-7AM, please contact night-coverage.  [facility] contact information found on [email or web address].

## 2022-03-14 NOTE — ED Notes (Signed)
Pt given water, no other needs at this time

## 2022-03-15 DIAGNOSIS — N179 Acute kidney failure, unspecified: Secondary | ICD-10-CM | POA: Diagnosis not present

## 2022-03-15 DIAGNOSIS — R112 Nausea with vomiting, unspecified: Secondary | ICD-10-CM | POA: Diagnosis not present

## 2022-03-15 DIAGNOSIS — J101 Influenza due to other identified influenza virus with other respiratory manifestations: Secondary | ICD-10-CM | POA: Diagnosis not present

## 2022-03-15 DIAGNOSIS — J9601 Acute respiratory failure with hypoxia: Secondary | ICD-10-CM | POA: Diagnosis not present

## 2022-03-15 LAB — BASIC METABOLIC PANEL
Anion gap: 8 (ref 5–15)
BUN: 41 mg/dL — ABNORMAL HIGH (ref 8–23)
CO2: 22 mmol/L (ref 22–32)
Calcium: 7.9 mg/dL — ABNORMAL LOW (ref 8.9–10.3)
Chloride: 104 mmol/L (ref 98–111)
Creatinine, Ser: 2.5 mg/dL — ABNORMAL HIGH (ref 0.61–1.24)
GFR, Estimated: 24 mL/min — ABNORMAL LOW (ref >60)
Glucose, Bld: 124 mg/dL — ABNORMAL HIGH (ref 70–99)
Potassium: 3.8 mmol/L (ref 3.5–5.1)
Sodium: 134 mmol/L — ABNORMAL LOW (ref 135–145)

## 2022-03-15 LAB — CBC
HCT: 32.7 % — ABNORMAL LOW (ref 39.0–52.0)
Hemoglobin: 10.9 g/dL — ABNORMAL LOW (ref 13.0–17.0)
MCH: 31.7 pg (ref 26.0–34.0)
MCHC: 33.3 g/dL (ref 30.0–36.0)
MCV: 95.1 fL (ref 80.0–100.0)
Platelets: 80 10*3/uL — ABNORMAL LOW (ref 150–400)
RBC: 3.44 MIL/uL — ABNORMAL LOW (ref 4.22–5.81)
RDW: 13.8 % (ref 11.5–15.5)
WBC: 5.1 10*3/uL (ref 4.0–10.5)
nRBC: 0 % (ref 0.0–0.2)

## 2022-03-15 LAB — GLUCOSE, CAPILLARY
Glucose-Capillary: 128 mg/dL — ABNORMAL HIGH (ref 70–99)
Glucose-Capillary: 164 mg/dL — ABNORMAL HIGH (ref 70–99)
Glucose-Capillary: 183 mg/dL — ABNORMAL HIGH (ref 70–99)
Glucose-Capillary: 214 mg/dL — ABNORMAL HIGH (ref 70–99)

## 2022-03-15 MED ORDER — LACTATED RINGERS IV SOLN: INTRAVENOUS | Status: AC

## 2022-03-15 NOTE — TOC Initial Note (Signed)
Transition of Care [facility]) - Initial/Assessment Note    Patient Details  Name: [name] [name] MRN: [id number] Date of Birth: 21-Sep-1933  Transition of [facility]) CM/SW Contact:    Gerilyn Pilgrim, LCSW Phone Number: 03/15/2022, 10:15 AM  Clinical Narrative:   Readmission risk assessment complete. Pt sees Dr. Josephina Gip for primary care and lives with wife. Pt had HH in past with [facility] but was just recently discharged on 11/27.  Pt reports no transportation barriers and no additional needs at this time.                       Patient Goals and CMS Choice            Expected Discharge Plan and Services                                              Prior Living Arrangements/Services                       Activities of Daily Living Home Assistive Devices/Equipment: Walker (specify type), Cane (specify quad or straight), Wheelchair, Eyeglasses, Dentures (specify type), Scales ADL Screening (condition at time of admission) Patient's cognitive ability adequate to safely complete daily activities?: Yes Is the patient deaf or have difficulty hearing?: No Does the patient have difficulty seeing, even when wearing glasses/contacts?: No Does the patient have difficulty concentrating, remembering, or making decisions?: No Patient able to express need for assistance with ADLs?: Yes Does the patient have difficulty dressing or bathing?: No Independently performs ADLs?: Yes (appropriate for developmental age) Does the patient have difficulty walking or climbing stairs?: No Weakness of Legs: None Weakness of Arms/Hands: None  Permission Sought/Granted                  Emotional Assessment              Admission diagnosis:  Influenza A [J10.1] Acute respiratory failure with hypoxia (HCC) [J96.01] Nausea and vomiting in adult [R11.2] Patient Active Problem List   Diagnosis Date Noted   Acute respiratory failure with hypoxia ([place]) 03/14/2022   Nausea  and vomiting in adult 03/14/2022   Influenza A 03/13/2022   Bronchitis 03/10/2022   Laceration of right hand without foreign body 02/07/2022   Acute CVA (cerebrovascular accident) ([place]) 01/26/2022   Hand crush injury, right, sequela 01/26/2022   Atrial fibrillation, chronic ([place]) 01/26/2022   Atrial flutter ([place]) 11/05/2021   Chronic anticoagulation 11/05/2021   Left leg swelling 06/09/2021   Acute cough 04/08/2021   Weakness 02/03/2021   Acute on chronic heart failure with preserved ejection fraction (HFpEF) ([place]) 12/09/2020   De Quervain's tenosynovitis, left 10/05/2020   Abnormal MRI, lumbar spine 08/12/2020   History of COVID-19 07/22/2020   AKI (acute kidney injury) ([place]) 04/08/2020   Scrotal lesion 03/12/2020   Cellulitis 05/12/2019   Epigastric pain 05/09/2019   Postherpetic neuralgia 05/09/2019   B12 deficiency 05/09/2019   Pulmonary hypertension, unspecified ([place]) 04/05/2019   Proteinuria, unspecified 04/05/2019   Atypical chest pain 01/08/2019   Weight loss 12/01/2018   Vertigo 10/15/2018   Diabetic retinopathy ([place]) 09/20/2018   Chronic low back pain 03/05/2018   Abnormal breast exam 01/18/2018   PAC (premature atrial contraction) 01/18/2018   Neuropathic pain 01/18/2018   Acute kidney injury superimposed on chronic kidney disease ([place]) 01/07/2018  Bell's palsy 08/18/2017   H/O cold sores 08/18/2017   Insomnia 11/10/2016   PLMD (periodic limb movement disorder) 08/17/2016   Mild OSA 08/17/2016   Intolerance of continuous positive airway pressure (CPAP) ventilation 08/17/2016   Chronic arthritis 06/02/2016   CKD stage 3 due to type 2 diabetes mellitus ([place]) 18/56/3149   Diastolic dysfunction 70/26/3785   Presence of aortocoronary bypass graft 05/04/2016   Chronic pain of both knees 10/06/2015   Sensory abnormality of thoracic dermatome distribution 10/06/2015   Spondylosis of lumbar region without myelopathy or radiculopathy 10/06/2015   S/P CABG x 4     Shortness of breath 05/14/2015   Thrombocytopenia ([place]) 02/07/2015   DM2 (diabetes mellitus, type 2) ([place]) 02/06/2015   Chronic diastolic CHF (congestive heart failure) ([place]) 01/01/2014   CAD (coronary artery disease) 06/03/2013   Diabetic neuropathy ([place]) 11/12/2012   BPH (benign prostatic hyperplasia)    GERD (gastroesophageal reflux disease)    Hyperlipidemia 09/15/2009   Essential hypertension 09/15/2009   PCP:  [name], MD Pharmacy:   [place], [place] - [address] [place] [address] Phone: [phone] Fax: [phone]     Social Determinants of Health (SDOH) Social History: SDOH Screenings   Food Insecurity: No Food Insecurity (03/14/2022)  Housing: Low Risk  (03/14/2022)  Transportation Needs: No Transportation Needs (03/14/2022)  Utilities: Not At Risk (03/14/2022)  Depression (PHQ2-9): Low Risk  (02/04/2022)  Financial Resource Strain: Low Risk  (07/07/2021)  Physical Activity: Sufficiently Active (07/03/2020)  Social Connections: Unknown (07/07/2021)  Stress: No Stress Concern Present (07/07/2021)  Tobacco Use: Low Risk  (03/14/2022)   SDOH Interventions:     Readmission Risk Interventions    03/15/2022   10:14 AM  Readmission Risk Prevention Plan  Transportation Screening Complete  PCP or Specialist Appt within 3-5 Days Complete  Medication Review Press photographer) Complete

## 2022-03-15 NOTE — Progress Notes (Addendum)
PROGRESS NOTE  [name] [name]    DOB: 03/24/1933, 89 y.o.  UXL:[id number]    Code Status: Full Code   DOA: 03/13/2022   LOS: 2   Brief hospital course  [name] [name] is a 88 y.o. male with a PMH significant for permanent A-fib, coronary artery disease status post CABG, heart failure with preserved ejection fraction, type 2 diabetes, CKD stage IIIb, hypertension. In November '23 he had a right caudate CVA with minimal sequellae and was able to return home. He subsequently had a bout of Covid 19 for which he was treated.  They presented from home to the ED on 03/13/2022 with cough x several days. He also had myalgias, fever, N/V.  In the ED, it was found that they had T 99.9, 157/73 HR 78 RR 16 O2 sat 96% on 2L [place].  Significant findings included Covid NEGATIVE, POSITIVE for inlfuenza A, Cr 1.9 ( close to baseline) WBC 7.1 w/ 85/5/9, U/A negative, Troponin 30, glucose 192. Chest CT showed:  Airway thickening with patchy air trapping and small ground-glass opacities, possible sequela of patient's recent COVID. No consolidating pneumonia  They were initially treated with tamiflu, IV fluids, tylenol, and home medications. He required up to 2L [place].    Patient was admitted to medicine service for further workup and management of influenza A as outlined in detail below.  03/15/22 -stable respiratory status. Worsened AKI  Assessment & Plan  Principal Problem:   Influenza A Active Problems:   Chronic diastolic CHF (congestive heart failure) (HCC)   DM2 (diabetes mellitus, type 2) (HCC)   Atrial fibrillation, chronic (HCC)   Hyperlipidemia   GERD (gastroesophageal reflux disease)   AKI (acute kidney injury) ([place])   Acute respiratory failure with hypoxia (HCC)   Nausea and vomiting in adult  Influenza A Patient with presentation of myalgias, persistent cough, fever, N/V and a respiratory panel was positive for influenza A. At presentation he was hypoxemic and required oxygen supplementation  up to 2L[place]. Now resting comfortably on room air. Airways appear clear on exam.  - will need evaluation of ambulation with pulse ox prior to dc.  - continue Tamiflu 75 mg BID - tylenol, zofran, mucinex, honey PRN   Afib  CAD s/p CABG and PCI, HFpEF and HTN-  Patient with stable persistent atrial fibrillation. He has been well managed medically and has controlled rate at admission and no chest pain. - Continue all home cardiac medications including Eliquis at reduced dose for renal function   DM2- Lat A1C from July '23 was 7.9 % He reports occasional low CBG readings on his current regimen. He reports that his PCP plans to refer him to endocrinology for consultation. -Continue home regimen - Sliding scale coverage   AKI on CKDII- likely in relation to dehydration in acute illness setting. Cr 1.9>2.29>2.5 from baseline of about 1.5. actually has appointment outpatient tomorrow to establish OP nephrology care for CKD - encourage PO hydration. IV fluids have been discontinued in setting of HF but will do small supplementation today - BMP am.  - holding statin and entresto, decreased eliquis   GERD (gastroesophageal reflux disease) Patient with N/V related to influenza. Denies active reflux or GI pain. - Continue PPI tx.   Hyperlipidemia Stable - stopped statin for decreased kidney function  Body mass index is 28.1 kg/m.  VTE ppx: apixaban (ELIQUIS) tablet 2.5 mg Start: 03/14/22 2200 apixaban (ELIQUIS) tablet 2.5 mg   Diet:     Diet   Diet  heart healthy/carb modified Room service appropriate? Yes; Fluid consistency: Thin   Consultants: None   Subjective 03/15/22    Pt reports feeling much improved. Endorses ongoing cough. No dyspnea while resting. Denies LE swelling. Has appointment OP with nephrology tomorrow to establish care for CKD.    Objective   Vitals:   03/14/22 1647 03/14/22 2016 03/14/22 2140 03/15/22 0514  BP: 106/63 (!) 125/54  135/69  Pulse: 64 (!) 59  64   Resp: 18 18    Temp: 100.2 F (37.9 C) 98.2 F (36.8 C) 98.5 F (36.9 C) 98.4 F (36.9 C)  TempSrc:  Oral  Oral  SpO2: 95% 100%  98%  Weight:      Height:        Intake/Output Summary (Last 24 hours) at 03/15/2022 0657 Last data filed at 03/15/2022 0131 Gross per 24 hour  Intake --  Output 300 ml  Net -300 ml    Filed Weights   03/13/22 0029  Weight: 96.6 kg     Physical Exam:  General: awake, alert, NAD HEENT: atraumatic, clear conjunctiva, anicteric sclera, MMM, hearing grossly normal Respiratory: normal respiratory effort. CTAB Cardiovascular: quick capillary refill, normal S1/S2, RRR, no JVD, murmurs Nervous: A&O x3. no gross focal neurologic deficits, normal speech Extremities: moves all equally, no edema, normal tone Skin: dry, intact, normal temperature, normal color. No rashes, lesions or ulcers on exposed skin Psychiatry: normal mood, congruent affect  Labs   I have personally reviewed the following labs and imaging studies CBC    Component Value Date/Time   WBC 5.1 03/15/2022 0538   RBC 3.44 (L) 03/15/2022 0538   HGB 10.9 (L) 03/15/2022 0538   HGB 12.1 (L) 12/03/2020 1414   HCT 32.7 (L) 03/15/2022 0538   HCT 36.3 (L) 12/03/2020 1414   PLT 80 (L) 03/15/2022 0538   PLT 95 (LL) 12/03/2020 1414   MCV 95.1 03/15/2022 0538   MCV 95 12/03/2020 1414   MCV 92 02/22/2013 1654   MCH 31.7 03/15/2022 0538   MCHC 33.3 03/15/2022 0538   RDW 13.8 03/15/2022 0538   RDW 12.5 12/03/2020 1414   RDW 13.3 02/22/2013 1654   LYMPHSABS 0.3 (L) 03/13/2022 0034   MONOABS 0.6 03/13/2022 0034   EOSABS 0.1 03/13/2022 0034   BASOSABS 0.0 03/13/2022 0034      Latest Ref Rng & Units 03/15/2022    5:38 AM 03/14/2022    8:29 AM 03/13/2022   12:34 AM  BMP  Glucose 70 - 99 mg/dL 124  106  192   BUN 8 - 23 mg/dL 41  33  22   Creatinine 0.61 - 1.24 mg/dL 2.50  2.29  1.90   Sodium 135 - 145 mmol/L 134  134  137   Potassium 3.5 - 5.1 mmol/L 3.8  4.0  4.0   Chloride 98 - 111 mmol/L  104  105  106   CO2 22 - 32 mmol/L '22  22  22   '$ Calcium 8.9 - 10.3 mg/dL 7.9  8.0  8.5    Disposition Plan & Communication  Patient status: Inpatient  Admitted From: Home Planned disposition location: Home Anticipated discharge date: 1/10 pending resolution of AKI and maintain O2 saturations while ambulating  Family Communication: none at bedside    Author: Richarda Osmond, DO [facility] 03/15/2022, 6:57 AM   Available by Epic secure chat 7AM-7PM. If 7PM-7AM, please contact night-coverage.  TRH contact information found on [email or web address].

## 2022-03-16 ENCOUNTER — Inpatient Hospital Stay: Payer: Medicare Other

## 2022-03-16 ENCOUNTER — Telehealth: Payer: Self-pay

## 2022-03-16 DIAGNOSIS — R112 Nausea with vomiting, unspecified: Secondary | ICD-10-CM | POA: Diagnosis not present

## 2022-03-16 DIAGNOSIS — B3742 Candidal balanitis: Secondary | ICD-10-CM | POA: Diagnosis not present

## 2022-03-16 DIAGNOSIS — J101 Influenza due to other identified influenza virus with other respiratory manifestations: Secondary | ICD-10-CM | POA: Diagnosis not present

## 2022-03-16 DIAGNOSIS — J9601 Acute respiratory failure with hypoxia: Secondary | ICD-10-CM | POA: Diagnosis not present

## 2022-03-16 DIAGNOSIS — I5020 Unspecified systolic (congestive) heart failure: Secondary | ICD-10-CM

## 2022-03-16 LAB — GLUCOSE, CAPILLARY
Glucose-Capillary: 419 mg/dL — ABNORMAL HIGH (ref 70–99)
Glucose-Capillary: 118 mg/dL — ABNORMAL HIGH (ref 70–99)
Glucose-Capillary: 152 mg/dL — ABNORMAL HIGH (ref 70–99)
Glucose-Capillary: 148 mg/dL — ABNORMAL HIGH (ref 70–99)
Glucose-Capillary: 227 mg/dL — ABNORMAL HIGH (ref 70–99)
Glucose-Capillary: 164 mg/dL — ABNORMAL HIGH (ref 70–99)
Glucose-Capillary: 139 mg/dL — ABNORMAL HIGH (ref 70–99)

## 2022-03-16 LAB — COMPREHENSIVE METABOLIC PANEL
ALT: 20 U/L (ref 0–44)
AST: 38 U/L (ref 15–41)
Albumin: 2.8 g/dL — ABNORMAL LOW (ref 3.5–5.0)
Alkaline Phosphatase: 86 U/L (ref 38–126)
Anion gap: 6 (ref 5–15)
BUN: 42 mg/dL — ABNORMAL HIGH (ref 8–23)
CO2: 22 mmol/L (ref 22–32)
Calcium: 7.6 mg/dL — ABNORMAL LOW (ref 8.9–10.3)
Chloride: 104 mmol/L (ref 98–111)
Creatinine, Ser: 2.63 mg/dL — ABNORMAL HIGH (ref 0.61–1.24)
GFR, Estimated: 23 mL/min — ABNORMAL LOW (ref >60)
Glucose, Bld: 150 mg/dL — ABNORMAL HIGH (ref 70–99)
Potassium: 3.2 mmol/L — ABNORMAL LOW (ref 3.5–5.1)
Sodium: 132 mmol/L — ABNORMAL LOW (ref 135–145)
Total Bilirubin: 0.7 mg/dL (ref 0.3–1.2)
Total Protein: 5.6 g/dL — ABNORMAL LOW (ref 6.5–8.1)

## 2022-03-16 LAB — BLOOD GAS, VENOUS
Acid-base deficit: 2 mmol/L (ref 0.0–2.0)
Bicarbonate: 23.1 mmol/L (ref 20.0–28.0)
O2 Saturation: 92.3 %
Patient temperature: 37
pCO2, Ven: 40 mmHg — ABNORMAL LOW (ref 44–60)
pH, Ven: 7.37 (ref 7.25–7.43)
pO2, Ven: 64 mmHg — ABNORMAL HIGH (ref 32–45)

## 2022-03-16 LAB — CBC
HCT: 29.5 % — ABNORMAL LOW (ref 39.0–52.0)
Hemoglobin: 10.1 g/dL — ABNORMAL LOW (ref 13.0–17.0)
MCH: 32 pg (ref 26.0–34.0)
MCHC: 34.2 g/dL (ref 30.0–36.0)
MCV: 93.4 fL (ref 80.0–100.0)
Platelets: 68 10*3/uL — ABNORMAL LOW (ref 150–400)
RBC: 3.16 MIL/uL — ABNORMAL LOW (ref 4.22–5.81)
RDW: 13.7 % (ref 11.5–15.5)
WBC: 4.2 10*3/uL (ref 4.0–10.5)
nRBC: 0 % (ref 0.0–0.2)

## 2022-03-16 LAB — HEMOGLOBIN A1C
Hgb A1c MFr Bld: 6.7 % — ABNORMAL HIGH (ref 4.8–5.6)
Mean Plasma Glucose: 145.59 mg/dL

## 2022-03-16 MED ORDER — DEXTROSE 10 % IV SOLN: INTRAVENOUS | Status: DC

## 2022-03-16 MED ORDER — INSULIN ASPART 100 UNIT/ML IJ SOLN
0.0000 [IU] | Freq: Three times a day (TID) | INTRAMUSCULAR | Status: DC
  Administered 2022-03-16: 1 [IU] via SUBCUTANEOUS
  Administered 2022-03-16: 2 [IU] via SUBCUTANEOUS
  Filled 2022-03-16 (×3): qty 1

## 2022-03-16 MED ORDER — GLUCAGON HCL RDNA (DIAGNOSTIC) 1 MG IJ SOLR: 1.0000 mg | Freq: Once | INTRAMUSCULAR | Status: DC | PRN

## 2022-03-16 MED ORDER — LACTATED RINGERS IV SOLN: INTRAVENOUS | Status: DC

## 2022-03-16 MED ORDER — DEXTROSE 50 % IV SOLN
INTRAVENOUS | Status: AC
  Filled 2022-03-16: qty 50

## 2022-03-16 MED ORDER — CLOTRIMAZOLE 1 % EX CREA
TOPICAL_CREAM | Freq: Two times a day (BID) | CUTANEOUS | Status: DC
  Filled 2022-03-16: qty 15

## 2022-03-16 MED ORDER — INSULIN GLARGINE-YFGN 100 UNIT/ML ~~LOC~~ SOLN
20.0000 [IU] | Freq: Every day | SUBCUTANEOUS | Status: DC
  Administered 2022-03-16: 20 [IU] via SUBCUTANEOUS
  Filled 2022-03-16 (×2): qty 0.2

## 2022-03-16 NOTE — Evaluation (Signed)
Physical Therapy Evaluation Patient Details Name: [name] [name] MRN: [id number] DOB: 1933/08/03 Today's Date: 03/16/2022  History of Present Illness  Pt is an 89 year old male admitted with influenza A with AKI; PMH significant for permanent A-fib, coronary artery disease status post CABG, heart failure with preserved ejection fraction, type 2 diabetes, CKD stage IIIb, hypertension. In November '23 he had a right caudate CVA with minimal sequellae, recently had COVID 16  Clinical Impression  Pt is a pleasant 89 year old male who was admitted for flu A. Recent CVA and covid diagnosis with multiple hospitalizations. Pt performs transfers and ambulation with supervision and RW. Pt demonstrates deficits with strength/mobility. Would benefit from skilled PT to address above deficits and promote optimal return to PLOF. Recommend transition to [place] upon discharge from acute hospitalization.       Recommendations for follow up therapy are one component of a multi-disciplinary discharge planning process, led by the attending physician.  Recommendations may be updated based on patient status, additional functional criteria and insurance authorization.  Follow Up Recommendations Home health PT      Assistance Recommended at Discharge PRN  Patient can return home with the following  A little help with walking and/or transfers    Equipment Recommendations None recommended by PT  Recommendations for Other Services       Functional Status Assessment Patient has had a recent decline in their functional status and demonstrates the ability to make significant improvements in function in a reasonable and predictable amount of time.     Precautions / Restrictions Precautions Precautions: Fall Restrictions Weight Bearing Restrictions: No      Mobility  Bed Mobility               General bed mobility comments: NT, pt seated in recliner    Transfers Overall transfer level: Needs  assistance Equipment used: Rolling walker (2 wheels) Transfers: Sit to/from Stand Sit to Stand: Supervision           General transfer comment: safe technique with RW    Ambulation/Gait Ambulation/Gait assistance: Supervision Gait Distance (Feet): 300 Feet Assistive device: Rolling walker (2 wheels) Gait Pattern/deviations: Step-through pattern       General Gait Details: ambulated around RN station with ease of mobilty. Able to carry conversation with gait training.  Stairs            Wheelchair Mobility    Modified Rankin (Stroke Patients Only)       Balance Overall balance assessment: Needs assistance Sitting-balance support: Feet supported Sitting balance-Leahy Scale: Good     Standing balance support: Bilateral upper extremity supported, Reliant on assistive device for balance, During functional activity Standing balance-Leahy Scale: Fair                               Pertinent Vitals/Pain Pain Assessment Pain Assessment: No/denies pain    Home Living Family/patient expects to be discharged to:: Private residence Living Arrangements: Spouse/significant other Available Help at Discharge: Family;Available 24 hours/day Type of Home: House Home Access: Ramped entrance       Home Layout: One level Home Equipment: Conservation officer, nature (2 wheels);Rollator (4 wheels);Cane - single point;Hand held shower head;Grab bars - tub/shower;Shower seat - built in      Prior Function Prior Level of Function : Independent/Modified Independent;History of Falls (last six months)             Mobility Comments: RW at  all times household/community distances ADLs Comments: generally MOD I for ADLs, wife/family assist with IADLs as needed     Hand Dominance        Extremity/Trunk Assessment   Upper Extremity Assessment Upper Extremity Assessment: Defer to OT evaluation RUE Deficits / Details: healing R hand injury, able to utilize throughout  functional tasks LUE Deficits / Details: AROM: WFL; MMT: shoulder flexion: 4/5, extension 4/5, elbow flexion: 4-/5, elbow extension: 4-/5, wrist flexion/extension 4-/5; weak grip LUE Sensation: decreased light touch LUE Coordination: decreased fine motor;decreased gross motor    Lower Extremity Assessment Lower Extremity Assessment: Generalized weakness LLE Sensation:  (L knee weakness and decreased sensation)       Communication   Communication: No difficulties  Cognition Arousal/Alertness: Awake/alert Behavior During Therapy: WFL for tasks assessed/performed Overall Cognitive Status: Within Functional Limits for tasks assessed                                          General Comments General comments (skin integrity, edema, etc.): spo2 >90% on RA throughout    Exercises     Assessment/Plan    PT Assessment Patient needs continued PT services  PT Problem List Decreased strength;Decreased balance;Decreased mobility       PT Treatment Interventions DME instruction;Gait training;Therapeutic exercise;Balance training    PT Goals (Current goals can be found in the Care Plan section)  Acute Rehab PT Goals Patient Stated Goal: to go home PT Goal Formulation: With patient Time For Goal Achievement: 03/30/22 Potential to Achieve Goals: Good    Frequency Min 2X/week     Co-evaluation               AM-PAC PT "6 Clicks" Mobility  Outcome Measure Help needed turning from your back to your side while in a flat bed without using bedrails?: None Help needed moving from lying on your back to sitting on the side of a flat bed without using bedrails?: None Help needed moving to and from a bed to a chair (including a wheelchair)?: None Help needed standing up from a chair using your arms (e.g., wheelchair or bedside chair)?: None Help needed to walk in hospital room?: A Little Help needed climbing 3-5 steps with a railing? : A Little 6 Click Score: 22     End of Session Equipment Utilized During Treatment: Gait belt Activity Tolerance: Patient tolerated treatment well Patient left: in chair Nurse Communication: Mobility status PT Visit Diagnosis: Muscle weakness (generalized) (M62.81);Difficulty in walking, not elsewhere classified (R26.2)    Time: 4627-0350 PT Time Calculation (min) (ACUTE ONLY): 12 min   Charges:   PT Evaluation $PT Eval Low Complexity: 1 Low          Greggory Stallion, PT, DPT, GCS [phone]   Rikki Trosper 03/16/2022, 4:48 PM

## 2022-03-16 NOTE — Significant Event (Signed)
Rapid Response Event Note   Reason for Call : CBG 23  Initial Focused Assessment:  Patient obtunded but moaning, able to move spontaneously Unit RNs having difficulty placing PIV to administer D50  Interventions:  PIV started by this RN, D50 administered by bedside RN, vital signs and blood sugar rechecked, Hospitalist NP contacted by bedside RN  Plan of Care:  Will continue to monitor, bedside RN to call for further assistance   Timothy Lasso, RN

## 2022-03-16 NOTE — Progress Notes (Addendum)
PROGRESS NOTE  [name] [name]    DOB: Jan 20, 1934, 89 y.o.  XIH:[id number]    Code Status: Full Code   DOA: 03/13/2022   LOS: 3   Brief hospital course  [name] [name] is a 89 y.o. male with a PMH significant for permanent A-fib, coronary artery disease status post CABG, heart failure with preserved ejection fraction, type 2 diabetes, CKD stage IIIb, hypertension. In November '23 he had a right caudate CVA with minimal sequellae and was able to return home. He subsequently had a bout of Covid 19 for which he was treated.  They presented from home to the ED on 03/13/2022 with cough x several days. He also had myalgias, fever, N/V.  In the ED, it was found that they had T 99.9, 157/73 HR 78 RR 16 O2 sat 96% on 2L [place].  Significant findings included Covid NEGATIVE, POSITIVE for inlfuenza A, Cr 1.9 ( close to baseline) WBC 7.1 w/ 85/5/9, U/A negative, Troponin 30, glucose 192. Chest CT showed:  Airway thickening with patchy air trapping and small ground-glass opacities, possible sequela of patient's recent COVID. No consolidating pneumonia  They were initially treated with tamiflu, IV fluids, tylenol, and home medications. He required up to 2L [place].    Patient was admitted to medicine service for further workup and management of influenza A as outlined in detail below.  03/16/22 -stable respiratory status. Worsened AKI. Overnight event o  Assessment & Plan  Principal Problem:   Influenza A Active Problems:   Chronic diastolic CHF (congestive heart failure) (HCC)   DM2 (diabetes mellitus, type 2) (HCC)   Atrial fibrillation, chronic (HCC)   Hyperlipidemia   GERD (gastroesophageal reflux disease)   AKI (acute kidney injury) ([place])   Acute respiratory failure with hypoxia (HCC)   Nausea and vomiting in adult  AKI on CKDII- likely in relation to dehydration in acute illness setting. Cr 1.9>2.29>2.5>2.63 from baseline of about 1.5. actually has appointment to establish OP nephrology care for  CKD - encourage PO hydration. IV fluids have been discontinued in setting of HF and LE swelling - BMP am.  - holding statin and entresto, decreased eliquis  DM2- Hypoglycemia overnight- episode of bg 23 overnight with correction with dextrose. Due to a combination of poor PO intake in hospital due to acute illness and having insulin administered at night. Already had decreased insulin dose from home regimen.  - further decrease insulin dose and move to morning administration for increased safety profile Lat A1C from July '23 was 7.9 % - Sliding scale coverage - hgb A1c added on  Candidiasis of penis- reported to have began prior to admission - stop farxiga - topical clotrimazole   Influenza A Patient with presentation of myalgias, persistent cough, fever, N/V and a respiratory panel was positive for influenza A. At presentation he was hypoxemic and required oxygen supplementation up to 2Lnc. Now resting comfortably on room air. Airways appear clear on exam.  - will need evaluation of ambulation with pulse ox prior to dc.  - continue Tamiflu 75 mg BID - tylenol, zofran, mucinex, honey PRN - PT/OT   Afib  CAD s/p CABG and PCI, HFpEF and HTN-  Patient with stable persistent atrial fibrillation. He has been well managed medically and has controlled rate at admission and no chest pain. States his dry weight is 210-213. - Continue all home cardiac medications including Eliquis at reduced dose for renal function - TED hose, encourage ambulation - strict I/O - daily weights  GERD (gastroesophageal reflux disease) Patient with N/V related to influenza. Denies active reflux or GI pain. - Continue PPI tx.   Hyperlipidemia Stable - stopped statin for decreased kidney function  Body mass index is 28.1 kg/m.  VTE ppx: apixaban (ELIQUIS) tablet 2.5 mg Start: 03/14/22 2200 apixaban (ELIQUIS) tablet 2.5 mg   Diet:     Diet   Diet heart healthy/carb modified Room service appropriate?  Yes; Fluid consistency: Thin   Consultants: None   Subjective 03/16/22    Pt reports feeling well today. Denies SOB, CP.  He does not remember the hypoglycemic episode overnight.  He states that he has some redness and irritation at his penis head and is uncircumcised.    Objective   Vitals:   03/15/22 2026 03/15/22 2033 03/15/22 2345 03/16/22 0416  BP: (!) 133/36 (!) 120/51 130/60 (!) 117/50  Pulse: (!) 56 (!) 57 (!) 50 (!) 44  Resp: '18  19 20  '$ Temp: 98.6 F (37 C)  98.7 F (37.1 C) (!) 97.5 F (36.4 C)  TempSrc:   Oral Oral  SpO2: 96%  94% 99%  Weight:      Height:        Intake/Output Summary (Last 24 hours) at 03/16/2022 0704 Last data filed at 03/16/2022 3016 Gross per 24 hour  Intake 39.15 ml  Output 325 ml  Net -285.85 ml    Filed Weights   03/13/22 0029  Weight: 96.6 kg     Physical Exam:  General: awake, alert, NAD HEENT: atraumatic, clear conjunctiva, anicteric sclera, MMM, hearing grossly normal Respiratory: normal respiratory effort. CTAB Cardiovascular: quick capillary refill, normal S1/S2, RRR, no JVD, murmurs Nervous: A&O x3. no gross focal neurologic deficits, normal speech Extremities: moves all equally, no edema, normal tone Skin: dry, intact, normal temperature, normal color. No rashes, lesions or ulcers on exposed skin Psychiatry: normal mood, congruent affect  Labs   I have personally reviewed the following labs and imaging studies CBC    Component Value Date/Time   WBC 4.2 03/16/2022 0228   RBC 3.16 (L) 03/16/2022 0228   HGB 10.1 (L) 03/16/2022 0228   HGB 12.1 (L) 12/03/2020 1414   HCT 29.5 (L) 03/16/2022 0228   HCT 36.3 (L) 12/03/2020 1414   PLT 68 (L) 03/16/2022 0228   PLT 95 (LL) 12/03/2020 1414   MCV 93.4 03/16/2022 0228   MCV 95 12/03/2020 1414   MCV 92 02/22/2013 1654   MCH 32.0 03/16/2022 0228   MCHC 34.2 03/16/2022 0228   RDW 13.7 03/16/2022 0228   RDW 12.5 12/03/2020 1414   RDW 13.3 02/22/2013 1654   LYMPHSABS 0.3  (L) 03/13/2022 0034   MONOABS 0.6 03/13/2022 0034   EOSABS 0.1 03/13/2022 0034   BASOSABS 0.0 03/13/2022 0034      Latest Ref Rng & Units 03/16/2022    2:28 AM 03/15/2022    5:38 AM 03/14/2022    8:29 AM  BMP  Glucose 70 - 99 mg/dL 150  124  106   BUN 8 - 23 mg/dL 42  41  33   Creatinine 0.61 - 1.24 mg/dL 2.63  2.50  2.29   Sodium 135 - 145 mmol/L 132  134  134   Potassium 3.5 - 5.1 mmol/L 3.2  3.8  4.0   Chloride 98 - 111 mmol/L 104  104  105   CO2 22 - 32 mmol/L '22  22  22   '$ Calcium 8.9 - 10.3 mg/dL 7.6  7.9  8.0  Disposition Plan & Communication  Patient status: Inpatient  Admitted From: Home Planned disposition location: Home Anticipated discharge date: 1/11 pending resolution of AKI and maintain O2 saturations while ambulating  Family Communication: wifeat bedside    Author: Richarda Osmond, DO [facility] 03/16/2022, 7:04 AM   Available by Epic secure chat 7AM-7PM. If 7PM-7AM, please contact night-coverage.  [facility] contact information found on [email or web address].

## 2022-03-16 NOTE — Progress Notes (Signed)
       CROSS COVER NOTE  NAME: [name] [name] MRN: [id number] DOB : October 14, 1933 ATTENDING PHYSICIAN: Richarda Osmond, MD    Date of Service   03/16/2022   HPI/Events of Note   Rapid response called for Hypoglycemia --> CBG 23 and altered mental status.   On arrival to bedside Mr [name] [name]' eyes are open, he is looking around but does not make eye contact. He is unable to respond verbally at this time and grunts. 1 amp D50 given with brisk improvement in mentation. Patient able to make eye contact and track but still not speaking. Another amp of D50 given and Mr [name] [name] is back to baseline but drowsy. He is able to make eye contact, track, follow commands. He recognizes his wife at bedside and he responds apprpriately to myself and wife verbally. No focal deficits noted on exam. Wife is very concerned that this could be a stroke after recent stroke in November. Will order CT Head at her request, though I believe symptoms tonight are secondary to hypoglycemia and I expect imaging to be negative.   Interventions   Assessment/Plan:  2 Amps D50 CT Head- negative CBC, BMP, VBG        To reach the provider On-Call:   7AM- 7PM see care teams to locate the attending and reach out to them via [email or web address]. 7PM-7AM contact night-coverage If you still have difficulty reaching the appropriate provider, please page the [facility] (Director on Call) for [facility] on amion for assistance  This document was prepared using Set designer software and may include unintentional dictation errors.  Neomia Glass DNP, MBA, FNP-BC Nurse Practitioner [facility] Pager [phone]

## 2022-03-16 NOTE — Evaluation (Signed)
Occupational Therapy Evaluation Patient Details Name: [name] [name] MRN: [id number] DOB: 07-Aug-1933 Today's Date: 03/16/2022   History of Present Illness Pt is an 89 year old male admitted with influenza A with AKI; PMH significant for permanent A-fib, coronary artery disease status post CABG, heart failure with preserved ejection fraction, type 2 diabetes, CKD stage IIIb, hypertension. In November '23 he had a right caudate CVA with minimal sequellae, recently had COVID 19   Clinical Impression   Chart reviewed, pt greeted in bathroom with wife present agreeable to OT evaluation. Pt is alert and oriented x4, appropriate awareness of deficits. PT reports since recent hospitalization amb household distances with RW, wife assists with IADL as needed, performs ADLs with MOD I generally (but has assist available if needed). Pt presents with deficits in strength, endurance, activity tolerance LUE function affecting safe and optimal ADL completion. Supervision required for toielting tasks, amb in room with RW with supervision, UB dressing with SET UP, anticipate MIN A for LB dressing. Pt and wife report difficulties in participation in [facility] therapy after previous discharge due to [place] diagnosis after hospitalization. OT will continue to follow acutely.      Recommendations for follow up therapy are one component of a multi-disciplinary discharge planning process, led by the attending physician.  Recommendations may be updated based on patient status, additional functional criteria and insurance authorization.   Follow Up Recommendations  Home health OT     Assistance Recommended at Discharge Intermittent Supervision/Assistance  Patient can return home with the following A little help with bathing/dressing/bathroom;Assistance with cooking/housework;Help with stairs or ramp for entrance;Direct supervision/assist for medications management    Functional Status Assessment  Patient has had a recent  decline in their functional status and demonstrates the ability to make significant improvements in function in a reasonable and predictable amount of time.  Equipment Recommendations  None recommended by OT    Recommendations for Other Services       Precautions / Restrictions Precautions Precautions: Fall      Mobility Bed Mobility               General bed mobility comments: NT standing at toilet pre session, seated at edge of bed post session    Transfers Overall transfer level: Needs assistance Equipment used: Rolling walker (2 wheels) Transfers: Sit to/from Stand Sit to Stand: Supervision                  Balance Overall balance assessment: Needs assistance Sitting-balance support: Feet supported Sitting balance-Leahy Scale: Good     Standing balance support: Bilateral upper extremity supported, Reliant on assistive device for balance, During functional activity Standing balance-Leahy Scale: Fair                             ADL either performed or assessed with clinical judgement   ADL Overall ADL's : Needs assistance/impaired Eating/Feeding: Set up;Sitting   Grooming: Wash/dry hands;Standing;Supervision/safety Grooming Details (indicate cue type and reason): with RW at sink level         Upper Body Dressing : Supervision/safety;Sitting   Lower Body Dressing: Minimal assistance   Toilet Transfer: Supervision/safety;Rolling walker (2 wheels)   Toileting- Clothing Manipulation and Hygiene: Supervision/safety Toileting - Clothing Manipulation Details (indicate cue type and reason): standing over toilet when this therapist entered room     Functional mobility during ADLs: Supervision/safety;Rolling walker (2 wheels) (approx 25')       Vision Baseline  Vision/History: 6 Macular Degeneration Patient Visual Report: No change from baseline Additional Comments: R eye macular degeneration     Perception     Praxis      Pertinent  Vitals/Pain Pain Assessment Pain Assessment: No/denies pain     Hand Dominance     Extremity/Trunk Assessment Upper Extremity Assessment Upper Extremity Assessment: RUE deficits/detail;LUE deficits/detail RUE Deficits / Details: healing R hand injury, able to utilize throughout functional tasks LUE Deficits / Details: AROM: WFL; MMT: shoulder flexion: 4/5, extension 4/5, elbow flexion: 4-/5, elbow extension: 4-/5, wrist flexion/extension 4-/5; weak grip LUE Sensation: decreased light touch LUE Coordination: decreased fine motor;decreased gross motor   Lower Extremity Assessment Lower Extremity Assessment: Defer to PT evaluation LLE Sensation: decreased light touch       Communication Communication Communication: No difficulties   Cognition Arousal/Alertness: Awake/alert Behavior During Therapy: WFL for tasks assessed/performed Overall Cognitive Status: Within Functional Limits for tasks assessed                                       General Comments  spo2 >90% on RA throughout    Exercises Other Exercises Other Exercises: edu pt and wife re: role of OT, role of rehab, discharge recommendations, home safety, falls prevention, LUE strengthening   Shoulder Instructions      Home Living Family/patient expects to be discharged to:: Private residence Living Arrangements: Spouse/significant other Available Help at Discharge: Family;Available 24 hours/day Type of Home: House Home Access: Ramped entrance     Home Layout: One level     Bathroom Shower/Tub: Occupational psychologist: Standard     Home Equipment: Conservation officer, nature (2 wheels);Rollator (4 wheels);Cane - single point;Hand held shower head;Grab bars - tub/shower;Shower seat - built in          Prior Functioning/Environment Prior Level of Function : Independent/Modified Independent;History of Falls (last six months)             Mobility Comments: RW at all times household/community  distances ADLs Comments: generally MOD I for ADLs, wife/family assist with IADLs as needed        OT Problem List: Decreased strength;Decreased activity tolerance;Impaired balance (sitting and/or standing);Decreased knowledge of precautions;Decreased knowledge of use of DME or AE      OT Treatment/Interventions: Self-care/ADL training;Patient/family education;Therapeutic exercise;Balance training;Neuromuscular education;Splinting;Modalities;DME and/or AE instruction;Therapeutic activities    OT Goals(Current goals can be found in the care plan section) Acute Rehab OT Goals Patient Stated Goal: return home OT Goal Formulation: With patient Time For Goal Achievement: 03/30/22 Potential to Achieve Goals: Good ADL Goals Pt Will Perform Grooming: with modified independence;sitting;standing Pt Will Perform Lower Body Dressing: with modified independence;sit to/from stand Pt Will Transfer to Toilet: with modified independence;ambulating Pt Will Perform Toileting - Clothing Manipulation and hygiene: with modified independence;sit to/from stand  OT Frequency: Min 2X/week    Co-evaluation              AM-PAC OT "6 Clicks" Daily Activity     Outcome Measure Help from another person eating meals?: None Help from another person taking care of personal grooming?: None Help from another person toileting, which includes using toliet, bedpan, or urinal?: None Help from another person bathing (including washing, rinsing, drying)?: A Little Help from another person to put on and taking off regular upper body clothing?: None Help from another person to put on and taking off regular  lower body clothing?: A Little 6 Click Score: 22   End of Session Equipment Utilized During Treatment: Rolling walker (2 wheels) Nurse Communication: Mobility status  Activity Tolerance: Patient tolerated treatment well Patient left: Other (comment) (at edge of bed)  OT Visit Diagnosis: Unsteadiness on feet  (R26.81);Muscle weakness (generalized) (M62.81)                Time: 3244-0102 OT Time Calculation (min): 17 min Charges:  OT General Charges $OT Visit: 1 Visit OT Evaluation $OT Eval Low Complexity: 1 Low  Shanon Payor, OTD OTR/L  03/16/22, 1:22 PM

## 2022-03-16 NOTE — Plan of Care (Signed)
At approx 0200, was called in to room by pt's wife, pt appeared to be having seizure like activity, he was incoherent and pupils were pinpoint. He was sweating profusely.  Checked blood sugar and it was 23.  Called a rapid response.  Gave 2 amps of D50 and pt started being more responsive.  On call NP ordered head CT and labs.  Will recheck blood sugar at 0400 and 0600.

## 2022-03-16 NOTE — Progress Notes (Signed)
Mobility Specialist - Progress Note   03/16/22 1024  Mobility  Activity Ambulated with assistance in room  Level of Assistance Standby assist, set-up cues, supervision of patient - no hands on  Assistive Device Front wheel walker  Distance Ambulated (ft) 60 ft  Activity Response Tolerated well  Mobility Referral Yes  $Mobility charge 1 Mobility   Pt EOB on RA upon arrival. Pt STS and ambulates within room Supervision. Pt able to take steps backwards and laterally without LOB. Pt returns to bed with needs in reach and wife present.   [name] [name]  Mobility Specialist  03/16/22 10:26 AM

## 2022-03-16 NOTE — Telephone Encounter (Signed)
I called the patient and informed him that his medication Toujeo has arrived and he can pick it up at the front office and he understood.  Dustine Stickler,cma

## 2022-03-17 ENCOUNTER — Telehealth: Payer: Self-pay | Admitting: Family Medicine

## 2022-03-17 DIAGNOSIS — E118 Type 2 diabetes mellitus with unspecified complications: Secondary | ICD-10-CM

## 2022-03-17 LAB — CBC
HCT: 29.6 % — ABNORMAL LOW (ref 39.0–52.0)
Hemoglobin: 10 g/dL — ABNORMAL LOW (ref 13.0–17.0)
MCH: 31.6 pg (ref 26.0–34.0)
MCHC: 33.8 g/dL (ref 30.0–36.0)
MCV: 93.7 fL (ref 80.0–100.0)
Platelets: 67 10*3/uL — ABNORMAL LOW (ref 150–400)
RBC: 3.16 MIL/uL — ABNORMAL LOW (ref 4.22–5.81)
RDW: 13.6 % (ref 11.5–15.5)
WBC: 3.6 10*3/uL — ABNORMAL LOW (ref 4.0–10.5)
nRBC: 0 % (ref 0.0–0.2)

## 2022-03-17 LAB — GLUCOSE, CAPILLARY
Glucose-Capillary: 23 mg/dL — CL (ref 70–99)
Glucose-Capillary: 55 mg/dL — ABNORMAL LOW (ref 70–99)
Glucose-Capillary: 70 mg/dL (ref 70–99)
Glucose-Capillary: 98 mg/dL (ref 70–99)

## 2022-03-17 LAB — BASIC METABOLIC PANEL
Anion gap: 6 (ref 5–15)
BUN: 39 mg/dL — ABNORMAL HIGH (ref 8–23)
CO2: 22 mmol/L (ref 22–32)
Calcium: 7.7 mg/dL — ABNORMAL LOW (ref 8.9–10.3)
Chloride: 108 mmol/L (ref 98–111)
Creatinine, Ser: 2.05 mg/dL — ABNORMAL HIGH (ref 0.61–1.24)
GFR, Estimated: 31 mL/min — ABNORMAL LOW (ref >60)
Glucose, Bld: 61 mg/dL — ABNORMAL LOW (ref 70–99)
Potassium: 3.8 mmol/L (ref 3.5–5.1)
Sodium: 136 mmol/L (ref 135–145)

## 2022-03-17 MED ORDER — CLOTRIMAZOLE 1 % EX CREA: TOPICAL_CREAM | Freq: Two times a day (BID) | CUTANEOUS | 0 refills | Status: AC

## 2022-03-17 MED ORDER — ONDANSETRON HCL 4 MG/2ML IJ SOLN
4.0000 mg | Freq: Once | INTRAMUSCULAR | Status: AC
  Administered 2022-03-17: 4 mg via INTRAVENOUS
  Filled 2022-03-17: qty 2

## 2022-03-17 MED ORDER — BENZONATATE 100 MG PO CAPS: 100.0000 mg | ORAL_CAPSULE | Freq: Four times a day (QID) | ORAL | 0 refills | Status: DC | PRN

## 2022-03-17 MED ORDER — INSULIN GLARGINE-YFGN 100 UNIT/ML ~~LOC~~ SOLN
10.0000 [IU] | Freq: Every day | SUBCUTANEOUS | Status: DC
  Filled 2022-03-17: qty 0.1

## 2022-03-17 MED ORDER — TOUJEO SOLOSTAR 300 UNIT/ML ~~LOC~~ SOPN: 10.0000 [IU] | PEN_INJECTOR | Freq: Every day | SUBCUTANEOUS | 6 refills | Status: DC

## 2022-03-17 MED ORDER — APIXABAN 2.5 MG PO TABS: 2.5000 mg | ORAL_TABLET | Freq: Two times a day (BID) | ORAL | 0 refills | Status: DC

## 2022-03-17 NOTE — Discharge Summary (Signed)
Physician Discharge Summary  Patient: [name] [name] DTO:[id number] DOB: [birth year]   Code Status: Full Code Admit date: 03/13/2022 Discharge date: 03/17/2022 Disposition: Home health, PT and OT PCP: [name], MD  Recommendations for Outpatient Follow-up:  Follow up with PCP within 1-2 weeks Regarding general hospital follow up and preventative care Recommend CBC, metabolic panel Follow up with nephrology  Regarding acute kidney injury Follow up with cardiology  Regarding heart failure  Discharge Diagnoses:  Principal Problem:   Influenza A Active Problems:   Chronic diastolic CHF (congestive heart failure) ([place])   DM2 (diabetes mellitus, type 2) ([place])   Atrial fibrillation, chronic (HCC)   Hyperlipidemia   GERD (gastroesophageal reflux disease)   AKI (acute kidney injury) ([place])   Acute respiratory failure with hypoxia (HCC)   Nausea and vomiting in adult   Candidiasis of penis   HFrEF (heart failure with reduced ejection fraction) [facility])  Brief Hospital Course Summary: [name] is a 89 y.o. male with a PMH significant for permanent A-fib, coronary artery disease status post CABG, heart failure with preserved ejection fraction, type 2 diabetes, CKD stage IIIb, hypertension. In November '23 he had a right caudate CVA with minimal sequellae and was able to return home. He subsequently had a bout of Covid 19 for which he was treated.   They presented from home to the ED on 03/13/2022 with cough x several days. He also had myalgias, fever, N/V.   In the ED, it was found that they had T 99.9, 157/73 HR 78 RR 16 O2 sat 96% on 2L [place].  Significant findings included Covid NEGATIVE, POSITIVE for inlfuenza A, Cr 1.9 ( close to baseline) WBC 7.1 w/ 85/5/9, U/A negative, Troponin 30, glucose 192. Chest CT showed:  Airway thickening with patchy air trapping and small ground-glass opacities, possible sequela of patient's recent COVID. No consolidating pneumonia   They were  initially treated with tamiflu, IV fluids, tylenol, and home medications. He required up to 2L [place].    He was able to wean to room air comfortably and completed tamiflu treatment.   Unfortunately, he experienced an AKI on his CKDIIIb while admitted in setting of acute illness. This was corrected with gentle IV fluids and PO hydration as to not exacerbate his heart failure.  Due to his decreased renal excretion, several medications were held or decreased doses as listed below.  On day of discharge his Cr was 2.05  Discharge Condition: Good, improved Recommended discharge diet: Cardiac diet  Consultations: None   Procedures/Studies: None   Allergies as of 03/17/2022       Reactions   Cortisone Other (See Comments)   Leg Cramps   Metformin Diarrhea        Medication List     STOP taking these medications    amoxicillin-clavulanate 875-125 MG tablet Commonly known as: AUGMENTIN   dapagliflozin propanediol 10 MG Tabs tablet Commonly known as: Farxiga   Entresto 24-26 MG Generic drug: sacubitril-valsartan   gabapentin 100 MG capsule Commonly known as: NEURONTIN   Lagevrio 200 MG Caps capsule Generic drug: molnupiravir EUA   rosuvastatin 20 MG tablet Commonly known as: CRESTOR   torsemide 20 MG tablet Commonly known as: DEMADEX   valsartan 320 MG tablet Commonly known as: DIOVAN       TAKE these medications    amiodarone 200 MG tablet Commonly known as: PACERONE Take 200 mg by mouth daily.   apixaban 2.5 MG Tabs tablet Commonly known as:  Eliquis Take 1 tablet (2.5 mg total) by mouth 2 (two) times daily. What changed:  medication strength how much to take   BD Pen Needle Nano U/F 32G X 4 MM Misc Generic drug: Insulin Pen Needle USE AS DIRECTED   benzonatate 200 MG capsule Commonly known as: TESSALON Take 1 capsule (200 mg total) by mouth 2 (two) times daily as needed for cough.   carvedilol 12.5 MG tablet Commonly known as: COREG TAKE ONE (1)  TABLET BY MOUTH TWO TIMES PER DAY What changed: See the new instructions.   clotrimazole 1 % cream Commonly known as: LOTRIMIN Apply topically 2 (two) times daily for 14 days.   doxazosin 2 MG tablet Commonly known as: CARDURA Take 1 tablet (2 mg total) by mouth 2 (two) times daily. Please keep upcoming appt for future refills   ezetimibe 10 MG tablet Commonly known as: ZETIA TAKE 1 TABLET BY MOUTH DAILY   finasteride 5 MG tablet Commonly known as: PROSCAR Take 1 tablet (5 mg total) by mouth daily. What changed: when to take this   guaiFENesin-codeine 100-10 MG/5ML syrup Take 10 mLs by mouth 3 (three) times daily as needed for cough.   hydrALAZINE 25 MG tablet Commonly known as: APRESOLINE Take 1 tablet (25 mg total) by mouth 3 (three) times daily.   isosorbide mononitrate 30 MG 24 hr tablet Commonly known as: IMDUR Take 1 tablet (30 mg total) by mouth daily. Please call [phone] for a sooner appointment and for further refills.   loperamide 2 MG tablet Commonly known as: Imodium A-D Take 1 tablet (2 mg total) by mouth 2 (two) times daily as needed for up to 60 doses for diarrhea or loose stools. What changed: when to take this   omeprazole 20 MG capsule Commonly known as: PRILOSEC Take 1 capsule (20 mg total) by mouth 2 (two) times daily.   potassium chloride 10 MEQ tablet Commonly known as: KLOR-CON Take 1 tablet (10 mEq total) by mouth daily as needed. Take with torsemide   PRESERVISION AREDS 2 PO Take 1 tablet by mouth in the morning and at bedtime.   Toujeo SoloStar 300 UNIT/ML Solostar Pen Generic drug: insulin glargine (1 Unit Dial) Inject 10 Units into the skin daily. What changed:  how much to take Another medication with the same name was removed. Continue taking this medication, and follow the directions you see here.   Vitamin D3 50 MCG (2000 UT) Tabs Take 2,000 Units by mouth in the morning.        Follow-up Information     [name], MD Follow up.   Specialty: Family Medicine Contact information: [place] [place] [address] [phone]                 Subjective   Pt reports feeling improved. He denies SOB or CP at rest or with exertion.  Endorses some mild lower extremity swelling and residual dry cough which has improved since admission.  Reviewed his medication changes with him and wife prior to dc.  All questions and concerns of patient and wife were addressed at time of discharge.  Objective  Blood pressure (!) 130/55, pulse (!) 51, temperature 97.9 F (36.6 C), resp. rate 20, height '6\' 1"'$  (1.854 m), weight 103.7 kg, SpO2 96 %.   General: Pt is alert, awake, not in acute distress Cardiovascular: RRR, S1/S2 +, no rubs, no gallops Respiratory: CTA bilaterally, no wheezing, no rhonchi Abdominal: Soft, NT, ND, bowel  sounds + Extremities: 1+ LE edema, no cyanosis  The results of significant diagnostics from this hospitalization (including imaging, microbiology, ancillary and laboratory) are listed below for reference.   Imaging studies: CT HEAD WO CONTRAST (5MM)  Result Date: 03/16/2022 CLINICAL DATA:  Initial evaluation for mental status change. Unknown cause. EXAM: CT HEAD WITHOUT CONTRAST TECHNIQUE: Contiguous axial images were obtained from the base of the skull through the vertex without intravenous contrast. RADIATION DOSE REDUCTION: This exam was performed according to the departmental dose-optimization program which includes automated exposure control, adjustment of the mA and/or kV according to patient size and/or use of iterative reconstruction technique. COMPARISON:  Prior study from 01/31/2022. FINDINGS: Brain: Age-related cerebral atrophy with mild chronic small vessel ischemic disease. No acute intracranial hemorrhage. No acute large vessel territory infarct. No mass lesion, mass effect or midline shift. No hydrocephalus or extra-axial fluid collection. Vascular: No  abnormal hyperdense vessel. Calcified atherosclerosis present at the skull base. Skull: Scalp soft tissues and calvarium demonstrate no acute finding. Sinuses/Orbits: Globes orbital soft tissues within normal limits. Chronic sphenoethmoidal and maxillary sinusitis, progressed from prior. Trace right mastoid effusion, of doubtful significance. Other: None. IMPRESSION: 1. No acute intracranial abnormality. 2. Age-related cerebral atrophy with mild chronic small vessel ischemic disease. 3. Chronic sphenoethmoidal and maxillary sinusitis, progressed from prior. Electronically Signed   By: Jeannine Boga M.D.   On: 03/16/2022 04:42   CT CHEST ABDOMEN PELVIS WO CONTRAST  Result Date: 03/13/2022 CLINICAL DATA:  Cough and congestion for multiple weeks. COVID 3 weeks ago. Sepsis. EXAM: CT CHEST, ABDOMEN AND PELVIS WITHOUT CONTRAST TECHNIQUE: Multidetector CT imaging of the chest, abdomen and pelvis was performed following the standard protocol without IV contrast. RADIATION DOSE REDUCTION: This exam was performed according to the departmental dose-optimization program which includes automated exposure control, adjustment of the mA and/or kV according to patient size and/or use of iterative reconstruction technique. COMPARISON:  12/25/2020 abdominal CT FINDINGS: CT CHEST FINDINGS Cardiovascular: Normal heart size. No pericardial effusion. Widespread atherosclerosis post CABG. Mild aortic valve calcification. No acute vascular finding without contrast. Mediastinum/Nodes: Negative for mass or adenopathy. Lungs/Pleura: There is no edema, consolidation, effusion, or pneumothorax. There are a few areas of peripheral ground-glass density especially in the lower lungs. Generalized airway thickening with patchy air trapping. Musculoskeletal: Advanced thoracic spine degeneration with multilevel bridging osteophyte. No acute finding. CT ABDOMEN PELVIS FINDINGS Hepatobiliary: No focal liver abnormality.Cholecystectomy Pancreas:  Unremarkable. Spleen: Unremarkable. Adrenals/Urinary Tract: Negative adrenals. No hydronephrosis or stone. Small left renal lesions which are cyst by prior enhanced CT, no imaging follow-up recommended. Generalized and symmetric cortical thinning. Unremarkable bladder. Stomach/Bowel:  No obstruction. No visible bowel inflammation. Vascular/Lymphatic: No acute vascular abnormality. Confluent atheromatous calcification of the aorta with extensive branch vessel plaque. No mass or adenopathy. Reproductive:Symmetric enlargement of the prostate, chronic. There is partial obscuration from right hip artifact. Other: No ascites or pneumoperitoneum. Musculoskeletal: Right hip prosthesis with unavoidable streak artifact. Generalized advanced lumbar spine degeneration with multilevel ankylosis. No superimposed acute finding. IMPRESSION: 1. Airway thickening with patchy air trapping and small ground-glass opacities, possible sequela of patient's recent COVID. No consolidating pneumonia. 2. No acute intra-abdominal finding. 3. Chronic findings are described above. Electronically Signed   By: Jorje Guild M.D.   On: 03/13/2022 04:55   DG Chest 2 View  Result Date: 03/13/2022 CLINICAL DATA:  COVID positive 3 weeks ago with chest pain and productive cough. EXAM: CHEST - 2 VIEW COMPARISON:  March 10, 2022 FINDINGS: Multiple sternal wires and  vascular clips are noted. The heart size and mediastinal contours are within normal limits. There is marked severity calcification of the aortic arch. Both lungs are clear. The visualized skeletal structures are unremarkable. IMPRESSION: 1. Evidence of prior median sternotomy/CABG. 2. No acute cardiopulmonary disease. Electronically Signed   By: Virgina Norfolk M.D.   On: 03/13/2022 01:09   DG Chest 2 View  Result Date: 03/11/2022 CLINICAL DATA:  Cough for 6 weeks. EXAM: CHEST - 2 VIEW COMPARISON:  04/20/2021. FINDINGS: The heart size and mediastinal contours are within normal limits.  There is atherosclerotic calcification of the aorta. Mild blunting of the costophrenic angles bilaterally, possible trace bilateral pleural effusions. No pneumothorax is seen. Degenerative changes are present in the thoracic spine. Sternotomy wires are noted. IMPRESSION: Mild blunting of the costophrenic angles bilaterally, possible trace pleural effusions. Electronically Signed   By: Brett Fairy M.D.   On: 03/11/2022 04:12    Labs: Basic Metabolic Panel: Recent Labs  Lab 03/13/22 0034 03/14/22 0829 03/15/22 0538 03/16/22 0228 03/17/22 0552  NA 137 134* 134* 132* 136  K 4.0 4.0 3.8 3.2* 3.8  CL 106 105 104 104 108  CO2 '22 22 22 22 22  '$ GLUCOSE 192* 106* 124* 150* 61*  BUN 22 33* 41* 42* 39*  CREATININE 1.90* 2.29* 2.50* 2.63* 2.05*  CALCIUM 8.5* 8.0* 7.9* 7.6* 7.7*   CBC: Recent Labs  Lab 03/13/22 0034 03/14/22 0829 03/15/22 0538 03/16/22 0228 03/17/22 0552  WBC 7.1 5.0 5.1 4.2 3.6*  NEUTROABS 6.1  --   --   --   --   HGB 10.9* 10.9* 10.9* 10.1* 10.0*  HCT 32.9* 32.9* 32.7* 29.5* 29.6*  MCV 97.1 97.9 95.1 93.4 93.7  PLT 90* 85* 80* 68* 67*   Microbiology: Results for orders placed or performed during the hospital encounter of 03/13/22  Resp panel by RT-PCR (RSV, Flu A&B, Covid) Anterior Nasal Swab     Status: Abnormal   Collection Time: 03/13/22 12:34 AM   Specimen: Anterior Nasal Swab  Result Value Ref Range Status   SARS Coronavirus 2 by RT PCR NEGATIVE NEGATIVE Final    Comment: (NOTE) SARS-CoV-2 target nucleic acids are NOT DETECTED.  The SARS-CoV-2 RNA is generally detectable in upper respiratory specimens during the acute phase of infection. The lowest concentration of SARS-CoV-2 viral copies this assay can detect is 138 copies/mL. A negative result does not preclude SARS-Cov-2 infection and should not be used as the sole basis for treatment or other patient management decisions. A negative result may occur with  improper specimen collection/handling,  submission of specimen other than nasopharyngeal swab, presence of viral mutation(s) within the areas targeted by this assay, and inadequate number of viral copies(<138 copies/mL). A negative result must be combined with clinical observations, patient history, and epidemiological information. The expected result is Negative.  Fact Sheet for Patients:  EntrepreneurPulse.com.au  Fact Sheet for Healthcare Providers:  IncredibleEmployment.be  This test is no t yet approved or cleared by the [place] FDA and  has been authorized for detection and/or diagnosis of SARS-CoV-2 by FDA under an Emergency Use Authorization (EUA). This EUA will remain  in effect (meaning this test can be used) for the duration of the COVID-19 declaration under Section 564(b)(1) of the Act, 21 U.S.C.section 360bbb-3(b)(1), unless the authorization is terminated  or revoked sooner.       Influenza A by PCR POSITIVE (A) NEGATIVE Final   Influenza B by PCR NEGATIVE NEGATIVE Final    Comment: (  NOTE) The Xpert Xpress SARS-CoV-2/FLU/RSV plus assay is intended as an aid in the diagnosis of influenza from Nasopharyngeal swab specimens and should not be used as a sole basis for treatment. Nasal washings and aspirates are unacceptable for Xpert Xpress SARS-CoV-2/FLU/RSV testing.  Fact Sheet for Patients: EntrepreneurPulse.com.au  Fact Sheet for Healthcare Providers: IncredibleEmployment.be  This test is not yet approved or cleared by the [place] FDA and has been authorized for detection and/or diagnosis of SARS-CoV-2 by FDA under an Emergency Use Authorization (EUA). This EUA will remain in effect (meaning this test can be used) for the duration of the COVID-19 declaration under Section 564(b)(1) of the Act, 21 U.S.C. section 360bbb-3(b)(1), unless the authorization is terminated or revoked.     Resp Syncytial Virus by PCR  NEGATIVE NEGATIVE Final    Comment: (NOTE) Fact Sheet for Patients: EntrepreneurPulse.com.au  Fact Sheet for Healthcare Providers: IncredibleEmployment.be  This test is not yet approved or cleared by the [place] FDA and has been authorized for detection and/or diagnosis of SARS-CoV-2 by FDA under an Emergency Use Authorization (EUA). This EUA will remain in effect (meaning this test can be used) for the duration of the COVID-19 declaration under Section 564(b)(1) of the Act, 21 U.S.C. section 360bbb-3(b)(1), unless the authorization is terminated or revoked.  Performed at [facility], [address] [place], [place] [address]   Culture, blood (Routine X 2) w Reflex to ID Panel     Status: None   Collection Time: 03/13/22  4:56 PM   Specimen: BLOOD RIGHT HAND  Result Value Ref Range Status   Specimen Description BLOOD RIGHT HAND  Final   Special Requests   Final    BOTTLES DRAWN AEROBIC AND ANAEROBIC Blood Culture adequate volume   Culture   Final    NO GROWTH 5 DAYS Performed at [facility], [address], [place], [place] [address]    Report Status 03/18/2022 FINAL  Final  Culture, blood (Routine X 2) w Reflex to ID Panel     Status: None   Collection Time: 03/13/22  5:01 PM   Specimen: BLOOD LEFT HAND  Result Value Ref Range Status   Specimen Description BLOOD LEFT HAND  Final   Special Requests   Final    BOTTLES DRAWN AEROBIC AND ANAEROBIC Blood Culture adequate volume   Culture   Final    NO GROWTH 5 DAYS Performed at [facility], [address] [place], [place] [address]    Report Status 03/18/2022 FINAL  Final   Time coordinating discharge: Over 30 minutes  Richarda Osmond, MD  [facility] 03/17/2022, 8:44 AM

## 2022-03-17 NOTE — Telephone Encounter (Signed)
Kelsey from [facility] wants to know if the provider will follow pt with home health orders

## 2022-03-17 NOTE — Care Management Important Message (Signed)
Important Message  Patient Details  Name: [name] [name] MRN: [id number] Date of Birth: [birth year]   Medicare Important Message Given:  Yes  Patient is in an isolation room so I reviewed the Important Message from Medicare with him over the phone [phone]). He is agreement with his discharge today.  I wished him a speedy recovery and thanked him for his time.   Juliann Pulse A Standley Bargo 03/17/2022, 9:14 AM

## 2022-03-17 NOTE — Telephone Encounter (Signed)
I called kelsey back from [facility] and informed her that you were not here today but I did not think it would be a problem of you following the patient for home health orders, if that was not correct please let me know and I will call her back at [phone].  Thanks Nicie Milan,cma

## 2022-03-17 NOTE — Progress Notes (Incomplete)
PROGRESS NOTE  [name] [name]    DOB: 07/22/1933, 89 y.o.  DJS:[id number]    Code Status: Full Code   DOA: 03/13/2022   LOS: 4   Brief hospital course  [name] [name] is a 89 y.o. male with a PMH significant for permanent A-fib, coronary artery disease status post CABG, heart failure with preserved ejection fraction, type 2 diabetes, CKD stage IIIb, hypertension. In November '23 he had a right caudate CVA with minimal sequellae and was able to return home. He subsequently had a bout of Covid 19 for which he was treated.  They presented from home to the ED on 03/13/2022 with cough x several days. He also had myalgias, fever, N/V.  In the ED, it was found that they had T 99.9, 157/73 HR 78 RR 16 O2 sat 96% on 2L [place].  Significant findings included Covid NEGATIVE, POSITIVE for inlfuenza A, Cr 1.9 ( close to baseline) WBC 7.1 w/ 85/5/9, U/A negative, Troponin 30, glucose 192. Chest CT showed:  Airway thickening with patchy air trapping and small ground-glass opacities, possible sequela of patient's recent COVID. No consolidating pneumonia  They were initially treated with tamiflu, IV fluids, tylenol, and home medications. He required up to 2L [place].    Patient was admitted to medicine service for further workup and management of influenza A as outlined in detail below.  03/17/22 -stable respiratory status. Worsened AKI. Overnight event o  Assessment & Plan  Principal Problem:   Influenza A Active Problems:   Chronic diastolic CHF (congestive heart failure) (HCC)   DM2 (diabetes mellitus, type 2) (HCC)   Atrial fibrillation, chronic (HCC)   Hyperlipidemia   GERD (gastroesophageal reflux disease)   AKI (acute kidney injury) ([place])   Acute respiratory failure with hypoxia (HCC)   Nausea and vomiting in adult   Candidiasis of penis   HFrEF (heart failure with reduced ejection fraction) ([place])  AKI on CKDII- likely in relation to dehydration in acute illness setting. Cr 1.9>2.29>2.5>2.63 from  baseline of about 1.5. actually has appointment to establish OP nephrology care for CKD - encourage PO hydration. IV fluids have been discontinued in setting of HF and LE swelling - BMP am.  - holding statin and entresto, decreased eliquis  DM2- Hypoglycemia overnight- episode of bg 23 overnight with correction with dextrose. Due to a combination of poor PO intake in hospital due to acute illness and having insulin administered at night. Already had decreased insulin dose from home regimen.  - further decrease insulin dose and move to morning administration for increased safety profile Lat A1C from July '23 was 7.9 % - Sliding scale coverage - hgb A1c added on  Candidiasis of penis- reported to have began prior to admission - stop farxiga - topical clotrimazole   Influenza A Patient with presentation of myalgias, persistent cough, fever, N/V and a respiratory panel was positive for influenza A. At presentation he was hypoxemic and required oxygen supplementation up to 2L[place]. Now resting comfortably on room air. Airways appear clear on exam.  - will need evaluation of ambulation with pulse ox prior to dc.  - continue Tamiflu 75 mg BID - tylenol, zofran, mucinex, honey PRN - PT/OT   Afib  CAD s/p CABG and PCI, HFpEF and HTN-  Patient with stable persistent atrial fibrillation. He has been well managed medically and has controlled rate at admission and no chest pain. States his dry weight is 210-213. - Continue all home cardiac medications including Eliquis at reduced dose  for renal function - TED hose, encourage ambulation - strict I/O - daily weights   GERD (gastroesophageal reflux disease) Patient with N/V related to influenza. Denies active reflux or GI pain. - Continue PPI tx.   Hyperlipidemia Stable - stopped statin for decreased kidney function  Body mass index is 30.16 kg/m.  VTE ppx: Place TED hose Start: 03/16/22 0948 apixaban (ELIQUIS) tablet 2.5 mg Start: 03/14/22  2200 apixaban (ELIQUIS) tablet 2.5 mg   Diet:     Diet   Diet heart healthy/carb modified Room service appropriate? Yes; Fluid consistency: Thin   Consultants: None   Subjective 03/17/22    Pt reports feeling well today. Denies SOB, CP.  He does not remember the hypoglycemic episode overnight.  He states that he has some redness and irritation at his penis head and is uncircumcised.    Objective   Vitals:   03/16/22 1553 03/16/22 2034 03/17/22 0437 03/17/22 0438  BP: (!) 141/92 (!) 140/59 (!) 130/55   Pulse: (!) 58 (!) 52 (!) 51   Resp: '18 18 20   '$ Temp: 97.8 F (36.6 C) 98 F (36.7 C) 97.9 F (36.6 C)   TempSrc: Oral Oral    SpO2: 99% 99% 96%   Weight:    103.7 kg  Height:        Intake/Output Summary (Last 24 hours) at 03/17/2022 0704 Last data filed at 03/17/2022 0439 Gross per 24 hour  Intake 214.21 ml  Output 200 ml  Net 14.21 ml    Filed Weights   03/13/22 0029 03/17/22 0438  Weight: 96.6 kg 103.7 kg     Physical Exam:  General: awake, alert, NAD HEENT: atraumatic, clear conjunctiva, anicteric sclera, MMM, hearing grossly normal Respiratory: normal respiratory effort. CTAB Cardiovascular: quick capillary refill, normal S1/S2, RRR, no JVD, murmurs Nervous: A&O x3. no gross focal neurologic deficits, normal speech Extremities: moves all equally, no edema, normal tone Skin: dry, intact, normal temperature, normal color. No rashes, lesions or ulcers on exposed skin Psychiatry: normal mood, congruent affect  Labs   I have personally reviewed the following labs and imaging studies CBC    Component Value Date/Time   WBC 4.2 03/16/2022 0228   RBC 3.16 (L) 03/16/2022 0228   HGB 10.1 (L) 03/16/2022 0228   HGB 12.1 (L) 12/03/2020 1414   HCT 29.5 (L) 03/16/2022 0228   HCT 36.3 (L) 12/03/2020 1414   PLT 68 (L) 03/16/2022 0228   PLT 95 (LL) 12/03/2020 1414   MCV 93.4 03/16/2022 0228   MCV 95 12/03/2020 1414   MCV 92 02/22/2013 1654   MCH 32.0 03/16/2022  0228   MCHC 34.2 03/16/2022 0228   RDW 13.7 03/16/2022 0228   RDW 12.5 12/03/2020 1414   RDW 13.3 02/22/2013 1654   LYMPHSABS 0.3 (L) 03/13/2022 0034   MONOABS 0.6 03/13/2022 0034   EOSABS 0.1 03/13/2022 0034   BASOSABS 0.0 03/13/2022 0034      Latest Ref Rng & Units 03/17/2022    5:52 AM 03/16/2022    2:28 AM 03/15/2022    5:38 AM  BMP  Glucose 70 - 99 mg/dL 61  150  124   BUN 8 - 23 mg/dL 39  42  41   Creatinine 0.61 - 1.24 mg/dL 2.05  2.63  2.50   Sodium 135 - 145 mmol/L 136  132  134   Potassium 3.5 - 5.1 mmol/L 3.8  3.2  3.8   Chloride 98 - 111 mmol/L 108  104  104  CO2 22 - 32 mmol/L '22  22  22   '$ Calcium 8.9 - 10.3 mg/dL 7.7  7.6  7.9    Disposition Plan & Communication  Patient status: Inpatient  Admitted From: Home Planned disposition location: Home Anticipated discharge date: 1/11 pending resolution of AKI and maintain O2 saturations while ambulating  Family Communication: wifeat bedside    Author: Richarda Osmond, DO [facility] 03/17/2022, 7:04 AM   Available by Epic secure chat 7AM-7PM. If 7PM-7AM, please contact night-coverage.  [facility] contact information found on [email or web address].

## 2022-03-17 NOTE — Discharge Instructions (Signed)
Follow up with your PCP within 1 week to recheck your kidney function and blood sugars. They may adjust our medications at that time. Follow up with endocrinology for further diabetes control and discuss a glucose monitor.

## 2022-03-17 NOTE — TOC Initial Note (Signed)
Transition of Care [facility]) - Initial/Assessment Note    Patient Details  Name: [name] [name] MRN: [id number] Date of Birth: Nov 02, 1933  Transition of Care [facility]) CM/SW Contact:    Gerilyn Pilgrim, LCSW Phone Number: 03/17/2022, 8:55 AM  Clinical Narrative: SW consulted for [facility] needs. [facility] able to accept Pt for [facility] PT/OT.                  Expected Discharge Plan: [place] Barriers to Discharge: Continued Medical Work up   Patient Goals and CMS Choice Patient states their goals for this hospitalization and ongoing recovery are:: return home with [facility]   Choice offered to / list presented to : Patient      Expected Discharge Plan and Services         Expected Discharge Date: 03/17/22                                    Prior Living Arrangements/Services       Do you feel safe going back to the place where you live?: Yes               Activities of Daily Living Home Assistive Devices/Equipment: Walker (specify type), Cane (specify quad or straight), Wheelchair, Eyeglasses, Dentures (specify type), Scales ADL Screening (condition at time of admission) Patient's cognitive ability adequate to safely complete daily activities?: Yes Is the patient deaf or have difficulty hearing?: No Does the patient have difficulty seeing, even when wearing glasses/contacts?: No Does the patient have difficulty concentrating, remembering, or making decisions?: No Patient able to express need for assistance with ADLs?: Yes Does the patient have difficulty dressing or bathing?: No Independently performs ADLs?: Yes (appropriate for developmental age) Does the patient have difficulty walking or climbing stairs?: No Weakness of Legs: None Weakness of Arms/Hands: None  Permission Sought/Granted                  Emotional Assessment              Admission diagnosis:  Influenza A [J10.1] Acute respiratory failure with hypoxia (HCC) [J96.01] Nausea  and vomiting in adult [R11.2] Patient Active Problem List   Diagnosis Date Noted   Candidiasis of penis 03/16/2022   HFrEF (heart failure with reduced ejection fraction) ([place]) 03/16/2022   Acute respiratory failure with hypoxia ([place]) 03/14/2022   Nausea and vomiting in adult 03/14/2022   Influenza A 03/13/2022   Bronchitis 03/10/2022   Laceration of right hand without foreign body 02/07/2022   Acute CVA (cerebrovascular accident) ([place]) 01/26/2022   Hand crush injury, right, sequela 01/26/2022   Atrial fibrillation, chronic ([place]) 01/26/2022   Atrial flutter ([place]) 11/05/2021   Chronic anticoagulation 11/05/2021   Left leg swelling 06/09/2021   Acute cough 04/08/2021   Weakness 02/03/2021   Acute on chronic heart failure with preserved ejection fraction (HFpEF) ([place]) 12/09/2020   De Quervain's tenosynovitis, left 10/05/2020   Abnormal MRI, lumbar spine 08/12/2020   History of COVID-19 07/22/2020   AKI (acute kidney injury) ([place]) 04/08/2020   Scrotal lesion 03/12/2020   Cellulitis 05/12/2019   Epigastric pain 05/09/2019   Postherpetic neuralgia 05/09/2019   B12 deficiency 05/09/2019   Pulmonary hypertension, unspecified ([place]) 04/05/2019   Proteinuria, unspecified 04/05/2019   Atypical chest pain 01/08/2019   Weight loss 12/01/2018   Vertigo 10/15/2018   Diabetic retinopathy ([place]) 09/20/2018   Chronic low back pain  03/05/2018   Abnormal breast exam 01/18/2018   PAC (premature atrial contraction) 01/18/2018   Neuropathic pain 01/18/2018   Acute kidney injury superimposed on chronic kidney disease ([place]) 01/07/2018   Bell's palsy 08/18/2017   H/O cold sores 08/18/2017   Insomnia 11/10/2016   PLMD (periodic limb movement disorder) 08/17/2016   Mild OSA 08/17/2016   Intolerance of continuous positive airway pressure (CPAP) ventilation 08/17/2016   Chronic arthritis 06/02/2016   CKD stage 3 due to type 2 diabetes mellitus ([place]) 17/49/4496   Diastolic dysfunction 75/91/6384    Presence of aortocoronary bypass graft 05/04/2016   Chronic pain of both knees 10/06/2015   Sensory abnormality of thoracic dermatome distribution 10/06/2015   Spondylosis of lumbar region without myelopathy or radiculopathy 10/06/2015   S/P CABG x 4    Shortness of breath 05/14/2015   Thrombocytopenia ([place]) 02/07/2015   DM2 (diabetes mellitus, type 2) ([place]) 02/06/2015   Chronic diastolic CHF (congestive heart failure) ([place]) 01/01/2014   CAD (coronary artery disease) 06/03/2013   Diabetic neuropathy ([place]) 11/12/2012   BPH (benign prostatic hyperplasia)    GERD (gastroesophageal reflux disease)    Hyperlipidemia 09/15/2009   Essential hypertension 09/15/2009   PCP:  [name], MD Pharmacy:   [place], [place] - [address] [place] [address] Phone: [phone] Fax: [phone]     Social Determinants of Health (SDOH) Social History: SDOH Screenings   Food Insecurity: No Food Insecurity (03/14/2022)  Housing: Low Risk  (03/14/2022)  Transportation Needs: No Transportation Needs (03/14/2022)  Utilities: Not At Risk (03/14/2022)  Depression (PHQ2-9): Low Risk  (02/04/2022)  Financial Resource Strain: Low Risk  (07/07/2021)  Physical Activity: Sufficiently Active (07/03/2020)  Social Connections: Unknown (07/07/2021)  Stress: No Stress Concern Present (07/07/2021)  Tobacco Use: Low Risk  (03/14/2022)   SDOH Interventions:     Readmission Risk Interventions    03/15/2022   10:14 AM  Readmission Risk Prevention Plan  Transportation Screening Complete  PCP or Specialist Appt within 3-5 Days Complete  Medication Review Press photographer) Complete

## 2022-03-18 ENCOUNTER — Telehealth: Payer: Self-pay

## 2022-03-18 ENCOUNTER — Telehealth: Payer: Self-pay | Admitting: Family Medicine

## 2022-03-18 LAB — CULTURE, BLOOD (ROUTINE X 2)
Culture: NO GROWTH
Culture: NO GROWTH
Special Requests: ADEQUATE
Special Requests: ADEQUATE

## 2022-03-18 NOTE — Telephone Encounter (Signed)
I will forward to Rasheedah to send the previously placed order to [facility] endocrinology

## 2022-03-18 NOTE — Telephone Encounter (Signed)
Referral for Endocrinolgy at [facility] Fax # [phone]. The patient stated if the referral could be faxed over  he could be seen in the next few day.

## 2022-03-18 NOTE — Patient Outreach (Signed)
  Care Coordination [facility] Note Transition Care Management Follow-up Telephone Call Date of discharge and from where: [facility] 03/17/22 How have you been since you were released from the hospital? Per patients wife he is doing much better but still weak. Any questions or concerns? No  Items Reviewed: Did the pt receive and understand the discharge instructions provided? Yes  Medications obtained and verified? Yes  Other? No  Any new allergies since your discharge? No  Dietary orders reviewed? Yes Do you have support at home? Yes   Home Care and Equipment/Supplies: Were home health services ordered? yes If so, what is the name of the agency? [facility]  Has the agency set up a time to come to the patient's home? yes Were any new equipment or medical supplies ordered?  No What is the name of the medical supply agency? N/a Were you able to get the supplies/equipment? not applicable Do you have any questions related to the use of the equipment or supplies? No  Functional Questionnaire: (I = Independent and D = Dependent) ADLs: I  Bathing/Dressing- I  Meal Prep- D  Eating- I  Maintaining continence- I  Transferring/Ambulation- I  Managing Meds- I  Follow up appointments reviewed:  PCP Hospital f/u appt confirmed? Yes  Scheduled to see Dr. [name] on 03/23/22 @ 11:00. [facility] f/u appt confirmed? Yes  Scheduled to see Dr. Leonie Man on 03/28/22 @ 11:00. Are transportation arrangements needed? No  If their condition worsens, is the pt aware to call PCP or go to the Emergency Dept.? Yes Was the patient provided with contact information for the PCP's office or ED? Yes Was to pt encouraged to call back with questions or concerns? Yes  SDOH assessments and interventions completed:   Yes SDOH Interventions Today    Flowsheet Row Most Recent Value  SDOH Interventions   Food Insecurity Interventions Intervention Not Indicated  Housing Interventions Intervention Not Indicated        Care Coordination Interventions:  No Care Coordination interventions needed at this time.   Encounter Outcome:  Pt. Visit Completed

## 2022-03-21 DIAGNOSIS — I4821 Permanent atrial fibrillation: Secondary | ICD-10-CM | POA: Diagnosis not present

## 2022-03-21 DIAGNOSIS — Z794 Long term (current) use of insulin: Secondary | ICD-10-CM | POA: Diagnosis not present

## 2022-03-21 DIAGNOSIS — N179 Acute kidney failure, unspecified: Secondary | ICD-10-CM | POA: Diagnosis not present

## 2022-03-21 DIAGNOSIS — Z8616 Personal history of COVID-19: Secondary | ICD-10-CM | POA: Diagnosis not present

## 2022-03-21 DIAGNOSIS — B3749 Other urogenital candidiasis: Secondary | ICD-10-CM | POA: Diagnosis not present

## 2022-03-21 DIAGNOSIS — Z9181 History of falling: Secondary | ICD-10-CM | POA: Diagnosis not present

## 2022-03-21 DIAGNOSIS — J9601 Acute respiratory failure with hypoxia: Secondary | ICD-10-CM | POA: Diagnosis not present

## 2022-03-21 DIAGNOSIS — Z8673 Personal history of transient ischemic attack (TIA), and cerebral infarction without residual deficits: Secondary | ICD-10-CM | POA: Diagnosis not present

## 2022-03-21 DIAGNOSIS — E785 Hyperlipidemia, unspecified: Secondary | ICD-10-CM | POA: Diagnosis not present

## 2022-03-21 DIAGNOSIS — I130 Hypertensive heart and chronic kidney disease with heart failure and stage 1 through stage 4 chronic kidney disease, or unspecified chronic kidney disease: Secondary | ICD-10-CM | POA: Diagnosis not present

## 2022-03-21 DIAGNOSIS — N1832 Chronic kidney disease, stage 3b: Secondary | ICD-10-CM | POA: Diagnosis not present

## 2022-03-21 DIAGNOSIS — E1122 Type 2 diabetes mellitus with diabetic chronic kidney disease: Secondary | ICD-10-CM | POA: Diagnosis not present

## 2022-03-21 DIAGNOSIS — Z7901 Long term (current) use of anticoagulants: Secondary | ICD-10-CM | POA: Diagnosis not present

## 2022-03-21 DIAGNOSIS — I5032 Chronic diastolic (congestive) heart failure: Secondary | ICD-10-CM | POA: Diagnosis not present

## 2022-03-21 DIAGNOSIS — Z951 Presence of aortocoronary bypass graft: Secondary | ICD-10-CM | POA: Diagnosis not present

## 2022-03-21 DIAGNOSIS — Z5982 Transportation insecurity: Secondary | ICD-10-CM | POA: Diagnosis not present

## 2022-03-21 DIAGNOSIS — K219 Gastro-esophageal reflux disease without esophagitis: Secondary | ICD-10-CM | POA: Diagnosis not present

## 2022-03-21 DIAGNOSIS — I2510 Atherosclerotic heart disease of native coronary artery without angina pectoris: Secondary | ICD-10-CM | POA: Diagnosis not present

## 2022-03-21 DIAGNOSIS — E11649 Type 2 diabetes mellitus with hypoglycemia without coma: Secondary | ICD-10-CM | POA: Diagnosis not present

## 2022-03-21 NOTE — Telephone Encounter (Signed)
Pt called in staying he would like to know the status of the endo-referral. I read the note below to him, that [place] sent it over this morning to [facility] endo. He's aware of this.

## 2022-03-22 ENCOUNTER — Emergency Department: Payer: Medicare Other

## 2022-03-22 ENCOUNTER — Other Ambulatory Visit: Payer: Self-pay

## 2022-03-22 ENCOUNTER — Encounter: Payer: Self-pay | Admitting: Emergency Medicine

## 2022-03-22 ENCOUNTER — Emergency Department
Admission: EM | Admit: 2022-03-22 | Discharge: 2022-03-22 | Disposition: A | Discharge status: Home / Self Care | Payer: Medicare Other | Attending: Emergency Medicine | Admitting: Emergency Medicine

## 2022-03-22 DIAGNOSIS — I130 Hypertensive heart and chronic kidney disease with heart failure and stage 1 through stage 4 chronic kidney disease, or unspecified chronic kidney disease: Secondary | ICD-10-CM | POA: Diagnosis not present

## 2022-03-22 DIAGNOSIS — E1122 Type 2 diabetes mellitus with diabetic chronic kidney disease: Secondary | ICD-10-CM | POA: Insufficient documentation

## 2022-03-22 DIAGNOSIS — J40 Bronchitis, not specified as acute or chronic: Secondary | ICD-10-CM

## 2022-03-22 DIAGNOSIS — R079 Chest pain, unspecified: Secondary | ICD-10-CM | POA: Diagnosis not present

## 2022-03-22 DIAGNOSIS — I4510 Unspecified right bundle-branch block: Secondary | ICD-10-CM | POA: Diagnosis not present

## 2022-03-22 DIAGNOSIS — R197 Diarrhea, unspecified: Secondary | ICD-10-CM | POA: Diagnosis not present

## 2022-03-22 DIAGNOSIS — I499 Cardiac arrhythmia, unspecified: Secondary | ICD-10-CM | POA: Diagnosis not present

## 2022-03-22 DIAGNOSIS — N183 Chronic kidney disease, stage 3 unspecified: Secondary | ICD-10-CM | POA: Diagnosis not present

## 2022-03-22 DIAGNOSIS — R6889 Other general symptoms and signs: Secondary | ICD-10-CM | POA: Diagnosis not present

## 2022-03-22 DIAGNOSIS — R110 Nausea: Secondary | ICD-10-CM | POA: Diagnosis not present

## 2022-03-22 DIAGNOSIS — I2510 Atherosclerotic heart disease of native coronary artery without angina pectoris: Secondary | ICD-10-CM | POA: Diagnosis not present

## 2022-03-22 DIAGNOSIS — I5020 Unspecified systolic (congestive) heart failure: Secondary | ICD-10-CM | POA: Insufficient documentation

## 2022-03-22 DIAGNOSIS — R1111 Vomiting without nausea: Secondary | ICD-10-CM | POA: Diagnosis not present

## 2022-03-22 DIAGNOSIS — Z8673 Personal history of transient ischemic attack (TIA), and cerebral infarction without residual deficits: Secondary | ICD-10-CM | POA: Diagnosis not present

## 2022-03-22 DIAGNOSIS — R0602 Shortness of breath: Secondary | ICD-10-CM | POA: Insufficient documentation

## 2022-03-22 DIAGNOSIS — R112 Nausea with vomiting, unspecified: Secondary | ICD-10-CM | POA: Diagnosis not present

## 2022-03-22 DIAGNOSIS — R7989 Other specified abnormal findings of blood chemistry: Secondary | ICD-10-CM | POA: Insufficient documentation

## 2022-03-22 DIAGNOSIS — Z743 Need for continuous supervision: Secondary | ICD-10-CM | POA: Diagnosis not present

## 2022-03-22 LAB — URINALYSIS, ROUTINE W REFLEX MICROSCOPIC
Bilirubin Urine: NEGATIVE
Glucose, UA: 50 mg/dL — AB
Hgb urine dipstick: NEGATIVE
Ketones, ur: 5 mg/dL — AB
Leukocytes,Ua: NEGATIVE
Nitrite: NEGATIVE
Protein, ur: 30 mg/dL — AB
Specific Gravity, Urine: 1.015 (ref 1.005–1.030)
pH: 5 (ref 5.0–8.0)

## 2022-03-22 LAB — BASIC METABOLIC PANEL
Anion gap: 5 (ref 5–15)
BUN: 18 mg/dL (ref 8–23)
CO2: 24 mmol/L (ref 22–32)
Calcium: 8 mg/dL — ABNORMAL LOW (ref 8.9–10.3)
Chloride: 107 mmol/L (ref 98–111)
Creatinine, Ser: 1.46 mg/dL — ABNORMAL HIGH (ref 0.61–1.24)
GFR, Estimated: 47 mL/min — ABNORMAL LOW (ref >60)
Glucose, Bld: 208 mg/dL — ABNORMAL HIGH (ref 70–99)
Potassium: 4.3 mmol/L (ref 3.5–5.1)
Sodium: 136 mmol/L (ref 135–145)

## 2022-03-22 LAB — CBC
HCT: 32.6 % — ABNORMAL LOW (ref 39.0–52.0)
Hemoglobin: 10.7 g/dL — ABNORMAL LOW (ref 13.0–17.0)
MCH: 31.5 pg (ref 26.0–34.0)
MCHC: 32.8 g/dL (ref 30.0–36.0)
MCV: 95.9 fL (ref 80.0–100.0)
Platelets: 112 10*3/uL — ABNORMAL LOW (ref 150–400)
RBC: 3.4 MIL/uL — ABNORMAL LOW (ref 4.22–5.81)
RDW: 13.4 % (ref 11.5–15.5)
WBC: 8.5 10*3/uL (ref 4.0–10.5)
nRBC: 0 % (ref 0.0–0.2)

## 2022-03-22 LAB — HEPATIC FUNCTION PANEL
ALT: 21 U/L (ref 0–44)
AST: 27 U/L (ref 15–41)
Albumin: 3.3 g/dL — ABNORMAL LOW (ref 3.5–5.0)
Alkaline Phosphatase: 102 U/L (ref 38–126)
Bilirubin, Direct: 0.2 mg/dL (ref 0.0–0.2)
Indirect Bilirubin: 1 mg/dL — ABNORMAL HIGH (ref 0.3–0.9)
Total Bilirubin: 1.2 mg/dL (ref 0.3–1.2)
Total Protein: 6.4 g/dL — ABNORMAL LOW (ref 6.5–8.1)

## 2022-03-22 LAB — TROPONIN I (HIGH SENSITIVITY): Troponin I (High Sensitivity): 12 ng/L (ref <18)

## 2022-03-22 MED ORDER — DOXYCYCLINE MONOHYDRATE 100 MG PO TABS: 100.0000 mg | ORAL_TABLET | Freq: Two times a day (BID) | ORAL | 0 refills | Status: AC

## 2022-03-22 MED ORDER — ONDANSETRON HCL 4 MG/2ML IJ SOLN
4.0000 mg | Freq: Once | INTRAMUSCULAR | Status: AC
  Administered 2022-03-22: 4 mg via INTRAVENOUS
  Filled 2022-03-22: qty 2

## 2022-03-22 MED ORDER — LOPERAMIDE HCL 2 MG PO TABS: 2.0000 mg | ORAL_TABLET | Freq: Three times a day (TID) | ORAL | 0 refills | Status: AC | PRN

## 2022-03-22 MED ORDER — AMOXICILLIN-POT CLAVULANATE 875-125 MG PO TABS: 1.0000 | ORAL_TABLET | Freq: Two times a day (BID) | ORAL | 0 refills | Status: AC

## 2022-03-22 MED ORDER — ONDANSETRON 4 MG PO TBDP: 4.0000 mg | ORAL_TABLET | Freq: Three times a day (TID) | ORAL | 0 refills | Status: DC | PRN

## 2022-03-22 MED ORDER — SODIUM CHLORIDE 0.9 % IV BOLUS
500.0000 mL | Freq: Once | INTRAVENOUS | Status: AC
  Administered 2022-03-22: 500 mL via INTRAVENOUS

## 2022-03-22 MED ORDER — GUAIFENESIN-CODEINE 100-10 MG/5ML PO SOLN: 10.0000 mL | Freq: Three times a day (TID) | ORAL | 0 refills | Status: DC | PRN

## 2022-03-22 NOTE — Discharge Instructions (Addendum)
-  I suspect that your symptoms are likely related to a stomach bug.  Your symptoms should improve over the next 48 to 72 hours.  You may take the ondansetron as needed for nausea/vomiting.  You may take the loperamide as needed for the diarrhea.  Recommend frequent hydration, preferably with electrolyte solutions.  -As possible given your recent viral infections in your persistent cough/mild shortness of breath that you may be developing an early pneumonia that the chest x-ray did not find.  If you begin to experience persistent symptoms beyond 2 to 3 days, particularly fever, worsening shortness of breath, productive cough, or bodyaches, you may take the antibiotics that have provided (amoxicillin and doxycycline).  -Please follow-up with your primary care provider this week as discussed.  -Return to the emergency department anytime if you begin to experience any new or worsening symptoms.

## 2022-03-22 NOTE — ED Provider Notes (Signed)
[facility] Provider Note    Event Date/Time   First MD Initiated Contact with Patient 03/22/22 1126     (approximate)   History   Chief Complaint N/V/D and Shortness of Breath   HPI [name] [name] is a 89 y.o. male, history of hypertension, CAD, CKD stage III, atrial fibrillation, hyperlipidemia, type 2 diabetes, prior CVA, HFrEF, presents to the emergency department for evaluation of shortness of breath and nausea/vomiting/diarrhea.  Reports 1 episode of vomiting today and 2 episodes of diarrhea.  Patient that he has been feeling unwell for the past week.  He was recently discharged from the hospital after being treated for influenza.  Denies chest pain, abdominal pain, flank pain, urinary symptoms, headache, restless lesions, paresthesias, or dizziness/lightheadedness.  History Limitations: No limitations.        Physical Exam  Triage Vital Signs: ED Triage Vitals  Enc Vitals Group     BP 03/22/22 1028 (!) 176/59     Pulse Rate 03/22/22 1028 (!) 56     Resp 03/22/22 1028 17     Temp 03/22/22 1028 97.9 F (36.6 C)     Temp Source 03/22/22 1028 Oral     SpO2 03/22/22 1028 95 %     Weight 03/22/22 1029 213 lb (96.6 kg)     Height 03/22/22 1029 '6\' 1"'$  (1.854 m)     Head Circumference --      Peak Flow --      Pain Score 03/22/22 1021 0     Pain Loc --      Pain Edu? --      Excl. in [place]? --     Most recent vital signs: Vitals:   03/22/22 1028 03/22/22 1603  BP: (!) 176/59 (!) 188/72  Pulse: (!) 56 (!) 58  Resp: 17 18  Temp: 97.9 F (36.6 C) 98.3 F (36.8 C)  SpO2: 95% 94%    General: Awake, NAD.  Skin: Warm, dry. No rashes or lesions.  Eyes: PERRL. Conjunctivae normal.  CV: Good peripheral perfusion.  Resp: Normal effort.  Lung sounds are clear bilaterally in the apices and bases. Abd: Soft, non-tender. No distention.  Neuro: At baseline. No gross neurological deficits.  Musculoskeletal: Normal ROM of all extremities.  Physical  Exam    ED Results / Procedures / Treatments  Labs (all labs ordered are listed, but only abnormal results are displayed) Labs Reviewed  BASIC METABOLIC PANEL - Abnormal; Notable for the following components:      Result Value   Glucose, Bld 208 (*)    Creatinine, Ser 1.46 (*)    Calcium 8.0 (*)    GFR, Estimated 47 (*)    All other components within normal limits  CBC - Abnormal; Notable for the following components:   RBC 3.40 (*)    Hemoglobin 10.7 (*)    HCT 32.6 (*)    Platelets 112 (*)    All other components within normal limits  URINALYSIS, ROUTINE W REFLEX MICROSCOPIC - Abnormal; Notable for the following components:   Color, Urine YELLOW (*)    APPearance CLEAR (*)    Glucose, UA 50 (*)    Ketones, ur 5 (*)    Protein, ur 30 (*)    Bacteria, UA RARE (*)    All other components within normal limits  HEPATIC FUNCTION PANEL - Abnormal; Notable for the following components:   Total Protein 6.4 (*)    Albumin 3.3 (*)    Indirect Bilirubin  1.0 (*)    All other components within normal limits  TROPONIN I (HIGH SENSITIVITY)  TROPONIN I (HIGH SENSITIVITY)     EKG Sinus rhythm, rate of 57, slightly prolonged QT, right bundle branch block present, no significant ST segment changes.    RADIOLOGY  ED Provider Interpretation: I personally reviewed and interpreted the chest x-ray, no evidence of acute cardiopulmonary abnormalities.  DG Chest 2 View  Result Date: 03/22/2022 CLINICAL DATA:  CP and SOB. EXAM: CHEST - 2 VIEW COMPARISON:  None Available. FINDINGS: Previous median sternotomy and CABG procedure. Stable cardiomediastinal contours. Aortic atherosclerosis. No pleural effusion or edema. No airspace opacities. Spondylosis noted within the thoracic spine. IMPRESSION: No active cardiopulmonary abnormalities. Electronically Signed   By: Kerby Moors M.D.   On: 03/22/2022 11:14    PROCEDURES:  Critical Care performed: N/A.  Procedures    MEDICATIONS ORDERED  IN ED: Medications  sodium chloride 0.9 % bolus 500 mL (0 mLs Intravenous Stopped 03/22/22 1614)  ondansetron (ZOFRAN) injection 4 mg (4 mg Intravenous Given 03/22/22 1155)     IMPRESSION / MDM / ASSESSMENT AND PLAN / ED COURSE  I reviewed the triage vital signs and the nursing notes.                              Differential diagnosis includes, but is not limited to, gastroenteritis, community-acquired pneumonia, COVID-19, influenza,  ED Course Patient appears well, hypertensive here today at 176/59.  He states that he has been taken off a few of his blood pressure medications recently due to his kidney function.  Otherwise afebrile.  Nonhypoxic.  CBC shows no leukocytosis.  Anemia present with hemoglobin of 10.7, consistent with baseline.  BMP shows have a glucose at 208 and elevated creatinine at 1.46.  Will initiate IV fluids.  Otherwise no significant electrolyte abnormalities.  Initial troponin 12, consistent with baseline.  Assessment/Plan Patient presents with mild shortness of breath and nausea/vomiting/diarrhea.  He appears well clinically.  Lab workup is reassuring with the exception of elevated creatinine, though this appears consistent with his baseline.  No signs of ischemia on EKG or troponin.  Suspect likely gastroenteritis.  Will provide him with prescription for ondansetron and loperamide.  Chest x-ray does not show any evidence of pneumonia, however given the patient's recent viral infection and his mild shortness of breath, it is possible that he may have a early pneumonia developing.  Will provide him with a course of antibiotics to be taken after 48 to 72 hours if his symptoms do not improve.  Recommend they follow-up with his primary care provider within the next week.  He was amenable to this.  Will discharge.  Considered admission for this patient, but given his stable presentation and unremarkable workup, is unlikely benefit from admission.  Provided the patient  with anticipatory guidance, return precautions, and educational material. Encouraged the patient to return to the emergency department at any time if they begin to experience any new or worsening symptoms. Patient expressed understanding and agreed with the plan.   Patient's presentation is most consistent with acute complicated illness / injury requiring diagnostic workup.       FINAL CLINICAL IMPRESSION(S) / ED DIAGNOSES   Final diagnoses:  Nausea vomiting and diarrhea     Rx / DC Orders   ED Discharge Orders          Ordered    ondansetron (ZOFRAN-ODT) 4 MG disintegrating tablet  Every 8 hours PRN        03/22/22 1542    loperamide (IMODIUM A-D) 2 MG tablet  3 times daily PRN        03/22/22 1542    guaiFENesin-codeine 100-10 MG/5ML syrup  3 times daily PRN        03/22/22 1542    amoxicillin-clavulanate (AUGMENTIN) 875-125 MG tablet  2 times daily        03/22/22 1542    doxycycline (ADOXA) 100 MG tablet  2 times daily        03/22/22 1542             Note:  This document was prepared using Dragon voice recognition software and may include unintentional dictation errors.   Teodoro Spray, [place] 03/22/22 1724    Nance Pear, MD 03/26/22 [phone]

## 2022-03-22 NOTE — ED Triage Notes (Signed)
Pt to ED via [facility] from home. Pt reports N/V/D and SOB that started at 3am. Pt d/c on Thursday and dx with COVID and Flu A. 18g RH. Pt given 250cc NS and '4mg'$  zofran. Pt with hx afib and on eliquis.   EMS VS:  99.1 oral 98% RA 186/74 HR 61  CBG 240

## 2022-03-23 ENCOUNTER — Telehealth: Payer: Self-pay | Admitting: Family Medicine

## 2022-03-23 ENCOUNTER — Encounter: Payer: Self-pay | Admitting: Family Medicine

## 2022-03-23 ENCOUNTER — Ambulatory Visit (INDEPENDENT_AMBULATORY_CARE_PROVIDER_SITE_OTHER): Payer: Medicare Other | Admitting: Family Medicine

## 2022-03-23 VITALS — BP 120/60 | HR 66 | Temp 98.4°F | Ht 73.0 in | Wt 221.4 lb

## 2022-03-23 DIAGNOSIS — B3742 Candidal balanitis: Secondary | ICD-10-CM

## 2022-03-23 DIAGNOSIS — K529 Noninfective gastroenteritis and colitis, unspecified: Secondary | ICD-10-CM | POA: Diagnosis not present

## 2022-03-23 DIAGNOSIS — N183 Chronic kidney disease, stage 3 unspecified: Secondary | ICD-10-CM | POA: Diagnosis not present

## 2022-03-23 DIAGNOSIS — I5033 Acute on chronic diastolic (congestive) heart failure: Secondary | ICD-10-CM

## 2022-03-23 DIAGNOSIS — J101 Influenza due to other identified influenza virus with other respiratory manifestations: Secondary | ICD-10-CM | POA: Diagnosis not present

## 2022-03-23 DIAGNOSIS — R21 Rash and other nonspecific skin eruption: Secondary | ICD-10-CM | POA: Insufficient documentation

## 2022-03-23 DIAGNOSIS — E1122 Type 2 diabetes mellitus with diabetic chronic kidney disease: Secondary | ICD-10-CM | POA: Diagnosis not present

## 2022-03-23 DIAGNOSIS — I5023 Acute on chronic systolic (congestive) heart failure: Secondary | ICD-10-CM | POA: Diagnosis not present

## 2022-03-23 NOTE — Telephone Encounter (Signed)
The patient called today stating his blood sugar was 274. He questioned if he should take more insuline. I spoke with his provider, he asked if the patient had taken his Toujeo . I asked the patient he stated he had taken 11 units of the Toujeo today. I informed the provider he instructed me to tell the patient not to take any more today. However, on tomorrow to take 12 units of the Toujeo and check his blood sugars to make sure they do not get below 70 also to call the office with his readings. The patient voiced understanding.

## 2022-03-23 NOTE — Assessment & Plan Note (Signed)
Patient with AKI in the hospital.  Kidney function did improve on recheck yesterday.  We will recheck his kidney function next week after resuming torsemide.

## 2022-03-23 NOTE — Assessment & Plan Note (Addendum)
Suspect the patient is retaining some fluid given his swelling and his dyspnea.  Will have him restart torsemide 20 mg daily and recheck labs next week.  Discussed limiting fluid intake to 40 to 60 ounces of liquid daily.  The patient will contact his cardiologist to set up follow-up in the near future.

## 2022-03-23 NOTE — Assessment & Plan Note (Signed)
Patient reports this is improving.  He will remain off of [place].

## 2022-03-23 NOTE — Patient Instructions (Addendum)
Nice to see you.  Please resume the torsemide 20 mg daily. We will have you back next week for lab recheck.  Please take your Zofran before trying to eat to see if that helps with your appetite and nausea.   Please continue to monitor your sugars.  Please start checking her sugar 2 hours after you eat a meal.  Please continue to check it fasting as well. Please call your cardiologist to set up sooner evaluation with them for your shortness of breath.

## 2022-03-23 NOTE — Progress Notes (Signed)
[name], MD Phone: [phone]  [name] [name] is a 89 y.o. male who presents today for follow-up.  Influenza/respiratory failure: Patient was recently hospitalized from 03/14/2022 - 03/17/2022.  He presented to the emergency department with several days of cough and myalgias as well as nausea and vomiting.  He was found to have influenza A.  He was treated with Tamiflu, IV fluids, and Tylenol.  He was placed on 2 L nasal cannula though was able to wean to room air at discharge.  He did have some AKI though this was treated with gentle fluids.  They held a number of his medications due to his AKI including Wilder Glade (also held due to balanitis), Entresto, torsemide, and valsartan.  Patient notes his cough has progressively improved.  Continues to be more of an issue at night.  He has had no fevers.  He is coughing up clear sputum now instead of green sputum.  Patient does note his appetite has not been good since he came down with the flu.  When he tries to eat he gets nauseous.  His wife does report a hypoglycemic episode while he was in the hospital and that he may have had a seizure related to this.  She notes that sugar got down into the 20s.  Patient wonders if he would benefit from oxygen at home given his dyspnea and fatigue.  Patient does note fatigue with ambulation.  Does note he has gained some weight over the last day or so.  He has had some swelling in his legs.  Diabetes: Patient is taking Toujeo 11 units daily.  He did not get any insulin yesterday.  Typically his fasting sugars are 100-130.  He does not check any postprandial sugars.  Gastroenteritis: Patient went to the emergency room yesterday for nausea, vomiting, and diarrhea.  He noted no abdominal pain or blood in the stool.  They treated him with Imodium and Zofran.  He has not had any vomiting or diarrhea today.  Notably his kidney function was better yesterday than it had been in the hospital previously.  Leg rash: Patient  notes this has been present recently.  It is on the inner aspect of his right lower leg.  It does not itch or hurt.  It seems to be getting better.  Social History   Tobacco Use  Smoking Status Never  Smokeless Tobacco Never    Current Outpatient Medications on File Prior to Visit  Medication Sig Dispense Refill   amiodarone (PACERONE) 200 MG tablet Take 200 mg by mouth daily.     amoxicillin-clavulanate (AUGMENTIN) 875-125 MG tablet Take 1 tablet by mouth 2 (two) times daily for 5 days. 10 tablet 0   apixaban (ELIQUIS) 2.5 MG TABS tablet Take 1 tablet (2.5 mg total) by mouth 2 (two) times daily. 120 tablet 0   BD PEN NEEDLE NANO U/F 32G X 4 MM MISC USE AS DIRECTED 100 each 0   benzonatate (TESSALON PERLES) 100 MG capsule Take 1 capsule (100 mg total) by mouth every 6 (six) hours as needed for cough. 30 capsule 0   benzonatate (TESSALON) 200 MG capsule Take 1 capsule (200 mg total) by mouth 2 (two) times daily as needed for cough. 20 capsule 0   carvedilol (COREG) 12.5 MG tablet TAKE ONE (1) TABLET BY MOUTH TWO TIMES PER DAY (Patient taking differently: Take 12.5 mg by mouth 2 (two) times daily with a meal.) 60 tablet 0   Cholecalciferol (VITAMIN D3) 50 MCG (2000 UT)  TABS Take 2,000 Units by mouth in the morning.     clotrimazole (LOTRIMIN) 1 % cream Apply topically 2 (two) times daily for 14 days. 30 g 0   doxazosin (CARDURA) 2 MG tablet Take 1 tablet (2 mg total) by mouth 2 (two) times daily. Please keep upcoming appt for future refills 180 tablet 0   doxycycline (ADOXA) 100 MG tablet Take 1 tablet (100 mg total) by mouth 2 (two) times daily for 5 days. 10 tablet 0   ezetimibe (ZETIA) 10 MG tablet TAKE 1 TABLET BY MOUTH DAILY 90 tablet 0   finasteride (PROSCAR) 5 MG tablet Take 1 tablet (5 mg total) by mouth daily. (Patient taking differently: Take 5 mg by mouth every evening.) 90 tablet 1   guaiFENesin-codeine 100-10 MG/5ML syrup Take 10 mLs by mouth 3 (three) times daily as needed for  cough. 120 mL 0   hydrALAZINE (APRESOLINE) 25 MG tablet Take 1 tablet (25 mg total) by mouth 3 (three) times daily. 270 tablet 3   isosorbide mononitrate (IMDUR) 30 MG 24 hr tablet Take 1 tablet (30 mg total) by mouth daily. Please call [phone] for a sooner appointment and for further refills. 30 tablet 0   loperamide (IMODIUM A-D) 2 MG tablet Take 1 tablet (2 mg total) by mouth 3 (three) times daily as needed for diarrhea or loose stools. 30 tablet 0   Multiple Vitamins-Minerals (PRESERVISION AREDS 2 PO) Take 1 tablet by mouth in the morning and at bedtime.     omeprazole (PRILOSEC) 20 MG capsule Take 1 capsule (20 mg total) by mouth 2 (two) times daily. 60 capsule 1   ondansetron (ZOFRAN-ODT) 4 MG disintegrating tablet Take 1 tablet (4 mg total) by mouth every 8 (eight) hours as needed for nausea or vomiting. 20 tablet 0   potassium chloride (KLOR-CON) 10 MEQ tablet Take 1 tablet (10 mEq total) by mouth daily as needed. Take with torsemide 90 tablet 3   TOUJEO SOLOSTAR 300 UNIT/ML Solostar Pen Inject 10 Units into the skin daily. 22.5 mL 6   No current facility-administered medications on file prior to visit.     ROS see history of present illness  Objective  Physical Exam Vitals:   03/23/22 1110  BP: 120/60  Pulse: 66  Temp: 98.4 F (36.9 C)  SpO2: 97%    BP Readings from Last 3 Encounters:  03/23/22 120/60  03/22/22 (!) 188/72  03/17/22 (!) 154/60   Wt Readings from Last 3 Encounters:  03/23/22 221 lb 6.4 oz (100.4 kg)  03/22/22 213 lb (96.6 kg)  03/17/22 228 lb 9.9 oz (103.7 kg)    Physical Exam Constitutional:      General: He is not in acute distress.    Appearance: He is not diaphoretic.  Cardiovascular:     Rate and Rhythm: Normal rate and regular rhythm.     Heart sounds: Normal heart sounds.  Pulmonary:     Effort: Pulmonary effort is normal.     Breath sounds: Normal breath sounds.  Musculoskeletal:     Comments: 1+ pitting edema to the midshin  bilaterally  Skin:    General: Skin is warm and dry.       Neurological:     Mental Status: He is alert.    6-minute walk test the patient dropped to 94%, he was not able to complete all 6 minutes due to dyspnea  Assessment/Plan: Please see individual problem list.  Acute on chronic systolic congestive heart failure ([place]) -  Basic metabolic panel; Future  Acute on chronic heart failure with preserved ejection fraction (HFpEF) ([place]) Assessment & Plan: Suspect the patient is retaining some fluid given his swelling and his dyspnea.  Will have him restart torsemide 20 mg daily and recheck labs next week.  Discussed limiting fluid intake to 40 to 60 ounces of liquid daily.  The patient will contact his cardiologist to set up follow-up in the near future.   Influenza A Assessment & Plan: Patient is improving from his prior influenza symptoms.  Cough has continued to improve some.  Discussed use of Tessalon Perles for his cough.  Discussed it may take quite some time for the cough to resolve.  Advised that some of his dyspnea could be related to deconditioning from having been in the hospital several times over the last month and a half.  I did discuss that if his fatigue and dyspnea are not improving with time or with treatment of his cardiac issue then he needs to have lung function testing or see a pulmonologist.  Discussed the patient does not meet criteria for home oxygen at this time.   Type 2 diabetes mellitus with stage 3 chronic kidney disease, without long-term current use of insulin, unspecified whether stage 3a or 3b CKD ([place]) Assessment & Plan: Chronic issue.  Patient will remain on Toujeo 11 units daily.  They will continue to monitor sugar fasting and they will start checking his sugars 2 hours postprandial 1 meal a day.  He has been referred to endocrinology and if he does not hear from them in the next week or 2 he will let us know.   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  Kidney function did improve on recheck yesterday.  We will recheck his kidney function next week after resuming torsemide.   Gastroenteritis Assessment & Plan: Vomiting and diarrhea likely related to gastroenteritis.  Discussed that should start to improve in the next day or 2.  Advised on hydration.  Discussed taking nausea medication to see if that helps with oral intake.  Appetite may be reduced related to being sick with the flu and gastroenteritis back to back.  Discussed appetite should recover over the next week or so.  Encouraged high-calorie foods while limiting salt intake and try not to eat too much sugar.   Rash Assessment & Plan: Undetermined cause.  They reported appears to be improving some.  Patient will monitor and if it starts to bother him they can let us know.   Candidiasis of penis Assessment & Plan: Patient reports this is improving.  He will remain off of [place].      Return in about 6 weeks (around 05/04/2022) for f/u, labs on monday.  I have spent 47 minutes in the care of this patient regarding history taking, documentation, completion of exam, review of discharge summary and ED visit, CT chest abdomen and pelvis report, CT head report, most recent chest x-ray result, placing orders.   [name], MD [place]

## 2022-03-23 NOTE — Assessment & Plan Note (Signed)
Vomiting and diarrhea likely related to gastroenteritis.  Discussed that should start to improve in the next day or 2.  Advised on hydration.  Discussed taking nausea medication to see if that helps with oral intake.  Appetite may be reduced related to being sick with the flu and gastroenteritis back to back.  Discussed appetite should recover over the next week or so.  Encouraged high-calorie foods while limiting salt intake and try not to eat too much sugar.

## 2022-03-23 NOTE — Assessment & Plan Note (Signed)
Chronic issue.  Patient will remain on Toujeo 11 units daily.  They will continue to monitor sugar fasting and they will start checking his sugars 2 hours postprandial 1 meal a day.  He has been referred to endocrinology and if he does not hear from them in the next week or 2 he will let us know.

## 2022-03-23 NOTE — Assessment & Plan Note (Signed)
Undetermined cause.  They reported appears to be improving some.  Patient will monitor and if it starts to bother him they can let us know.

## 2022-03-23 NOTE — Assessment & Plan Note (Addendum)
Patient is improving from his prior influenza symptoms.  Cough has continued to improve some.  Discussed use of Tessalon Perles for his cough.  Discussed it may take quite some time for the cough to resolve.  Advised that some of his dyspnea could be related to deconditioning from having been in the hospital several times over the last month and a half.  I did discuss that if his fatigue and dyspnea are not improving with time or with treatment of his cardiac issue then he needs to have lung function testing or see a pulmonologist.  Discussed the patient does not meet criteria for home oxygen at this time.

## 2022-03-24 ENCOUNTER — Telehealth: Payer: Self-pay | Admitting: Family Medicine

## 2022-03-24 ENCOUNTER — Telehealth: Payer: Self-pay

## 2022-03-24 NOTE — Telephone Encounter (Signed)
Pt called stating he did not meet the criteria for the [facility] referral to endocrinology. Pt would like to be called

## 2022-03-24 NOTE — Telephone Encounter (Signed)
Left voicemail for patient letting him know that we have his medication at our office and we would like for him to come pick it up tomorrow.

## 2022-03-25 ENCOUNTER — Telehealth: Payer: Self-pay

## 2022-03-25 DIAGNOSIS — N183 Chronic kidney disease, stage 3 unspecified: Secondary | ICD-10-CM

## 2022-03-25 NOTE — Telephone Encounter (Signed)
Patient is going to take 12 units of Toujeo daily and check his blood sugars.

## 2022-03-25 NOTE — Telephone Encounter (Signed)
Patient states he would like to go back to [facility] if you could help him that would be great.

## 2022-03-25 NOTE — Telephone Encounter (Signed)
They would have to call them to set the appointment up given that he had been seen there previously. They already declined my referral, so the patient or his wife should call to see about an appointment.

## 2022-03-25 NOTE — Telephone Encounter (Signed)
Patient states he is calling Fulton Mole, CMA, with the number for an endocrinologist in [place], Dr. Mee Hives, who he would like to be referred to.  Patient states Dr. Sherren Mocha phone number is [phone] and  his fax number is [phone].

## 2022-03-25 NOTE — Telephone Encounter (Signed)
Noted. He should continue with the dose of toujeo that I advised earlier this week.

## 2022-03-25 NOTE — Telephone Encounter (Signed)
Endocrinology referral placed for Mee Hives at [facility] per Patient request

## 2022-03-25 NOTE — Telephone Encounter (Signed)
Patients wife came in yesterday and picked up the medication.  Roslynn Holte,cma

## 2022-03-25 NOTE — Telephone Encounter (Signed)
I called the patient to see how he was doing with his BS and he stated he did what the provider told him with the Toujeo and  this morning at 9 am his BS was 160 and his appetite is back, so this morning after breakfast his sugar was 176. He stated he finally slept in the bed and di not have a coughing spell he thinks he has turned a corner with the flu sickness.  He feels better.  Shatoya Roets,cma

## 2022-03-25 NOTE — Telephone Encounter (Signed)
Noted. If he has been going to endocrinology there previously they could try to call and set up an appointment.

## 2022-03-25 NOTE — Telephone Encounter (Signed)
I spoke with the wife about this when she picked up the medication and they are willing to try somewhere else she stated they have been going to [facility] for years.  Taneka Espiritu,cma

## 2022-03-28 ENCOUNTER — Other Ambulatory Visit (INDEPENDENT_AMBULATORY_CARE_PROVIDER_SITE_OTHER): Payer: Medicare Other

## 2022-03-28 ENCOUNTER — Ambulatory Visit: Payer: Medicare Other | Admitting: Neurology

## 2022-03-28 ENCOUNTER — Encounter: Payer: Self-pay | Admitting: Neurology

## 2022-03-28 VITALS — BP 172/69 | HR 55 | Ht 73.0 in | Wt 221.0 lb

## 2022-03-28 DIAGNOSIS — G8194 Hemiplegia, unspecified affecting left nondominant side: Secondary | ICD-10-CM

## 2022-03-28 DIAGNOSIS — I639 Cerebral infarction, unspecified: Secondary | ICD-10-CM

## 2022-03-28 DIAGNOSIS — I482 Chronic atrial fibrillation, unspecified: Secondary | ICD-10-CM | POA: Diagnosis not present

## 2022-03-28 DIAGNOSIS — I5023 Acute on chronic systolic (congestive) heart failure: Secondary | ICD-10-CM

## 2022-03-28 NOTE — Progress Notes (Signed)
[facility] [address] [place]. [place] [address] [phone]       OFFICE CONSULT NOTE  [name] [name] Date of Birth:  05/01/1933 Medical Record Number:  [id number]   Referring MD:  Su Monks  Reason for Referral:  Stroke  HPI: [name] [name] is a 89 year old Caucasian male with past medical history for chronic atrial fibrillation, coronary artery disease s/p CABG, heart failure with preserved ejection fraction, type 2 diabetes, chronic kidney disease stage IIIb, hypertension.  He presented on 01/26/2022 with sudden onset of slurred speech and left leg weakness while at home.  NIH stroke scale was 6.  He had taken his dose of Eliquis that day hence was not a candidate for thrombolysis.  CT angiogram showed no LVO but showed severe intracranial atherosclerotic changes involving bilateral P2, V 4 and right M2 middle cerebral arteries.  There was also moderate to severe stenosis left vertebral artery origin.  2D echo showed ejection fraction of 60 to 65%.  LDL cholesterol was optimal at 35 mg percent.  Hemoglobin A1c on 03/16/2022 was borderline at 6.7.  Patient was asked to continue Eliquis (had a couple of visits to ER due to dehydration and diarrhea and worsening renal failure angelic his dose was reduced to 2.5 twice daily.  Lately he is improving.  Patient still has some subjective feeling of left-sided weakness particularly with walking he feels is not the same.  He has not had any falls or injuries.  He had reduction in the dose of his insulin the last blood pressure medicine following his release and hospitalization.  His blood pressure is quite elevated at 172/69.  He denies any prior history of strokes or TIAs or seizures, migraines or any other neurological issues.  He does have chronic back pain with a cane to walk.  Neurological he is done well with almost complete improvement in his left-sided deficits except mild residual gait difficulties .  ROS:   14  system review of systems is positive for back pain, difficulty walking,, bruising and all other systems negative  PMH:  Past Medical History:  Diagnosis Date   (HFpEF) heart failure with preserved ejection fraction ([place])    a. 08/2692 Echo: nl systolic fxn, mild LVH, diastolic relaxation abnormality, mildly enlarged LA, mild Ao insufficiency; 01/2018 Echo: EF 55-60%, no rwma, GR2 DD, triv AI, mildly dil LA, mild inc PASP.   AMS (altered mental status) 02/03/2021   BPH (benign prostatic hyperplasia)    Cancer ([place])    skin   Cataract    Choledocholithiasis    a. 05/2020 ERCP w/ CBD stone removal and biliary sphincterotomy.   Coronary artery disease    a. 01/2005 s/p CABG x 4: LIMA-LAD, VG-Diag, VG-OM3, VG-dRCA; b. 06/2013 Cath: patent grafts; c. 09/2015 Cath: VG->Diag 100, other grafts patent. LAD 100, RCA sev dzs->Med rx; d. 01/2018 Cath: LAD 100ost/p, LCX 50, RCA 100p/d, VG->OM3 100, VG->Diag 100, LIMA->LAD ok, VG->RPAV 55m->Med rx.   COVID-19    07/2020 had paxlovid    Dyspnea    with exertion   Dysrhythmia    ED (erectile dysfunction)    Elevated LFTs    a. 05/2020 following CBD stone. 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   GERD (gastroesophageal reflux disease)    Hyperlipidemia    Hypertension    IDDM (insulin dependent diabetes mellitus) 2000   Osteoarthritis    PAH ([place] artery hypertension) [place]    a. 01/2018  RHC: PA 51/22 (32).   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  Tobacco Use   Smoking status: Never   Smokeless tobacco: Never  Vaping Use    Vaping Use: Never used  Substance and Sexual Activity   Alcohol use: Yes    Comment: occ cocktail   Drug use: No   Sexual activity: Not Currently  Other Topics Concern   Not on file  Social History Narrative   Goes by [place]   Has living will    Has designated [employer] as health care POA   Would accept resuscitation attempts   Would not want tube feeds if cognitively unaware   Social Determinants of Health   Financial Resource Strain: Low Risk  (07/07/2021)   Overall Financial Resource Strain (CARDIA)    Difficulty of Paying Living Expenses: Not hard at all  Food Insecurity: No Food Insecurity (03/18/2022)   Hunger Vital Sign    Worried About Running Out of Food in the Last Year: Never true    [place] in the Last Year: Never true  Transportation Needs: No Transportation Needs (03/14/2022)   PRAPARE - Hydrologist (Medical): No    Lack of Transportation (Non-Medical): No  Physical Activity: Sufficiently Active (07/03/2020)   Exercise Vital Sign    Days of Exercise per Week: 4 days    Minutes of Exercise per Session: 40 min  Stress: No Stress Concern Present (07/07/2021)   [place]    Feeling of Stress : Not at all  Social Connections: Unknown (07/07/2021)   Social Connection and Isolation Panel [NHANES]    Frequency of Communication with Friends and Family: Not on file    Frequency of Social Gatherings with Friends and Family: Not on file    Attends Religious Services: Not on file    Active Member of Clubs or Organizations: Not on file    Attends Archivist Meetings: Not on file    Marital Status: Married  Intimate Partner Violence: Not At Risk (03/14/2022)   Humiliation, Afraid, Rape, and Kick questionnaire    Fear of Current or Ex-Partner: No    Emotionally Abused: No    Physically Abused: No    Sexually Abused: No    Medications:   Current Outpatient  Medications on File Prior to Visit  Medication Sig Dispense Refill   amiodarone (PACERONE) 200 MG tablet Take 200 mg by mouth daily.     apixaban (ELIQUIS) 2.5 MG TABS tablet Take 1 tablet (2.5 mg total) by mouth 2 (two) times daily. 120 tablet 0   BD PEN NEEDLE NANO U/F 32G X 4 MM MISC USE AS DIRECTED 100 each 0   carvedilol (COREG) 12.5 MG tablet TAKE ONE (1) TABLET BY MOUTH TWO TIMES PER DAY (Patient taking differently: Take 12.5 mg by mouth 2 (two) times daily with a meal.) 60 tablet 0   Cholecalciferol (VITAMIN D3) 50 MCG (2000 UT) TABS Take 2,000 Units by mouth in the morning.  clotrimazole (LOTRIMIN) 1 % cream Apply topically 2 (two) times daily for 14 days. 30 g 0   doxazosin (CARDURA) 2 MG tablet Take 1 tablet (2 mg total) by mouth 2 (two) times daily. Please keep upcoming appt for future refills 180 tablet 0   ezetimibe (ZETIA) 10 MG tablet TAKE 1 TABLET BY MOUTH DAILY 90 tablet 0   finasteride (PROSCAR) 5 MG tablet Take 1 tablet (5 mg total) by mouth daily. (Patient taking differently: Take 5 mg by mouth every evening.) 90 tablet 1   guaiFENesin-codeine 100-10 MG/5ML syrup Take 10 mLs by mouth 3 (three) times daily as needed for cough. 120 mL 0   hydrALAZINE (APRESOLINE) 25 MG tablet Take 1 tablet (25 mg total) by mouth 3 (three) times daily. 270 tablet 3   isosorbide mononitrate (IMDUR) 30 MG 24 hr tablet Take 1 tablet (30 mg total) by mouth daily. Please call [phone] for a sooner appointment and for further refills. 30 tablet 0   loperamide (IMODIUM A-D) 2 MG tablet Take 1 tablet (2 mg total) by mouth 3 (three) times daily as needed for diarrhea or loose stools. 30 tablet 0   Multiple Vitamins-Minerals (PRESERVISION AREDS 2 PO) Take 1 tablet by mouth in the morning and at bedtime.     omeprazole (PRILOSEC) 20 MG capsule Take 1 capsule (20 mg total) by mouth 2 (two) times daily. 60 capsule 1   ondansetron (ZOFRAN-ODT) 4 MG disintegrating tablet Take 1 tablet (4 mg total) by mouth  every 8 (eight) hours as needed for nausea or vomiting. 20 tablet 0   potassium chloride (KLOR-CON) 10 MEQ tablet Take 1 tablet (10 mEq total) by mouth daily as needed. Take with torsemide 90 tablet 3   TOUJEO SOLOSTAR 300 UNIT/ML Solostar Pen Inject 10 Units into the skin daily. 22.5 mL 6   No current facility-administered medications on file prior to visit.    Allergies:   Allergies  Allergen Reactions   Cortisone Other (See Comments)    Leg Cramps   Metformin Diarrhea    Physical Exam General: Obese pleasant elderly Caucasian male, seated, in no evident distress Head: head normocephalic and atraumatic.   Neck: supple with no carotid or supraclavicular bruits Cardiovascular: regular rate and rhythm, no murmurs Musculoskeletal: no deformity Skin:  no rash/petichiae Vascular:  Normal pulses all extremities  Neurologic Exam Mental Status: Awake and fully alert. Oriented to place and time. Recent and remote memory intact. Attention span, concentration and fund of knowledge appropriate. Mood and affect appropriate.  Cranial Nerves: Fundoscopic exam reveals sharp disc margins. Pupils equal, briskly reactive to light. Extraocular movements full without nystagmus. Visual fields full to confrontation. Hearing intact. Facial sensation intact. Face, tongue, palate moves normally and symmetrically.  Motor: Normal bulk and tone. Normal strength in all tested extremity muscles except left foot drop.. Sensory.: intact to touch , pinprick , position and vibratory sensation.  Coordination: Rapid alternating movements normal in all extremities. Finger-to-nose and heel-to-shin performed accurately bilaterally. Gait and Station: Arises from chair without difficulty. Stance is slightly stooped and favors his back.. Gait cautious and slow . reflexes: 1+ and symmetric. Toes downgoing.   NIHSS  0 Modified Rankin  1   ASSESSMENT: 89 year old Caucasian male with right subcortical infarction and November  2023 due to vessel disease.  Vascular risk factors of atrial fibrillation, diabetes, hypertension, hyperlipidemia , intra and extracranial atherosclerosis and obesity.  He is doing very well with no residual deficits.     PLAN:I had a  long d/w patient and his wife about his recent stroke, risk for recurrent stroke/TIAs, personally independently reviewed imaging studies and stroke evaluation results and answered questions.Continue Eliquis (apixaban)  2.5 mg twice daily  for secondary stroke prevention and maintain strict control of hypertension with blood pressure goal below 130/90, diabetes with hemoglobin A1c goal below 6.5% and lipids with LDL cholesterol goal below 70 mg/dL. I also advised the patient to eat a healthy diet with plenty of whole grains, cereals, fruits and vegetables, exercise regularly and maintain ideal body weight.  Consider possible participation in the [place] AF trial (eliquis versus Milvexian-Factor 11 inhibitor) if interested.  He will be given information to review and decide.  Followup in the future with me in 3 months or call earlier if necessary.  Greater than 50% time during this 45-minute consultation visit was spent on counseling and coordination of care about stroke and atrial fibrillation and answering questions.  Antony Contras, MD Note: This document was prepared with digital dictation and possible smart phrase technology. Any transcriptional errors that result from this process are unintentional.

## 2022-03-28 NOTE — Patient Instructions (Signed)
I had a long d/w patient about his recent stroke, risk for recurrent stroke/TIAs, personally independently reviewed imaging studies and stroke evaluation results and answered questions.Continue Eliquis (apixaban)  2.5 mg twice daily  for secondary stroke prevention and maintain strict control of hypertension with blood pressure goal below 130/90, diabetes with hemoglobin A1c goal below 6.5% and lipids with LDL cholesterol goal below 70 mg/dL. I also advised the patient to eat a healthy diet with plenty of whole grains, cereals, fruits and vegetables, exercise regularly and maintain ideal body weight.  Consider possible participation in the [place] AF trial (eliquis versus Milvexian-Factor 11 inhibitor) if interested.  He will be given information to review and decide.  Followup in the future with me in 3 months or call earlier if necessary.  Stroke Prevention Some medical conditions and behaviors can lead to a higher chance of having a stroke. You can help prevent a stroke by eating healthy, exercising, not smoking, and managing any medical conditions you have. Stroke is a leading cause of functional impairment. Primary prevention is particularly important because a majority of strokes are first-time events. Stroke changes the lives of not only those who experience a stroke but also their family and other caregivers. How can this condition affect me? A stroke is a medical emergency and should be treated right away. A stroke can lead to brain damage and can sometimes be life-threatening. If a person gets medical treatment right away, there is a better chance of surviving and recovering from a stroke. What can increase my risk? The following medical conditions may increase your risk of a stroke: Cardiovascular disease. High blood pressure (hypertension). Diabetes. High cholesterol. Sickle cell disease. Blood clotting disorders (hypercoagulable state). Obesity. Sleep disorders (obstructive sleep  apnea). Other risk factors include: Being older than age 59. Having a history of blood clots, stroke, or mini-stroke (transient ischemic attack, TIA). Genetic factors, such as race, ethnicity, or a family history of stroke. Smoking cigarettes or using other tobacco products. Taking birth control pills, especially if you also use tobacco. Heavy use of alcohol or drugs, especially cocaine and methamphetamine. Physical inactivity. What actions can I take to prevent this? Manage your health conditions High cholesterol levels. Eating a healthy diet is important for preventing high cholesterol. If cholesterol cannot be managed through diet alone, you may need to take medicines. Take any prescribed medicines to control your cholesterol as told by your health care provider. Hypertension. To reduce your risk of stroke, try to keep your blood pressure below 130/80. Eating a healthy diet and exercising regularly are important for controlling blood pressure. If these steps are not enough to manage your blood pressure, you may need to take medicines. Take any prescribed medicines to control hypertension as told by your health care provider. Ask your health care provider if you should monitor your blood pressure at home. Have your blood pressure checked every year, even if your blood pressure is normal. Blood pressure increases with age and some medical conditions. Diabetes. Eating a healthy diet and exercising regularly are important parts of managing your blood sugar (glucose). If your blood sugar cannot be managed through diet and exercise, you may need to take medicines. Take any prescribed medicines to control your diabetes as told by your health care provider. Get evaluated for obstructive sleep apnea. Talk to your health care provider about getting a sleep evaluation if you snore a lot or have excessive sleepiness. Make sure that any other medical conditions you have, such as atrial  fibrillation or  atherosclerosis, are managed. Nutrition Follow instructions from your health care provider about what to eat or drink to help manage your health condition. These instructions may include: Reducing your daily calorie intake. Limiting how much salt (sodium) you use to 1,500 milligrams (mg) each day. Using only healthy fats for cooking, such as olive oil, canola oil, or sunflower oil. Eating healthy foods. You can do this by: Choosing foods that are high in fiber, such as whole grains, and fresh fruits and vegetables. Eating at least 5 servings of fruits and vegetables a day. Try to fill one-half of your plate with fruits and vegetables at each meal. Choosing lean protein foods, such as lean cuts of meat, poultry without skin, fish, tofu, beans, and nuts. Eating low-fat dairy products. Avoiding foods that are high in sodium. This can help lower blood pressure. Avoiding foods that have saturated fat, trans fat, and cholesterol. This can help prevent high cholesterol. Avoiding processed and prepared foods. Counting your daily carbohydrate intake.  Lifestyle If you drink alcohol: Limit how much you have to: 0-1 drink a day for women who are not pregnant. 0-2 drinks a day for men. Know how much alcohol is in your drink. In the [place] one drink equals one 12 oz bottle of beer (311m), one 5 oz glass of wine (1489m, or one 1 oz glass of hard liquor (4447m Do not use any products that contain nicotine or tobacco. These products include cigarettes, chewing tobacco, and vaping devices, such as e-cigarettes. If you need help quitting, ask your health care provider. Avoid secondhand smoke. Do not use drugs. Activity  Try to stay at a healthy weight. Get at least 30 minutes of exercise on most days, such as: Fast walking. Biking. Swimming. Medicines Take over-the-counter and prescription medicines only as told by your health care provider. Aspirin or blood thinners (antiplatelets or  anticoagulants) may be recommended to reduce your risk of forming blood clots that can lead to stroke. Avoid taking birth control pills. Talk to your health care provider about the risks of taking birth control pills if: You are over 35 60ars old. You smoke. You get very bad headaches. You have had a blood clot. Where to find more information American Stroke Association: www.strokeassociation.org Get help right away if: You or a loved one has any symptoms of a stroke. "BE FAST" is an easy way to remember the main warning signs of a stroke: B - Balance. Signs are dizziness, sudden trouble walking, or loss of balance. E - Eyes. Signs are trouble seeing or a sudden change in vision. F - Face. Signs are sudden weakness or numbness of the face, or the face or eyelid drooping on one side. A - Arms. Signs are weakness or numbness in an arm. This happens suddenly and usually on one side of the body. S - Speech. Signs are sudden trouble speaking, slurred speech, or trouble understanding what people say. T - Time. Time to call emergency services. Write down what time symptoms started. You or a loved one has other signs of a stroke, such as: A sudden, severe headache with no known cause. Nausea or vomiting. Seizure. These symptoms may represent a serious problem that is an emergency. Do not wait to see if the symptoms will go away. Get medical help right away. Call your local emergency services (911 in the [place] Do not drive yourself to the hospital. Summary You can help to prevent a stroke by eating healthy, exercising, not smoking,  limiting alcohol intake, and managing any medical conditions you may have. Do not use any products that contain nicotine or tobacco. These include cigarettes, chewing tobacco, and vaping devices, such as e-cigarettes. If you need help quitting, ask your health care provider. Remember "BE FAST" for warning signs of a stroke. Get help right away if you or a loved one has any  of these signs. This information is not intended to replace advice given to you by your health care provider. Make sure you discuss any questions you have with your health care provider. Document Revised: 09/23/2019 Document Reviewed: 09/23/2019 Elsevier Patient Education  [place].

## 2022-03-29 ENCOUNTER — Other Ambulatory Visit: Payer: Medicare Other

## 2022-03-29 DIAGNOSIS — I4821 Permanent atrial fibrillation: Secondary | ICD-10-CM | POA: Diagnosis not present

## 2022-03-29 DIAGNOSIS — N1832 Chronic kidney disease, stage 3b: Secondary | ICD-10-CM | POA: Diagnosis not present

## 2022-03-29 DIAGNOSIS — B3749 Other urogenital candidiasis: Secondary | ICD-10-CM | POA: Diagnosis not present

## 2022-03-29 DIAGNOSIS — Z5982 Transportation insecurity: Secondary | ICD-10-CM | POA: Diagnosis not present

## 2022-03-29 DIAGNOSIS — Z8616 Personal history of COVID-19: Secondary | ICD-10-CM | POA: Diagnosis not present

## 2022-03-29 DIAGNOSIS — E785 Hyperlipidemia, unspecified: Secondary | ICD-10-CM | POA: Diagnosis not present

## 2022-03-29 DIAGNOSIS — E11649 Type 2 diabetes mellitus with hypoglycemia without coma: Secondary | ICD-10-CM | POA: Diagnosis not present

## 2022-03-29 DIAGNOSIS — Z7901 Long term (current) use of anticoagulants: Secondary | ICD-10-CM | POA: Diagnosis not present

## 2022-03-29 DIAGNOSIS — Z951 Presence of aortocoronary bypass graft: Secondary | ICD-10-CM | POA: Diagnosis not present

## 2022-03-29 DIAGNOSIS — I130 Hypertensive heart and chronic kidney disease with heart failure and stage 1 through stage 4 chronic kidney disease, or unspecified chronic kidney disease: Secondary | ICD-10-CM | POA: Diagnosis not present

## 2022-03-29 DIAGNOSIS — J9601 Acute respiratory failure with hypoxia: Secondary | ICD-10-CM | POA: Diagnosis not present

## 2022-03-29 DIAGNOSIS — Z794 Long term (current) use of insulin: Secondary | ICD-10-CM | POA: Diagnosis not present

## 2022-03-29 DIAGNOSIS — I2510 Atherosclerotic heart disease of native coronary artery without angina pectoris: Secondary | ICD-10-CM | POA: Diagnosis not present

## 2022-03-29 DIAGNOSIS — I5032 Chronic diastolic (congestive) heart failure: Secondary | ICD-10-CM | POA: Diagnosis not present

## 2022-03-29 DIAGNOSIS — K219 Gastro-esophageal reflux disease without esophagitis: Secondary | ICD-10-CM | POA: Diagnosis not present

## 2022-03-29 DIAGNOSIS — Z9181 History of falling: Secondary | ICD-10-CM | POA: Diagnosis not present

## 2022-03-29 DIAGNOSIS — N179 Acute kidney failure, unspecified: Secondary | ICD-10-CM | POA: Diagnosis not present

## 2022-03-29 DIAGNOSIS — E1122 Type 2 diabetes mellitus with diabetic chronic kidney disease: Secondary | ICD-10-CM | POA: Diagnosis not present

## 2022-03-29 DIAGNOSIS — Z8673 Personal history of transient ischemic attack (TIA), and cerebral infarction without residual deficits: Secondary | ICD-10-CM | POA: Diagnosis not present

## 2022-03-29 LAB — BASIC METABOLIC PANEL
BUN: 15 mg/dL (ref 6–23)
CO2: 27 mEq/L (ref 19–32)
Calcium: 8.3 mg/dL — ABNORMAL LOW (ref 8.4–10.5)
Chloride: 106 mEq/L (ref 96–112)
Creatinine, Ser: 1.53 mg/dL — ABNORMAL HIGH (ref 0.40–1.50)
GFR: 40.94 mL/min — ABNORMAL LOW (ref >60.00)
Glucose, Bld: 170 mg/dL — ABNORMAL HIGH (ref 70–99)
Potassium: 4.6 mEq/L (ref 3.5–5.1)
Sodium: 139 mEq/L (ref 135–145)

## 2022-03-30 ENCOUNTER — Telehealth: Payer: Self-pay | Admitting: Family Medicine

## 2022-03-30 DIAGNOSIS — H353211 Exudative age-related macular degeneration, right eye, with active choroidal neovascularization: Secondary | ICD-10-CM | POA: Diagnosis not present

## 2022-03-30 DIAGNOSIS — H43821 Vitreomacular adhesion, right eye: Secondary | ICD-10-CM | POA: Diagnosis not present

## 2022-03-30 DIAGNOSIS — H35372 Puckering of macula, left eye: Secondary | ICD-10-CM | POA: Diagnosis not present

## 2022-03-30 DIAGNOSIS — H353121 Nonexudative age-related macular degeneration, left eye, early dry stage: Secondary | ICD-10-CM | POA: Diagnosis not present

## 2022-03-30 DIAGNOSIS — H43819 Vitreous degeneration, unspecified eye: Secondary | ICD-10-CM | POA: Diagnosis not present

## 2022-03-30 NOTE — Telephone Encounter (Signed)
He can be scheduled with me next week or any other provider this week if needed.

## 2022-03-30 NOTE — Telephone Encounter (Signed)
Sent to Access nurse. Patient called stating he is SOB at night. However, he is ok during the day . Patient stated he thinks it may be anxiety.

## 2022-03-30 NOTE — Telephone Encounter (Signed)
Patient was triaged by [facility] Nurse and patient called back to make an appointment for anxiety. At the time of call no appointments were available for today. [facility] doesn't have any available 30 min appts this week.

## 2022-03-30 NOTE — Telephone Encounter (Signed)
I called the patient and he is scheduled tomorrow to see Dr. Wynetta Emery.  [name] [name],cma

## 2022-03-31 ENCOUNTER — Ambulatory Visit: Payer: Self-pay

## 2022-03-31 ENCOUNTER — Encounter: Payer: Self-pay | Admitting: Nurse Practitioner

## 2022-03-31 ENCOUNTER — Ambulatory Visit (INDEPENDENT_AMBULATORY_CARE_PROVIDER_SITE_OTHER): Payer: Medicare Other | Admitting: Nurse Practitioner

## 2022-03-31 ENCOUNTER — Other Ambulatory Visit: Payer: Self-pay

## 2022-03-31 VITALS — BP 138/58 | HR 58 | Temp 97.8°F | Ht 73.0 in | Wt 213.4 lb

## 2022-03-31 DIAGNOSIS — I10 Essential (primary) hypertension: Secondary | ICD-10-CM

## 2022-03-31 DIAGNOSIS — F419 Anxiety disorder, unspecified: Secondary | ICD-10-CM | POA: Diagnosis not present

## 2022-03-31 MED ORDER — HYDROXYZINE PAMOATE 25 MG PO CAPS: 25.0000 mg | ORAL_CAPSULE | Freq: Every day | ORAL | 0 refills | Status: DC

## 2022-03-31 NOTE — Patient Instructions (Signed)
It was nice to meet you.   I will send in a prescription for you to take at bedtime to help with your anxiety.   I have also attached some information for you to review about managing stress and anxiety.   Please schedule a follow up in 2 weeks with myself or Dr. [name].  If symptoms are worsening or not improving please come in sooner or contact our office.

## 2022-03-31 NOTE — Patient Outreach (Signed)
  Care Coordination   Follow Up Visit Note   03/31/2022 Name: [name] [name] MRN: [id number] DOB: 16-Oct-1933  [name] [name] is a 89 y.o. year old male who sees [name], [name], MD for primary care. I spoke with  [name] by phone today.  What matters to the patients health and wellness today?  Blood pressure, blood sugars and shortness of breath    Goals Addressed             This Visit's Progress    Development of plan of care for management of diabetes and new onset shortness of breath       Care Coordination Interventions: Evaluation of current treatment plan related to diabetes / shortness of breath and patient's adherence to plan as established by provide:  Patient reports recent hospitalization for stroke in November 2023,  hospitalization due to flu 03/13/22, and ED visit due to N/V/D and shortness of breath on 03/22/22.  Patient reports having a reduction in his diabetic and blood pressure medication while in the hospital. He states he is concerned because his blood pressure and blood sugars seem to run a little high.  Patient states he is not checking his blood pressure now because his monitor is not working correctly. Patient states he is has shortness of breath if walking more or longer distances. He states he had several episodes at night where he would get up to go to the bathroom and after walking back to bed would get extremely short of breath.  Patient advised to contact his insurance plan regarding coverage assistance for new blood pressure monitor.  Reviewed medications with patient and discussed importance of compliance.  Reviewed scheduled/upcoming provider appointments:   Patient states he is scheduled to see his primary care provider on today 03/31/22, follow up scheduled with the cardiologist next week and follow up with the cardiologist on tomorrow 04/01/22.   Discussed plans with patient for ongoing care management follow up and provided patient with direct  contact information for care management team;  Patient verbally agreed to next telephone follow up outreach with [facility] on 04/12/22.            SDOH assessments and interventions completed:  No     Care Coordination Interventions:  Yes, provided   Follow up plan: Follow up call scheduled for 04/12/22    Encounter Outcome:  Pt. Visit Completed   Quinn Plowman RN,BSN,CCM [place] [phone] direct line

## 2022-03-31 NOTE — Progress Notes (Signed)
Tomasita Morrow, NP-C Phone: [phone]  [name] [name] is a 89 y.o. male who presents today for shortness of breath at night for 2-3 weeks.   Patient believes he is having an increased amount of anxiety at night causing him to become short of breath. He denies any shortness of breath or difficulty breathing during the day. He reports he goes to bed easily, but wakes up in the middle of the night to use the restroom and becomes short of breath during that time. He believes this is due to being worried something is going to happen to him. He has recently had multiple health issues in the last 2 months including a stroke, COVID and flu illnesses. He reports he has never been diagnosed with sleep apnea. He has seen pulmonology in the past, he does not want to return. The only time he has not had an episode is when he took his wife's Xanax before bed. He has been sleeping in his recliner so he does not have to get up to use the restroom, as he uses a bedside urinal, which has helped with his symptoms, however he is not getting good sleep. He does have a follow up appointment with his Cardiologist next week. He had a recent chest xray that was normal. Denies chest pain. Denies increased edema.    Social History   Tobacco Use  Smoking Status Never  Smokeless Tobacco Never    Current Outpatient Medications on File Prior to Visit  Medication Sig Dispense Refill   amiodarone (PACERONE) 200 MG tablet Take 200 mg by mouth daily.     apixaban (ELIQUIS) 2.5 MG TABS tablet Take 1 tablet (2.5 mg total) by mouth 2 (two) times daily. 120 tablet 0   BD PEN NEEDLE NANO U/F 32G X 4 MM MISC USE AS DIRECTED 100 each 0   carvedilol (COREG) 12.5 MG tablet TAKE ONE (1) TABLET BY MOUTH TWO TIMES PER DAY (Patient taking differently: Take 12.5 mg by mouth 2 (two) times daily with a meal.) 60 tablet 0   Cholecalciferol (VITAMIN D3) 50 MCG (2000 UT) TABS Take 2,000 Units by mouth in the morning.     doxazosin (CARDURA) 2  MG tablet Take 1 tablet (2 mg total) by mouth 2 (two) times daily. Please keep upcoming appt for future refills 180 tablet 0   ezetimibe (ZETIA) 10 MG tablet TAKE 1 TABLET BY MOUTH DAILY 90 tablet 0   finasteride (PROSCAR) 5 MG tablet Take 1 tablet (5 mg total) by mouth daily. (Patient taking differently: Take 5 mg by mouth every evening.) 90 tablet 1   furosemide (LASIX) 20 MG tablet Take 20 mg by mouth. Patient states he takes as needed depending on weight gain as recommended by provider     isosorbide mononitrate (IMDUR) 30 MG 24 hr tablet Take 1 tablet (30 mg total) by mouth daily. Please call [phone] for a sooner appointment and for further refills. 30 tablet 0   loperamide (IMODIUM A-D) 2 MG tablet Take 1 tablet (2 mg total) by mouth 3 (three) times daily as needed for diarrhea or loose stools. 30 tablet 0   Multiple Vitamins-Minerals (PRESERVISION AREDS 2 PO) Take 1 tablet by mouth in the morning and at bedtime.     omeprazole (PRILOSEC) 20 MG capsule Take 1 capsule (20 mg total) by mouth 2 (two) times daily. 60 capsule 1   ondansetron (ZOFRAN-ODT) 4 MG disintegrating tablet Take 1 tablet (4 mg total) by mouth every 8 (eight) hours  as needed for nausea or vomiting. 20 tablet 0   potassium chloride (KLOR-CON) 10 MEQ tablet Take 1 tablet (10 mEq total) by mouth daily as needed. Take with torsemide 90 tablet 3   TOUJEO SOLOSTAR 300 UNIT/ML Solostar Pen Inject 10 Units into the skin daily. 22.5 mL 6   guaiFENesin-codeine 100-10 MG/5ML syrup Take 10 mLs by mouth 3 (three) times daily as needed for cough. (Patient not taking: Reported on 03/31/2022) 120 mL 0   hydrALAZINE (APRESOLINE) 25 MG tablet Take 1 tablet (25 mg total) by mouth 3 (three) times daily. (Patient not taking: Reported on 03/31/2022) 270 tablet 3   No current facility-administered medications on file prior to visit.     ROS see history of present illness  Objective  Physical Exam Vitals:   03/31/22 1608  BP: (!) 138/58   Pulse: (!) 58  Temp: 97.8 F (36.6 C)  SpO2: 96%    BP Readings from Last 3 Encounters:  03/31/22 (!) 138/58  03/28/22 (!) 172/69  03/23/22 120/60   Wt Readings from Last 3 Encounters:  03/31/22 213 lb 6.4 oz (96.8 kg)  03/28/22 221 lb (100.2 kg)  03/23/22 221 lb 6.4 oz (100.4 kg)    Physical Exam Constitutional:      General: He is not in acute distress.    Appearance: Normal appearance.  HENT:     Head: Normocephalic.  Cardiovascular:     Rate and Rhythm: Normal rate and regular rhythm.     Heart sounds: Normal heart sounds.  Pulmonary:     Effort: Pulmonary effort is normal. No respiratory distress.     Breath sounds: Normal breath sounds.  Lymphadenopathy:     Cervical: No cervical adenopathy.  Skin:    General: Skin is warm and dry.  Neurological:     General: No focal deficit present.     Mental Status: He is alert.  Psychiatric:        Mood and Affect: Mood normal.        Behavior: Behavior normal.    Assessment/Plan: Please see individual problem list.  Anxiety Assessment & Plan: Patient endorses a lot of anxiety after recent health problems. Concern for OSA, patient declined referral to Pulmonology. Will trial Vistaril at bedtime. Follow up in 2 weeks, advised patient to return if symptoms are not improving or worsening before then. Encouraged to seek care if worsening shortness of breath.   Orders: -     hydrOXYzine Pamoate; Take 1 capsule (25 mg total) by mouth at bedtime.  Dispense: 30 capsule; Refill: 0   Return in about 2 weeks (around 04/14/2022) for Anxiety.   Tomasita Morrow, NP-C [place]

## 2022-04-01 ENCOUNTER — Telehealth: Payer: Self-pay

## 2022-04-01 DIAGNOSIS — F419 Anxiety disorder, unspecified: Secondary | ICD-10-CM | POA: Insufficient documentation

## 2022-04-01 DIAGNOSIS — E1122 Type 2 diabetes mellitus with diabetic chronic kidney disease: Secondary | ICD-10-CM | POA: Diagnosis not present

## 2022-04-01 DIAGNOSIS — Z794 Long term (current) use of insulin: Secondary | ICD-10-CM | POA: Diagnosis not present

## 2022-04-01 DIAGNOSIS — E118 Type 2 diabetes mellitus with unspecified complications: Secondary | ICD-10-CM

## 2022-04-01 DIAGNOSIS — E785 Hyperlipidemia, unspecified: Secondary | ICD-10-CM | POA: Diagnosis not present

## 2022-04-01 DIAGNOSIS — E1169 Type 2 diabetes mellitus with other specified complication: Secondary | ICD-10-CM | POA: Diagnosis not present

## 2022-04-01 DIAGNOSIS — E1159 Type 2 diabetes mellitus with other circulatory complications: Secondary | ICD-10-CM | POA: Diagnosis not present

## 2022-04-01 DIAGNOSIS — N1831 Chronic kidney disease, stage 3a: Secondary | ICD-10-CM | POA: Diagnosis not present

## 2022-04-01 NOTE — Assessment & Plan Note (Signed)
Patient endorses a lot of anxiety after recent health problems. Concern for OSA, patient declined referral to Pulmonology. Will trial Vistaril at bedtime. Follow up in 2 weeks, advised patient to return if symptoms are not improving or worsening before then. Encouraged to seek care if worsening shortness of breath.

## 2022-04-01 NOTE — Telephone Encounter (Signed)
Colletta [place] called from [place] to state patient was scheduled to have an OT evaluation today, but he would like to move it to next Wednesday (04/06/2022) because he has a lot of other appointments and things going on.

## 2022-04-01 NOTE — Telephone Encounter (Signed)
Noted  

## 2022-04-04 ENCOUNTER — Ambulatory Visit: Payer: Medicare Other | Admitting: Nurse Practitioner

## 2022-04-04 DIAGNOSIS — E1165 Type 2 diabetes mellitus with hyperglycemia: Secondary | ICD-10-CM | POA: Diagnosis not present

## 2022-04-05 ENCOUNTER — Other Ambulatory Visit: Payer: Self-pay

## 2022-04-05 DIAGNOSIS — N179 Acute kidney failure, unspecified: Secondary | ICD-10-CM | POA: Diagnosis not present

## 2022-04-05 DIAGNOSIS — Z7901 Long term (current) use of anticoagulants: Secondary | ICD-10-CM | POA: Diagnosis not present

## 2022-04-05 DIAGNOSIS — I2510 Atherosclerotic heart disease of native coronary artery without angina pectoris: Secondary | ICD-10-CM | POA: Diagnosis not present

## 2022-04-05 DIAGNOSIS — Z5982 Transportation insecurity: Secondary | ICD-10-CM

## 2022-04-05 DIAGNOSIS — Z87891 Personal history of nicotine dependence: Secondary | ICD-10-CM | POA: Diagnosis not present

## 2022-04-05 DIAGNOSIS — Z794 Long term (current) use of insulin: Secondary | ICD-10-CM | POA: Diagnosis not present

## 2022-04-05 DIAGNOSIS — J101 Influenza due to other identified influenza virus with other respiratory manifestations: Secondary | ICD-10-CM | POA: Diagnosis not present

## 2022-04-05 DIAGNOSIS — N1832 Chronic kidney disease, stage 3b: Secondary | ICD-10-CM | POA: Diagnosis not present

## 2022-04-05 DIAGNOSIS — Z8673 Personal history of transient ischemic attack (TIA), and cerebral infarction without residual deficits: Secondary | ICD-10-CM

## 2022-04-05 DIAGNOSIS — Z951 Presence of aortocoronary bypass graft: Secondary | ICD-10-CM | POA: Diagnosis not present

## 2022-04-05 DIAGNOSIS — I4821 Permanent atrial fibrillation: Secondary | ICD-10-CM | POA: Diagnosis not present

## 2022-04-05 DIAGNOSIS — N183 Chronic kidney disease, stage 3 unspecified: Secondary | ICD-10-CM | POA: Diagnosis not present

## 2022-04-05 DIAGNOSIS — I130 Hypertensive heart and chronic kidney disease with heart failure and stage 1 through stage 4 chronic kidney disease, or unspecified chronic kidney disease: Secondary | ICD-10-CM | POA: Diagnosis not present

## 2022-04-05 DIAGNOSIS — E1122 Type 2 diabetes mellitus with diabetic chronic kidney disease: Secondary | ICD-10-CM | POA: Diagnosis not present

## 2022-04-05 DIAGNOSIS — I5032 Chronic diastolic (congestive) heart failure: Secondary | ICD-10-CM | POA: Diagnosis not present

## 2022-04-05 DIAGNOSIS — E785 Hyperlipidemia, unspecified: Secondary | ICD-10-CM | POA: Diagnosis not present

## 2022-04-05 DIAGNOSIS — Z9181 History of falling: Secondary | ICD-10-CM

## 2022-04-05 DIAGNOSIS — I480 Paroxysmal atrial fibrillation: Secondary | ICD-10-CM | POA: Diagnosis not present

## 2022-04-05 DIAGNOSIS — I5030 Unspecified diastolic (congestive) heart failure: Secondary | ICD-10-CM | POA: Diagnosis not present

## 2022-04-05 DIAGNOSIS — Z8616 Personal history of COVID-19: Secondary | ICD-10-CM

## 2022-04-05 DIAGNOSIS — K219 Gastro-esophageal reflux disease without esophagitis: Secondary | ICD-10-CM

## 2022-04-05 DIAGNOSIS — J9601 Acute respiratory failure with hypoxia: Secondary | ICD-10-CM | POA: Diagnosis not present

## 2022-04-05 DIAGNOSIS — B3749 Other urogenital candidiasis: Secondary | ICD-10-CM | POA: Diagnosis not present

## 2022-04-05 DIAGNOSIS — E11649 Type 2 diabetes mellitus with hypoglycemia without coma: Secondary | ICD-10-CM | POA: Diagnosis not present

## 2022-04-05 MED ORDER — INSULIN GLARGINE (1 UNIT DIAL) 300 UNIT/ML ~~LOC~~ SOPN: 40.0000 [IU] | PEN_INJECTOR | Freq: Every day | SUBCUTANEOUS | 3 refills | Status: DC

## 2022-04-07 DIAGNOSIS — Z7901 Long term (current) use of anticoagulants: Secondary | ICD-10-CM | POA: Diagnosis not present

## 2022-04-07 DIAGNOSIS — E11649 Type 2 diabetes mellitus with hypoglycemia without coma: Secondary | ICD-10-CM | POA: Diagnosis not present

## 2022-04-07 DIAGNOSIS — K219 Gastro-esophageal reflux disease without esophagitis: Secondary | ICD-10-CM | POA: Diagnosis not present

## 2022-04-07 DIAGNOSIS — N1832 Chronic kidney disease, stage 3b: Secondary | ICD-10-CM | POA: Diagnosis not present

## 2022-04-07 DIAGNOSIS — Z8673 Personal history of transient ischemic attack (TIA), and cerebral infarction without residual deficits: Secondary | ICD-10-CM | POA: Diagnosis not present

## 2022-04-07 DIAGNOSIS — E1129 Type 2 diabetes mellitus with other diabetic kidney complication: Secondary | ICD-10-CM | POA: Diagnosis not present

## 2022-04-07 DIAGNOSIS — I4821 Permanent atrial fibrillation: Secondary | ICD-10-CM | POA: Diagnosis not present

## 2022-04-07 DIAGNOSIS — I10 Essential (primary) hypertension: Secondary | ICD-10-CM | POA: Diagnosis not present

## 2022-04-07 DIAGNOSIS — Z794 Long term (current) use of insulin: Secondary | ICD-10-CM | POA: Diagnosis not present

## 2022-04-07 DIAGNOSIS — Z9181 History of falling: Secondary | ICD-10-CM | POA: Diagnosis not present

## 2022-04-07 DIAGNOSIS — E1122 Type 2 diabetes mellitus with diabetic chronic kidney disease: Secondary | ICD-10-CM | POA: Diagnosis not present

## 2022-04-07 DIAGNOSIS — I130 Hypertensive heart and chronic kidney disease with heart failure and stage 1 through stage 4 chronic kidney disease, or unspecified chronic kidney disease: Secondary | ICD-10-CM | POA: Diagnosis not present

## 2022-04-07 DIAGNOSIS — Z5982 Transportation insecurity: Secondary | ICD-10-CM | POA: Diagnosis not present

## 2022-04-07 DIAGNOSIS — Z8616 Personal history of COVID-19: Secondary | ICD-10-CM | POA: Diagnosis not present

## 2022-04-07 DIAGNOSIS — R809 Proteinuria, unspecified: Secondary | ICD-10-CM | POA: Diagnosis not present

## 2022-04-07 DIAGNOSIS — Z951 Presence of aortocoronary bypass graft: Secondary | ICD-10-CM | POA: Diagnosis not present

## 2022-04-07 DIAGNOSIS — I5032 Chronic diastolic (congestive) heart failure: Secondary | ICD-10-CM | POA: Diagnosis not present

## 2022-04-07 DIAGNOSIS — E785 Hyperlipidemia, unspecified: Secondary | ICD-10-CM | POA: Diagnosis not present

## 2022-04-07 DIAGNOSIS — B3749 Other urogenital candidiasis: Secondary | ICD-10-CM | POA: Diagnosis not present

## 2022-04-07 DIAGNOSIS — N1831 Chronic kidney disease, stage 3a: Secondary | ICD-10-CM | POA: Diagnosis not present

## 2022-04-07 DIAGNOSIS — N179 Acute kidney failure, unspecified: Secondary | ICD-10-CM | POA: Diagnosis not present

## 2022-04-07 DIAGNOSIS — J9601 Acute respiratory failure with hypoxia: Secondary | ICD-10-CM | POA: Diagnosis not present

## 2022-04-07 DIAGNOSIS — I2510 Atherosclerotic heart disease of native coronary artery without angina pectoris: Secondary | ICD-10-CM | POA: Diagnosis not present

## 2022-04-08 ENCOUNTER — Other Ambulatory Visit: Payer: Self-pay

## 2022-04-08 ENCOUNTER — Other Ambulatory Visit: Payer: Self-pay | Admitting: Pharmacy Technician

## 2022-04-08 DIAGNOSIS — N1832 Chronic kidney disease, stage 3b: Secondary | ICD-10-CM | POA: Diagnosis not present

## 2022-04-08 DIAGNOSIS — Z794 Long term (current) use of insulin: Secondary | ICD-10-CM | POA: Diagnosis not present

## 2022-04-08 DIAGNOSIS — N179 Acute kidney failure, unspecified: Secondary | ICD-10-CM | POA: Diagnosis not present

## 2022-04-08 DIAGNOSIS — I4821 Permanent atrial fibrillation: Secondary | ICD-10-CM | POA: Diagnosis not present

## 2022-04-08 DIAGNOSIS — J9601 Acute respiratory failure with hypoxia: Secondary | ICD-10-CM | POA: Diagnosis not present

## 2022-04-08 DIAGNOSIS — Z7901 Long term (current) use of anticoagulants: Secondary | ICD-10-CM | POA: Diagnosis not present

## 2022-04-08 DIAGNOSIS — Z8673 Personal history of transient ischemic attack (TIA), and cerebral infarction without residual deficits: Secondary | ICD-10-CM | POA: Diagnosis not present

## 2022-04-08 DIAGNOSIS — E1122 Type 2 diabetes mellitus with diabetic chronic kidney disease: Secondary | ICD-10-CM | POA: Diagnosis not present

## 2022-04-08 DIAGNOSIS — Z951 Presence of aortocoronary bypass graft: Secondary | ICD-10-CM | POA: Diagnosis not present

## 2022-04-08 DIAGNOSIS — E785 Hyperlipidemia, unspecified: Secondary | ICD-10-CM | POA: Diagnosis not present

## 2022-04-08 DIAGNOSIS — I2510 Atherosclerotic heart disease of native coronary artery without angina pectoris: Secondary | ICD-10-CM | POA: Diagnosis not present

## 2022-04-08 DIAGNOSIS — E11649 Type 2 diabetes mellitus with hypoglycemia without coma: Secondary | ICD-10-CM | POA: Diagnosis not present

## 2022-04-08 DIAGNOSIS — Z8616 Personal history of COVID-19: Secondary | ICD-10-CM | POA: Diagnosis not present

## 2022-04-08 DIAGNOSIS — B3749 Other urogenital candidiasis: Secondary | ICD-10-CM | POA: Diagnosis not present

## 2022-04-08 DIAGNOSIS — I130 Hypertensive heart and chronic kidney disease with heart failure and stage 1 through stage 4 chronic kidney disease, or unspecified chronic kidney disease: Secondary | ICD-10-CM | POA: Diagnosis not present

## 2022-04-08 DIAGNOSIS — Z5982 Transportation insecurity: Secondary | ICD-10-CM | POA: Diagnosis not present

## 2022-04-08 DIAGNOSIS — Z9181 History of falling: Secondary | ICD-10-CM | POA: Diagnosis not present

## 2022-04-08 DIAGNOSIS — I5032 Chronic diastolic (congestive) heart failure: Secondary | ICD-10-CM | POA: Diagnosis not present

## 2022-04-08 DIAGNOSIS — K219 Gastro-esophageal reflux disease without esophagitis: Secondary | ICD-10-CM | POA: Diagnosis not present

## 2022-04-08 NOTE — Progress Notes (Signed)
[employer] received a re-enrollment PAP application from Dr. Marlaine Hind office for the [employer].  I was unaware.  I submitted a PAP application for re-enrollment on 02/09/22.  [employer] will not ship medication to pharmacy.  Will only ship to Dr. Ellen Henri office.  Contacted patient and made aware.  Jacquelynn Cree Patient Advocate Specialist [place] at [facility]

## 2022-04-11 ENCOUNTER — Other Ambulatory Visit (INDEPENDENT_AMBULATORY_CARE_PROVIDER_SITE_OTHER): Payer: Medicare Other

## 2022-04-11 DIAGNOSIS — R809 Proteinuria, unspecified: Secondary | ICD-10-CM | POA: Diagnosis not present

## 2022-04-11 DIAGNOSIS — I10 Essential (primary) hypertension: Secondary | ICD-10-CM | POA: Diagnosis not present

## 2022-04-11 DIAGNOSIS — E118 Type 2 diabetes mellitus with unspecified complications: Secondary | ICD-10-CM

## 2022-04-11 DIAGNOSIS — E1122 Type 2 diabetes mellitus with diabetic chronic kidney disease: Secondary | ICD-10-CM | POA: Diagnosis not present

## 2022-04-11 DIAGNOSIS — N1831 Chronic kidney disease, stage 3a: Secondary | ICD-10-CM

## 2022-04-11 DIAGNOSIS — N189 Chronic kidney disease, unspecified: Secondary | ICD-10-CM | POA: Diagnosis not present

## 2022-04-11 LAB — BASIC METABOLIC PANEL
BUN: 19 mg/dL (ref 6–23)
CO2: 26 mEq/L (ref 19–32)
Calcium: 8.4 mg/dL (ref 8.4–10.5)
Chloride: 105 mEq/L (ref 96–112)
Creatinine, Ser: 1.5 mg/dL (ref 0.40–1.50)
GFR: 41.91 mL/min — ABNORMAL LOW (ref >60.00)
Glucose, Bld: 227 mg/dL — ABNORMAL HIGH (ref 70–99)
Potassium: 4.4 mEq/L (ref 3.5–5.1)
Sodium: 137 mEq/L (ref 135–145)

## 2022-04-11 LAB — HEMOGLOBIN A1C: Hgb A1c MFr Bld: 6.6 % — ABNORMAL HIGH (ref 4.6–6.5)

## 2022-04-11 LAB — MICROALBUMIN / CREATININE URINE RATIO: Microalb Creat Ratio: 654

## 2022-04-12 ENCOUNTER — Telehealth: Payer: Self-pay | Admitting: Family Medicine

## 2022-04-12 ENCOUNTER — Ambulatory Visit: Payer: Self-pay

## 2022-04-12 DIAGNOSIS — Z794 Long term (current) use of insulin: Secondary | ICD-10-CM | POA: Diagnosis not present

## 2022-04-12 DIAGNOSIS — E1122 Type 2 diabetes mellitus with diabetic chronic kidney disease: Secondary | ICD-10-CM | POA: Diagnosis not present

## 2022-04-12 DIAGNOSIS — N1831 Chronic kidney disease, stage 3a: Secondary | ICD-10-CM | POA: Diagnosis not present

## 2022-04-12 NOTE — Patient Outreach (Signed)
  Care Coordination   Follow Up Visit Note   04/12/2022 Name: [name] [name] MRN: [id number] DOB: Nov 13, 1933  [name] [name] is a 89 y.o. year old male who sees [name], [name], MD for primary care. I spoke with  [name] by phone today.  What matters to the patients health and wellness today?  " I'm concerned that my blood sugars are running to high"    Goals Addressed             This Visit's Progress    Development of plan of care for management of diabetes and new onset shortness of breath       Interventions Today    Flowsheet Row Most Recent Value  Chronic Disease Discussed/Reviewed   Chronic disease discussed/reviewed during today's visit Diabetes  General Interventions   General Interventions Discussed/Reviewed General Interventions Reviewed, Doctor Visits  Doctor Visits Discussed/Reviewed Doctor Visits Reviewed       Care Coordination Interventions: Evaluation of current treatment plan related to diabetes / shortness of breath and patient's adherence to plan as established by provide:  Patient  states his blood sugars have been elevated. Reports today's blood sugar was 274 and yesterday's was 200. Patient states his insulin was decreased while in the hospital.  Patient states he is on his way to meet with the nurse at his providers office to be education on his freestyle libre.  He states he requested to speak with the doctor while there to have his insulin readjusted.  Patient states the Visteral he was prescribed is helping a lot with the anxiety he was experiencing at night.   Patient advised to contact his insurance plan regarding coverage assistance for new blood pressure monitor.  Reviewed medications with patient and discussed importance of compliance.  Reviewed scheduled/upcoming provider appointments:   Discussed plans with patient for ongoing care management follow up and provided patient with direct contact information for care management team;   Patient verbally agreed to next telephone follow up outreach with [facility] on 04/25/22 Encouraged to contact [facility] for questions and/ or care coordination needs.  Discussed patients follow up visit with nephrology and endocrinologist.            SDOH assessments and interventions completed:  No     Care Coordination Interventions:  Yes, provided   Follow up plan: Follow up call scheduled for 04/25/22     Quinn Plowman RN,BSN,CCM [place] [phone] direct line  Encounter Outcome:  Pt. Visit Completed

## 2022-04-12 NOTE — Telephone Encounter (Signed)
Lft pt vm to call ofc . thanks 

## 2022-04-13 ENCOUNTER — Ambulatory Visit (INDEPENDENT_AMBULATORY_CARE_PROVIDER_SITE_OTHER): Payer: Medicare Other | Admitting: Family Medicine

## 2022-04-13 ENCOUNTER — Encounter: Payer: Self-pay | Admitting: Family Medicine

## 2022-04-13 VITALS — BP 130/60 | HR 60 | Temp 98.5°F | Ht 73.0 in | Wt 207.8 lb

## 2022-04-13 DIAGNOSIS — F419 Anxiety disorder, unspecified: Secondary | ICD-10-CM

## 2022-04-13 DIAGNOSIS — I5032 Chronic diastolic (congestive) heart failure: Secondary | ICD-10-CM

## 2022-04-13 NOTE — Assessment & Plan Note (Signed)
Appears euvolemic today.  Patient will continue to monitor his weight.  He was encouraged to contact us or cardiology if he develops weight gain of more than 2 pounds overnight or more than 5 pounds in a week.

## 2022-04-13 NOTE — Progress Notes (Signed)
[name], MD Phone: [phone]  [name] [name] is a 89 y.o. male who presents today for f/u.  Anxiety: Patient notes this has improved significantly since he was last seen.  He was having a lot of anxiety due to having trouble breathing at night when he laid down.  They bought a new bed that he can elevate the head of and that has helped significantly.  He has not had to take the Atarax since a day or 2 after he was last seen.  He notes no depression.  CHF: Patient notes elevating the head of his bed has helped with the orthopnea.  He is not swelling as much.  He takes Lasix about twice a week.  No change to his chronic dyspnea.  His weight has gone down to 202 pounds.  He is eating smaller portion sizes.  His appetite has been improving.  Social History   Tobacco Use  Smoking Status Never  Smokeless Tobacco Never    Current Outpatient Medications on File Prior to Visit  Medication Sig Dispense Refill   amiodarone (PACERONE) 200 MG tablet Take 200 mg by mouth daily.     apixaban (ELIQUIS) 2.5 MG TABS tablet Take 1 tablet (2.5 mg total) by mouth 2 (two) times daily. 120 tablet 0   BD PEN NEEDLE NANO U/F 32G X 4 MM MISC USE AS DIRECTED 100 each 0   carvedilol (COREG) 12.5 MG tablet TAKE ONE (1) TABLET BY MOUTH TWO TIMES PER DAY (Patient taking differently: Take 12.5 mg by mouth 2 (two) times daily with a meal.) 60 tablet 0   Cholecalciferol (VITAMIN D3) 50 MCG (2000 UT) TABS Take 2,000 Units by mouth in the morning.     doxazosin (CARDURA) 2 MG tablet Take 1 tablet (2 mg total) by mouth 2 (two) times daily. Please keep upcoming appt for future refills 180 tablet 0   ezetimibe (ZETIA) 10 MG tablet TAKE 1 TABLET BY MOUTH DAILY 90 tablet 0   finasteride (PROSCAR) 5 MG tablet Take 1 tablet (5 mg total) by mouth daily. (Patient taking differently: Take 5 mg by mouth every evening.) 90 tablet 1   furosemide (LASIX) 20 MG tablet Take 20 mg by mouth. Patient states he takes as needed  depending on weight gain as recommended by provider     guaiFENesin-codeine 100-10 MG/5ML syrup Take 10 mLs by mouth 3 (three) times daily as needed for cough. 120 mL 0   hydrALAZINE (APRESOLINE) 25 MG tablet Take 1 tablet (25 mg total) by mouth 3 (three) times daily. 270 tablet 3   insulin glargine, 1 Unit Dial, (TOUJEO) 300 UNIT/ML Solostar Pen Inject 40 Units into the skin daily. 4.5 mL 3   isosorbide mononitrate (IMDUR) 30 MG 24 hr tablet Take 1 tablet (30 mg total) by mouth daily. Please call [phone] for a sooner appointment and for further refills. 30 tablet 0   loperamide (IMODIUM A-D) 2 MG tablet Take 1 tablet (2 mg total) by mouth 3 (three) times daily as needed for diarrhea or loose stools. 30 tablet 0   Multiple Vitamins-Minerals (PRESERVISION AREDS 2 PO) Take 1 tablet by mouth in the morning and at bedtime.     omeprazole (PRILOSEC) 20 MG capsule Take 1 capsule (20 mg total) by mouth 2 (two) times daily. 60 capsule 1   ondansetron (ZOFRAN-ODT) 4 MG disintegrating tablet Take 1 tablet (4 mg total) by mouth every 8 (eight) hours as needed for nausea or vomiting. 20 tablet 0  potassium chloride (KLOR-CON) 10 MEQ tablet Take 1 tablet (10 mEq total) by mouth daily as needed. Take with torsemide 90 tablet 3   TOUJEO SOLOSTAR 300 UNIT/ML Solostar Pen Inject 10 Units into the skin daily. 22.5 mL 6   No current facility-administered medications on file prior to visit.     ROS see history of present illness  Objective  Physical Exam Vitals:   04/13/22 1213  BP: 130/60  Pulse: 60  Temp: 98.5 F (36.9 C)  SpO2: 96%    BP Readings from Last 3 Encounters:  04/13/22 130/60  03/31/22 (!) 138/58  03/28/22 (!) 172/69   Wt Readings from Last 3 Encounters:  04/13/22 207 lb 12.8 oz (94.3 kg)  03/31/22 213 lb 6.4 oz (96.8 kg)  03/28/22 221 lb (100.2 kg)    Physical Exam Constitutional:      General: He is not in acute distress.    Appearance: He is not diaphoretic.   Cardiovascular:     Rate and Rhythm: Normal rate and regular rhythm.     Heart sounds: Normal heart sounds.  Pulmonary:     Effort: Pulmonary effort is normal.     Breath sounds: Normal breath sounds.  Musculoskeletal:     Right lower leg: No edema.     Left lower leg: No edema.  Skin:    General: Skin is warm and dry.  Neurological:     Mental Status: He is alert.      Assessment/Plan: Please see individual problem list.  Anxiety Assessment & Plan: Currently asymptomatic.  I advised against taking any further Atarax given the risk of QT prolongation of this medication in combination with his amiodarone.  Discussed if he has recurrent anxiety should let me know and we could consider clonazepam.   Chronic diastolic CHF (congestive heart failure) ([place]) Assessment & Plan: Appears euvolemic today.  Patient will continue to monitor his weight.  He was encouraged to contact us or cardiology if he develops weight gain of more than 2 pounds overnight or more than 5 pounds in a week.      Return if symptoms worsen or fail to improve.   [name], MD [place]

## 2022-04-13 NOTE — Patient Instructions (Signed)
Let me know if your anxiety comes back and we could consider klonopin for acute anxiety.

## 2022-04-13 NOTE — Assessment & Plan Note (Signed)
Currently asymptomatic.  I advised against taking any further Atarax given the risk of QT prolongation of this medication in combination with his amiodarone.  Discussed if he has recurrent anxiety should let me know and we could consider clonazepam.

## 2022-04-14 DIAGNOSIS — E1159 Type 2 diabetes mellitus with other circulatory complications: Secondary | ICD-10-CM | POA: Diagnosis not present

## 2022-04-14 DIAGNOSIS — E1122 Type 2 diabetes mellitus with diabetic chronic kidney disease: Secondary | ICD-10-CM | POA: Diagnosis not present

## 2022-04-14 DIAGNOSIS — Z8616 Personal history of COVID-19: Secondary | ICD-10-CM | POA: Diagnosis not present

## 2022-04-14 DIAGNOSIS — Z951 Presence of aortocoronary bypass graft: Secondary | ICD-10-CM | POA: Diagnosis not present

## 2022-04-14 DIAGNOSIS — Z7901 Long term (current) use of anticoagulants: Secondary | ICD-10-CM | POA: Diagnosis not present

## 2022-04-14 DIAGNOSIS — I4821 Permanent atrial fibrillation: Secondary | ICD-10-CM | POA: Diagnosis not present

## 2022-04-14 DIAGNOSIS — N179 Acute kidney failure, unspecified: Secondary | ICD-10-CM | POA: Diagnosis not present

## 2022-04-14 DIAGNOSIS — K219 Gastro-esophageal reflux disease without esophagitis: Secondary | ICD-10-CM | POA: Diagnosis not present

## 2022-04-14 DIAGNOSIS — Z8673 Personal history of transient ischemic attack (TIA), and cerebral infarction without residual deficits: Secondary | ICD-10-CM | POA: Diagnosis not present

## 2022-04-14 DIAGNOSIS — E1169 Type 2 diabetes mellitus with other specified complication: Secondary | ICD-10-CM | POA: Diagnosis not present

## 2022-04-14 DIAGNOSIS — N1832 Chronic kidney disease, stage 3b: Secondary | ICD-10-CM | POA: Diagnosis not present

## 2022-04-14 DIAGNOSIS — I5032 Chronic diastolic (congestive) heart failure: Secondary | ICD-10-CM | POA: Diagnosis not present

## 2022-04-14 DIAGNOSIS — E785 Hyperlipidemia, unspecified: Secondary | ICD-10-CM | POA: Diagnosis not present

## 2022-04-14 DIAGNOSIS — Z9181 History of falling: Secondary | ICD-10-CM | POA: Diagnosis not present

## 2022-04-14 DIAGNOSIS — E11649 Type 2 diabetes mellitus with hypoglycemia without coma: Secondary | ICD-10-CM | POA: Diagnosis not present

## 2022-04-14 DIAGNOSIS — J9601 Acute respiratory failure with hypoxia: Secondary | ICD-10-CM | POA: Diagnosis not present

## 2022-04-14 DIAGNOSIS — N1831 Chronic kidney disease, stage 3a: Secondary | ICD-10-CM | POA: Diagnosis not present

## 2022-04-14 DIAGNOSIS — B3749 Other urogenital candidiasis: Secondary | ICD-10-CM | POA: Diagnosis not present

## 2022-04-14 DIAGNOSIS — Z5982 Transportation insecurity: Secondary | ICD-10-CM | POA: Diagnosis not present

## 2022-04-14 DIAGNOSIS — Z794 Long term (current) use of insulin: Secondary | ICD-10-CM | POA: Diagnosis not present

## 2022-04-14 DIAGNOSIS — I2510 Atherosclerotic heart disease of native coronary artery without angina pectoris: Secondary | ICD-10-CM | POA: Diagnosis not present

## 2022-04-14 DIAGNOSIS — I130 Hypertensive heart and chronic kidney disease with heart failure and stage 1 through stage 4 chronic kidney disease, or unspecified chronic kidney disease: Secondary | ICD-10-CM | POA: Diagnosis not present

## 2022-04-25 ENCOUNTER — Ambulatory Visit: Payer: Self-pay

## 2022-04-25 NOTE — Patient Outreach (Signed)
  Care Coordination   04/25/2022 Name: [name] [name] MRN: [id number] DOB: 02-11-1934   Care Coordination Outreach Attempts:  Successful outreach to patient. Patient states he recently received a CGB.  Reports blood sugars range from 125- 140 and reports insulin decreased to 24 u.  Patient states he is currently out of town and is not able to continue phone call with RNCM.   Patient verbally agreed to telephone follow up visit.   Follow Up Plan:  Additional outreach attempts will be made to offer the patient care coordination information and services.   Encounter Outcome:  Pt. Request to Call Back   Care Coordination Interventions:  No, not indicated    Quinn Plowman [facility] [place] [phone] direct line

## 2022-05-05 DIAGNOSIS — E1165 Type 2 diabetes mellitus with hyperglycemia: Secondary | ICD-10-CM | POA: Diagnosis not present

## 2022-05-09 ENCOUNTER — Ambulatory Visit: Payer: Medicare Other | Admitting: Family Medicine

## 2022-05-11 ENCOUNTER — Ambulatory Visit: Payer: Self-pay

## 2022-05-11 DIAGNOSIS — Z794 Long term (current) use of insulin: Secondary | ICD-10-CM | POA: Diagnosis not present

## 2022-05-11 DIAGNOSIS — N1831 Chronic kidney disease, stage 3a: Secondary | ICD-10-CM | POA: Diagnosis not present

## 2022-05-11 DIAGNOSIS — E1159 Type 2 diabetes mellitus with other circulatory complications: Secondary | ICD-10-CM | POA: Diagnosis not present

## 2022-05-11 DIAGNOSIS — E1122 Type 2 diabetes mellitus with diabetic chronic kidney disease: Secondary | ICD-10-CM | POA: Diagnosis not present

## 2022-05-11 NOTE — Patient Outreach (Signed)
  Care Coordination   Follow Up Visit Note   05/11/2022 Name: [name] [name] MRN: [id number] DOB: 02/10/1934  [name] [name] is a 89 y.o. year old male who sees [name] [name], [name] Adam, MD for primary care. I spoke with  [name] [name] by phone today.  What matters to the patients health and wellness today?  Patient states he is really enjoying his Freestyle libre monitor. He reports his blood sugar range from 120's to 150's.  Patient states he has noticed his blood sugars trending higher as the day progresses.  He states routinely his blood sugars in the middle of the night are ranging in the 300's.  Patient states he has reported this to his endocrinologist and she prescribed him Ozempic.  He states he is currently waiting for a medication assistance application to mailed to him for the [place].  Patient states he has started taking the [place]. Unable to give [name] exact dose due to not being home at time of call. Patient reports his weight is down to 205 lbs from 210.  He states he is happy about this and seems to be maintaining his weight.     Goals Addressed             This Visit's Progress    Development of plan of care for management of diabetes and new onset shortness of breath       Interventions Today    Flowsheet Row Most Recent Value  Chronic Disease   Chronic disease during today's visit Diabetes  [Evaluation of current treatment plan related to diabetes and patients adherence to plan as established by provider.]  General Interventions   General Interventions Discussed/Reviewed General Interventions Reviewed, Labs, Doctor Visits  [facility] assessed patients blood sugar range.  Discussed recent Hgb A1 c  results. Discussed ongoing care coordination follow up with RNCM. Advised to continue monitoring blood sugars daily.]  Doctor Visits Discussed/Reviewed Doctor Visits Reviewed  [name] [name] most recent endocrinology visit note with patient. Advised to keep follow up appointments with  providers.]  Nutrition Interventions   Nutrition Discussed/Reviewed Carbohydrate meal planning, Nutrition Reviewed  [Discussed adhering to lower carb meal plan.]  Pharmacy Interventions   Pharmacy Dicussed/Reviewed Pharmacy Topics Reviewed  [Discussed with patient addition of Ozempic and farxiga to patients treatment plan.  Advised to notify provider of any new symptoms or concerns once taking medications. Medications reviewed and compliance discussed.]                 SDOH assessments and interventions completed:  No     Care Coordination Interventions:  Yes, provided   Follow up plan: Follow up call scheduled for 06/30/22    Encounter Outcome:  Pt. Visit Completed  [name] Plowman RN,BSN,CCM [place] [phone] direct line

## 2022-05-11 NOTE — Patient Instructions (Signed)
Visit Information  Thank you for taking time to visit with me today. Please don't hesitate to contact me if I can be of assistance to you.   Following are the goals we discussed today:  Continue to monitor blood sugars daily Follow a low carbohydrate, diabetic diet Notify your provider for any new or ongoing symptoms. Keep follow up provider visits Notify provider office if you do not receive medication assistance application for Ozempic.   Our next appointment is by telephone on 06/30/22 at 2 pm  Please call the care guide team at [phone] if you need to cancel or reschedule your appointment.   If you are experiencing a Mental Health or [place] or need someone to talk to, please call the Suicide and Crisis Lifeline: 988 call 1-800-273-TALK (toll free, 24 hour hotline)  Patient verbalizes understanding of instructions and care plan provided today and agrees to view in [place]. Active MyChart status and patient understanding of how to access instructions and care plan via MyChart confirmed with patient.     Quinn Plowman RN,BSN,CCM [place] Coordination [phone] direct line

## 2022-05-18 ENCOUNTER — Encounter: Payer: Self-pay | Admitting: Family Medicine

## 2022-05-18 ENCOUNTER — Ambulatory Visit (INDEPENDENT_AMBULATORY_CARE_PROVIDER_SITE_OTHER): Payer: Medicare Other | Admitting: Family Medicine

## 2022-05-18 VITALS — BP 116/66 | HR 48 | Temp 98.2°F | Ht 73.0 in | Wt 210.4 lb

## 2022-05-18 DIAGNOSIS — E1122 Type 2 diabetes mellitus with diabetic chronic kidney disease: Secondary | ICD-10-CM

## 2022-05-18 DIAGNOSIS — I482 Chronic atrial fibrillation, unspecified: Secondary | ICD-10-CM

## 2022-05-18 DIAGNOSIS — M1990 Unspecified osteoarthritis, unspecified site: Secondary | ICD-10-CM | POA: Diagnosis not present

## 2022-05-18 DIAGNOSIS — N183 Chronic kidney disease, stage 3 unspecified: Secondary | ICD-10-CM

## 2022-05-18 DIAGNOSIS — R21 Rash and other nonspecific skin eruption: Secondary | ICD-10-CM | POA: Diagnosis not present

## 2022-05-18 DIAGNOSIS — J309 Allergic rhinitis, unspecified: Secondary | ICD-10-CM

## 2022-05-18 MED ORDER — AZELASTINE HCL 0.1 % NA SOLN: 2.0000 | Freq: Two times a day (BID) | NASAL | 2 refills | Status: AC

## 2022-05-18 NOTE — Assessment & Plan Note (Signed)
Chronic issue.  Discussed taking Tylenol 1000 mg 3 times a day as needed for pain.  If that is not beneficial he could try topical Voltaren over-the-counter.  If that is not helpful he will let us know.

## 2022-05-18 NOTE — Assessment & Plan Note (Signed)
Exam revealed he is in A-fib.  On exam his heart rate seem to be normal rate.  He will continue Eliquis 2.5 mg twice daily.

## 2022-05-18 NOTE — Progress Notes (Signed)
[name], MD Phone: [phone]  [name] [name] is a 89 y.o. male who presents today for f/u.  DIABETES Disease Monitoring: Blood Sugar ranges-110-155 fasting, though later in the day has sugars up to the 300s Polyuria/phagia/dipsia- no      Optho- due  Medications: Compliance- taking toujeo 25 u daily and farxiga Hypoglycemic symptoms- no Is following with endocrinology.  Patient reports they are working on patient assistance for [employer].  Allergic rhinitis: Patient sneezes a lot and has rhinorrhea and watery eyes.  No postnasal drip.  He is not taking any allergy medicines at this time.  Arthritis: Patient notes scattered joint pains and back pain.  These things are chronic.  He wonders if there is anything he can take to help with the pain.  At times he takes Tylenol 500 mg at a time with little benefit.  He tried tramadol in the past with no benefit.  He has never tried Voltaren gel.  Rash: Patient noted a rash on his right lateral lower leg.  This has been resolving.  It was itching.   Social History   Tobacco Use  Smoking Status Never  Smokeless Tobacco Never    Current Outpatient Medications on File Prior to Visit  Medication Sig Dispense Refill   amiodarone (PACERONE) 200 MG tablet Take 200 mg by mouth daily.     BD PEN NEEDLE NANO U/F 32G X 4 MM MISC USE AS DIRECTED 100 each 0   carvedilol (COREG) 12.5 MG tablet TAKE ONE (1) TABLET BY MOUTH TWO TIMES PER DAY (Patient taking differently: Take 12.5 mg by mouth 2 (two) times daily with a meal.) 60 tablet 0   Cholecalciferol (VITAMIN D3) 50 MCG (2000 UT) TABS Take 2,000 Units by mouth in the morning.     doxazosin (CARDURA) 2 MG tablet Take 1 tablet (2 mg total) by mouth 2 (two) times daily. Please keep upcoming appt for future refills 180 tablet 0   ezetimibe (ZETIA) 10 MG tablet TAKE 1 TABLET BY MOUTH DAILY 90 tablet 0   finasteride (PROSCAR) 5 MG tablet Take 1 tablet (5 mg total) by mouth daily. (Patient taking  differently: Take 5 mg by mouth every evening.) 90 tablet 1   furosemide (LASIX) 20 MG tablet Take 20 mg by mouth. Patient states he takes as needed depending on weight gain as recommended by provider     hydrALAZINE (APRESOLINE) 25 MG tablet Take 1 tablet (25 mg total) by mouth 3 (three) times daily. 270 tablet 3   insulin glargine, 1 Unit Dial, (TOUJEO) 300 UNIT/ML Solostar Pen Inject 40 Units into the skin daily. 4.5 mL 3   isosorbide mononitrate (IMDUR) 30 MG 24 hr tablet Take 1 tablet (30 mg total) by mouth daily. Please call [phone] for a sooner appointment and for further refills. 30 tablet 0   loperamide (IMODIUM A-D) 2 MG tablet Take 1 tablet (2 mg total) by mouth 3 (three) times daily as needed for diarrhea or loose stools. 30 tablet 0   Multiple Vitamins-Minerals (PRESERVISION AREDS 2 PO) Take 1 tablet by mouth in the morning and at bedtime.     omeprazole (PRILOSEC) 20 MG capsule Take 1 capsule (20 mg total) by mouth 2 (two) times daily. 60 capsule 1   ondansetron (ZOFRAN-ODT) 4 MG disintegrating tablet Take 1 tablet (4 mg total) by mouth every 8 (eight) hours as needed for nausea or vomiting. 20 tablet 0   potassium chloride (KLOR-CON) 10 MEQ tablet Take 1 tablet (10 mEq  total) by mouth daily as needed. Take with torsemide 90 tablet 3   TOUJEO SOLOSTAR 300 UNIT/ML Solostar Pen Inject 10 Units into the skin daily. 22.5 mL 6   apixaban (ELIQUIS) 2.5 MG TABS tablet Take 1 tablet (2.5 mg total) by mouth 2 (two) times daily. 120 tablet 0   No current facility-administered medications on file prior to visit.     ROS see history of present illness  Objective  Physical Exam Vitals:   05/18/22 1213  BP: 116/66  Pulse: (!) 48  Temp: 98.2 F (36.8 C)  SpO2: 98%    BP Readings from Last 3 Encounters:  05/18/22 116/66  04/13/22 130/60  03/31/22 (!) 138/58   Wt Readings from Last 3 Encounters:  05/18/22 210 lb 6.4 oz (95.4 kg)  04/13/22 207 lb 12.8 oz (94.3 kg)  03/31/22  213 lb 6.4 oz (96.8 kg)    Physical Exam Constitutional:      General: He is not in acute distress.    Appearance: He is not diaphoretic.  Cardiovascular:     Rate and Rhythm: Normal rate. Rhythm irregularly irregular.     Heart sounds: Normal heart sounds.  Pulmonary:     Effort: Pulmonary effort is normal.     Breath sounds: Normal breath sounds.  Skin:    General: Skin is warm and dry.     Comments: Scabbing rash lateral lower leg  Neurological:     Mental Status: He is alert.      Assessment/Plan: Please see individual problem list.  Atrial fibrillation, chronic ([place]) Assessment & Plan: Exam revealed he is in A-fib.  On exam his heart rate seem to be normal rate.  He will continue Eliquis 2.5 mg twice daily.   Type 2 diabetes mellitus with stage 3 chronic kidney disease, without long-term current use of insulin, unspecified whether stage 3a or 3b CKD ([place]) Assessment & Plan: Chronic issue.  Seems to have improving control with improved fasting sugars.  He will continue Toujeo 25 units daily and Farxiga 10 mg daily.  He will continue to work with endocrinology on getting [place].   Rash Assessment & Plan: Undetermined cause.  Currently resolving.  He will monitor and if it recurs he will let us know.   Chronic arthritis Assessment & Plan: Chronic issue.  Discussed taking Tylenol 1000 mg 3 times a day as needed for pain.  If that is not beneficial he could try topical Voltaren over-the-counter.  If that is not helpful he will let us know.   Allergic rhinitis, unspecified seasonality, unspecified trigger Assessment & Plan: Symptoms are consistent with allergic rhinitis.  We will trial Astelin nasal spray 2 sprays each nostril twice daily to see if that is beneficial.   Other orders -     Azelastine HCl; Place 2 sprays into both nostrils 2 (two) times daily. Use in each nostril as directed  Dispense: 30 mL; Refill: 2     Return in about 3 months (around  08/18/2022).   [name], MD [place]

## 2022-05-18 NOTE — Assessment & Plan Note (Signed)
Undetermined cause.  Currently resolving.  He will monitor and if it recurs he will let us know.

## 2022-05-18 NOTE — Assessment & Plan Note (Signed)
Symptoms are consistent with allergic rhinitis.  We will trial Astelin nasal spray 2 sprays each nostril twice daily to see if that is beneficial.

## 2022-05-18 NOTE — Assessment & Plan Note (Signed)
Chronic issue.  Seems to have improving control with improved fasting sugars.  He will continue Toujeo 25 units daily and Farxiga 10 mg daily.  He will continue to work with endocrinology on getting [place].

## 2022-06-02 ENCOUNTER — Other Ambulatory Visit: Payer: Self-pay | Admitting: Family Medicine

## 2022-06-03 DIAGNOSIS — E1165 Type 2 diabetes mellitus with hyperglycemia: Secondary | ICD-10-CM | POA: Diagnosis not present

## 2022-06-16 ENCOUNTER — Telehealth: Payer: Self-pay

## 2022-06-16 NOTE — Telephone Encounter (Signed)
Spoke to Patient to let him know we received 3 boxes of Toujeo 300 units from Patient assistance for him

## 2022-06-16 NOTE — Telephone Encounter (Signed)
Medication Samples have been provided to the patient.  Drug name: Toujeo       Strength: 300 units/mL        Qty: 3 Boxes  LOT: [id number]  Exp.Date: 03/06/2024  Dosing instructions: Inject 40 units into the skin daily  The patient has been instructed regarding the correct time, dose, and frequency of taking this medication, including desired effects and most common side effects.   Francene Boyers 4:50 PM 06/16/2022

## 2022-06-30 ENCOUNTER — Ambulatory Visit: Payer: Self-pay

## 2022-06-30 NOTE — Patient Outreach (Signed)
  Care Coordination   Follow Up Visit Note   06/30/2022 Name: HAZEL WRINKLE MRN: [id number] DOB: Aug 04, 1933  AZELL BILL is a 89 y.o. year old male who sees [name], [name], MD for primary care. I spoke with  [name] by phone today.  What matters to the patients health and wellness today?  Patient states he has not received Ozempic as of yet. He states he has submitted medication assistance paperwork for Ozempic and recently received letter stating he was denied due to a missing piece of information.  Patient states he will resubmit with requested information. Patient states the Marcelline Deist gives him a yeast infection. He reports being prescribed a cream based medicine to apply for flare up.  Patient reports having SOB with exertion and some LE swelling.  He states he is takes his fluid pill and potassium for management of these symptoms as recommended by his provider. He reports provider is aware. He reports current weight of 215 lbs.  Patient reports blood sugar ranges from 120-150 with higher blood sugars in the evenings of 200's to 300. He states he is no sure the long acting insulin is covering his blood sugar during the evening / night time frame.     Goals Addressed             This Visit's Progress    Development of plan of care for management of diabetes and new onset shortness of breath/ health conditions       Interventions Today    Flowsheet Row Most Recent Value  Chronic Disease   Chronic disease during today's visit Diabetes, Congestive Heart Failure (CHF), Atrial Fibrillation (AFib)  General Interventions   General Interventions Discussed/Reviewed General Interventions Reviewed, Doctor Visits  [Evaluation of current treatment plan for DM, HF, A-fb and patients adherence to plan as established by provider.  Assessed for blood sugar readings. Advised to notfy provider of elevated evening blood sugars. Assessed for heart failure symptoms.]  Doctor Visits  Discussed/Reviewed Doctor Visits Reviewed  [Reviewed scheduled/ upcoming provider visits. Advised to all and confirm follow up appoitnment date for endocrinology appointment.]  Education Interventions   Education Provided Provided Education  [Discussed heart failure/ A-fib action plan.]  Pharmacy Interventions   Pharmacy Dicussed/Reviewed Pharmacy Topics Reviewed  [medications reviewed and compliance discussed.]                 SDOH assessments and interventions completed:  No     Care Coordination Interventions:  Yes, provided   Follow up plan: Follow up call scheduled for 08/04/22    Encounter Outcome:  Pt. Visit Completed   George Ina RN,BSN,CCM [facility] Care Coordination [phone] direct line

## 2022-07-04 ENCOUNTER — Telehealth: Payer: Self-pay

## 2022-07-04 DIAGNOSIS — E1165 Type 2 diabetes mellitus with hyperglycemia: Secondary | ICD-10-CM | POA: Diagnosis not present

## 2022-07-04 NOTE — Telephone Encounter (Signed)
Pt came in to pick up Pt assistance medication Insulin 3 boxes on 07/04/22 @ 1:40 pm

## 2022-07-11 DIAGNOSIS — E1159 Type 2 diabetes mellitus with other circulatory complications: Secondary | ICD-10-CM | POA: Diagnosis not present

## 2022-07-11 DIAGNOSIS — E1122 Type 2 diabetes mellitus with diabetic chronic kidney disease: Secondary | ICD-10-CM | POA: Diagnosis not present

## 2022-07-11 DIAGNOSIS — Z794 Long term (current) use of insulin: Secondary | ICD-10-CM | POA: Diagnosis not present

## 2022-07-11 DIAGNOSIS — N1831 Chronic kidney disease, stage 3a: Secondary | ICD-10-CM | POA: Diagnosis not present

## 2022-07-11 DIAGNOSIS — E1169 Type 2 diabetes mellitus with other specified complication: Secondary | ICD-10-CM | POA: Diagnosis not present

## 2022-07-11 DIAGNOSIS — E785 Hyperlipidemia, unspecified: Secondary | ICD-10-CM | POA: Diagnosis not present

## 2022-07-12 DIAGNOSIS — Z951 Presence of aortocoronary bypass graft: Secondary | ICD-10-CM | POA: Diagnosis not present

## 2022-07-12 DIAGNOSIS — I130 Hypertensive heart and chronic kidney disease with heart failure and stage 1 through stage 4 chronic kidney disease, or unspecified chronic kidney disease: Secondary | ICD-10-CM | POA: Diagnosis not present

## 2022-07-12 DIAGNOSIS — N183 Chronic kidney disease, stage 3 unspecified: Secondary | ICD-10-CM | POA: Diagnosis not present

## 2022-07-12 DIAGNOSIS — I5030 Unspecified diastolic (congestive) heart failure: Secondary | ICD-10-CM | POA: Diagnosis not present

## 2022-07-12 DIAGNOSIS — E785 Hyperlipidemia, unspecified: Secondary | ICD-10-CM | POA: Diagnosis not present

## 2022-07-12 DIAGNOSIS — E1122 Type 2 diabetes mellitus with diabetic chronic kidney disease: Secondary | ICD-10-CM | POA: Diagnosis not present

## 2022-07-12 DIAGNOSIS — I4892 Unspecified atrial flutter: Secondary | ICD-10-CM | POA: Diagnosis not present

## 2022-07-12 DIAGNOSIS — I2510 Atherosclerotic heart disease of native coronary artery without angina pectoris: Secondary | ICD-10-CM | POA: Diagnosis not present

## 2022-07-12 DIAGNOSIS — I480 Paroxysmal atrial fibrillation: Secondary | ICD-10-CM | POA: Diagnosis not present

## 2022-07-12 DIAGNOSIS — Z7901 Long term (current) use of anticoagulants: Secondary | ICD-10-CM | POA: Diagnosis not present

## 2022-07-15 ENCOUNTER — Telehealth: Payer: Self-pay

## 2022-07-15 NOTE — Telephone Encounter (Signed)
FYI-Next OV on 6/14, would you like to see him sooner?

## 2022-07-15 NOTE — Telephone Encounter (Signed)
[name] [name] from [facility] also stated that patient is not taking his medications properly.

## 2022-07-15 NOTE — Telephone Encounter (Signed)
Tresa Endo called from [facility] to state they would like to have our assistance in helping patient with his patient assistance paperwork for his eliquis.  Tresa Endo states they have sent related paperwork to patient multiple times and he can't seem to get it back to them.  [employer], from their office, is going to fill out the paperwork for the patient and fax it to [place].  They would like for [place] to please call the patient and have him come in to sign the paperwork and then we can fax it back to them.  Tresa Endo states since they are in [place] and patient lives in [place], this will be easier for him.  Fleet Contras from [facility] will be our contact and her phone number is [phone].

## 2022-07-16 NOTE — Telephone Encounter (Signed)
See below, pt needs a sooner appt. Routed to admin pool [employer] pool.

## 2022-07-18 ENCOUNTER — Other Ambulatory Visit: Payer: Self-pay | Admitting: Family Medicine

## 2022-07-18 NOTE — Telephone Encounter (Signed)
Patient called office back and appt was made.

## 2022-07-19 ENCOUNTER — Ambulatory Visit (INDEPENDENT_AMBULATORY_CARE_PROVIDER_SITE_OTHER): Payer: Medicare Other

## 2022-07-19 VITALS — Wt 210.0 lb

## 2022-07-19 DIAGNOSIS — Z0000 Encounter for general adult medical examination without abnormal findings: Secondary | ICD-10-CM | POA: Diagnosis not present

## 2022-07-19 NOTE — Progress Notes (Addendum)
Subjective:   [name] [name] is a 89 y.o. male who presents for Medicare Annual/Subsequent preventive examination.  Review of Systems    I connected with  [name] on 07/19/22 by a audio enabled telemedicine application and verified that I am speaking with the correct person using two identifiers.  Patient Location: Home  Provider Location: Home Office  I discussed the limitations of evaluation and management by telemedicine. The patient expressed understanding and agreed to proceed.  Cardiac Risk Factors include: advanced age (>2men, >81 women);hypertension;diabetes mellitus     Objective:    Today's Vitals   07/19/22 1354 07/19/22 1355  Weight: 210 lb (95.3 kg)   PainSc:  5    Body mass index is 27.71 kg/m.     07/19/2022    2:03 PM 03/22/2022   10:24 AM 03/14/2022   10:00 AM 01/26/2022    1:00 PM 01/26/2022   12:17 PM 01/26/2022   11:55 AM 01/17/2022    6:49 PM  Advanced Directives  Does Patient Have a Medical Advance Directive? Yes No No Yes Yes No No  Type of Estate agent of [place];Living will   Living will     Does patient want to make changes to medical advance directive?     No - Patient declined    Copy of Healthcare Power of Attorney in Chart? No - copy requested        Would patient like information on creating a medical advance directive?  No - Patient declined No - Patient declined        Current Medications (verified) Outpatient Encounter Medications as of 07/19/2022  Medication Sig   amiodarone (PACERONE) 200 MG tablet Take 200 mg by mouth daily.   azelastine (ASTELIN) 0.1 % nasal spray Place 2 sprays into both nostrils 2 (two) times daily. Use in each nostril as directed   BD PEN NEEDLE NANO U/F 32G X 4 MM MISC USE AS DIRECTED   carvedilol (COREG) 12.5 MG tablet TAKE ONE (1) TABLET BY MOUTH TWO TIMES PER DAY (Patient taking differently: Take 12.5 mg by mouth 2 (two) times daily with a meal.)   Cholecalciferol (VITAMIN  D3) 50 MCG (2000 UT) TABS Take 2,000 Units by mouth in the morning.   clotrimazole (LOTRIMIN) 1 % cream Apply 1 Application topically 2 (two) times daily.   dapagliflozin propanediol (FARXIGA) 10 MG TABS tablet Take by mouth.   doxazosin (CARDURA) 2 MG tablet Take 1 tablet (2 mg total) by mouth 2 (two) times daily. Please keep upcoming appt for future refills   ezetimibe (ZETIA) 10 MG tablet TAKE 1 TABLET BY MOUTH DAILY   finasteride (PROSCAR) 5 MG tablet Take 1 tablet (5 mg total) by mouth daily. (Patient taking differently: Take 5 mg by mouth every evening.)   furosemide (LASIX) 20 MG tablet Take 20 mg by mouth. Patient states he takes as needed depending on weight gain as recommended by provider   gabapentin (NEURONTIN) 100 MG capsule Take 100 mg by mouth 3 (three) times daily.   HUMALOG KWIKPEN 100 UNIT/ML KwikPen Inject into the skin.   hydrALAZINE (APRESOLINE) 25 MG tablet Take 1 tablet (25 mg total) by mouth 3 (three) times daily.   insulin glargine, 1 Unit Dial, (TOUJEO) 300 UNIT/ML Solostar Pen Inject 40 Units into the skin daily.   isosorbide mononitrate (IMDUR) 30 MG 24 hr tablet Take 1 tablet (30 mg total) by mouth daily. Please call [phone] for a sooner appointment and for further  refills.   loperamide (IMODIUM A-D) 2 MG tablet Take 1 tablet (2 mg total) by mouth 3 (three) times daily as needed for diarrhea or loose stools.   Multiple Vitamins-Minerals (PRESERVISION AREDS 2 PO) Take 1 tablet by mouth in the morning and at bedtime.   omeprazole (PRILOSEC) 20 MG capsule TAKE 1 CAPSULE BY MOUTH TWO TIMES DAILY   ondansetron (ZOFRAN-ODT) 4 MG disintegrating tablet Take 1 tablet (4 mg total) by mouth every 8 (eight) hours as needed for nausea or vomiting.   potassium chloride (KLOR-CON) 10 MEQ tablet Take 1 tablet (10 mEq total) by mouth daily as needed. Take with torsemide   TOUJEO SOLOSTAR 300 UNIT/ML Solostar Pen Inject 10 Units into the skin daily.   apixaban (ELIQUIS) 2.5 MG TABS  tablet Take 1 tablet (2.5 mg total) by mouth 2 (two) times daily.   No facility-administered encounter medications on file as of 07/19/2022.    Allergies (verified) Cortisone and Metformin   History: Past Medical History:  Diagnosis Date   (HFpEF) heart failure with preserved ejection fraction (HCC)    a. 06/2007 Echo: nl systolic fxn, mild LVH, diastolic relaxation abnormality, mildly enlarged LA, mild Ao insufficiency; 01/2018 Echo: EF 55-60%, no rwma, GR2 DD, triv AI, mildly dil LA, mild inc PASP.   AMS (altered mental status) 02/03/2021   BPH (benign prostatic hyperplasia)    Cancer (HCC)    skin   Cataract    Choledocholithiasis    a. 05/2020 ERCP w/ CBD stone removal and biliary sphincterotomy.   Coronary artery disease    a. 01/2005 s/p CABG x 4: LIMA-LAD, VG-Diag, VG-OM3, VG-dRCA; b. 06/2013 Cath: patent grafts; c. 09/2015 Cath: VG->Diag 100, other grafts patent. LAD 100, RCA sev dzs->Med rx; d. 01/2018 Cath: LAD 100ost/p, LCX 50, RCA 100p/d, VG->OM3 100, VG->Diag 100, LIMA->LAD ok, VG->RPAV 62m-->Med rx.   COVID-19    07/2020 had paxlovid    Dyspnea    with exertion   Dysrhythmia    ED (erectile dysfunction)    Elevated LFTs    a. 05/2020 following CBD stone. 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   GERD (gastroesophageal reflux disease)    Hyperlipidemia    Hypertension    IDDM (insulin dependent diabetes mellitus) 2000   Osteoarthritis    PAH (pulmonary artery hypertension) (HCC)    a. 01/2018 RHC: PA 51/22 (32).   Perirectal fistula    Pneumonia 12/2016   Shingles 09/04/2018   Tubular adenoma of colon 07/2012   Vertigo    Past Surgical History:  Procedure Laterality Date   BACK SURGERY     CARDIAC CATHETERIZATION  06/05/2013   [facility].    CARDIAC CATHETERIZATION N/A 09/24/2015   Procedure: Right Heart Cath and Coronary/Graft Angiography;  Surgeon: Antonieta Iba, MD;  Location: [facility] INVASIVE CV LAB;  Service:  Cardiovascular;  Laterality: N/A;   CATARACT EXTRACTION  sept 2013   right   CATARACT EXTRACTION W/PHACO Left 03/28/2017   Procedure: CATARACT EXTRACTION PHACO AND INTRAOCULAR LENS PLACEMENT (IOC);  Surgeon: Galen Manila, MD;  Location: [facility] ORS;  Service: Ophthalmology;  Laterality: Left;  [place] 00:42.0 AP% 15.0 CDE 6.31 Fluid Pack lot # [id number]   CHOLECYSTECTOMY  05/15/2011   Procedure: LAPAROSCOPIC CHOLECYSTECTOMY;  Surgeon: Emelia Loron, MD;  Location: [facility] OR;  Service: General;  Laterality: N/A;   COLONOSCOPY W/ POLYPECTOMY     CORONARY ARTERY BYPASS GRAFT  2007   x 4   CORONARY STENT  INTERVENTION N/A 12/08/2020   Procedure: CORONARY STENT INTERVENTION;  Surgeon: Yvonne Kendall, MD;  Location: [facility]VAS[facility] CV LAB;  Service: Cardiovascular;  Laterality: N/A;   ERCP N/A 05/05/2020   Procedure: ENDOSCOPIC RETROGRADE CHOLANGIOPANCREATOGRAPHY (ERCP);  Surgeon: Midge Minium, MD;  Location: [facility] ENDOSCOPY;  Service: Endoscopy;  Laterality: N/A;   ERCP N/A 05/12/2020   Procedure: ENDOSCOPIC RETROGRADE CHOLANGIOPANCREATOGRAPHY (ERCP);  Surgeon: Midge Minium, MD;  Location: [facility] ENDOSCOPY;  Service: Endoscopy;  Laterality: N/A;   ESOPHAGOGASTRODUODENOSCOPY N/A 05/12/2020   Procedure: ESOPHAGOGASTRODUODENOSCOPY (EGD);  Surgeon: Midge Minium, MD;  Location: [facility] ENDOSCOPY;  Service: Endoscopy;  Laterality: N/A;   EYE SURGERY     FOOT SURGERY  2012   right foot   JOINT REPLACEMENT  8/10   Right THR--[name]   LEFT AND RIGHT HEART CATHETERIZATION WITH CORONARY/GRAFT ANGIOGRAM N/A 06/05/2013   Procedure: LEFT AND RIGHT HEART CATHETERIZATION WITH Isabel Caprice;  Surgeon: Peter M Swaziland, MD;  Location[facility] C[facility] LAB;  Service: Cardiovascular;  Laterality: N/A;   LUMBAR LAMINECTOMY  1989   Prostate photovaporization  5/16   Dr Raeanne Gathers   RIGHT/LEFT HEART CATH AND CORONARY/GRAFT ANGIOGRAPHY N/A 01/05/2018   Procedure: RIGHT/LEFT HEART CATH AND CORONARY/GRAFT ANGIOGRAPHY;  Surgeon:  Antonieta Iba, MD;  Location: [facility] INVASIVE CV LAB;  Service: Cardiovascular;  Laterality: N/A;   RIGHT/LEFT HEART CATH AND CORONARY/GRAFT ANGIOGRAPHY Bilateral 12/08/2020   Procedure: RIGHT/LEFT HEART CATH AND CORONARY/GRAFT ANGIOGRAPHY;  Surgeon: Yvonne Kendall, MD;  Location: [facility] INVASIVE CV LAB;  Service: Cardiovascular;  Laterality: Bilateral;   Family History  Problem Relation Age of Onset   Lung cancer Mother    Heart disease Father    Colon cancer Neg Hx    Breast cancer Neg Hx    Social History   Socioeconomic History   Marital status: Married    Spouse name: Jan   Number of children: 4   Years of education: Not on file   Highest education level: Not on file  Occupational History   Occupation: Warden/ranger    Comment: Animator  Tobacco Use   Smoking status: Never   Smokeless tobacco: Never  Vaping Use   Vaping Use: Never used  Substance and Sexual Activity   Alcohol use: Yes    Comment: occ cocktail   Drug use: No   Sexual activity: Not Currently  Other Topics Concern   Not on file  Social History Narrative   Goes by [place]   Has living will    Has designated [employer] as health care POA   Would accept resuscitation attempts   Would not want tube feeds if cognitively unaware   Social Determinants of Health   Financial Resource Strain: Low Risk  (07/19/2022)   Overall Financial Resource Strain (CARDIA)    Difficulty of Paying Living Expenses: Not hard at all  Food Insecurity: No Food Insecurity (07/19/2022)   Hunger Vital Sign    Worried About Running Out of Food in the Last Year: Never true    Ran Out of Food in the Last Year: Never true  Transportation Needs: No Transportation Needs (07/19/2022)   PRAPARE - Administrator, Civil Service (Medical): No    Lack of Transportation (Non-Medical): No  Physical Activity: Insufficiently Active (07/19/2022)   Exercise Vital Sign    Days of Exercise per Week: 7  days    Minutes of Exercise per Session: 20 min  Stress: No Stress Concern Present (07/19/2022)   [employer] of Occupational Health -  Occupational Stress Questionnaire    Feeling of Stress : Not at all  Social Connections: Patient Unable To Answer (07/19/2022)   Social Connection and Isolation Panel [NHANES]    Frequency of Communication with Friends and Family: Patient unable to answer    Frequency of Social Gatherings with Friends and Family: Patient unable to answer    Attends Religious Services: Patient unable to answer    Active Member of Clubs or Organizations: Patient unable to answer    Attends Banker Meetings: Patient unable to answer    Marital Status: Patient unable to answer    Tobacco Counseling Counseling given: Yes   Clinical Intake:  Pre-visit preparation completed: Yes  Pain : 0-10 Pain Score: 5  Pain Type: Acute pain Pain Location: Knee Pain Descriptors / Indicators: Aching Pain Onset: 1 to 4 weeks ago Pain Frequency: Occasional     BMI - recorded: 27.71 Nutritional Risks: None Diabetes: Yes CBG done?: No Did pt. bring in CBG monitor from home?: No  How often do you need to have someone help you when you read instructions, pamphlets, or other written materials from your doctor or pharmacy?: 1 - Never  Diabetic?yes  Interpreter Needed?: No  Information entered by :: Fredirick Maudlin   Activities of Daily Living    07/19/2022    2:05 PM 03/14/2022   10:00 AM  In your present state of health, do you have any difficulty performing the following activities:  Hearing? 0 0  Vision? 0 0  Difficulty concentrating or making decisions? 0 0  Walking or climbing stairs? 0 0  Dressing or bathing? 0 0  Doing errands, shopping? 0 0  Preparing Food and eating ? N   Using the Toilet? N   In the past six months, have you accidently leaked urine? N   Do you have problems with loss of bowel control? N   Managing your Medications? N    Managing your Finances? N   Housekeeping or managing your Housekeeping? N     Patient Care Team: Glori Luis, MD as PCP - General (Family Medicine) Mariah Milling Tollie Pizza, MD as Consulting Physician (Cardiology)  Indicate any recent Medical Services you may have received from other than [facility] providers in the past year (date may be approximate).     Assessment:   This is a routine wellness examination for Jonithan.  Hearing/Vision screen Hearing Screening - Comments:: Denies hearing difficulties   Vision Screening - Comments:: Wears rx glasses - up to date with routine eye exams with  [facility]   Dietary issues and exercise activities discussed: Current Exercise Habits: Home exercise routine, Time (Minutes): 40, Frequency (Times/Week): 4, Weekly Exercise (Minutes/Week): 160   Goals Addressed             This Visit's Progress    Development of plan of care for management of diabetes and new onset shortness of breath/ health conditions   On track    Interventions Today    Flowsheet Row Most Recent Value  Chronic Disease   Chronic disease during today's visit Diabetes, Congestive Heart Failure (CHF), Atrial Fibrillation (AFib)  General Interventions   General Interventions Discussed/Reviewed General Interventions Reviewed, Doctor Visits  [Evaluation of current treatment plan for DM, HF, A-fb and patients adherence to plan as established by provider.  Assessed for blood sugar readings. Advised to notfy provider of elevated evening blood sugars. Assessed for heart failure symptoms.]  Doctor Visits Discussed/Reviewed Doctor Visits Reviewed  [facility] scheduled/  upcoming provider visits. Advised to all and confirm follow up appoitnment date for endocrinology appointment.]  Education Interventions   Education Provided Provided Education  [Discussed heart failure/ A-fib action plan.]  Pharmacy Interventions   Pharmacy Dicussed/Reviewed Pharmacy Topics Reviewed  [medications  reviewed and compliance discussed.]               Depression Screen    07/19/2022    2:05 PM 05/18/2022   12:14 PM 03/31/2022    4:10 PM 02/04/2022   11:19 AM 10/06/2021   10:57 AM 07/07/2021    2:29 PM 06/04/2021    2:04 PM  PHQ 2/9 Scores  PHQ - 2 Score 0 0 1 0 0 0 0  PHQ- 9 Score  0         Fall Risk    07/19/2022    2:04 PM 05/18/2022   12:14 PM 03/31/2022    4:10 PM 02/04/2022   11:19 AM 10/06/2021   10:57 AM  Fall Risk   Falls in the past year? 0 0 1 1 1   Number falls in past yr: 0 0 1 0 0  Injury with Fall? 0 0 1 0 1  Risk for fall due to : No Fall Risks No Fall Risks History of fall(s) History of fall(s)   Follow up Falls prevention discussed;Falls evaluation completed Falls evaluation completed Falls evaluation completed Falls evaluation completed     FALL RISK PREVENTION PERTAINING TO THE HOME:  Any stairs in or around the home? No  If so, are there any without handrails? No  Home free of loose throw rugs in walkways, pet beds, electrical cords, etc? No  Adequate lighting in your home to reduce risk of falls? Yes   ASSISTIVE DEVICES UTILIZED TO PREVENT FALLS:  Life alert? No  Use of a cane, walker or w/c? Yes  Grab bars in the bathroom? Yes  Shower chair or bench in shower? Yes  Elevated toilet seat or a handicapped toilet? No   TIMED UP AND GO:  Was the test performed?  NO televisit .    Cognitive Function:        07/19/2022    2:00 PM  6CIT Screen  What Year? 0 points  What month? 0 points  What time? 0 points  Count back from 20 0 points  Months in reverse 0 points  Repeat phrase 0 points  Total Score 0 points    Immunizations Immunization History  Administered Date(s) Administered   Influenza Split 12/24/2010   Influenza Whole 12/05/2012   Influenza, High Dose Seasonal PF 01/02/2015, 11/09/2016, 12/14/2017, 12/19/2018   Influenza, Seasonal, Injecte, Preservative Fre 12/11/2007   Influenza-Unspecified 12/27/2013, 01/02/2015, 12/24/2019    PFIZER(Purple Top)SARS-COV-2 Vaccination 03/28/2019, 04/18/2019, 12/16/2019   Pneumococcal Conjugate-13 02/05/2014, 01/02/2015   Pneumococcal Polysaccharide-23 03/07/2010   Td 06/13/2012, 09/02/2021   Zoster Recombinat (Shingrix) 11/13/2017, 03/04/2019   Zoster, Live 12/01/2010    TDAP status: Up to date  Flu Vaccine status: Due, Education has been provided regarding the importance of this vaccine. Advised may receive this vaccine at local pharmacy or Health Dept. Aware to provide a copy of the vaccination record if obtained from local pharmacy or Health Dept. Verbalized acceptance and understanding.  Pneumococcal vaccine status: Up to date  Covid-19 vaccine status: Information provided on how to obtain vaccines.   Qualifies for Shingles Vaccine? Yes   Zostavax completed Yes   Shingrix Completed?: Yes  Screening Tests Health Maintenance  Topic Date Due   FOOT EXAM  08/27/2021   OPHTHALMOLOGY EXAM  04/21/2022   COVID-19 Vaccine (4 - 2023-24 season) 07/20/2023 (Originally 11/05/2021)   INFLUENZA VACCINE  10/06/2022   HEMOGLOBIN A1C  10/10/2022   Medicare Annual Wellness (AWV)  07/19/2023   DTaP/Tdap/Td (3 - Tdap) 09/03/2031   Pneumonia Vaccine 76+ Years old  Completed   Zoster Vaccines- Shingrix  Completed   HPV VACCINES  Aged Out    Health Maintenance  Health Maintenance Due  Topic Date Due   FOOT EXAM  08/27/2021   OPHTHALMOLOGY EXAM  04/21/2022    Colorectal cancer screening: No longer required.   Lung Cancer Screening: (Low Dose CT Chest recommended if Age 14-80 years, 30 pack-year currently smoking OR have quit w/in 15years.) does not qualify.    Additional Screening:  Hepatitis C Screening: does not qualify; due to age  Vision Screening: Recommended annual ophthalmology exams for early detection of glaucoma and other disorders of the eye. Is the patient up to date with their annual eye exam?  Yes  Who is the provider or what is the name of the office in  which the patient attends annual eye exams? [facility] If pt is not established with a provider, would they like to be referred to a provider to establish care? No .   Dental Screening: Recommended annual dental exams for proper oral hygiene  Community Resource Referral / Chronic Care Management: CRR required this visit?  No   CCM required this visit?  No      Plan:     I have personally reviewed and noted the following in the patient's chart:   Medical and social history Use of alcohol, tobacco or illicit drugs  Current medications and supplements including opioid prescriptions. Patient is not currently taking opioid prescriptions. Functional ability and status Nutritional status Physical activity Advanced directives List of other physicians Hospitalizations, surgeries, and ER visits in previous 12 months Vitals Screenings to include cognitive, depression, and falls Referrals and appointments  In addition, I have reviewed and discussed with patient certain preventive protocols, quality metrics, and best practice recommendations. A written personalized care plan for preventive services as well as general preventive health recommendations were provided to patient.     Annabell Sabal, CMA   07/19/2022   Nurse Notes: none   I have reviewed the above information and agree with above.   Duncan Dull, MD

## 2022-07-19 NOTE — Telephone Encounter (Signed)
Pt came in to sign paperwork. Pt filled the highlighted parts. Paperwork placed on provider color folder.

## 2022-07-19 NOTE — Telephone Encounter (Signed)
Fax was received from [facility] with [employer] Patient [name] form.  I spoke with patient and he states he will come by today to sign the form.  Once form has been signed, we need to fax it back to [facility] at [phone] as soon as possible.

## 2022-07-19 NOTE — Patient Instructions (Addendum)
[name] [name] , Thank you for taking time to come for your Medicare Wellness Visit. I appreciate your ongoing commitment to your health goals. Please review the following plan we discussed and let me know if I can assist you in the future.   These are the goals we discussed:  Goals      Development of plan of care for management of diabetes and new onset shortness of breath/ health conditions     Interventions Today    Flowsheet Row Most Recent Value  Chronic Disease   Chronic disease during today's visit Diabetes, Congestive Heart Failure (CHF), Atrial Fibrillation (AFib)  General Interventions   General Interventions Discussed/Reviewed General Interventions Reviewed, Doctor Visits  [Evaluation of current treatment plan for DM, HF, A-fb and patients adherence to plan as established by provider.  Assessed for blood sugar readings. Advised to notfy provider of elevated evening blood sugars. Assessed for heart failure symptoms.]  Doctor Visits Discussed/Reviewed Doctor Visits Reviewed  [Reviewed scheduled/ upcoming provider visits. Advised to all and confirm follow up appoitnment date for endocrinology appointment.]  Education Interventions   Education Provided Provided Education  [Discussed heart failure/ A-fib action plan.]  Pharmacy Interventions   Pharmacy Dicussed/Reviewed Pharmacy Topics Reviewed  [medications reviewed and compliance discussed.]              Maintain Healthy Lifestyle     Weight goal less than 200lb Healthy diet Stay active         This is a list of the screening recommended for you and due dates:  Health Maintenance  Topic Date Due   Complete foot exam   08/27/2021   Eye exam for diabetics  04/21/2022   COVID-19 Vaccine (4 - 2023-24 season) 07/20/2023*   Flu Shot  10/06/2022   Hemoglobin A1C  10/10/2022   Medicare Annual Wellness Visit  07/19/2023   DTaP/Tdap/Td vaccine (3 - Tdap) 09/03/2031   Pneumonia Vaccine  Completed   Zoster (Shingles) Vaccine   Completed   HPV Vaccine  Aged Out  *Topic was postponed. The date shown is not the original due date.    Advanced directives: Please bring a copy of your health care power of attorney and living will to the office to be added to your chart at your convenience.   Conditions/risks identified: Diabetes Mellitus and Nutrition, Adult When you have diabetes, or diabetes mellitus, it is very important to have healthy eating habits because your blood sugar (glucose) levels are greatly affected by what you eat and drink. Eating healthy foods in the right amounts, at about the same times every day, can help you: Manage your blood glucose. Lower your risk of heart disease. Improve your blood pressure. Reach or maintain a healthy weight. What can affect my meal plan? Every person with diabetes is different, and each person has different needs for a meal plan. Your health care provider may recommend that you work with a dietitian to make a meal plan that is best for you. Your meal plan may vary depending on factors such as: The calories you need. The medicines you take. Your weight. Your blood glucose, blood pressure, and cholesterol levels. Your activity level. Other health conditions you have, such as heart or kidney disease. How do carbohydrates affect me? Carbohydrates, also called carbs, affect your blood glucose level more than any other type of food. Eating carbs raises the amount of glucose in your blood. It is important to know how many carbs you can safely have in each meal.  This is different for every person. Your dietitian can help you calculate how many carbs you should have at each meal and for each snack. How does alcohol affect me? Alcohol can cause a decrease in blood glucose (hypoglycemia), especially if you use insulin or take certain diabetes medicines by mouth. Hypoglycemia can be a life-threatening condition. Symptoms of hypoglycemia, such as sleepiness, dizziness, and confusion,  are similar to symptoms of having too much alcohol. Do not drink alcohol if: Your health care provider tells you not to drink. You are pregnant, may be pregnant, or are planning to become pregnant. If you drink alcohol: Limit how much you have to: 0-1 drink a day for women. 0-2 drinks a day for men. Know how much alcohol is in your drink. In the [place] one drink equals one 12 oz bottle of beer (355 mL), one 5 oz glass of wine (148 mL), or one 1 oz glass of hard liquor (44 mL). Keep yourself hydrated with water, diet soda, or unsweetened iced tea. Keep in mind that regular soda, juice, and other mixers may contain a lot of sugar and must be counted as carbs. What are tips for following this plan?  Reading food labels Start by checking the serving size on the Nutrition Facts label of packaged foods and drinks. The number of calories and the amount of carbs, fats, and other nutrients listed on the label are based on one serving of the item. Many items contain more than one serving per package. Check the total grams (g) of carbs in one serving. Check the number of grams of saturated fats and trans fats in one serving. Choose foods that have a low amount or none of these fats. Check the number of milligrams (mg) of salt (sodium) in one serving. Most people should limit total sodium intake to less than 2,300 mg per day. Always check the nutrition information of foods labeled as "low-fat" or "nonfat." These foods may be higher in added sugar or refined carbs and should be avoided. Talk to your dietitian to identify your daily goals for nutrients listed on the label. Shopping Avoid buying canned, pre-made, or processed foods. These foods tend to be high in fat, sodium, and added sugar. Shop around the outside edge of the grocery store. This is where you will most often find fresh fruits and vegetables, bulk grains, fresh meats, and fresh dairy products. Cooking Use low-heat cooking methods, such as  baking, instead of high-heat cooking methods, such as deep frying. Cook using healthy oils, such as olive, canola, or sunflower oil. Avoid cooking with butter, cream, or high-fat meats. Meal planning Eat meals and snacks regularly, preferably at the same times every day. Avoid going long periods of time without eating. Eat foods that are high in fiber, such as fresh fruits, vegetables, beans, and whole grains. Eat 4-6 oz (112-168 g) of lean protein each day, such as lean meat, chicken, fish, eggs, or tofu. One ounce (oz) (28 g) of lean protein is equal to: 1 oz (28 g) of meat, chicken, or fish. 1 egg.  cup (62 g) of tofu. Eat some foods each day that contain healthy fats, such as avocado, nuts, seeds, and fish. What foods should I eat? Fruits Berries. Apples. Oranges. Peaches. Apricots. Plums. Grapes. Mangoes. Papayas. Pomegranates. Kiwi. Cherries. Vegetables Leafy greens, including lettuce, spinach, kale, chard, collard greens, mustard greens, and cabbage. Beets. Cauliflower. Broccoli. Carrots. Green beans. Tomatoes. Peppers. Onions. Cucumbers. Brussels sprouts. Grains Whole grains, such as whole-wheat or  whole-grain bread, crackers, tortillas, cereal, and pasta. Unsweetened oatmeal. Quinoa. Brown or wild rice. Meats and other proteins Seafood. Poultry without skin. Lean cuts of poultry and beef. Tofu. Nuts. Seeds. Dairy Low-fat or fat-free dairy products such as milk, yogurt, and cheese. The items listed above may not be a complete list of foods and beverages you can eat and drink. Contact a dietitian for more information. What foods should I avoid? Fruits Fruits canned with syrup. Vegetables Canned vegetables. Frozen vegetables with butter or cream sauce. Grains Refined white flour and flour products such as bread, pasta, snack foods, and cereals. Avoid all processed foods. Meats and other proteins Fatty cuts of meat. Poultry with skin. Breaded or fried meats. Processed meat. Avoid  saturated fats. Dairy Full-fat yogurt, cheese, or milk. Beverages Sweetened drinks, such as soda or iced tea. The items listed above may not be a complete list of foods and beverages you should avoid. Contact a dietitian for more information. Questions to ask a health care provider Do I need to meet with a certified diabetes care and education specialist? Do I need to meet with a dietitian? What number can I call if I have questions? When are the best times to check my blood glucose? Where to find more information: American Diabetes Association: diabetes.org Academy of Nutrition and Dietetics: eatright.[employer] of Diabetes and Digestive and Kidney Diseases: StageSync.si Association of Diabetes Care & Education Specialists: diabeteseducator.org Summary It is important to have healthy eating habits because your blood sugar (glucose) levels are greatly affected by what you eat and drink. It is important to use alcohol carefully. A healthy meal plan will help you manage your blood glucose and lower your risk of heart disease. Your health care provider may recommend that you work with a dietitian to make a meal plan that is best for you. This information is not intended to replace advice given to you by your health care provider. Make sure you discuss any questions you have with your health care provider. Document Revised: 09/25/2019 Document Reviewed: 09/25/2019 Elsevier Patient Education  2023 [employer].   Next appointment: Follow up in one year for your annual wellness visit.   Preventive Care 24 Years and Older, Male  Preventive care refers to lifestyle choices and visits with your health care provider that can promote health and wellness. What does preventive care include? A yearly physical exam. This is also called an annual well check. Dental exams once or twice a year. Routine eye exams. Ask your health care provider how often you should have your eyes  checked. Personal lifestyle choices, including: Daily care of your teeth and gums. Regular physical activity. Eating a healthy diet. Avoiding tobacco and drug use. Limiting alcohol use. Practicing safe sex. Taking low doses of aspirin every day. Taking vitamin and mineral supplements as recommended by your health care provider. What happens during an annual well check? The services and screenings done by your health care provider during your annual well check will depend on your age, overall health, lifestyle risk factors, and family history of disease. Counseling  Your health care provider may ask you questions about your: Alcohol use. Tobacco use. Drug use. Emotional well-being. Home and relationship well-being. Sexual activity. Eating habits. History of falls. Memory and ability to understand (cognition). Work and work Astronomer. Screening  You may have the following tests or measurements: Height, weight, and BMI. Blood pressure. Lipid and cholesterol levels. These may be checked every 5 years, or more frequently if  you are over 56 years old. Skin check. Lung cancer screening. You may have this screening every year starting at age 73 if you have a 30-pack-year history of smoking and currently smoke or have quit within the past 15 years. Fecal occult blood test (FOBT) of the stool. You may have this test every year starting at age 28. Flexible sigmoidoscopy or colonoscopy. You may have a sigmoidoscopy every 5 years or a colonoscopy every 10 years starting at age 78. Prostate cancer screening. Recommendations will vary depending on your family history and other risks. Hepatitis C blood test. Hepatitis B blood test. Sexually transmitted disease (STD) testing. Diabetes screening. This is done by checking your blood sugar (glucose) after you have not eaten for a while (fasting). You may have this done every 1-3 years. Abdominal aortic aneurysm (AAA) screening. You may need this  if you are a current or former smoker. Osteoporosis. You may be screened starting at age 48 if you are at high risk. Talk with your health care provider about your test results, treatment options, and if necessary, the need for more tests. Vaccines  Your health care provider may recommend certain vaccines, such as: Influenza vaccine. This is recommended every year. Tetanus, diphtheria, and acellular pertussis (Tdap, Td) vaccine. You may need a Td booster every 10 years. Zoster vaccine. You may need this after age 33. Pneumococcal 13-valent conjugate (PCV13) vaccine. One dose is recommended after age 13. Pneumococcal polysaccharide (PPSV23) vaccine. One dose is recommended after age 71. Talk to your health care provider about which screenings and vaccines you need and how often you need them. This information is not intended to replace advice given to you by your health care provider. Make sure you discuss any questions you have with your health care provider. Document Released: 03/20/2015 Document Revised: 11/11/2015 Document Reviewed: 12/23/2014 Elsevier Interactive Patient Education  2017 [employer].  Fall Prevention in the Home Falls can cause injuries. They can happen to people of all ages. There are many things you can do to make your home safe and to help prevent falls. What can I do on the outside of my home? Regularly fix the edges of walkways and driveways and fix any cracks. Remove anything that might make you trip as you walk through a door, such as a raised step or threshold. Trim any bushes or trees on the path to your home. Use bright outdoor lighting. Clear any walking paths of anything that might make someone trip, such as rocks or tools. Regularly check to see if handrails are loose or broken. Make sure that both sides of any steps have handrails. Any raised decks and porches should have guardrails on the edges. Have any leaves, snow, or ice cleared regularly. Use sand  or salt on walking paths during winter. Clean up any spills in your garage right away. This includes oil or grease spills. What can I do in the bathroom? Use night lights. Install grab bars by the toilet and in the tub and shower. Do not use towel bars as grab bars. Use non-skid mats or decals in the tub or shower. If you need to sit down in the shower, use a plastic, non-slip stool. Keep the floor dry. Clean up any water that spills on the floor as soon as it happens. Remove soap buildup in the tub or shower regularly. Attach bath mats securely with double-sided non-slip rug tape. Do not have throw rugs and other things on the floor that can make you  trip. What can I do in the bedroom? Use night lights. Make sure that you have a light by your bed that is easy to reach. Do not use any sheets or blankets that are too big for your bed. They should not hang down onto the floor. Have a firm chair that has side arms. You can use this for support while you get dressed. Do not have throw rugs and other things on the floor that can make you trip. What can I do in the kitchen? Clean up any spills right away. Avoid walking on wet floors. Keep items that you use a lot in easy-to-reach places. If you need to reach something above you, use a strong step stool that has a grab bar. Keep electrical cords out of the way. Do not use floor polish or wax that makes floors slippery. If you must use wax, use non-skid floor wax. Do not have throw rugs and other things on the floor that can make you trip. What can I do with my stairs? Do not leave any items on the stairs. Make sure that there are handrails on both sides of the stairs and use them. Fix handrails that are broken or loose. Make sure that handrails are as long as the stairways. Check any carpeting to make sure that it is firmly attached to the stairs. Fix any carpet that is loose or worn. Avoid having throw rugs at the top or bottom of the stairs.  If you do have throw rugs, attach them to the floor with carpet tape. Make sure that you have a light switch at the top of the stairs and the bottom of the stairs. If you do not have them, ask someone to add them for you. What else can I do to help prevent falls? Wear shoes that: Do not have high heels. Have rubber bottoms. Are comfortable and fit you well. Are closed at the toe. Do not wear sandals. If you use a stepladder: Make sure that it is fully opened. Do not climb a closed stepladder. Make sure that both sides of the stepladder are locked into place. Ask someone to hold it for you, if possible. Clearly mark and make sure that you can see: Any grab bars or handrails. First and last steps. Where the edge of each step is. Use tools that help you move around (mobility aids) if they are needed. These include: Canes. Walkers. Scooters. Crutches. Turn on the lights when you go into a dark area. Replace any light bulbs as soon as they burn out. Set up your furniture so you have a clear path. Avoid moving your furniture around. If any of your floors are uneven, fix them. If there are any pets around you, be aware of where they are. Review your medicines with your doctor. Some medicines can make you feel dizzy. This can increase your chance of falling. Ask your doctor what other things that you can do to help prevent falls. This information is not intended to replace advice given to you by your health care provider. Make sure you discuss any questions you have with your health care provider. Document Released: 12/18/2008 Document Revised: 07/30/2015 Document Reviewed: 03/28/2014 Elsevier Interactive Patient Education  2017 [employer].

## 2022-07-19 NOTE — Telephone Encounter (Signed)
I put form up front for patient to come by and sign.

## 2022-07-20 ENCOUNTER — Ambulatory Visit: Payer: Medicare Other | Admitting: Family Medicine

## 2022-07-21 NOTE — Telephone Encounter (Signed)
Paperwork was faxed back to [facility] and I received an ok confirmation

## 2022-08-02 ENCOUNTER — Ambulatory Visit: Payer: Medicare Other | Admitting: Nurse Practitioner

## 2022-08-03 DIAGNOSIS — H43819 Vitreous degeneration, unspecified eye: Secondary | ICD-10-CM | POA: Diagnosis not present

## 2022-08-03 DIAGNOSIS — H353121 Nonexudative age-related macular degeneration, left eye, early dry stage: Secondary | ICD-10-CM | POA: Diagnosis not present

## 2022-08-03 DIAGNOSIS — H353213 Exudative age-related macular degeneration, right eye, with inactive scar: Secondary | ICD-10-CM | POA: Diagnosis not present

## 2022-08-03 DIAGNOSIS — E1165 Type 2 diabetes mellitus with hyperglycemia: Secondary | ICD-10-CM | POA: Diagnosis not present

## 2022-08-03 DIAGNOSIS — H35341 Macular cyst, hole, or pseudohole, right eye: Secondary | ICD-10-CM | POA: Diagnosis not present

## 2022-08-03 DIAGNOSIS — H43821 Vitreomacular adhesion, right eye: Secondary | ICD-10-CM | POA: Diagnosis not present

## 2022-08-03 DIAGNOSIS — H35372 Puckering of macula, left eye: Secondary | ICD-10-CM | POA: Diagnosis not present

## 2022-08-04 ENCOUNTER — Ambulatory Visit: Payer: Self-pay

## 2022-08-04 DIAGNOSIS — Z7901 Long term (current) use of anticoagulants: Secondary | ICD-10-CM | POA: Diagnosis not present

## 2022-08-04 DIAGNOSIS — E785 Hyperlipidemia, unspecified: Secondary | ICD-10-CM | POA: Diagnosis not present

## 2022-08-04 DIAGNOSIS — Z951 Presence of aortocoronary bypass graft: Secondary | ICD-10-CM | POA: Diagnosis not present

## 2022-08-04 DIAGNOSIS — S81802A Unspecified open wound, left lower leg, initial encounter: Secondary | ICD-10-CM | POA: Diagnosis not present

## 2022-08-04 DIAGNOSIS — I480 Paroxysmal atrial fibrillation: Secondary | ICD-10-CM | POA: Diagnosis not present

## 2022-08-04 DIAGNOSIS — I130 Hypertensive heart and chronic kidney disease with heart failure and stage 1 through stage 4 chronic kidney disease, or unspecified chronic kidney disease: Secondary | ICD-10-CM | POA: Diagnosis not present

## 2022-08-04 DIAGNOSIS — I2510 Atherosclerotic heart disease of native coronary artery without angina pectoris: Secondary | ICD-10-CM | POA: Diagnosis not present

## 2022-08-04 DIAGNOSIS — Z87891 Personal history of nicotine dependence: Secondary | ICD-10-CM | POA: Diagnosis not present

## 2022-08-04 DIAGNOSIS — I5030 Unspecified diastolic (congestive) heart failure: Secondary | ICD-10-CM | POA: Diagnosis not present

## 2022-08-04 DIAGNOSIS — N183 Chronic kidney disease, stage 3 unspecified: Secondary | ICD-10-CM | POA: Diagnosis not present

## 2022-08-04 DIAGNOSIS — E1122 Type 2 diabetes mellitus with diabetic chronic kidney disease: Secondary | ICD-10-CM | POA: Diagnosis not present

## 2022-08-04 DIAGNOSIS — Z794 Long term (current) use of insulin: Secondary | ICD-10-CM | POA: Diagnosis not present

## 2022-08-04 NOTE — Patient Outreach (Signed)
  Care Coordination   08/04/2022 Name: [name] [name] MRN: [id number] DOB: 07-01-1933   Care Coordination Outreach Attempts:  Successful outreach made to patient.  Patient states he is currently at a cardiology visit and will therefore need to reschedule today's appointments.   Follow Up Plan:  Additional outreach attempts will be made to offer the patient care coordination information and services.   Encounter Outcome:  Pt. Request to Call Back   Care Coordination Interventions:  No, not indicated    George Ina [facility] Care Coordination [phone] direct line

## 2022-08-05 ENCOUNTER — Encounter: Payer: Self-pay | Admitting: Family Medicine

## 2022-08-05 ENCOUNTER — Ambulatory Visit (INDEPENDENT_AMBULATORY_CARE_PROVIDER_SITE_OTHER): Payer: Medicare Other | Admitting: Family Medicine

## 2022-08-05 VITALS — BP 122/60 | HR 53 | Temp 97.5°F | Ht 73.0 in | Wt 212.0 lb

## 2022-08-05 DIAGNOSIS — M25562 Pain in left knee: Secondary | ICD-10-CM

## 2022-08-05 DIAGNOSIS — S81802A Unspecified open wound, left lower leg, initial encounter: Secondary | ICD-10-CM | POA: Diagnosis not present

## 2022-08-05 DIAGNOSIS — G8929 Other chronic pain: Secondary | ICD-10-CM | POA: Diagnosis not present

## 2022-08-05 DIAGNOSIS — M25561 Pain in right knee: Secondary | ICD-10-CM | POA: Diagnosis not present

## 2022-08-05 NOTE — Patient Instructions (Signed)
Please try capsaicin cream for your right knee to see if that will help with the discomfort.

## 2022-08-10 DIAGNOSIS — S81802A Unspecified open wound, left lower leg, initial encounter: Secondary | ICD-10-CM | POA: Insufficient documentation

## 2022-08-10 NOTE — Assessment & Plan Note (Signed)
Wound as outlined in note.  No signs of infection.  Patient will continue to cleanse with soap and water.  Discussed if persisting may need to be biopsied by dermatology.  If not healing he may need to see wound care.

## 2022-08-10 NOTE — Assessment & Plan Note (Signed)
Chronic issue. Patient with likely arthritis in his knees. Discussed a trial of capsaicin topically to see if this would provide benefit.

## 2022-08-10 NOTE — Progress Notes (Signed)
Marikay Alar, MD Phone: [phone]  [name] [name] is a 89 y.o. male who presents today for f/u.  Wound: notes wound on left lower leg that has been present a couple of weeks. Notes it typically occurs this time of year in the same location and eventually heals on its own. Some discomfort.   Knee pain: notes a burning pain in both knees R>L. Lasts 15-20 minutes and it worse at night. Has tried topical treatment at home for this. Can not take NSAIDs due to being on eliquis.   Social History   Tobacco Use  Smoking Status Never  Smokeless Tobacco Never    Current Outpatient Medications on File Prior to Visit  Medication Sig Dispense Refill   acetaminophen (TYLENOL) 325 MG tablet Take by mouth.     amiodarone (PACERONE) 200 MG tablet Take 200 mg by mouth daily.     apixaban (ELIQUIS) 2.5 MG TABS tablet Take 1 tablet (2.5 mg total) by mouth 2 (two) times daily. 120 tablet 0   BD PEN NEEDLE NANO U/F 32G X 4 MM MISC USE AS DIRECTED 100 each 0   carvedilol (COREG) 12.5 MG tablet TAKE ONE (1) TABLET BY MOUTH TWO TIMES PER DAY (Patient taking differently: Take 12.5 mg by mouth 2 (two) times daily with a meal.) 60 tablet 0   Cholecalciferol (VITAMIN D3) 50 MCG (2000 UT) TABS Take 2,000 Units by mouth in the morning.     clotrimazole (LOTRIMIN) 1 % cream Apply 1 Application topically 2 (two) times daily.     dapagliflozin propanediol (FARXIGA) 10 MG TABS tablet Take by mouth.     doxazosin (CARDURA) 2 MG tablet Take 1 tablet (2 mg total) by mouth 2 (two) times daily. Please keep upcoming appt for future refills 180 tablet 0   ezetimibe (ZETIA) 10 MG tablet TAKE 1 TABLET BY MOUTH DAILY 90 tablet 0   finasteride (PROSCAR) 5 MG tablet Take 1 tablet (5 mg total) by mouth daily. (Patient taking differently: Take 5 mg by mouth every evening.) 90 tablet 1   gabapentin (NEURONTIN) 100 MG capsule Take 100 mg by mouth 3 (three) times daily.     HUMALOG KWIKPEN 100 UNIT/ML KwikPen Inject into the  skin.     hydrALAZINE (APRESOLINE) 25 MG tablet Take 1 tablet (25 mg total) by mouth 3 (three) times daily. 270 tablet 3   insulin glargine, 1 Unit Dial, (TOUJEO) 300 UNIT/ML Solostar Pen Inject 40 Units into the skin daily. 4.5 mL 3   isosorbide mononitrate (IMDUR) 30 MG 24 hr tablet Take 1 tablet (30 mg total) by mouth daily. Please call [phone] for a sooner appointment and for further refills. 30 tablet 0   Multiple Vitamins-Minerals (PRESERVISION AREDS 2 PO) Take 1 tablet by mouth in the morning and at bedtime.     omeprazole (PRILOSEC) 20 MG capsule TAKE 1 CAPSULE BY MOUTH TWO TIMES DAILY 60 capsule 1   ondansetron (ZOFRAN-ODT) 4 MG disintegrating tablet Take 1 tablet (4 mg total) by mouth every 8 (eight) hours as needed for nausea or vomiting. 20 tablet 0   potassium chloride (KLOR-CON) 10 MEQ tablet Take 1 tablet (10 mEq total) by mouth daily as needed. Take with torsemide 90 tablet 3   TOUJEO SOLOSTAR 300 UNIT/ML Solostar Pen Inject 10 Units into the skin daily. 22.5 mL 6   azelastine (ASTELIN) 0.1 % nasal spray Place 2 sprays into both nostrils 2 (two) times daily. Use in each nostril as directed (Patient not taking: Reported  on 08/05/2022) 30 mL 2   furosemide (LASIX) 20 MG tablet Take 20 mg by mouth. Patient states he takes as needed depending on weight gain as recommended by provider (Patient not taking: Reported on 08/05/2022)     loperamide (IMODIUM A-D) 2 MG tablet Take 1 tablet (2 mg total) by mouth 3 (three) times daily as needed for diarrhea or loose stools. (Patient not taking: Reported on 08/05/2022) 30 tablet 0   No current facility-administered medications on file prior to visit.     ROS see history of present illness  Objective  Physical Exam Vitals:   08/05/22 1638  BP: 122/60  Pulse: (!) 53  Temp: (!) 97.5 F (36.4 C)  SpO2: 97%    BP Readings from Last 3 Encounters:  08/05/22 122/60  05/18/22 116/66  04/13/22 130/60   Wt Readings from Last 3 Encounters:   08/05/22 212 lb (96.2 kg)  07/19/22 210 lb (95.3 kg)  05/18/22 210 lb 6.4 oz (95.4 kg)    Physical Exam Musculoskeletal:     Comments: Bilateral knees with no swelling, warmth, erythema, or tenderness      Assessment/Plan: Please see individual problem list.  Chronic pain of both knees Assessment & Plan: Chronic issue. Patient with likely arthritis in his knees. Discussed a trial of capsaicin topically to see if this would provide benefit.    Wound of left lower extremity, initial encounter Assessment & Plan: Wound as outlined in note.  No signs of infection.  Patient will continue to cleanse with soap and water.  Discussed if persisting may need to be biopsied by dermatology.  If not healing he may need to see wound care.     Return in about 3 months (around 11/05/2022).   Marikay Alar, MD [facility]

## 2022-08-11 NOTE — Telephone Encounter (Signed)
Fleet Contras called from [facility] to follow-up on patient's signed paperwork.  Fleet Contras states they have not received it yet.  I let her know that we did fax it and received a confirmation on this end.  I let Fleet Contras know that I'll be glad to fax it to her again.  I faxed document and spoke with Fleet Contras to let her know we received confirmation on our end.  Fleet Contras states she will give it a few minutes to come through on their end, but if she does not receive it, then she will call us back.

## 2022-08-17 DIAGNOSIS — Z794 Long term (current) use of insulin: Secondary | ICD-10-CM | POA: Diagnosis not present

## 2022-08-17 DIAGNOSIS — E1122 Type 2 diabetes mellitus with diabetic chronic kidney disease: Secondary | ICD-10-CM | POA: Diagnosis not present

## 2022-08-17 DIAGNOSIS — E785 Hyperlipidemia, unspecified: Secondary | ICD-10-CM | POA: Diagnosis not present

## 2022-08-17 DIAGNOSIS — E1159 Type 2 diabetes mellitus with other circulatory complications: Secondary | ICD-10-CM | POA: Diagnosis not present

## 2022-08-17 DIAGNOSIS — N1831 Chronic kidney disease, stage 3a: Secondary | ICD-10-CM | POA: Diagnosis not present

## 2022-08-17 DIAGNOSIS — E1169 Type 2 diabetes mellitus with other specified complication: Secondary | ICD-10-CM | POA: Diagnosis not present

## 2022-08-18 ENCOUNTER — Ambulatory Visit: Payer: Self-pay

## 2022-08-18 NOTE — Patient Outreach (Signed)
  Care Coordination   Follow Up Visit Note   08/18/2022 Name: [name] [name] MRN: [id number] DOB: 1933/11/20  [name] [name] is a 89 y.o. year old male who sees [name], [name], MD for primary care. I spoke with  [name] by phone today.  What matters to the patients health and wellness today?  Patient reports having left lower leg/ chin wound.  He states for approximately 4-5 years he develops blisters to his left lower leg/ chin area that burst and create a wound.  He states is usually last a few weeks. He states he has seen several doctors for this and had it biopsied but still unable to determine what is causing this.  Patient states his PCP is referring him to the wound clinic.  Patient reports blood sugars range from 110-225.  He reports fasting blood sugars range from 110-140.  He states he is wearing a CGM. He reports his endocrinologist started him on  Ozempic approximately 2 weeks ago.  He states he has experienced some nausea but was prescribed antiemetic.  Patient states he has lost 3 lbs over the last 2 weeks. He states he continues to bicycle 5 days per week 3 miles each day.  Patient reports toujeo being discontinued and tresiba being added to medication regimen. Patient denies any increase in SOB or lower extremity swelling.    Goals Addressed             This Visit's Progress    Development of plan of care for management of diabetes and new onset shortness of breath/ health conditions       Interventions Today    Flowsheet Row Most Recent Value  Chronic Disease   Chronic disease during today's visit Diabetes, Congestive Heart Failure (CHF), Atrial Fibrillation (AFib)  General Interventions   General Interventions Discussed/Reviewed General Interventions Reviewed, Doctor Visits, Labs  [evaluation of current treatment plan for HF, A-Fib, DM, left leg wound and patients adherence to plan as established by provider. Assessed for HF, A-fib symptoms.  Assessed blood  sugar readings. Assessed for signs of infection for left leg wound.]  Labs Hgb A1c every 6 months  [Reviewed most recent Hgb]  Doctor Visits Discussed/Reviewed Doctor Visits Reviewed  Annabell Sabal upcoming provider appointments]  Exercise Interventions   Exercise Discussed/Reviewed Physical Activity  [asessed for ongoing physical activity.]  Education Interventions   Education Provided Provided Education  [Advised to weigh daily and record.  Advised to notify provider for 3 lb weight gain overnight or 5 lbs in a week weight gain.  Notify provider of mild to moderate symptoms. Call 911 for severe symptoms. Discussed signs/ symptom of infection]  Provided Verbal Education On Blood Sugar Monitoring  [Advised to continue monitoring blood sugars as recommended.]  Pharmacy Interventions   Pharmacy Dicussed/Reviewed Pharmacy Topics Reviewed  [medications reviewed and compliance discussed.]                 SDOH assessments and interventions completed:  No     Care Coordination Interventions:  Yes, provided   Follow up plan: Follow up call scheduled for 10/11/22    Encounter Outcome:  Pt. Visit Completed  George Ina RN,BSN,CCM [facility] Care Coordination [phone] direct line

## 2022-08-19 ENCOUNTER — Ambulatory Visit: Payer: Medicare Other | Admitting: Family Medicine

## 2022-09-03 DIAGNOSIS — E1165 Type 2 diabetes mellitus with hyperglycemia: Secondary | ICD-10-CM | POA: Diagnosis not present

## 2022-09-16 ENCOUNTER — Ambulatory Visit: Payer: Medicare Other | Admitting: Family Medicine

## 2022-09-20 ENCOUNTER — Other Ambulatory Visit: Payer: Self-pay | Admitting: Family Medicine

## 2022-09-23 ENCOUNTER — Other Ambulatory Visit: Payer: Self-pay

## 2022-10-03 DIAGNOSIS — E1165 Type 2 diabetes mellitus with hyperglycemia: Secondary | ICD-10-CM | POA: Diagnosis not present

## 2022-10-05 ENCOUNTER — Other Ambulatory Visit: Payer: Self-pay

## 2022-10-10 DIAGNOSIS — N189 Chronic kidney disease, unspecified: Secondary | ICD-10-CM | POA: Diagnosis not present

## 2022-10-10 DIAGNOSIS — I10 Essential (primary) hypertension: Secondary | ICD-10-CM | POA: Diagnosis not present

## 2022-10-10 DIAGNOSIS — E1122 Type 2 diabetes mellitus with diabetic chronic kidney disease: Secondary | ICD-10-CM | POA: Diagnosis not present

## 2022-10-10 DIAGNOSIS — R809 Proteinuria, unspecified: Secondary | ICD-10-CM | POA: Diagnosis not present

## 2022-10-10 DIAGNOSIS — N1831 Chronic kidney disease, stage 3a: Secondary | ICD-10-CM | POA: Diagnosis not present

## 2022-10-11 ENCOUNTER — Ambulatory Visit: Payer: Self-pay

## 2022-10-11 DIAGNOSIS — L821 Other seborrheic keratosis: Secondary | ICD-10-CM | POA: Diagnosis not present

## 2022-10-11 DIAGNOSIS — L820 Inflamed seborrheic keratosis: Secondary | ICD-10-CM | POA: Diagnosis not present

## 2022-10-11 DIAGNOSIS — D692 Other nonthrombocytopenic purpura: Secondary | ICD-10-CM | POA: Diagnosis not present

## 2022-10-11 DIAGNOSIS — R21 Rash and other nonspecific skin eruption: Secondary | ICD-10-CM | POA: Diagnosis not present

## 2022-10-11 DIAGNOSIS — Z85828 Personal history of other malignant neoplasm of skin: Secondary | ICD-10-CM | POA: Diagnosis not present

## 2022-10-11 NOTE — Patient Outreach (Signed)
Care Coordination   Follow Up Visit Note   10/11/2022 Name: [name] [name] MRN: [id number] DOB: [birth year]  [name] [name] is a 89 y.o. year old male who sees [name], [name], MD for primary care. I spoke with  [name] by phone today.  What matters to the patients health and wellness today?  Patient states he was unable to get into the wound clinic regarding his left lower extremity sore/blister area.  He states eventually the area dried out and healed.  He states the blisters returned approximately 1 week ago.  Patient states he is scheduled to see a dermatologist today to follow up on this.  Patient reports medication change for his diabetes.  He states he is not taking tresiba and no longer toujeo.  He states his tresiba units were adjusted because he had 2 low blood sugar readings last week of 67 and 56.  Patient states his blood sugars seem to increase in the evening and night.  He states his endocrinologist added sliding scale humalog for him to manage the elevated blood sugars. Patient states he continues to wear a CGM.  Patient reports overall blood sugars have ranged from 56-275.  Patient reports having follow up visit with nephrologist 10/10/22.  He states his kidneys are fair but blood pressure dropped in office to 91/65.  Patient states nephrologist discontinued his cardura for now.  Patient reports yesterday's blood pressure was 170/69 and today was 165/55. Patient states he has purchased two blood pressure monitor and taken them to his doctor's office for calibration.  Patient states he continues to ride his bike 3-4 times per week. Reports weight is between 201-202 lbs.  Patient denies any HF / A-fib symptoms at this time.    Goals Addressed             This Visit's Progress    Development of plan of care for management of diabetes and new onset shortness of breath/ health conditions       Interventions Today    Flowsheet Row Most Recent Value  Chronic Disease    Chronic disease during today's visit Diabetes, Congestive Heart Failure (CHF), Atrial Fibrillation (AFib), Other  [left lower leg blister/ sore.]  General Interventions   General Interventions Discussed/Reviewed General Interventions Reviewed, Doctor Visits, Labs  [evaluation of current treatment plan related to DM, HF, A-fib and patients adherence to plan as established by provider.  Assessed for blood sugar readings, heart failure and A-fib symptoms. Assessed for ongoing LLE sore/ blister]  Labs Hgb A1c every 3 months  [reviewed most recent Hgb A1c and goal.]  Doctor Visits Discussed/Reviewed Doctor Visits Reviewed  Annabell Sabal upcoming provider visits. Advised to keep follow up visit with providers.]  Exercise Interventions   Exercise Discussed/Reviewed Physical Activity  [assessed activity level.  Encouraged patient to remain active and continue daily exercise.]  Education Interventions   Education Provided Provided Education  Provided Verbal Education On Blood Sugar Monitoring  [discussed hypoglycemic treatment.  advised to add protein after treating for Hypoglycemic event.]  Nutrition Interventions   Nutrition Discussed/Reviewed Nutrition Reviewed  Pharmacy Interventions   Pharmacy Dicussed/Reviewed Pharmacy Topics Reviewed  [medications adjustments discussed.  Advised to take medications as prescribed.  Advised to contact provider if questions concerning medications.]                 SDOH assessments and interventions completed:  No     Care Coordination Interventions:  Yes, provided   Follow up plan: Follow up  call scheduled for 11/16/22    Encounter Outcome:  Pt. Visit Completed   George Ina RN,BSN,CCM [facility] Care Coordination [phone] direct line

## 2022-10-11 NOTE — Patient Instructions (Signed)
Visit Information  Thank you for taking time to visit with me today. Please don't hesitate to contact me if I can be of assistance to you.   Following are the goals we discussed today:   Goals Addressed             This Visit's Progress    Development of plan of care for management of diabetes and new onset shortness of breath/ health conditions       Interventions Today    Flowsheet Row Most Recent Value  Chronic Disease   Chronic disease during today's visit Diabetes, Congestive Heart Failure (CHF), Atrial Fibrillation (AFib), Other  [left lower leg blister/ sore.]  General Interventions   General Interventions Discussed/Reviewed General Interventions Reviewed, Doctor Visits, Labs  [evaluation of current treatment plan related to DM, HF, A-fib and patients adherence to plan as established by provider.  Assessed for blood sugar readings, heart failure and A-fib symptoms. Assessed for ongoing LLE sore/ blister]  Labs Hgb A1c every 3 months  [reviewed most recent Hgb A1c and goal.]  Doctor Visits Discussed/Reviewed Doctor Visits Reviewed  Annabell Sabal upcoming provider visits. Advised to keep follow up visit with providers.]  Exercise Interventions   Exercise Discussed/Reviewed Physical Activity  [assessed activity level.  Encouraged patient to remain active and continue daily exercise.]  Education Interventions   Education Provided Provided Education  Provided Verbal Education On Blood Sugar Monitoring  [discussed hypoglycemic treatment.  advised to add protein after treating for Hypoglycemic event.]  Nutrition Interventions   Nutrition Discussed/Reviewed Nutrition Reviewed  Pharmacy Interventions   Pharmacy Dicussed/Reviewed Pharmacy Topics Reviewed  [medications adjustments discussed.  Advised to take medications as prescribed.  Advised to contact provider if questions concerning medications.]                 Our next appointment is by telephone on 11/16/22 at 10 am  Please  call the care guide team at [phone] if you need to cancel or reschedule your appointment.   If you are experiencing a Mental Health or Behavioral Health Crisis or need someone to talk to, please call the Suicide and Crisis Lifeline: 988 call 1-800-273-TALK (toll free, 24 hour hotline)  Patient verbalizes understanding of instructions and care plan provided today and agrees to view in MyChart. Active MyChart status and patient understanding of how to access instructions and care plan via MyChart confirmed with patient.     George Ina RN,BSN,CCM [facility] Care Coordination [phone] direct line

## 2022-10-12 ENCOUNTER — Ambulatory Visit: Payer: Medicare Other | Admitting: Neurology

## 2022-10-18 ENCOUNTER — Other Ambulatory Visit: Payer: Self-pay

## 2022-10-19 DIAGNOSIS — E785 Hyperlipidemia, unspecified: Secondary | ICD-10-CM | POA: Diagnosis not present

## 2022-10-19 DIAGNOSIS — Z794 Long term (current) use of insulin: Secondary | ICD-10-CM | POA: Diagnosis not present

## 2022-10-19 DIAGNOSIS — N1831 Chronic kidney disease, stage 3a: Secondary | ICD-10-CM | POA: Diagnosis not present

## 2022-10-19 DIAGNOSIS — E1159 Type 2 diabetes mellitus with other circulatory complications: Secondary | ICD-10-CM | POA: Diagnosis not present

## 2022-10-19 DIAGNOSIS — E1122 Type 2 diabetes mellitus with diabetic chronic kidney disease: Secondary | ICD-10-CM | POA: Diagnosis not present

## 2022-10-19 DIAGNOSIS — E1169 Type 2 diabetes mellitus with other specified complication: Secondary | ICD-10-CM | POA: Diagnosis not present

## 2022-10-20 ENCOUNTER — Encounter (INDEPENDENT_AMBULATORY_CARE_PROVIDER_SITE_OTHER): Payer: Self-pay

## 2022-10-27 DIAGNOSIS — E785 Hyperlipidemia, unspecified: Secondary | ICD-10-CM | POA: Diagnosis not present

## 2022-10-27 DIAGNOSIS — N183 Chronic kidney disease, stage 3 unspecified: Secondary | ICD-10-CM | POA: Diagnosis not present

## 2022-10-27 DIAGNOSIS — I2510 Atherosclerotic heart disease of native coronary artery without angina pectoris: Secondary | ICD-10-CM | POA: Diagnosis not present

## 2022-10-27 DIAGNOSIS — Z87891 Personal history of nicotine dependence: Secondary | ICD-10-CM | POA: Diagnosis not present

## 2022-10-27 DIAGNOSIS — I130 Hypertensive heart and chronic kidney disease with heart failure and stage 1 through stage 4 chronic kidney disease, or unspecified chronic kidney disease: Secondary | ICD-10-CM | POA: Diagnosis not present

## 2022-10-27 DIAGNOSIS — Z951 Presence of aortocoronary bypass graft: Secondary | ICD-10-CM | POA: Diagnosis not present

## 2022-10-27 DIAGNOSIS — I480 Paroxysmal atrial fibrillation: Secondary | ICD-10-CM | POA: Diagnosis not present

## 2022-10-27 DIAGNOSIS — I5030 Unspecified diastolic (congestive) heart failure: Secondary | ICD-10-CM | POA: Diagnosis not present

## 2022-11-03 DIAGNOSIS — E1165 Type 2 diabetes mellitus with hyperglycemia: Secondary | ICD-10-CM | POA: Diagnosis not present

## 2022-11-08 ENCOUNTER — Emergency Department: Payer: Medicare Other

## 2022-11-08 ENCOUNTER — Other Ambulatory Visit: Payer: Self-pay

## 2022-11-08 ENCOUNTER — Emergency Department
Admission: EM | Admit: 2022-11-08 | Discharge: 2022-11-08 | Disposition: A | Discharge status: Home / Self Care | Payer: Medicare Other | Attending: Emergency Medicine | Admitting: Emergency Medicine

## 2022-11-08 ENCOUNTER — Encounter: Payer: Self-pay | Admitting: Emergency Medicine

## 2022-11-08 DIAGNOSIS — R531 Weakness: Secondary | ICD-10-CM | POA: Diagnosis not present

## 2022-11-08 DIAGNOSIS — I4891 Unspecified atrial fibrillation: Secondary | ICD-10-CM | POA: Insufficient documentation

## 2022-11-08 DIAGNOSIS — N179 Acute kidney failure, unspecified: Secondary | ICD-10-CM | POA: Diagnosis not present

## 2022-11-08 DIAGNOSIS — I10 Essential (primary) hypertension: Secondary | ICD-10-CM | POA: Diagnosis not present

## 2022-11-08 DIAGNOSIS — H43821 Vitreomacular adhesion, right eye: Secondary | ICD-10-CM | POA: Diagnosis not present

## 2022-11-08 DIAGNOSIS — Z7901 Long term (current) use of anticoagulants: Secondary | ICD-10-CM | POA: Insufficient documentation

## 2022-11-08 DIAGNOSIS — R202 Paresthesia of skin: Secondary | ICD-10-CM | POA: Diagnosis not present

## 2022-11-08 DIAGNOSIS — R200 Anesthesia of skin: Secondary | ICD-10-CM | POA: Insufficient documentation

## 2022-11-08 LAB — COMPREHENSIVE METABOLIC PANEL
ALT: 31 U/L (ref 0–44)
AST: 35 U/L (ref 15–41)
Albumin: 3.5 g/dL (ref 3.5–5.0)
Alkaline Phosphatase: 240 U/L — ABNORMAL HIGH (ref 38–126)
Anion gap: 9 (ref 5–15)
BUN: 28 mg/dL — ABNORMAL HIGH (ref 8–23)
CO2: 22 mmol/L (ref 22–32)
Calcium: 9 mg/dL (ref 8.9–10.3)
Chloride: 107 mmol/L (ref 98–111)
Creatinine, Ser: 2.01 mg/dL — ABNORMAL HIGH (ref 0.61–1.24)
GFR, Estimated: 32 mL/min — ABNORMAL LOW (ref >60)
Glucose, Bld: 189 mg/dL — ABNORMAL HIGH (ref 70–99)
Potassium: 4.3 mmol/L (ref 3.5–5.1)
Sodium: 138 mmol/L (ref 135–145)
Total Bilirubin: 0.9 mg/dL (ref 0.3–1.2)
Total Protein: 6.1 g/dL — ABNORMAL LOW (ref 6.5–8.1)

## 2022-11-08 LAB — CBC
HCT: 35.7 % — ABNORMAL LOW (ref 39.0–52.0)
Hemoglobin: 12.4 g/dL — ABNORMAL LOW (ref 13.0–17.0)
MCH: 33.3 pg (ref 26.0–34.0)
MCHC: 34.7 g/dL (ref 30.0–36.0)
MCV: 96 fL (ref 80.0–100.0)
Platelets: 88 10*3/uL — ABNORMAL LOW (ref 150–400)
RBC: 3.72 MIL/uL — ABNORMAL LOW (ref 4.22–5.81)
RDW: 13 % (ref 11.5–15.5)
WBC: 4.7 10*3/uL (ref 4.0–10.5)
nRBC: 0 % (ref 0.0–0.2)

## 2022-11-08 LAB — PROTIME-INR
INR: 1.1 (ref 0.8–1.2)
Prothrombin Time: 14.6 s (ref 11.4–15.2)

## 2022-11-08 LAB — DIFFERENTIAL
Abs Immature Granulocytes: 0.01 10*3/uL (ref 0.00–0.07)
Basophils Absolute: 0 10*3/uL (ref 0.0–0.1)
Basophils Relative: 1 %
Eosinophils Absolute: 0.2 10*3/uL (ref 0.0–0.5)
Eosinophils Relative: 3 %
Immature Granulocytes: 0 %
Lymphocytes Relative: 28 %
Lymphs Abs: 1.3 10*3/uL (ref 0.7–4.0)
Monocytes Absolute: 0.7 10*3/uL (ref 0.1–1.0)
Monocytes Relative: 15 %
Neutro Abs: 2.5 10*3/uL (ref 1.7–7.7)
Neutrophils Relative %: 53 %

## 2022-11-08 LAB — ETHANOL: Alcohol, Ethyl (B): <10 mg/dL (ref <10)

## 2022-11-08 LAB — APTT: aPTT: 33 s (ref 24–36)

## 2022-11-08 LAB — CBG MONITORING, ED: Glucose-Capillary: 172 mg/dL — ABNORMAL HIGH (ref 70–99)

## 2022-11-08 MED ORDER — SODIUM CHLORIDE 0.9 % IV BOLUS
500.0000 mL | Freq: Once | INTRAVENOUS | Status: AC
  Administered 2022-11-08: 500 mL via INTRAVENOUS

## 2022-11-08 NOTE — Progress Notes (Signed)
4098 - Code Stroke Activated  Patient already in CT LKWT 2300 11/07/2022, mRS 2 Patient went to bed @ 2300 his normal self and woke at 0300 with left leg weakness & left foot numbness 0423 - Patient returned from CT  0424 - Tele-Neurologist paged [phone] - Dr. Lynnda Child on stroke cart  0430 - CT head results given to Dr. Imogene Burn on camera

## 2022-11-08 NOTE — ED Notes (Signed)
Patient transported to CT 

## 2022-11-08 NOTE — ED Notes (Signed)
Patient and family provided with discharge instructions including importance of follow up appt with stated understanding. INT removed, cannula intact, pressure dressing applied. Patient stable and wheeled out to car with spouse on dispo.

## 2022-11-08 NOTE — ED Triage Notes (Addendum)
Pt to triage via w/c with no distress noted; reports going to bed at 1130pm with no c/o; awoke at 3am to go to [facility] and noted "he felt funny and fell against the wall"; st his left foot/leg feels like its "asleep"; st similar symptoms last November with CVA; speech clear, A&Ox3, grips = in strength; pt able to stand for wt scale but is unsteady; charge nurse notified

## 2022-11-08 NOTE — ED Notes (Signed)
CODE STROKE Activated

## 2022-11-08 NOTE — Discharge Instructions (Signed)
As we discussed, you were evaluated for possible stroke, but your evaluation was reassuring.  It is more likely that you are mildly dehydrated and that caused your symptoms from prior stroke to get worse.  After getting IV fluids you felt quite a bit better.  Your preference was to not get an MRI tonight which is fine, but we recommend you follow-up with your regular doctor at the next available opportunity.  Drink plenty of fluids including water, Gatorade, Pedialyte, etc.  Return to the emergency department if you develop new or worsening symptoms that concern you.

## 2022-11-08 NOTE — ED Notes (Signed)
Tele neuro exam being completed by Dr. Imogene Burn at this time.

## 2022-11-08 NOTE — ED Notes (Signed)
Patient to room and connected to monitor. INT placed with labs drawn. EKG completed. Patient placed onto monitor.

## 2022-11-08 NOTE — ED Notes (Signed)
Pt cleared for CT by Dr Katrinka Blazing and pt taken via w/c by Benn Moulder RN

## 2022-11-08 NOTE — Consult Note (Signed)
[facility] Tele[facility]urology Consult Services   Patient Name:   [name] [name], [name] [name] Date of Birth:   09/07/1933 Identification Number:   MRN - [id number] Date of Service:   11/08/2022 04:24:01  Diagnosis:       I69.8 - Sequelae of other and unspecified cerebrovascular diseases  Impression:      The patient has a h/o R caudate infarct causing LLE weakness and he reports today lightheadedness and recurrence of symptoms related to his prior stroke. There are no new deficits today. NIHSS is 0. He has no drift in the leg but feels it is heavy and harder to walk. Part of that may be fluid related. However, I suspect unmasking due to toxic/metabolic/infectious causes. Orthostatic hypotension may play a part. CTH is negative for acute findings. He is not a candidate for IV thrombolysis. LVO not suspected.  Our recommendations are outlined below.  Recommendations:        Stroke/Telemetry Floor       Neuro Checks       Bedside Swallow Eval       DVT Prophylaxis       IV Fluids, Normal Saline       Head of Bed 30 Degrees       Euglycemia and Avoid Hyperthermia (PRN Acetaminophen)       orthostatic VS       Get WORKUP for TOXIC/METABOLIC/INFECTIOUS causes, including ammonia level, cbc, cmp, tsh and UA.       hold sedating medications.       Can reasonably hold off on MRI and assess for primary causes as above but If no other cause is identified or symptoms do not improve with treatment of likely causes then an MRI brain is indicated.  Sign Out:       Discussed with Emergency Department Provider    ------------------------------------------------------------------------------  Advanced Imaging: Advanced Imaging Deferred because:  Stroke not suspected with clinical presentation and exam   Metrics: Last Known Well: 11/07/2022 23:00:00 [facility] Notification Time: 11/08/2022 04:24:01 Arrival Time: 11/08/2022 04:11:00 Stamp Time: 11/08/2022 04:24:01 Initial Response  Time: 11/08/2022 04:28:51 Symptoms: LLE weakness. Initial patient interaction: 11/08/2022 04:31:06 NIHSS Assessment Completed: 11/08/2022 04:34:45 Patient is not a candidate for Thrombolytic. Thrombolytic Medical Decision: 11/08/2022 04:37:24 Patient was not deemed candidate for Thrombolytic because of following reasons: Last Well Known Above 4.5 Hours.  CT head showed no acute hemorrhage or acute core infarct.  Primary Provider Notified of Diagnostic Impression and Management Plan on: 11/08/2022 04:57:08    ------------------------------------------------------------------------------  History of Present Illness: Patient is a 89 year old Male.  Patient was brought by EMS for symptoms of LLE weakness. He had complained at bedtime that he was lightheaded. they checked his bp and it was not low. When he woke at 3am he felt L leg felt weak and heavy and he stumbled against the wall. His prior stroke was Nov 2023 with residual LLE weakness that has improved but sometimes it acts up. At baseline he uses a cane. He says it has has remained weak, and has had 2 times where he fell this past year. Currently has no drift in his leg but he says it is harder to walk. His wife says he has sores in his left leg. He also takes lasix and eliquis.   Past Medical History:      Hypertension      Diabetes Mellitus      Hyperlipidemia      Atrial Fibrillation      Coronary Artery  Disease      Stroke Other PMH:  CHF  CABG  Medications:  Anticoagulant use:  Yes eliquis Antiplatelet use: Unknown Reviewed EMR for current medications  Allergies:  Reviewed  Social History: Smoking: No  Family History:  There is no family history of premature cerebrovascular disease pertinent to this consultation  ROS : 14 Points Review of Systems was performed and was negative except mentioned in HPI.  Past Surgical History: There Is No Surgical History Contributory To Today's  Visit    Examination: BP(154/77), Pulse(56), Blood Glucose(172) 1A: Level of Consciousness - Alert; keenly responsive + 0 1B: Ask Month and Age - Both Questions Right + 0 1C: Blink Eyes & Squeeze Hands - Performs Both Tasks + 0 2: Test Horizontal Extraocular Movements - Normal + 0 3: Test Visual Fields - No Visual Loss + 0 4: Test Facial Palsy (Use Grimace if Obtunded) - Normal symmetry + 0 5A: Test Left Arm Motor Drift - No Drift for 10 Seconds + 0 5B: Test Right Arm Motor Drift - No Drift for 10 Seconds + 0 6A: Test Left Leg Motor Drift - No Drift for 5 Seconds + 0 6B: Test Right Leg Motor Drift - No Drift for 5 Seconds + 0 7: Test Limb Ataxia (FNF/Heel-Shin) - No Ataxia + 0 8: Test Sensation - Normal; No sensory loss + 0 9: Test Language/Aphasia - Normal; No aphasia + 0 10: Test Dysarthria - Normal + 0 11: Test Extinction/Inattention - No abnormality + 0  NIHSS Score: 0   Pre-Morbid Modified Rankin Scale: 3 Points = Moderate disability; requiring some help, but able to walk without assistance  Spoke with : Dr York Cerise  This consult was conducted in real time using interactive audio and Immunologist. Patient was informed of the technology being used for this visit and agreed to proceed. Patient located in hospital and provider located at home/office setting.   Patient is being evaluated for possible acute neurologic impairment and high probability of imminent or life-threatening deterioration. I spent total of 35 minutes providing care to this patient, including time for face to face visit via telemedicine, review of medical records, imaging studies and discussion of findings with providers, the patient and/or family.   Dr Lynnda Child   [facility] For Inpatient follow-up with [facility] physician please call [facility] [phone]. This is not an outpatient service. Post hospital discharge, please contact hospital directly.  Please do not communicate with  [facility] physicians via secure chat. If you have any questions, Please contact [facility]. Please call or reconsult our service if there are any clinical or diagnostic changes.

## 2022-11-08 NOTE — ED Provider Notes (Signed)
[facility] Provider Note    Event Date/Time   First MD Initiated Contact with Patient 11/08/22 0425     (approximate)   History   Extremity Weakness   HPI [name] [name] is a 89 y.o. male who had a stroke about 10 months ago and is on appropriate medication including blood thinners (Eliquis).  He presents by private vehicle with his wife for evaluation of worsening weakness or new onset weakness in his left foot and leg.  He said that he woke up to go to the bathroom and he had trouble walking and said that his leg felt funny and that he fell against a wall.  He said his left foot and leg feels like it is asleep and is somewhere but milder to the symptoms he had 10 months ago.  He has not had any difficulty breathing and no weakness in any of his other extremities, just the left foot/leg.  When he came to triage she was identified as a possible code stroke and code stroke was activated and the patient was sent to the CT scanner immediately.  I saw him after he had already been evaluated by teleneurology.     Physical Exam   ED Triage Vitals  Encounter Vitals Group     BP 11/08/22 0428 (!) 154/77     Systolic BP Percentile --      Diastolic BP Percentile --      Pulse Rate 11/08/22 0428 (!) 56     Resp 11/08/22 0428 20     Temp 11/08/22 0428 98 F (36.7 C)     Temp Source 11/08/22 0428 Oral     SpO2 11/08/22 0428 98 %     Weight 11/08/22 0419 93.9 kg (207 lb)     Height 11/08/22 0441 1.854 m (6\' 1" )     Head Circumference --      Peak Flow --      Pain Score --      Pain Loc --      Pain Education --      Exclude from Growth Chart --       Most recent vital signs: Vitals:   11/08/22 0600 11/08/22 0602  BP: (!) 168/64 (!) 150/57  Pulse: (!) 52   Resp: 17 19  Temp:    SpO2: 95% 94%    General: Awake, no distress.  Awake, alert, GCS 15. CV:  Good peripheral perfusion.  Regular rhythm, borderline bradycardia. Resp:  Normal effort.  Speaking easily and comfortably, no accessory muscle usage nor intercostal retractions.   Abd:  No distention.  Other:  NIH stroke scale of 0.  Patient's symptoms are improving.  He is not a candidate for TNKase given that he is on Eliquis and that his symptoms are very mild.  No dysarthria nor aphasia.  He only has some subjective numbness or sensation of his left foot being asleep.   ED Results / Procedures / Treatments   Labs (all labs ordered are listed, but only abnormal results are displayed) Labs Reviewed  CBC - Abnormal; Notable for the following components:      Result Value   RBC 3.72 (*)    Hemoglobin 12.4 (*)    HCT 35.7 (*)    Platelets 88 (*)    All other components within normal limits  COMPREHENSIVE METABOLIC PANEL - Abnormal; Notable for the following components:   Glucose, Bld 189 (*)    BUN 28 (*)  Creatinine, Ser 2.01 (*)    Total Protein 6.1 (*)    Alkaline Phosphatase 240 (*)    GFR, Estimated 32 (*)    All other components within normal limits  CBG MONITORING, ED - Abnormal; Notable for the following components:   Glucose-Capillary 172 (*)    All other components within normal limits  ETHANOL  PROTIME-INR  APTT  DIFFERENTIAL  URINE DRUG SCREEN, QUALITATIVE ([facility] ONLY)  URINALYSIS, ROUTINE W REFLEX MICROSCOPIC     EKG  ED ECG REPORT I, Loleta Rose, the attending physician, personally viewed and interpreted this ECG.  Date: 11/08/2022 EKG Time: 4:27 AM Rate: 57 Rhythm: Atrial fibrillation QRS Axis: normal Intervals: Abnormal due to atrial fibrillation, right bundle branch block ST/T Wave abnormalities: Non-specific ST segment / T-wave changes, but no clear evidence of acute ischemia. Narrative Interpretation: no definitive evidence of acute ischemia; does not meet STEMI criteria.    RADIOLOGY I viewed and interpreted the patient's CT head and see no evidence of intracranial bleed or other acute abnormality.  I also read the radiologist's  report, which confirmed no acute findings.   PROCEDURES:  Critical Care performed: Yes, see critical care procedure note(s)  .1-3 Lead EKG Interpretation  Performed by: Loleta Rose, MD Authorized by: Loleta Rose, MD     Interpretation: abnormal     ECG rate:  60   ECG rate assessment: normal     Rhythm: atrial fibrillation     Ectopy: none     Conduction: normal       IMPRESSION / MDM / ASSESSMENT AND PLAN / ED COURSE  I reviewed the triage vital signs and the nursing notes.                              Differential diagnosis includes, but is not limited to, CVA, TIA, metabolic or electrolyte abnormality, infectious process.  Patient's presentation is most consistent with acute presentation with potential threat to life or bodily function.  Labs/studies ordered: CT head, EKG, CBC, protime-INR And APTT to evaluate given possible stroke and the fact that the patient is on anticoagulants, differential  Interventions/Medications given:  Medications  sodium chloride 0.9 % bolus 500 mL (0 mLs Intravenous Stopped 11/08/22 0607)    (Note:  hospital course my include additional interventions and/or labs/studies not listed above.)   Minimal deficits at this point, I doubt acute CVA.  NIH stroke scale is 0.  Recommendations from neuro are pending.  Labs notable for an AKI with a creatinine of 2 where his baseline is 1.5, otherwise generally reassuring and unremarkable.  No urine specimen at this time.  CT head is negative.  The patient is on the cardiac monitor to evaluate for evidence of arrhythmia and/or significant heart rate changes.   Clinical Course as of 11/08/22 0615  Tue Nov 08, 2022  1610 Consulted with the teleneurologist who evaluated the patient.  He feels that this is not likely to represent an acute CVA but rather that an acute metabolic or infectious process is unmasking or worsening his prior symptoms.  We discussed the fact that the patient has a bit of an  AKI tonight likely due to overdiuresis and that his creatinine is currently 2 when his baseline 1.5.  The neurologist feels it is very likely that that could be enough to cause some worsening neurological symptoms.  The current plan is to hydrate him with a 500 mL liter normal saline  fluid bolus and then check orthostatics and reassess his overall state.  If he requires an MRI at that point we can proceed but otherwise he may be appropriate for outpatient follow-up. [CF]  [phone] The patient said he feels much better after the IV fluids.  His symptoms have essentially resolved or gone back to baseline.  He is able to ambulate at his baseline.  He does not want the MRI, which I offered again.  He is comfortable with the plan for discharge, rehydration, and close outpatient follow-up.  His wife is also comfortable with this.  I gave my usual and customary return precautions. [CF]    Clinical Course User Index [CF] Loleta Rose, MD     FINAL CLINICAL IMPRESSION(S) / ED DIAGNOSES   Final diagnoses:  Acute kidney injury (HCC)  Numbness     Rx / DC Orders   ED Discharge Orders     None        Note:  This document was prepared using Dragon voice recognition software and may include unintentional dictation errors.   Loleta Rose, MD 11/08/22 [phone]

## 2022-11-16 ENCOUNTER — Ambulatory Visit (INDEPENDENT_AMBULATORY_CARE_PROVIDER_SITE_OTHER): Payer: Medicare Other | Admitting: Family Medicine

## 2022-11-16 ENCOUNTER — Encounter: Payer: Self-pay | Admitting: Family Medicine

## 2022-11-16 VITALS — BP 136/84 | HR 57 | Temp 98.3°F | Ht 73.0 in | Wt 210.0 lb

## 2022-11-16 DIAGNOSIS — N179 Acute kidney failure, unspecified: Secondary | ICD-10-CM | POA: Diagnosis not present

## 2022-11-16 DIAGNOSIS — S61411A Laceration without foreign body of right hand, initial encounter: Secondary | ICD-10-CM

## 2022-11-16 DIAGNOSIS — M545 Low back pain, unspecified: Secondary | ICD-10-CM

## 2022-11-16 DIAGNOSIS — G8929 Other chronic pain: Secondary | ICD-10-CM

## 2022-11-16 DIAGNOSIS — R531 Weakness: Secondary | ICD-10-CM | POA: Diagnosis not present

## 2022-11-16 DIAGNOSIS — N183 Chronic kidney disease, stage 3 unspecified: Secondary | ICD-10-CM | POA: Diagnosis not present

## 2022-11-16 DIAGNOSIS — I10 Essential (primary) hypertension: Secondary | ICD-10-CM

## 2022-11-16 DIAGNOSIS — E1122 Type 2 diabetes mellitus with diabetic chronic kidney disease: Secondary | ICD-10-CM | POA: Diagnosis not present

## 2022-11-16 NOTE — Patient Instructions (Signed)
Please let me know if PT is too expensive.

## 2022-11-16 NOTE — Assessment & Plan Note (Signed)
Chronic issue.  Refer for physical therapy.  Patient can continue Tylenol over-the-counter.

## 2022-11-16 NOTE — Assessment & Plan Note (Signed)
Patient with small wound to the dorsal right index finger.  Advised to monitor for signs of infection.  Advised he can keep this covered at night to prevent bleeding.

## 2022-11-16 NOTE — Assessment & Plan Note (Signed)
Chronic issue.  Seems improved.  He will continue [place] 25 units daily, sliding scale Humalog, and Farxiga 10 mg daily.  He will continue to see endocrinology.

## 2022-11-16 NOTE — Progress Notes (Signed)
Marikay Alar, MD Phone: [phone]  [name] [name] is a 89 y.o. male who presents today for f/u.  Diabetes: Typically 80-130.  He is seeing endocrinology and they have him on sliding scale insulin which has helped with elevations later in the day.  He is on [place] as well.  No hypoglycemia.  Most recent A1c 6.8.  Hypertension: Home readings are typically in the 160s-190 systolic.  His wife notes he checks his blood pressure before taking his medication.  He is on carvedilol, Cardura, hydralazine, and Imdur.  No chest pain, shortness of breath, or edema.  Chronic back pain: Patient reports chronic issue.  He occasionally takes Tylenol without much benefit.  He has had tramadol in the past without much benefit.  He stays active to a certain degree with riding his bike 3-4 times a week though mostly sits around otherwise.  Patient with abrasion to right index finger.  Notes he was trying to swat a fly and hit the blinds.  Notes there is a small cut in this area.  Left leg weakness/numbness: Patient notes he went to the emergency room for this.  He had difficulty bearing weight on his left leg after waking up to go to the bathroom.  He noted that his left foot and left leg felt like it was asleep.  He had a negative CT head in the ED.  He was dehydrated on his labs.  By the end of his ED visit his symptoms had essentially returned to baseline.  He has not had any recurrence of symptoms.  Teleneurology did not feel as though this represented an acute CVA.  Social History   Tobacco Use  Smoking Status Never  Smokeless Tobacco Never    Current Outpatient Medications on File Prior to Visit  Medication Sig Dispense Refill   acetaminophen (TYLENOL) 325 MG tablet Take by mouth.     amiodarone (PACERONE) 200 MG tablet Take 200 mg by mouth daily.     azelastine (ASTELIN) 0.1 % nasal spray Place 2 sprays into both nostrils 2 (two) times daily. Use in each nostril as directed 30 mL 2   BD PEN  NEEDLE NANO U/F 32G X 4 MM MISC USE AS DIRECTED 100 each 0   carvedilol (COREG) 12.5 MG tablet TAKE ONE (1) TABLET BY MOUTH TWO TIMES PER DAY (Patient taking differently: Take 12.5 mg by mouth 2 (two) times daily with a meal.) 60 tablet 0   Cholecalciferol (VITAMIN D3) 50 MCG (2000 UT) TABS Take 2,000 Units by mouth in the morning.     clotrimazole (LOTRIMIN) 1 % cream Apply 1 Application topically 2 (two) times daily.     dapagliflozin propanediol (FARXIGA) 10 MG TABS tablet Take by mouth.     doxazosin (CARDURA) 2 MG tablet Take 1 tablet (2 mg total) by mouth 2 (two) times daily. Please keep upcoming appt for future refills 180 tablet 0   ezetimibe (ZETIA) 10 MG tablet TAKE 1 TABLET BY MOUTH DAILY 90 tablet 0   finasteride (PROSCAR) 5 MG tablet Take 1 tablet (5 mg total) by mouth daily. (Patient taking differently: Take 5 mg by mouth every evening.) 90 tablet 1   furosemide (LASIX) 20 MG tablet Take 20 mg by mouth. Patient states he takes as needed depending on weight gain as recommended by provider     gabapentin (NEURONTIN) 100 MG capsule Take 100 mg by mouth 3 (three) times daily.     HUMALOG KWIKPEN 100 UNIT/ML KwikPen Inject into the  skin.     hydrALAZINE (APRESOLINE) 25 MG tablet Take 1 tablet (25 mg total) by mouth 3 (three) times daily. 270 tablet 3   insulin degludec (TRESIBA FLEXTOUCH) 100 UNIT/ML FlexTouch Pen Inject into the skin daily. Patient takes 28-30 units 1 x per day. 115-125 takes 28 units, > 125 can take 29-30 units     insulin glargine, 1 Unit Dial, (TOUJEO) 300 UNIT/ML Solostar Pen Inject 40 Units into the skin daily. 4.5 mL 3   isosorbide mononitrate (IMDUR) 30 MG 24 hr tablet Take 1 tablet (30 mg total) by mouth daily. Please call [phone] for a sooner appointment and for further refills. 30 tablet 0   loperamide (IMODIUM A-D) 2 MG tablet Take 1 tablet (2 mg total) by mouth 3 (three) times daily as needed for diarrhea or loose stools. 30 tablet 0   Multiple  Vitamins-Minerals (PRESERVISION AREDS 2 PO) Take 1 tablet by mouth in the morning and at bedtime.     omeprazole (PRILOSEC) 20 MG capsule TAKE 1 CAPSULE BY MOUTH TWO TIMES DAILY 60 capsule 1   ondansetron (ZOFRAN-ODT) 4 MG disintegrating tablet Take 1 tablet (4 mg total) by mouth every 8 (eight) hours as needed for nausea or vomiting. 20 tablet 0   potassium chloride (KLOR-CON) 10 MEQ tablet Take 1 tablet (10 mEq total) by mouth daily as needed. Take with torsemide 90 tablet 3   TOUJEO SOLOSTAR 300 UNIT/ML Solostar Pen Inject 10 Units into the skin daily. 22.5 mL 6   apixaban (ELIQUIS) 2.5 MG TABS tablet Take 1 tablet (2.5 mg total) by mouth 2 (two) times daily. 120 tablet 0   No current facility-administered medications on file prior to visit.     ROS see history of present illness  Objective  Physical Exam Vitals:   11/16/22 1520  BP: 136/84  Pulse: (!) 57  Temp: 98.3 F (36.8 C)  SpO2: 98%    BP Readings from Last 3 Encounters:  11/16/22 136/84  11/08/22 (!) 150/57  08/05/22 122/60   Wt Readings from Last 3 Encounters:  11/16/22 210 lb (95.3 kg)  11/08/22 207 lb 0.2 oz (93.9 kg)  08/05/22 212 lb (96.2 kg)    Physical Exam Constitutional:      General: He is not in acute distress.    Appearance: He is not diaphoretic.  Cardiovascular:     Rate and Rhythm: Normal rate and regular rhythm.     Heart sounds: Normal heart sounds.  Pulmonary:     Effort: Pulmonary effort is normal.     Breath sounds: Normal breath sounds.  Musculoskeletal:     Comments: Very small skin tear over the dorsal aspect of his right PIP joint index finger, no surrounding signs of infection  Skin:    General: Skin is warm and dry.  Neurological:     Mental Status: He is alert.     Comments: 5/5 strength bilateral quads, hamstrings, plantarflexion, and dorsiflexion, sensation light touch intact bilateral lower extremities      Assessment/Plan: Please see individual problem list.  Chronic  low back pain without sciatica, unspecified back pain laterality Assessment & Plan: Chronic issue.  Refer for physical therapy.  Patient can continue Tylenol over-the-counter.  Orders: -     Ambulatory referral to Physical Therapy  AKI (acute kidney injury) (HCC) -     Basic metabolic panel  Weakness Assessment & Plan: Reported weakness of left leg and went to the emergency department for evaluation.  Patient had reassuring workup  except for dehydration.  Lower extremity strength is intact today.  Discussed possibility that he could have slept on it in an odd manner and had some nerve impingement.  Patient will monitor.  Will recheck kidney function as well.   Laceration of right hand without foreign body, initial encounter Assessment & Plan: Patient with small wound to the dorsal right index finger.  Advised to monitor for signs of infection.  Advised he can keep this covered at night to prevent bleeding.   Type 2 diabetes mellitus with stage 3 chronic kidney disease, without long-term current use of insulin, unspecified whether stage 3a or 3b CKD (HCC) Assessment & Plan: Chronic issue.  Seems improved.  He will continue [place] 25 units daily, sliding scale Humalog, and Farxiga 10 mg daily.  He will continue to see endocrinology.   Essential hypertension Assessment & Plan: Chronic issue.  Adequately controlled today.  Advised to check his blood pressure at least 2 hours after taking his medication each day.  He will continue carvedilol 12.5 mg twice daily, Cardura 2 mg twice daily, hydralazine 25 mg 3 times daily, and Imdur 30 mg daily.     Return in about 3 months (around 02/15/2023).   Marikay Alar, MD [facility]

## 2022-11-16 NOTE — Assessment & Plan Note (Signed)
Chronic issue.  Adequately controlled today.  Advised to check his blood pressure at least 2 hours after taking his medication each day.  He will continue carvedilol 12.5 mg twice daily, Cardura 2 mg twice daily, hydralazine 25 mg 3 times daily, and Imdur 30 mg daily.

## 2022-11-16 NOTE — Assessment & Plan Note (Signed)
Reported weakness of left leg and went to the emergency department for evaluation.  Patient had reassuring workup except for dehydration.  Lower extremity strength is intact today.  Discussed possibility that he could have slept on it in an odd manner and had some nerve impingement.  Patient will monitor.  Will recheck kidney function as well.

## 2022-11-17 LAB — BASIC METABOLIC PANEL
BUN: 29 mg/dL — ABNORMAL HIGH (ref 6–23)
CO2: 23 meq/L (ref 19–32)
Calcium: 8.7 mg/dL (ref 8.4–10.5)
Chloride: 108 meq/L (ref 96–112)
Creatinine, Ser: 2.26 mg/dL — ABNORMAL HIGH (ref 0.40–1.50)
GFR: 25.52 mL/min — ABNORMAL LOW (ref >60.00)
Glucose, Bld: 170 mg/dL — ABNORMAL HIGH (ref 70–99)
Potassium: 4.5 meq/L (ref 3.5–5.1)
Sodium: 140 meq/L (ref 135–145)

## 2022-11-22 ENCOUNTER — Ambulatory Visit: Payer: Self-pay

## 2022-11-22 NOTE — Patient Instructions (Signed)
Visit Information  Thank you for taking time to visit with me today. Please don't hesitate to contact me if I can be of assistance to you.   Following are the goals we discussed today:   Goals Addressed             This Visit's Progress    Development of plan of care for management of diabetes and new onset shortness of breath/ health conditions       Interventions Today    Flowsheet Row Most Recent Value  Chronic Disease   Chronic disease during today's visit Diabetes, Congestive Heart Failure (CHF), Atrial Fibrillation (AFib)  [left lower leg blister]  General Interventions   General Interventions Discussed/Reviewed General Interventions Reviewed, Doctor Visits  [evaluation of current treatment plan for mentioned conditions and patients adherence to plan as established by provider. Assessed for HF, Atrial fibrillation symptoms, BP readings, and signs of infection of left lower extremity blister.]  Doctor Visits Discussed/Reviewed Doctor Visits Reviewed  [discussed 11/16/22 primary care provider visit.  Reviewed upcoming provider visits. Advised to keep follow up visits with providers.]  Education Interventions   Education Provided Provided Education  [Advised to notify provider of bounding heartbeat sound in ears. Advised to take meclizine as prescribed for dizziness and report unrelieved/ ongoing dizziness symptoms to provider. Advised to report atrial fibrillation, HF, symptoms ot provider.]  Provided Verbal Education On Blood Sugar Monitoring  Nutrition Interventions   Nutrition Discussed/Reviewed Nutrition Reviewed, Decreasing sugar intake, Decreasing salt, Carbohydrate meal planning  Pharmacy Interventions   Pharmacy Dicussed/Reviewed Pharmacy Topics Reviewed  [medications reviewed and medication compliance advised.]  Safety Interventions   Safety Discussed/Reviewed Fall Risk  [assessed for falls due to dizziness. Advised to continue rising slow from laying to sitting/ sitting to  standing.]                 Our next appointment is by telephone on 01/13/23 at 11 am  Please call the care guide team at [phone] if you need to cancel or reschedule your appointment.   If you are experiencing a Mental Health or Behavioral Health Crisis or need someone to talk to, please call the Suicide and Crisis Lifeline: 988 call 1-800-273-TALK (toll free, 24 hour hotline)  Patient verbalizes understanding of instructions and care plan provided today and agrees to view in MyChart. Active MyChart status and patient understanding of how to access instructions and care plan via MyChart confirmed with patient.     George Ina RN,BSN,CCM [facility] Care Coordination [phone] direct line

## 2022-11-22 NOTE — Patient Outreach (Signed)
Care Coordination   Follow Up Visit Note   11/22/2022 Name: [name] [name] MRN: [id number] DOB: 02/18/1934  [name] [name] is a 89 y.o. year old male who sees [name], [name], MD for primary care. I spoke with  [name] [name] by phone today.  What matters to the patients health and wellness today?  Patient reports having follow up visit with primary care provider on 11/16/22.  He reports having mild dizziness in the morning when he first wakes up over the last several days.  Patient reports having a history of vertigo and states he takes meclizine as treatment.  Reports taking meclizine a few days ago and has noticed some relief.   Patient reports blood pressures continue to range from 160's/70's to 199/90.  He reports he continues to take his blood pressure medications as well as all of his medications as prescribed.  Patient reports fasting blood sugars range from 80's to 140's and late afternoon/ after dinner range 200-240's.  Patient states he uses his sliding scale insulin later in the day as advised/ prescribed by his provider.  Patient reports blister to lower left leg has dried up/ healed, no open wound.   Patient states he can hear his heart beating in his ears when he wakes up in the morning. He states he has noticed this over several months.     Goals Addressed             This Visit's Progress    Development of plan of care for management of diabetes and new onset shortness of breath/ health conditions       Interventions Today    Flowsheet Row Most Recent Value  Chronic Disease   Chronic disease during today's visit Diabetes, Congestive Heart Failure (CHF), Atrial Fibrillation (AFib)  [left lower leg blister]  General Interventions   General Interventions Discussed/Reviewed General Interventions Reviewed, Doctor Visits  [evaluation of current treatment plan for mentioned conditions and patients adherence to plan as established by provider. Assessed for HF, Atrial  fibrillation symptoms, BP readings, and signs of infection of left lower extremity blister.]  Doctor Visits Discussed/Reviewed Doctor Visits Reviewed  [discussed 11/16/22 primary care provider visit.  Reviewed upcoming provider visits. Advised to keep follow up visits with providers.]  Education Interventions   Education Provided Provided Education  [Advised to notify provider of bounding heartbeat sound in ears. Advised to take meclizine as prescribed for dizziness and report unrelieved/ ongoing dizziness symptoms to provider. Advised to report atrial fibrillation, HF, symptoms ot provider.]  Provided Verbal Education On Blood Sugar Monitoring  Nutrition Interventions   Nutrition Discussed/Reviewed Nutrition Reviewed, Decreasing sugar intake, Decreasing salt, Carbohydrate meal planning  Pharmacy Interventions   Pharmacy Dicussed/Reviewed Pharmacy Topics Reviewed  [medications reviewed and medication compliance advised.]  Safety Interventions   Safety Discussed/Reviewed Fall Risk  [assessed for falls due to dizziness. Advised to continue rising slow from laying to sitting/ sitting to standing.]                 SDOH assessments and interventions completed:  No     Care Coordination Interventions:  Yes, provided   Follow up plan: Follow up call scheduled for 01/13/23    Encounter Outcome:  Patient Visit Completed   George Ina RN,BSN,CCM [facility] Care Coordination [phone] direct line

## 2022-11-26 ENCOUNTER — Other Ambulatory Visit: Payer: Self-pay | Admitting: Family Medicine

## 2022-11-29 ENCOUNTER — Ambulatory Visit: Payer: Medicare Other | Admitting: Physical Therapy

## 2022-11-29 ENCOUNTER — Telehealth: Payer: Self-pay

## 2022-11-29 NOTE — Telephone Encounter (Signed)
Transition Care Management Follow-up Telephone Call Date of discharge and from where: [name] [name] 9/3 How have you been since you were released from the hospital? Doing ok and has followed up with PCP Any questions or concerns? No  Items Reviewed: Did the pt receive and understand the discharge instructions provided? Yes  Medications obtained and verified? No  Other? No  Any new allergies since your discharge? No  Dietary orders reviewed? No Do you have support at home? Yes     Follow up appointments reviewed:  PCP Hospital f/u appt confirmed? Yes  Scheduled to see  on  @ . Specialist Hospital f/u appt confirmed? No  Scheduled to see  on  @ . Are transportation arrangements needed? No  If their condition worsens, is the pt aware to call PCP or go to the Emergency Dept.? Yes Was the patient provided with contact information for the PCP's office or ED? Yes Was to pt encouraged to call back with questions or concerns? Yes

## 2022-11-30 ENCOUNTER — Telehealth: Payer: Self-pay | Admitting: Physical Therapy

## 2022-11-30 NOTE — Telephone Encounter (Signed)
Please follow-up with the patient and see if he is taking torsemide or lasix. Lasix is currently on his list and torsemide is not.

## 2022-11-30 NOTE — Telephone Encounter (Signed)
Called pt to notify him about increased availability to see him sooner for eval. He did not pickup so left VM instructing pt to call back if he was interested in coming today.

## 2022-12-01 ENCOUNTER — Ambulatory Visit: Payer: Medicare Other | Admitting: Physical Therapy

## 2022-12-05 ENCOUNTER — Telehealth: Payer: Self-pay | Admitting: Family Medicine

## 2022-12-05 DIAGNOSIS — N179 Acute kidney failure, unspecified: Secondary | ICD-10-CM

## 2022-12-05 NOTE — Telephone Encounter (Signed)
Ordered

## 2022-12-05 NOTE — Telephone Encounter (Signed)
Patient need lab orders.

## 2022-12-06 ENCOUNTER — Encounter: Payer: Self-pay | Admitting: Physical Therapy

## 2022-12-06 ENCOUNTER — Ambulatory Visit: Payer: Medicare Other | Attending: Family Medicine | Admitting: Physical Therapy

## 2022-12-06 ENCOUNTER — Encounter: Payer: Medicare Other | Admitting: Physical Therapy

## 2022-12-06 ENCOUNTER — Other Ambulatory Visit: Payer: Medicare Other

## 2022-12-06 DIAGNOSIS — M5459 Other low back pain: Secondary | ICD-10-CM | POA: Insufficient documentation

## 2022-12-06 DIAGNOSIS — M545 Low back pain, unspecified: Secondary | ICD-10-CM | POA: Diagnosis not present

## 2022-12-06 DIAGNOSIS — G8929 Other chronic pain: Secondary | ICD-10-CM | POA: Insufficient documentation

## 2022-12-06 NOTE — Therapy (Addendum)
OUTPATIENT PHYSICAL THERAPY THORACOLUMBAR EVALUATION   Discharge Summary: Pt has decided to self discharge and complete home exercises on his own. He has not met any of his rehab goals.  Patient Name: [name] [name] MRN: [id number] DOB:05-Jun-1933, 89 y.o., male Today's Date: 12/06/2022  END OF SESSION:  PT End of Session - 12/06/22 1520     Visit Number 1    Number of Visits 20    Date for PT Re-Evaluation 02/14/23    Authorization Type Medicare 2024    Authorization Time Period [place]    Authorization - Visit Number 1    Authorization - Number of Visits 20    Progress Note Due on Visit 10    PT Start Time 1430    PT Stop Time 1515    PT Time Calculation (min) 45 min    Activity Tolerance Patient limited by pain    Behavior During Therapy [facility] for tasks assessed/performed             Past Medical History:  Diagnosis Date   (HFpEF) heart failure with preserved ejection fraction (HCC)    a. 06/2007 Echo: nl systolic fxn, mild LVH, diastolic relaxation abnormality, mildly enlarged LA, mild Ao insufficiency; 01/2018 Echo: EF 55-60%, no rwma, GR2 DD, triv AI, mildly dil LA, mild inc PASP.   AMS (altered mental status) 02/03/2021   BPH (benign prostatic hyperplasia)    Cancer (HCC)    skin   Cataract    Choledocholithiasis    a. 05/2020 ERCP w/ CBD stone removal and biliary sphincterotomy.   Coronary artery disease    a. 01/2005 s/p CABG x 4: LIMA-LAD, VG-Diag, VG-OM3, VG-dRCA; b. 06/2013 Cath: patent grafts; c. 09/2015 Cath: VG->Diag 100, other grafts patent. LAD 100, RCA sev dzs->Med rx; d. 01/2018 Cath: LAD 100ost/p, LCX 50, RCA 100p/d, VG->OM3 100, VG->Diag 100, LIMA->LAD ok, VG->RPAV 38m-->Med rx.   COVID-19    07/2020 had paxlovid    Dyspnea    with exertion   Dysrhythmia    ED (erectile dysfunction)    Elevated LFTs    a. 05/2020 following CBD stone. 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   GERD (gastroesophageal reflux  disease)    Hyperlipidemia    Hypertension    IDDM (insulin dependent diabetes mellitus) 2000   Osteoarthritis    PAH (pulmonary artery hypertension) (HCC)    a. 01/2018 RHC: PA 51/22 (32).   Perirectal fistula    Pneumonia 12/2016   Shingles 09/04/2018   Tubular adenoma of colon 07/2012   Vertigo    Past Surgical History:  Procedure Laterality Date   BACK SURGERY     CARDIAC CATHETERIZATION  06/05/2013   [facility]    CARDIAC CATHETERIZATION N/A 09/24/2015   Procedure: Right Heart Cath and Coronary/Graft Angiography;  Surgeon: Antonieta Iba, MD;  Location: [facility] INVASIVE CV LAB;  Service: Cardiovascular;  Laterality: N/A;   CATARACT EXTRACTION  sept 2013   right   CATARACT EXTRACTION W/PHACO Left 03/28/2017   Procedure: CATARACT EXTRACTION PHACO AND INTRAOCULAR LENS PLACEMENT (IOC);  Surgeon: Galen Manila, MD;  Location: [facility] ORS;  Service: Ophthalmology;  Laterality: Left;  [place] 00:42.0 AP% 15.0 CDE 6.31 Fluid Pack lot # [id number]   CHOLECYSTECTOMY  05/15/2011   Procedure: LAPAROSCOPIC CHOLECYSTECTOMY;  Surgeon: Emelia Loron, MD;  Location: [facility] OR;  Service: General;  Laterality: N/A;   COLONOSCOPY W/ POLYPECTOMY     CORONARY ARTERY BYPASS GRAFT  2007  x 4   CORONARY STENT INTERVENTION N/A 12/08/2020   Procedure: CORONARY STENT INTERVENTION;  Surgeon: Yvonne Kendall, MD;  Location: [facility] INVASIVE CV LAB;  Service: Cardiovascular;  Laterality: N/A;   ERCP N/A 05/05/2020   Procedure: ENDOSCOPIC RETROGRADE CHOLANGIOPANCREATOGRAPHY (ERCP);  Surgeon: Midge Minium, MD;  Location: [facility] ENDOSCOPY;  Service: Endoscopy;  Laterality: N/A;   ERCP N/A 05/12/2020   Procedure: ENDOSCOPIC RETROGRADE CHOLANGIOPANCREATOGRAPHY (ERCP);  Surgeon: Midge Minium, MD;  Location: [facility] ENDOSCOPY;  Service: Endoscopy;  Laterality: N/A;   ESOPHAGOGASTRODUODENOSCOPY N/A 05/12/2020   Procedure: ESOPHAGOGASTRODUODENOSCOPY (EGD);  Surgeon: Midge Minium, MD;  Location: [facility] ENDOSCOPY;  Service: Endoscopy;   Laterality: N/A;   EYE SURGERY     FOOT SURGERY  2012   right foot   JOINT REPLACEMENT  8/10   Right THR--[name]   LEFT AND RIGHT HEART CATHETERIZATION WITH CORONARY/GRAFT ANGIOGRAM N/A 06/05/2013   Procedure: LEFT AND RIGHT HEART CATHETERIZATION WITH Isabel Caprice;  Surgeon: Peter M [place]az[place]and, MD;  Location: [facility] CATH LAB;  Service: Cardiovascular;  Laterality: N/A;   LUMBAR LAMINECTOMY  1989   Prostate photovaporization  5/16   Dr Raeanne Gathers   RIGHT/LEFT HEART CATH AND CORONARY/GRAFT ANGIOGRAPHY N/A 01/05/2018   Procedure: RIGHT/LEFT HEART CATH AND CORONARY/GRAFT ANGIOGRAPHY;  Surgeon: Antonieta Iba, MD;  Location: [facility] INVASIVE CV LAB;  Service: Cardiovascular;  Laterality: N/A;   RIGHT/LEFT HEART CATH AND CORONARY/GRAFT ANGIOGRAPHY Bilateral 12/08/2020   Procedure: RIGHT/LEFT HEART CATH AND CORONARY/GRAFT ANGIOGRAPHY;  Surgeon: Yvonne Kendall, MD;  Location: [facility] INVASIVE CV LAB;  Service: Cardiovascular;  Laterality: Bilateral;   Patient Active Problem List   Diagnosis Date Noted   Wound of left leg 08/10/2022   Allergic rhinitis 05/18/2022   Anxiety 04/01/2022   Type 2 diabetes mellitus with complication, without long-term current use of insulin (HCC) 04/01/2022   Gastroenteritis 03/23/2022   Rash 03/23/2022   Acute respiratory failure with hypoxia (HCC) 03/14/2022   Nausea and vomiting in adult 03/14/2022   Bronchitis 03/10/2022   Laceration of right hand without foreign body 02/07/2022   Acute CVA (cerebrovascular accident) (HCC) 01/26/2022   Atrial fibrillation, chronic (HCC) 01/26/2022   Atrial flutter (HCC) 11/05/2021   Chronic anticoagulation 11/05/2021   Acute cough 04/08/2021   Weakness 02/03/2021   De Quervain's tenosynovitis, left 10/05/2020   Abnormal MRI, lumbar spine 08/12/2020   History of COVID-19 07/22/2020   AKI (acute kidney injury) (HCC) 04/08/2020   Scrotal lesion 03/12/2020   Postherpetic neuralgia 05/09/2019   B12 deficiency 05/09/2019    Pulmonary hypertension, unspecified (HCC) 04/05/2019   Atypical chest pain 01/08/2019   Weight loss 12/01/2018   Diabetic retinopathy (HCC) 09/20/2018   Chronic low back pain 03/05/2018   Abnormal breast exam 01/18/2018   PAC (premature atrial contraction) 01/18/2018   Neuropathic pain 01/18/2018   Acute kidney injury superimposed on chronic kidney disease (HCC) 01/07/2018   Bell's palsy 08/18/2017   H/O cold sores 08/18/2017   Insomnia 11/10/2016   PLMD (periodic limb movement disorder) 08/17/2016   Mild OSA 08/17/2016   Intolerance of continuous positive airway pressure (CPAP) ventilation 08/17/2016   Chronic arthritis 06/02/2016   CKD stage 3 due to type 2 diabetes mellitus (HCC) 06/02/2016   Diastolic dysfunction 05/04/2016   Chronic pain of both knees 10/06/2015   Spondylosis of lumbar region without myelopathy or radiculopathy 10/06/2015   S/P CABG x 4    Thrombocytopenia (HCC) 02/07/2015   DM2 (diabetes mellitus, type 2) (HCC) 02/06/2015   Chronic diastolic CHF (congestive heart failure) (HCC)  01/01/2014   CAD (coronary artery disease) 06/03/2013   Diabetic neuropathy (HCC) 11/12/2012   BPH (benign prostatic hyperplasia)    GERD (gastroesophageal reflux disease)    Hyperlipidemia 09/15/2009   Essential hypertension 09/15/2009    PCP: Dr. Marikay Alar   REFERRING PROVIDER: Dr. Marikay Alar  REFERRING DIAG: M54.50,G89.29 (ICD-10-CM) - Chronic low back pain without sciatica, unspecified back pain laterality  Rationale for Evaluation and Treatment: Rehabilitation  THERAPY DIAG:  Other low back pain  ONSET DATE: 1989 after low back surgery   SUBJECTIVE:                                                                                                                                                                                           SUBJECTIVE STATEMENT: See pertinent history   PERTINENT HISTORY:  Pt reports having a L4 laminectomy in the 80's and he  has since had back issues ever since. He experiences most of his pain while standing and for long periods of time and when he lays on his back. He believes that he also has arthritis but he is not sure. He treats pain with Tylenol and he occasionally takes hydrocodone if it becomes bad enough. Pt cycles multiple times a week for physical activity.   PAIN:  Are you having pain? Yes: NPRS scale: 7-8/10 Pain location: Central Spinous process of L4  Pain description: Throbbing  Aggravating factors: Standing for prolonged periods of time   Relieving factors: Tylenol and Gabapentin   PRECAUTIONS: None  RED FLAGS: Bowel or bladder incontinence: No and Cauda equina syndrome: No   WEIGHT BEARING RESTRICTIONS: No  FALLS:  Has patient fallen in last 6 months? 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  VITALS: BP 118/42 HR 56 SpO2 97%  DIAGNOSTIC FINDINGS:  None listed in his chart   PATIENT SURVEYS:  FOTO 40/100 with target of 49   SCREENING FOR RED FLAGS: Bowel or bladder incontinence: No Spinal tumors: No Cauda equina syndrome: No Compression fracture: No Abdominal aneurysm: No  COGNITION: Overall cognitive status: Within functional limits for tasks assessed     SENSATION: WFL  he describes numbness in  his LLE and he thinks this is from a prior stroke in November of 2023   MUSCLE LENGTH: Hamstrings: Right 70 deg; Left 70  deg Thomas test: Right 10 deg; Left 10 deg Ely's Test: + Bilaterally   POSTURE:  No Significant postural limitations  PALPATION: L4 Central Spinous Process   LUMBAR ROM:   AROM eval  Flexion 80%  Extension 80%*  Right lateral flexion 75%*  Left lateral flexion 75%  Right rotation 75%*  Left rotation 75%   (Blank rows = not tested)  LOWER EXTREMITY ROM:     Active  Right eval Left eval  Hip flexion    Hip extension    Hip abduction    Hip adduction    Hip internal rotation    Hip external rotation    Knee flexion    Knee extension    Ankle dorsiflexion    Ankle plantarflexion    Ankle inversion    Ankle eversion     (Blank rows = not tested)  LOWER EXTREMITY MMT:    MMT Right eval Left eval  Hip flexion 4 4-  Hip extension    Hip abduction    Hip adduction    Hip internal rotation    Hip external rotation    Knee flexion 4+ 4+  Knee extension 4+ 4+  Ankle dorsiflexion    Ankle plantarflexion    Ankle inversion    Ankle eversion     (Blank rows = not tested)  LUMBAR SPECIAL TESTS:  Straight leg raise test: Negative bilaterally , FABER test: Positive, and FADIR Positive bilaterally   FUNCTIONAL TESTS:  5 times sit to stand: Only able to complete 2 reps  -Pt reports increased right knee pain and low back pain  30 seconds chair stand test: 2 reps   GAIT: Distance walked: 50 ft  Assistive device utilized: None Level of assistance: Complete Independence Comments: Decreased stride length and sin  TODAY'S TREATMENT:                                                                                                                              DATE:   12/06/22 (Eval): Sit to Stand from 18 inch mat height with 1 UE support  1 x 5   Sit to Stand from 23 inch mat height with 1 UE support 1 x 10  -Pt reports decreased right knee pain  Side Lying Quad Strength 2 x 30 sec    PATIENT EDUCATION:  Education details: Form and technique for correct performance of exercise  Person educated: Patient Education method: Explanation,  Demonstration, Verbal cues, and Handouts Education comprehension: verbalized understanding, returned demonstration, verbal cues required, and tactile cues required  HOME EXERCISE PROGRAM: Access Code: [email or web address] URL: [email or web address] Date: 12/06/2022 Prepared by: Ellin Goodie  Exercises - Sidelying Quadriceps Stretch with Strap  - 1 x daily - 3 reps - 60 sec  hold - Sit to Stand with Counter Support  -  3-4 x weekly - 2 sets - 10 reps - Seated Hamstring Stretch  - 1 x daily - 3 reps - 60 sec hold  ASSESSMENT:  CLINICAL IMPRESSION: Patient is an 89 y.o. white male who was seen today for physical therapy evaluation and treatment for chronic low back pain. He shows signs and symptoms that place him in the movement control group with moderate pain and disability. Pt also shows deficits that include decreased hip flexibility and hip strength along with decrease LE strength and endurance that place him at increase risk for falls. Pt also has increase low back pain with extension. He will benefit from skilled PT to address the aforementioned deficits to decrease pt's risk of falling so he can continue to function independently and maintain his quality of life.   OBJECTIVE IMPAIRMENTS: decreased balance, difficulty walking, decreased ROM, decreased strength, hypomobility, impaired flexibility, obesity, and pain.   ACTIVITY LIMITATIONS: carrying, lifting, bending, standing, squatting, and caring for others  PARTICIPATION LIMITATIONS: shopping, community activity, occupation, yard work and sleeping   PERSONAL FACTORS: Age, Fitness, Past/current experiences, Time since onset of injury/illness/exacerbation, and 3+ comorbidities: CAD,   are also affecting patient's functional outcome.   REHAB POTENTIAL: Good  CLINICAL DECISION MAKING: Stable/uncomplicated  EVALUATION COMPLEXITY: Low   GOALS: Goals reviewed with patient? No  SHORT TERM GOALS: Target date: 12/20/2022  PT  reviewed the following HEP with patient with patient able to demonstrate a set of the following with min cuing for correction needed. PT educated patient on parameters of therex (how/when to inc/decrease intensity, frequency, rep/set range, stretch hold time, and purpose of therex) with verbalized understanding.  Baseline: NT  Goal status: NOT MET   2.  Patient will perform sit to stand without use of upper extremity support from standard chair height (18") as evidence of improved LE strength and to decrease his risk for falling.  Baseline: Needs to use UE support to perform sit to stand  Goal status: NOT MET     LONG TERM GOALS: Target date: 02/14/2023  Patient will have improved function and activity level as evidenced by an increase in FOTO score by 10 points or more.  Baseline: 40 with target of 49   Goal status: NOT MET   2.  Patient will perform five sit to stand repetitions in <=15 secs as evidence of LE strength that shows that this community dwelling older adult that is older is at a decreased risk of falling. (Buatois 2010)   Baseline: Only able to perform 2 sit to stand reps  Goal status: NOT MET   3.  Patient will perform >=8 sit to stand reps for 30 sec chair stands as evidence of improved LE endurance to decrease risk of falling.  Baseline: 2 sit to stand reps  Goal status: NOT MET   4.  Patient will ambulate >=1,000 feet for as evidence of sufficient aerobic endurance to ambulate in the community and continue to maintain his functional independence.  Baseline: NT  Goal status: NOT MET     PLAN:  PT FREQUENCY: 1-2x/week  PT DURATION: 10 weeks  PLANNED INTERVENTIONS: Therapeutic exercises, Therapeutic activity, Neuromuscular re-education, Balance training, Gait training, Patient/Family education, Joint mobilization, Joint manipulation, Aquatic Therapy, Dry Needling, Electrical stimulation, Spinal manipulation, Spinal mobilization, Cryotherapy, Moist heat,  Traction, Manual therapy, and Re-evaluation.  PLAN FOR NEXT SESSION: Discharge from PT   Ellin Goodie PT, DPT  [facility] [address], [place], [address]  Phone: [phone]   Fax:  [phone]

## 2022-12-08 DIAGNOSIS — E119 Type 2 diabetes mellitus without complications: Secondary | ICD-10-CM | POA: Diagnosis not present

## 2022-12-13 ENCOUNTER — Ambulatory Visit: Payer: Medicare Other | Admitting: Physical Therapy

## 2022-12-13 ENCOUNTER — Telehealth: Payer: Self-pay | Admitting: Physical Therapy

## 2022-12-13 NOTE — Telephone Encounter (Signed)
Called pt to inquire about absence from PT. Pt reports that he no longer wants to come to physical therapy and he will continue exercises on his own.

## 2022-12-15 ENCOUNTER — Ambulatory Visit: Payer: Medicare Other | Admitting: Physical Therapy

## 2022-12-20 ENCOUNTER — Encounter: Payer: Medicare Other | Admitting: Physical Therapy

## 2022-12-21 ENCOUNTER — Other Ambulatory Visit: Payer: Self-pay | Admitting: Family Medicine

## 2022-12-22 DIAGNOSIS — N183 Chronic kidney disease, stage 3 unspecified: Secondary | ICD-10-CM | POA: Diagnosis not present

## 2022-12-22 DIAGNOSIS — I130 Hypertensive heart and chronic kidney disease with heart failure and stage 1 through stage 4 chronic kidney disease, or unspecified chronic kidney disease: Secondary | ICD-10-CM | POA: Diagnosis not present

## 2022-12-22 DIAGNOSIS — I5030 Unspecified diastolic (congestive) heart failure: Secondary | ICD-10-CM | POA: Diagnosis not present

## 2022-12-22 DIAGNOSIS — I2510 Atherosclerotic heart disease of native coronary artery without angina pectoris: Secondary | ICD-10-CM | POA: Diagnosis not present

## 2022-12-22 DIAGNOSIS — I480 Paroxysmal atrial fibrillation: Secondary | ICD-10-CM | POA: Diagnosis not present

## 2022-12-22 DIAGNOSIS — Z87891 Personal history of nicotine dependence: Secondary | ICD-10-CM | POA: Diagnosis not present

## 2022-12-27 ENCOUNTER — Encounter: Payer: Medicare Other | Admitting: Physical Therapy

## 2022-12-27 ENCOUNTER — Other Ambulatory Visit: Payer: Self-pay | Admitting: Family Medicine

## 2022-12-27 NOTE — Telephone Encounter (Signed)
Can you please confirm he is taking this as it appears to be a historical medication on our list? Please find out who has been prescribing this. Thanks.

## 2022-12-28 ENCOUNTER — Telehealth: Payer: Self-pay | Admitting: *Deleted

## 2022-12-28 NOTE — Telephone Encounter (Signed)
Patient notified that Patient Assistance Medication are in the office & are ready for pick up.   Medication: Toujeo Solostar  Quantity: 3 boxes  Lot# [id number]  Exp: 12/04/2024

## 2023-01-02 ENCOUNTER — Encounter: Payer: Medicare Other | Admitting: Physical Therapy

## 2023-01-04 ENCOUNTER — Encounter: Payer: Medicare Other | Admitting: Physical Therapy

## 2023-01-10 ENCOUNTER — Encounter: Payer: Medicare Other | Admitting: Physical Therapy

## 2023-01-12 ENCOUNTER — Encounter: Payer: Medicare Other | Admitting: Physical Therapy

## 2023-01-13 ENCOUNTER — Ambulatory Visit: Payer: Self-pay

## 2023-01-13 NOTE — Patient Outreach (Signed)
  Care Coordination   01/13/2023 Name: [name] [name] MRN: [id number] DOB: 1933-11-23   Care Coordination Outreach Attempts:  An unsuccessful telephone outreach was attempted for a scheduled appointment today. HIPAA compliant message left with call back phone number.   Follow Up Plan:  Additional outreach attempts will be made to offer the patient care coordination information and services.   Encounter Outcome:  No Answer   Care Coordination Interventions:  No, not indicated    George Ina RN,BSN,CCM [facility]  [facility], [facility] coordinator / Case Manager Phone: [phone]

## 2023-01-16 ENCOUNTER — Encounter: Payer: Medicare Other | Admitting: Physical Therapy

## 2023-01-17 ENCOUNTER — Other Ambulatory Visit: Payer: Self-pay | Admitting: Family Medicine

## 2023-01-18 ENCOUNTER — Encounter: Payer: Medicare Other | Admitting: Physical Therapy

## 2023-01-19 ENCOUNTER — Other Ambulatory Visit: Payer: Self-pay | Admitting: Family Medicine

## 2023-01-24 ENCOUNTER — Encounter: Payer: Medicare Other | Admitting: Physical Therapy

## 2023-01-25 ENCOUNTER — Telehealth: Payer: Self-pay

## 2023-01-25 NOTE — Telephone Encounter (Signed)
Patient states he is a patient of Dr. Marikay Alar and since he is leaving, he would like to know if Dr. Duncan Dull would be willing to accept him as her patient.  Patient states his wife, Dara Lords, is currently a patient of Dr. Darrick Huntsman.

## 2023-01-26 ENCOUNTER — Encounter: Payer: Medicare Other | Admitting: Physical Therapy

## 2023-02-07 DIAGNOSIS — I4819 Other persistent atrial fibrillation: Secondary | ICD-10-CM | POA: Diagnosis not present

## 2023-02-07 DIAGNOSIS — Z7901 Long term (current) use of anticoagulants: Secondary | ICD-10-CM | POA: Diagnosis not present

## 2023-02-07 DIAGNOSIS — Z5181 Encounter for therapeutic drug level monitoring: Secondary | ICD-10-CM | POA: Diagnosis not present

## 2023-02-07 DIAGNOSIS — I480 Paroxysmal atrial fibrillation: Secondary | ICD-10-CM | POA: Diagnosis not present

## 2023-02-07 DIAGNOSIS — Z951 Presence of aortocoronary bypass graft: Secondary | ICD-10-CM | POA: Diagnosis not present

## 2023-02-07 DIAGNOSIS — I5030 Unspecified diastolic (congestive) heart failure: Secondary | ICD-10-CM | POA: Diagnosis not present

## 2023-02-07 DIAGNOSIS — H353213 Exudative age-related macular degeneration, right eye, with inactive scar: Secondary | ICD-10-CM | POA: Diagnosis not present

## 2023-02-07 DIAGNOSIS — Z79899 Other long term (current) drug therapy: Secondary | ICD-10-CM | POA: Diagnosis not present

## 2023-02-07 DIAGNOSIS — I69354 Hemiplegia and hemiparesis following cerebral infarction affecting left non-dominant side: Secondary | ICD-10-CM | POA: Diagnosis not present

## 2023-02-07 DIAGNOSIS — I4892 Unspecified atrial flutter: Secondary | ICD-10-CM | POA: Diagnosis not present

## 2023-02-07 DIAGNOSIS — H35341 Macular cyst, hole, or pseudohole, right eye: Secondary | ICD-10-CM | POA: Diagnosis not present

## 2023-02-07 DIAGNOSIS — H43813 Vitreous degeneration, bilateral: Secondary | ICD-10-CM | POA: Diagnosis not present

## 2023-02-07 DIAGNOSIS — H35372 Puckering of macula, left eye: Secondary | ICD-10-CM | POA: Diagnosis not present

## 2023-02-07 DIAGNOSIS — M1990 Unspecified osteoarthritis, unspecified site: Secondary | ICD-10-CM | POA: Diagnosis not present

## 2023-02-07 DIAGNOSIS — H43821 Vitreomacular adhesion, right eye: Secondary | ICD-10-CM | POA: Diagnosis not present

## 2023-02-14 ENCOUNTER — Other Ambulatory Visit: Payer: Self-pay | Admitting: Family Medicine

## 2023-02-15 ENCOUNTER — Ambulatory Visit (INDEPENDENT_AMBULATORY_CARE_PROVIDER_SITE_OTHER): Payer: Medicare Other | Admitting: Family Medicine

## 2023-02-15 ENCOUNTER — Encounter: Payer: Self-pay | Admitting: Family Medicine

## 2023-02-15 VITALS — BP 122/72 | HR 65 | Temp 97.8°F | Ht 73.0 in | Wt 211.8 lb

## 2023-02-15 DIAGNOSIS — M79646 Pain in unspecified finger(s): Secondary | ICD-10-CM | POA: Insufficient documentation

## 2023-02-15 DIAGNOSIS — I482 Chronic atrial fibrillation, unspecified: Secondary | ICD-10-CM

## 2023-02-15 DIAGNOSIS — M79645 Pain in left finger(s): Secondary | ICD-10-CM

## 2023-02-15 DIAGNOSIS — E782 Mixed hyperlipidemia: Secondary | ICD-10-CM | POA: Diagnosis not present

## 2023-02-15 NOTE — Progress Notes (Signed)
Marikay Alar, MD Phone: [phone]  [name] [name] is a 89 y.o. male who presents today for follow-up.  A-fib: Patient is on Eliquis.  He reports cardiology recently wanted to start him on amiodarone.  He has not started this yet given that he has grapefruit every day and does not want a give that up.  Notes no palpitations or bleeding issues.  Hyperlipidemia: Patient is on Zetia.  No chest pain.  Finger issue: Patient notes left ring finger felt as though it was burning.  This started 2 weeks ago.  Then the top layer of skin peeled off.  He noted no swelling.  He noted no injury.  No drainage.  No fevers.  Notes it has gotten somewhat better.  There is still little tenderness.  Social History   Tobacco Use  Smoking Status Never  Smokeless Tobacco Never    Current Outpatient Medications on File Prior to Visit  Medication Sig Dispense Refill   acetaminophen (TYLENOL) 325 MG tablet Take by mouth.     amiodarone (PACERONE) 200 MG tablet Take 200 mg by mouth daily.     azelastine (ASTELIN) 0.1 % nasal spray Place 2 sprays into both nostrils 2 (two) times daily. Use in each nostril as directed 30 mL 2   BD PEN NEEDLE NANO U/F 32G X 4 MM MISC USE AS DIRECTED 100 each 0   carvedilol (COREG) 12.5 MG tablet TAKE ONE (1) TABLET BY MOUTH TWO TIMES PER DAY (Patient taking differently: Take 12.5 mg by mouth 2 (two) times daily with a meal.) 60 tablet 0   Cholecalciferol (VITAMIN D3) 50 MCG (2000 UT) TABS Take 2,000 Units by mouth in the morning.     clotrimazole (LOTRIMIN) 1 % cream Apply 1 Application topically 2 (two) times daily.     dapagliflozin propanediol (FARXIGA) 10 MG TABS tablet Take by mouth.     doxazosin (CARDURA) 2 MG tablet Take 1 tablet (2 mg total) by mouth 2 (two) times daily. Please keep upcoming appt for future refills 180 tablet 0   ezetimibe (ZETIA) 10 MG tablet TAKE 1 TABLET BY MOUTH DAILY 90 tablet 0   finasteride (PROSCAR) 5 MG tablet Take 1 tablet (5 mg total) by  mouth daily. (Patient taking differently: Take 5 mg by mouth every evening.) 90 tablet 1   furosemide (LASIX) 20 MG tablet Take 20 mg by mouth. Patient states he takes as needed depending on weight gain as recommended by provider     gabapentin (NEURONTIN) 100 MG capsule Take 1 capsule (100 mg total) by mouth 2 (two) times daily. 60 capsule 2   HUMALOG KWIKPEN 100 UNIT/ML KwikPen Inject into the skin.     hydrALAZINE (APRESOLINE) 25 MG tablet Take 1 tablet (25 mg total) by mouth 3 (three) times daily. 270 tablet 3   insulin degludec (TRESIBA FLEXTOUCH) 100 UNIT/ML FlexTouch Pen Inject into the skin daily. Patient takes 28-30 units 1 x per day. 115-125 takes 28 units, > 125 can take 29-30 units     insulin glargine, 1 Unit Dial, (TOUJEO) 300 UNIT/ML Solostar Pen Inject 40 Units into the skin daily. 4.5 mL 3   isosorbide mononitrate (IMDUR) 30 MG 24 hr tablet Take 1 tablet (30 mg total) by mouth daily. Please call [phone] for a sooner appointment and for further refills. 30 tablet 0   loperamide (IMODIUM A-D) 2 MG tablet Take 1 tablet (2 mg total) by mouth 3 (three) times daily as needed for diarrhea or loose stools. 30  tablet 0   Multiple Vitamins-Minerals (PRESERVISION AREDS 2 PO) Take 1 tablet by mouth in the morning and at bedtime.     omeprazole (PRILOSEC) 20 MG capsule TAKE 1 CAPSULE BY MOUTH TWO TIMES DAILY 60 capsule 1   ondansetron (ZOFRAN-ODT) 4 MG disintegrating tablet Take 1 tablet (4 mg total) by mouth every 8 (eight) hours as needed for nausea or vomiting. 20 tablet 0   potassium chloride (KLOR-CON) 10 MEQ tablet Take 1 tablet (10 mEq total) by mouth daily as needed. Take with torsemide 90 tablet 3   torsemide (DEMADEX) 20 MG tablet FOR WEIGHT 210-215 TAKE 1 TABLET WITH POTASSIUM  FOR WEIGHT GREATER THAN 215 TAKE 1 TABLET TWICE A DAY (8AM & 2PM) WITH 2 POTASSIUM 200 tablet 1   TOUJEO SOLOSTAR 300 UNIT/ML Solostar Pen Inject 10 Units into the skin daily. 22.5 mL 6   No current  facility-administered medications on file prior to visit.     ROS see history of present illness  Objective  Physical Exam Vitals:   02/15/23 1525  BP: 122/72  Pulse: 65  Temp: 97.8 F (36.6 C)  SpO2: 97%    BP Readings from Last 3 Encounters:  02/15/23 122/72  11/16/22 136/84  11/08/22 (!) 150/57   Wt Readings from Last 3 Encounters:  02/15/23 211 lb 12.8 oz (96.1 kg)  11/16/22 210 lb (95.3 kg)  11/08/22 207 lb 0.2 oz (93.9 kg)    Physical Exam Constitutional:      General: He is not in acute distress.    Appearance: He is not diaphoretic.  Cardiovascular:     Rate and Rhythm: Normal rate and regular rhythm.     Heart sounds: Normal heart sounds.  Pulmonary:     Effort: Pulmonary effort is normal.     Breath sounds: Normal breath sounds.  Skin:    General: Skin is warm and dry.  Neurological:     Mental Status: He is alert.       There is tenderness in the distal tip of his left index finger, no fluctuance, no induration, no excessive warmth  Assessment/Plan: Please see individual problem list.  Atrial fibrillation, chronic (HCC) Assessment & Plan: Chronic issue.  Patient is in A-fib.  He will continue Eliquis 2.5 mg twice daily.  Encouraged patient to discuss the amiodarone with his cardiologist.  Advised that eating grapefruit and taking amiodarone can increase the the amount of amiodarone absorbed and thus increase the risk of side effects from amiodarone.  Advised against eating grapefruit if he starts taking the amiodarone.   Mixed hyperlipidemia Assessment & Plan: Chronic issue.  Continue Zetia 10 mg daily.  Check lipid panel.  Orders: -     Lipid panel  Pain of finger of left hand Assessment & Plan: Undetermined cause.  It does not appear to be infected.  He possibly could have injured it without realizing it.  Advised monitoring and if it does not continue to improve he will let us know.     Return in about 3 months (around 05/16/2023)  for Transfer of care.   Marikay Alar, MD [facility]

## 2023-02-15 NOTE — Assessment & Plan Note (Addendum)
Chronic issue.  Patient is in A-fib.  He will continue Eliquis 2.5 mg twice daily.  Encouraged patient to discuss the amiodarone with his cardiologist.  Advised that eating grapefruit and taking amiodarone can increase the the amount of amiodarone absorbed and thus increase the risk of side effects from amiodarone.  Advised against eating grapefruit if he starts taking the amiodarone.

## 2023-02-15 NOTE — Assessment & Plan Note (Signed)
Chronic issue.  Continue Zetia 10 mg daily.  Check lipid panel.

## 2023-02-15 NOTE — Assessment & Plan Note (Signed)
Undetermined cause.  It does not appear to be infected.  He possibly could have injured it without realizing it.  Advised monitoring and if it does not continue to improve he will let us know.

## 2023-02-16 LAB — LIPID PANEL
Cholesterol: 73 mg/dL (ref 0–200)
HDL: 33.8 mg/dL — ABNORMAL LOW (ref >39.00)
LDL Cholesterol: 12 mg/dL (ref 0–99)
NonHDL: 39.69
Total CHOL/HDL Ratio: 2
Triglycerides: 138 mg/dL (ref 0.0–149.0)
VLDL: 27.6 mg/dL (ref 0.0–40.0)

## 2023-02-21 DIAGNOSIS — Z794 Long term (current) use of insulin: Secondary | ICD-10-CM | POA: Diagnosis not present

## 2023-02-21 DIAGNOSIS — E1159 Type 2 diabetes mellitus with other circulatory complications: Secondary | ICD-10-CM | POA: Diagnosis not present

## 2023-02-21 DIAGNOSIS — E1169 Type 2 diabetes mellitus with other specified complication: Secondary | ICD-10-CM | POA: Diagnosis not present

## 2023-02-21 DIAGNOSIS — E785 Hyperlipidemia, unspecified: Secondary | ICD-10-CM | POA: Diagnosis not present

## 2023-02-21 DIAGNOSIS — E1122 Type 2 diabetes mellitus with diabetic chronic kidney disease: Secondary | ICD-10-CM | POA: Diagnosis not present

## 2023-02-21 DIAGNOSIS — N1831 Chronic kidney disease, stage 3a: Secondary | ICD-10-CM | POA: Diagnosis not present

## 2023-02-21 LAB — HEMOGLOBIN A1C: Hemoglobin A1C: 6.9

## 2023-03-20 DIAGNOSIS — J00 Acute nasopharyngitis [common cold]: Secondary | ICD-10-CM | POA: Diagnosis not present

## 2023-03-20 DIAGNOSIS — H66002 Acute suppurative otitis media without spontaneous rupture of ear drum, left ear: Secondary | ICD-10-CM | POA: Diagnosis not present

## 2023-03-20 DIAGNOSIS — J209 Acute bronchitis, unspecified: Secondary | ICD-10-CM | POA: Diagnosis not present

## 2023-03-22 ENCOUNTER — Ambulatory Visit: Payer: Self-pay

## 2023-03-22 NOTE — Patient Outreach (Signed)
  Care Coordination   Follow Up Visit Note   03/22/2023 Name: MONSERRATE FERRELLI MRN: [id number] DOB: 10-Sep-1933  NATALE BALCOM is a 89 y.o. year old male who sees Lovetta Rucks, Robbi Childs, MD for primary care. I spoke with  Geryl Kudo by phone today.  What matters to the patients health and wellness today?  Patient reports he is not feeling the best today due to recent diagnosis of bronchitis and is coughing a lot.  Request to rescheduled today's telephone visit.     Goals Addressed             This Visit's Progress    Development of plan of care for management of diabetes and new onset shortness of breath/ health conditions       Interventions Today    Flowsheet Row Most Recent Value  Chronic Disease   Chronic disease during today's visit Other  [bronchitis]  General Interventions   General Interventions Discussed/Reviewed General Interventions Reviewed  [telephone visit rescheduled with with RNCM due to patient not feeling well.  Advised to follow up with primary care provider if not feeling any better.]              SDOH assessments and interventions completed:  No     Care Coordination Interventions:  Yes, provided   Follow up plan: Follow up call scheduled for 03/28/23 at 11 am.     Encounter Outcome:  Patient Visit Completed   Karisha Marlin RN,BSN,CCM [facility]  [facility] coordinator / Case Manager Phone: [phone]

## 2023-03-23 DIAGNOSIS — J209 Acute bronchitis, unspecified: Secondary | ICD-10-CM | POA: Diagnosis not present

## 2023-03-28 ENCOUNTER — Ambulatory Visit: Payer: Self-pay

## 2023-03-28 NOTE — Patient Outreach (Signed)
  Care Coordination   Follow Up Visit Note   03/28/2023 Name: [name] [name] MRN: [id number] DOB: 19-May-1933  [name] [name] is a 89 y.o. year old male who sees [name], [name], MD for primary care. I spoke with  [name] [name] by phone today.  What matters to the patients health and wellness today?  Patient states he is currently vacationing in [place].  He reports ongoing bronchitis symptoms. Patient states he was seen again at the urgent care last week and received another antibiotic prescription, prednisone and inhaler. Patient reports completing the antibiotic and has 1 more dose of prednisone.  He states he is using the inhalers frequently.  Patient states he still has not been able to recover. He reports ongoing fatigue, cough, wheezing and decrease in appetite.  Patient states he has not had a chest xray. He states he has to sleep with head elevated.   Patient reports blood sugars are elevated ranging 200-300 due to being on the prednisone.  He states the urgent care provider increase his Tresiba from 24 units  to 40 units per day.  Patient reports left leg blister has healed.     Goals Addressed             This Visit's Progress    Development of plan of care for management of diabetes and new onset shortness of breath/ health conditions       Interventions Today    Flowsheet Row Most Recent Value  Chronic Disease   Chronic disease during today's visit Diabetes, Congestive Heart Failure (CHF), Atrial Fibrillation (AFib), Other  [bronchitis, left leg blister.]  General Interventions   General Interventions Discussed/Reviewed General Interventions Reviewed, Doctor Visits, General Interventions Discussed  [evaluation of current treatment plan for listed health conditions and patients adherence to plan as established by provider. Assessed for HF, A-fib and ongoing bronchitis symptoms. Assessed blood sugar readings. Assessed for leg blister.]  Doctor Visits Discussed/Reviewed  Doctor Visits Reviewed  [Discussed recent urgent care visit due to bronchitis diagnosis. Reviewed upcoming provider visit appointments.]  Education Interventions   Education Provided Provided Education  [Discussed concerns regarding unresolved bronchitis symptoms. Advised to seek care at  ED for ongoing bronchits symptoms. Reviewed HF/ atrial fibrillation symptoms. Discussed hyperglycemia in DM. Advised to follow up with provider for ongoing elevated BS.]  Pharmacy Interventions   Pharmacy Dicussed/Reviewed Pharmacy Topics Reviewed  [Inquired if patient completed prescribed antibiotic and prednisone for bronchitis. Reviewed medications and discussed compliance. Discussed recent diabetes medications adjustments.  Advised to notify provider for frequent low blood sugars.]              SDOH assessments and interventions completed:  No     Care Coordination Interventions:  Yes, provided   Follow up plan: Follow up call scheduled for 04/26/23 at 11 am.    Encounter Outcome:  Patient Visit Completed   George Ina RN, BSN, CCM [place]  [facility], Population Health Case Manager Phone: [phone]

## 2023-03-28 NOTE — Patient Instructions (Addendum)
Visit Information  Thank you for taking time to visit with me today. Please don't hesitate to contact me if I can be of assistance to you.   Following are the goals we discussed today:   Goals Addressed             This Visit's Progress    Development of plan of care for management of diabetes and new onset shortness of breath/ health conditions       Interventions Today    Flowsheet Row Most Recent Value  Chronic Disease   Chronic disease during today's visit Diabetes, Congestive Heart Failure (CHF), Atrial Fibrillation (AFib), Other  [bronchitis, left leg blister.]  General Interventions   General Interventions Discussed/Reviewed General Interventions Reviewed, Doctor Visits, General Interventions Discussed  [evaluation of current treatment plan for listed health conditions and patients adherence to plan as established by provider. Assessed for HF, A-fib and ongoing bronchitis symptoms. Assessed blood sugar readings. Assessed for leg blister.]  Doctor Visits Discussed/Reviewed Doctor Visits Reviewed  [Discussed recent urgent care visit due to bronchitis diagnosis. Reviewed upcoming provider visit appointments.]  Education Interventions   Education Provided Provided Education  [Discussed concerns regarding unresolved bronchitis symptoms. Advised to seek care at  ED for ongoing bronchits symptoms. Reviewed HF/ atrial fibrillation symptoms. Discussed hyperglycemia in DM. Advised to follow up with provider for ongoing elevated BS.]  Pharmacy Interventions   Pharmacy Dicussed/Reviewed Pharmacy Topics Reviewed  [Inquired if patient completed prescribed antibiotic and prednisone for bronchitis. Reviewed medications and discussed compliance. Discussed recent diabetes medications adjustments.  Advised to notify provider for frequent low blood sugars.]              Our next appointment is by telephone on 04/26/23 at 11 am  Please call the care guide team at [phone] if you need to  cancel or reschedule your appointment.   If you are experiencing a Mental Health or Behavioral Health Crisis or need someone to talk to, please call the Suicide and Crisis Lifeline: 988 call 1-800-273-TALK (toll free, 24 hour hotline)  Patient verbalizes understanding of instructions and care plan provided today and agrees to view in MyChart. Active MyChart status and patient understanding of how to access instructions and care plan via MyChart confirmed with patient.     George Ina RN, BSN, CCM [employer], Population Health Case Manager Phone: [phone]

## 2023-04-26 ENCOUNTER — Ambulatory Visit: Payer: Self-pay

## 2023-04-26 NOTE — Patient Instructions (Signed)
Visit Information  Thank you for taking time to visit with me today. Please don't hesitate to contact me if I can be of assistance to you.   Following are the goals we discussed today:   Goals Addressed             This Visit's Progress    Development of plan of care for management of diabetes and new onset shortness of breath/ health conditions       Interventions Today    Flowsheet Row Most Recent Value  Chronic Disease   Chronic disease during today's visit Diabetes, Congestive Heart Failure (CHF), Atrial Fibrillation (AFib)  General Interventions   General Interventions Discussed/Reviewed General Interventions Reviewed, Doctor Visits  [evaluation of current treatment plan for listed health conditions and patients adherence to plan as established by provider. Assessed for HF/ Atrial fibrillation symptoms. Assess blood sugar readings.]  Doctor Visits Discussed/Reviewed Doctor Visits Reviewed  Annabell Sabal upcoming provider visits. Advised to keep follow up visits as recommended.]  Education Interventions   Education Provided Provided Education  [reviewed HF/ atrial fibrillation symptoms. Advised to notify provider for increase symptoms or call 911 for severe symptoms. Reviewed Rule of 15 hypoglycemic treatment. Advised ongoing monitoring of weight and pulse daily.]  Provided Verbal Education On Blood Sugar Monitoring  Pharmacy Interventions   Pharmacy Dicussed/Reviewed Pharmacy Topics Reviewed  [medications reviewed and compliance discussed. Discussed recent [place] dosage change due to frequent low blood sugars. Assessed patients blood sugar response to tresiba dose change.]              Our next appointment is by telephone on 06/07/23 at 11 am  Please call the care guide team at [phone] if you need to cancel or reschedule your appointment.   If you are experiencing a Mental Health or Behavioral Health Crisis or need someone to talk to, please call the Suicide and Crisis  Lifeline: 988 call 1-800-273-TALK (toll free, 24 hour hotline)  Patient verbalizes understanding of instructions and care plan provided today and agrees to view in MyChart. Active MyChart status and patient understanding of how to access instructions and care plan via MyChart confirmed with patient.     George Ina RN, BSN, CCM [employer], Population Health Case Manager Phone: [phone]

## 2023-04-26 NOTE — Patient Outreach (Signed)
  Care Coordination   Follow Up Visit Note   04/26/2023 Name: [name] [name] MRN: [id number] DOB: June 10, 1933  [name] [name] is a 89 y.o. year old male who sees [name], [name], MD for primary care. I spoke with  [name] [name] by phone today.  What matters to the patients health and wellness today?  Patient states he is doing well. He reports cough has finally subsided from previous sickness with bronchitis. Patient states he was having frequent low blood sugars <70.  He states he spoke with his endocrinologist and his Evaristo Bury dosage was adjusted to 18 u.  Patient states he has not had any further low blood sugars since medication adjustment.  He reports today's fasting blood sugar was 125.  He states he continues to be on a sliding scale with his meals. Patient denies any heart failure or atrial fibrillation symptoms. He states he is not taking amiodarone due to side effects.  Patient reports today's weight is 203 lbs. He states he occasionally has left leg swelling which is normal for him.     Goals Addressed             This Visit's Progress    Development of plan of care for management of diabetes and new onset shortness of breath/ health conditions       Interventions Today    Flowsheet Row Most Recent Value  Chronic Disease   Chronic disease during today's visit Diabetes, Congestive Heart Failure (CHF), Atrial Fibrillation (AFib)  General Interventions   General Interventions Discussed/Reviewed General Interventions Reviewed, Doctor Visits  [evaluation of current treatment plan for listed health conditions and patients adherence to plan as established by provider. Assessed for HF/ Atrial fibrillation symptoms. Assess blood sugar readings.]  Doctor Visits Discussed/Reviewed Doctor Visits Reviewed  Annabell Sabal upcoming provider visits. Advised to keep follow up visits as recommended.]  Education Interventions   Education Provided Provided Education  [reviewed HF/ atrial  fibrillation symptoms. Advised to notify provider for increase symptoms or call 911 for severe symptoms. Reviewed Rule of 15 hypoglycemic treatment. Advised ongoing monitoring of weight and pulse daily.]  Provided Verbal Education On Blood Sugar Monitoring  Pharmacy Interventions   Pharmacy Dicussed/Reviewed Pharmacy Topics Reviewed  [medications reviewed and compliance discussed. Discussed recent [place] dosage change due to frequent low blood sugars. Assessed patients blood sugar response to tresiba dose change.]              SDOH assessments and interventions completed:  No     Care Coordination Interventions:  Yes, provided   Follow up plan: Follow up call scheduled for 06/07/23 at 11 am    Encounter Outcome:  Patient Visit Completed   George Ina RN, BSN, CCM [place]  [facility], Population Health Case Manager Phone: [phone]

## 2023-04-28 ENCOUNTER — Telehealth: Payer: Self-pay

## 2023-04-28 NOTE — Telephone Encounter (Signed)
Received paperwork from [employer].placed in folder for review and signature

## 2023-05-01 ENCOUNTER — Encounter: Payer: Self-pay | Admitting: Internal Medicine

## 2023-05-01 ENCOUNTER — Ambulatory Visit (INDEPENDENT_AMBULATORY_CARE_PROVIDER_SITE_OTHER): Payer: Medicare Other | Admitting: Internal Medicine

## 2023-05-01 VITALS — BP 110/58 | HR 59 | Ht 73.0 in | Wt 212.4 lb

## 2023-05-01 DIAGNOSIS — Z794 Long term (current) use of insulin: Secondary | ICD-10-CM

## 2023-05-01 DIAGNOSIS — G8929 Other chronic pain: Secondary | ICD-10-CM

## 2023-05-01 DIAGNOSIS — E118 Type 2 diabetes mellitus with unspecified complications: Secondary | ICD-10-CM

## 2023-05-01 DIAGNOSIS — M25512 Pain in left shoulder: Secondary | ICD-10-CM

## 2023-05-01 DIAGNOSIS — E1122 Type 2 diabetes mellitus with diabetic chronic kidney disease: Secondary | ICD-10-CM | POA: Diagnosis not present

## 2023-05-01 DIAGNOSIS — N644 Mastodynia: Secondary | ICD-10-CM | POA: Diagnosis not present

## 2023-05-01 DIAGNOSIS — N183 Chronic kidney disease, stage 3 unspecified: Secondary | ICD-10-CM

## 2023-05-01 DIAGNOSIS — M1990 Unspecified osteoarthritis, unspecified site: Secondary | ICD-10-CM | POA: Diagnosis not present

## 2023-05-01 DIAGNOSIS — I2510 Atherosclerotic heart disease of native coronary artery without angina pectoris: Secondary | ICD-10-CM

## 2023-05-01 DIAGNOSIS — G4733 Obstructive sleep apnea (adult) (pediatric): Secondary | ICD-10-CM | POA: Diagnosis not present

## 2023-05-01 DIAGNOSIS — I482 Chronic atrial fibrillation, unspecified: Secondary | ICD-10-CM

## 2023-05-01 NOTE — Telephone Encounter (Signed)
 Noted. Patient is transferring care to Dr Darrick Huntsman today. This form has Dr Melina Schools name on it so it may be better for her to sign if she is willing.

## 2023-05-01 NOTE — Patient Instructions (Addendum)
 I have ordered a mammogram to rule out a mass in your breast   If your breast tenderness is related to fluid retention, it should improve when you take the furosemide  For your finger pain:   You can take up to  2000 mg of acetominophen (tylenol) every day safely  In divided doses (500 mg every 6 hours  Or 1000 mg every 12 hours.)   You can  add Voltaren gel  for the finger arthritis

## 2023-05-01 NOTE — Assessment & Plan Note (Signed)
 Gynecomastria noted in 2020.  Repeat mammogram orered

## 2023-05-01 NOTE — Telephone Encounter (Signed)
 Will give to Dr. Darrick Huntsman during pt's appt today.

## 2023-05-01 NOTE — Progress Notes (Unsigned)
 Subjective:  Patient ID: [name] [name], male    DOB: Jul 11, 1933  Age: 89 y.o. MRN: [id number]  CC: There were no encounter diagnoses.   HPI [name] [name] presents for  Chief Complaint  Patient presents with   Transfer of Care   Referred by Zena Amos (significant other)   1) CAD with h/o CABG :  [facility].   CABG 18 yrs ago.  The bypasses have closed up, all but one.  .  He has been resuming bicycle riding on the highway for 30 to 35 minutes with wife   2) Atrial fib chronic   3) DM Type 2 managed by Gavin Potters with Evaristo Bury once daily  (recently reduced to 18 units due to symptomatic recurrent lows  while in [place]  )   and farxiga.  Uses a cbg monitor .  Humalog ssi  recently added  a t lunch and dinner.  Sugars have been elevated for the last week since returning home from [facility] vacation .  Has  HISTORY OF IN HOUSE HYPOGLYCEMIA TO  20 DUE TO ADMINISTRATION OF FAST ACTING INSULIN .  HAS A SMALL APPETITE.   PREVIOUS TRIAL OF OZEMPIC LAST YEAR  RESULTED IN A 15 LB WT LOSS BUT WAS CONSTANTLY NAUSEATED  so he stopped the medication .  Weight has stabilized despite small appetite  .  Baseline weight is 200  to 205  4(CHF:  WEIGHS DAILY   AND TAKE FUROSEMIDE FOR 2 LB WEIGHT GAIN  Lifelong non smoker   Cc:  left breast has become tender and sore.  Carries his cell phone in left breast pcket and sleeps with it  2) arthritis  involving  the right index finger  Left arm and shoulder became very painful after sleeping on left side on a couch, firm mattress  while vacationing in [place]     4) h/o CVA embolic      Outpatient Medications Prior to Visit  Medication Sig Dispense Refill   acetaminophen (TYLENOL) 325 MG tablet Take by mouth.     azelastine (ASTELIN) 0.1 % nasal spray Place 2 sprays into both nostrils 2 (two) times daily. Use in each nostril as directed 30 mL 2   BD PEN NEEDLE NANO U/F 32G X 4 MM MISC USE AS DIRECTED 100 each 0   carvedilol (COREG) 12.5 MG tablet TAKE  ONE (1) TABLET BY MOUTH TWO TIMES PER DAY (Patient taking differently: Take 12.5 mg by mouth 2 (two) times daily with a meal.) 60 tablet 0   Cholecalciferol (VITAMIN D3) 50 MCG (2000 UT) TABS Take 2,000 Units by mouth in the morning.     clotrimazole (LOTRIMIN) 1 % cream Apply 1 Application topically 2 (two) times daily.     dapagliflozin propanediol (FARXIGA) 10 MG TABS tablet Take by mouth.     doxazosin (CARDURA) 2 MG tablet Take 1 tablet (2 mg total) by mouth 2 (two) times daily. Please keep upcoming appt for future refills 180 tablet 0   ELIQUIS 2.5 MG TABS tablet Take 2.5 mg by mouth 2 (two) times daily.     ezetimibe (ZETIA) 10 MG tablet TAKE 1 TABLET BY MOUTH DAILY 90 tablet 0   finasteride (PROSCAR) 5 MG tablet Take 1 tablet (5 mg total) by mouth daily. (Patient taking differently: Take 5 mg by mouth every evening.) 90 tablet 1   gabapentin (NEURONTIN) 100 MG capsule Take 1 capsule (100 mg total) by mouth 2 (two) times daily. 60 capsule 2  HUMALOG KWIKPEN 100 UNIT/ML KwikPen Inject into the skin.     hydrALAZINE (APRESOLINE) 25 MG tablet Take 1 tablet (25 mg total) by mouth 3 (three) times daily. 270 tablet 3   insulin degludec (TRESIBA FLEXTOUCH) 100 UNIT/ML FlexTouch Pen Inject into the skin daily. Patient takes 28-30 units 1 x per day. 115-125 takes 28 units, > 125 can take 29-30 units     isosorbide mononitrate (IMDUR) 30 MG 24 hr tablet Take 1 tablet (30 mg total) by mouth daily. Please call [phone] for a sooner appointment and for further refills. 30 tablet 0   loperamide (IMODIUM A-D) 2 MG tablet Take 1 tablet (2 mg total) by mouth 3 (three) times daily as needed for diarrhea or loose stools. 30 tablet 0   Multiple Vitamins-Minerals (PRESERVISION AREDS 2 PO) Take 1 tablet by mouth in the morning and at bedtime.     omeprazole (PRILOSEC) 20 MG capsule TAKE 1 CAPSULE BY MOUTH TWO TIMES DAILY 60 capsule 1   ondansetron (ZOFRAN-ODT) 4 MG disintegrating tablet Take 1 tablet (4 mg  total) by mouth every 8 (eight) hours as needed for nausea or vomiting. 20 tablet 0   potassium chloride (KLOR-CON) 10 MEQ tablet Take 1 tablet (10 mEq total) by mouth daily as needed. Take with torsemide 90 tablet 3   rosuvastatin (CRESTOR) 20 MG tablet Take 20 mg by mouth daily.     torsemide (DEMADEX) 20 MG tablet FOR WEIGHT 210-215 TAKE 1 TABLET WITH POTASSIUM  FOR WEIGHT GREATER THAN 215 TAKE 1 TABLET TWICE A DAY (8AM & 2PM) WITH 2 POTASSIUM 200 tablet 1   furosemide (LASIX) 20 MG tablet Take 20 mg by mouth. Patient states he takes as needed depending on weight gain as recommended by provider (Patient not taking: Reported on 05/01/2023)     amiodarone (PACERONE) 200 MG tablet Take 200 mg by mouth daily. (Patient not taking: Reported on 04/26/2023)     insulin glargine, 1 Unit Dial, (TOUJEO) 300 UNIT/ML Solostar Pen Inject 40 Units into the skin daily. (Patient not taking: Reported on 05/01/2023) 4.5 mL 3   TOUJEO SOLOSTAR 300 UNIT/ML Solostar Pen Inject 10 Units into the skin daily. (Patient not taking: Reported on 05/01/2023) 22.5 mL 6   No facility-administered medications prior to visit.    Review of Systems;  Patient denies headache, fevers, malaise, unintentional weight loss, skin rash, eye pain, sinus congestion and sinus pain, sore throat, dysphagia,  hemoptysis , cough, dyspnea, wheezing, chest pain, palpitations, orthopnea, edema, abdominal pain, nausea, melena, diarrhea, constipation, flank pain, dysuria, hematuria, urinary  Frequency, nocturia, numbness, tingling, seizures,  Focal weakness, Loss of consciousness,  Tremor, insomnia, depression, anxiety, and suicidal ideation.      Objective:  BP (!) 110/58   Pulse (!) 59   Ht 6\' 1"  (1.854 m)   Wt 212 lb 6.4 oz (96.3 kg)   SpO2 98%   BMI 28.02 kg/m   BP Readings from Last 3 Encounters:  05/01/23 (!) 110/58  02/15/23 122/72  11/16/22 136/84    Wt Readings from Last 3 Encounters:  05/01/23 212 lb 6.4 oz (96.3 kg)  02/15/23  211 lb 12.8 oz (96.1 kg)  11/16/22 210 lb (95.3 kg)    Physical Exam  Lab Results  Component Value Date   HGBA1C 6.9 02/21/2023   HGBA1C 6.6 (H) 04/11/2022   HGBA1C 6.7 (H) 03/16/2022    Lab Results  Component Value Date   CREATININE 2.26 (H) 11/16/2022   CREATININE 2.01 (H) 11/08/2022  CREATININE 1.50 04/11/2022    Lab Results  Component Value Date   WBC 4.7 11/08/2022   HGB 12.4 (L) 11/08/2022   HCT 35.7 (L) 11/08/2022   PLT 88 (L) 11/08/2022   GLUCOSE 170 (H) 11/16/2022   CHOL 73 02/15/2023   TRIG 138.0 02/15/2023   HDL 33.80 (L) 02/15/2023   LDLDIRECT 65.0 06/02/2016   LDLCALC 12 02/15/2023   ALT 31 11/08/2022   AST 35 11/08/2022   NA 140 11/16/2022   K 4.5 11/16/2022   CL 108 11/16/2022   CREATININE 2.26 (H) 11/16/2022   BUN 29 (H) 11/16/2022   CO2 23 11/16/2022   TSH 3.245 01/26/2022   PSA 1.60 12/27/2017   INR 1.1 11/08/2022   HGBA1C 6.9 02/21/2023    CT HEAD CODE STROKE WO CONTRAST Result Date: 11/08/2022 CLINICAL DATA:  Code stroke.  Left-sided weakness EXAM: CT HEAD WITHOUT CONTRAST TECHNIQUE: Contiguous axial images were obtained from the base of the skull through the vertex without intravenous contrast. RADIATION DOSE REDUCTION: This exam was performed according to the departmental dose-optimization program which includes automated exposure control, adjustment of the mA and/or kV according to patient size and/or use of iterative reconstruction technique. COMPARISON:  03/16/2022 FINDINGS: Brain: No evidence of acute infarction, hemorrhage, hydrocephalus, extra-axial collection or mass lesion/mass effect. Mild for age generalized cerebral volume loss Vascular: No hyperdense vessel or unexpected calcification. Skull: Normal. Negative for fracture or focal lesion. Sinuses/Orbits: No acute finding. Other: Prelim since 11/08/2022 at 4:25 am to provider [facility] via epic chat ASPECTS [facility] Stroke Program Early CT Score) - Ganglionic level infarction (caudate,  lentiform nuclei, internal capsule, insula, M1-M3 cortex): 7 - Supraganglionic infarction (M4-M6 cortex): 3 Total score (0-10 with 10 being normal): 10 IMPRESSION: 1. No acute finding. 2. ASPECTS is 10. Electronically Signed   By: Tiburcio Pea M.D.   On: 11/08/2022 04:26    Assessment & Plan:  .There are no diagnoses linked to this encounter.   I spent 34 minutes on the day of this face to face encounter reviewing patient's  most recent visit with cardiology,  nephrology,  and neurology,  prior relevant surgical and non surgical procedures, recent  labs and imaging studies, counseling on weight management,  reviewing the assessment and plan with patient, and post visit ordering and reviewing of  diagnostics and therapeutics with patient  .   Follow-up: No follow-ups on file.   Sherlene Shams, MD

## 2023-05-02 DIAGNOSIS — G8929 Other chronic pain: Secondary | ICD-10-CM | POA: Insufficient documentation

## 2023-05-02 NOTE — Assessment & Plan Note (Addendum)
 Patient has been prescribed amiodarone at least twice per review of medical chart and has not been taking it.he is taking eliquis and appears rate controlled currently .  His condition is likely aggravated by untreated OSA

## 2023-05-02 NOTE — Assessment & Plan Note (Addendum)
 Managed by [facility] with Beaulah Corin and fast acting insulin with lunch and dinner.  Patient self adjusts insulin to prevent hypoglycemia.  His nephropathy and CKD is managed by [facility].    Lab Results  Component Value Date   HGBA1C 6.9 02/21/2023

## 2023-05-02 NOTE — Assessment & Plan Note (Signed)
 Untreated due to intolerance of CPAP

## 2023-05-02 NOTE — Assessment & Plan Note (Signed)
 It is unclear why patient is not prescribed an ARB by his endocrinologist, cardiologist or nephrologist despite exhaustive review of medical chart.

## 2023-05-02 NOTE — Assessment & Plan Note (Signed)
 This is a Chronic issue.  Reviewed options which include scheduled use of tylenol,  up to  1000 mg 3 times a day as needed for pain and topical use of voltaren .

## 2023-05-02 NOTE — Assessment & Plan Note (Signed)
 He is under the care of nephrologist Dr Thedore Mins.  There is no documented history of ACE I or ARB contraindication.  Cr has been stable based on review of labs  Lab Results  Component Value Date   CREATININE 2.26 (H) 11/16/2022

## 2023-05-02 NOTE — Assessment & Plan Note (Addendum)
 Likely due to bone spurring , symptomatic with lying on left side during recent vacation . If no improvement with return to regular mattress I have recommended reerral to dr Joice Lofts for evaluation

## 2023-05-03 DIAGNOSIS — L814 Other melanin hyperpigmentation: Secondary | ICD-10-CM | POA: Diagnosis not present

## 2023-05-03 DIAGNOSIS — L570 Actinic keratosis: Secondary | ICD-10-CM | POA: Diagnosis not present

## 2023-05-03 DIAGNOSIS — L821 Other seborrheic keratosis: Secondary | ICD-10-CM | POA: Diagnosis not present

## 2023-05-03 DIAGNOSIS — D692 Other nonthrombocytopenic purpura: Secondary | ICD-10-CM | POA: Diagnosis not present

## 2023-05-03 DIAGNOSIS — L820 Inflamed seborrheic keratosis: Secondary | ICD-10-CM | POA: Diagnosis not present

## 2023-05-03 DIAGNOSIS — D1801 Hemangioma of skin and subcutaneous tissue: Secondary | ICD-10-CM | POA: Diagnosis not present

## 2023-05-03 DIAGNOSIS — D225 Melanocytic nevi of trunk: Secondary | ICD-10-CM | POA: Diagnosis not present

## 2023-05-03 DIAGNOSIS — Z85828 Personal history of other malignant neoplasm of skin: Secondary | ICD-10-CM | POA: Diagnosis not present

## 2023-05-04 DIAGNOSIS — H6123 Impacted cerumen, bilateral: Secondary | ICD-10-CM | POA: Diagnosis not present

## 2023-05-04 DIAGNOSIS — H903 Sensorineural hearing loss, bilateral: Secondary | ICD-10-CM | POA: Diagnosis not present

## 2023-05-08 DIAGNOSIS — H353213 Exudative age-related macular degeneration, right eye, with inactive scar: Secondary | ICD-10-CM | POA: Diagnosis not present

## 2023-05-08 DIAGNOSIS — H35372 Puckering of macula, left eye: Secondary | ICD-10-CM | POA: Diagnosis not present

## 2023-05-08 DIAGNOSIS — H43821 Vitreomacular adhesion, right eye: Secondary | ICD-10-CM | POA: Diagnosis not present

## 2023-05-08 DIAGNOSIS — H43813 Vitreous degeneration, bilateral: Secondary | ICD-10-CM | POA: Diagnosis not present

## 2023-05-08 DIAGNOSIS — H35341 Macular cyst, hole, or pseudohole, right eye: Secondary | ICD-10-CM | POA: Diagnosis not present

## 2023-05-09 ENCOUNTER — Other Ambulatory Visit: Payer: Self-pay | Admitting: Internal Medicine

## 2023-05-09 DIAGNOSIS — R928 Other abnormal and inconclusive findings on diagnostic imaging of breast: Secondary | ICD-10-CM

## 2023-05-09 DIAGNOSIS — N644 Mastodynia: Secondary | ICD-10-CM

## 2023-05-12 ENCOUNTER — Ambulatory Visit
Admission: RE | Admit: 2023-05-12 | Discharge: 2023-05-12 | Disposition: A | Discharge status: Home / Self Care | Source: Ambulatory Visit | Attending: Internal Medicine | Admitting: Internal Medicine

## 2023-05-12 DIAGNOSIS — N644 Mastodynia: Secondary | ICD-10-CM

## 2023-05-12 DIAGNOSIS — N62 Hypertrophy of breast: Secondary | ICD-10-CM | POA: Diagnosis not present

## 2023-05-12 DIAGNOSIS — E119 Type 2 diabetes mellitus without complications: Secondary | ICD-10-CM | POA: Diagnosis not present

## 2023-05-12 DIAGNOSIS — R92323 Mammographic fibroglandular density, bilateral breasts: Secondary | ICD-10-CM | POA: Diagnosis not present

## 2023-05-12 DIAGNOSIS — E1140 Type 2 diabetes mellitus with diabetic neuropathy, unspecified: Secondary | ICD-10-CM | POA: Diagnosis not present

## 2023-05-14 ENCOUNTER — Encounter: Payer: Self-pay | Admitting: Internal Medicine

## 2023-05-16 DIAGNOSIS — H04123 Dry eye syndrome of bilateral lacrimal glands: Secondary | ICD-10-CM | POA: Diagnosis not present

## 2023-05-22 DIAGNOSIS — I10 Essential (primary) hypertension: Secondary | ICD-10-CM | POA: Diagnosis not present

## 2023-05-22 DIAGNOSIS — E1122 Type 2 diabetes mellitus with diabetic chronic kidney disease: Secondary | ICD-10-CM | POA: Diagnosis not present

## 2023-05-22 DIAGNOSIS — N189 Chronic kidney disease, unspecified: Secondary | ICD-10-CM | POA: Diagnosis not present

## 2023-05-22 DIAGNOSIS — N1831 Chronic kidney disease, stage 3a: Secondary | ICD-10-CM | POA: Diagnosis not present

## 2023-05-22 DIAGNOSIS — N179 Acute kidney failure, unspecified: Secondary | ICD-10-CM | POA: Diagnosis not present

## 2023-05-24 DIAGNOSIS — L308 Other specified dermatitis: Secondary | ICD-10-CM | POA: Diagnosis not present

## 2023-06-07 ENCOUNTER — Ambulatory Visit: Payer: Medicare Other

## 2023-06-07 NOTE — Patient Outreach (Signed)
 Care Coordination   Follow Up Visit Note   06/07/2023 Name: [name] [name] MRN: [id number] DOB: [birth year]  [name] [name] is a 89 y.o. year old male who sees Darrick Huntsman, Mar Daring, MD for primary care. I spoke with  [name] by phone today.  What matters to the patients health and wellness today?  Patient reports fasting blood sugar this morning was 121.  He states he just checked his blood sugar and it registered 251.  He states he will use his sliding scale to cover for lunch. Patient denies any recent blood sugars <70.  Patient denies any increase in HF/ atrial fibrillation symptoms. He states he continues to ride his bike 30-45 per day.  Patient reports seeing his primary care provider in 04/2023 due to left breast tenderness. He states he had a mammogram that came back ok.  He states the left breast area is still somewhat sore and a little swollen. He states he will follow up with his primary care provider as needed.  Patient states he sometimes sleeps with his phone under his pillow or laying beside him at night and he wonders if maybe causing his issue. Patient reports having follow up with nephrologist. He states his nephrologist was pleased that his kidney function has gotten a little better.     Goals Addressed             This Visit's Progress    Development of plan of care for management of diabetes and new onset shortness of breath/ health conditions       Interventions Today    Flowsheet Row Most Recent Value  Chronic Disease   Chronic disease during today's visit Congestive Heart Failure (CHF), Atrial Fibrillation (AFib), Other  [left breast tenderness.]  General Interventions   General Interventions Discussed/Reviewed General Interventions Reviewed, Labs, Doctor Visits  [evaluation of treatment plan for listed health conditions and patients adherence to plan as established by provider. Assessed for HF/ atrial fibrillation symptoms and discussed new symptoms of left breast  tenderness.]  Labs Kidney Function  [discussed recent renal panel lab and improving GFR.]  Doctor Visits Discussed/Reviewed Doctor Visits Reviewed  [reviewed upcoming provider visits. Advised to keep follow up visits with providers as recommended.]  Exercise Interventions   Exercise Discussed/Reviewed Exercise Reviewed  [assessed exercise routine.Applauded patient for his consistency with exercising.]  Education Interventions   Education Provided Provided Education  [Advised importance of staying hyrdated and not taking NSAIDS  to support kidney health, Reviewed HF/ atrial fibrillaiton symptoms. Advised to nofity provider for frequent blood sugars <70 or >250.]  Provided Verbal Education On Other  [Advised to not keep cell phone number pillow or under covers when sleeping due to dangers of phone over heating.]  Nutrition Interventions   Nutrition Discussed/Reviewed Nutrition Reviewed, Carbohydrate meal planning  [Advised ongoing adherence to low carbohydrate, heart healthy diet.]  Pharmacy Interventions   Pharmacy Dicussed/Reviewed Pharmacy Topics Reviewed  [medications reviewed. Discussed and advised ongoing compliance with medications. Advised to follow up with endocrinology office to renew medication assistance for Farxiga.]              SDOH assessments and interventions completed:  No     Care Coordination Interventions:  Yes, provided   Follow up plan: Follow up call scheduled for 07/25/23 at 11 am    Encounter Outcome:  Patient Visit Completed   George Ina RN, BSN, CCM [place]  [facility], Population Health Case Manager Phone: [phone]

## 2023-06-07 NOTE — Patient Instructions (Signed)
 Visit Information  Thank you for taking time to visit with me today. Please don't hesitate to contact me if I can be of assistance to you.   Following are the goals we discussed today:   Goals Addressed             This Visit's Progress    Development of plan of care for management of diabetes and new onset shortness of breath/ health conditions       Interventions Today    Flowsheet Row Most Recent Value  Chronic Disease   Chronic disease during today's visit Congestive Heart Failure (CHF), Atrial Fibrillation (AFib), Other  [left breast tenderness.]  General Interventions   General Interventions Discussed/Reviewed General Interventions Reviewed, Labs, Doctor Visits  [evaluation of treatment plan for listed health conditions and patients adherence to plan as established by provider. Assessed for HF/ atrial fibrillation symptoms and discussed new symptoms of left breast tenderness.]  Labs Kidney Function  [discussed recent renal panel lab and improving GFR.]  Doctor Visits Discussed/Reviewed Doctor Visits Reviewed  [reviewed upcoming provider visits. Advised to keep follow up visits with providers as recommended.]  Exercise Interventions   Exercise Discussed/Reviewed Exercise Reviewed  [assessed exercise routine.Applauded patient for his consistency with exercising.]  Education Interventions   Education Provided Provided Education  [Advised importance of staying hyrdated and not taking NSAIDS  to support kidney health, Reviewed HF/ atrial fibrillaiton symptoms. Advised to nofity provider for frequent blood sugars <70 or >250.]  Provided Verbal Education On Other  [Advised to not keep cell phone number pillow or under covers when sleeping due to dangers of phone over heating.]  Nutrition Interventions   Nutrition Discussed/Reviewed Nutrition Reviewed, Carbohydrate meal planning  [Advised ongoing adherence to low carbohydrate, heart healthy diet.]  Pharmacy Interventions   Pharmacy  Dicussed/Reviewed Pharmacy Topics Reviewed  [medications reviewed. Discussed and advised ongoing compliance with medications. Advised to follow up with endocrinology office to renew medication assistance for Farxiga.]              Our next appointment is by telephone on 07/25/23 at 11 am  Please call the care guide team at [phone] if you need to cancel or reschedule your appointment.   If you are experiencing a Mental Health or Behavioral Health Crisis or need someone to talk to, please call the Suicide and Crisis Lifeline: 988 call 1-800-273-TALK (toll free, 24 hour hotline)  Patient verbalizes understanding of instructions and care plan provided today and agrees to view in MyChart. Active MyChart status and patient understanding of how to access instructions and care plan via MyChart confirmed with patient.     George Ina RN, BSN, CCM [employer], Population Health Case Manager Phone: [phone]

## 2023-06-22 ENCOUNTER — Other Ambulatory Visit: Payer: Self-pay

## 2023-06-22 MED ORDER — OMEPRAZOLE 20 MG PO CPDR: 20.0000 mg | DELAYED_RELEASE_CAPSULE | Freq: Two times a day (BID) | ORAL | 1 refills | Status: DC

## 2023-06-27 DIAGNOSIS — H35033 Hypertensive retinopathy, bilateral: Secondary | ICD-10-CM | POA: Diagnosis not present

## 2023-06-27 DIAGNOSIS — H35341 Macular cyst, hole, or pseudohole, right eye: Secondary | ICD-10-CM | POA: Diagnosis not present

## 2023-06-27 DIAGNOSIS — H43813 Vitreous degeneration, bilateral: Secondary | ICD-10-CM | POA: Diagnosis not present

## 2023-06-27 DIAGNOSIS — H353213 Exudative age-related macular degeneration, right eye, with inactive scar: Secondary | ICD-10-CM | POA: Diagnosis not present

## 2023-06-27 DIAGNOSIS — Z961 Presence of intraocular lens: Secondary | ICD-10-CM | POA: Diagnosis not present

## 2023-06-27 DIAGNOSIS — H43821 Vitreomacular adhesion, right eye: Secondary | ICD-10-CM | POA: Diagnosis not present

## 2023-06-27 DIAGNOSIS — H35372 Puckering of macula, left eye: Secondary | ICD-10-CM | POA: Diagnosis not present

## 2023-07-04 DIAGNOSIS — E1122 Type 2 diabetes mellitus with diabetic chronic kidney disease: Secondary | ICD-10-CM | POA: Diagnosis not present

## 2023-07-04 DIAGNOSIS — E1159 Type 2 diabetes mellitus with other circulatory complications: Secondary | ICD-10-CM | POA: Diagnosis not present

## 2023-07-04 DIAGNOSIS — Z794 Long term (current) use of insulin: Secondary | ICD-10-CM | POA: Diagnosis not present

## 2023-07-04 DIAGNOSIS — E1169 Type 2 diabetes mellitus with other specified complication: Secondary | ICD-10-CM | POA: Diagnosis not present

## 2023-07-04 DIAGNOSIS — E785 Hyperlipidemia, unspecified: Secondary | ICD-10-CM | POA: Diagnosis not present

## 2023-07-04 DIAGNOSIS — N1831 Chronic kidney disease, stage 3a: Secondary | ICD-10-CM | POA: Diagnosis not present

## 2023-07-05 DIAGNOSIS — H35341 Macular cyst, hole, or pseudohole, right eye: Secondary | ICD-10-CM | POA: Diagnosis not present

## 2023-07-05 DIAGNOSIS — H43821 Vitreomacular adhesion, right eye: Secondary | ICD-10-CM | POA: Diagnosis not present

## 2023-07-05 DIAGNOSIS — H353213 Exudative age-related macular degeneration, right eye, with inactive scar: Secondary | ICD-10-CM | POA: Diagnosis not present

## 2023-07-05 DIAGNOSIS — H43813 Vitreous degeneration, bilateral: Secondary | ICD-10-CM | POA: Diagnosis not present

## 2023-07-05 DIAGNOSIS — H35372 Puckering of macula, left eye: Secondary | ICD-10-CM | POA: Diagnosis not present

## 2023-07-05 DIAGNOSIS — H35033 Hypertensive retinopathy, bilateral: Secondary | ICD-10-CM | POA: Diagnosis not present

## 2023-07-05 DIAGNOSIS — Z961 Presence of intraocular lens: Secondary | ICD-10-CM | POA: Diagnosis not present

## 2023-07-05 DIAGNOSIS — H4922 Sixth [abducent] nerve palsy, left eye: Secondary | ICD-10-CM | POA: Diagnosis not present

## 2023-07-06 ENCOUNTER — Other Ambulatory Visit: Payer: Self-pay | Admitting: Internal Medicine

## 2023-07-07 DIAGNOSIS — H538 Other visual disturbances: Secondary | ICD-10-CM | POA: Diagnosis not present

## 2023-07-13 DIAGNOSIS — H3530 Unspecified macular degeneration: Secondary | ICD-10-CM | POA: Diagnosis not present

## 2023-07-13 DIAGNOSIS — H532 Diplopia: Secondary | ICD-10-CM | POA: Diagnosis not present

## 2023-07-18 DIAGNOSIS — M7582 Other shoulder lesions, left shoulder: Secondary | ICD-10-CM | POA: Diagnosis not present

## 2023-07-18 DIAGNOSIS — M1711 Unilateral primary osteoarthritis, right knee: Secondary | ICD-10-CM | POA: Diagnosis not present

## 2023-07-20 ENCOUNTER — Other Ambulatory Visit: Payer: Self-pay | Admitting: Internal Medicine

## 2023-07-24 ENCOUNTER — Ambulatory Visit: Payer: Medicare Other | Admitting: *Deleted

## 2023-07-24 VITALS — Ht 73.0 in | Wt 203.0 lb

## 2023-07-24 DIAGNOSIS — Z0000 Encounter for general adult medical examination without abnormal findings: Secondary | ICD-10-CM

## 2023-07-24 NOTE — Patient Instructions (Signed)
 [name] [name] , Thank you for taking time out of your busy schedule to complete your Annual Wellness Visit with me. I enjoyed our conversation and look forward to speaking with you again next year. I, as well as your care team,  appreciate your ongoing commitment to your health goals. Please review the following plan we discussed and let me know if I can assist you in the future. Your Game plan/ To Do List    Referrals: If you haven't heard from the office you've been referred to, please reach out to them at the phone provided.   Remember to get a flu annually.  Follow up Visits: Next Medicare AWV with our clinical staff: 07/24/24 @ 1:40   Have you seen your provider in the last 6 months (3 months if uncontrolled diabetes)? Yes Next Office Visit with your provider: 10/25/23  Clinician Recommendations:  Aim for 30 minutes of exercise or brisk walking, 6-8 glasses of water, and 5 servings of fruits and vegetables each day.       This is a list of the screening recommended for you and due dates:  Health Maintenance  Topic Date Due   Eye exam for diabetics  04/21/2022   COVID-19 Vaccine (4 - 2024-25 season) 11/06/2022   Hemoglobin A1C  08/22/2023   Flu Shot  10/06/2023   Complete foot exam   02/15/2024   Medicare Annual Wellness Visit  07/23/2024   DTaP/Tdap/Td vaccine (3 - Tdap) 09/03/2031   Pneumonia Vaccine  Completed   Zoster (Shingles) Vaccine  Completed   HPV Vaccine  Aged Out   Meningitis B Vaccine  Aged Out    Advanced directives: (Copy Requested) Please bring a copy of your health care power of attorney and living will to the office to be added to your chart at your convenience. You can mail to [facility] [address] [place], [place] [address] or email to [email or web address] Advance Care Planning is important because it:  [x]  Makes sure you receive the medical care that is consistent with your values, goals, and preferences  [x]  It provides guidance to  your family and loved ones and reduces their decisional burden about whether or not they are making the right decisions based on your wishes.

## 2023-07-24 NOTE — Progress Notes (Signed)
 Subjective:   [name] [name] is a 89 y.o. who presents for a Medicare Wellness preventive visit.  As a reminder, Annual Wellness Visits don't include a physical exam, and some assessments may be limited, especially if this visit is performed virtually. We may recommend an in-person follow-up visit with your provider if needed.  Visit Complete: Virtual I connected with  [name] [name] on 07/24/23 by a audio enabled telemedicine application and verified that I am speaking with the correct person using two identifiers.  Patient Location: Home  Provider Location: Home Office  I discussed the limitations of evaluation and management by telemedicine. The patient expressed understanding and agreed to proceed.  Vital Signs: Because this visit was a virtual/telehealth visit, some criteria may be missing or patient reported. Any vitals not documented were not able to be obtained and vitals that have been documented are patient reported.  VideoDeclined- This patient declined Librarian, academic. Therefore the visit was completed with audio only.  Persons Participating in Visit: Patient.  AWV Questionnaire: No: Patient Medicare AWV questionnaire was not completed prior to this visit.  Cardiac Risk Factors include: advanced age (>50men, >9 women);diabetes mellitus;dyslipidemia;hypertension;male gender     Objective:     Today's Vitals   07/24/23 1303 07/24/23 1304  Weight: 203 lb (92.1 kg)   Height: 6\' 1"  (1.854 m)   PainSc:  9    Body mass index is 26.78 kg/m.     07/24/2023    1:26 PM 12/06/2022    2:26 PM 11/08/2022    5:43 AM 07/19/2022    2:03 PM 03/22/2022   10:24 AM 03/14/2022   10:00 AM 01/26/2022    1:00 PM  Advanced Directives  Does Patient Have a Medical Advance Directive? Yes Yes No Yes No No Yes  Type of Estate agent of [place];Living will Living will  Healthcare Power of [place];Living will   Living will  Does  patient want to make changes to medical advance directive?  No - Patient declined       Copy of Healthcare Power of Attorney in Chart? No - copy requested   No - copy requested     Would patient like information on creating a medical advance directive?  No - Patient declined   No - Patient declined No - Patient declined     Current Medications (verified) Outpatient Encounter Medications as of 07/24/2023  Medication Sig   acetaminophen  (TYLENOL ) 325 MG tablet Take by mouth.   azelastine  (ASTELIN ) 0.1 % nasal spray Place 2 sprays into both nostrils 2 (two) times daily. Use in each nostril as directed   BD PEN NEEDLE NANO U/F 32G X 4 MM MISC USE AS DIRECTED   carvedilol  (COREG ) 12.5 MG tablet TAKE ONE (1) TABLET BY MOUTH TWO TIMES PER DAY (Patient taking differently: Take 12.5 mg by mouth 2 (two) times daily with a meal.)   Cholecalciferol  (VITAMIN D3) 50 MCG (2000 UT) TABS Take 2,000 Units by mouth in the morning.   clotrimazole  (LOTRIMIN ) 1 % cream Apply 1 Application topically 2 (two) times daily.   dapagliflozin  propanediol (FARXIGA ) 10 MG TABS tablet Take by mouth.   doxazosin  (CARDURA ) 2 MG tablet Take 1 tablet (2 mg total) by mouth 2 (two) times daily. Please keep upcoming appt for future refills   ELIQUIS  2.5 MG TABS tablet Take 2.5 mg by mouth 2 (two) times daily.   ezetimibe  (ZETIA ) 10 MG tablet TAKE 1 TABLET BY MOUTH DAILY  finasteride  (PROSCAR ) 5 MG tablet Take 1 tablet (5 mg total) by mouth daily. (Patient taking differently: Take 5 mg by mouth every evening.)   furosemide  (LASIX ) 20 MG tablet Take 20 mg by mouth. Patient states he takes as needed depending on weight gain as recommended by provider   gabapentin  (NEURONTIN ) 100 MG capsule TAKE 1 CAPSULE BY MOUTH TWO TIMES DAILY   HUMALOG KWIKPEN 100 UNIT/ML KwikPen Inject into the skin. 4 units am and then sliding scale   hydrALAZINE  (APRESOLINE ) 25 MG tablet Take 1 tablet (25 mg total) by mouth 3 (three) times daily.   insulin   degludec (TRESIBA FLEXTOUCH) 100 UNIT/ML FlexTouch Pen Inject into the skin daily. Patient takes 28-30 units 1 x per day. 115-125 takes 28 units, > 125 can take 29-30 units   isosorbide  mononitrate (IMDUR ) 30 MG 24 hr tablet Take 1 tablet (30 mg total) by mouth daily. Please call [phone] for a sooner appointment and for further refills.   loperamide  (IMODIUM  A-D) 2 MG tablet Take 1 tablet (2 mg total) by mouth 3 (three) times daily as needed for diarrhea or loose stools.   Multiple Vitamins-Minerals (PRESERVISION AREDS 2 PO) Take 1 tablet by mouth in the morning and at bedtime.   omeprazole  (PRILOSEC) 20 MG capsule TAKE 1 CAPSULE (20 MG) BY MOUTH TWICE DAILY   ondansetron  (ZOFRAN -ODT) 4 MG disintegrating tablet Take 1 tablet (4 mg total) by mouth every 8 (eight) hours as needed for nausea or vomiting.   potassium chloride  (KLOR-CON ) 10 MEQ tablet Take 1 tablet (10 mEq total) by mouth daily as needed. Take with torsemide    rosuvastatin  (CRESTOR ) 20 MG tablet Take 20 mg by mouth daily.   torsemide  (DEMADEX ) 20 MG tablet FOR WEIGHT 210-215 TAKE 1 TABLET WITH POTASSIUM  FOR WEIGHT GREATER THAN 215 TAKE 1 TABLET TWICE A DAY (8AM & 2PM) WITH 2 POTASSIUM   No facility-administered encounter medications on file as of 07/24/2023.    Allergies (verified) Cortisone, Metformin , and Semaglutide   History: Past Medical History:  Diagnosis Date   (HFpEF) heart failure with preserved ejection fraction (HCC)    a. 06/2007 Echo: nl systolic fxn, mild LVH, diastolic relaxation abnormality, mildly enlarged LA, mild Ao insufficiency; 01/2018 Echo: EF 55-60%, no rwma, GR2 DD, triv AI, mildly dil LA, mild inc PASP.   AMS (altered mental status) 02/03/2021   BPH (benign prostatic hyperplasia)    Cancer (HCC)    skin   Cataract    Choledocholithiasis    a. 05/2020 ERCP w/ CBD stone removal and biliary sphincterotomy.   Coronary artery disease    a. 01/2005 s/p CABG x 4: LIMA-LAD, VG-Diag, VG-OM3, VG-dRCA; b.  06/2013 Cath: patent grafts; c. 09/2015 Cath: VG->Diag 100, other grafts patent. LAD 100, RCA sev dzs->Med rx; d. 01/2018 Cath: LAD 100ost/p, LCX 50, RCA 100p/d, VG->OM3 100, VG->Diag 100, LIMA->LAD ok, VG->RPAV 75m-->Med rx.   COVID-19    07/2020 had paxlovid    Dyspnea    with exertion   Dysrhythmia    ED (erectile dysfunction)    Elevated LFTs    a. 05/2020 following CBD stone. 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   GERD (gastroesophageal reflux disease)    Hyperlipidemia    Hypertension    IDDM (insulin  dependent diabetes mellitus) 2000   Osteoarthritis    PAH (pulmonary artery hypertension) (HCC)    a. 01/2018 RHC: PA 51/22 (32).   Perirectal fistula    Pneumonia 12/2016  Shingles 09/04/2018   Tubular adenoma of colon 07/2012   Vertigo    Past Surgical History:  Procedure Laterality Date   BACK SURGERY     CARDIAC CATHETERIZATION  06/05/2013   [facility].    CARDIAC CATHETERIZATION N/A 09/24/2015   Procedure: Right Heart Cath and Coronary/Graft Angiography;  Surgeon: Devorah Fonder, MD;  Location: [facility] INVASIVE CV LAB;  Service: Cardiovascular;  Laterality: N/A;   CATARACT EXTRACTION  sept 2013   right   CATARACT EXTRACTION W/PHACO Left 03/28/2017   Procedure: CATARACT EXTRACTION PHACO AND INTRAOCULAR LENS PLACEMENT (IOC);  Surgeon: Clair Crews, MD;  Location: [facility] ORS;  Service: Ophthalmology;  Laterality: Left;  US  00:42.0 AP% 15.0 CDE 6.31 Fluid Pack lot # [id number]   CHOLECYSTECTOMY  05/15/2011   Procedure: LAPAROSCOPIC CHOLECYSTECTOMY;  Surgeon: Enid Harry, MD;  Location: [facility] OR;  Service: General;  Laterality: N/A;   COLONOSCOPY W/ POLYPECTOMY     CORONARY ARTERY BYPASS GRAFT  2007   x 4   CORONARY STENT INTERVENTION N/A 12/08/2020   Procedure: CORONARY STENT INTERVENTION;  Surgeon: Sammy Crisp, MD;  Location: [facility] INVASIVE CV LAB;  Service: Cardiovascular;  Laterality: N/A;   ERCP N/A 05/05/2020   Procedure:  ENDOSCOPIC RETROGRADE CHOLANGIOPANCREATOGRAPHY (ERCP);  Surgeon: Marnee Sink, MD;  Location: [facility] ENDOSCOPY;  Service: Endoscopy;  Laterality: N/A;   ERCP N/A 05/12/2020   Procedure: ENDOSCOPIC RETROGRADE CHOLANGIOPANCREATOGRAPHY (ERCP);  Surgeon: Marnee Sink, MD;  Location: [facility] ENDOSCOPY;  Service: Endoscopy;  Laterality: N/A;   ESOPHAGOGASTRODUODENOSCOPY N/A 05/12/2020   Procedure: ESOPHAGOGASTRODUODENOSCOPY (EGD);  Surgeon: Marnee Sink, MD;  Location: [facility] ENDOSCOPY;  Service: Endoscopy;  Laterality: N/A;   EYE SURGERY     FOOT SURGERY  2012   right foot   JOINT REPLACEMENT  8/10   Right THR--[name]   LEFT AND RIGHT HEART CATHETERIZATION WITH CORONARY/GRAFT ANGIOGRAM N/A 06/05/2013   Procedure: LEFT AND RIGHT HEART CATHETERIZATION WITH Estella Helling;  Surgeon: Peter M Swaziland, MD;  Location: [facility] CATH LAB;  Service: Cardiovascular;  Laterality: N/A;   LUMBAR LAMINECTOMY  1989   Prostate photovaporization  5/16   Dr Jeremiah Monica   RIGHT/LEFT HEART CATH AND CORONARY/GRAFT ANGIOGRAPHY N/A 01/05/2018   Procedure: RIGHT/LEFT HEART CATH AND CORONARY/GRAFT ANGIOGRAPHY;  Surgeon: Devorah Fonder, MD;  Location: [facility] INVASIVE CV LAB;  Service: Cardiovascular;  Laterality: N/A;   RIGHT/LEFT HEART CATH AND CORONARY/GRAFT ANGIOGRAPHY Bilateral 12/08/2020   Procedure: RIGHT/LEFT HEART CATH AND CORONARY/GRAFT ANGIOGRAPHY;  Surgeon: Sammy Crisp, MD;  Location: [facility] INVASIVE CV LAB;  Service: Cardiovascular;  Laterality: Bilateral;   Family History  Problem Relation Age of Onset   Lung cancer Mother    Heart disease Father    Colon cancer Neg Hx    Breast cancer Neg Hx    Social History   Socioeconomic History   Marital status: Married    Spouse name: Jan   Number of children: 4   Years of education: Not on file   Highest education level: Not on file  Occupational History   Occupation: Warden/ranger    Comment: Animator  Tobacco Use   Smoking  status: Never   Smokeless tobacco: Never  Vaping Use   Vaping status: Never Used  Substance and Sexual Activity   Alcohol use: Yes    Comment: occ cocktail   Drug use: No   Sexual activity: Not Currently  Other Topics Concern   Not on file  Social History Narrative   Goes by [place]   Has living will  Has designated [employer] as health care POA   Would accept resuscitation attempts   Would not want tube feeds if cognitively unaware   Social Drivers of Health   Financial Resource Strain: Low Risk  (07/24/2023)   Overall Financial Resource Strain (CARDIA)    Difficulty of Paying Living Expenses: Not hard at all  Food Insecurity: No Food Insecurity (07/24/2023)   Hunger Vital Sign    Worried About Running Out of Food in the Last Year: Never true    Ran Out of Food in the Last Year: Never true  Transportation Needs: No Transportation Needs (07/24/2023)   PRAPARE - Administrator, Civil Service (Medical): No    Lack of Transportation (Non-Medical): No  Physical Activity: Inactive (07/24/2023)   Exercise Vital Sign    Days of Exercise per Week: 0 days    Minutes of Exercise per Session: 0 min  Stress: Stress Concern Present (07/24/2023)   [employer] of Occupational Health - Occupational Stress Questionnaire    Feeling of Stress : Very much  Social Connections: Moderately Integrated (07/24/2023)   Social Connection and Isolation Panel [NHANES]    Frequency of Communication with Friends and Family: More than three times a week    Frequency of Social Gatherings with Friends and Family: Once a week    Attends Religious Services: More than 4 times per year    Active Member of [employer] or Organizations: No    Attends Engineer, structural: Never    Marital Status: Living with partner    Tobacco Counseling Counseling given: Not Answered    Clinical Intake:  Pre-visit preparation completed: Yes  Pain : 0-10 Pain Score: 9  Pain Type: Acute pain (has  seen doctor) Pain Location: Knee Pain Orientation: Right Pain Onset: 1 to 4 weeks ago Pain Frequency: Constant     BMI - recorded: 26.78 Nutritional Status: BMI 25 -29 Overweight Nutritional Risks: None Diabetes: Yes CBG done?: Yes (FBS 142 per patient) CBG resulted in Enter/ Edit results?: No Did pt. bring in CBG monitor from home?: No  Lab Results  Component Value Date   HGBA1C 6.9 02/21/2023   HGBA1C 6.6 (H) 04/11/2022   HGBA1C 6.7 (H) 03/16/2022     How often do you need to have someone help you when you read instructions, pamphlets, or other written materials from your doctor or pharmacy?: 1 - Never  Interpreter Needed?: No  Information entered by :: R. Hamsa Laurich LPN   Activities of Daily Living     07/24/2023    1:10 PM  In your present state of health, do you have any difficulty performing the following activities:  Hearing? 0  Vision? 1  Comment glasses, double visiion being seen by MD  Difficulty concentrating or making decisions? 0  Walking or climbing stairs? 1  Dressing or bathing? 0  Doing errands, shopping? 0  Comment wife goes with him  Preparing Food and eating ? N  Using the Toilet? N  In the past six months, have you accidently leaked urine? N  Do you have problems with loss of bowel control? N  Managing your Medications? N  Managing your Finances? N  Housekeeping or managing your Housekeeping? N    Patient Care Team: Thersia Flax, MD as PCP - General (Internal Medicine) Gollan, Timothy J, MD as Consulting Physician (Cardiology) Green, Davina E, RN as [facility] Care Management  Indicate any recent Medical Services you may have received from other than  [facility] providers in the past year (date may be approximate).     Assessment:    This is a routine wellness examination for [name] [name].  Hearing/Vision screen Hearing Screening - Comments:: No issues Vision Screening - Comments:: Glasses, double vision   Goals Addressed              This Visit's Progress    Patient Stated       Wants to get his vision back to normal       Depression Screen     07/24/2023    1:21 PM 05/01/2023    3:17 PM 02/15/2023    3:27 PM 11/16/2022    3:21 PM 07/19/2022    2:05 PM 05/18/2022   12:14 PM 03/31/2022    4:10 PM  PHQ 2/9 Scores  PHQ - 2 Score 0 0 0 0 0 0 1  PHQ- 9 Score 3  0 3  0     Fall Risk     07/24/2023    1:15 PM 05/01/2023    3:17 PM 02/15/2023    3:27 PM 11/16/2022    3:21 PM 07/19/2022    2:04 PM  Fall Risk   Falls in the past year? 0 0 0 1 0  Number falls in past yr: 0 0 0 0 0  Injury with Fall? 0 0 0 0 0  Risk for fall due to : No Fall Risks No Fall Risks No Fall Risks History of fall(s) No Fall Risks  Follow up Falls prevention discussed;Falls evaluation completed Falls evaluation completed Falls evaluation completed Falls evaluation completed Falls prevention discussed;Falls evaluation completed    MEDICARE RISK AT HOME:  Medicare Risk at Home Any stairs in or around the home?: Yes If so, are there any without handrails?: No Home free of loose throw rugs in walkways, pet beds, electrical cords, etc?: Yes Adequate lighting in your home to reduce risk of falls?: Yes Life alert?: No Use of a cane, walker or w/c?: Yes Grab bars in the bathroom?: No Shower chair or bench in shower?: Yes Elevated toilet seat or a handicapped toilet?: No  TIMED UP AND GO:  Was the test performed?  No  Cognitive Function: 6CIT completed        07/24/2023    1:26 PM 07/19/2022    2:00 PM  6CIT Screen  What Year? 0 points 0 points  What month? 0 points 0 points  What time? 0 points 0 points  Count back from 20 0 points 0 points  Months in reverse 0 points 0 points  Repeat phrase 2 points 0 points  Total Score 2 points 0 points    Immunizations Immunization History  Administered Date(s) Administered   Influenza Split 12/24/2010   Influenza Whole 12/05/2012   Influenza, High Dose Seasonal PF 01/02/2015,  11/09/2016, 12/14/2017, 12/19/2018   Influenza, Seasonal, Injecte, Preservative Fre 12/11/2007   Influenza-Unspecified 12/27/2013, 01/02/2015, 12/24/2019   PFIZER(Purple Top)SARS-COV-2 Vaccination 03/28/2019, 04/18/2019, 12/16/2019   Pneumococcal Conjugate-13 02/05/2014, 01/02/2015   Pneumococcal Polysaccharide-23 03/07/2010   Td 06/13/2012, 09/02/2021   Zoster Recombinant(Shingrix) 11/13/2017, 03/04/2019   Zoster, Live 12/01/2010    Screening Tests Health Maintenance  Topic Date Due   OPHTHALMOLOGY EXAM  04/21/2022   COVID-19 Vaccine (4 - 2024-25 season) 11/06/2022   Medicare Annual Wellness (AWV)  07/19/2023   HEMOGLOBIN A1C  08/22/2023   INFLUENZA VACCINE  10/06/2023   FOOT EXAM  02/15/2024   DTaP/Tdap/Td (3 - Tdap) 09/03/2031   Pneumonia Vaccine 65+  Years old  Completed   Zoster Vaccines- Shingrix  Completed   HPV VACCINES  Aged Out   Meningococcal B Vaccine  Aged Out    Health Maintenance  Health Maintenance Due  Topic Date Due   OPHTHALMOLOGY EXAM  04/21/2022   COVID-19 Vaccine (4 - 2024-25 season) 11/06/2022   Medicare Annual Wellness (AWV)  07/19/2023   Health Maintenance Items Addressed: Patient declines covid vaccines. Disucssed the need to update flu vaccine annually.  Additional Screening:  Vision Screening: Recommended annual ophthalmology exams for early detection of glaucoma and other disorders of the eye.  Up to Date, [facility]. Will request last office notes  Dental Screening: Recommended annual dental exams for proper oral hygiene  Community Resource Referral / Chronic Care Management: CRR required this visit?  No   CCM required this visit?  No   Plan:    I have personally reviewed and noted the following in the patient's chart:   Medical and social history Use of alcohol, tobacco or illicit drugs  Current medications and supplements including opioid prescriptions. Patient is not currently taking opioid prescriptions. Functional ability  and status Nutritional status Physical activity Advanced directives List of other physicians Hospitalizations, surgeries, and ER visits in previous 12 months Vitals Screenings to include cognitive, depression, and falls Referrals and appointments  In addition, I have reviewed and discussed with patient certain preventive protocols, quality metrics, and best practice recommendations. A written personalized care plan for preventive services as well as general preventive health recommendations were provided to patient.   Felicitas Horse, LPN   06/06/270   After Visit Summary: (MyChart) Due to this being a telephonic visit, the after visit summary with patients personalized plan was offered to patient via MyChart   Notes: Please refer to Routing Comments.

## 2023-07-25 ENCOUNTER — Other Ambulatory Visit: Payer: Self-pay

## 2023-07-25 ENCOUNTER — Ambulatory Visit: Payer: Self-pay

## 2023-07-25 NOTE — Patient Outreach (Signed)
 Complex Care Management   Visit Note  07/25/2023  Name:  [name] [name] MRN: [id number] DOB: Jan 28, 1934  Situation: Referral received for Complex Care Management related to health conditions I obtained verbal consent from Patient.  Visit completed with patient  on the phone.  Background:   Past Medical History:  Diagnosis Date   (HFpEF) heart failure with preserved ejection fraction (HCC)    a. 06/2007 Echo: nl systolic fxn, mild LVH, diastolic relaxation abnormality, mildly enlarged LA, mild Ao insufficiency; 01/2018 Echo: EF 55-60%, no rwma, GR2 DD, triv AI, mildly dil LA, mild inc PASP.   AMS (altered mental status) 02/03/2021   BPH (benign prostatic hyperplasia)    Cancer (HCC)    skin   Cataract    Choledocholithiasis    a. 05/2020 ERCP w/ CBD stone removal and biliary sphincterotomy.   Coronary artery disease    a. 01/2005 s/p CABG x 4: LIMA-LAD, VG-Diag, VG-OM3, VG-dRCA; b. 06/2013 Cath: patent grafts; c. 09/2015 Cath: VG->Diag 100, other grafts patent. LAD 100, RCA sev dzs->Med rx; d. 01/2018 Cath: LAD 100ost/p, LCX 50, RCA 100p/d, VG->OM3 100, VG->Diag 100, LIMA->LAD ok, VG->RPAV 22m-->Med rx.   COVID-19    07/2020 had paxlovid    Dyspnea    with exertion   Dysrhythmia    ED (erectile dysfunction)    Elevated LFTs    a. 05/2020 following CBD stone. 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   GERD (gastroesophageal reflux disease)    Hyperlipidemia    Hypertension    IDDM (insulin  dependent diabetes mellitus) 2000   Osteoarthritis    PAH (pulmonary artery hypertension) (HCC)    a. 01/2018 RHC: PA 51/22 (32).   Perirectal fistula    Pneumonia 12/2016   Shingles 09/04/2018   Tubular adenoma of colon 07/2012   Vertigo     Assessment: Patient Reported Symptoms:  Cognitive Cognitive Status: Alert and oriented to person, place, and time, Insightful and able to interpret abstract concepts, Normal speech and language  skills Cognitive/Intellectual Conditions Management [RPT]: None reported or documented in medical history or problem list   Health Maintenance Behaviors: Annual physical exam Healing Pattern: Average  Neurological Neurological Review of Symptoms: Vision changes Neurological Conditions: Stroke, ischemic (left sided stroke 01/2022. Still have some mild weakness on left side.) Neurological Management Strategies: Medication therapy, Routine screening  HEENT HEENT Symptoms Reported: Sudden change or loss of vision HEENT Conditions: Vision problem(s) Vision Problems:  (diplopia/ macular degeneration) HEENT Management Strategies: Routine screening, Medication therapy HEENT Self-Management Outcome: 3 (uncertain) HEENT Comment: Patient states he was recently diagnosed with diplopia. He reports seeing several opthalmologist without resolution. Patient reports seeing Dr. Jordis Nevins , opthalmologist on 07/13/23.  He states blood panel was done however he has not heard back on results. Per chart review patient is scheduled for follow up with Dr. Jordis Nevins on 08/01/23. Patient reports diplopia in right eye. He states instead of wearing patches he is now wearing glasses with a right frosted lense. Vision problem(s)  Cardiovascular Cardiovascular Symptoms Reported: No symptoms reported Does patient have uncontrolled Hypertension?: No Cardiovascular Conditions: Coronary artery disease, Heart failure, Hypertension (Atrial fibrillation) Cardiovascular Management Strategies: Routine screening, Medication therapy, Diet modification Weight: 203 lb (92.1 kg) Cardiovascular Self-Management Outcome: 4 (good)  Respiratory Respiratory Symptoms Reported: Shortness of breath Additional Respiratory Details: patient reports mild shortness of breath with exertion. Respiratory Conditions: Shortness of breath  Endocrine Patient reports the following symptoms related to hypoglycemia or hyperglycemia : No  symptoms reported Is  patient diabetic?: No Endocrine Conditions: Diabetes Endocrine Management Strategies: Routine screening, Medication therapy, Diet modification Endocrine Comment: Per chart review patients most recent Hgb A1c 7.4 up from 6.9% in December 2024.  Patient reports having follow up visit with endocrinologist on 07/04/23. He reports medication adjustment with humalog to 4 units with breakfast.  He reports using free style libre 3 to monitor blood sugars and monitors 3-4 xper day.  Patient reports blood sugars range from 100's to 220's.  Gastrointestinal Gastrointestinal Symptoms Reported: No symptoms reported Gastrointestinal Conditions: Other Other Gastrointestinal Conditions: GERD Gastrointestinal Management Strategies: Medication therapy (routine follow up) Gastrointestinal Self-Management Outcome: 4 (good)    Genitourinary Genitourinary Symptoms Reported: No symptoms reported    Integumentary Integumentary Symptoms Reported: No symptoms reported    Musculoskeletal Musculoskelatal Symptoms Reviewed: Difficulty walking, Weakness, Other Other Musculoskeletal Symptoms: limping. Additional Musculoskeletal Details: patient reports having ongoing right knee pain. Report having initial orthopedic appointment on 07/18/23 for evaluation and had steroid injection to knee.  Patient states injection relieved pain for approximately 2 days however pain has returned. He states he now has to use walker and wear knee brace when ambulating. Musculoskeletal Conditions: Other Other Musculoskeletal Conditions: left shoulder / right knee pain. Musculoskeletal Management Strategies: Routine screening, Medication therapy Falls in the past year?: No Number of falls in past year: 1 or less Was there an injury with Fall?: No Fall Risk Category Calculator: 0 Patient Fall Risk Level: Low Fall Risk Patient at Risk for Falls Due to: Impaired mobility  Psychosocial Psychosocial Symptoms Reported: No symptoms reported      Quality of Family Relationships: supportive Do you feel physically threatened by others?: No      07/25/2023   11:45 AM  Depression screen PHQ 2/9  Decreased Interest 0  Down, Depressed, Hopeless 0  PHQ - 2 Score 0    Vitals:   07/25/23 1131  BP: 124/78    Medications Reviewed Today     Reviewed by Tyreona Panjwani E, RN (Registered Nurse) on 07/25/23 at 1136  Med List Status: <None>   Medication Order Taking? Sig Documenting Provider Last Dose Status Informant  acetaminophen  (TYLENOL ) 325 MG tablet [id number] Yes Take by mouth. [provider] Taking Active   azelastine  (ASTELIN ) 0.1 % nasal spray [id number] Yes Place 2 sprays into both nostrils 2 (two) times daily. Use in each nostril as directed Kent Pear, MD Taking Active            Med Note Marrie Sizer, Yoan Sallade E   Wed Apr 26, 2023 11:24 AM) Patient takes differently.  Uses as needed  BD PEN NEEDLE NANO U/F 32G X 4 MM MISC [id number]  USE AS DIRECTED Kent Pear, MD  Active   carvedilol  (COREG ) 12.5 MG tablet [id number] Yes TAKE ONE (1) TABLET BY MOUTH TWO TIMES PER DAY  Patient taking differently: Take 12.5 mg by mouth 2 (two) times daily with a meal.   Gollan, Timothy J, MD Taking Active Self, Pharmacy Records  Cholecalciferol  (VITAMIN D3) 50 MCG (2000 UT) TABS [id number] Yes Take 2,000 Units by mouth in the morning. [provider] Taking Active Self, Pharmacy Records  clotrimazole  (LOTRIMIN ) 1 % cream [id number] Yes Apply 1 Application topically 2 (two) times daily. [provider] Taking Active Self           Med Note (LAWS, REGINA C   Mon Jul 24, 2023  1:17 PM) As needed  dapagliflozin  propanediol (FARXIGA ) 10 MG TABS tablet [id number]  Yes Take by mouth. [provider] Taking Active   doxazosin  (CARDURA ) 2 MG tablet [id number] Yes Take 1 tablet (2 mg total) by mouth 2 (two) times daily. Please keep upcoming appt for future refills Gollan, Timothy J, MD Taking Active Self, Pharmacy  Records  ELIQUIS  2.5 MG TABS tablet [id number] Yes Take 2.5 mg by mouth 2 (two) times daily. [provider] Taking Active   ezetimibe  (ZETIA ) 10 MG tablet [id number] Yes TAKE 1 TABLET BY MOUTH DAILY Gollan, Timothy J, MD Taking Active Self, Pharmacy Records  finasteride  (PROSCAR ) 5 MG tablet [id number] Yes Take 1 tablet (5 mg total) by mouth daily.  Patient taking differently: Take 5 mg by mouth every evening.   Kent Pear, MD Taking Active Self, Pharmacy Records  furosemide  (LASIX ) 20 MG tablet [id number] Yes Take 20 mg by mouth. Patient states he takes as needed depending on weight gain as recommended by provider [provider] Taking Active Self           Med Note Marrie Sizer, Kaydence Baba E   Wed Apr 26, 2023 11:26 AM) Patient takes differently. Takes as needed.   gabapentin  (NEURONTIN ) 100 MG capsule [id number] Yes TAKE 1 CAPSULE BY MOUTH TWO TIMES DAILY Thersia Flax, MD Taking Active   HUMALOG KWIKPEN 100 UNIT/ML KwikPen [id number] Yes Inject into the skin. 4 units am and then sliding scale [provider] Taking Active   hydrALAZINE  (APRESOLINE ) 25 MG tablet [id number] Yes Take 1 tablet (25 mg total) by mouth 3 (three) times daily. Kent Pear, MD Taking Active Self, Pharmacy Records           Med Note Marrie Sizer, [facility] E   Wed Apr 26, 2023 11:27 AM) Patient taking differently. Takes 1 pill 2 x per day.   insulin  degludec (TRESIBA FLEXTOUCH) 100 UNIT/ML FlexTouch Pen [id number] Yes Inject into the skin daily. Patient takes 28-30 units 1 x per day. 115-125 takes 28 units, > 125 can take 29-30 units [provider] Taking Active            Med Note Marrie Sizer, Anoop Hemmer E   Tue Jul 25, 2023 11:35 AM) Patient states he takes 24 units.   isosorbide  mononitrate (IMDUR ) 30 MG 24 hr tablet [id number] Yes Take 1 tablet (30 mg total) by mouth daily. Please call [phone] for a sooner appointment and for further refills. Florette Hurry, NP Taking Active Self,  Pharmacy Records  loperamide  (IMODIUM  A-D) 2 MG tablet [id number] Yes Take 1 tablet (2 mg total) by mouth 3 (three) times daily as needed for diarrhea or loose stools. Art Bigness, PA Taking Active   Multiple Vitamins-Minerals (PRESERVISION AREDS 2 PO) [id number] Yes Take 1 tablet by mouth in the morning and at bedtime. [provider] Taking Active Self, Pharmacy Records  omeprazole  (PRILOSEC) 20 MG capsule [id number] Yes TAKE 1 CAPSULE (20 MG) BY MOUTH TWICE DAILY Thersia Flax, MD Taking Active   ondansetron  (ZOFRAN -ODT) 4 MG disintegrating tablet [id number] Yes Take 1 tablet (4 mg total) by mouth every 8 (eight) hours as needed for nausea or vomiting. Art Bigness, PA Taking Active   potassium chloride  (KLOR-CON ) 10 MEQ tablet [id number] Yes Take 1 tablet (10 mEq total) by mouth daily as needed. Take with torsemide  Gollan, Timothy J, MD Taking Active Self, Pharmacy Records           Med Note Marrie Sizer, Linton Richter Mar 31, 2022  1:24 PM) Patient states he takes potassium tablet  when he takes a lasix .   rosuvastatin  (CRESTOR ) 20 MG tablet [id number] Yes Take 20 mg by mouth daily. [provider] Taking Active   torsemide  (DEMADEX ) 20 MG tablet [id number] Yes FOR WEIGHT 210-215 TAKE 1 TABLET WITH POTASSIUM  FOR WEIGHT GREATER THAN 215 TAKE 1 TABLET TWICE A DAY (8AM & 2PM) WITH 2 POTASSIUM Sonnenberg, Robbi Childs, MD Taking Active             Recommendation:   PCP Follow-up  Follow Up Plan:   Telephone follow-up in 1 month   Patient informed he will be transferred to Michele Ahle, RN case manager for ongoing follow up.  Verba Girt RN, BSN, CCM [employer], Population Health Case Manager Phone: [phone]

## 2023-07-25 NOTE — Patient Instructions (Signed)
 Visit Information  Thank you for taking time to visit with me today. Please don't hesitate to contact me if I can be of assistance to you before our next scheduled appointment.  Our next appointment is by telephone on 08/16/23  at 2 pm Please call the care guide team at [phone] if you need to cancel or reschedule your appointment.   Following is a copy of your care plan:   Goals Addressed             This Visit's Progress    COMPLETED: Development of plan of care for management of diabetes and new onset shortness of breath/ health conditions       Interventions Today    Flowsheet Row Most Recent Value  Chronic Disease   Chronic disease during today's visit Congestive Heart Failure (CHF), Atrial Fibrillation (AFib), Other  [left breast tenderness.]  General Interventions   General Interventions Discussed/Reviewed General Interventions Reviewed, Labs, Doctor Visits  [evaluation of treatment plan for listed health conditions and patients adherence to plan as established by provider. Assessed for HF/ atrial fibrillation symptoms and discussed new symptoms of left breast tenderness.]  Labs Kidney Function  [discussed recent renal panel lab and improving GFR.]  Doctor Visits Discussed/Reviewed Doctor Visits Reviewed  [reviewed upcoming provider visits. Advised to keep follow up visits with providers as recommended.]  Exercise Interventions   Exercise Discussed/Reviewed Exercise Reviewed  [assessed exercise routine.Applauded patient for his consistency with exercising.]  Education Interventions   Education Provided Provided Education  [Advised importance of staying hyrdated and not taking NSAIDS  to support kidney health, Reviewed HF/ atrial fibrillaiton symptoms. Advised to nofity provider for frequent blood sugars <70 or >250.]  Provided Verbal Education On Other  [Advised to not keep cell phone number pillow or under covers when sleeping due to dangers of phone over heating.]  Nutrition  Interventions   Nutrition Discussed/Reviewed Nutrition Reviewed, Carbohydrate meal planning  [Advised ongoing adherence to low carbohydrate, heart healthy diet.]  Pharmacy Interventions   Pharmacy Dicussed/Reviewed Pharmacy Topics Reviewed  [medications reviewed. Discussed and advised ongoing compliance with medications. Advised to follow up with endocrinology office to renew medication assistance for Farxiga .]           VBCI RN Care Plan- diabetes       Problems:  Chronic Disease Management support and education needs related to DMII  Goal: Over the next 3 months the Patient will attend all scheduled medical appointments: with provider as evidenced by patient report/ chart review.         continue to work with Medical illustrator and/or Social Worker to address care management and care coordination needs related to DMII as evidenced by adherence to care management team scheduled appointments     demonstrate Ongoing adherence to prescribed treatment plan for DMII as evidenced by patient report/ chart review.  take all medications exactly as prescribed and will call provider for medication related questions as evidenced by patient report /chart review.      Interventions:   Diabetes Interventions: Assessed patient's understanding of A1c goal: <7% Reviewed medications with patient and discussed importance of medication adherence Discussed plans with patient for ongoing care management follow up and provided patient with direct contact information for care management team Reviewed scheduled/upcoming provider appointments including:   Assessed social determinant of health barriers Assessed blood sugar readings.  Advised to decrease carbohydrates at breakfast.  Food choices discussed.  Reviewed recent medication change and confirmed patient taking medications as prescribed.  Advised to notify provider of blood sugars <70 and/ or >250  Lab Results  Component Value Date   HGBA1C 6.9 02/21/2023     Patient Self-Care Activities:  Attend all scheduled provider appointments Call pharmacy for medication refills 3-7 days in advance of running out of medications Call provider office for new concerns or questions  Take medications as prescribed   check blood sugar at prescribed times: before meals and at bedtime and when you have symptoms of low or high blood sugar Reports frequent blood sugars of <70 to >250 to provider Decrease some carbohydrate food choices at breakfast.  Confirmed patient following recent medication dose change to humalog as prescribed.  Plan:  Telephone follow up appointment with care management team member scheduled for:  08/16/23 at 2 pm          VBCI RN Care Plan- diplopia       Problems:  Chronic Disease Management support and education needs related to diplopia  Goal: Over the next 3 months the Patient will attend all scheduled medical appointments: with providers as evidenced by patient report/ chart review.         continue to work with Medical illustrator and/or Social Worker to address care management and care coordination needs related to diplopia as evidenced by adherence to care management team scheduled appointments     demonstrate Ongoing adherence to prescribed treatment plan for diplopia as evidenced by patient report/ chart review take all medications exactly as prescribed and will call provider for medication related questions as evidenced by patient report /chart review.      Interventions:   Evaluation of current treatment plan related to diplopia,  self-management and patient's adherence to plan as established by provider. Discussed plans with patient for ongoing care management follow up and provided patient with direct contact information for care management team Reviewed medications with patient and discussed compliance Reviewed scheduled/upcoming provider appointments including   Discussed plans with patient for ongoing care management  follow up and provided patient with direct contact information for care management team Assessed social determinant of health barriers Informed patient recent blood panel completed for Myasthenia Gravis as per provider note/ chart review.  Discussed importance of fall prevention/ safety with diplopia Advised patient that diplopia can increase risk for falls and affect depth perception, balance and coordination, risk stumbling/ tripping, difficulty seeing hazards.  Advised to wear frosted lens glasses as recommended by provider.  Advised to use assistive device if needed when walking.   Patient Self-Care Activities:  Attend all scheduled provider appointments Call pharmacy for medication refills 3-7 days in advance of running out of medications Call provider office for new concerns or questions  Take medications as prescribed   diplopia can increase risk for falls and affect depth perception, balance and coordination, risk stumbling/ tripping, difficulty seeing hazards.  Use assisted device as needed when ambulating.   Plan:  Telephone follow up appointment with care management team member scheduled for:  08/16/23 at 2 pm          VBCI RN Care Plan- knee pain       Problems:  Chronic Disease Management support and education needs related to right knee pain   Goal: Over the next 3 months the Patient will attend all scheduled medical appointments: with providers as evidenced by patient report/ chart review        continue to work with RN Care Manager and/or Social Worker to address care management and care coordination needs  related to right knee pain as evidenced by adherence to care management team scheduled appointments     demonstrate Ongoing adherence to prescribed treatment plan for right knee pain as evidenced by patient report/ chart review. take all medications exactly as prescribed and will call provider for medication related questions as evidenced by report/ chart review.       Interventions:  Take medications as prescribed Assessed pain level  Reviewed upcoming provider visits.  Evaluation of current treatment plan for right knee pain and patients adherence to plan as established by provider.  Advised to observe knee for worsening symptoms and report to provider Advised to contact orthopedic office and notify them that recent knee injection did not help pain level  Advised to apply  ice/ heat to right knee for pain management.    Patient Self-Care Activities:  Attend all scheduled provider appointments Call pharmacy for medication refills 3-7 days in advance of running out of medications Call provider office for new concerns or questions  Take medications as prescribed   Apply ice/ heat to knee for pain management Call orthopedic office and report that recent knee injection is not helping pain   Plan:  Telephone follow up appointment with care management team member scheduled for:  08/16/23 at 2 pm             Please call the Suicide and Crisis Lifeline: 988 call 1-800-273-TALK (toll free, 24 hour hotline) if you are experiencing a Mental Health or Behavioral Health Crisis or need someone to talk to.  Patient verbalizes understanding of instructions and care plan provided today and agrees to view in MyChart. Active MyChart status and patient understanding of how to access instructions and care plan via MyChart confirmed with patient.     Verba Girt RN, BSN, CCM [employer], Population Health Case Manager Phone: [phone]

## 2023-07-27 ENCOUNTER — Other Ambulatory Visit: Payer: Self-pay | Admitting: Orthopedic Surgery

## 2023-07-27 DIAGNOSIS — M25561 Pain in right knee: Secondary | ICD-10-CM

## 2023-08-01 ENCOUNTER — Ambulatory Visit
Admission: RE | Admit: 2023-08-01 | Discharge: 2023-08-01 | Disposition: A | Discharge status: Home / Self Care | Source: Ambulatory Visit | Attending: Orthopedic Surgery | Admitting: Orthopedic Surgery

## 2023-08-01 DIAGNOSIS — M25561 Pain in right knee: Secondary | ICD-10-CM | POA: Diagnosis not present

## 2023-08-01 DIAGNOSIS — M25461 Effusion, right knee: Secondary | ICD-10-CM | POA: Diagnosis not present

## 2023-08-01 DIAGNOSIS — R609 Edema, unspecified: Secondary | ICD-10-CM | POA: Diagnosis not present

## 2023-08-01 DIAGNOSIS — S83281A Other tear of lateral meniscus, current injury, right knee, initial encounter: Secondary | ICD-10-CM | POA: Diagnosis not present

## 2023-08-01 DIAGNOSIS — H532 Diplopia: Secondary | ICD-10-CM | POA: Diagnosis not present

## 2023-08-01 DIAGNOSIS — M2241 Chondromalacia patellae, right knee: Secondary | ICD-10-CM | POA: Diagnosis not present

## 2023-08-06 DIAGNOSIS — I7090 Unspecified atherosclerosis: Secondary | ICD-10-CM | POA: Diagnosis not present

## 2023-08-06 DIAGNOSIS — J3489 Other specified disorders of nose and nasal sinuses: Secondary | ICD-10-CM | POA: Diagnosis not present

## 2023-08-06 DIAGNOSIS — I7789 Other specified disorders of arteries and arterioles: Secondary | ICD-10-CM | POA: Diagnosis not present

## 2023-08-06 DIAGNOSIS — H532 Diplopia: Secondary | ICD-10-CM | POA: Diagnosis not present

## 2023-08-08 DIAGNOSIS — I4891 Unspecified atrial fibrillation: Secondary | ICD-10-CM | POA: Diagnosis not present

## 2023-08-08 DIAGNOSIS — I5030 Unspecified diastolic (congestive) heart failure: Secondary | ICD-10-CM | POA: Diagnosis not present

## 2023-08-08 DIAGNOSIS — I4892 Unspecified atrial flutter: Secondary | ICD-10-CM | POA: Diagnosis not present

## 2023-08-08 DIAGNOSIS — Z7901 Long term (current) use of anticoagulants: Secondary | ICD-10-CM | POA: Diagnosis not present

## 2023-08-08 DIAGNOSIS — I480 Paroxysmal atrial fibrillation: Secondary | ICD-10-CM | POA: Diagnosis not present

## 2023-08-15 DIAGNOSIS — J323 Chronic sphenoidal sinusitis: Secondary | ICD-10-CM | POA: Diagnosis not present

## 2023-08-16 ENCOUNTER — Other Ambulatory Visit: Payer: Self-pay | Admitting: *Deleted

## 2023-08-16 ENCOUNTER — Encounter: Payer: Self-pay | Admitting: *Deleted

## 2023-08-17 ENCOUNTER — Other Ambulatory Visit: Payer: Self-pay | Admitting: *Deleted

## 2023-08-23 DIAGNOSIS — E1140 Type 2 diabetes mellitus with diabetic neuropathy, unspecified: Secondary | ICD-10-CM | POA: Diagnosis not present

## 2023-08-23 DIAGNOSIS — E1122 Type 2 diabetes mellitus with diabetic chronic kidney disease: Secondary | ICD-10-CM | POA: Diagnosis not present

## 2023-08-23 DIAGNOSIS — Z794 Long term (current) use of insulin: Secondary | ICD-10-CM | POA: Diagnosis not present

## 2023-08-23 DIAGNOSIS — E119 Type 2 diabetes mellitus without complications: Secondary | ICD-10-CM | POA: Diagnosis not present

## 2023-08-28 DIAGNOSIS — Z7901 Long term (current) use of anticoagulants: Secondary | ICD-10-CM | POA: Diagnosis not present

## 2023-08-28 DIAGNOSIS — Z951 Presence of aortocoronary bypass graft: Secondary | ICD-10-CM | POA: Diagnosis not present

## 2023-08-28 DIAGNOSIS — I2510 Atherosclerotic heart disease of native coronary artery without angina pectoris: Secondary | ICD-10-CM | POA: Diagnosis not present

## 2023-08-28 DIAGNOSIS — E1159 Type 2 diabetes mellitus with other circulatory complications: Secondary | ICD-10-CM | POA: Diagnosis not present

## 2023-08-28 DIAGNOSIS — Z8673 Personal history of transient ischemic attack (TIA), and cerebral infarction without residual deficits: Secondary | ICD-10-CM | POA: Diagnosis not present

## 2023-08-28 DIAGNOSIS — G4733 Obstructive sleep apnea (adult) (pediatric): Secondary | ICD-10-CM | POA: Diagnosis not present

## 2023-08-28 DIAGNOSIS — I10 Essential (primary) hypertension: Secondary | ICD-10-CM | POA: Diagnosis not present

## 2023-08-28 DIAGNOSIS — E1169 Type 2 diabetes mellitus with other specified complication: Secondary | ICD-10-CM | POA: Diagnosis not present

## 2023-08-28 DIAGNOSIS — I4892 Unspecified atrial flutter: Secondary | ICD-10-CM | POA: Diagnosis not present

## 2023-08-28 DIAGNOSIS — I5032 Chronic diastolic (congestive) heart failure: Secondary | ICD-10-CM | POA: Diagnosis not present

## 2023-08-28 DIAGNOSIS — I480 Paroxysmal atrial fibrillation: Secondary | ICD-10-CM | POA: Diagnosis not present

## 2023-09-01 DIAGNOSIS — Z539 Procedure and treatment not carried out, unspecified reason: Secondary | ICD-10-CM | POA: Diagnosis not present

## 2023-09-01 DIAGNOSIS — J342 Deviated nasal septum: Secondary | ICD-10-CM | POA: Diagnosis not present

## 2023-09-01 DIAGNOSIS — J323 Chronic sphenoidal sinusitis: Secondary | ICD-10-CM | POA: Diagnosis not present

## 2023-09-01 DIAGNOSIS — Z01812 Encounter for preprocedural laboratory examination: Secondary | ICD-10-CM | POA: Diagnosis not present

## 2023-09-06 ENCOUNTER — Other Ambulatory Visit: Payer: Self-pay | Admitting: Internal Medicine

## 2023-09-11 DIAGNOSIS — E1165 Type 2 diabetes mellitus with hyperglycemia: Secondary | ICD-10-CM | POA: Diagnosis not present

## 2023-09-15 ENCOUNTER — Other Ambulatory Visit: Payer: Self-pay | Admitting: *Deleted

## 2023-09-15 ENCOUNTER — Encounter: Payer: Self-pay | Admitting: *Deleted

## 2023-09-15 NOTE — Patient Outreach (Signed)
 Complex Care Management   Visit Note  09/15/2023  Name:  [name] [name] MRN: [id number] DOB: 04/18/1933  Situation: Referral received for Complex Care Management related to Diabetes with Complications. I obtained verbal consent from Patient.  Visit completed with patient on the phone  Background:   Past Medical History:  Diagnosis Date   (HFpEF) heart failure with preserved ejection fraction (HCC)    a. 06/2007 Echo: nl systolic fxn, mild LVH, diastolic relaxation abnormality, mildly enlarged LA, mild Ao insufficiency; 01/2018 Echo: EF 55-60%, no rwma, GR2 DD, triv AI, mildly dil LA, mild inc PASP.   AMS (altered mental status) 02/03/2021   BPH (benign prostatic hyperplasia)    Cancer (HCC)    skin   Cataract    Choledocholithiasis    a. 05/2020 ERCP w/ CBD stone removal and biliary sphincterotomy.   Coronary artery disease    a. 01/2005 s/p CABG x 4: LIMA-LAD, VG-Diag, VG-OM3, VG-dRCA; b. 06/2013 Cath: patent grafts; c. 09/2015 Cath: VG->Diag 100, other grafts patent. LAD 100, RCA sev dzs->Med rx; d. 01/2018 Cath: LAD 100ost/p, LCX 50, RCA 100p/d, VG->OM3 100, VG->Diag 100, LIMA->LAD ok, VG->RPAV 37m-->Med rx.   COVID-19    07/2020 had paxlovid    Dyspnea    with exertion   Dysrhythmia    ED (erectile dysfunction)    Elevated LFTs    a. 05/2020 following CBD stone. 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   GERD (gastroesophageal reflux disease)    Hyperlipidemia    Hypertension    IDDM (insulin  dependent diabetes mellitus) 2000   Osteoarthritis    PAH (pulmonary artery hypertension) (HCC)    a. 01/2018 RHC: PA 51/22 (32).   Perirectal fistula    Pneumonia 12/2016   Shingles 09/04/2018   Tubular adenoma of colon 07/2012   Vertigo     Assessment: Patient Reported Symptoms:  Cognitive        Neurological      HEENT        Cardiovascular      Respiratory      Endocrine      Gastrointestinal        Genitourinary       Integumentary      Musculoskeletal          Psychosocial              07/25/2023   11:45 AM  Depression screen PHQ 2/9  Decreased Interest 0  Down, Depressed, Hopeless 0  PHQ - 2 Score 0    There were no vitals filed for this visit.  Medications Reviewed Today     Reviewed by Charlsie Josette SAILOR, RN (Registered Nurse) on 09/15/23 at 1724  Med List Status: <None>   Medication Order Taking? Sig Documenting Provider Last Dose Status Informant  acetaminophen  (TYLENOL ) 325 MG tablet [id number]  Take by mouth. [provider]  Active   azelastine  (ASTELIN ) 0.1 % nasal spray [id number]  Place 2 sprays into both nostrils 2 (two) times daily. Use in each nostril as directed Maribeth Camellia MATSU, MD  Active            Med Note DORI, DAVINA E   Wed Apr 26, 2023 11:24 AM) Patient takes differently.  Uses as needed  BD PEN NEEDLE NANO U/F 32G X 4 MM MISC [id number]  USE AS DIRECTED Maribeth Camellia MATSU, MD  Active   carvedilol  (COREG ) 12.5 MG tablet [id number]  TAKE ONE (1) TABLET BY  MOUTH TWO TIMES PER DAY  Patient taking differently: Take 12.5 mg by mouth 2 (two) times daily with a meal.   Gollan, Timothy J, MD  Active Self, Pharmacy Records  Cholecalciferol  (VITAMIN D3) 50 MCG (2000 UT) TABS [id number]  Take 2,000 Units by mouth in the morning. [provider]  Active Self, Pharmacy Records  clotrimazole  (LOTRIMIN ) 1 % cream [id number]  Apply 1 Application topically 2 (two) times daily. [provider]  Active Self           Med Note (LAWS, REGINA C   Mon Jul 24, 2023  1:17 PM) As needed  dapagliflozin  propanediol (FARXIGA ) 10 MG TABS tablet [id number] Yes Take by mouth. [provider]  Active   doxazosin  (CARDURA ) 2 MG tablet [id number]  Take 1 tablet (2 mg total) by mouth 2 (two) times daily. Please keep upcoming appt for future refills Gollan, Timothy J, MD  Active Self, Pharmacy Records  ELIQUIS  2.5 MG TABS tablet [id number]  Take 2.5 mg by mouth 2 (two)  times daily. [provider]  Active   ezetimibe  (ZETIA ) 10 MG tablet [id number]  TAKE 1 TABLET BY MOUTH DAILY Gollan, Timothy J, MD  Active Self, Pharmacy Records  finasteride  (PROSCAR ) 5 MG tablet [id number]  Take 1 tablet (5 mg total) by mouth daily.  Patient taking differently: Take 5 mg by mouth every evening.   Maribeth Camellia MATSU, MD  Active Self, Pharmacy Records  furosemide  (LASIX ) 20 MG tablet [id number]  Take 20 mg by mouth. Patient states he takes as needed depending on weight gain as recommended by provider [provider]  Active Self           Med Note DORI, DAVINA E   Wed Apr 26, 2023 11:26 AM) Patient takes differently. Takes as needed.   gabapentin  (NEURONTIN ) 100 MG capsule [id number]  TAKE 1 CAPSULE BY MOUTH TWO TIMES DAILY Marylynn Verneita CROME, MD  Active   HUMALOG KWIKPEN 100 UNIT/ML KwikPen [id number] Yes Inject 6 Units into the skin 3 (three) times daily with meals. [provider]  Active Self  hydrALAZINE  (APRESOLINE ) 25 MG tablet [id number]  Take 1 tablet (25 mg total) by mouth 3 (three) times daily. Maribeth Camellia MATSU, MD  Active Self, Pharmacy Records           Med Note DORI, [facility] E   Wed Apr 26, 2023 11:27 AM) Patient taking differently. Takes 1 pill 2 x per day.   insulin  degludec (TRESIBA FLEXTOUCH) 100 UNIT/ML FlexTouch Pen [id number] Yes Inject into the skin daily. Patient takes 28-30 units 1 x per day. 115-125 takes 28 units, > 125 can take 29-30 units [provider]  Active            Med Note DORI, DAVINA E   Tue Jul 25, 2023 11:35 AM) Patient states he takes 24 units.   isosorbide  mononitrate (IMDUR ) 30 MG 24 hr tablet [id number]  Take 1 tablet (30 mg total) by mouth daily. Please call [phone] for a sooner appointment and for further refills. Vivienne Lonni Ingle, NP  Active Self, Pharmacy Records  loperamide  (IMODIUM  A-D) 2 MG tablet [id number]  Take 1 tablet (2 mg total) by mouth 3 (three) times daily as needed for  diarrhea or loose stools. Antonetta Penne Ruth, PA  Active   Multiple Vitamins-Minerals (PRESERVISION AREDS 2 PO) [id number]  Take 1 tablet by mouth in the morning and at bedtime. [provider]  Active Self, Pharmacy Records  omeprazole  [facility])  20 MG capsule [id number]  TAKE 1 CAPSULE (20 MG) BY MOUTH TWICE DAILY Marylynn Verneita CROME, MD  Active   ondansetron  (ZOFRAN -ODT) 4 MG disintegrating tablet [id number]  Take 1 tablet (4 mg total) by mouth every 8 (eight) hours as needed for nausea or vomiting. Antonetta Penne Ruth, PA  Active   potassium chloride  (KLOR-CON ) 10 MEQ tablet [id number]  Take 1 tablet (10 mEq total) by mouth daily as needed. Take with torsemide  Gollan, Timothy J, MD  Active Self, Pharmacy Records           Med Note DORI, ARVIN FORBES Schaumann Mar 31, 2022  1:24 PM) Patient states he takes potassium tablet when he takes a lasix .   rosuvastatin  (CRESTOR ) 20 MG tablet [id number]  Take 20 mg by mouth daily. [provider]  Active   torsemide  (DEMADEX ) 20 MG tablet [id number]  FOR WEIGHT 210-215 TAKE 1 TABLET WITH POTASSIUM  FOR WEIGHT GREATER THAN 215 TAKE 1 TABLET TWICE A DAY (8AM & 2PM) WITH 2 POTASSIUM Maribeth Camellia MATSU, MD  Active             Recommendation:   Continue Current Plan of Care  Follow Up Plan:   Follow-up with RNCM in 4 weeks  Josette Pellet, RN, BSN [facility] RN Care Manager Direct Dial : [phone]  Fax: [phone]

## 2023-09-16 ENCOUNTER — Emergency Department

## 2023-09-16 ENCOUNTER — Emergency Department
Admission: EM | Admit: 2023-09-16 | Discharge: 2023-09-16 | Disposition: A | Discharge status: Home / Self Care | Source: Ambulatory Visit | Attending: Emergency Medicine | Admitting: Emergency Medicine

## 2023-09-16 ENCOUNTER — Other Ambulatory Visit: Payer: Self-pay

## 2023-09-16 DIAGNOSIS — N3001 Acute cystitis with hematuria: Secondary | ICD-10-CM | POA: Insufficient documentation

## 2023-09-16 DIAGNOSIS — Z955 Presence of coronary angioplasty implant and graft: Secondary | ICD-10-CM | POA: Insufficient documentation

## 2023-09-16 DIAGNOSIS — I130 Hypertensive heart and chronic kidney disease with heart failure and stage 1 through stage 4 chronic kidney disease, or unspecified chronic kidney disease: Secondary | ICD-10-CM | POA: Insufficient documentation

## 2023-09-16 DIAGNOSIS — N183 Chronic kidney disease, stage 3 unspecified: Secondary | ICD-10-CM | POA: Insufficient documentation

## 2023-09-16 DIAGNOSIS — D72829 Elevated white blood cell count, unspecified: Secondary | ICD-10-CM | POA: Insufficient documentation

## 2023-09-16 DIAGNOSIS — I5032 Chronic diastolic (congestive) heart failure: Secondary | ICD-10-CM | POA: Insufficient documentation

## 2023-09-16 DIAGNOSIS — L03116 Cellulitis of left lower limb: Secondary | ICD-10-CM | POA: Diagnosis not present

## 2023-09-16 DIAGNOSIS — E1122 Type 2 diabetes mellitus with diabetic chronic kidney disease: Secondary | ICD-10-CM | POA: Diagnosis not present

## 2023-09-16 DIAGNOSIS — I2510 Atherosclerotic heart disease of native coronary artery without angina pectoris: Secondary | ICD-10-CM | POA: Insufficient documentation

## 2023-09-16 DIAGNOSIS — K5730 Diverticulosis of large intestine without perforation or abscess without bleeding: Secondary | ICD-10-CM | POA: Diagnosis not present

## 2023-09-16 DIAGNOSIS — R161 Splenomegaly, not elsewhere classified: Secondary | ICD-10-CM | POA: Diagnosis not present

## 2023-09-16 DIAGNOSIS — Z7901 Long term (current) use of anticoagulants: Secondary | ICD-10-CM | POA: Diagnosis not present

## 2023-09-16 DIAGNOSIS — R509 Fever, unspecified: Secondary | ICD-10-CM | POA: Diagnosis not present

## 2023-09-16 DIAGNOSIS — M7989 Other specified soft tissue disorders: Secondary | ICD-10-CM | POA: Diagnosis not present

## 2023-09-16 DIAGNOSIS — L539 Erythematous condition, unspecified: Secondary | ICD-10-CM | POA: Diagnosis not present

## 2023-09-16 DIAGNOSIS — M79606 Pain in leg, unspecified: Secondary | ICD-10-CM | POA: Diagnosis not present

## 2023-09-16 DIAGNOSIS — Z8616 Personal history of COVID-19: Secondary | ICD-10-CM | POA: Diagnosis not present

## 2023-09-16 DIAGNOSIS — I700 Atherosclerosis of aorta: Secondary | ICD-10-CM | POA: Diagnosis not present

## 2023-09-16 DIAGNOSIS — Z8673 Personal history of transient ischemic attack (TIA), and cerebral infarction without residual deficits: Secondary | ICD-10-CM | POA: Diagnosis not present

## 2023-09-16 DIAGNOSIS — R2242 Localized swelling, mass and lump, left lower limb: Secondary | ICD-10-CM | POA: Diagnosis present

## 2023-09-16 DIAGNOSIS — R39198 Other difficulties with micturition: Secondary | ICD-10-CM | POA: Diagnosis not present

## 2023-09-16 DIAGNOSIS — M79605 Pain in left leg: Secondary | ICD-10-CM | POA: Diagnosis not present

## 2023-09-16 HISTORY — DX: Heart failure, unspecified: I50.9

## 2023-09-16 LAB — CBC WITH DIFFERENTIAL/PLATELET
Abs Immature Granulocytes: 0.04 K/uL (ref 0.00–0.07)
Basophils Absolute: 0 K/uL (ref 0.0–0.1)
Basophils Relative: 0 %
Eosinophils Absolute: 0.1 K/uL (ref 0.0–0.5)
Eosinophils Relative: 1 %
HCT: 33.9 % — ABNORMAL LOW (ref 39.0–52.0)
Hemoglobin: 11.2 g/dL — ABNORMAL LOW (ref 13.0–17.0)
Immature Granulocytes: 0 %
Lymphocytes Relative: 11 %
Lymphs Abs: 1.2 K/uL (ref 0.7–4.0)
MCH: 32.7 pg (ref 26.0–34.0)
MCHC: 33 g/dL (ref 30.0–36.0)
MCV: 98.8 fL (ref 80.0–100.0)
Monocytes Absolute: 1 K/uL (ref 0.1–1.0)
Monocytes Relative: 9 %
Neutro Abs: 8.5 K/uL — ABNORMAL HIGH (ref 1.7–7.7)
Neutrophils Relative %: 79 %
Platelets: 66 K/uL — ABNORMAL LOW (ref 150–400)
RBC: 3.43 MIL/uL — ABNORMAL LOW (ref 4.22–5.81)
RDW: 13 % (ref 11.5–15.5)
WBC: 10.8 K/uL — ABNORMAL HIGH (ref 4.0–10.5)
nRBC: 0 % (ref 0.0–0.2)

## 2023-09-16 LAB — RESP PANEL BY RT-PCR (RSV, FLU A&B, COVID)  RVPGX2
Influenza A by PCR: NEGATIVE
Influenza B by PCR: NEGATIVE
Resp Syncytial Virus by PCR: NEGATIVE
SARS Coronavirus 2 by RT PCR: NEGATIVE

## 2023-09-16 LAB — COMPREHENSIVE METABOLIC PANEL WITH GFR
ALT: 16 U/L (ref 0–44)
AST: 22 U/L (ref 15–41)
Albumin: 3.4 g/dL — ABNORMAL LOW (ref 3.5–5.0)
Alkaline Phosphatase: 91 U/L (ref 38–126)
Anion gap: 6 (ref 5–15)
BUN: 38 mg/dL — ABNORMAL HIGH (ref 8–23)
CO2: 22 mmol/L (ref 22–32)
Calcium: 8.8 mg/dL — ABNORMAL LOW (ref 8.9–10.3)
Chloride: 110 mmol/L (ref 98–111)
Creatinine, Ser: 1.67 mg/dL — ABNORMAL HIGH (ref 0.61–1.24)
GFR, Estimated: 39 mL/min — ABNORMAL LOW (ref >60)
Glucose, Bld: 167 mg/dL — ABNORMAL HIGH (ref 70–99)
Potassium: 4.7 mmol/L (ref 3.5–5.1)
Sodium: 138 mmol/L (ref 135–145)
Total Bilirubin: 1.2 mg/dL (ref 0.0–1.2)
Total Protein: 5.8 g/dL — ABNORMAL LOW (ref 6.5–8.1)

## 2023-09-16 LAB — URINALYSIS, ROUTINE W REFLEX MICROSCOPIC
Bilirubin Urine: NEGATIVE
Glucose, UA: >=500 mg/dL — AB
Ketones, ur: NEGATIVE mg/dL
Leukocytes,Ua: NEGATIVE
Nitrite: NEGATIVE
Protein, ur: 30 mg/dL — AB
RBC / HPF: >50 RBC/hpf (ref 0–5)
Specific Gravity, Urine: 1.01 (ref 1.005–1.030)
Squamous Epithelial / HPF: 0 /HPF (ref 0–5)
pH: 5 (ref 5.0–8.0)

## 2023-09-16 LAB — LACTIC ACID, PLASMA
Lactic Acid, Venous: 1 mmol/L (ref 0.5–1.9)
Lactic Acid, Venous: 0.7 mmol/L (ref 0.5–1.9)

## 2023-09-16 MED ORDER — CEFDINIR 300 MG PO CAPS
300.0000 mg | ORAL_CAPSULE | Freq: Two times a day (BID) | ORAL | 0 refills | Status: AC
Start: 2023-09-16 — End: 2023-09-26

## 2023-09-16 MED ORDER — SODIUM CHLORIDE 0.9 % IV SOLN
2.0000 g | INTRAVENOUS | Status: DC
  Administered 2023-09-16: 2 g via INTRAVENOUS
  Filled 2023-09-16 (×2): qty 20

## 2023-09-16 MED ORDER — DOXYCYCLINE MONOHYDRATE 100 MG PO TABS: 100.0000 mg | ORAL_TABLET | Freq: Two times a day (BID) | ORAL | 0 refills | Status: DC

## 2023-09-16 MED ORDER — DOXYCYCLINE MONOHYDRATE 100 MG PO TABS
100.0000 mg | ORAL_TABLET | Freq: Two times a day (BID) | ORAL | 0 refills | Status: AC
Start: 2023-09-16 — End: 2023-09-23

## 2023-09-16 MED ORDER — CEFDINIR 300 MG PO CAPS: 300.0000 mg | ORAL_CAPSULE | Freq: Two times a day (BID) | ORAL | 0 refills | Status: DC

## 2023-09-16 NOTE — ED Provider Notes (Signed)
 [facility] Provider Note    Event Date/Time   First MD Initiated Contact with Patient 09/16/23 1155     (approximate)   History   Leg Swelling   HPI  [name] [name] is a 89 y.o. male with a past medical history of type 2 diabetes, CVA, atrial fibrillation on Eliquis , thrombocytopenia, CAD status post CABG x 4, CKD, hyperlipidemia, hypertension who presents today for evaluation of left lower leg swelling and erythema.  Patient reports that he drove to and from [place]  this week, where he was for 3 days.  He came back last night and when he got home he noticed swelling and redness to his left leg.  He estimates the car ride to be 5.5 hours.  He denies chest pain or shortness of breath.  His wife notes that he had a fever last night before he left charge of 101.2 F.  Patient denies any chills or feeling of fever today.  He has not been anywhere where he thinks there could have been a tick exposure.  He denies cough, abdominal pain, nasal congestion, headache, neck pain.  He reports that he has pain in his low back across his low back which is not unusual for him.  He denies any burning with urination.  Patient Active Problem List   Diagnosis Date Noted   Chronic left shoulder pain 05/02/2023   Allergic rhinitis 05/18/2022   Anxiety 04/01/2022   Type 2 diabetes mellitus with complication, with long-term current use of insulin  (HCC) 04/01/2022   Gastroenteritis 03/23/2022   Rash 03/23/2022   Nausea and vomiting in adult 03/14/2022   Laceration of right hand without foreign body 02/07/2022   History of CVA (cerebrovascular accident) 01/26/2022   Atrial fibrillation, chronic (HCC) 01/26/2022   Atrial flutter (HCC) 11/05/2021   Chronic anticoagulation 11/05/2021   De Quervain's tenosynovitis, left 10/05/2020   Abnormal MRI, lumbar spine 08/12/2020   History of COVID-19 07/22/2020   Scrotal lesion 03/12/2020   Postherpetic neuralgia 05/09/2019   B12 deficiency  05/09/2019   Pulmonary hypertension, unspecified (HCC) 04/05/2019   Atypical chest pain 01/08/2019   Diabetic retinopathy (HCC) 09/20/2018   Chronic low back pain 03/05/2018   Breast tenderness in male 01/18/2018   PAC (premature atrial contraction) 01/18/2018   Neuropathic pain 01/18/2018   Bell's palsy 08/18/2017   H/O cold sores 08/18/2017   Insomnia 11/10/2016   PLMD (periodic limb movement disorder) 08/17/2016   Mild OSA 08/17/2016   Intolerance of continuous positive airway pressure (CPAP) ventilation 08/17/2016   Chronic arthritis 06/02/2016   CKD stage 3 due to type 2 diabetes mellitus (HCC) 06/02/2016   Diastolic dysfunction 05/04/2016   Chronic pain of both knees 10/06/2015   Spondylosis of lumbar region without myelopathy or radiculopathy 10/06/2015   S/P CABG x 4    Thrombocytopenia (HCC) 02/07/2015   DM2 (diabetes mellitus, type 2) (HCC) 02/06/2015   Chronic diastolic CHF (congestive heart failure) (HCC) 01/01/2014   CAD (coronary artery disease) 06/03/2013   Diabetic neuropathy (HCC) 11/12/2012   BPH (benign prostatic hyperplasia)    GERD (gastroesophageal reflux disease)    Hyperlipidemia 09/15/2009   Essential hypertension 09/15/2009          Physical Exam   Triage Vital Signs: ED Triage Vitals [09/16/23 1131]  Encounter Vitals Group     BP (!) 126/46     Girls Systolic BP Percentile      Girls Diastolic BP Percentile      Boys  Systolic BP Percentile      Boys Diastolic BP Percentile      Pulse Rate 63     Resp 20     Temp 98.1 F (36.7 C)     Temp Source Oral     SpO2 98 %     Weight 214 lb (97.1 kg)     Height 6' (1.829 m)     Head Circumference      Peak Flow      Pain Score 3     Pain Loc      Pain Education      Exclude from Growth Chart     Most recent vital signs: Vitals:   09/16/23 1509 09/16/23 1642  BP: (!) 135/56 (!) 146/68  Pulse: 68 91  Resp: 20   Temp: 98.2 F (36.8 C) 98 F (36.7 C)  SpO2: 98% 97%    Physical  Exam Vitals and nursing note reviewed.  Constitutional:      General: Awake and alert. No acute distress.    Appearance: Normal appearance. The patient is normal weight.  HENT:     Head: Normocephalic and atraumatic.     Mouth: Mucous membranes are moist.  Eyes:     General: PERRL. Normal EOMs        Right eye: No discharge.        Left eye: No discharge.     Conjunctiva/sclera: Conjunctivae normal.  Cardiovascular:     Rate and Rhythm: Normal rate and regular rhythm.     Pulses: Normal pulses.  Pulmonary:     Effort: Pulmonary effort is normal. No respiratory distress.     Breath sounds: Normal breath sounds.  Abdominal:     Abdomen is soft. There is no abdominal tenderness. No rebound or guarding. No distention. Musculoskeletal:        General: No swelling. Normal range of motion.     Cervical back: Normal range of motion and neck supple.  Left lower extremity swelling noted to his calf with erythema and petechiae noted to his left lower extremity.  Normal 2+ pedal pulses bilaterally.  No lymphangitis noted.  No crepitus.  Full and normal range of motion of toes, ankle, knee. Skin:    General: Skin is warm and dry.     Capillary Refill: Capillary refill takes less than 2 seconds.     Findings: No rash.  Neurological:     Mental Status: The patient is awake and alert.      ED Results / Procedures / Treatments   Labs (all labs ordered are listed, but only abnormal results are displayed) Labs Reviewed  URINALYSIS, ROUTINE W REFLEX MICROSCOPIC - Abnormal; Notable for the following components:      Result Value   Color, Urine YELLOW (*)    APPearance CLOUDY (*)    Glucose, UA >=500 (*)    Hgb urine dipstick LARGE (*)    Protein, ur 30 (*)    Bacteria, UA RARE (*)    All other components within normal limits  CBC WITH DIFFERENTIAL/PLATELET - Abnormal; Notable for the following components:   WBC 10.8 (*)    RBC 3.43 (*)    Hemoglobin 11.2 (*)    HCT 33.9 (*)     Platelets 66 (*)    Neutro Abs 8.5 (*)    All other components within normal limits  COMPREHENSIVE METABOLIC PANEL WITH GFR - Abnormal; Notable for the following components:   Glucose, Bld 167 (*)  BUN 38 (*)    Creatinine, Ser 1.67 (*)    Calcium  8.8 (*)    Total Protein 5.8 (*)    Albumin 3.4 (*)    GFR, Estimated 39 (*)    All other components within normal limits  RESP PANEL BY RT-PCR (RSV, FLU A&B, COVID)  RVPGX2  CULTURE, BLOOD (ROUTINE X 2)  CULTURE, BLOOD (ROUTINE X 2)  URINE CULTURE  LACTIC ACID, PLASMA  LACTIC ACID, PLASMA     EKG     RADIOLOGY I independently reviewed and interpreted imaging and agree with radiologists findings.     PROCEDURES:  Critical Care performed:   Procedures   MEDICATIONS ORDERED IN ED: Medications  cefTRIAXone  (ROCEPHIN ) 2 g in sodium chloride  0.9 % 100 mL IVPB (0 g Intravenous Stopped 09/16/23 1625)     IMPRESSION / MDM / ASSESSMENT AND PLAN / ED COURSE  I reviewed the triage vital signs and the nursing notes.   Differential diagnosis includes, but is not limited to, DVT, dependent edema, cellulitis, thrombocytopenia.  Patient is awake and alert, hemodynamically stable and afebrile.  He has not taken any antipyretics today.  Further workup is indicated.  Labs obtained including the lactate and blood cultures given his reported fever yesterday.  Labs revealed mild leukocytosis 10.9, though otherwise unremarkable/at his baseline.    His left lower extremity physical exam is consistent with developing cellulitis.  Ultrasound is negative for DVT.  No crepitus or bullae or hemodynamic instability or pain out of proportion to indicate deep space infection. No fluctuance to suggest cystic lesion such as abscess. Physical exam does not show any evidence of joint involvement. No lymphangitis.  His urinalysis is highly suggestive of urinary tract infection.  He has no CVA tenderness to suggest pyelonephritis.  No rectal/perineal pain  to suggest prostatitis.  I did obtain a CT renal stone to ensure that he did not have an infected stone, this was negative as well.  Chest x-ray does not reveal evidence of cardiopulmonary abnormality.  Given that patient has a cellulitis and a urinary tract infection.  I highly recommended that he be admitted to the hospital for continued management.  However, patient did not wish to be admitted.  I discussed that I am concerned about a systemic infection/bacteremia given that he had chills and fever yesterday.  Patient continues to insist that he would prefer to go home.  We did discuss extremely strict return precautions for if he develops fever or chills again.  He was started on antibiotics for his cellulitis and his urinary tract infection and he agrees to return promptly if he develops fever, chills, or any other new, worsening, or changing symptoms.  His wife at the bedside also agrees to bring him back immediately for any of these findings.  I did inform them that they are leaving the hospital would be AGAINST MEDICAL ADVICE today.  They expressed understanding, the patient still prefers to go home.  He was discharged in stable condition   Patient's presentation is most consistent with acute presentation with potential threat to life or bodily function.   Clinical Course as of 09/16/23 1654  Sat Sep 16, 2023  1620 I recommended admission to the hospitalist service, however patient declines admission [JP]    Clinical Course User Index [JP] Tekisha Darcey E, PA-C     FINAL CLINICAL IMPRESSION(S) / ED DIAGNOSES   Final diagnoses:  Acute cystitis with hematuria  Cellulitis of left lower extremity     Rx /  DC Orders   ED Discharge Orders          Ordered    cefdinir  (OMNICEF ) 300 MG capsule  2 times daily,   Status:  Discontinued        09/16/23 1633    doxycycline  (ADOXA) 100 MG tablet  2 times daily,   Status:  Discontinued        09/16/23 1633    cefdinir  (OMNICEF ) 300 MG  capsule  2 times daily        09/16/23 1639    doxycycline  (ADOXA) 100 MG tablet  2 times daily        09/16/23 1639             Note:  This document was prepared using Dragon voice recognition software and may include unintentional dictation errors.   Lazara Grieser E, PA-C 09/16/23 1654    Dicky Anes, MD 09/30/23 [phone]

## 2023-09-16 NOTE — ED Notes (Signed)
 Pt verbalizes understanding of discharge instructions.

## 2023-09-16 NOTE — ED Triage Notes (Signed)
 Pt to ED for petechiae to LLE and larger purpuric area to L anterior shin since yesterday. LLE slightly swollen compared to R. Has mild 'burning' pain to LLE. Sent for DVT rule out from [facility]. States also urine has been dark last few days. No dysuria.

## 2023-09-16 NOTE — ED Triage Notes (Signed)
 First Nurse Note:  Pt via POV from [facility]. Sent over for possible DVT. Pt report L calf pain. Pt was sitting for a long time yesterday. Pt is A&Ox4 and NAD. VSS

## 2023-09-16 NOTE — Discharge Instructions (Signed)
 It was highly advised that you stay in the hospital for treatment of your urinary tract infection and your cellulitis, though you prefer to try outpatient therapy.  It is very very very important that you return to the emergency department immediately if you develop shaking chills or fever, or any other new, worsening, or changing symptoms or other concerns.  Take the antibiotics as prescribed in the meantime.  It was a pleasure caring for you today.

## 2023-09-16 NOTE — ED Notes (Signed)
 Wrote to EDP re: ordering u/s for DVT rule out.

## 2023-09-18 LAB — URINE CULTURE: Culture: NO GROWTH

## 2023-09-21 ENCOUNTER — Inpatient Hospital Stay: Admitting: Nurse Practitioner

## 2023-09-21 LAB — CULTURE, BLOOD (ROUTINE X 2)
Culture: NO GROWTH
Culture: NO GROWTH
Special Requests: ADEQUATE

## 2023-09-22 ENCOUNTER — Telehealth: Payer: Self-pay | Admitting: *Deleted

## 2023-09-26 DIAGNOSIS — R600 Localized edema: Secondary | ICD-10-CM | POA: Diagnosis not present

## 2023-09-26 DIAGNOSIS — N1832 Chronic kidney disease, stage 3b: Secondary | ICD-10-CM | POA: Diagnosis not present

## 2023-09-26 DIAGNOSIS — I10 Essential (primary) hypertension: Secondary | ICD-10-CM | POA: Diagnosis not present

## 2023-09-26 DIAGNOSIS — R8290 Unspecified abnormal findings in urine: Secondary | ICD-10-CM | POA: Diagnosis not present

## 2023-09-26 DIAGNOSIS — E1122 Type 2 diabetes mellitus with diabetic chronic kidney disease: Secondary | ICD-10-CM | POA: Diagnosis not present

## 2023-09-26 DIAGNOSIS — N189 Chronic kidney disease, unspecified: Secondary | ICD-10-CM | POA: Diagnosis not present

## 2023-09-27 ENCOUNTER — Telehealth: Payer: Self-pay | Admitting: *Deleted

## 2023-09-28 ENCOUNTER — Other Ambulatory Visit: Payer: Self-pay | Admitting: Cardiovascular Disease

## 2023-09-29 LAB — HEMOGLOBIN A1C: Hemoglobin A1C: 6.9

## 2023-10-02 DIAGNOSIS — J323 Chronic sphenoidal sinusitis: Secondary | ICD-10-CM | POA: Diagnosis not present

## 2023-10-03 DIAGNOSIS — H04123 Dry eye syndrome of bilateral lacrimal glands: Secondary | ICD-10-CM | POA: Diagnosis not present

## 2023-10-04 DIAGNOSIS — Z794 Long term (current) use of insulin: Secondary | ICD-10-CM | POA: Diagnosis not present

## 2023-10-04 DIAGNOSIS — E1169 Type 2 diabetes mellitus with other specified complication: Secondary | ICD-10-CM | POA: Diagnosis not present

## 2023-10-04 DIAGNOSIS — E1159 Type 2 diabetes mellitus with other circulatory complications: Secondary | ICD-10-CM | POA: Diagnosis not present

## 2023-10-04 DIAGNOSIS — E785 Hyperlipidemia, unspecified: Secondary | ICD-10-CM | POA: Diagnosis not present

## 2023-10-04 DIAGNOSIS — E1122 Type 2 diabetes mellitus with diabetic chronic kidney disease: Secondary | ICD-10-CM | POA: Diagnosis not present

## 2023-10-04 DIAGNOSIS — N1831 Chronic kidney disease, stage 3a: Secondary | ICD-10-CM | POA: Diagnosis not present

## 2023-10-12 DIAGNOSIS — E1165 Type 2 diabetes mellitus with hyperglycemia: Secondary | ICD-10-CM | POA: Diagnosis not present

## 2023-10-18 ENCOUNTER — Other Ambulatory Visit: Payer: Self-pay | Admitting: Internal Medicine

## 2023-10-23 DIAGNOSIS — J342 Deviated nasal septum: Secondary | ICD-10-CM | POA: Diagnosis not present

## 2023-10-23 DIAGNOSIS — J323 Chronic sphenoidal sinusitis: Secondary | ICD-10-CM | POA: Diagnosis not present

## 2023-10-23 DIAGNOSIS — J328 Other chronic sinusitis: Secondary | ICD-10-CM | POA: Diagnosis not present

## 2023-10-25 ENCOUNTER — Ambulatory Visit: Admitting: Internal Medicine

## 2023-11-02 DIAGNOSIS — H903 Sensorineural hearing loss, bilateral: Secondary | ICD-10-CM | POA: Diagnosis not present

## 2023-11-02 DIAGNOSIS — H6123 Impacted cerumen, bilateral: Secondary | ICD-10-CM | POA: Diagnosis not present

## 2023-11-12 DIAGNOSIS — E1165 Type 2 diabetes mellitus with hyperglycemia: Secondary | ICD-10-CM | POA: Diagnosis not present

## 2023-11-17 ENCOUNTER — Ambulatory Visit (INDEPENDENT_AMBULATORY_CARE_PROVIDER_SITE_OTHER): Admitting: Nurse Practitioner

## 2023-11-17 ENCOUNTER — Ambulatory Visit: Payer: Self-pay

## 2023-11-17 VITALS — BP 118/60 | HR 67 | Temp 97.6°F | Wt 213.5 lb

## 2023-11-17 DIAGNOSIS — I951 Orthostatic hypotension: Secondary | ICD-10-CM

## 2023-11-17 DIAGNOSIS — R42 Dizziness and giddiness: Secondary | ICD-10-CM

## 2023-11-17 DIAGNOSIS — L0390 Cellulitis, unspecified: Secondary | ICD-10-CM

## 2023-11-17 DIAGNOSIS — I5032 Chronic diastolic (congestive) heart failure: Secondary | ICD-10-CM

## 2023-11-17 MED ORDER — DOXYCYCLINE HYCLATE 100 MG PO TABS: 100.0000 mg | ORAL_TABLET | Freq: Two times a day (BID) | ORAL | 0 refills | Status: DC

## 2023-11-17 MED ORDER — PREDNISONE 20 MG PO TABS: 20.0000 mg | ORAL_TABLET | Freq: Every day | ORAL | 0 refills | Status: DC

## 2023-11-17 MED ORDER — MECLIZINE HCL 12.5 MG PO TABS: 12.5000 mg | ORAL_TABLET | Freq: Three times a day (TID) | ORAL | 0 refills | Status: AC | PRN

## 2023-11-17 NOTE — Assessment & Plan Note (Signed)
 Orthostatic hypotension Significant blood pressure drop upon position change, contributing to dizziness. - Advise slow position changes. - Recommend compression stockings once leg condition improves.

## 2023-11-17 NOTE — Progress Notes (Signed)
 Established Patient Office Visit  Subjective   Patient ID: [name] [name], male    DOB: 01-09-1934  Age: 89 y.o. MRN: [id number]  Chief Complaint  Patient presents with   Dizziness    Discussed the use of AI scribe software for clinical note transcription with the patient, who gave verbal consent to proceed.  History of Present Illness [name] [name] is an 89 year old male with diabetes and chronic sinusitis who presents with leg swelling and vertigo.  Lower extremity edema and recurrent blistering - Recurring blister on the lateral aspect of the left leg, occurring two to three times each summer - Blister starts small, enlarges, ruptures, and forms a sore - Concern for infection risk due to diabetes - No recent leg injury - No fever  Vertigo and dizziness - Vertigo onset Monday evening with persistent room-spinning sensation - Symptoms worsen with standing or positional changes - Meclizine  provides partial relief - History of vertigo intermittently feels current episode is consistent with past episodes - No vomiting, but sensation is nearly nauseating -Has diploplia at baseline, is better than is has been -no weakness/sensory changes  Orthostatic hypotension - Significant blood pressure drops with positional changes, contributing to dizziness - Balances fluid intake due to history of congestive heart failure to prevent fluid overload. Recent 5lb increase of weight over the last week        ROS: see HPI    Objective:     BP 118/60   Pulse 67   Temp 97.6 F (36.4 C) (Temporal)   Wt 213 lb 8 oz (96.8 kg)   SpO2 97%   BMI 28.96 kg/m  BP Readings from Last 3 Encounters:  11/17/23 118/60  09/16/23 (!) 146/68  07/25/23 124/78   Wt Readings from Last 3 Encounters:  11/17/23 213 lb 8 oz (96.8 kg)  09/16/23 214 lb (97.1 kg)  07/25/23 203 lb (92.1 kg)    Orthostatic VS: Supine: 1 40/66, 63 Sitting: 1 20/68, 66 Standing: 1 18/60, 67  Physical  Exam Vitals reviewed.  Constitutional:      Appearance: Normal appearance.  HENT:     Head: Normocephalic and atraumatic.  Cardiovascular:     Rate and Rhythm: Normal rate and regular rhythm.  Pulmonary:     Effort: Pulmonary effort is normal.     Breath sounds: Normal breath sounds.  Musculoskeletal:     Cervical back: Neck supple.  Skin:    General: Skin is warm and dry.      Neurological:     Mental Status: He is alert and oriented to person, place, and time.  Psychiatric:        Mood and Affect: Mood normal.        Behavior: Behavior normal.        Thought Content: Thought content normal.        Judgment: Judgment normal.   EKG: Sinus rhythm with first-degree AV block, right bundle branch block   No results found for any visits on 11/17/23.    The ASCVD Risk score (Arnett DK, et al., 2019) failed to calculate for the following reasons:   The 2019 ASCVD risk score is only valid for ages 60 to 31   Risk score cannot be calculated because patient has a medical history suggesting prior/existing ASCVD    Assessment & Plan:   Problem List Items Addressed This Visit       Cardiovascular and Mediastinum   Chronic diastolic CHF (congestive heart failure) (HCC)  Relevant Orders   EKG 12-Lead     Other   Vertigo   Relevant Medications   meclizine  (ANTIVERT ) 12.5 MG tablet   predniSONE  (DELTASONE ) 20 MG tablet   Cellulitis - Primary   Relevant Medications   doxycycline  (VIBRA -TABS) 100 MG tablet   Assessment and Plan Assessment & Plan Vertigo Intermittent vertigo with nausea, no vomiting, orthostatic hypotension may contribute. Meclizine  provides relief. Prednisone  considered for sinus pressure, risks include bleeding with blood thinner. - Prescribe prednisone  20 mg daily for 5 days, take with food, separate from blood thinner by 2-4 hours. - Prescribe meclizine  12.5-25 mg every 8 hours as needed. - Provide handout on Epley maneuver. - offered referral to  vestibular rehabilitation, but declines  Cellulitis of left lower leg Chronic blistering and sore with swelling and redness, no fever. Infection risk due to diabetes.  - Prescribe doxycycline , one capsule morning and evening for 10 days.  Orthostatic hypotension Significant blood pressure drop upon position change, contributing to dizziness. - Advise slow position changes. - Recommend compression stockings once leg condition improves.  Congestive heart failure Recent 5-pound weight gain, possible fluid retention. Orthostatic hypotension and dehydration considered. - Increase torsemide  to twice daily for two days. - Monitor weight daily.  I consulted with my supervising physician regarding the above plan, she was agreeable.  I personally spent a total of 60 minutes in the care of the patient today including preparing to see the patient, getting/reviewing separately obtained history, performing a medically appropriate exam/evaluation, counseling and educating, placing orders, referring and communicating with other health care professionals, documenting clinical information in the EHR, and communicating results.   Return if symptoms worsen or fail to improve.    [name] FORBES Pereyra, NP

## 2023-11-17 NOTE — Assessment & Plan Note (Signed)
 Vertigo Intermittent vertigo with nausea, no vomiting, orthostatic hypotension may contribute. Meclizine  provides relief. Prednisone  considered for sinus pressure, risks include bleeding with blood thinner. - Prescribe prednisone  20 mg daily for 5 days, take with food, separate from blood thinner by 2-4 hours. - Prescribe meclizine  12.5-25 mg every 8 hours as needed. - Provide handout on Epley maneuver. - offered referral to vestibular rehabilitation, but declines

## 2023-11-17 NOTE — Assessment & Plan Note (Signed)
 Congestive heart failure Recent 5-pound weight gain, possible fluid retention. Orthostatic hypotension and dehydration considered. - Increase torsemide  to twice daily for two days. - Monitor weight daily.

## 2023-11-17 NOTE — Telephone Encounter (Signed)
 FYI Only or Action Required?: FYI only for provider.  Patient was last seen in primary care on 05/01/2023 by Marylynn Verneita CROME, MD.  Called Nurse Triage reporting Leg Pain.  Symptoms began several days ago.  Interventions attempted: Nothing.  Symptoms are: unchanged.  Triage Disposition: See Physician Within 24 Hours  Patient/caregiver understands and will follow disposition?: Yes       Copied from CRM #[id number]. Topic: Clinical - Red Word Triage >> Nov 17, 2023  8:36 AM Robinson H wrote: [facility] that prompted transfer to Nurse Triage: Experiencing vertigo since Monday and a sore place on left leg, red and swollen and throbbing Reason for Disposition  Looks like a boil, infected sore, deep ulcer or other infected rash (spreading redness, pus)  Answer Assessment - Initial Assessment Questions 1. ONSET: When did the pain start?      Couple of days 2. LOCATION: Where is the pain located?      Left leg blister, pops and drains 3. PAIN: How bad is the pain?    (Scale 1-10; or mild, moderate, severe)     moderate 4. WORK OR EXERCISE: Has there been any recent work or exercise that involved this part of the body?      Denies  5. CAUSE: What do you think is causing the leg pain?     blister 6. OTHER SYMPTOMS: Do you have any other symptoms? (e.g., chest pain, back pain, breathing difficulty, swelling, rash, fever, numbness, weakness)     Denies.  7. PREGNANCY: Is there any chance you are pregnant? When was your last menstrual period?     na  Protocols used: Leg Pain-A-AH

## 2023-11-17 NOTE — Assessment & Plan Note (Signed)
 Cellulitis of left lower leg Chronic blistering and sore with swelling and redness, no fever. Infection risk due to diabetes.  - Prescribe doxycycline , one capsule morning and evening for 10 days.

## 2023-12-07 DIAGNOSIS — I480 Paroxysmal atrial fibrillation: Secondary | ICD-10-CM | POA: Diagnosis not present

## 2023-12-07 DIAGNOSIS — N183 Chronic kidney disease, stage 3 unspecified: Secondary | ICD-10-CM | POA: Diagnosis not present

## 2023-12-07 DIAGNOSIS — I5032 Chronic diastolic (congestive) heart failure: Secondary | ICD-10-CM | POA: Diagnosis not present

## 2023-12-07 DIAGNOSIS — I130 Hypertensive heart and chronic kidney disease with heart failure and stage 1 through stage 4 chronic kidney disease, or unspecified chronic kidney disease: Secondary | ICD-10-CM | POA: Diagnosis not present

## 2023-12-07 DIAGNOSIS — Z87891 Personal history of nicotine dependence: Secondary | ICD-10-CM | POA: Diagnosis not present

## 2023-12-07 DIAGNOSIS — E785 Hyperlipidemia, unspecified: Secondary | ICD-10-CM | POA: Diagnosis not present

## 2023-12-07 DIAGNOSIS — I5030 Unspecified diastolic (congestive) heart failure: Secondary | ICD-10-CM | POA: Diagnosis not present

## 2023-12-07 DIAGNOSIS — I2510 Atherosclerotic heart disease of native coronary artery without angina pectoris: Secondary | ICD-10-CM | POA: Diagnosis not present

## 2023-12-07 DIAGNOSIS — I4892 Unspecified atrial flutter: Secondary | ICD-10-CM | POA: Diagnosis not present

## 2023-12-11 ENCOUNTER — Encounter: Payer: Self-pay | Admitting: *Deleted

## 2023-12-11 ENCOUNTER — Other Ambulatory Visit: Payer: Self-pay | Admitting: *Deleted

## 2023-12-11 NOTE — Patient Instructions (Addendum)
 [name] [name] - I am sorry I was unable to reach you today for our scheduled appointment. I work with Marylynn Verneita CROME, MD and am calling to support your healthcare needs. I have rescheduled you for a telephone call on 12/13/23 at 2:00. Please give me a call if that date or time doesn't work well for you. I look forward to speaking with you soon.   Thank you,   Josette Pellet, RN, BSN [place]  [facility] RN Care Manager Direct Dial : [phone]  Fax: [phone]

## 2023-12-12 ENCOUNTER — Ambulatory Visit: Admitting: Internal Medicine

## 2023-12-12 ENCOUNTER — Encounter: Payer: Self-pay | Admitting: Internal Medicine

## 2023-12-12 VITALS — BP 138/56 | HR 97 | Ht 72.0 in | Wt 209.6 lb

## 2023-12-12 DIAGNOSIS — I872 Venous insufficiency (chronic) (peripheral): Secondary | ICD-10-CM

## 2023-12-12 DIAGNOSIS — N1832 Chronic kidney disease, stage 3b: Secondary | ICD-10-CM

## 2023-12-12 DIAGNOSIS — R161 Splenomegaly, not elsewhere classified: Secondary | ICD-10-CM

## 2023-12-12 DIAGNOSIS — Z23 Encounter for immunization: Secondary | ICD-10-CM | POA: Diagnosis not present

## 2023-12-12 DIAGNOSIS — I482 Chronic atrial fibrillation, unspecified: Secondary | ICD-10-CM | POA: Diagnosis not present

## 2023-12-12 DIAGNOSIS — D696 Thrombocytopenia, unspecified: Secondary | ICD-10-CM | POA: Diagnosis not present

## 2023-12-12 DIAGNOSIS — I130 Hypertensive heart and chronic kidney disease with heart failure and stage 1 through stage 4 chronic kidney disease, or unspecified chronic kidney disease: Secondary | ICD-10-CM

## 2023-12-12 DIAGNOSIS — I5032 Chronic diastolic (congestive) heart failure: Secondary | ICD-10-CM | POA: Diagnosis not present

## 2023-12-12 DIAGNOSIS — I272 Pulmonary hypertension, unspecified: Secondary | ICD-10-CM

## 2023-12-12 DIAGNOSIS — R10817 Generalized abdominal tenderness: Secondary | ICD-10-CM | POA: Diagnosis not present

## 2023-12-12 DIAGNOSIS — H353232 Exudative age-related macular degeneration, bilateral, with inactive choroidal neovascularization: Secondary | ICD-10-CM

## 2023-12-12 DIAGNOSIS — E1165 Type 2 diabetes mellitus with hyperglycemia: Secondary | ICD-10-CM | POA: Diagnosis not present

## 2023-12-12 DIAGNOSIS — R140 Abdominal distension (gaseous): Secondary | ICD-10-CM

## 2023-12-12 DIAGNOSIS — I5023 Acute on chronic systolic (congestive) heart failure: Secondary | ICD-10-CM | POA: Diagnosis not present

## 2023-12-12 DIAGNOSIS — N183 Chronic kidney disease, stage 3 unspecified: Secondary | ICD-10-CM

## 2023-12-12 DIAGNOSIS — R0602 Shortness of breath: Secondary | ICD-10-CM | POA: Diagnosis not present

## 2023-12-12 DIAGNOSIS — J323 Chronic sphenoidal sinusitis: Secondary | ICD-10-CM | POA: Insufficient documentation

## 2023-12-12 DIAGNOSIS — E118 Type 2 diabetes mellitus with unspecified complications: Secondary | ICD-10-CM | POA: Diagnosis not present

## 2023-12-12 LAB — COMPREHENSIVE METABOLIC PANEL WITH GFR
ALT: 11 U/L (ref 0–53)
AST: 17 U/L (ref 0–37)
Albumin: 4.3 g/dL (ref 3.5–5.2)
Alkaline Phosphatase: 96 U/L (ref 39–117)
BUN: 45 mg/dL — ABNORMAL HIGH (ref 6–23)
CO2: 24 meq/L (ref 19–32)
Calcium: 9.2 mg/dL (ref 8.4–10.5)
Chloride: 105 meq/L (ref 96–112)
Creatinine, Ser: 1.9 mg/dL — ABNORMAL HIGH (ref 0.40–1.50)
GFR: 31.19 mL/min — ABNORMAL LOW (ref >60.00)
Glucose, Bld: 212 mg/dL — ABNORMAL HIGH (ref 70–99)
Potassium: 4.7 meq/L (ref 3.5–5.1)
Sodium: 137 meq/L (ref 135–145)
Total Bilirubin: 0.6 mg/dL (ref 0.2–1.2)
Total Protein: 6.5 g/dL (ref 6.0–8.3)

## 2023-12-12 LAB — BRAIN NATRIURETIC PEPTIDE: Pro B Natriuretic peptide (BNP): 96 pg/mL (ref 0.0–100.0)

## 2023-12-12 NOTE — Assessment & Plan Note (Signed)
 Managed by [facility] with [place],  Farxiga  and fast acting insulin  with lunch and dinner.  Patient self adjusts insulin  to prevent hypoglycemia.  His nephropathy and CKD is managed by [facility].    Lab Results  Component Value Date   HGBA1C 6.9 09/29/2023

## 2023-12-12 NOTE — Assessment & Plan Note (Signed)
 Reviewed ENT note from Adine Row.  Infectioh has not resolved with PO abx  surgery is planned pending cardiology clearance

## 2023-12-12 NOTE — Progress Notes (Unsigned)
 Subjective:  Patient ID: [name] [name], male    DOB: [birth year]  Age: 89 y.o. MRN: [id number]  CC: There were no encounter diagnoses.   HPI [name] [name] presents for  Chief Complaint  Patient presents with   Medical Management of Chronic Issues    6 month follow up     1) he was treated for LE cellulitis in September with doxycycline   23) tYPE 2 DM; managed by [facility]  4) gynecomastia left breast:  reviewed prior mammogram   5) OA involving fingers/hands: gest injections taking tylenol  1500 mg   6) recent onset of diplopia I April . Workup included CT scan of brain. Etiology  : sphenoid sinus infection,  has deferred the recommended sugery because the diplopia ,resolved in mid June after 2 rounds of antibiotics.  Has  been taking probiotics   7) abdomen has become enlarged (per wife)  and something in there is jumping around  for the last 2 weeks . He feels that the left side of his belly is sore to palpation.  Moves bowels daily ,  does not feel constipated or nauseated.  Gained   4 lbs over a period of two weeks  saw cardiology recently ,  advised to take  torsemide  to 40 mg daily by cardiology  has lost 4 lbs since increasing  the torsemide   4 days ago. Urine is occasionally tea colored  ., supplementing potassium with each dose,  had 2 leg cramps recently     8)    Outpatient Medications Prior to Visit  Medication Sig Dispense Refill   acetaminophen  (TYLENOL ) 325 MG tablet Take by mouth.     azelastine  (ASTELIN ) 0.1 % nasal spray Place 2 sprays into both nostrils 2 (two) times daily. Use in each nostril as directed 30 mL 2   BD PEN NEEDLE NANO U/F 32G X 4 MM MISC USE AS DIRECTED 100 each 0   carvedilol  (COREG ) 12.5 MG tablet TAKE ONE (1) TABLET BY MOUTH TWO TIMES PER DAY (Patient taking differently: Take 12.5 mg by mouth 2 (two) times daily with a meal.) 60 tablet 0   Cholecalciferol  (VITAMIN D3) 50 MCG (2000 UT) TABS Take 2,000 Units by mouth in the  morning.     clotrimazole  (LOTRIMIN ) 1 % cream Apply 1 Application topically 2 (two) times daily.     dapagliflozin  propanediol (FARXIGA ) 10 MG TABS tablet Take by mouth.     doxazosin  (CARDURA ) 2 MG tablet Take 1 tablet (2 mg total) by mouth 2 (two) times daily. Please keep upcoming appt for future refills 180 tablet 0   doxycycline  (VIBRA -TABS) 100 MG tablet Take 1 tablet (100 mg total) by mouth 2 (two) times daily. 20 tablet 0   ELIQUIS  2.5 MG TABS tablet Take 2.5 mg by mouth 2 (two) times daily.     ezetimibe  (ZETIA ) 10 MG tablet TAKE 1 TABLET BY MOUTH DAILY 90 tablet 0   finasteride  (PROSCAR ) 5 MG tablet Take 1 tablet (5 mg total) by mouth daily. 90 tablet 1   furosemide  (LASIX ) 20 MG tablet Take 20 mg by mouth. Patient states he takes as needed depending on weight gain as recommended by provider     gabapentin  (NEURONTIN ) 100 MG capsule TAKE 1 CAPSULE BY MOUTH TWO TIMES DAILY 60 capsule 2   HUMALOG KWIKPEN 100 UNIT/ML KwikPen Inject 6 Units into the skin 3 (three) times daily with meals.     hydrALAZINE  (APRESOLINE ) 25 MG tablet Take 1 tablet (  25 mg total) by mouth 3 (three) times daily. 270 tablet 3   insulin  degludec (TRESIBA FLEXTOUCH) 100 UNIT/ML FlexTouch Pen Inject into the skin daily. Patient takes 28-30 units 1 x per day. 115-125 takes 28 units, > 125 can take 29-30 units     isosorbide  mononitrate (IMDUR ) 30 MG 24 hr tablet Take 1 tablet (30 mg total) by mouth daily. Please call [phone] for a sooner appointment and for further refills. 30 tablet 0   loperamide  (IMODIUM  A-D) 2 MG tablet Take 1 tablet (2 mg total) by mouth 3 (three) times daily as needed for diarrhea or loose stools. 30 tablet 0   meclizine  (ANTIVERT ) 12.5 MG tablet Take 1 tablet (12.5 mg total) by mouth 3 (three) times daily as needed for dizziness. 30 tablet 0   Multiple Vitamins-Minerals (PRESERVISION AREDS 2 PO) Take 1 tablet by mouth in the morning and at bedtime.     omeprazole  (PRILOSEC) 20 MG capsule TAKE 1  CAPSULE (20 MG) BY MOUTH TWICE DAILY 180 capsule 1   ondansetron  (ZOFRAN -ODT) 4 MG disintegrating tablet Take 1 tablet (4 mg total) by mouth every 8 (eight) hours as needed for nausea or vomiting. 20 tablet 0   potassium chloride  (KLOR-CON ) 10 MEQ tablet Take 1 tablet (10 mEq total) by mouth daily as needed. Take with torsemide  90 tablet 3   predniSONE  (DELTASONE ) 20 MG tablet Take 1 tablet (20 mg total) by mouth daily with breakfast. 5 tablet 0   rosuvastatin  (CRESTOR ) 20 MG tablet Take 20 mg by mouth daily.     torsemide  (DEMADEX ) 20 MG tablet FOR WEIGHT 210-215 TAKE 1 TABLET WITH POTASSIUM  FOR WEIGHT GREATER THAN 215 TAKE 1 TABLET TWICE A DAY (8AM & 2PM) WITH 2 POTASSIUM 200 tablet 1   No facility-administered medications prior to visit.    Review of Systems;  Patient denies headache, fevers, malaise, unintentional weight loss, skin rash, eye pain, sinus congestion and sinus pain, sore throat, dysphagia,  hemoptysis , cough, dyspnea, wheezing, chest pain, palpitations, orthopnea, edema, abdominal pain, nausea, melena, diarrhea, constipation, flank pain, dysuria, hematuria, urinary  Frequency, nocturia, numbness, tingling, seizures,  Focal weakness, Loss of consciousness,  Tremor, insomnia, depression, anxiety, and suicidal ideation.      Objective:  There were no vitals taken for this visit.  BP Readings from Last 3 Encounters:  11/17/23 118/60  09/16/23 (!) 146/68  07/25/23 124/78    Wt Readings from Last 3 Encounters:  11/17/23 213 lb 8 oz (96.8 kg)  09/16/23 214 lb (97.1 kg)  07/25/23 203 lb (92.1 kg)    Physical Exam  Lab Results  Component Value Date   HGBA1C 6.9 09/29/2023   HGBA1C 6.9 02/21/2023   HGBA1C 6.6 (H) 04/11/2022    Lab Results  Component Value Date   CREATININE 1.67 (H) 09/16/2023   CREATININE 2.26 (H) 11/16/2022   CREATININE 2.01 (H) 11/08/2022    Lab Results  Component Value Date   WBC 10.8 (H) 09/16/2023   HGB 11.2 (L) 09/16/2023   HCT 33.9  (L) 09/16/2023   PLT 66 (L) 09/16/2023   GLUCOSE 167 (H) 09/16/2023   CHOL 73 02/15/2023   TRIG 138.0 02/15/2023   HDL 33.80 (L) 02/15/2023   LDLDIRECT 65.0 06/02/2016   LDLCALC 12 02/15/2023   ALT 16 09/16/2023   AST 22 09/16/2023   NA 138 09/16/2023   K 4.7 09/16/2023   CL 110 09/16/2023   CREATININE 1.67 (H) 09/16/2023   BUN 38 (H) 09/16/2023  CO2 22 09/16/2023   TSH 3.245 01/26/2022   PSA 1.60 12/27/2017   INR 1.1 11/08/2022   HGBA1C 6.9 09/29/2023    CT Renal Stone Study Result Date: 09/16/2023 CLINICAL DATA:  Abdominal/flank pain, stone suspected. Leg swelling. EXAM: CT ABDOMEN AND PELVIS WITHOUT CONTRAST TECHNIQUE: Multidetector CT imaging of the abdomen and pelvis was performed following the standard protocol without IV contrast. RADIATION DOSE REDUCTION: This exam was performed according to the departmental dose-optimization program which includes automated exposure control, adjustment of the mA and/or kV according to patient size and/or use of iterative reconstruction technique. COMPARISON:  03/13/2022. FINDINGS: Lower chest: There is a trace pericardial effusion. Multi-vessel coronary artery calcifications are noted. Minimal atelectasis or scarring is present at the lung bases. Hepatobiliary: No focal liver abnormality is seen. Status post cholecystectomy. No biliary dilatation. Pancreas: Unremarkable. No pancreatic ductal dilatation or surrounding inflammatory changes. Spleen: The spleen is mildly enlarged in AP diameter at 16.9 cm. Adrenals/Urinary Tract: The adrenal glands are within normal limits. Stable hypodensities are noted in the left kidney, previously characterized as cysts. No renal or ureteral calculus or obstructive uropathy bilaterally. Stable perinephric fat stranding is present bilaterally. Significant perinephric retroperitoneal fat is noted which displaces the bowel anteriorly. The visualized bladder is within normal limits. Stomach/Bowel: Stomach is within  normal limits. Appendix appears normal. No evidence of bowel wall thickening, distention, or inflammatory changes. No free air or pneumatosis is seen. There is scattered diverticula along the colon without evidence of diverticulitis. Vascular/Lymphatic: Aortic atherosclerosis. No enlarged abdominal or pelvic lymph nodes. Reproductive: The prostate gland is enlarged. Other: No abdominopelvic ascites. Musculoskeletal: Total hip arthroplasty changes are noted on the right. No acute osseous abnormality is seen. IMPRESSION: 1. No renal calculus or hydronephrosis. 2. Mild splenomegaly. 3. Diverticulosis without diverticulitis. 4. Enlarged prostate gland. 5. Aortic atherosclerosis and coronary artery calcifications. Electronically Signed   By: Leita Birmingham M.D.   On: 09/16/2023 15:06   DG Chest 2 View Result Date: 09/16/2023 CLINICAL DATA:  Leg pain and swelling with erythema.  Possible DVT. EXAM: CHEST - 2 VIEW COMPARISON:  03/22/2022. FINDINGS: The heart mildly enlarged and mediastinal contours are within normal limits. There is atherosclerotic calcification of the aorta. No consolidation, effusion, or pneumothorax is seen. Sternotomy wires are noted over the midline. Degenerative changes are present in the thoracic spine. No acute osseous abnormality. IMPRESSION: No active cardiopulmonary disease. Electronically Signed   By: Leita Birmingham M.D.   On: 09/16/2023 13:37   US  Venous Img Lower  Left (DVT Study) Result Date: 09/16/2023 CLINICAL DATA:  Left leg pain and swelling EXAM: LEFT LOWER EXTREMITY VENOUS DOPPLER ULTRASOUND TECHNIQUE: Gray-scale sonography with compression, as well as color and duplex ultrasound, were performed to evaluate the deep venous system(s) from the level of the common femoral vein through the popliteal and proximal calf veins. COMPARISON:  None Available. FINDINGS: VENOUS Normal compressibility of the common femoral, superficial femoral, and popliteal veins, as well as the visualized calf  veins. Visualized portions of profunda femoral vein and great saphenous vein unremarkable. No filling defects to suggest DVT on grayscale or color Doppler imaging. Doppler waveforms show normal direction of venous flow, normal respiratory plasticity and response to augmentation. Limited views of the contralateral common femoral vein are unremarkable. OTHER None. Limitations: none IMPRESSION: 1. No evidence of left lower extremity DVT. Electronically Signed   By: Maude Naegeli M.D.   On: 09/16/2023 13:27    Assessment & Plan:  .There are no diagnoses linked  to this encounter.   I spent 34 minutes on the day of this face to face encounter reviewing patient's  most recent visit with cardiology,  nephrology,  and neurology,  prior relevant surgical and non surgical procedures, recent  labs and imaging studies, counseling on weight management,  reviewing the assessment and plan with patient, and post visit ordering and reviewing of  diagnostics and therapeutics with patient  .   Follow-up: No follow-ups on file.   Verneita LITTIE Kettering, MD

## 2023-12-12 NOTE — Assessment & Plan Note (Signed)
 His cardioloigst has added daily torsemide 

## 2023-12-12 NOTE — Patient Instructions (Addendum)
  You can take 3000 mg of acetominophen (tylenol ) every day safely  In divided doses (750 mg  every 6 hours  Or 1000 mg every 8 hours.)   I RECOMMEND USING COMPRESSION KNEE HIGHS TO PREVENT STRETCHING OF SKIN BY FLUID WHICH RESULTS IN ULCERS.  THIS IS THE MAINSTAY OF TREATMENT FOR ALL PATIENTS WITH VENOUS INSUFFICIENCY   CONTINUE 40 MG TORSEMIDE  FOR  ANOTHER 48 HOURS  ABDOMINAL ULTRASOUND ORDERED

## 2023-12-13 ENCOUNTER — Other Ambulatory Visit: Payer: Self-pay | Admitting: *Deleted

## 2023-12-13 ENCOUNTER — Encounter: Payer: Self-pay | Admitting: *Deleted

## 2023-12-13 ENCOUNTER — Encounter: Payer: Self-pay | Admitting: Internal Medicine

## 2023-12-13 ENCOUNTER — Ambulatory Visit: Payer: Self-pay | Admitting: Internal Medicine

## 2023-12-13 DIAGNOSIS — N1832 Chronic kidney disease, stage 3b: Secondary | ICD-10-CM | POA: Insufficient documentation

## 2023-12-13 DIAGNOSIS — R161 Splenomegaly, not elsewhere classified: Secondary | ICD-10-CM

## 2023-12-13 DIAGNOSIS — H353232 Exudative age-related macular degeneration, bilateral, with inactive choroidal neovascularization: Secondary | ICD-10-CM | POA: Insufficient documentation

## 2023-12-13 DIAGNOSIS — R932 Abnormal findings on diagnostic imaging of liver and biliary tract: Secondary | ICD-10-CM

## 2023-12-13 DIAGNOSIS — N183 Chronic kidney disease, stage 3 unspecified: Secondary | ICD-10-CM | POA: Insufficient documentation

## 2023-12-13 DIAGNOSIS — K7460 Unspecified cirrhosis of liver: Secondary | ICD-10-CM

## 2023-12-13 DIAGNOSIS — I872 Venous insufficiency (chronic) (peripheral): Secondary | ICD-10-CM | POA: Insufficient documentation

## 2023-12-13 MED ORDER — TRAZODONE HCL 50 MG PO TABS: 25.0000 mg | ORAL_TABLET | Freq: Every evening | ORAL | 0 refills | Status: DC | PRN

## 2023-12-13 NOTE — Assessment & Plan Note (Signed)
 Patient has been prescribed amiodarone  at least twice per review of medical chart and has not been taking it.he is taking eliquis  and appears rate controlled currently .  His condition is likely aggravated by untreated OSA but he declines testing

## 2023-12-13 NOTE — Assessment & Plan Note (Signed)
Continue follow up with ophthalmology

## 2023-12-13 NOTE — Assessment & Plan Note (Addendum)
 Review of outside labs done July22 2025 note platelet count of 101K.    Lab Results  Component Value Date   WBC 10.8 (H) 09/16/2023   HGB 11.2 (L) 09/16/2023   HCT 33.9 (L) 09/16/2023   MCV 98.8 09/16/2023   PLT 66 (L) 09/16/2023

## 2023-12-13 NOTE — Assessment & Plan Note (Signed)
 Encouraged to consider wearing compression socks to prevent swelling and improve skin integrity which may reduce recurrent ulcerations

## 2023-12-13 NOTE — Assessment & Plan Note (Signed)
 Secondary to untreated OSA due to CPAP intolerance.

## 2023-12-13 NOTE — Assessment & Plan Note (Addendum)
 His weight gain likely began when Farxiga  was stopped.  He is currently taking 40 mg torsemide  and continues to have orthopnea and abdominal distension.  Repeat labs today and abd ultrasound ordered to eval for ascites.  Review of the last year of labs suggests that his baseline GFR is 32 to 39 ml/min .  The recent drop is likely accentuated by volume overload.  His BNP is normal.  Will reduce torsemide  to 20 mg once daily as planned by cardiology   Lab Results  Component Value Date   CREATININE 1.90 (H) 12/12/2023   Lab Results  Component Value Date   NA 137 12/12/2023   K 4.7 12/12/2023   CL 105 12/12/2023   CO2 24 12/12/2023

## 2023-12-19 ENCOUNTER — Ambulatory Visit
Admission: RE | Admit: 2023-12-19 | Discharge: 2023-12-19 | Disposition: A | Discharge status: Home / Self Care | Source: Ambulatory Visit | Attending: Internal Medicine | Admitting: Internal Medicine

## 2023-12-19 DIAGNOSIS — R0602 Shortness of breath: Secondary | ICD-10-CM | POA: Diagnosis not present

## 2023-12-19 DIAGNOSIS — R10817 Generalized abdominal tenderness: Secondary | ICD-10-CM | POA: Diagnosis not present

## 2023-12-19 DIAGNOSIS — R10819 Abdominal tenderness, unspecified site: Secondary | ICD-10-CM | POA: Diagnosis not present

## 2023-12-19 DIAGNOSIS — R140 Abdominal distension (gaseous): Secondary | ICD-10-CM | POA: Insufficient documentation

## 2023-12-19 DIAGNOSIS — Z9049 Acquired absence of other specified parts of digestive tract: Secondary | ICD-10-CM | POA: Diagnosis not present

## 2023-12-19 DIAGNOSIS — R161 Splenomegaly, not elsewhere classified: Secondary | ICD-10-CM | POA: Diagnosis not present

## 2023-12-24 DIAGNOSIS — R161 Splenomegaly, not elsewhere classified: Secondary | ICD-10-CM | POA: Insufficient documentation

## 2023-12-24 NOTE — Assessment & Plan Note (Signed)
 Noted on abd ultrasound Oct 2025 for left sided pain and abd distension.  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

## 2023-12-25 DIAGNOSIS — I5032 Chronic diastolic (congestive) heart failure: Secondary | ICD-10-CM | POA: Diagnosis not present

## 2023-12-26 DIAGNOSIS — K7460 Unspecified cirrhosis of liver: Secondary | ICD-10-CM | POA: Insufficient documentation

## 2023-12-26 NOTE — Addendum Note (Signed)
 Addended by: MARYLYNN VERNEITA CROME on: 12/26/2023 09:37 AM   Modules accepted: Orders

## 2023-12-26 NOTE — Assessment & Plan Note (Signed)
 Suggested by prior biopsy in 2020 noting fibrosis , lobular contour of liver on recent abd ultrasound, wih splenomegaly and thrombocytopenia.  Referring to hematology and gastroenterology for confirmation and management

## 2024-01-05 ENCOUNTER — Inpatient Hospital Stay: Attending: Oncology | Admitting: Oncology

## 2024-01-05 ENCOUNTER — Inpatient Hospital Stay

## 2024-01-05 ENCOUNTER — Encounter: Payer: Self-pay | Admitting: Oncology

## 2024-01-05 VITALS — BP 108/57 | HR 73 | Temp 97.8°F | Resp 19 | Ht 72.0 in | Wt 216.7 lb

## 2024-01-05 DIAGNOSIS — R161 Splenomegaly, not elsewhere classified: Secondary | ICD-10-CM | POA: Diagnosis present

## 2024-01-05 DIAGNOSIS — I4891 Unspecified atrial fibrillation: Secondary | ICD-10-CM | POA: Diagnosis not present

## 2024-01-05 DIAGNOSIS — D696 Thrombocytopenia, unspecified: Secondary | ICD-10-CM | POA: Insufficient documentation

## 2024-01-05 DIAGNOSIS — E1122 Type 2 diabetes mellitus with diabetic chronic kidney disease: Secondary | ICD-10-CM | POA: Insufficient documentation

## 2024-01-05 DIAGNOSIS — N183 Chronic kidney disease, stage 3 unspecified: Secondary | ICD-10-CM | POA: Insufficient documentation

## 2024-01-05 DIAGNOSIS — Z7901 Long term (current) use of anticoagulants: Secondary | ICD-10-CM | POA: Diagnosis not present

## 2024-01-05 DIAGNOSIS — D539 Nutritional anemia, unspecified: Secondary | ICD-10-CM | POA: Insufficient documentation

## 2024-01-05 DIAGNOSIS — Z801 Family history of malignant neoplasm of trachea, bronchus and lung: Secondary | ICD-10-CM | POA: Insufficient documentation

## 2024-01-05 LAB — CBC WITH DIFFERENTIAL (CANCER CENTER ONLY)
Abs Immature Granulocytes: 0.01 K/uL (ref 0.00–0.07)
Basophils Absolute: 0 K/uL (ref 0.0–0.1)
Basophils Relative: 0 %
Eosinophils Absolute: 0.1 K/uL (ref 0.0–0.5)
Eosinophils Relative: 1 %
HCT: 30.9 % — ABNORMAL LOW (ref 39.0–52.0)
Hemoglobin: 10.9 g/dL — ABNORMAL LOW (ref 13.0–17.0)
Immature Granulocytes: 0 %
Lymphocytes Relative: 22 %
Lymphs Abs: 1.4 K/uL (ref 0.7–4.0)
MCH: 33 pg (ref 26.0–34.0)
MCHC: 35.3 g/dL (ref 30.0–36.0)
MCV: 93.6 fL (ref 80.0–100.0)
Monocytes Absolute: 0.5 K/uL (ref 0.1–1.0)
Monocytes Relative: 8 %
Neutro Abs: 4.3 K/uL (ref 1.7–7.7)
Neutrophils Relative %: 69 %
Platelet Count: 79 K/uL — ABNORMAL LOW (ref 150–400)
RBC: 3.3 MIL/uL — ABNORMAL LOW (ref 4.22–5.81)
RDW: 12.6 % (ref 11.5–15.5)
WBC Count: 6.3 K/uL (ref 4.0–10.5)
nRBC: 0 % (ref 0.0–0.2)

## 2024-01-05 LAB — IRON AND TIBC
Iron: 108 ug/dL (ref 45–182)
Saturation Ratios: 36 % (ref 17.9–39.5)
TIBC: 297 ug/dL (ref 250–450)
UIBC: 189 ug/dL

## 2024-01-05 LAB — TECHNOLOGIST SMEAR REVIEW: Plt Morphology: DECREASED

## 2024-01-05 LAB — VITAMIN B12: Vitamin B-12: 234 pg/mL (ref 180–914)

## 2024-01-05 LAB — IMMATURE PLATELET FRACTION: Immature Platelet Fraction: 3.3 % (ref 1.2–8.6)

## 2024-01-05 LAB — FOLATE: Folate: >20 ng/mL (ref >5.9)

## 2024-01-05 LAB — FERRITIN: Ferritin: 18 ng/mL — ABNORMAL LOW (ref 24–336)

## 2024-01-05 LAB — TSH: TSH: 1.182 u[IU]/mL (ref 0.350–4.500)

## 2024-01-05 NOTE — Progress Notes (Signed)
 Hematology/Oncology Consult note [facility] Telephone:([phone] [phone]  Patient Care Team: Marylynn Verneita CROME, MD as PCP - General (Internal Medicine) Perla Evalene PARAS, MD as Consulting Physician (Cardiology) Charlsie Josette SAILOR, RN as [facility] Management Melanee Annah BROCKS, MD as Consulting Physician (Oncology)   Name of the patient: [name] [name]  [id number]  May 16, 1933    Reason for referral-splenomegaly   Referring physician-Dr. Verneita Marylynn  Date of visit: 01/05/24   History of presenting illness-patient is a 89 year old male with a past medical history significant for type 2 diabetes, osteoarthritis and chronic sphenoid sinusitis.  He has a history of stage III CKD and atrial fibrillation as well for which she is on Eliquis . He has been referred for splenomegaly.  CBC from 09/26/2023 showed white count of 5.9, H&H of 11.6/36.8 with an MCV of 101.7 and a platelet count of 101.  Patient had a CT renal stone study in July 2025 which showed enlarged spleen of 16.9 cm.  This was followed by an ultrasound abdomen which showed spleen size measuring 14.2 x 14.6 x 6.9 cm within estimated volume of 751 mL.  Ultrasound however showed lobular liver contours which can be nonspecific but could be seen in the setting of cirrhosis.  Looking back at his prior CBCs patient has had chronic thrombocytopenia with a platelet count fluctuating between 70s to 120s at least dating back to 2021.  CMP showed creatinine that has been around 1.5 and LFTs have been normal.   He has a history of atrial fibrillation for which he is on Eliquis . He also has stage three chronic kidney disease and type two diabetes. He underwent a five-way coronary artery bypass graft approximately 12-14 years ago. He experiences reduced energy and stamina, particularly when walking, which he manages by resting to catch his breath. He uses a semi-electric bike for exercise, riding about four days a week,  which helps him manage [place].  He monitors his weight daily, which typically remains around 203-204 pounds, but increases to 207 pounds occasionally, likely due to fluid retention. He manages this with furosemide  and potassium, adjusting the dose based on his weight. No significant changes in appetite or weight, attributing fluctuations to water retention. He is trying to increase his water intake.       ECOG PS- 1  Pain scale- 2   Review of systems- Review of Systems  Constitutional:  Positive for malaise/fatigue.    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, NYHA class 1 and CKD stage 3 (HCC) 12/13/2023   Exudative age-related macular degeneration, bilateral, with inactive choroidal neovascularization (HCC) 12/13/2023   Chronic kidney disease, stage 3b (HCC) 12/13/2023   Venous insufficiency of both lower extremities 12/13/2023   Chronic sphenoidal sinusitis 12/12/2023   Orthostatic hypotension 11/17/2023   Chronic left shoulder pain 05/02/2023   Allergic rhinitis 05/18/2022   Anxiety 04/01/2022   Type 2 diabetes mellitus with complication, with long-term current use of insulin  (HCC) 04/01/2022   Gastroenteritis 03/23/2022   Nausea and vomiting in adult 03/14/2022   History of CVA (cerebrovascular accident) 01/26/2022   Atrial fibrillation, chronic (HCC)  01/26/2022   Atrial flutter (HCC) 11/05/2021   Chronic anticoagulation 11/05/2021   De Quervain's tenosynovitis, left 10/05/2020   Abnormal MRI, lumbar spine 08/12/2020   History of COVID-19 07/22/2020   Scrotal lesion 03/12/2020   Cellulitis 05/12/2019   Postherpetic neuralgia  05/09/2019   B12 deficiency 05/09/2019   Pulmonary hypertension, unspecified (HCC) 04/05/2019   Atypical chest pain 01/08/2019   Vertigo 10/15/2018   Diabetic retinopathy (HCC) 09/20/2018   Chronic low back pain 03/05/2018   Breast tenderness in male 01/18/2018   PAC (premature atrial contraction) 01/18/2018   Neuropathic pain 01/18/2018   Bell's palsy 08/18/2017   H/O cold sores 08/18/2017   Insomnia 11/10/2016   PLMD (periodic limb movement disorder) 08/17/2016   Mild OSA 08/17/2016   Intolerance of continuous positive airway pressure (CPAP) ventilation 08/17/2016   Chronic arthritis 06/02/2016   CKD stage 3 due to type 2 diabetes mellitus (HCC) 06/02/2016   Diastolic dysfunction 05/04/2016   Chronic pain of both knees 10/06/2015   Spondylosis of lumbar region without myelopathy or radiculopathy 10/06/2015   S/P CABG x 4    Thrombocytopenia 02/07/2015   DM2 (diabetes mellitus, type 2) (HCC) 02/06/2015   Chronic diastolic CHF (congestive heart failure) (HCC) 01/01/2014   CAD (coronary artery disease) 06/03/2013   Diabetic neuropathy (HCC) 11/12/2012   BPH (benign prostatic hyperplasia)    GERD (gastroesophageal reflux disease)    Hyperlipidemia 09/15/2009   Essential hypertension 09/15/2009     Past Medical History:  Diagnosis Date   (HFpEF) heart failure with preserved ejection fraction (HCC)    a. 06/2007 Echo: nl systolic fxn, mild LVH, diastolic relaxation abnormality, mildly enlarged LA, mild Ao insufficiency; 01/2018 Echo: EF 55-60%, no rwma, GR2 DD, triv AI, mildly dil LA, mild inc PASP.   AMS (altered mental status) 02/03/2021   BPH (benign prostatic hyperplasia)    Cancer (HCC)    skin   Cataract    CHF (congestive heart failure) (HCC)    takes water pills but does not know what for.   Choledocholithiasis    a. 05/2020 ERCP w/ CBD stone removal and biliary sphincterotomy.   Coronary artery disease    a. 01/2005 s/p CABG x 4: LIMA-LAD, VG-Diag, VG-OM3,  VG-dRCA; b. 06/2013 Cath: patent grafts; c. 09/2015 Cath: VG->Diag 100, other grafts patent. LAD 100, RCA sev dzs->Med rx; d. 01/2018 Cath: LAD 100ost/p, LCX 50, RCA 100p/d, VG->OM3 100, VG->Diag 100, LIMA->LAD ok, VG->RPAV 76m-->Med rx.   COVID-19    07/2020 had paxlovid    Dyspnea    with exertion   Dysrhythmia    ED (erectile dysfunction)    Elevated LFTs    a. 05/2020 following CBD stone. 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   GERD (gastroesophageal reflux disease)    Hyperlipidemia    Hypertension    IDDM (insulin  dependent diabetes mellitus) 2000   Laceration of right hand without foreign body 02/07/2022   Osteoarthritis    PAH (pulmonary artery hypertension) (HCC)    a. 01/2018 RHC: PA 51/22 (32).   Perirectal fistula    Pneumonia 12/2016   Shingles 09/04/2018   Stroke (HCC) 2023   mild L hemiparesis   Tubular adenoma of colon 07/2012   Vertigo      Past Surgical History:  Procedure Laterality Date   BACK SURGERY     CARDIAC CATHETERIZATION  06/05/2013   [facility]    CARDIAC CATHETERIZATION N/A 09/24/2015   Procedure: Right Heart  Cath and Coronary/Graft Angiography;  Surgeon: Evalene JINNY Lunger, MD;  Location: [facility] INVASIVE CV LAB;  Service: Cardiovascular;  Laterality: N/A;   CATARACT EXTRACTION  sept 2013   right   CATARACT EXTRACTION W/PHACO Left 03/28/2017   Procedure: CATARACT EXTRACTION PHACO AND INTRAOCULAR LENS PLACEMENT (IOC);  Surgeon: Jaye Fallow, MD;  Location: [facility] ORS;  Service: Ophthalmology;  Laterality: Left;  US  00:42.0 AP% 15.0 CDE 6.31 Fluid Pack lot # [id number]   CHOLECYSTECTOMY  05/15/2011   Procedure: LAPAROSCOPIC CHOLECYSTECTOMY;  Surgeon: Donnice Bury, MD;  Location: [facility] OR;  Service: General;  Laterality: N/A;   COLONOSCOPY W/ POLYPECTOMY     CORONARY ARTERY BYPASS GRAFT  2007   x 4   CORONARY STENT INTERVENTION N/A 12/08/2020   Procedure: CORONARY STENT INTERVENTION;  Surgeon: Mady Bruckner, MD;  Location: [facility] INVASIVE CV LAB;  Service: Cardiovascular;  Laterality: N/A;   ERCP N/A 05/05/2020   Procedure: ENDOSCOPIC RETROGRADE CHOLANGIOPANCREATOGRAPHY (ERCP);  Surgeon: Jinny Carmine, MD;  Location: [facility] ENDOSCOPY;  Service: Endoscopy;  Laterality: N/A;   ERCP N/A 05/12/2020   Procedure: ENDOSCOPIC RETROGRADE CHOLANGIOPANCREATOGRAPHY (ERCP);  Surgeon: Jinny Carmine, MD;  Location: [facility] ENDOSCOPY;  Service: Endoscopy;  Laterality: N/A;   ESOPHAGOGASTRODUODENOSCOPY N/A 05/12/2020   Procedure: ESOPHAGOGASTRODUODENOSCOPY (EGD);  Surgeon: Jinny Carmine, MD;  Location: [facility] ENDOSCOPY;  Service: Endoscopy;  Laterality: N/A;   EYE SURGERY     FOOT SURGERY  2012   right foot   JOINT REPLACEMENT  8/10   Right THR--[name]   LEFT AND RIGHT HEART CATHETERIZATION WITH CORONARY/GRAFT ANGIOGRAM N/A 06/05/2013   Procedure: LEFT AND RIGHT HEART CATHETERIZATION WITH EL BILE;  Surgeon: Peter M Jordan, MD;  Location: [facility] CATH LAB;  Service: Cardiovascular;  Laterality: N/A;   LUMBAR LAMINECTOMY  1989   Prostate photovaporization  5/16   Dr Jere   RIGHT/LEFT HEART CATH AND CORONARY/GRAFT ANGIOGRAPHY N/A 01/05/2018   Procedure: RIGHT/LEFT HEART CATH AND CORONARY/GRAFT ANGIOGRAPHY;  Surgeon: Lunger Evalene JINNY, MD;  Location: [facility] INVASIVE CV LAB;  Service: Cardiovascular;  Laterality: N/A;   RIGHT/LEFT HEART CATH AND CORONARY/GRAFT ANGIOGRAPHY Bilateral 12/08/2020   Procedure: RIGHT/LEFT HEART CATH AND CORONARY/GRAFT ANGIOGRAPHY;  Surgeon: Mady Bruckner, MD;  Location: [facility] INVASIVE CV LAB;  Service: Cardiovascular;  Laterality: Bilateral;    Social History   Socioeconomic History   Marital status: Married    Spouse name: Jan   Number of children: 4   Years of education: Not on file   Highest education level: Not on file  Occupational History   Occupation: Warden/ranger    Comment: animator  Tobacco Use   Smoking status: Never    Smokeless tobacco: Never  Vaping Use   Vaping status: Never Used  Substance and Sexual Activity   Alcohol use: Yes    Comment: occ cocktail   Drug use: No   Sexual activity: Not Currently  Other Topics Concern   Not on file  Social History Narrative   Goes by [place]   Has living will    Has designated [employer] as health care POA   Would accept resuscitation attempts   Would not want tube feeds if cognitively unaware   Social Drivers of Health   Financial Resource Strain: Low Risk  (07/24/2023)   Overall Financial Resource Strain (CARDIA)    Difficulty of Paying Living Expenses: Not hard at all  Food Insecurity: No Food Insecurity (07/25/2023)   Hunger Vital Sign    Worried About Running Out of Food in the Last  Year: Never true    Ran Out of Food in the Last Year: Never true  Transportation Needs: No Transportation Needs (07/25/2023)   PRAPARE - Administrator, Civil Service (Medical): No    Lack of Transportation (Non-Medical): No  Physical Activity: Inactive (07/24/2023)   Exercise Vital Sign    Days of Exercise per Week: 0 days    Minutes of Exercise per Session: 0 min  Stress: Stress Concern Present (07/24/2023)   [employer] of Occupational Health - Occupational Stress Questionnaire    Feeling of Stress : Very much  Social Connections: Moderately Integrated (07/24/2023)   Social Connection and Isolation Panel    Frequency of Communication with Friends and Family: More than three times a week    Frequency of Social Gatherings with Friends and Family: Once a week    Attends Religious Services: More than 4 times per year    Active Member of [employer] or Organizations: No    Attends Banker Meetings: Never    Marital Status: Living with partner  Intimate Partner Violence: Not At Risk (07/25/2023)   Humiliation, Afraid, Rape, and Kick questionnaire    Fear of Current or Ex-Partner: No    Emotionally Abused: No    Physically Abused: No     Sexually Abused: No     Family History  Problem Relation Age of Onset   Lung cancer Mother    Heart disease Father    Colon cancer Neg Hx    Breast cancer Neg Hx      Current Outpatient Medications:    acetaminophen  (TYLENOL ) 325 MG tablet, Take by mouth., Disp: , Rfl:    azelastine  (ASTELIN ) 0.1 % nasal spray, Place 2 sprays into both nostrils 2 (two) times daily. Use in each nostril as directed, Disp: 30 mL, Rfl: 2   BD PEN NEEDLE NANO U/F 32G X 4 MM MISC, USE AS DIRECTED, Disp: 100 each, Rfl: 0   carvedilol  (COREG ) 12.5 MG tablet, TAKE ONE (1) TABLET BY MOUTH TWO TIMES PER DAY, Disp: 60 tablet, Rfl: 0   Cholecalciferol  (VITAMIN D3) 50 MCG (2000 UT) TABS, Take 2,000 Units by mouth in the morning., Disp: , Rfl:    clotrimazole  (LOTRIMIN ) 1 % cream, Apply 1 Application topically 2 (two) times daily., Disp: , Rfl:    doxazosin  (CARDURA ) 2 MG tablet, Take 1 tablet (2 mg total) by mouth 2 (two) times daily. Please keep upcoming appt for future refills, Disp: 180 tablet, Rfl: 0   ELIQUIS  2.5 MG TABS tablet, Take 2.5 mg by mouth 2 (two) times daily., Disp: , Rfl:    ezetimibe  (ZETIA ) 10 MG tablet, TAKE 1 TABLET BY MOUTH DAILY, Disp: 90 tablet, Rfl: 0   finasteride  (PROSCAR ) 5 MG tablet, Take 1 tablet (5 mg total) by mouth daily., Disp: 90 tablet, Rfl: 1   gabapentin  (NEURONTIN ) 100 MG capsule, TAKE 1 CAPSULE BY MOUTH TWO TIMES DAILY (Patient not taking: Reported on 12/12/2023), Disp: 60 capsule, Rfl: 2   HUMALOG KWIKPEN 100 UNIT/ML KwikPen, Inject 6 Units into the skin 3 (three) times daily with meals., Disp: , Rfl:    hydrALAZINE  (APRESOLINE ) 25 MG tablet, Take 1 tablet (25 mg total) by mouth 3 (three) times daily., Disp: 270 tablet, Rfl: 3   insulin  degludec (TRESIBA FLEXTOUCH) 100 UNIT/ML FlexTouch Pen, Inject into the skin daily. Patient takes 28-30 units 1 x per day. 115-125 takes 28 units, > 125 can take 29-30 units, Disp: , Rfl:  isosorbide  mononitrate (IMDUR ) 30 MG 24 hr tablet, Take 1  tablet (30 mg total) by mouth daily. Please call [phone] for a sooner appointment and for further refills., Disp: 30 tablet, Rfl: 0   loperamide  (IMODIUM  A-D) 2 MG tablet, Take 1 tablet (2 mg total) by mouth 3 (three) times daily as needed for diarrhea or loose stools., Disp: 30 tablet, Rfl: 0   meclizine  (ANTIVERT ) 12.5 MG tablet, Take 1 tablet (12.5 mg total) by mouth 3 (three) times daily as needed for dizziness., Disp: 30 tablet, Rfl: 0   Multiple Vitamins-Minerals (PRESERVISION AREDS 2 PO), Take 1 tablet by mouth in the morning and at bedtime., Disp: , Rfl:    omeprazole  (PRILOSEC) 20 MG capsule, TAKE 1 CAPSULE (20 MG) BY MOUTH TWICE DAILY, Disp: 180 capsule, Rfl: 1   potassium chloride  (KLOR-CON ) 10 MEQ tablet, Take 1 tablet (10 mEq total) by mouth daily as needed. Take with torsemide , Disp: 90 tablet, Rfl: 3   rosuvastatin  (CRESTOR ) 20 MG tablet, Take 20 mg by mouth daily., Disp: , Rfl:    torsemide  (DEMADEX ) 20 MG tablet, FOR WEIGHT 210-215 TAKE 1 TABLET WITH POTASSIUM  FOR WEIGHT GREATER THAN 215 TAKE 1 TABLET TWICE A DAY (8AM & 2PM) WITH 2 POTASSIUM, Disp: 200 tablet, Rfl: 1   traZODone  (DESYREL ) 50 MG tablet, Take 0.5-1 tablets (25-50 mg total) by mouth at bedtime as needed for sleep., Disp: 90 tablet, Rfl: 0   Physical exam: There were no vitals filed for this visit. Physical Exam Cardiovascular:     Rate and Rhythm: Normal rate and regular rhythm.     Heart sounds: Normal heart sounds.  Pulmonary:     Effort: Pulmonary effort is normal.     Breath sounds: Normal breath sounds.  Abdominal:     General: Bowel sounds are normal. There is no distension.     Palpations: Abdomen is soft.     Tenderness: There is no abdominal tenderness.     Comments: No palpable splenomegaly  Musculoskeletal:     Cervical back: Normal range of motion.  Lymphadenopathy:     Comments: No palpable cervical, supraclavicular, axillary or inguinal adenopathy    Skin:    General: Skin is warm and  dry.  Neurological:     Mental Status: He is alert and oriented to person, place, and time.           Latest Ref Rng & Units 12/12/2023    2:38 PM  CMP  Glucose 70 - 99 mg/dL 787   BUN 6 - 23 mg/dL 45   Creatinine 9.59 - 1.50 mg/dL 8.09   Sodium 864 - 854 mEq/L 137   Potassium 3.5 - 5.1 mEq/L 4.7   Chloride 96 - 112 mEq/L 105   CO2 19 - 32 mEq/L 24   Calcium  8.4 - 10.5 mg/dL 9.2   Total Protein 6.0 - 8.3 g/dL 6.5   Total Bilirubin 0.2 - 1.2 mg/dL 0.6   Alkaline Phos 39 - 117 U/L 96   AST 0 - 37 U/L 17   ALT 0 - 53 U/L 11       Latest Ref Rng & Units 09/16/2023    1:00 PM  CBC  WBC 4.0 - 10.5 K/uL 10.8   Hemoglobin 13.0 - 17.0 g/dL 88.7   Hematocrit 60.9 - 52.0 % 33.9   Platelets 150 - 400 K/uL 66     No images are attached to the encounter.  US  Abdomen Complete Result Date: 12/21/2023 CLINICAL  DATA:  DIFFUSE TENDERNESS,  DISTENSION, DYSPNEA EXAM: ABDOMEN ULTRASOUND COMPLETE COMPARISON:  July twelfth 2025 FINDINGS: Gallbladder: Surgically absent Common bile duct: Diameter: Visualized portion measures 4 mm, within normal limits. Liver: No focal lesion identified. Within normal limits in parenchymal echogenicity. Lobular liver contours. Portal vein is patent on color Doppler imaging with normal direction of blood flow towards the liver. IVC: No abnormality visualized. Pancreas: Visualized portion unremarkable. Spleen: Spleen is enlarged spanning 14.2 x 14.6 x 6.9 cm for an estimated volume of 751 ML. Right Kidney: Length: 12.6 cm. Echogenicity is mildly increased with mild cortical thinning. No mass or hydronephrosis visualized. Left Kidney: Length: 12.8 cm. Echogenicity is mildly increased with mild cortical thinning. No hydronephrosis visualized. Benign exophytic cyst of the inferior pole is noted measuring 16 mm (for which no dedicated imaging follow-up is recommended). Abdominal aorta: No aneurysm visualized. Other findings: None. IMPRESSION: 1. Splenomegaly. 2. Lobular liver  contours. This is nonspecific but can be seen in the setting of cirrhosis. 3. Mildly increased renal echogenicity with mild cortical thinning. This is nonspecific but can be seen in the setting of medical renal disease. Electronically Signed   By: Corean Salter M.D.   On: 12/21/2023 08:32    Assessment and plan- Patient is a 89 y.o. male referred for splenomegaly  Assessment and Plan    Splenomegaly with thrombocytopenia Spleen enlarged to 16.9 cm on CT and 14.6 cm on ultrasound, volume 750 mL. Liver normal on CT, lobular on ultrasound, possible cirrhosis. Platelets 80,000-120,000 over 3 years. Differential includes infection, autoimmune, cirrhosis, blood disorders/cancers. No significant WBC or hemoglobin abnormalities. Spleen not dangerously enlarged, no immediate aggressive intervention needed. - Repeat abdominal ultrasound in 3-4 months to monitor spleen size. - will check cbc with diff, b12, folate, flow cytometry, myeloma panel, FLC - Consider PET scan if spleen size increases or remains the same to evaluate for blood cancer. - Discuss potential for bone marrow biopsy if PET scan indicates abnormal spleen activity.      Thank you for this kind referral and the opportunity to participate in the care of this  Patient   Visit Diagnosis 1. Splenomegaly   2. Macrocytic anemia     Dr. Annah Skene, MD, MPH [facility] [phone] 01/05/2024

## 2024-01-05 NOTE — Progress Notes (Signed)
 New patient; Splenomegaly; referred by Dr. Marylynn.

## 2024-01-08 LAB — KAPPA/LAMBDA LIGHT CHAINS
Kappa free light chain: 34.7 mg/L — ABNORMAL HIGH (ref 3.3–19.4)
Kappa, lambda light chain ratio: 1.52 (ref 0.26–1.65)
Lambda free light chains: 22.9 mg/L (ref 5.7–26.3)

## 2024-01-09 ENCOUNTER — Other Ambulatory Visit: Payer: Self-pay | Admitting: Internal Medicine

## 2024-01-09 MED ORDER — POTASSIUM CHLORIDE ER 10 MEQ PO TBCR: 10.0000 meq | EXTENDED_RELEASE_TABLET | Freq: Every day | ORAL | 3 refills | Status: AC | PRN

## 2024-01-09 NOTE — Telephone Encounter (Signed)
 patient was advised to contact his provider, he takes this medication along with torsemide  (DEMADEX ) 20 MG tablet [[id number]].

## 2024-01-09 NOTE — Telephone Encounter (Signed)
 Copied from CRM #[id number]. Topic: Clinical - Medication Refill >> Jan 09, 2024  9:42 AM [name] [name] wrote: Medication: potassium chloride  (KLOR-CON ) 10 MEQ tablet [[id number]]  Has the patient contacted their pharmacy? Yes, patient was advised to contact his provider, he takes this medication along with torsemide  (DEMADEX ) 20 MG tablet [[id number]]. (Agent: If no, request that the patient contact the pharmacy for the refill. If patient does not wish to contact the pharmacy document the reason why and proceed with request.) (Agent: If yes, when and what did the pharmacy advise?)  This is the patient's preferred pharmacy:  [facility] - [place], [place] - [name] [place] [address] Phone: [phone] Fax: [phone]   Is this the correct pharmacy for this prescription? Yes If no, delete pharmacy and type the correct one.   Has the prescription been filled recently? No  Is the patient out of the medication? Yes  Has the patient been seen for an appointment in the last year OR does the patient have an upcoming appointment? Yes  Can we respond through MyChart? Yes  Agent: Please be advised that Rx refills may take up to 3 business days. We ask that you follow-up with your pharmacy.

## 2024-01-10 LAB — COMP PANEL: LEUKEMIA/LYMPHOMA

## 2024-01-11 ENCOUNTER — Other Ambulatory Visit: Payer: Self-pay | Admitting: *Deleted

## 2024-01-12 LAB — MULTIPLE MYELOMA PANEL, SERUM
Albumin SerPl Elph-Mcnc: 3.8 g/dL (ref 2.9–4.4)
Albumin/Glob SerPl: 1.5 (ref 0.7–1.7)
Alpha 1: 0.2 g/dL (ref 0.0–0.4)
Alpha2 Glob SerPl Elph-Mcnc: 0.6 g/dL (ref 0.4–1.0)
B-Globulin SerPl Elph-Mcnc: 0.9 g/dL (ref 0.7–1.3)
Gamma Glob SerPl Elph-Mcnc: 0.8 g/dL (ref 0.4–1.8)
Globulin, Total: 2.6 g/dL (ref 2.2–3.9)
IgA: 237 mg/dL (ref 61–437)
IgG (Immunoglobin G), Serum: 941 mg/dL (ref 603–1613)
IgM (Immunoglobulin M), Srm: 72 mg/dL (ref 15–143)
Total Protein ELP: 6.4 g/dL (ref 6.0–8.5)

## 2024-01-15 ENCOUNTER — Other Ambulatory Visit: Payer: Self-pay | Admitting: *Deleted

## 2024-01-18 ENCOUNTER — Other Ambulatory Visit: Payer: Self-pay | Admitting: Internal Medicine

## 2024-01-23 ENCOUNTER — Inpatient Hospital Stay: Attending: Oncology | Admitting: Oncology

## 2024-01-23 ENCOUNTER — Inpatient Hospital Stay

## 2024-01-23 ENCOUNTER — Encounter: Payer: Self-pay | Admitting: Oncology

## 2024-01-23 VITALS — BP 106/49 | HR 71 | Temp 97.9°F | Resp 19 | Ht 72.0 in | Wt 215.0 lb

## 2024-01-23 VITALS — BP 120/64 | HR 70 | Resp 18

## 2024-01-23 DIAGNOSIS — E538 Deficiency of other specified B group vitamins: Secondary | ICD-10-CM

## 2024-01-23 DIAGNOSIS — D509 Iron deficiency anemia, unspecified: Secondary | ICD-10-CM | POA: Diagnosis not present

## 2024-01-23 DIAGNOSIS — R140 Abdominal distension (gaseous): Secondary | ICD-10-CM

## 2024-01-23 DIAGNOSIS — R10817 Generalized abdominal tenderness: Secondary | ICD-10-CM

## 2024-01-23 DIAGNOSIS — R161 Splenomegaly, not elsewhere classified: Secondary | ICD-10-CM | POA: Diagnosis not present

## 2024-01-23 DIAGNOSIS — R0602 Shortness of breath: Secondary | ICD-10-CM

## 2024-01-23 MED ORDER — IRON SUCROSE 20 MG/ML IV SOLN
200.0000 mg | INTRAVENOUS | Status: DC
  Administered 2024-01-23: 200 mg via INTRAVENOUS
  Filled 2024-01-23: qty 10

## 2024-01-23 NOTE — Progress Notes (Signed)
 Hematology/Oncology Consult note [facility]  [phone] [phone]  Patient Care Team: Marylynn Verneita CROME, MD as PCP - General (Internal Medicine) Perla Evalene PARAS, MD as Consulting Physician (Cardiology) Charlsie Josette SAILOR, RN as [facility] Management Melanee Annah BROCKS, MD as Consulting Physician (Oncology)   Name of the patient: [name] [name]  [id number]  1934-01-05   Date of visit: 01/23/24  Diagnosis- 1.  History of iron B12 deficiency anemia 2.  Splenomegaly possibly secondary to cirrhosis etiology unclear  Chief complaint/ Reason for visit-discuss results of blood work  Heme/Onc history: patient is a 89 year old male with a past medical history significant for type 2 diabetes, osteoarthritis and chronic sphenoid sinusitis.  He has a history of stage III CKD and atrial fibrillation as well for which she is on Eliquis . He has been referred for splenomegaly.  CBC from 09/26/2023 showed white count of 5.9, H&H of 11.6/36.8 with an MCV of 101.7 and a platelet count of 101.  Patient had a CT renal stone study in July 2025 which showed enlarged spleen of 16.9 cm.  This was followed by an ultrasound abdomen which showed spleen size measuring 14.2 x 14.6 x 6.9 cm within estimated volume of 751 mL.  Ultrasound however showed lobular liver contours which can be nonspecific but could be seen in the setting of cirrhosis.  Looking back at his prior CBCs patient has had chronic thrombocytopenia with a platelet count fluctuating between 70s to 120s at least dating back to 2021.  CMP showed creatinine that has been around 1.5 and LFTs have been normal.     He has a history of atrial fibrillation for which he is on Eliquis . He also has stage three chronic kidney disease and type two diabetes. He underwent a five-way coronary artery bypass graft approximately 12-14 years ago. He experiences reduced energy and stamina, particularly when walking, which he manages by  resting to catch his breath. He uses a semi-electric bike for exercise, riding about four days a week, which helps him manage [place].   Labs from October 2025 showed a moderate anemia with a hemoglobin of 10.7 with evidence of iron B12 deficiency.  Interval history- Discussed the use of AI scribe software for clinical note transcription with the patient, who gave verbal consent to proceed.  History of Present Illness   [name] [name] is an 89 year old male who presents with an enlarged spleen and anemia. He was referred for evaluation of an enlarged spleen.  An ultrasound in October revealed an enlarged spleen measuring 14 cm. Imaging showed an uneven liver surface.  He experiences fatigue and a lack of energy, which he attributes to his heart condition. Recent blood work showed anemia with a hemoglobin level of 10.9 and low iron levels. His vitamin B12 level is 234. Previously, his hemoglobin has been as high as 12.4, but it is currently below the normal range for men, which is between 13 to 15.      History of Present Illness  ECOG PS- 1 Pain scale- 0   Review of systems- Review of Systems  Constitutional:  Positive for malaise/fatigue. Negative for chills, fever and weight loss.  HENT:  Negative for congestion, ear discharge and nosebleeds.   Eyes:  Negative for blurred vision.  Respiratory:  Negative for cough, hemoptysis, sputum production, shortness of breath and wheezing.   Cardiovascular:  Negative for chest pain, palpitations, orthopnea and claudication.  Gastrointestinal:  Negative for abdominal pain, blood in stool, constipation, diarrhea, heartburn, melena, nausea and vomiting.  Genitourinary:  Negative for dysuria, flank pain, frequency, hematuria and urgency.  Musculoskeletal:  Negative for back pain, joint pain and myalgias.  Skin:  Negative for rash.  Neurological:  Negative for dizziness, tingling, focal weakness, seizures, weakness and  headaches.  Endo/Heme/Allergies:  Does not bruise/bleed easily.  Psychiatric/Behavioral:  Negative for depression and suicidal ideas. The patient does not have insomnia.       Allergies  Allergen Reactions   Cortisone Other (See Comments)    Leg Cramps   Metformin  Diarrhea   Semaglutide Nausea Only and Other (See Comments)    Constipation     Past Medical History:  Diagnosis Date   (HFpEF) heart failure with preserved ejection fraction (HCC)    a. 06/2007 Echo: nl systolic fxn, mild LVH, diastolic relaxation abnormality, mildly enlarged LA, mild Ao insufficiency; 01/2018 Echo: EF 55-60%, no rwma, GR2 DD, triv AI, mildly dil LA, mild inc PASP.   AMS (altered mental status) 02/03/2021   BPH (benign prostatic hyperplasia)    Cancer (HCC)    skin   Cataract    CHF (congestive heart failure) (HCC)    takes water pills but does not know what for.   Choledocholithiasis    a. 05/2020 ERCP w/ CBD stone removal and biliary sphincterotomy.   Coronary artery disease    a. 01/2005 s/p CABG x 4: LIMA-LAD, VG-Diag, VG-OM3, VG-dRCA; b. 06/2013 Cath: patent grafts; c. 09/2015 Cath: VG->Diag 100, other grafts patent. LAD 100, RCA sev dzs->Med rx; d. 01/2018 Cath: LAD 100ost/p, LCX 50, RCA 100p/d, VG->OM3 100, VG->Diag 100, LIMA->LAD ok, VG->RPAV 87m-->Med rx.   COVID-19    07/2020 had paxlovid    Dyspnea    with exertion   Dysrhythmia    ED (erectile dysfunction)    Elevated LFTs    a. 05/2020 following CBD stone. 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   GERD (gastroesophageal reflux disease)    Hyperlipidemia    Hypertension    IDDM (insulin  dependent diabetes mellitus) 2000   Laceration of right hand without foreign body 02/07/2022   Osteoarthritis    PAH (pulmonary artery hypertension) (HCC)    a. 01/2018 RHC: PA 51/22 (32).   Perirectal fistula    Pneumonia 12/2016   Shingles 09/04/2018   Stroke (HCC) 2023   mild L hemiparesis    Tubular adenoma of colon 07/2012   Vertigo      Past Surgical History:  Procedure Laterality Date   BACK SURGERY     CARDIAC CATHETERIZATION  06/05/2013   [facility]    CARDIAC CATHETERIZATION N/A 09/24/2015   Procedure: Right Heart Cath and Coronary/Graft Angiography;  Surgeon: Evalene JINNY Lunger, MD;  Location: [facility] INVASIVE CV LAB;  Service: Cardiovascular;  Laterality: N/A;   CATARACT EXTRACTION  sept 2013   right   CATARACT EXTRACTION W/PHACO Left 03/28/2017   Procedure: CATARACT EXTRACTION PHACO AND INTRAOCULAR LENS PLACEMENT (IOC);  Surgeon: Jaye Fallow, MD;  Location: [facility] ORS;  Service: Ophthalmology;  Laterality: Left;  US  00:42.0 AP% 15.0 CDE 6.31 Fluid Pack lot # [id number]   CHOLECYSTECTOMY  05/15/2011   Procedure: LAPAROSCOPIC CHOLECYSTECTOMY;  Surgeon: Donnice Bury, MD;  Location: [facility] OR;  Service: General;  Laterality: N/A;   COLONOSCOPY W/ POLYPECTOMY     CORONARY ARTERY BYPASS GRAFT  2007   x 4   CORONARY STENT INTERVENTION N/A 12/08/2020   Procedure:  CORONARY STENT INTERVENTION;  Surgeon: Mady Bruckner, MD;  Location: [facility] INVASIVE CV LAB;  Service: Cardiovascular;  Laterality: N/A;   ERCP N/A 05/05/2020   Procedure: ENDOSCOPIC RETROGRADE CHOLANGIOPANCREATOGRAPHY (ERCP);  Surgeon: Jinny Carmine, MD;  Location: [facility] ENDOSCOPY;  Service: Endoscopy;  Laterality: N/A;   ERCP N/A 05/12/2020   Procedure: ENDOSCOPIC RETROGRADE CHOLANGIOPANCREATOGRAPHY (ERCP);  Surgeon: Jinny Carmine, MD;  Location: [facility] ENDOSCOPY;  Service: Endoscopy;  Laterality: N/A;   ESOPHAGOGASTRODUODENOSCOPY N/A 05/12/2020   Procedure: ESOPHAGOGASTRODUODENOSCOPY (EGD);  Surgeon: Jinny Carmine, MD;  Location: [facility] ENDOSCOPY;  Service: Endoscopy;  Laterality: N/A;   EYE SURGERY     FOOT SURGERY  2012   right foot   JOINT REPLACEMENT  8/10   Right THR--[name]   LEFT AND RIGHT HEART CATHETERIZATION WITH CORONARY/GRAFT ANGIOGRAM N/A 06/05/2013   Procedure: LEFT AND RIGHT HEART CATHETERIZATION WITH  EL BILE;  Surgeon: Peter M Jordan, MD;  Location[facility] CATH LAB;  Service: Cardiovascular;  Laterality: N/A;   LUMBAR LAMINECTOMY  1989   Prostate photovaporization  5/16   Dr Jere   RIGHT/LEFT HEART CATH AND CORONARY/GRAFT ANGIOGRAPHY N/A 01/05/2018   Procedure: RIGHT/LEFT HEART CATH AND CORONARY/GRAFT ANGIOGRAPHY;  Surgeon: Perla Evalene PARAS, MD;  Location: [facility] INVASIVE CV LAB;  Service: Cardiovascular;  Laterality: N/A;   RIGHT/LEFT HEART CATH AND CORONARY/GRAFT ANGIOGRAPHY Bilateral 12/08/2020   Procedure: RIGHT/LEFT HEART CATH AND CORONARY/GRAFT ANGIOGRAPHY;  Surgeon: Mady Bruckner, MD;  Location: [facility] INVASIVE CV LAB;  Service: Cardiovascular;  Laterality: Bilateral;    Social History   Socioeconomic History   Marital status: Married    Spouse name: Jan   Number of children: 4   Years of education: Not on file   Highest education level: Not on file  Occupational History   Occupation: Warden/ranger    Comment: animator  Tobacco Use   Smoking status: Never   Smokeless tobacco: Never  Vaping Use   Vaping status: Never Used  Substance and Sexual Activity   Alcohol use: Yes    Comment: occ cocktail   Drug use: No   Sexual activity: Not Currently  Other Topics Concern   Not on file  Social History Narrative   Goes by [place]   Has living will    Has designated [employer] as health care POA   Would accept resuscitation attempts   Would not want tube feeds if cognitively unaware   Social Drivers of Health   Financial Resource Strain: Low Risk  (07/24/2023)   Overall Financial Resource Strain (CARDIA)    Difficulty of Paying Living Expenses: Not hard at all  Food Insecurity: No Food Insecurity (01/05/2024)   Hunger Vital Sign    Worried About Running Out of Food in the Last Year: Never true    Ran Out of Food in the Last Year: Never true  Transportation Needs: No Transportation Needs (01/05/2024)   PRAPARE -  Administrator, Civil Service (Medical): No    Lack of Transportation (Non-Medical): No  Physical Activity: Inactive (07/24/2023)   Exercise Vital Sign    Days of Exercise per Week: 0 days    Minutes of Exercise per Session: 0 min  Stress: Stress Concern Present (07/24/2023)   [employer] of Occupational Health - Occupational Stress Questionnaire    Feeling of Stress : Very much  Social Connections: Moderately Integrated (07/24/2023)   Social Connection and Isolation Panel    Frequency of Communication with Friends and Family: More than three times a week  Frequency of Social Gatherings with Friends and Family: Once a week    Attends Religious Services: More than 4 times per year    Active Member of [employer] or Organizations: No    Attends Banker Meetings: Never    Marital Status: Living with partner  Intimate Partner Violence: Not At Risk (01/05/2024)   Humiliation, Afraid, Rape, and Kick questionnaire    Fear of Current or Ex-Partner: No    Emotionally Abused: No    Physically Abused: No    Sexually Abused: No    Family History  Problem Relation Age of Onset   Lung cancer Mother    Heart disease Father    Colon cancer Neg Hx    Breast cancer Neg Hx      Current Outpatient Medications:    acetaminophen  (TYLENOL ) 325 MG tablet, Take by mouth., Disp: , Rfl:    azelastine  (ASTELIN ) 0.1 % nasal spray, Place 2 sprays into both nostrils 2 (two) times daily. Use in each nostril as directed, Disp: 30 mL, Rfl: 2   BD PEN NEEDLE NANO U/F 32G X 4 MM MISC, USE AS DIRECTED, Disp: 100 each, Rfl: 0   carvedilol  (COREG ) 12.5 MG tablet, TAKE ONE (1) TABLET BY MOUTH TWO TIMES PER DAY, Disp: 60 tablet, Rfl: 0   Cholecalciferol  (VITAMIN D3) 50 MCG (2000 UT) TABS, Take 2,000 Units by mouth in the morning., Disp: , Rfl:    clotrimazole  (LOTRIMIN ) 1 % cream, Apply 1 Application topically 2 (two) times daily., Disp: , Rfl:    doxazosin  (CARDURA ) 2 MG tablet, Take 1  tablet (2 mg total) by mouth 2 (two) times daily. Please keep upcoming appt for future refills, Disp: 180 tablet, Rfl: 0   ELIQUIS  2.5 MG TABS tablet, Take 2.5 mg by mouth 2 (two) times daily., Disp: , Rfl:    ezetimibe  (ZETIA ) 10 MG tablet, TAKE 1 TABLET BY MOUTH DAILY, Disp: 90 tablet, Rfl: 0   finasteride  (PROSCAR ) 5 MG tablet, Take 1 tablet (5 mg total) by mouth daily., Disp: 90 tablet, Rfl: 1   HUMALOG KWIKPEN 100 UNIT/ML KwikPen, Inject 6 Units into the skin 3 (three) times daily with meals., Disp: , Rfl:    hydrALAZINE  (APRESOLINE ) 25 MG tablet, Take 1 tablet (25 mg total) by mouth 3 (three) times daily., Disp: 270 tablet, Rfl: 3   insulin  degludec (TRESIBA FLEXTOUCH) 100 UNIT/ML FlexTouch Pen, Inject into the skin daily. Patient takes 28-30 units 1 x per day. 115-125 takes 28 units, > 125 can take 29-30 units, Disp: , Rfl:    isosorbide  mononitrate (IMDUR ) 30 MG 24 hr tablet, Take 1 tablet (30 mg total) by mouth daily. Please call [phone] for a sooner appointment and for further refills., Disp: 30 tablet, Rfl: 0   loperamide  (IMODIUM  A-D) 2 MG tablet, Take 1 tablet (2 mg total) by mouth 3 (three) times daily as needed for diarrhea or loose stools., Disp: 30 tablet, Rfl: 0   meclizine  (ANTIVERT ) 12.5 MG tablet, Take 1 tablet (12.5 mg total) by mouth 3 (three) times daily as needed for dizziness., Disp: 30 tablet, Rfl: 0   Multiple Vitamins-Minerals (PRESERVISION AREDS 2 PO), Take 1 tablet by mouth in the morning and at bedtime., Disp: , Rfl:    omeprazole  (PRILOSEC) 20 MG capsule, TAKE 1 CAPSULE (20 MG) BY MOUTH TWICE DAILY, Disp: 180 capsule, Rfl: 1   potassium chloride  (KLOR-CON ) 10 MEQ tablet, Take 1 tablet (10 mEq total) by mouth daily as needed. Take with torsemide ,  Disp: 90 tablet, Rfl: 3   rosuvastatin  (CRESTOR ) 20 MG tablet, Take 20 mg by mouth daily., Disp: , Rfl:    torsemide  (DEMADEX ) 20 MG tablet, FOR WEIGHT 210-215 TAKE 1 TABLET WITH POTASSIUM  FOR WEIGHT GREATER THAN 215 TAKE  1 TABLET TWICE A DAY (8AM & 2PM) WITH 2 POTASSIUM, Disp: 200 tablet, Rfl: 1   traZODone  (DESYREL ) 50 MG tablet, Take 0.5-1 tablets (25-50 mg total) by mouth at bedtime as needed for sleep., Disp: 90 tablet, Rfl: 0   gabapentin  (NEURONTIN ) 100 MG capsule, TAKE 1 CAPSULE BY MOUTH TWO TIMES DAILY (Patient not taking: Reported on 12/12/2023), Disp: 60 capsule, Rfl: 2  Physical exam:  Vitals:   01/23/24 1413  BP: (!) 106/49  Pulse: 71  Resp: 19  Temp: 97.9 F (36.6 C)  TempSrc: Tympanic  SpO2: 98%  Weight: 215 lb (97.5 kg)  Height: 6' (1.829 m)   Physical Exam Cardiovascular:     Rate and Rhythm: Normal rate and regular rhythm.     Heart sounds: Normal heart sounds.  Pulmonary:     Effort: Pulmonary effort is normal.     Breath sounds: Normal breath sounds.  Skin:    General: Skin is warm and dry.  Neurological:     Mental Status: He is alert and oriented to person, place, and time.      I have personally reviewed labs listed below:    Latest Ref Rng & Units 12/12/2023    2:38 PM  CMP  Glucose 70 - 99 mg/dL 787   BUN 6 - 23 mg/dL 45   Creatinine 9.59 - 1.50 mg/dL 8.09   Sodium 864 - 854 mEq/L 137   Potassium 3.5 - 5.1 mEq/L 4.7   Chloride 96 - 112 mEq/L 105   CO2 19 - 32 mEq/L 24   Calcium  8.4 - 10.5 mg/dL 9.2   Total Protein 6.0 - 8.3 g/dL 6.5   Total Bilirubin 0.2 - 1.2 mg/dL 0.6   Alkaline Phos 39 - 117 U/L 96   AST 0 - 37 U/L 17   ALT 0 - 53 U/L 11       Latest Ref Rng & Units 01/05/2024    3:29 PM  CBC  WBC 4.0 - 10.5 K/uL 6.3   Hemoglobin 13.0 - 17.0 g/dL 89.0   Hematocrit 60.9 - 52.0 % 30.9   Platelets 150 - 400 K/uL 79      Assessment and plan- Patient is a 89 y.o. male referred for splenomegaly  Assessment and Plan    Splenomegaly Spleen size 14 cm, slightly enlarged. Cause unclear, possibly cirrhosis.  Flow cytometry negative.  No immediate intervention needed. - Order ultrasound in March to monitor spleen size and liver condition.  Anemia due  to iron and vitamin B12 deficiency Hemoglobin 10.9, low iron and B12 levels. Fatigue possibly related to anemia. Discussed IV iron infusions versus oral iron pills. B12 deficiency managed with oral supplementation. -Discussed recent limits of IV iron including all but not limited to possible risk of infusion anaphylactic reaction.  Patient understands and agrees to proceed as planned - Prescribed oral B12 supplement, 1000 mcg daily. - Order blood work in January to monitor iron and B12 levels.  Possible cirrhosis of liver Liver ultrasound showed uneven texture, suggestive of possible cirrhosis. No immediate intervention needed. - Monitor liver condition with ultrasound in March.         Visit Diagnosis 1. Iron deficiency anemia, unspecified iron deficiency anemia type  2. B12 deficiency   3. Splenomegaly      Dr. Annah Skene, MD, MPH [facility] [phone] 01/23/2024 2:32 PM

## 2024-01-23 NOTE — Patient Instructions (Signed)

## 2024-01-23 NOTE — Progress Notes (Signed)
 Patient doing okay; he has no new or acute concerns.

## 2024-01-24 NOTE — Telephone Encounter (Signed)
 open in error

## 2024-01-26 ENCOUNTER — Inpatient Hospital Stay

## 2024-01-26 VITALS — BP 122/51 | HR 67 | Temp 97.3°F | Resp 19

## 2024-01-26 DIAGNOSIS — D509 Iron deficiency anemia, unspecified: Secondary | ICD-10-CM

## 2024-01-26 MED ORDER — IRON SUCROSE 20 MG/ML IV SOLN
200.0000 mg | INTRAVENOUS | Status: DC
  Administered 2024-01-26: 200 mg via INTRAVENOUS
  Filled 2024-01-26: qty 10

## 2024-01-30 ENCOUNTER — Inpatient Hospital Stay

## 2024-01-30 VITALS — BP 119/49 | HR 67 | Temp 96.0°F | Resp 19

## 2024-01-30 DIAGNOSIS — D509 Iron deficiency anemia, unspecified: Secondary | ICD-10-CM

## 2024-01-30 MED ORDER — IRON SUCROSE 20 MG/ML IV SOLN
200.0000 mg | INTRAVENOUS | Status: DC
  Administered 2024-01-30: 200 mg via INTRAVENOUS
  Filled 2024-01-30: qty 10

## 2024-01-30 MED ORDER — SODIUM CHLORIDE 0.9% FLUSH
10.0000 mL | Freq: Once | INTRAVENOUS | Status: AC | PRN
  Administered 2024-01-30: 10 mL
  Filled 2024-01-30: qty 10

## 2024-02-06 ENCOUNTER — Inpatient Hospital Stay: Attending: Oncology

## 2024-02-06 VITALS — BP 109/62 | HR 68 | Temp 97.6°F | Resp 18

## 2024-02-06 DIAGNOSIS — D509 Iron deficiency anemia, unspecified: Secondary | ICD-10-CM | POA: Diagnosis present

## 2024-02-06 MED ORDER — IRON SUCROSE 20 MG/ML IV SOLN
200.0000 mg | INTRAVENOUS | Status: DC
  Administered 2024-02-06: 200 mg via INTRAVENOUS
  Filled 2024-02-06: qty 10

## 2024-02-06 NOTE — Patient Instructions (Signed)

## 2024-02-13 ENCOUNTER — Inpatient Hospital Stay

## 2024-02-13 VITALS — BP 131/80 | HR 58 | Temp 97.4°F | Resp 18

## 2024-02-13 DIAGNOSIS — D509 Iron deficiency anemia, unspecified: Secondary | ICD-10-CM

## 2024-02-13 MED ORDER — SODIUM CHLORIDE 0.9% FLUSH
10.0000 mL | Freq: Once | INTRAVENOUS | Status: AC | PRN
  Administered 2024-02-13: 10 mL
  Filled 2024-02-13: qty 10

## 2024-02-13 MED ORDER — IRON SUCROSE 20 MG/ML IV SOLN
200.0000 mg | INTRAVENOUS | Status: DC
  Administered 2024-02-13: 200 mg via INTRAVENOUS
  Filled 2024-02-13: qty 10

## 2024-02-16 ENCOUNTER — Other Ambulatory Visit: Payer: Self-pay | Admitting: *Deleted

## 2024-02-16 ENCOUNTER — Encounter: Payer: Self-pay | Admitting: *Deleted

## 2024-02-20 ENCOUNTER — Other Ambulatory Visit: Payer: Self-pay

## 2024-02-20 NOTE — Patient Instructions (Signed)
 Visit Information  Thank you for taking time to visit with me today. Please don't hesitate to contact me if I can be of assistance to you before our next scheduled appointment.  Your next care management appointment is by telephone on 03/18/24 at 1:00  Please call the care guide team at [phone] if you need to cancel, schedule, or reschedule an appointment.   Please call 911 if you are experiencing a Mental Health or Behavioral Health Crisis or need someone to talk to.  Josette Pellet, RN, BSN [place]  [facility] RN Care Manager Direct Dial : [phone]  Fax: [phone]

## 2024-02-20 NOTE — Patient Outreach (Signed)
 Complex Care Management   Visit Note  02/16/2024  Name:  [name] [name] MRN: [id number] DOB: Mar 26, 1933  Situation: Referral received for Complex Care Management related to Iron  Deficiency Anemia, diabetes, and Atrial Fibrillation. I obtained verbal consent from Patient.  Visit completed with Patient  on the phone  Background:   Past Medical History:  Diagnosis Date   (HFpEF) heart failure with preserved ejection fraction (HCC)    a. 06/2007 Echo: nl systolic fxn, mild LVH, diastolic relaxation abnormality, mildly enlarged LA, mild Ao insufficiency; 01/2018 Echo: EF 55-60%, no rwma, GR2 DD, triv AI, mildly dil LA, mild inc PASP.   AMS (altered mental status) 02/03/2021   BPH (benign prostatic hyperplasia)    Cancer (HCC)    skin   Cataract    CHF (congestive heart failure) (HCC)    takes water pills but does not know what for.   Choledocholithiasis    a. 05/2020 ERCP w/ CBD stone removal and biliary sphincterotomy.   Coronary artery disease    a. 01/2005 s/p CABG x 4: LIMA-LAD, VG-Diag, VG-OM3, VG-dRCA; b. 06/2013 Cath: patent grafts; c. 09/2015 Cath: VG->Diag 100, other grafts patent. LAD 100, RCA sev dzs->Med rx; d. 01/2018 Cath: LAD 100ost/p, LCX 50, RCA 100p/d, VG->OM3 100, VG->Diag 100, LIMA->LAD ok, VG->RPAV 67m-->Med rx.   COVID-19    07/2020 had paxlovid    Dyspnea    with exertion   Dysrhythmia    ED (erectile dysfunction)    Elevated LFTs    a. 05/2020 following CBD stone. 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   GERD (gastroesophageal reflux disease)    Hyperlipidemia    Hypertension    IDDM (insulin  dependent diabetes mellitus) 2000   Laceration of right hand without foreign body 02/07/2022   Osteoarthritis    PAH (pulmonary artery hypertension) (HCC)    a. 01/2018 RHC: PA 51/22 (32).   Perirectal fistula    Pneumonia 12/2016   Shingles 09/04/2018   Stroke (HCC) 2023   mild L hemiparesis   Tubular adenoma of colon  07/2012   Vertigo     Assessment: Patient Reported Symptoms:  Cognitive Cognitive Status: Alert and oriented to person, place, and time, Normal speech and language skills, No symptoms reported Cognitive/Intellectual Conditions Management [RPT]: None reported or documented in medical history or problem list   Health Maintenance Behaviors: Annual physical exam Healing Pattern: Unsure Health Facilitated by: Rest  Neurological Neurological Review of Symptoms: Not assessed    HEENT HEENT Symptoms Reported: No symptoms reported      Cardiovascular Cardiovascular Symptoms Reported: No symptoms reported Does patient have uncontrolled Hypertension?: No Cardiovascular Management Strategies: Medication therapy, Routine screening Cardiovascular Self-Management Outcome: 4 (good) Cardiovascular Comment: Patient has asymptomatic AFib. He was prescribed amiodarone  but after reviewing side effects has decided not to start it. He has discussed this with his cardiologist. He may consider starting it in the future. Encouraged to reach out to cardiologist with any new symptoms.  Respiratory Respiratory Symptoms Reported: No symptoms reported    Endocrine Endocrine Symptoms Reported: No symptoms reported Is patient diabetic?: Yes Is patient checking blood sugars at home?: Yes List most recent blood sugar readings, include date and time of day: 105 this morning Endocrine Self-Management Outcome: 4 (good) Endocrine Comment: managed by endocrinologist at [facility]. Last visit was 10/04/23. did not have a 3 month f/u visit. Encouarged to schedule. Next PCP visit is scheduled for 06/11/24  Gastrointestinal Gastrointestinal Symptoms Reported: Other  Gastrointestinal Management Strategies: Medication therapy (routine follow-up) Gastrointestinal Self-Management Outcome: 4 (good) Gastrointestinal Comment: Finishded iron  infusions. Scheduled for F/U liver imaging in 05/2024. Repeat labwork scheduled for 03/25/24  to monitor anemia and F/U with hematologist scheduled for 05/21/24.    Genitourinary Genitourinary Symptoms Reported: No symptoms reported    Integumentary Integumentary Symptoms Reported: No symptoms reported    Musculoskeletal Musculoskelatal Symptoms Reviewed: Not assessed        Psychosocial Psychosocial Symptoms Reported: No symptoms reported          02/20/2024    PHQ2-9 Depression Screening   Little interest or pleasure in doing things    Feeling down, depressed, or hopeless    PHQ-2 - Total Score    Trouble falling or staying asleep, or sleeping too much    Feeling tired or having little energy    Poor appetite or overeating     Feeling bad about yourself - or that you are a failure or have let yourself or your family down    Trouble concentrating on things, such as reading the newspaper or watching television    Moving or speaking so slowly that other people could have noticed.  Or the opposite - being so fidgety or restless that you have been moving around a lot more than usual    Thoughts that you would be better off dead, or hurting yourself in some way    PHQ2-9 Total Score    If you checked off any problems, how difficult have these problems made it for you to do your work, take care of things at home, or get along with other people    Depression Interventions/Treatment      There were no vitals filed for this visit.    Medications Reviewed Today     Reviewed by Charlsie Josette SAILOR, RN (Registered Nurse) on 02/19/24 at 1044  Med List Status: <None>   Medication Order Taking? Sig Documenting Provider Last Dose Status Informant  acetaminophen  (TYLENOL ) 325 MG tablet [id number] Yes Take by mouth. [provider]  Active   azelastine  (ASTELIN ) 0.1 % nasal spray [id number] Yes Place 2 sprays into both nostrils 2 (two) times daily. Use in each nostril as directed Maribeth Camellia MATSU, MD  Active            Med Note DORI, DAVINA E   Wed Apr 26, 2023 11:24 AM)  Patient takes differently.  Uses as needed  BD PEN NEEDLE NANO U/F 32G X 4 MM MISC [id number] Yes USE AS DIRECTED Maribeth Camellia MATSU, MD  Active   carvedilol  (COREG ) 12.5 MG tablet [id number] Yes TAKE ONE (1) TABLET BY MOUTH TWO TIMES PER DAY Gollan, Timothy J, MD  Active Self, Pharmacy Records  Cholecalciferol  (VITAMIN D3) 50 MCG (2000 UT) TABS [id number] Yes Take 2,000 Units by mouth in the morning. [provider]  Active Self, Pharmacy Records  clotrimazole  (LOTRIMIN ) 1 % cream [id number] Yes Apply 1 Application topically 2 (two) times daily. [provider]  Active Self           Med Note (LAWS, REGINA C   Mon Jul 24, 2023  1:17 PM) As needed  doxazosin  (CARDURA ) 2 MG tablet [id number] Yes Take 1 tablet (2 mg total) by mouth 2 (two) times daily. Please keep upcoming appt for future refills Gollan, Timothy J, MD  Active Self, Pharmacy Records  ELIQUIS  2.5 MG TABS tablet [id number] Yes Take 2.5 mg by mouth 2 (two) times daily. [provider]  Active   ezetimibe  (ZETIA ) 10 MG tablet [id number] Yes TAKE 1 TABLET BY MOUTH DAILY Gollan, Timothy J, MD  Active Self, Pharmacy Records  finasteride  (PROSCAR ) 5 MG tablet [id number] Yes Take 1 tablet (5 mg total) by mouth daily. Maribeth Camellia MATSU, MD  Active Self, Pharmacy Records  gabapentin  (NEURONTIN ) 100 MG capsule [id number]  TAKE 1 CAPSULE BY MOUTH TWO TIMES DAILY  Patient not taking: Reported on 02/19/2024   Marylynn Verneita CROME, MD  Active   HUMALOG KWIKPEN 100 UNIT/ML KwikPen [id number] Yes Inject 6 Units into the skin 3 (three) times daily with meals. [provider]  Active Self  hydrALAZINE  (APRESOLINE ) 25 MG tablet [id number] Yes Take 1 tablet (25 mg total) by mouth 3 (three) times daily. Maribeth Camellia MATSU, MD  Active Self, Pharmacy Records           Med Note DORI, [facility] E   Wed Apr 26, 2023 11:27 AM) Patient taking differently. Takes 1 pill 2 x per day.   insulin  degludec (TRESIBA FLEXTOUCH) 100 UNIT/ML FlexTouch Pen  [id number] Yes Inject into the skin daily. Patient takes 28-30 units 1 x per day. 115-125 takes 28 units, > 125 can take 29-30 units [provider]  Active            Med Note DORI, DAVINA E   Tue Jul 25, 2023 11:35 AM) Patient states he takes 24 units.   isosorbide  mononitrate (IMDUR ) 30 MG 24 hr tablet [id number] Yes Take 1 tablet (30 mg total) by mouth daily. Please call [phone] for a sooner appointment and for further refills. Vivienne Lonni Ingle, NP  Active Self, Pharmacy Records  loperamide  (IMODIUM  A-D) 2 MG tablet [id number] Yes Take 1 tablet (2 mg total) by mouth 3 (three) times daily as needed for diarrhea or loose stools. Antonetta Penne Ruth, PA  Active   meclizine  (ANTIVERT ) 12.5 MG tablet [id number] Yes Take 1 tablet (12.5 mg total) by mouth 3 (three) times daily as needed for dizziness. Elnor Lauraine BRAVO, NP  Active   Multiple Vitamins-Minerals (PRESERVISION AREDS 2 PO) [id number] Yes Take 1 tablet by mouth in the morning and at bedtime. [provider]  Active Self, Pharmacy Records  omeprazole  (PRILOSEC) 20 MG capsule [id number] Yes TAKE 1 CAPSULE (20 MG) BY MOUTH TWICE DAILY Marylynn Verneita CROME, MD  Active   potassium chloride  (KLOR-CON ) 10 MEQ tablet [id number] Yes Take 1 tablet (10 mEq total) by mouth daily as needed. Take with torsemide  Tullo, Teresa L, MD  Active   rosuvastatin  (CRESTOR ) 20 MG tablet [id number] Yes Take 20 mg by mouth daily. [provider]  Active   torsemide  (DEMADEX ) 20 MG tablet [id number] Yes FOR WEIGHT 210-215 TAKE 1 TABLET WITH POTASSIUM  FOR WEIGHT GREATER THAN 215 TAKE 1 TABLET TWICE A DAY (8AM & 2PM) WITH 2 POTASSIUM Maribeth Camellia MATSU, MD  Active   traZODone  (DESYREL ) 50 MG tablet [id number] Yes Take 0.5-1 tablets (25-50 mg total) by mouth at bedtime as needed for sleep. Marylynn Verneita CROME, MD  Active             Recommendation:   PCP Follow-up Specialty provider follow-up hematologist on 05/20/24 Continue Current Plan of  Care  Follow Up Plan:   Telephone follow-up in 1 month  Josette Pellet, RN, BSN [place]  [facility] RN Care Manager Direct Dial : [phone]  Fax: [phone]

## 2024-02-26 ENCOUNTER — Other Ambulatory Visit: Payer: Self-pay | Admitting: Internal Medicine

## 2024-03-18 ENCOUNTER — Telehealth: Payer: Self-pay | Admitting: *Deleted

## 2024-03-18 ENCOUNTER — Encounter: Payer: Self-pay | Admitting: *Deleted

## 2024-03-18 NOTE — Patient Instructions (Signed)
 Elsie CHRISTELLA Lesches - I am sorry I was unable to reach you today. I will give you a call again on 03/22/24 around 11:30. I look forward to speaking with you soon.   Thank you,   Josette Pellet, RN, BSN [place]  [facility] RN Care Manager Direct Dial : [phone]  Fax: [phone]

## 2024-03-22 ENCOUNTER — Ambulatory Visit: Payer: Self-pay

## 2024-03-22 ENCOUNTER — Encounter: Payer: Self-pay | Admitting: Emergency Medicine

## 2024-03-22 ENCOUNTER — Ambulatory Visit
Admission: EM | Admit: 2024-03-22 | Discharge: 2024-03-22 | Disposition: A | Discharge status: Home / Self Care | Attending: Emergency Medicine | Admitting: Emergency Medicine

## 2024-03-22 ENCOUNTER — Telehealth: Admitting: *Deleted

## 2024-03-22 DIAGNOSIS — R319 Hematuria, unspecified: Secondary | ICD-10-CM | POA: Insufficient documentation

## 2024-03-22 LAB — POCT URINE DIPSTICK
Glucose, UA: NEGATIVE mg/dL
Ketones, POC UA: NEGATIVE mg/dL
Leukocytes, UA: NEGATIVE
Nitrite, UA: NEGATIVE
POC PROTEIN,UA: >=300 — AB
Spec Grav, UA: >=1.03 — AB
Urobilinogen, UA: 1 U/dL
pH, UA: 5.5

## 2024-03-22 MED ORDER — NITROFURANTOIN MONOHYD MACRO 100 MG PO CAPS: 100.0000 mg | ORAL_CAPSULE | Freq: Two times a day (BID) | ORAL | 0 refills | Status: AC

## 2024-03-22 NOTE — Telephone Encounter (Signed)
 Message from [facility] sent at 03/22/2024  9:26 AM EST  Summary: Blood in urine   Reason for Triage: Blood in urine started yesterday         Reason for Disposition  Taking Coumadin (warfarin) or other strong blood thinner, or known bleeding disorder (e.g., thrombocytopenia)  Answer Assessment - Initial Assessment Questions Pt on blood thinner who started having bright red urine x 3 episodes yesterday as well as bilateral flank pain and lower back pain. Noted pen sized clots as well.  No openings with clinic advised to go to [facility]- pt stated will go to [facility]      1. COLOR of URINE: Describe the color of the urine.  (e.g., tea-colored, pink, red, bloody) Do you have blood clots in your urine? (e.g., none, pea, grape, small coin)     Red-yes to clots Pen head 2. ONSET: When did the bleeding start?      Yesterday  3. EPISODES: How many times has there been blood in the urine? or How many times today?     Three times yesterday /has not voided yet today  4. PAIN with URINATION: Is there any pain with passing your urine? If Yes, ask: How bad is the pain?  (Scale 1-10; or mild, moderate, severe)     no 5. FEVER: Do you have a fever? If Yes, ask: What is your temperature, how was it measured, and when did it start?     No  6. ASSOCIATED SYMPTOMS: Are you passing urine more frequently than usual?     no 7. OTHER SYMPTOMS: Do you have any other symptoms? (e.g., back/flank pain, abdomen pain, vomiting)     Back and flank pain (both sides) x several days  Protocols used: Urine - Blood In-A-AH

## 2024-03-22 NOTE — ED Provider Notes (Signed)
 " CAY RALPH PELT    CSN: [id number] Arrival date & time: 03/22/24  1125      History   Chief Complaint Chief Complaint  Patient presents with   Hematuria    HPI [name] [name] is a 89 y.o. male.   Patient presents for evaluation of bilateral low back pain present for 6 days, experiencing hematuria beginning yesterday evening.  Difficult to determine if having frequency as he takes daily furosemide .  Denies urgency dysuria, abdominal pain or fever.  Past Medical History:  Diagnosis Date   (HFpEF) heart failure with preserved ejection fraction (HCC)    a. 06/2007 Echo: nl systolic fxn, mild LVH, diastolic relaxation abnormality, mildly enlarged LA, mild Ao insufficiency; 01/2018 Echo: EF 55-60%, no rwma, GR2 DD, triv AI, mildly dil LA, mild inc PASP.   AMS (altered mental status) 02/03/2021   BPH (benign prostatic hyperplasia)    Cancer (HCC)    skin   Cataract    CHF (congestive heart failure) (HCC)    takes water pills but does not know what for.   Choledocholithiasis    a. 05/2020 ERCP w/ CBD stone removal and biliary sphincterotomy.   Coronary artery disease    a. 01/2005 s/p CABG x 4: LIMA-LAD, VG-Diag, VG-OM3, VG-dRCA; b. 06/2013 Cath: patent grafts; c. 09/2015 Cath: VG->Diag 100, other grafts patent. LAD 100, RCA sev dzs->Med rx; d. 01/2018 Cath: LAD 100ost/p, LCX 50, RCA 100p/d, VG->OM3 100, VG->Diag 100, LIMA->LAD ok, VG->RPAV 30m-->Med rx.   COVID-19    07/2020 had paxlovid    Dyspnea    with exertion   Dysrhythmia    ED (erectile dysfunction)    Elevated LFTs    a. 05/2020 following CBD stone. 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   GERD (gastroesophageal reflux disease)    Hyperlipidemia    Hypertension    IDDM (insulin  dependent diabetes mellitus) 2000   Laceration of right hand without foreign body 02/07/2022   Osteoarthritis    PAH (pulmonary artery hypertension) (HCC)    a. 01/2018 RHC: PA 51/22 (32).    Perirectal fistula    Pneumonia 12/2016   Shingles 09/04/2018   Stroke (HCC) 2023   mild L hemiparesis   Tubular adenoma of colon 07/2012   Vertigo     Patient Active Problem List   Diagnosis Date Noted   Iron  deficiency anemia 01/23/2024   Cirrhosis of liver (HCC) 12/26/2023   Splenomegaly 12/24/2023   Benign hypertensive heart and kidney disease with diastolic CHF, NYHA class 1 and CKD stage 3 (HCC) 12/13/2023   Exudative age-related macular degeneration, bilateral, with inactive choroidal neovascularization (HCC) 12/13/2023   Chronic kidney disease, stage 3b (HCC) 12/13/2023   Venous insufficiency of both lower extremities 12/13/2023   Chronic sphenoidal sinusitis 12/12/2023   Orthostatic hypotension 11/17/2023   Chronic left shoulder pain 05/02/2023   Allergic rhinitis 05/18/2022   Anxiety 04/01/2022   Type 2 diabetes mellitus with complication, with long-term current use of insulin  (HCC) 04/01/2022   Gastroenteritis 03/23/2022   Nausea and vomiting in adult 03/14/2022   History of CVA (cerebrovascular accident) 01/26/2022   Atrial fibrillation, chronic (HCC) 01/26/2022   Atrial flutter (HCC) 11/05/2021   Chronic anticoagulation 11/05/2021   De Quervain's tenosynovitis, left 10/05/2020   Abnormal MRI, lumbar spine 08/12/2020   History of COVID-19 07/22/2020   Scrotal lesion 03/12/2020   Cellulitis 05/12/2019   Postherpetic neuralgia 05/09/2019   B12 deficiency 05/09/2019   Pulmonary  hypertension, unspecified (HCC) 04/05/2019   Atypical chest pain 01/08/2019   Vertigo 10/15/2018   Diabetic retinopathy (HCC) 09/20/2018   Chronic low back pain 03/05/2018   Breast tenderness in male 01/18/2018   PAC (premature atrial contraction) 01/18/2018   Neuropathic pain 01/18/2018   Bell's palsy 08/18/2017   H/O cold sores 08/18/2017   Insomnia 11/10/2016   PLMD (periodic limb movement disorder) 08/17/2016   Mild OSA 08/17/2016   Intolerance of continuous positive airway  pressure (CPAP) ventilation 08/17/2016   Chronic arthritis 06/02/2016   CKD stage 3 due to type 2 diabetes mellitus (HCC) 06/02/2016   Diastolic dysfunction 05/04/2016   Chronic pain of both knees 10/06/2015   Spondylosis of lumbar region without myelopathy or radiculopathy 10/06/2015   S/P CABG x 4    Thrombocytopenia 02/07/2015   DM2 (diabetes mellitus, type 2) (HCC) 02/06/2015   Chronic diastolic CHF (congestive heart failure) (HCC) 01/01/2014   CAD (coronary artery disease) 06/03/2013   Diabetic neuropathy (HCC) 11/12/2012   BPH (benign prostatic hyperplasia)    GERD (gastroesophageal reflux disease)    Hyperlipidemia 09/15/2009   Essential hypertension 09/15/2009    Past Surgical History:  Procedure Laterality Date   BACK SURGERY     CARDIAC CATHETERIZATION  06/05/2013   [facility]    CARDIAC CATHETERIZATION N/A 09/24/2015   Procedure: Right Heart Cath and Coronary/Graft Angiography;  Surgeon: Evalene JINNY Lunger, MD;  Location: [facility];  Service: Cardiovascular;  Laterality: N/A;   CATARACT EXTRACTION  sept 2013   right   CATARACT EXTRACTION W/PHACO Left 03/28/2017   Procedure: CATARACT EXTRACTION PHACO AND INTRAOCULAR LENS PLACEMENT (IOC);  Surgeon: Jaye Fallow, MD;  Location: [facility] ORS;  Service: Ophthalmology;  Laterality: Left;  US  00:42.0 AP% 15.0 CDE 6.31 Fluid Pack lot # [id number]   CHOLECYSTECTOMY  05/15/2011   Procedure: LAPAROSCOPIC CHOLECYSTECTOMY;  Surgeon: Donnice Bury, MD;  Location: [facility] OR;  Service: General;  Laterality: N/A;   COLONOSCOPY W/ POLYPECTOMY     CORONARY ARTERY BYPASS GRAFT  2007   x 4   CORONARY STENT INTERVENTION N/A 12/08/2020   Procedure: CORONARY STENT INTERVENTION;  Surgeon: Mady Bruckner, MD;  Location: [facility];  Service: Cardiovascular;  Laterality: N/A;   ERCP N/A 05/05/2020   Procedure: ENDOSCOPIC RETROGRADE CHOLANGIOPANCREATOGRAPHY (ERCP);  Surgeon: Jinny Carmine, MD;  Location: [facility] ENDOSCOPY;  Service:  Endoscopy;  Laterality: N/A;   ERCP N/A 05/12/2020   Procedure: ENDOSCOPIC RETROGRADE CHOLANGIOPANCREATOGRAPHY (ERCP);  Surgeon: Jinny Carmine, MD;  Location: [facility] ENDOSCOPY;  Service: Endoscopy;  Laterality: N/A;   ESOPHAGOGASTRODUODENOSCOPY N/A 05/12/2020   Procedure: ESOPHAGOGASTRODUODENOSCOPY (EGD);  Surgeon: Jinny Carmine, MD;  Location: [facility] ENDOSCOPY;  Service: Endoscopy;  Laterality: N/A;   EYE SURGERY     FOOT SURGERY  2012   right foot   JOINT REPLACEMENT  8/10   Right THR--[name]   LEFT AND RIGHT HEART CATHETERIZATION WITH CORONARY/GRAFT ANGIOGRAM N/A 06/05/2013   Procedure: LEFT AND RIGHT HEART CATHETERIZATION WITH EL BILE;  Surgeon: Peter M Jordan, MD;  Location: [facility] CATH LAB;  Service: Cardiovascular;  Laterality: N/A;   LUMBAR LAMINECTOMY  1989   Prostate photovaporization  5/16   Dr Jere   RIGHT/LEFT HEART CATH AND CORONARY/GRAFT ANGIOGRAPHY N/A 01/05/2018   Procedure: RIGHT/LEFT HEART CATH AND CORONARY/GRAFT ANGIOGRAPHY;  Surgeon: Lunger Evalene JINNY, MD;  Location: [facility];  Service: Cardiovascular;  Laterality: N/A;   RIGHT/LEFT HEART CATH AND CORONARY/GRAFT ANGIOGRAPHY Bilateral 12/08/2020   Procedure: RIGHT/LEFT HEART CATH AND CORONARY/GRAFT  ANGIOGRAPHY;  Surgeon: Mady Bruckner, MD;  Location: [facility];  Service: Cardiovascular;  Laterality: Bilateral;       Home Medications    Prior to Admission medications  Medication Sig Start Date End Date Taking? Authorizing Provider  acetaminophen  (TYLENOL ) 325 MG tablet Take by mouth.    [provider]  azelastine  (ASTELIN ) 0.1 % nasal spray Place 2 sprays into both nostrils 2 (two) times daily. Use in each nostril as directed 05/18/22   Maribeth Camellia MATSU, MD  BD PEN NEEDLE NANO U/F 32G X 4 MM MISC USE AS DIRECTED 06/02/22   Maribeth Camellia MATSU, MD  carvedilol  (COREG ) 12.5 MG tablet TAKE ONE (1) TABLET BY MOUTH TWO TIMES PER DAY 02/10/22   Gollan, Timothy J, MD   Cholecalciferol  (VITAMIN D3) 50 MCG (2000 UT) TABS Take 2,000 Units by mouth in the morning.    [provider]  clotrimazole  (LOTRIMIN ) 1 % cream Apply 1 Application topically 2 (two) times daily.    [provider]  doxazosin  (CARDURA ) 2 MG tablet Take 1 tablet (2 mg total) by mouth 2 (two) times daily. Please keep upcoming appt for future refills 11/18/21   Gollan, Timothy J, MD  ELIQUIS  2.5 MG TABS tablet Take 2.5 mg by mouth 2 (two) times daily.    [provider]  ezetimibe  (ZETIA ) 10 MG tablet TAKE 1 TABLET BY MOUTH DAILY 11/04/21   Gollan, Timothy J, MD  finasteride  (PROSCAR ) 5 MG tablet Take 1 tablet (5 mg total) by mouth daily. 03/11/20   Maribeth Camellia MATSU, MD  gabapentin  (NEURONTIN ) 100 MG capsule TAKE 1 CAPSULE BY MOUTH TWO TIMES DAILY Patient not taking: Reported on 02/19/2024 09/07/23   Marylynn Verneita CROME, MD  HUMALOG KWIKPEN 100 UNIT/ML KwikPen Inject 6 Units into the skin 3 (three) times daily with meals.    [provider]  hydrALAZINE  (APRESOLINE ) 25 MG tablet Take 1 tablet (25 mg total) by mouth 3 (three) times daily. 10/15/21   Maribeth Camellia MATSU, MD  insulin  degludec (TRESIBA FLEXTOUCH) 100 UNIT/ML FlexTouch Pen Inject into the skin daily. Patient takes 28-30 units 1 x per day. 115-125 takes 28 units, > 125 can take 29-30 units    [provider]  isosorbide  mononitrate (IMDUR ) 30 MG 24 hr tablet Take 1 tablet (30 mg total) by mouth daily. Please call [phone] for a sooner appointment and for further refills. 12/15/21   Vivienne Bruckner Ingle, NP  loperamide  (IMODIUM  A-D) 2 MG tablet Take 1 tablet (2 mg total) by mouth 3 (three) times daily as needed for diarrhea or loose stools. 03/22/22   Antonetta Penne Ruth, PA  meclizine  (ANTIVERT ) 12.5 MG tablet Take 1 tablet (12.5 mg total) by mouth 3 (three) times daily as needed for dizziness. 11/17/23   Elnor Lauraine BRAVO, NP  Multiple Vitamins-Minerals (PRESERVISION AREDS 2 PO) Take 1 tablet by mouth  in the morning and at bedtime.    [provider]  omeprazole  (PRILOSEC) 20 MG capsule TAKE 1 CAPSULE (20 MG) BY MOUTH TWICE DAILY 01/18/24   Marylynn Verneita CROME, MD  potassium chloride  (KLOR-CON ) 10 MEQ tablet Take 1 tablet (10 mEq total) by mouth daily as needed. Take with torsemide  01/09/24   Marylynn Verneita CROME, MD  rosuvastatin  (CRESTOR ) 20 MG tablet Take 20 mg by mouth daily. 02/19/23   [provider]  torsemide  (DEMADEX ) 20 MG tablet FOR WEIGHT 210-215 TAKE 1 TABLET WITH POTASSIUM  FOR WEIGHT GREATER THAN 215 TAKE 1 TABLET TWICE A  DAY (8AM & 2PM) WITH 2 POTASSIUM 01/19/23   Maribeth Camellia MATSU, MD  traZODone  (DESYREL ) 50 MG tablet TAKE 1/2-1 TABLET BY MOUTH AT BEDTIME ASNEEDED FOR SLEEP 02/26/24   Marylynn Verneita CROME, MD    Family History Family History  Problem Relation Age of Onset   Lung cancer Mother    Heart disease Father    Colon cancer Neg Hx    Breast cancer Neg Hx     Social History Social History[1]   Allergies   Cortisone, Metformin , and Semaglutide   Review of Systems Review of Systems  Genitourinary:  Positive for flank pain and hematuria. Negative for decreased urine volume, difficulty urinating, dysuria, enuresis, frequency, genital sores, penile discharge, penile pain, penile swelling, scrotal swelling, testicular pain and urgency.     Physical Exam Triage Vital Signs ED Triage Vitals  Encounter Vitals Group     BP 03/22/24 1218 122/65     Girls Systolic BP Percentile --      Girls Diastolic BP Percentile --      Boys Systolic BP Percentile --      Boys Diastolic BP Percentile --      Pulse Rate 03/22/24 1218 70     Resp 03/22/24 1218 16     Temp 03/22/24 1218 (!) 97.4 F (36.3 C)     Temp Source 03/22/24 1218 Oral     SpO2 03/22/24 1218 96 %     Weight 03/22/24 1212 206 lb (93.4 kg)     Height 03/22/24 1212 6' (1.829 m)     Head Circumference --      Peak Flow --      Pain Score 03/22/24 1212 0     Pain Loc --      Pain Education --       Exclude from Growth Chart --    No data found.  Updated Vital Signs BP 122/65   Pulse 70   Temp (!) 97.4 F (36.3 C) (Oral)   Resp 16   Ht 6' (1.829 m)   Wt 206 lb (93.4 kg)   SpO2 96%   BMI 27.94 kg/m   Visual Acuity Right Eye Distance:   Left Eye Distance:   Bilateral Distance:    Right Eye Near:   Left Eye Near:    Bilateral Near:     Physical Exam Constitutional:      Appearance: Normal appearance.  Eyes:     Extraocular Movements: Extraocular movements intact.  Pulmonary:     Effort: Pulmonary effort is normal.  Abdominal:     Tenderness: There is no abdominal tenderness. There is no right CVA tenderness, left CVA tenderness or guarding.  Neurological:     Mental Status: He is alert and oriented to person, place, and time. Mental status is at baseline.      UC Treatments / Results  Labs (all labs ordered are listed, but only abnormal results are displayed) Labs Reviewed  POCT URINE DIPSTICK - Abnormal; Notable for the following components:      Result Value   Color, UA red (*)    Clarity, UA cloudy (*)    Bilirubin, UA small (*)    Spec Grav, UA >=1.030 (*)    Blood, UA large (*)    POC PROTEIN,UA >=300 (*)    All other components within normal limits  URINE CULTURE    EKG   Radiology No results found.  Procedures Procedures (including critical care time)  Medications Ordered in UC  Medications - No data to display  Initial Impression / Assessment and Plan / UC Course  I have reviewed the triage vital signs and the nursing notes.  Pertinent labs & imaging results that were available during my care of the patient were reviewed by me and considered in my medical decision making (see chart for details).  Hematuria  Urinalysis showing presence of blood, negative for leukocytes or nitrates, sent for culture, discussed findings with patient and spouse, possible urinary infection however alternative etiology possibly kidney stone,  discussed, empirically placed on Macrobid , has upcoming nephrology appointment in 2 days advised to keep, recommended nonpharmacological supportive care over-the-counter medications with follow-up as needed, given strict ER precaution Final Clinical Impressions(s) / UC Diagnoses   Final diagnoses:  Hematuria, unspecified type   Discharge Instructions   None    ED Prescriptions   None    PDMP not reviewed this encounter.     [1]  Social History Tobacco Use   Smoking status: Never   Smokeless tobacco: Never  Vaping Use   Vaping status: Never Used  Substance Use Topics   Alcohol use: Yes    Comment: occ cocktail   Drug use: No     Teresa Shelba SAUNDERS, NP 03/22/24 1304  "

## 2024-03-22 NOTE — ED Triage Notes (Signed)
 Patient in office with wife complaint of blood in urine since yesterday. Urine is brownish color wife brought in a jar to show us .  Lower back pain x6d.  OTC: none  Denies: fever, N/V and diarrhea

## 2024-03-22 NOTE — Discharge Instructions (Addendum)
 Urinalysis shows [name] [name] blood cells but at this time does not show signs of infection and therefore it has been sent to the lab for 3 days to determine bacteria is truly present, if we see bacterial growth and need to change your medicine you will be notified, if no bacterial growth is seen you will be notified to stop the antibiotic  An alternative cause of your symptoms would be a kidney stone, imaging for kidney stone is a CT which is unavailable here in urgent care please keep upcoming appointment with nephrologist  Begin use of Macrobid  twice daily for 7 days as you are symptomatic and this will begin treatment of a urinary infection if it is truly present  Increase your fluid intake through use of water as this helps to flush out the kidney and the bladder  For pain you may take Tylenol  as needed  At any point if you begin to have severe abdominal or back pain or increasingly large amounts of blood worsening from what is currently present, persistent vomiting or fever please go to the nearest emergency department for immediate evaluation

## 2024-03-23 LAB — URINE CULTURE: Culture: NO GROWTH

## 2024-03-25 ENCOUNTER — Ambulatory Visit (HOSPITAL_COMMUNITY): Payer: Self-pay

## 2024-03-25 ENCOUNTER — Inpatient Hospital Stay: Attending: Oncology

## 2024-03-25 DIAGNOSIS — D509 Iron deficiency anemia, unspecified: Secondary | ICD-10-CM

## 2024-03-25 LAB — CBC (CANCER CENTER ONLY)
HCT: 32.4 % — ABNORMAL LOW (ref 39.0–52.0)
Hemoglobin: 11.5 g/dL — ABNORMAL LOW (ref 13.0–17.0)
MCH: 33.8 pg (ref 26.0–34.0)
MCHC: 35.5 g/dL (ref 30.0–36.0)
MCV: 95.3 fL (ref 80.0–100.0)
Platelet Count: 73 K/uL — ABNORMAL LOW (ref 150–400)
RBC: 3.4 MIL/uL — ABNORMAL LOW (ref 4.22–5.81)
RDW: 12.1 % (ref 11.5–15.5)
WBC Count: 6 K/uL (ref 4.0–10.5)
nRBC: 0 % (ref 0.0–0.2)

## 2024-03-25 LAB — FERRITIN: Ferritin: 165 ng/mL (ref 24–336)

## 2024-03-25 LAB — IRON AND TIBC
Iron: 109 ug/dL (ref 45–182)
Saturation Ratios: 44 % — ABNORMAL HIGH (ref 17.9–39.5)
TIBC: 251 ug/dL (ref 250–450)
UIBC: 142 ug/dL

## 2024-03-26 ENCOUNTER — Telehealth: Payer: Self-pay | Admitting: Internal Medicine

## 2024-03-26 NOTE — Telephone Encounter (Signed)
 Pt dropped off a chronic condition verification form. It's in Dr Lula color folder up front

## 2024-03-26 NOTE — Telephone Encounter (Signed)
 Form completed and faxed back to insurance company.  Used number provided on form.  Called pt and let him know that this was completed.  Pt verbalized thanks.

## 2024-03-28 NOTE — Progress Notes (Unsigned)
 "   03/28/2024 [name] [name] [id number] March 19, 1933  Gastroenterology Office Note    Referring Provider: Marylynn Verneita CROME, MD Primary Care Physician:  Marylynn Verneita CROME, MD  Primary GI Provider: Celestia Rima, NP; Jinny Carmine, MD    Chief Complaint   No chief complaint on file.    History of Present Illness   [name] [name] is a 89 y.o. male with PMHX of iron  deficiency anemia, B12 deficiency, splenomegaly presenting today at the request of Marylynn Verneita CROME, MD due to    12/19/2023 Abdominal ultrasound complete IMPRESSION: 1. Splenomegaly. 2. Lobular liver contours. This is nonspecific but can be seen in the setting of cirrhosis. 3. Mildly increased renal echogenicity with mild cortical thinning. This is nonspecific but can be seen in the setting of medical renal disease.   Past Medical History:  Diagnosis Date   (HFpEF) heart failure with preserved ejection fraction (HCC)    a. 06/2007 Echo: nl systolic fxn, mild LVH, diastolic relaxation abnormality, mildly enlarged LA, mild Ao insufficiency; 01/2018 Echo: EF 55-60%, no rwma, GR2 DD, triv AI, mildly dil LA, mild inc PASP.   AMS (altered mental status) 02/03/2021   BPH (benign prostatic hyperplasia)    Cancer (HCC)    skin   Cataract    CHF (congestive heart failure) (HCC)    takes water pills but does not know what for.   Choledocholithiasis    a. 05/2020 ERCP w/ CBD stone removal and biliary sphincterotomy.   Coronary artery disease    a. 01/2005 s/p CABG x 4: LIMA-LAD, VG-Diag, VG-OM3, VG-dRCA; b. 06/2013 Cath: patent grafts; c. 09/2015 Cath: VG->Diag 100, other grafts patent. LAD 100, RCA sev dzs->Med rx; d. 01/2018 Cath: LAD 100ost/p, LCX 50, RCA 100p/d, VG->OM3 100, VG->Diag 100, LIMA->LAD ok, VG->RPAV 40m-->Med rx.   COVID-19    07/2020 had paxlovid    Dyspnea    with exertion   Dysrhythmia    ED (erectile dysfunction)    Elevated LFTs    a. 05/2020 following CBD stone. Liver biopsy consistent w/ changes  related to CBD obstruction and not chronic process.   GERD (gastroesophageal reflux disease)    Hyperlipidemia    Hypertension    IDDM (insulin  dependent diabetes mellitus) 2000   Laceration of right hand without foreign body 02/07/2022   Osteoarthritis    PAH (pulmonary artery hypertension) (HCC)    a. 01/2018 RHC: PA 51/22 (32).   Perirectal fistula    Pneumonia 12/2016   Shingles 09/04/2018   Stroke (HCC) 2023   mild L hemiparesis   Tubular adenoma of colon 07/2012   Vertigo     Past Surgical History:  Procedure Laterality Date   BACK SURGERY     CARDIAC CATHETERIZATION  06/05/2013   [facility]    CARDIAC CATHETERIZATION N/A 09/24/2015   Procedure: Right Heart Cath and Coronary/Graft Angiography;  Surgeon: Evalene JINNY Lunger, MD;  Location: [facility] INVASIVE CV LAB;  Service: Cardiovascular;  Laterality: N/A;   CATARACT EXTRACTION  sept 2013   right   CATARACT EXTRACTION W/PHACO Left 03/28/2017   Procedure: CATARACT EXTRACTION PHACO AND INTRAOCULAR LENS PLACEMENT (IOC);  Surgeon: Jaye Elsie, MD;  Location: [facility] ORS;  Service: Ophthalmology;  Laterality: Left;  US  00:42.0 AP% 15.0 CDE 6.31 Fluid Pack lot # [id number]   CHOLECYSTECTOMY  05/15/2011   Procedure: LAPAROSCOPIC CHOLECYSTECTOMY;  Surgeon: Donnice Bury, MD;  Location: [facility] OR;  Service: General;  Laterality: N/A;   COLONOSCOPY W/ POLYPECTOMY     CORONARY  ARTERY BYPASS GRAFT  2007   x 4   CORONARY STENT INTERVENTION N/A 12/08/2020   Procedure: CORONARY STENT INTERVENTION;  Surgeon: Mady Bruckner, MD;  Location: [facility]IVE CV LAB;  Service: Cardiovascular;  Laterality: N/A;   ERCP N/A 05/05/2020   Procedure: ENDOSCOPIC RETROGRADE CHOLANGIOPANCREATOGRAPHY (ERCP);  Surgeon: Jinny Carmine, MD;  Location: [facility] ENDOSCOPY;  Service: Endoscopy;  Laterality: N/A;   ERCP N/A 05/12/2020   Procedure: ENDOSCOPIC RETROGRADE CHOLANGIOPANCREATOGRAPHY (ERCP);  Surgeon: Jinny Carmine, MD;  Location: [facility] ENDOSCOPY;  Service: Endoscopy;   Laterality: N/A;   ESOPHAGOGASTRODUODENOSCOPY N/A 05/12/2020   Procedure: ESOPHAGOGASTRODUODENOSCOPY (EGD);  Surgeon: Jinny Carmine, MD;  Location[facility] ENDOSCOPY;  Service: Endoscopy;  Laterality: N/A;   EYE SURGERY     FOOT SURGERY  2012   right foot   JOINT REPLACEMENT  8/10   Right THR--[name]   LEFT AND RIGHT HEART CATHETERIZATION WITH CORONARY/GRAFT ANGIOGRAM N/A 06/05/2013   Procedure: LEFT AND RIGHT HEART CATHETERIZATION WITH EL BILE;  Surgeon: Peter M Jordan, MD;  Location: [facility] CATH LAB;  Service: Cardiovascular;  Laterality: N/A;   LUMBAR LAMINECTOMY  1989   Prostate photovaporization  5/16   Dr Jere   RIGHT/LEFT HEART CATH AND CORONARY/GRAFT ANGIOGRAPHY N/A 01/05/2018   Procedure: RIGHT/LEFT HEART CATH AND CORONARY/GRAFT ANGIOGRAPHY;  Surgeon: Perla Evalene PARAS, MD;  Location: [facility] INVASIVE CV LAB;  Service: Cardiovascular;  Laterality: N/A;   RIGHT/LEFT HEART CATH AND CORONARY/GRAFT ANGIOGRAPHY Bilateral 12/08/2020   Procedure: RIGHT/LEFT HEART CATH AND CORONARY/GRAFT ANGIOGRAPHY;  Surgeon: Mady Bruckner, MD;  Location: [facility] INVASIVE CV LAB;  Service: Cardiovascular;  Laterality: Bilateral;    Current Outpatient Medications  Medication Sig Dispense Refill   acetaminophen  (TYLENOL ) 325 MG tablet Take by mouth.     azelastine  (ASTELIN ) 0.1 % nasal spray Place 2 sprays into both nostrils 2 (two) times daily. Use in each nostril as directed 30 mL 2   BD PEN NEEDLE NANO U/F 32G X 4 MM MISC USE AS DIRECTED 100 each 0   carvedilol  (COREG ) 12.5 MG tablet TAKE ONE (1) TABLET BY MOUTH TWO TIMES PER DAY 60 tablet 0   Cholecalciferol  (VITAMIN D3) 50 MCG (2000 UT) TABS Take 2,000 Units by mouth in the morning.     clotrimazole  (LOTRIMIN ) 1 % cream Apply 1 Application topically 2 (two) times daily.     doxazosin  (CARDURA ) 2 MG tablet Take 1 tablet (2 mg total) by mouth 2 (two) times daily. Please keep upcoming appt for future refills 180 tablet 0   ELIQUIS  2.5 MG TABS  tablet Take 2.5 mg by mouth 2 (two) times daily.     ezetimibe  (ZETIA ) 10 MG tablet TAKE 1 TABLET BY MOUTH DAILY 90 tablet 0   finasteride  (PROSCAR ) 5 MG tablet Take 1 tablet (5 mg total) by mouth daily. 90 tablet 1   gabapentin  (NEURONTIN ) 100 MG capsule TAKE 1 CAPSULE BY MOUTH TWO TIMES DAILY (Patient not taking: Reported on 02/19/2024) 60 capsule 2   HUMALOG KWIKPEN 100 UNIT/ML KwikPen Inject 6 Units into the skin 3 (three) times daily with meals.     hydrALAZINE  (APRESOLINE ) 25 MG tablet Take 1 tablet (25 mg total) by mouth 3 (three) times daily. 270 tablet 3   insulin  degludec (TRESIBA FLEXTOUCH) 100 UNIT/ML FlexTouch Pen Inject into the skin daily. Patient takes 28-30 units 1 x per day. 115-125 takes 28 units, > 125 can take 29-30 units     isosorbide  mononitrate (IMDUR ) 30 MG 24 hr tablet Take 1 tablet (30 mg total)  by mouth daily. Please call [phone] for a sooner appointment and for further refills. 30 tablet 0   loperamide  (IMODIUM  A-D) 2 MG tablet Take 1 tablet (2 mg total) by mouth 3 (three) times daily as needed for diarrhea or loose stools. 30 tablet 0   meclizine  (ANTIVERT ) 12.5 MG tablet Take 1 tablet (12.5 mg total) by mouth 3 (three) times daily as needed for dizziness. 30 tablet 0   Multiple Vitamins-Minerals (PRESERVISION AREDS 2 PO) Take 1 tablet by mouth in the morning and at bedtime.     nitrofurantoin , macrocrystal-monohydrate, (MACROBID ) 100 MG capsule Take 1 capsule (100 mg total) by mouth 2 (two) times daily for 7 days. 14 capsule 0   omeprazole  (PRILOSEC) 20 MG capsule TAKE 1 CAPSULE (20 MG) BY MOUTH TWICE DAILY 180 capsule 1   potassium chloride  (KLOR-CON ) 10 MEQ tablet Take 1 tablet (10 mEq total) by mouth daily as needed. Take with torsemide  90 tablet 3   rosuvastatin  (CRESTOR ) 20 MG tablet Take 20 mg by mouth daily.     torsemide  (DEMADEX ) 20 MG tablet FOR WEIGHT 210-215 TAKE 1 TABLET WITH POTASSIUM  FOR WEIGHT GREATER THAN 215 TAKE 1 TABLET TWICE A DAY (8AM & 2PM)  WITH 2 POTASSIUM 200 tablet 1   traZODone  (DESYREL ) 50 MG tablet TAKE 1/2-1 TABLET BY MOUTH AT BEDTIME ASNEEDED FOR SLEEP 90 tablet 1   No current facility-administered medications for this visit.    Allergies as of 04/01/2024 - Review Complete 03/22/2024  Allergen Reaction Noted   Cortisone Other (See Comments) 06/24/2016   Metformin  Diarrhea 09/05/2016   Semaglutide Nausea Only and Other (See Comments) 10/19/2022    Family History  Problem Relation Age of Onset   Lung cancer Mother    Heart disease Father    Colon cancer Neg Hx    Breast cancer Neg Hx     Social History   Socioeconomic History   Marital status: Married    Spouse name: Jan   Number of children: 4   Years of education: Not on file   Highest education level: Not on file  Occupational History   Occupation: Warden/ranger    Comment: animator  Tobacco Use   Smoking status: Never   Smokeless tobacco: Never  Vaping Use   Vaping status: Never Used  Substance and Sexual Activity   Alcohol use: Yes    Comment: occ cocktail   Drug use: No   Sexual activity: Not Currently  Other Topics Concern   Not on file  Social History Narrative   Goes by [place]   Has living will    Has designated [employer] as health care POA   Would accept resuscitation attempts   Would not want tube feeds if cognitively unaware   Social Drivers of Health   Tobacco Use: Low Risk (03/22/2024)   Patient History    Smoking Tobacco Use: Never    Smokeless Tobacco Use: Never    Passive Exposure: Not on file  Recent Concern: Tobacco Use - Medium Risk (03/13/2024)   Received from [facility]   Patient History    Smoking Tobacco Use: Former    Smokeless Tobacco Use: Never    Passive Exposure: Past  Physicist, Medical Strain: Low Risk (02/16/2024)   Overall Financial Resource Strain (CARDIA)    Difficulty of Paying Living Expenses: Not hard at all  Food Insecurity: No Food  Insecurity (02/16/2024)   Epic    Worried About Radiation Protection Practitioner of [employer]  in the Last Year: Never true    Ran Out of Food in the Last Year: Never true  Transportation Needs: No Transportation Needs (02/16/2024)   Epic    Lack of Transportation (Medical): No    Lack of Transportation (Non-Medical): No  Physical Activity: Inactive (07/24/2023)   Exercise Vital Sign    Days of Exercise per Week: 0 days    Minutes of Exercise per Session: 0 min  Stress: Stress Concern Present (07/24/2023)   [employer] of Occupational Health - Occupational Stress Questionnaire    Feeling of Stress : Very much  Social Connections: Moderately Integrated (07/24/2023)   Social Connection and Isolation Panel    Frequency of Communication with Friends and Family: More than three times a week    Frequency of Social Gatherings with Friends and Family: Once a week    Attends Religious Services: More than 4 times per year    Active Member of Clubs or Organizations: No    Attends Banker Meetings: Never    Marital Status: Living with partner  Intimate Partner Violence: Not At Risk (01/05/2024)   Epic    Fear of Current or Ex-Partner: No    Emotionally Abused: No    Physically Abused: No    Sexually Abused: No  Depression (PHQ2-9): Low Risk (02/13/2024)   Depression (PHQ2-9)    PHQ-2 Score: 0  Alcohol Screen: Low Risk (07/24/2023)   Alcohol Screen    Last Alcohol Screening Score (AUDIT): 2  Housing: Low Risk (02/16/2024)   Epic    Unable to Pay for Housing in the Last Year: No    Number of Times Moved in the Last Year: 0    Homeless in the Last Year: No  Utilities: Not At Risk (01/05/2024)   Epic    Threatened with loss of utilities: No  Health Literacy: Adequate Health Literacy (07/24/2023)   B1300 Health Literacy    Frequency of need for help with medical instructions: Never     RELEVANT GI HISTORY, IMAGING AND LABS: CBC    Component Value Date/Time   WBC 6.0 03/25/2024 1409   WBC 10.8  (H) 09/16/2023 1300   RBC 3.40 (L) 03/25/2024 1409   HGB 11.5 (L) 03/25/2024 1409   HGB 12.1 (L) 12/03/2020 1414   HCT 32.4 (L) 03/25/2024 1409   HCT 36.3 (L) 12/03/2020 1414   PLT 73 (L) 03/25/2024 1409   PLT 95 (LL) 12/03/2020 1414   MCV 95.3 03/25/2024 1409   MCV 95 12/03/2020 1414   MCV 92 02/22/2013 1654   MCH 33.8 03/25/2024 1409   MCHC 35.5 03/25/2024 1409   RDW 12.1 03/25/2024 1409   RDW 12.5 12/03/2020 1414   RDW 13.3 02/22/2013 1654   LYMPHSABS 1.4 01/05/2024 1529   MONOABS 0.5 01/05/2024 1529   EOSABS 0.1 01/05/2024 1529   BASOSABS 0.0 01/05/2024 1529   Recent Labs    09/16/23 1300 01/05/24 1529 03/25/24 1409  HGB 11.2* 10.9* 11.5*    CMP     Component Value Date/Time   NA 137 12/12/2023 1438   NA 142 01/26/2021 1604   NA 139 02/22/2013 1654   K 4.7 12/12/2023 1438   K 4.5 02/22/2013 1654   CL 105 12/12/2023 1438   CL 109 (H) 02/22/2013 1654   CO2 24 12/12/2023 1438   CO2 24 02/22/2013 1654   GLUCOSE 212 (H) 12/12/2023 1438   GLUCOSE 197 (H) 02/22/2013 1654   BUN 45 (H) 12/12/2023 1438   BUN  30 (H) 01/26/2021 1604   BUN 20 (H) 02/22/2013 1654   CREATININE 1.90 (H) 12/12/2023 1438   CREATININE 1.52 (H) 02/12/2021 1452   CALCIUM  9.2 12/12/2023 1438   CALCIUM  9.1 02/22/2013 1654   PROT 6.5 12/12/2023 1438   PROT 6.1 11/12/2020 1520   PROT 6.2 (L) 02/22/2013 1654   ALBUMIN 4.3 12/12/2023 1438   ALBUMIN 3.6 11/12/2020 1520   ALBUMIN 3.5 02/22/2013 1654   AST 17 12/12/2023 1438   AST 20 02/22/2013 1654   ALT 11 12/12/2023 1438   ALT 42 02/22/2013 1654   ALKPHOS 96 12/12/2023 1438   ALKPHOS 113 02/22/2013 1654   BILITOT 0.6 12/12/2023 1438   BILITOT 0.5 11/12/2020 1520   BILITOT 0.6 02/22/2013 1654   GFRNONAA 39 (L) 09/16/2023 1300   GFRNONAA 59 (L) 02/22/2013 1654   GFRAA 53 (L) 04/27/2020 0930   GFRAA >60 02/22/2013 1654      Latest Ref Rng & Units 12/12/2023    2:38 PM 09/16/2023    1:00 PM 11/08/2022    4:26 AM  Hepatic Function  Total  Protein 6.0 - 8.3 g/dL 6.5  5.8  6.1   Albumin 3.5 - 5.2 g/dL 4.3  3.4  3.5   AST 0 - 37 U/L 17  22  35   ALT 0 - 53 U/L 11  16  31    Alk Phosphatase 39 - 117 U/L 96  91  240   Total Bilirubin 0.2 - 1.2 mg/dL 0.6  1.2  0.9       Review of Systems   All systems reviewed and negative except where noted in HPI.    Physical Exam  There were no vitals taken for this visit. No LMP for male patient. General:   Alert and oriented. Pleasant and cooperative. Well-nourished and well-developed. In no acute distress.  Head:  Normocephalic and atraumatic. Eyes:  Without icterus Ears:  Normal auditory acuity. Neck:  Supple; no masses or thyromegaly. Lungs:  Respirations even and unlabored.  Clear throughout to auscultation.   No wheezes, crackles, or rhonchi. No acute distress. Heart:  Regular rate and rhythm; no murmurs, clicks, rubs, or gallops. Abdomen:  Normal bowel sounds.  No bruits.  Soft, non-tender and non-distended without masses, hepatosplenomegaly or hernias noted.  No guarding or rebound tenderness.  ***Negative Carnett sign.   Rectal:  Deferred.***  Msk:  Symmetrical without gross deformities. Normal posture. Extremities:  Without edema. Neurologic:  Alert and  oriented x4;  grossly normal neurologically. Skin:  Intact without significant lesions or rashes. Psych:  Alert and cooperative. Normal mood and affect.   Assessment & Plan   CADENCE HASLAM is a 89 y.o. male presenting today with    I discussed the assessment and treatment plan with the patient. The patient was provided an opportunity to ask questions and all were answered. The patient agreed with the plan and demonstrated an understanding of the instructions.   The patient was advised to call back or seek an in-person evaluation if the symptoms worsen or if the condition fails to improve as anticipated.  Grayce Bohr, DNP, AGNP-C [facility]  "

## 2024-03-29 ENCOUNTER — Other Ambulatory Visit: Payer: Self-pay | Admitting: *Deleted

## 2024-03-29 ENCOUNTER — Other Ambulatory Visit: Payer: Self-pay

## 2024-03-29 ENCOUNTER — Encounter: Payer: Self-pay | Admitting: *Deleted

## 2024-04-01 ENCOUNTER — Ambulatory Visit: Admitting: Family Medicine

## 2024-04-03 ENCOUNTER — Telehealth: Payer: Self-pay | Admitting: *Deleted

## 2024-04-03 ENCOUNTER — Telehealth: Payer: Self-pay | Admitting: Oncology

## 2024-04-03 NOTE — Telephone Encounter (Signed)
 He should contact his pcp first. If they can't see him, [facility] can see him

## 2024-04-03 NOTE — Telephone Encounter (Signed)
 Patients friend called and left a voicemail that he is having side/stomach pain, possible blood in urine. He has an appointment 3/17 and would like to know if he can be seen sooner. Please call [phone] to advise.

## 2024-04-04 NOTE — Telephone Encounter (Signed)
 Per Dr. Melanee [name] should contact his pcp first. If they can't see him, [facility] can see him.  Outbound call, spoke to spouse [name] [name] and informed of above.  Reviewed chart and mentioned it looks like patient had appointment Tues 1/27 w/ [name] at [facility]; spouse indicated that appointment was cancelled.  Spouse agreed to reach out to PCP first and in the event [name] cannot be seen to contact the center and we'll schedule him with [facility].

## 2024-04-23 ENCOUNTER — Ambulatory Visit: Admitting: Urology

## 2024-04-23 ENCOUNTER — Ambulatory Visit

## 2024-04-25 ENCOUNTER — Telehealth

## 2024-05-21 ENCOUNTER — Inpatient Hospital Stay

## 2024-05-21 ENCOUNTER — Inpatient Hospital Stay: Admitting: Oncology

## 2024-06-11 ENCOUNTER — Ambulatory Visit: Admitting: Internal Medicine

## 2024-07-24 ENCOUNTER — Ambulatory Visit
